# Patient Record
Sex: Female | Born: 1945
Health system: Southern US, Community
[De-identification: ages and names within clinical notes are randomized; demographics above are authoritative.]

## PROBLEM LIST (undated history)

## (undated) DIAGNOSIS — I350 Nonrheumatic aortic (valve) stenosis: Secondary | ICD-10-CM

## (undated) DIAGNOSIS — Z803 Family history of malignant neoplasm of breast: Secondary | ICD-10-CM

## (undated) DIAGNOSIS — F419 Anxiety disorder, unspecified: Secondary | ICD-10-CM

## (undated) DIAGNOSIS — E785 Hyperlipidemia, unspecified: Secondary | ICD-10-CM

## (undated) DIAGNOSIS — E559 Vitamin D deficiency, unspecified: Secondary | ICD-10-CM

## (undated) DIAGNOSIS — A6000 Herpesviral infection of urogenital system, unspecified: Secondary | ICD-10-CM

## (undated) DIAGNOSIS — M7120 Synovial cyst of popliteal space [Baker], unspecified knee: Secondary | ICD-10-CM

## (undated) DIAGNOSIS — M1990 Unspecified osteoarthritis, unspecified site: Secondary | ICD-10-CM

## (undated) DIAGNOSIS — K259 Gastric ulcer, unspecified as acute or chronic, without hemorrhage or perforation: Secondary | ICD-10-CM

## (undated) DIAGNOSIS — Z9981 Dependence on supplemental oxygen: Secondary | ICD-10-CM

## (undated) DIAGNOSIS — K439 Ventral hernia without obstruction or gangrene: Secondary | ICD-10-CM

## (undated) DIAGNOSIS — T4145XA Adverse effect of unspecified anesthetic, initial encounter: Secondary | ICD-10-CM

## (undated) DIAGNOSIS — M797 Fibromyalgia: Secondary | ICD-10-CM

## (undated) DIAGNOSIS — R7302 Impaired glucose tolerance (oral): Secondary | ICD-10-CM

## (undated) DIAGNOSIS — J189 Pneumonia, unspecified organism: Secondary | ICD-10-CM

## (undated) DIAGNOSIS — M8580 Other specified disorders of bone density and structure, unspecified site: Secondary | ICD-10-CM

## (undated) DIAGNOSIS — I10 Essential (primary) hypertension: Secondary | ICD-10-CM

## (undated) DIAGNOSIS — K7460 Unspecified cirrhosis of liver: Secondary | ICD-10-CM

## (undated) DIAGNOSIS — R748 Abnormal levels of other serum enzymes: Secondary | ICD-10-CM

## (undated) DIAGNOSIS — E039 Hypothyroidism, unspecified: Secondary | ICD-10-CM

## (undated) DIAGNOSIS — I2510 Atherosclerotic heart disease of native coronary artery without angina pectoris: Secondary | ICD-10-CM

## (undated) DIAGNOSIS — Z9109 Other allergy status, other than to drugs and biological substances: Secondary | ICD-10-CM

## (undated) DIAGNOSIS — Z8481 Family history of carrier of genetic disease: Secondary | ICD-10-CM

## (undated) DIAGNOSIS — B009 Herpesviral infection, unspecified: Secondary | ICD-10-CM

## (undated) DIAGNOSIS — J449 Chronic obstructive pulmonary disease, unspecified: Secondary | ICD-10-CM

## (undated) DIAGNOSIS — E119 Type 2 diabetes mellitus without complications: Secondary | ICD-10-CM

## (undated) DIAGNOSIS — G4730 Sleep apnea, unspecified: Secondary | ICD-10-CM

## (undated) DIAGNOSIS — R011 Cardiac murmur, unspecified: Secondary | ICD-10-CM

## (undated) DIAGNOSIS — K5790 Diverticulosis of intestine, part unspecified, without perforation or abscess without bleeding: Secondary | ICD-10-CM

## (undated) DIAGNOSIS — R0600 Dyspnea, unspecified: Secondary | ICD-10-CM

## (undated) DIAGNOSIS — K635 Polyp of colon: Secondary | ICD-10-CM

## (undated) DIAGNOSIS — C679 Malignant neoplasm of bladder, unspecified: Secondary | ICD-10-CM

## (undated) DIAGNOSIS — S83249A Other tear of medial meniscus, current injury, unspecified knee, initial encounter: Secondary | ICD-10-CM

## (undated) DIAGNOSIS — C50911 Malignant neoplasm of unspecified site of right female breast: Secondary | ICD-10-CM

## (undated) DIAGNOSIS — E7800 Pure hypercholesterolemia, unspecified: Secondary | ICD-10-CM

## (undated) HISTORY — DX: Herpesviral infection, unspecified: B00.9

## (undated) HISTORY — DX: Cardiac murmur, unspecified: R01.1

## (undated) HISTORY — DX: Family history of carrier of genetic disease: Z84.81

## (undated) HISTORY — DX: Essential (primary) hypertension: I10

## (undated) HISTORY — DX: Diverticulosis of intestine, part unspecified, without perforation or abscess without bleeding: K57.90

## (undated) HISTORY — DX: Gastric ulcer, unspecified as acute or chronic, without hemorrhage or perforation: K25.9

## (undated) HISTORY — DX: Polyp of colon: K63.5

## (undated) HISTORY — DX: Unspecified cirrhosis of liver: K74.60

## (undated) HISTORY — DX: Abnormal levels of other serum enzymes: R74.8

## (undated) HISTORY — DX: Herpesviral infection of urogenital system, unspecified: A60.00

## (undated) HISTORY — DX: Vitamin D deficiency, unspecified: E55.9

## (undated) HISTORY — DX: Pneumonia, unspecified organism: J18.9

## (undated) HISTORY — DX: Type 2 diabetes mellitus without complications: E11.9

## (undated) HISTORY — DX: Chronic obstructive pulmonary disease, unspecified: J44.9

## (undated) HISTORY — DX: Nonrheumatic aortic (valve) stenosis: I35.0

## (undated) HISTORY — DX: Impaired glucose tolerance (oral): R73.02

## (undated) HISTORY — PX: EYE SURGERY: SHX253

## (undated) HISTORY — DX: Other specified disorders of bone density and structure, unspecified site: M85.80

## (undated) HISTORY — PX: CATARACT EXTRACTION, BILATERAL: SHX1313

## (undated) HISTORY — DX: Family history of malignant neoplasm of breast: Z80.3

## (undated) HISTORY — PX: TYMPANOSTOMY TUBE PLACEMENT: SHX32

## (undated) HISTORY — PX: CARDIAC CATHETERIZATION: SHX172

## (undated) HISTORY — PX: HERNIA REPAIR: SHX51

## (undated) HISTORY — PX: COLONOSCOPY: SHX174

## (undated) HISTORY — DX: Pure hypercholesterolemia, unspecified: E78.00

---

## 1990-06-30 DIAGNOSIS — T8859XA Other complications of anesthesia, initial encounter: Secondary | ICD-10-CM

## 1990-06-30 DIAGNOSIS — C50911 Malignant neoplasm of unspecified site of right female breast: Secondary | ICD-10-CM

## 1990-06-30 HISTORY — PX: MASTECTOMY: SHX3

## 1990-06-30 HISTORY — DX: Malignant neoplasm of unspecified site of right female breast: C50.911

## 1990-06-30 HISTORY — DX: Other complications of anesthesia, initial encounter: T88.59XA

## 1990-06-30 HISTORY — PX: BREAST BIOPSY: SHX20

## 1998-02-20 ENCOUNTER — Encounter: Payer: Self-pay | Admitting: Family Medicine

## 1998-02-20 ENCOUNTER — Ambulatory Visit: Admission: RE | Admit: 1998-02-20 | Discharge: 1998-02-20 | Payer: Self-pay | Admitting: Family Medicine

## 2000-07-09 ENCOUNTER — Encounter: Payer: Self-pay | Admitting: Family Medicine

## 2000-07-09 ENCOUNTER — Encounter: Admission: RE | Admit: 2000-07-09 | Discharge: 2000-07-09 | Payer: Self-pay | Admitting: Family Medicine

## 2000-10-16 ENCOUNTER — Other Ambulatory Visit: Admission: RE | Admit: 2000-10-16 | Discharge: 2000-10-16 | Payer: Self-pay | Admitting: Family Medicine

## 2000-10-26 ENCOUNTER — Encounter: Admission: RE | Admit: 2000-10-26 | Discharge: 2000-10-26 | Payer: Self-pay | Admitting: Family Medicine

## 2000-10-26 ENCOUNTER — Encounter: Payer: Self-pay | Admitting: Family Medicine

## 2001-06-30 HISTORY — PX: UMBILICAL HERNIA REPAIR: SHX196

## 2001-06-30 HISTORY — PX: LAPAROSCOPIC CHOLECYSTECTOMY: SUR755

## 2001-07-12 ENCOUNTER — Encounter: Payer: Self-pay | Admitting: Family Medicine

## 2001-07-12 ENCOUNTER — Ambulatory Visit (HOSPITAL_COMMUNITY): Admission: RE | Admit: 2001-07-12 | Discharge: 2001-07-12 | Payer: Self-pay | Admitting: Family Medicine

## 2001-11-25 ENCOUNTER — Ambulatory Visit (HOSPITAL_COMMUNITY): Admission: RE | Admit: 2001-11-25 | Discharge: 2001-11-25 | Payer: Self-pay | Admitting: Gastroenterology

## 2001-11-25 ENCOUNTER — Encounter: Payer: Self-pay | Admitting: Gastroenterology

## 2001-12-06 ENCOUNTER — Encounter: Payer: Self-pay | Admitting: Surgery

## 2001-12-07 ENCOUNTER — Observation Stay (HOSPITAL_COMMUNITY): Admission: RE | Admit: 2001-12-07 | Discharge: 2001-12-07 | Payer: Self-pay | Admitting: Surgery

## 2001-12-07 ENCOUNTER — Encounter (INDEPENDENT_AMBULATORY_CARE_PROVIDER_SITE_OTHER): Payer: Self-pay

## 2001-12-15 ENCOUNTER — Encounter: Payer: Self-pay | Admitting: Surgery

## 2001-12-15 ENCOUNTER — Encounter: Admission: RE | Admit: 2001-12-15 | Discharge: 2001-12-15 | Payer: Self-pay | Admitting: Surgery

## 2002-02-11 ENCOUNTER — Ambulatory Visit (HOSPITAL_COMMUNITY): Admission: RE | Admit: 2002-02-11 | Discharge: 2002-02-11 | Payer: Self-pay | Admitting: Surgery

## 2002-04-11 ENCOUNTER — Encounter: Admission: RE | Admit: 2002-04-11 | Discharge: 2002-04-11 | Payer: Self-pay | Admitting: Family Medicine

## 2002-04-11 ENCOUNTER — Encounter: Payer: Self-pay | Admitting: Family Medicine

## 2002-07-18 ENCOUNTER — Encounter: Payer: Self-pay | Admitting: Family Medicine

## 2002-07-18 ENCOUNTER — Ambulatory Visit (HOSPITAL_COMMUNITY): Admission: RE | Admit: 2002-07-18 | Discharge: 2002-07-18 | Payer: Self-pay | Admitting: Family Medicine

## 2002-08-29 ENCOUNTER — Ambulatory Visit (HOSPITAL_COMMUNITY): Admission: RE | Admit: 2002-08-29 | Discharge: 2002-08-29 | Payer: Self-pay | Admitting: Gastroenterology

## 2002-11-28 ENCOUNTER — Encounter: Payer: Self-pay | Admitting: Neurosurgery

## 2002-11-28 ENCOUNTER — Encounter: Payer: Self-pay | Admitting: Radiology

## 2002-11-28 ENCOUNTER — Encounter: Admission: RE | Admit: 2002-11-28 | Discharge: 2002-11-28 | Payer: Self-pay | Admitting: Neurosurgery

## 2002-12-13 ENCOUNTER — Encounter: Admission: RE | Admit: 2002-12-13 | Discharge: 2002-12-13 | Payer: Self-pay | Admitting: Neurosurgery

## 2002-12-13 ENCOUNTER — Encounter: Payer: Self-pay | Admitting: Neurosurgery

## 2003-02-28 ENCOUNTER — Encounter: Admission: RE | Admit: 2003-02-28 | Discharge: 2003-02-28 | Payer: Self-pay | Admitting: Neurosurgery

## 2003-02-28 ENCOUNTER — Encounter: Payer: Self-pay | Admitting: Neurosurgery

## 2003-07-27 ENCOUNTER — Ambulatory Visit (HOSPITAL_COMMUNITY): Admission: RE | Admit: 2003-07-27 | Discharge: 2003-07-27 | Payer: Self-pay | Admitting: Family Medicine

## 2003-12-15 ENCOUNTER — Other Ambulatory Visit: Admission: RE | Admit: 2003-12-15 | Discharge: 2003-12-15 | Payer: Self-pay | Admitting: Family Medicine

## 2004-08-06 ENCOUNTER — Ambulatory Visit (HOSPITAL_COMMUNITY): Admission: RE | Admit: 2004-08-06 | Discharge: 2004-08-06 | Payer: Self-pay | Admitting: Family Medicine

## 2004-12-25 ENCOUNTER — Other Ambulatory Visit: Admission: RE | Admit: 2004-12-25 | Discharge: 2004-12-25 | Payer: Self-pay | Admitting: Family Medicine

## 2005-01-10 ENCOUNTER — Encounter: Admission: RE | Admit: 2005-01-10 | Discharge: 2005-04-10 | Payer: Self-pay | Admitting: Family Medicine

## 2005-08-07 ENCOUNTER — Ambulatory Visit (HOSPITAL_COMMUNITY): Admission: RE | Admit: 2005-08-07 | Discharge: 2005-08-07 | Payer: Self-pay | Admitting: Family Medicine

## 2006-08-10 ENCOUNTER — Ambulatory Visit (HOSPITAL_COMMUNITY): Admission: RE | Admit: 2006-08-10 | Discharge: 2006-08-10 | Payer: Self-pay | Admitting: Family Medicine

## 2007-08-13 ENCOUNTER — Ambulatory Visit (HOSPITAL_COMMUNITY): Admission: RE | Admit: 2007-08-13 | Discharge: 2007-08-13 | Payer: Self-pay | Admitting: Family Medicine

## 2008-08-22 ENCOUNTER — Ambulatory Visit (HOSPITAL_COMMUNITY): Admission: RE | Admit: 2008-08-22 | Discharge: 2008-08-22 | Payer: Self-pay | Admitting: Family Medicine

## 2009-05-25 ENCOUNTER — Encounter: Admission: RE | Admit: 2009-05-25 | Discharge: 2009-05-25 | Payer: Self-pay | Admitting: Family Medicine

## 2009-06-28 ENCOUNTER — Ambulatory Visit (HOSPITAL_COMMUNITY): Admission: RE | Admit: 2009-06-28 | Discharge: 2009-06-28 | Payer: Self-pay | Admitting: Gastroenterology

## 2009-08-23 ENCOUNTER — Ambulatory Visit (HOSPITAL_COMMUNITY): Admission: RE | Admit: 2009-08-23 | Discharge: 2009-08-23 | Payer: Self-pay | Admitting: Family Medicine

## 2010-05-30 LAB — HM PAP SMEAR: HM Pap smear: NORMAL

## 2010-07-18 ENCOUNTER — Other Ambulatory Visit (HOSPITAL_COMMUNITY): Payer: Self-pay | Admitting: Family Medicine

## 2010-07-18 DIAGNOSIS — M8580 Other specified disorders of bone density and structure, unspecified site: Secondary | ICD-10-CM

## 2010-07-18 DIAGNOSIS — Z0000 Encounter for general adult medical examination without abnormal findings: Secondary | ICD-10-CM

## 2010-07-18 DIAGNOSIS — Z1231 Encounter for screening mammogram for malignant neoplasm of breast: Secondary | ICD-10-CM

## 2010-07-31 LAB — HM MAMMOGRAPHY: HM Mammogram: NORMAL

## 2010-07-31 LAB — HM DEXA SCAN: HM Dexa Scan: NORMAL

## 2010-08-14 ENCOUNTER — Institutional Professional Consult (permissible substitution) (INDEPENDENT_AMBULATORY_CARE_PROVIDER_SITE_OTHER): Payer: BC Managed Care – PPO | Admitting: Family Medicine

## 2010-08-14 DIAGNOSIS — L0291 Cutaneous abscess, unspecified: Secondary | ICD-10-CM

## 2010-08-14 DIAGNOSIS — I10 Essential (primary) hypertension: Secondary | ICD-10-CM

## 2010-08-14 DIAGNOSIS — E119 Type 2 diabetes mellitus without complications: Secondary | ICD-10-CM

## 2010-08-14 DIAGNOSIS — E7800 Pure hypercholesterolemia, unspecified: Secondary | ICD-10-CM

## 2010-08-26 ENCOUNTER — Ambulatory Visit (HOSPITAL_COMMUNITY)
Admission: RE | Admit: 2010-08-26 | Discharge: 2010-08-26 | Disposition: A | Discharge status: Home / Self Care | Payer: BC Managed Care – PPO | Source: Ambulatory Visit | Attending: Family Medicine | Admitting: Family Medicine

## 2010-08-26 DIAGNOSIS — M8580 Other specified disorders of bone density and structure, unspecified site: Secondary | ICD-10-CM

## 2010-08-26 DIAGNOSIS — Z1231 Encounter for screening mammogram for malignant neoplasm of breast: Secondary | ICD-10-CM | POA: Insufficient documentation

## 2010-08-29 LAB — HM COLONOSCOPY: HM Colonoscopy: NORMAL

## 2010-10-19 ENCOUNTER — Encounter: Payer: Self-pay | Admitting: Family Medicine

## 2010-11-08 ENCOUNTER — Ambulatory Visit: Payer: BC Managed Care – PPO

## 2010-11-08 DIAGNOSIS — R7302 Impaired glucose tolerance (oral): Secondary | ICD-10-CM

## 2010-11-08 LAB — HEPATIC FUNCTION PANEL
ALT: 34 U/L (ref 0–35)
AST: 37 U/L (ref 0–37)
Albumin: 4.2 g/dL (ref 3.5–5.2)
Alkaline Phosphatase: 54 U/L (ref 39–117)
Bilirubin, Direct: 0.2 mg/dL (ref 0.0–0.3)
Indirect Bilirubin: 0.5 mg/dL (ref 0.0–0.9)
Total Bilirubin: 0.7 mg/dL (ref 0.3–1.2)
Total Protein: 6.8 g/dL (ref 6.0–8.3)

## 2010-11-08 LAB — HEMOGLOBIN A1C
Hgb A1c MFr Bld: 6 % — ABNORMAL HIGH (ref <5.7)
Mean Plasma Glucose: 126 mg/dL — ABNORMAL HIGH (ref <117)

## 2010-11-08 LAB — LIPID PANEL
Cholesterol: 135 mg/dL (ref 0–200)
HDL: 37 mg/dL — ABNORMAL LOW (ref >39)
LDL Cholesterol: 75 mg/dL (ref 0–99)
Total CHOL/HDL Ratio: 3.6 Ratio
Triglycerides: 116 mg/dL (ref <150)
VLDL: 23 mg/dL (ref 0–40)

## 2010-11-08 LAB — GLUCOSE, RANDOM: Glucose, Bld: 139 mg/dL — ABNORMAL HIGH (ref 70–99)

## 2010-11-09 LAB — MICROALBUMIN / CREATININE URINE RATIO
Creatinine, Urine: 47.1 mg/dL
Microalb Creat Ratio: 10.6 mg/g (ref 0.0–30.0)
Microalb, Ur: 0.5 mg/dL (ref 0.00–1.89)

## 2010-11-11 ENCOUNTER — Telehealth: Payer: Self-pay | Admitting: Family Medicine

## 2010-11-11 ENCOUNTER — Other Ambulatory Visit: Payer: Self-pay | Admitting: *Deleted

## 2010-11-11 MED ORDER — BISOPROLOL-HYDROCHLOROTHIAZIDE 10-6.25 MG PO TABS: 1.0000 | ORAL_TABLET | Freq: Every day | ORAL | Status: DC

## 2010-11-11 MED ORDER — SIMVASTATIN 20 MG PO TABS: 20.0000 mg | ORAL_TABLET | Freq: Every day | ORAL | Status: DC

## 2010-11-11 NOTE — Telephone Encounter (Signed)
Pt is requesting refill on Simvistatin 20 mg qd    And Bisoprolol - HCTZ  10/6.25 qd    Wants 90 day supply called in to [facility].

## 2010-11-13 ENCOUNTER — Encounter: Payer: Self-pay | Admitting: Family Medicine

## 2010-11-13 ENCOUNTER — Ambulatory Visit (INDEPENDENT_AMBULATORY_CARE_PROVIDER_SITE_OTHER): Payer: BC Managed Care – PPO | Admitting: Family Medicine

## 2010-11-13 ENCOUNTER — Ambulatory Visit: Payer: BC Managed Care – PPO | Admitting: Family Medicine

## 2010-11-13 VITALS — BP 132/70 | HR 68 | Ht 63.5 in | Wt 226.0 lb

## 2010-11-13 DIAGNOSIS — F17200 Nicotine dependence, unspecified, uncomplicated: Secondary | ICD-10-CM

## 2010-11-13 DIAGNOSIS — E559 Vitamin D deficiency, unspecified: Secondary | ICD-10-CM

## 2010-11-13 DIAGNOSIS — E119 Type 2 diabetes mellitus without complications: Secondary | ICD-10-CM

## 2010-11-13 DIAGNOSIS — E7800 Pure hypercholesterolemia, unspecified: Secondary | ICD-10-CM | POA: Insufficient documentation

## 2010-11-13 DIAGNOSIS — I10 Essential (primary) hypertension: Secondary | ICD-10-CM | POA: Insufficient documentation

## 2010-11-13 NOTE — Progress Notes (Addendum)
Subjective:    Patient ID: Shannon Schultz, female    DOB: 1945-08-26, 65 y.o.   MRN: [id number]  HPI Diabetes follow-up:  Blood sugars at home are running 120's-140 in the mornings.  Denies hypoglycemia.  Denies polydipsia and polyuria.  Last eye exam was 2 months ago.  Patient follows a low sugar diet and checks feet regularly without concerns.   Bought Weight Watchers books, but things have changed and plans to look into restarting the program.  Not getting regular exercise. Has lost 6 pounds since last visit 3 months ago.  Hypertension follow-up:  Blood pressures are not checked elsewhere.  Denies dizziness, headaches, chest pain.  Denies side effects of medications.  Past Medical History  Diagnosis Date  . Allergy   . Elevated cholesterol   . Hypertension   . Genital HSV   . Diverticulosis     Past Surgical History  Procedure Date  . R breast cancer mastectomy   . Hernia repair   . Colonoscopy 2009    History   Social History  . Marital Status: Married    Spouse Name: N/A    Number of Children: N/A  . Years of Education: N/A   Occupational History  . Not on file.   Social History Main Topics  . Smoking status: Current Everyday Smoker -- 1.0 packs/day for 46 years  . Smokeless tobacco: Not on file  . Alcohol Use: Yes     rarely, 1 drink per year maybe  . Drug Use: No  . Sexually Active: Not on file   Other Topics Concern  . Not on file   Social History Narrative  . No narrative on file    No family history on file.  Current outpatient prescriptions:aspirin 81 MG tablet, Take 81 mg by mouth daily.  , Disp: , Rfl: ;  bisoprolol-hydrochlorothiazide (ZIAC) 10-6.25 MG per tablet, Take 1 tablet by mouth daily., Disp: 90 tablet, Rfl: 0;  calcium carbonate (OS-CAL - DOSED IN MG OF ELEMENTAL CALCIUM) 1250 MG tablet, Take 2 tablets by mouth daily.  , Disp: , Rfl: ;  cetirizine (ZYRTEC) 10 MG tablet, Take 10 mg by mouth daily.  , Disp: , Rfl:  Cholecalciferol (VITAMIN D)  1000 UNITS capsule, Take 2,000 Units by mouth daily.  , Disp: , Rfl: ;  Multiple Vitamins-Minerals (CENTRUM SILVER PO), Take 1 tablet by mouth.  , Disp: , Rfl: ;  Omega-3 Fatty Acids (FISH OIL) 1200 MG CAPS, Take 4 capsules by mouth daily.  , Disp: , Rfl: ;  Probiotic Product (HEALTHY COLON PO), Take 1 tablet by mouth daily.  , Disp: , Rfl:  simvastatin (ZOCOR) 20 MG tablet, Take 1 tablet (20 mg total) by mouth at bedtime., Disp: 90 tablet, Rfl: 0;  valACYclovir (VALTREX) 500 MG tablet, Take 500 mg by mouth daily.  , Disp: , Rfl: ;  DISCONTD: valACYclovir (VALTREX) 1000 MG tablet, Take 1,000 mg by mouth daily.  , Disp: , Rfl: ;  fluticasone (FLONASE) 50 MCG/ACT nasal spray, 2 sprays by Nasal route daily.  , Disp: , Rfl:  DISCONTD: fish oil-omega-3 fatty acids 1000 MG capsule, Take 1,200 mg by mouth daily. , Disp: , Rfl:   Allergies  Allergen Reactions  . Iohexol      Desc: lipitore, sulfate   . Levaquin Other (See Comments)    insomnia  . Sulfa Antibiotics Other (See Comments)    Historical from mother, pt states that mother says she almost died from this drug  .  Codeine Nausea Only, Anxiety and Other (See Comments)    insomnia  . Lipitor (Atorvastatin Calcium) Rash    Review of Systems Denies chest pain, headaches, palpitations, paresthesias, URI symptoms, Cough, SOB, GI or GU complaints, skin concerns or other problems    Objective:   Physical Exam BP 132/70  Pulse 68  Ht 5' 3.5" (1.613 m)  Wt 226 lb (102.513 kg)  BMI 39.41 kg/m2  Well developed, well nourished patient, in no distress BP 132/70  Pulse 68  Ht 5' 3.5" (1.613 m)  Wt 226 lb (102.513 kg)  BMI 39.41 kg/m2 Neck: No lymphadenopathy or thyromegaly, no carotid bruit Heart:  Regular rate and rhythm, no murmurs, rubs, gallops or ectopy Lungs:  Clear bilaterally, without wheezes, rales or ronchi Abdomen:  Soft, nontender, nondistended, no hepatosplenomegaly or masses, normal bowel sounds Extremities:  No clubbing,  cyanosis or edema, 2+ pulses.  Neuro:  Alert and oriented x 3, cranial nerves grossly intact.  DTR's 2+ and symmetric.  Normal strength and sensation Back:  No spine or CVA tenderness Skin: no rashes or suspicious lesions Psych:  Normal mood, affect, hygiene and grooming, normal speech, eye contact  Lab Results  Component Value Date   CHOL 135 11/08/2010   TRIG 116 11/08/2010   HDL 37* 11/08/2010   ALT 34 11/08/2010   AST 37 11/08/2010   HGBA1C 6.0* 11/08/2010   MICROALBUR 0.50 11/08/2010        Assessment & Plan:   1. Pure hypercholesterolemia  Lipid panel  2. Type II or unspecified type diabetes mellitus without mention of complication, not stated as uncontrolled  CBC with Differential, Hemoglobin A1c, Comprehensive metabolic panel  3. Essential hypertension, benign  CBC with Differential  4. Tobacco use disorder    5. Unspecified vitamin D deficiency  Vitamin D 25 hydroxy   Patient was encouraged to quit smoking.  Discussed risks of smoking.  Instructed to start thinking about why/when patient smokes in order to come up with effective strategies to cut back or quit. Discussed available resources, including free counseling through [place] [facility], smoking cessation classes through [facility], OTC nicotine replacements, and the possibility of assistance with prescription medication if patients own strategies fail and if patient is motivated to quit.

## 2010-11-15 NOTE — Op Note (Signed)
[facility]  Patient:    Shannon, Schultz Visit Number: [id number] MRN: [id number]          Service Type: SUR Location: 3W [id number] Attending Physician:  Shelly Rubenstein Dictated by:   Abigail Miyamoto, M.D. Proc. Date: 12/07/01 Admit Date:  12/07/2001 Discharge Date: 12/07/2001                             Operative Report  PREOPERATIVE DIAGNOSES: 1. Biliary dyskinesia. 2. Umbilical hernia.  POSTOPERATIVE DIAGNOSES: 1. Biliary dyskinesia with chronic cholecystitis. 2. Umbilical hernia.  OPERATION: 1. Laparoscopic cholecystectomy. 2. Umbilical hernia repair.  SURGEON:  Abigail Miyamoto, M.D.  ASSISTANT:  Rose Phi. Maple Hudson, M.D.  ANESTHESIA:   General endotracheal anesthesia.  ESTIMATED BLOOD LOSS:  Minimal  DESCRIPTION OF PROCEDURE:  The patient was brought to the operating room and identified as Shannon Schultz.  She was placed supine on the operating room table and general anesthesia was induced.  Her abdomen was then prepped and draped in the usual sterile fashion.  Using a #15 blade a small incision was made transversely above the umbilicus.  Incision was carried down to the hernia sac which was easily identified and transected with the electrocautery.  This opened up the entrance to the peritoneal cavity.  A 0 Vicryl pursestring suture was then placed around this opening.  The Hasson port was placed through the opening and insufflation of the abdomen was begun.  Next an 11 mm port was placed in the patients epigastrium and two 5 mm ports were placed in the patients right flank under direct vision.  The gallbladder was then identified and retracted from the liver bed.  Multiple adhesions were found to be tethering to the gallbladder which appeared chronically infected. These were taken down bluntly.  The cystic duct was finally dissected out and clipped three times proximally and once distally and transected with scissors.  The cystic artery  was again identified and clipped twice proximally and once distally and transected as well.  The gallbladder was then slowly dissected free from the liver bed with the electrocautery.  The gallbladder was then completely excised from the liver bed.  Hemostasis was achieved in the liver bed with the cautery.  The gallbladder was then removed through the incision at the umbilicus.  A small adhesive band of omentum to the abdominal wall was transected with the scissors.  At this point, the abdomen was again copiously irrigated with normal saline. Hemostasis appeared to be achieved.  The 0 Vicryl at the umbilicus was then closed in place closing the fascial defect. Next a #1 Prolene suture was also used to reinforce the umbilical repair closing the hernial defect.  The skin of the umbilicus was then tacked in place with a 3-0 Vicryl suture.  All incisions were then anesthetized with 0.25% Marcaine and closed with 4-0 Monocryl subcuticular suture.  Sterile strips, gauze and tape were then applied.  The patient tolerated procedure well.  All sponge, needle and instrument counts were correct at the end of the procedure.  The patient was then extubated in the operating room and taken in stable condition to the recovery room. Dictated by:   Abigail Miyamoto, M.D. Attending Physician:  Shelly Rubenstein DD:  12/07/01 TD:  12/09/01 Job: [id number]

## 2010-11-15 NOTE — Op Note (Signed)
Shannon Schultz, [name]                            ACCOUNT NO.:  [id number]   MEDICAL RECORD NO.:  [id number]                   PATIENT TYPE:  AMB   LOCATION:  DAY                                  FACILITY:  [facility]   PHYSICIAN:  Douglas A. Magnus Ivan, M.D.           DATE OF BIRTH:  April 01, 1946   DATE OF PROCEDURE:  02/11/2002  DATE OF DISCHARGE:  02/11/2002                                 OPERATIVE REPORT   PREOPERATIVE DIAGNOSIS:  Umbilical hernia.   POSTOPERATIVE DIAGNOSIS:  Umbilical hernia.   PROCEDURE:  Umbilical hernia repair with mesh.   SURGEON:  Douglas A. Magnus Ivan, M.D.   ANESTHESIA:  General endotracheal anesthesia and 0.25% Marcaine.   ESTIMATED BLOOD LOSS:  Minimal.   FINDINGS:  The patient was found to have an umbilical hernia that was above  and separate from the incision made in the fascia for the laparoscopic  cholecystectomy.  The hernia was repaired with a piece of 6 x 6 cm Bard  Ventralex hernia patch mesh.   PROCEDURE IN DETAIL:  The patient was brought to the operating room and  identified correctly.  She was placed supine on the operating table, and  general anesthesia was induced.  Her abdomen was then prepped and draped in  the usual sterile fashion.  Using a #15 blade, a small transverse incision  made above the umbilicus through the patient's previous incision.  The  incision was carried down through to the previous fascial closure, and the  Prolene stitch was identified and found to be intact with no fascial defect  here.  The suture was removed, opening up the fascia.  A finger was inserted  into the abdominal cavity, and some adhesions were bluntly taken down.  The  patient was then found to have  fascial defect superior to this fascia  opening above the umbilicus.  The fascia was identified beneath this, and  the skin was dissected up through the area of the defect, and the hernia sac  was excised, and this fascial defect was connected with the other  fascial  defect.  At this point, the subcutaneous tissue above the fascia was then  mobilized circumferentially.  A piece of 6.4 x 6.4 cm Bard Ventralex hernia  patch mesh was brought onto the field.  The mesh was placed down through the  fascial defect and then sewn and then underlay circumferentially to the  fascia with interrupted #1 Novofil pop off sutures.  The redundant fascia  over the top of the mesh was then reapproximated with the #1 Novofils as  well.  Excellent coverage of the hernia defect appeared to be achieved.  At  this point, the subcutaneous tissue and fascia and skin were then  anesthetized with 0.25% Marcaine with epinephrine.  The wound was then  irrigated with saline.  Subcutaneous tissue was then closed with 3-0 Vicryl  suture, and the skin was closed with  a running 4-0 Vicryl.  Steri-Strips,  gauze, and tape were then applied.  The patient tolerated the procedure  well.  All sponge, needle, and instrument counts were correct at the end of  the procedure.  The patient was then extubated in the operating room and  taken in stable condition to the recovery room.                                               Douglas A. Magnus Ivan, M.D.    DAB/MEDQ  D:  02/11/2002  T:  02/13/2002  Job:  [id number]

## 2010-11-15 NOTE — Op Note (Signed)
NAME:  Shannon Schultz, Shannon Schultz                           ACCOUNT NO.:  [id number]   MEDICAL RECORD NO.:  [id number]                   PATIENT TYPE:  AMB   LOCATION:  ENDO                                 FACILITY:  [facility]   PHYSICIAN:  Anselmo Rod, M.D.               DATE OF BIRTH:  January 27, 1946   DATE OF PROCEDURE:  08/29/2002  DATE OF DISCHARGE:                                 OPERATIVE REPORT   PROCEDURE:  Colonoscopy.   ENDOSCOPIST:  Anselmo Rod, M.D.   INSTRUMENT USED:  Olympus video colonoscope.   INDICATION FOR PROCEDURE:  Guaiac-positive stools and a personal history of  breast cancer in a 65 year old white female.  Rule out colonic polyps,  masses, etc.   PREPROCEDURE PREPARATION:  Informed consent was procured from the patient.  The patient was fasted for eight hours prior to the procedure and prepped  with a bottle of magnesium citrate and a gallon of NuLytely the night prior  to the procedure.   PREPROCEDURE PHYSICAL:  VITAL SIGNS:  The patient had stable vital signs.  NECK:  Supple.  CHEST:  Clear to auscultation.  S1, S2 regular.  ABDOMEN:  Soft with normal bowel sounds.   DESCRIPTION OF PROCEDURE:  The patient was placed in the left lateral  decubitus position and sedated with 40 mg of Demerol and 4 mg of Versed  intravenously.  Once the patient was adequately sedate and maintained on low-  flow oxygen and continuous cardiac monitoring, the Olympus video colonoscope  was advanced from the rectum to the cecum and terminal ileum  without  difficulty.  There was some residual stool in the colon.  The patient had  pandiverticulosis with more prominent changes in the left colon.  There were  only a few small scattered diverticula in the right colon.  A small internal  hemorrhoid was seen on retroflexion.  The terminal ileum appeared normal.  No masses or polyps were seen.   IMPRESSION:  1. Small, nonbleeding internal hemorrhoid.  2. Diverticulosis with more prominent  changes in the left colon.  3. A normal-appearing terminal ileum.  4. Some residual stool in the colon, very small lesions could have been     missed.   RECOMMENDATIONS:  1. A high-fiber diet with liberal fluid intake has been advocated for the     patient.  2.     Repeat guaiac testing will be done on an outpatient basis and further     recommendations made as needed.  3. Repeat colorectal cancer screening has been recommended in the next five     years considering the patient's personal history of breast cancer, unless     the patient develops any abnormal symptoms in the interim.  Anselmo Rod, M.D.    JNM/MEDQ  D:  08/29/2002  T:  08/29/2002  Job:  [id number]   cc:   Talmadge Coventry, M.D.  [address]  [place]  [place] [address]  Fax: [phone]

## 2010-12-20 ENCOUNTER — Ambulatory Visit (INDEPENDENT_AMBULATORY_CARE_PROVIDER_SITE_OTHER): Payer: BC Managed Care – PPO | Admitting: Family Medicine

## 2010-12-20 ENCOUNTER — Other Ambulatory Visit: Payer: Self-pay

## 2010-12-20 ENCOUNTER — Encounter: Payer: Self-pay | Admitting: Family Medicine

## 2010-12-20 VITALS — BP 140/80 | HR 76 | Temp 97.9°F | Ht 64.0 in | Wt 230.0 lb

## 2010-12-20 DIAGNOSIS — R05 Cough: Secondary | ICD-10-CM

## 2010-12-20 DIAGNOSIS — J209 Acute bronchitis, unspecified: Secondary | ICD-10-CM

## 2010-12-20 DIAGNOSIS — R059 Cough, unspecified: Secondary | ICD-10-CM

## 2010-12-20 MED ORDER — IPRATROPIUM BROMIDE 0.02 % IN SOLN: 0.5000 mg | RESPIRATORY_TRACT | Status: DC

## 2010-12-20 MED ORDER — IPRATROPIUM BROMIDE 0.02 % IN SOLN: 0.5000 mg | Freq: Once | RESPIRATORY_TRACT | Status: DC

## 2010-12-20 MED ORDER — METHYLPREDNISOLONE SODIUM SUCC 125 MG IJ SOLR: 125.0000 mg | Freq: Once | INTRAMUSCULAR | Status: DC

## 2010-12-20 MED ORDER — ALBUTEROL SULFATE HFA 108 (90 BASE) MCG/ACT IN AERS: 2.0000 | INHALATION_SPRAY | Freq: Four times a day (QID) | RESPIRATORY_TRACT | Status: DC | PRN

## 2010-12-20 MED ORDER — ALBUTEROL SULFATE (2.5 MG/3ML) 0.083% IN NEBU: 2.5000 mg | INHALATION_SOLUTION | Freq: Once | RESPIRATORY_TRACT | Status: DC

## 2010-12-20 MED ORDER — METHYLPREDNISOLONE SODIUM SUCC 125 MG IJ SOLR
125.0000 mg | Freq: Once | INTRAMUSCULAR | Status: AC
  Administered 2010-12-20: 125 mg via INTRAMUSCULAR

## 2010-12-20 MED ORDER — AZITHROMYCIN 250 MG PO TABS: ORAL_TABLET | ORAL | Status: AC

## 2010-12-20 MED ORDER — PREDNISONE 10 MG PO KIT: PACK | ORAL | Status: DC

## 2010-12-20 NOTE — Progress Notes (Signed)
SUBJECTIVE: Patient presents accompanied by her husband with complaint of shortness of breath x 2-3 days, much worse this morning.  Began with sore throat, burning in chest/bronchioles with breathing, and cough.  Also having head congestion, postnasal drip.  Initially thought it was allergic reaction to flowers.  Mucus from nose is clear.  Cough is dry.  L>R side of her face feels somewhat numb and tingly.  This morning has increased shortness of breath.  Only slight SOB yesterday.  Patient is a smoker. Hasn't smoked in the last 2 days though. Hasn't been sick in about 3 years.  Has used inhaler in the past.  Hasn't checked sugars in last few days.  Decreased appetite.  Some nausea.  No vomiting or diarrhea.  No skin rash.  Denies fevers, but has felt a little warm.  PHYSICAL EXAM: BP 140/80  Pulse 76  Temp(Src) 97.9 F (36.6 C) (Oral)  Ht 5\' 4"  (1.626 m)  Wt 230 lb (104.327 kg)  BMI 39.48 kg/m2 HEENT:  PERRL, EOMI, conjunctiva clear.  TM's and EAC's normal.  OP normal.  Nose--normal.  Sinuses nontender.  Neck:  No lymphadenopathy or mass. Lungs: Diminished breath sounds throughout. O2 sat 88%. After first nebulizer, some improvement in breath sounds, wheezes now audible.  Oxygen saturation still 89%. Patient felt significantly better after first treatment, but still had some dyspnea with exertion (walk to bathroom). Second albuterol/atrovent neb was then given, along with SoluMedrol 125mg .  She felt even better after this treatment.  Lungs had some improvement in air movement, although still diminished, still mild wheezing.  Patient was speaking comfortably in full sentences, stating she felt much better.  O2 saturation, however, had actually dropped to 85-86% Extremities:  No clubbing, cyanosis or edema, brisk capillary refill.   CXR--PA was very dark, even after two attempts--almost unable to read.  Could see some bronchial thickening and cuffing.  Lateral view also shows this. No infiltrate  seen.   ASSESSMENT/PLAN: 1. Cough  DG Chest 2 View, methylPREDNISolone sodium succinate (SOLU-MEDROL) injection 125 mg, albuterol (PROVENTIL) (2.5 MG/3ML) 0.083% nebulizer solution 2.5 mg, albuterol (PROVENTIL) (2.5 MG/3ML) 0.083% nebulizer solution 2.5 mg, ipratropium (ATROVENT) 0.02 % nebulizer solution 0.5 mg, ipratropium (ATROVENT) 0.02 % nebulizer solution 0.5 mg, DISCONTINUED: methylPREDNISolone sodium succinate (SOLU-MEDROL) 125 MG injection, DISCONTINUED: ipratropium (ATROVENT) 0.02 % nebulizer solution 0.5 mg  2. Bronchitis, acute, with bronchospasm  azithromycin (ZITHROMAX Z-PAK) 250 MG tablet, PredniSONE 10 MG KIT, albuterol (PROVENTIL HFA;VENTOLIN HFA) 108 (90 BASE) MCG/ACT inhaler, methylPREDNISolone sodium succinate (SOLU-MEDROL) injection 125 mg, albuterol (PROVENTIL) (2.5 MG/3ML) 0.083% nebulizer solution 2.5 mg, albuterol (PROVENTIL) (2.5 MG/3ML) 0.083% nebulizer solution 2.5 mg, ipratropium (ATROVENT) 0.02 % nebulizer solution 0.5 mg, ipratropium (ATROVENT) 0.02 % nebulizer solution 0.5 mg, DISCONTINUED: methylPREDNISolone sodium succinate (SOLU-MEDROL) 125 MG injection, DISCONTINUED: ipratropium (ATROVENT) 0.02 % nebulizer solution 0.5 mg   Patient and husband needed to get home to take his mother to the doctor.  Patient was extremely hesitant about going to ER (due to mother-in-law's appt, and she was feeling better).  Therefore, we decided that since she clinically felt better, she was in no distress, not tachypneic, she could go home rather than to ER.  She would start the Z-pak, and continue with albuterol q4-6 hours.  If she felt any continued SOB, she was to go to ER either tonight or this weekend.  If feeling better, she will follow-up next week.  Expect sugars to be high due to the steroids.  Clinically she seemed to improve--unclear  if oxygen saturation monitor was perhaps inaccurate.  Patient visit was over 1.5 hours

## 2010-12-24 ENCOUNTER — Telehealth: Payer: Self-pay | Admitting: Family Medicine

## 2010-12-24 NOTE — Telephone Encounter (Signed)
Pt called had an out of body experience with the new inhaler and chest also gotten tighter.  She stopped using that inhaler and would like to get the kind she used in the past which was PRO-AIR.  She cannot drive today, but her sister can pick rx up at lunch.  Please call in to [facility] [address].  Please let pt know [phone], needs asap if possible.

## 2010-12-24 NOTE — Telephone Encounter (Signed)
Called patient, she stated that the Ventolin was making her "nervous," and that [employer] the past worked well, I called in [employer] Inhaler #1 1-2 puffs q6hr-prn with 1 refill to [facility] [address], spoke with Alex(pharmacist). Patient also stated that she is NOT experiencing any chest tightness, only wheezing since late Sunday night. She said that she can take deep breaths and feels much much better than last Friday when she saw you. Explained to her that if she begins to feel badly again or experiences any chest tightness that you will be happy to see her but if her sats remain low that you will be sending her to the ER. Patient verbalized understanding.

## 2010-12-24 NOTE — Telephone Encounter (Signed)
Patient was supposed to follow up early this week, and I don't see any visit scheduled.  We had also discussed that if she didn't see any improvement, she was to go to ED.  At this point, she is on day 4 of steroids and ABX.  If she is feeling that bad/tight, she needs to go to ER (as we had discussed at her last visit).  I'm happy to see her here first, but if her sats remain low, I will be sending her to ED anyway (for additional treatments, and better CXR, possibly needing a CT scan of chest if not improving).  It is okay to call in the Pro-Air for her, but this does not take the place of making sure that she gets seen

## 2011-01-24 NOTE — Progress Notes (Signed)
close

## 2011-02-04 ENCOUNTER — Other Ambulatory Visit: Payer: Self-pay | Admitting: Family Medicine

## 2011-02-04 NOTE — Telephone Encounter (Signed)
Looks like Dr.knapp ordered it

## 2011-02-04 NOTE — Telephone Encounter (Signed)
Is this ok?

## 2011-03-26 NOTE — Progress Notes (Signed)
dt ?

## 2011-04-18 ENCOUNTER — Other Ambulatory Visit: Payer: Self-pay | Admitting: Family Medicine

## 2011-05-16 ENCOUNTER — Other Ambulatory Visit: Payer: BC Managed Care – PPO

## 2011-05-16 DIAGNOSIS — E7800 Pure hypercholesterolemia, unspecified: Secondary | ICD-10-CM

## 2011-05-16 DIAGNOSIS — I10 Essential (primary) hypertension: Secondary | ICD-10-CM

## 2011-05-16 DIAGNOSIS — E559 Vitamin D deficiency, unspecified: Secondary | ICD-10-CM

## 2011-05-16 DIAGNOSIS — E119 Type 2 diabetes mellitus without complications: Secondary | ICD-10-CM

## 2011-05-17 LAB — CBC WITH DIFFERENTIAL/PLATELET
Basophils Absolute: 0.1 10*3/uL (ref 0.0–0.1)
Basophils Relative: 1 % (ref 0–1)
Eosinophils Absolute: 0.2 10*3/uL (ref 0.0–0.7)
Eosinophils Relative: 2 % (ref 0–5)
HCT: 48.4 % — ABNORMAL HIGH (ref 36.0–46.0)
Hemoglobin: 15.8 g/dL — ABNORMAL HIGH (ref 12.0–15.0)
Lymphocytes Relative: 31 % (ref 12–46)
Lymphs Abs: 2.3 10*3/uL (ref 0.7–4.0)
MCH: 31.2 pg (ref 26.0–34.0)
MCHC: 32.6 g/dL (ref 30.0–36.0)
MCV: 95.7 fL (ref 78.0–100.0)
Monocytes Absolute: 0.4 10*3/uL (ref 0.1–1.0)
Monocytes Relative: 5 % (ref 3–12)
Neutro Abs: 4.5 10*3/uL (ref 1.7–7.7)
Neutrophils Relative %: 61 % (ref 43–77)
Platelets: 162 10*3/uL (ref 150–400)
RBC: 5.06 MIL/uL (ref 3.87–5.11)
RDW: 14.5 % (ref 11.5–15.5)
WBC: 7.4 10*3/uL (ref 4.0–10.5)

## 2011-05-17 LAB — HEMOGLOBIN A1C
Hgb A1c MFr Bld: 6 % — ABNORMAL HIGH (ref <5.7)
Mean Plasma Glucose: 126 mg/dL — ABNORMAL HIGH (ref <117)

## 2011-05-17 LAB — VITAMIN D 25 HYDROXY (VIT D DEFICIENCY, FRACTURES): Vit D, 25-Hydroxy: 69 ng/mL (ref 30–89)

## 2011-05-17 LAB — COMPREHENSIVE METABOLIC PANEL
ALT: 45 U/L — ABNORMAL HIGH (ref 0–35)
AST: 41 U/L — ABNORMAL HIGH (ref 0–37)
Albumin: 4.4 g/dL (ref 3.5–5.2)
Alkaline Phosphatase: 54 U/L (ref 39–117)
BUN: 19 mg/dL (ref 6–23)
CO2: 28 mEq/L (ref 19–32)
Calcium: 9.8 mg/dL (ref 8.4–10.5)
Chloride: 104 mEq/L (ref 96–112)
Creat: 0.81 mg/dL (ref 0.50–1.10)
Glucose, Bld: 122 mg/dL — ABNORMAL HIGH (ref 70–99)
Potassium: 4.9 mEq/L (ref 3.5–5.3)
Sodium: 142 mEq/L (ref 135–145)
Total Bilirubin: 0.5 mg/dL (ref 0.3–1.2)
Total Protein: 6.6 g/dL (ref 6.0–8.3)

## 2011-05-17 LAB — LIPID PANEL
Cholesterol: 128 mg/dL (ref 0–200)
HDL: 41 mg/dL (ref >39)
LDL Cholesterol: 68 mg/dL (ref 0–99)
Total CHOL/HDL Ratio: 3.1 Ratio
Triglycerides: 96 mg/dL (ref <150)
VLDL: 19 mg/dL (ref 0–40)

## 2011-05-28 ENCOUNTER — Encounter: Payer: Self-pay | Admitting: Family Medicine

## 2011-05-28 ENCOUNTER — Ambulatory Visit (INDEPENDENT_AMBULATORY_CARE_PROVIDER_SITE_OTHER): Payer: BC Managed Care – PPO | Admitting: Family Medicine

## 2011-05-28 DIAGNOSIS — F17200 Nicotine dependence, unspecified, uncomplicated: Secondary | ICD-10-CM

## 2011-05-28 DIAGNOSIS — E7800 Pure hypercholesterolemia, unspecified: Secondary | ICD-10-CM

## 2011-05-28 DIAGNOSIS — R748 Abnormal levels of other serum enzymes: Secondary | ICD-10-CM

## 2011-05-28 DIAGNOSIS — E119 Type 2 diabetes mellitus without complications: Secondary | ICD-10-CM

## 2011-05-28 DIAGNOSIS — Z0000 Encounter for general adult medical examination without abnormal findings: Secondary | ICD-10-CM

## 2011-05-28 DIAGNOSIS — E559 Vitamin D deficiency, unspecified: Secondary | ICD-10-CM | POA: Insufficient documentation

## 2011-05-28 DIAGNOSIS — I10 Essential (primary) hypertension: Secondary | ICD-10-CM

## 2011-05-28 DIAGNOSIS — Z23 Encounter for immunization: Secondary | ICD-10-CM

## 2011-05-28 LAB — POCT URINALYSIS DIPSTICK
Bilirubin, UA: NEGATIVE
Blood, UA: NEGATIVE
Glucose, UA: NEGATIVE
Ketones, UA: NEGATIVE
Leukocytes, UA: NEGATIVE
Nitrite, UA: NEGATIVE
Protein, UA: NEGATIVE
Spec Grav, UA: 1.015
Urobilinogen, UA: NEGATIVE
pH, UA: 5

## 2011-05-28 NOTE — Progress Notes (Signed)
Shannon Schultz is a 65 y.o. female who presents for a complete physical.  She has the following concerns: Hit her left foot on the edge of the dresser yesterday.  Didn't bother her too much during the day until last night.  Last night was in a lot of pain, started swelling and throbbing. Feeling better today  She is also here for a med check.  Had labs done last week.  Diabetes follow-up:  Blood sugars at home are running 110-130 (one time high was 157) in the mornings.  Denies hypoglycemia.  Denies polydipsia and polyuria.  Last eye exam was March 2012.  Patient follows a low sugar diet and checks feet regularly without concerns. Hyperlipidemia follow-up:  Patient is reportedly following a low-fat, low cholesterol diet.  Compliant with medications and denies medication side effects Hypertension:  Denies headaches, dizziness, chest pain, edema.  Compliant with her meds  Immunization History  Administered Date(s) Administered  . Influenza Split 05/28/2011  . Pneumococcal Conjugate 12/15/2004  . Tdap 02/23/2008  . Zoster 05/07/2010   Last Pap smear: 05/30/2010 Last mammogram: 07/2010 Last colonoscopy: 08/2010 Last DEXA: 07/2010 Dentist: once yearly Ophtho: 08/2010 Exercise:  Minimal.  She plans to join [employer] again  Past Medical History  Diagnosis Date  . Allergy   . Elevated cholesterol   . Hypertension   . Genital HSV     gets on hip  . Diverticulosis   . 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 Vitamin D deficiency disease   . Osteopenia   . Migraine   . Elevated liver enzymes     fatty liver per ultrasound per pt  . Impaired glucose tolerance   . Colon polyp     Past Surgical History  Procedure Date  . Mastectomy 1992    R breast for cancer  . Colonoscopy 2009, 08/2010    Dr. Loreta Ave  . Cholecystectomy 2003  . Hernia repair 2003    umbilical    History   Social History  . Marital Status: Married    Spouse Name: N/A    Number of Children: 3  . Years of Education: N/A    Occupational History  . Retired    Social History Main Topics  . Smoking status: Current Everyday Smoker -- 1.0 packs/day for 46 years  . Smokeless tobacco: Not on file   Comment: cut back to a little over 1/2 PPD  . Alcohol Use: Yes     rarely, 1 drink per year maybe  . Drug Use: No  . Sexually Active: Yes -- Female partner(s)   Other Topics Concern  . Not on file   Social History Narrative   Lives with her husband.  Children all live in [place] nearby. No pets    Family History  Problem Relation Age of Onset  . Heart disease Father   . Diabetes Father   . Hypertension Father   . Asthma Sister   . Heart disease Mother     tachycardia  . Asthma Sister   . Cancer Sister 79    breast cancer   Current Outpatient Prescriptions on File Prior to Visit  Medication Sig Dispense Refill  . aspirin 81 MG tablet Take 81 mg by mouth daily.        . bisoprolol-hydrochlorothiazide (ZIAC) 10-6.25 MG per tablet TAKE 1 TABLET DAILY  90 tablet  1  . cetirizine (ZYRTEC) 10 MG tablet Take 10 mg by mouth daily.        Marland Kitchen  Cholecalciferol (VITAMIN D) 1000 UNITS capsule Take 1,000 Units by mouth daily.       . Multiple Vitamins-Minerals (CENTRUM SILVER PO) Take 1 tablet by mouth.        . Omega-3 Fatty Acids (FISH OIL) 1200 MG CAPS Take 2 capsules by mouth daily.       . Probiotic Product (HEALTHY COLON PO) Take 1 tablet by mouth daily.        . simvastatin (ZOCOR) 20 MG tablet TAKE 1 TABLET AT BEDTIME  90 tablet  1  . fluticasone (FLONASE) 50 MCG/ACT nasal spray 2 sprays by Nasal route daily.         Current Facility-Administered Medications on File Prior to Visit  Medication Dose Route Frequency Provider Last Rate Last Dose  . albuterol (PROVENTIL) (2.5 MG/3ML) 0.083% nebulizer solution 2.5 mg  2.5 mg Nebulization Once Debbrah Alar, CMA      . albuterol (PROVENTIL) (2.5 MG/3ML) 0.083% nebulizer solution 2.5 mg  2.5 mg Nebulization Once Debbrah Alar, CMA      . ipratropium (ATROVENT) 0.02 %  nebulizer solution 0.5 mg  0.5 mg Nebulization Once Debbrah Alar, CMA      . ipratropium (ATROVENT) 0.02 % nebulizer solution 0.5 mg  0.5 mg Nebulization Once Debbrah Alar, CMA        Allergies  Allergen Reactions  . Iohexol Other (See Comments)    unknown Desc: lipitore, sulfate  . Levaquin Other (See Comments)    insomnia  . Sulfa Antibiotics Other (See Comments)    Historical from mother, pt states that mother says she almost died from this drug  . Codeine Nausea Only, Anxiety and Other (See Comments)    insomnia  . Lipitor (Atorvastatin Calcium) Rash   ROS:  The patient denies anorexia, fever, weight changes, headaches,  vision changes, decreased hearing, ear pain, sore throat, breast concerns, chest pain, palpitations, dizziness, syncope, dyspnea on exertion, cough, swelling, nausea, vomiting, diarrhea, constipation, abdominal pain, melena, hematochezia, indigestion/heartburn, hematuria, incontinence, dysuria, vaginal bleeding, discharge, odor or itch, genital lesions, joint pains (other than toe injury yesterday), numbness, tingling, weakness, tremor, suspicious skin lesions, depression, anxiety, abnormal bleeding/bruising, or enlarged lymph nodes.  PHYSICAL EXAMINATION: BP 120/68  Pulse 64  Ht 5\' 4"  (1.626 m)  Wt 236 lb (107.049 kg)  BMI 40.51 kg/m2  General Appearance:    Alert, cooperative, no distress, appears stated age  Head:    Normocephalic, without obvious abnormality, atraumatic  Eyes:    PERRL, conjunctiva/corneas clear, EOM's intact, fundi    benign  Ears:    Normal TM's and external ear canals  Nose:   Nares normal, mucosa normal, no drainage or sinus   tenderness  Throat:   Lips, mucosa, and tongue normal; teeth and gums normal  Neck:   Supple, no lymphadenopathy;  thyroid:  no   enlargement/tenderness/nodules; no carotid   bruit or JVD  Back:    Spine nontender, no curvature, ROM normal, no CVA     tenderness  Lungs:     Clear to auscultation bilaterally  without wheezes, rales or     ronchi; respirations unlabored  Chest Wall:    No tenderness or deformity   Heart:    Regular rate and rhythm, S1 and S2 normal, no murmur, rub   or gallop  Breast Exam:    No tenderness, masses, or nipple discharge or inversion.      No axillary lymphadenopathy.  Right breast is absent.  Abdomen:     Soft, non-tender,  nondistended, normoactive bowel sounds,    no masses, no hepatosplenomegaly. + abdominal obesity  Genitalia:    Normal external genitalia without lesions.  BUS and vagina normal; No cervical motion tenderness. No abnormal vaginal discharge.  Uterus and adnexa not enlarged, nontender, no masses.  Pap not performed  Rectal:    Normal tone, no masses or tenderness; guaiac negative stool  Extremities:   No clubbing, cyanosis. 1+ edema pretibially.  Minimal tenderness at 3rd toe at proximal phalanx.  No pain with palpation at MTP or with movement of toe. No significant STS noted  Pulses:   2+ and symmetric all extremities  Skin:   Skin color, texture, turgor normal, no rashes or lesions. Venous stasis changes bilateral lower legs with some prominent superficial veins in feet giving bluish discoloration.  Lymph nodes:   Cervical, supraclavicular, and axillary nodes normal  Neurologic:   CNII-XII intact, normal strength, sensation and gait; reflexes 2+ and symmetric throughout          Psych:   Normal mood, affect, hygiene and grooming.    Labs: Lab Results  Component Value Date   HGBA1C 6.0* 05/16/2011   Lab Results  Component Value Date   CHOL 128 05/16/2011   HDL 41 05/16/2011   LDLCALC 68 05/16/2011   TRIG 96 05/16/2011   CHOLHDL 3.1 05/16/2011     Chemistry      Component Value Date/Time   NA 142 05/16/2011 0844   K 4.9 05/16/2011 0844   CL 104 05/16/2011 0844   CO2 28 05/16/2011 0844   BUN 19 05/16/2011 0844   CREATININE 0.81 05/16/2011 0844      Component Value Date/Time   CALCIUM 9.8 05/16/2011 0844   ALKPHOS 54 05/16/2011 0844    AST 41* 05/16/2011 0844   ALT 45* 05/16/2011 0844   BILITOT 0.5 05/16/2011 0844     Lab Results  Component Value Date   WBC 7.4 05/16/2011   HGB 15.8* 05/16/2011   HCT 48.4* 05/16/2011   MCV 95.7 05/16/2011   PLT 162 05/16/2011   Vitamin D-OH 69  ASSESSMENT/PLAN:  1. Routine general medical examination at a health care facility  POCT Urinalysis Dipstick, Visual acuity screening  2. Need for prophylactic vaccination and inoculation against influenza  Flu vaccine greater than or equal to 3yo preservative free IM  3. Pure hypercholesterolemia    4. Type II or unspecified type diabetes mellitus without mention of complication, not stated as uncontrolled    5. Essential hypertension, benign    6. Tobacco use disorder    7. Vitamin D deficiency    8. Need for pneumococcal vaccination  Pneumococcal polysaccharide vaccine 23-valent greater than or equal to 2yo subcutaneous/IM   Discussed the need to quit smoking.  She is not yet ready, although she has been cutting back. Discussed the need to exercise daily, and to lose weight.  She plans to start [employer] with a friend, and to restart on the Weight [employer] program through her books.  Mildly elevated LFT's--patient states she had eval in past, and has fatty liver (LFT's were normal on last check here) Vitamin D deficiency--resolved.  Continue current supplements. HTN:  Well controlled Hyperlipidemia:  Controlled Diabetes--diet controlled  Discussed monthly self breast exams and yearly mammograms after the age of 36; at least 30 minutes of aerobic activity at least 5 days/week; proper sunscreen use reviewed; healthy diet, including goals of calcium and vitamin D intake and alcohol recommendations (less than or equal to 1 drink/day)  reviewed; regular seatbelt use; changing batteries in smoke detectors.  Immunization recommendations discussed--pneumovax and flu shot given today.  Colonoscopy recommendations reviewed--UTD.   F/u 6 months  with labs prior

## 2011-05-28 NOTE — Patient Instructions (Signed)

## 2011-06-04 ENCOUNTER — Telehealth: Payer: Self-pay | Admitting: Family Medicine

## 2011-06-04 MED ORDER — ALBUTEROL SULFATE HFA 108 (90 BASE) MCG/ACT IN AERS: 2.0000 | INHALATION_SPRAY | Freq: Four times a day (QID) | RESPIRATORY_TRACT | Status: DC | PRN

## 2011-06-04 NOTE — Telephone Encounter (Signed)
Advise pt inhaler Rx was sent.  If worsening symptoms, she will need appointment.  She must quit smoking!!!  Also, Mucinex may help with her chest congestion

## 2011-06-04 NOTE — Telephone Encounter (Signed)
Spoke with patient and let her know that her rx for ProAir was called into the pharmacy. Also let her know that she could try OTC Mucinex for the chest congestion and to schedule appointment if worsening symptoms, also advised patient to quit smoking!

## 2011-06-06 ENCOUNTER — Ambulatory Visit (INDEPENDENT_AMBULATORY_CARE_PROVIDER_SITE_OTHER): Payer: BC Managed Care – PPO | Admitting: Family Medicine

## 2011-06-06 ENCOUNTER — Other Ambulatory Visit: Payer: Self-pay

## 2011-06-06 ENCOUNTER — Other Ambulatory Visit (HOSPITAL_COMMUNITY): Payer: BC Managed Care – PPO

## 2011-06-06 ENCOUNTER — Emergency Department (HOSPITAL_COMMUNITY): Payer: BC Managed Care – PPO

## 2011-06-06 ENCOUNTER — Encounter: Payer: Self-pay | Admitting: Family Medicine

## 2011-06-06 ENCOUNTER — Encounter (HOSPITAL_COMMUNITY): Payer: Self-pay | Admitting: Emergency Medicine

## 2011-06-06 ENCOUNTER — Inpatient Hospital Stay (HOSPITAL_COMMUNITY)
Admission: EM | Admit: 2011-06-06 | Discharge: 2011-06-11 | Disposition: A | Discharge status: Home Health | Payer: BC Managed Care – PPO | Source: Ambulatory Visit | Attending: Internal Medicine | Admitting: Internal Medicine

## 2011-06-06 ENCOUNTER — Other Ambulatory Visit (HOSPITAL_COMMUNITY): Payer: Self-pay | Admitting: Respiratory Therapy

## 2011-06-06 ENCOUNTER — Ambulatory Visit (HOSPITAL_COMMUNITY)
Admission: RE | Admit: 2011-06-06 | Discharge: 2011-06-06 | Disposition: A | Discharge status: Home / Self Care | Payer: BC Managed Care – PPO | Source: Ambulatory Visit | Attending: Family Medicine | Admitting: Family Medicine

## 2011-06-06 VITALS — BP 120/76 | HR 106 | Temp 98.4°F | Wt 229.0 lb

## 2011-06-06 DIAGNOSIS — E119 Type 2 diabetes mellitus without complications: Secondary | ICD-10-CM

## 2011-06-06 DIAGNOSIS — Z720 Tobacco use: Secondary | ICD-10-CM

## 2011-06-06 DIAGNOSIS — E7800 Pure hypercholesterolemia, unspecified: Secondary | ICD-10-CM | POA: Diagnosis present

## 2011-06-06 DIAGNOSIS — E559 Vitamin D deficiency, unspecified: Secondary | ICD-10-CM | POA: Diagnosis present

## 2011-06-06 DIAGNOSIS — I10 Essential (primary) hypertension: Secondary | ICD-10-CM | POA: Diagnosis present

## 2011-06-06 DIAGNOSIS — R7402 Elevation of levels of lactic acid dehydrogenase (LDH): Secondary | ICD-10-CM | POA: Diagnosis present

## 2011-06-06 DIAGNOSIS — E871 Hypo-osmolality and hyponatremia: Secondary | ICD-10-CM | POA: Diagnosis present

## 2011-06-06 DIAGNOSIS — F411 Generalized anxiety disorder: Secondary | ICD-10-CM | POA: Diagnosis present

## 2011-06-06 DIAGNOSIS — J440 Chronic obstructive pulmonary disease with acute lower respiratory infection: Secondary | ICD-10-CM | POA: Diagnosis present

## 2011-06-06 DIAGNOSIS — F17200 Nicotine dependence, unspecified, uncomplicated: Secondary | ICD-10-CM | POA: Diagnosis present

## 2011-06-06 DIAGNOSIS — J9600 Acute respiratory failure, unspecified whether with hypoxia or hypercapnia: Secondary | ICD-10-CM | POA: Diagnosis present

## 2011-06-06 DIAGNOSIS — J209 Acute bronchitis, unspecified: Secondary | ICD-10-CM | POA: Diagnosis present

## 2011-06-06 DIAGNOSIS — J449 Chronic obstructive pulmonary disease, unspecified: Secondary | ICD-10-CM

## 2011-06-06 DIAGNOSIS — J189 Pneumonia, unspecified organism: Secondary | ICD-10-CM

## 2011-06-06 DIAGNOSIS — J441 Chronic obstructive pulmonary disease with (acute) exacerbation: Principal | ICD-10-CM | POA: Diagnosis present

## 2011-06-06 DIAGNOSIS — J9620 Acute and chronic respiratory failure, unspecified whether with hypoxia or hypercapnia: Secondary | ICD-10-CM | POA: Diagnosis present

## 2011-06-06 DIAGNOSIS — E785 Hyperlipidemia, unspecified: Secondary | ICD-10-CM | POA: Diagnosis present

## 2011-06-06 DIAGNOSIS — Z853 Personal history of malignant neoplasm of breast: Secondary | ICD-10-CM

## 2011-06-06 DIAGNOSIS — R0902 Hypoxemia: Secondary | ICD-10-CM | POA: Diagnosis present

## 2011-06-06 DIAGNOSIS — R7401 Elevation of levels of liver transaminase levels: Secondary | ICD-10-CM

## 2011-06-06 DIAGNOSIS — R0602 Shortness of breath: Secondary | ICD-10-CM

## 2011-06-06 DIAGNOSIS — R05 Cough: Secondary | ICD-10-CM

## 2011-06-06 DIAGNOSIS — Z9981 Dependence on supplemental oxygen: Secondary | ICD-10-CM

## 2011-06-06 DIAGNOSIS — R059 Cough, unspecified: Secondary | ICD-10-CM

## 2011-06-06 HISTORY — DX: Pneumonia, unspecified organism: J18.9

## 2011-06-06 LAB — COMPREHENSIVE METABOLIC PANEL
ALT: 69 U/L — ABNORMAL HIGH (ref 0–35)
AST: 86 U/L — ABNORMAL HIGH (ref 0–37)
Albumin: 3.8 g/dL (ref 3.5–5.2)
Alkaline Phosphatase: 52 U/L (ref 39–117)
BUN: 16 mg/dL (ref 6–23)
CO2: 29 mEq/L (ref 19–32)
Calcium: 9.1 mg/dL (ref 8.4–10.5)
Chloride: 91 mEq/L — ABNORMAL LOW (ref 96–112)
Creatinine, Ser: 0.68 mg/dL (ref 0.50–1.10)
GFR calc Af Amer: >90 mL/min (ref >90)
GFR calc non Af Amer: 90 mL/min — ABNORMAL LOW (ref >90)
Glucose, Bld: 133 mg/dL — ABNORMAL HIGH (ref 70–99)
Potassium: 4.2 mEq/L (ref 3.5–5.1)
Sodium: 129 mEq/L — ABNORMAL LOW (ref 135–145)
Total Bilirubin: 0.4 mg/dL (ref 0.3–1.2)
Total Protein: 7 g/dL (ref 6.0–8.3)

## 2011-06-06 LAB — BLOOD GAS, ARTERIAL
Acid-Base Excess: 2.1 mmol/L — ABNORMAL HIGH (ref 0.0–2.0)
Bicarbonate: 26.2 mEq/L — ABNORMAL HIGH (ref 20.0–24.0)
Drawn by: 12206
FIO2: 0.21 %
O2 Saturation: 75.3 %
Patient temperature: 98.6
TCO2: 27.5 mmol/L (ref 0–100)
pCO2 arterial: 41.7 mmHg (ref 35.0–45.0)
pH, Arterial: 7.415 — ABNORMAL HIGH (ref 7.350–7.400)
pO2, Arterial: 40.1 mmHg — ABNORMAL LOW (ref 80.0–100.0)

## 2011-06-06 LAB — DIFFERENTIAL
Basophils Absolute: 0 10*3/uL (ref 0.0–0.1)
Basophils Relative: 0 % (ref 0–1)
Eosinophils Absolute: 0 10*3/uL (ref 0.0–0.7)
Eosinophils Relative: 0 % (ref 0–5)
Lymphocytes Relative: 8 % — ABNORMAL LOW (ref 12–46)
Lymphs Abs: 0.7 10*3/uL (ref 0.7–4.0)
Monocytes Absolute: 0.7 10*3/uL (ref 0.1–1.0)
Monocytes Relative: 9 % (ref 3–12)
Neutro Abs: 6.8 10*3/uL (ref 1.7–7.7)
Neutrophils Relative %: 83 % — ABNORMAL HIGH (ref 43–77)

## 2011-06-06 LAB — URINE MICROSCOPIC-ADD ON

## 2011-06-06 LAB — URINALYSIS, ROUTINE W REFLEX MICROSCOPIC
Glucose, UA: NEGATIVE mg/dL
Hgb urine dipstick: NEGATIVE
Ketones, ur: 15 mg/dL — AB
Leukocytes, UA: NEGATIVE
Nitrite: NEGATIVE
Protein, ur: 30 mg/dL — AB
Specific Gravity, Urine: 1.026 (ref 1.005–1.030)
Urobilinogen, UA: 1 mg/dL (ref 0.0–1.0)
pH: 5.5 (ref 5.0–8.0)

## 2011-06-06 LAB — CBC WITH DIFFERENTIAL/PLATELET
HCT: 45.6 % (ref 36.0–46.0)
Hemoglobin: 15.2 g/dL — ABNORMAL HIGH (ref 12.0–15.0)
Lymphocytes Relative: 7 % — ABNORMAL LOW (ref 12–46)
Lymphs Abs: 0.6 10*3/uL — ABNORMAL LOW (ref 0.7–4.0)
MCH: 30.5 pg (ref 26.0–34.0)
MCHC: 33.3 g/dL (ref 30.0–36.0)
MCV: 91.3 fL (ref 78.0–100.0)
Monocytes Absolute: 0.3 10*3/uL (ref 0.1–1.0)
Monocytes Relative: 4 % (ref 3–12)
Neutro Abs: 6.8 10*3/uL (ref 1.7–7.7)
Neutrophils Relative %: 89 % — ABNORMAL HIGH (ref 43–77)
Platelets: 141 10*3/uL — ABNORMAL LOW (ref 150–400)
RBC: 4.99 MIL/uL (ref 3.87–5.11)
RDW: 14.3 % (ref 11.5–15.5)
WBC: 7.6 10*3/uL (ref 4.0–10.5)

## 2011-06-06 LAB — CBC
HCT: 44.5 % (ref 36.0–46.0)
HCT: 43.4 % (ref 36.0–46.0)
Hemoglobin: 15.5 g/dL — ABNORMAL HIGH (ref 12.0–15.0)
Hemoglobin: 15.2 g/dL — ABNORMAL HIGH (ref 12.0–15.0)
MCH: 31.4 pg (ref 26.0–34.0)
MCH: 31.1 pg (ref 26.0–34.0)
MCHC: 34.8 g/dL (ref 30.0–36.0)
MCHC: 35 g/dL (ref 30.0–36.0)
MCV: 90.1 fL (ref 78.0–100.0)
MCV: 88.9 fL (ref 78.0–100.0)
Platelets: 133 10*3/uL — ABNORMAL LOW (ref 150–400)
Platelets: 125 10*3/uL — ABNORMAL LOW (ref 150–400)
RBC: 4.94 MIL/uL (ref 3.87–5.11)
RBC: 4.88 MIL/uL (ref 3.87–5.11)
RDW: 14.5 % (ref 11.5–15.5)
RDW: 14.3 % (ref 11.5–15.5)
WBC: 8.2 10*3/uL (ref 4.0–10.5)
WBC: 6.1 10*3/uL (ref 4.0–10.5)

## 2011-06-06 LAB — CK TOTAL AND CKMB (NOT AT ARMC)
CK, MB: 4 ng/mL (ref 0.3–4.0)
Relative Index: 1.4 (ref 0.0–2.5)
Total CK: 278 U/L — ABNORMAL HIGH (ref 7–177)

## 2011-06-06 LAB — OSMOLALITY, URINE: Osmolality, Ur: 712 mOsm/kg (ref 390–1090)

## 2011-06-06 LAB — TSH: TSH: 1.087 u[IU]/mL (ref 0.350–4.500)

## 2011-06-06 LAB — GLUCOSE, CAPILLARY
Glucose-Capillary: 164 mg/dL — ABNORMAL HIGH (ref 70–99)
Glucose-Capillary: 214 mg/dL — ABNORMAL HIGH (ref 70–99)

## 2011-06-06 LAB — OSMOLALITY: Osmolality: 271 mOsm/kg — ABNORMAL LOW (ref 275–300)

## 2011-06-06 LAB — TROPONIN I: Troponin I: <0.3 ng/mL (ref <0.30)

## 2011-06-06 LAB — D-DIMER, QUANTITATIVE: D-Dimer, Quant: 0.41 ug/mL-FEU (ref 0.00–0.48)

## 2011-06-06 MED ORDER — DEXTROSE 5 % IV SOLN
500.0000 mg | INTRAVENOUS | Status: AC
  Administered 2011-06-07 – 2011-06-08 (×2): 500 mg via INTRAVENOUS
  Filled 2011-06-06 (×2): qty 500

## 2011-06-06 MED ORDER — VALACYCLOVIR HCL 500 MG PO TABS
500.0000 mg | ORAL_TABLET | Freq: Every day | ORAL | Status: DC
  Administered 2011-06-06: 500 mg via ORAL
  Filled 2011-06-06 (×2): qty 1

## 2011-06-06 MED ORDER — PREDNISONE 10 MG PO TABS
10.0000 mg | ORAL_TABLET | Freq: Every day | ORAL | Status: DC
  Administered 2011-06-07: 10 mg via ORAL
  Filled 2011-06-06 (×2): qty 1

## 2011-06-06 MED ORDER — DEXTROSE 5 % IV SOLN
1.0000 g | Freq: Once | INTRAVENOUS | Status: AC
  Administered 2011-06-06: 1 g via INTRAVENOUS
  Filled 2011-06-06: qty 10

## 2011-06-06 MED ORDER — ONDANSETRON HCL 4 MG/2ML IJ SOLN
4.0000 mg | Freq: Four times a day (QID) | INTRAMUSCULAR | Status: DC | PRN
  Administered 2011-06-09 – 2011-06-10 (×2): 4 mg via INTRAVENOUS
  Filled 2011-06-06 (×2): qty 2

## 2011-06-06 MED ORDER — SODIUM CHLORIDE 0.9 % IV SOLN
INTRAVENOUS | Status: DC
  Administered 2011-06-06 – 2011-06-07 (×4): via INTRAVENOUS

## 2011-06-06 MED ORDER — ONDANSETRON HCL 4 MG PO TABS: 4.0000 mg | ORAL_TABLET | Freq: Four times a day (QID) | ORAL | Status: DC | PRN

## 2011-06-06 MED ORDER — ACETAMINOPHEN 650 MG RE SUPP: 650.0000 mg | Freq: Four times a day (QID) | RECTAL | Status: DC | PRN

## 2011-06-06 MED ORDER — ALBUTEROL SULFATE (5 MG/ML) 0.5% IN NEBU
5.0000 mg | INHALATION_SOLUTION | Freq: Once | RESPIRATORY_TRACT | Status: AC
  Administered 2011-06-06: 5 mg via RESPIRATORY_TRACT
  Filled 2011-06-06: qty 1

## 2011-06-06 MED ORDER — CALCIUM CARBONATE-VITAMIN D 500-200 MG-UNIT PO TABS
2.0000 | ORAL_TABLET | Freq: Every day | ORAL | Status: DC
  Administered 2011-06-06 – 2011-06-11 (×6): 2 via ORAL
  Filled 2011-06-06 (×6): qty 2

## 2011-06-06 MED ORDER — THERA M PLUS PO TABS
1.0000 | ORAL_TABLET | Freq: Every day | ORAL | Status: DC
  Administered 2011-06-06 – 2011-06-11 (×6): 1 via ORAL
  Filled 2011-06-06 (×6): qty 1

## 2011-06-06 MED ORDER — MORPHINE SULFATE 2 MG/ML IJ SOLN: 2.0000 mg | INTRAMUSCULAR | Status: DC | PRN

## 2011-06-06 MED ORDER — DOCUSATE SODIUM 100 MG PO CAPS
100.0000 mg | ORAL_CAPSULE | Freq: Two times a day (BID) | ORAL | Status: DC
  Administered 2011-06-06 – 2011-06-11 (×9): 100 mg via ORAL
  Filled 2011-06-06 (×11): qty 1

## 2011-06-06 MED ORDER — OMEGA-3-ACID ETHYL ESTERS 1 G PO CAPS
1.0000 g | ORAL_CAPSULE | Freq: Every day | ORAL | Status: DC
  Administered 2011-06-06 – 2011-06-11 (×6): 1 g via ORAL
  Filled 2011-06-06 (×6): qty 1

## 2011-06-06 MED ORDER — INSULIN ASPART 100 UNIT/ML ~~LOC~~ SOLN
0.0000 [IU] | Freq: Three times a day (TID) | SUBCUTANEOUS | Status: DC
  Administered 2011-06-07: 2 [IU] via SUBCUTANEOUS
  Administered 2011-06-08: 1 [IU] via SUBCUTANEOUS
  Administered 2011-06-08: 2 [IU] via SUBCUTANEOUS
  Administered 2011-06-08: 3 [IU] via SUBCUTANEOUS
  Administered 2011-06-09: 1 [IU] via SUBCUTANEOUS
  Administered 2011-06-09: 2 [IU] via SUBCUTANEOUS
  Administered 2011-06-09: 1 [IU] via SUBCUTANEOUS
  Administered 2011-06-10 (×2): 2 [IU] via SUBCUTANEOUS
  Administered 2011-06-10 – 2011-06-11 (×3): 1 [IU] via SUBCUTANEOUS
  Filled 2011-06-06: qty 3

## 2011-06-06 MED ORDER — METHYLPREDNISOLONE SODIUM SUCC 125 MG IJ SOLR
125.0000 mg | Freq: Once | INTRAMUSCULAR | Status: AC
  Administered 2011-06-06: 125 mg via INTRAVENOUS
  Filled 2011-06-06: qty 2

## 2011-06-06 MED ORDER — DEXTROSE 5 % IV SOLN
1.0000 g | INTRAVENOUS | Status: DC
  Administered 2011-06-07 – 2011-06-09 (×3): 1 g via INTRAVENOUS
  Filled 2011-06-06 (×5): qty 10

## 2011-06-06 MED ORDER — INSULIN ASPART 100 UNIT/ML ~~LOC~~ SOLN: 0.0000 [IU] | Freq: Three times a day (TID) | SUBCUTANEOUS | Status: DC

## 2011-06-06 MED ORDER — PANTOPRAZOLE SODIUM 40 MG PO TBEC
40.0000 mg | DELAYED_RELEASE_TABLET | Freq: Every day | ORAL | Status: DC
  Administered 2011-06-06 – 2011-06-11 (×6): 40 mg via ORAL
  Filled 2011-06-06 (×6): qty 1

## 2011-06-06 MED ORDER — ZOLPIDEM TARTRATE 5 MG PO TABS
5.0000 mg | ORAL_TABLET | Freq: Every evening | ORAL | Status: DC | PRN
  Filled 2011-06-06: qty 1

## 2011-06-06 MED ORDER — AZITHROMYCIN 250 MG PO TABS
500.0000 mg | ORAL_TABLET | Freq: Once | ORAL | Status: AC
  Administered 2011-06-06: 500 mg via ORAL
  Filled 2011-06-06: qty 1

## 2011-06-06 MED ORDER — IPRATROPIUM BROMIDE 0.02 % IN SOLN
0.5000 mg | Freq: Four times a day (QID) | RESPIRATORY_TRACT | Status: DC
  Administered 2011-06-06: 0.5 mg via RESPIRATORY_TRACT
  Filled 2011-06-06: qty 2.5

## 2011-06-06 MED ORDER — IPRATROPIUM BROMIDE 0.02 % IN SOLN
0.5000 mg | Freq: Once | RESPIRATORY_TRACT | Status: AC
  Administered 2011-06-06: 0.5 mg via RESPIRATORY_TRACT
  Filled 2011-06-06: qty 2.5

## 2011-06-06 MED ORDER — ALBUTEROL SULFATE (5 MG/ML) 0.5% IN NEBU
2.5000 mg | INHALATION_SOLUTION | Freq: Four times a day (QID) | RESPIRATORY_TRACT | Status: DC
  Administered 2011-06-06: 2.5 mg via RESPIRATORY_TRACT
  Filled 2011-06-06: qty 0.5

## 2011-06-06 MED ORDER — BISOPROLOL-HYDROCHLOROTHIAZIDE 10-6.25 MG PO TABS
1.0000 | ORAL_TABLET | Freq: Every day | ORAL | Status: DC
  Administered 2011-06-07 – 2011-06-11 (×5): 1 via ORAL
  Filled 2011-06-06 (×5): qty 1

## 2011-06-06 MED ORDER — ENOXAPARIN SODIUM 40 MG/0.4ML ~~LOC~~ SOLN
40.0000 mg | SUBCUTANEOUS | Status: DC
  Administered 2011-06-06 – 2011-06-10 (×5): 40 mg via SUBCUTANEOUS
  Filled 2011-06-06 (×6): qty 0.4

## 2011-06-06 MED ORDER — NICOTINE 14 MG/24HR TD PT24
14.0000 mg | MEDICATED_PATCH | Freq: Every day | TRANSDERMAL | Status: DC
  Filled 2011-06-06 (×6): qty 1

## 2011-06-06 MED ORDER — ALBUTEROL SULFATE (5 MG/ML) 0.5% IN NEBU: 2.5000 mg | INHALATION_SOLUTION | Freq: Four times a day (QID) | RESPIRATORY_TRACT | Status: DC | PRN

## 2011-06-06 MED ORDER — ACETAMINOPHEN 325 MG PO TABS
650.0000 mg | ORAL_TABLET | Freq: Four times a day (QID) | ORAL | Status: DC | PRN
  Administered 2011-06-07 – 2011-06-10 (×4): 650 mg via ORAL
  Filled 2011-06-06 (×5): qty 2

## 2011-06-06 MED ORDER — GUAIFENESIN ER 600 MG PO TB12
1200.0000 mg | ORAL_TABLET | Freq: Two times a day (BID) | ORAL | Status: DC
  Administered 2011-06-06 – 2011-06-11 (×10): 1200 mg via ORAL
  Filled 2011-06-06 (×11): qty 2

## 2011-06-06 MED ORDER — ALBUTEROL SULFATE (5 MG/ML) 0.5% IN NEBU: 2.5000 mg | INHALATION_SOLUTION | RESPIRATORY_TRACT | Status: DC | PRN

## 2011-06-06 MED ORDER — ASPIRIN EC 81 MG PO TBEC
81.0000 mg | DELAYED_RELEASE_TABLET | Freq: Every day | ORAL | Status: DC
  Administered 2011-06-06 – 2011-06-11 (×6): 81 mg via ORAL
  Filled 2011-06-06 (×6): qty 1

## 2011-06-06 MED ORDER — ACETAMINOPHEN 325 MG PO TABS
ORAL_TABLET | ORAL | Status: AC
  Filled 2011-06-06: qty 2

## 2011-06-06 MED ORDER — SIMVASTATIN 20 MG PO TABS
20.0000 mg | ORAL_TABLET | Freq: Every day | ORAL | Status: DC
  Administered 2011-06-06 – 2011-06-10 (×5): 20 mg via ORAL
  Filled 2011-06-06 (×6): qty 1

## 2011-06-06 MED ORDER — ACETAMINOPHEN 325 MG PO TABS
650.0000 mg | ORAL_TABLET | Freq: Once | ORAL | Status: AC
  Administered 2011-06-06: 650 mg via ORAL

## 2011-06-06 NOTE — ED Notes (Signed)
Pt cbg 164 Rn notified [address]

## 2011-06-06 NOTE — ED Provider Notes (Addendum)
History     CSN: [id number] Arrival date & time: 06/06/2011 10:26 AM   First MD Initiated Contact with Patient 06/06/11 1115      Chief Complaint  Patient presents with  . Shortness of Breath    (Consider location/radiation/quality/duration/timing/severity/associated sxs/prior treatment) HPI Patient with low grade fever began Tuesday.  Fever to 100's.  Cough productive of clear sputum.  Patient with dyspnea began on Wednesday afternoon.  Called Dr's office and had inhaler called in and told she would need to be seen if not better.  Better yesterday and more sob last night.  Patient seen by Dr Susann Givens and reported sats 80%.  Patient is smoker.  Past Medical History  Diagnosis Date  . Allergy   . Elevated cholesterol   . Hypertension   . Genital HSV     gets on hip  . Diverticulosis   . 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 Vitamin D deficiency disease   . Osteopenia   . Migraine   . Elevated liver enzymes     fatty liver per ultrasound per pt  . Impaired glucose tolerance   . Colon polyp     Past Surgical History  Procedure Date  . Mastectomy 1992    R breast for cancer  . Colonoscopy 2009, 08/2010    Dr. Loreta Ave  . Cholecystectomy 2003  . Hernia repair 2003    umbilical    Family History  Problem Relation Age of Onset  . Heart disease Father   . Diabetes Father   . Hypertension Father   . Asthma Sister   . Heart disease Mother     tachycardia  . Asthma Sister   . Cancer Sister 74    breast cancer    History  Substance Use Topics  . Smoking status: Current Everyday Smoker -- 1.0 packs/day for 46 years  . Smokeless tobacco: Not on file   Comment: cut back to a little over 1/2 PPD  . Alcohol Use: Yes     rarely, 1 drink per year maybe    OB History    Grav Para Term Preterm Abortions TAB SAB Ect Mult Living                  Review of Systems  All other systems reviewed and are negative.    Allergies  Contrast media; Iohexol; Levaquin; Sulfa  antibiotics; Codeine; and Lipitor  Home Medications   Current Outpatient Rx  Name Route Sig Dispense Refill  . ALBUTEROL SULFATE HFA 108 (90 BASE) MCG/ACT IN AERS Inhalation Inhale 2 puffs into the lungs every 6 (six) hours as needed for wheezing. 18 g 0  . ASPIRIN EC 81 MG PO TBEC Oral Take 81 mg by mouth daily.      Marland Kitchen BISOPROLOL-HYDROCHLOROTHIAZIDE 10-6.25 MG PO TABS Oral Take 1 tablet by mouth daily.      Marland Kitchen CALCIUM CARBONATE-VITAMIN D 600-400 MG-UNIT PO TABS Oral Take 2 tablets by mouth daily.      Marland Kitchen CETIRIZINE HCL 10 MG PO TABS Oral Take 10 mg by mouth daily.      . CENTRUM SILVER PO Oral Take 1 tablet by mouth.      Marland Kitchen FISH OIL 1200 MG PO CAPS Oral Take 2 capsules by mouth daily.     Marland Kitchen HEALTHY COLON PO Oral Take 1 tablet by mouth daily.      Marland Kitchen SIMVASTATIN 20 MG PO TABS Oral Take 20 mg by mouth at bedtime.      Marland Kitchen  VALACYCLOVIR HCL 1 G PO TABS Oral Take 500 mg by mouth daily.     Marland Kitchen VITAMIN D (CHOLECALCIFEROL) PO Oral Take 2 capsules by mouth daily.        BP 115/57  Pulse 100  Temp(Src) 100.3 F (37.9 C) (Oral)  Resp 17  SpO2 96%  Physical Exam  Nursing note and vitals reviewed. Constitutional: She appears well-developed and well-nourished.  HENT:  Head: Normocephalic and atraumatic.  Eyes: Conjunctivae are normal. Pupils are equal, round, and reactive to light.  Neck: Normal range of motion. Neck supple.  Cardiovascular: Tachycardia present.     ED Course  Procedures (including critical care time)  Labs Reviewed - No data to display No results found.   No diagnosis found.    MDM  Discussed with Dr. Venetia Constable. She'll be admitted to team 7 to a MedSurg bed.   Patient with come increased markings ? Interstitial edema.    Date: 06/06/2011  Rate: 101  Rhythm: sinus tachycardia  QRS Axis: right  Intervals: normal  ST/T Wave abnormalities: normal  Conduction Disutrbances:poor r wave progression  Narrative Interpretation:   Old EKG Reviewed: none  available      Hilario Quarry, MD 06/06/11 1436  Hilario Quarry, MD 06/06/11 1517

## 2011-06-06 NOTE — H&P (Signed)
PCP:   Carollee Herter, MD, MD   Chief Complaint:  Cough/sob/fever for 3 days.  HPI: Shannon Schultz is a pleasant 65 year old female who is accompanied by her family at bedside, who comes in with complaints of cough,sob,fever for the past 3 days. Symptoms started with a sore throat 3 days ago, followed by fever and cough productive of clear colored sputum, then weakness and then change in skin color, headaches and weakness. She had diarrhea yesterday. No abdominal pain or dysuria. She tried OTC cough meds as well as an albuterol inhaler prescribed by her pcp with no improvement. In Ed she was found to be hypoxic, with O2 around 80% on RA, but this improved quickly with O2 supplementation and nebuliser treatments. Her ABG on room air showed 7.42/41/40/75% on arrival. She also had fever 100.76F. Patient also given ceftriaxone/zithromax and referred for admission. She feels better.  Review of Systems:  The patient denies anorexia,  weight loss, vision loss, decreased hearing,  chest pain, syncope, peripheral edema, balance deficits, hemoptysis, abdominal pain, melena, hematochezia, severe indigestion/heartburn, hematuria, incontinence, genital sores, muscle weakness, suspicious skin lesions, transient blindness, difficulty walking, depression, unusual weight change, abnormal bleeding, enlarged lymph nodes, angioedema, and breast masses.  Past Medical History: Past Medical History  Diagnosis Date  . Allergy   . Elevated cholesterol   . Hypertension   . Genital HSV     gets on hip  . Diverticulosis   . 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 Vitamin D deficiency disease   . Osteopenia   . Migraine   . Elevated liver enzymes     fatty liver per ultrasound per pt  . Impaired glucose tolerance   . Colon polyp    Past Surgical History  Procedure Date  . Mastectomy 1992    R breast for cancer  . Colonoscopy 2009, 08/2010    Dr. Loreta Ave  . Cholecystectomy 2003  . Hernia repair 2003    umbilical     Medications: Prior to Admission medications   Medication Sig Start Date End Date Taking? Authorizing Provider  albuterol (PROVENTIL HFA;VENTOLIN HFA) 108 (90 BASE) MCG/ACT inhaler Inhale 2 puffs into the lungs every 6 (six) hours as needed for wheezing. 06/04/11 06/03/12 Yes Eve Carleene Overlie, MD  aspirin EC 81 MG tablet Take 81 mg by mouth daily.     Yes Historical Provider, MD  bisoprolol-hydrochlorothiazide (ZIAC) 10-6.25 MG per tablet Take 1 tablet by mouth daily.     Yes Historical Provider, MD  Calcium Carbonate-Vitamin D (CALCIUM 600+D) 600-400 MG-UNIT per tablet Take 2 tablets by mouth daily.     Yes Historical Provider, MD  cetirizine (ZYRTEC) 10 MG tablet Take 10 mg by mouth daily.     Yes Historical Provider, MD  Multiple Vitamins-Minerals (CENTRUM SILVER PO) Take 1 tablet by mouth.     Yes Historical Provider, MD  Omega-3 Fatty Acids (FISH OIL) 1200 MG CAPS Take 2 capsules by mouth daily.    Yes Historical Provider, MD  Probiotic Product (HEALTHY COLON PO) Take 1 tablet by mouth daily.     Yes Historical Provider, MD  simvastatin (ZOCOR) 20 MG tablet Take 20 mg by mouth at bedtime.     Yes Historical Provider, MD  valACYclovir (VALTREX) 1000 MG tablet Take 500 mg by mouth daily.    Yes Historical Provider, MD  VITAMIN D, CHOLECALCIFEROL, PO Take 2 capsules by mouth daily.     Yes Historical Provider, MD  Allergies:   Allergies  Allergen Reactions  . Contrast Media (Iodinated Diagnostic Agents) Other (See Comments)    Could not breath  . Iohexol Other (See Comments)    Immediately could not breathe  . Levaquin Other (See Comments)    insomnia  . Sulfa Antibiotics Other (See Comments)    Historical from mother, pt states that mother says she almost died from this drug  . Codeine Nausea Only, Anxiety and Other (See Comments)    insomnia  . Lipitor (Atorvastatin Calcium) Rash    Social History:  reports that she has been smoking.  She does not have any smokeless tobacco  history on file. She reports that she drinks alcohol. She reports that she does not use illicit drugs.  Family History: Family History  Problem Relation Age of Onset  . Heart disease Father   . Diabetes Father   . Hypertension Father   . Asthma Sister   . Heart disease Mother     tachycardia  . Asthma Sister   . Cancer Sister 80    breast cancer    Physical Exam: Filed Vitals:   06/06/11 1045 06/06/11 1216 06/06/11 1338 06/06/11 1504  BP: 115/57 123/59  113/70  Pulse: 100 103  99  Temp:      TempSrc:      Resp: 17 22  22   SpO2: 96% 94% 93% 93%   General appearance: alert, cooperative and mild distress Head: Normocephalic, without obvious abnormality, atraumatic Throat: lips, mucosa, and tongue normal; teeth and gums normal Neck: no adenopathy, no carotid bruit, no JVD, supple, symmetrical, trachea midline and thyroid not enlarged, symmetric, no tenderness/mass/nodules Resp: Reduced air entry bilaterally. Scant wheezing. Cardio: regular rate and rhythm, S1, S2 normal, no murmur, click, rub or gallop GI: soft, non-tender; bowel sounds normal; no masses,  no organomegaly Extremities: extremities normal, atraumatic, no cyanosis or edema Pulses: 2+ and symmetric Neurologic: Grossly normal   Labs on Admission:   Basename 06/06/11 1153  NA 129*  K 4.2  CL 91*  CO2 29  GLUCOSE 133*  BUN 16  CREATININE 0.68  CALCIUM 9.1  MG --  PHOS --    Basename 06/06/11 1153  AST 86*  ALT 69*  ALKPHOS 52  BILITOT 0.4  PROT 7.0  ALBUMIN 3.8   No results found for this basename: LIPASE:2,AMYLASE:2 in the last 72 hours  Basename 06/06/11 1153 06/06/11 0934  WBC 8.2 7.6  NEUTROABS 6.8 6.8  HGB 15.5* 15.2*  HCT 44.5 45.6  MCV 90.1 91.3  PLT 133* 141*    Basename 06/06/11 1153  CKTOTAL 278*  CKMB 4.0  CKMBINDEX --  TROPONINI <0.30   No results found for this basename: TSH,T4TOTAL,FREET3,T3FREE,THYROIDAB in the last 72 hours No results found for this basename:  VITAMINB12:2,FOLATE:2,FERRITIN:2,TIBC:2,IRON:2,RETICCTPCT:2 in the last 72 hours  Radiological Exams on Admission: Dg Chest Portable 1 View  06/06/2011  *RADIOLOGY REPORT*  Clinical Data: Some breath.  PORTABLE CHEST - 1 VIEW  Comparison: None.  Findings: The chest is hyperexpanded.  Interstitial opacities predominantly in the bases are compatible with edema.  No consolidative process.  No pneumothorax or pleural effusion.  IMPRESSION:  1.  Findings compatible with mild interstitial edema. 2.  Pulmonary hyperexpansion compatible with emphysema.  Original Report Authenticated By: Bernadene Bell. Maricela Curet, M.D.    Assessment Shannon Millican has what looks like early CAP in setting of continued tobacco use. She has hyponatremia, raising concern for legionella but this may be just related to dehydration form  the diarrhea. She also has transaminitis-?etoh related(she is on statins). Plan .Pneumonia- agree with ceftriaxone /zithromax. Follow sputum c+s, urine legionella . Will give short course of steroids as there is an element of acute bronchitis. Repeat CXR after some hydration. .Tobacco abuse- smoking cessation counseling given. Nicotine patch. .HTN (hypertension)- seems controlled. Resume home meds.  .Diabetes mellitus- diet controlled. Per patient HbA1C 6 recently. Expect slight hyperglycemia with steroids. Place on SSI. Marland Kitchen transaminitis- likely etoh related, maybe element of statins. Check hepatitis panel/abdominal ultrasound, monitor. Didn't stop statin for now. . hyponatremia- expect improvement with rehydration and pna treatment, if not worry about malignancy. Dvt/gi prophylaxis. Condition guarded. Glorious Flicker [id number]. 06/06/2011, 3:17 PM

## 2011-06-06 NOTE — Progress Notes (Signed)
  Subjective:    Patient ID: Shannon Schultz, female    DOB: January 08, 1946, 65 y.o.   MRN: [id number]  HPI She states that on Tuesday she developed a sore throat followed listed by cough, shortness of breath, fever chills and myalgias. The symptoms continued and she is here today for further evaluation.   Review of Systems     Objective:   Physical Exam Alert and slightly cyanotic. Cardiac exam shows a tachycardia. Lungs are clear to auscultation. Pulse ox was 80. She was sent for arterial blood gas of which showed PO2 of 40. She was then sent to the emergency room for further evaluation and treatment.       Assessment & Plan:  Hypoxia. Deferred to the emergency room.

## 2011-06-06 NOTE — ED Notes (Signed)
Family at bedside. 

## 2011-06-06 NOTE — ED Notes (Signed)
Pt states that she has been sob since  Wed and was  Given an inhaler after calling into dr got better yesterday and then last nigh got worse and was sent today for ABG which turned out to have bad results and was brought to er. Pt states she just had pneu shot last  week at her check up

## 2011-06-06 NOTE — ED Notes (Signed)
Pt on stretcher, nad noted, abc intact, awaiting admission consult

## 2011-06-06 NOTE — ED Notes (Signed)
Complains of nausea, cbg checked and given fluids.

## 2011-06-07 ENCOUNTER — Inpatient Hospital Stay (HOSPITAL_COMMUNITY): Payer: BC Managed Care – PPO

## 2011-06-07 ENCOUNTER — Encounter (HOSPITAL_COMMUNITY): Payer: Self-pay | Admitting: *Deleted

## 2011-06-07 LAB — CBC
HCT: 43.9 % (ref 36.0–46.0)
Hemoglobin: 14.7 g/dL (ref 12.0–15.0)
MCH: 30.4 pg (ref 26.0–34.0)
MCHC: 33.5 g/dL (ref 30.0–36.0)
MCV: 90.7 fL (ref 78.0–100.0)
Platelets: 136 10*3/uL — ABNORMAL LOW (ref 150–400)
RBC: 4.84 MIL/uL (ref 3.87–5.11)
RDW: 14.3 % (ref 11.5–15.5)
WBC: 6.4 10*3/uL (ref 4.0–10.5)

## 2011-06-07 LAB — COMPREHENSIVE METABOLIC PANEL
ALT: 66 U/L — ABNORMAL HIGH (ref 0–35)
AST: 76 U/L — ABNORMAL HIGH (ref 0–37)
Albumin: 3.4 g/dL — ABNORMAL LOW (ref 3.5–5.2)
Alkaline Phosphatase: 49 U/L (ref 39–117)
BUN: 18 mg/dL (ref 6–23)
CO2: 31 mEq/L (ref 19–32)
Calcium: 8.7 mg/dL (ref 8.4–10.5)
Chloride: 97 mEq/L (ref 96–112)
Creatinine, Ser: 0.61 mg/dL (ref 0.50–1.10)
GFR calc Af Amer: >90 mL/min (ref >90)
GFR calc non Af Amer: >90 mL/min (ref >90)
Glucose, Bld: 93 mg/dL (ref 70–99)
Potassium: 4.3 mEq/L (ref 3.5–5.1)
Sodium: 135 mEq/L (ref 135–145)
Total Bilirubin: 0.3 mg/dL (ref 0.3–1.2)
Total Protein: 6.6 g/dL (ref 6.0–8.3)

## 2011-06-07 LAB — CREATININE, SERUM
Creatinine, Ser: 0.63 mg/dL (ref 0.50–1.10)
GFR calc Af Amer: >90 mL/min (ref >90)
GFR calc non Af Amer: >90 mL/min (ref >90)

## 2011-06-07 LAB — PROTIME-INR
INR: 1.03 (ref 0.00–1.49)
Prothrombin Time: 13.7 seconds (ref 11.6–15.2)

## 2011-06-07 LAB — EXPECTORATED SPUTUM ASSESSMENT W GRAM STAIN, RFLX TO RESP C

## 2011-06-07 LAB — APTT: aPTT: 34 seconds (ref 24–37)

## 2011-06-07 LAB — GLUCOSE, CAPILLARY
Glucose-Capillary: 114 mg/dL — ABNORMAL HIGH (ref 70–99)
Glucose-Capillary: 179 mg/dL — ABNORMAL HIGH (ref 70–99)
Glucose-Capillary: 83 mg/dL (ref 70–99)
Glucose-Capillary: 118 mg/dL — ABNORMAL HIGH (ref 70–99)

## 2011-06-07 LAB — PHOSPHORUS: Phosphorus: 2.9 mg/dL (ref 2.3–4.6)

## 2011-06-07 LAB — MAGNESIUM: Magnesium: 2.2 mg/dL (ref 1.5–2.5)

## 2011-06-07 LAB — EXPECTORATED SPUTUM ASSESSMENT W REFEX TO RESP CULTURE

## 2011-06-07 LAB — VITAMIN B12: Vitamin B-12: 1034 pg/mL — ABNORMAL HIGH (ref 211–911)

## 2011-06-07 MED ORDER — PREDNISONE 20 MG PO TABS
20.0000 mg | ORAL_TABLET | Freq: Every day | ORAL | Status: DC
  Administered 2011-06-08 – 2011-06-11 (×4): 20 mg via ORAL
  Filled 2011-06-07 (×5): qty 1

## 2011-06-07 NOTE — Progress Notes (Signed)
Subjective: Still having some shortness of breath, no fever overnight. Cough has improved. Patient denies chest pain, abdominal pain, nausea and vomiting. Patient reports some tingling sensation.  Objective: Vital signs in last 24 hours: Temp:  [98.1 F (36.7 C)-98.4 F (36.9 C)] 98.1 F (36.7 C) (12/08 0530) Pulse Rate:  [81-103] 81  (12/08 0530) Resp:  [17-22] 18  (12/08 0530) BP: (113-123)/(54-70) 122/67 mmHg (12/08 0530) SpO2:  [93 %-95 %] 95 % (12/08 0530) Weight:  [102.8 kg (226 lb 10.1 oz)] 226 lb 10.1 oz (102.8 kg) (12/07 2124) Weight change:  Last BM Date: 06/06/11  Intake/Output from previous day: 12/07 0701 - 12/08 0700 In: -  Out: 400 [Urine:400] Total I/O In: 120 [P.O.:120] Out: 800 [Urine:800]   Physical Exam: General: Alert, awake, oriented x3, in no acute distress. HEENT: No bruits, no goiter. Heart: Regular rate and rhythm, without murmurs, rubs, gallops. Lungs:  Scattered rhonchi, no wheezes. Abdomen: Soft, nontender, nondistended, positive bowel sounds. Extremities: No clubbing cyanosis or edema with positive pedal pulses. Neuro: Grossly intact, nonfocal.  Lab Results: Basic Metabolic Panel:  Basename 06/07/11 0615 06/06/11 2213 06/06/11 1153  NA 135 -- 129*  K 4.3 -- 4.2  CL 97 -- 91*  CO2 31 -- 29  GLUCOSE 93 -- 133*  BUN 18 -- 16  CREATININE 0.61 0.63 --  CALCIUM 8.7 -- 9.1  MG 2.2 -- --  PHOS 2.9 -- --   Liver Function Tests:  [facility] 06/07/11 0615 06/06/11 1153  AST 76* 86*  ALT 66* 69*  ALKPHOS 49 52  BILITOT 0.3 0.4  PROT 6.6 7.0  ALBUMIN 3.4* 3.8   CBC:  Basename 06/07/11 0615 06/06/11 2213 06/06/11 1153 06/06/11 0934  WBC 6.4 6.1 -- --  NEUTROABS -- -- 6.8 6.8  HGB 14.7 15.2* -- --  HCT 43.9 43.4 -- --  MCV 90.7 88.9 -- --  PLT 136* 125* -- --   Cardiac Enzymes:  Basename 06/06/11 1153  CKTOTAL 278*  CKMB 4.0  CKMBINDEX --  TROPONINI <0.30   D-Dimer:  Basename 06/06/11 1311  DDIMER 0.41   CBG:  Basename  06/07/11 0756 06/06/11 2142 06/06/11 1342  GLUCAP 114* 214* 164*   Thyroid Function Tests:  Basename 06/06/11 1520  TSH 1.087  T4TOTAL --  FREET4 --  T3FREE --  THYROIDAB --  Coagulation:  Basename 06/07/11 0615  LABPROT 13.7  INR 1.03    Misc. Labs:  Recent Results (from the past 240 hour(s))  CULTURE, BLOOD (ROUTINE X 2)     Status: Normal (Preliminary result)   Collection Time   06/06/11 11:53 AM      Component Value Range Status Comment   Specimen Description BLOOD LEFT ARM   Final    Special Requests BOTTLES DRAWN AEROBIC AND ANAEROBIC 10CC   Final    Setup Time [id number]   Final    Culture     Final    Value:        BLOOD CULTURE RECEIVED NO GROWTH TO DATE CULTURE WILL BE HELD FOR 5 DAYS BEFORE ISSUING A FINAL NEGATIVE REPORT   Report Status PENDING   Incomplete   CULTURE, BLOOD (ROUTINE X 2)     Status: Normal (Preliminary result)   Collection Time   06/06/11 12:04 PM      Component Value Range Status Comment   Specimen Description BLOOD LEFT ARM   Final    Special Requests BOTTLES DRAWN AEROBIC AND ANAEROBIC 10CC   Final  Setup Time [id number]   Final    Culture     Final    Value:        BLOOD CULTURE RECEIVED NO GROWTH TO DATE CULTURE WILL BE HELD FOR 5 DAYS BEFORE ISSUING A FINAL NEGATIVE REPORT   Report Status PENDING   Incomplete     Studies/Results: X-ray Chest Pa And Lateral   06/07/2011  *RADIOLOGY REPORT*  Clinical Data: Pneumonia.  Cough and congestion.  Short of breath.  CHEST - 2 VIEW  Comparison: 06/06/2011.  Findings: Right axillary dissection clips are present. Density is present in the right lower lobe on the lateral view, which may represent atelectasis or airspace disease.  Hyperinflation is present suggesting emphysema.  No pleural effusion is identified. Cardiopericardial silhouette appears within normal limits.  Aortic arch atherosclerosis.  Trachea midline. Compared to prior exam, pulmonary aeration is improved.  IMPRESSION: Right lower  lobe airspace disease or atelectasis. Pneumonia cannot be excluded.  Emphysema.  Original Report Authenticated By: Andreas Newport, M.D.   US Abdomen Complete  06/06/2011  *RADIOLOGY REPORT*  Clinical Data:  Elevated liver function tests.  Surgical history includes cholecystectomy.  History of breast cancer.  Prior history of hepatomegaly and splenomegaly.  COMPLETE ABDOMINAL ULTRASOUND 06/06/2011:  Comparison:  CT abdomen and pelvis 06/28/2009 [facility].  Findings:  Gallbladder:  Surgically absent.  Common bile duct:  Normal in caliber post cholecystectomy with maximum diameter approximating 7 mm.  Liver:  Enlarged with diffusely increased and coarsened echotexture without focal parenchymal abnormality.  Patent portal vein with hepatopetal flow.  IVC:  Patent.  Pancreas:  Although the pancreas is difficult to visualize in its entirety, no focal pancreatic abnormality is identified.  Spleen:  Enlarged, measuring approximately 13.3 x 16.0 x 5.1 cm, yielding a volume of approximately 563 ml.  No focal splenic parenchymal abnormality.  Right Kidney:  No hydronephrosis.  Well-preserved cortex.  No shadowing calculi.  Normal size and parenchymal echotexture without focal abnormalities.  Approximately 13.4 cm in length.  Left Kidney:  No hydronephrosis.  Well-preserved cortex.  No shadowing calculi.  Normal size and parenchymal echotexture without focal abnormalities.  Approximately 14.3 cm in length.  Abdominal aorta:  Normal in caliber throughout its visualized course in the abdomen with mild to moderate atherosclerosis.  IMPRESSION:  1.  Hepatomegaly with diffuse hepatic steatosis and/or hepatocellular disease.  No focal hepatic parenchymal abnormality. 2.  Splenomegaly without focal splenic parenchymal abnormality. 3.  Abdominal aortic atherosclerosis without aneurysm.  Original Report Authenticated By: Arnell Sieving, M.D.   Dg Chest Portable 1 View  06/06/2011  *RADIOLOGY REPORT*  Clinical Data:  Some breath.  PORTABLE CHEST - 1 VIEW  Comparison: None.  Findings: The chest is hyperexpanded.  Interstitial opacities predominantly in the bases are compatible with edema.  No consolidative process.  No pneumothorax or pleural effusion.  IMPRESSION:  1.  Findings compatible with mild interstitial edema. 2.  Pulmonary hyperexpansion compatible with emphysema.  Original Report Authenticated By: Bernadene Bell. Maricela Curet, M.D.    Medications: Scheduled Meds:   . acetaminophen  650 mg Oral Once  . albuterol  5 mg Nebulization Once  . albuterol  5 mg Nebulization Once  . aspirin EC  81 mg Oral Daily  . azithromycin  500 mg Intravenous Q24H  . azithromycin  500 mg Oral Once  . bisoprolol-hydrochlorothiazide  1 tablet Oral Daily  . calcium-vitamin D  2 tablet Oral Daily  . cefTRIAXone (ROCEPHIN)  IV  1 g Intravenous Once  .  cefTRIAXone (ROCEPHIN)  IV  1 g Intravenous Q24H  . docusate sodium  100 mg Oral BID  . enoxaparin  40 mg Subcutaneous Q24H  . guaiFENesin  1,200 mg Oral BID  . insulin aspart  0-9 Units Subcutaneous TID WC  . ipratropium  0.5 mg Nebulization Once  . methylPREDNISolone (SOLU-MEDROL) injection  125 mg Intravenous Once  . multivitamins ther. w/minerals  1 tablet Oral Daily  . nicotine  14 mg Transdermal Daily  . omega-3 acid ethyl esters  1 g Oral Daily  . pantoprazole  40 mg Oral Q1200  . predniSONE  20 mg Oral Q breakfast  . simvastatin  20 mg Oral QHS  . DISCONTD: albuterol  2.5 mg Nebulization Q6H  . DISCONTD: insulin aspart  0-9 Units Subcutaneous TID WC  . DISCONTD: ipratropium  0.5 mg Nebulization Q6H  . DISCONTD: predniSONE  10 mg Oral Q breakfast  . DISCONTD: valACYclovir  500 mg Oral Daily   Continuous Infusions:   . sodium chloride 100 mL/hr at 06/07/11 0858   PRN Meds:.acetaminophen, acetaminophen, albuterol, morphine, ondansetron (ZOFRAN) IV, ondansetron, zolpidem, DISCONTD: albuterol  Assessment/Plan: 1-Community acquired Pneumonia (presumed to be  bacterial): Patient overall feeling better, cough improving and no fever. Still with mild shortness of breath and requiring oxygen. Continue IV antibiotics for one more day the patient continued to improve and unable to maintain good O2 sat on room air Will DC home with by mouth antibiotics to complete regimen.   2-Tobacco abuse: Smoking cessation counseling was provided, patient contemplating quitting. Continue nicotine patch.  3-HTN (hypertension): Stable and well controlled. Continue current regimen.  4-Diabetes mellitus: Continue sliding scale and low carbohydrate diet.  5-Hyponatremia: Resolved with fluid resuscitation.  6-emphysematous changes on X-Ray: Most likely due to tobacco abuse. The patient at home use PRN albuterol and continue prednisone 5 days tx.  7-tingling sensation: Will check B12 level; TSH within normal limits. Will discontinue Valtrex.   LOS: 1 day   Magnus Crescenzo 06/07/2011, 11:44 AM

## 2011-06-07 NOTE — Progress Notes (Signed)
MD please address core measures for HF

## 2011-06-08 LAB — HEPATITIS PANEL, ACUTE
HCV Ab: NEGATIVE
Hep A IgM: NEGATIVE
Hep B C IgM: NEGATIVE
Hepatitis B Surface Ag: NEGATIVE

## 2011-06-08 LAB — LEGIONELLA ANTIGEN, URINE: Legionella Antigen, Urine: NEGATIVE

## 2011-06-08 LAB — GLUCOSE, CAPILLARY
Glucose-Capillary: 132 mg/dL — ABNORMAL HIGH (ref 70–99)
Glucose-Capillary: 181 mg/dL — ABNORMAL HIGH (ref 70–99)
Glucose-Capillary: 206 mg/dL — ABNORMAL HIGH (ref 70–99)
Glucose-Capillary: 135 mg/dL — ABNORMAL HIGH (ref 70–99)

## 2011-06-08 MED ORDER — METOCLOPRAMIDE HCL 5 MG/ML IJ SOLN
5.0000 mg | Freq: Once | INTRAMUSCULAR | Status: AC
  Administered 2011-06-08: 5 mg via INTRAVENOUS
  Filled 2011-06-08: qty 1

## 2011-06-08 MED ORDER — INSULIN GLARGINE 100 UNIT/ML ~~LOC~~ SOLN
4.0000 [IU] | SUBCUTANEOUS | Status: DC
  Administered 2011-06-08 – 2011-06-11 (×3): 4 [IU] via SUBCUTANEOUS
  Filled 2011-06-08: qty 3

## 2011-06-08 MED ORDER — OSELTAMIVIR PHOSPHATE 75 MG PO CAPS
75.0000 mg | ORAL_CAPSULE | Freq: Every day | ORAL | Status: DC
  Administered 2011-06-08 – 2011-06-09 (×2): 75 mg via ORAL
  Filled 2011-06-08 (×3): qty 1

## 2011-06-08 NOTE — Progress Notes (Signed)
SUBJECTIVE Feels better, but not breathing out this "stuff". Nauseated this evening.   1. COPD (chronic obstructive pulmonary disease)   2. Cough   3. Bronchitis, acute, with bronchospasm     Past Medical History  Diagnosis Date  . Allergy   . Elevated cholesterol   . Hypertension   . Genital HSV     gets on hip  . Diverticulosis   . 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 Vitamin D deficiency disease   . Osteopenia   . Migraine   . Elevated liver enzymes     fatty liver per ultrasound per pt  . Impaired glucose tolerance   . Colon polyp    Current Facility-Administered Medications  Medication Dose Route Frequency Provider Last Rate Last Dose  . acetaminophen (TYLENOL) tablet 650 mg  650 mg Oral Q6H PRN Dafney Farler   650 mg at 06/07/11 2101   Or  . acetaminophen (TYLENOL) suppository 650 mg  650 mg Rectal Q6H PRN Tatisha Cerino Lilie Vezina      . albuterol (PROVENTIL) (5 MG/ML) 0.5% nebulizer solution 2.5 mg  2.5 mg Nebulization Q6H PRN Vassie Loll, MD      . aspirin EC tablet 81 mg  81 mg Oral Daily Paislee Szatkowski   81 mg at 06/08/11 1112  . azithromycin (ZITHROMAX) 500 mg in dextrose 5 % 250 mL IVPB  500 mg Intravenous Q24H Amila Callies   500 mg at 06/08/11 1305  . bisoprolol-hydrochlorothiazide (ZIAC) 10-6.25 MG per tablet 1 tablet  1 tablet Oral Daily Iola Turri   1 tablet at 06/08/11 1100  . calcium-vitamin D (OSCAL WITH D) 500-200 MG-UNIT per tablet 2 tablet  2 tablet Oral Daily Asim Gersten   2 tablet at 06/08/11 1112  . cefTRIAXone (ROCEPHIN) 1 g in dextrose 5 % 50 mL IVPB  1 g Intravenous Q24H Lashay Osborne   1 g at 06/08/11 1201  . docusate sodium (COLACE) capsule 100 mg  100 mg Oral BID Mariadelosang Wynns   100 mg at 06/08/11 1112  . enoxaparin (LOVENOX) injection 40 mg  40 mg Subcutaneous Q24H Emme Rosenau   40 mg at 06/07/11 2101  . guaiFENesin (MUCINEX) 12 hr tablet 1,200 mg  1,200 mg Oral BID Alessandro Griep   1,200 mg at 06/08/11 1111  . insulin aspart (novoLOG) injection  0-9 Units  0-9 Units Subcutaneous TID WC Ivelisse Culverhouse   3 Units at 06/08/11 1835  . metoCLOPramide (REGLAN) injection 5 mg  5 mg Intravenous Once Appolonia Ackert      . morphine 2 MG/ML injection 2 mg  2 mg Intravenous Q4H PRN Treyon Wymore      . multivitamins ther. w/minerals tablet 1 tablet  1 tablet Oral Daily Analyah Mcconnon   1 tablet at 06/08/11 1113  . nicotine (NICODERM CQ - dosed in mg/24 hours) patch 14 mg  14 mg Transdermal Daily Mayleen Borrero      . omega-3 acid ethyl esters (LOVAZA) capsule 1 g  1 g Oral Daily Fabien Travelstead   1 g at 06/08/11 1112  . ondansetron (ZOFRAN) tablet 4 mg  4 mg Oral Q6H PRN Hector Venne       Or  . ondansetron (ZOFRAN) injection 4 mg  4 mg Intravenous Q6H PRN Haidynn Almendarez      . oseltamivir (TAMIFLU) capsule 75 mg  75 mg Oral Daily Jacinda Kanady      . pantoprazole (PROTONIX) EC tablet 40 mg  40 mg Oral Q1200 Youlanda Tomassetti  Mattox Schorr   40 mg at 06/08/11 1302  . predniSONE (DELTASONE) tablet 20 mg  20 mg Oral Q breakfast Vassie Loll, MD   20 mg at 06/08/11 0827  . simvastatin (ZOCOR) tablet 20 mg  20 mg Oral QHS Kynadee Dam   20 mg at 06/07/11 2101  . zolpidem (AMBIEN) tablet 5 mg  5 mg Oral QHS PRN Xavyer Steenson      . DISCONTD: 0.9 %  sodium chloride infusion   Intravenous Continuous Vassie Loll, MD 75 mL/hr at 06/07/11 2251     Allergies  Allergen Reactions  . Contrast Media (Iodinated Diagnostic Agents) Other (See Comments)    Could not breath  . Iohexol Other (See Comments)    Immediately could not breathe  . Levaquin Other (See Comments)    insomnia  . Sulfa Antibiotics Other (See Comments)    Historical from mother, pt states that mother says she almost died from this drug  . Codeine Nausea Only, Anxiety and Other (See Comments)    insomnia  . Lipitor (Atorvastatin Calcium) Rash   Active Problems:  Pneumonia  Tobacco abuse  HTN (hypertension)  Diabetes mellitus  Hyponatremia  Transaminitis   Vital signs in last 24 hours: Temp:  [98.2 F  (36.8 C)-99.1 F (37.3 C)] 98.2 F (36.8 C) (12/09 1330) Pulse Rate:  [93-112] 112  (12/09 1330) Resp:  [19-20] 20  (12/09 1330) BP: (103-131)/(63-78) 117/67 mmHg (12/09 1330) SpO2:  [74 %-93 %] 91 % (12/09 1825) Weight change:  Last BM Date: 06/06/11  Intake/Output from previous day: 12/08 0701 - 12/09 0700 In: 1906.7 [P.O.:600; I.V.:1006.7; IV Piggyback:300] Out: 2950 [Urine:2950] Intake/Output this shift:    Lab Results:  Basename 06/07/11 0615 06/06/11 2213  WBC 6.4 6.1  HGB 14.7 15.2*  HCT 43.9 43.4  PLT 136* 125*   BMET  Basename 06/07/11 0615 06/06/11 2213 06/06/11 1153  NA 135 -- 129*  K 4.3 -- 4.2  CL 97 -- 91*  CO2 31 -- 29  GLUCOSE 93 -- 133*  BUN 18 -- 16  CREATININE 0.61 0.63 --  CALCIUM 8.7 -- 9.1    Studies/Results: X-ray Chest Pa And Lateral   06/07/2011  *RADIOLOGY REPORT*  Clinical Data: Pneumonia.  Cough and congestion.  Short of breath.  CHEST - 2 VIEW  Comparison: 06/06/2011.  Findings: Right axillary dissection clips are present. Density is present in the right lower lobe on the lateral view, which may represent atelectasis or airspace disease.  Hyperinflation is present suggesting emphysema.  No pleural effusion is identified. Cardiopericardial silhouette appears within normal limits.  Aortic arch atherosclerosis.  Trachea midline. Compared to prior exam, pulmonary aeration is improved.  IMPRESSION: Right lower lobe airspace disease or atelectasis. Pneumonia cannot be excluded.  Emphysema.  Original Report Authenticated By: Andreas Newport, M.D.    Medications: I have reviewed the patient's current medications.   Physical exam GENERAL- alert and well HEAD- normal atraumatic, no neck masses, normal thyroid, no jvd RESPIRATORY- appears well, vitals normal, no respiratory distress, acyanotic, normal RR, ear and throat exam is normal, neck free of mass or lymphadenopathy, chest clear, no wheezing, crepitations, rhonchi, normal symmetric air  entry CVS- regular rate and rhythm, S1, S2 normal, no murmur, click, rub or gallop ABDOMEN- abdomen is soft without significant tenderness, masses, organomegaly or guarding NEURO- Grossly normal EXTREMITIES- extremities normal, atraumatic, no cyanosis or edema  Plan 1.  Pneumonia- some improvement, though slow. Will add tamiflu. keep on iv abx today.  2. Tobacco  abuse- cessation counseling given.  3.  HTN (hypertension)-controlled.  4. Diabetes mellitus- not optimally controlled. Steroid element. Add low dose lantus.   5. Hyponatremia- resolved.   6. Transaminitis- likely hepatic steatosis, also statin element.    Zaki Gertsch 06/08/2011 7:31 PM Pager: [phone].

## 2011-06-08 NOTE — Progress Notes (Signed)
Physical Therapy Evaluation Patient Details Name: Shannon Schultz MRN: [id number] DOB: 07-17-1945 Today's Date: 06/08/2011  Problem List:  Patient Active Problem List  Diagnoses  . Pure hypercholesterolemia  . Type II or unspecified type diabetes mellitus without mention of complication, not stated as uncontrolled  . Essential hypertension, benign  . Tobacco use disorder  . Vitamin D deficiency  . Pneumonia  . Tobacco abuse  . HTN (hypertension)  . Diabetes mellitus  . Hyponatremia  . Transaminitis    Past Medical History:  Past Medical History  Diagnosis Date  . Allergy   . Elevated cholesterol   . Hypertension   . Genital HSV     gets on hip  . Diverticulosis   . 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 Vitamin D deficiency disease   . Osteopenia   . Migraine   . Elevated liver enzymes     fatty liver per ultrasound per pt  . Impaired glucose tolerance   . Colon polyp    Past Surgical History:  Past Surgical History  Procedure Date  . Mastectomy 1992    R breast for cancer  . Colonoscopy 2009, 08/2010    Dr. Loreta Ave  . Cholecystectomy 2003  . Hernia repair 2003    umbilical    PT Assessment/Plan/Recommendation PT Assessment Clinical Impression Statement: Patient is a 65 yo female admitted with CAP. Patient with significant dyspnea and decrease in O2 sats (74%) with mobility on room air.  Patient also with decreased strength impacting mobility/balance.  At this point, patient will need 24 hour assist for several days at discharge.  Would recommend HHPT for continued therapy at discharge.  Anticipate mobility will improve as PNA improves. PT Recommendation/Assessment: Patient will need skilled PT in the acute care venue PT Problem List: Decreased strength;Decreased activity tolerance;Decreased balance;Decreased mobility;Decreased knowledge of use of DME;Cardiopulmonary status limiting activity PT Therapy Diagnosis : Abnormality of gait;Generalized weakness PT Plan PT  Frequency: Min 3X/week PT Treatment/Interventions: DME instruction;Gait training;Functional mobility training;Patient/family education PT Recommendation Follow Up Recommendations: Home health PT Equipment Recommended: None recommended by PT PT Goals  Acute Rehab PT Goals PT Goal Formulation: With patient Time For Goal Achievement: 7 days Pt will go Supine/Side to Sit: Independently;with HOB 0 degrees PT Goal: Supine/Side to Sit - Progress: Not met Pt will go Sit to Supine/Side: Independently;with HOB 0 degrees PT Goal: Sit to Supine/Side - Progress: Not met Pt will go Sit to Stand: with supervision PT Goal: Sit to Stand - Progress: Not met Pt will go Stand to Sit: with supervision PT Goal: Stand to Sit - Progress: Not met Pt will Ambulate: >150 feet;with supervision;with least restrictive assistive device;Other (comment) (maintaining O2 sat of at least 90%) PT Goal: Ambulate - Progress: Not met  PT Evaluation Precautions/Restrictions    Prior Functioning  Home Living Lives With: Spouse Type of Home: House Home Layout: One level Home Access: Level entry Bathroom Shower/Tub: Health visitor: Handicapped height Home Adaptive Equipment: Straight cane Prior Function Level of Independence: Independent with basic ADLs;Independent with homemaking with ambulation;Independent with gait Driving: Yes Cognition Cognition Arousal/Alertness: Awake/alert Overall Cognitive Status: Appears within functional limits for tasks assessed Sensation/Coordination Sensation Light Touch: Appears Intact Extremity Assessment RUE Assessment RUE Assessment: Within Functional Limits LUE Assessment LUE Assessment: Within Functional Limits RLE Assessment RLE Assessment: Within Functional Limits LLE Assessment LLE Assessment: Within Functional Limits Mobility (including Balance) Bed Mobility Bed Mobility: No Transfers Transfers: Yes Sit to Stand: 5: Supervision;With upper  extremity  assist;From bed;From chair/3-in-1;With armrests Sit to Stand Details (indicate cue type and reason): Cues for safe hand placement during transition. Supervision for safety/balance Stand to Sit: 5: Supervision;With upper extremity assist;To bed Stand to Sit Details: Cues for safety Ambulation/Gait Ambulation/Gait: Yes Ambulation/Gait Assistance: 5: Supervision Ambulation/Gait Assistance Details (indicate cue type and reason): Cues to move slowly to minimize dyspnea.  After ambulating 60 feet on room air, patient SOBwith decreased O2 sats (down to 74%).  Reapplied O2 at 4 l/min and O2 sat increased to 91 in < 30 seconds.  RN notified. Ambulation Distance (Feet): 60 Feet Assistive device: Rolling walker Gait Pattern: Step-through pattern;Decreased stride length;Trunk flexed Gait velocity: Slow gait speed Stairs: No  Balance Balance Assessed: No Exercise    End of Session PT - End of Session Equipment Utilized During Treatment: Gait belt Activity Tolerance: Patient limited by fatigue;Treatment limited secondary to medical complications (Comment) (Patient limited by dyspnea and decreased O2 sat.) Patient left: in bed;with call bell in reach (Sitting on edge of bed ) Nurse Communication: Mobility status for ambulation;Other (comment) (O2 sat decrease on room air) General Behavior During Session: [facility] for tasks performed Cognition: [facility] for tasks performed  Vena Austria [phone] 06/08/2011, 7:02 PM

## 2011-06-09 LAB — GLUCOSE, CAPILLARY
Glucose-Capillary: 135 mg/dL — ABNORMAL HIGH (ref 70–99)
Glucose-Capillary: 149 mg/dL — ABNORMAL HIGH (ref 70–99)
Glucose-Capillary: 191 mg/dL — ABNORMAL HIGH (ref 70–99)
Glucose-Capillary: 174 mg/dL — ABNORMAL HIGH (ref 70–99)

## 2011-06-09 MED ORDER — AMOXICILLIN-POT CLAVULANATE 875-125 MG PO TABS
1.0000 | ORAL_TABLET | Freq: Two times a day (BID) | ORAL | Status: DC
  Administered 2011-06-09 – 2011-06-11 (×4): 1 via ORAL
  Filled 2011-06-09 (×5): qty 1

## 2011-06-09 NOTE — Progress Notes (Signed)
SUBJECTIVE felt bad this morning. Feels better.   1. COPD (chronic obstructive pulmonary disease)   2. Cough   3. Bronchitis, acute, with bronchospasm     Past Medical History  Diagnosis Date  . Allergy   . Elevated cholesterol   . Hypertension   . Genital HSV     gets on hip  . Diverticulosis   . 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 Vitamin D deficiency disease   . Osteopenia   . Migraine   . Elevated liver enzymes     fatty liver per ultrasound per pt  . Impaired glucose tolerance   . Colon polyp    Current Facility-Administered Medications  Medication Dose Route Frequency Provider Last Rate Last Dose  . acetaminophen (TYLENOL) tablet 650 mg  650 mg Oral Q6H PRN Kaiea Esselman   650 mg at 06/09/11 0416   Or  . acetaminophen (TYLENOL) suppository 650 mg  650 mg Rectal Q6H PRN Laiyla Slagel      . albuterol (PROVENTIL) (5 MG/ML) 0.5% nebulizer solution 2.5 mg  2.5 mg Nebulization Q6H PRN Vassie Loll, MD      . aspirin EC tablet 81 mg  81 mg Oral Daily Geet Hosking   81 mg at 06/09/11 1140  . azithromycin (ZITHROMAX) 500 mg in dextrose 5 % 250 mL IVPB  500 mg Intravenous Q24H Bryon Parker   500 mg at 06/08/11 1305  . bisoprolol-hydrochlorothiazide (ZIAC) 10-6.25 MG per tablet 1 tablet  1 tablet Oral Daily Arihaan Bellucci   1 tablet at 06/09/11 1134  . calcium-vitamin D (OSCAL WITH D) 500-200 MG-UNIT per tablet 2 tablet  2 tablet Oral Daily Aislinn Feliz   2 tablet at 06/09/11 1134  . cefTRIAXone (ROCEPHIN) 1 g in dextrose 5 % 50 mL IVPB  1 g Intravenous Q24H Katelyn Kohlmeyer   1 g at 06/09/11 1211  . docusate sodium (COLACE) capsule 100 mg  100 mg Oral BID Sonny Poth   100 mg at 06/09/11 1133  . enoxaparin (LOVENOX) injection 40 mg  40 mg Subcutaneous Q24H Ibrahima Holberg   40 mg at 06/08/11 2115  . guaiFENesin (MUCINEX) 12 hr tablet 1,200 mg  1,200 mg Oral BID Srihaan Mastrangelo   1,200 mg at 06/09/11 1133  . insulin aspart (novoLOG) injection 0-9 Units  0-9 Units Subcutaneous TID WC  Natalyn Szymanowski   1 Units at 06/09/11 1220  . insulin glargine (LANTUS) injection 4 Units  4 Units Subcutaneous Q0700 Doris Mcgilvery   4 Units at 06/08/11 2116  . metoCLOPramide (REGLAN) injection 5 mg  5 mg Intravenous Once Manvir Prabhu   5 mg at 06/08/11 2119  . morphine 2 MG/ML injection 2 mg  2 mg Intravenous Q4H PRN Careem Yasui      . multivitamins ther. w/minerals tablet 1 tablet  1 tablet Oral Daily Emrick Hensch   1 tablet at 06/09/11 1133  . nicotine (NICODERM CQ - dosed in mg/24 hours) patch 14 mg  14 mg Transdermal Daily Ott Zimmerle      . omega-3 acid ethyl esters (LOVAZA) capsule 1 g  1 g Oral Daily Yaret Hush   1 g at 06/09/11 1131  . ondansetron (ZOFRAN) tablet 4 mg  4 mg Oral Q6H PRN Tarry Fountain       Or  . ondansetron (ZOFRAN) injection 4 mg  4 mg Intravenous Q6H PRN Taquisha Phung   4 mg at 06/09/11 0416  . oseltamivir (TAMIFLU) capsule 75 mg  75  mg Oral QHS Vonte Rossin   75 mg at 06/08/11 2114  . pantoprazole (PROTONIX) EC tablet 40 mg  40 mg Oral Q1200 Shaquan Puerta   40 mg at 06/09/11 1223  . predniSONE (DELTASONE) tablet 20 mg  20 mg Oral Q breakfast Vassie Loll, MD   20 mg at 06/09/11 0850  . simvastatin (ZOCOR) tablet 20 mg  20 mg Oral QHS Brysin Towery   20 mg at 06/08/11 2115  . zolpidem (AMBIEN) tablet 5 mg  5 mg Oral QHS PRN Shanikka Wonders      . DISCONTD: 0.9 %  sodium chloride infusion   Intravenous Continuous Vassie Loll, MD 75 mL/hr at 06/07/11 2251     Allergies  Allergen Reactions  . Contrast Media (Iodinated Diagnostic Agents) Other (See Comments)    Could not breath  . Iohexol Other (See Comments)    Immediately could not breathe  . Levaquin Other (See Comments)    insomnia  . Sulfa Antibiotics Other (See Comments)    Historical from mother, pt states that mother says she almost died from this drug  . Codeine Nausea Only, Anxiety and Other (See Comments)    insomnia  . Lipitor (Atorvastatin Calcium) Rash   Active Problems:  Pneumonia   Tobacco abuse  HTN (hypertension)  Diabetes mellitus  Hyponatremia  Transaminitis   Vital signs in last 24 hours: Temp:  [98.2 F (36.8 C)-98.4 F (36.9 C)] 98.4 F (36.9 C) (12/10 1335) Pulse Rate:  [78-109] 78  (12/10 1335) Resp:  [19-20] 20  (12/10 1335) BP: (99-109)/(68-69) 99/68 mmHg (12/10 1335) SpO2:  [74 %-95 %] 95 % (12/10 1335) Weight change:  Last BM Date: 06/06/11  Intake/Output from previous day: 12/09 0701 - 12/10 0700 In: 2887.8 [P.O.:759; I.V.:1828.8; IV Piggyback:300] Out: 2750 [Urine:2750] Intake/Output this shift: Total I/O In: 480 [P.O.:480] Out: 750 [Urine:750]  Lab Results:  [facility] 06/07/11 0615 06/06/11 2213  WBC 6.4 6.1  HGB 14.7 15.2*  HCT 43.9 43.4  PLT 136* 125*   BMET  Basename 06/07/11 0615 06/06/11 2213  NA 135 --  K 4.3 --  CL 97 --  CO2 31 --  GLUCOSE 93 --  BUN 18 --  CREATININE 0.61 0.63  CALCIUM 8.7 --    Studies/Results: No results found.  Medications: I have reviewed the patient's current medications.   Physical exam GENERAL- alert and well HEAD- normal atraumatic, no neck masses, normal thyroid, no jvd RESPIRATORY- appears well, vitals normal, no respiratory distress, acyanotic, normal RR, ear and throat exam is normal, neck free of mass or lymphadenopathy, chest clear, no wheezing, crepitations, rhonchi, normal symmetric air entry CVS- regular rate and rhythm, S1, S2 normal, no murmur, click, rub or gallop ABDOMEN- abdomen is soft without significant tenderness, masses, organomegaly or guarding NEURO- Grossly normal EXTREMITIES- extremities normal, atraumatic, no cyanosis or edema  Plan  1. Pneumonia- better, switch to po abx, may need home O2 for a while. 2. Tobacco abuse- cessation counseling given.  3. HTN (hypertension)-controlled.  4. Diabetes mellitus- better controlled..  5. Hyponatremia- resolved.  6. Transaminitis- due to hepatic steatosis, also statin element. Disposition-d/c in  am?      Meril Dray 06/09/2011 5:41 PM Pager: [phone].

## 2011-06-10 LAB — GLUCOSE, CAPILLARY
Glucose-Capillary: 116 mg/dL — ABNORMAL HIGH (ref 70–99)
Glucose-Capillary: 143 mg/dL — ABNORMAL HIGH (ref 70–99)
Glucose-Capillary: 177 mg/dL — ABNORMAL HIGH (ref 70–99)
Glucose-Capillary: 169 mg/dL — ABNORMAL HIGH (ref 70–99)
Glucose-Capillary: 173 mg/dL — ABNORMAL HIGH (ref 70–99)

## 2011-06-10 MED ORDER — POTASSIUM CHLORIDE 20 MEQ/15ML (10%) PO LIQD
40.0000 meq | Freq: Once | ORAL | Status: AC
  Administered 2011-06-10: 40 meq via ORAL
  Filled 2011-06-10: qty 30

## 2011-06-10 MED ORDER — PREDNISONE 20 MG PO TABS: 20.0000 mg | ORAL_TABLET | Freq: Every day | ORAL | Status: DC

## 2011-06-10 MED ORDER — ONDANSETRON HCL 4 MG PO TABS: 4.0000 mg | ORAL_TABLET | Freq: Four times a day (QID) | ORAL | Status: AC | PRN

## 2011-06-10 MED ORDER — GUAIFENESIN ER 600 MG PO TB12: 1200.0000 mg | ORAL_TABLET | Freq: Two times a day (BID) | ORAL | Status: DC

## 2011-06-10 MED ORDER — AMOXICILLIN-POT CLAVULANATE 875-125 MG PO TABS: 1.0000 | ORAL_TABLET | Freq: Two times a day (BID) | ORAL | Status: AC

## 2011-06-10 MED ORDER — PANTOPRAZOLE SODIUM 40 MG PO TBEC: 40.0000 mg | DELAYED_RELEASE_TABLET | Freq: Every day | ORAL | Status: DC

## 2011-06-10 MED ORDER — FUROSEMIDE 10 MG/ML IJ SOLN
40.0000 mg | Freq: Once | INTRAMUSCULAR | Status: AC
  Administered 2011-06-10: 40 mg via INTRAVENOUS
  Filled 2011-06-10: qty 4

## 2011-06-10 NOTE — Progress Notes (Signed)
PT ARRANGED FOR HH PT AND POSSIBLE HH O2 WITH [facility].  PT MAY DC TODAY OR TOMORROW. Willa Rough 06/10/2011 [phone] OR [phone]

## 2011-06-10 NOTE — Progress Notes (Signed)
Physical Therapy Treatment Patient Details Name: PRECIOUS SEGALL MRN: [id number] DOB: July 12, 1945 Today's Date: 06/10/2011  PT Assessment/Plan  PT - Assessment/Plan Comments on Treatment Session: With ~20' ambulation pt's SpO2 dropped to 88% on 3L [place] O2, pt had seated rest break and was unable to increase SpO2 > 90% until placed on 4L [place] O2. Pt ambulated 20 more feet on 4L [place] O2 with SpO2>/= 90%. Pt with some dizziness today in standing requiring a hand hold assist with ambulation. Discussed at length energy conservation methods at home and placing chairs in strategic positions to keep from desaturating. Pt will be slowly progressed by HHPT.  PT Plan: Discharge plan remains appropriate PT Frequency: Min 3X/week Follow Up Recommendations: Home health PT Equipment Recommended: None recommended by PT PT Goals  Acute Rehab PT Goals Time For Goal Achievement: 7 days Pt will go Supine/Side to Sit: Independently;with HOB 0 degrees PT Goal: Supine/Side to Sit - Progress:  (not assessed) Pt will go Sit to Supine/Side: Independently;with HOB 0 degrees PT Goal: Sit to Supine/Side - Progress:  (not assessed) Pt will go Sit to Stand: with supervision PT Goal: Sit to Stand - Progress: Met (progress to modified independent) Pt will go Stand to Sit: with supervision PT Goal: Stand to Sit - Progress: Met (progress to mod. I. ) Pt will Ambulate: >150 feet;with supervision;with least restrictive assistive device;Other (comment) (maintaining O2 sat of at least 90%) PT Goal: Ambulate - Progress: Progressing toward goal  PT Treatment Precautions/Restrictions  Precautions Precautions: Fall Precaution Comments: Deconditioning Restrictions Weight Bearing Restrictions: No Mobility (including Balance) Bed Mobility Bed Mobility: No (Seated at start) Transfers Sit to Stand: 5: Supervision;From chair/3-in-1;With armrests Sit to Stand Details (indicate cue type and reason): Verbal cues to not use rolling table  to push up with. Performed 2x for activity tolerance Stand to Sit: 5: Supervision Stand to Sit Details: Verbal cues for safety Ambulation/Gait Ambulation/Gait: Yes Ambulation/Gait Assistance: 5: Supervision;4: Min assist Ambulation/Gait Assistance Details (indicate cue type and reason): Supervision progressing to min assist with fatigue. Pt requiring seated rest break after SpO2 dropped to 88% on 3 L [place] O2 Ambulation Distance (Feet): 40 Feet (20' then 20') Assistive device: 1 person hand held assist Gait Pattern: Shuffle;Trunk flexed;Decreased step length - right;Decreased step length - left;Decreased stride length (Decr. foot clearance Bil. )  Posture/Postural Control Posture/Postural Control: No significant limitations Balance Balance Assessed: Yes (Pt with decreased lateral stability with gait) Exercise  Total Joint Exercises Long Arc Quad: AROM;Strengthening;Both;15 reps;Seated (manually resisted) Knee Flexion: AROM;Strengthening;Both;15 reps;Seated (manually resisted) End of Session PT - End of Session Equipment Utilized During Treatment: Gait belt Activity Tolerance: Patient limited by fatigue;Treatment limited secondary to medical complications (Comment) (Patient limited decreased O2 sat.) Patient left: with call bell in reach;in chair (Sitting on edge of bed ) Nurse Communication: Mobility status for ambulation;Mobility status for transfers General Behavior During Session: [facility] for tasks performed Cognition: [facility] for tasks performed  Sherie Don 06/10/2011, 3:14 PM  Sherie Don) Carleene Mains PT, DPT Acute Rehabilitation [phone]

## 2011-06-10 NOTE — Progress Notes (Signed)
Patient sats on RA is 79% sitting in chair Patient placed back on 4 litter and O2 sats increase up 93%

## 2011-06-11 ENCOUNTER — Inpatient Hospital Stay (HOSPITAL_COMMUNITY): Payer: BC Managed Care – PPO

## 2011-06-11 LAB — COMPREHENSIVE METABOLIC PANEL WITH GFR
ALT: 94 U/L — ABNORMAL HIGH (ref 0–35)
AST: 58 U/L — ABNORMAL HIGH (ref 0–37)
Albumin: 3.8 g/dL (ref 3.5–5.2)
Alkaline Phosphatase: 46 U/L (ref 39–117)
BUN: 16 mg/dL (ref 6–23)
CO2: 38 meq/L — ABNORMAL HIGH (ref 19–32)
Calcium: 10.3 mg/dL (ref 8.4–10.5)
Chloride: 94 meq/L — ABNORMAL LOW (ref 96–112)
Creatinine, Ser: 0.6 mg/dL (ref 0.50–1.10)
GFR calc Af Amer: >90 mL/min
GFR calc non Af Amer: >90 mL/min
Glucose, Bld: 122 mg/dL — ABNORMAL HIGH (ref 70–99)
Potassium: 4.6 meq/L (ref 3.5–5.1)
Sodium: 143 meq/L (ref 135–145)
Total Bilirubin: 0.5 mg/dL (ref 0.3–1.2)
Total Protein: 7.1 g/dL (ref 6.0–8.3)

## 2011-06-11 LAB — CBC
HCT: 45.2 % (ref 36.0–46.0)
Hemoglobin: 15.1 g/dL — ABNORMAL HIGH (ref 12.0–15.0)
MCH: 30.8 pg (ref 26.0–34.0)
MCHC: 33.4 g/dL (ref 30.0–36.0)
MCV: 92.2 fL (ref 78.0–100.0)
Platelets: 147 K/uL — ABNORMAL LOW (ref 150–400)
RBC: 4.9 MIL/uL (ref 3.87–5.11)
RDW: 13.4 % (ref 11.5–15.5)
WBC: 6.2 K/uL (ref 4.0–10.5)

## 2011-06-11 LAB — GLUCOSE, CAPILLARY
Glucose-Capillary: 131 mg/dL — ABNORMAL HIGH (ref 70–99)
Glucose-Capillary: 147 mg/dL — ABNORMAL HIGH (ref 70–99)

## 2011-06-11 LAB — PRO B NATRIURETIC PEPTIDE: Pro B Natriuretic peptide (BNP): 138.6 pg/mL — ABNORMAL HIGH (ref 0–125)

## 2011-06-11 NOTE — Progress Notes (Signed)
Shannon Schultz is a 65 y.o. female patient admitted on 06/06/11 with complaints of cough/sob/fever. Patient found to have PNA. She had responded slowly to treatment. She was noted to be still requiring 4L O2 this evening. Her discharge was hence held.  SUBJECTIVE "Alright I guess".   1. COPD (chronic obstructive pulmonary disease)   2. Cough   3. Bronchitis, acute, with bronchospasm   4. Essential hypertension, benign     Past Medical History  Diagnosis Date  . Allergy   . Elevated cholesterol   . Hypertension   . Genital HSV     gets on hip  . Diverticulosis   . 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 Vitamin D deficiency disease   . Osteopenia   . Migraine   . Elevated liver enzymes     fatty liver per ultrasound per pt  . Impaired glucose tolerance   . Colon polyp    Current Facility-Administered Medications  Medication Dose Route Frequency Provider Last Rate Last Dose  . acetaminophen (TYLENOL) tablet 650 mg  650 mg Oral Q6H PRN Keimari [place]   650 mg at 06/10/11 1033   Or  . acetaminophen (TYLENOL) suppository 650 mg  650 mg Rectal Q6H PRN Niko Jakel       . albuterol (PROVENTIL) (5 MG/ML) 0.5% nebulizer solution 2.5 mg  2.5 mg Nebulization Q6H PRN Vassie Loll, MD      . amoxicillin-clavulanate (AUGMENTIN) 875-125 MG per tablet 1 tablet  1 tablet Oral Q12H Bradleigh Sonnen   1 tablet at 06/10/11 2122  . aspirin EC tablet 81 mg  81 mg Oral Daily Kaiven Vester   81 mg at 06/10/11 1038  . bisoprolol-hydrochlorothiazide (ZIAC) 10-6.25 MG per tablet 1 tablet  1 tablet Oral Daily Delonna Ney   1 tablet at 06/10/11 1038  . calcium-vitamin D (OSCAL WITH D) 500-200 MG-UNIT per tablet 2 tablet  2 tablet Oral Daily Timiyah Romito   2 tablet at 06/10/11 1038  . docusate sodium (COLACE) capsule 100 mg  100 mg Oral BID Takeya Marquis   100 mg at 06/10/11 2120  . enoxaparin (LOVENOX) injection 40 mg  40 mg Subcutaneous Q24H Teauna Dubach   40 mg at 06/10/11 2120  . furosemide (LASIX)  injection 40 mg  40 mg Intravenous Once Jaquayla Hege   40 mg at 06/10/11 2122  . guaiFENesin (MUCINEX) 12 hr tablet 1,200 mg  1,200 mg Oral BID Deakin Lacek   1,200 mg at 06/10/11 2122  . insulin aspart (novoLOG) injection 0-9 Units  0-9 Units Subcutaneous TID WC Nieshia Larmon   2 Units at 06/10/11 1802  . insulin glargine (LANTUS) injection 4 Units  4 Units Subcutaneous Q0700 Larya Charpentier   4 Units at 06/10/11 4540  . morphine 2 MG/ML injection 2 mg  2 mg Intravenous Q4H PRN Macguire Holsinger      . multivitamins ther. w/minerals tablet 1 tablet  1 tablet Oral Daily Pride Gonzales   1 tablet at 06/10/11 1038  . nicotine (NICODERM CQ - dosed in mg/24 hours) patch 14 mg  14 mg Transdermal Daily Marites Nath      . omega-3 acid ethyl esters (LOVAZA) capsule 1 g  1 g Oral Daily Zadia Uhde   1 g at 06/10/11 1037  . ondansetron (ZOFRAN) tablet 4 mg  4 mg Oral Q6H PRN Salley Boxley       Or  . ondansetron (ZOFRAN) injection 4 mg  4 mg Intravenous Q6H PRN   Keelia Graybill   4 mg at 06/10/11 0829  . pantoprazole (PROTONIX) EC tablet 40 mg  40 mg Oral Q1200 Anitta Tenny   40 mg at 06/10/11 1259  . potassium chloride 20 MEQ/15ML (10%) liquid 40 mEq  40 mEq Oral Once Seiya Silsby   40 mEq at 06/10/11 2122  . predniSONE (DELTASONE) tablet 20 mg  20 mg Oral Q breakfast Rosmery Duggin   20 mg at 06/10/11 1036  . simvastatin (ZOCOR) tablet 20 mg  20 mg Oral QHS Generoso Cropper   20 mg at 06/10/11 2120  . zolpidem (AMBIEN) tablet 5 mg  5 mg Oral QHS PRN Lanayah Gartley      . DISCONTD: oseltamivir (TAMIFLU) capsule 75 mg  75 mg Oral QHS Malasia Torain   75 mg at 06/09/11 2333   Allergies  Allergen Reactions  . Contrast Media (Iodinated Diagnostic Agents) Other (See Comments)    Could not breath  . Iohexol Other (See Comments)    Immediately could not breathe  . Levaquin Other (See Comments)    insomnia  . Sulfa Antibiotics Other (See Comments)    Historical from mother, pt states that mother says she almost  died from this drug  . Codeine Nausea Only, Anxiety and Other (See Comments)    insomnia  . Lipitor (Atorvastatin Calcium) Rash   Active Problems:  Tobacco abuse  HTN (hypertension)  Diabetes mellitus  Transaminitis   Vital signs in last 24 hours: Temp:  [97.4 F (36.3 C)-98.3 F (36.8 C)] 98.3 F (36.8 C) (12/11 2300) Pulse Rate:  [69-79] 71  (12/11 2300) Resp:  [20] 20  (12/11 2300) BP: (112-121)/(70-75) 121/75 mmHg (12/11 2300) SpO2:  [79 %-98 %] 98 % (12/11 2300) Weight change:  Last BM Date: 06/08/11  Intake/Output from previous day: 12/11 0701 - 12/12 0700 In: 720 [P.O.:720] Out: -  Intake/Output this shift:    Lab Results: No results found for this basename: WBC:2,HGB:2,HCT:2,PLT:2 in the last 72 hours BMET No results found for this basename: NA:2,K:2,CL:2,CO2:2,GLUCOSE:2,BUN:2,CREATININE:2,CALCIUM:2 in the last 72 hours  Studies/Results: No results found.  Medications: I have reviewed the patient's current medications.   Physical exam GENERAL- alert HEAD- normal atraumatic, no neck masses, normal thyroid, no jvd RESPIRATORY- appears well, vitals normal, no respiratory distress, acyanotic, normal RR, ear and throat exam is normal, neck free of mass or lymphadenopathy, chest clear, no wheezing, crepitations, rhonchi, normal symmetric air entry CVS- regular rate and rhythm, S1, S2 normal, no murmur, click, rub or gallop ABDOMEN- abdomen is soft without significant tenderness, masses, organomegaly or guarding NEURO- Grossly normal EXTREMITIES- extremities normal, atraumatic, no cyanosis or edema  Plan  1. Pneumonia- hold d/c. may need home O2 for a while. trial of lasix. CXR in am.  2. Tobacco abuse- cessation counseling given.  3. HTN (hypertension)-controlled.  4. Diabetes mellitus- better controlled..    Disposition-d/c in am if improved?     Amore Ackman 06/11/2011 12:07 AM Pager: [phone].

## 2011-06-11 NOTE — Discharge Summary (Signed)
DISCHARGE SUMMARY  Shannon Schultz  MR#: [id number]  DOB:[birth year]  Date of Admission: 06/06/2011 Date of Discharge: 06/11/2011  Attending Physician:Andrewjames Weirauch  Patient's WUJ:WJXBJYN,WGNF Leonette Most, MD, MD  Consults:   Discharge Diagnoses: #1? pneumonia #2 COPD #3 chronic respirations failure. Pulse ox in room and 79%. The patient is to be on home O2 3 liters per minute every 24 hours. #4 history of chronic tobacco use. #5 hyponatremia-corrected on discharge #6 hypertension #7 diabetes mellitus. #8 abnormal LFT questionable secondary to statin use. #9 hyperlipidemia Present on Admission:  .Pneumonia .Tobacco abuse .HTN (hypertension) .Diabetes mellitus .Hyponatremia .Transaminitis    Current Discharge Medication List    START taking these medications   Details  amoxicillin-clavulanate (AUGMENTIN) 875-125 MG per tablet Take 1 tablet by mouth every 12 (twelve) hours. Qty: 10 tablet, Refills: 0    ondansetron (ZOFRAN) 4 MG tablet Take 1 tablet (4 mg total) by mouth every 6 (six) hours as needed for nausea. Qty: 20 tablet, Refills: 0    pantoprazole (PROTONIX) 40 MG tablet Take 1 tablet (40 mg total) by mouth daily at 12 noon. Qty: 15 tablet, Refills: 0    predniSONE (DELTASONE) 20 MG tablet Take 1 tablet (20 mg total) by mouth daily with breakfast. Qty: 4 tablet, Refills: 0      CONTINUE these medications which have NOT CHANGED   Details  albuterol (PROVENTIL HFA;VENTOLIN HFA) 108 (90 BASE) MCG/ACT inhaler Inhale 2 puffs into the lungs every 6 (six) hours as needed for wheezing. Qty: 18 g, Refills: 0    aspirin EC 81 MG tablet Take 81 mg by mouth daily.      bisoprolol-hydrochlorothiazide (ZIAC) 10-6.25 MG per tablet Take 1 tablet by mouth daily.      Calcium Carbonate-Vitamin D (CALCIUM 600+D) 600-400 MG-UNIT per tablet Take 2 tablets by mouth daily.      cetirizine (ZYRTEC) 10 MG tablet Take 10 mg by mouth daily.      Multiple Vitamins-Minerals  (CENTRUM SILVER PO) Take 1 tablet by mouth.      Omega-3 Fatty Acids (FISH OIL) 1200 MG CAPS Take 2 capsules by mouth daily.     Probiotic Product (HEALTHY COLON PO) Take 1 tablet by mouth daily.      simvastatin (ZOCOR) 20 MG tablet Take 20 mg by mouth at bedtime.      valACYclovir (VALTREX) 1000 MG tablet Take 500 mg by mouth daily.     VITAMIN D, CHOLECALCIFEROL, PO Take 2 capsules by mouth daily.            Hospital Course: Patient is a 65 year old Caucasian female with history of diabetes mellitus, chronic tobacco use was admitted to the hospital on 06/06/2011 with complains of cough shortness of breath and fever of 3 days' duration. Examination showed patient to be ill looking and she was hypoxic with pulse ox less than 80%. Chest x ray done on patient showed questionable infiltrates. Patient was also found to have hyponatremia. Patient was admitted to general medical floor. She was placed on O2 via nasal canulla. She was given albuterol and Atrovent nebs. And also started on Augmentin. Because of hyponatremia, Legionella antigen was ordered. Also given to patient was steroids. Because patient was obscured she was placed on Accu-Cheks with insulin sliding scale. She was also given Lantus insulin. Patient blood pressure was maintained with bisoprolol/hydrochlorothiazide. She will she was also given Mucinex as anti-tussive. Also given to patient was nicotine transdermal patch since she  expressed her willingness to quit smoking. Patient was  also given lovaza for hypercholesterolemia. Patient was followed and evaluated on daily basis and made remarkable progress. She was ambulated in room air without oxygen and pulse ox was about 79%. So for patient remained clinically stable. She was seen by me for the first time in this index admission today which is 06/11/2011. She denied any specific complaint. Vital signs are stable. Examination of the lungs showed decreased breath sounds bibasilar but no  adventitial  sounds. Plan is for patient to be discharge home today. Present on Admission:  .Pneumonia .Tobacco abuse .HTN (hypertension) .Diabetes mellitus .Hyponatremia .Transaminitis:   Day of Discharge BP 124/74  Pulse 79  Temp(Src) 98 F (36.7 C) (Oral)  Resp 20  Ht 5\' 4"  (1.626 m)  Wt 102.8 kg (226 lb 10.1 oz)  BMI 38.90 kg/m2  SpO2 96%  Physical Exam:vital signs are stable heent-pallor Neck-no jvd Chest-decrease breath sounds bibasally.No adventitial sounds. cvs-s1 and s2 abd-soft,non tender,organs not palpable and bowel sounds are present. Ext-no pedal edema Neuro-non focal  Results for orders placed during the hospital encounter of 06/06/11 (from the past 24 hour(s))  GLUCOSE, CAPILLARY     Status: Abnormal   Collection Time   06/10/11  5:48 PM      Component Value Range   Glucose-Capillary 169 (*) 70 - 99 (mg/dL)   Comment 1 Notify RN    GLUCOSE, CAPILLARY     Status: Abnormal   Collection Time   06/10/11 10:34 PM      Component Value Range   Glucose-Capillary 173 (*) 70 - 99 (mg/dL)   Comment 1 Notify RN    CBC     Status: Abnormal   Collection Time   06/11/11  5:22 AM      Component Value Range   WBC 6.2  4.0 - 10.5 (K/uL)   RBC 4.90  3.87 - 5.11 (MIL/uL)   Hemoglobin 15.1 (*) 12.0 - 15.0 (g/dL)   HCT 16.1  09.6 - 04.5 (%)   MCV 92.2  78.0 - 100.0 (fL)   MCH 30.8  26.0 - 34.0 (pg)   MCHC 33.4  30.0 - 36.0 (g/dL)   RDW 40.9  81.1 - 91.4 (%)   Platelets 147 (*) 150 - 400 (K/uL)  COMPREHENSIVE METABOLIC PANEL     Status: Abnormal   Collection Time   06/11/11  5:22 AM      Component Value Range   Sodium 143  135 - 145 (mEq/L)   Potassium 4.6  3.5 - 5.1 (mEq/L)   Chloride 94 (*) 96 - 112 (mEq/L)   CO2 38 (*) 19 - 32 (mEq/L)   Glucose, Bld 122 (*) 70 - 99 (mg/dL)   BUN 16  6 - 23 (mg/dL)   Creatinine, Ser 7.82  0.50 - 1.10 (mg/dL)   Calcium 95.6  8.4 - 10.5 (mg/dL)   Total Protein 7.1  6.0 - 8.3 (g/dL)   Albumin 3.8  3.5 - 5.2 (g/dL)   AST  58 (*) 0 - 37 (U/L)   ALT 94 (*) 0 - 35 (U/L)   Alkaline Phosphatase 46  39 - 117 (U/L)   Total Bilirubin 0.5  0.3 - 1.2 (mg/dL)   GFR calc non Af Amer >90  >90 (mL/min)   GFR calc Af Amer >90  >90 (mL/min)  PRO B NATRIURETIC PEPTIDE     Status: Abnormal   Collection Time   06/11/11  5:22 AM      Component Value Range   Pro B Natriuretic peptide (BNP)  138.6 (*) 0 - 125 (pg/mL)  GLUCOSE, CAPILLARY     Status: Abnormal   Collection Time   06/11/11  7:50 AM      Component Value Range   Glucose-Capillary 131 (*) 70 - 99 (mg/dL)   Comment 1 Notify RN    GLUCOSE, CAPILLARY     Status: Abnormal   Collection Time   06/11/11 11:39 AM      Component Value Range   Glucose-Capillary 147 (*) 70 - 99 (mg/dL)   Comment 1 Notify RN      Disposition: stable   Follow-up Appts: Discharge Orders    Future Appointments: Provider: Department: Dept Phone: Center:   11/25/2011 8:30 AM [facility] [phone] [facility]   11/27/2011 11:45 AM Lavonda Jumbo, MD [facility] [phone] [facility]     Future Orders Please Complete By Expires   For home use only DME oxygen      Diet - low sodium heart healthy      Diet - low sodium heart healthy      Increase activity slowly      Discharge instructions      Comments:   Follow with your doctor in week.   Increase activity slowly      Discharge instructions      Comments:   Follow up pcp in 1-2weeks       Signed: Najee Cowens 06/11/2011, 1:52 PM

## 2011-06-11 NOTE — Progress Notes (Signed)
Shannon Schultz to be D/C'd Home per MD order.  Discussed with the patient and all questions fully answered.   Lollie, Gunner  Home Medication Instructions [id number]   Printed on:06/11/11 1359  Medication Information                    cetirizine (ZYRTEC) 10 MG tablet Take 10 mg by mouth daily.             Omega-3 Fatty Acids (FISH OIL) 1200 MG CAPS Take 2 capsules by mouth daily.            Multiple Vitamins-Minerals (CENTRUM SILVER PO) Take 1 tablet by mouth.             Probiotic Product (HEALTHY COLON PO) Take 1 tablet by mouth daily.             Calcium Carbonate-Vitamin D (CALCIUM 600+D) 600-400 MG-UNIT per tablet Take 2 tablets by mouth daily.             valACYclovir (VALTREX) 1000 MG tablet Take 500 mg by mouth daily.            albuterol (PROVENTIL HFA;VENTOLIN HFA) 108 (90 BASE) MCG/ACT inhaler Inhale 2 puffs into the lungs every 6 (six) hours as needed for wheezing.           bisoprolol-hydrochlorothiazide (ZIAC) 10-6.25 MG per tablet Take 1 tablet by mouth daily.             aspirin EC 81 MG tablet Take 81 mg by mouth daily.             simvastatin (ZOCOR) 20 MG tablet Take 20 mg by mouth at bedtime.             VITAMIN D, CHOLECALCIFEROL, PO Take 2 capsules by mouth daily.             amoxicillin-clavulanate (AUGMENTIN) 875-125 MG per tablet Take 1 tablet by mouth every 12 (twelve) hours.           ondansetron (ZOFRAN) 4 MG tablet Take 1 tablet (4 mg total) by mouth every 6 (six) hours as needed for nausea.           pantoprazole (PROTONIX) 40 MG tablet Take 1 tablet (40 mg total) by mouth daily at 12 noon.           predniSONE (DELTASONE) 20 MG tablet Take 1 tablet (20 mg total) by mouth daily with breakfast.             VVS, Skin clean, dry and intact without evidence of skin break down, no evidence of skin tears noted. IV catheter discontinued intact. Site without signs and symptoms of complications. Dressing and pressure applied.  Blood  pressure 124/74, pulse 79, temperature 98 F (36.7 C), temperature source Oral, resp. rate 20, height 5\' 4"  (1.626 m), weight 102.8 kg (226 lb 10.1 oz), SpO2 96.00%.   An After Visit Summary was printed and given to the patient. Patient escorted via WC, and D/C home via private auto.  Driggers, Rae Roam 06/11/2011 1:59 PM

## 2011-06-12 ENCOUNTER — Other Ambulatory Visit: Payer: Self-pay

## 2011-06-12 ENCOUNTER — Emergency Department (HOSPITAL_COMMUNITY): Payer: BC Managed Care – PPO

## 2011-06-12 ENCOUNTER — Encounter (HOSPITAL_COMMUNITY): Payer: Self-pay | Admitting: Emergency Medicine

## 2011-06-12 ENCOUNTER — Inpatient Hospital Stay (HOSPITAL_COMMUNITY)
Admission: EM | Admit: 2011-06-12 | Discharge: 2011-06-15 | DRG: 541 | Disposition: A | Discharge status: Home / Self Care | Payer: BC Managed Care – PPO | Attending: Internal Medicine | Admitting: Internal Medicine

## 2011-06-12 DIAGNOSIS — J449 Chronic obstructive pulmonary disease, unspecified: Secondary | ICD-10-CM

## 2011-06-12 DIAGNOSIS — R05 Cough: Secondary | ICD-10-CM

## 2011-06-12 DIAGNOSIS — I369 Nonrheumatic tricuspid valve disorder, unspecified: Secondary | ICD-10-CM

## 2011-06-12 DIAGNOSIS — R0689 Other abnormalities of breathing: Secondary | ICD-10-CM

## 2011-06-12 DIAGNOSIS — J209 Acute bronchitis, unspecified: Secondary | ICD-10-CM

## 2011-06-12 DIAGNOSIS — Z720 Tobacco use: Secondary | ICD-10-CM | POA: Diagnosis present

## 2011-06-12 DIAGNOSIS — R0902 Hypoxemia: Secondary | ICD-10-CM | POA: Diagnosis present

## 2011-06-12 DIAGNOSIS — R059 Cough, unspecified: Secondary | ICD-10-CM

## 2011-06-12 LAB — CBC
HCT: 44.9 % (ref 36.0–46.0)
Hemoglobin: 15.1 g/dL — ABNORMAL HIGH (ref 12.0–15.0)
MCH: 30.3 pg (ref 26.0–34.0)
MCHC: 33.6 g/dL (ref 30.0–36.0)
MCV: 90.2 fL (ref 78.0–100.0)
Platelets: 165 10*3/uL (ref 150–400)
RBC: 4.98 MIL/uL (ref 3.87–5.11)
RDW: 13.3 % (ref 11.5–15.5)
WBC: 5.7 10*3/uL (ref 4.0–10.5)

## 2011-06-12 LAB — BLOOD GAS, ARTERIAL
Acid-Base Excess: 9.3 mmol/L — ABNORMAL HIGH (ref 0.0–2.0)
Bicarbonate: 38.2 mEq/L — ABNORMAL HIGH (ref 20.0–24.0)
Drawn by: 313061
FIO2: 1 %
O2 Saturation: 97.8 %
Patient temperature: 98.6
TCO2: 33.4 mmol/L (ref 0–100)
pCO2 arterial: 72.1 mmHg (ref 35.0–45.0)
pH, Arterial: 7.344 — ABNORMAL LOW (ref 7.350–7.400)
pO2, Arterial: 117 mmHg — ABNORMAL HIGH (ref 80.0–100.0)

## 2011-06-12 LAB — INFLUENZA PANEL BY PCR (TYPE A & B)
H1N1 flu by pcr: NOT DETECTED
Influenza A By PCR: NEGATIVE
Influenza B By PCR: NEGATIVE

## 2011-06-12 LAB — CULTURE, BLOOD (ROUTINE X 2)
Culture  Setup Time: 201212071653
Culture  Setup Time: 201212071653
Culture: NO GROWTH
Culture: NO GROWTH

## 2011-06-12 LAB — HEMOGLOBIN A1C
Hgb A1c MFr Bld: 6.1 % — ABNORMAL HIGH (ref <5.7)
Mean Plasma Glucose: 128 mg/dL — ABNORMAL HIGH (ref <117)

## 2011-06-12 LAB — DIFFERENTIAL
Basophils Absolute: 0 10*3/uL (ref 0.0–0.1)
Basophils Relative: 0 % (ref 0–1)
Eosinophils Absolute: 0 10*3/uL (ref 0.0–0.7)
Eosinophils Relative: 0 % (ref 0–5)
Lymphocytes Relative: 20 % (ref 12–46)
Lymphs Abs: 1.1 10*3/uL (ref 0.7–4.0)
Monocytes Absolute: 0.3 10*3/uL (ref 0.1–1.0)
Monocytes Relative: 6 % (ref 3–12)
Neutro Abs: 4.1 10*3/uL (ref 1.7–7.7)
Neutrophils Relative %: 73 % (ref 43–77)

## 2011-06-12 LAB — BASIC METABOLIC PANEL
BUN: 17 mg/dL (ref 6–23)
CO2: 38 mEq/L — ABNORMAL HIGH (ref 19–32)
Calcium: 10.1 mg/dL (ref 8.4–10.5)
Chloride: 95 mEq/L — ABNORMAL LOW (ref 96–112)
Creatinine, Ser: 0.57 mg/dL (ref 0.50–1.10)
GFR calc Af Amer: >90 mL/min (ref >90)
GFR calc non Af Amer: >90 mL/min (ref >90)
Glucose, Bld: 145 mg/dL — ABNORMAL HIGH (ref 70–99)
Potassium: 4 mEq/L (ref 3.5–5.1)
Sodium: 138 mEq/L (ref 135–145)

## 2011-06-12 LAB — GLUCOSE, CAPILLARY
Glucose-Capillary: 155 mg/dL — ABNORMAL HIGH (ref 70–99)
Glucose-Capillary: 177 mg/dL — ABNORMAL HIGH (ref 70–99)
Glucose-Capillary: 105 mg/dL — ABNORMAL HIGH (ref 70–99)

## 2011-06-12 LAB — PRO B NATRIURETIC PEPTIDE: Pro B Natriuretic peptide (BNP): 159.7 pg/mL — ABNORMAL HIGH (ref 0–125)

## 2011-06-12 MED ORDER — ONDANSETRON HCL 4 MG/2ML IJ SOLN
4.0000 mg | Freq: Four times a day (QID) | INTRAMUSCULAR | Status: DC | PRN
  Administered 2011-06-12: 4 mg via INTRAVENOUS
  Filled 2011-06-12: qty 2

## 2011-06-12 MED ORDER — FUROSEMIDE 10 MG/ML IJ SOLN
40.0000 mg | Freq: Once | INTRAMUSCULAR | Status: AC
  Administered 2011-06-12: 40 mg via INTRAVENOUS
  Filled 2011-06-12: qty 4

## 2011-06-12 MED ORDER — ONDANSETRON HCL 4 MG PO TABS: 4.0000 mg | ORAL_TABLET | Freq: Four times a day (QID) | ORAL | Status: DC | PRN

## 2011-06-12 MED ORDER — OMEGA-3-ACID ETHYL ESTERS 1 G PO CAPS
1.0000 g | ORAL_CAPSULE | Freq: Every day | ORAL | Status: DC
  Administered 2011-06-12 – 2011-06-15 (×4): 1 g via ORAL
  Filled 2011-06-12 (×5): qty 1

## 2011-06-12 MED ORDER — ONDANSETRON HCL 4 MG/2ML IJ SOLN
4.0000 mg | Freq: Once | INTRAMUSCULAR | Status: AC
  Administered 2011-06-12: 4 mg via INTRAVENOUS
  Filled 2011-06-12: qty 2

## 2011-06-12 MED ORDER — ENOXAPARIN SODIUM 40 MG/0.4ML ~~LOC~~ SOLN
40.0000 mg | SUBCUTANEOUS | Status: DC
  Administered 2011-06-12 – 2011-06-15 (×4): 40 mg via SUBCUTANEOUS
  Filled 2011-06-12 (×5): qty 0.4

## 2011-06-12 MED ORDER — ASPIRIN EC 81 MG PO TBEC
81.0000 mg | DELAYED_RELEASE_TABLET | Freq: Every day | ORAL | Status: DC
  Administered 2011-06-12 – 2011-06-15 (×4): 81 mg via ORAL
  Filled 2011-06-12 (×5): qty 1

## 2011-06-12 MED ORDER — IPRATROPIUM BROMIDE 0.02 % IN SOLN: 0.5000 mg | RESPIRATORY_TRACT | Status: DC | PRN

## 2011-06-12 MED ORDER — ALBUTEROL SULFATE (5 MG/ML) 0.5% IN NEBU: 2.5000 mg | INHALATION_SOLUTION | RESPIRATORY_TRACT | Status: DC | PRN

## 2011-06-12 MED ORDER — ACETAMINOPHEN 325 MG PO TABS: 650.0000 mg | ORAL_TABLET | Freq: Four times a day (QID) | ORAL | Status: DC | PRN

## 2011-06-12 MED ORDER — DEXTROSE 5 % IV SOLN
1.0000 g | INTRAVENOUS | Status: DC
  Administered 2011-06-12 – 2011-06-15 (×4): 1 g via INTRAVENOUS
  Filled 2011-06-12 (×5): qty 10

## 2011-06-12 MED ORDER — VITAMIN D3 25 MCG (1000 UNIT) PO TABS
1000.0000 [IU] | ORAL_TABLET | Freq: Every day | ORAL | Status: DC
  Administered 2011-06-12 – 2011-06-15 (×4): 1000 [IU] via ORAL
  Filled 2011-06-12 (×5): qty 1

## 2011-06-12 MED ORDER — ACETAMINOPHEN 650 MG RE SUPP: 650.0000 mg | Freq: Four times a day (QID) | RECTAL | Status: DC | PRN

## 2011-06-12 MED ORDER — VALACYCLOVIR HCL 500 MG PO TABS
500.0000 mg | ORAL_TABLET | Freq: Every day | ORAL | Status: DC
  Filled 2011-06-12 (×5): qty 1

## 2011-06-12 MED ORDER — ALBUTEROL SULFATE (5 MG/ML) 0.5% IN NEBU
INHALATION_SOLUTION | RESPIRATORY_TRACT | Status: AC
  Filled 2011-06-12: qty 1

## 2011-06-12 MED ORDER — PANTOPRAZOLE SODIUM 40 MG PO TBEC
40.0000 mg | DELAYED_RELEASE_TABLET | Freq: Every day | ORAL | Status: DC
  Administered 2011-06-12 – 2011-06-15 (×4): 40 mg via ORAL
  Filled 2011-06-12 (×5): qty 1

## 2011-06-12 MED ORDER — LORATADINE 10 MG PO TABS
10.0000 mg | ORAL_TABLET | Freq: Every day | ORAL | Status: DC
  Administered 2011-06-13 – 2011-06-15 (×3): 10 mg via ORAL
  Filled 2011-06-12 (×5): qty 1

## 2011-06-12 MED ORDER — CALCIUM CARBONATE-VITAMIN D 500-200 MG-UNIT PO TABS
2.0000 | ORAL_TABLET | Freq: Every day | ORAL | Status: DC
  Administered 2011-06-12: 1 via ORAL
  Administered 2011-06-13 – 2011-06-15 (×3): 2 via ORAL
  Filled 2011-06-12 (×5): qty 2

## 2011-06-12 MED ORDER — BISOPROLOL-HYDROCHLOROTHIAZIDE 10-6.25 MG PO TABS
1.0000 | ORAL_TABLET | Freq: Every day | ORAL | Status: DC
  Administered 2011-06-12 – 2011-06-15 (×4): 1 via ORAL
  Filled 2011-06-12 (×5): qty 1

## 2011-06-12 MED ORDER — HYDROMORPHONE HCL PF 2 MG/ML IJ SOLN: 0.5000 mg | INTRAMUSCULAR | Status: DC | PRN

## 2011-06-12 MED ORDER — DEXTROSE 5 % IV SOLN
500.0000 mg | INTRAVENOUS | Status: DC
  Administered 2011-06-12 – 2011-06-15 (×4): 500 mg via INTRAVENOUS
  Filled 2011-06-12 (×5): qty 500

## 2011-06-12 MED ORDER — METHYLPREDNISOLONE SODIUM SUCC 125 MG IJ SOLR
60.0000 mg | INTRAMUSCULAR | Status: DC
  Administered 2011-06-12 – 2011-06-14 (×3): 60 mg via INTRAVENOUS
  Filled 2011-06-12 (×4): qty 2

## 2011-06-12 MED ORDER — ALBUTEROL SULFATE HFA 108 (90 BASE) MCG/ACT IN AERS
2.0000 | INHALATION_SPRAY | Freq: Four times a day (QID) | RESPIRATORY_TRACT | Status: DC | PRN
  Filled 2011-06-12: qty 6.7

## 2011-06-12 MED ORDER — INSULIN ASPART 100 UNIT/ML ~~LOC~~ SOLN
0.0000 [IU] | Freq: Three times a day (TID) | SUBCUTANEOUS | Status: DC
  Administered 2011-06-12: 2 [IU] via SUBCUTANEOUS
  Administered 2011-06-13: 3 [IU] via SUBCUTANEOUS
  Administered 2011-06-13: 1 [IU] via SUBCUTANEOUS
  Administered 2011-06-13: 2 [IU] via SUBCUTANEOUS
  Administered 2011-06-14 (×2): 3 [IU] via SUBCUTANEOUS
  Administered 2011-06-15 (×2): 2 [IU] via SUBCUTANEOUS
  Administered 2011-06-15: 5 [IU] via SUBCUTANEOUS
  Filled 2011-06-12: qty 3

## 2011-06-12 MED ORDER — IPRATROPIUM BROMIDE 0.02 % IN SOLN
0.5000 mg | Freq: Once | RESPIRATORY_TRACT | Status: AC
  Administered 2011-06-12: 0.5 mg via RESPIRATORY_TRACT

## 2011-06-12 MED ORDER — INSULIN ASPART 100 UNIT/ML ~~LOC~~ SOLN
0.0000 [IU] | Freq: Every day | SUBCUTANEOUS | Status: DC
  Administered 2011-06-14: 2 [IU] via SUBCUTANEOUS

## 2011-06-12 MED ORDER — NICOTINE 14 MG/24HR TD PT24
14.0000 mg | MEDICATED_PATCH | Freq: Every day | TRANSDERMAL | Status: DC
  Administered 2011-06-12 – 2011-06-15 (×4): 14 mg via TRANSDERMAL
  Filled 2011-06-12 (×5): qty 1

## 2011-06-12 NOTE — ED Notes (Signed)
Pt sleeping comfortably at this time. Family at bedside. O2 sat at 100%.

## 2011-06-12 NOTE — Progress Notes (Signed)
UTILIZATION REVIEW COMPLETE PT WAS DC'D ON 12/12 WITH HH PT AND O2 ARRANGED WITH [facility] Willa Rough 06/12/2011 [phone] OR [phone]

## 2011-06-12 NOTE — ED Notes (Signed)
ZOX:[id number]<AV> Expected date:<BR> Expected time:<BR> Means of arrival:<BR> Comments:<BR> EMS/SOB

## 2011-06-12 NOTE — H&P (Signed)
PCP:   Carollee Herter, MD, MD   Chief Complaint:  Worsening sob since last night.   HPI: 65 year old lady with history of hypertension and hyperlipidemia was admitted to [facility] on Friday for worsening shortness of breath she was diagnosed to have COPD exacerbation and she was appropriately treated and discharged yesterday . She was discharged yesterday patient continued to have him shortness of breath, and she was brought she was seen on the ED for further evaluation. In ED she was found to be hypoxic hypoxic with O2 saturations in the low 80s on 4 L of nasal cannula oxygen. Hospitalist were requested to admit the patient for further evaluation of hypoxic respiratory failure. Currently patient is on 50% Ventimask saturating 98-100%. Patient denies any chest pain has minimal cough. She denies any palpitations syncopal episodes. Has per the husband who is at the bedside he returns her having chills, no fever. She denies any nausea vomiting or abdominal pain. Chest x-ray done in the ED was found to have increased pulmonary vascular congestion. Review of Systems:  The patient denies anorexia, fever, weight loss,, vision loss, decreased hearing, hoarseness, chest pain, syncope, , peripheral edema, balance deficits, hemoptysis, abdominal pain, melena, hematochezia, severe indigestion/heartburn, hematuria, incontinence, genital sores, muscle weakness, suspicious skin lesions, transient blindness, difficulty walking, depression, unusual weight change, abnormal bleeding, enlarged lymph nodes, angioedema, and breast masses.  Past Medical History: Past Medical History  Diagnosis Date  . Allergy   . Elevated cholesterol   . Hypertension   . Genital HSV     gets on hip  . Diverticulosis   . 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 Vitamin D deficiency disease   . Osteopenia   . Migraine   . Elevated liver enzymes     fatty liver per ultrasound per pt  . Impaired glucose tolerance   . Colon  polyp    Past Surgical History  Procedure Date  . Mastectomy 1992    R breast for cancer  . Colonoscopy 2009, 08/2010    Dr. Loreta Ave  . Cholecystectomy 2003  . Hernia repair 2003    umbilical    Medications: Prior to Admission medications   Medication Sig Start Date End Date Taking? Authorizing Provider  albuterol (PROVENTIL HFA;VENTOLIN HFA) 108 (90 BASE) MCG/ACT inhaler Inhale 2 puffs into the lungs every 6 (six) hours as needed for wheezing. 06/04/11 06/03/12 Yes Eve Carleene Overlie, MD  amoxicillin-clavulanate (AUGMENTIN) 875-125 MG per tablet Take 1 tablet by mouth every 12 (twelve) hours. 06/10/11 06/20/11 Yes Simbiso Ranga  aspirin EC 81 MG tablet Take 81 mg by mouth daily.     Yes Historical Provider, MD  bisoprolol-hydrochlorothiazide (ZIAC) 10-6.25 MG per tablet Take 1 tablet by mouth daily.     Yes Historical Provider, MD  Calcium Carbonate-Vitamin D (CALCIUM 600+D) 600-400 MG-UNIT per tablet Take 2 tablets by mouth daily.     Yes Historical Provider, MD  cetirizine (ZYRTEC) 10 MG tablet Take 10 mg by mouth daily.     Yes Historical Provider, MD  cholecalciferol (VITAMIN D) 1000 UNITS tablet Take 1,000 Units by mouth daily.     Yes Historical Provider, MD  Multiple Vitamins-Minerals (CENTRUM SILVER PO) Take 1 tablet by mouth.     Yes Historical Provider, MD  Omega-3 Fatty Acids (FISH OIL) 1200 MG CAPS Take 2 capsules by mouth daily.    Yes Historical Provider, MD  ondansetron (ZOFRAN) 4 MG tablet Take 1 tablet (4 mg total) by mouth  every 6 (six) hours as needed for nausea. 06/10/11 06/17/11 Yes Simbiso Ranga  pantoprazole (PROTONIX) 40 MG tablet Take 1 tablet (40 mg total) by mouth daily at 12 noon. 06/10/11 06/09/12 Yes Simbiso Ranga  predniSONE (DELTASONE) 20 MG tablet Take 1 tablet (20 mg total) by mouth daily with breakfast. 06/10/11 06/20/11 Yes Simbiso Ranga  Probiotic Product (HEALTHY COLON PO) Take 1 tablet by mouth daily.     Yes Historical Provider, MD  simvastatin (ZOCOR) 20 MG  tablet Take 20 mg by mouth at bedtime.     Yes Historical Provider, MD  valACYclovir (VALTREX) 1000 MG tablet Take 500 mg by mouth daily.    Yes Historical Provider, MD    Allergies:   Allergies  Allergen Reactions  . Contrast Media (Iodinated Diagnostic Agents) Other (See Comments)    Could not breath  . Iohexol Other (See Comments)    Immediately could not breathe  . Levaquin Other (See Comments)    insomnia  . Sulfa Antibiotics Other (See Comments)    Historical from mother, pt states that mother says she almost died from this drug  . Codeine Nausea Only, Anxiety and Other (See Comments)    insomnia  . Lipitor (Atorvastatin Calcium) Rash    Social History:  reports that she has been smoking.  She does not have any smokeless tobacco history on file. She reports that she drinks alcohol. She reports that she does not use illicit drugs.  Family History: Family History  Problem Relation Age of Onset  . Heart disease Father   . Diabetes Father   . Hypertension Father   . Asthma Sister   . Heart disease Mother     tachycardia  . Asthma Sister   . Cancer Sister 71    breast cancer    Physical Exam: Filed Vitals:   06/12/11 0411 06/12/11 0425 06/12/11 0738 06/12/11 1001  BP: 151/86 194/74 159/72   Pulse: 90 93 99   Temp:  97.6 F (36.4 C)    TempSrc:  Oral    Resp: 19 30 21    SpO2: 100% 93% 97% 97%   Constitutional: Vital signs reviewed.  Patient is a well-developed and well-nourished in no acute distress  Head: Normocephalic and atraumatic Ear: TM normal bilaterally Mouth: no erythema or exudates, MMM Eyes: PERRL, EOMI, conjunctivae normal, No scleral icterus.  Neck: Supple, Trachea midline normal ROM, No JVD, Cardiovascular: RRR, S1 normal, S2 normal, no MRG, pulses symmetric and intact bilaterally Pulmonary/Chest: Decreased air entry at bases.  Bilateral scattered expiratory wheezing present Abdominal: Soft. Non-tender, non-distended, bowel sounds are normal, no  masses, organomegaly, or guarding present.   Musculoskeletal: No joint deformities, erythema, or stiffness, ROM full and no nontender  Neurological: A&O x3, no focal deficits.  Skin: Warm, dry and intact. No rash, cyanosis, or clubbing.        Labs on Admission:   [facility] 06/12/11 0615 06/11/11 0522  NA 138 143  K 4.0 4.6  CL 95* 94*  CO2 38* 38*  GLUCOSE 145* 122*  BUN 17 16  CREATININE 0.57 0.60  CALCIUM 10.1 10.3  MG -- --  PHOS -- --    Basename 06/11/11 0522  AST 58*  ALT 94*  ALKPHOS 46  BILITOT 0.5  PROT 7.1  ALBUMIN 3.8   No results found for this basename: LIPASE:2,AMYLASE:2 in the last 72 hours  Basename 06/12/11 0615 06/11/11 0522  WBC 5.7 6.2  NEUTROABS 4.1 --  HGB 15.1* 15.1*  HCT 44.9  45.2  MCV 90.2 92.2  PLT 165 147*   No results found for this basename: CKTOTAL:3,CKMB:3,CKMBINDEX:3,TROPONINI:3 in the last 72 hours No results found for this basename: TSH,T4TOTAL,FREET3,T3FREE,THYROIDAB in the last 72 hours No results found for this basename: VITAMINB12:2,FOLATE:2,FERRITIN:2,TIBC:2,IRON:2,RETICCTPCT:2 in the last 72 hours  Radiological Exams on Admission: Dg Chest 2 View  06/11/2011  *RADIOLOGY REPORT*  Clinical Data: Pneumonia.Hypertension.  Diabetes.  CHEST - 2 VIEW  Comparison: 06/07/2011.  Findings: There is persistent patchy airspace disease in the right lower lobe.  Emphysema is present.  Compared to prior there is little interval change.  Flattening of hemidiaphragms is present. Blunting of the costophrenic angles is probably associated with emphysema rather than pleural effusions.  Cardiopericardial silhouette and mediastinal contours are unchanged.  Right axillary dissection clips. Radiographic follow-up is recommended to ensure clearing and exclude an underlying lesion.  Radiographic clearing is usually observed at 4-6 weeks.  IMPRESSION: Patchy right lower lobe airspace density is unchanged compared to prior.  Underlying emphysema.   Original Report Authenticated By: Andreas Newport, M.D.   X-ray Chest Pa And Lateral   06/07/2011  *RADIOLOGY REPORT*  Clinical Data: Pneumonia.  Cough and congestion.  Short of breath.  CHEST - 2 VIEW  Comparison: 06/06/2011.  Findings: Right axillary dissection clips are present. Density is present in the right lower lobe on the lateral view, which may represent atelectasis or airspace disease.  Hyperinflation is present suggesting emphysema.  No pleural effusion is identified. Cardiopericardial silhouette appears within normal limits.  Aortic arch atherosclerosis.  Trachea midline. Compared to prior exam, pulmonary aeration is improved.  IMPRESSION: Right lower lobe airspace disease or atelectasis. Pneumonia cannot be excluded.  Emphysema.  Original Report Authenticated By: Andreas Newport, M.D.   US Abdomen Complete  06/06/2011  *RADIOLOGY REPORT*  Clinical Data:  Elevated liver function tests.  Surgical history includes cholecystectomy.  History of breast cancer.  Prior history of hepatomegaly and splenomegaly.  COMPLETE ABDOMINAL ULTRASOUND 06/06/2011:  Comparison:  CT abdomen and pelvis 06/28/2009 [facility].  Findings:  Gallbladder:  Surgically absent.  Common bile duct:  Normal in caliber post cholecystectomy with maximum diameter approximating 7 mm.  Liver:  Enlarged with diffusely increased and coarsened echotexture without focal parenchymal abnormality.  Patent portal vein with hepatopetal flow.  IVC:  Patent.  Pancreas:  Although the pancreas is difficult to visualize in its entirety, no focal pancreatic abnormality is identified.  Spleen:  Enlarged, measuring approximately 13.3 x 16.0 x 5.1 cm, yielding a volume of approximately 563 ml.  No focal splenic parenchymal abnormality.  Right Kidney:  No hydronephrosis.  Well-preserved cortex.  No shadowing calculi.  Normal size and parenchymal echotexture without focal abnormalities.  Approximately 13.4 cm in length.  Left Kidney:  No  hydronephrosis.  Well-preserved cortex.  No shadowing calculi.  Normal size and parenchymal echotexture without focal abnormalities.  Approximately 14.3 cm in length.  Abdominal aorta:  Normal in caliber throughout its visualized course in the abdomen with mild to moderate atherosclerosis.  IMPRESSION:  1.  Hepatomegaly with diffuse hepatic steatosis and/or hepatocellular disease.  No focal hepatic parenchymal abnormality. 2.  Splenomegaly without focal splenic parenchymal abnormality. 3.  Abdominal aortic atherosclerosis without aneurysm.  Original Report Authenticated By: Arnell Sieving, M.D.   Dg Chest Portable 1 View  06/12/2011  *RADIOLOGY REPORT*  Clinical Data: Acute shortness of breath and wheezing  PORTABLE CHEST - 1 VIEW  Comparison: 06/11/2011  Findings: Normal heart size.  Mild increasing pulmonary vascularity which might  represent early congestion.  No edema.  No blunting of costophrenic angles.  Infiltration or atelectasis in the right lung base again demonstrated.  IMPRESSION: Slightly increased pulmonary vascularity since the previous study may suggest developing congestion.  Heart size is normal. Atelectasis or infiltration in the right lung base is again demonstrated without change.  Original Report Authenticated By: Marlon Pel, M.D.   Dg Chest Portable 1 View  06/06/2011  *RADIOLOGY REPORT*  Clinical Data: Some breath.  PORTABLE CHEST - 1 VIEW  Comparison: None.  Findings: The chest is hyperexpanded.  Interstitial opacities predominantly in the bases are compatible with edema.  No consolidative process.  No pneumothorax or pleural effusion.  IMPRESSION:  1.  Findings compatible with mild interstitial edema. 2.  Pulmonary hyperexpansion compatible with emphysema.  Original Report Authenticated By: Bernadene Bell. Maricela Curet, M.D.    Assessment/Plan Present on Admission:  .Hypoxemia: Acute on Chronic respiratory failure secondary to copd exacerbation: Restart the patient on iv  solumedrol 60mg  Q24hrs, resume bronchodilator therapy with nebulizations as needed. Restart the rocephin and zithromax. Will try to wean the pt off the venti mask to nasal oxygen. ABG showed hypercarbia, mostly from copd. Cxr showed pulmonary vascular congestion, will give her a dose of lasix and get a 2 d echocardiogram to evaluate for any diastolic dysfunction.  If she doesn't improve will put her on Bi Pap as needed. Pro BNP pending. Sputum cultures & Influenza PCR  Ordered.  .Tobacco abuse: tobacco cessation consult. Will order Nicotine patch. Hypertension; controlled. Continue with her home medications H/o Diabetes: We'll get a hemoglobin A1c in put her on sliding scale insulin. DVT prophylaxis subcutaneous Lovenox dosing as per the pharmacy. History of elevated transaminases: Secondary to fatty liver disease we'll continue to monitor.  Savayah Waltrip 06/12/2011, 11:04 AM

## 2011-06-12 NOTE — ED Provider Notes (Signed)
History     CSN: [id number] Arrival date & time: 06/12/2011  4:11 AM   First MD Initiated Contact with Patient 06/12/11 [id number]      Chief Complaint  Patient presents with  . Shortness of Breath    (Consider location/radiation/quality/duration/timing/severity/associated sxs/prior treatment) HPI This is a 65 year old white female who was admitted to [facility] about 6 days ago for pneumonia and shortness of breath. She was treated with antibiotics and supplemental oxygen and discharged home yesterday afternoon about 3PM. She was satting in the 90s on 4 L at time of discharge. She had the gradual onset of shortness of breath beginning last night about 9 PM. It worsened to the point that she was in distress this morning and her husband called EMS. EMS reports she was dyspneic and had an O2 sat of 88% on 4 L. She was given a treatment prior to arrival with improvement in her symptoms. They state she was cyanotic until they administered additional oxygen. At this time she states she is comfortable and her only complaint is nausea. She denies fever or vomiting. She states she had not used her albuterol this morning prior to calling EMS.  Past Medical History  Diagnosis Date  . Allergy   . Elevated cholesterol   . Hypertension   . Genital HSV     gets on hip  . Diverticulosis   . 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 Vitamin D deficiency disease   . Osteopenia   . Migraine   . Elevated liver enzymes     fatty liver per ultrasound per pt  . Impaired glucose tolerance   . Colon polyp     Past Surgical History  Procedure Date  . Mastectomy 1992    R breast for cancer  . Colonoscopy 2009, 08/2010    Dr. Loreta Ave  . Cholecystectomy 2003  . Hernia repair 2003    umbilical    Family History  Problem Relation Age of Onset  . Heart disease Father   . Diabetes Father   . Hypertension Father   . Asthma Sister   . Heart disease Mother     tachycardia  . Asthma Sister   . Cancer  Sister 28    breast cancer    History  Substance Use Topics  . Smoking status: Current Everyday Smoker -- 1.0 packs/day for 46 years  . Smokeless tobacco: Not on file   Comment: cut back to a little over 1/2 PPD  . Alcohol Use: Yes     rarely, 1 drink per year maybe    OB History    Grav Para Term Preterm Abortions TAB SAB Ect Mult Living                  Review of Systems  Allergies  Contrast media; Iohexol; Levaquin; Sulfa antibiotics; Codeine; and Lipitor  Home Medications   Current Outpatient Rx  Name Route Sig Dispense Refill  . ALBUTEROL SULFATE HFA 108 (90 BASE) MCG/ACT IN AERS Inhalation Inhale 2 puffs into the lungs every 6 (six) hours as needed for wheezing. 18 g 0  . AMOXICILLIN-POT CLAVULANATE 875-125 MG PO TABS Oral Take 1 tablet by mouth every 12 (twelve) hours. 10 tablet 0  . ASPIRIN EC 81 MG PO TBEC Oral Take 81 mg by mouth daily.      Marland Kitchen BISOPROLOL-HYDROCHLOROTHIAZIDE 10-6.25 MG PO TABS Oral Take 1 tablet by mouth daily.      Marland Kitchen CALCIUM CARBONATE-VITAMIN D  600-400 MG-UNIT PO TABS Oral Take 2 tablets by mouth daily.      Marland Kitchen CETIRIZINE HCL 10 MG PO TABS Oral Take 10 mg by mouth daily.      Marland Kitchen VITAMIN D 1000 UNITS PO TABS Oral Take 1,000 Units by mouth daily.      . CENTRUM SILVER PO Oral Take 1 tablet by mouth.      Marland Kitchen FISH OIL 1200 MG PO CAPS Oral Take 2 capsules by mouth daily.     Marland Kitchen ONDANSETRON HCL 4 MG PO TABS Oral Take 1 tablet (4 mg total) by mouth every 6 (six) hours as needed for nausea. 20 tablet 0  . PANTOPRAZOLE SODIUM 40 MG PO TBEC Oral Take 1 tablet (40 mg total) by mouth daily at 12 noon. 15 tablet 0  . PREDNISONE 20 MG PO TABS Oral Take 1 tablet (20 mg total) by mouth daily with breakfast. 4 tablet 0  . HEALTHY COLON PO Oral Take 1 tablet by mouth daily.      Marland Kitchen SIMVASTATIN 20 MG PO TABS Oral Take 20 mg by mouth at bedtime.      Marland Kitchen VALACYCLOVIR HCL 1 G PO TABS Oral Take 500 mg by mouth daily.       BP 194/74  Pulse 93  Temp(Src) 97.6 F (36.4 C)  (Oral)  Resp 30  SpO2 93%  Physical Exam General: Well-developed, well-nourished female in no acute distress; appearance consistent with age of record HENT: normocephalic, atraumatic Eyes: pupils equal round and reactive to light; extraocular muscles intact Neck: supple Heart: regular rate and rhythm Lungs: Distant sounds; tachypnea; a few basilar rales Abdomen: soft; nontender; nondistended Extremities: No deformity; full range of motion; pulses normal; trace edema of lower legs Neurologic: Awake, alert; motor function intact in all extremities and symmetric; no facial droop Skin: Warm and dry Psychiatric: Anxious    ED Course  Procedures (including critical care time)    MDM   Nursing notes and vitals signs, including pulse oximetry, reviewed.  Results for orders placed during the hospital encounter of 06/12/11  BASIC METABOLIC PANEL      Component Value Range   Sodium 138  135 - 145 (mEq/L)   Potassium 4.0  3.5 - 5.1 (mEq/L)   Chloride 95 (*) 96 - 112 (mEq/L)   CO2 38 (*) 19 - 32 (mEq/L)   Glucose, Bld 145 (*) 70 - 99 (mg/dL)   BUN 17  6 - 23 (mg/dL)   Creatinine, Ser 1.61  0.50 - 1.10 (mg/dL)   Calcium 09.6  8.4 - 10.5 (mg/dL)   GFR calc non Af Amer >90  >90 (mL/min)   GFR calc Af Amer >90  >90 (mL/min)  CBC      Component Value Range   WBC 5.7  4.0 - 10.5 (K/uL)   RBC 4.98  3.87 - 5.11 (MIL/uL)   Hemoglobin 15.1 (*) 12.0 - 15.0 (g/dL)   HCT 04.5  40.9 - 81.1 (%)   MCV 90.2  78.0 - 100.0 (fL)   MCH 30.3  26.0 - 34.0 (pg)   MCHC 33.6  30.0 - 36.0 (g/dL)   RDW 91.4  78.2 - 95.6 (%)   Platelets 165  150 - 400 (K/uL)  DIFFERENTIAL      Component Value Range   Neutrophils Relative 73  43 - 77 (%)   Neutro Abs 4.1  1.7 - 7.7 (K/uL)   Lymphocytes Relative 20  12 - 46 (%)   Lymphs Abs 1.1  0.7 - 4.0 (K/uL)   Monocytes Relative 6  3 - 12 (%)   Monocytes Absolute 0.3  0.1 - 1.0 (K/uL)   Eosinophils Relative 0  0 - 5 (%)   Eosinophils Absolute 0.0  0.0 - 0.7  (K/uL)   Basophils Relative 0  0 - 1 (%)   Basophils Absolute 0.0  0.0 - 0.1 (K/uL)  PRO B NATRIURETIC PEPTIDE      Component Value Range   Pro B Natriuretic peptide (BNP) 159.7 (*) 0 - 125 (pg/mL)  BLOOD GAS, ARTERIAL      Component Value Range   FIO2 1.00     Delivery systems NON-REBREATHER OXYGEN MASK     pH, Arterial 7.344 (*) 7.350 - 7.400    pCO2 arterial 72.1 (*) 35.0 - 45.0 (mmHg)   pO2, Arterial 117.0 (*) 80.0 - 100.0 (mmHg)   Bicarbonate 38.2 (*) 20.0 - 24.0 (mEq/L)   TCO2 33.4  0 - 100 (mmol/L)   Acid-Base Excess 9.3 (*) 0.0 - 2.0 (mmol/L)   O2 Saturation 97.8     Patient temperature 98.6     Collection site RIGHT RADIAL     Drawn by [id number]     Sample type ARTERIAL DRAW     Allens test (pass/fail) PASS  PASS     Dg Chest Portable 1 View  06/12/2011  *RADIOLOGY REPORT*  Clinical Data: Acute shortness of breath and wheezing  PORTABLE CHEST - 1 VIEW  Comparison: 06/11/2011  Findings: Normal heart size.  Mild increasing pulmonary vascularity which might represent early congestion.  No edema.  No blunting of costophrenic angles.  Infiltration or atelectasis in the right lung base again demonstrated.  IMPRESSION: Slightly increased pulmonary vascularity since the previous study may suggest developing congestion.  Heart size is normal. Atelectasis or infiltration in the right lung base is again demonstrated without change.  Original Report Authenticated By: Marlon Pel, M.D.   7:16 AM O2 sat 88% on 4 L. Will have patient admitted to hospital service.         Hanley Seamen, MD 06/12/11 [phone]

## 2011-06-12 NOTE — ED Notes (Signed)
ABG values received and reported to MD---advises change to 4 liters 02 via nasal cannula and monitor 02 sat---within few minutes on 4 liters, 02 sat dropped to 88--pt. Became more anxious and labored---MD notified and verbal order for her to be placed back on mask and titrate 02 to keep sat above 90--placed on rebreather --10 liters--02 sat rose to 91% within few minutes and pt. Less labored.

## 2011-06-12 NOTE — ED Notes (Signed)
Pt awake and resting. Family at bedside. Sat 97% on 10L NRB at this time. NAD.

## 2011-06-12 NOTE — Progress Notes (Signed)
  Echocardiogram 2D Echocardiogram has been performed.  Jorje Guild [facility] 06/12/2011, 3:14 PM

## 2011-06-12 NOTE — ED Notes (Signed)
Shortness of air onset 9 p.m. With gradual worsening---just released from [facility] yesterday for respiratory infection.

## 2011-06-13 LAB — GLUCOSE, CAPILLARY
Glucose-Capillary: 130 mg/dL — ABNORMAL HIGH (ref 70–99)
Glucose-Capillary: 169 mg/dL — ABNORMAL HIGH (ref 70–99)
Glucose-Capillary: 214 mg/dL — ABNORMAL HIGH (ref 70–99)
Glucose-Capillary: 187 mg/dL — ABNORMAL HIGH (ref 70–99)

## 2011-06-13 LAB — COMPREHENSIVE METABOLIC PANEL
ALT: 80 U/L — ABNORMAL HIGH (ref 0–35)
AST: 40 U/L — ABNORMAL HIGH (ref 0–37)
Albumin: 3.6 g/dL (ref 3.5–5.2)
Alkaline Phosphatase: 44 U/L (ref 39–117)
BUN: 20 mg/dL (ref 6–23)
CO2: 39 mEq/L — ABNORMAL HIGH (ref 19–32)
Calcium: 10.5 mg/dL (ref 8.4–10.5)
Chloride: 96 mEq/L (ref 96–112)
Creatinine, Ser: 0.69 mg/dL (ref 0.50–1.10)
GFR calc Af Amer: >90 mL/min (ref >90)
GFR calc non Af Amer: 89 mL/min — ABNORMAL LOW (ref >90)
Glucose, Bld: 110 mg/dL — ABNORMAL HIGH (ref 70–99)
Potassium: 3.9 mEq/L (ref 3.5–5.1)
Sodium: 140 mEq/L (ref 135–145)
Total Bilirubin: 0.5 mg/dL (ref 0.3–1.2)
Total Protein: 7 g/dL (ref 6.0–8.3)

## 2011-06-13 LAB — CBC
HCT: 44 % (ref 36.0–46.0)
Hemoglobin: 14.2 g/dL (ref 12.0–15.0)
MCH: 29.8 pg (ref 26.0–34.0)
MCHC: 32.3 g/dL (ref 30.0–36.0)
MCV: 92.4 fL (ref 78.0–100.0)
Platelets: 191 10*3/uL (ref 150–400)
RBC: 4.76 MIL/uL (ref 3.87–5.11)
RDW: 13.6 % (ref 11.5–15.5)
WBC: 6.8 10*3/uL (ref 4.0–10.5)

## 2011-06-13 LAB — DIFFERENTIAL
Basophils Absolute: 0 10*3/uL (ref 0.0–0.1)
Basophils Relative: 0 % (ref 0–1)
Eosinophils Absolute: 0.1 10*3/uL (ref 0.0–0.7)
Eosinophils Relative: 1 % (ref 0–5)
Lymphocytes Relative: 26 % (ref 12–46)
Lymphs Abs: 1.8 10*3/uL (ref 0.7–4.0)
Monocytes Absolute: 0.6 10*3/uL (ref 0.1–1.0)
Monocytes Relative: 8 % (ref 3–12)
Neutro Abs: 4.4 10*3/uL (ref 1.7–7.7)
Neutrophils Relative %: 65 % (ref 43–77)

## 2011-06-13 MED ORDER — ALPRAZOLAM 0.25 MG PO TABS
0.2500 mg | ORAL_TABLET | Freq: Two times a day (BID) | ORAL | Status: DC
  Administered 2011-06-13 – 2011-06-15 (×5): 0.25 mg via ORAL
  Filled 2011-06-13 (×5): qty 1

## 2011-06-13 NOTE — Progress Notes (Signed)
Subjective: Pt is awake, sitting in the chair on 2 liters of nasal oxygen. She is anxious about her breathing. She is saturating 92% on 2 lit of oxygen.   Objective: Weight change:   Intake/Output Summary (Last 24 hours) at 06/13/11 2112 Last data filed at 06/13/11 1700  Gross per 24 hour  Intake    840 ml  Output      0 ml  Net    840 ml   Exam  She is alert afebrile and slightly anxious CVS: heart sounds normal Lungs: no wheezing heard, good air entry bilateral. Abdomen: benign Extremities: no edema.  Lab Results: Results for orders placed during the hospital encounter of 06/12/11 (from the past 24 hour(s))  GLUCOSE, CAPILLARY     Status: Abnormal   Collection Time   06/12/11  9:45 PM      Component Value Range   Glucose-Capillary 105 (*) 70 - 99 (mg/dL)  COMPREHENSIVE METABOLIC PANEL     Status: Abnormal   Collection Time   06/13/11  4:45 AM      Component Value Range   Sodium 140  135 - 145 (mEq/L)   Potassium 3.9  3.5 - 5.1 (mEq/L)   Chloride 96  96 - 112 (mEq/L)   CO2 39 (*) 19 - 32 (mEq/L)   Glucose, Bld 110 (*) 70 - 99 (mg/dL)   BUN 20  6 - 23 (mg/dL)   Creatinine, Ser 0.45  0.50 - 1.10 (mg/dL)   Calcium 40.9  8.4 - 10.5 (mg/dL)   Total Protein 7.0  6.0 - 8.3 (g/dL)   Albumin 3.6  3.5 - 5.2 (g/dL)   AST 40 (*) 0 - 37 (U/L)   ALT 80 (*) 0 - 35 (U/L)   Alkaline Phosphatase 44  39 - 117 (U/L)   Total Bilirubin 0.5  0.3 - 1.2 (mg/dL)   GFR calc non Af Amer 89 (*) >90 (mL/min)   GFR calc Af Amer >90  >90 (mL/min)  CBC     Status: Normal   Collection Time   06/13/11  4:45 AM      Component Value Range   WBC 6.8  4.0 - 10.5 (K/uL)   RBC 4.76  3.87 - 5.11 (MIL/uL)   Hemoglobin 14.2  12.0 - 15.0 (g/dL)   HCT 81.1  91.4 - 78.2 (%)   MCV 92.4  78.0 - 100.0 (fL)   MCH 29.8  26.0 - 34.0 (pg)   MCHC 32.3  30.0 - 36.0 (g/dL)   RDW 95.6  21.3 - 08.6 (%)   Platelets 191  150 - 400 (K/uL)  DIFFERENTIAL     Status: Normal   Collection Time   06/13/11  4:45 AM   Component Value Range   Neutrophils Relative 65  43 - 77 (%)   Neutro Abs 4.4  1.7 - 7.7 (K/uL)   Lymphocytes Relative 26  12 - 46 (%)   Lymphs Abs 1.8  0.7 - 4.0 (K/uL)   Monocytes Relative 8  3 - 12 (%)   Monocytes Absolute 0.6  0.1 - 1.0 (K/uL)   Eosinophils Relative 1  0 - 5 (%)   Eosinophils Absolute 0.1  0.0 - 0.7 (K/uL)   Basophils Relative 0  0 - 1 (%)   Basophils Absolute 0.0  0.0 - 0.1 (K/uL)  GLUCOSE, CAPILLARY     Status: Abnormal   Collection Time   06/13/11  7:10 AM      Component Value Range   Glucose-Capillary  130 (*) 70 - 99 (mg/dL)  GLUCOSE, CAPILLARY     Status: Abnormal   Collection Time   06/13/11 12:51 PM      Component Value Range   Glucose-Capillary 169 (*) 70 - 99 (mg/dL)  GLUCOSE, CAPILLARY     Status: Abnormal   Collection Time   06/13/11  4:43 PM      Component Value Range   Glucose-Capillary 214 (*) 70 - 99 (mg/dL)  .  Micro Results: Recent Results (from the past 240 hour(s))  CULTURE, BLOOD (ROUTINE X 2)     Status: Normal   Collection Time   06/06/11 11:53 AM      Component Value Range Status Comment   Specimen Description BLOOD LEFT ARM   Final    Special Requests BOTTLES DRAWN AEROBIC AND ANAEROBIC 10CC   Final    Setup Time [id number]   Final    Culture NO GROWTH 5 DAYS   Final    Report Status 06/12/2011 FINAL   Final   CULTURE, BLOOD (ROUTINE X 2)     Status: Normal   Collection Time   06/06/11 12:04 PM      Component Value Range Status Comment   Specimen Description BLOOD LEFT ARM   Final    Special Requests BOTTLES DRAWN AEROBIC AND ANAEROBIC 10CC   Final    Setup Time [id number]   Final    Culture NO GROWTH 5 DAYS   Final    Report Status 06/12/2011 FINAL   Final   CULTURE, SPUTUM-ASSESSMENT     Status: Normal   Collection Time   06/07/11  7:30 AM      Component Value Range Status Comment   Specimen Description SPUTUM   Final    Special Requests NONE   Final    Sputum evaluation     Final    Value: MICROSCOPIC FINDINGS  SUGGEST THAT THIS SPECIMEN IS NOT REPRESENTATIVE OF LOWER RESPIRATORY SECRETIONS. PLEASE RECOLLECT.     CALLED TO Filomena Jungling 06/07/11  1215 EHOWARD   Report Status 06/07/2011 FINAL   Final     Studies/Results: Dg Chest 2 View  06/11/2011  *RADIOLOGY REPORT*  Clinical Data: Pneumonia.Hypertension.  Diabetes.  CHEST - 2 VIEW  Comparison: 06/07/2011.  Findings: There is persistent patchy airspace disease in the right lower lobe.  Emphysema is present.  Compared to prior there is little interval change.  Flattening of hemidiaphragms is present. Blunting of the costophrenic angles is probably associated with emphysema rather than pleural effusions.  Cardiopericardial silhouette and mediastinal contours are unchanged.  Right axillary dissection clips. Radiographic follow-up is recommended to ensure clearing and exclude an underlying lesion.  Radiographic clearing is usually observed at 4-6 weeks.  IMPRESSION: Patchy right lower lobe airspace density is unchanged compared to prior.  Underlying emphysema.  Original Report Authenticated By: Andreas Newport, M.D.   X-ray Chest Pa And Lateral   06/07/2011  *RADIOLOGY REPORT*  Clinical Data: Pneumonia.  Cough and congestion.  Short of breath.  CHEST - 2 VIEW  Comparison: 06/06/2011.  Findings: Right axillary dissection clips are present. Density is present in the right lower lobe on the lateral view, which may represent atelectasis or airspace disease.  Hyperinflation is present suggesting emphysema.  No pleural effusion is identified. Cardiopericardial silhouette appears within normal limits.  Aortic arch atherosclerosis.  Trachea midline. Compared to prior exam, pulmonary aeration is improved.  IMPRESSION: Right lower lobe airspace disease or atelectasis. Pneumonia cannot be excluded.  Emphysema.  Original Report  Authenticated By: Andreas Newport, M.D.   US Abdomen Complete  06/06/2011  *RADIOLOGY REPORT*  Clinical Data:  Elevated liver function tests.  Surgical  history includes cholecystectomy.  History of breast cancer.  Prior history of hepatomegaly and splenomegaly.  COMPLETE ABDOMINAL ULTRASOUND 06/06/2011:  Comparison:  CT abdomen and pelvis 06/28/2009 [facility].  Findings:  Gallbladder:  Surgically absent.  Common bile duct:  Normal in caliber post cholecystectomy with maximum diameter approximating 7 mm.  Liver:  Enlarged with diffusely increased and coarsened echotexture without focal parenchymal abnormality.  Patent portal vein with hepatopetal flow.  IVC:  Patent.  Pancreas:  Although the pancreas is difficult to visualize in its entirety, no focal pancreatic abnormality is identified.  Spleen:  Enlarged, measuring approximately 13.3 x 16.0 x 5.1 cm, yielding a volume of approximately 563 ml.  No focal splenic parenchymal abnormality.  Right Kidney:  No hydronephrosis.  Well-preserved cortex.  No shadowing calculi.  Normal size and parenchymal echotexture without focal abnormalities.  Approximately 13.4 cm in length.  Left Kidney:  No hydronephrosis.  Well-preserved cortex.  No shadowing calculi.  Normal size and parenchymal echotexture without focal abnormalities.  Approximately 14.3 cm in length.  Abdominal aorta:  Normal in caliber throughout its visualized course in the abdomen with mild to moderate atherosclerosis.  IMPRESSION:  1.  Hepatomegaly with diffuse hepatic steatosis and/or hepatocellular disease.  No focal hepatic parenchymal abnormality. 2.  Splenomegaly without focal splenic parenchymal abnormality. 3.  Abdominal aortic atherosclerosis without aneurysm.  Original Report Authenticated By: Arnell Sieving, M.D.   Dg Chest Portable 1 View  06/12/2011  *RADIOLOGY REPORT*  Clinical Data: Acute shortness of breath and wheezing  PORTABLE CHEST - 1 VIEW  Comparison: 06/11/2011  Findings: Normal heart size.  Mild increasing pulmonary vascularity which might represent early congestion.  No edema.  No blunting of costophrenic angles.   Infiltration or atelectasis in the right lung base again demonstrated.  IMPRESSION: Slightly increased pulmonary vascularity since the previous study may suggest developing congestion.  Heart size is normal. Atelectasis or infiltration in the right lung base is again demonstrated without change.  Original Report Authenticated By: Marlon Pel, M.D.   Dg Chest Portable 1 View  06/06/2011  *RADIOLOGY REPORT*  Clinical Data: Some breath.  PORTABLE CHEST - 1 VIEW  Comparison: None.  Findings: The chest is hyperexpanded.  Interstitial opacities predominantly in the bases are compatible with edema.  No consolidative process.  No pneumothorax or pleural effusion.  IMPRESSION:  1.  Findings compatible with mild interstitial edema. 2.  Pulmonary hyperexpansion compatible with emphysema.  Original Report Authenticated By: Bernadene Bell. Maricela Curet, M.D.   Medications: Scheduled Meds:   . ALPRAZolam  0.25 mg Oral BID  . aspirin EC  81 mg Oral Daily  . azithromycin  500 mg Intravenous Q24H  . bisoprolol-hydrochlorothiazide  1 tablet Oral Daily  . calcium-vitamin D  2 tablet Oral Daily  . cefTRIAXone (ROCEPHIN)  IV  1 g Intravenous Q24H  . cholecalciferol  1,000 Units Oral Daily  . enoxaparin  40 mg Subcutaneous Q24H  . insulin aspart  0-5 Units Subcutaneous QHS  . insulin aspart  0-9 Units Subcutaneous TID WC  . loratadine  10 mg Oral Daily  . methylPREDNISolone (SOLU-MEDROL) injection  60 mg Intravenous Q24H  . nicotine  14 mg Transdermal Daily  . omega-3 acid ethyl esters  1 g Oral Daily  . pantoprazole  40 mg Oral Q1200  . valACYclovir  500 mg Oral  Daily   Continuous Infusions:  PRN Meds:.acetaminophen, acetaminophen, albuterol, albuterol, albuterol, HYDROmorphone, ipratropium, ondansetron (ZOFRAN) IV, ondansetron, ondansetron  Assessment/Plan: Patient Active Hospital Problem List: Hypoxemia/COPD (chronic obstructive pulmonary disease) : on iv steroids. Improving. Will do a slow taper . Continue  with the antibiotics.  And bronchodilator therapy.    Tobacco abuse (06/06/2011)  tobacco cessation counseling given.    Hyperglycemia: steroid induced.  On SSI  Anxiety: on xanax.   Hypertension (06/12/2011)  controlled.   LOS: 1 day   Bernese Doffing 06/13/2011, 9:12 PM

## 2011-06-13 NOTE — Progress Notes (Signed)
CSW received referral from [facility] that PT is recommending SNF for rehab before returning home. CSW met with patient's husband, Chrissie Noa (cell#: [phone] home#: [phone]) who stated that family plans to take patient home and will provide 24 hour care between the family members. They are anticipating discharge home Saturday. RNCM made aware. CSW signing off.   Unice Bailey, LCSWA [phone]

## 2011-06-13 NOTE — Progress Notes (Signed)
UR completed 

## 2011-06-13 NOTE — Progress Notes (Addendum)
   CARE MANAGEMENT NOTE 06/13/2011  Patient:  Shannon Schultz, Shannon Schultz   Account Number:  [id number]  Date Initiated:  06/13/2011  Documentation initiated by:  Lorenda Ishihara  Subjective/Objective Assessment:   65 yo female admitted with hypoxemia, recently d/ced for same. PTA lived at home with spouse. PCP is Dr. Sharlot Gowda.     Action/Plan:   Per husband wishes to resume [facility] services and take patient home   Anticipated DC Date:  06/14/2011   Anticipated DC Plan:  HOME W HOME HEALTH SERVICES  In-house referral  Clinical Social Worker      DC Planning Services  CM consult      [facility] Choice  Resumption Of Svcs/PTA Provider   Choice offered to / List presented to:             Status of service:  Completed, signed off Medicare Important Message given?   (If response is "NO", the following Medicare IM given date fields will be blank) Date Medicare IM given:   Date Additional Medicare IM given:    Discharge Disposition:  HOME W HOME HEALTH SERVICES  Per UR Regulation:  Reviewed for med. necessity/level of care/duration of stay  Comments:  06-13-11 Lorenda Ishihara RN CM [phone] Per PT note, recommendations for SNF at d/c. Notified CSW, she spoke with husband who states he wishes to take patient home at d/c, can provide 24h care. Patient setup with [facility] on previous admission, notified Darl Pikes with [facility] of admission and potential d/c in am.

## 2011-06-13 NOTE — Progress Notes (Signed)
Physical Therapy Evaluation Patient Details Name: Shannon Schultz MRN: [id number] DOB: Mar 12, 1946 Today's Date: 06/13/2011 10:05-10:30, EV2  Problem List:  Patient Active Problem List  Diagnoses  . Pure hypercholesterolemia  . Type II or unspecified type diabetes mellitus without mention of complication, not stated as uncontrolled  . Essential hypertension, benign  . Tobacco use disorder  . Vitamin D deficiency  . Tobacco abuse  . HTN (hypertension)  . Diabetes mellitus  . Transaminitis  . Hypoxemia  . COPD (chronic obstructive pulmonary disease)  . Hypertension    Past Medical History:  Past Medical History  Diagnosis Date  . Allergy   . Elevated cholesterol   . Hypertension   . Genital HSV     gets on hip  . Diverticulosis   . 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 Vitamin D deficiency disease   . Osteopenia   . Migraine   . Elevated liver enzymes     fatty liver per ultrasound per pt  . Impaired glucose tolerance   . Colon polyp    Past Surgical History:  Past Surgical History  Procedure Date  . Mastectomy 1992    R breast for cancer  . Colonoscopy 2009, 08/2010    Dr. Loreta Ave  . Cholecystectomy 2003  . Hernia repair 2003    umbilical    PT Assessment/Plan/Recommendation PT Assessment Clinical Impression Statement: Pt is a 65 y/o WF recently discharged from [facility] with PNA.  Pt was home about 6 hrs before EMS had to be called and pt admitted to [facility].  Pt de-sats very quickly even with oxygen on 3-6L/min depending on activity.  Pt's O2 decreased to 83% with 10 feet of gait on 6L/min. Pt reports husband works during the day and she will be home alone.  Pt will most likely need SNF secondary to poor activity tolerance and desaturation. PT Recommendation/Assessment: Patient will need skilled PT in the acute care venue PT Problem List: Decreased activity tolerance;Decreased mobility;Decreased coordination;Cardiopulmonary status limiting activity Barriers to  Discharge: Decreased caregiver support Barriers to Discharge Comments: husband works and pt will need A secondary to desaturation with minimal activity. PT Therapy Diagnosis : Generalized weakness;Difficulty walking PT Plan PT Frequency: Min 3X/week PT Recommendation Follow Up Recommendations: Skilled nursing facility;24 hour supervision/assistance (unless family can provide 24/7) Equipment Recommended: Rolling walker with 5" wheels (possibly a RW- to be assessed while in facility) PT Goals  Acute Rehab PT Goals PT Goal Formulation: With patient Time For Goal Achievement: 2 weeks Pt will go Supine/Side to Sit: with modified independence PT Goal: Supine/Side to Sit - Progress:  (not addressed) Pt will go Sit to Supine/Side: with modified independence PT Goal: Sit to Supine/Side - Progress:  (not addressed) Pt will go Sit to Stand: with modified independence (with oxygen level >90%) PT Goal: Sit to Stand - Progress: Progressing toward goal Pt will Ambulate: 16 - 50 feet;with least restrictive assistive device;with supervision (with oxygen >90%) PT Goal: Ambulate - Progress: Progressing toward goal  PT Evaluation Precautions/Restrictions  Precautions Precautions: Fall Restrictions Weight Bearing Restrictions: No Prior Functioning  Home Living Lives With: Spouse Receives Help From: Other (Comment) (Husband works during the day) Type of Home: House Home Layout: One level Home Access: Level entry Bathroom Shower/Tub: Engineer, manufacturing systems: Handicapped height Home Adaptive Equipment: Straight cane Prior Function Level of Independence: Independent with basic ADLs;Independent with homemaking with ambulation;Independent with gait;Independent with transfers Driving: Yes Vocation: Retired Producer, television/film/video: Awake/alert Overall  Cognitive Status: Appears within functional limits for tasks assessed Sensation/Coordination   Extremity Assessment RUE  Assessment RUE Assessment: Within Functional Limits LUE Assessment LUE Assessment: Within Functional Limits RLE Assessment RLE Assessment: Within Functional Limits LLE Assessment LLE Assessment: Within Functional Limits Mobility (including Balance) Bed Mobility Bed Mobility: No (up in recliner upon arrival, O2 increased to 4L for mobility) Transfers Sit to Stand: 5: Supervision;From chair/3-in-1;With armrests Sit to Stand Details (indicate cue type and reason): oxygen set to 6L/min secondary 87 % at 4 L/min with sit to stand Stand to Sit: 4: Min assist Stand to Sit Details: min/guard Ambulation/Gait Ambulation/Gait Assistance: 4: Min assist Ambulation/Gait Assistance Details (indicate cue type and reason): min/guard, Pt's oxygen level began to drop fairly quickly to 83% on 6L/min via nasal canula and with pursed lip breathing education. Pt also reported lightheadedness. Took ~1.5 - 2 mins to get oxygen back to 90%.  Re-set to 3L/min and was at 90% when exited room. Please see vitals and pain documented by OT who was co-evaling with this PT. Ambulation Distance (Feet): 10 Feet Assistive device: Other (Comment) (IV pole, began to try with cane, but O2 dropped) Gait Pattern: Shuffle;Decreased step length - left;Decreased step length - right Gait velocity: slow    Exercise    End of Session PT - End of Session Equipment Utilized During Treatment: Gait belt Activity Tolerance: Patient limited by fatigue;Treatment limited secondary to medical complications (Comment) (decreased O2 sats) Patient left: in chair;with call bell in reach (with nurse present) Nurse Communication: Mobility status for transfers;Mobility status for ambulation General Behavior During Session: [facility] for tasks performed Cognition: [facility] for tasks performed  [facility] LUBECK 06/13/2011, 11:00 AM

## 2011-06-13 NOTE — Progress Notes (Signed)
Occupational Therapy Evaluation Patient Details Name: Shannon Schultz MRN: [id number] DOB: 07/19/1945 Today's Date: 06/13/2011 1005-1025 EV2 Problem List:  Patient Active Problem List  Diagnoses  . Pure hypercholesterolemia  . Type II or unspecified type diabetes mellitus without mention of complication, not stated as uncontrolled  . Essential hypertension, benign  . Tobacco use disorder  . Vitamin D deficiency  . Tobacco abuse  . HTN (hypertension)  . Diabetes mellitus  . Transaminitis  . Hypoxemia  . COPD (chronic obstructive pulmonary disease)  . Hypertension    Past Medical History:  Past Medical History  Diagnosis Date  . Allergy   . Elevated cholesterol   . Hypertension   . Genital HSV     gets on hip  . Diverticulosis   . 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 Vitamin D deficiency disease   . Osteopenia   . Migraine   . Elevated liver enzymes     fatty liver per ultrasound per pt  . Impaired glucose tolerance   . Colon polyp    Past Surgical History:  Past Surgical History  Procedure Date  . Mastectomy 1992    R breast for cancer  . Colonoscopy 2009, 08/2010    Dr. Loreta Ave  . Cholecystectomy 2003  . Hernia repair 2003    umbilical    OT Assessment/Plan/Recommendation OT Assessment Clinical Impression Statement: Pt presents w/ extremely poor activity tolerance, o2 saturation decreasing upon minimal exertion. Would benefit from skilled OT to maximize ADL Independence for d/c to next venue of care or safe d/c home w/ HHOT. Pt states she would be home alone all day. Feel [facility] would be safest d/c option. If pt does decide to return home, would need 24/7 A & HHOT OT Recommendation/Assessment: Patient will need skilled OT in the acute care venue OT Problem List: Decreased activity tolerance;Decreased safety awareness;Cardiopulmonary status limiting activity;Decreased knowledge of use of DME or AE Barriers to Discharge: Inaccessible home environment;Decreased caregiver  support OT Therapy Diagnosis : Generalized weakness OT Plan OT Frequency: Min 2X/week OT Treatment/Interventions: Self-care/ADL training;Energy conservation;DME and/or AE instruction;Therapeutic activities;Patient/family education OT Recommendation Follow Up Recommendations: Skilled nursing facility;Other (comment) (If pt does decide to return home, recommend HHOT) Equipment Recommended: 3 in 1 bedside comode Individuals Consulted Consulted and Agree with Results and Recommendations: Patient OT Goals Acute Rehab OT Goals OT Goal Formulation: With patient Time For Goal Achievement: 2 weeks ADL Goals Pt Will Perform Lower Body Bathing: with set-up;Sit to stand from chair;Sit to stand from bed;Other (comment) (with RBs prn) ADL Goal: Lower Body Bathing - Progress: Progressing toward goals Pt Will Perform Lower Body Dressing: with set-up;Sit to stand from bed;Sit to stand from chair;with adaptive equipment;Other (comment) (with RBs prn) ADL Goal: Lower Body Dressing - Progress: Progressing toward goals Pt Will Transfer to Toilet: with modified independence;3-in-1;Raised toilet seat without arms;Ambulation ADL Goal: Toilet Transfer - Progress: Progressing toward goals Pt Will Perform Toileting - Clothing Manipulation: with modified independence;Other (comment) (sit<>stand from RTS or 3:1) ADL Goal: Toileting - Clothing Manipulation - Progress: Progressing toward goals Pt Will Perform Toileting - Hygiene: with modified independence;Sit to stand from 3-in-1/toilet ADL Goal: Toileting - Hygiene - Progress: Progressing toward goals Pt Will Perform Tub/Shower Transfer: with supervision;Grab bars;Shower seat with back ADL Goal: Web designer - Progress: Not met Additional ADL Goal #1: Pt will verbalize & demo 5 energy conservation strategies for successful ADL & IADL completion ADL Goal: Additional Goal #1 - Progress: Not met  OT  Evaluation Precautions/Restrictions   Precautions Precautions: Fall Restrictions Weight Bearing Restrictions: No Prior Functioning Home Living Lives With: Spouse Receives Help From: Other (Comment) (Husband works during the day) Type of Home: House Home Layout: One level Home Access: Level entry Bathroom Shower/Tub: Engineer, manufacturing systems: Handicapped height Home Adaptive Equipment: Straight cane Prior Function Level of Independence: Independent with basic ADLs;Independent with homemaking with ambulation;Independent with gait;Independent with transfers Driving: Yes Vocation: Retired ADL ADL Grooming: Simulated;Set up Where Assessed - Grooming: Sitting, chair;Unsupported Upper Body Bathing: Simulated;Set up Where Assessed - Upper Body Bathing: Sitting, chair;Unsupported Lower Body Bathing: Moderate assistance Where Assessed - Lower Body Bathing: Sit to stand from chair Upper Body Dressing: Performed;Set up Where Assessed - Upper Body Dressing: Sitting, chair;Unsupported Lower Body Dressing: Performed;Moderate assistance Where Assessed - Lower Body Dressing: Sitting, chair;Unsupported;Sit to stand from chair Toilet Transfer: Not assessed Toilet Transfer Method: Not assessed Toileting - Clothing Manipulation: Simulated;Moderate assistance Where Assessed - Toileting Clothing Manipulation: Standing Toileting - Hygiene: Simulated;Moderate assistance Where Assessed - Toileting Hygiene: Standing Tub/Shower Transfer: Not assessed Tub/Shower Transfer Method: Not assessed ADL Comments: Pt fatigues very quickly, O2 sats drop upon minimal exertion. Requires constant rest breaks upon any functional tasks Vision/Perception  Vision - History Baseline Vision: Wears glasses only for reading Visual History: Cataracts Patient Visual Report: No change from baseline Vision - Assessment Vision Assessment: Vision not tested Cognition Cognition Arousal/Alertness: Awake/alert Overall Cognitive Status: Appears within  functional limits for tasks assessed Sensation/Coordination   Extremity Assessment RUE Assessment RUE Assessment: Within Functional Limits LUE Assessment LUE Assessment: Within Functional Limits Mobility  Bed Mobility Bed Mobility: No (up in recliner upon arrival, O2 increased to 4L for mobility) Transfers Transfers: Yes Sit to Stand: 5: Supervision;From chair/3-in-1;With armrests Sit to Stand Details (indicate cue type and reason): oxygen set to 6L/min secondary 87 % at 4 L/min with sit to stand Stand to Sit: 4: Min assist Stand to Sit Details: min/guard Exercises   End of Session OT - End of Session Equipment Utilized During Treatment: Gait belt Activity Tolerance: Patient limited by fatigue Patient left: in chair;with call bell in reach Nurse Communication: Mobility status for transfers General Behavior During Session: [facility] for tasks performed Cognition: [facility] for tasks performed   Shan Valdes A [phone] 06/13/2011, 10:51 AM

## 2011-06-14 LAB — GLUCOSE, CAPILLARY
Glucose-Capillary: 108 mg/dL — ABNORMAL HIGH (ref 70–99)
Glucose-Capillary: 210 mg/dL — ABNORMAL HIGH (ref 70–99)
Glucose-Capillary: 217 mg/dL — ABNORMAL HIGH (ref 70–99)
Glucose-Capillary: 189 mg/dL — ABNORMAL HIGH (ref 70–99)
Glucose-Capillary: 237 mg/dL — ABNORMAL HIGH (ref 70–99)

## 2011-06-14 LAB — CBC
HCT: 43.7 % (ref 36.0–46.0)
Hemoglobin: 14.7 g/dL (ref 12.0–15.0)
MCH: 30.5 pg (ref 26.0–34.0)
MCHC: 33.6 g/dL (ref 30.0–36.0)
MCV: 90.7 fL (ref 78.0–100.0)
Platelets: 212 10*3/uL (ref 150–400)
RBC: 4.82 MIL/uL (ref 3.87–5.11)
RDW: 13.4 % (ref 11.5–15.5)
WBC: 8.2 10*3/uL (ref 4.0–10.5)

## 2011-06-14 LAB — BASIC METABOLIC PANEL
BUN: 22 mg/dL (ref 6–23)
CO2: 38 mEq/L — ABNORMAL HIGH (ref 19–32)
Calcium: 10.4 mg/dL (ref 8.4–10.5)
Chloride: 96 mEq/L (ref 96–112)
Creatinine, Ser: 0.75 mg/dL (ref 0.50–1.10)
GFR calc Af Amer: >90 mL/min (ref >90)
GFR calc non Af Amer: 87 mL/min — ABNORMAL LOW (ref >90)
Glucose, Bld: 111 mg/dL — ABNORMAL HIGH (ref 70–99)
Potassium: 4.9 mEq/L (ref 3.5–5.1)
Sodium: 140 mEq/L (ref 135–145)

## 2011-06-14 MED ORDER — PREDNISONE 50 MG PO TABS
60.0000 mg | ORAL_TABLET | Freq: Every day | ORAL | Status: DC
  Administered 2011-06-15: 60 mg via ORAL
  Filled 2011-06-14 (×2): qty 1

## 2011-06-14 NOTE — Progress Notes (Signed)
Subjective: Pt comfortable , and improving, Stated that her nicotine patch and xanax helped a lot.  Objective: Weight change:  No intake or output data in the 24 hours ending 06/14/11 1802  She is alert afebrile and slightly anxious  CVS: heart sounds normal  Lungs: no wheezing heard, good air entry bilateral.  Abdomen: benign  Extremities: no edema.  Lab Results: Results for orders placed during the hospital encounter of 06/12/11 (from the past 24 hour(s))  GLUCOSE, CAPILLARY     Status: Abnormal   Collection Time   06/13/11  9:27 PM      Component Value Range   Glucose-Capillary 187 (*) 70 - 99 (mg/dL)   Comment 1 Notify RN    BASIC METABOLIC PANEL     Status: Abnormal   Collection Time   06/14/11  5:30 AM      Component Value Range   Sodium 140  135 - 145 (mEq/L)   Potassium 4.9  3.5 - 5.1 (mEq/L)   Chloride 96  96 - 112 (mEq/L)   CO2 38 (*) 19 - 32 (mEq/L)   Glucose, Bld 111 (*) 70 - 99 (mg/dL)   BUN 22  6 - 23 (mg/dL)   Creatinine, Ser 4.54  0.50 - 1.10 (mg/dL)   Calcium 09.8  8.4 - 10.5 (mg/dL)   GFR calc non Af Amer 87 (*) >90 (mL/min)   GFR calc Af Amer >90  >90 (mL/min)  CBC     Status: Normal   Collection Time   06/14/11  5:30 AM      Component Value Range   WBC 8.2  4.0 - 10.5 (K/uL)   RBC 4.82  3.87 - 5.11 (MIL/uL)   Hemoglobin 14.7  12.0 - 15.0 (g/dL)   HCT 11.9  14.7 - 82.9 (%)   MCV 90.7  78.0 - 100.0 (fL)   MCH 30.5  26.0 - 34.0 (pg)   MCHC 33.6  30.0 - 36.0 (g/dL)   RDW 56.2  13.0 - 86.5 (%)   Platelets 212  150 - 400 (K/uL)  GLUCOSE, CAPILLARY     Status: Abnormal   Collection Time   06/14/11  8:00 AM      Component Value Range   Glucose-Capillary 108 (*) 70 - 99 (mg/dL)   Comment 1 Notify RN     Comment 2 Documented in Chart    GLUCOSE, CAPILLARY     Status: Abnormal   Collection Time   06/14/11 11:00 AM      Component Value Range   Glucose-Capillary 210 (*) 70 - 99 (mg/dL)  GLUCOSE, CAPILLARY     Status: Abnormal   Collection Time   06/14/11  4:23 PM      Component Value Range   Glucose-Capillary 217 (*) 70 - 99 (mg/dL)   Comment 1 Notify RN    GLUCOSE, CAPILLARY     Status: Abnormal   Collection Time   06/14/11  4:45 PM      Component Value Range   Glucose-Capillary 189 (*) 70 - 99 (mg/dL)     Micro Results: Recent Results (from the past 240 hour(s))  CULTURE, BLOOD (ROUTINE X 2)     Status: Normal   Collection Time   06/06/11 11:53 AM      Component Value Range Status Comment   Specimen Description BLOOD LEFT ARM   Final    Special Requests BOTTLES DRAWN AEROBIC AND ANAEROBIC 10CC   Final    Setup Time [id number]   Final  Culture NO GROWTH 5 DAYS   Final    Report Status 06/12/2011 FINAL   Final   CULTURE, BLOOD (ROUTINE X 2)     Status: Normal   Collection Time   06/06/11 12:04 PM      Component Value Range Status Comment   Specimen Description BLOOD LEFT ARM   Final    Special Requests BOTTLES DRAWN AEROBIC AND ANAEROBIC 10CC   Final    Setup Time [id number]   Final    Culture NO GROWTH 5 DAYS   Final    Report Status 06/12/2011 FINAL   Final   CULTURE, SPUTUM-ASSESSMENT     Status: Normal   Collection Time   06/07/11  7:30 AM      Component Value Range Status Comment   Specimen Description SPUTUM   Final    Special Requests NONE   Final    Sputum evaluation     Final    Value: MICROSCOPIC FINDINGS SUGGEST THAT THIS SPECIMEN IS NOT REPRESENTATIVE OF LOWER RESPIRATORY SECRETIONS. PLEASE RECOLLECT.     CALLED TO Filomena Jungling 06/07/11  1215 EHOWARD   Report Status 06/07/2011 FINAL   Final     Studies/Results: Dg Chest 2 View  06/11/2011  *RADIOLOGY REPORT*  Clinical Data: Pneumonia.Hypertension.  Diabetes.  CHEST - 2 VIEW  Comparison: 06/07/2011.  Findings: There is persistent patchy airspace disease in the right lower lobe.  Emphysema is present.  Compared to prior there is little interval change.  Flattening of hemidiaphragms is present. Blunting of the costophrenic angles is probably associated  with emphysema rather than pleural effusions.  Cardiopericardial silhouette and mediastinal contours are unchanged.  Right axillary dissection clips. Radiographic follow-up is recommended to ensure clearing and exclude an underlying lesion.  Radiographic clearing is usually observed at 4-6 weeks.  IMPRESSION: Patchy right lower lobe airspace density is unchanged compared to prior.  Underlying emphysema.  Original Report Authenticated By: Andreas Newport, M.D.   X-ray Chest Pa And Lateral   06/07/2011  *RADIOLOGY REPORT*  Clinical Data: Pneumonia.  Cough and congestion.  Short of breath.  CHEST - 2 VIEW  Comparison: 06/06/2011.  Findings: Right axillary dissection clips are present. Density is present in the right lower lobe on the lateral view, which may represent atelectasis or airspace disease.  Hyperinflation is present suggesting emphysema.  No pleural effusion is identified. Cardiopericardial silhouette appears within normal limits.  Aortic arch atherosclerosis.  Trachea midline. Compared to prior exam, pulmonary aeration is improved.  IMPRESSION: Right lower lobe airspace disease or atelectasis. Pneumonia cannot be excluded.  Emphysema.  Original Report Authenticated By: Andreas Newport, M.D.   US Abdomen Complete  06/06/2011  *RADIOLOGY REPORT*  Clinical Data:  Elevated liver function tests.  Surgical history includes cholecystectomy.  History of breast cancer.  Prior history of hepatomegaly and splenomegaly.  COMPLETE ABDOMINAL ULTRASOUND 06/06/2011:  Comparison:  CT abdomen and pelvis 06/28/2009 [facility].  Findings:  Gallbladder:  Surgically absent.  Common bile duct:  Normal in caliber post cholecystectomy with maximum diameter approximating 7 mm.  Liver:  Enlarged with diffusely increased and coarsened echotexture without focal parenchymal abnormality.  Patent portal vein with hepatopetal flow.  IVC:  Patent.  Pancreas:  Although the pancreas is difficult to visualize in its entirety, no  focal pancreatic abnormality is identified.  Spleen:  Enlarged, measuring approximately 13.3 x 16.0 x 5.1 cm, yielding a volume of approximately 563 ml.  No focal splenic parenchymal abnormality.  Right Kidney:  No hydronephrosis.  Well-preserved cortex.  No shadowing calculi.  Normal size and parenchymal echotexture without focal abnormalities.  Approximately 13.4 cm in length.  Left Kidney:  No hydronephrosis.  Well-preserved cortex.  No shadowing calculi.  Normal size and parenchymal echotexture without focal abnormalities.  Approximately 14.3 cm in length.  Abdominal aorta:  Normal in caliber throughout its visualized course in the abdomen with mild to moderate atherosclerosis.  IMPRESSION:  1.  Hepatomegaly with diffuse hepatic steatosis and/or hepatocellular disease.  No focal hepatic parenchymal abnormality. 2.  Splenomegaly without focal splenic parenchymal abnormality. 3.  Abdominal aortic atherosclerosis without aneurysm.  Original Report Authenticated By: Arnell Sieving, M.D.   Dg Chest Portable 1 View  06/12/2011  *RADIOLOGY REPORT*  Clinical Data: Acute shortness of breath and wheezing  PORTABLE CHEST - 1 VIEW  Comparison: 06/11/2011  Findings: Normal heart size.  Mild increasing pulmonary vascularity which might represent early congestion.  No edema.  No blunting of costophrenic angles.  Infiltration or atelectasis in the right lung base again demonstrated.  IMPRESSION: Slightly increased pulmonary vascularity since the previous study may suggest developing congestion.  Heart size is normal. Atelectasis or infiltration in the right lung base is again demonstrated without change.  Original Report Authenticated By: Marlon Pel, M.D.   Dg Chest Portable 1 View  06/06/2011  *RADIOLOGY REPORT*  Clinical Data: Some breath.  PORTABLE CHEST - 1 VIEW  Comparison: None.  Findings: The chest is hyperexpanded.  Interstitial opacities predominantly in the bases are compatible with edema.  No  consolidative process.  No pneumothorax or pleural effusion.  IMPRESSION:  1.  Findings compatible with mild interstitial edema. 2.  Pulmonary hyperexpansion compatible with emphysema.  Original Report Authenticated By: Bernadene Bell. Maricela Curet, M.D.   Medications: Scheduled Meds:   . ALPRAZolam  0.25 mg Oral BID  . aspirin EC  81 mg Oral Daily  . azithromycin  500 mg Intravenous Q24H  . bisoprolol-hydrochlorothiazide  1 tablet Oral Daily  . calcium-vitamin D  2 tablet Oral Daily  . cefTRIAXone (ROCEPHIN)  IV  1 g Intravenous Q24H  . cholecalciferol  1,000 Units Oral Daily  . enoxaparin  40 mg Subcutaneous Q24H  . insulin aspart  0-5 Units Subcutaneous QHS  . insulin aspart  0-9 Units Subcutaneous TID WC  . loratadine  10 mg Oral Daily  . nicotine  14 mg Transdermal Daily  . omega-3 acid ethyl esters  1 g Oral Daily  . pantoprazole  40 mg Oral Q1200  . predniSONE  60 mg Oral QAC breakfast  . valACYclovir  500 mg Oral Daily  . DISCONTD: methylPREDNISolone (SOLU-MEDROL) injection  60 mg Intravenous Q24H   Continuous Infusions:  PRN Meds:.acetaminophen, acetaminophen, albuterol, albuterol, albuterol, HYDROmorphone, ipratropium, ondansetron (ZOFRAN) IV, ondansetron, ondansetron  Assessment/Plan: COPD (chronic obstructive pulmonary disease) : on iv steroids. Improving. Will do a slow taper . Continue with the antibiotics. And bronchodilator therapy.   Tobacco abuse (06/06/2011) tobacco cessation counseling given.  On nicotine patch.   Hyperglycemia: steroid induced. On SSI  Anxiety: on xanax.   Hypertension (06/12/2011) controlled.     LOS: 2 days   Cydney Alvarenga 06/14/2011, 6:02 PM

## 2011-06-15 LAB — GLUCOSE, CAPILLARY
Glucose-Capillary: 187 mg/dL — ABNORMAL HIGH (ref 70–99)
Glucose-Capillary: 179 mg/dL — ABNORMAL HIGH (ref 70–99)
Glucose-Capillary: 298 mg/dL — ABNORMAL HIGH (ref 70–99)

## 2011-06-15 MED ORDER — AZITHROMYCIN 500 MG PO TABS
500.0000 mg | ORAL_TABLET | ORAL | Status: DC
  Filled 2011-06-15: qty 1

## 2011-06-15 MED ORDER — NICOTINE 14 MG/24HR TD PT24: 1.0000 | MEDICATED_PATCH | Freq: Every day | TRANSDERMAL | Status: DC

## 2011-06-15 MED ORDER — IPRATROPIUM BROMIDE 0.02 % IN SOLN: 0.5000 mg | RESPIRATORY_TRACT | Status: DC | PRN

## 2011-06-15 MED ORDER — ALPRAZOLAM 0.25 MG PO TABS: 0.2500 mg | ORAL_TABLET | Freq: Two times a day (BID) | ORAL | Status: AC

## 2011-06-15 MED ORDER — PREDNISONE 20 MG PO TABS: ORAL_TABLET | ORAL | Status: DC

## 2011-06-15 NOTE — Progress Notes (Signed)
PHARMACIST - PHYSICIAN COMMUNICATION CONCERNING: Antibiotic IV to Oral Route Change Policy  RECOMMENDATION: This patient is receiving azithromycin by the intravenous route.  Based on criteria approved by the Pharmacy and Therapeutics Committee, the antibiotic(s) is/are being converted to the equivalent oral dose form(s).   DESCRIPTION: These criteria include:  Patient being treated for a respiratory tract infection, urinary tract infection, or cellulitis  The patient is not neutropenic and does not exhibit a GI malabsorption state  The patient is eating (either orally or via tube) and/or has been taking other orally administered medications for a least 24 hours  The patient is improving clinically and has a Tmax < 100.5  If you have questions about this conversion, please contact the Pharmacy Department  []   [phone] )  [facility] []   [phone] )  [facility]  []   [phone] )  [facility] [x]   [phone] )  [facility]

## 2011-06-15 NOTE — Discharge Summary (Signed)
DISCHARGE SUMMARY  ZONYA GUDGER  MR#: [id number]  DOB:Jan 02, 1946  Date of Admission: 06/12/2011 Date of Discharge: 06/15/2011  Attending Physician:Joachim Carton  Patient's YQM:VHQIONG,EXBM Leonette Most, MD, MD  Consults: None  Discharge Diagnoses: Present on Admission:  .Hypoxemia .Tobacco abuse Copd exacerbation    Current Discharge Medication List    START taking these medications   Details  ALPRAZolam (XANAX) 0.25 MG tablet Take 1 tablet (0.25 mg total) by mouth 2 (two) times daily. Qty: 30 tablet, Refills: 0    ipratropium (ATROVENT) 0.02 % nebulizer solution Take 2.5 mLs (0.5 mg total) by nebulization every 4 (four) hours as needed. Qty: 75 mL, Refills: 0    nicotine (NICODERM CQ - DOSED IN MG/24 HOURS) 14 mg/24hr patch Place 1 patch onto the skin daily. Qty: 28 patch, Refills: 2      CONTINUE these medications which have CHANGED   Details  predniSONE (DELTASONE) 20 MG tablet Prednisone 60mg  for 2 days followed by  Prednisone 40mg  for 3 days followed by Prednisone 30mg  for 3 days followed by Prednisone 20mg  for 3 days followed by  Prednisone 10mg  for 3 days. Qty: 25 tablet, Refills: 0      CONTINUE these medications which have NOT CHANGED   Details  albuterol (PROVENTIL HFA;VENTOLIN HFA) 108 (90 BASE) MCG/ACT inhaler Inhale 2 puffs into the lungs every 6 (six) hours as needed for wheezing. Qty: 18 g, Refills: 0    amoxicillin-clavulanate (AUGMENTIN) 875-125 MG per tablet Take 1 tablet by mouth every 12 (twelve) hours. Qty: 10 tablet, Refills: 0    aspirin EC 81 MG tablet Take 81 mg by mouth daily.      bisoprolol-hydrochlorothiazide (ZIAC) 10-6.25 MG per tablet Take 1 tablet by mouth daily.      Calcium Carbonate-Vitamin D (CALCIUM 600+D) 600-400 MG-UNIT per tablet Take 2 tablets by mouth daily.      cetirizine (ZYRTEC) 10 MG tablet Take 10 mg by mouth daily.      cholecalciferol (VITAMIN D) 1000 UNITS tablet Take 1,000 Units by mouth daily.        Multiple Vitamins-Minerals (CENTRUM SILVER PO) Take 1 tablet by mouth.      Omega-3 Fatty Acids (FISH OIL) 1200 MG CAPS Take 2 capsules by mouth daily.     ondansetron (ZOFRAN) 4 MG tablet Take 1 tablet (4 mg total) by mouth every 6 (six) hours as needed for nausea. Qty: 20 tablet, Refills: 0    pantoprazole (PROTONIX) 40 MG tablet Take 1 tablet (40 mg total) by mouth daily at 12 noon. Qty: 15 tablet, Refills: 0    Probiotic Product (HEALTHY COLON PO) Take 1 tablet by mouth daily.      simvastatin (ZOCOR) 20 MG tablet Take 20 mg by mouth at bedtime.      valACYclovir (VALTREX) 1000 MG tablet Take 500 mg by mouth daily.           Hospital Course: Present on Admission:  COPD (chronic obstructive pulmonary disease) :65 year old lady with h/o copd who was recently discharged from [facility] readmitted for worsening sob, she was started on iv steroid and her sob improved. She is being discharged on a slow prednisone taper and augmentin. She is recommended to get pulmonary function tests as outpatient. .she is also recommended to continue with  bronchodilator therapy.   Tobacco abuse (06/06/2011) tobacco cessation counseling given. Pt is discharged  On nicotine patch.   Hyperglycemia: steroid induced. On SSI in the hospital. Pt was reassured that her blood  sugars will be normalised with steroid taper. Anxiety: on xanax. Continue the same.  Hypertension (06/12/2011) controlled. Continue with home medications.    Day of Discharge BP 133/73  Pulse 71  Temp(Src) 98.7 F (37.1 C) (Oral)  Resp 18  Ht 5\' 4"  (1.626 m)  Wt 104.327 kg (230 lb)  BMI 39.48 kg/m2  SpO2 90%  Physical Exam: She is alert afebrile and comfortable. CVS: heart sounds normal  Lungs: no wheezing heard, good air entry bilateral.  Abdomen: benign  Extremities: no edema.   Results for orders placed during the hospital encounter of 06/12/11 (from the past 24 hour(s))  GLUCOSE, CAPILLARY     Status:  Abnormal   Collection Time   06/14/11  4:23 PM      Component Value Range   Glucose-Capillary 217 (*) 70 - 99 (mg/dL)   Comment 1 Notify RN    GLUCOSE, CAPILLARY     Status: Abnormal   Collection Time   06/14/11  4:45 PM      Component Value Range   Glucose-Capillary 189 (*) 70 - 99 (mg/dL)  GLUCOSE, CAPILLARY     Status: Abnormal   Collection Time   06/14/11  9:16 PM      Component Value Range   Glucose-Capillary 237 (*) 70 - 99 (mg/dL)   Comment 1 Notify RN     Comment 2 Documented in Chart    GLUCOSE, CAPILLARY     Status: Abnormal   Collection Time   06/15/11  8:41 AM      Component Value Range   Glucose-Capillary 187 (*) 70 - 99 (mg/dL)  GLUCOSE, CAPILLARY     Status: Abnormal   Collection Time   06/15/11 11:40 AM      Component Value Range   Glucose-Capillary 179 (*) 70 - 99 (mg/dL)    Disposition: Discharge home with Home oxygen   Follow-up Appts: Discharge Orders    Future Appointments: Provider: Department: Dept Phone: Center:   11/25/2011 8:30 AM [facility] [phone] [facility]   11/27/2011 11:45 AM Lavonda Jumbo, MD [facility] [phone] [facility]     Future Orders Please Complete By Expires   Diet - low sodium heart healthy      Increase activity slowly      Discharge instructions      Comments:   Discharge home and follow up with PCP in one week.  Need outpatient pulmonary function tests Outpatient referral for pulmonologist.        Signed: Jakeim Sedore 06/15/2011, 3:22 PM

## 2011-06-16 ENCOUNTER — Telehealth: Payer: Self-pay | Admitting: Family Medicine

## 2011-06-16 NOTE — Telephone Encounter (Signed)
Advise pt/husband that hospital records and discharge summary was reviewed.  There is no rush for referral to the pulmonary doctor, as we can stabilize her here, she just needs to have PFT's done at some point.  We can authorize this referral at her appointment on the 26th

## 2011-06-16 NOTE — Telephone Encounter (Signed)
This one is for you 

## 2011-06-16 NOTE — Telephone Encounter (Signed)
Reassured patient regarding elevated sugars being due to the prednisone, and should be temporary.

## 2011-06-16 NOTE — Telephone Encounter (Signed)
Husband called pt is home from the hospital,  He would like you to call him [phone] , also wants a referral to a lung doctor...Marland KitchenMarland KitchenRamaswammie or [place].  Pt has a follow up appt with [place] on 06/25/11

## 2011-06-16 NOTE — Telephone Encounter (Signed)
Spoke with patient and she was fine with the pulmonary referral being done at her next app 06/25/11. She was really concerned about her blood sugars and said she is freaking out a little bit. Her bs this am fasting was 104, she took it again after taking her prednisone at 10:00am and it was 271. Is there a reason for her to be concerned? Please advise. Thanks.

## 2011-06-17 ENCOUNTER — Inpatient Hospital Stay: Payer: BC Managed Care – PPO | Admitting: Family Medicine

## 2011-06-19 ENCOUNTER — Telehealth: Payer: Self-pay | Admitting: *Deleted

## 2011-06-19 NOTE — Telephone Encounter (Signed)
Faxed rx

## 2011-06-19 NOTE — Telephone Encounter (Signed)
Written please fax

## 2011-06-19 NOTE — Telephone Encounter (Signed)
Toniann Fail from [facility] called and asked if you could write an rx for nebulizer for patient and they will bring to her home for her use. Fax rx to [phone]. Thanks.

## 2011-06-25 ENCOUNTER — Encounter: Payer: Self-pay | Admitting: Family Medicine

## 2011-06-25 ENCOUNTER — Ambulatory Visit (INDEPENDENT_AMBULATORY_CARE_PROVIDER_SITE_OTHER): Payer: BC Managed Care – PPO | Admitting: Family Medicine

## 2011-06-25 DIAGNOSIS — I10 Essential (primary) hypertension: Secondary | ICD-10-CM

## 2011-06-25 DIAGNOSIS — J449 Chronic obstructive pulmonary disease, unspecified: Secondary | ICD-10-CM

## 2011-06-25 MED ORDER — NICOTINE 7 MG/24HR TD PT24: 1.0000 | MEDICATED_PATCH | TRANSDERMAL | Status: AC

## 2011-06-25 NOTE — Patient Instructions (Signed)
Use the portable oxygen when you're up moving around.

## 2011-06-25 NOTE — Progress Notes (Signed)
  Subjective:    Patient ID: Shannon Schultz, female    DOB: [birth year], 65 y.o.   MRN: [id number]  HPI She is here for a hospital followup visit. She was admitted on December 7 and discharged on the 13th however was readmitted due to hypoxia. The partial medical record was reviewed. She was sent home on steroids. Of note is the fact that her blood sugars on the steroids have been elevated. She is now on the use of prednisone and will be tapering off it entirely in approximately 4 days. She continues on oxygen and seems to think she is doing well off of it.  Review of Systems     Objective:   Physical Exam Alert and in no distress. Cardiac exam shows regular rhythm without murmurs or gallops. Lungs clear to auscultation. I checked her oxygen after walking around the office twice and she desaturated to 88. CBG today is 174      Assessment & Plan:   1. COPD (chronic obstructive pulmonary disease)   2. Essential hypertension, benign    elevated blood sugar do to prednisone. I will order her portable oxygen unit with her encouraged her to use the oxygen when she is physically active. She is also to use her nebulizer as needed for breathing rather than on a regular basis. Recheck here in approximately 10 days to reevaluate her breathing status as well as blood sugar.

## 2011-06-27 ENCOUNTER — Telehealth: Payer: Self-pay | Admitting: Internal Medicine

## 2011-06-27 NOTE — Telephone Encounter (Signed)
CALLED AND OK

## 2011-07-04 ENCOUNTER — Encounter: Payer: Self-pay | Admitting: Family Medicine

## 2011-07-04 ENCOUNTER — Ambulatory Visit (INDEPENDENT_AMBULATORY_CARE_PROVIDER_SITE_OTHER): Payer: BC Managed Care – PPO | Admitting: Family Medicine

## 2011-07-04 DIAGNOSIS — E119 Type 2 diabetes mellitus without complications: Secondary | ICD-10-CM

## 2011-07-04 DIAGNOSIS — R0902 Hypoxemia: Secondary | ICD-10-CM

## 2011-07-04 DIAGNOSIS — I10 Essential (primary) hypertension: Secondary | ICD-10-CM

## 2011-07-04 DIAGNOSIS — J449 Chronic obstructive pulmonary disease, unspecified: Secondary | ICD-10-CM

## 2011-07-04 NOTE — Progress Notes (Signed)
  Subjective:    Patient ID: Shannon Schultz, female    DOB: March 21, 1946, 66 y.o.   MRN: [id number]  HPI She is here for recheck. She recently got her portable oxygen unit. She also has a pulse ox that she does use periodically. She also intermittently checks her blood sugars. She  finished prednisone about a week ago. She does not complaining of shortness of breath or DOE.   Review of Systems     Objective:   Physical Exam Alert and in no distress. Pulse ox on room air is 95 however with walking she desaturated to 85.       Assessment & Plan:   1. COPD (chronic obstructive pulmonary disease)   2. Diabetes mellitus   3. HTN (hypertension)   4. Hypoxemia    encourage her to use the oxygen when she is physically active and at night. I discussed diabetes care with her in regard to diet and exercise as well as the effect of prednisone her blood sugars. She will continue to work on her diet and exercise as much as possible. Discussed smaller increments of exercise rather than one half hour at one time. Recheck here in approximately 4 months.

## 2011-07-04 NOTE — Patient Instructions (Addendum)
Use the oxygen at night and during the day with physical activities. Continue to check your blood sugars either before a meal or 2 hours after a meal and we will see you back here in 4 months unless your sugars start to really go high

## 2011-07-21 ENCOUNTER — Other Ambulatory Visit (HOSPITAL_COMMUNITY): Payer: Self-pay | Admitting: Internal Medicine

## 2011-08-08 ENCOUNTER — Encounter: Payer: Self-pay | Admitting: Internal Medicine

## 2011-08-14 ENCOUNTER — Telehealth: Payer: Self-pay | Admitting: *Deleted

## 2011-08-14 ENCOUNTER — Ambulatory Visit (INDEPENDENT_AMBULATORY_CARE_PROVIDER_SITE_OTHER): Payer: BC Managed Care – PPO | Admitting: Family Medicine

## 2011-08-14 ENCOUNTER — Other Ambulatory Visit: Payer: Self-pay | Admitting: *Deleted

## 2011-08-14 ENCOUNTER — Encounter: Payer: Self-pay | Admitting: Family Medicine

## 2011-08-14 DIAGNOSIS — E119 Type 2 diabetes mellitus without complications: Secondary | ICD-10-CM

## 2011-08-14 DIAGNOSIS — I10 Essential (primary) hypertension: Secondary | ICD-10-CM

## 2011-08-14 DIAGNOSIS — R011 Cardiac murmur, unspecified: Secondary | ICD-10-CM

## 2011-08-14 DIAGNOSIS — E7800 Pure hypercholesterolemia, unspecified: Secondary | ICD-10-CM

## 2011-08-14 DIAGNOSIS — J449 Chronic obstructive pulmonary disease, unspecified: Secondary | ICD-10-CM

## 2011-08-14 DIAGNOSIS — R7989 Other specified abnormal findings of blood chemistry: Secondary | ICD-10-CM

## 2011-08-14 NOTE — Patient Instructions (Signed)
We will be referring you to lung doctors for pulmonary function testing and evaluation.  We are also sending you for an echocardiogram to evaluate your heart, and murmur that was heard.  Continue to follow a low sodium diet  2 Gram Low Sodium Diet A 2 gram sodium diet restricts the amount of sodium in the diet to no more than 2 g or 2000 mg daily. Limiting the amount of sodium is often used to help lower blood pressure. It is important if you have heart, liver, or kidney problems. Many foods contain sodium for flavor and sometimes as a preservative. When the amount of sodium in a diet needs to be low, it is important to know what to look for when choosing foods and drinks. The following includes some information and guidelines to help make it easier for you to adapt to a low sodium diet. QUICK TIPS  Do not add salt to food.   Avoid convenience items and fast food.   Choose unsalted snack foods.   Buy lower sodium products, often labeled as "lower sodium" or "no salt added."   Check food labels to learn how much sodium is in 1 serving.   When eating at a restaurant, ask that your food be prepared with less salt or none, if possible.  READING FOOD LABELS FOR SODIUM INFORMATION The nutrition facts label is a good place to find how much sodium is in foods. Look for products with no more than 500 to 600 mg of sodium per meal and no more than 150 mg per serving. Remember that 2 g = 2000 mg. The food label may also list foods as:  Sodium-free: Less than 5 mg in a serving.   Very low sodium: 35 mg or less in a serving.   Low-sodium: 140 mg or less in a serving.   Light in sodium: 50% less sodium in a serving. For example, if a food that usually has 300 mg of sodium is changed to become light in sodium, it will have 150 mg of sodium.   Reduced sodium: 25% less sodium in a serving. For example, if a food that usually has 400 mg of sodium is changed to reduced sodium, it will have 300 mg of  sodium.  CHOOSING FOODS Grains  Avoid: Salted crackers and snack items. Some cereals, including instant hot cereals. Bread stuffing and biscuit mixes. Seasoned rice or pasta mixes.   Choose: Unsalted snack items. Low-sodium cereals, oats, puffed wheat and rice, shredded wheat. English muffins and bread. Pasta.  Meats  Avoid: Salted, canned, smoked, spiced, pickled meats, including fish and poultry. Bacon, ham, sausage, cold cuts, hot dogs, anchovies.   Choose: Low-sodium canned tuna and salmon. Fresh or frozen meat, poultry, and fish.  Dairy  Avoid: Processed cheese and spreads. Cottage cheese. Buttermilk and condensed milk. Regular cheese.   Choose: Milk. Low-sodium cottage cheese. Yogurt. Sour cream. Low-sodium cheese.  Fruits and Vegetables  Avoid: Regular canned vegetables. Regular canned tomato sauce and paste. Frozen vegetables in sauces. Olives. Rosita Fire. Relishes. Sauerkraut.   Choose: Low-sodium canned vegetables. Low-sodium tomato sauce and paste. Frozen or fresh vegetables. Fresh and frozen fruit.  Condiments  Avoid: Canned and packaged gravies. Worcestershire sauce. Tartar sauce. Barbecue sauce. Soy sauce. Steak sauce. Ketchup. Onion, garlic, and table salt. Meat flavorings and tenderizers.   Choose: Fresh and dried herbs and spices. Low-sodium varieties of mustard and ketchup. Lemon juice. Tabasco sauce. Horseradish.  SAMPLE 2 GRAM SODIUM MEAL PLAN Breakfast / Sodium (mg)  1 cup low-fat milk / 143 mg   2 slices whole-wheat toast / 270 mg   1 tbs heart-healthy margarine / 153 mg   1 hard-boiled egg / 139 mg   1 small orange / 0 mg  Lunch / Sodium (mg)  1 cup raw carrots / 76 mg    cup hummus / 298 mg   1 cup low-fat milk / 143 mg    cup red grapes / 2 mg   1 whole-wheat pita bread / 356 mg  Dinner / Sodium (mg)  1 cup whole-wheat pasta / 2 mg   1 cup low-sodium tomato sauce / 73 mg   3 oz lean ground beef / 57 mg   1 small side salad (1 cup raw  spinach leaves,  cup cucumber,  cup yellow bell pepper) with 1 tsp olive oil and 1 tsp red wine vinegar / 25 mg  Snack / Sodium (mg)  1 container low-fat vanilla yogurt / 107 mg   3 graham cracker squares / 127 mg  Nutrient Analysis  Calories: 2033   Protein: 77 g   Carbohydrate: 282 g   Fat: 72 g   Sodium: 1971 mg  Document Released: 06/16/2005 Document Revised: 02/26/2011 Document Reviewed: 09/17/2009 [facility] Patient Information 2012 [place], [place].

## 2011-08-14 NOTE — Telephone Encounter (Signed)
I called patient to let her know that she I scheduled her echo, PFT's and pulmonary consult. She told me that she thought she had one while in [facility]. I did find in system (chart review, under procedures 06/12/11). Does she still need the one I scheduled or can I cancel? Please advise.

## 2011-08-14 NOTE — Progress Notes (Signed)
Patient presents for med check.    COPD:  She had called earlier in the week wondering about whether or not she still needed to use her oxygen at night.  She has a problem with it in the evenings not staying in her nose, causing nose bleeds, breathes through her mouth at night, so wonders if it is even doing any good.  She doesn't use the oxygen with activity either.  She has a pulse ox monitor which she wears when she been out and walking around, and is always in the 90's.  She doesn't even bring the oxygen with her, but leaves it in the car.  Carries inhaler with her, but rarely needs to use it.  She only used nebulized meds the first week after hospitalization. She quit smoking.  Some shortness of breath with exertion--changing her bedsheets, mopping floors.  Can walk to mailbox and back without problems.  Not getting too much exercise.  Hypertension follow-up:  Blood pressures are not checked elsewhere.  Denies dizziness, headaches, chest pain.  Denies side effects of medications.  Hyperlipidemia follow-up:  Patient is reportedly following a low-fat, low cholesterol diet.  Compliant with medications and denies medication side effects.  Last lipids were within normal range in November.  LFT's had been elevated, and also were noted to be elevated during hospitalization.   Diabetes:  She was on insulin during hospitalization, due to the steroids.  She wasn't discharged on any medications, and has never been on meds for diabetes, always diet-controlled.  Sugars have been running <140 fasting.  Vitamin D deficiency.  Previously took 40981 (two 5000) units daily, but recently changed to 4000 (last week).  Per computer, last D level was 69 in November  Past Medical History  Diagnosis Date  . Allergy   . Elevated cholesterol   . Hypertension   . Genital HSV     gets on hip  . Diverticulosis   . 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 Vitamin D deficiency disease   . Osteopenia   . Migraine   . Elevated  liver enzymes     fatty liver per ultrasound per pt  . Impaired glucose tolerance   . Colon polyp   . Pneumonia 06/06/2011    Past Surgical History  Procedure Date  . Mastectomy 1992    R breast for cancer  . Colonoscopy 2009, 08/2010    Dr. Loreta Ave  . Cholecystectomy 2003  . Hernia repair 2003    umbilical    History   Social History  . Marital Status: Married    Spouse Name: N/A    Number of Children: 3  . Years of Education: N/A   Occupational History  . Retired    Social History Main Topics  . Smoking status: Former Smoker -- 1.0 packs/day for 46 years  . Smokeless tobacco: Never Used   Comment: cut back to a little over 1/2 PPD  . Alcohol Use: Yes     rarely, 1 drink per year maybe  . Drug Use: No  . Sexually Active: Yes -- Female partner(s)    Birth Control/ Protection: Post-menopausal   Other Topics Concern  . Not on file   Social History Narrative   Lives with her husband.  Children all live in [place] nearby. No pets    Family History  Problem Relation Age of Onset  . Heart disease Father   . Diabetes Father   . Hypertension Father   . Asthma  Sister   . Heart disease Mother     tachycardia  . Asthma Sister   . Cancer Sister 25    breast cancer    Current outpatient prescriptions:albuterol (PROVENTIL HFA;VENTOLIN HFA) 108 (90 BASE) MCG/ACT inhaler, Inhale 2 puffs into the lungs every 6 (six) hours as needed for wheezing., Disp: 18 g, Rfl: 0;  aspirin EC 81 MG tablet, Take 81 mg by mouth daily.  , Disp: , Rfl: ;  bisoprolol-hydrochlorothiazide (ZIAC) 10-6.25 MG per tablet, Take 1 tablet by mouth daily.  , Disp: , Rfl:  Calcium Carbonate-Vitamin D (CALCIUM 600+D) 600-400 MG-UNIT per tablet, Take 2 tablets by mouth daily.  , Disp: , Rfl: ;  cetirizine (ZYRTEC) 10 MG tablet, Take 10 mg by mouth daily.  , Disp: , Rfl: ;  Cholecalciferol (VITAMIN D) 2000 UNITS tablet, Take 4,000 Units by mouth daily., Disp: , Rfl: ;  Multiple Vitamins-Minerals (CENTRUM SILVER PO),  Take 1 tablet by mouth.  , Disp: , Rfl:  Omega-3 Fatty Acids (FISH OIL) 1200 MG CAPS, Take 2 capsules by mouth daily. , Disp: , Rfl: ;  Probiotic Product (HEALTHY COLON PO), Take 1 tablet by mouth daily.  , Disp: , Rfl: ;  simvastatin (ZOCOR) 20 MG tablet, Take 20 mg by mouth at bedtime.  , Disp: , Rfl: ;  ipratropium (ATROVENT) 0.02 % nebulizer solution, Take 2.5 mLs (0.5 mg total) by nebulization every 4 (four) hours as needed., Disp: 75 mL, Rfl: 0 Current facility-administered medications:albuterol (PROVENTIL) (2.5 MG/3ML) 0.083% nebulizer solution 2.5 mg, 2.5 mg, Nebulization, Once, Debbrah Alar, CMA  Allergies  Allergen Reactions  . Contrast Media (Iodinated Diagnostic Agents) Other (See Comments)    Could not breath  . Iohexol Other (See Comments)    Immediately could not breathe  . Latex   . Levaquin Other (See Comments)    insomnia  . Sulfa Antibiotics Other (See Comments)    Historical from mother, pt states that mother says she almost died from this drug  . Codeine Nausea Only, Anxiety and Other (See Comments)    insomnia  . Lipitor (Atorvastatin Calcium) Rash   ROS: Denies fevers, congestion, sore throat.  Denies cough. No chest pain, nausea, vomiting, bowel changes, urinary complaints, skin rashes, lesions or other concerns.  PHYSICAL EXAM BP 122/72  Pulse 68  Ht 5\' 4"  (1.626 m)  Wt 235 lb (106.595 kg)  BMI 40.34 kg/m2 Pleasant, overweight female in no distress HEENT:  PERRL, EOMI, conjunctiva clear.  OP clear Neck: no lymphadenopathy or thyromegaly or bruit Lungs: Decreased air movement throughout, clear, without wheezing, rales or ronchi Heart--regular rate and rhythm with murmur 2-3/6 at RUSB Abdomen: obese, soft, nontender Extremities: Trace edema, 2+ pulses, normal sensation Skin: no lesions/rashes Psych: normal mood, affect, hygiene and grooming  ASSESSMENT/PLAN: 1. COPD (chronic obstructive pulmonary disease)    2. Diabetes mellitus    3. Essential  hypertension, benign    4. Pure hypercholesterolemia    5. Type II or unspecified type diabetes mellitus without mention of complication, not stated as uncontrolled  Glucose, random, Hemoglobin A1c  6. Elevated LFTs  Hepatic function panel  7. Heart murmur  2D Echocardiogram without contrast   HTN--controlled.  Continue low sodium diet, current meds  Heart murmur--new per pt so check echo--records reviewed.  Had echo in December showing mild aortic stenosis. No need for echo  COPD--Refer to pulm--for baseline PFT's and consult.  She currently isn't on any preventative measures, just prn albuterol.  Doing better since  quitting smoking.  Will keep on this regimen until eval by pulmonary and PFT's to see if this is appropriate, or need to be more aggressive with preventative measures, depending on her PFT's.  Encouraged to remain tobacco-free, reminded her about available free counseling, etc.  Pneumonia--needs f/u CXR.  Orders in system, hasn't done yet.  Elevated LFT's--recheck today Hyperlipidemia--well controlled on current meds per November labs  Vitamin D deficiency--Continue at 4000 IU daily.  Will need to recheck in the next 3-6 months to ensure that levels remain normal (dose was decreased from 10000 to 4000 IU just this week).

## 2011-08-14 NOTE — Telephone Encounter (Signed)
Okay to cancel echo (I had been unable to locate the result in Epic--thanks!).  Echo results reviewed.  Very mild aortic stenosis.

## 2011-08-15 ENCOUNTER — Encounter: Payer: Self-pay | Admitting: Family Medicine

## 2011-08-15 ENCOUNTER — Other Ambulatory Visit: Payer: Self-pay | Admitting: Family Medicine

## 2011-08-15 ENCOUNTER — Telehealth: Payer: Self-pay | Admitting: Family Medicine

## 2011-08-15 DIAGNOSIS — J449 Chronic obstructive pulmonary disease, unspecified: Secondary | ICD-10-CM

## 2011-08-15 DIAGNOSIS — Z1231 Encounter for screening mammogram for malignant neoplasm of breast: Secondary | ICD-10-CM

## 2011-08-15 LAB — HEPATIC FUNCTION PANEL
ALT: 47 U/L — ABNORMAL HIGH (ref 0–35)
AST: 45 U/L — ABNORMAL HIGH (ref 0–37)
Albumin: 4.4 g/dL (ref 3.5–5.2)
Alkaline Phosphatase: 53 U/L (ref 39–117)
Bilirubin, Direct: 0.2 mg/dL (ref 0.0–0.3)
Indirect Bilirubin: 0.6 mg/dL (ref 0.0–0.9)
Total Bilirubin: 0.8 mg/dL (ref 0.3–1.2)
Total Protein: 6.6 g/dL (ref 6.0–8.3)

## 2011-08-15 LAB — HEMOGLOBIN A1C
Hgb A1c MFr Bld: 6.2 % — ABNORMAL HIGH (ref <5.7)
Mean Plasma Glucose: 131 mg/dL — ABNORMAL HIGH (ref <117)

## 2011-08-15 LAB — HM MAMMOGRAPHY: HM Mammogram: NORMAL

## 2011-08-15 LAB — GLUCOSE, RANDOM: Glucose, Bld: 116 mg/dL — ABNORMAL HIGH (ref 70–99)

## 2011-08-15 NOTE — Telephone Encounter (Signed)
When you call patient with the lab results, please also advise her of need for f/u CXR.  Last CXR she had was when she was ill, and is now due for a f/u CXR, to be done at her convenience.  Order for this CXR was placed by Dr. Susann Givens at her hospital f/u visit, but it doesn't appear that she has gone for the x-ray yet.

## 2011-08-18 ENCOUNTER — Encounter: Payer: Self-pay | Admitting: *Deleted

## 2011-08-18 ENCOUNTER — Telehealth: Payer: Self-pay | Admitting: *Deleted

## 2011-08-18 NOTE — Telephone Encounter (Signed)
Spoke with patient and she was unaware that a follow up CXR was ordered, she will go either today or tomorrow. Just an FYI. Thanks.

## 2011-08-18 NOTE — Telephone Encounter (Signed)
Patient called and said that she called [facility] to come and pick up her O2 and they said they would need something faxed from the physician's office stating that she was released from needing her O2 faxed to [phone].

## 2011-08-18 NOTE — Telephone Encounter (Signed)
Called [facility] and cancelled echocardiogram.

## 2011-08-18 NOTE — Telephone Encounter (Signed)
Faxed order to d/c 02 to [phone].

## 2011-08-18 NOTE — Telephone Encounter (Signed)
Okay to fax note stating that oxygen order is discontinued, no longer necessary

## 2011-08-19 ENCOUNTER — Ambulatory Visit
Admission: RE | Admit: 2011-08-19 | Discharge: 2011-08-19 | Disposition: A | Discharge status: Home / Self Care | Payer: BC Managed Care – PPO | Source: Ambulatory Visit | Attending: Family Medicine | Admitting: Family Medicine

## 2011-08-19 DIAGNOSIS — R0602 Shortness of breath: Secondary | ICD-10-CM

## 2011-08-21 ENCOUNTER — Other Ambulatory Visit (HOSPITAL_COMMUNITY): Payer: BC Managed Care – PPO | Admitting: Radiology

## 2011-08-28 ENCOUNTER — Ambulatory Visit (INDEPENDENT_AMBULATORY_CARE_PROVIDER_SITE_OTHER): Payer: BC Managed Care – PPO | Admitting: Internal Medicine

## 2011-08-28 DIAGNOSIS — J449 Chronic obstructive pulmonary disease, unspecified: Secondary | ICD-10-CM

## 2011-08-28 LAB — PULMONARY FUNCTION TEST

## 2011-08-28 NOTE — Progress Notes (Signed)
PFT done today. 

## 2011-09-01 ENCOUNTER — Encounter: Payer: Self-pay | Admitting: Internal Medicine

## 2011-09-01 ENCOUNTER — Ambulatory Visit (INDEPENDENT_AMBULATORY_CARE_PROVIDER_SITE_OTHER): Payer: BC Managed Care – PPO | Admitting: Internal Medicine

## 2011-09-01 DIAGNOSIS — J449 Chronic obstructive pulmonary disease, unspecified: Secondary | ICD-10-CM

## 2011-09-01 DIAGNOSIS — I10 Essential (primary) hypertension: Secondary | ICD-10-CM

## 2011-09-01 NOTE — Patient Instructions (Signed)
Only use your albuterol as a rescue medication (first the puffer/inhaler then the nebulizer (plan B)  to be used if you can't catch your breath by resting or doing a relaxed purse lip breathing pattern. The less you use it, the better it will work when you need it.  If you feel you need your nebulizer for any reason more than very rarely then call for appointment right away  Congratulations on not smoking, and remember the Primitivo Gauze rule we discussed   If you are satisfied with your treatment plan let your doctor know and he/she can either refill your medications or you can return here when your prescription runs out.     If in any way you are not 100% satisfied,  please tell us.  If 100% better, tell your friends!

## 2011-09-01 NOTE — Progress Notes (Signed)
  Subjective:    Patient ID: Shannon Schultz, female    DOB: 10-09-1945  MRN: [id number]  HPI  26 yowf quit smoking Dec 2012 [place]  > [facility] admit with abupt onset sob not back to baseline so referred 09/01/2011 by Dr Joselyn Arrow to pulmonary clinic  Date of Admission: 06/12/2011  Date of Discharge: 06/15/2011  Attending Physician:AKULA,VIJAYA  Patient's ZHY:[id number],NGEX Leonette Most, MD, MD  Consults: None  Discharge Diagnoses:  Present on Admission:  .Hypoxemia .Tobacco abuse  Copd exacerbation   09/01/2011 Cordaro Mukai/ 1st pulmonary ov cc persistent doe x making a bed, mopping, walking uphill,  Does ok shopping not using any meds since dec 2012 except  3 x hfa total, no cough or overt sinus or hb symptoms.  Sleeping ok without nocturnal  or early am exacerbation  of respiratory  c/o's or need for noct saba. Also denies any obvious fluctuation of symptoms with weather or environmental changes or other aggravating or alleviating factors except as outlined above    Review of Systems  Constitutional: Negative for fever, chills and unexpected weight change.  HENT: Negative for ear pain, nosebleeds, congestion, sore throat, rhinorrhea, sneezing, trouble swallowing, dental problem, voice change, postnasal drip and sinus pressure.   Eyes: Negative for visual disturbance.  Respiratory: Positive for shortness of breath. Negative for cough and choking.   Cardiovascular: Negative for chest pain and leg swelling.  Gastrointestinal: Negative for vomiting, abdominal pain and diarrhea.  Genitourinary: Negative for difficulty urinating.  Musculoskeletal: Negative for arthralgias.  Skin: Negative for rash.  Neurological: Negative for tremors, syncope and headaches.  Hematological: Does not bruise/bleed easily.       Objective:   Physical Exam  Wt  244 09/01/2011   HEENT mild turbinate edema.  Oropharynx no thrush or excess pnd or cobblestoning.  No JVD or cervical adenopathy. Mild accessory muscle hypertrophy.  Trachea midline, nl thryroid. Chest was hyperinflated by percussion with diminished breath sounds and moderate increased exp time without wheeze. Hoover sign positive at mid inspiration. Regular rate and rhythm without murmur gallop or rub or increase P2 or edema.  Abd: no hsm, nl excursion. Ext warm without cyanosis or clubbing.    cxr 08/19/11  Report nl    Assessment & Plan:

## 2011-09-02 NOTE — Assessment & Plan Note (Addendum)
GOLD II moderate COPD s/p smoking cessation with minimal symptoms with exertion for which she has minimal perceived need for saba  I reviewed the Flethcher curve with patient that basically indicates  if you quit smoking when your best day FEV1 is still relatively well preserved (which hers is) it is highly unlikely you will progress to severe disease and informed the patient there was no medication on the market that has proven to change the curve or the likelihood of progression.  Therefore stopping smoking and maintaining abstinence is the most important aspect of care, not choice of inhalers or for that matter, doctors.    If starts having more symptoms or need for saba would consider adding symbicort or advair. Since stopping smoking is the key "risk reduction step" and she has accomplished this, pulmonary f/u can be prn.

## 2011-09-08 ENCOUNTER — Ambulatory Visit (HOSPITAL_COMMUNITY)
Admission: RE | Admit: 2011-09-08 | Discharge: 2011-09-08 | Disposition: A | Discharge status: Home / Self Care | Payer: BC Managed Care – PPO | Source: Ambulatory Visit | Attending: Family Medicine | Admitting: Family Medicine

## 2011-09-08 DIAGNOSIS — Z1231 Encounter for screening mammogram for malignant neoplasm of breast: Secondary | ICD-10-CM

## 2011-10-10 ENCOUNTER — Other Ambulatory Visit: Payer: Self-pay | Admitting: Family Medicine

## 2011-10-30 ENCOUNTER — Ambulatory Visit: Payer: BC Managed Care – PPO | Admitting: Family Medicine

## 2011-11-25 ENCOUNTER — Other Ambulatory Visit: Payer: BC Managed Care – PPO

## 2011-11-25 DIAGNOSIS — E7800 Pure hypercholesterolemia, unspecified: Secondary | ICD-10-CM

## 2011-11-25 DIAGNOSIS — R748 Abnormal levels of other serum enzymes: Secondary | ICD-10-CM

## 2011-11-25 DIAGNOSIS — E119 Type 2 diabetes mellitus without complications: Secondary | ICD-10-CM

## 2011-11-25 LAB — HEPATIC FUNCTION PANEL
ALT: 59 U/L — ABNORMAL HIGH (ref 0–35)
AST: 66 U/L — ABNORMAL HIGH (ref 0–37)
Albumin: 4.1 g/dL (ref 3.5–5.2)
Alkaline Phosphatase: 62 U/L (ref 39–117)
Bilirubin, Direct: 0.2 mg/dL (ref 0.0–0.3)
Indirect Bilirubin: 0.4 mg/dL (ref 0.0–0.9)
Total Bilirubin: 0.6 mg/dL (ref 0.3–1.2)
Total Protein: 6.6 g/dL (ref 6.0–8.3)

## 2011-11-25 LAB — GLUCOSE, RANDOM: Glucose, Bld: 161 mg/dL — ABNORMAL HIGH (ref 70–99)

## 2011-11-25 LAB — LIPID PANEL
Cholesterol: 127 mg/dL (ref 0–200)
HDL: 37 mg/dL — ABNORMAL LOW (ref >39)
LDL Cholesterol: 69 mg/dL (ref 0–99)
Total CHOL/HDL Ratio: 3.4 Ratio
Triglycerides: 105 mg/dL (ref <150)
VLDL: 21 mg/dL (ref 0–40)

## 2011-11-26 LAB — HEMOGLOBIN A1C
Hgb A1c MFr Bld: 7.6 % — ABNORMAL HIGH (ref <5.7)
Mean Plasma Glucose: 171 mg/dL — ABNORMAL HIGH (ref <117)

## 2011-11-27 ENCOUNTER — Encounter: Payer: Self-pay | Admitting: Family Medicine

## 2011-11-27 ENCOUNTER — Ambulatory Visit (INDEPENDENT_AMBULATORY_CARE_PROVIDER_SITE_OTHER): Payer: BC Managed Care – PPO | Admitting: Family Medicine

## 2011-11-27 VITALS — BP 130/80 | HR 72 | Ht 64.0 in | Wt 244.0 lb

## 2011-11-27 DIAGNOSIS — IMO0001 Reserved for inherently not codable concepts without codable children: Secondary | ICD-10-CM

## 2011-11-27 DIAGNOSIS — R7401 Elevation of levels of liver transaminase levels: Secondary | ICD-10-CM

## 2011-11-27 DIAGNOSIS — I10 Essential (primary) hypertension: Secondary | ICD-10-CM

## 2011-11-27 DIAGNOSIS — R7402 Elevation of levels of lactic acid dehydrogenase (LDH): Secondary | ICD-10-CM

## 2011-11-27 MED ORDER — METFORMIN HCL ER 500 MG PO TB24: 500.0000 mg | ORAL_TABLET | Freq: Every day | ORAL | Status: DC

## 2011-11-27 NOTE — Patient Instructions (Signed)
Start metformin once daily.   Exercise at least 30 minutes daily, ideally 45-60 mins Stay away from white stuff/carbs--eat more vegetables, fruit (limit to 2-3 servings daily).  Check sugars twice daily and bring list to your next appointment in 3 months.  Keep up the good work staying away from smoking !

## 2011-11-27 NOTE — Progress Notes (Signed)
Chief Complaint  Patient presents with  . Med check    6 month follow up/med check on HTN, lipids and DM.   HPI: Patient presents for 6 month follow up on her chronic medical conditions.  She quit smoking in early December.  She has gained about 13 pounds since quitting smoking.  Hasn't been exercising other than yardwork.  Admits that her diet has been totally out of control.    Diabetes:  Has been diet-controlled until this point.  Only required medications while in hospital and on steroids.  Sugars have been running 120-237.  Hyperlipidemia follow-up:  Admits dietary noncompliance recently; denies medication side effects, compliant with meds.  Hypertension follow-up:  Blood pressures are not checked elsewhere.  Denies dizziness, headaches, chest pain.  Denies side effects of medications.  Past Medical History  Diagnosis Date  . Allergy   . Elevated cholesterol   . Hypertension   . Genital HSV     gets on hip  . Diverticulosis   . 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 Vitamin D deficiency disease   . Osteopenia   . Migraine   . Elevated liver enzymes     fatty liver per ultrasound per pt  . Impaired glucose tolerance   . Colon polyp   . Pneumonia 06/06/2011   Past Surgical History  Procedure Date  . Mastectomy 1992    R breast for cancer  . Colonoscopy 2009, 08/2010    Dr. Loreta Ave  . Cholecystectomy 2003  . Hernia repair 2003    umbilical   History   Social History  . Marital Status: Married    Spouse Name: N/A    Number of Children: 3  . Years of Education: N/A   Occupational History  . Retired    Social History Main Topics  . Smoking status: Former Smoker -- 1.0 packs/day for 46 years    Types: Cigarettes    Quit date: 05/31/2011  . Smokeless tobacco: Never Used  . Alcohol Use: Yes     rarely, 1 drink per year maybe  . Drug Use: No  . Sexually Active: Yes -- Female partner(s)    Birth Control/ Protection: Post-menopausal   Other Topics Concern  . Not on file     Social History Narrative   Lives with her husband.  Children all live in [place] nearby. No pets    Current Outpatient Prescriptions on File Prior to Visit  Medication Sig Dispense Refill  . albuterol (PROVENTIL HFA;VENTOLIN HFA) 108 (90 BASE) MCG/ACT inhaler Inhale 2 puffs into the lungs every 6 (six) hours as needed for wheezing.  18 g  0  . aspirin EC 81 MG tablet Take 81 mg by mouth daily.        . bisoprolol-hydrochlorothiazide (ZIAC) 10-6.25 MG per tablet TAKE 1 TABLET DAILY  90 tablet  0  . Calcium Carbonate-Vitamin D (CALCIUM 600+D) 600-400 MG-UNIT per tablet Take 2 tablets by mouth daily.        . cetirizine (ZYRTEC) 10 MG tablet Take 10 mg by mouth daily.        . Cholecalciferol (VITAMIN D) 2000 UNITS tablet Take 4,000 Units by mouth daily.      Marland Kitchen ipratropium (ATROVENT) 0.02 % nebulizer solution Take 2.5 mLs (0.5 mg total) by nebulization every 4 (four) hours as needed.  75 mL  0  . Multiple Vitamins-Minerals (CENTRUM SILVER PO) Take 1 tablet by mouth.        . Omega-3  Fatty Acids (FISH OIL) 1200 MG CAPS Take 2 capsules by mouth daily.       . Probiotic Product (HEALTHY COLON PO) Take 1 capsule by mouth daily.      . simvastatin (ZOCOR) 20 MG tablet TAKE 1 TABLET AT BEDTIME  90 tablet  0   Current Facility-Administered Medications on File Prior to Visit  Medication Dose Route Frequency Provider Last Rate Last Dose  . albuterol (PROVENTIL) (2.5 MG/3ML) 0.083% nebulizer solution 2.5 mg  2.5 mg Nebulization Once Melonie Florida, CMA        Allergies  Allergen Reactions  . Contrast Media (Iodinated Diagnostic Agents) Other (See Comments)    Could not breath  . Iohexol Other (See Comments)    Immediately could not breathe  . Latex   . Levofloxacin Other (See Comments)    insomnia  . Sulfa Antibiotics Other (See Comments)    Historical from mother, pt states that mother says she almost died from this drug  . Codeine Nausea Only, Anxiety and Other (See Comments)    insomnia  .  Lipitor (Atorvastatin Calcium) Rash   ROS:  Denies fevers, URI symptoms, cough, shortness of breath, chest pain, palpitations, headaches, dizziness, rashes, GI complaints or other concerns. +weight gain. Denies hair/skin changes, bowel changes, fatigue  PHYSICAL EXAM: BP 130/80  Pulse 72  Ht 5\' 4"  (1.626 m)  Wt 244 lb (110.678 kg)  BMI 41.88 kg/m2 Pleasant, obese female in no distress.  Became tearful when mentioned the need for medications to treat her sugars. Neck: no lymphadenopathy, thyromegaly or mass Heart: 3/6 SEM at RUSB.  No rubs, gallops Lungs: clear bilaterally with good air movement Extremities: Venous stasis changes, trace edema Skin: no lesions  Lab Results  Component Value Date   HGBA1C 7.6* 11/25/2011   Lab Results  Component Value Date   CHOL 127 11/25/2011   HDL 37* 11/25/2011   LDLCALC 69 11/25/2011   TRIG 105 11/25/2011   CHOLHDL 3.4 11/25/2011   Lab Results  Component Value Date   ALT 59* 11/25/2011   AST 66* 11/25/2011   ALKPHOS 62 11/25/2011   BILITOT 0.6 11/25/2011   ASSESSMENT/PLAN: 1. Type II or unspecified type diabetes mellitus without mention of complication, uncontrolled  metFORMIN (GLUCOPHAGE-XR) 500 MG 24 hr tablet  2. Essential hypertension, benign    3. HTN (hypertension)    4. Transaminitis     DM--Reviewed diet, exercise, need for weight loss.  Discussed coping techniques when craving cigarettes other than eating.  Start metformin 500mg  ER once daily.  Likely needs more, but leaving her room to improve with lifestyle/behavioral changes  HTN--controlled.  Continue current medications.  Lipids--at goal, HDL low. Continue current medications.  Elevated LFT's--stable.  F/u 3 months, sooner if sugars continue to remain >140-150  25 min visit, more than 1/2 counseling.

## 2012-01-29 ENCOUNTER — Other Ambulatory Visit: Payer: Self-pay | Admitting: Family Medicine

## 2012-02-20 ENCOUNTER — Encounter: Payer: Self-pay | Admitting: Medical

## 2012-02-20 ENCOUNTER — Ambulatory Visit (INDEPENDENT_AMBULATORY_CARE_PROVIDER_SITE_OTHER): Payer: BC Managed Care – PPO | Admitting: Medical

## 2012-02-20 VITALS — BP 110/70 | HR 76 | Temp 98.1°F | Resp 16 | Wt 246.0 lb

## 2012-02-20 DIAGNOSIS — J40 Bronchitis, not specified as acute or chronic: Secondary | ICD-10-CM

## 2012-02-20 MED ORDER — AMOXICILLIN-POT CLAVULANATE 875-125 MG PO TABS: 1.0000 | ORAL_TABLET | Freq: Two times a day (BID) | ORAL | Status: AC

## 2012-02-20 NOTE — Patient Instructions (Signed)
Rest, drink plenty of fluids especially water, begin OTC Mucinex DM or Coricidin HBP.  Begin Augmentin twice daily for ten days, and you can use OTC tylenol for fever and feeling bad.

## 2012-02-20 NOTE — Progress Notes (Signed)
Subjective:   HPI  Shannon Schultz is a 66 y.o. female who presents with respiratory infection.   Been having cough, burning when she breaths, left ear hurts, has a lot of congestion x few days, worsening.  She notes sweats, low grade fever, some left sinus pressure.  Denies nausea, vomiting.  The last time this happened it turned into pneumonia (early December 2012), diagnosed with COPD at that time, was hospitalized for 9 days.  She has since quit tobacco.   Not using any inhalers currently.   No sick contacts.  Using nothing yet for symptoms.  No other aggravating or relieving factors.    No other c/o.  The following portions of the patient's history were reviewed and updated as appropriate: allergies, current medications, past family history, past medical history, past social history, past surgical history and problem list.  Past Medical History  Diagnosis Date  . Allergy   . Elevated cholesterol   . Hypertension   . Genital HSV     gets on hip  . Diverticulosis   . 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 Vitamin D deficiency disease   . Osteopenia   . Migraine   . Elevated liver enzymes     fatty liver per ultrasound per pt  . Impaired glucose tolerance   . Colon polyp   . Pneumonia 06/06/2011    Allergies  Allergen Reactions  . Contrast Media (Iodinated Diagnostic Agents) Other (See Comments)    Could not breath  . Iohexol Other (See Comments)    Immediately could not breathe  . Latex   . Levofloxacin Other (See Comments)    insomnia  . Sulfa Antibiotics Other (See Comments)    Historical from mother, pt states that mother says she almost died from this drug  . Codeine Nausea Only, Anxiety and Other (See Comments)    insomnia  . Lipitor (Atorvastatin Calcium) Rash     Review of Systems ROS reviewed and was negative other than noted in HPI or above.    Objective:   Physical Exam  General appearance: alert, no distress, WD/WN HEENT: normocephalic, sclerae anicteric,  left TM flat, right TM pearly, nares patent, no discharge or erythema, pharynx normal Oral cavity: MMM, no lesions Neck: supple, no lymphadenopathy, no thyromegaly, no masses Heart: faint II/VI brief systolic murmur heard in left upper sternal border, otherwise RRR, normal S1, S2 Lungs: CTA bilaterally, no wheezes, rhonchi, or rales Pulses: 2+ symmetric  Assessment and Plan :     Encounter Diagnosis  Name Primary?  . Bronchitis Yes   Advised rest, hydration, begin Mucinex DM, Augmentin, and return if not improving over the weekend.

## 2012-03-03 ENCOUNTER — Ambulatory Visit (INDEPENDENT_AMBULATORY_CARE_PROVIDER_SITE_OTHER): Payer: BC Managed Care – PPO | Admitting: Family Medicine

## 2012-03-03 ENCOUNTER — Encounter: Payer: Self-pay | Admitting: Family Medicine

## 2012-03-03 VITALS — BP 140/80 | HR 64 | Ht 64.0 in | Wt 246.0 lb

## 2012-03-03 DIAGNOSIS — E7800 Pure hypercholesterolemia, unspecified: Secondary | ICD-10-CM

## 2012-03-03 DIAGNOSIS — E119 Type 2 diabetes mellitus without complications: Secondary | ICD-10-CM

## 2012-03-03 DIAGNOSIS — I10 Essential (primary) hypertension: Secondary | ICD-10-CM

## 2012-03-03 DIAGNOSIS — J449 Chronic obstructive pulmonary disease, unspecified: Secondary | ICD-10-CM

## 2012-03-03 DIAGNOSIS — IMO0001 Reserved for inherently not codable concepts without codable children: Secondary | ICD-10-CM

## 2012-03-03 LAB — POCT GLYCOSYLATED HEMOGLOBIN (HGB A1C): Hemoglobin A1C: 7.4

## 2012-03-03 MED ORDER — METFORMIN HCL ER 500 MG PO TB24: 1000.0000 mg | ORAL_TABLET | Freq: Every day | ORAL | Status: DC

## 2012-03-03 NOTE — Progress Notes (Signed)
Chief Complaint  Patient presents with  . Diabetes    fasting med check.   HPI:  Patient presents for follow up on her diabetes.  She was very motivated to lose weight after her last visit.  She worked hard for 2 weeks, but didn't lose anything and got very discouraged, and admits that she has done "absolutely nothing" since then.  Taking Metformin once daily without side effects.  Sugar was 176 this morning, ranging from 140-200 recently.  Denies polydipsia or polyuria; ophtho about 1 year ago.  Checks feet regularly and has no concerns.   Hypertension follow-up:  Blood pressures are not checked elsewhere.  Denies headaches, chest pain.  Denies side effects of medications.  Has some slight dizziness with head movements, and knows she has some fluid in her ears. Very mild.  Recently treated for bronchitis--symptoms have completely resolved with course of Augmentin, and is back to her baseline.  Still has slight postnasal drip, and has allergies--chronic runny nose, itchy eyes.  Denies sneezing.  COPD has been stable, and she has remained smoke-free.  Past Medical History  Diagnosis Date  . Allergy   . Elevated cholesterol   . Hypertension   . Genital HSV     gets on hip  . Diverticulosis   . 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 Vitamin D deficiency disease   . Osteopenia   . Migraine   . Elevated liver enzymes     fatty liver per ultrasound per pt  . Impaired glucose tolerance   . Colon polyp   . Pneumonia 06/06/2011   Past Surgical History  Procedure Date  . Mastectomy 1992    R breast for cancer  . Colonoscopy 2009, 08/2010    Dr. Loreta Ave  . Cholecystectomy 2003  . Hernia repair 2003    umbilical   History   Social History  . Marital Status: Married    Spouse Name: N/A    Number of Children: 3  . Years of Education: N/A   Occupational History  . Retired    Social History Main Topics  . Smoking status: Former Smoker -- 1.0 packs/day for 46 years    Types: Cigarettes   Quit date: 05/31/2011  . Smokeless tobacco: Never Used  . Alcohol Use: Yes     rarely, 1 drink per year maybe  . Drug Use: No  . Sexually Active: Yes -- Female partner(s)    Birth Control/ Protection: Post-menopausal   Other Topics Concern  . Not on file   Social History Narrative   Lives with her husband.  Children all live in [place] nearby. No pets    Current Outpatient Prescriptions on File Prior to Visit  Medication Sig Dispense Refill  . albuterol (PROVENTIL HFA;VENTOLIN HFA) 108 (90 BASE) MCG/ACT inhaler Inhale 2 puffs into the lungs every 6 (six) hours as needed for wheezing.  18 g  0  . aspirin EC 81 MG tablet Take 81 mg by mouth daily.        . bisoprolol-hydrochlorothiazide (ZIAC) 10-6.25 MG per tablet TAKE 1 TABLET DAILY  90 tablet  3  . Calcium Carbonate-Vitamin D (CALCIUM 600+D) 600-400 MG-UNIT per tablet Take 2 tablets by mouth daily.        . cetirizine (ZYRTEC) 10 MG tablet Take 10 mg by mouth daily.        . Cholecalciferol (VITAMIN D) 2000 UNITS tablet Take 4,000 Units by mouth daily.      Marland Kitchen ipratropium (  ATROVENT) 0.02 % nebulizer solution Take 2.5 mLs (0.5 mg total) by nebulization every 4 (four) hours as needed.  75 mL  0  . metFORMIN (GLUCOPHAGE-XR) 500 MG 24 hr tablet Take 1 tablet (500 mg total) by mouth daily with breakfast.  30 tablet  3  . Multiple Vitamins-Minerals (CENTRUM SILVER PO) Take 1 tablet by mouth.        . Omega-3 Fatty Acids (FISH OIL) 1200 MG CAPS Take 2 capsules by mouth daily.       . Probiotic Product (HEALTHY COLON PO) Take 1 capsule by mouth daily.      . simvastatin (ZOCOR) 20 MG tablet TAKE 1 TABLET AT BEDTIME  90 tablet  1  . valACYclovir (VALTREX) 500 MG tablet Take 500 mg by mouth daily.       Current Facility-Administered Medications on File Prior to Visit  Medication Dose Route Frequency Provider Last Rate Last Dose  . albuterol (PROVENTIL) (2.5 MG/3ML) 0.083% nebulizer solution 2.5 mg  2.5 mg Nebulization Once Melonie Florida, CMA         Allergies  Allergen Reactions  . Contrast Media (Iodinated Diagnostic Agents) Other (See Comments)    Could not breath  . Iohexol Other (See Comments)    Immediately could not breathe  . Latex   . Levofloxacin Other (See Comments)    insomnia  . Sulfa Antibiotics Other (See Comments)    Historical from mother, pt states that mother says she almost died from this drug  . Codeine Nausea Only, Anxiety and Other (See Comments)    insomnia  . Lipitor (Atorvastatin Calcium) Rash   ROS:  Denies fevers, shortness of breath.  Reports poor endurance with exercise, but no wheezing/tightness.  Denies GI complaints, headaches, dizziness, skin concerns.  Denies depression.  Sometimes feels down, but bounces back quickly.    PHYSICAL EXAM: BP 140/80  Pulse 64  Ht 5\' 4"  (1.626 m)  Wt 246 lb (111.585 kg)  BMI 42.23 kg/m2 Well developed, obese female, who became tearful during visit.  Full range of affect exhibited. Neck: no lymphadenopathy, thyromegaly or mass Heart: regular rate and rhythm without systolic murmur loudest at RUSB, unchanged Lungs: clear with fair air movement. Abdomen: soft, nontender, no organomegaly or mass Trace edema.  Venous stasis changes. Psych: normal hygiene, grooming, speech, eye contact.  +crying, full range of affect.  Lab Results  Component Value Date   HGBA1C 7.4 03/03/2012    ASSESSMENT/PLAN:  1. Type II or unspecified type diabetes mellitus without mention of complication, not stated as uncontrolled  HgB A1c  2. Hypertension    3. Pure hypercholesterolemia    4. Type II or unspecified type diabetes mellitus without mention of complication, uncontrolled  metFORMIN (GLUCOPHAGE-XR) 500 MG 24 hr tablet  5. COPD (chronic obstructive pulmonary disease)     Suboptimal control of DM.  Increase Metformin ER to 2 tablets daily.   Discussed diet and exercise in detail, as well as reasonable expectations--do not weigh self.  Gradually work up to 30 minutes of  exercise daily.  Choose foods wisely--avoiding sweets, healthy snacks, and small portions.    Schedule yearly eye exam  Discussed depression signs/symptoms and to return if worsening, or if moods are interfering with her motivation   F/u in December for CPE, Fasting labs prior

## 2012-03-03 NOTE — Patient Instructions (Signed)
Increase Metformin ER to 2 tablets daily (can take both at the same time)  We discussed diet and exercise in detail, as well as reasonable expectations--do not weigh self.  Gradually work up to 30 minutes of exercise daily.  Choose foods wisely--avoiding sweets, healthy snacks, and small portions.  If you have wheezing/tightness/shortness of breath with exercise, try using the albuterol inhaler 15-20 minutes prior to exercise and this should help.  Schedule yearly eye exam

## 2012-03-04 ENCOUNTER — Encounter: Payer: Self-pay | Admitting: Family Medicine

## 2012-03-15 ENCOUNTER — Other Ambulatory Visit: Payer: Self-pay | Admitting: Family Medicine

## 2012-04-10 ENCOUNTER — Other Ambulatory Visit: Payer: Self-pay | Admitting: Family Medicine

## 2012-05-12 LAB — HM DIABETES EYE EXAM

## 2012-06-02 ENCOUNTER — Other Ambulatory Visit: Payer: BC Managed Care – PPO

## 2012-06-02 DIAGNOSIS — E7800 Pure hypercholesterolemia, unspecified: Secondary | ICD-10-CM

## 2012-06-02 DIAGNOSIS — IMO0001 Reserved for inherently not codable concepts without codable children: Secondary | ICD-10-CM

## 2012-06-02 LAB — HEMOGLOBIN A1C
Hgb A1c MFr Bld: 7.6 % — ABNORMAL HIGH (ref <5.7)
Mean Plasma Glucose: 171 mg/dL — ABNORMAL HIGH (ref <117)

## 2012-06-03 LAB — LIPID PANEL
Cholesterol: 128 mg/dL (ref 0–200)
HDL: 38 mg/dL — ABNORMAL LOW (ref >39)
LDL Cholesterol: 71 mg/dL (ref 0–99)
Total CHOL/HDL Ratio: 3.4 Ratio
Triglycerides: 95 mg/dL (ref <150)
VLDL: 19 mg/dL (ref 0–40)

## 2012-06-03 LAB — COMPREHENSIVE METABOLIC PANEL
ALT: 52 U/L — ABNORMAL HIGH (ref 0–35)
AST: 59 U/L — ABNORMAL HIGH (ref 0–37)
Albumin: 4.3 g/dL (ref 3.5–5.2)
Alkaline Phosphatase: 62 U/L (ref 39–117)
BUN: 21 mg/dL (ref 6–23)
CO2: 30 mEq/L (ref 19–32)
Calcium: 9.8 mg/dL (ref 8.4–10.5)
Chloride: 102 mEq/L (ref 96–112)
Creat: 0.76 mg/dL (ref 0.50–1.10)
Glucose, Bld: 165 mg/dL — ABNORMAL HIGH (ref 70–99)
Potassium: 4.9 mEq/L (ref 3.5–5.3)
Sodium: 138 mEq/L (ref 135–145)
Total Bilirubin: 0.9 mg/dL (ref 0.3–1.2)
Total Protein: 6.8 g/dL (ref 6.0–8.3)

## 2012-06-03 LAB — MICROALBUMIN / CREATININE URINE RATIO
Creatinine, Urine: 60.6 mg/dL
Microalb Creat Ratio: 8.4 mg/g (ref 0.0–30.0)
Microalb, Ur: 0.51 mg/dL (ref 0.00–1.89)

## 2012-06-03 LAB — TSH: TSH: 2.629 u[IU]/mL (ref 0.350–4.500)

## 2012-06-07 ENCOUNTER — Ambulatory Visit (INDEPENDENT_AMBULATORY_CARE_PROVIDER_SITE_OTHER): Payer: BC Managed Care – PPO | Admitting: Family Medicine

## 2012-06-07 ENCOUNTER — Encounter: Payer: Self-pay | Admitting: Family Medicine

## 2012-06-07 VITALS — BP 132/80 | HR 72 | Ht 64.0 in | Wt 242.0 lb

## 2012-06-07 DIAGNOSIS — Z23 Encounter for immunization: Secondary | ICD-10-CM

## 2012-06-07 DIAGNOSIS — Z0000 Encounter for general adult medical examination without abnormal findings: Secondary | ICD-10-CM

## 2012-06-07 DIAGNOSIS — I10 Essential (primary) hypertension: Secondary | ICD-10-CM

## 2012-06-07 DIAGNOSIS — B009 Herpesviral infection, unspecified: Secondary | ICD-10-CM

## 2012-06-07 DIAGNOSIS — IMO0001 Reserved for inherently not codable concepts without codable children: Secondary | ICD-10-CM

## 2012-06-07 DIAGNOSIS — R7402 Elevation of levels of lactic acid dehydrogenase (LDH): Secondary | ICD-10-CM

## 2012-06-07 DIAGNOSIS — E7800 Pure hypercholesterolemia, unspecified: Secondary | ICD-10-CM

## 2012-06-07 DIAGNOSIS — R7401 Elevation of levels of liver transaminase levels: Secondary | ICD-10-CM

## 2012-06-07 LAB — POCT URINALYSIS DIPSTICK
Bilirubin, UA: NEGATIVE
Blood, UA: NEGATIVE
Glucose, UA: NEGATIVE
Ketones, UA: NEGATIVE
Leukocytes, UA: NEGATIVE
Nitrite, UA: NEGATIVE
Protein, UA: NEGATIVE
Spec Grav, UA: 1.02
Urobilinogen, UA: NEGATIVE
pH, UA: 5

## 2012-06-07 MED ORDER — SIMVASTATIN 20 MG PO TABS: 20.0000 mg | ORAL_TABLET | Freq: Every day | ORAL | Status: DC

## 2012-06-07 MED ORDER — METFORMIN HCL ER 500 MG PO TB24: 2000.0000 mg | ORAL_TABLET | Freq: Every day | ORAL | Status: DC

## 2012-06-07 NOTE — Progress Notes (Signed)
Chief Complaint  Patient presents with  . Annual Exam    annual exam, labs done 06/02/12. Last Pap 05/2010.   Shannon Schultz is a 66 y.o. female who presents for a complete physical. She is also here for med check, and had labs drawn prior to appointment.  Diabetes follow-up:  Blood sugars at home are running 160 (130 this morning, which was lower than usual).  Denies hypoglycemia.  Denies polydipsia and polyuria.  Last eye exam was 04/2012, and everything was okay.  Patient follows a low sugar diet and checks feet regularly without concerns. Dry skin on feet.  Denies numbness, tingling or sores/lesions. Metformin dose was increased from qd to BID at last visit.  Denies any side effects, but sugars remain high.  She has not been exercising regularly.  Hypertension follow-up:  Blood pressures are not checked elsewhere.  Denies dizziness, headaches, chest pain.  Denies side effects of medications.  Hyperlipidemia follow-up:  Patient is reportedly following a low-fat, low cholesterol diet.  Compliant with medications and denies medication side effects.   COPD--since quitting smoking last year, she really hasn't had any significant respiratory symptoms.  Not needing inhaler (just rarely, when allergies flare).  She gained weight related to the steroids last year, and hasn't been able to lose it.  Health maintenance: Immunization History  Administered Date(s) Administered  . Influenza Split 05/28/2011  . Pneumococcal Conjugate 12/15/2004  . Pneumococcal Polysaccharide 05/28/2011  . Tdap 02/23/2008  . Zoster 05/17/2010   Last Pap smear: 2011 Last mammogram: 09/09/11 Last colonoscopy: 08/2010 Last DEXA: 07/2010 Dentist: yearly Ophtho: yearly (04/2012) Exercise: nothing regular.  She would like to go back to [employer]  Past Medical History  Diagnosis Date  . Allergy   . Elevated cholesterol   . Hypertension   . Genital HSV     gets on hip  . Diverticulosis   . Cancer 1992    R breast, DCIS   . Vitamin D deficiency disease   . Osteopenia   . Migraine   . Elevated liver enzymes     fatty liver per ultrasound per pt  . Impaired glucose tolerance   . Colon polyp   . Pneumonia 06/06/2011  . Type 2 diabetes mellitus   . HSV (herpes simplex virus) infection     on hip--on daily suppression    Past Surgical History  Procedure Date  . Mastectomy 1992    R breast for cancer  . Colonoscopy 2009, 08/2010    Dr. Loreta Ave  . Cholecystectomy 2003  . Hernia repair 2003    umbilical    History   Social History  . Marital Status: Married    Spouse Name: N/A    Number of Children: 3  . Years of Education: N/A   Occupational History  . Retired    Social History Main Topics  . Smoking status: Former Smoker -- 1.0 packs/day for 46 years    Types: Cigarettes    Quit date: 05/31/2011  . Smokeless tobacco: Never Used  . Alcohol Use: Yes     Comment: rarely, 1 drink per year maybe  . Drug Use: No  . Sexually Active: Yes -- Female partner(s)    Birth Control/ Protection: Post-menopausal   Other Topics Concern  . Not on file   Social History Narrative   Lives with her husband.  Children all live in [place] nearby. No pets    Family History  Problem Relation Age of Onset  . Heart disease Father   .  Diabetes Father   . Hypertension Father   . Asthma Sister   . Allergies Sister   . Heart disease Mother     tachycardia  . Asthma Sister   . Allergies Sister   . Cancer Sister 57    breast cancer  . Tuberculosis Maternal Grandfather     Current outpatient prescriptions:aspirin EC 81 MG tablet, Take 81 mg by mouth daily.  , Disp: , Rfl: ;  bisoprolol-hydrochlorothiazide (ZIAC) 10-6.25 MG per tablet, TAKE 1 TABLET DAILY, Disp: 90 tablet, Rfl: 3;  Calcium Carbonate-Vitamin D (CALCIUM 600+D) 600-400 MG-UNIT per tablet, Take 2 tablets by mouth daily.  , Disp: , Rfl: ;  cetirizine (ZYRTEC) 10 MG tablet, Take 10 mg by mouth daily.  , Disp: , Rfl:  Cholecalciferol (VITAMIN D) 2000 UNITS  tablet, Take 4,000 Units by mouth daily., Disp: , Rfl: ;  metFORMIN (GLUCOPHAGE-XR) 500 MG 24 hr tablet, Take 4 tablets (2,000 mg total) by mouth daily with breakfast., Disp: 360 tablet, Rfl: 1;  Multiple Vitamins-Minerals (CENTRUM SILVER PO), Take 1 tablet by mouth.  , Disp: , Rfl: ;  Omega-3 Fatty Acids (FISH OIL) 1200 MG CAPS, Take 2 capsules by mouth daily. , Disp: , Rfl:  Probiotic Product (HEALTHY COLON PO), Take 1 capsule by mouth daily., Disp: , Rfl: ;  simvastatin (ZOCOR) 20 MG tablet, Take 1 tablet (20 mg total) by mouth at bedtime., Disp: 90 tablet, Rfl: 1;  valACYclovir (VALTREX) 500 MG tablet, Take 500 mg by mouth daily., Disp: , Rfl: ;  [DISCONTINUED] metFORMIN (GLUCOPHAGE-XR) 500 MG 24 hr tablet, TAKE 1 TABLET (500 MG TOTAL) BY MOUTH DAILY WITH BREAKFAST., Disp: 30 tablet, Rfl: 3 [DISCONTINUED] metFORMIN (GLUCOPHAGE-XR) 500 MG 24 hr tablet, , Disp: , Rfl: ;  [DISCONTINUED] simvastatin (ZOCOR) 20 MG tablet, TAKE 1 TABLET AT BEDTIME, Disp: 90 tablet, Rfl: 1;  albuterol (PROVENTIL HFA;VENTOLIN HFA) 108 (90 BASE) MCG/ACT inhaler, Inhale 2 puffs into the lungs every 6 (six) hours as needed for wheezing., Disp: 18 g, Rfl: 0 [DISCONTINUED] metFORMIN (GLUCOPHAGE-XR) 500 MG 24 hr tablet, Take 2 tablets (1,000 mg total) by mouth daily with breakfast., Disp: 180 tablet, Rfl: 1;  [DISCONTINUED] metFORMIN (GLUCOPHAGE-XR) 500 MG 24 hr tablet, TAKE 1 TABLET (500 MG TOTAL) BY MOUTH DAILY WITH BREAKFAST., Disp: 30 tablet, Rfl: 4 Current facility-administered medications:albuterol (PROVENTIL) (2.5 MG/3ML) 0.083% nebulizer solution 2.5 mg, 2.5 mg, Nebulization, Once, Melonie Florida, CMA  Allergies  Allergen Reactions  . Contrast Media (Iodinated Diagnostic Agents) Other (See Comments)    Could not breath  . Iohexol Other (See Comments)    Immediately could not breathe  . Latex   . Levofloxacin Other (See Comments)    insomnia  . Sulfa Antibiotics Other (See Comments)    Historical from mother, pt  states that mother says she almost died from this drug  . Codeine Nausea Only, Anxiety and Other (See Comments)    insomnia  . Lipitor (Atorvastatin Calcium) Rash   ROS: The patient denies anorexia, fever, weight changes, headaches, vision changes, decreased hearing, ear pain, sore throat, breast concerns, chest pain, palpitations, dizziness, syncope, dyspnea on exertion, cough, swelling, nausea, vomiting, diarrhea, constipation, abdominal pain, melena, hematochezia, indigestion/heartburn, hematuria, incontinence, dysuria, vaginal bleeding, discharge, odor or itch, genital lesions, joint pains, numbness, tingling, weakness, tremor, suspicious skin lesions, depression, anxiety, abnormal bleeding/bruising, or enlarged lymph nodes. Denies sleep apnea (husband never mentioned to her, and she doesn't have fatigue).  PHYSICAL EXAM: BP 132/80  Pulse 72  Ht 5\' 4"  (  1.626 m)  Wt 242 lb (109.77 kg)  BMI 41.54 kg/m2  General Appearance:  Alert, cooperative, no distress, appears stated age   Head:  Normocephalic, without obvious abnormality, atraumatic   Eyes:  PERRL, conjunctiva/corneas clear, EOM's intact, fundi  benign   Ears:  Normal TM's and external ear canals   Nose:  Nares normal, mucosa normal, no drainage or sinus tenderness   Throat:  Lips, mucosa, and tongue normal; teeth and gums normal   Neck:  Supple, no lymphadenopathy; thyroid: no enlargement/tenderness/nodules; no carotid  bruit or JVD   Back:  Spine nontender, no curvature, ROM normal, no CVA tenderness   Lungs:  Clear to auscultation bilaterally without wheezes, rales or ronchi; respirations unlabored   Chest Wall:  No tenderness or deformity (other than absence of R breast, WHSS)  Heart:  Regular rate and rhythm, S1 and S2 normal, no rub  or gallop.  3/6 SEM loudest at RUSB, and radiates into R carotid  Breast Exam:  No tenderness, masses, or nipple discharge or inversion of L breast. No axillary lymphadenopathy. Right breast is  absent.   Abdomen:  Soft, non-tender, nondistended, normoactive bowel sounds,  no masses, no hepatosplenomegaly. + abdominal obesity   Genitalia:  Normal external genitalia without lesions. BUS and vagina normal; No cervical motion tenderness. No abnormal vaginal discharge. Uterus and adnexa not enlarged, nontender, no masses, although exam is limited by body habitus. Pap not performed   Rectal:  Normal tone, no masses or tenderness; guaiac negative stool   Extremities:  Trace pretibial edema.  Normal diabetic foot exam  Pulses:  2+ and symmetric all extremities   Skin:  Skin color, texture, turgor normal, no rashes or lesions. Venous stasis changes bilateral lower legs with some prominent superficial veins in feet giving bluish discoloration. Feet are very dry  Lymph nodes:  Cervical, supraclavicular, and axillary nodes normal   Neurologic:  CNII-XII intact, normal strength, sensation and gait; reflexes 2+ and symmetric throughout   Psych: Normal mood, affect, hygiene and grooming.   Lab Results  Component Value Date   HGBA1C 7.6* 06/02/2012     Chemistry      Component Value Date/Time   NA 138 06/02/2012 0832   K 4.9 06/02/2012 0832   CL 102 06/02/2012 0832   CO2 30 06/02/2012 0832   BUN 21 06/02/2012 0832   CREATININE 0.76 06/02/2012 0832   CREATININE 0.75 06/14/2011 0530      Component Value Date/Time   CALCIUM 9.8 06/02/2012 0832   ALKPHOS 62 06/02/2012 0832   AST 59* 06/02/2012 0832   ALT 52* 06/02/2012 0832   BILITOT 0.9 06/02/2012 0832     Fasting glucose 165  Lab Results  Component Value Date   CHOL 128 06/02/2012   HDL 38* 06/02/2012   LDLCALC 71 06/02/2012   TRIG 95 06/02/2012   CHOLHDL 3.4 06/02/2012   Lab Results  Component Value Date   TSH 2.629 06/02/2012   Microalbumin/Cr ratio 8.4 (normal)  ASSESSMENT/PLAN: 1. Routine general medical examination at a health care facility  Visual acuity screening, POCT Urinalysis Dipstick  2. Need for prophylactic vaccination and  inoculation against influenza  Flu vaccine greater than or equal to 3yo preservative free IM  3. Pure hypercholesterolemia  simvastatin (ZOCOR) 20 MG tablet  4. Hypertension    5. Type II or unspecified type diabetes mellitus without mention of complication, uncontrolled  metFORMIN (GLUCOPHAGE-XR) 500 MG 24 hr tablet  6. Transaminitis    7. HSV (herpes  simplex virus) infection    8. Morbid obesity with body mass index of 40.0-49.9      Elevated LFT's--stable.  Check q6 months HTN--controlled.  Encouraged weight loss, daily exercise Hyperlipidemia--at goal on current meds, continue DM--suboptimally controlled on current regimen.  Start exercise routine, try and lose weight.  Increase metformin to 1500mg  daily.  If sugars remain >140 after 3-4 weeks, then increase to 2000mg  daily.  Call if not tolerating these doses, in which case will change to a combo med with lower metformin dose.  F/u 3 months (A1c at visit)  Heart murmur--chart reviewed:  Had echo 05/2011 mild aortic stenosis  Discussed monthly self breast exams and yearly mammograms after the age of 10; at least 30 minutes of aerobic activity at least 5 days/week; proper sunscreen use reviewed; healthy diet, including goals of calcium and vitamin D intake and alcohol recommendations (less than or equal to 1 drink/day) reviewed; regular seatbelt use; changing batteries in smoke detectors. Immunization recommendations discussed--flu shot given today. Colonoscopy recommendations reviewed--UTD.

## 2012-06-07 NOTE — Patient Instructions (Addendum)
HEALTH MAINTENANCE RECOMMENDATIONS:  It is recommended that you get at least 30 minutes of aerobic exercise at least 5 days/week (for weight loss, you may need as much as 60-90 minutes). This can be any activity that gets your heart rate up. This can be divided in 10-15 minute intervals if needed, but try and build up your endurance at least once a week.  Weight bearing exercise is also recommended twice weekly.  Eat a healthy diet with lots of vegetables, fruits and fiber.  "Colorful" foods have a lot of vitamins (ie green vegetables, tomatoes, red peppers, etc).  Limit sweet tea, regular sodas and alcoholic beverages, all of which has a lot of calories and sugar.  Up to 1 alcoholic drink daily may be beneficial for women (unless trying to lose weight, watch sugars).  Drink a lot of water.  Calcium recommendations are 1200-1500 mg daily (1500 mg for postmenopausal women or women without ovaries), and vitamin D 1000 IU daily.  This should be obtained from diet and/or supplements (vitamins), and calcium should not be taken all at once, but in divided doses.  Monthly self breast exams and yearly mammograms for women over the age of 23 is recommended.  Sunscreen of at least SPF 30 should be used on all sun-exposed parts of the skin when outside between the hours of 10 am and 4 pm (not just when at beach or pool, but even with exercise, golf, tennis, and yard work!)  Use a sunscreen that says "broad spectrum" so it covers both UVA and UVB rays, and make sure to reapply every 1-2 hours.  Remember to change the batteries in your smoke detectors when changing your clock times in the spring and fall.  Use your seat belt every time you are in a car, and please drive safely and not be distracted with cell phones and texting while driving.  Start exercise routine, try and lose weight.  Increase metformin to 1500mg  daily (3 tablets).  If sugars remain >140 after 3-4 weeks, then increase to 2000mg  daily (4  tablets daily).  Call if not tolerating these doses, in which case will change to a combo med with lower metformin dose.    Lab Results  Component Value Date   HGBA1C 7.6* 06/02/2012     Chemistry      Component Value Date/Time   NA 138 06/02/2012 0832   K 4.9 06/02/2012 0832   CL 102 06/02/2012 0832   CO2 30 06/02/2012 0832   BUN 21 06/02/2012 0832   CREATININE 0.76 06/02/2012 0832   CREATININE 0.75 06/14/2011 0530      Component Value Date/Time   CALCIUM 9.8 06/02/2012 0832   ALKPHOS 62 06/02/2012 0832   AST 59* 06/02/2012 0832   ALT 52* 06/02/2012 0832   BILITOT 0.9 06/02/2012 0832     Fasting glucose 165  Lab Results  Component Value Date   CHOL 128 06/02/2012   HDL 38* 06/02/2012   LDLCALC 71 06/02/2012   TRIG 95 06/02/2012   CHOLHDL 3.4 06/02/2012   Lab Results  Component Value Date   TSH 2.629 06/02/2012   Microalbumin/Cr ratio 8.4 (normal)

## 2012-06-09 ENCOUNTER — Encounter: Payer: Self-pay | Admitting: Internal Medicine

## 2012-08-19 ENCOUNTER — Other Ambulatory Visit: Payer: Self-pay | Admitting: Family Medicine

## 2012-08-19 DIAGNOSIS — Z1231 Encounter for screening mammogram for malignant neoplasm of breast: Secondary | ICD-10-CM

## 2012-09-09 ENCOUNTER — Ambulatory Visit: Payer: BC Managed Care – PPO | Admitting: Family Medicine

## 2012-09-14 ENCOUNTER — Other Ambulatory Visit: Payer: Self-pay | Admitting: Family Medicine

## 2012-09-14 ENCOUNTER — Ambulatory Visit (HOSPITAL_COMMUNITY)
Admission: RE | Admit: 2012-09-14 | Discharge: 2012-09-14 | Disposition: A | Discharge status: Home / Self Care | Payer: BC Managed Care – PPO | Source: Ambulatory Visit | Attending: Family Medicine | Admitting: Family Medicine

## 2012-09-14 DIAGNOSIS — Z1231 Encounter for screening mammogram for malignant neoplasm of breast: Secondary | ICD-10-CM

## 2012-09-14 LAB — HM PAP SMEAR: HM Pap smear: NORMAL

## 2012-09-23 ENCOUNTER — Encounter: Payer: Self-pay | Admitting: Family Medicine

## 2012-09-23 ENCOUNTER — Ambulatory Visit (INDEPENDENT_AMBULATORY_CARE_PROVIDER_SITE_OTHER): Payer: BC Managed Care – PPO | Admitting: Family Medicine

## 2012-09-23 VITALS — BP 130/70 | HR 68 | Ht 64.0 in | Wt 243.0 lb

## 2012-09-23 DIAGNOSIS — Z79899 Other long term (current) drug therapy: Secondary | ICD-10-CM

## 2012-09-23 DIAGNOSIS — E119 Type 2 diabetes mellitus without complications: Secondary | ICD-10-CM

## 2012-09-23 LAB — POCT GLYCOSYLATED HEMOGLOBIN (HGB A1C): Hemoglobin A1C: 6.5

## 2012-09-23 NOTE — Progress Notes (Signed)
Chief Complaint  Patient presents with  . Diabetes    3 month nonfasting follow up.   Patient presents for follow up on her diabetes.  Has been taking 2000mg  of metformin daily.  She is splitting the dose to twice daily, due to diarrhea.  Splitting dose has helped it some, but still having frequent stools.  Sugars haven't improved as much as she hoped.  Sugars are running 150's fasting (130-160). Hasn't been able to exercise, never joined [employer] as she planned.  She continues to not smoke.  Denies any other complaints.  Past Medical History  Diagnosis Date  . Allergy   . Elevated cholesterol   . Hypertension   . Genital HSV     gets on hip  . Diverticulosis   . 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 Vitamin D deficiency disease   . Osteopenia   . Migraine   . Elevated liver enzymes     fatty liver per ultrasound per pt  . Impaired glucose tolerance   . Colon polyp   . Pneumonia 06/06/2011  . Type 2 diabetes mellitus   . HSV (herpes simplex virus) infection     on hip--on daily suppression  . Heart murmur      echo 05/2011 mild aortic stenosis   History   Social History  . Marital Status: Married    Spouse Name: N/A    Number of Children: 3  . Years of Education: N/A   Occupational History  . Retired    Social History Main Topics  . Smoking status: Former Smoker -- 1.00 packs/day for 46 years    Types: Cigarettes    Quit date: 05/31/2011  . Smokeless tobacco: Never Used  . Alcohol Use: Yes     Comment: rarely, 1 drink per year maybe  . Drug Use: No  . Sexually Active: Yes -- Female partner(s)    Birth Control/ Protection: Post-menopausal   Other Topics Concern  . Not on file   Social History Narrative   Lives with her husband.  Children all live in [place] nearby. No pets   Current outpatient prescriptions:aspirin EC 81 MG tablet, Take 81 mg by mouth daily.  , Disp: , Rfl: ;  bisoprolol-hydrochlorothiazide (ZIAC) 10-6.25 MG per tablet, TAKE 1 TABLET DAILY, Disp: 90 tablet,  Rfl: 3;  Calcium Carbonate-Vitamin D (CALCIUM 600+D) 600-400 MG-UNIT per tablet, Take 2 tablets by mouth daily.  , Disp: , Rfl: ;  cetirizine (ZYRTEC) 10 MG tablet, Take 10 mg by mouth daily.  , Disp: , Rfl:  Cholecalciferol (VITAMIN D) 2000 UNITS tablet, Take 4,000 Units by mouth daily., Disp: , Rfl: ;  metFORMIN (GLUCOPHAGE-XR) 500 MG 24 hr tablet, Take 4 tablets (2,000 mg total) by mouth daily with breakfast., Disp: 360 tablet, Rfl: 1;  Multiple Vitamins-Minerals (CENTRUM SILVER PO), Take 1 tablet by mouth.  , Disp: , Rfl: ;  simvastatin (ZOCOR) 20 MG tablet, Take 1 tablet (20 mg total) by mouth at bedtime., Disp: 90 tablet, Rfl: 1 valACYclovir (VALTREX) 500 MG tablet, Take 500 mg by mouth daily., Disp: , Rfl: ;  albuterol (PROVENTIL HFA;VENTOLIN HFA) 108 (90 BASE) MCG/ACT inhaler, Inhale 2 puffs into the lungs every 6 (six) hours as needed for wheezing., Disp: 18 g, Rfl: 0;  Omega-3 Fatty Acids (FISH OIL) 1200 MG CAPS, Take 2 capsules by mouth daily. , Disp: , Rfl:  Current facility-administered medications:albuterol (PROVENTIL) (2.5 MG/3ML) 0.083% nebulizer solution 2.5 mg, 2.5 mg, Nebulization, Once, Veronica F  Smith  Allergies  Allergen Reactions  . Contrast Media (Iodinated Diagnostic Agents) Other (See Comments)    Could not breath  . Iohexol Other (See Comments)    Immediately could not breathe  . Latex   . Levofloxacin Other (See Comments)    insomnia  . Sulfa Antibiotics Other (See Comments)    Historical from mother, pt states that mother says she almost died from this drug  . Codeine Nausea Only, Anxiety and Other (See Comments)    insomnia  . Lipitor (Atorvastatin Calcium) Rash   ROS:  Denies headaches, vision changes, URI symptoms, fevers, cough, shortness of breath, chest pain.  Denies nausea, vomiting.  +diarrhea related to metformin use.  No skin rashes, depression, or other concerns except as per HPI.  PHYSICAL EXAM: BP 130/70  Pulse 68  Ht 5\' 4"  (1.626 m)  Wt 243 lb  (110.224 kg)  BMI 41.69 kg/m2 Pleasant female, in no distress Remainder of visit limited to discussion of diet, sugars, labs (no further exam).  Lab Results  Component Value Date   HGBA1C 6.5 09/23/2012   ASSESSMENT/PLAN: Type II or unspecified type diabetes mellitus without mention of complication, not stated as uncontrolled - Plan: HgB A1c  DM--improved control. We discussed lower metformin and adding second agent if needed to help with GI side effects.  She reports that current GI effects are tolerable at the BID dosing, and prefers not to start a new medication.  A1c is now at goal.  Encouraged daily exercise and weight loss to help reduce insulin resistance which should get sugars down further.  F/u 3 months, with labs prior

## 2012-09-23 NOTE — Patient Instructions (Addendum)
It is recommended that you get at least 30 minutes of aerobic exercise at least 5 days/week (for weight loss, you may need as much as 60-90 minutes). This can be any activity that gets your heart rate up. This can be divided in 10-15 minute intervals if needed, but try and build up your endurance at least once a week.  Weight bearing exercise is also recommended twice weekly.  Continue your metformin twice daily.  Hoping to see fasting sugars improve some with daily exercise and weight loss.

## 2012-11-01 ENCOUNTER — Telehealth: Payer: Self-pay | Admitting: Family Medicine

## 2012-11-01 DIAGNOSIS — B009 Herpesviral infection, unspecified: Secondary | ICD-10-CM

## 2012-11-01 MED ORDER — VALACYCLOVIR HCL 500 MG PO TABS: 500.0000 mg | ORAL_TABLET | Freq: Every day | ORAL | Status: DC

## 2012-11-01 NOTE — Telephone Encounter (Signed)
done

## 2012-11-01 NOTE — Telephone Encounter (Signed)
PT NEEDS REFILL ON VALTREX SENT TO [facility]

## 2012-12-22 ENCOUNTER — Other Ambulatory Visit: Payer: BC Managed Care – PPO

## 2012-12-22 DIAGNOSIS — Z79899 Other long term (current) drug therapy: Secondary | ICD-10-CM

## 2012-12-22 DIAGNOSIS — E119 Type 2 diabetes mellitus without complications: Secondary | ICD-10-CM

## 2012-12-22 LAB — LIPID PANEL
Cholesterol: 107 mg/dL (ref 0–200)
HDL: 36 mg/dL — ABNORMAL LOW (ref >39)
LDL Cholesterol: 49 mg/dL (ref 0–99)
Total CHOL/HDL Ratio: 3 Ratio
Triglycerides: 110 mg/dL (ref <150)
VLDL: 22 mg/dL (ref 0–40)

## 2012-12-22 LAB — COMPREHENSIVE METABOLIC PANEL
ALT: 45 U/L — ABNORMAL HIGH (ref 0–35)
AST: 50 U/L — ABNORMAL HIGH (ref 0–37)
Albumin: 4.2 g/dL (ref 3.5–5.2)
Alkaline Phosphatase: 62 U/L (ref 39–117)
BUN: 15 mg/dL (ref 6–23)
CO2: 29 mEq/L (ref 19–32)
Calcium: 9.5 mg/dL (ref 8.4–10.5)
Chloride: 101 mEq/L (ref 96–112)
Creat: 0.75 mg/dL (ref 0.50–1.10)
Glucose, Bld: 144 mg/dL — ABNORMAL HIGH (ref 70–99)
Potassium: 4.9 mEq/L (ref 3.5–5.3)
Sodium: 139 mEq/L (ref 135–145)
Total Bilirubin: 0.7 mg/dL (ref 0.3–1.2)
Total Protein: 6.7 g/dL (ref 6.0–8.3)

## 2012-12-22 LAB — HEMOGLOBIN A1C
Hgb A1c MFr Bld: 6.7 % — ABNORMAL HIGH (ref <5.7)
Mean Plasma Glucose: 146 mg/dL — ABNORMAL HIGH (ref <117)

## 2012-12-29 ENCOUNTER — Encounter: Payer: Self-pay | Admitting: Family Medicine

## 2012-12-29 ENCOUNTER — Ambulatory Visit (INDEPENDENT_AMBULATORY_CARE_PROVIDER_SITE_OTHER): Payer: BC Managed Care – PPO | Admitting: Family Medicine

## 2012-12-29 VITALS — BP 130/74 | HR 72 | Ht 64.0 in | Wt 239.0 lb

## 2012-12-29 DIAGNOSIS — IMO0001 Reserved for inherently not codable concepts without codable children: Secondary | ICD-10-CM

## 2012-12-29 DIAGNOSIS — I10 Essential (primary) hypertension: Secondary | ICD-10-CM

## 2012-12-29 DIAGNOSIS — E7800 Pure hypercholesterolemia, unspecified: Secondary | ICD-10-CM

## 2012-12-29 DIAGNOSIS — E119 Type 2 diabetes mellitus without complications: Secondary | ICD-10-CM

## 2012-12-29 DIAGNOSIS — R7402 Elevation of levels of lactic acid dehydrogenase (LDH): Secondary | ICD-10-CM

## 2012-12-29 DIAGNOSIS — R7401 Elevation of levels of liver transaminase levels: Secondary | ICD-10-CM

## 2012-12-29 MED ORDER — BISOPROLOL-HYDROCHLOROTHIAZIDE 10-6.25 MG PO TABS: ORAL_TABLET | ORAL | Status: DC

## 2012-12-29 MED ORDER — SIMVASTATIN 20 MG PO TABS: 20.0000 mg | ORAL_TABLET | Freq: Every day | ORAL | Status: DC

## 2012-12-29 MED ORDER — METFORMIN HCL ER 500 MG PO TB24: 2000.0000 mg | ORAL_TABLET | Freq: Every day | ORAL | Status: DC

## 2012-12-29 NOTE — Patient Instructions (Signed)
It is recommended that you get at least 30 minutes of aerobic exercise at least 5 days/week (for weight loss, you may need as much as 60-90 minutes). This can be any activity that gets your heart rate up. This can be divided in 10-15 minute intervals if needed, but try and build up your endurance at least once a week.  Weight bearing exercise is also recommended twice weekly.  Continue your current medications.  Return in 6 months for physical, with fasting labs done ahead of time.

## 2012-12-29 NOTE — Progress Notes (Signed)
Chief Complaint  Patient presents with  . Diabetes    nonfasting med check, labs already done.   Diabetes follow-up:  Blood sugars at home are running 140-150.  Denies hypoglycemia.  Denies polydipsia and polyuria.  Last eye exam was 07/2012.  Patient follows a low sugar diet and checks feet regularly without concerns.  Hypertension follow-up:  Blood pressures are not checked elsewhere.  Denies dizziness, headaches, chest pain, edema.  Denies side effects of medications.  HSV:  Valtrex was bothering her stomach, so she stopped taking it preventatively.  Prefers to use it just as needed for outbreaks.  Stress is reduced since her mother-in-law passed away, so frequency of outbreaks has been reduced.  Hyperlipidemia follow-up:  Patient is reportedly following a low-fat, low cholesterol diet.  Compliant with medications and denies medication side effects.  She recently went back on Weight Watcher's.  She cut back on her sweets, and eating fewer carbs.  Changed to vinaigrette dressings, although eating less salad due to GI issues.  Past Medical History  Diagnosis Date  . Allergy   . Elevated cholesterol   . Hypertension   . Genital HSV     gets on hip  . Diverticulosis   . 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 Vitamin D deficiency disease   . Osteopenia   . Migraine   . Elevated liver enzymes     fatty liver per ultrasound per pt  . Impaired glucose tolerance   . Colon polyp   . Pneumonia 06/06/2011  . Type 2 diabetes mellitus   . HSV (herpes simplex virus) infection     on hip--on daily suppression  . Heart murmur      echo 05/2011 mild aortic stenosis   Past Surgical History  Procedure Laterality Date  . Mastectomy  1992    R breast for cancer  . Colonoscopy  2009, 08/2010    Dr. Loreta Ave  . Cholecystectomy  2003  . Hernia repair  2003    umbilical   History   Social History  . Marital Status: Married    Spouse Name: N/A    Number of Children: 3  . Years of Education: N/A    Occupational History  . Retired    Social History Main Topics  . Smoking status: Former Smoker -- 1.00 packs/day for 46 years    Types: Cigarettes    Quit date: 05/31/2011  . Smokeless tobacco: Never Used  . Alcohol Use: Yes     Comment: rarely, 1 drink per year maybe  . Drug Use: No  . Sexually Active: Yes -- Female partner(s)    Birth Control/ Protection: Post-menopausal   Other Topics Concern  . Not on file   Social History Narrative   Lives with her husband.  Children all live in [place] nearby. No pets   Current outpatient prescriptions:aspirin EC 81 MG tablet, Take 81 mg by mouth daily.  , Disp: , Rfl: ;  bisoprolol-hydrochlorothiazide (ZIAC) 10-6.25 MG per tablet, TAKE 1 TABLET DAILY, Disp: 90 tablet, Rfl: 3;  Calcium Carbonate-Vitamin D (CALCIUM 600+D) 600-400 MG-UNIT per tablet, Take 2 tablets by mouth daily.  , Disp: , Rfl: ;  cetirizine (ZYRTEC) 10 MG tablet, Take 10 mg by mouth daily.  , Disp: , Rfl:  Cholecalciferol (VITAMIN D) 2000 UNITS tablet, Take 4,000 Units by mouth daily., Disp: , Rfl: ;  metFORMIN (GLUCOPHAGE-XR) 500 MG 24 hr tablet, Take 4 tablets (2,000 mg total) by mouth daily with breakfast.,  Disp: 360 tablet, Rfl: 1;  Multiple Vitamins-Minerals (CENTRUM SILVER PO), Take 1 tablet by mouth.  , Disp: , Rfl: ;  simvastatin (ZOCOR) 20 MG tablet, Take 1 tablet (20 mg total) by mouth at bedtime., Disp: 90 tablet, Rfl: 1 albuterol (PROVENTIL HFA;VENTOLIN HFA) 108 (90 BASE) MCG/ACT inhaler, Inhale 2 puffs into the lungs every 6 (six) hours as needed for wheezing., Disp: 18 g, Rfl: 0;  Omega-3 Fatty Acids (FISH OIL) 1200 MG CAPS, Take 2 capsules by mouth daily. , Disp: , Rfl:  Current facility-administered medications:albuterol (PROVENTIL) (2.5 MG/3ML) 0.083% nebulizer solution 2.5 mg, 2.5 mg, Nebulization, Once, Veronica F Smith  Allergies  Allergen Reactions  . Contrast Media (Iodinated Diagnostic Agents) Other (See Comments)    Could not breath  . Iohexol Other (See  Comments)    Immediately could not breathe  . Latex   . Levofloxacin Other (See Comments)    insomnia  . Sulfa Antibiotics Other (See Comments)    Historical from mother, pt states that mother says she almost died from this drug  . Codeine Nausea Only, Anxiety and Other (See Comments)    insomnia  . Lipitor (Atorvastatin Calcium) Rash   ROS:  Denies fever, URI symptoms, cough, shortness of breath, chest pain, nausea, vomiting, heartburn, abdominal pain.  Occasional diarrhea, no blood in stool.  Denies easy bleeding/bruising.  Moods are good.  Denies chest pain, headaches, dizziness, swelling in legs/feet.  PHYSICAL EXAM: BP 130/74  Pulse 72  Ht 5\' 4"  (1.626 m)  Wt 239 lb (108.41 kg)  BMI 41 kg/m2 Well developed, pleasant female in no distress HEENT:  PERRL, EOMI, conjunctiva clear Neck: no lymphadenopathy, thyromegaly or carotid bruit Heart: regular rate and rhythm with 2-3/6 SEM loudest at RUSB Lungs: clear bilaterally Abdomen: soft, obese.  Very mild tenderness at RUQ Extremities:  Venous stasis changes with hyperpigmentation.  2+ pulses.  Trace pitting edema Skin: no rashes or lesions Psych: normal mood, affect, hygiene and grooming Neuro: alert and oriented. Normal strength, sensation, gait.  Cranial nerves grossly intact.   Lab Results  Component Value Date   HGBA1C 6.7* 12/22/2012     Chemistry      Component Value Date/Time   NA 139 12/22/2012 0845   K 4.9 12/22/2012 0845   CL 101 12/22/2012 0845   CO2 29 12/22/2012 0845   BUN 15 12/22/2012 0845   CREATININE 0.75 12/22/2012 0845   CREATININE 0.75 06/14/2011 0530      Component Value Date/Time   CALCIUM 9.5 12/22/2012 0845   ALKPHOS 62 12/22/2012 0845   AST 50* 12/22/2012 0845   ALT 45* 12/22/2012 0845   BILITOT 0.7 12/22/2012 0845     Glucose 144  Lab Results  Component Value Date   CHOL 107 12/22/2012   HDL 36* 12/22/2012   LDLCALC 49 12/22/2012   TRIG 110 12/22/2012   CHOLHDL 3.0 12/22/2012    ASSESSMENT/PLAN: HTN (hypertension) - controlled - Plan: bisoprolol-hydrochlorothiazide (ZIAC) 10-6.25 MG per tablet  Type II or unspecified type diabetes mellitus without mention of complication, not stated as uncontrolled - controlled  Pure hypercholesterolemia - controlled - Plan: simvastatin (ZOCOR) 20 MG tablet  Transaminitis - stable  Type II or unspecified type diabetes mellitus without mention of complication, uncontrolled - Plan: metFORMIN (GLUCOPHAGE-XR) 500 MG 24 hr tablet  HTN--controlled.  Encouraged daily exercise, weight loss. DM--controlled.  (daily exercise and weight loss recommended).  Heart murmur--stable, unchanged.  Patient had many questions.  Echo results reviewed with her: Study Conclusions: -  Left ventricle: The cavity size was normal. Wall thickness was increased in a pattern of mild LVH. Systolic function was normal. The estimated ejection fraction was in the range of 55% to 60%. Wall motion was normal; there were no regional wall motion abnormalities. - Aortic valve: There was very mild stenosis. - Atrial septum: No defect or patent foramen ovale was identified.  F/u 6 months for CPE/med check, with fasting labs prior.

## 2013-01-20 ENCOUNTER — Telehealth: Payer: Self-pay | Admitting: Internal Medicine

## 2013-01-20 MED ORDER — GLUCOSE BLOOD VI STRP: ORAL_STRIP | Status: DC

## 2013-01-20 MED ORDER — BAYER MICROLET LANCETS MISC: Status: DC

## 2013-01-20 NOTE — Telephone Encounter (Signed)
Done

## 2013-01-20 NOTE — Telephone Encounter (Signed)
Pt needs lancets and test strips sent in for her contour meter to [facility]

## 2013-04-07 ENCOUNTER — Other Ambulatory Visit: Payer: BC Managed Care – PPO

## 2013-04-07 ENCOUNTER — Other Ambulatory Visit (INDEPENDENT_AMBULATORY_CARE_PROVIDER_SITE_OTHER): Payer: BC Managed Care – PPO

## 2013-04-07 DIAGNOSIS — Z23 Encounter for immunization: Secondary | ICD-10-CM

## 2013-07-01 ENCOUNTER — Other Ambulatory Visit: Payer: BC Managed Care – PPO

## 2013-07-01 DIAGNOSIS — E119 Type 2 diabetes mellitus without complications: Secondary | ICD-10-CM

## 2013-07-01 DIAGNOSIS — E7800 Pure hypercholesterolemia, unspecified: Secondary | ICD-10-CM

## 2013-07-01 DIAGNOSIS — I10 Essential (primary) hypertension: Secondary | ICD-10-CM

## 2013-07-01 LAB — COMPREHENSIVE METABOLIC PANEL
ALT: 26 U/L (ref 0–35)
AST: 27 U/L (ref 0–37)
Albumin: 4.1 g/dL (ref 3.5–5.2)
Alkaline Phosphatase: 56 U/L (ref 39–117)
BUN: 20 mg/dL (ref 6–23)
CO2: 29 mEq/L (ref 19–32)
Calcium: 9.7 mg/dL (ref 8.4–10.5)
Chloride: 100 mEq/L (ref 96–112)
Creat: 0.81 mg/dL (ref 0.50–1.10)
Glucose, Bld: 131 mg/dL — ABNORMAL HIGH (ref 70–99)
Potassium: 4.8 mEq/L (ref 3.5–5.3)
Sodium: 139 mEq/L (ref 135–145)
Total Bilirubin: 0.7 mg/dL (ref 0.3–1.2)
Total Protein: 7 g/dL (ref 6.0–8.3)

## 2013-07-01 LAB — LIPID PANEL
Cholesterol: 131 mg/dL (ref 0–200)
HDL: 46 mg/dL (ref >39)
LDL Cholesterol: 61 mg/dL (ref 0–99)
Total CHOL/HDL Ratio: 2.8 Ratio
Triglycerides: 122 mg/dL (ref <150)
VLDL: 24 mg/dL (ref 0–40)

## 2013-07-01 LAB — TSH: TSH: 2.636 u[IU]/mL (ref 0.350–4.500)

## 2013-07-01 LAB — HEMOGLOBIN A1C
Hgb A1c MFr Bld: 6.3 % — ABNORMAL HIGH (ref <5.7)
Mean Plasma Glucose: 134 mg/dL — ABNORMAL HIGH (ref <117)

## 2013-07-02 LAB — MICROALBUMIN / CREATININE URINE RATIO
Creatinine, Urine: 40.7 mg/dL
Microalb Creat Ratio: 13.3 mg/g (ref 0.0–30.0)
Microalb, Ur: 0.54 mg/dL (ref 0.00–1.89)

## 2013-07-06 ENCOUNTER — Encounter: Payer: Self-pay | Admitting: Family Medicine

## 2013-07-06 ENCOUNTER — Ambulatory Visit (INDEPENDENT_AMBULATORY_CARE_PROVIDER_SITE_OTHER): Payer: BC Managed Care – PPO | Admitting: Family Medicine

## 2013-07-06 ENCOUNTER — Other Ambulatory Visit (HOSPITAL_COMMUNITY)
Admission: RE | Admit: 2013-07-06 | Discharge: 2013-07-06 | Disposition: A | Discharge status: Home / Self Care | Payer: BC Managed Care – PPO | Source: Ambulatory Visit | Attending: Family Medicine | Admitting: Family Medicine

## 2013-07-06 VITALS — BP 120/64 | HR 76 | Ht 64.0 in | Wt 224.0 lb

## 2013-07-06 DIAGNOSIS — R740 Nonspecific elevation of levels of transaminase and lactic acid dehydrogenase [LDH]: Secondary | ICD-10-CM

## 2013-07-06 DIAGNOSIS — E7800 Pure hypercholesterolemia, unspecified: Secondary | ICD-10-CM

## 2013-07-06 DIAGNOSIS — Z01419 Encounter for gynecological examination (general) (routine) without abnormal findings: Secondary | ICD-10-CM | POA: Insufficient documentation

## 2013-07-06 DIAGNOSIS — Z1151 Encounter for screening for human papillomavirus (HPV): Secondary | ICD-10-CM | POA: Insufficient documentation

## 2013-07-06 DIAGNOSIS — Z0000 Encounter for general adult medical examination without abnormal findings: Secondary | ICD-10-CM

## 2013-07-06 DIAGNOSIS — J449 Chronic obstructive pulmonary disease, unspecified: Secondary | ICD-10-CM

## 2013-07-06 DIAGNOSIS — I10 Essential (primary) hypertension: Secondary | ICD-10-CM

## 2013-07-06 DIAGNOSIS — E119 Type 2 diabetes mellitus without complications: Secondary | ICD-10-CM

## 2013-07-06 DIAGNOSIS — R7402 Elevation of levels of lactic acid dehydrogenase (LDH): Secondary | ICD-10-CM

## 2013-07-06 DIAGNOSIS — R7401 Elevation of levels of liver transaminase levels: Secondary | ICD-10-CM

## 2013-07-06 DIAGNOSIS — E669 Obesity, unspecified: Secondary | ICD-10-CM

## 2013-07-06 LAB — POCT URINALYSIS DIPSTICK
Bilirubin, UA: NEGATIVE
Blood, UA: NEGATIVE
Glucose, UA: NEGATIVE
Ketones, UA: NEGATIVE
Leukocytes, UA: NEGATIVE
Nitrite, UA: NEGATIVE
Protein, UA: NEGATIVE
Spec Grav, UA: 1.02
Urobilinogen, UA: NEGATIVE
pH, UA: 5

## 2013-07-06 MED ORDER — ALBUTEROL SULFATE HFA 108 (90 BASE) MCG/ACT IN AERS: 2.0000 | INHALATION_SPRAY | Freq: Four times a day (QID) | RESPIRATORY_TRACT | Status: DC | PRN

## 2013-07-06 MED ORDER — GLUCOSE BLOOD VI STRP: ORAL_STRIP | Status: DC

## 2013-07-06 MED ORDER — SIMVASTATIN 20 MG PO TABS: 20.0000 mg | ORAL_TABLET | Freq: Every day | ORAL | Status: DC

## 2013-07-06 NOTE — Patient Instructions (Signed)
  HEALTH MAINTENANCE RECOMMENDATIONS:  It is recommended that you get at least 30 minutes of aerobic exercise at least 5 days/week (for weight loss, you may need as much as 60-90 minutes). This can be any activity that gets your heart rate up. This can be divided in 10-15 minute intervals if needed, but try and build up your endurance at least once a week.  Weight bearing exercise is also recommended twice weekly.  Eat a healthy diet with lots of vegetables, fruits and fiber.  "Colorful" foods have a lot of vitamins (ie green vegetables, tomatoes, red peppers, etc).  Limit sweet tea, regular sodas and alcoholic beverages, all of which has a lot of calories and sugar.  Up to 1 alcoholic drink daily may be beneficial for women (unless trying to lose weight, watch sugars).  Drink a lot of water.  Calcium recommendations are 1200-1500 mg daily (1500 mg for postmenopausal women or women without ovaries), and vitamin D 1000 IU daily.  This should be obtained from diet and/or supplements (vitamins), and calcium should not be taken all at once, but in divided doses.  Monthly self breast exams and yearly mammograms for women over the age of 56 is recommended.  Sunscreen of at least SPF 30 should be used on all sun-exposed parts of the skin when outside between the hours of 10 am and 4 pm (not just when at beach or pool, but even with exercise, golf, tennis, and yard work!)  Use a sunscreen that says "broad spectrum" so it covers both UVA and UVB rays, and make sure to reapply every 1-2 hours.  Remember to change the batteries in your smoke detectors when changing your clock times in the spring and fall.  Use your seat belt every time you are in a car, and please drive safely and not be distracted with cell phones and texting while driving.   Increase exercise.  Continue weight watchers/weight loss Continue metformin XR at current dose (one twice daily).  Try metamucil to bulk up stools.  Remember to  schedule your diabetic eye exam, and ask them to forward their notes to our office

## 2013-07-06 NOTE — Progress Notes (Signed)
Chief Complaint  Patient presents with  . Annual Exam    nonfasting annual exam(labs already done). Last pap 2011. Stopped taking 4 metformin daily as it was giving her diarrhea-would like to know if XR form of Metformin could alleviate that symptom.    Shannon Schultz is a 68 y.o. female who presents for a complete physical, and for follow-up on her chronic medical problems.  She had labs done prior to visit.    Diabetes follow-up: Blood sugars at home are running 115-130 in the morning.  Not checking other times of day. Denies hypoglycemia. Denies polydipsia and polyuria. Last eye exam was 04/2012, and everything was okay--received card that she is due but hasn't scheduled it yet. Patient follows a low sugar diet and checks feet regularly without concerns. Dry skin on feet. Denies numbness, tingling or sores/lesions. She decreased her Metformin dose about 3 months ago to 1 pill twice daily--she had been taking 2 pills twice daily, but having a lot of diarrhea and gas.  She was asking about changing to extended release metformin, as a friend told her she tolerated it better than regular (but she is already taking the extended release).  These symptoms improved some since cutting back the dose.  Still having some gassiness and occasional diarrhea, but much improved. She is exercising 15 minutes 3x/week. She is doing Weight Watchers, and has lost 17 pounds (through [employer]).  Hypertension follow-up: Blood pressures are not checked elsewhere. Denies dizziness, chest pain. Denies side effects of medications.   Hyperlipidemia follow-up: Patient is reportedly following a low-fat, low cholesterol diet. Compliant with medications and denies medication side effects.   COPD--since quitting smoking 05/2011 she really hasn't had any respiratory symptoms. Not needing inhaler at all, not even when allergies flare.  Her inhaler has expired, and would like to have one on hand just in case.  Immunization History   Administered Date(s) Administered  . Influenza Split 05/28/2011  . Influenza, High Dose Seasonal PF 04/07/2013  . Influenza, Seasonal, Injecte, Preservative Fre 06/07/2012  . Pneumococcal Conjugate-13 12/15/2004  . Pneumococcal Polysaccharide-23 05/28/2011  . Tdap 02/23/2008  . Zoster 05/17/2010   Last Pap smear: 05/2010 Last mammogram: 08/2012  Last colonoscopy: 08/2010  Last DEXA: 07/2010  Dentist: twice a year Ophtho: last year, due now (needs to schedule) Exercise: She has hand weights, and uses while walking in place for 15 minutes/day, 3 days/week.  [facility] [employer] [facility] her closed.  Past Medical History  Diagnosis Date  . Allergy   . Elevated cholesterol   . Hypertension   . Genital HSV     gets on hip  . Diverticulosis   . 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 Vitamin D deficiency disease   . Osteopenia   . Migraine   . Elevated liver enzymes     fatty liver per ultrasound per pt  . Impaired glucose tolerance   . Colon polyp   . Pneumonia 06/06/2011  . Type 2 diabetes mellitus   . HSV (herpes simplex virus) infection     on hip--on daily suppression  . Heart murmur      echo 05/2011 mild aortic stenosis    Past Surgical History  Procedure Laterality Date  . Mastectomy Right 1992    R breast for cancer  . Colonoscopy  2009, 08/2010    Dr. Collene Mares  . Cholecystectomy  2003  . Hernia repair  1062    umbilical    History   Social  History  . Marital Status: Married    Spouse Name: N/A    Number of Children: 3  . Years of Education: N/A   Occupational History  . Retired    Social History Main Topics  . Smoking status: Former Smoker -- 1.00 packs/day for 46 years    Types: Cigarettes    Quit date: 05/31/2011  . Smokeless tobacco: Never Used  . Alcohol Use: Yes     Comment: rarely, 1 drink per year maybe  . Drug Use: No  . Sexual Activity: Yes    Partners: Male    Birth Control/ Protection: Post-menopausal   Other Topics Concern  . Not on file   Social  History Narrative   Lives with her husband.  Children all live in [place] nearby. No pets    Family History  Problem Relation Age of Onset  . Heart disease Father   . Diabetes Father   . Hypertension Father   . Asthma Sister   . Allergies Sister   . Hyperlipidemia Sister   . Heart disease Mother     tachycardia  . Asthma Sister   . Allergies Sister   . Hyperlipidemia Sister   . Cancer Sister 72    breast cancer  . Breast cancer Sister 64  . Tuberculosis Maternal Grandfather   . Cancer Maternal Grandmother 32    stomach cancer  . Cancer Other     female cancers, bone cancer    Current outpatient prescriptions:aspirin EC 81 MG tablet, Take 81 mg by mouth daily.  , Disp: , Rfl: ;  BAYER MICROLET LANCETS lancets, Use as instructed, Disp: 100 each, Rfl: PRN;  bisoprolol-hydrochlorothiazide (ZIAC) 10-6.25 MG per tablet, TAKE 1 TABLET DAILY, Disp: 90 tablet, Rfl: 3;  Calcium Carbonate-Vitamin D (CALCIUM 600+D) 600-400 MG-UNIT per tablet, Take 2 tablets by mouth daily.  , Disp: , Rfl:  cetirizine (ZYRTEC) 10 MG tablet, Take 10 mg by mouth daily.  , Disp: , Rfl: ;  Cholecalciferol (VITAMIN D) 2000 UNITS tablet, Take 4,000 Units by mouth daily., Disp: , Rfl: ;  glucose blood (BAYER CONTOUR NEXT TEST) test strip, Use once daily, Disp: 100 each, Rfl: 3;  metFORMIN (GLUCOPHAGE-XR) 500 MG 24 hr tablet, Take 4 tablets (2,000 mg total) by mouth daily with breakfast., Disp: 360 tablet, Rfl: 1 Multiple Vitamins-Minerals (CENTRUM SILVER PO), Take 1 tablet by mouth.  , Disp: , Rfl: ;  simvastatin (ZOCOR) 20 MG tablet, Take 1 tablet (20 mg total) by mouth at bedtime., Disp: 90 tablet, Rfl: 1;  valACYclovir (VALTREX) 500 MG tablet, Take 500 mg by mouth daily., Disp: , Rfl: ;  albuterol (PROVENTIL HFA;VENTOLIN HFA) 108 (90 BASE) MCG/ACT inhaler, Inhale 2 puffs into the lungs every 6 (six) hours as needed for wheezing., Disp: 18 g, Rfl: 0  Allergies  Allergen Reactions  . Contrast Media [Iodinated Diagnostic  Agents] Other (See Comments)    Could not breath  . Iohexol Other (See Comments)    Immediately could not breathe  . Latex   . Levofloxacin Other (See Comments)    insomnia  . Sulfa Antibiotics Other (See Comments)    Historical from mother, pt states that mother says she almost died from this drug  . Codeine Nausea Only, Anxiety and Other (See Comments)    insomnia  . Lipitor [Atorvastatin Calcium] Rash   ROS: The patient denies anorexia, fever, vision changes, decreased hearing, ear pain, sore throat, breast concerns, chest pain, palpitations, dizziness, syncope, dyspnea on exertion, cough, swelling,  nausea, vomiting, constipation, abdominal pain, melena, hematochezia, indigestion/heartburn, hematuria, incontinence, dysuria, vaginal bleeding, discharge, odor or itch, genital lesions, joint pains, numbness (has some L-sided facial numbness x 5-10 mins a couple of times/wk--no other associated symptoms--no weakness, slurred speech, or associated with headache), tingling, weakness, tremor, suspicious skin lesions, depression, anxiety, abnormal bleeding/bruising, or enlarged lymph nodes. She has lost 15# since July, on Weight Watchers Some sinus/allergy headaches, itchy eyes. Not having migraines Some gassiness and diarrhea--wondering if related to metformin, improved since decreasing dose to 2/day  PHYSICAL EXAM: BP 120/64  Pulse 76  Ht 5\' 4"  (1.626 m)  Wt 224 lb (101.606 kg)  BMI 38.43 kg/m2  General Appearance:  Alert, cooperative, no distress, appears stated age   Head:  Normocephalic, without obvious abnormality, atraumatic   Eyes:  PERRL, conjunctiva/corneas clear, EOM's intact, fundi  benign   Ears:  Normal TM's and external ear canals   Nose:  Nares normal, mucosa normal, no drainage or sinus tenderness   Throat:  Lips, mucosa, and tongue normal; teeth and gums normal   Neck:  Supple, no lymphadenopathy; thyroid: no enlargement/tenderness/nodules; no carotid  bruit or JVD    Back:  Spine nontender, no curvature, ROM normal, no CVA tenderness   Lungs:  Clear to auscultation bilaterally without wheezes, rales or ronchi; respirations unlabored   Chest Wall:  No tenderness or deformity (other than absence of R breast, WHSS)   Heart:  Regular rate and rhythm, S1 and S2 normal, no rub  or gallop. 2-3/6 SEM loudest at RUSB, and radiates into R carotid   Breast Exam:  No tenderness, masses, or nipple discharge or inversion of L breast. No axillary lymphadenopathy. Right breast is absent.   Abdomen:  Soft, non-tender, nondistended, normoactive bowel sounds,  no masses, no hepatosplenomegaly. + abdominal obesity. Abdominal/ventral hernia, nontender  Genitalia:  Normal external genitalia without lesions. BUS and vagina normal; No cervical motion tenderness. Stenotic cervical os; no cervical lesions. No abnormal vaginal discharge. Uterus and adnexa not enlarged, nontender, no masses, although exam is limited by body habitus. Pap performed   Rectal:  Normal tone, no masses or tenderness; guaiac negative stool   Extremities:  Trace to 1+ pretibial edema. Normal diabetic foot exam   Pulses:  2+ and symmetric all extremities   Skin:  Skin color, texture, turgor normal, no rashes or lesions. Venous stasis changes bilateral lower legs with some prominent superficial veins in feet giving bluish discoloration. Feet are very dry.  Normal sensation to monofilament  Lymph nodes:  Cervical, supraclavicular, and axillary nodes normal   Neurologic:  CNII-XII intact, normal strength, sensation and gait; reflexes 2+ and symmetric throughout          Psych: Normal mood, affect, hygiene and grooming.    Lab Results  Component Value Date   HGBA1C 6.3* 07/01/2013     Chemistry      Component Value Date/Time   NA 139 07/01/2013 0833   K 4.8 07/01/2013 0833   CL 100 07/01/2013 0833   CO2 29 07/01/2013 0833   BUN 20 07/01/2013 0833   CREATININE 0.81 07/01/2013 0833   CREATININE 0.75 06/14/2011 0530       Component Value Date/Time   CALCIUM 9.7 07/01/2013 0833   ALKPHOS 56 07/01/2013 0833   AST 27 07/01/2013 0833   ALT 26 07/01/2013 0833   BILITOT 0.7 07/01/2013 0833     Glucose 131  Lab Results  Component Value Date   CHOL 131 07/01/2013   HDL  46 07/01/2013   LDLCALC 61 07/01/2013   TRIG 122 07/01/2013   CHOLHDL 2.8 07/01/2013   Normal microalbumin/Cr ratio  Lab Results  Component Value Date   TSH 2.636 07/01/2013   ASSESSMENT/PLAN:  Routine general medical examination at a health care facility - Plan: POCT Urinalysis Dipstick, Cytology - PAP   HTN (hypertension) - controlled  Pure hypercholesterolemia - at goal - Plan: simvastatin (ZOCOR) 20 MG tablet  Type II or unspecified type diabetes mellitus without mention of complication, not stated as uncontrolled - controlled - Plan: glucose blood (BAYER CONTOUR NEXT TEST) test strip, HM Diabetes Foot Exam  Transaminitis - resolved with weight loss  COPD (chronic obstructive pulmonary disease) - very mild.  refill MDI to have just in case (ie with URI) since hers is long expired - Plan: albuterol (PROVENTIL HFA;VENTOLIN HFA) 108 (90 BASE) MCG/ACT inhaler  Obesity (BMI 30-39.9) - congratulated on weight loss. encouraged increased exercise, and continuing Weight Watchers  Discussed monthly self breast exams and yearly mammograms after the age of 65; at least 30 minutes of aerobic activity at least 5 days/week; proper sunscreen use reviewed; healthy diet, including goals of calcium and vitamin D intake and alcohol recommendations (less than or equal to 1 drink/day) reviewed; regular seatbelt use; changing batteries in smoke detectors.  Immunization recommendations discussed, UTD.  Colonoscopy recommendations reviewed, UTD.  Pap smear today  Increase exercise.  Continue weight watchers/weight loss Continue metformin XR at current dose (one twice daily)--DM under better control and requiring less medication since weight loss.  Try metamucil  to bulk up stools.  She has living will and healthcare power of attorney. Full Code, full care  F/u 6 mos with labs prior ENTER ORDERS

## 2013-07-27 LAB — HM DIABETES EYE EXAM

## 2013-08-18 ENCOUNTER — Encounter: Payer: Self-pay | Admitting: Family Medicine

## 2013-08-23 ENCOUNTER — Other Ambulatory Visit (HOSPITAL_COMMUNITY): Payer: Self-pay | Admitting: Internal Medicine

## 2013-08-23 DIAGNOSIS — Z1231 Encounter for screening mammogram for malignant neoplasm of breast: Secondary | ICD-10-CM

## 2013-08-29 ENCOUNTER — Emergency Department (HOSPITAL_COMMUNITY): Payer: BC Managed Care – PPO

## 2013-08-29 ENCOUNTER — Encounter (HOSPITAL_COMMUNITY): Payer: Self-pay | Admitting: Emergency Medicine

## 2013-08-29 ENCOUNTER — Emergency Department (HOSPITAL_COMMUNITY)
Admission: EM | Admit: 2013-08-29 | Discharge: 2013-08-30 | Disposition: A | Discharge status: Home / Self Care | Payer: BC Managed Care – PPO | Attending: Emergency Medicine | Admitting: Emergency Medicine

## 2013-08-29 DIAGNOSIS — Z8679 Personal history of other diseases of the circulatory system: Secondary | ICD-10-CM | POA: Insufficient documentation

## 2013-08-29 DIAGNOSIS — J40 Bronchitis, not specified as acute or chronic: Secondary | ICD-10-CM

## 2013-08-29 DIAGNOSIS — E559 Vitamin D deficiency, unspecified: Secondary | ICD-10-CM | POA: Insufficient documentation

## 2013-08-29 DIAGNOSIS — J209 Acute bronchitis, unspecified: Secondary | ICD-10-CM | POA: Insufficient documentation

## 2013-08-29 DIAGNOSIS — Z8619 Personal history of other infectious and parasitic diseases: Secondary | ICD-10-CM | POA: Insufficient documentation

## 2013-08-29 DIAGNOSIS — E7800 Pure hypercholesterolemia, unspecified: Secondary | ICD-10-CM | POA: Insufficient documentation

## 2013-08-29 DIAGNOSIS — Z8719 Personal history of other diseases of the digestive system: Secondary | ICD-10-CM | POA: Insufficient documentation

## 2013-08-29 DIAGNOSIS — R011 Cardiac murmur, unspecified: Secondary | ICD-10-CM | POA: Insufficient documentation

## 2013-08-29 DIAGNOSIS — E119 Type 2 diabetes mellitus without complications: Secondary | ICD-10-CM | POA: Insufficient documentation

## 2013-08-29 DIAGNOSIS — Z91040 Latex allergy status: Secondary | ICD-10-CM | POA: Insufficient documentation

## 2013-08-29 DIAGNOSIS — I10 Essential (primary) hypertension: Secondary | ICD-10-CM | POA: Insufficient documentation

## 2013-08-29 DIAGNOSIS — Z79899 Other long term (current) drug therapy: Secondary | ICD-10-CM | POA: Insufficient documentation

## 2013-08-29 DIAGNOSIS — Z7982 Long term (current) use of aspirin: Secondary | ICD-10-CM | POA: Insufficient documentation

## 2013-08-29 DIAGNOSIS — M810 Age-related osteoporosis without current pathological fracture: Secondary | ICD-10-CM | POA: Insufficient documentation

## 2013-08-29 DIAGNOSIS — Z860100 Personal history of colon polyps, unspecified: Secondary | ICD-10-CM | POA: Insufficient documentation

## 2013-08-29 DIAGNOSIS — Z853 Personal history of malignant neoplasm of breast: Secondary | ICD-10-CM | POA: Insufficient documentation

## 2013-08-29 DIAGNOSIS — Z8701 Personal history of pneumonia (recurrent): Secondary | ICD-10-CM | POA: Insufficient documentation

## 2013-08-29 DIAGNOSIS — Z87891 Personal history of nicotine dependence: Secondary | ICD-10-CM | POA: Insufficient documentation

## 2013-08-29 NOTE — ED Notes (Signed)
Pt presents with c/o chills, cough, nasal congestion and fever, states has been wheezing with onset of Tuesday and worsening of symptoms being this evening. Pt assessed crackles in lower lungs together with mildly diminished lung sounds. No apparent signs of distress.

## 2013-08-29 NOTE — ED Provider Notes (Signed)
CSN: [id number]     Arrival date & time 08/29/13  2129 History   First MD Initiated Contact with Patient 08/29/13 2351     Chief Complaint  Patient presents with  . Chills  . Cough  . Nasal Congestion     (Consider location/radiation/quality/duration/timing/severity/associated sxs/prior Treatment) HPI  68 year old female with history of diabetes, hypertension, and remote history of breast cancer who presents with URI symptoms. Patient reports for the past week she has had a sensation of discomfort in her upper chest, which she described as "inflammation of my bronchial tubes".  However tonight she developed body shakes, chills, follow with increased shortness of breath, wheezing, and productive cough. Coughing has been persistent, no hemoptysis. She denies any fever, headache, neck stiffness, chest pain, back pain, abdominal pain, nausea vomiting and diarrhea. Did report some nasal congestion in mouth throat irritation for the past week. No recent hospitalization. Remote history of pneumonia. Patient is a former smoker. No recent sick contact. No specific treatment tried, except using her inhaler once, which provide some relief. States she does not normally use inhaler as she was told she has very mild COPD.    Past Medical History  Diagnosis Date  . Allergy   . Elevated cholesterol   . Hypertension   . Genital HSV     gets on hip  . Diverticulosis   . 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 Vitamin D deficiency disease   . Osteopenia   . Migraine   . Elevated liver enzymes     fatty liver per ultrasound per pt  . Impaired glucose tolerance   . Colon polyp   . Pneumonia 06/06/2011  . Type 2 diabetes mellitus   . HSV (herpes simplex virus) infection     on hip--on daily suppression  . Heart murmur      echo 05/2011 mild aortic stenosis   Past Surgical History  Procedure Laterality Date  . Mastectomy Right 1992    R breast for cancer  . Colonoscopy  2009, 08/2010    Dr. Collene Mares  .  Cholecystectomy  2003  . Hernia repair  2229    umbilical   Family History  Problem Relation Age of Onset  . Heart disease Father   . Diabetes Father   . Hypertension Father   . Asthma Sister   . Allergies Sister   . Hyperlipidemia Sister   . Heart disease Mother     tachycardia  . Asthma Sister   . Allergies Sister   . Hyperlipidemia Sister   . Cancer Sister 77    breast cancer  . Breast cancer Sister 14  . Tuberculosis Maternal Grandfather   . Cancer Maternal Grandmother 32    stomach cancer  . Cancer Other     female cancers, bone cancer   History  Substance Use Topics  . Smoking status: Former Smoker -- 1.00 packs/day for 46 years    Types: Cigarettes    Quit date: 05/31/2011  . Smokeless tobacco: Never Used  . Alcohol Use: Yes     Comment: rarely, 1 drink per year maybe   OB History   Grav Para Term Preterm Abortions TAB SAB Ect Mult Living   3 3        3      Review of Systems  All other systems reviewed and are negative.      Allergies  Contrast media; Iohexol; Latex; Levofloxacin; Sulfa antibiotics; Codeine; and Lipitor  Home Medications  Current Outpatient Rx  Name  Route  Sig  Dispense  Refill  . albuterol (PROVENTIL HFA;VENTOLIN HFA) 108 (90 BASE) MCG/ACT inhaler   Inhalation   Inhale 2 puffs into the lungs every 6 (six) hours as needed for wheezing.   18 g   0   . aspirin EC 81 MG tablet   Oral   Take 81 mg by mouth daily.           Marland Kitchen BAYER MICROLET LANCETS lancets      Use as instructed   100 each   PRN   . bisoprolol-hydrochlorothiazide (ZIAC) 10-6.25 MG per tablet      TAKE 1 TABLET DAILY   90 tablet   3   . Calcium Carbonate-Vitamin D (CALCIUM 600+D) 600-400 MG-UNIT per tablet   Oral   Take 2 tablets by mouth daily.           . cetirizine (ZYRTEC) 10 MG tablet   Oral   Take 10 mg by mouth daily.           . Cholecalciferol (VITAMIN D) 2000 UNITS tablet   Oral   Take 4,000 Units by mouth daily.         Marland Kitchen  glucose blood (BAYER CONTOUR NEXT TEST) test strip      Use once daily   100 each   3   . metFORMIN (GLUCOPHAGE-XR) 500 MG 24 hr tablet   Oral   Take 4 tablets (2,000 mg total) by mouth daily with breakfast.   360 tablet   1   . Multiple Vitamins-Minerals (CENTRUM SILVER PO)   Oral   Take 1 tablet by mouth.           . simvastatin (ZOCOR) 20 MG tablet   Oral   Take 1 tablet (20 mg total) by mouth at bedtime.   90 tablet   1   . valACYclovir (VALTREX) 500 MG tablet   Oral   Take 500 mg by mouth daily.          BP 151/72  Pulse 114  Temp(Src) 98.7 F (37.1 C) (Oral)  Resp 18  SpO2 94% Physical Exam  Nursing note and vitals reviewed. Constitutional: She appears well-developed and well-nourished. No distress.  Awake, alert, nontoxic appearance  HENT:  Head: Atraumatic.  Right Ear: External ear normal.  Left Ear: External ear normal.  Mouth/Throat: Oropharynx is clear and moist.  Eyes: Conjunctivae are normal. Right eye exhibits no discharge. Left eye exhibits no discharge.  Neck: Neck supple.  Cardiovascular: Normal rate and regular rhythm.   Pulmonary/Chest: Effort normal. No respiratory distress. She has no wheezes. She has no rales. She exhibits no tenderness.  Abdominal: Soft. There is no tenderness. There is no rebound.  Musculoskeletal: She exhibits no edema and no tenderness.  ROM appears intact, no obvious focal weakness  Neurological:  Mental status and motor strength appears intact  Skin: No rash noted.  Psychiatric: She has a normal mood and affect.    ED Course  Procedures (including critical care time)  12:23 AM Pt with sxs suggestive of bronchitis.  No active wheezing, mildly tachycardic but no fever.  No active respiratory distress.  Remote hx of Breast Cancer but no other significant risk factors for PE.  No active CP concerning for ACS.  Care discussed with Dr. Aline Brochure, who felt pt will benefit from tamiflu and zpak and discharge home.  Pt  is stable for discharge.  Will also refill albuterol nebs solution  as requested.  Return precaution discussed.  Pt otherwise well appearing, nontoxic.  Labs Review Labs Reviewed - No data to display Imaging Review Dg Chest 2 View (if Patient Has Fever And/or Copd)  08/29/2013   CLINICAL DATA:  Cough, chills, shortness of breath and wheezing.  EXAM: CHEST  2 VIEW  COMPARISON:  DG CHEST 2 VIEW dated 08/19/2011  FINDINGS: Cardiac silhouette is unremarkable and unchanged. Mildly calcified aortic knob, mediastinal silhouette is nonsuspicious. Mild chronic interstitial changes, no pleural effusions or focal consolidations. No pneumothorax.  Status post apparent right mastectomy with surgical clips projecting in the right chest wall. Mild degenerative change of thoracic spine and shoulders.  IMPRESSION: Mild chronic interstitial changes without superimposed acute cardiopulmonary process.   Electronically Signed   By: Elon Alas   On: 08/29/2013 23:05     EKG Interpretation None      MDM   Final diagnoses:  Bronchitis    BP 151/72  Pulse 114  Temp(Src) 98.7 F (37.1 C) (Oral)  Resp 18  SpO2 94%  I have reviewed nursing notes and vital signs. I personally reviewed the imaging tests through PACS system  I reviewed available ER/hospitalization records thought the EMR     Domenic Moras, PA-C 08/30/13 0025

## 2013-08-30 MED ORDER — AZITHROMYCIN 250 MG PO TABS
500.0000 mg | ORAL_TABLET | Freq: Once | ORAL | Status: AC
  Administered 2013-08-30: 500 mg via ORAL
  Filled 2013-08-30: qty 2

## 2013-08-30 MED ORDER — OSELTAMIVIR PHOSPHATE 75 MG PO CAPS
75.0000 mg | ORAL_CAPSULE | Freq: Every day | ORAL | Status: DC
  Filled 2013-08-30: qty 1

## 2013-08-30 MED ORDER — ALBUTEROL SULFATE (2.5 MG/3ML) 0.083% IN NEBU: 2.5000 mg | INHALATION_SOLUTION | RESPIRATORY_TRACT | Status: DC | PRN

## 2013-08-30 MED ORDER — AZITHROMYCIN 250 MG PO TABS: 250.0000 mg | ORAL_TABLET | Freq: Every day | ORAL | Status: DC

## 2013-08-30 NOTE — ED Notes (Signed)
Patient is alert and oriented x3.  She was given DC instructions and follow up visit instructions.  Patient gave verbal understanding. She was DC ambulatory under her own power to home.  V/S stable.  He was not showing any signs of distress on DC 

## 2013-08-30 NOTE — Discharge Instructions (Signed)

## 2013-08-30 NOTE — ED Provider Notes (Signed)
Medical screening examination/treatment/procedure(s) were conducted as a shared visit with non-physician practitioner(s) and myself.  I personally evaluated the patient during the encounter.   EKG Interpretation None      I interviewed and examined the patient. Lungs are CTAB. Cardiac exam wnl. Abdomen soft.  Pt mildly tachycardic, denies cp or sob. Sudden onset sx of chills, cough, felt tremulous this evening. She appears well on exam. Likely early viral syndrome. Will tx w/ tamiflu and abx for pna given age and comorbidities. Most recent Cr normal.   Blanchard Kelch, MD 08/30/13 1958

## 2013-09-16 ENCOUNTER — Ambulatory Visit (HOSPITAL_COMMUNITY): Payer: BC Managed Care – PPO

## 2013-09-23 ENCOUNTER — Ambulatory Visit (HOSPITAL_COMMUNITY)
Admission: RE | Admit: 2013-09-23 | Discharge: 2013-09-23 | Disposition: A | Discharge status: Home / Self Care | Payer: BC Managed Care – PPO | Source: Ambulatory Visit | Attending: Internal Medicine | Admitting: Internal Medicine

## 2013-09-23 DIAGNOSIS — Z1231 Encounter for screening mammogram for malignant neoplasm of breast: Secondary | ICD-10-CM | POA: Insufficient documentation

## 2013-12-28 ENCOUNTER — Other Ambulatory Visit: Payer: BC Managed Care – PPO

## 2014-01-04 ENCOUNTER — Encounter: Payer: BC Managed Care – PPO | Admitting: Family Medicine

## 2014-02-17 ENCOUNTER — Ambulatory Visit (HOSPITAL_BASED_OUTPATIENT_CLINIC_OR_DEPARTMENT_OTHER): Payer: BC Managed Care – PPO | Admitting: Genetic Counselor

## 2014-02-17 ENCOUNTER — Ambulatory Visit: Payer: BC Managed Care – PPO

## 2014-02-17 ENCOUNTER — Encounter: Payer: Self-pay | Admitting: Genetic Counselor

## 2014-02-17 DIAGNOSIS — Z803 Family history of malignant neoplasm of breast: Secondary | ICD-10-CM | POA: Insufficient documentation

## 2014-02-17 DIAGNOSIS — Z8481 Family history of carrier of genetic disease: Secondary | ICD-10-CM | POA: Insufficient documentation

## 2014-02-17 DIAGNOSIS — IMO0002 Reserved for concepts with insufficient information to code with codable children: Secondary | ICD-10-CM

## 2014-02-17 NOTE — Progress Notes (Signed)
Patient Name: Shannon Schultz Patient Age: 68 y.o. Encounter Date: 02/17/2014  Referring Physician: Lurline Del, MD  Primary Care Provider: No primary provider on file.   Ms. Shannon Schultz, a 68 y.o. female, is being seen at the [facility] due to a personal and family history of breast cancer and a pathogenic BRCA2 mutation in her family.  She presents to clinic today to discuss genetic testing for the familial mutation.  HISTORY OF PRESENT ILLNESS: Ms. Vanwagoner was diagnosed with right breast cancer (DCIS) at the age of 13. She is s/p right mastectomy.  We have no documentation today of the receptor status of the tumor.  Ms. Higham stated that she had a colonoscopy about 3 years ago which was negative for polyps.  Past Medical History  Diagnosis Date  . Allergy   . Elevated cholesterol   . Hypertension   . Genital HSV     gets on hip  . Diverticulosis   . 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 Vitamin D deficiency disease   . Osteopenia   . Migraine   . Elevated liver enzymes     fatty liver per ultrasound per pt  . Impaired glucose tolerance   . Colon polyp   . Pneumonia 06/06/2011  . Type 2 diabetes mellitus   . HSV (herpes simplex virus) infection     on hip--on daily suppression  . Heart murmur      echo 05/2011 mild aortic stenosis  . Family history of malignant neoplasm of breast   . FHx: BRCA2 gene positive     sister with BRCA2 mutation     Past Surgical History  Procedure Laterality Date  . Mastectomy Right 1992    R breast for cancer  . Colonoscopy  2009, 08/2010    Dr. Collene Mares  . Cholecystectomy  2003  . Hernia repair  5465    umbilical    History   Social History  . Marital Status: Married    Spouse Name: N/A    Number of Children: 3  . Years of Education: N/A   Occupational History  . Retired    Social History Main Topics  . Smoking status: Former Smoker -- 1.00 packs/day for 46 years    Types: Cigarettes    Quit date: 05/31/2011   . Smokeless tobacco: Never Used  . Alcohol Use: Yes     Comment: rarely, 1 drink per year maybe  . Drug Use: No  . Sexual Activity: No   Other Topics Concern  . Not on file   Social History Narrative   Lives with her husband.  Children all live in [place] nearby. No pets     FAMILY HISTORY:   During the visit, a 4-generation pedigree was reviewed from her sister's previous visit to this clinic. Ms. Belmares has 3 sons. She has 3 sisters and a brother. Her one sister had breast cancer initially diagnosed at 49 and was recently found to have a BRCA2 mutation called [id number]([id number]). Her maternal grandmother had cancer and died in her 55s, but it is unclear what the primary site was.  Ms. Bugbee ancestry is Caucsian. There is no known Jewish ancestry and no consanguinity.  ASSESSMENT AND PLAN: Ms. Roh is a 68 y.o. female with a personal and family history of breast cancer and a recently identified pathogenic BRCA2 mutation in her sister. We reviewed the characteristics, features and inheritance patterns of BRCA2, including the NCCN guidelines for screening  and management for those who are positive. We also discussed genetic testing, including the other appropriate family members to test, the process of testing, insurance coverage and implications of results.   Ms. Mans wished to pursue genetic testing and a blood sample will be sent to [facility] for analysis of for the specific BRCA2 mutation found in her sister [phone]). We discussed that given her history, she likely also has this mutation. Results should be available in approximately 2-3 weeks, at which point we will contact her and review implications for her as well as address genetic testing for at-risk family members.    We encouraged Ms. Morace to remain in contact with Cancer Genetics annually so that we can update the family history and inform her of  any changes in cancer genetics and testing that may be of benefit for this family. Ms.  Hargrove questions were answered to her satisfaction today.   Thank you for the referral and allowing [place] to share in the care of your patient.   The patient was seen for a total of 30 minutes, greater than 50% of which was spent face-to-face counseling. This patient was discussed with the overseeing provider who agrees with the above.   Steele Berg, MS, [place] Certified Genetic Counseor phone: [phone] [email or web address]

## 2014-03-21 ENCOUNTER — Encounter: Payer: Self-pay | Admitting: Genetic Counselor

## 2014-03-21 NOTE — Progress Notes (Signed)
GENETIC TEST RESULTS  Referring Physician: Lurline Del, MD    Ms. Milberger was called today to discuss genetic test results. Please see the Genetics note from her visit on 02/17/14 for a detailed discussion of her personal and family history.  GENETIC TESTING: We recommended Ms. Vanderzanden pursue testing for the familial mutation called BRCA2, [id number]. Ms. Osburn test, which was performed at [facility], did not reveal this mutation previously identified in her family, even though she has had breast cancer at a young age. Given this negative result, Ms. Griffey's chances of developing another breast cancer and ovarian cancer are the same as they are for any woman who has had breast cancer. Of note, the lab analyzed her sample twice to ensure accuracy. We discussed that her three sons are not at-risk of inheriting this mutation since she does not have it.  ADDITIONAL GENETIC TESTING: We discussed with Ms. Eugene that there are other genes that are associated with increased breast cancer risk that can be analyzed given that she did have breast cancer at a young age. Given her family history, Ms. Braun declined additional testing at this time, which is very reasonable. Should Ms. Mckimmy wish to pursue such testing in the future, we are happy to coordinate this at any time, but do not feel that she is at significant risk of harboring a mutation in a different gene.     CANCER SCREENING: We recommend Ms. Rady continue to follow the cancer screening guidelines provided by her physicians.   FAMILY MEMBERS: Even though Ms. Boyan did not inherit the familial gene mutation, others in the family may have. It is very important all of Ms. Meester's relatives (female and female) know of the presence of this mutation and that they may be at increased risk for cancer. Genetic testing can sort out who in your family is at risk and who is not. Please let us know if we can help facilitate  testing. Genetic counselors can be located in other cities, by visiting the website of the [employer] (ArtistMovie.se) and Field seismologist for a Dietitian by zip code.  Lastly, we discussed with Ms. Ghuman that cancer genetics is a rapidly advancing field and it is possible that new genetic tests will be appropriate for her in the future. We encouraged her to remain in contact with [place] on an annual basis so we can update her personal and family histories, and let her know of advances in cancer genetics that may benefit the family. Our contact number was provided. Ms. Cara questions were answered to her satisfaction today, and she knows she is welcome to call anytime with additional questions.    Steele Berg, MS, [place] Certified Genetic Counseor phone: [phone] [email or web address]

## 2014-04-14 ENCOUNTER — Other Ambulatory Visit: Payer: Self-pay

## 2014-05-01 ENCOUNTER — Encounter: Payer: Self-pay | Admitting: Genetic Counselor

## 2014-05-15 ENCOUNTER — Ambulatory Visit (INDEPENDENT_AMBULATORY_CARE_PROVIDER_SITE_OTHER): Payer: BC Managed Care – PPO | Admitting: Family Medicine

## 2014-05-15 ENCOUNTER — Encounter: Payer: Self-pay | Admitting: Family Medicine

## 2014-05-15 VITALS — BP 148/70 | HR 80 | Ht 64.0 in | Wt 235.0 lb

## 2014-05-15 DIAGNOSIS — Z23 Encounter for immunization: Secondary | ICD-10-CM

## 2014-05-15 DIAGNOSIS — E119 Type 2 diabetes mellitus without complications: Secondary | ICD-10-CM

## 2014-05-15 DIAGNOSIS — I10 Essential (primary) hypertension: Secondary | ICD-10-CM

## 2014-05-15 LAB — POCT GLYCOSYLATED HEMOGLOBIN (HGB A1C): Hemoglobin A1C: 6.3

## 2014-05-15 NOTE — Patient Instructions (Signed)
Please drop off copies of labs done by other physician.  Please try and check your blood pressure periodically (1-2x/week) and write down.  Bring list to your next visit.  Follow a low sodium diet, and exercise at least 30 minutes daily.  Both of these things will help your blood pressure.  It is recommended that you get at least 30 minutes of aerobic exercise at least 5 days/week (for weight loss, you may need as much as 60-90 minutes). This can be any activity that gets your heart rate up. This can be divided in 10-15 minute intervals if needed, but try and build up your endurance at least once a week.  Weight bearing exercise is also recommended twice weekly.  Low-Sodium Eating Plan Sodium raises blood pressure and causes water to be held in the body. Getting less sodium from food will help lower your blood pressure, reduce any swelling, and protect your heart, liver, and kidneys. We get sodium by adding salt (sodium chloride) to food. Most of our sodium comes from canned, boxed, and frozen foods. Restaurant foods, fast foods, and pizza are also very high in sodium. Even if you take medicine to lower your blood pressure or to reduce fluid in your body, getting less sodium from your food is important. WHAT IS MY PLAN? Most people should limit their sodium intake to 2,300 mg a day. Your health care provider recommends that you limit your sodium intake to __________ a day.  WHAT DO I NEED TO KNOW ABOUT THIS EATING PLAN? For the low-sodium eating plan, you will follow these general guidelines:  Choose foods with a % Daily Value for sodium of less than 5% (as listed on the food label).   Use salt-free seasonings or herbs instead of table salt or sea salt.   Check with your health care provider or pharmacist before using salt substitutes.   Eat fresh foods.  Eat more vegetables and fruits.  Limit canned vegetables. If you do use them, rinse them well to decrease the sodium.   Limit cheese  to 1 oz (28 g) per day.   Eat lower-sodium products, often labeled as "lower sodium" or "no salt added."  Avoid foods that contain monosodium glutamate (MSG). MSG is sometimes added to [place] food and some canned foods.  Check food labels (Nutrition Facts labels) on foods to learn how much sodium is in one serving.  Eat more home-cooked food and less restaurant, buffet, and fast food.  When eating at a restaurant, ask that your food be prepared with less salt or none, if possible.  HOW DO I READ FOOD LABELS FOR SODIUM INFORMATION? The Nutrition Facts label lists the amount of sodium in one serving of the food. If you eat more than one serving, you must multiply the listed amount of sodium by the number of servings. Food labels may also identify foods as:  Sodium free--Less than 5 mg in a serving.  Very low sodium--35 mg or less in a serving.  Low sodium--140 mg or less in a serving.  Light in sodium--50% less sodium in a serving. For example, if a food that usually has 300 mg of sodium is changed to become light in sodium, it will have 150 mg of sodium.  Reduced sodium--25% less sodium in a serving. For example, if a food that usually has 400 mg of sodium is changed to reduced sodium, it will have 300 mg of sodium. WHAT FOODS CAN I EAT? Grains Low-sodium cereals, including oats, puffed  wheat and rice, and shredded wheat cereals. Low-sodium crackers. Unsalted rice and pasta. Lower-sodium bread.  Vegetables Frozen or fresh vegetables. Low-sodium or reduced-sodium canned vegetables. Low-sodium or reduced-sodium tomato sauce and paste. Low-sodium or reduced-sodium tomato and vegetable juices.  Fruits Fresh, frozen, and canned fruit. Fruit juice.  Meat and Other Protein Products Low-sodium canned tuna and salmon. Fresh or frozen meat, poultry, seafood, and fish. Lamb. Unsalted nuts. Dried beans, peas, and lentils without added salt. Unsalted canned beans. Homemade soups without  salt. Eggs.  Dairy Milk. Soy milk. Ricotta cheese. Low-sodium or reduced-sodium cheeses. Yogurt.  Condiments Fresh and dried herbs and spices. Salt-free seasonings. Onion and garlic powders. Low-sodium varieties of mustard and ketchup. Lemon juice.  Fats and Oils Reduced-sodium salad dressings. Unsalted butter.  Other Unsalted popcorn and pretzels.  The items listed above may not be a complete list of recommended foods or beverages. Contact your dietitian for more options. WHAT FOODS ARE NOT RECOMMENDED? Grains Instant hot cereals. Bread stuffing, pancake, and biscuit mixes. Croutons. Seasoned rice or pasta mixes. Noodle soup cups. Boxed or frozen macaroni and cheese. Self-rising flour. Regular salted crackers. Vegetables Regular canned vegetables. Regular canned tomato sauce and paste. Regular tomato and vegetable juices. Frozen vegetables in sauces. Salted french fries. Olives. Angie Fava. Relishes. Sauerkraut. Salsa. Meat and Other Protein Products Salted, canned, smoked, spiced, or pickled meats, seafood, or fish. Bacon, ham, sausage, hot dogs, corned beef, chipped beef, and packaged luncheon meats. Salt pork. Jerky. Pickled herring. Anchovies, regular canned tuna, and sardines. Salted nuts. Dairy Processed cheese and cheese spreads. Cheese curds. Blue cheese and cottage cheese. Buttermilk.  Condiments Onion and garlic salt, seasoned salt, table salt, and sea salt. Canned and packaged gravies. Worcestershire sauce. Tartar sauce. Barbecue sauce. Teriyaki sauce. Soy sauce, including reduced sodium. Steak sauce. Fish sauce. Oyster sauce. Cocktail sauce. Horseradish. Regular ketchup and mustard. Meat flavorings and tenderizers. Bouillon cubes. Hot sauce. Tabasco sauce. Marinades. Taco seasonings. Relishes. Fats and Oils Regular salad dressings. Salted butter. Margarine. Ghee. Bacon fat.  Other Potato and tortilla chips. Corn chips and puffs. Salted popcorn and pretzels. Canned or  dried soups. Pizza. Frozen entrees and pot pies.  The items listed above may not be a complete list of foods and beverages to avoid. Contact your dietitian for more information. Document Released: 12/06/2001 Document Revised: 06/21/2013 Document Reviewed: 04/20/2013 [facility] Patient Information 2015 [place], [place]. This information is not intended to replace advice given to you by your health care provider. Make sure you discuss any questions you have with your health care provider.

## 2014-05-15 NOTE — Progress Notes (Signed)
Chief Complaint  Patient presents with  . Diabetes    nonfasting med check.    She had transferred to another physician (along with her sisters), but wasn't happy there, so it back to resume care.  She reportedly had A1c of 6.2 over 3 months ago (she thinks it was in July).  She had "everything" done, and everything was "excellent" (ie including lipids).  She was due to have A1c, c-met and lipids done in July here (future orders put in at her last visit here in 06/2013). She was put on lisinopril by this other physician, had allergic reaction. She recalls that her blood pressure was a little high (138?), and was also being given the lisinopril to help protect her kidneys.  She had rash and trouble breathing.  Tried it twice, and same thing occurred both times.  She had BrCa2 testing in August, due to her sister being +, and pt not having been tested (she had DCIS).  Diabetes follow-up: Blood sugars at home are running 120-130 in the morning. Not checking other times of day. Denies hypoglycemia. Denies polydipsia and polyuria. Last eye exam was Feb or March 2015. Patient follows a low sugar diet and checks feet regularly without concerns. Dry skin on feet, denies sores, lesions. Denies numbness, tingling.  She takes the metformin separated, rather than at the same time, as it caused some gassiness and occasional diarrhea when taken at the same time.   She quit going to [employer]--too busy dealing with her sister.  She hasn't been getting any regular exercise other than cleaning her house.  She regained 11 pounds of the weight she had lost since her last visit.  Hypertension follow-up: Blood pressures are not checked elsewhere. Denies headaches, dizziness, chest pain. Denies side effects of medications.   Hyperlipidemia follow-up: Patient is reportedly following a low-fat, low cholesterol diet. Compliant with medications and denies medication side effects.   Past Medical History  Diagnosis  Date  . Allergy   . Elevated cholesterol   . Hypertension   . Genital HSV     gets on hip  . Diverticulosis   . 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 Vitamin D deficiency disease   . Osteopenia   . Migraine   . Elevated liver enzymes     fatty liver per ultrasound per pt  . Impaired glucose tolerance   . Colon polyp   . Pneumonia 06/06/2011  . Type 2 diabetes mellitus   . HSV (herpes simplex virus) infection     on hip--on daily suppression  . Heart murmur      echo 05/2011 mild aortic stenosis  . Family history of malignant neoplasm of breast   . FHx: BRCA2 gene positive     sister with BRCA2 mutation (pt tested NEGATIVE)   Past Surgical History  Procedure Laterality Date  . Mastectomy Right 1992    R breast for cancer  . Colonoscopy  2009, 08/2010    Dr. Collene Mares  . Cholecystectomy  2003  . Hernia repair  4193    umbilical   History   Social History  . Marital Status: Married    Spouse Name: N/A    Number of Children: 3  . Years of Education: N/A   Occupational History  . Retired    Social History Main Topics  . Smoking status: Former Smoker -- 1.00 packs/day for 46 years    Types: Cigarettes    Quit date: 05/31/2011  .  Smokeless tobacco: Never Used  . Alcohol Use: Yes     Comment: rarely, 1 drink per year maybe  . Drug Use: No  . Sexual Activity: No   Other Topics Concern  . Not on file   Social History Narrative   Lives with her husband.  Children all live in [place] nearby. No pets   Outpatient Encounter Prescriptions as of 05/15/2014  Medication Sig Note  . aspirin EC 81 MG tablet Take 81 mg by mouth daily.     Marland Kitchen BAYER MICROLET LANCETS lancets Use as instructed   . bisoprolol-hydrochlorothiazide (ZIAC) 10-6.25 MG per tablet Take 1 tablet by mouth daily.   . Calcium Carbonate-Vitamin D (CALCIUM 600+D) 600-400 MG-UNIT per tablet Take 1 tablet by mouth daily.    . cetirizine (KLS ALLER-TEC) 10 MG tablet Take 10 mg by mouth daily.   . Cholecalciferol (VITAMIN  D) 2000 UNITS tablet Take 4,000 Units by mouth daily.   Marland Kitchen glucose blood (BAYER CONTOUR NEXT TEST) test strip Use once daily   . metFORMIN (GLUCOPHAGE-XR) 500 MG 24 hr tablet Take 500 mg by mouth 2 (two) times daily.   . Multiple Vitamins-Minerals (CENTRUM SILVER PO) Take 1 tablet by mouth.     . simvastatin (ZOCOR) 20 MG tablet Take 1 tablet (20 mg total) by mouth at bedtime.   . valACYclovir (VALTREX) 500 MG tablet Take 500 mg by mouth daily.   Marland Kitchen acetaminophen (TYLENOL) 325 MG tablet Take 650 mg by mouth every 6 (six) hours as needed for mild pain, fever or headache. 05/15/2014: Uses prn for hand arthritis pain  . albuterol (PROVENTIL HFA;VENTOLIN HFA) 108 (90 BASE) MCG/ACT inhaler Inhale 2 puffs into the lungs every 6 (six) hours as needed for wheezing. 05/15/2014: Hasn't need to use this in a while (twice over the summer)  . albuterol (PROVENTIL) (2.5 MG/3ML) 0.083% nebulizer solution Take 3 mLs (2.5 mg total) by nebulization every 4 (four) hours as needed for wheezing or shortness of breath.   . [DISCONTINUED] azithromycin (ZITHROMAX) 250 MG tablet Take 1 tablet (250 mg total) by mouth daily.    Allergies  Allergen Reactions  . Lisinopril Shortness Of Breath and Rash  . Contrast Media [Iodinated Diagnostic Agents] Other (See Comments)    Could not breath  . Iohexol Other (See Comments)    Immediately could not breathe  . Latex   . Levofloxacin Other (See Comments)    insomnia  . Sulfa Antibiotics Other (See Comments)    Historical from mother, pt states that mother says she almost died from this drug  . Codeine Nausea Only, Anxiety and Other (See Comments)    insomnia  . Lipitor [Atorvastatin Calcium] Rash   ROS: no fever, chills, UR symptoms, headaches, dizziness, cough, shortness of breath, nausea, vomiting, bowel changes, urinary complaints, bleeding, bruising, rash. Denies depression. +weight gain.   Pain in hands--saw hand doctor, and was told it was bad arthritis  PHYSICAL  EXAM: BP 158/90 mmHg  Pulse 80  Ht '5\' 4"'  (1.626 m)  Wt 235 lb (106.595 kg)  BMI 40.32 kg/m2 148/70 on repeat by MD Well developed, pleasant female in no distress HEENT: PERRL, EOMI, conjunctiva clear, OP clear Neck: no lymphadenopathy, thyromegaly or carotid bruit Heart: regular rate and rhythm with 2-3/6 SEM loudest at RUSB Lungs: clear bilaterally Abdomen: soft, obese.Nontender. No mass Extremities: Venous stasis changes with hyperpigmentation bilaterally in the distal lower legs. 2+ pulses. Trace pitting edema Skin: no rashes or lesions. Normal monofilament exam Psych: normal  mood, affect, hygiene and grooming Neuro: alert and oriented. Normal strength, sensation, gait. Cranial nerves grossly intact.   Lab Results  Component Value Date   HGBA1C 6.3 05/15/2014   ASSESSMENT/PLAN:  Diabetes mellitus without complication - controlled on current meds. Tolerates metformin ER when dose is split rather than taking 1091m together. Encouraged daily exercise, weight los - Plan: HgB A1c  Need for prophylactic vaccination and inoculation against influenza - Plan: Flu vaccine HIGH DOSE PF (Fluzone Tri High dose)  Essential hypertension - elevated today; allergic to ACEI. Low sodium diet, daily exercise, weight loss. Check BP's. Check labs from other provider--?if had microalbuminuria  Morbid obesity with body mass index of 40.0-49.9 - discussed smart food choices, portion control, daily exercise, and the need for weight loss  Please send/drop off copies of your bloodwork done elsewhere. ? If any labs due now.  ?if microalbumin done.  She needs Prevnar-13; elects to wait and get at another visit. Flu shot today (high dose)  Elevated BP--prefers trial of diet, exercise, weight loss rather than changing meds today.  Check BP elsewhere and return in 2 months with list.  F/u 2 months--fasting med check (f/u blood pressure, but also likely due for 6 month labs--await results from other  provider)  Will need CPE, but can schedule for 6 months from next visit, and give prevnar at her f/u rather than waiting for CPE.

## 2014-05-17 ENCOUNTER — Encounter: Payer: Self-pay | Admitting: *Deleted

## 2014-05-18 ENCOUNTER — Encounter: Payer: Self-pay | Admitting: Family Medicine

## 2014-05-19 ENCOUNTER — Encounter: Payer: Self-pay | Admitting: Internal Medicine

## 2014-07-06 ENCOUNTER — Encounter: Payer: Self-pay | Admitting: Family Medicine

## 2014-07-06 ENCOUNTER — Ambulatory Visit (INDEPENDENT_AMBULATORY_CARE_PROVIDER_SITE_OTHER): Payer: BLUE CROSS/BLUE SHIELD | Admitting: Family Medicine

## 2014-07-06 VITALS — BP 152/82 | HR 76 | Ht 64.0 in | Wt 236.0 lb

## 2014-07-06 DIAGNOSIS — E7800 Pure hypercholesterolemia, unspecified: Secondary | ICD-10-CM

## 2014-07-06 DIAGNOSIS — I10 Essential (primary) hypertension: Secondary | ICD-10-CM

## 2014-07-06 DIAGNOSIS — B009 Herpesviral infection, unspecified: Secondary | ICD-10-CM

## 2014-07-06 DIAGNOSIS — Z23 Encounter for immunization: Secondary | ICD-10-CM

## 2014-07-06 DIAGNOSIS — E119 Type 2 diabetes mellitus without complications: Secondary | ICD-10-CM

## 2014-07-06 MED ORDER — VALACYCLOVIR HCL 500 MG PO TABS: 500.0000 mg | ORAL_TABLET | Freq: Every day | ORAL | Status: DC

## 2014-07-06 MED ORDER — METFORMIN HCL ER 500 MG PO TB24: 500.0000 mg | ORAL_TABLET | Freq: Two times a day (BID) | ORAL | Status: DC

## 2014-07-06 MED ORDER — BISOPROLOL-HYDROCHLOROTHIAZIDE 10-6.25 MG PO TABS: 1.0000 | ORAL_TABLET | Freq: Every day | ORAL | Status: DC

## 2014-07-06 MED ORDER — SIMVASTATIN 20 MG PO TABS: 20.0000 mg | ORAL_TABLET | Freq: Every day | ORAL | Status: DC

## 2014-07-06 NOTE — Patient Instructions (Signed)
Join [employer] as planned. Continue low sodium diet. It is recommended that you get at least 30 minutes of aerobic exercise at least 5 days/week (for weight loss, you may need as much as 60-90 minutes). This can be any activity that gets your heart rate up. This can be divided in 10-15 minute intervals if needed, but try and build up your endurance at least once a week.  Weight bearing exercise is also recommended twice weekly.  Schedule nurse visit and bring your blood pressure monitor to have it checked for accuracy. Check BP's at home and record the date, columns for morning/evening, and a column for comments. Bring this list to your next appointment in 3 months. Also bring your list of blood sugars to your next appointment.  Check the sugar in the evening (at bedtime, or 2 hours after a meal) once a week

## 2014-07-06 NOTE — Progress Notes (Signed)
Chief Complaint  Patient presents with  . Hypertension    2 month fasting follow up.    Patient presents to f/u on her blood pressure.  At her November visit her BP was elevated. She preferred a trial of diet, exercise and weight loss, and to recheck in 2 months, rather than changing medications at last visit.   She has been trying to walk more--doesn't walk well due to bulging disk in her back.  She gave her recumbent bike to her brother, as she really didn't like using it.  She plans to rejoin [employer]--was successful in the past and realizes she needs the accountability.  Blood pressures at home (with new wrist monitor that hasn't been verified yet) since early December have been running 117-157/54-71.  Half are >135, 2 values were >140.  Denies headaches, dizziness.  Last labs were done through [facility] in 01/2014.  Results reviewed--normal c-met, lipid, CBC, TSH.  Notable for plt 122.  She is fasting for labs today.  Blood sugars have been running a little higher than normal--130's in the mornings Lab Results  Component Value Date   HGBA1C 6.3 05/15/2014   PMH, PSH, SH reviewed.  Outpatient Encounter Prescriptions as of 07/06/2014  Medication Sig Note  . aspirin EC 81 MG tablet Take 81 mg by mouth daily.     Marland Kitchen BAYER MICROLET LANCETS lancets Use as instructed   . bisoprolol-hydrochlorothiazide (ZIAC) 10-6.25 MG per tablet Take 1 tablet by mouth daily.   . Calcium Carbonate-Vitamin D (CALCIUM 600+D) 600-400 MG-UNIT per tablet Take 1 tablet by mouth daily.    . cetirizine (KLS ALLER-TEC) 10 MG tablet Take 10 mg by mouth daily.   . Cholecalciferol (VITAMIN D) 2000 UNITS tablet Take 4,000 Units by mouth daily.   Marland Kitchen glucose blood (BAYER CONTOUR NEXT TEST) test strip Use once daily   . metFORMIN (GLUCOPHAGE-XR) 500 MG 24 hr tablet Take 1 tablet (500 mg total) by mouth 2 (two) times daily.   . Multiple Vitamins-Minerals (CENTRUM SILVER PO) Take 1 tablet by mouth.     . simvastatin  (ZOCOR) 20 MG tablet Take 1 tablet (20 mg total) by mouth at bedtime.   . valACYclovir (VALTREX) 500 MG tablet Take 1 tablet (500 mg total) by mouth daily.   . [DISCONTINUED] bisoprolol-hydrochlorothiazide (ZIAC) 10-6.25 MG per tablet Take 1 tablet by mouth daily.   . [DISCONTINUED] metFORMIN (GLUCOPHAGE-XR) 500 MG 24 hr tablet Take 500 mg by mouth 2 (two) times daily.   . [DISCONTINUED] simvastatin (ZOCOR) 20 MG tablet Take 1 tablet (20 mg total) by mouth at bedtime.   . [DISCONTINUED] valACYclovir (VALTREX) 500 MG tablet Take 500 mg by mouth daily.   Marland Kitchen acetaminophen (TYLENOL) 325 MG tablet Take 650 mg by mouth every 6 (six) hours as needed for mild pain, fever or headache. 05/15/2014: Uses prn for hand arthritis pain  . albuterol (PROVENTIL HFA;VENTOLIN HFA) 108 (90 BASE) MCG/ACT inhaler Inhale 2 puffs into the lungs every 6 (six) hours as needed for wheezing. (Patient not taking: Reported on 07/06/2014) 05/15/2014: Hasn't need to use this in a while (twice over the summer)  . albuterol (PROVENTIL) (2.5 MG/3ML) 0.083% nebulizer solution Take 3 mLs (2.5 mg total) by nebulization every 4 (four) hours as needed for wheezing or shortness of breath. (Patient not taking: Reported on 07/06/2014)    Allergies  Allergen Reactions  . Lisinopril Shortness Of Breath and Rash  . Contrast Media [Iodinated Diagnostic Agents] Other (See Comments)  Could not breath  . Iohexol Other (See Comments)    Immediately could not breathe  . Latex   . Levofloxacin Other (See Comments)    insomnia  . Sulfa Antibiotics Other (See Comments)    Historical from mother, pt states that mother says she almost died from this drug  . Codeine Nausea Only, Anxiety and Other (See Comments)    insomnia  . Lipitor [Atorvastatin Calcium] Rash   ROS:  No fever, chills, chest pain, shortness of breath. She had recent URI (see phone calls), which lasted x 2 weeks and completely resolved.  Did not need to use albuterol.  Denies nausea,  vomiting, bowel changes, urinary complaints. +LBP from bulging disk. No numbness, tingling, weakness, moods are good.  See HPI  PHYSICAL EXAM: BP 152/82 mmHg  Pulse 76  Ht 5' 4" (1.626 m)  Wt 236 lb (107.049 kg)  BMI 40.49 kg/m2 154/72 on repeat by MD Pleasant, obese female in no distress HEENT: conjunctiva clear, PERRL.  OP normal Neck: no lymphadenopathy, thyromegaly or mass Heart: 3/6 SEM at RUSB.  Regular rate and rhythm Lungs: clear bilaterally Abdomen: soft, obese, nontender, no mass Extremities: 2+ pulse.  Trace edema bilaterally Psych: normal mood, affect, hygiene and grooming Neuro: alert and oriented. Cranial nerves grossly intact. Normal gait.  ASSESSMENT/PLAN:  Type 2 diabetes mellitus, controlled - slightly higher sugars recently.  reviewed diet, exercise,weight loss in detail.  continue same meds.  recheck A1c at next visit - Plan: metFORMIN (GLUCOPHAGE-XR) 500 MG 24 hr tablet, Comprehensive metabolic panel, Microalbumin / creatinine urine ratio  Need for pneumococcal vaccine - Plan: Pneumococcal conjugate vaccine 13-valent  Pure hypercholesterolemia - controlled per last labs; due again now. continue current meds, lowfat diet - Plan: simvastatin (ZOCOR) 20 MG tablet, Comprehensive metabolic panel, Lipid panel  Essential hypertension, benign - borderline  w/significant variability. She is now motivated to join [employer] and lose weight.  Therefore, cont same meds.  Keep record and bring monitor to f/u - Plan: bisoprolol-hydrochlorothiazide (ZIAC) 10-6.25 MG per tablet, Comprehensive metabolic panel  HSV (herpes simplex virus) infection - Plan: valACYclovir (VALTREX) 500 MG tablet  Counseled extensively re: exercise and diet.  35 min visit, more than 1/2 spent counseling (low sodium diet, healthy diet, portion control, exercise, risks of poorly controlled BP, DM).  Return in 3 mos--f/u BP, weight, A1c (nonfasting)

## 2014-07-07 LAB — COMPREHENSIVE METABOLIC PANEL
ALT: 37 U/L — ABNORMAL HIGH (ref 0–35)
AST: 42 U/L — ABNORMAL HIGH (ref 0–37)
Albumin: 4 g/dL (ref 3.5–5.2)
Alkaline Phosphatase: 52 U/L (ref 39–117)
BUN: 17 mg/dL (ref 6–23)
CO2: 30 mEq/L (ref 19–32)
Calcium: 9.5 mg/dL (ref 8.4–10.5)
Chloride: 101 mEq/L (ref 96–112)
Creat: 0.66 mg/dL (ref 0.50–1.10)
Glucose, Bld: 139 mg/dL — ABNORMAL HIGH (ref 70–99)
Potassium: 4.5 mEq/L (ref 3.5–5.3)
Sodium: 139 mEq/L (ref 135–145)
Total Bilirubin: 0.8 mg/dL (ref 0.2–1.2)
Total Protein: 6.7 g/dL (ref 6.0–8.3)

## 2014-07-07 LAB — LIPID PANEL
Cholesterol: 127 mg/dL (ref 0–200)
HDL: 49 mg/dL (ref >39)
LDL Cholesterol: 58 mg/dL (ref 0–99)
Total CHOL/HDL Ratio: 2.6 Ratio
Triglycerides: 101 mg/dL (ref <150)
VLDL: 20 mg/dL (ref 0–40)

## 2014-07-07 LAB — MICROALBUMIN / CREATININE URINE RATIO
Creatinine, Urine: 54.4 mg/dL
Microalb Creat Ratio: 20.2 mg/g (ref 0.0–30.0)
Microalb, Ur: 1.1 mg/dL (ref <2.0)

## 2014-07-12 ENCOUNTER — Other Ambulatory Visit: Payer: BLUE CROSS/BLUE SHIELD

## 2014-07-12 NOTE — Progress Notes (Signed)
BP CHECK BY A [facility] WRIST MACHINE = 148/70  BP CHECK BY A [employer] WRIST MACHINE = 141/ 64

## 2014-08-11 LAB — HM DIABETES EYE EXAM

## 2014-08-24 ENCOUNTER — Encounter: Payer: Self-pay | Admitting: Internal Medicine

## 2014-08-30 ENCOUNTER — Other Ambulatory Visit: Payer: Self-pay | Admitting: Family Medicine

## 2014-08-30 DIAGNOSIS — Z1231 Encounter for screening mammogram for malignant neoplasm of breast: Secondary | ICD-10-CM

## 2014-09-26 ENCOUNTER — Ambulatory Visit (HOSPITAL_COMMUNITY)
Admission: RE | Admit: 2014-09-26 | Discharge: 2014-09-26 | Disposition: A | Discharge status: Home / Self Care | Payer: BLUE CROSS/BLUE SHIELD | Source: Ambulatory Visit | Attending: Family Medicine | Admitting: Family Medicine

## 2014-09-26 DIAGNOSIS — Z1231 Encounter for screening mammogram for malignant neoplasm of breast: Secondary | ICD-10-CM | POA: Diagnosis not present

## 2014-10-05 ENCOUNTER — Encounter: Payer: Self-pay | Admitting: Family Medicine

## 2014-10-05 ENCOUNTER — Ambulatory Visit (INDEPENDENT_AMBULATORY_CARE_PROVIDER_SITE_OTHER): Payer: BLUE CROSS/BLUE SHIELD | Admitting: Family Medicine

## 2014-10-05 VITALS — BP 132/76 | HR 68 | Ht 64.0 in | Wt 242.0 lb

## 2014-10-05 DIAGNOSIS — R0600 Dyspnea, unspecified: Secondary | ICD-10-CM

## 2014-10-05 DIAGNOSIS — E119 Type 2 diabetes mellitus without complications: Secondary | ICD-10-CM

## 2014-10-05 DIAGNOSIS — I10 Essential (primary) hypertension: Secondary | ICD-10-CM | POA: Diagnosis not present

## 2014-10-05 DIAGNOSIS — R0609 Other forms of dyspnea: Secondary | ICD-10-CM | POA: Diagnosis not present

## 2014-10-05 DIAGNOSIS — J449 Chronic obstructive pulmonary disease, unspecified: Secondary | ICD-10-CM | POA: Diagnosis not present

## 2014-10-05 DIAGNOSIS — Z5181 Encounter for therapeutic drug level monitoring: Secondary | ICD-10-CM

## 2014-10-05 DIAGNOSIS — J439 Emphysema, unspecified: Secondary | ICD-10-CM | POA: Diagnosis not present

## 2014-10-05 LAB — POCT GLYCOSYLATED HEMOGLOBIN (HGB A1C): Hemoglobin A1C: 6.9

## 2014-10-05 MED ORDER — BUDESONIDE-FORMOTEROL FUMARATE 160-4.5 MCG/ACT IN AERO: 2.0000 | INHALATION_SPRAY | Freq: Two times a day (BID) | RESPIRATORY_TRACT | Status: DC

## 2014-10-05 MED ORDER — BAYER MICROLET LANCETS MISC: Status: DC

## 2014-10-05 MED ORDER — ALBUTEROL SULFATE (2.5 MG/3ML) 0.083% IN NEBU
2.5000 mg | INHALATION_SOLUTION | Freq: Once | RESPIRATORY_TRACT | Status: AC
  Administered 2014-10-05: 2.5 mg via RESPIRATORY_TRACT

## 2014-10-05 MED ORDER — GLUCOSE BLOOD VI STRP: ORAL_STRIP | Status: DC

## 2014-10-05 NOTE — Progress Notes (Signed)
Chief Complaint  Patient presents with  . Follow-up    on bp, weight and diabetes.   Patient presents for 3 month follow up on her blood pressure and sugars.  Her sugars and BP had been up at her last visit. She at that time was going to join [employer], lose weight and f/u today, hoping to see weight loss, lower sugars and bloos pressures.  Unfortunately, she did not join [employer]--they closed the one near to her house.  She gained 6 pounds since her last visit.  She brings in list of BP's:  120-142/61-78, mostly ranging 128-136/65-70  She didn't bring list of sugars but states they have been running 150-160's in the morning. This morning was 141.  Her exercise has been limited by shortness of breath; she gets winded just going to the mailbox and back.   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  She worries a lot about her grandson, and everything in general. Tearful during her visit.  Denies depression.  COPD: She states she was diagnosed with mild COPD.  Last PFT was in 2013.  She has an inhaler, but uses it infrequently. She used it some this week because her allergies are flaring, feels some wheezing.  Past Medical History  Diagnosis Date  . Allergy   . Elevated cholesterol   . Hypertension   . Genital HSV     gets on hip  . Diverticulosis   . 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 Vitamin D deficiency disease   . Osteopenia   . Migraine   . Elevated liver enzymes     fatty liver per ultrasound per pt  . Impaired glucose tolerance   . Colon polyp   . Pneumonia 06/06/2011  . Type 2 diabetes mellitus   . HSV (herpes simplex virus) infection     on hip--on daily suppression  . Heart murmur      echo 05/2011 mild aortic stenosis  . Family history of malignant neoplasm of breast   . FHx: BRCA2 gene positive     sister with BRCA2 mutation (pt tested NEGATIVE)   Past Surgical History  Procedure Laterality Date  . Mastectomy Right 1992    R breast for cancer  . Colonoscopy  2009, 08/2010    Dr.  Collene Mares  . Cholecystectomy  2003  . Hernia repair  1017    umbilical   History   Social History  . Marital Status: Married    Spouse Name: N/A  . Number of Children: 3  . Years of Education: N/A   Occupational History  . Retired    Social History Main Topics  . Smoking status: Former Smoker -- 1.00 packs/day for 46 years    Types: Cigarettes    Quit date: 05/31/2011  . Smokeless tobacco: Never Used  . Alcohol Use: Yes     Comment: rarely, 1 drink per year maybe  . Drug Use: No  . Sexual Activity: No   Other Topics Concern  . Not on file   Social History Narrative   Lives with her husband.  Children all live in [place] nearby. No pets   Outpatient Encounter Prescriptions as of 10/05/2014  Medication Sig Note  . aspirin EC 81 MG tablet Take 81 mg by mouth daily.     Marland Kitchen BAYER MICROLET LANCETS lancets Use as instructed   . bisoprolol-hydrochlorothiazide (ZIAC) 10-6.25 MG per tablet Take 1 tablet by mouth daily.   . Calcium Carbonate-Vitamin D (CALCIUM  600+D) 600-400 MG-UNIT per tablet Take 1 tablet by mouth daily.    . cetirizine (KLS ALLER-TEC) 10 MG tablet Take 10 mg by mouth daily.   . Cholecalciferol (VITAMIN D) 2000 UNITS tablet Take 4,000 Units by mouth daily.   Marland Kitchen glucose blood (BAYER CONTOUR NEXT TEST) test strip Use once daily   . metFORMIN (GLUCOPHAGE-XR) 500 MG 24 hr tablet Take 1 tablet (500 mg total) by mouth 2 (two) times daily.   . Multiple Vitamins-Minerals (CENTRUM SILVER PO) Take 1 tablet by mouth.     . Probiotic Product (PHILLIPS COLON HEALTH PO) Take 1 capsule by mouth daily.   . simvastatin (ZOCOR) 20 MG tablet Take 1 tablet (20 mg total) by mouth at bedtime.   . valACYclovir (VALTREX) 500 MG tablet Take 1 tablet (500 mg total) by mouth daily.   . [DISCONTINUED] BAYER MICROLET LANCETS lancets Use as instructed   . [DISCONTINUED] glucose blood (BAYER CONTOUR NEXT TEST) test strip Use once daily   . acetaminophen (TYLENOL) 325 MG tablet Take 650 mg by mouth every 6  (six) hours as needed for mild pain, fever or headache. 05/15/2014: Uses prn for hand arthritis pain  . albuterol (PROVENTIL HFA;VENTOLIN HFA) 108 (90 BASE) MCG/ACT inhaler Inhale 2 puffs into the lungs every 6 (six) hours as needed for wheezing. (Patient not taking: Reported on 07/06/2014) 05/15/2014: Hasn't need to use this in a while (twice over the summer)  . albuterol (PROVENTIL) (2.5 MG/3ML) 0.083% nebulizer solution Take 3 mLs (2.5 mg total) by nebulization every 4 (four) hours as needed for wheezing or shortness of breath. (Patient not taking: Reported on 07/06/2014)    Allergies  Allergen Reactions  . Lisinopril Shortness Of Breath and Rash  . Contrast Media [Iodinated Diagnostic Agents] Other (See Comments)    Could not breath  . Iohexol Other (See Comments)    Immediately could not breathe  . Latex   . Levofloxacin Other (See Comments)    insomnia  . Sulfa Antibiotics Other (See Comments)    Historical from mother, pt states that mother says she almost died from this drug  . Codeine Nausea Only, Anxiety and Other (See Comments)    insomnia  . Lipitor [Atorvastatin Calcium] Rash   ROS:  No fever, chills, headaches, dizziness, chest pain, palpitations, nausea, vomiting, bowel changes, urinary complaints, bleeding, bruises, rashes.  +anxiety, denies depression. +shortness of breath.  No URI complaints, just some mild allergies recently.  PHYSICAL EXAM: BP 132/76 mmHg  Pulse 68  Ht '5\' 4"'  (1.626 m)  Wt 242 lb (109.77 kg)  BMI 41.52 kg/m2 Well developed, pleasant, obese female, intermittently tearful, in reporting her failure to meet her goals. Neck: no lymphadenopathy Heart: 3/6 SEM, unchanged, loudest at RUSB.  Regular rate and rhythm Lungs: Distant breath sounds. No wheezes, rales, ronchi Extremities: no edema  Lab Results  Component Value Date   HGBA1C 6.9 10/05/2014   A1c is up to 6.9 from 6.3 in November  PFT's--severe obstruction with improvement noted after  nebulizer.  ASSESSMENT/PLAN:  Diabetes mellitus without complication - increased A1c (still <7) and weight. will not need med changes if can lose weight, cut carbs. discussed meds other than increasing metformin (due to GI SE) - Plan: HgB A1c, glucose blood (BAYER CONTOUR NEXT TEST) test strip, BAYER MICROLET LANCETS lancets  Pulmonary emphysema, unspecified emphysema type - start Symbicort. instructed on proper use.  - Plan: Spirometry with Graph, Spirometry with Graph, budesonide-formoterol (SYMBICORT) 160-4.5 MCG/ACT inhaler, albuterol (PROVENTIL) (2.5 MG/3ML)  0.083% nebulizer solution 2.5 mg  Severe obesity (BMI >= 40) - counseled re: weight/anxiety. consider Overeaters Anon. consider Wellbutrin or celexa to treat anxiety/cravings. Declines for now. wants to look at [email or web address]  Dyspnea on exertion - most likely related to her COPD.  If not improved, must also r/o cardiac etiology. Pt will call with update within 2 weeks  Essential hypertension, benign - BPs improved.  continue current medication. Hopefully she can start daily exercise as COPD improves   Start Symbicorrt 160/4.5.  Sample (60 inhalations) given and copay card, and rx sent Start presumptive treatment for COPD If DOE is not improving within 1-2 weeks, call for cardiology referral.  DOE is concerning for possible underlying CAD.  Consider trial of Wellbutrin or citalopram to treat the anxiety such that you don't have as much emotional eating, which will help your sugars and help with weight loss eventually. I also recommend looking into Overeaters Anonymous. (OA) as support.  Feel free to look at [email or web address] as well.  Since we discussed the side effects, feel free to call if/when you change your mind or are ready to start the anxiety medication, and let me know which one.  Let's try treating the COPD to see if your shortness of breath improves. If it doesn't, then we will need to refer you to cardiologist.  If sugars remain  high, we can either increase the metformin, or add a second drug.  Medications like Wilder Glade and Anastasio Auerbach will treat diabetes and help lose weight.  Declines increasing metformin at this time (due to h/o diarrhea).  Will need to adjust meds in 3 mos if A1c is over 7.   F/u as scheduled 7/13. A1c, lipid, c-met, TSH prior to visit. Orders entered

## 2014-10-05 NOTE — Patient Instructions (Addendum)
Consider trial of Wellbutrin or citalopram to treat the anxiety such that you don't have as much emotional eating, which will help your sugars and help with weight loss eventually. I also recommend looking into Overeaters Anonymous. (OA) as support.  Feel free to look at [email or web address] as well.  Since we discussed the side effects, feel free to call if/when you change your mind or are ready to start the anxiety medication, and let me know which one.  If sugars remain high, we can either increase the metformin, or add a second drug.  Medications like Wilder Glade and Anastasio Auerbach will treat diabetes and help lose weight.   Let's try treating the COPD to see if your shortness of breath improves. If it doesn't, then we will need to refer you to cardiologist. Use the Symbicort 2 puffs twice daily.  Rinse your mouth out afterwards. Call if you are having problems with this medication.

## 2014-10-06 HISTORY — DX: Morbid (severe) obesity due to excess calories: E66.01

## 2014-12-18 ENCOUNTER — Other Ambulatory Visit: Payer: Self-pay | Admitting: Family Medicine

## 2014-12-18 NOTE — Telephone Encounter (Signed)
Is this okay to refill? 

## 2015-01-03 ENCOUNTER — Other Ambulatory Visit: Payer: Self-pay | Admitting: Family Medicine

## 2015-01-04 ENCOUNTER — Other Ambulatory Visit: Payer: BLUE CROSS/BLUE SHIELD

## 2015-01-04 DIAGNOSIS — E119 Type 2 diabetes mellitus without complications: Secondary | ICD-10-CM

## 2015-01-04 DIAGNOSIS — I10 Essential (primary) hypertension: Secondary | ICD-10-CM

## 2015-01-04 DIAGNOSIS — Z5181 Encounter for therapeutic drug level monitoring: Secondary | ICD-10-CM

## 2015-01-04 LAB — HEMOGLOBIN A1C
Hgb A1c MFr Bld: 6.7 % — ABNORMAL HIGH (ref <5.7)
Mean Plasma Glucose: 146 mg/dL — ABNORMAL HIGH (ref <117)

## 2015-01-04 LAB — COMPREHENSIVE METABOLIC PANEL
ALT: 36 U/L — ABNORMAL HIGH (ref 0–35)
AST: 43 U/L — ABNORMAL HIGH (ref 0–37)
Albumin: 4 g/dL (ref 3.5–5.2)
Alkaline Phosphatase: 54 U/L (ref 39–117)
BUN: 16 mg/dL (ref 6–23)
CO2: 30 mEq/L (ref 19–32)
Calcium: 9.4 mg/dL (ref 8.4–10.5)
Chloride: 100 mEq/L (ref 96–112)
Creat: 0.79 mg/dL (ref 0.50–1.10)
Glucose, Bld: 143 mg/dL — ABNORMAL HIGH (ref 70–99)
Potassium: 4.9 mEq/L (ref 3.5–5.3)
Sodium: 140 mEq/L (ref 135–145)
Total Bilirubin: 0.8 mg/dL (ref 0.2–1.2)
Total Protein: 6.6 g/dL (ref 6.0–8.3)

## 2015-01-04 LAB — LIPID PANEL
Cholesterol: 110 mg/dL (ref 0–200)
HDL: 44 mg/dL — ABNORMAL LOW (ref >=46)
LDL Cholesterol: 47 mg/dL (ref 0–99)
Total CHOL/HDL Ratio: 2.5 Ratio
Triglycerides: 96 mg/dL (ref <150)
VLDL: 19 mg/dL (ref 0–40)

## 2015-01-04 LAB — TSH: TSH: 3.13 u[IU]/mL (ref 0.350–4.500)

## 2015-01-05 ENCOUNTER — Other Ambulatory Visit: Payer: BLUE CROSS/BLUE SHIELD

## 2015-01-10 ENCOUNTER — Encounter: Payer: Self-pay | Admitting: Family Medicine

## 2015-01-10 ENCOUNTER — Ambulatory Visit (INDEPENDENT_AMBULATORY_CARE_PROVIDER_SITE_OTHER): Payer: BLUE CROSS/BLUE SHIELD | Admitting: Family Medicine

## 2015-01-10 VITALS — BP 130/86 | HR 72 | Ht 64.0 in | Wt 240.0 lb

## 2015-01-10 DIAGNOSIS — M8580 Other specified disorders of bone density and structure, unspecified site: Secondary | ICD-10-CM | POA: Diagnosis not present

## 2015-01-10 DIAGNOSIS — E119 Type 2 diabetes mellitus without complications: Secondary | ICD-10-CM

## 2015-01-10 DIAGNOSIS — E7800 Pure hypercholesterolemia, unspecified: Secondary | ICD-10-CM

## 2015-01-10 DIAGNOSIS — Z0000 Encounter for general adult medical examination without abnormal findings: Secondary | ICD-10-CM | POA: Diagnosis not present

## 2015-01-10 DIAGNOSIS — R945 Abnormal results of liver function studies: Secondary | ICD-10-CM

## 2015-01-10 DIAGNOSIS — I10 Essential (primary) hypertension: Secondary | ICD-10-CM

## 2015-01-10 DIAGNOSIS — R7989 Other specified abnormal findings of blood chemistry: Secondary | ICD-10-CM | POA: Diagnosis not present

## 2015-01-10 DIAGNOSIS — R0600 Dyspnea, unspecified: Secondary | ICD-10-CM

## 2015-01-10 DIAGNOSIS — R0609 Other forms of dyspnea: Secondary | ICD-10-CM | POA: Diagnosis not present

## 2015-01-10 LAB — POCT URINALYSIS DIPSTICK
Bilirubin, UA: NEGATIVE
Blood, UA: NEGATIVE
Glucose, UA: NEGATIVE
Ketones, UA: NEGATIVE
Leukocytes, UA: NEGATIVE
Nitrite, UA: NEGATIVE
Protein, UA: NEGATIVE
Spec Grav, UA: 1.015
Urobilinogen, UA: NEGATIVE
pH, UA: 6

## 2015-01-10 MED ORDER — METFORMIN HCL ER 500 MG PO TB24: 500.0000 mg | ORAL_TABLET | Freq: Two times a day (BID) | ORAL | Status: DC

## 2015-01-10 MED ORDER — SIMVASTATIN 20 MG PO TABS: 20.0000 mg | ORAL_TABLET | Freq: Every day | ORAL | Status: DC

## 2015-01-10 MED ORDER — BISOPROLOL-HYDROCHLOROTHIAZIDE 10-6.25 MG PO TABS: 1.0000 | ORAL_TABLET | Freq: Every day | ORAL | Status: DC

## 2015-01-10 NOTE — Progress Notes (Signed)
Chief Complaint  Patient presents with  . Annual Exam    fasting annual exam with pelvic. No concerns. Did stop using the Symbicort after sample ran out because she did not think it was helping with SOB.     Shannon Schultz is a 69 y.o. female who presents for a complete physical.  She has the following concerns:  Hypertension:  Blood pressures at home (with husband's arm monitor) 120's/60-70. Denies dizziness, chest pain. Denies side effects of medications.   Diabetes: Blood sugars at home are running around 140. Not checking other times of day. Denies hypoglycemia. Denies polydipsia and polyuria. Last eye exam was February.  She had normal visual field. She saw eye doctor again last week with some blotches in her vision, but everything was okay, vision has resolved. Patient follows a low sugar diet and checks feet regularly without concerns.  Denies numbness, tingling or sores/lesions.  Hyperlipidemia follow-up: Patient is reportedly following a low-fat, low cholesterol diet. Compliant with medications and denies medication side effects.   COPD:  She was started on Symbicort at last visit 3 months ago when she had complaints of dyspnea. She doesn't think that the Symbicort helped at all (she only used the sample, never filled the prescription).  She did not let us know she wasn't any better (had discussed contacting us within 2 weeks if dyspnea persisted, for referral to cardiologist). She gets short of breath with walking to mailbox and back (50 feet, on the way back is up hill), going upstairs, carrying laundry basket. She quit smoking 05/2011. Had PFT's in April (see report; abnormal, with improvement after BD treatment)  Obesity:  She looked into [email or web address], can't recall why she didn't follow up on this (remembers looking at it).  We had discussed treating her anxiety/moods (wellbutrin or celexa) given that she reports being an emotional eater, and overeaters anonymous had also been suggested.  She previously lost weight on Weight Watchers, but they closed the [facility] near her.  Immunization History  Administered Date(s) Administered  . Influenza Split 05/28/2011  . Influenza, High Dose Seasonal PF 04/07/2013, 05/15/2014  . Influenza, Seasonal, Injecte, Preservative Fre 06/07/2012  . Pneumococcal Conjugate-13 07/06/2014  . Pneumococcal Polysaccharide-23 12/15/2004, 05/28/2011  . Tdap 02/23/2008  . Zoster 05/17/2010   Last Pap smear:  06/2013 Last mammogram: 08/2014  Last colonoscopy: 08/2010  Last DEXA: 07/2010  Dentist: twice a year Ophtho: yearly, 07/2014 (and recent for acute problem) Exercise: She walks back and forth in her house a couple of times, 10 minutes at a time, every day.  She has hand weights that she uses.  She doesn't think she has Living Will or HCPOA. Negative fall and depression screen. Function status screen notable only for dyspnea on stairs.  Dermatologist--can't remember name, hasn't gone in a while Dentist--(with Dr. Radford Pax, Dr. Aggie Moats, who retired) [facility]--Dr. Collene Mares  Past Medical History  Diagnosis Date  . Allergy   . Elevated cholesterol   . Hypertension   . Genital HSV     gets on hip  . Diverticulosis   . 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 Vitamin D deficiency disease   . Osteopenia   . Migraine   . Elevated liver enzymes     fatty liver per ultrasound per pt  . Impaired glucose tolerance   . Colon polyp   . Pneumonia 06/06/2011  . Type 2 diabetes mellitus   . HSV (herpes simplex virus) infection  on hip--on daily suppression  . Heart murmur      echo 05/2011 mild aortic stenosis  . Family history of malignant neoplasm of breast   . FHx: BRCA2 gene positive     sister with BRCA2 mutation (pt tested NEGATIVE)    Past Surgical History  Procedure Laterality Date  . Mastectomy Right 1992    R breast for cancer  . Colonoscopy  2009, 08/2010    Dr. Collene Mares  . Cholecystectomy  2003  . Hernia repair   1601    umbilical    History   Social History  . Marital Status: Married    Spouse Name: N/A  . Number of Children: 3  . Years of Education: N/A   Occupational History  . Retired    Social History Main Topics  . Smoking status: Former Smoker -- 1.00 packs/day for 46 years    Types: Cigarettes    Quit date: 05/31/2011  . Smokeless tobacco: Never Used  . Alcohol Use: 0.0 oz/week    0 Standard drinks or equivalent per week     Comment: rarely, 1 drink per year maybe  . Drug Use: No  . Sexual Activity:    Partners: Male    Birth Control/ Protection: Post-menopausal   Other Topics Concern  . Not on file   Social History Narrative   Lives with her husband.  Children all live in [place] nearby. No pets. 5 grandchildren    Family History  Problem Relation Age of Onset  . Heart disease Father   . Diabetes Father   . Hypertension Father   . Asthma Sister   . Allergies Sister   . Hyperlipidemia Sister   . Heart disease Mother     tachycardia  . Asthma Sister   . Allergies Sister   . Hyperlipidemia Sister   . Breast cancer Sister 24    BRCA2 positive; metastatic to bones at age 66  . Tuberculosis Maternal Grandfather   . Cancer Maternal Grandmother     deceased 68; unk. primary; possibly stomach  . Cancer Other     female cancers, bone cancer  . Stroke Paternal Grandmother 44    died of cerebral hemorrhage    Outpatient Encounter Prescriptions as of 01/10/2015  Medication Sig Note  . acetaminophen (TYLENOL) 325 MG tablet Take 650 mg by mouth every 6 (six) hours as needed for mild pain, fever or headache. 05/15/2014: Uses prn for hand arthritis pain  . aspirin EC 81 MG tablet Take 81 mg by mouth daily.     Marland Kitchen BAYER MICROLET LANCETS lancets Use as instructed   . bisoprolol-hydrochlorothiazide (ZIAC) 10-6.25 MG per tablet Take 1 tablet by mouth daily.   . Calcium Carbonate-Vitamin D (CALCIUM 600+D) 600-400 MG-UNIT per tablet Take 1 tablet by mouth daily.    . cetirizine  (KLS ALLER-TEC) 10 MG tablet Take 10 mg by mouth daily.   . Cholecalciferol (VITAMIN D) 2000 UNITS tablet Take 4,000 Units by mouth daily.   Marland Kitchen glucose blood (BAYER CONTOUR NEXT TEST) test strip Use once daily   . metFORMIN (GLUCOPHAGE-XR) 500 MG 24 hr tablet Take 1 tablet (500 mg total) by mouth 2 (two) times daily.   . Multiple Vitamins-Minerals (CENTRUM SILVER PO) Take 1 tablet by mouth.     Marland Kitchen PROAIR HFA 108 (90 BASE) MCG/ACT inhaler INHALE 2 PUFFS INTO THE LUNGS EVERY 6 (SIX) HOURS AS NEEDED FOR WHEEZING. 01/10/2015: Uses prn (related to poor air quality in the summer, used 3x  this summer)  . Probiotic Product (PHILLIPS COLON HEALTH PO) Take 1 capsule by mouth daily.   . simvastatin (ZOCOR) 20 MG tablet Take 1 tablet (20 mg total) by mouth at bedtime.   . valACYclovir (VALTREX) 500 MG tablet Take 1 tablet (500 mg total) by mouth daily.   . [DISCONTINUED] bisoprolol-hydrochlorothiazide (ZIAC) 10-6.25 MG per tablet Take 1 tablet by mouth daily.   . [DISCONTINUED] metFORMIN (GLUCOPHAGE-XR) 500 MG 24 hr tablet Take 1 tablet (500 mg total) by mouth 2 (two) times daily.   . [DISCONTINUED] simvastatin (ZOCOR) 20 MG tablet Take 1 tablet (20 mg total) by mouth at bedtime.   Marland Kitchen albuterol (PROVENTIL) (2.5 MG/3ML) 0.083% nebulizer solution Take 3 mLs (2.5 mg total) by nebulization every 4 (four) hours as needed for wheezing or shortness of breath. (Patient not taking: Reported on 07/06/2014)   . budesonide-formoterol (SYMBICORT) 160-4.5 MCG/ACT inhaler Inhale 2 puffs into the lungs 2 (two) times daily. (Patient not taking: Reported on 01/10/2015) 01/10/2015: Only used samples, didn't help so stopped   No facility-administered encounter medications on file as of 01/10/2015.    Allergies  Allergen Reactions  . Lisinopril Shortness Of Breath and Rash  . Contrast Media [Iodinated Diagnostic Agents] Other (See Comments)    Could not breath  . Iohexol Other (See Comments)    Immediately could not breathe  . Latex    . Levofloxacin Other (See Comments)    insomnia  . Sulfa Antibiotics Other (See Comments)    Historical from mother, pt states that mother says she almost died from this drug  . Codeine Nausea Only, Anxiety and Other (See Comments)    insomnia  . Lipitor [Atorvastatin Calcium] Rash    ROS: The patient denies anorexia, fever, vision changes (had recent problem, which resolved), decreased hearing, ear pain, sore throat, breast concerns, chest pain, palpitations, dizziness, syncope, cough, swelling, nausea, vomiting, constipation, abdominal pain, melena, hematochezia, indigestion/heartburn, hematuria, incontinence, dysuria, vaginal bleeding, discharge, odor or itch, genital lesions, joint pains, numbness (still has some L-sided facial numbness x 5-10 mins very sporadically, unchanged over the last 1-2 years--no other associated symptoms--no weakness, slurred speech, or associated with headache), tingling, weakness, tremor, suspicious skin lesions, depression, anxiety, abnormal bleeding/bruising, or enlarged lymph nodes. Denies any memory concerns. Some chronic mild allergies. No migraines Only rare gassiness/diarrhea (related to metformin) Down 2# since her last visit 3 months ago  PHYSICAL EXAM:  BP 130/86 mmHg  Pulse 72  Ht '5\' 4"'  (1.626 m)  Wt 240 lb (108.863 kg)  BMI 41.18 kg/m2   General Appearance:  Alert, cooperative, no distress, appears stated age   Head:  Normocephalic, without obvious abnormality, atraumatic   Eyes:  PERRL, conjunctiva/corneas clear, EOM's intact, fundi  benign   Ears:  Normal TM's and external ear canals   Nose:  Nares normal, mucosa normal, no drainage or sinus tenderness   Throat:  Lips, mucosa, and tongue normal; teeth and gums normal   Neck:  Supple, no lymphadenopathy; thyroid: no enlargement/tenderness/nodules; no carotid  bruit or JVD   Back:  Spine nontender, no curvature, ROM normal, no CVA tenderness   Lungs:  Clear to  auscultation bilaterally without wheezes, rales or ronchi; respirations unlabored   Chest Wall:  No tenderness or deformity (other than absence of R breast, WHSS)   Heart:  Regular rate and rhythm, S1 and S2 normal, no rub  or gallop. 2-3/6 SEM loudest at RUSB, and radiates into R carotid   Breast Exam:  No tenderness,  masses, or nipple discharge or inversion of L breast. No axillary lymphadenopathy. Right breast is absent.   Abdomen:  Soft, non-tender, nondistended, normoactive bowel sounds,  no masses, no hepatosplenomegaly. + abdominal obesity. Abdominal/ventral hernia, nontender. Tender in upper epigastrium, at base of sternum (small focal area).  Genitalia:  Normal external genitalia without lesions. BUS and vagina normal; No cervical motion tenderness. No abnormal vaginal discharge. Uterus and adnexa not enlarged, nontender, no masses, although exam is limited by body habitus. Pap not performed   Rectal:  Normal tone, no masses or tenderness; guaiac negative stool   Extremities:  Trace to 1+ pretibial edema. Normal diabetic foot exam   Pulses:  2+ and symmetric all extremities   Skin:  Skin color, texture, turgor normal, no rashes or lesions. Venous stasis changes bilateral lower legs with some prominent superficial veins in feet giving bluish discoloration. Feet are very dry. Normal sensation to monofilament  Lymph nodes:  Cervical, supraclavicular, and axillary nodes normal   Neurologic:  CNII-XII intact, normal strength, sensation and gait; reflexes 2+ and symmetric throughout    Psych: Normal mood, affect, hygiene and grooming.        Lab Results  Component Value Date   HGBA1C 6.7* 01/04/2015   Lab Results  Component Value Date   CHOL 110 01/04/2015   HDL 44* 01/04/2015   LDLCALC 47 01/04/2015   TRIG 96 01/04/2015   CHOLHDL 2.5 01/04/2015   Lab Results  Component Value Date   TSH 3.130 01/04/2015     Chemistry       Component Value Date/Time   NA 140 01/04/2015 0001   K 4.9 01/04/2015 0001   CL 100 01/04/2015 0001   CO2 30 01/04/2015 0001   BUN 16 01/04/2015 0001   CREATININE 0.79 01/04/2015 0001   CREATININE 0.75 06/14/2011 0530      Component Value Date/Time   CALCIUM 9.4 01/04/2015 0001   ALKPHOS 54 01/04/2015 0001   AST 43* 01/04/2015 0001   ALT 36* 01/04/2015 0001   BILITOT 0.8 01/04/2015 0001     Fasting sugar 143  ASSESSMENT/PLAN:  Annual physical exam - Plan: Visual acuity screening, POCT Urinalysis Dipstick  Essential hypertension, benign - controlled; weight loss and regular exercise recommended - Plan: bisoprolol-hydrochlorothiazide (ZIAC) 10-6.25 MG per tablet  Type 2 diabetes mellitus, controlled - controlled; daily exercise and weight loss encouraged. continue current meds - Plan: metFORMIN (GLUCOPHAGE-XR) 500 MG 24 hr tablet  Pure hypercholesterolemia - Controlled; continue current meds, lowfat diet - Plan: simvastatin (ZOCOR) 20 MG tablet  Osteopenia - repeat DEXA. calcium, D, weight-bearing exercise - Plan: DG Bone Density  Dyspnea on exertion - refer for cardiac eval; suspect COPD is still likely a factor - Plan: Ambulatory referral to Cardiology, EKG 12-Lead  Elevated LFTs - mild, stable.  weight loss encouraged (?fatty liver component)  Severe obesity (BMI >= 40) - consider [employer] online. recommend Dietician referral  DM--stable.  Farxiga/Invokana class of drugs had been discussed at last visit, rather than increasing metformin due to GI side effects, if needed for worsening sugars. Not needed at this point.  Dyspnea--refer to cardiologist for evaluation.  Will need echo and stress test Last CBC was 1 year ago, Hg 13.  Not done with recent labs (as I wasn't aware that she was still having ongoing dyspnea).  Consider repeat sooner, definitely check with next labs.  EKG today.--NSR, rate 72. Septal infarct, age undetermined--completely unchanged from 12/213/2012 EKG; no  acute abnormalities.  PFT's from 09/2014  reviewed--Severe airway obstruction, with low vital capacity. Post bronchodilator test markedly improved. Will recommend re-try COPD meds, consider Pharmquest study.  Osteopenia--order DEXA Consider repeat vitamin D if worse. (normal level in 2012).  Paperwork given for Living Will and HCPOA, encouraged to fill out and return copy to [place].  Look into Weight Watchers online. If you would prefer, we can refer you to a dietician--I highly recommend it, especially if you aren't able to get back on or be successful with the online Weight Watchers.  Remember to keep a diet log/journal (with details of portions, how prepared, etc) for a week prior to your visit.   F/u 6 months with fasting labs prior, sooner prn.

## 2015-01-10 NOTE — Patient Instructions (Signed)
HEALTH MAINTENANCE RECOMMENDATIONS:  It is recommended that you get at least 30 minutes of aerobic exercise at least 5 days/week (for weight loss, you may need as much as 60-90 minutes). This can be any activity that gets your heart rate up. This can be divided in 10-15 minute intervals if needed, but try and build up your endurance at least once a week.  Weight bearing exercise is also recommended twice weekly.  Eat a healthy diet with lots of vegetables, fruits and fiber.  "Colorful" foods have a lot of vitamins (ie green vegetables, tomatoes, red peppers, etc).  Limit sweet tea, regular sodas and alcoholic beverages, all of which has a lot of calories and sugar.  Up to 1 alcoholic drink daily may be beneficial for women (unless trying to lose weight, watch sugars).  Drink a lot of water.  Calcium recommendations are 1200-1500 mg daily (1500 mg for postmenopausal women or women without ovaries), and vitamin D 1000 IU daily.  This should be obtained from diet and/or supplements (vitamins), and calcium should not be taken all at once, but in divided doses.  Monthly self breast exams and yearly mammograms for women over the age of 57 is recommended.  Sunscreen of at least SPF 30 should be used on all sun-exposed parts of the skin when outside between the hours of 10 am and 4 pm (not just when at beach or pool, but even with exercise, golf, tennis, and yard work!)  Use a sunscreen that says "broad spectrum" so it covers both UVA and UVB rays, and make sure to reapply every 1-2 hours.  Remember to change the batteries in your smoke detectors when changing your clock times in the spring and fall.  Use your seat belt every time you are in a car, and please drive safely and not be distracted with cell phones and texting while driving.   Look into Weight Watchers online. If you would prefer, we can refer you to a dietician--I highly recommend it, especially if you aren't able to get back on or be  successful with the online Weight Watchers.  Remember to keep a diet log/journal (with details of portions, how prepared, etc) for a week prior to your visit.  Please fill out the Living Will and healthcare power of attorney paperwork.  Get it notarized, and give [place] a copy.  We are referring you for cardiology evaluation, and for bone density.  Let us know if you don't hear anything about these appointments.   Shannon Schultz , Thank you for taking time to come for your Medicare Wellness Visit. I appreciate your ongoing commitment to your health goals. Please review the following plan we discussed and let me know if I can assist you in the future.   These are the goals we discussed: Goals    None      This is a list of the screening recommended for you and due dates:  Health Maintenance  Topic Date Due  . Flu Shot  01/29/2015  . Complete foot exam   05/16/2015  . Hemoglobin A1C  07/07/2015  . Urine Protein Check  07/07/2015  . Eye exam for diabetics  08/12/2015  . Mammogram  09/25/2016  . Tetanus Vaccine  02/22/2018  . Colon Cancer Screening  08/28/2020  . Shingles Vaccine  Completed  . Pneumonia vaccines  Completed   Everything is up to date.  Get your flu shot in the fall. I don't know the true date of your next  colon cancer screening--that is up to Dr. Collene Mares (this date is 10 years from the last; not sure if you are due again in 5 vs 10).

## 2015-01-17 ENCOUNTER — Other Ambulatory Visit: Payer: Self-pay | Admitting: *Deleted

## 2015-01-17 DIAGNOSIS — J439 Emphysema, unspecified: Secondary | ICD-10-CM

## 2015-01-17 MED ORDER — BUDESONIDE-FORMOTEROL FUMARATE 160-4.5 MCG/ACT IN AERO: 2.0000 | INHALATION_SPRAY | Freq: Two times a day (BID) | RESPIRATORY_TRACT | Status: DC

## 2015-01-18 ENCOUNTER — Ambulatory Visit
Admission: RE | Admit: 2015-01-18 | Discharge: 2015-01-18 | Disposition: A | Discharge status: Home / Self Care | Payer: BLUE CROSS/BLUE SHIELD | Source: Ambulatory Visit | Attending: Family Medicine | Admitting: Family Medicine

## 2015-01-18 DIAGNOSIS — M8580 Other specified disorders of bone density and structure, unspecified site: Secondary | ICD-10-CM

## 2015-01-18 LAB — HM DEXA SCAN: HM Dexa Scan: NORMAL

## 2015-01-23 ENCOUNTER — Encounter: Payer: Self-pay | Admitting: Internal Medicine

## 2015-02-06 ENCOUNTER — Ambulatory Visit (INDEPENDENT_AMBULATORY_CARE_PROVIDER_SITE_OTHER): Payer: BLUE CROSS/BLUE SHIELD | Admitting: Family Medicine

## 2015-02-06 ENCOUNTER — Encounter: Payer: Self-pay | Admitting: Family Medicine

## 2015-02-06 VITALS — BP 120/60 | HR 65 | Wt 244.0 lb

## 2015-02-06 DIAGNOSIS — Z8719 Personal history of other diseases of the digestive system: Secondary | ICD-10-CM

## 2015-02-06 DIAGNOSIS — K602 Anal fissure, unspecified: Secondary | ICD-10-CM

## 2015-02-06 DIAGNOSIS — Z87898 Personal history of other specified conditions: Secondary | ICD-10-CM

## 2015-02-06 LAB — POCT URINALYSIS DIPSTICK
Bilirubin, UA: NEGATIVE
Glucose, UA: NEGATIVE
Ketones, UA: NEGATIVE
Leukocytes, UA: NEGATIVE
Nitrite, UA: POSITIVE
Spec Grav, UA: >=1.03
Urobilinogen, UA: NEGATIVE
pH, UA: 5.5

## 2015-02-06 NOTE — Progress Notes (Signed)
   Subjective:    Patient ID: Shannon Schultz, female    DOB: January 04, 1946, 69 y.o.   MRN: [id number]  HPI She states that after she had a bowel movement today she noted bright red blood in the stool which took a while to diminish. She has had a colonoscopy and was told she had a hemorrhoid. She's had no discomfort, discharge or other problems. No urinary symptoms.   Review of Systems     Objective:   Physical Exam Anal exam shows no external lesions but there is some hemorrhoidal type tissue. Small fissure noted at 12:00 that is slightly tender to palpation. No bleeding was noted. Digital rectal exam showed brown stool that was guaiac negative       Assessment & Plan:  Anal fissure  History of hemorrhoids  I reassured her that this was a fissure. Recommended fluids, bulk in diet to help keep her regular as well as warm tub baths or directing warm water on this in the shower. Encouraged her to call if symptoms do not completely diminish. Although she was nitrite positive I will not treat as she is not having any urinary symptoms.

## 2015-02-07 ENCOUNTER — Ambulatory Visit: Payer: BLUE CROSS/BLUE SHIELD | Admitting: Family Medicine

## 2015-02-14 ENCOUNTER — Telehealth: Payer: Self-pay | Admitting: *Deleted

## 2015-02-14 NOTE — Telephone Encounter (Signed)
yes

## 2015-02-14 NOTE — Telephone Encounter (Signed)
Error

## 2015-02-14 NOTE — Telephone Encounter (Signed)
Patient called an is requesting rx for mastectomy bras be faxed to [facility] in [place] @ [phone]. Is this okay?

## 2015-03-08 ENCOUNTER — Encounter: Payer: Self-pay | Admitting: Cardiology

## 2015-03-08 ENCOUNTER — Ambulatory Visit (INDEPENDENT_AMBULATORY_CARE_PROVIDER_SITE_OTHER): Payer: BLUE CROSS/BLUE SHIELD | Admitting: Cardiology

## 2015-03-08 VITALS — BP 136/84 | HR 82 | Ht 64.5 in | Wt 245.0 lb

## 2015-03-08 DIAGNOSIS — E7800 Pure hypercholesterolemia, unspecified: Secondary | ICD-10-CM

## 2015-03-08 DIAGNOSIS — I2089 Other forms of angina pectoris: Secondary | ICD-10-CM | POA: Insufficient documentation

## 2015-03-08 DIAGNOSIS — R0600 Dyspnea, unspecified: Secondary | ICD-10-CM | POA: Diagnosis not present

## 2015-03-08 DIAGNOSIS — I208 Other forms of angina pectoris: Secondary | ICD-10-CM | POA: Diagnosis not present

## 2015-03-08 DIAGNOSIS — I10 Essential (primary) hypertension: Secondary | ICD-10-CM

## 2015-03-08 DIAGNOSIS — J438 Other emphysema: Secondary | ICD-10-CM | POA: Diagnosis not present

## 2015-03-08 DIAGNOSIS — I350 Nonrheumatic aortic (valve) stenosis: Secondary | ICD-10-CM

## 2015-03-08 NOTE — Progress Notes (Signed)
Cardiology Office Note   Date:  03/08/2015   ID:  Shannon Schultz, DOB [birth year], MRN [id number]  PCP:  [name], MD  Cardiologist:   Candee Furbish, MD       History of Present Illness: JEWELIANA Schultz is a 69 y.o. female who presents for evaluation of dyspnea on exertion. She has been diagnosed with COPD, quit smoking in 2012, obesity, diabetes, hypertension. She did not think that Symbicort helped with her symptoms of dyspnea. She will use samples. She will be short of breath when walking to the mailbox, 50 feet, going up stairs, carrying laundry.  PFTs from 10/05/14 conclusion was severe airway obstruction improved with bronchodilation. FEV1-52%.  Both parents had heart disease. Father died 35 MI, Mother died 97, tachycardia.   Over the past few months she has noted increased shortness of breath, not relieved by Symbicort over the past 6 weeks. She wonders if this could be an anginal equivalent. No specific chest pain, just SOB. No orthopnea. She will occasionally get fleeting palpitations which can take her breath away but the last 1-2 seconds. I explained that these are likely premature beats.   Past Medical History  Diagnosis Date  . Allergy   . Elevated cholesterol   . Hypertension   . Genital HSV     gets on hip  . Diverticulosis   . 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 Vitamin D deficiency disease   . Osteopenia   . Migraine   . Elevated liver enzymes     fatty liver per ultrasound per pt  . Impaired glucose tolerance   . Colon polyp   . Pneumonia 06/06/2011  . Type 2 diabetes mellitus   . HSV (herpes simplex virus) infection     on hip--on daily suppression  . Heart murmur      echo 05/2011 mild aortic stenosis  . Family history of malignant neoplasm of breast   . FHx: BRCA2 gene positive     sister with BRCA2 mutation (pt tested NEGATIVE)    Past Surgical History  Procedure Laterality Date  . Mastectomy Right 1992    R breast for cancer  . Colonoscopy   2009, 08/2010    Dr. Collene Mares  . Cholecystectomy  2003  . Hernia repair  [id number]    umbilical     Current Outpatient Prescriptions  Medication Sig Dispense Refill  . acetaminophen (TYLENOL) 325 MG tablet Take 650 mg by mouth every 6 (six) hours as needed for mild pain, fever or headache.    . albuterol (PROVENTIL) (2.5 MG/3ML) 0.083% nebulizer solution Take 3 mLs (2.5 mg total) by nebulization every 4 (four) hours as needed for wheezing or shortness of breath. 30 vial 0  . aspirin EC 81 MG tablet Take 81 mg by mouth daily.      Marland Kitchen BAYER MICROLET LANCETS lancets Use as instructed 100 each PRN  . bisoprolol-hydrochlorothiazide (ZIAC) 10-6.25 MG per tablet Take 1 tablet by mouth daily. 90 tablet 1  . budesonide-formoterol (SYMBICORT) 160-4.5 MCG/ACT inhaler Inhale 2 puffs into the lungs 2 (two) times daily. 3 Inhaler 0  . Calcium Carbonate-Vitamin D (CALCIUM 600+D) 600-400 MG-UNIT per tablet Take 1 tablet by mouth daily.     . cetirizine (KLS ALLER-TEC) 10 MG tablet Take 10 mg by mouth daily.    . Cholecalciferol (VITAMIN D) 2000 UNITS tablet Take 4,000 Units by mouth daily.    Marland Kitchen glucose blood (BAYER CONTOUR NEXT TEST) test strip  Use once daily 100 each PRN  . metFORMIN (GLUCOPHAGE-XR) 500 MG 24 hr tablet Take 1 tablet (500 mg total) by mouth 2 (two) times daily. 180 tablet 1  . Multiple Vitamins-Minerals (CENTRUM SILVER PO) Take 1 tablet by mouth.      Marland Kitchen PROAIR HFA 108 (90 BASE) MCG/ACT inhaler INHALE 2 PUFFS INTO THE LUNGS EVERY 6 (SIX) HOURS AS NEEDED FOR WHEEZING. 8.5 Inhaler 0  . Probiotic Product (PHILLIPS COLON HEALTH PO) Take 1 capsule by mouth daily.    . simvastatin (ZOCOR) 20 MG tablet Take 1 tablet (20 mg total) by mouth at bedtime. 90 tablet 1  . valACYclovir (VALTREX) 500 MG tablet Take 1 tablet (500 mg total) by mouth daily. 90 tablet 3   No current facility-administered medications for this visit.    Allergies:   Lisinopril; Contrast media; Iohexol; Latex; Levofloxacin; Sulfa  antibiotics; Codeine; and Lipitor    Social History:  The patient  reports that she quit smoking about 3 years ago. Her smoking use included Cigarettes. She has a 46 pack-year smoking history. She has never used smokeless tobacco. She reports that she drinks alcohol. She reports that she does not use illicit drugs.   Family History:  The patient's family history includes Allergies in her sister and sister; Asthma in her sister and sister; Breast cancer (age of onset: 30) in her sister; Cancer in her maternal grandmother and other; Diabetes in her father; Heart disease in her father and mother; Hyperlipidemia in her sister and sister; Hypertension in her father; Stroke (age of onset: 89) in her paternal grandmother; Tuberculosis in her maternal grandfather.    ROS:  Please see the history of present illness.   Otherwise, review of systems are positive for occasional palpitations/fluttering sensation in her throat that last less than 2 seconds, sometimes catches her breath. Shortness of breath with activity, skipping heartbeats, snoring, hearing loss.   All other systems are reviewed and negative.    PHYSICAL EXAM: VS:  BP 136/84 mmHg  Pulse 82  Ht 5' 4.5" (1.638 m)  Wt 245 lb (111.131 kg)  BMI 41.42 kg/m2  SpO2 94% , BMI Body mass index is 41.42 kg/(m^2). GEN: Well nourished, well developed, in no acute distress HEENT: normal Neck: no JVD, radiation of aortic valve stenosis murmur to carotids, no masses Cardiac: RRR; 2/6 systolic right upper sternal border murmur, no rubs, or gallops, chronic appearing lower extremity edema  Respiratory:  clear to auscultation bilaterally, normal work of breathing GI: soft, nontender, nondistended, + BS overweight MS: no deformity or atrophy Skin: warm and dry, no rash hyperpigmented changes lower extremities Neuro:  Strength and sensation are intact Psych: euthymic mood, full affect   EKG:  EKG is not ordered today.  01/10/15 shows sinus rhythm with  poor R-wave progression  Recent Labs: 01/04/2015: ALT 36*; BUN 16; Creat 0.79; Potassium 4.9; Sodium 140; TSH 3.130    Lipid Panel    Component Value Date/Time   CHOL 110 01/04/2015 0001   TRIG 96 01/04/2015 0001   HDL 44* 01/04/2015 0001   CHOLHDL 2.5 01/04/2015 0001   VLDL 19 01/04/2015 0001   LDLCALC 47 01/04/2015 0001      Wt Readings from Last 3 Encounters:  03/08/15 245 lb (111.131 kg)  02/06/15 244 lb (110.678 kg)  01/10/15 240 lb (108.863 kg)    Echocardiogram 06/12/11-normal ejection fraction.  Other studies Reviewed: Additional studies/ records that were reviewed today include: Prior office records, PFTs, echocardiogram from 2012, Dr. Melvyn Novas  consultation 2013. Review of the above records demonstrates: as above   ASSESSMENT AND PLAN:  1.  Dyspnea on exertion-this may be an anginal equivalent/angina. Both her parents had heart disease. She is concerned about the possibility given her extensive prior smoking history. She also understands that her morbid obesity may playing a role as well. She has been diagnosed as well with COPD however her Symbicort has not really altered her symptoms. She has been taking for 6 weeks she states. Pulmonary function tests showed an FEV1 of 52% which responded well to bronchodilation.  2. Morbid obesity-continue to encourage weight loss. This will help with constellation of symptoms.  3. COPD-she is on long-term inhaler. She is on bisoprolol which should be less bronco spastic.  4. Hyperlipidemia-continue with simvastatin. Excellent.  5. Aortic stenosis-mild in 2012. This is the murmur we are hearing on exam with radiation to the carotids. Hopefully this has not advanced to the point where it is causing symptoms. Checking echocardiogram.   Current medicines are reviewed at length with the patient today.  The patient does not have concerns regarding medicines.  The following changes have been made:  no change  Labs/ tests ordered today  include:   Orders Placed This Encounter  Procedures  . Myocardial Perfusion Imaging  . Echocardiogram     Disposition:   I will follow-up with stress test and echocardiogram. In the meantime, we will follow-up in one year given her aortic stenosis.   Bobby Rumpf, MD  03/08/2015 12:00 PM    [address], [place], [place]  [address] Phone: [phone]; Fax: [phone]

## 2015-03-08 NOTE — Patient Instructions (Signed)
Medication Instructions:  Your physician recommends that you continue on your current medications as directed. Please refer to the Current Medication list given to you today.  Testing/Procedures: Your physician has requested that you have a lexiscan myoview. For further information please visit [email or web address]. Please follow instruction sheet, as given.  Your physician has requested that you have an echocardiogram. Echocardiography is a painless test that uses sound waves to create images of your heart. It provides your doctor with information about the size and shape of your heart and how well your heart's chambers and valves are working. This procedure takes approximately one hour. There are no restrictions for this procedure.  Follow-Up: Follow up in 1 year with Dr. Marlou Porch.  You will receive a letter in the mail 2 months before you are due.  Please call us when you receive this letter to schedule your follow up appointment.  Thank you for choosing [facility]!!

## 2015-03-12 ENCOUNTER — Other Ambulatory Visit: Payer: Self-pay | Admitting: Family Medicine

## 2015-03-12 ENCOUNTER — Ambulatory Visit
Admission: RE | Admit: 2015-03-12 | Discharge: 2015-03-12 | Disposition: A | Discharge status: Home / Self Care | Payer: No Typology Code available for payment source | Source: Ambulatory Visit | Attending: Family Medicine | Admitting: Family Medicine

## 2015-03-12 DIAGNOSIS — Z006 Encounter for examination for normal comparison and control in clinical research program: Secondary | ICD-10-CM

## 2015-03-12 DIAGNOSIS — J449 Chronic obstructive pulmonary disease, unspecified: Secondary | ICD-10-CM

## 2015-03-13 ENCOUNTER — Telehealth: Payer: Self-pay

## 2015-03-13 NOTE — Telephone Encounter (Signed)
Records sent out to [facility] on 03/13/15

## 2015-03-19 ENCOUNTER — Telehealth (HOSPITAL_COMMUNITY): Payer: Self-pay | Admitting: *Deleted

## 2015-03-19 NOTE — Telephone Encounter (Signed)
Patient given detailed instructions per Myocardial Perfusion Study Information Sheet for test on 03/21/15 at 0830. Patient notified to arrive 15 minutes early and that it is imperative to arrive on time for appointment to keep from having the test rescheduled.  If you need to cancel or reschedule your appointment, please call the office within 24 hours of your appointment. Failure to do so may result in a cancellation of your appointment, and a $50 no show fee. Patient verbalized understanding. Kerrie Latour, Ranae Palms

## 2015-03-21 ENCOUNTER — Other Ambulatory Visit: Payer: Self-pay

## 2015-03-21 ENCOUNTER — Ambulatory Visit (HOSPITAL_COMMUNITY): Payer: BLUE CROSS/BLUE SHIELD | Attending: Internal Medicine

## 2015-03-21 ENCOUNTER — Ambulatory Visit (HOSPITAL_BASED_OUTPATIENT_CLINIC_OR_DEPARTMENT_OTHER): Payer: BLUE CROSS/BLUE SHIELD

## 2015-03-21 DIAGNOSIS — R0600 Dyspnea, unspecified: Secondary | ICD-10-CM

## 2015-03-21 DIAGNOSIS — I208 Other forms of angina pectoris: Secondary | ICD-10-CM | POA: Diagnosis not present

## 2015-03-21 DIAGNOSIS — I350 Nonrheumatic aortic (valve) stenosis: Secondary | ICD-10-CM | POA: Diagnosis not present

## 2015-03-21 MED ORDER — TECHNETIUM TC 99M SESTAMIBI GENERIC - CARDIOLITE
32.9000 | Freq: Once | INTRAVENOUS | Status: AC | PRN
  Administered 2015-03-21: 32.9 via INTRAVENOUS

## 2015-03-21 MED ORDER — REGADENOSON 0.4 MG/5ML IV SOLN
0.4000 mg | Freq: Once | INTRAVENOUS | Status: AC
  Administered 2015-03-21: 0.4 mg via INTRAVENOUS

## 2015-03-22 ENCOUNTER — Ambulatory Visit (HOSPITAL_COMMUNITY): Payer: BLUE CROSS/BLUE SHIELD | Attending: Cardiology

## 2015-03-22 DIAGNOSIS — R0989 Other specified symptoms and signs involving the circulatory and respiratory systems: Secondary | ICD-10-CM

## 2015-03-22 LAB — MYOCARDIAL PERFUSION IMAGING
LV dias vol: 90 mL
LV sys vol: 26 mL
Peak HR: 76 {beats}/min
RATE: 0.25
Rest HR: 63 {beats}/min
SDS: 0
SRS: 0
SSS: 0
TID: 0.89

## 2015-03-22 MED ORDER — TECHNETIUM TC 99M SESTAMIBI GENERIC - CARDIOLITE
32.2000 | Freq: Once | INTRAVENOUS | Status: AC | PRN
  Administered 2015-03-22: 32.2 via INTRAVENOUS

## 2015-05-30 ENCOUNTER — Other Ambulatory Visit (INDEPENDENT_AMBULATORY_CARE_PROVIDER_SITE_OTHER): Payer: BLUE CROSS/BLUE SHIELD

## 2015-05-30 DIAGNOSIS — Z23 Encounter for immunization: Secondary | ICD-10-CM

## 2015-07-06 ENCOUNTER — Telehealth: Payer: Self-pay

## 2015-07-06 ENCOUNTER — Telehealth: Payer: Self-pay | Admitting: Family Medicine

## 2015-07-06 DIAGNOSIS — E119 Type 2 diabetes mellitus without complications: Secondary | ICD-10-CM

## 2015-07-06 NOTE — Telephone Encounter (Signed)
Received fax from [facility] mail order pharmacy. Refills requested for Bisoprolol hctz 10-6.25mg , Metfomin HCL 500mg  and Simvastatin 20 mg.

## 2015-07-06 NOTE — Telephone Encounter (Signed)
appt 1/26. See if she needs refilled prior to her visit or if can wait

## 2015-07-06 NOTE — Telephone Encounter (Signed)
error 

## 2015-07-09 MED ORDER — METFORMIN HCL ER 500 MG PO TB24: 500.0000 mg | ORAL_TABLET | Freq: Two times a day (BID) | ORAL | Status: DC

## 2015-07-09 NOTE — Telephone Encounter (Signed)
Spoke with patient and she will need only the metformin called in prior to the 1/26 appt. Sent for 90 days per her request.

## 2015-07-10 ENCOUNTER — Telehealth: Payer: Self-pay

## 2015-07-10 NOTE — Telephone Encounter (Signed)
Refill requests for Bisoprolol-HCTZ 10-6.25mg , Simvastatin 20mg , and Valacyclovir HCL 500mg  sent in to [facility] (in chart). All 90 day supply

## 2015-07-11 NOTE — Telephone Encounter (Signed)
Patient has appt 1/26 and does not need these filled until then.

## 2015-07-19 ENCOUNTER — Other Ambulatory Visit: Payer: Commercial Managed Care - HMO

## 2015-07-19 DIAGNOSIS — E119 Type 2 diabetes mellitus without complications: Secondary | ICD-10-CM | POA: Diagnosis not present

## 2015-07-19 DIAGNOSIS — R0609 Other forms of dyspnea: Secondary | ICD-10-CM | POA: Diagnosis not present

## 2015-07-19 DIAGNOSIS — E7800 Pure hypercholesterolemia, unspecified: Secondary | ICD-10-CM | POA: Diagnosis not present

## 2015-07-19 DIAGNOSIS — R0600 Dyspnea, unspecified: Secondary | ICD-10-CM

## 2015-07-19 LAB — COMPREHENSIVE METABOLIC PANEL
ALT: 33 U/L — ABNORMAL HIGH (ref 6–29)
AST: 37 U/L — ABNORMAL HIGH (ref 10–35)
Albumin: 4 g/dL (ref 3.6–5.1)
Alkaline Phosphatase: 51 U/L (ref 33–130)
BUN: 19 mg/dL (ref 7–25)
CO2: 29 mmol/L (ref 20–31)
Calcium: 9.8 mg/dL (ref 8.6–10.4)
Chloride: 101 mmol/L (ref 98–110)
Creat: 0.73 mg/dL (ref 0.50–0.99)
Glucose, Bld: 153 mg/dL — ABNORMAL HIGH (ref 65–99)
Potassium: 4.9 mmol/L (ref 3.5–5.3)
Sodium: 139 mmol/L (ref 135–146)
Total Bilirubin: 0.8 mg/dL (ref 0.2–1.2)
Total Protein: 6.7 g/dL (ref 6.1–8.1)

## 2015-07-19 LAB — LIPID PANEL
Cholesterol: 124 mg/dL — ABNORMAL LOW (ref 125–200)
HDL: 50 mg/dL (ref >=46)
LDL Cholesterol: 57 mg/dL (ref <130)
Total CHOL/HDL Ratio: 2.5 Ratio (ref <=5.0)
Triglycerides: 84 mg/dL (ref <150)
VLDL: 17 mg/dL (ref <30)

## 2015-07-19 LAB — CBC WITH DIFFERENTIAL/PLATELET
Basophils Absolute: 0 10*3/uL (ref 0.0–0.1)
Basophils Relative: 0 % (ref 0–1)
Eosinophils Absolute: 0.1 10*3/uL (ref 0.0–0.7)
Eosinophils Relative: 2 % (ref 0–5)
HCT: 41.7 % (ref 36.0–46.0)
Hemoglobin: 13.9 g/dL (ref 12.0–15.0)
Lymphocytes Relative: 25 % (ref 12–46)
Lymphs Abs: 1.3 10*3/uL (ref 0.7–4.0)
MCH: 31 pg (ref 26.0–34.0)
MCHC: 33.3 g/dL (ref 30.0–36.0)
MCV: 93.1 fL (ref 78.0–100.0)
MPV: 9.8 fL (ref 8.6–12.4)
Monocytes Absolute: 0.3 10*3/uL (ref 0.1–1.0)
Monocytes Relative: 6 % (ref 3–12)
Neutro Abs: 3.4 10*3/uL (ref 1.7–7.7)
Neutrophils Relative %: 67 % (ref 43–77)
Platelets: 111 10*3/uL — ABNORMAL LOW (ref 150–400)
RBC: 4.48 MIL/uL (ref 3.87–5.11)
RDW: 14.2 % (ref 11.5–15.5)
WBC: 5.1 10*3/uL (ref 4.0–10.5)

## 2015-07-20 LAB — MICROALBUMIN / CREATININE URINE RATIO
Creatinine, Urine: 22 mg/dL (ref 20–320)
Microalb Creat Ratio: 14 mcg/mg creat (ref <30)
Microalb, Ur: 0.3 mg/dL

## 2015-07-20 LAB — HEMOGLOBIN A1C
Hgb A1c MFr Bld: 7.2 % — ABNORMAL HIGH (ref <5.7)
Mean Plasma Glucose: 160 mg/dL — ABNORMAL HIGH (ref <117)

## 2015-07-26 ENCOUNTER — Telehealth: Payer: Self-pay | Admitting: Family Medicine

## 2015-07-26 ENCOUNTER — Ambulatory Visit (INDEPENDENT_AMBULATORY_CARE_PROVIDER_SITE_OTHER): Payer: Commercial Managed Care - HMO | Admitting: Family Medicine

## 2015-07-26 ENCOUNTER — Encounter: Payer: Self-pay | Admitting: Family Medicine

## 2015-07-26 VITALS — BP 122/72 | HR 72 | Ht 64.0 in | Wt 253.2 lb

## 2015-07-26 DIAGNOSIS — J438 Other emphysema: Secondary | ICD-10-CM

## 2015-07-26 DIAGNOSIS — B009 Herpesviral infection, unspecified: Secondary | ICD-10-CM | POA: Diagnosis not present

## 2015-07-26 DIAGNOSIS — E119 Type 2 diabetes mellitus without complications: Secondary | ICD-10-CM

## 2015-07-26 DIAGNOSIS — I350 Nonrheumatic aortic (valve) stenosis: Secondary | ICD-10-CM

## 2015-07-26 DIAGNOSIS — E7800 Pure hypercholesterolemia, unspecified: Secondary | ICD-10-CM

## 2015-07-26 DIAGNOSIS — R7401 Elevation of levels of liver transaminase levels: Secondary | ICD-10-CM

## 2015-07-26 DIAGNOSIS — R740 Nonspecific elevation of levels of transaminase and lactic acid dehydrogenase [LDH]: Secondary | ICD-10-CM | POA: Diagnosis not present

## 2015-07-26 DIAGNOSIS — I10 Essential (primary) hypertension: Secondary | ICD-10-CM | POA: Diagnosis not present

## 2015-07-26 DIAGNOSIS — Z5181 Encounter for therapeutic drug level monitoring: Secondary | ICD-10-CM

## 2015-07-26 MED ORDER — SIMVASTATIN 20 MG PO TABS: 20.0000 mg | ORAL_TABLET | Freq: Every day | ORAL | Status: DC

## 2015-07-26 MED ORDER — METFORMIN HCL ER 500 MG PO TB24: 1500.0000 mg | ORAL_TABLET | Freq: Every day | ORAL | Status: DC

## 2015-07-26 MED ORDER — VALACYCLOVIR HCL 500 MG PO TABS: 500.0000 mg | ORAL_TABLET | Freq: Every day | ORAL | Status: DC

## 2015-07-26 MED ORDER — BISOPROLOL-HYDROCHLOROTHIAZIDE 10-6.25 MG PO TABS: 1.0000 | ORAL_TABLET | Freq: Every day | ORAL | Status: DC

## 2015-07-26 NOTE — Progress Notes (Signed)
Chief Complaint  Patient presents with  . Diabetes    nonfasting med check.   Hypertension: Blood pressure hasn't been checked at home recently (previously used husband's arm monitor). Denies dizziness, chest pain. Denies side effects of medications. Compliant with medication.  Diabetes: Blood sugars at home are running around 150-160, fasting. Not checking other times of day often, when she does it is around 120. Sugars had been in the 140's fasting prior to the holidays. Denies hypoglycemia. Denies polydipsia and polyuria. Last eye exam was February 2016. She has cataracts, no new vision changes. Patient follows a low sugar diet and checks feet regularly without concerns. Denies numbness, tingling or sores/lesions. She had realized she gained weight, but not how much.  She only recently started exercise program, see below.  Hyperlipidemia follow-up: Patient is reportedly following a low-fat, low cholesterol diet. Compliant with medications and denies medication side effects.   COPD: She was treated with Symbicort, but never felt like it helped much with her dyspnea on exertion (which is why we did cardiac eval, see below).  She is now participating in the pharmquest study (so not taking Symbicort). She is on a study drug (double-blind), using inhaler twice daily, but doesn't note much improvement.  She uses her albuterol 2x/week, when she goes grocery shopping, and is now using it beforehand, which helps.  DOE: She gets short of breath with walking to mailbox and back (50 feet, on the way back is up hill), going upstairs, carrying laundry basket. She thinks "a lot of it is my weight". She quit smoking 05/2011. Had PFT's in April (see report; abnormal, with improvement after BD treatment). She saw cardiologist (Dr. Marlou Porch) in September, who did echo and stress test. Stress test:   Nuclear stress EF: 71%.   There was no ST segment deviation noted during stress.   The study is normal. No  evidence of ischemia. No evidence of infarction .   This is a low risk study. Echo:  Moderateaortic stenosis. Mean gradient (S): 17 mm Hg. - Compared to a prior echo in 2012 - there is now moderate aortic stenosis.  Obesity: She recently found the paperwork from [email or web address] in her folder at home, and plans to look into it again.  We had discussed treating her anxiety/moods (wellbutrin or celexa) given that she reports being an emotional eater (she had declined), and overeaters anonymous had also been suggested. She previously lost weight on Weight Watchers, but they closed the [facility] near her.  She has gained 13# since her last visit here 6 months ago. Her husband has also gained weight and would also like to lose. She just started an exercise program called [employer], in [place], similar to [employer].  She started last week, 30 minutes 3x/week.  She has increased by 5 minutes each time she went. They also have elliptical and exercise bikes that she will hope to also use when able.  She started having some right knee pain, started prior to exercise. She had been on her knees looking for stuff under her bed, sore since then. Hurts when she first stands up. Doesn't hurt while exercising  HSV--had an outbreak a few months ago that was extremely minor and short-lived. This was the first time in a very long time.  Complaining of cramping in her hands and feet. Some days worse than others. She wasn't having any cramping on the day her labs were drawn.  PMH, PSH, SH reviewed  Outpatient Encounter Prescriptions as of  07/26/2015  Medication Sig Note  . aspirin EC 81 MG tablet Take 81 mg by mouth daily.     Marland Kitchen BAYER MICROLET LANCETS lancets Use as instructed   . bisoprolol-hydrochlorothiazide (ZIAC) 10-6.25 MG per tablet Take 1 tablet by mouth daily.   . Calcium Carbonate-Vitamin D (CALCIUM 600+D) 600-400 MG-UNIT per tablet Take 1 tablet by mouth daily.    . cetirizine (KLS ALLER-TEC) 10 MG  tablet Take 10 mg by mouth daily.   . Cholecalciferol (VITAMIN D) 2000 UNITS tablet Take 4,000 Units by mouth daily.   Marland Kitchen glucose blood (BAYER CONTOUR NEXT TEST) test strip Use once daily   . metFORMIN (GLUCOPHAGE-XR) 500 MG 24 hr tablet Take 1 tablet (500 mg total) by mouth 2 (two) times daily.   . Multiple Vitamins-Minerals (CENTRUM SILVER PO) Take 1 tablet by mouth.     Marland Kitchen PROAIR HFA 108 (90 BASE) MCG/ACT inhaler INHALE 2 PUFFS INTO THE LUNGS EVERY 6 (SIX) HOURS AS NEEDED FOR WHEEZING. 07/26/2015: Uses 2x/week, prior to grocery shopping, and as needed  . Probiotic Product (PHILLIPS COLON HEALTH PO) Take 1 capsule by mouth daily.   . simvastatin (ZOCOR) 20 MG tablet Take 1 tablet (20 mg total) by mouth at bedtime.   . valACYclovir (VALTREX) 500 MG tablet Take 1 tablet (500 mg total) by mouth daily.   Marland Kitchen acetaminophen (TYLENOL) 325 MG tablet Take 650 mg by mouth every 6 (six) hours as needed for mild pain, fever or headache. Reported on 07/26/2015 05/15/2014: Uses prn for hand arthritis pain  . albuterol (PROVENTIL) (2.5 MG/3ML) 0.083% nebulizer solution Take 3 mLs (2.5 mg total) by nebulization every 4 (four) hours as needed for wheezing or shortness of breath. (Patient not taking: Reported on 07/26/2015)   . budesonide-formoterol (SYMBICORT) 160-4.5 MCG/ACT inhaler Inhale 2 puffs into the lungs 2 (two) times daily. (Patient not taking: Reported on 07/26/2015) 07/26/2015: Not taking; on study drug   No facility-administered encounter medications on file as of 07/26/2015.   Allergies  Allergen Reactions  . Lisinopril Shortness Of Breath and Rash  . Contrast Media [Iodinated Diagnostic Agents] Other (See Comments)    Could not breath  . Iohexol Other (See Comments)    Immediately could not breathe  . Latex   . Levofloxacin Other (See Comments)    insomnia  . Sulfa Antibiotics Other (See Comments)    Historical from mother, pt states that mother says she almost died from this drug  . Codeine Nausea  Only, Anxiety and Other (See Comments)    insomnia  . Lipitor [Atorvastatin Calcium] Rash    ROS: no fever, chills, headaches, dizziness, URI symptoms, cough, GI or GU complaints, bleeding, bruising, rash, urinary complaints, numbness, tingling.  +right knee pain/stiffness as per HPI.    PHYSICAL EXAM: BP 122/72 mmHg  Pulse 72  Ht 5\' 4"  (1.626 m)  Wt 253 lb 3.2 oz (114.851 kg)  BMI 43.44 kg/m2  Well developed, pleasant female in no distress HEENT: PERRL, EOMI, conjunctiva clear, OP clear Neck: no lymphadenopathy, thyromegaly or carotid bruit Heart: regular rate and rhythm with 2-3/6 SEM loudest at RUSB Lungs: clear bilaterally, no wheezes, fair air movement Abdomen: soft, obese.Nontender. No mass. Obese Extremities: Venous stasis changes with hyperpigmentation bilaterally in the distal lower legs. 2+ pulses. Trace to 1+ pitting edema, symmetric Skin: no rashes or lesions (just hyperpigmentation as above).  Psych: normal mood, affect, hygiene and grooming Neuro: alert and oriented. Normal strength, sensation, gait. Cranial nerves grossly intact.   Lab  Results  Component Value Date   HGBA1C 7.2* 07/19/2015     Chemistry      Component Value Date/Time   NA 139 07/19/2015 0001   K 4.9 07/19/2015 0001   CL 101 07/19/2015 0001   CO2 29 07/19/2015 0001   BUN 19 07/19/2015 0001   CREATININE 0.73 07/19/2015 0001   CREATININE 0.75 06/14/2011 0530      Component Value Date/Time   CALCIUM 9.8 07/19/2015 0001   ALKPHOS 51 07/19/2015 0001   AST 37* 07/19/2015 0001   ALT 33* 07/19/2015 0001   BILITOT 0.8 07/19/2015 0001     Fasting sugar 153  Lab Results  Component Value Date   CHOL 124* 07/19/2015   HDL 50 07/19/2015   LDLCALC 57 07/19/2015   TRIG 84 07/19/2015   CHOLHDL 2.5 07/19/2015   Lab Results  Component Value Date   WBC 5.1 07/19/2015   HGB 13.9 07/19/2015   HCT 41.7 07/19/2015   MCV 93.1 07/19/2015   PLT 111* 07/19/2015   Urine microalbumin/Cr  14  (normal)  ASSESSMENT/PLAN:  Controlled type 2 diabetes mellitus without complication, without long-term current use of insulin ([place]) - now above goal, due to weight gain and poor diet; increase metformin to 1500mg /d (declines adding new med such as invokana) - Plan: metFORMIN (GLUCOPHAGE-XR) 500 MG 24 hr tablet  Transaminitis - mild, stable  Other emphysema (HCC) - stable; f/u after completion of study for rx's.  Aortic stenosis - now moderate.  Reviewed s/sx of severe AS  Pure hypercholesterolemia - Controlled; continue current meds, lowfat diet - Plan: simvastatin (ZOCOR) 20 MG tablet  Essential hypertension, benign - controlled; weight loss and regular exercise recommended - Plan: bisoprolol-hydrochlorothiazide (ZIAC) 10-6.25 MG tablet  HSV (herpes simplex virus) infection - Plan: valACYclovir (VALTREX) 500 MG tablet  Consider pulmonary rehab. Has 2 more months in the study.  Discussed Farxiga/Jardiance/invokana  vs increasing metformin. Declines new med for now. Increase metformin to 3/day.  Counseled extensively re: healthy food choices, portion control, exercise, Weight Watchers, Overeater's anonymous as potential options to help her.   Increase your metformin to 3/day. Limit portion sizes, eat more fruits/vegetables, cut back on sweets and carbs. I strongly encourage you to restart Weight Watchers; consider Overeaters Anonymous. Hopefully, having your husband also interested in losing weight will help keep you both on the right track. If you change your mind about the other type of diabetes medication (the one that help also with weight loss), let me know.  Look into the costs of Jardiance, [place].  Try compression stockings.

## 2015-07-26 NOTE — Telephone Encounter (Signed)
Pt made [facility] appt on 02/14/16 however she would like to come in on 02/11/16 for fasting labs prior to that appt. Is that OK?

## 2015-07-26 NOTE — Patient Instructions (Addendum)
  Increase your metformin to 3/day. Limit portion sizes, eat more fruits/vegetables, cut back on sweets and carbs. I strongly encourage you to restart Weight Watchers; consider Overeaters Anonymous. Hopefully, having your husband also interested in losing weight will help keep you both on the right track. If you change your mind about the other type of diabetes medication (the one that help also with weight loss), let me know.  Look into the costs of Jardiance, [place].  Try compression stockings to help prevent swelling in your feet. Continue to follow a low sodium diet.

## 2015-07-27 ENCOUNTER — Telehealth: Payer: Self-pay | Admitting: Family Medicine

## 2015-07-27 DIAGNOSIS — E119 Type 2 diabetes mellitus without complications: Secondary | ICD-10-CM

## 2015-07-27 NOTE — Telephone Encounter (Signed)
Pt called and forgot to let  dr knapp know that she needed a refill on her bayer test strips and her bayer lancets, she needs them sent to [facility] mail delivery.

## 2015-07-27 NOTE — Telephone Encounter (Signed)
Yes  I entered orders

## 2015-07-30 MED ORDER — GLUCOSE BLOOD VI STRP: ORAL_STRIP | Status: DC

## 2015-07-30 MED ORDER — BAYER MICROLET LANCETS MISC: Status: DC

## 2015-07-30 NOTE — Telephone Encounter (Signed)
Done

## 2015-08-03 ENCOUNTER — Telehealth: Payer: Self-pay | Admitting: Family Medicine

## 2015-08-03 NOTE — Telephone Encounter (Signed)
Rcvd RX request for [employer] Kit, [employer] True Metrix Test Strip (50), Trueplus 33G Lancets along with a note stating that current RX for [employer] Next requires a PA is the reason that this RX is needed. Form placed in Dr. Johnsie Kindred folder to be signed for approval.

## 2015-08-06 NOTE — Telephone Encounter (Signed)
FFO, please fax back 

## 2015-08-13 ENCOUNTER — Encounter: Payer: Self-pay | Admitting: Family Medicine

## 2015-08-13 ENCOUNTER — Ambulatory Visit (INDEPENDENT_AMBULATORY_CARE_PROVIDER_SITE_OTHER): Payer: Commercial Managed Care - HMO | Admitting: Family Medicine

## 2015-08-13 VITALS — BP 140/80 | HR 84 | Temp 98.7°F | Ht 64.0 in | Wt 250.6 lb

## 2015-08-13 DIAGNOSIS — J029 Acute pharyngitis, unspecified: Secondary | ICD-10-CM | POA: Diagnosis not present

## 2015-08-13 DIAGNOSIS — B370 Candidal stomatitis: Secondary | ICD-10-CM

## 2015-08-13 DIAGNOSIS — J449 Chronic obstructive pulmonary disease, unspecified: Secondary | ICD-10-CM

## 2015-08-13 DIAGNOSIS — J0100 Acute maxillary sinusitis, unspecified: Secondary | ICD-10-CM

## 2015-08-13 DIAGNOSIS — H6691 Otitis media, unspecified, right ear: Secondary | ICD-10-CM

## 2015-08-13 LAB — POCT RAPID STREP A (OFFICE): Rapid Strep A Screen: NEGATIVE

## 2015-08-13 MED ORDER — NYSTATIN 100000 UNIT/ML MT SUSP: 5.0000 mL | Freq: Four times a day (QID) | OROMUCOSAL | Status: DC

## 2015-08-13 MED ORDER — AMOXICILLIN 500 MG PO TABS: 1000.0000 mg | ORAL_TABLET | Freq: Two times a day (BID) | ORAL | Status: DC

## 2015-08-13 NOTE — Progress Notes (Signed)
Chief Complaint  Patient presents with  . Cough    and chest congestion, says her throat is extremely sore, HA, nasal congestio, right ear pain, lots of burning in her sinuses and blowing out clots of blood when she blows her nose. Mucus is yellow in color. No fevers. Started this past Saturday. Not getting much sleep as whens he lays down can't stop coughing.   . other    has been using OTC cough medicine (clear liquid) for HBP and diabetes, unsure of name but NOT Corcidin HBP.   Three days ago she had a slight cough and some fatigue, much worse 2 days ago--headache, sore throat, cough. She is complaining of right ear pain since yesterday. Nasal drainage is bloody, slightly yellow, sometimes clear.  She has frontal headache.  She is having burning in the nose and in both cheeks, right worse than left.  She has postnasal drainage and sore throat.  Coughing up phlegm--hasn't looked at the color.  She has checked her pulse ox, and was 96%. Denies shortness of breath, hasn't needed rescue inhaler.  Occasional wheezing noted.  Denies any known sick contacts.  PMH, PSH, SH reviewed  Outpatient Encounter Prescriptions as of 08/13/2015  Medication Sig Note  . aspirin EC 81 MG tablet Take 81 mg by mouth daily.     Marland Kitchen BAYER MICROLET LANCETS lancets Use as instructed   . bisoprolol-hydrochlorothiazide (ZIAC) 10-6.25 MG tablet Take 1 tablet by mouth daily.   . Calcium Carbonate-Vitamin D (CALCIUM 600+D) 600-400 MG-UNIT per tablet Take 1 tablet by mouth daily.    . cetirizine (KLS ALLER-TEC) 10 MG tablet Take 10 mg by mouth daily.   . Cholecalciferol (VITAMIN D) 2000 UNITS tablet Take 4,000 Units by mouth daily.   Marland Kitchen glucose blood (BAYER CONTOUR NEXT TEST) test strip Use once daily   . metFORMIN (GLUCOPHAGE-XR) 500 MG 24 hr tablet Take 3 tablets (1,500 mg total) by mouth daily.   . Multiple Vitamins-Minerals (CENTRUM SILVER PO) Take 1 tablet by mouth.     . Probiotic Product (PHILLIPS COLON HEALTH PO) Take  1 capsule by mouth daily.   . simvastatin (ZOCOR) 20 MG tablet Take 1 tablet (20 mg total) by mouth at bedtime.   . valACYclovir (VALTREX) 500 MG tablet Take 1 tablet (500 mg total) by mouth daily.   Marland Kitchen acetaminophen (TYLENOL) 325 MG tablet Take 650 mg by mouth every 6 (six) hours as needed for mild pain, fever or headache. Reported on 08/13/2015 05/15/2014: Uses prn for hand arthritis pain  . albuterol (PROVENTIL) (2.5 MG/3ML) 0.083% nebulizer solution Take 3 mLs (2.5 mg total) by nebulization every 4 (four) hours as needed for wheezing or shortness of breath. (Patient not taking: Reported on 07/26/2015)   . amoxicillin (AMOXIL) 500 MG tablet Take 2 tablets (1,000 mg total) by mouth 2 (two) times daily.   . budesonide-formoterol (SYMBICORT) 160-4.5 MCG/ACT inhaler Inhale 2 puffs into the lungs 2 (two) times daily. (Patient not taking: Reported on 08/13/2015) 07/26/2015: Not taking; on study drug  . nystatin (MYCOSTATIN) 100000 UNIT/ML suspension Take 5 mLs (500,000 Units total) by mouth 4 (four) times daily.   Marland Kitchen PROAIR HFA 108 (90 BASE) MCG/ACT inhaler INHALE 2 PUFFS INTO THE LUNGS EVERY 6 (SIX) HOURS AS NEEDED FOR WHEEZING. (Patient not taking: Reported on 08/13/2015) 07/26/2015: Uses 2x/week, prior to grocery shopping, and as needed   No facility-administered encounter medications on file as of 08/13/2015.   (amoxil and nystatin rx'd today, not prior to visit).  Allergies  Allergen Reactions  . Lisinopril Shortness Of Breath and Rash  . Contrast Media [Iodinated Diagnostic Agents] Other (See Comments)    Could not breath  . Iohexol Other (See Comments)    Immediately could not breathe  . Latex   . Levofloxacin Other (See Comments)    insomnia  . Sulfa Antibiotics Other (See Comments)    Historical from mother, pt states that mother says she almost died from this drug  . Codeine Nausea Only, Anxiety and Other (See Comments)    insomnia  . Lipitor [Atorvastatin Calcium] Rash    ROS: No fever,  chills, nausea, vomiting.  She has slight diarrhea since increasing the metformin to 3/day.  Denies rashes, no bleeding other than from the nose. +URI symptoms, see HPI  PHYSICAL EXAM: BP 140/80 mmHg  Pulse 84  Temp(Src) 98.7 F (37.1 C) (Tympanic)  Ht 5\' 4"  (1.626 m)  Wt 250 lb 9.6 oz (113.671 kg)  BMI 42.99 kg/m2  Well developed, pleasant, well-appearing female with occasional sniffle, occasional dry cough, speaking easily, in no distress HEENT: PERRL, EOMI, conjunctiva clear.  TM and EAC normal on the left. R TM is bulging, white/yellow behind it, slightly pink. Nasal mucosa is mildly edematous, with thick yellow crusting on the left. No recent bleeding noted.  Mildly tender at right maxillary sinus.  OP show some mild erythema and white on the soft palate just above the tonsillar area. Otherwise normal. Neck: no lymphadenopathy or mass Heart: regular rate and rhythm with murmur, unchanged Lungs: clear bilaterally, fair air movement, no wheezes, rales, ronchi Skin: normal turgor, no rash Neuro: alert and oriented. Cranial nerves intact. Normal gait Psych: normal mood, affect, hygiene and grooming.  Just slightly anxious (worries about getting pneumonia again).  Rapid strep negative  ASSESSMENT/PLAN:  Acute right otitis media, recurrence not specified, unspecified otitis media type - Plan: amoxicillin (AMOXIL) 500 MG tablet  Sore throat - suspect from URI/PND, also thrush - Plan: Rapid Strep A  Acute maxillary sinusitis, recurrence not specified - Plan: amoxicillin (AMOXIL) 500 MG tablet  Thrush, oral - Plan: nystatin (MYCOSTATIN) 100000 UNIT/ML suspension  COPD without exacerbation ([place])   You have a right ear infection and likely sinus infection. Take the antibiotics as prescribed. Drink plenty of water. Use Coricidin HBP to help with nasal congestion, and hopefully lessen the amount that drains in the throat, helping with the cough. Take Mucinex DM (tablets, 12  hour). Call us if you still need additional cough medication (we briefly discussed tessalon perles) Let us know if your symptoms aren't improving, ie call by Friday if [place]. Otherwise, expect you to be feeling much better by early next week. I think you might also have some thrush, contributing to your sore throat.  Use the Nystatin as directed. I'm not sure if your study medication contains steroid or not--you might want to rinse after using inhaler, just to be safe.

## 2015-08-13 NOTE — Patient Instructions (Signed)
You have a right ear infection and likely sinus infection. Take the antibiotics as prescribed. Drink plenty of water. Use Coricidin HBP to help with nasal congestion, and hopefully lessen the amount that drains in the throat, helping with the cough. Take Mucinex DM (tablets, 12 hour). Call us if you still need additional cough medication (we briefly discussed tessalon perles) Let us know if your symptoms aren't improving, ie call by Friday if [place]. Otherwise, expect you to be feeling much better by early next week. I think you might also have some thrush, contributing to your sore throat.  Use the Nystatin as directed. I'm not sure if your study medication contains steroid or not--you might want to rinse after using inhaler, just to be safe.  Thrush, Adult Ritta Slot, also called oral candidiasis, is a fungal infection that develops in the mouth and throat and on the tongue. It causes white patches to form on the mouth and tongue. Ritta Slot is most common in older adults, but it can occur at any age.  Many cases of thrush are mild, but this infection can also be more serious. Ritta Slot can be a recurring problem for people who have chronic illnesses or who take medicines that limit the body's ability to fight infection. Because these people have difficulty fighting infections, the fungus that causes thrush can spread throughout the body. This can cause life-threatening blood or organ infections. CAUSES  Ritta Slot is usually caused by a yeast called Candida albicans. This fungus is normally present in small amounts in the mouth and on other mucous membranes. It usually causes no harm. However, when conditions are present that allow the fungus to grow uncontrolled, it invades surrounding tissues and becomes an infection. Less often, other Candida species can also lead to thrush.  RISK FACTORS Ritta Slot is more likely to develop in the following people:  People with an impaired ability to fight infection (weakened  immune system).   Older adults.   People with HIV.   People with diabetes.   People with dry mouth (xerostomia).   Pregnant women.   People with poor dental care, especially those who have false teeth.   People who use antibiotic medicines.  SIGNS AND SYMPTOMS  Ritta Slot can be a mild infection that causes no symptoms. If symptoms develop, they may include:   A burning feeling in the mouth and throat. This can occur at the start of a thrush infection.   White patches that adhere to the mouth and tongue. The tissue around the patches may be red, raw, and painful. If rubbed (during tooth brushing, for example), the patches and the tissue of the mouth may bleed easily.   A bad taste in the mouth or difficulty tasting foods.   Cottony feeling in the mouth.   Pain during eating and swallowing. DIAGNOSIS  Your health care provider can usually diagnose thrush by looking in your mouth and asking you questions about your health.  TREATMENT  Medicines that help prevent the growth of fungi (antifungals) are the standard treatment for thrush. These medicines are either applied directly to the affected area (topical) or swallowed (oral). The treatment will depend on the severity of the condition.  Mild Thrush Mild cases of thrush may clear up with the use of an antifungal mouth rinse or lozenges. Treatment usually lasts about 14 days.  Moderate to Severe Thrush  More severe thrush infections that have spread to the esophagus are treated with an oral antifungal medicine. A topical antifungal medicine may also  be used.   For some severe infections, a treatment period longer than 14 days may be needed.   Oral antifungal medicines are almost never used during pregnancy because the fetus may be harmed. However, if a pregnant woman has a rare, severe thrush infection that has spread to her blood, oral antifungal medicines may be used. In this case, the risk of harm to the mother and  fetus from the severe thrush infection may be greater than the risk posed by the use of antifungal medicines.  Persistent or Recurrent Thrush For cases of thrush that do not go away or keep coming back, treatment may involve the following:   Treatment may be needed twice as long as the symptoms last.   Treatment will include both oral and topical antifungal medicines.   People with weakened immune systems can take an antifungal medicine on a continuous basis to prevent thrush infections.  It is important to treat conditions that make you more likely to get thrush, such as diabetes or HIV.  HOME CARE INSTRUCTIONS   Only take over-the-counter or prescription medicine as directed by your health care provider. Talk to your health care provider about an over-the-counter medicine called gentian violet, which kills bacteria and fungi.   Eat plain, unflavored yogurt as directed by your health care provider. Check the label to make sure the yogurt contains live cultures. This yogurt can help healthy bacteria grow in the mouth that can stop the growth of the fungus that causes thrush.   Try these measures to help reduce the discomfort of thrush:   Drink cold liquids such as water or iced tea.   Try flavored ice treats or frozen juices.   Eat foods that are easy to swallow, such as gelatin, ice cream, or custard.   If the patches in your mouth are painful, try drinking from a straw.   Rinse your mouth several times a day with a warm saltwater rinse. You can make the saltwater mixture with 1 tsp (6 g) of salt in 8 fl oz (0.2 L) of warm water.   If you wear dentures, remove the dentures before going to bed, brush them vigorously, and soak them in a cleaning solution as directed by your health care provider.   Women who are breastfeeding should clean their nipples with an antifungal medicine as directed by their health care provider. Dry the nipples after breastfeeding. Applying  lanolin-containing body lotion may help relieve nipple soreness.  SEEK MEDICAL CARE IF:  Your symptoms are getting worse or are not improving within 7 days of starting treatment.   You have symptoms of spreading infection, such as white patches on the skin outside of the mouth.   You are nursing and you have redness, burning, or pain in the nipples that is not relieved with treatment.  MAKE SURE YOU:  Understand these instructions.  Will watch your condition.  Will get help right away if you are not doing well or get worse.   This information is not intended to replace advice given to you by your health care provider. Make sure you discuss any questions you have with your health care provider.   Document Released: 03/11/2004 Document Revised: 07/07/2014 Document Reviewed: 01/17/2013 Elsevier Interactive Patient Education [employer].

## 2015-08-14 ENCOUNTER — Other Ambulatory Visit: Payer: Self-pay

## 2015-08-14 DIAGNOSIS — Z9011 Acquired absence of right breast and nipple: Secondary | ICD-10-CM

## 2015-08-14 DIAGNOSIS — Z1231 Encounter for screening mammogram for malignant neoplasm of breast: Secondary | ICD-10-CM

## 2015-08-16 ENCOUNTER — Encounter (HOSPITAL_COMMUNITY): Payer: Self-pay

## 2015-08-16 ENCOUNTER — Inpatient Hospital Stay (HOSPITAL_COMMUNITY)
Admission: EM | Admit: 2015-08-16 | Discharge: 2015-08-18 | DRG: 190 | Disposition: A | Discharge status: Home / Self Care | Payer: Commercial Managed Care - HMO | Attending: Internal Medicine | Admitting: Internal Medicine

## 2015-08-16 ENCOUNTER — Emergency Department (HOSPITAL_COMMUNITY): Payer: Commercial Managed Care - HMO

## 2015-08-16 DIAGNOSIS — J441 Chronic obstructive pulmonary disease with (acute) exacerbation: Secondary | ICD-10-CM | POA: Diagnosis not present

## 2015-08-16 DIAGNOSIS — Z833 Family history of diabetes mellitus: Secondary | ICD-10-CM | POA: Diagnosis not present

## 2015-08-16 DIAGNOSIS — R778 Other specified abnormalities of plasma proteins: Secondary | ICD-10-CM | POA: Diagnosis present

## 2015-08-16 DIAGNOSIS — R7989 Other specified abnormal findings of blood chemistry: Secondary | ICD-10-CM | POA: Diagnosis not present

## 2015-08-16 DIAGNOSIS — Z794 Long term (current) use of insulin: Secondary | ICD-10-CM | POA: Diagnosis not present

## 2015-08-16 DIAGNOSIS — Z8249 Family history of ischemic heart disease and other diseases of the circulatory system: Secondary | ICD-10-CM | POA: Diagnosis not present

## 2015-08-16 DIAGNOSIS — Z9049 Acquired absence of other specified parts of digestive tract: Secondary | ICD-10-CM

## 2015-08-16 DIAGNOSIS — I350 Nonrheumatic aortic (valve) stenosis: Secondary | ICD-10-CM | POA: Diagnosis present

## 2015-08-16 DIAGNOSIS — E119 Type 2 diabetes mellitus without complications: Secondary | ICD-10-CM | POA: Diagnosis present

## 2015-08-16 DIAGNOSIS — E669 Obesity, unspecified: Secondary | ICD-10-CM

## 2015-08-16 DIAGNOSIS — Z853 Personal history of malignant neoplasm of breast: Secondary | ICD-10-CM | POA: Diagnosis not present

## 2015-08-16 DIAGNOSIS — I5033 Acute on chronic diastolic (congestive) heart failure: Secondary | ICD-10-CM | POA: Diagnosis not present

## 2015-08-16 DIAGNOSIS — D696 Thrombocytopenia, unspecified: Secondary | ICD-10-CM | POA: Diagnosis present

## 2015-08-16 DIAGNOSIS — Z9011 Acquired absence of right breast and nipple: Secondary | ICD-10-CM

## 2015-08-16 DIAGNOSIS — R0602 Shortness of breath: Secondary | ICD-10-CM | POA: Diagnosis not present

## 2015-08-16 DIAGNOSIS — J9601 Acute respiratory failure with hypoxia: Secondary | ICD-10-CM | POA: Diagnosis present

## 2015-08-16 DIAGNOSIS — Z6841 Body Mass Index (BMI) 40.0 and over, adult: Secondary | ICD-10-CM | POA: Diagnosis not present

## 2015-08-16 DIAGNOSIS — Z87891 Personal history of nicotine dependence: Secondary | ICD-10-CM | POA: Diagnosis not present

## 2015-08-16 DIAGNOSIS — B370 Candidal stomatitis: Secondary | ICD-10-CM | POA: Diagnosis present

## 2015-08-16 DIAGNOSIS — R000 Tachycardia, unspecified: Secondary | ICD-10-CM | POA: Diagnosis not present

## 2015-08-16 DIAGNOSIS — I10 Essential (primary) hypertension: Secondary | ICD-10-CM | POA: Diagnosis not present

## 2015-08-16 DIAGNOSIS — E785 Hyperlipidemia, unspecified: Secondary | ICD-10-CM | POA: Diagnosis present

## 2015-08-16 DIAGNOSIS — E1169 Type 2 diabetes mellitus with other specified complication: Secondary | ICD-10-CM

## 2015-08-16 LAB — CBC WITH DIFFERENTIAL/PLATELET
Basophils Absolute: 0 10*3/uL (ref 0.0–0.1)
Basophils Relative: 1 %
Eosinophils Absolute: 0.1 10*3/uL (ref 0.0–0.7)
Eosinophils Relative: 1 %
HCT: 43 % (ref 36.0–46.0)
Hemoglobin: 14 g/dL (ref 12.0–15.0)
Lymphocytes Relative: 20 %
Lymphs Abs: 1.4 10*3/uL (ref 0.7–4.0)
MCH: 30.9 pg (ref 26.0–34.0)
MCHC: 32.6 g/dL (ref 30.0–36.0)
MCV: 94.9 fL (ref 78.0–100.0)
Monocytes Absolute: 0.2 10*3/uL (ref 0.1–1.0)
Monocytes Relative: 3 %
Neutro Abs: 5.4 10*3/uL (ref 1.7–7.7)
Neutrophils Relative %: 75 %
Platelets: 142 10*3/uL — ABNORMAL LOW (ref 150–400)
RBC: 4.53 MIL/uL (ref 3.87–5.11)
RDW: 14.2 % (ref 11.5–15.5)
WBC: 7.2 10*3/uL (ref 4.0–10.5)

## 2015-08-16 LAB — BRAIN NATRIURETIC PEPTIDE: B Natriuretic Peptide: 34.7 pg/mL (ref 0.0–100.0)

## 2015-08-16 LAB — GLUCOSE, CAPILLARY
Glucose-Capillary: 252 mg/dL — ABNORMAL HIGH (ref 65–99)
Glucose-Capillary: 258 mg/dL — ABNORMAL HIGH (ref 65–99)
Glucose-Capillary: 180 mg/dL — ABNORMAL HIGH (ref 65–99)

## 2015-08-16 LAB — INFLUENZA PANEL BY PCR (TYPE A & B)
H1N1 flu by pcr: NOT DETECTED
Influenza A By PCR: NEGATIVE
Influenza B By PCR: NEGATIVE

## 2015-08-16 LAB — BASIC METABOLIC PANEL
Anion gap: 12 (ref 5–15)
BUN: 25 mg/dL — ABNORMAL HIGH (ref 6–20)
CO2: 25 mmol/L (ref 22–32)
Calcium: 9.6 mg/dL (ref 8.9–10.3)
Chloride: 104 mmol/L (ref 101–111)
Creatinine, Ser: 0.95 mg/dL (ref 0.44–1.00)
GFR calc Af Amer: >60 mL/min (ref >60)
GFR calc non Af Amer: 60 mL/min — ABNORMAL LOW (ref >60)
Glucose, Bld: 200 mg/dL — ABNORMAL HIGH (ref 65–99)
Potassium: 4.4 mmol/L (ref 3.5–5.1)
Sodium: 141 mmol/L (ref 135–145)

## 2015-08-16 LAB — TROPONIN I
Troponin I: 0.06 ng/mL — ABNORMAL HIGH (ref <0.031)
Troponin I: 0.06 ng/mL — ABNORMAL HIGH (ref <0.031)
Troponin I: 0.05 ng/mL — ABNORMAL HIGH (ref <0.031)
Troponin I: 0.04 ng/mL — ABNORMAL HIGH (ref <0.031)

## 2015-08-16 LAB — MAGNESIUM: Magnesium: 1.7 mg/dL (ref 1.7–2.4)

## 2015-08-16 LAB — TSH: TSH: 1.268 u[IU]/mL (ref 0.350–4.500)

## 2015-08-16 MED ORDER — METHYLPREDNISOLONE SODIUM SUCC 125 MG IJ SOLR
125.0000 mg | Freq: Once | INTRAMUSCULAR | Status: AC
  Administered 2015-08-16: 125 mg via INTRAVENOUS
  Filled 2015-08-16: qty 2

## 2015-08-16 MED ORDER — NYSTATIN 100000 UNIT/ML MT SUSP
5.0000 mL | Freq: Four times a day (QID) | OROMUCOSAL | Status: DC
  Administered 2015-08-16 – 2015-08-18 (×7): 500000 [IU] via ORAL
  Filled 2015-08-16 (×8): qty 5

## 2015-08-16 MED ORDER — INSULIN ASPART 100 UNIT/ML ~~LOC~~ SOLN
0.0000 [IU] | Freq: Three times a day (TID) | SUBCUTANEOUS | Status: DC
  Administered 2015-08-16 (×2): 8 [IU] via SUBCUTANEOUS
  Administered 2015-08-17: 3 [IU] via SUBCUTANEOUS
  Administered 2015-08-17: 8 [IU] via SUBCUTANEOUS
  Administered 2015-08-17 – 2015-08-18 (×2): 5 [IU] via SUBCUTANEOUS
  Administered 2015-08-18: 11 [IU] via SUBCUTANEOUS

## 2015-08-16 MED ORDER — ENOXAPARIN SODIUM 40 MG/0.4ML ~~LOC~~ SOLN
40.0000 mg | SUBCUTANEOUS | Status: DC
  Administered 2015-08-16 – 2015-08-17 (×2): 40 mg via SUBCUTANEOUS
  Filled 2015-08-16 (×3): qty 0.4

## 2015-08-16 MED ORDER — IPRATROPIUM-ALBUTEROL 0.5-2.5 (3) MG/3ML IN SOLN: 3.0000 mL | RESPIRATORY_TRACT | Status: DC | PRN

## 2015-08-16 MED ORDER — ALBUTEROL (5 MG/ML) CONTINUOUS INHALATION SOLN
10.0000 mg/h | INHALATION_SOLUTION | Freq: Once | RESPIRATORY_TRACT | Status: AC
  Administered 2015-08-16: 10 mg/h via RESPIRATORY_TRACT
  Filled 2015-08-16: qty 20

## 2015-08-16 MED ORDER — AZITHROMYCIN 500 MG IV SOLR
500.0000 mg | INTRAVENOUS | Status: DC
  Administered 2015-08-16 – 2015-08-18 (×3): 500 mg via INTRAVENOUS
  Filled 2015-08-16 (×3): qty 500

## 2015-08-16 MED ORDER — ACETAMINOPHEN 325 MG PO TABS
650.0000 mg | ORAL_TABLET | Freq: Four times a day (QID) | ORAL | Status: DC | PRN
  Administered 2015-08-16 – 2015-08-17 (×2): 650 mg via ORAL
  Filled 2015-08-16 (×2): qty 2

## 2015-08-16 MED ORDER — METHYLPREDNISOLONE SODIUM SUCC 125 MG IJ SOLR
80.0000 mg | Freq: Three times a day (TID) | INTRAMUSCULAR | Status: DC
  Administered 2015-08-16 – 2015-08-17 (×3): 80 mg via INTRAVENOUS
  Filled 2015-08-16 (×3): qty 2

## 2015-08-16 MED ORDER — ALBUTEROL SULFATE (2.5 MG/3ML) 0.083% IN NEBU: 2.5000 mg | INHALATION_SOLUTION | RESPIRATORY_TRACT | Status: DC | PRN

## 2015-08-16 MED ORDER — OXYCODONE HCL 5 MG PO TABS
5.0000 mg | ORAL_TABLET | ORAL | Status: DC | PRN
  Administered 2015-08-17: 5 mg via ORAL
  Filled 2015-08-16: qty 1

## 2015-08-16 MED ORDER — FUROSEMIDE 40 MG PO TABS
40.0000 mg | ORAL_TABLET | Freq: Every day | ORAL | Status: AC
  Administered 2015-08-16 – 2015-08-17 (×2): 40 mg via ORAL
  Filled 2015-08-16 (×2): qty 1

## 2015-08-16 MED ORDER — ACETAMINOPHEN 650 MG RE SUPP: 650.0000 mg | Freq: Four times a day (QID) | RECTAL | Status: DC | PRN

## 2015-08-16 MED ORDER — BUDESONIDE-FORMOTEROL FUMARATE 160-4.5 MCG/ACT IN AERO: 2.0000 | INHALATION_SPRAY | Freq: Two times a day (BID) | RESPIRATORY_TRACT | Status: DC

## 2015-08-16 MED ORDER — SODIUM CHLORIDE 0.9% FLUSH
3.0000 mL | Freq: Two times a day (BID) | INTRAVENOUS | Status: DC
  Administered 2015-08-17: 3 mL via INTRAVENOUS

## 2015-08-16 MED ORDER — DEXTROSE 5 % IV SOLN
1.0000 g | INTRAVENOUS | Status: DC
  Administered 2015-08-16 – 2015-08-18 (×3): 1 g via INTRAVENOUS
  Filled 2015-08-16 (×3): qty 10

## 2015-08-16 MED ORDER — STUDY - INVESTIGATIONAL MEDICATION
2.0000 | Freq: Two times a day (BID) | Status: DC
  Administered 2015-08-16 – 2015-08-18 (×3): 2 via RESPIRATORY_TRACT

## 2015-08-16 MED ORDER — INSULIN ASPART 100 UNIT/ML ~~LOC~~ SOLN
0.0000 [IU] | Freq: Every day | SUBCUTANEOUS | Status: DC
  Administered 2015-08-17: 2 [IU] via SUBCUTANEOUS

## 2015-08-16 MED ORDER — LEVALBUTEROL HCL 0.63 MG/3ML IN NEBU: 0.6300 mg | INHALATION_SOLUTION | Freq: Four times a day (QID) | RESPIRATORY_TRACT | Status: DC | PRN

## 2015-08-16 MED ORDER — ASPIRIN EC 81 MG PO TBEC
81.0000 mg | DELAYED_RELEASE_TABLET | Freq: Every day | ORAL | Status: DC
  Administered 2015-08-16 – 2015-08-18 (×3): 81 mg via ORAL
  Filled 2015-08-16 (×3): qty 1

## 2015-08-16 MED ORDER — VALACYCLOVIR HCL 500 MG PO TABS
500.0000 mg | ORAL_TABLET | Freq: Every day | ORAL | Status: DC
  Administered 2015-08-16 – 2015-08-18 (×3): 500 mg via ORAL
  Filled 2015-08-16 (×3): qty 1

## 2015-08-16 MED ORDER — BISOPROLOL-HYDROCHLOROTHIAZIDE 10-6.25 MG PO TABS
1.0000 | ORAL_TABLET | Freq: Every day | ORAL | Status: DC
  Administered 2015-08-16 – 2015-08-18 (×3): 1 via ORAL
  Filled 2015-08-16 (×3): qty 1

## 2015-08-16 MED ORDER — INSULIN GLARGINE 100 UNIT/ML ~~LOC~~ SOLN
25.0000 [IU] | Freq: Every day | SUBCUTANEOUS | Status: DC
  Administered 2015-08-16 – 2015-08-18 (×3): 25 [IU] via SUBCUTANEOUS
  Filled 2015-08-16 (×3): qty 0.25

## 2015-08-16 MED ORDER — ONDANSETRON HCL 4 MG/2ML IJ SOLN: 4.0000 mg | Freq: Four times a day (QID) | INTRAMUSCULAR | Status: DC | PRN

## 2015-08-16 MED ORDER — SODIUM CHLORIDE 0.9 % IV SOLN
INTRAVENOUS | Status: DC
  Administered 2015-08-16 – 2015-08-17 (×3): via INTRAVENOUS

## 2015-08-16 MED ORDER — SALINE SPRAY 0.65 % NA SOLN
1.0000 | NASAL | Status: DC | PRN
  Filled 2015-08-16: qty 44

## 2015-08-16 MED ORDER — SENNOSIDES-DOCUSATE SODIUM 8.6-50 MG PO TABS
1.0000 | ORAL_TABLET | Freq: Every evening | ORAL | Status: DC | PRN
Start: 2015-08-16 — End: 2015-08-18

## 2015-08-16 MED ORDER — FLUTICASONE PROPIONATE 50 MCG/ACT NA SUSP
2.0000 | Freq: Every day | NASAL | Status: DC
  Administered 2015-08-17 – 2015-08-18 (×2): 2 via NASAL
  Filled 2015-08-16 (×2): qty 16

## 2015-08-16 MED ORDER — SIMVASTATIN 20 MG PO TABS
20.0000 mg | ORAL_TABLET | Freq: Every day | ORAL | Status: DC
  Administered 2015-08-16 – 2015-08-17 (×2): 20 mg via ORAL
  Filled 2015-08-16 (×2): qty 1

## 2015-08-16 MED ORDER — ONDANSETRON HCL 4 MG PO TABS: 4.0000 mg | ORAL_TABLET | Freq: Four times a day (QID) | ORAL | Status: DC | PRN

## 2015-08-16 MED ORDER — LORATADINE 10 MG PO TABS
10.0000 mg | ORAL_TABLET | Freq: Every day | ORAL | Status: DC
  Administered 2015-08-16 – 2015-08-18 (×3): 10 mg via ORAL
  Filled 2015-08-16 (×3): qty 1

## 2015-08-16 MED ORDER — BENZONATATE 100 MG PO CAPS
200.0000 mg | ORAL_CAPSULE | Freq: Three times a day (TID) | ORAL | Status: DC | PRN
  Administered 2015-08-16 – 2015-08-17 (×3): 200 mg via ORAL
  Filled 2015-08-16 (×3): qty 2

## 2015-08-16 NOTE — ED Notes (Signed)
Bed: [id number] Expected date:  Expected time:  Means of arrival:  Comments: EMS 70 yo female from home resp distress, cold and sinus infection COPD/albuterol 10 mg, atrovent, solumedrol

## 2015-08-16 NOTE — Progress Notes (Addendum)
[facility] patient received signout from Dr. Betsey Holiday at [facility] Shannon Schultz is a 70 year old female with past medical history significant for hypertension, diabetes, breast cancer, history of tobacco abuse; who presents with complaints of acute shortness of breath and cough. Patient reports progressively worsening cough prior to arrival. Patient is currently a part of a double-blind studymedication for new COPD medication. On arrival patient was found to be wheezing bilaterally with desats as low as 84%. Given 125 mg Solu-Medrol and breathing treatments with improvement of O2 sats with nasal cannula oxygen. Chest x-ray suggestive of COPD with no signs of acute infiltrate.

## 2015-08-16 NOTE — ED Notes (Signed)
Attempted to call report. Floor not ready for pt

## 2015-08-16 NOTE — ED Notes (Signed)
BIB EMS from home c/o respiratory distress an hour ago. Used rescue inhaler with no improvement. Given Duoneb 10mg  albuterol 1mg  atrovent 125 solumedrol en route. Reports improvement post EMS medication interventions. Complaining of cough, sinus infection, and cold. Taking amoxicillin at home. Denies pain, fever, nausea, vomiting. Sinus tach on the monitor.   EMS vitals 144/88, 120HR, 20RR, 99% on neb, CBG 125

## 2015-08-16 NOTE — Progress Notes (Signed)
Nutrition Brief Note  RD consulted via COPD gold protocol.   Patient reports starting [employer] recently, if she has had any weight loss she contributes it to that. Pt states good appetite and very hungry as she has not eaten since yesterday.   Wt Readings from Last 15 Encounters:  08/16/15 244 lb 11.2 oz (110.995 kg)  08/13/15 250 lb 9.6 oz (113.671 kg)  07/26/15 253 lb 3.2 oz (114.851 kg)  03/21/15 245 lb (111.131 kg)  03/08/15 245 lb (111.131 kg)  02/06/15 244 lb (110.678 kg)  01/10/15 240 lb (108.863 kg)  10/05/14 242 lb (109.77 kg)  07/06/14 236 lb (107.049 kg)  05/15/14 235 lb (106.595 kg)  07/06/13 224 lb (101.606 kg)  12/29/12 239 lb (108.41 kg)  09/23/12 243 lb (110.224 kg)  06/07/12 242 lb (109.77 kg)  03/03/12 246 lb (111.585 kg)    Body mass index is 41.98 kg/(m^2). Patient meets criteria for morbid obesity based on current BMI.   Current diet order is CHO modified. Ordered lunch meal but has not received it yet. Labs and medications reviewed.   No nutrition interventions warranted at this time. If nutrition issues arise, please re-consult RD.   Shannon Bibles, MS, RD, LDN Pager: [phone] After Hours Pager: [phone]

## 2015-08-16 NOTE — ED Notes (Signed)
O2 sats dropped to 87%. Pt placed on 2L

## 2015-08-16 NOTE — H&P (Addendum)
[facility] History and Physical  Shannon Schultz ATF:[id number] DOB: 1945-09-28 DOA: 08/16/2015  Referring physician:  PCP: [name], MD   Chief Complaint: Shortness of breath   HPI:  70 year old female with a history of emphysema, diabetes mellitus type 2, essential hypertension, aortic stenosis, dyslipidemia, who presents to the ER with a chief complaint of respiratory distress, not improved by her rescue inhaler/amoxicillin which was prescribed on 2/13. Patient recently evaluated by Dr. Tomi Bamberger on 2/13, when she presented with cough, fatigue, frontal headache, sore throat, diagnosed with a right ear infection and sinusitis. Patient describes coughing up yellow-green phlegm, postnasal drip. Pulse ox was 96%, patient was found to have occasional wheezing. Patient denied any sick contacts. Patient prescribed amoxicillin for acute maxillary sinusitis on 2/13. Discharged home on amoxicillin. At that time she was not diagnosed with COPD exacerbation.  Today on 2/16. Patient was brought in by ambulance for shortness of breath, patient also described having thrush from amoxicillin. Patient also is currently on a double blind study for her COPD.She did have evidence of tachypnea upon arrival. She was administered continuous nebulization and Solu-Medrol. She is anxious and tachycardic after the nebulizer, but still has some mild bronchospasm. She was ambulated in the hall and her oxygen saturation dropped to 84%. Patient is being admitted for acute hypoxemic respiratory failure.    Review of Systems: negative for the following  Constitutional: Negative for fever.  HENT: Positive for ear pain and sinus pressure.  Respiratory: Positive for cough, shortness of breath and wheezing.  Cardiovascular: Denies chest pain, palpitations and leg swelling.  Gastrointestinal: Denies nausea, vomiting, abdominal pain, diarrhea, constipation, blood in stool and abdominal distention.  Genitourinary: Denies  dysuria, urgency, frequency, hematuria, flank pain and difficulty urinating.  Musculoskeletal: Denies myalgias, back pain, joint swelling, arthralgias and gait problem.  Skin: Denies pallor, rash and wound.  Neurological: Denies dizziness, seizures, syncope, weakness, light-headedness, numbness and headaches.  Hematological: Denies adenopathy. Easy bruising, personal or family bleeding history  Psychiatric/Behavioral: Denies suicidal ideation, mood changes, confusion, nervousness, sleep disturbance and agitation       Past Medical History  Diagnosis Date  . Allergy   . Elevated cholesterol   . Hypertension   . Genital HSV     gets on hip  . Diverticulosis   . Cancer ([place]) 1992    R breast, DCIS  . Vitamin D deficiency disease   . Osteopenia   . Migraine   . Elevated liver enzymes     fatty liver per ultrasound per pt  . Impaired glucose tolerance   . Colon polyp   . Pneumonia 06/06/2011  . Type 2 diabetes mellitus ([place])   . HSV (herpes simplex virus) infection     on hip--on daily suppression  . Heart murmur      echo 05/2011 mild aortic stenosis  . Family history of malignant neoplasm of breast   . FHx: BRCA2 gene positive     sister with BRCA2 mutation (pt tested NEGATIVE)     Past Surgical History  Procedure Laterality Date  . Mastectomy Right 1992    R breast for cancer  . Colonoscopy  2009, 08/2010    Dr. Collene Mares  . Cholecystectomy  2003  . Hernia repair  2706    umbilical      Social History:  reports that she quit smoking about 4 years ago. Her smoking use included Cigarettes. She has a 46 pack-year smoking history. She has never used smokeless tobacco. She reports that  she drinks alcohol. She reports that she does not use illicit drugs.    Allergies  Allergen Reactions  . Lisinopril Shortness Of Breath and Rash  . Contrast Media [Iodinated Diagnostic Agents] Other (See Comments)    Could not breath  . Iohexol Other (See Comments)    Immediately could  not breathe  . Latex   . Levofloxacin Other (See Comments)    insomnia  . Sulfa Antibiotics Other (See Comments)    Historical from mother, pt states that mother says she almost died from this drug  . Codeine Nausea Only, Anxiety and Other (See Comments)    insomnia  . Lipitor [Atorvastatin Calcium] Rash    Family History  Problem Relation Age of Onset  . Heart disease Father   . Diabetes Father   . Hypertension Father   . Asthma Sister   . Allergies Sister   . Hyperlipidemia Sister   . Heart disease Mother     tachycardia  . Asthma Sister   . Allergies Sister   . Hyperlipidemia Sister   . Breast cancer Sister 30    BRCA2 positive; metastatic to bones at age 63  . Tuberculosis Maternal Grandfather   . Cancer Maternal Grandmother     deceased 76; unk. primary; possibly stomach  . Cancer Other     female cancers, bone cancer  . Stroke Paternal Grandmother 87    died of cerebral hemorrhage         Prior to Admission medications   Medication Sig Start Date End Date Taking? Authorizing Provider  acetaminophen (TYLENOL) 325 MG tablet Take 650 mg by mouth every 6 (six) hours as needed for mild pain, fever or headache. Reported on 08/13/2015   Yes Historical Provider, MD  albuterol (PROVENTIL) (2.5 MG/3ML) 0.083% nebulizer solution Take 3 mLs (2.5 mg total) by nebulization every 4 (four) hours as needed for wheezing or shortness of breath. 08/30/13  Yes Domenic Moras, PA-C  amoxicillin (AMOXIL) 500 MG tablet Take 2 tablets (1,000 mg total) by mouth 2 (two) times daily. 08/13/15  Yes [name], MD  aspirin EC 81 MG tablet Take 81 mg by mouth daily.     Yes Historical Provider, MD  BAYER MICROLET LANCETS lancets Use as instructed 07/30/15  Yes [name], MD  bisoprolol-hydrochlorothiazide [facility]) 10-6.25 MG tablet Take 1 tablet by mouth daily. 07/26/15  Yes [name], MD  Calcium Carbonate-Vitamin D (CALCIUM 600+D) 600-400 MG-UNIT per tablet Take 1 tablet by mouth daily.    Yes Historical  Provider, MD  cetirizine (KLS ALLER-TEC) 10 MG tablet Take 10 mg by mouth daily.   Yes Historical Provider, MD  Chlorpheniramine-Acetaminophen (CORICIDIN HBP COLD/FLU PO) Take 1 tablet by mouth every 8 (eight) hours as needed (cold symptoms).   Yes Historical Provider, MD  Cholecalciferol (VITAMIN D) 2000 UNITS tablet Take 4,000 Units by mouth daily.   Yes Historical Provider, MD  glucose blood (BAYER CONTOUR NEXT TEST) test strip Use once daily 07/30/15  Yes [name], MD  Investigational - Study Medication Inhale 2 puffs into the lungs 2 (two) times daily. Additional Study Details: PT [id number] Rescue inhaler, double blind study   Yes Historical Provider, MD  metFORMIN (GLUCOPHAGE-XR) 500 MG 24 hr tablet Take 3 tablets (1,500 mg total) by mouth daily. Patient taking differently: Take 500 mg by mouth 3 (three) times daily.  07/26/15  Yes [name], MD  Multiple Vitamins-Minerals (CENTRUM SILVER PO) Take 1 tablet by mouth.     Yes Historical Provider,  MD  nystatin (MYCOSTATIN) 100000 UNIT/ML suspension Take 5 mLs (500,000 Units total) by mouth 4 (four) times daily. 08/13/15  Yes [name], MD  Probiotic Product ([place]) Take 1 capsule by mouth daily.   Yes Historical Provider, MD  simvastatin (ZOCOR) 20 MG tablet Take 1 tablet (20 mg total) by mouth at bedtime. 07/26/15  Yes [name], MD  valACYclovir (VALTREX) 500 MG tablet Take 1 tablet (500 mg total) by mouth daily. 07/26/15  Yes [name], MD  budesonide-formoterol (SYMBICORT) 160-4.5 MCG/ACT inhaler Inhale 2 puffs into the lungs 2 (two) times daily. Patient not taking: Reported on 08/13/2015 01/17/15   [name], MD  PROAIR HFA 108 (90 BASE) MCG/ACT inhaler INHALE 2 PUFFS INTO THE LUNGS EVERY 6 (SIX) HOURS AS NEEDED FOR WHEEZING. Patient not taking: Reported on 08/13/2015 12/18/14   [name], MD     Physical Exam: Filed Vitals:   08/16/15 0551 08/16/15 0809 08/16/15 0813 08/16/15 0834  BP:  127/55  127/55  Pulse:  115  115   Temp:  98.1 F (36.7 C)  98.1 F (36.7 C)  TempSrc:  Oral  Oral  Resp:  20  20  Height:    5' 4" (1.626 m)  Weight:   110.995 kg (244 lb 11.2 oz)   SpO2: 84% 96%  96%     Constitutional: Vital signs reviewed. Patient is a well-developed and well-nourished in no acute distress and cooperative with exam. Alert and oriented x3.  Head: Tenderness to palpation of the maxillary sinus area bilaterally  Ear: TM normal bilaterally  Mouth: no erythema or exudates, dry oral mucosa Eyes: PERRL, EOMI, conjunctivae normal, No scleral icterus.  Neck: Supple, Trachea midline normal ROM, No JVD, mass, thyromegaly, or carotid bruit present.  Cardiovascular: RRR, S1 normal, S2 normal, no MRG, pulses symmetric and intact bilaterally  Pulmonary/Chest: Effort normal. She has wheezes. She exhibits no tenderness.  Abdominal: Soft. Non-tender, non-distended, bowel sounds are normal, no masses, organomegaly, or guarding present.  GU: no CVA tenderness Musculoskeletal: No joint deformities, erythema, or stiffness, ROM full and no nontender Ext: no edema and no cyanosis, pulses palpable bilaterally (DP and PT)  Hematology: no cervical, inginal, or axillary adenopathy.  Neurological: A&O x3, Strenght is normal and symmetric bilaterally, cranial nerve II-XII are grossly intact, no focal motor deficit, sensory intact to light touch bilaterally.  Skin: Warm, dry and intact. No rash, cyanosis, or clubbing.  Psychiatric: Normal mood and affect. speech and behavior is normal. Judgment and thought content normal. Cognition and memory are normal.      Data Review   Micro Results No results found for this or any previous visit (from the past 240 hour(s)).  Radiology Reports Dg Chest 2 View  08/16/2015  CLINICAL DATA:  Shortness of breath, chest pressure. History of COPD, cancer. EXAM: CHEST  2 VIEW COMPARISON:  Chest radiograph March 12, 2015 FINDINGS: Cardiomediastinal silhouette is normal. Calcified aortic  knob. The lungs are clear without pleural effusions or focal consolidations. Increased lung volumes with flattened hemidiaphragms compatible with history of COPD. Trachea projects midline and there is no pneumothorax. Soft tissue planes and included osseous structures are non-suspicious. Surgical clips project in RIGHT chest wall. IMPRESSION: COPD, no superimposed acute cardiopulmonary process. Electronically Signed   By: Elon Alas M.D.   On: 08/16/2015 04:26     CBC  Recent Labs Lab 08/16/15 0417  WBC 7.2  HGB 14.0  HCT 43.0  PLT 142*  MCV 94.9  MCH 30.9  MCHC 32.6  RDW 14.2  LYMPHSABS 1.4  MONOABS 0.2  EOSABS 0.1  BASOSABS 0.0    Chemistries   Recent Labs Lab 08/16/15 0417  NA 141  K 4.4  CL 104  CO2 25  GLUCOSE 200*  BUN 25*  CREATININE 0.95  CALCIUM 9.6   ------------------------------------------------------------------------------------------------------------------ estimated creatinine clearance is 68.1 mL/min (by C-G formula based on Cr of 0.95). ------------------------------------------------------------------------------------------------------------------ No results for input(s): HGBA1C in the last 72 hours. ------------------------------------------------------------------------------------------------------------------ No results for input(s): CHOL, HDL, LDLCALC, TRIG, CHOLHDL, LDLDIRECT in the last 72 hours. ------------------------------------------------------------------------------------------------------------------ No results for input(s): TSH, T4TOTAL, T3FREE, THYROIDAB in the last 72 hours.  Invalid input(s): FREET3 ------------------------------------------------------------------------------------------------------------------ No results for input(s): VITAMINB12, FOLATE, FERRITIN, TIBC, IRON, RETICCTPCT in the last 72 hours.  Coagulation profile No results for input(s): INR, PROTIME in the last 168 hours.  No results for input(s):  DDIMER in the last 72 hours.  Cardiac Enzymes  Recent Labs Lab 08/16/15 0417  TROPONINI 0.06*   ------------------------------------------------------------------------------------------------------------------ Invalid input(s): POCBNP   CBG: No results for input(s): GLUCAP in the last 168 hours.    Assessment/Plan Principal Problem:   Acute COPD exacerbation/acute hypoxemic respiratory failure COPD pathway initiated, admit to tele Patient started on Rocephin and azithromycin, for possible acute bronchitis/early pneumonia/acute sinusitis Solu-Medrol 80 mg IV every 8 Rapid strep negative, check influenza PCR, respiratory virus panel    Type 2 diabetes mellitus, controlled (HCC) Hemoglobin A1c 7.2 Continue Accu-Cheks, start patient on sliding scale insulin and hold metformin Start patient on Lantus as the patient is also going to be receiving steroids for her COPD    Essential hypertension, controlled benign-continue ziac    Obesity (BMI 30-39.9)  Oral Thrush-Patient will be provided with nystatin swish and spent as well as fluconazole   acute on chronic diastolic HF grade 1 diastolic dysfunction, LV EF: 60% -  65%, start patient on low dose lasix          Code Status Orders        Start     Ordered   08/16/15 0905  Full code   Continuous     08/16/15 0905    Family Communication: bedside Disposition Plan: admit   Total time spent 55 minutes.Greater than 50% of this time was spent in counseling, explanation of diagnosis, planning of further management, and coordination of care  [facility] Pager [phone]  If 7PM-7AM, please contact night-coverage [email or web address] Password [facility] 08/16/2015, 9:13 AM

## 2015-08-16 NOTE — ED Provider Notes (Signed)
CSN: [id number]     Arrival date & time 08/16/15  0320 History  By signing my name below, I, Helane Gunther, attest that this documentation has been prepared under the direction and in the presence of Orpah Greek, MD. Electronically Signed: Helane Gunther, ED Scribe. 08/16/2015. 3:54 AM.     Chief Complaint  Patient presents with  . Shortness of Breath  . Cough   The history is provided by the patient and the spouse. No language interpreter was used.   HPI Comments: Shannon Schultz is a 70 y.o. female with a PMHx of HTN brought in by ambulance, who presents to the Emergency Department complaining of cough and SOB onset 1 hour PTA. She reports the cough continued to worsen and "all of a sudden there was all this foamy stuff in [her] mouth." She states she then banged on the wall to wake her husband, and EMS was called. He notes pt has a PMHx of PNA that occurred 4 years ago. She states she was seen by her PCP 2 days ago where she was diagnosed with an ear infection, a sinus infection, as well as thrush, and was prescribed amoxicillin. She notes the thrush was from a COPD study she is on, but as it is a double blind study she does not know what medications she is receiving. Pt denies fever and n/v.   Past Medical History  Diagnosis Date  . Allergy   . Elevated cholesterol   . Hypertension   . Genital HSV     gets on hip  . Diverticulosis   . Cancer ([place]) 1992    R breast, DCIS  . Vitamin D deficiency disease   . Osteopenia   . Migraine   . Elevated liver enzymes     fatty liver per ultrasound per pt  . Impaired glucose tolerance   . Colon polyp   . Pneumonia 06/06/2011  . Type 2 diabetes mellitus ([place])   . HSV (herpes simplex virus) infection     on hip--on daily suppression  . Heart murmur      echo 05/2011 mild aortic stenosis  . Family history of malignant neoplasm of breast   . FHx: BRCA2 gene positive     sister with BRCA2 mutation (pt tested NEGATIVE)   Past  Surgical History  Procedure Laterality Date  . Mastectomy Right 1992    R breast for cancer  . Colonoscopy  2009, 08/2010    Dr. Collene Mares  . Cholecystectomy  2003  . Hernia repair  5003    umbilical   Family History  Problem Relation Age of Onset  . Heart disease Father   . Diabetes Father   . Hypertension Father   . Asthma Sister   . Allergies Sister   . Hyperlipidemia Sister   . Heart disease Mother     tachycardia  . Asthma Sister   . Allergies Sister   . Hyperlipidemia Sister   . Breast cancer Sister 79    BRCA2 positive; metastatic to bones at age 56  . Tuberculosis Maternal Grandfather   . Cancer Maternal Grandmother     deceased 13; unk. primary; possibly stomach  . Cancer Other     female cancers, bone cancer  . Stroke Paternal Grandmother 33    died of cerebral hemorrhage   Social History  Substance Use Topics  . Smoking status: Former Smoker -- 1.00 packs/day for 46 years    Types: Cigarettes    Quit date: 05/31/2011  .  Smokeless tobacco: Never Used  . Alcohol Use: 0.0 oz/week    0 Standard drinks or equivalent per week     Comment: rarely, 1 drink per year maybe   OB History    Gravida Para Term Preterm AB TAB SAB Ectopic Multiple Living   '3 3        3     ' Review of Systems  Constitutional: Negative for fever.  HENT: Positive for ear pain and sinus pressure.   Respiratory: Positive for cough, shortness of breath and wheezing.   Gastrointestinal: Negative for nausea and vomiting.  All other systems reviewed and are negative.   Allergies  Lisinopril; Contrast media; Iohexol; Latex; Levofloxacin; Sulfa antibiotics; Codeine; and Lipitor  Home Medications   Prior to Admission medications   Medication Sig Start Date End Date Taking? Authorizing Provider  acetaminophen (TYLENOL) 325 MG tablet Take 650 mg by mouth every 6 (six) hours as needed for mild pain, fever or headache. Reported on 08/13/2015   Yes Historical Provider, MD  albuterol (PROVENTIL) (2.5  MG/3ML) 0.083% nebulizer solution Take 3 mLs (2.5 mg total) by nebulization every 4 (four) hours as needed for wheezing or shortness of breath. 08/30/13  Yes Domenic Moras, PA-C  amoxicillin (AMOXIL) 500 MG tablet Take 2 tablets (1,000 mg total) by mouth 2 (two) times daily. 08/13/15  Yes [name], MD  aspirin EC 81 MG tablet Take 81 mg by mouth daily.     Yes Historical Provider, MD  BAYER MICROLET LANCETS lancets Use as instructed 07/30/15  Yes [name], MD  bisoprolol-hydrochlorothiazide [facility]) 10-6.25 MG tablet Take 1 tablet by mouth daily. 07/26/15  Yes [name], MD  Calcium Carbonate-Vitamin D (CALCIUM 600+D) 600-400 MG-UNIT per tablet Take 1 tablet by mouth daily.    Yes Historical Provider, MD  cetirizine (KLS ALLER-TEC) 10 MG tablet Take 10 mg by mouth daily.   Yes Historical Provider, MD  Chlorpheniramine-Acetaminophen (CORICIDIN HBP COLD/FLU PO) Take 1 tablet by mouth every 8 (eight) hours as needed (cold symptoms).   Yes Historical Provider, MD  Cholecalciferol (VITAMIN D) 2000 UNITS tablet Take 4,000 Units by mouth daily.   Yes Historical Provider, MD  glucose blood (BAYER CONTOUR NEXT TEST) test strip Use once daily 07/30/15  Yes [name], MD  metFORMIN (GLUCOPHAGE-XR) 500 MG 24 hr tablet Take 3 tablets (1,500 mg total) by mouth daily. Patient taking differently: Take 500 mg by mouth 3 (three) times daily.  07/26/15  Yes [name], MD  Multiple Vitamins-Minerals (CENTRUM SILVER PO) Take 1 tablet by mouth.     Yes Historical Provider, MD  nystatin (MYCOSTATIN) 100000 UNIT/ML suspension Take 5 mLs (500,000 Units total) by mouth 4 (four) times daily. 08/13/15  Yes [name], MD  Probiotic Product ([place]) Take 1 capsule by mouth daily.   Yes Historical Provider, MD  simvastatin (ZOCOR) 20 MG tablet Take 1 tablet (20 mg total) by mouth at bedtime. 07/26/15  Yes [name], MD  valACYclovir (VALTREX) 500 MG tablet Take 1 tablet (500 mg total) by mouth daily. 07/26/15  Yes [name],  MD  budesonide-formoterol (SYMBICORT) 160-4.5 MCG/ACT inhaler Inhale 2 puffs into the lungs 2 (two) times daily. Patient not taking: Reported on 08/13/2015 01/17/15   [name], MD  PROAIR HFA 108 (90 BASE) MCG/ACT inhaler INHALE 2 PUFFS INTO THE LUNGS EVERY 6 (SIX) HOURS AS NEEDED FOR WHEEZING. Patient not taking: Reported on 08/13/2015 12/18/14   [name], MD   BP 157/67 mmHg  Pulse 118  Temp(Src) 98.2 F (36.8 C) (Oral)  Resp 22  SpO2 99% Physical Exam  Constitutional: She is oriented to person, place, and time. She appears well-developed and well-nourished. No distress.  HENT:  Head: Normocephalic and atraumatic.  Right Ear: Hearing normal.  Left Ear: Hearing normal.  Nose: Nose normal.  Mouth/Throat: Oropharynx is clear and moist and mucous membranes are normal.  Eyes: Conjunctivae and EOM are normal. Pupils are equal, round, and reactive to light.  Neck: Normal range of motion. Neck supple.  Cardiovascular: Regular rhythm, S1 normal and S2 normal.  Exam reveals no gallop and no friction rub.   No murmur heard. Pulmonary/Chest: Effort normal. She has wheezes. She exhibits no tenderness.  Diminished bilaterally with slight wheezes  Abdominal: Soft. Normal appearance and bowel sounds are normal. There is no hepatosplenomegaly. There is no tenderness. There is no rebound, no guarding, no tenderness at McBurney's point and negative Murphy's sign. No hernia.  Musculoskeletal: Normal range of motion.  Neurological: She is alert and oriented to person, place, and time. She has normal strength. No cranial nerve deficit or sensory deficit. Coordination normal. GCS eye subscore is 4. GCS verbal subscore is 5. GCS motor subscore is 6.  Skin: Skin is warm, dry and intact. No rash noted. No cyanosis.  Psychiatric: She has a normal mood and affect. Her speech is normal and behavior is normal. Thought content normal.  Nursing note and vitals reviewed.   ED Course  Procedures  DIAGNOSTIC  STUDIES: Oxygen Saturation is 99% on RA, normal by my interpretation.    COORDINATION OF CARE: 3:39 AM - Discussed plans to order a chest XR. Pt advised of plan for treatment and pt agrees.  Labs Review Labs Reviewed  CBC WITH DIFFERENTIAL/PLATELET  BASIC METABOLIC PANEL  TROPONIN I  BRAIN NATRIURETIC PEPTIDE    Imaging Review No results found. I have personally reviewed and evaluated these images and lab results as part of my medical decision-making.   EKG Interpretation   Date/Time:  Thursday August 16 2015 03:49:43 EST Ventricular Rate:  126 PR Interval:  142 QRS Duration: 91 QT Interval:  309 QTC Calculation: 447 R Axis:   84 Text Interpretation:  Sinus tachycardia Borderline right axis deviation  Low voltage, precordial leads No significant change since last tracing  Confirmed by POLLINA  MD, Plaza ([id number]) on 08/16/2015 3:52:24 AM      MDM   Final diagnoses:  None   COPD exacerbation  Patient presents to the ER for evaluation of difficulty breathing. Patient reports that she has been sick for more than a week. She was seen by her doctor and diagnosed with sinus infection and started on high-dose amoxicillin. She has had persistent cough. Tonight, however, she coughed up a large amount of mucus and then started having severe difficulty breathing. She did have evidence of tachypnea upon arrival. She was administered continuous nebulization and Solu-Medrol. She is anxious and tachycardic after the nebulizer, but still has some mild bronchospasm. She was ambulated in the hall and her oxygen saturation dropped to 84%. She will require supplemental oxygen and continuous treatment for COPD exacerbation. She will be admitted to the hospital.  I personally performed the services described in this documentation, which was scribed in my presence. The recorded information has been reviewed and is accurate.    Orpah Greek, MD 08/16/15 [phone]

## 2015-08-16 NOTE — Progress Notes (Signed)
Inappropriate Case Management Consult for COPD GOLD.  No previous hospital admissions this year at [facility] for COPD. Marney Doctor RN,BSN,NCM [phone]

## 2015-08-16 NOTE — Progress Notes (Signed)
CSW received consult for COPD Gold Protocol. However, pt does not meet criteria for COPD Gold Protocol-only 1 admission in the past 6 months.   Inappropriate CSW referral.  CSW signing off.   Alison Murray, MSW, [place] [employer] [phone]

## 2015-08-17 DIAGNOSIS — E1169 Type 2 diabetes mellitus with other specified complication: Secondary | ICD-10-CM

## 2015-08-17 DIAGNOSIS — I10 Essential (primary) hypertension: Secondary | ICD-10-CM

## 2015-08-17 DIAGNOSIS — E119 Type 2 diabetes mellitus without complications: Secondary | ICD-10-CM

## 2015-08-17 DIAGNOSIS — J441 Chronic obstructive pulmonary disease with (acute) exacerbation: Principal | ICD-10-CM

## 2015-08-17 DIAGNOSIS — D696 Thrombocytopenia, unspecified: Secondary | ICD-10-CM | POA: Diagnosis present

## 2015-08-17 DIAGNOSIS — R778 Other specified abnormalities of plasma proteins: Secondary | ICD-10-CM | POA: Diagnosis present

## 2015-08-17 DIAGNOSIS — R7989 Other specified abnormal findings of blood chemistry: Secondary | ICD-10-CM

## 2015-08-17 LAB — STREP PNEUMONIAE URINARY ANTIGEN: Strep Pneumo Urinary Antigen: NEGATIVE

## 2015-08-17 LAB — CBC
HCT: 40.6 % (ref 36.0–46.0)
Hemoglobin: 13.5 g/dL (ref 12.0–15.0)
MCH: 30.8 pg (ref 26.0–34.0)
MCHC: 33.3 g/dL (ref 30.0–36.0)
MCV: 92.5 fL (ref 78.0–100.0)
Platelets: 122 10*3/uL — ABNORMAL LOW (ref 150–400)
RBC: 4.39 MIL/uL (ref 3.87–5.11)
RDW: 13.9 % (ref 11.5–15.5)
WBC: 6.7 10*3/uL (ref 4.0–10.5)

## 2015-08-17 LAB — COMPREHENSIVE METABOLIC PANEL
ALT: 33 U/L (ref 14–54)
AST: 39 U/L (ref 15–41)
Albumin: 4.2 g/dL (ref 3.5–5.0)
Alkaline Phosphatase: 49 U/L (ref 38–126)
Anion gap: 11 (ref 5–15)
BUN: 22 mg/dL — ABNORMAL HIGH (ref 6–20)
CO2: 24 mmol/L (ref 22–32)
Calcium: 9.4 mg/dL (ref 8.9–10.3)
Chloride: 101 mmol/L (ref 101–111)
Creatinine, Ser: 0.62 mg/dL (ref 0.44–1.00)
GFR calc Af Amer: >60 mL/min (ref >60)
GFR calc non Af Amer: >60 mL/min (ref >60)
Glucose, Bld: 299 mg/dL — ABNORMAL HIGH (ref 65–99)
Potassium: 4.1 mmol/L (ref 3.5–5.1)
Sodium: 136 mmol/L (ref 135–145)
Total Bilirubin: 0.7 mg/dL (ref 0.3–1.2)
Total Protein: 7.2 g/dL (ref 6.5–8.1)

## 2015-08-17 LAB — GLUCOSE, CAPILLARY
Glucose-Capillary: 211 mg/dL — ABNORMAL HIGH (ref 65–99)
Glucose-Capillary: 258 mg/dL — ABNORMAL HIGH (ref 65–99)
Glucose-Capillary: 167 mg/dL — ABNORMAL HIGH (ref 65–99)
Glucose-Capillary: 237 mg/dL — ABNORMAL HIGH (ref 65–99)

## 2015-08-17 LAB — EXPECTORATED SPUTUM ASSESSMENT W GRAM STAIN, RFLX TO RESP C

## 2015-08-17 LAB — EXPECTORATED SPUTUM ASSESSMENT W REFEX TO RESP CULTURE

## 2015-08-17 MED ORDER — GUAIFENESIN-DM 100-10 MG/5ML PO SYRP
5.0000 mL | ORAL_SOLUTION | ORAL | Status: DC | PRN
  Administered 2015-08-17 – 2015-08-18 (×2): 5 mL via ORAL
  Filled 2015-08-17 (×2): qty 10

## 2015-08-17 MED ORDER — GUAIFENESIN ER 600 MG PO TB12
600.0000 mg | ORAL_TABLET | Freq: Two times a day (BID) | ORAL | Status: DC
Start: 2015-08-17 — End: 2015-08-18
  Administered 2015-08-17 – 2015-08-18 (×3): 600 mg via ORAL
  Filled 2015-08-17 (×3): qty 1

## 2015-08-17 MED ORDER — METHYLPREDNISOLONE SODIUM SUCC 125 MG IJ SOLR
80.0000 mg | Freq: Two times a day (BID) | INTRAMUSCULAR | Status: DC
  Administered 2015-08-17 – 2015-08-18 (×2): 80 mg via INTRAVENOUS
  Filled 2015-08-17 (×2): qty 2

## 2015-08-17 MED ORDER — LEVALBUTEROL HCL 0.63 MG/3ML IN NEBU
0.6300 mg | INHALATION_SOLUTION | Freq: Two times a day (BID) | RESPIRATORY_TRACT | Status: DC
  Filled 2015-08-17: qty 3

## 2015-08-17 MED ORDER — LEVALBUTEROL HCL 0.63 MG/3ML IN NEBU
0.6300 mg | INHALATION_SOLUTION | Freq: Four times a day (QID) | RESPIRATORY_TRACT | Status: DC
  Administered 2015-08-17: 0.63 mg via RESPIRATORY_TRACT
  Filled 2015-08-17: qty 3

## 2015-08-17 NOTE — Evaluation (Addendum)
Occupational Therapy One Time Evaluation Patient Details Name: Shannon Schultz MRN: [id number] DOB: November 28, 1945 Today's Date: 08/17/2015    History of Present Illness 70 year old female with a history of emphysema, diabetes mellitus type 2, essential hypertension, aortic stenosis, dyslipidemia, who presents to the ER with a chief complaint of respiratory distress   Clinical Impression   Pt is overall at supervision level with ADL, mostly for cues for purse lip breathing when becoming short of breath and reminders to sit and take a rest break. Educated pt on energy conservation techniques and issued handout. Feel pt is ok to perform ADL while in hospital with PRN assist by nursing. Husband is available to assist PRN for a few days at d/c and then will be returning to work. Discussed safety strategies for completing ADL. Pt's O2 level on RA at 92% at start of session and down to 83% with activity on RA. Increased to 95% on RA with rest and purse lip breathing. Nursing made aware.    Follow Up Recommendations  No OT follow up;Supervision - Intermittent    Equipment Recommendations  None recommended by OT    Recommendations for Other Services       Precautions / Restrictions Precautions Precaution Comments: monitor O2      Mobility Bed Mobility               General bed mobility comments: pt in chair.  Transfers Overall transfer level: Independent Equipment used: None                  Balance                                            ADL Overall ADL's : Needs assistance/impaired Eating/Feeding: Independent;Sitting   Grooming: Wash/dry hands;Supervision/safety;Standing   Upper Body Bathing: Set up;Sitting   Lower Body Bathing: Supervison/ safety;Sit to/from stand   Upper Body Dressing : Set up;Sitting   Lower Body Dressing: Supervision/safety;Sit to/from stand   Toilet Transfer: Supervision/safety;Comfort height toilet   Toileting-  Clothing Manipulation and Hygiene: Supervision/safety;Sit to/from stand   Tub/ Shower Transfer: Tub transfer;Supervision/safety     General ADL Comments: Pt reports that at home she is alittle SOB at baseline with ADL but is more SOB with activities currently. Discussed energy conservation techniques including purse lip breathing, rest breaks and pacing self, having stool to sit on for meal prep in kitchen, supervision for showering for safety, use of lukewarm water rather than hot water and breaking tasks into small steps. Issued enegy conservation handout also. Advised to have spouse check on her intermittenly while he is at work for safety also. Pt did state she felt SOB with in room activity today and O2 sats on RA were 83%. pt sat down and rested and sats up to 95% with rest and PLB.      Vision     Perception     Praxis      Pertinent Vitals/Pain Pain Assessment: No/denies pain     Hand Dominance     Extremity/Trunk Assessment Upper Extremity Assessment Upper Extremity Assessment: Overall WFL for tasks assessed           Communication Communication Communication: No difficulties   Cognition Arousal/Alertness: Awake/alert Behavior During Therapy: WFL for tasks assessed/performed Overall Cognitive Status: Within Functional Limits for tasks assessed  General Comments       Exercises       Shoulder Instructions      Home Living Family/patient expects to be discharged to:: Private residence Living Arrangements: Spouse/significant other Available Help at Discharge: Available PRN/intermittently Type of Home: House Home Access: Level entry     Home Layout: One level     Bathroom Shower/Tub: Teacher, early years/pre: Handicapped height     Home Equipment: None          Prior Functioning/Environment Level of Independence: Independent             OT Diagnosis: Generalized weakness   OT Problem List:     OT  Treatment/Interventions:      OT Goals(Current goals can be found in the care plan section) Acute Rehab OT Goals Patient Stated Goal: home when able. OT Goal Formulation: With patient  OT Frequency:     Barriers to D/C:            Co-evaluation              End of Session    Activity Tolerance: Other (comment) (some SOB at end of session.) Patient left: in chair;with call bell/phone within reach   Time: [id number] OT Time Calculation (min): 18 min Charges:  OT General Charges $OT Visit: 1 Procedure OT Evaluation $OT Eval Low Complexity: 1 Procedure G-Codes:    Jules Schick  [id number] 08/17/2015, 11:30 AM

## 2015-08-17 NOTE — Evaluation (Signed)
Physical Therapy Evaluation Patient Details Name: Shannon Schultz MRN: [id number] DOB: [birth year] Today's Date: 08/17/2015   History of Present Illness  70 year old female with a history of emphysema, diabetes mellitus type 2, essential hypertension, aortic stenosis, dyslipidemia, who presents to the ER with a chief complaint of respiratory distress and admitted with COPD exacerbation with bronchitis   Clinical Impression  Patient evaluated by Physical Therapy with no further acute PT needs identified. All education has been completed and the patient has no further questions.  See below for any follow-up Physical Therapy or equipment needs. PT is signing off. Thank you for this referral.  SATURATION QUALIFICATIONS: (This note is used to comply with regulatory documentation for home oxygen)  Patient Saturations on Room Air at Rest = 93%  Patient Saturations on Room Air while Ambulating = 84%  Patient Saturations on 1 Liters of oxygen while Ambulating = 93%  Please briefly explain why patient needs home oxygen: to maintain oxygen saturations above 88% during physical activity    Follow Up Recommendations No PT follow up    Equipment Recommendations  None recommended by PT    Recommendations for Other Services       Precautions / Restrictions Precautions Precaution Comments: monitor O2      Mobility  Bed Mobility               General bed mobility comments: pt up in recliner  Transfers Overall transfer level: Independent Equipment used: None Transfers: Sit to/from Stand              Ambulation/Gait Ambulation/Gait assistance: Supervision;Modified independent (Device/Increase time) Ambulation Distance (Feet): 200 Feet Assistive device: None Gait Pattern/deviations: Step-through pattern;Decreased stride length     General Gait Details: slow but steady pace, required supplemental O2 - pt educated and reports understanding  Network engineer Rank[place] (Stroke Patients Only)       Balance                                             Pertinent Vitals/Pain Pain Assessment: No/denies pain    Home Living Family/patient expects to be discharged to:: Private residence Living Arrangements: Spouse/significant other Available Help at Discharge: Available PRN/intermittently Type of Home: House Home Access: Level entry     Home Layout: One level Home Equipment: None      Prior Function Level of Independence: Independent               Hand Dominance        Extremity/Trunk Assessment   Upper Extremity Assessment: Overall WFL for tasks assessed           Lower Extremity Assessment: Overall WFL for tasks assessed         Communication   Communication: No difficulties  Cognition Arousal/Alertness: Awake/alert Behavior During Therapy: WFL for tasks assessed/performed Overall Cognitive Status: Within Functional Limits for tasks assessed                      General Comments      Exercises        Assessment/Plan    PT Assessment Patent does not need any further PT services  PT Diagnosis Difficulty walking   PT Problem List    PT Treatment Interventions  PT Goals (Current goals can be found in the Care Plan section) Acute Rehab PT Goals Patient Stated Goal: home when able. PT Goal Formulation: All assessment and education complete, DC therapy    Frequency     Barriers to discharge        Co-evaluation               End of Session Equipment Utilized During Treatment: Oxygen Activity Tolerance: Patient tolerated treatment well Patient left: in chair;with call bell/phone within reach Nurse Communication: Mobility status         Time: [id number] PT Time Calculation (min) (ACUTE ONLY): 12 min   Charges:   PT Evaluation $PT Eval Low Complexity: 1 Procedure     PT G Codes:        Shannon Schultz,Shannon Schultz 08/17/2015, 12:14 PM Carmelia Bake, PT, DPT 08/17/2015 Pager: [phone]

## 2015-08-17 NOTE — Progress Notes (Signed)
Inpatient Diabetes Program Recommendations  AACE/ADA: New Consensus Statement on Inpatient Glycemic Control (2015)  Target Ranges:  Prepandial:   less than 140 mg/dL      Peak postprandial:   less than 180 mg/dL (1-2 hours)      Critically ill patients:  140 - 180 mg/dL   Review of Glycemic Control  Results for ANTONISHA, MARINA (MRN [id number]) as of 08/17/2015 13:18  Ref. Range 08/17/2015 07:32 08/17/2015 11:46  Glucose-Capillary Latest Ref Range: 65-99 mg/dL 211 (H) 258 (H)   May benefit from meal coverage insulin while on high dose steroids.  Inpatient Diabetes Program Recommendations:    Consider addition of Novolog 3 units tidwc for meal coverage insulin while on steroids. Increase Lantus to 30 units QD.  Will continue to follow. Thank you. Lorenda Peck, RD, LDN, CDE Inpatient Diabetes Coordinator [phone]

## 2015-08-17 NOTE — Progress Notes (Addendum)
Patient ID: Shannon Schultz, female   DOB: 05-19-1946, 70 y.o.   MRN: [id number]  [facility] PROGRESS NOTE  LARIYAH GLASPY [id number] DOB: 1945/09/28 DOA: 08/16/2015 PCP: [name], MD   Brief narrative:    70 year old female with a history of COPD, diabetes mellitus type 2, essential hypertension, aortic stenosis, dyslipidemia, who presented to [facility] ED with main concern of progressive dyspnea at rest and with exertion associated with productive cough of clear sputum, chills, chest tightness that occurs with coughing spells. She was recently treated with Amoxicillin for acute otitis media and sinusitis.   Assessment/Plan:    Principal Problem:   COPD exacerbation with bronchitis ([place]) - with hypoxia on admission - with oxygen currently 95% on 2 L O2 via [place] - continue BD's scheduled and as needed - continue solumedrol and plan on tapering down in the next 24 hours  - continue empiric Zithromax and Rocephin day #2 - sputum culture requested   Active Problems:   Type 2 diabetes mellitus, with long term use of insulin ([facility]) - added SSI to the regimen while on solumedrol  - continue Insulin Glargine     Essential hypertension, benign - reasonable inpatient control     Thrombocytopenia - mild, no sings of bleeding - CBC in AM    Elevated troponins - flat and mild  - no chest pain this AM - likely from acute COPD     Obesity (BMI 30-39.9) - Body mass index is 41.98 kg/(m^2).  DVT prophylaxis - Lovenox SQ  Code Status: Full.  Family Communication:  plan of care discussed with the patient Disposition Plan: Home by 2/19  IV access:  Peripheral IV  Procedures and diagnostic studies:    Dg Chest 2 View 08/16/2015  COPD, no superimposed acute cardiopulmonary process.   Medical Consultants:  None  Other Consultants:  None  IAnti-Infectives:   Zithromax 2/16 --> Rocephin 2/16 -->  Faye Ramsay, MD  [place] Pager [phone]  If 7PM-7AM, please contact  night-coverage [email or web address] Password [facility], [place] 08/17/2015, 11:42 AM   LOS: 1 day   HPI/Subjective: No events overnight.   Objective: Filed Vitals:   08/16/15 2051 08/17/15 0508 08/17/15 0920 08/17/15 1000  BP: 130/55 142/60  129/64  Pulse: 82 74 88 82  Temp: 97.9 F (36.6 C) 97.5 F (36.4 C)  97.9 F (36.6 C)  TempSrc: Oral Oral  Oral  Resp: 18 18  18   Height:      Weight:      SpO2: 95% 95% 83% 95%    Intake/Output Summary (Last 24 hours) at 08/17/15 1142 Last data filed at 08/17/15 0600  Gross per 24 hour  Intake   1785 ml  Output      0 ml  Net   1785 ml    Exam:   General:  Pt is alert, follows commands appropriately, not in acute distress  Cardiovascular: Regular rate and rhythm, no rubs, no gallops  Respiratory: Diminished breath sounds at bases with exp wheezing   Abdomen: Soft, non tender, non distended, bowel sounds present, no guarding  Extremities: No edema, pulses DP and PT palpable bilaterally  Neuro: Grossly nonfocal  Data Reviewed: Basic Metabolic Panel:  Recent Labs Lab 08/16/15 0417 08/16/15 0933 08/17/15 0449  NA 141  --  136  K 4.4  --  4.1  CL 104  --  101  CO2 25  --  24  GLUCOSE 200*  --  299*  BUN 25*  --  22*  CREATININE 0.95  --  0.62  CALCIUM 9.6  --  9.4  MG  --  1.7  --    Liver Function Tests:  Recent Labs Lab 08/17/15 0449  AST 39  ALT 33  ALKPHOS 49  BILITOT 0.7  PROT 7.2  ALBUMIN 4.2   CBC:  Recent Labs Lab 08/16/15 0417 08/17/15 0449  WBC 7.2 6.7  NEUTROABS 5.4  --   HGB 14.0 13.5  HCT 43.0 40.6  MCV 94.9 92.5  PLT 142* 122*   Cardiac Enzymes:  Recent Labs Lab 08/16/15 0417 08/16/15 0933 08/16/15 1556 08/16/15 2059  TROPONINI 0.06* 0.06* 0.05* 0.04*   CBG:  Recent Labs Lab 08/16/15 1217 08/16/15 1646 08/16/15 2144 08/17/15 0732  GLUCAP 252* 258* 180* 211*    Scheduled Meds: . aspirin EC  81 mg Oral Daily  . azithromycin  500 mg Intravenous Q24H  .  bisoprolol-hydrochlorothiazide  1 tablet Oral Daily  . cefTRIAXone (ROCEPHIN)  IV  1 g Intravenous Q24H  . enoxaparin (LOVENOX) injection  40 mg Subcutaneous Q24H  . fluticasone  2 spray Each Nare Daily  . insulin aspart  0-15 Units Subcutaneous TID WC  . insulin aspart  0-5 Units Subcutaneous QHS  . insulin glargine  25 Units Subcutaneous Daily  . Investigational - Study Medication  2 puff Inhalation BID  . loratadine  10 mg Oral Daily  . methylPREDNISolone sodium succinate  80 mg Intravenous 3 times per day  . nystatin  5 mL Oral QID  . simvastatin  20 mg Oral QHS  . sodium chloride flush  3 mL Intravenous Q12H  . valACYclovir  500 mg Oral Daily   Continuous Infusions: . sodium chloride 75 mL/hr at 08/17/15 0715

## 2015-08-18 DIAGNOSIS — R7989 Other specified abnormal findings of blood chemistry: Secondary | ICD-10-CM

## 2015-08-18 LAB — BASIC METABOLIC PANEL
Anion gap: 8 (ref 5–15)
BUN: 29 mg/dL — ABNORMAL HIGH (ref 6–20)
CO2: 26 mmol/L (ref 22–32)
Calcium: 9.1 mg/dL (ref 8.9–10.3)
Chloride: 104 mmol/L (ref 101–111)
Creatinine, Ser: 0.81 mg/dL (ref 0.44–1.00)
GFR calc Af Amer: >60 mL/min (ref >60)
GFR calc non Af Amer: >60 mL/min (ref >60)
Glucose, Bld: 245 mg/dL — ABNORMAL HIGH (ref 65–99)
Potassium: 4.2 mmol/L (ref 3.5–5.1)
Sodium: 138 mmol/L (ref 135–145)

## 2015-08-18 LAB — CBC
HCT: 41.1 % (ref 36.0–46.0)
Hemoglobin: 13.7 g/dL (ref 12.0–15.0)
MCH: 31.4 pg (ref 26.0–34.0)
MCHC: 33.3 g/dL (ref 30.0–36.0)
MCV: 94.3 fL (ref 78.0–100.0)
Platelets: 112 10*3/uL — ABNORMAL LOW (ref 150–400)
RBC: 4.36 MIL/uL (ref 3.87–5.11)
RDW: 14.1 % (ref 11.5–15.5)
WBC: 6.2 10*3/uL (ref 4.0–10.5)

## 2015-08-18 LAB — GLUCOSE, CAPILLARY
Glucose-Capillary: 222 mg/dL — ABNORMAL HIGH (ref 65–99)
Glucose-Capillary: 304 mg/dL — ABNORMAL HIGH (ref 65–99)

## 2015-08-18 MED ORDER — ALBUTEROL SULFATE HFA 108 (90 BASE) MCG/ACT IN AERS: INHALATION_SPRAY | RESPIRATORY_TRACT | Status: DC

## 2015-08-18 MED ORDER — FLUTICASONE PROPIONATE 50 MCG/ACT NA SUSP: 2.0000 | Freq: Every day | NASAL | Status: DC

## 2015-08-18 MED ORDER — AZITHROMYCIN 250 MG PO TABS: 250.0000 mg | ORAL_TABLET | Freq: Every day | ORAL | Status: DC

## 2015-08-18 MED ORDER — PREDNISONE 10 MG PO TABS: ORAL_TABLET | ORAL | Status: DC

## 2015-08-18 MED ORDER — AZITHROMYCIN 250 MG PO TABS: 500.0000 mg | ORAL_TABLET | Freq: Every day | ORAL | Status: DC

## 2015-08-18 MED ORDER — GUAIFENESIN ER 600 MG PO TB12: 600.0000 mg | ORAL_TABLET | Freq: Two times a day (BID) | ORAL | Status: DC

## 2015-08-18 MED ORDER — GUAIFENESIN-DM 100-10 MG/5ML PO SYRP: 5.0000 mL | ORAL_SOLUTION | ORAL | Status: DC | PRN

## 2015-08-18 NOTE — Discharge Instructions (Signed)
Upper Respiratory Infection, Adult Most upper respiratory infections (URIs) are a viral infection of the air passages leading to the lungs. A URI affects the nose, throat, and upper air passages. The most common type of URI is nasopharyngitis and is typically referred to as "the common cold." URIs run their course and usually go away on their own. Most of the time, a URI does not require medical attention, but sometimes a bacterial infection in the upper airways can follow a viral infection. This is called a secondary infection. Sinus and middle ear infections are common types of secondary upper respiratory infections. Bacterial pneumonia can also complicate a URI. A URI can worsen asthma and chronic obstructive pulmonary disease (COPD). Sometimes, these complications can require emergency medical care and may be life threatening.  CAUSES Almost all URIs are caused by viruses. A virus is a type of germ and can spread from one person to another.  RISKS FACTORS You may be at risk for a URI if:   You smoke.   You have chronic heart or lung disease.  You have a weakened defense (immune) system.   You are very young or very old.   You have nasal allergies or asthma.  You work in crowded or poorly ventilated areas.  You work in health care facilities or schools. SIGNS AND SYMPTOMS  Symptoms typically develop 2-3 days after you come in contact with a cold virus. Most viral URIs last 7-10 days. However, viral URIs from the influenza virus (flu virus) can last 14-18 days and are typically more severe. Symptoms may include:   Runny or stuffy (congested) nose.   Sneezing.   Cough.   Sore throat.   Headache.   Fatigue.   Fever.   Loss of appetite.   Pain in your forehead, behind your eyes, and over your cheekbones (sinus pain).  Muscle aches.  DIAGNOSIS  Your health care provider may diagnose a URI by:  Physical exam.  Tests to check that your symptoms are not due to  another condition such as:  Strep throat.  Sinusitis.  Pneumonia.  Asthma. TREATMENT  A URI goes away on its own with time. It cannot be cured with medicines, but medicines may be prescribed or recommended to relieve symptoms. Medicines may help:  Reduce your fever.  Reduce your cough.  Relieve nasal congestion. HOME CARE INSTRUCTIONS   Take medicines only as directed by your health care provider.   Gargle warm saltwater or take cough drops to comfort your throat as directed by your health care provider.  Use a warm mist humidifier or inhale steam from a shower to increase air moisture. This may make it easier to breathe.  Drink enough fluid to keep your urine clear or pale yellow.   Eat soups and other clear broths and maintain good nutrition.   Rest as needed.   Return to work when your temperature has returned to normal or as your health care provider advises. You may need to stay home longer to avoid infecting others. You can also use a face mask and careful hand washing to prevent spread of the virus.  Increase the usage of your inhaler if you have asthma.   Do not use any tobacco products, including cigarettes, chewing tobacco, or electronic cigarettes. If you need help quitting, ask your health care provider. PREVENTION  The best way to protect yourself from getting a cold is to practice good hygiene.   Avoid oral or hand contact with people with cold   symptoms.   Wash your hands often if contact occurs.  There is no clear evidence that vitamin C, vitamin E, echinacea, or exercise reduces the chance of developing a cold. However, it is always recommended to get plenty of rest, exercise, and practice good nutrition.  SEEK MEDICAL CARE IF:   You are getting worse rather than better.   Your symptoms are not controlled by medicine.   You have chills.  You have worsening shortness of breath.  You have brown or red mucus.  You have yellow or brown nasal  discharge.  You have pain in your face, especially when you bend forward.  You have a fever.  You have swollen neck glands.  You have pain while swallowing.  You have white areas in the back of your throat. SEEK IMMEDIATE MEDICAL CARE IF:   You have severe or persistent:  Headache.  Ear pain.  Sinus pain.  Chest pain.  You have chronic lung disease and any of the following:  Wheezing.  Prolonged cough.  Coughing up blood.  A change in your usual mucus.  You have a stiff neck.  You have changes in your:  Vision.  Hearing.  Thinking.  Mood. MAKE SURE YOU:   Understand these instructions.  Will watch your condition.  Will get help right away if you are not doing well or get worse.   This information is not intended to replace advice given to you by your health care provider. Make sure you discuss any questions you have with your health care provider.   Document Released: 12/10/2000 Document Revised: 10/31/2014 Document Reviewed: 09/21/2013 Elsevier Interactive Patient Education 2016 [employer]  

## 2015-08-18 NOTE — Progress Notes (Signed)
PHARMACIST - PHYSICIAN COMMUNICATION DR:   Doyle Askew CONCERNING: Antibiotic IV to Oral Route Change Policy  RECOMMENDATION: This patient is receiving Zithromax by the intravenous route.  Based on criteria approved by the Pharmacy and Therapeutics Committee, the antibiotic(s) is/are being converted to the equivalent oral dose form(s) starting tomorrow 08/19/15.   DESCRIPTION: These criteria include:  Patient being treated for a respiratory tract infection, urinary tract infection, cellulitis or clostridium difficile associated diarrhea if on metronidazole  The patient is not neutropenic and does not exhibit a GI malabsorption state  The patient is eating (either orally or via tube) and/or has been taking other orally administered medications for a least 24 hours  The patient is improving clinically and has a Tmax < 100.5  If you have questions about this conversion, please contact the Pharmacy Department  []   [phone] )  [facility] []   [phone] )  [facility] []   [phone] )  [facility] []   [phone] )  [facility] [x]   [phone] )  [place], PharmD, [place] Pager: [phone] 08/18/2015@12 :07 PM

## 2015-08-18 NOTE — Discharge Summary (Signed)
Physician Discharge Summary  Shannon Schultz [id number] DOB: Dec 14, 1945 DOA: 08/16/2015  PCP: [name], MD  Admit date: 08/16/2015 Discharge date: 08/18/2015  Recommendations for Outpatient Follow-up:  1. Pt will need to follow up with PCP in 2-3 weeks post discharge 2. Please obtain BMP to evaluate electrolytes and kidney function 3. Please also check CBC to evaluate Hg and Hct levels 4. Please note that pt had influenza pcr negative and resp viral panel positive for influenza flu, since outside the treatment window and considering pt better, no tamiflu started and pt made aware, she confirmed she feels better and will only continue taking prednisone and abx   Discharge Diagnoses:  Principal Problem:   COPD with acute exacerbation ([place]) Active Problems:   Essential hypertension, benign   Type II diabetes with long term use of insulin (HCC)   Thrombocytopenia (HCC)   Elevated troponin  Discharge Condition: Stable  Diet recommendation: Heart healthy diet discussed in details   Brief narrative:    70 year old female with a history of COPD, diabetes mellitus type 2, essential hypertension, aortic stenosis, dyslipidemia, who presented to [facility] ED with main concern of progressive dyspnea at rest and with exertion associated with productive cough of clear sputum, chills, chest tightness that occurs with coughing spells. She was recently treated with Amoxicillin for acute otitis media and sinusitis.   Assessment/Plan:    Principal Problem:  COPD exacerbation with bronchitis, influenza A ([place]) - with hypoxia on admission - with oxygen currently 95% on 2 L O2 via [place] - much better and off oxygen this AM, wants to go home  - continue prednisone taper upon discharge  - continue abx treatment to complete therapy  - influenza pcr negative but viral panel positive with influenza A, outside the treatment window so no treatment initiated as viral panel final report available 4 days post  symptoms onset, pt made aware and explains she is doing better and wants to go home   Active Problems:  Type 2 diabetes mellitus, with long term use of insulin ([place]) - continue home medical regimen     Essential hypertension, benign - reasonable inpatient control    Thrombocytopenia - mild, no sings of bleeding   Elevated troponins - flat and mild  - no chest pain this AM   Obesity (BMI 30-39.9) - Body mass index is 41.98 kg/(m^2).   Code Status: Full.  Family Communication: plan of care discussed with the patient Disposition Plan: Home   IV access:  Peripheral IV  Procedures and diagnostic studies:   Dg Chest 2 View 08/16/2015 COPD, no superimposed acute cardiopulmonary process.   Medical Consultants:  None  Other Consultants:  None       Discharge Exam: Filed Vitals:   08/17/15 2126 08/18/15 0701  BP: 121/58 136/62  Pulse: 75 68  Temp: 98 F (36.7 C) 97.8 F (36.6 C)  Resp: 20 20   Filed Vitals:   08/17/15 1404 08/17/15 1439 08/17/15 2126 08/18/15 0701  BP:  126/59 121/58 136/62  Pulse:  91 75 68  Temp:  97.9 F (36.6 C) 98 F (36.7 C) 97.8 F (36.6 C)  TempSrc:  Oral Oral Oral  Resp:  20 20 20   Height:      Weight:      SpO2: 97% 94% 97% 95%    General: Pt is alert, follows commands appropriately, not in acute distress Cardiovascular: Regular rate and rhythm, S1/S2 +, no murmurs, no rubs, no gallops Respiratory: Clear to auscultation bilaterally,  no wheezing, diminished breath sounds at bases  Abdominal: Soft, non tender, non distended, bowel sounds +, no guarding Extremities: no edema, no cyanosis, pulses palpable bilaterally DP and PT Neuro: Grossly nonfocal  Discharge Instructions  Discharge Instructions    Diet - low sodium heart healthy    Complete by:  As directed      Increase activity slowly    Complete by:  As directed             Medication List    STOP taking these medications         budesonide-formoterol 160-4.5 MCG/ACT inhaler  Commonly known as:  SYMBICORT      TAKE these medications        acetaminophen 325 MG tablet  Commonly known as:  TYLENOL  Take 650 mg by mouth every 6 (six) hours as needed for mild pain, fever or headache. Reported on 08/13/2015     albuterol (2.5 MG/3ML) 0.083% nebulizer solution  Commonly known as:  PROVENTIL  Take 3 mLs (2.5 mg total) by nebulization every 4 (four) hours as needed for wheezing or shortness of breath.     albuterol 108 (90 Base) MCG/ACT inhaler  Commonly known as:  PROAIR HFA  INHALE 2 PUFFS INTO THE LUNGS EVERY 6 (SIX) HOURS AS NEEDED FOR WHEEZING.     amoxicillin 500 MG tablet  Commonly known as:  AMOXIL  Take 2 tablets (1,000 mg total) by mouth 2 (two) times daily.     aspirin EC 81 MG tablet  Take 81 mg by mouth daily.     azithromycin 250 MG tablet  Commonly known as:  ZITHROMAX  Take 1 tablet (250 mg total) by mouth daily.     BAYER MICROLET LANCETS lancets  Use as instructed     bisoprolol-hydrochlorothiazide 10-6.25 MG tablet  Commonly known as:  ZIAC  Take 1 tablet by mouth daily.     CALCIUM 600+D 600-400 MG-UNIT tablet  Generic drug:  Calcium Carbonate-Vitamin D  Take 1 tablet by mouth daily.     CENTRUM SILVER PO  Take 1 tablet by mouth.     CORICIDIN HBP COLD/FLU PO  Take 1 tablet by mouth every 8 (eight) hours as needed (cold symptoms).     fluticasone 50 MCG/ACT nasal spray  Commonly known as:  FLONASE  Place 2 sprays into both nostrils daily.     glucose blood test strip  Commonly known as:  BAYER CONTOUR NEXT TEST  Use once daily     guaiFENesin 600 MG 12 hr tablet  Commonly known as:  MUCINEX  Take 1 tablet (600 mg total) by mouth 2 (two) times daily.     guaiFENesin-dextromethorphan 100-10 MG/5ML syrup  Commonly known as:  ROBITUSSIN DM  Take 5 mLs by mouth every 4 (four) hours as needed for cough.     Investigational - Study Medication  Inhale 2 puffs into the lungs 2  (two) times daily. Additional Study Details: PT [id number] Rescue inhaler, double blind study     KLS ALLER-TEC 10 MG tablet  Generic drug:  cetirizine  Take 10 mg by mouth daily.     metFORMIN 500 MG 24 hr tablet  Commonly known as:  GLUCOPHAGE-XR  Take 3 tablets (1,500 mg total) by mouth daily.     nystatin 100000 UNIT/ML suspension  Commonly known as:  MYCOSTATIN  Take 5 mLs (500,000 Units total) by mouth 4 (four) times daily.     [place] PO  Take  1 capsule by mouth daily.     predniSONE 10 MG tablet  Commonly known as:  DELTASONE  Take 40 mg tablet 2/19 and taper down by 10 mg daily until completed     simvastatin 20 MG tablet  Commonly known as:  ZOCOR  Take 1 tablet (20 mg total) by mouth at bedtime.     valACYclovir 500 MG tablet  Commonly known as:  VALTREX  Take 1 tablet (500 mg total) by mouth daily.     Vitamin D 2000 units tablet  Take 4,000 Units by mouth daily.            Follow-up Information    Follow up with KNAPP,EVE A, MD.   Specialty:  Family Medicine   Contact information:   [address] [place] [address] [phone]        The results of significant diagnostics from this hospitalization (including imaging, microbiology, ancillary and laboratory) are listed below for reference.     Microbiology: Recent Results (from the past 240 hour(s))  Culture, expectorated sputum-assessment     Status: None   Collection Time: 08/17/15  1:23 PM  Result Value Ref Range Status   Specimen Description SPUTUM  Final   Special Requests NONE  Final   Sputum evaluation   Final    THIS SPECIMEN IS ACCEPTABLE. RESPIRATORY CULTURE REPORT TO FOLLOW.   Report Status 08/17/2015 FINAL  Final     Labs: Basic Metabolic Panel:  Recent Labs Lab 08/16/15 0417 08/16/15 0933 08/17/15 0449 08/18/15 0535  NA 141  --  136 138  K 4.4  --  4.1 4.2  CL 104  --  101 104  CO2 25  --  24 26  GLUCOSE 200*  --  299* 245*  BUN 25*  --  22*  29*  CREATININE 0.95  --  0.62 0.81  CALCIUM 9.6  --  9.4 9.1  MG  --  1.7  --   --    Liver Function Tests:  Recent Labs Lab 08/17/15 0449  AST 39  ALT 33  ALKPHOS 49  BILITOT 0.7  PROT 7.2  ALBUMIN 4.2   CBC:  Recent Labs Lab 08/16/15 0417 08/17/15 0449 08/18/15 0535  WBC 7.2 6.7 6.2  NEUTROABS 5.4  --   --   HGB 14.0 13.5 13.7  HCT 43.0 40.6 41.1  MCV 94.9 92.5 94.3  PLT 142* 122* 112*   Cardiac Enzymes:  Recent Labs Lab 08/16/15 0417 08/16/15 0933 08/16/15 1556 08/16/15 2059  TROPONINI 0.06* 0.06* 0.05* 0.04*   BNP: BNP (last 3 results)  Recent Labs  08/16/15 0417  BNP 34.7   CBG:  Recent Labs Lab 08/17/15 0732 08/17/15 1146 08/17/15 1712 08/17/15 2130 08/18/15 0724  GLUCAP 211* 258* 167* 237* 222*   SIGNED: Time coordinating discharge:  30 minutes  Faye Ramsay, MD  [facility] 08/18/2015, 10:05 AM Pager [phone]  If 7PM-7AM, please contact night-coverage [email or web address] Password [email or web address]

## 2015-08-18 NOTE — Progress Notes (Signed)
Completed D/C teaching with patient. Gave prescriptions. Patient will be D/C home with family in stable condition.

## 2015-08-19 LAB — RESPIRATORY VIRUS PANEL
Adenovirus: NEGATIVE
Influenza A: POSITIVE — AB
Influenza B: NEGATIVE
Metapneumovirus: NEGATIVE
Parainfluenza 1: NEGATIVE
Parainfluenza 2: NEGATIVE
Parainfluenza 3: NEGATIVE
Respiratory Syncytial Virus A: NEGATIVE
Respiratory Syncytial Virus B: NEGATIVE
Rhinovirus: NEGATIVE

## 2015-08-19 LAB — CULTURE, RESPIRATORY
Culture: NORMAL
Gram Stain: NONE SEEN

## 2015-08-19 LAB — CULTURE, RESPIRATORY W GRAM STAIN

## 2015-08-20 ENCOUNTER — Telehealth: Payer: Self-pay | Admitting: Family Medicine

## 2015-08-20 LAB — LEGIONELLA ANTIGEN, URINE

## 2015-08-20 NOTE — Telephone Encounter (Signed)
Pt states that she is feeling a lot better. She states she went to the ER because she could not breathe. Pt states she did test positive for flu but discharge summary states URI. We went over all meds and hospital but her on prednisone, azithromycin and flonase. They also recommend she take a OTC cough medication. She only has two days left of prednisone and azithromycin. Pt also states that they kept her on the amoxicillin. No other medications changed and she has all her meds at home. Pt made an appt for 08/27/2015 and was told to bring all meds with her at that visit. After hours protocol was gone over and she verified understanding. Pt was reminded that if she has any issues like what took her to ER in first place to return to ER.

## 2015-08-21 ENCOUNTER — Telehealth: Payer: Self-pay | Admitting: Family Medicine

## 2015-08-21 NOTE — Telephone Encounter (Signed)
Received signed records request from [employer]. Records faxed to [phone]

## 2015-08-27 ENCOUNTER — Ambulatory Visit (INDEPENDENT_AMBULATORY_CARE_PROVIDER_SITE_OTHER): Payer: Commercial Managed Care - HMO | Admitting: Family Medicine

## 2015-08-27 ENCOUNTER — Encounter: Payer: Self-pay | Admitting: Family Medicine

## 2015-08-27 VITALS — BP 132/78 | HR 76 | Temp 98.2°F | Ht 64.0 in | Wt 249.4 lb

## 2015-08-27 DIAGNOSIS — J111 Influenza due to unidentified influenza virus with other respiratory manifestations: Secondary | ICD-10-CM

## 2015-08-27 DIAGNOSIS — Z5181 Encounter for therapeutic drug level monitoring: Secondary | ICD-10-CM | POA: Diagnosis not present

## 2015-08-27 DIAGNOSIS — J441 Chronic obstructive pulmonary disease with (acute) exacerbation: Secondary | ICD-10-CM | POA: Diagnosis not present

## 2015-08-27 DIAGNOSIS — B370 Candidal stomatitis: Secondary | ICD-10-CM | POA: Diagnosis not present

## 2015-08-27 DIAGNOSIS — J029 Acute pharyngitis, unspecified: Secondary | ICD-10-CM

## 2015-08-27 DIAGNOSIS — D696 Thrombocytopenia, unspecified: Secondary | ICD-10-CM | POA: Diagnosis not present

## 2015-08-27 DIAGNOSIS — I359 Nonrheumatic aortic valve disorder, unspecified: Secondary | ICD-10-CM | POA: Diagnosis not present

## 2015-08-27 DIAGNOSIS — R609 Edema, unspecified: Secondary | ICD-10-CM | POA: Diagnosis not present

## 2015-08-27 DIAGNOSIS — I350 Nonrheumatic aortic (valve) stenosis: Secondary | ICD-10-CM

## 2015-08-27 DIAGNOSIS — R600 Localized edema: Secondary | ICD-10-CM | POA: Diagnosis not present

## 2015-08-27 LAB — CBC WITH DIFFERENTIAL/PLATELET
Basophils Absolute: 0.1 10*3/uL (ref 0.0–0.1)
Basophils Relative: 1 % (ref 0–1)
Eosinophils Absolute: 0.1 10*3/uL (ref 0.0–0.7)
Eosinophils Relative: 1 % (ref 0–5)
HCT: 39.1 % (ref 36.0–46.0)
Hemoglobin: 12.8 g/dL (ref 12.0–15.0)
Lymphocytes Relative: 19 % (ref 12–46)
Lymphs Abs: 1.6 10*3/uL (ref 0.7–4.0)
MCH: 30.3 pg (ref 26.0–34.0)
MCHC: 32.7 g/dL (ref 30.0–36.0)
MCV: 92.4 fL (ref 78.0–100.0)
MPV: 10 fL (ref 8.6–12.4)
Monocytes Absolute: 0.3 10*3/uL (ref 0.1–1.0)
Monocytes Relative: 4 % (ref 3–12)
Neutro Abs: 6.3 10*3/uL (ref 1.7–7.7)
Neutrophils Relative %: 75 % (ref 43–77)
Platelets: 131 10*3/uL — ABNORMAL LOW (ref 150–400)
RBC: 4.23 MIL/uL (ref 3.87–5.11)
RDW: 14.2 % (ref 11.5–15.5)
WBC: 8.4 10*3/uL (ref 4.0–10.5)

## 2015-08-27 LAB — BASIC METABOLIC PANEL
BUN: 17 mg/dL (ref 7–25)
CO2: 27 mmol/L (ref 20–31)
Calcium: 9.7 mg/dL (ref 8.6–10.4)
Chloride: 102 mmol/L (ref 98–110)
Creat: 0.7 mg/dL (ref 0.50–0.99)
Glucose, Bld: 132 mg/dL — ABNORMAL HIGH (ref 65–99)
Potassium: 4.1 mmol/L (ref 3.5–5.3)
Sodium: 138 mmol/L (ref 135–146)

## 2015-08-27 MED ORDER — FUROSEMIDE 20 MG PO TABS: ORAL_TABLET | ORAL | Status: DC

## 2015-08-27 MED ORDER — NYSTATIN 100000 UNIT/ML MT SUSP: 5.0000 mL | Freq: Four times a day (QID) | OROMUCOSAL | Status: DC

## 2015-08-27 NOTE — Patient Instructions (Addendum)
Continue low sodium diet and keeping the legs elevated (vs compression stockings). Take the furosemide (water pill) once daily in the morning only until your swelling has improved (up to a week). Eat a banana daily while taking the diuretic.  Your infection has resolved--lungs, ear are clear.  You may have a bit of residual thrush in the throat. If your sore throat isn't improving, restart the nystatin for another course. Continue to rinse after using inhalers (not albuterol).

## 2015-08-27 NOTE — Progress Notes (Signed)
Chief Complaint  Patient presents with  . Hospitalization Follow-up    not really feeling much better, has lots of congestion-mucus is still clear, no fevers. Both legs are very swollen since discharge B/L and painful because they are so swollen. She is concerned about the diagnosis on her chart of "type 2 diabetes with long term use of insulin." Also would like some further explanantion of elevated tropinin level as well as thrombocytopenia.    Ankles have been swollen since being in the hospital.  Has been limiting her sodium and keeping her legs elevated.  Swelling has remained the same since hospital. She has been very active today, cleaning house, and still just as swollen as they were when she woke up.  She has AS; last echo was 03/2015:  Mod AS, EF 63-33%, grade 1 diastolic dysfunction  She is also complaining of a sore throat since this morning. Tylenol this morning didn't help.  Denies fevers. Right side hurts a little more than the left.  She has a chronic runny nose.  Roof of her mouth is also a little sore.  She continues to take Allertec daily.  Hospitalized 2/16-18.  influenza pcr negative and resp viral panel positive for influenza flu. She completed a z-pak, and was discharged on prednisone. She finished her prednisone taper 2/22, and recently completed the amoxicillin prescription as well. Hasn't needed any albuterol, breathing has been fine since discharge.  Per discharge summary, recheck b-met and CBC at follow-up.  PMH, PSH, SH reviewed.  Outpatient Encounter Prescriptions as of 08/27/2015  Medication Sig Note  . acetaminophen (TYLENOL) 325 MG tablet Take 650 mg by mouth every 6 (six) hours as needed for mild pain, fever or headache. Reported on 08/13/2015 05/15/2014: Uses prn for hand arthritis pain  . aspirin EC 81 MG tablet Take 81 mg by mouth daily.     Marland Kitchen BAYER MICROLET LANCETS lancets Use as instructed   . bisoprolol-hydrochlorothiazide (ZIAC) 10-6.25 MG tablet Take 1  tablet by mouth daily.   . Calcium Carbonate-Vitamin D (CALCIUM 600+D) 600-400 MG-UNIT per tablet Take 1 tablet by mouth daily.    . cetirizine (KLS ALLER-TEC) 10 MG tablet Take 10 mg by mouth daily.   . Cholecalciferol (VITAMIN D) 2000 UNITS tablet Take 4,000 Units by mouth daily.   . fluticasone (FLONASE) 50 MCG/ACT nasal spray Place 2 sprays into both nostrils daily.   Marland Kitchen glucose blood (BAYER CONTOUR NEXT TEST) test strip Use once daily   . guaiFENesin (MUCINEX) 600 MG 12 hr tablet Take 1 tablet (600 mg total) by mouth 2 (two) times daily.   Marland Kitchen guaiFENesin-dextromethorphan (ROBITUSSIN DM) 100-10 MG/5ML syrup Take 5 mLs by mouth every 4 (four) hours as needed for cough.   . Investigational - Study Medication Inhale 2 puffs into the lungs 2 (two) times daily. Additional Study Details: PT [id number] Rescue inhaler, double blind study   . metFORMIN (GLUCOPHAGE-XR) 500 MG 24 hr tablet Take 3 tablets (1,500 mg total) by mouth daily. (Patient taking differently: Take 500 mg by mouth 3 (three) times daily. )   . Multiple Vitamins-Minerals (CENTRUM SILVER PO) Take 1 tablet by mouth.     . Probiotic Product ([place]IPS COLON HEALTH PO) Take 1 capsule by mouth daily.   . simvastatin (ZOCOR) 20 MG tablet Take 1 tablet (20 mg total) by mouth at bedtime.   . valACYclovir (VALTREX) 500 MG tablet Take 1 tablet (500 mg total) by mouth daily. 08/16/2015: Pt taking daily-- long term use   .  albuterol (PROAIR HFA) 108 (90 Base) MCG/ACT inhaler INHALE 2 PUFFS INTO THE LUNGS EVERY 6 (SIX) HOURS AS NEEDED FOR WHEEZING. (Patient not taking: Reported on 08/27/2015)   . albuterol (PROVENTIL) (2.5 MG/3ML) 0.083% nebulizer solution Take 3 mLs (2.5 mg total) by nebulization every 4 (four) hours as needed for wheezing or shortness of breath. (Patient not taking: Reported on 08/27/2015)   . furosemide (LASIX) 20 MG tablet Take 1 tablet by mouth only if needed for swelling   . nystatin (MYCOSTATIN) 100000 UNIT/ML suspension Take 5 mLs  (500,000 Units total) by mouth 4 (four) times daily.   . [DISCONTINUED] amoxicillin (AMOXIL) 500 MG tablet Take 2 tablets (1,000 mg total) by mouth 2 (two) times daily. 08/16/2015: Pt began med course 02/13-- 10 days supply   . [DISCONTINUED] azithromycin (ZITHROMAX) 250 MG tablet Take 1 tablet (250 mg total) by mouth daily.   . [DISCONTINUED] Chlorpheniramine-Acetaminophen (CORICIDIN HBP COLD/FLU PO) Take 1 tablet by mouth every 8 (eight) hours as needed (cold symptoms). Reported on 08/27/2015   . [DISCONTINUED] nystatin (MYCOSTATIN) 100000 UNIT/ML suspension Take 5 mLs (500,000 Units total) by mouth 4 (four) times daily. (Patient not taking: Reported on 08/27/2015)   . [DISCONTINUED] predniSONE (DELTASONE) 10 MG tablet Take 40 mg tablet 2/19 and taper down by 10 mg daily until completed    No facility-administered encounter medications on file as of 08/27/2015.   Allergies  Allergen Reactions  . Lisinopril Shortness Of Breath and Rash  . Contrast Media [Iodinated Diagnostic Agents] Other (See Comments)    Could not breath  . Iohexol Other (See Comments)    Immediately could not breathe  . Latex   . Levofloxacin Other (See Comments)    insomnia  . Sulfa Antibiotics Other (See Comments)    Historical from mother, pt states that mother says she almost died from this drug  . Codeine Nausea Only, Anxiety and Other (See Comments)    insomnia  . Lipitor [Atorvastatin Calcium] Rash   ROS: no fever, chills, shortness of breath, cough (improved). No nausea, vomiting, diarrhea, urinary complaints. No numbness, tingling, dizziness, headaches, no chest pain, palpitations. +sore throat and edema as per HPI.  PHYSICAL EXAM: BP 132/78 mmHg  Pulse 76  Temp(Src) 98.2 F (36.8 C) (Tympanic)  Ht _0  (1.626 m)  Wt 249 lb 6.4 oz (113.127 kg)  BMI 42.79 kg/m2  SpO2 97% Well appearing, pleasant, obese female in no distress She is speaking comfortably, appears well, slightly anxious HEENT: PERRL, EOMI,  conjunctiva and sclera are clear.  TM's and EAC's normal. Nasal mucosa normal, small amount of clear mucus. OP is clear--very small amount of white patches on the right, on soft palate above the tonsillar pillar. Normal on the left. Tongue is normal. Sinuses are nontender Heart: SEM, unchanged, regular rate and rhythm Lungs: decreased air movement, no wheezes, rales, ronchi Abdomen: soft, nontender, no mass Extremities: 1+ pitting edema with hyperpigmented changes bilaterally. Skin intact.  ASSESSMENT/PLAN:   Bilateral leg edema - lasix limit sodium, leg elevation. If BNP elevated, may need repeat echo sooner and f/u with Dr. Marlou Porch - Plan: Basic metabolic panel, CBC with Differential/Platelet, Brain natriuretic peptide, furosemide (LASIX) 20 MG tablet  Medication monitoring encounter - Plan: Basic metabolic panel, CBC with Differential/Platelet  Aortic stenosis - Plan: Brain natriuretic peptide  COPD with acute exacerbation ([place]) - due to influenza; improved with prednisone course  Thrombocytopenia (HCC) - mild. recheck - Plan: CBC with Differential/Platelet  Sore throat  Thrush, oral - very  mild, likely causing her sore throat symptoms. Nystatin - Plan: nystatin (MYCOSTATIN) 100000 UNIT/ML suspension  Influenza - resolved    b-met, CBC, BNP today start nystatin to treat presumptive thrush if sore throat persists. Continue rinsing mouth. Lasix 38m daily prn for edema. Dietary potassium reviewed.  F/u with Dr. SMarlou Porchif elevated BNP or ongoing edema.

## 2015-08-28 DIAGNOSIS — H2513 Age-related nuclear cataract, bilateral: Secondary | ICD-10-CM | POA: Diagnosis not present

## 2015-08-28 DIAGNOSIS — E119 Type 2 diabetes mellitus without complications: Secondary | ICD-10-CM | POA: Diagnosis not present

## 2015-08-28 DIAGNOSIS — H35363 Drusen (degenerative) of macula, bilateral: Secondary | ICD-10-CM | POA: Diagnosis not present

## 2015-08-28 DIAGNOSIS — Z7984 Long term (current) use of oral hypoglycemic drugs: Secondary | ICD-10-CM | POA: Diagnosis not present

## 2015-08-28 LAB — BRAIN NATRIURETIC PEPTIDE: Brain Natriuretic Peptide: 35.1 pg/mL (ref <100)

## 2015-08-28 LAB — HM DIABETES EYE EXAM

## 2015-09-03 ENCOUNTER — Encounter: Payer: Self-pay | Admitting: *Deleted

## 2015-09-19 ENCOUNTER — Telehealth: Payer: Self-pay | Admitting: Family Medicine

## 2015-09-19 ENCOUNTER — Telehealth: Payer: Self-pay | Admitting: *Deleted

## 2015-09-19 LAB — PULMONARY FUNCTION TEST

## 2015-09-19 MED ORDER — ALBUTEROL SULFATE HFA 108 (90 BASE) MCG/ACT IN AERS: INHALATION_SPRAY | RESPIRATORY_TRACT | Status: DC

## 2015-09-19 MED ORDER — BUDESONIDE-FORMOTEROL FUMARATE 160-4.5 MCG/ACT IN AERO: 2.0000 | INHALATION_SPRAY | Freq: Two times a day (BID) | RESPIRATORY_TRACT | Status: DC

## 2015-09-19 NOTE — Telephone Encounter (Signed)
Given sample to patient and patient notified.

## 2015-09-19 NOTE — Telephone Encounter (Signed)
[facility] for #1 local and #3 mail order

## 2015-09-19 NOTE — Telephone Encounter (Signed)
Pt requesting sample of Symbicort because she ran out today and she is waiting on her med to come in from mail order pharmacy. Pt said she is on this med due to a study. Call her back on cell #

## 2015-09-19 NOTE — Telephone Encounter (Signed)
Done

## 2015-09-19 NOTE — Telephone Encounter (Signed)
Patient called and needs albuterol inhaler called to [facility] [address]. She also asked if you could send rx to mail order as well , they are free through mail order but she is completely out and cannot wait only for the mail order. Please advise. Thanks.

## 2015-09-22 ENCOUNTER — Other Ambulatory Visit: Payer: Self-pay | Admitting: Family Medicine

## 2015-09-27 ENCOUNTER — Ambulatory Visit
Admission: RE | Admit: 2015-09-27 | Discharge: 2015-09-27 | Disposition: A | Discharge status: Home / Self Care | Payer: Commercial Managed Care - HMO | Source: Ambulatory Visit

## 2015-09-27 DIAGNOSIS — Z9011 Acquired absence of right breast and nipple: Secondary | ICD-10-CM

## 2015-09-27 DIAGNOSIS — Z1231 Encounter for screening mammogram for malignant neoplasm of breast: Secondary | ICD-10-CM

## 2015-10-04 ENCOUNTER — Telehealth: Payer: Self-pay | Admitting: Family Medicine

## 2015-10-04 MED ORDER — ALBUTEROL SULFATE (2.5 MG/3ML) 0.083% IN NEBU: 2.5000 mg | INHALATION_SOLUTION | RESPIRATORY_TRACT | Status: DC | PRN

## 2015-10-04 NOTE — Telephone Encounter (Signed)
Yes, with 2 refills

## 2015-10-04 NOTE — Telephone Encounter (Signed)
Pt requesting refill on Albuterol Nebulizer Solution

## 2015-10-04 NOTE — Telephone Encounter (Signed)
Is this okay to refill? 

## 2015-10-04 NOTE — Telephone Encounter (Signed)
Done

## 2015-10-08 ENCOUNTER — Ambulatory Visit (INDEPENDENT_AMBULATORY_CARE_PROVIDER_SITE_OTHER): Payer: Commercial Managed Care - HMO | Admitting: Family Medicine

## 2015-10-08 ENCOUNTER — Encounter: Payer: Self-pay | Admitting: Family Medicine

## 2015-10-08 ENCOUNTER — Other Ambulatory Visit: Payer: Self-pay | Admitting: Family Medicine

## 2015-10-08 VITALS — BP 136/80 | HR 92 | Temp 98.4°F | Ht 64.0 in | Wt 247.6 lb

## 2015-10-08 DIAGNOSIS — J441 Chronic obstructive pulmonary disease with (acute) exacerbation: Secondary | ICD-10-CM

## 2015-10-08 DIAGNOSIS — E119 Type 2 diabetes mellitus without complications: Secondary | ICD-10-CM

## 2015-10-08 DIAGNOSIS — R300 Dysuria: Secondary | ICD-10-CM | POA: Diagnosis not present

## 2015-10-08 LAB — POCT URINALYSIS DIPSTICK
Bilirubin, UA: NEGATIVE
Blood, UA: NEGATIVE
Ketones, UA: NEGATIVE
Leukocytes, UA: NEGATIVE
Nitrite, UA: NEGATIVE
Protein, UA: NEGATIVE
Spec Grav, UA: >=1.03
Urobilinogen, UA: NEGATIVE
pH, UA: 6

## 2015-10-08 LAB — GLUCOSE, POCT (MANUAL RESULT ENTRY): POC Glucose: 277 mg/dl — AB (ref 70–99)

## 2015-10-08 MED ORDER — BUDESONIDE-FORMOTEROL FUMARATE 160-4.5 MCG/ACT IN AERO: 2.0000 | INHALATION_SPRAY | Freq: Two times a day (BID) | RESPIRATORY_TRACT | Status: DC

## 2015-10-08 NOTE — Progress Notes (Signed)
Chief Complaint  Patient presents with  . Dysuria    started middle of last week. Feel like at the end of her stream she needs to stop bc it might burn, but it never does. Also explains while sitting here it feels like "after you've had sex that hurts."  . Cough    started last Wed, fever started this past Friday night, mucus is clear. Has some SOB with exertion.    3 days ago she started with low grade fever, Tmax up to 100.7. She started with a cough 5 days ago.  Phlegm is clear, thick. Denies runny nose.  Slight itchy eyes.  She has been using albuterol nebulizer twice daily--not as rescue, but trying to prevent her lungs from getting worse.  She used albuterol inhaler today--didn't have time for neb treatment, and she did notice slight wheezing today (no wheezing prior to today). She still feels slightly wheezy.  She feels like the nebulizer works better than the inhaler. She reports stopping her Symbicort when using the albuterol (wasn't aware that she could use both). Stopped Symbicort 2 days ago.  She recently got a sample, but needs a prescription sent to mail order pharmacy.  Urinary symptoms started around the same time, 5 days ago. No hurting or burning, but toward the end of the stream there is an urge to clamp down and stop the stream, due to concern that it might hurt.  No change in odor, color, no blood in the urine.  Denies vaginal discharge.  Fasting sugar this morning 165, usually running 140, per pt. Lab Results  Component Value Date   HGBA1C 7.2* 07/19/2015    Last ABX was z-pak and amox in mid-February. Also discharged from hospital on prednisone at that time. No further prednisone since then. She was seen in office 2/27 for hospital follow-up, with edema, and she was given rx for lasix 20mg .  Leg swelling is significantly better--only needed to take 3 tablets. Some swelling noted today, as she slept in her recliner last night (woke up coughing).  PMH, PSH, SH  reviewed  Outpatient Encounter Prescriptions as of 10/08/2015  Medication Sig Note  . albuterol (PROAIR HFA) 108 (90 Base) MCG/ACT inhaler INHALE 2 PUFFS INTO THE LUNGS EVERY 6 (SIX) HOURS AS NEEDED FOR WHEEZING.   Marland Kitchen aspirin EC 81 MG tablet Take 81 mg by mouth daily.     Marland Kitchen BAYER MICROLET LANCETS lancets Use as instructed   . bisoprolol-hydrochlorothiazide (ZIAC) 10-6.25 MG tablet Take 1 tablet by mouth daily.   . Calcium Carbonate-Vitamin D (CALCIUM 600+D) 600-400 MG-UNIT per tablet Take 1 tablet by mouth daily.    . cetirizine (KLS ALLER-TEC) 10 MG tablet Take 10 mg by mouth daily.   . Cholecalciferol (VITAMIN D) 2000 UNITS tablet Take 4,000 Units by mouth daily.   . fluticasone (FLONASE) 50 MCG/ACT nasal spray Place 2 sprays into both nostrils daily.   Marland Kitchen glucose blood (BAYER CONTOUR NEXT TEST) test strip Use once daily   . guaiFENesin (MUCINEX) 600 MG 12 hr tablet Take 1 tablet (600 mg total) by mouth 2 (two) times daily.   . metFORMIN (GLUCOPHAGE-XR) 500 MG 24 hr tablet Take 3 tablets (1,500 mg total) by mouth daily. (Patient taking differently: Take 500 mg by mouth 3 (three) times daily. )   . Multiple Vitamins-Minerals (CENTRUM SILVER PO) Take 1 tablet by mouth.     . Probiotic Product (PHILLIPS COLON HEALTH PO) Take 1 capsule by mouth daily.   . simvastatin (  ZOCOR) 20 MG tablet Take 1 tablet (20 mg total) by mouth at bedtime.   . valACYclovir (VALTREX) 500 MG tablet Take 1 tablet (500 mg total) by mouth daily. 08/16/2015: Pt taking daily-- long term use   . acetaminophen (TYLENOL) 325 MG tablet Take 650 mg by mouth every 6 (six) hours as needed for mild pain, fever or headache. Reported on 10/08/2015 05/15/2014: Uses prn for hand arthritis pain  . albuterol (PROVENTIL) (2.5 MG/3ML) 0.083% nebulizer solution Take 3 mLs (2.5 mg total) by nebulization every 4 (four) hours as needed for wheezing or shortness of breath. (Patient not taking: Reported on 10/08/2015)   . budesonide-formoterol  (SYMBICORT) 160-4.5 MCG/ACT inhaler Inhale 2 puffs into the lungs 2 (two) times daily. (Patient not taking: Reported on 10/08/2015) 10/08/2015: Stopped using 2 days ago (didn't realize she could use this along with the nebulizer)  . furosemide (LASIX) 20 MG tablet TAKE 1 TABLET BY MOUTH ONLY IF NEEDED FOR SWELLING (Patient not taking: Reported on 10/08/2015)   . guaiFENesin-dextromethorphan (ROBITUSSIN DM) 100-10 MG/5ML syrup Take 5 mLs by mouth every 4 (four) hours as needed for cough. (Patient not taking: Reported on 10/08/2015)   . [DISCONTINUED] Investigational - Study Medication Inhale 2 puffs into the lungs 2 (two) times daily. Additional Study Details: PT [id number] Rescue inhaler, double blind study   . [DISCONTINUED] nystatin (MYCOSTATIN) 100000 UNIT/ML suspension Take 5 mLs (500,000 Units total) by mouth 4 (four) times daily.    No facility-administered encounter medications on file as of 10/08/2015.   Allergies  Allergen Reactions  . Lisinopril Shortness Of Breath and Rash  . Contrast Media [Iodinated Diagnostic Agents] Other (See Comments)    Could not breath  . Iohexol Other (See Comments)    Immediately could not breathe  . Latex   . Levofloxacin Other (See Comments)    insomnia  . Sulfa Antibiotics Other (See Comments)    Historical from mother, pt states that mother says she almost died from this drug  . Codeine Nausea Only, Anxiety and Other (See Comments)    insomnia  . Lipitor [Atorvastatin Calcium] Rash   ROS:  No nausea, vomiting, diarrhea, bleeding, bruising, rash. No headaches, dizziness, chest pain. Slight edema today as per HPI.  No vaginal discharge, odor, itch. +low grade fever, urinary and upper respiratory symptoms as per HPI  PHYSICAL EXAM: BP 136/80 mmHg  Pulse 92  Temp(Src) 98.4 F (36.9 C) (Tympanic)  Ht 5\' 4"  (1.626 m)  Wt 247 lb 9.6 oz (112.311 kg)  BMI 42.48 kg/m2  SpO2 98% Well developed, well appearing, pleasant female in no distress. No coughing;  speaking easily in full sentences HEENT: PERRL, EOMI, conjunctiva and sclera are clear. OP clear, moist mucus membranes, no thrush.  Sinuses nontender, nasal mucosa is normal, no erythema or purulence Neck: no lymphadenopathy, thyromegaly or mass Heart: regular rate and rhythm, +systolic ejection murmur, unchanged Lungs: Mild diffuse wheezing (expiratory); fair air flow. No rales, ronchi Extremities: trace to 1+ edema at ankles/pre-tibial Neuro: alert and oriented, cranial nerves intact, normal strength, gait Psych: normal mood, affect, hygiene and grooming  Urine dip: SG >=1.030, neg leuks and nitrite, no blood, protein. 2+ glu  Ate 1.25 hours ago Glu 277 here in office  ASSESSMENT/PLAN:  Dysuria - no UTI per dip. +glucosuria with elevated BS. f/u if worsening sx - Plan: POCT Urinalysis Dipstick  COPD with acute exacerbation (HCC) - restart Symbicort and use BID regularly, along with albuterol (neb or inhaler) prn; will need repeat  spirometry when back on meds (next visit) - Plan: budesonide-formoterol (SYMBICORT) 160-4.5 MCG/ACT inhaler  Type 2 diabetes mellitus without complication, without long-term current use of insulin (HCC) - elevated sugar today; advised to check 2hrs PP or at bedtime, rather than just fasting. Bring list to med check next month - Plan: Glucose (CBG)   Declines nebulizer treatment in office.   Drink plenty of fluids (your urine was very concentrated, meaning a little dehydrated). Continue the albuterol (inhaler or nebulizer) as needed for wheezing. RESTART SYMBICORT--this can safely be used along with albuterol, and shouldn't be stopped. If you don't feel like the wheezing improves within 1-2 days of getting this back on board (and continuing the albuterol), call for steroids (prednisone). Keep track of your temperature--if you have ongoing fevers, let us know.  Your urine didn't show infection.  If you have worsening symptoms--increasing pain, blood in the  urine, odor to the urine, please return for recheck.  Your sugar was high today.  This may be due to your current illness.  Please try and check your sugars in the evenings, not just in the morning (any time 2 hours after a meal).  Write these values down, and bring your sugar list to your next visit next month.  (ideally, use a log with columns for the date, morning, evening, comment section (to explain highs/lows, ie if you forget medicine, or ate birthday cake)   F/u as scheduled in May.

## 2015-10-08 NOTE — Patient Instructions (Addendum)
  Drink plenty of fluids (your urine was very concentrated, meaning a little dehydrated). Continue the albuterol (inhaler or nebulizer) as needed for wheezing. RESTART SYMBICORT--this can safely be used along with albuterol, and shouldn't be stopped. If you don't feel like the wheezing improves within 1-2 days of getting this back on board (and continuing the albuterol), call for steroids (prednisone). Keep track of your temperature--if you have ongoing fevers, let us know.  Your urine didn't show infection.  If you have worsening symptoms--increasing pain, blood in the urine, odor to the urine, please return for recheck.   Your sugar was high today.  This may be due to your current illness.  Please try and check your sugars in the evenings, not just in the morning (any time 2 hours after a meal).  Write these values down, and bring your sugar list to your next visit next month.  (ideally, use a log with columns for the date, morning, evening, comment section (to explain highs/lows, ie if you forget medicine, or ate birthday cake)

## 2015-10-15 ENCOUNTER — Other Ambulatory Visit: Payer: Self-pay | Admitting: Family Medicine

## 2015-11-08 ENCOUNTER — Encounter: Payer: Self-pay | Admitting: Family Medicine

## 2015-11-08 ENCOUNTER — Ambulatory Visit (INDEPENDENT_AMBULATORY_CARE_PROVIDER_SITE_OTHER): Payer: Commercial Managed Care - HMO | Admitting: Family Medicine

## 2015-11-08 VITALS — BP 130/74 | HR 72 | Ht 64.0 in | Wt 239.0 lb

## 2015-11-08 DIAGNOSIS — E119 Type 2 diabetes mellitus without complications: Secondary | ICD-10-CM | POA: Diagnosis not present

## 2015-11-08 DIAGNOSIS — J449 Chronic obstructive pulmonary disease, unspecified: Secondary | ICD-10-CM | POA: Diagnosis not present

## 2015-11-08 DIAGNOSIS — M79671 Pain in right foot: Secondary | ICD-10-CM

## 2015-11-08 DIAGNOSIS — I10 Essential (primary) hypertension: Secondary | ICD-10-CM | POA: Diagnosis not present

## 2015-11-08 LAB — POCT GLYCOSYLATED HEMOGLOBIN (HGB A1C): Hemoglobin A1C: 6.3

## 2015-11-08 NOTE — Progress Notes (Signed)
Chief Complaint  Patient presents with  . Diabetes    fasting med check and also has had "shaking incidents." First time at [employer] study, she was freezing cold and started shaking-hands and upper body, could not stop. Finally stopped, had no appetite after and was drained. Happened again Sunday April 30th-started with a shiver/quiver-could not stop shaking again-sister described it like an epileptic seizure but she was coherent. Someone told her about core body temp dropping.    Shaking episodes x2 as per chief complaint. They checked her sugar when it occurred at [facility], and was told that her blood sugar was fine.   Episode at home on 4/30 was similar--called Dr. Redmond [employer] after hours, sugar was 125.  She describes a shivering sensation in her upper chest, above the clavicles.  Shaking in the upper arms. Quits on its own.  First episode lasted 30 mins, second 45 min. She felt freezing cold.  Unsure if she could have had a fever (thermometer was broken)--thinks she may have.  Got a new thermometer and had low grade fever the following day.  She never ended up getting sick or other symptoms. Has been fine since then. 02 sat was 96%.  Didn't check BP or pulse. Sugar was normal  She continues to feel cold frequently, husband notices, but no further episodes where it was more severe. She often feels colder than her husband. Thyroid was routinely checked 3 months ago and was normal. Lab Results  Component Value Date   TSH 1.268 08/16/2015   Seen last month with COPD flare. Had just finished study, and was restarted on symbicort.  Due for spirometry today. Currently denies any respiratory complaints. No further nosebleeds since being off study medication (had frequently while taking it).  Diabetes: Blood sugars at home are running around 125-153, fasting. 103-139 at least 2-3 hours after eating. Denies hypoglycemia. Denies polydipsia and polyuria. Last eye exam was February 2017. She has cataracts,  no new vision changes. Patient follows a low sugar diet and checks feet regularly without concerns. Denies numbness, tingling.  She has noticed a sore on her right foot.  Hasn't resumed an exercise program yet. She has been sticking with weight watchers, and has been losing weight.  Lost 14# since January.  She meets her sister at the [facility].  She hasn't resumed going to [facility], in [place], similar to [employer]. She plans to restart.   Hyperlipidemia follow-up: Patient is reportedly following a low-fat, low cholesterol diet. Compliant with medications and denies medication side effects.   Hypertension: Blood pressure hasn't been checked at home recently (previously used husband's arm monitor). Denies headaches, dizziness, chest pain. Denies side effects of medications. Compliant with medication.  PMH, PSH, SH reviewed  Outpatient Encounter Prescriptions as of 11/08/2015  Medication Sig Note  . acetaminophen (TYLENOL) 325 MG tablet Take 650 mg by mouth every 6 (six) hours as needed for mild pain, fever or headache. Reported on 10/08/2015 05/15/2014: Uses prn for hand arthritis pain  . albuterol (PROAIR HFA) 108 (90 Base) MCG/ACT inhaler INHALE 2 PUFFS INTO THE LUNGS EVERY 6 (SIX) HOURS AS NEEDED FOR WHEEZING. 11/08/2015: Uses 2-3 times a week when she goes out  . aspirin EC 81 MG tablet Take 81 mg by mouth daily.     . bisoprolol-hydrochlorothiazide (ZIAC) 10-6.25 MG tablet Take 1 tablet by mouth daily.   . Blood Glucose Monitoring Suppl (TRUE METRIX AIR GLUCOSE METER) w/Device KIT USE AS DIRECTED   . budesonide-formoterol (  SYMBICORT) 160-4.5 MCG/ACT inhaler Inhale 2 puffs into the lungs 2 (two) times daily.   . Calcium Carbonate-Vitamin D (CALCIUM 600+D) 600-400 MG-UNIT per tablet Take 1 tablet by mouth daily.    . cetirizine (KLS ALLER-TEC) 10 MG tablet Take 10 mg by mouth daily.   . Cholecalciferol (VITAMIN D) 2000 UNITS tablet Take 4,000 Units by mouth daily.   .  metFORMIN (GLUCOPHAGE-XR) 500 MG 24 hr tablet Take 3 tablets (1,500 mg total) by mouth daily. (Patient taking differently: Take 500 mg by mouth 3 (three) times daily. )   . Multiple Vitamins-Minerals (CENTRUM SILVER PO) Take 1 tablet by mouth.     . simvastatin (ZOCOR) 20 MG tablet Take 1 tablet (20 mg total) by mouth at bedtime.   . valACYclovir (VALTREX) 500 MG tablet Take 1 tablet (500 mg total) by mouth daily. 08/16/2015: Pt taking daily-- long term use   . [DISCONTINUED] BAYER MICROLET LANCETS lancets Use as instructed   . [DISCONTINUED] Probiotic Product (PHILLIPS COLON HEALTH PO) Take 1 capsule by mouth daily.   Marland Kitchen albuterol (PROVENTIL) (2.5 MG/3ML) 0.083% nebulizer solution USE 1 VIAL BY NEBULIZATION EVERY FOUR HOURS AS NEEDED FOR WHEEZING OR SHORTNESS OF BREATH. (Patient not taking: Reported on 11/08/2015)   . fluticasone (FLONASE) 50 MCG/ACT nasal spray Place 2 sprays into both nostrils daily. (Patient not taking: Reported on 11/08/2015)   . furosemide (LASIX) 20 MG tablet TAKE 1 TABLET BY MOUTH ONLY IF NEEDED FOR SWELLING (Patient not taking: Reported on 10/08/2015)   . [DISCONTINUED] glucose blood (BAYER CONTOUR NEXT TEST) test strip Use once daily   . [DISCONTINUED] guaiFENesin (MUCINEX) 600 MG 12 hr tablet Take 1 tablet (600 mg total) by mouth 2 (two) times daily.   . [DISCONTINUED] guaiFENesin-dextromethorphan (ROBITUSSIN DM) 100-10 MG/5ML syrup Take 5 mLs by mouth every 4 (four) hours as needed for cough. (Patient not taking: Reported on 10/08/2015)    No facility-administered encounter medications on file as of 11/08/2015.   Allergies  Allergen Reactions  . Lisinopril Shortness Of Breath and Rash  . Contrast Media [Iodinated Diagnostic Agents] Other (See Comments)    Could not breath  . Iohexol Other (See Comments)    Immediately could not breathe  . Latex   . Levofloxacin Other (See Comments)    insomnia  . Sulfa Antibiotics Other (See Comments)    Historical from mother, pt  states that mother says she almost died from this drug  . Codeine Nausea Only, Anxiety and Other (See Comments)    insomnia  . Lipitor [Atorvastatin Calcium] Rash    ROS: no headaches, dizziness, chest pain, shortness of breath, urinary complaints, bowel changes, bleeding (nosebleeds stopped after stopping study med), bruising or rash.  Occasional twinges of knee pain, no other joint pains.  Moods are good. Denies URI or allergy complaints; feels like her breathing is good, back to baseline.  Denies thrush, sore throat.  PHYSICAL EXAM: BP 130/74 mmHg  Pulse 72  Ht '5\' 4"'  (1.626 m)  Wt 239 lb (108.41 kg)  BMI 41.00 kg/m2 Well developed, pleasant, obese female in no distress.   HEENT: PERRL ,EOMI, conjunctiva and sclera are clear Neck: no lymphadenopathy, thyromegaly or mass Heart: regular rate and rhythm, with 3/6 SEM, unchanged Lungs--distant, but clear. No wheezes, rales ,ronchi Right foot--tender nodule on bottom of forefoot.  No skin abnormality noted.  Normal monofilament sensation No edema, normal pulses. hyperpigmentation Psych: normal mood, affect, hygiene and grooming   Lab Results  Component Value Date  HGBA1C 6.3 11/08/2015   A1c down from 7.2 at last visit in January.  Spirometry--somewhat improved from last year, but still shows moderately severe obstruction, with low vital capacity.   ASSESSMENT/PLAN:  Diabetes mellitus without complication ([place]) - Plan: HgB A1c, Ambulatory referral to Podiatry  Essential hypertension, benign - controlled  Type 2 diabetes mellitus without complication, without long-term current use of insulin (HCC) - improved control. continue weight loss, diet; daily exercise encouraged  Right foot pain - tender nodule--refer to podiatry - Plan: Ambulatory referral to Podiatry  COPD without exacerbation ([place]) - some improvement to spirometry, but still mod-severe obstruction. Pt plans to check back with [facility] re: upcoming studies - Plan:  Spirometry with Graph  Morbid obesity, unspecified obesity type ([place]) - some weight loss on Weight Watchers--continue, and restart exercise regimen   Refer to podiatrist--painful foot nodule in diabetic.  DM improved--continue weight loss. Resume exercise program.  May be candidate for another COPD study in the fall--will check back with [facility].   Suspect possible fever contriubting to shaking spells. Trial of tylenol Other things ruled out.  F/u if/when occurs for re-evaluation.  TSH recently normal.  No current e/o infection; last episode almost 2 weeks ago.

## 2015-11-08 NOTE — Patient Instructions (Signed)
Continue your current medications. Continue Weight Watchers and weight loss. Resume your exercise program. If you have recurrent shaking spell, check temperature and try tylenol. Return if having ongoing problems/episodes.  We are referring you to [place] to evaluate the painful lump in your right foot.

## 2015-11-12 ENCOUNTER — Encounter: Payer: Self-pay | Admitting: Family Medicine

## 2015-11-15 ENCOUNTER — Telehealth: Payer: Self-pay | Admitting: *Deleted

## 2015-11-15 NOTE — Telephone Encounter (Signed)
Advise pt no--continue 3 metformin.  Metformin alone shouldn't cause significant hypoglycemia (only when combined with insulin or glipizide).  If she has sugars <60, or if she is having SYMPTOMS (shaky, sweaty, etc) with lower sugars, let us know. Sugars over 70 are NORMAL

## 2015-11-15 NOTE — Telephone Encounter (Signed)
Patient called and left a message on my voicemail stating that she was increased to 3 metformin daily. Fasting in the am her sugar is 100-102. 3 hrs after eating in the afternoon her sugars are 85-87. She is afraid they are getting too low. Wonders if she should stop taking the 3rd metformin?

## 2015-11-15 NOTE — Telephone Encounter (Signed)
Left message for patient and told her to call me if she has any questions.

## 2015-11-19 ENCOUNTER — Ambulatory Visit (INDEPENDENT_AMBULATORY_CARE_PROVIDER_SITE_OTHER): Payer: Commercial Managed Care - HMO | Admitting: Podiatry

## 2015-11-19 ENCOUNTER — Ambulatory Visit (INDEPENDENT_AMBULATORY_CARE_PROVIDER_SITE_OTHER): Payer: Commercial Managed Care - HMO

## 2015-11-19 ENCOUNTER — Encounter: Payer: Self-pay | Admitting: Podiatry

## 2015-11-19 VITALS — BP 125/60 | HR 67 | Resp 16 | Ht 64.0 in | Wt 236.0 lb

## 2015-11-19 DIAGNOSIS — E119 Type 2 diabetes mellitus without complications: Secondary | ICD-10-CM | POA: Diagnosis not present

## 2015-11-19 DIAGNOSIS — Q828 Other specified congenital malformations of skin: Secondary | ICD-10-CM | POA: Diagnosis not present

## 2015-11-19 NOTE — Progress Notes (Signed)
   Subjective:    Patient ID: Shannon Schultz, female    DOB: 1946-04-21, 70 y.o.   MRN: [id number]  HPI  "I have hard places on the bottom of my foot and around the heel.  My doctor wanted me to have my feet checked out since I'm Diabetic.  It was tender to touch but there wasn't any open places.  I haven't done anything to treat them."    Review of Systems  Constitutional: Positive for unexpected weight change.  HENT: Positive for tinnitus.   Respiratory: Positive for shortness of breath.   Musculoskeletal: Positive for joint swelling.  Allergic/Immunologic: Positive for environmental allergies.  All other systems reviewed and are negative.      Objective:   Physical Exam        Assessment & Plan:

## 2015-11-21 NOTE — Progress Notes (Signed)
Subjective:     Patient ID: Shannon Schultz, female   DOB: 11-17-1945, 70 y.o.   MRN: [id number]  HPI patient states she has several places on the bottom of the foot that she wanted to check to make sure they wart damaged. She has very dry skin and has long-term history of diabetes with obesity   Review of Systems  All other systems reviewed and are negative.      Objective:   Physical Exam  Constitutional: She is oriented to person, place, and time.  Cardiovascular: Intact distal pulses.   Musculoskeletal: Normal range of motion.  Neurological: She is oriented to person, place, and time.  Skin: Skin is warm and dry.  Nursing note and vitals reviewed.  vascular status DP PT pulses intact with patient noted to have mild reduction sharp dull and vibratory bilateral with signs Weinstein testing which is normal. Patient's noted to have dry skin plantarly it appears to have small porokeratotic lesions which at this time her nonsymptomatic and I was unable to identify. Patient's found to have good digital perfusion well oriented 3     Assessment:     Long-term diabetic with very dry skin and probable history of porokeratotic lesions    Plan:     H&P conditions reviewed and debrided lesions as needed and instructed on daily inspections of her feet and diabetic foot exams

## 2015-12-11 DIAGNOSIS — K7581 Nonalcoholic steatohepatitis (NASH): Secondary | ICD-10-CM | POA: Diagnosis not present

## 2015-12-11 DIAGNOSIS — K439 Ventral hernia without obstruction or gangrene: Secondary | ICD-10-CM | POA: Diagnosis not present

## 2015-12-11 DIAGNOSIS — K5730 Diverticulosis of large intestine without perforation or abscess without bleeding: Secondary | ICD-10-CM | POA: Diagnosis not present

## 2015-12-11 DIAGNOSIS — Z1211 Encounter for screening for malignant neoplasm of colon: Secondary | ICD-10-CM | POA: Diagnosis not present

## 2015-12-11 DIAGNOSIS — Z86010 Personal history of colonic polyps: Secondary | ICD-10-CM | POA: Diagnosis not present

## 2015-12-12 ENCOUNTER — Telehealth: Payer: Self-pay | Admitting: *Deleted

## 2015-12-12 MED ORDER — PROAIR HFA 108 (90 BASE) MCG/ACT IN AERS: INHALATION_SPRAY | RESPIRATORY_TRACT | Status: DC

## 2015-12-12 NOTE — Telephone Encounter (Signed)
Patient called and does not like Ventolin inhaler, prefers ProAir. Looks like ProAir was sent to [facility] but they changed to Ventolin due to cost. She would like ProAir sent to local [facility] and she will pay for it if needed.

## 2015-12-12 NOTE — Telephone Encounter (Signed)
Ok.  Please send rx for ProAir

## 2015-12-12 NOTE — Telephone Encounter (Signed)
Done

## 2015-12-17 ENCOUNTER — Other Ambulatory Visit: Payer: Self-pay | Admitting: Family Medicine

## 2015-12-22 ENCOUNTER — Other Ambulatory Visit: Payer: Self-pay | Admitting: Family Medicine

## 2016-01-23 DIAGNOSIS — K5730 Diverticulosis of large intestine without perforation or abscess without bleeding: Secondary | ICD-10-CM | POA: Diagnosis not present

## 2016-01-23 DIAGNOSIS — Z86010 Personal history of colonic polyps: Secondary | ICD-10-CM | POA: Diagnosis not present

## 2016-01-23 DIAGNOSIS — Z1211 Encounter for screening for malignant neoplasm of colon: Secondary | ICD-10-CM | POA: Diagnosis not present

## 2016-01-23 LAB — HM COLONOSCOPY

## 2016-02-11 ENCOUNTER — Other Ambulatory Visit: Payer: Commercial Managed Care - HMO

## 2016-02-11 DIAGNOSIS — E119 Type 2 diabetes mellitus without complications: Secondary | ICD-10-CM

## 2016-02-11 DIAGNOSIS — Z5181 Encounter for therapeutic drug level monitoring: Secondary | ICD-10-CM

## 2016-02-11 LAB — COMPREHENSIVE METABOLIC PANEL
ALT: 23 U/L (ref 6–29)
AST: 27 U/L (ref 10–35)
Albumin: 4.2 g/dL (ref 3.6–5.1)
Alkaline Phosphatase: 52 U/L (ref 33–130)
BUN: 20 mg/dL (ref 7–25)
CO2: 27 mmol/L (ref 20–31)
Calcium: 9.7 mg/dL (ref 8.6–10.4)
Chloride: 103 mmol/L (ref 98–110)
Creat: 0.78 mg/dL (ref 0.50–0.99)
Glucose, Bld: 131 mg/dL — ABNORMAL HIGH (ref 65–99)
Potassium: 4.9 mmol/L (ref 3.5–5.3)
Sodium: 140 mmol/L (ref 135–146)
Total Bilirubin: 0.6 mg/dL (ref 0.2–1.2)
Total Protein: 6.6 g/dL (ref 6.1–8.1)

## 2016-02-11 LAB — TSH: TSH: 2.42 mIU/L

## 2016-02-12 LAB — HEMOGLOBIN A1C
Hgb A1c MFr Bld: 5.6 % (ref <5.7)
Mean Plasma Glucose: 114 mg/dL

## 2016-02-13 NOTE — Progress Notes (Signed)
Chief Complaint  Patient presents with  . Medicare Wellness    nonfasting med check plus/AWV with pelvic exam. No concerns.  . Other    requesting handicapped placard.    Shannon Schultz is a 70 y.o. female who presents for annual wellness visit and follow-up on chronic medical conditions.  She has the following concerns:  Requesting handicapped placard. Sometimes if she doesn't get to church early enough, if she has to park down below, she gets very winded walking up the incline. That is really the only time she plans to use it.  Diabetes: Blood sugars at home are running 105-130 in the mornings, usually <120.  She stopped checking in the afternoons (had been 80's-120).  Denies hypoglycemia. Denies polydipsia and polyuria. Last eye exam was February 2017. She has cataracts, no new vision changes. Patient follows a low sugar diet and checks feet regularly without concerns. Denies numbness, tingling. She saw podiatrist.    Hasn't resumed an exercise program yet, has some intermittent knee pain.  She still plans are restarting [facility], but hasn't started yet (in [place]).   She has been sticking with weight watchers, and has been losing weight.   She meets her sister at the [facility] in [facility].    She continues to lose weight.  She lost an additional 7 pounds in the last 3 months.  Hyperlipidemia follow-up: Patient is reportedly following a low-fat, low cholesterol diet. Compliant with medications and denies medication side effects.   Hypertension: Blood pressure is periodically checked at home, running 130/70's.  Denies headaches, dizziness, chest pain. Denies side effects of medications. Compliant with medication.  COPD:  Overall her breathing is very good.  Today, due to heat and humidity, she needed to use her inhaler before going outside. Uses it about twice a week, when going out. Gets short of breath going up stairs, and walking up the incline in parking garage at church. Had  spirometry at last visit (10/2015), showing moderately severe obstruction, with low vital capacity.   Immunization History  Administered Date(s) Administered  . Influenza Split 05/28/2011  . Influenza, High Dose Seasonal PF 04/07/2013, 05/15/2014, 05/30/2015  . Influenza, Seasonal, Injecte, Preservative Fre 06/07/2012  . Pneumococcal Conjugate-13 07/06/2014  . Pneumococcal Polysaccharide-23 12/15/2004, 05/28/2011  . Tdap 02/23/2008  . Zoster 05/17/2010   Last Pap smear:  06/2013--normal, no high risk HPV detected Last mammogram: 08/2015  Last colonoscopy: 12/2015 with Dr. Mayo Ao report in chart.  She states she was told to have another in 7 years. Last DEXA: 12/2014 normal Dentist: twice a year Ophtho: yearly, 08/2015 Exercise:  none currently.   Negative fall and depression screen. Function status screen notable for dyspnea on stairs, as well as some knee pain with stairs, "constant roar in her ears" (has seen ENT for this years ago--no change; some slight hearing loss); has cataracts--foggy vision at night  Dermatologist--can't remember name, hasn't gone in a while Dentist--[facility] Elizebeth [facility]oking [facility]--Dr. Collene Mares Cardiologist: Dr. Marlou Porch  End of Life Discussion:  Patient does not have a living will and medical power of attorney.  She still has the paperwork from last year, needs to complete.  Past Medical History:  Diagnosis Date  . Allergy   . Cancer ([place]) 1992   R breast, DCIS  . Colon polyp   . COPD (chronic obstructive pulmonary disease) ([place])   . Diverticulosis   . Elevated cholesterol   . Elevated liver enzymes    fatty liver  per ultrasound per pt  . Family history of malignant neoplasm of breast   . FHx: BRCA2 gene positive    sister with BRCA2 mutation (pt tested NEGATIVE)  . Genital HSV    gets on hip  . Heart murmur     echo 05/2011 mild aortic stenosis  . HSV (herpes simplex virus) infection    on hip--on daily suppression  .  Hypertension   . Impaired glucose tolerance   . Migraine   . Osteopenia   . Pneumonia 06/06/2011  . Type 2 diabetes mellitus ([place])   . Vitamin D deficiency disease     Past Surgical History:  Procedure Laterality Date  . CHOLECYSTECTOMY  2003  . COLONOSCOPY  2009, 08/2010, 12/2015   Dr. Collene Mares  . HERNIA REPAIR  [id number]   umbilical  . MASTECTOMY Right 1992   R breast for cancer    Social History   Social History  . Marital status: Married    Spouse name: N/A  . Number of children: 3  . Years of education: N/A   Occupational History  . Retired    Social History Main Topics  . Smoking status: Former Smoker    Packs/day: 1.00    Years: 46.00    Types: Cigarettes    Quit date: 05/31/2011  . Smokeless tobacco: Never Used  . Alcohol use No     Comment: rarely, 1 drink per year maybe  . Drug use: No  . Sexual activity: Yes    Partners: Male    Birth control/ protection: Post-menopausal   Other Topics Concern  . Not on file   Social History Narrative   Lives with her husband.  Children all live in [place] nearby. No pets. 5 grandchildren    Family History  Problem Relation Age of Onset  . Heart disease Father   . Diabetes Father   . Hypertension Father   . Asthma Sister   . Allergies Sister   . Hyperlipidemia Sister   . Hashimoto's thyroiditis Sister   . Heart disease Mother     tachycardia  . Asthma Sister   . Allergies Sister   . Hyperlipidemia Sister   . Hashimoto's thyroiditis Sister   . Breast cancer Sister 36    BRCA2 positive; metastatic to bones at age 71  . Cancer Other     female cancers, bone cancer  . Cancer Maternal Grandmother     deceased 33; unk. primary; possibly stomach  . Tuberculosis Maternal Grandfather   . Stroke Paternal Grandmother 19    died of cerebral hemorrhage  . Breast cancer Other     Outpatient Encounter Prescriptions as of 02/14/2016  Medication Sig Note  . aspirin EC 81 MG tablet Take 81 mg by mouth daily.     .  bisoprolol-hydrochlorothiazide (ZIAC) 10-6.25 MG tablet TAKE 1 TABLET EVERY DAY   . Blood Glucose Monitoring Suppl (TRUE METRIX AIR GLUCOSE METER) w/Device KIT USE AS DIRECTED   . budesonide-formoterol (SYMBICORT) 160-4.5 MCG/ACT inhaler Inhale 2 puffs into the lungs 2 (two) times daily.   . Calcium Carbonate-Vitamin D (CALCIUM 600+D) 600-400 MG-UNIT per tablet Take 1 tablet by mouth daily.    . cetirizine (KLS ALLER-TEC) 10 MG tablet Take 10 mg by mouth daily.   . Cholecalciferol (VITAMIN D) 2000 UNITS tablet Take 4,000 Units by mouth daily.   . metFORMIN (GLUCOPHAGE-XR) 500 MG 24 hr tablet Take 3 tablets (1,500 mg total) by mouth daily. (Patient taking differently: Take 500 mg by  mouth 3 (three) times daily. )   . Multiple Vitamins-Minerals (CENTRUM SILVER PO) Take 1 tablet by mouth.     Marland Kitchen PROAIR HFA 108 (90 Base) MCG/ACT inhaler INHALE 2 PUFFS INTO THE LUNGS EVERY 6 (SIX) HOURS AS NEEDED FOR WHEEZING.   . simvastatin (ZOCOR) 20 MG tablet TAKE 1 TABLET AT BEDTIME   . valACYclovir (VALTREX) 500 MG tablet Take 1 tablet (500 mg total) by mouth daily. 08/16/2015: Pt taking daily-- long term use   . acetaminophen (TYLENOL) 325 MG tablet Take 650 mg by mouth every 6 (six) hours as needed for mild pain, fever or headache. Reported on 10/08/2015 05/15/2014: Uses prn for hand arthritis pain  . albuterol (PROVENTIL) (2.5 MG/3ML) 0.083% nebulizer solution USE 1 VIAL BY NEBULIZATION EVERY FOUR HOURS AS NEEDED FOR WHEEZING OR SHORTNESS OF BREATH. (Patient not taking: Reported on 11/08/2015)   . fluticasone (FLONASE) 50 MCG/ACT nasal spray Place 2 sprays into both nostrils daily. (Patient not taking: Reported on 11/08/2015) 02/14/2016: Uses seasonally, prn  . furosemide (LASIX) 20 MG tablet TAKE 1 TABLET BY MOUTH ONLY IF NEEDED FOR SWELLING (Patient not taking: Reported on 10/08/2015)    No facility-administered encounter medications on file as of 02/14/2016.     Allergies  Allergen Reactions  . Lisinopril  Shortness Of Breath and Rash  . Contrast Media [Iodinated Diagnostic Agents] Other (See Comments)    Could not breath  . Iohexol Other (See Comments)    Immediately could not breathe  . Latex   . Levofloxacin Other (See Comments)    insomnia  . Sulfa Antibiotics Other (See Comments)    Historical from mother, pt states that mother says she almost died from this drug  . Codeine Nausea Only, Anxiety and Other (See Comments)    insomnia  . Lipitor [Atorvastatin Calcium] Rash     ROS: The patient denies anorexia, fever, vision changes (foggy vision at night related to cataracts), decreased hearing (slight loss, chronic tinnitus/"roar"), ear pain, sore throat, breast concerns, chest pain, palpitations, dizziness, syncope, cough, swelling, nausea, vomiting, constipation, abdominal pain, melena, hematochezia, indigestion/heartburn, hematuria, incontinence, dysuria, vaginal bleeding, discharge, odor or itch, genital lesions, numbness (previously had L-sided facial numbness x 5-10 mins very sporadically, hasn't had it recently; never had other associated symptoms--no weakness, slurred speech, or associated with headache), tingling, weakness, tremor, suspicious skin lesions, depression, anxiety, abnormal bleeding/bruising, or enlarged lymph nodes. Denies any memory concerns. Some chronic mild allergies. No migraines Only rare gassiness/diarrhea (related to metformin--had more diarrhea when all tablets taken together; tolerable taking it TID) Some right knee pain (intermittent), hand pain from arthritis, mostly in the thumbs Intermittent hoarseness and itchy left ear (external ear) +intentional weight loss   PHYSICAL EXAM:  BP 136/74 (BP Location: Left Arm, Patient Position: Sitting, Cuff Size: Normal)   Pulse 84   Ht '5\' 4"'  (1.626 m)   Wt 228 lb 9.6 oz (103.7 kg)   BMI 39.24 kg/m    General Appearance:  Alert, cooperative, no distress, appears stated age   Head:  Normocephalic, without  obvious abnormality, atraumatic   Eyes:  PERRL, conjunctiva/corneas clear, EOM's intact, fundi  benign   Ears:  Normal TM's and external ear canals   Nose:  Nares normal, mucosa normal, no drainage or sinus tenderness   Throat:  Lips, mucosa, and tongue normal; teeth and gums normal   Neck:  Supple, no lymphadenopathy; thyroid: no enlargement/tenderness/nodules; no carotid  bruit or JVD   Back:  Spine nontender, no curvature, ROM  normal, no CVA tenderness   Lungs:  Clear to auscultation bilaterally without wheezes, rales or ronchi; respirations unlabored   Chest Wall:  No tenderness or deformity (other than absence of R breast, WHSS)   Heart:  Regular rate and rhythm, S1 and S2 normal, no rub  or gallop. 2-3/6 SEM loudest at RUSB, and radiates into R carotid   Breast Exam:  No tenderness, masses, or nipple discharge or inversion of L breast. No axillary lymphadenopathy. Right breast is absent.   Abdomen:  Soft, non-tender, nondistended, normoactive bowel sounds,  no masses, no hepatosplenomegaly. + abdominal obesity. Abdominal/ventral hernia, nontender.  Genitalia:  Normal external genitalia without lesions. BUS and vagina normal; No cervical motion tenderness. No abnormal vaginal discharge. Uterus and adnexa not enlarged, nontender, no masses, although exam is limited by body habitus. Pap not performed   Rectal:  Deferred (had recent colonoscopy)  Extremities:  Trace pretibial edema. Normal diabetic foot exam   Pulses:  2+ and symmetric all extremities   Skin:  Skin color, texture, turgor normal, no rashes or lesions. Venous stasis changes bilateral lower legs with (hyperpigmented diffusely); some prominent superficial veins in feet giving bluish discoloration. Feet are very dry. Normal sensation to monofilament  Lymph nodes:  Cervical, supraclavicular, and axillary nodes normal   Neurologic:  CNII-XII intact, normal strength, sensation and gait; reflexes  2+ and symmetric throughout    Psych: Normal mood, affect, hygiene and grooming  Diabetic foot exam performed   Fasting glucose 131 (on 02/11/16) Lab Results  Component Value Date   HGBA1C 5.6 02/11/2016   Lab Results  Component Value Date   TSH 2.42 02/11/2016     Chemistry      Component Value Date/Time   NA 140 02/11/2016 0746   K 4.9 02/11/2016 0746   CL 103 02/11/2016 0746   CO2 27 02/11/2016 0746   BUN 20 02/11/2016 0746   CREATININE 0.78 02/11/2016 0746      Component Value Date/Time   CALCIUM 9.7 02/11/2016 0746   ALKPHOS 52 02/11/2016 0746   AST 27 02/11/2016 0746   ALT 23 02/11/2016 0746   BILITOT 0.6 02/11/2016 0746     Fasting glucose 131  Lab Results  Component Value Date   CHOL 124 (L) 07/19/2015   HDL 50 07/19/2015   LDLCALC 57 07/19/2015   TRIG 84 07/19/2015   CHOLHDL 2.5 07/19/2015    ASSESSMENT/PLAN:  Medicare annual wellness visit, initial  Pure hypercholesterolemia - at goal per [facility] labs.  Continue current treatment and recheck in 6 mos  Essential hypertension, benign  Morbid obesity, unspecified obesity type ([place]) - encouraged continued weight loss; reviewed need for regular exercise and discussed recommendations  Aortic stenosis - asymptomatic, stable  Type 2 diabetes mellitus without complication, without long-term current use of insulin ([place]) - controlled - Plan: metFORMIN (GLUCOPHAGE-XR) 500 MG 24 hr tablet  COPD without exacerbation ([place]) - consider add'l medication--she is looking into a f/u pharmquest study. repeat spirometry at next visit  Medication monitoring encounter  Controlled type 2 diabetes mellitus without complication, without long-term current use of insulin ([place]) - Plan: metFORMIN (GLUCOPHAGE-XR) 500 MG 24 hr tablet   Discussed monthly self breast exams and yearly mammograms; at least 30 minutes of aerobic activity at least 5 days/week and weight-bearing exercise 2x/week; proper  sunscreen use reviewed; healthy diet, including goals of calcium and vitamin D intake and alcohol recommendations (less than or equal to 1 drink/day) reviewed; regular seatbelt use; changing batteries in smoke  detectors.  Immunization recommendations discussed, UTD, continue yearly high dose flu shots.  Colonoscopy recommendations reviewed, UTD--need to get recent colonoscopy report from Dr. Collene Mares   Suspect mild seb derm of ear (she gives h/o of flaking at eyebrows too) HC cream prn.  F/u 6 months with fasting labs prior Lipids, CBC, c-met, microalb, A1c   Ok for handicap placard--asked not to abuse, only with inclines. (met criteria based on older spirometry study)   Medicare Attestation I have personally reviewed: The patient's medical and social history Their use of alcohol, tobacco or illicit drugs Their current medications and supplements The patient's functional ability including ADLs,fall risks, home safety risks, cognitive, and hearing and visual impairment Diet and physical activities Evidence for depression or mood disorders  The patient's weight, height, and BMI have been recorded in the chart.  I have made referrals, counseling, and provided education to the patient based on review of the above and I have provided the patient with a written personalized care plan for preventive services.     Anushka Hartinger A, MD   02/13/2016

## 2016-02-14 ENCOUNTER — Ambulatory Visit (INDEPENDENT_AMBULATORY_CARE_PROVIDER_SITE_OTHER): Payer: Commercial Managed Care - HMO | Admitting: Family Medicine

## 2016-02-14 ENCOUNTER — Encounter: Payer: Self-pay | Admitting: Family Medicine

## 2016-02-14 ENCOUNTER — Telehealth: Payer: Self-pay

## 2016-02-14 VITALS — BP 136/74 | HR 84 | Ht 64.0 in | Wt 228.6 lb

## 2016-02-14 DIAGNOSIS — E119 Type 2 diabetes mellitus without complications: Secondary | ICD-10-CM

## 2016-02-14 DIAGNOSIS — J449 Chronic obstructive pulmonary disease, unspecified: Secondary | ICD-10-CM | POA: Diagnosis not present

## 2016-02-14 DIAGNOSIS — Z0000 Encounter for general adult medical examination without abnormal findings: Secondary | ICD-10-CM | POA: Diagnosis not present

## 2016-02-14 DIAGNOSIS — I350 Nonrheumatic aortic (valve) stenosis: Secondary | ICD-10-CM

## 2016-02-14 DIAGNOSIS — E7800 Pure hypercholesterolemia, unspecified: Secondary | ICD-10-CM | POA: Diagnosis not present

## 2016-02-14 DIAGNOSIS — I10 Essential (primary) hypertension: Secondary | ICD-10-CM

## 2016-02-14 DIAGNOSIS — Z5181 Encounter for therapeutic drug level monitoring: Secondary | ICD-10-CM | POA: Diagnosis not present

## 2016-02-14 DIAGNOSIS — Z853 Personal history of malignant neoplasm of breast: Secondary | ICD-10-CM

## 2016-02-14 DIAGNOSIS — Z01419 Encounter for gynecological examination (general) (routine) without abnormal findings: Secondary | ICD-10-CM | POA: Diagnosis not present

## 2016-02-14 MED ORDER — METFORMIN HCL ER 500 MG PO TB24: 500.0000 mg | ORAL_TABLET | Freq: Three times a day (TID) | ORAL | 1 refills | Status: DC

## 2016-02-14 NOTE — Telephone Encounter (Signed)
Pt request labs prior to to OV on 08/25/2016 .Please enter orders. Thank you, RLB

## 2016-02-14 NOTE — Patient Instructions (Signed)
HEALTH MAINTENANCE RECOMMENDATIONS:  It is recommended that you get at least 30 minutes of aerobic exercise at least 5 days/week (for weight loss, you may need as much as 60-90 minutes). This can be any activity that gets your heart rate up. This can be divided in 10-15 minute intervals if needed, but try and build up your endurance at least once a week.  Weight bearing exercise is also recommended twice weekly.  Eat a healthy diet with lots of vegetables, fruits and fiber.  "Colorful" foods have a lot of vitamins (ie green vegetables, tomatoes, red peppers, etc).  Limit sweet tea, regular sodas and alcoholic beverages, all of which has a lot of calories and sugar.  Up to 1 alcoholic drink daily may be beneficial for women (unless trying to lose weight, watch sugars).  Drink a lot of water.  Calcium recommendations are 1200-1500 mg daily (1500 mg for postmenopausal women or women without ovaries), and vitamin D 1000 IU daily.  This should be obtained from diet and/or supplements (vitamins), and calcium should not be taken all at once, but in divided doses.  Monthly self breast exams and yearly mammograms for women over the age of 51 is recommended.  Sunscreen of at least SPF 30 should be used on all sun-exposed parts of the skin when outside between the hours of 10 am and 4 pm (not just when at beach or pool, but even with exercise, golf, tennis, and yard work!)  Use a sunscreen that says "broad spectrum" so it covers both UVA and UVB rays, and make sure to reapply every 1-2 hours.  Remember to change the batteries in your smoke detectors when changing your clock times in the spring and fall.  Use your seat belt every time you are in a car, and please drive safely and not be distracted with cell phones and texting while driving.   Ms. Hruza , Thank you for taking time to come for your Medicare Wellness Visit. I appreciate your ongoing commitment to your health goals. Please review the following  plan we discussed and let me know if I can assist you in the future.   These are the goals we discussed: Goals    None      This is a list of the screening recommended for you and due dates:  Health Maintenance  Topic Date Due  . Complete foot exam   01/10/2016  . Flu Shot  01/29/2016  . Urine Protein Check  07/18/2016  . Hemoglobin A1C  08/13/2016  . Eye exam for diabetics  08/27/2016  . Mammogram  09/26/2017  . Tetanus Vaccine  02/22/2018  . Colon Cancer Screening  08/28/2020  . Shingles Vaccine  Completed  .  Hepatitis C: One time screening is recommended by Center for Disease Control  (CDC) for  adults born from [birth year] through 1965.   Completed  . Pneumonia vaccines  Completed   We did your diabetic foot exam today. High dose flu shots are not yet available.  Return sometime before October to get it. Please get your mammogram in 08/2016 (not 2019 as stated above). Ignore the date for colonoscopy above--this is simply 10 years from your 2012 colonoscopy, the last report we have.  You will be due per Dr. Lorie Apley recommendation from your recent colonoscopy (we will be requesting this report)  PLEASE resume a regular exercise routine--at least 150 minutes of aerobic exercise each week (can be in 10-15 min intervals, if needed).  Weight bearing exercise  at least 2 times/week.

## 2016-02-22 ENCOUNTER — Other Ambulatory Visit: Payer: Self-pay | Admitting: Family Medicine

## 2016-02-22 DIAGNOSIS — J441 Chronic obstructive pulmonary disease with (acute) exacerbation: Secondary | ICD-10-CM

## 2016-02-28 ENCOUNTER — Encounter: Payer: Self-pay | Admitting: *Deleted

## 2016-03-11 ENCOUNTER — Encounter: Payer: Self-pay | Admitting: Cardiology

## 2016-03-25 ENCOUNTER — Ambulatory Visit (INDEPENDENT_AMBULATORY_CARE_PROVIDER_SITE_OTHER): Payer: Commercial Managed Care - HMO | Admitting: Cardiology

## 2016-03-25 ENCOUNTER — Encounter: Payer: Self-pay | Admitting: Cardiology

## 2016-03-25 ENCOUNTER — Other Ambulatory Visit (INDEPENDENT_AMBULATORY_CARE_PROVIDER_SITE_OTHER): Payer: Commercial Managed Care - HMO

## 2016-03-25 VITALS — BP 126/86 | HR 64 | Ht 64.5 in | Wt 234.0 lb

## 2016-03-25 DIAGNOSIS — Z23 Encounter for immunization: Secondary | ICD-10-CM

## 2016-03-25 DIAGNOSIS — I350 Nonrheumatic aortic (valve) stenosis: Secondary | ICD-10-CM | POA: Diagnosis not present

## 2016-03-25 DIAGNOSIS — E7800 Pure hypercholesterolemia, unspecified: Secondary | ICD-10-CM | POA: Diagnosis not present

## 2016-03-25 DIAGNOSIS — I10 Essential (primary) hypertension: Secondary | ICD-10-CM | POA: Diagnosis not present

## 2016-03-25 DIAGNOSIS — R0600 Dyspnea, unspecified: Secondary | ICD-10-CM | POA: Diagnosis not present

## 2016-03-25 NOTE — Progress Notes (Signed)
Cardiology Office Note   Date:  03/25/2016   ID:  Shannon Schultz, DOB Apr 19, 1946, MRN [id number]  PCP:  [name], MD  Cardiologist:   Candee Furbish, MD       History of Present Illness: Shannon Schultz is a 70 y.o. female who presents for follow up of dyspnea on exertion. Has moderate aortic stenosis. She has been diagnosed with COPD, quit smoking in 2012, obesity, diabetes, hypertension. She did not think that Symbicort helped with her symptoms of dyspnea. She will use samples. She will be short of breath when walking to the mailbox, 50 feet, going up stairs, carrying laundry.  PFTs from 10/05/14 conclusion was severe airway obstruction improved with bronchodilation. FEV1-52%.  Both parents had heart disease. Father died 89 MI, Mother died 20, tachycardia.   Over the past few months she has noted increased shortness of breath, not relieved by Symbicort over the past 6 weeks. She wonders if this could be an anginal equivalent. No specific chest pain, just SOB. No orthopnea. She will occasionally get fleeting palpitations which can take her breath away but the last 1-2 seconds. I explained that these are likely premature beats.   Past Medical History:  Diagnosis Date  . Allergy   . Cancer ([place]) 1992   R breast, DCIS  . Colon polyp   . COPD (chronic obstructive pulmonary disease) ([place])   . Diverticulosis   . Elevated cholesterol   . Elevated liver enzymes    fatty liver per ultrasound per pt  . Family history of malignant neoplasm of breast   . FHx: BRCA2 gene positive    sister with BRCA2 mutation (pt tested NEGATIVE)  . Genital HSV    gets on hip  . Heart murmur     echo 05/2011 mild aortic stenosis  . HSV (herpes simplex virus) infection    on hip--on daily suppression  . Hypertension   . Impaired glucose tolerance   . Migraine   . Osteopenia   . Pneumonia 06/06/2011  . Type 2 diabetes mellitus ([place])   . Vitamin D deficiency disease     Past Surgical History:    Procedure Laterality Date  . CHOLECYSTECTOMY  2003  . COLONOSCOPY  2009, 08/2010, 12/2015   Dr. Collene Mares  . HERNIA REPAIR  [id number]   umbilical  . MASTECTOMY Right 1992   R breast for cancer     Current Outpatient Prescriptions  Medication Sig Dispense Refill  . acetaminophen (TYLENOL) 325 MG tablet Take 650 mg by mouth every 6 (six) hours as needed for mild pain, fever or headache. Reported on 10/08/2015    . albuterol (PROVENTIL) (2.5 MG/3ML) 0.083% nebulizer solution USE 1 VIAL BY NEBULIZATION EVERY FOUR HOURS AS NEEDED FOR WHEEZING OR SHORTNESS OF BREATH. 270 mL 0  . aspirin EC 81 MG tablet Take 81 mg by mouth daily.      . bisoprolol-hydrochlorothiazide (ZIAC) 10-6.25 MG tablet TAKE 1 TABLET EVERY DAY 90 tablet 1  . Blood Glucose Monitoring Suppl (TRUE METRIX AIR GLUCOSE METER) w/Device KIT USE AS DIRECTED 1 kit 0  . Calcium Carbonate-Vitamin D (CALCIUM 600+D) 600-400 MG-UNIT per tablet Take 1 tablet by mouth daily.     . cetirizine (KLS ALLER-TEC) 10 MG tablet Take 10 mg by mouth daily.    . Cholecalciferol (VITAMIN D) 2000 UNITS tablet Take 4,000 Units by mouth daily.    . fluticasone (FLONASE) 50 MCG/ACT nasal spray Place 2 sprays into both nostrils daily. 16 g  2  . furosemide (LASIX) 20 MG tablet TAKE 1 TABLET BY MOUTH ONLY IF NEEDED FOR SWELLING 30 tablet 2  . metFORMIN (GLUCOPHAGE-XR) 500 MG 24 hr tablet Take 1 tablet (500 mg total) by mouth 3 (three) times daily. 270 tablet 1  . Multiple Vitamins-Minerals (CENTRUM SILVER PO) Take 1 tablet by mouth.      Marland Kitchen PROAIR HFA 108 (90 Base) MCG/ACT inhaler INHALE 2 PUFFS INTO THE LUNGS EVERY 6 (SIX) HOURS AS NEEDED FOR WHEEZING. 18 g 0  . simvastatin (ZOCOR) 20 MG tablet TAKE 1 TABLET AT BEDTIME 90 tablet 0  . SYMBICORT 160-4.5 MCG/ACT inhaler INHALE 2 PUFFS INTO THE LUNGS 2 (TWO) TIMES DAILY. 3 Inhaler 1  . valACYclovir (VALTREX) 500 MG tablet Take 1 tablet (500 mg total) by mouth daily. 90 tablet 3   No current facility-administered  medications for this visit.     Allergies:   Lisinopril; Contrast media [iodinated diagnostic agents]; Iohexol; Latex; Levofloxacin; Sulfa antibiotics; Codeine; and Lipitor [atorvastatin calcium]    Social History:  The patient  reports that she quit smoking about 4 years ago. Her smoking use included Cigarettes. She has a 46.00 pack-year smoking history. She has never used smokeless tobacco. She reports that she does not drink alcohol or use drugs.   Family History:  The patient's family history includes Allergies in her sister and sister; Asthma in her sister and sister; Breast cancer in her other; Breast cancer (age of onset: 63) in her sister; Cancer in her maternal grandmother and other; Diabetes in her father; Hashimoto's thyroiditis in her sister and sister; Heart disease in her father and mother; Hyperlipidemia in her sister and sister; Hypertension in her father; Stroke (age of onset: 36) in her paternal grandmother; Tuberculosis in her maternal grandfather.    ROS:  Please see the history of present illness.   Otherwise, review of systems are positive for occasional palpitations/fluttering sensation in her throat that last less than 2 seconds, sometimes catches her breath. Shortness of breath with activity, skipping heartbeats, snoring, hearing loss.   All other systems are reviewed and negative.    PHYSICAL EXAM: VS:  BP 126/86   Pulse 64   Ht 5' 4.5" (1.638 m)   Wt 234 lb (106.1 kg)   LMP  (LMP Unknown)   BMI 39.55 kg/m  , BMI Body mass index is 39.55 kg/m. GEN: Well nourished, well developed, in no acute distress  HEENT: normal  Neck: no JVD, radiation of aortic valve stenosis murmur to carotids, no masses Cardiac: RRR; 2/6 systolic right upper sternal border murmur, no rubs, or gallops, chronic appearing lower extremity edema  Respiratory:  clear to auscultation bilaterally, normal work of breathing GI: soft, nontender, nondistended, + BS overweight MS: no deformity or  atrophy  Skin: warm and dry, no rash hyperpigmented changes lower extremities Neuro:  Strength and sensation are intact Psych: euthymic mood, full affect   EKG:  EKG is not ordered today.  01/10/15 shows sinus rhythm with poor R-wave progression  Recent Labs: 08/16/2015: Magnesium 1.7 08/27/2015: Brain Natriuretic Peptide 35.1; Hemoglobin 12.8; Platelets 131 02/11/2016: ALT 23; BUN 20; Creat 0.78; Potassium 4.9; Sodium 140; TSH 2.42    Lipid Panel    Component Value Date/Time   CHOL 124 (L) 07/19/2015 0001   TRIG 84 07/19/2015 0001   HDL 50 07/19/2015 0001   CHOLHDL 2.5 07/19/2015 0001   VLDL 17 07/19/2015 0001   LDLCALC 57 07/19/2015 0001      Wt Readings  from Last 3 Encounters:  03/25/16 234 lb (106.1 kg)  02/14/16 228 lb 9.6 oz (103.7 kg)  11/19/15 236 lb (107 kg)    Echocardiogram 03/21/15:  - Left ventricle: The cavity size was normal. Wall thickness was   increased in a pattern of mild LVH. Systolic function was normal.   The estimated ejection fraction was in the range of 60% to 65%.   Wall motion was normal; there were no regional wall motion   abnormalities. Doppler parameters are consistent with abnormal   left ventricular relaxation (grade 1 diastolic dysfunction). The   E/e&' ratio is between 8-15, suggesting indeterminate LV filling   pressure. - Aortic valve: Moderately calcified leaflets. Trileaflet. Moderate   aortic stenosis. Mean gradient (S): 17 mm Hg. Peak gradient (S):   33 mm Hg. Valve area (VTI): 1.23 cm^2. - Mitral valve: Calcified annulus. Mildly thickened leaflets .   There was mild regurgitation. - Left atrium: The atrium was normal in size. - Right atrium: The atrium was mildly dilated. - Inferior vena cava: The vessel was normal in size. The   respirophasic diameter changes were in the normal range (= 50%),   consistent with normal central venous pressure.  Impressions:  - Compared to a prior echo in 2012 - there is now moderate  aortic   stenosis. The AVA is around 1.2 cm2  NUC stress 03/22/15:   Nuclear stress EF: 71%.   There was no ST segment deviation noted during stress.   The study is normal. No evidence of ischemia. No evidence of infarction .   This is a low risk study.   Other studies Reviewed: Additional studies/ records that were reviewed today include: Prior office records, PFTs, echocardiogram from 2012, Dr. Melvyn Novas consultation 2013. Review of the above records demonstrates: as above   ASSESSMENT AND PLAN:  1.  Dyspnea on exertion- Both her parents had heart disease. She is concerned about the possibility given her extensive prior smoking history. She also understands that her morbid obesity may playing a role as well. She has been diagnosed as well with COPD however her Symbicort has not really altered her symptoms. Pulmonary function tests showed an FEV1 of 52% which responded well to bronchodilation. NUC reassuring. ECHO with normal EF. She is feeling stable overall.  2. Morbid obesity-continue to encourage weight loss. This will help with constellation of symptoms. Understanding point of weight loss especially if surgery is needed in the next 2 years.  3. COPD-she is on long-term inhaler. She is on bisoprolol which should be less bronco spastic. Hospitalized 08/18/15 with Flu A. Demand ischemia noted (0.6).   4. Hyperlipidemia-continue with simvastatin. Excellent.  5. Aortic stenosis-mild in 2012 now moderate. This is the murmur we are hearing on exam with radiation to the carotids. Should not be of clinical consequence at this time. Monitor. We will check an echocardiogram.  Current medicines are reviewed at length with the patient today.  The patient does not have concerns regarding medicines.  The following changes have been made:  no change  Labs/ tests ordered today include:   Orders Placed This Encounter  Procedures  . ECHOCARDIOGRAM COMPLETE     Disposition:   I will  follow-up with stress test and echocardiogram. In the meantime, we will follow-up in one year given her aortic stenosis.   Signed, Candee Furbish, MD  03/25/2016 10:01 AM    [facility] [address], [place], [place]  [address] Phone: [phone]; Fax: [phone]

## 2016-03-25 NOTE — Patient Instructions (Signed)

## 2016-03-26 ENCOUNTER — Telehealth: Payer: Self-pay | Admitting: *Deleted

## 2016-03-26 MED ORDER — PROAIR HFA 108 (90 BASE) MCG/ACT IN AERS: INHALATION_SPRAY | RESPIRATORY_TRACT | 0 refills | Status: DC

## 2016-03-26 NOTE — Telephone Encounter (Signed)
Shannon Schultz, I sent this rx to her mail order, hoping it prompts a PA, didn't think we called before hand? Feel like this is the right procedure. So sending to you to keep an eye out, thanks.

## 2016-03-26 NOTE — Telephone Encounter (Signed)
Patient called and is asking for an rx to be sent to [facility] order for 90 day of ProAir BRAND ONLY, wanting a prior auth to be triggered on this, I'm thinking this is the way to do it? Last time we sent to [facility] per her request but she said she called [facility] Mail order and they said as long as we do a PA it will be cheaper.

## 2016-03-26 NOTE — Telephone Encounter (Signed)
Ok to send to pharmacy with DAW; might want to cc: to Mickel Baas to be on the lookout for Prior Auth

## 2016-04-02 ENCOUNTER — Telehealth: Payer: Self-pay | Admitting: Family Medicine

## 2016-04-02 NOTE — Telephone Encounter (Signed)
Called pt and left message.  We never received any P.A. Info.  Called [employer] mail order & brand name is not going thru but didn't generate a P.A. Because it is formulary exception.  Called P.A. Dept [phone] & initiated P.A.  Ref # [id number] & it will take 2-3 days for response

## 2016-04-03 NOTE — Telephone Encounter (Signed)
P.A. Approved til 06/29/16, pt informed

## 2016-04-08 ENCOUNTER — Ambulatory Visit (HOSPITAL_COMMUNITY): Payer: Commercial Managed Care - HMO | Attending: Cardiovascular Disease

## 2016-04-08 DIAGNOSIS — I350 Nonrheumatic aortic (valve) stenosis: Secondary | ICD-10-CM

## 2016-04-08 DIAGNOSIS — I517 Cardiomegaly: Secondary | ICD-10-CM | POA: Insufficient documentation

## 2016-05-05 ENCOUNTER — Other Ambulatory Visit: Payer: Self-pay | Admitting: Family Medicine

## 2016-05-05 DIAGNOSIS — B009 Herpesviral infection, unspecified: Secondary | ICD-10-CM

## 2016-05-07 ENCOUNTER — Other Ambulatory Visit: Payer: Self-pay | Admitting: Family Medicine

## 2016-05-12 ENCOUNTER — Other Ambulatory Visit: Payer: Self-pay | Admitting: Family Medicine

## 2016-05-26 ENCOUNTER — Ambulatory Visit (INDEPENDENT_AMBULATORY_CARE_PROVIDER_SITE_OTHER): Payer: Commercial Managed Care - HMO | Admitting: Family Medicine

## 2016-05-26 ENCOUNTER — Encounter: Payer: Self-pay | Admitting: Family Medicine

## 2016-05-26 VITALS — BP 126/72 | HR 72 | Temp 99.0°F | Ht 64.5 in | Wt 232.4 lb

## 2016-05-26 DIAGNOSIS — R059 Cough, unspecified: Secondary | ICD-10-CM

## 2016-05-26 DIAGNOSIS — J069 Acute upper respiratory infection, unspecified: Secondary | ICD-10-CM | POA: Diagnosis not present

## 2016-05-26 DIAGNOSIS — J449 Chronic obstructive pulmonary disease, unspecified: Secondary | ICD-10-CM

## 2016-05-26 DIAGNOSIS — R05 Cough: Secondary | ICD-10-CM | POA: Diagnosis not present

## 2016-05-26 MED ORDER — IPRATROPIUM BROMIDE 0.06 % NA SOLN: 2.0000 | Freq: Four times a day (QID) | NASAL | 0 refills | Status: DC

## 2016-05-26 MED ORDER — BENZONATATE 200 MG PO CAPS: 200.0000 mg | ORAL_CAPSULE | Freq: Three times a day (TID) | ORAL | 0 refills | Status: DC | PRN

## 2016-05-26 NOTE — Patient Instructions (Signed)
  Stop using the Tussin. Continue the Mucinex twice daily. Continue the zyrtec and Flonase. Use the new prescription nasal spray (ipratropium)as needed to help dry the runny nose. Use the benzonatate as needed for cough.  Call or return if persistent fevers, mucus turns discolored (other than first thing in the morning), worsening shortness of breath or other concerns develop.  If you have worsening ear pain (and/or persistent fevers), please call for antibiotic or follow-up for re-check.  There is no evidence of bacterial ear infection at this time.

## 2016-05-26 NOTE — Progress Notes (Signed)
Chief Complaint  Patient presents with  . Cough    started Thursday night. Mucus is clear, no fevers. No chills or body aches. Ear fullness and pain. R is worse than L.   4 days ago she started with a slight cough, then developed runny nose. She continued to get worse over the weekend, and now is complaining of ear plugging and pain R>L.    No known fevers at home.  Tylenol wasn't helping with pain (headache, ear pain).  She also took a BC powder this morning--hasn't helped much.  Complaining of bitemporal headache. Some pain at the top of her nose bilateraly, but not extending behind the eyes or into her cheeks.  Denies sore throat.  Nasal mucus is clear.  Cough is sometimes dry, sometimes productive of clear mucus, but thick.  She has used the albuterol a few times at night, not needing it during the day.  +sick grandchildren and sick husband. No known flu exposure.  She has been using 12 hour Mucinex (plain), Flonase, tylenol and BC this morning.  She has also been using a Tussin cough syrup, which hasn't helped much.  PMH, PSH, SH reviewed  Outpatient Encounter Prescriptions as of 05/26/2016  Medication Sig  . aspirin EC 81 MG tablet Take 81 mg by mouth daily.    . Aspirin-Salicylamide-Caffeine (BC HEADACHE POWDER PO) Take by mouth.  . bisoprolol-hydrochlorothiazide (ZIAC) 10-6.25 MG tablet TAKE 1 TABLET EVERY DAY  . Blood Glucose Monitoring Suppl (TRUE METRIX AIR GLUCOSE METER) w/Device KIT USE AS DIRECTED  . Calcium Carbonate-Vitamin D (CALCIUM 600+D) 600-400 MG-UNIT per tablet Take 1 tablet by mouth daily.   . cetirizine (KLS ALLER-TEC) 10 MG tablet Take 10 mg by mouth daily.  . Cholecalciferol (VITAMIN D) 2000 UNITS tablet Take 4,000 Units by mouth daily.  . fluticasone (FLONASE) 50 MCG/ACT nasal spray Place 2 sprays into both nostrils daily.  . furosemide (LASIX) 20 MG tablet TAKE 1 TABLET BY MOUTH ONLY IF NEEDED FOR SWELLING  . guaiFENesin (MUCINEX) 600 MG 12 hr tablet Take 600  mg by mouth 2 (two) times daily.  . metFORMIN (GLUCOPHAGE-XR) 500 MG 24 hr tablet Take 1 tablet (500 mg total) by mouth 3 (three) times daily.  . Multiple Vitamins-Minerals (CENTRUM SILVER PO) Take 1 tablet by mouth.    . PROAIR HFA 108 (90 Base) MCG/ACT inhaler INHALE 2 PUFFS INTO THE LUNGS EVERY 6 (SIX) HOURS AS NEEDED FOR WHEEZING.  . simvastatin (ZOCOR) 20 MG tablet TAKE 1 TABLET AT BEDTIME  . SYMBICORT 160-4.5 MCG/ACT inhaler INHALE 2 PUFFS INTO THE LUNGS 2 (TWO) TIMES DAILY.  . TRUE METRIX BLOOD GLUCOSE TEST test strip TEST ONE TIME DAILY  . TRUEPLUS LANCETS 33G MISC TEST ONE TIME DAILY  . valACYclovir (VALTREX) 500 MG tablet TAKE 1 TABLET EVERY DAY  . acetaminophen (TYLENOL) 325 MG tablet Take 650 mg by mouth every 6 (six) hours as needed for mild pain, fever or headache. Reported on 10/08/2015  . albuterol (PROVENTIL) (2.5 MG/3ML) 0.083% nebulizer solution USE 1 VIAL BY NEBULIZATION EVERY FOUR HOURS AS NEEDED FOR WHEEZING OR SHORTNESS OF BREATH. (Patient not taking: Reported on 05/26/2016)  . benzonatate (TESSALON) 200 MG capsule Take 1 capsule (200 mg total) by mouth 3 (three) times daily as needed.  . ipratropium (ATROVENT) 0.06 % nasal spray Place 2 sprays into both nostrils 4 (four) times daily.   No facility-administered encounter medications on file as of 05/26/2016.    Allergies  Allergen Reactions  . Lisinopril   Shortness Of Breath and Rash  . Contrast Media [Iodinated Diagnostic Agents] Other (See Comments)    Could not breath  . Iohexol Other (See Comments)    Immediately could not breathe  . Latex   . Levofloxacin Other (See Comments)    insomnia  . Sulfa Antibiotics Other (See Comments)    Historical from mother, pt states that mother says she almost died from this drug  . Codeine Nausea Only, Anxiety and Other (See Comments)    insomnia  . Lipitor [Atorvastatin Calcium] Rash   ROS: See HPI.  Denies nausea, vomiting, diarrhea, urinary complaints, bleeding, bruising  or rash. See HPi  PHYSICAL EXAM:  BP 126/72 (BP Location: Left Arm, Patient Position: Sitting, Cuff Size: Normal)   Pulse 72   Temp 99 F (37.2 C) (Tympanic)   Ht 5' 4.5" (1.638 m)   Wt 232 lb 6.4 oz (105.4 kg)   LMP  (LMP Unknown)   BMI 39.28 kg/m    Well developed, pleasant female in no distress. Frequent sniffling, rare cough. Speaking easily in full sentences HEENT: PERRL, EOMI, conjunctiva and sclera are clear.  TM's and EAC's--no erythema or bulging. Abnormal light reflex on the right, with bubbles. No drainage. OP is clear. Sinuses are nontender Neck: no lymphadenopathy or mass Heart: regular rate and rhythm, loud murmur at RUSB unchanged Lungs: clear--fair air movement, no wheezes, rales or ronchi Neuro: alert and oriented, cranial nerves intact, normal gait Psych: normal mood, affect, hygiene and grooming Skin: normal turgor, no rash  ASSESSMENT/PLAN:  Acute upper respiratory infection - viral; supportive measures reviewed - Plan: ipratropium (ATROVENT) 0.06 % nasal spray  Cough - Plan: benzonatate (TESSALON) 200 MG capsule  COPD without exacerbation (HCC) - continue albuterol prn and regular maintenance meds   Stop using the Tussin. Continue the Mucinex twice daily. Continue the zyrtec and Flonase. Use the new prescription nasal spray (ipratropium)as needed to help dry the runny nose. Use the benzonatate as needed for cough.  Call or return if persistent fevers, mucus turns discolored (other than first thing in the morning), worsening shortness of breath or other concerns develop.  If you have worsening ear pain (and/or persistent fevers), please call for antibiotic or follow-up for re-check.  There is no evidence of bacterial ear infection at this time.

## 2016-05-29 ENCOUNTER — Encounter: Payer: Self-pay | Admitting: Family Medicine

## 2016-05-29 ENCOUNTER — Ambulatory Visit (INDEPENDENT_AMBULATORY_CARE_PROVIDER_SITE_OTHER): Payer: Commercial Managed Care - HMO | Admitting: Family Medicine

## 2016-05-29 VITALS — BP 120/70 | HR 84 | Temp 98.7°F | Ht 64.5 in | Wt 227.8 lb

## 2016-05-29 DIAGNOSIS — J069 Acute upper respiratory infection, unspecified: Secondary | ICD-10-CM

## 2016-05-29 DIAGNOSIS — J441 Chronic obstructive pulmonary disease with (acute) exacerbation: Secondary | ICD-10-CM | POA: Diagnosis not present

## 2016-05-29 DIAGNOSIS — J0100 Acute maxillary sinusitis, unspecified: Secondary | ICD-10-CM | POA: Diagnosis not present

## 2016-05-29 MED ORDER — AZITHROMYCIN 250 MG PO TABS: ORAL_TABLET | ORAL | 0 refills | Status: DC

## 2016-05-29 MED ORDER — PREDNISONE 10 MG PO TABS: ORAL_TABLET | ORAL | 0 refills | Status: DC

## 2016-05-29 NOTE — Patient Instructions (Signed)
  Continue to drink plenty of fluids, mucinex DM (or tussin DM) and benzonatate as needed. Consider using neti-pot or sinus rinse kits once or twice daily to help ease the sinus pain in your cheeks.  Take the antibiotic as directed (2 today, then once daily for 4 more days).  It works for a full 10 days even though you only take it for 5.  Call us after 10 days if you aren't completely better. Call us next week if you're worse. Take 6 prednisone today, then 5 tomorrow, 4 the next day, 3 the following day, then 2, then 1 pill a day for 2 days (60/50/40/30/20/10/10 mg each day).

## 2016-05-29 NOTE — Progress Notes (Signed)
Chief Complaint  Patient presents with  . Cough    feels like she is not getting any better. Mucus is still clear, she has been continuing to run low grade temps. Ears are still full and she cannot taste anything. Used nasal spray for one day and nose began bleed so she stopped-blots clots.    She was seen 3 days ago with runny nose, ear plugging and pain R>L, and cough (for 4d at time of visit, now a week). She was prescribed atrovent nasal spray to help with the runny nose, and prescribed benzonatate for cough.  She used atrovent for a total of 4 doses, and after having some nose bleeding, with a large clot, she stopped using it. Tessalon is somewhat helpful for the cough.  She now feels like she is wheezing, having some shortness of breath. She has a pulse oximeter at home and found her saturation to be 85% initially--she was able to get it up to 94% with a lot of deep breath (didn't use albuterol at that time).  She used nebulizer machine twice yesterday, not yet today.  It seems to help some. She has some facial pain, R cheek more than the left, as also has bitemporal headache. Mucus is reportedly still clear from her nose. She hasn't looked at her phlegm. Ears continue to feel plugged, "tight", right one is slightly painful, intermittent.  She continues to have a low grade fever (99). Takes tylenol--last dose was about 4-5 hours ago.  PMH, PSH, SH reviewed  Outpatient Encounter Prescriptions as of 05/29/2016  Medication Sig  . acetaminophen (TYLENOL) 325 MG tablet Take 650 mg by mouth every 6 (six) hours as needed for mild pain, fever or headache. Reported on 10/08/2015  . aspirin EC 81 MG tablet Take 81 mg by mouth daily.    . benzonatate (TESSALON) 200 MG capsule Take 1 capsule (200 mg total) by mouth 3 (three) times daily as needed.  . bisoprolol-hydrochlorothiazide (ZIAC) 10-6.25 MG tablet TAKE 1 TABLET EVERY DAY  . Calcium Carbonate-Vitamin D (CALCIUM 600+D) 600-400 MG-UNIT per tablet  Take 1 tablet by mouth daily.   . cetirizine (KLS ALLER-TEC) 10 MG tablet Take 10 mg by mouth daily.  . Cholecalciferol (VITAMIN D) 2000 UNITS tablet Take 4,000 Units by mouth daily.  . fluticasone (FLONASE) 50 MCG/ACT nasal spray Place 2 sprays into both nostrils daily.  . furosemide (LASIX) 20 MG tablet TAKE 1 TABLET BY MOUTH ONLY IF NEEDED FOR SWELLING  . metFORMIN (GLUCOPHAGE-XR) 500 MG 24 hr tablet Take 1 tablet (500 mg total) by mouth 3 (three) times daily.  . Multiple Vitamins-Minerals (CENTRUM SILVER PO) Take 1 tablet by mouth.    . simvastatin (ZOCOR) 20 MG tablet TAKE 1 TABLET AT BEDTIME  . SYMBICORT 160-4.5 MCG/ACT inhaler INHALE 2 PUFFS INTO THE LUNGS 2 (TWO) TIMES DAILY.  Marland Kitchen TRUEPLUS LANCETS 33G MISC TEST ONE TIME DAILY  . valACYclovir (VALTREX) 500 MG tablet TAKE 1 TABLET EVERY DAY  . [DISCONTINUED] TRUE METRIX BLOOD GLUCOSE TEST test strip TEST ONE TIME DAILY  . albuterol (PROVENTIL) (2.5 MG/3ML) 0.083% nebulizer solution USE 1 VIAL BY NEBULIZATION EVERY FOUR HOURS AS NEEDED FOR WHEEZING OR SHORTNESS OF BREATH. (Patient not taking: Reported on 05/29/2016)  . guaiFENesin (MUCINEX) 600 MG 12 hr tablet Take 600 mg by mouth 2 (two) times daily.  Marland Kitchen ipratropium (ATROVENT) 0.06 % nasal spray Place 2 sprays into both nostrils 4 (four) times daily. (Patient not taking: Reported on 05/29/2016)  . PROAIR  HFA 108 (90 Base) MCG/ACT inhaler INHALE 2 PUFFS INTO THE LUNGS EVERY 6 (SIX) HOURS AS NEEDED FOR WHEEZING. (Patient not taking: Reported on 05/29/2016)  . [DISCONTINUED] Aspirin-Salicylamide-Caffeine (BC HEADACHE POWDER PO) Take by mouth.  . [DISCONTINUED] Blood Glucose Monitoring Suppl (TRUE METRIX AIR GLUCOSE METER) w/Device KIT USE AS DIRECTED   No facility-administered encounter medications on file as of 05/29/2016.    Allergies  Allergen Reactions  . Lisinopril Shortness Of Breath and Rash  . Contrast Media [Iodinated Diagnostic Agents] Other (See Comments)    Could not breath  .  Iohexol Other (See Comments)    Immediately could not breathe  . Latex   . Levofloxacin Other (See Comments)    insomnia  . Sulfa Antibiotics Other (See Comments)    Historical from mother, pt states that mother says she almost died from this drug  . Codeine Nausea Only, Anxiety and Other (See Comments)    insomnia  . Lipitor [Atorvastatin Calcium] Rash    ROS: +low grade fevers, headache, URI symptoms as per HPI. Some shortness of breath.  Denies chest pain, edema, nausea, vomiting, diarrhea, rash, myalgias.  PHYSICAL EXAM:  BP 120/70 (BP Location: Left Arm, Patient Position: Sitting, Cuff Size: Normal)   Pulse 84   Temp 98.7 F (37.1 C) (Tympanic)   Ht 5' 4.5" (1.638 m)   Wt 227 lb 12.8 oz (103.3 kg)   LMP  (LMP Unknown)   SpO2 93%   BMI 38.50 kg/m   Well developed female in no distress. Speaking easily in full sentences without distress. HEENT: PERRL, EOMI, conjunctiva and sclera are clear. TM's same as earlier in the week--R is slightly retracted, ?slight bubble. No erythema, no drainage.  L appears completely normal Tender at temporalis muscles bilaterally Tender at R maxillary sinus. Nasal mucosa is mild-mod edematous, clear-white drainage noted on the left, none present on the right. Mild erythema. OP is clear Neck; no lymphadenopathy or mass Heart: regular rate and rhythm, murmur at RUSB, unchanged Lungs: diffuse wheezes bilaterally.  No rales or ronchi.  Fair air movement Extremities: no edema Skin: normal turgor. Dependent discoloration of feet (chronic, unchanged).  Neuro: alert and oriented, cranial nerves intact, normal strength, gait Psych: normal mood, affect, hygiene and grooming  ASSESSMENT/PLAN:  COPD with acute exacerbation (HCC) - risks/side effects of prednisone reviewed; expect sugars to rise - Plan: azithromycin (ZITHROMAX) 250 MG tablet, predniSONE (DELTASONE) 10 MG tablet  Acute non-recurrent maxillary sinusitis - reviewed supportive measures.  Treat with z-pak.  Acute upper respiratory infection - Plan: azithromycin (ZITHROMAX) 250 MG tablet, predniSONE (DELTASONE) 10 MG tablet  Offered nebulizer treatment in office today--pt declined (prefers to do herself at home).  Prednisone 60/50/40/30/20/10/10 z-pak   Continue to drink plenty of fluids, mucinex DM (or tussin DM) and benzonatate as needed. Consider using neti-pot or sinus rinse kits once or twice daily to help ease the sinus pain in your cheeks.  Take the antibiotic as directed (2 today, then once daily for 4 more days).  It works for a full 10 days even though you only take it for 5.  Call us after 10 days if you aren't completely better. Call us next week if you're worse. Take 6 prednisone today, then 5 tomorrow, 4 the next day, 3 the following day, then 2, then 1 pill a day for 2 days (60/50/40/30/20/10/10 mg each day).  

## 2016-06-02 ENCOUNTER — Ambulatory Visit (INDEPENDENT_AMBULATORY_CARE_PROVIDER_SITE_OTHER): Payer: Commercial Managed Care - HMO | Admitting: Family Medicine

## 2016-06-02 ENCOUNTER — Encounter: Payer: Self-pay | Admitting: Family Medicine

## 2016-06-02 VITALS — BP 140/70 | HR 80 | Temp 100.0°F | Ht 64.5 in | Wt 231.6 lb

## 2016-06-02 DIAGNOSIS — J441 Chronic obstructive pulmonary disease with (acute) exacerbation: Secondary | ICD-10-CM

## 2016-06-02 DIAGNOSIS — H60502 Unspecified acute noninfective otitis externa, left ear: Secondary | ICD-10-CM | POA: Diagnosis not present

## 2016-06-02 DIAGNOSIS — H6692 Otitis media, unspecified, left ear: Secondary | ICD-10-CM | POA: Diagnosis not present

## 2016-06-02 MED ORDER — AMOXICILLIN-POT CLAVULANATE 875-125 MG PO TABS: 1.0000 | ORAL_TABLET | Freq: Two times a day (BID) | ORAL | 0 refills | Status: DC

## 2016-06-02 MED ORDER — NEOMYCIN-POLYMYXIN-HC 3.5-10000-1 OT SOLN: 4.0000 [drp] | Freq: Four times a day (QID) | OTIC | 0 refills | Status: DC

## 2016-06-02 NOTE — Patient Instructions (Signed)
Stop the z-pak. Start taking augmentin twice daily instead. Use 4 drops into the left ear every 6 hours for up to 7-10 days. Complete the prednisone course as directed. You may continue to use tylenol as needed for pain or fever.

## 2016-06-02 NOTE — Progress Notes (Signed)
Chief Complaint  Patient presents with  . Ear Pain    started at 2:30 this morning. Been up since then with this and is very tearful. Left ear. Other symptoms are getting better but not 100%.    Awoke at 2:30 this morning with left ear pain.  She took tylenol around 4am, which helped.  Also using warm compresses which helped some.  The pain is less now, but still present some, constant.  She was seen last Thursday with worsening cough, shortness of breath, and ongoing URI symptoms.  She was put on antibiotics (zpak) and prednisone.  Her congestion is better. Still has some runny nose and some right cheek pain, but better than last week.  Nasal mucus is clear.  Cough is productive of clear phlegm.  95% O2 sats, breathing feels better. She feels a little jittery from her medication.  She hasn't been aware of any fevers (other than last Thursday), checking periodically at home.    PMH, PSH, SH reviewed  Outpatient Encounter Prescriptions as of 06/02/2016  Medication Sig  . aspirin EC 81 MG tablet Take 81 mg by mouth daily.    Marland Kitchen azithromycin (ZITHROMAX) 250 MG tablet Take 2 tablets by mouth on first day, then 1 tablet by mouth on days 2 through 5  . benzonatate (TESSALON) 200 MG capsule Take 1 capsule (200 mg total) by mouth 3 (three) times daily as needed.  . bisoprolol-hydrochlorothiazide (ZIAC) 10-6.25 MG tablet TAKE 1 TABLET EVERY DAY  . Calcium Carbonate-Vitamin D (CALCIUM 600+D) 600-400 MG-UNIT per tablet Take 1 tablet by mouth daily.   . cetirizine (KLS ALLER-TEC) 10 MG tablet Take 10 mg by mouth daily.  . Cholecalciferol (VITAMIN D) 2000 UNITS tablet Take 4,000 Units by mouth daily.  . fluticasone (FLONASE) 50 MCG/ACT nasal spray Place 2 sprays into both nostrils daily.  . furosemide (LASIX) 20 MG tablet TAKE 1 TABLET BY MOUTH ONLY IF NEEDED FOR SWELLING  . guaiFENesin (MUCINEX) 600 MG 12 hr tablet Take 600 mg by mouth 2 (two) times daily.  . metFORMIN (GLUCOPHAGE-XR) 500 MG 24 hr tablet  Take 1 tablet (500 mg total) by mouth 3 (three) times daily.  . Multiple Vitamins-Minerals (CENTRUM SILVER PO) Take 1 tablet by mouth.    . predniSONE (DELTASONE) 10 MG tablet Take as directed, once daily (60/50/40/30/20/10/10)  . simvastatin (ZOCOR) 20 MG tablet TAKE 1 TABLET AT BEDTIME  . SYMBICORT 160-4.5 MCG/ACT inhaler INHALE 2 PUFFS INTO THE LUNGS 2 (TWO) TIMES DAILY.  Marland Kitchen TRUEPLUS LANCETS 33G MISC TEST ONE TIME DAILY  . valACYclovir (VALTREX) 500 MG tablet TAKE 1 TABLET EVERY DAY  . acetaminophen (TYLENOL) 325 MG tablet Take 650 mg by mouth every 6 (six) hours as needed for mild pain, fever or headache. Reported on 10/08/2015  . albuterol (PROVENTIL) (2.5 MG/3ML) 0.083% nebulizer solution USE 1 VIAL BY NEBULIZATION EVERY FOUR HOURS AS NEEDED FOR WHEEZING OR SHORTNESS OF BREATH. (Patient not taking: Reported on 06/02/2016)  . PROAIR HFA 108 (90 Base) MCG/ACT inhaler INHALE 2 PUFFS INTO THE LUNGS EVERY 6 (SIX) HOURS AS NEEDED FOR WHEEZING. (Patient not taking: Reported on 06/02/2016)  . [DISCONTINUED] ipratropium (ATROVENT) 0.06 % nasal spray Place 2 sprays into both nostrils 4 (four) times daily. (Patient not taking: Reported on 05/29/2016)   No facility-administered encounter medications on file as of 06/02/2016.     She has 2 pills left of the z-pak.  She took 20mg  of prednisone this morning.  Allergies  Allergen Reactions  .  Lisinopril Shortness Of Breath and Rash  . Contrast Media [Iodinated Diagnostic Agents] Other (See Comments)    Could not breath  . Iohexol Other (See Comments)    Immediately could not breathe  . Latex   . Levofloxacin Other (See Comments)    insomnia  . Sulfa Antibiotics Other (See Comments)    Historical from mother, pt states that mother says she almost died from this drug  . Codeine Nausea Only, Anxiety and Other (See Comments)    insomnia  . Lipitor [Atorvastatin Calcium] Rash   ROS: no nausea, vomiting, diarrhea.  See HPI.  No chest pain, bleeding,  rashes. Denies shortness of breath. +URI symptoms, ear pain.  PHYSICAL EXAM:  BP 140/70 (BP Location: Left Arm, Patient Position: Sitting, Cuff Size: Normal)   Pulse 80   Temp 100 F (37.8 C) (Tympanic)   Ht 5' 4.5" (1.638 m)   Wt 231 lb 9.6 oz (105.1 kg)   LMP  (LMP Unknown)   BMI 39.14 kg/m   Well appearing, pleasant female in no distress HEENT: PERRL, EOMI, conjunctiva and sclera are clear. Right TM appears normal, same as last week. Left EAC is very red, slightly swollen.  TM is bulging, with yellow fluid and erythematous. She has some tenderness to palpation in front of ear (tragus). OP is clear, no erythema or lesions, normal mucus membranes. Sinuses nontender Neck: no lymphadenopathy or mass Heart: regular rate and rhythm, +murmur, unchanged Lungs: clear bilaterally, no wheezing, fair air movement. Skin: normal turgor, no rash Neuro: alert and oriented, cranial nerves intact, normal gait, strength Psych: normal mood, affect, hygiene and grooming  ASSESSMENT/PLAN:   Acute otitis media, left - stop z-pak and change to augmentin - Plan: amoxicillin-clavulanate (AUGMENTIN) 875-125 MG tablet  Acute otitis externa of left ear, unspecified type - Plan: neomycin-polymyxin-hydrocortisone (CORTISPORIN) otic solution  COPD with acute exacerbation (HCC) - improved; complete prednisone course    Stop the z-pak. Start taking augmentin twice daily instead. Use 4 drops into the left ear every 6 hours for up to 7-10 days. Complete the prednisone course as directed. You may continue to use tylenol as needed for pain or fever.

## 2016-06-18 ENCOUNTER — Telehealth: Payer: Self-pay | Admitting: Family Medicine

## 2016-06-18 DIAGNOSIS — H6692 Otitis media, unspecified, left ear: Secondary | ICD-10-CM

## 2016-06-18 MED ORDER — AMOXICILLIN-POT CLAVULANATE 875-125 MG PO TABS: 1.0000 | ORAL_TABLET | Freq: Two times a day (BID) | ORAL | 0 refills | Status: DC

## 2016-06-18 NOTE — Telephone Encounter (Signed)
Okay to give additional 1 week (#14) of augmentin.  Needs OV for re-eval if not better.

## 2016-06-18 NOTE — Telephone Encounter (Signed)
PT says she is only slightly better. Still very congested. Ear still giving her issues. Can she get more meds?

## 2016-06-18 NOTE — Telephone Encounter (Signed)
Patient advised and rx sent. 

## 2016-06-30 DIAGNOSIS — C679 Malignant neoplasm of bladder, unspecified: Secondary | ICD-10-CM

## 2016-06-30 HISTORY — PX: BREAST EXCISIONAL BIOPSY: SUR124

## 2016-06-30 HISTORY — DX: Malignant neoplasm of bladder, unspecified: C67.9

## 2016-06-30 HISTORY — PX: BREAST BIOPSY: SHX20

## 2016-07-02 ENCOUNTER — Other Ambulatory Visit: Payer: Self-pay | Admitting: *Deleted

## 2016-07-02 ENCOUNTER — Telehealth: Payer: Self-pay | Admitting: *Deleted

## 2016-07-02 DIAGNOSIS — J441 Chronic obstructive pulmonary disease with (acute) exacerbation: Secondary | ICD-10-CM

## 2016-07-02 DIAGNOSIS — J42 Unspecified chronic bronchitis: Secondary | ICD-10-CM

## 2016-07-02 DIAGNOSIS — R0602 Shortness of breath: Secondary | ICD-10-CM

## 2016-07-02 NOTE — Telephone Encounter (Signed)
Okay for referral?

## 2016-07-02 NOTE — Telephone Encounter (Signed)
Patient called and left a VM yesterday requesting a referral to do some pulm rehab through [name].

## 2016-07-03 ENCOUNTER — Encounter: Payer: Self-pay | Admitting: Family Medicine

## 2016-07-08 ENCOUNTER — Other Ambulatory Visit: Payer: Self-pay | Admitting: Family Medicine

## 2016-07-12 ENCOUNTER — Other Ambulatory Visit: Payer: Self-pay | Admitting: Family Medicine

## 2016-07-12 DIAGNOSIS — E119 Type 2 diabetes mellitus without complications: Secondary | ICD-10-CM

## 2016-07-21 ENCOUNTER — Encounter (HOSPITAL_COMMUNITY)
Admission: RE | Admit: 2016-07-21 | Discharge: 2016-07-21 | Disposition: A | Discharge status: Home / Self Care | Payer: Commercial Managed Care - HMO | Source: Ambulatory Visit | Attending: Family Medicine | Admitting: Family Medicine

## 2016-07-21 ENCOUNTER — Encounter (HOSPITAL_COMMUNITY): Payer: Self-pay

## 2016-07-21 VITALS — BP 136/80 | HR 79 | Ht 64.5 in | Wt 241.6 lb

## 2016-07-21 DIAGNOSIS — R0602 Shortness of breath: Secondary | ICD-10-CM | POA: Diagnosis not present

## 2016-07-21 NOTE — Progress Notes (Signed)
Shannon Schultz 71 y.o. female Pulmonary Rehab Orientation Note Patient arrived today in Cardiac and Pul[facility] Rehab for orientation to Pulmonary Rehab. She was transported from [employer] via wheel chair. She does not carry portable oxygen. Per pt, she uses oxygen never. Color good, skin warm and dry. Patient is oriented to time and place. Patient's medical history, psychosocial health, and medications reviewed. Psychosocial assessment reveals pt lives with their spouse. Pt is currently retired as an Optometrist. Pt hobbies include reading and being involved with her multi[le grandchildren.  Pt reports her stress level is low. Areas of stress/anxiety include Health.  Pt does not exhibit  signs of depression.  PHQ2/9 score 0/0. Pt shows good  coping skills with positive outlook .  Physical assessment reveals heart rate is normal, breath sounds clear to auscultation, no wheezes, rales, or rhonchi. Grip strength equal, strong. Patient reports she does take medications as prescribed. Patient states she has followed a weight watcher's diet, but has not been adhering to it lately.  She plans on getting back on track while enrolling in pulmonary rehab.   . Patient's weight will be monitored closely. Demonstration and practice of PLB using pulse oximeter. Patient able to return demonstration satisfactorily. Safety and hand hygiene in the exercise area reviewed with patient. Patient voices understanding of the information reviewed. Department expectations discussed with patient and achievable goals were set. The patient shows enthusiasm about attending the program and we look forward to working with this nice lady. The patient is scheduled for a 6 min walk test on Tuesday, July 22, 2016 @ 3:45 pm and to begin exercising on Tuesday, July 29, 2016 in the 1030 class.   [id number]

## 2016-07-22 ENCOUNTER — Encounter (HOSPITAL_COMMUNITY)
Admission: RE | Admit: 2016-07-22 | Discharge: 2016-07-22 | Disposition: A | Discharge status: Home / Self Care | Payer: Commercial Managed Care - HMO | Source: Ambulatory Visit | Attending: Family Medicine | Admitting: Family Medicine

## 2016-07-22 DIAGNOSIS — R0602 Shortness of breath: Secondary | ICD-10-CM

## 2016-07-22 NOTE — Progress Notes (Signed)
Pulmonary Individual Treatment Plan  Patient Details  Name: Shannon Schultz MRN: [id number] Date of Birth: Jan 07, 1946 Referring Provider:   April Manson Pulmonary Rehab Walk Test from 07/22/2016 in [place]  Referring Provider  Dr. Tomi Bamberger      Initial Encounter Date:  Flowsheet Row Pulmonary Rehab Walk Test from 07/22/2016 in [place]  Date  07/22/16  Referring Provider  Dr. Tomi Bamberger      Visit Diagnosis: Shortness of breath  Patient's Home Medications on Admission:   Current Outpatient Prescriptions:  .  acetaminophen (TYLENOL) 325 MG tablet, Take 650 mg by mouth every 6 (six) hours as needed for mild pain, fever or headache. Reported on 10/08/2015, Disp: , Rfl:  .  albuterol (PROVENTIL) (2.5 MG/3ML) 0.083% nebulizer solution, USE 1 VIAL BY NEBULIZATION EVERY FOUR HOURS AS NEEDED FOR WHEEZING OR SHORTNESS OF BREATH., Disp: 270 mL, Rfl: 0 .  amoxicillin-clavulanate (AUGMENTIN) 875-125 MG tablet, Take 1 tablet by mouth 2 (two) times daily. (Patient not taking: Reported on 07/21/2016), Disp: 14 tablet, Rfl: 0 .  aspirin EC 81 MG tablet, Take 81 mg by mouth daily.  , Disp: , Rfl:  .  benzonatate (TESSALON) 200 MG capsule, Take 1 capsule (200 mg total) by mouth 3 (three) times daily as needed. (Patient not taking: Reported on 07/21/2016), Disp: 30 capsule, Rfl: 0 .  bisoprolol-hydrochlorothiazide (ZIAC) 10-6.25 MG tablet, TAKE 1 TABLET EVERY DAY, Disp: 90 tablet, Rfl: 1 .  Calcium Carbonate-Vitamin D (CALCIUM 600+D) 600-400 MG-UNIT per tablet, Take 1 tablet by mouth daily. , Disp: , Rfl:  .  cetirizine (KLS ALLER-TEC) 10 MG tablet, Take 10 mg by mouth daily., Disp: , Rfl:  .  Cholecalciferol (VITAMIN D) 2000 UNITS tablet, Take 4,000 Units by mouth daily., Disp: , Rfl:  .  fluticasone (FLONASE) 50 MCG/ACT nasal spray, Place 2 sprays into both nostrils daily. (Patient not taking: Reported on 07/21/2016), Disp: 16 g, Rfl: 2 .   furosemide (LASIX) 20 MG tablet, TAKE 1 TABLET BY MOUTH ONLY IF NEEDED FOR SWELLING (Patient not taking: Reported on 07/21/2016), Disp: 30 tablet, Rfl: 2 .  guaiFENesin (MUCINEX) 600 MG 12 hr tablet, Take 600 mg by mouth 2 (two) times daily., Disp: , Rfl:  .  metFORMIN (GLUCOPHAGE-XR) 500 MG 24 hr tablet, TAKE 1 TABLET (500 MG TOTAL) BY MOUTH 3 (THREE) TIMES DAILY., Disp: 270 tablet, Rfl: 0 .  Multiple Vitamins-Minerals (CENTRUM SILVER PO), Take 1 tablet by mouth.  , Disp: , Rfl:  .  neomycin-polymyxin-hydrocortisone (CORTISPORIN) otic solution, Place 4 drops into the left ear 4 (four) times daily. Use for 7-10 days (Patient not taking: Reported on 07/21/2016), Disp: 10 mL, Rfl: 0 .  predniSONE (DELTASONE) 10 MG tablet, Take as directed, once daily (60/50/40/30/20/10/10) (Patient not taking: Reported on 07/21/2016), Disp: 22 tablet, Rfl: 0 .  PROAIR HFA 108 (90 Base) MCG/ACT inhaler, INHALE 2 PUFFS INTO THE LUNGS EVERY 6 (SIX) HOURS AS NEEDED FOR WHEEZING., Disp: 54 g, Rfl: 0 .  simvastatin (ZOCOR) 20 MG tablet, TAKE 1 TABLET AT BEDTIME, Disp: 90 tablet, Rfl: 0 .  SYMBICORT 160-4.5 MCG/ACT inhaler, INHALE 2 PUFFS INTO THE LUNGS 2 (TWO) TIMES DAILY., Disp: 3 Inhaler, Rfl: 1 .  TRUEPLUS LANCETS 33G MISC, TEST ONE TIME DAILY, Disp: 100 each, Rfl: 3 .  valACYclovir (VALTREX) 500 MG tablet, TAKE 1 TABLET EVERY DAY, Disp: 90 tablet, Rfl: 3  Past Medical History: Past Medical History:  Diagnosis Date  .  Allergy   . Cancer ([place]) 1992   R breast, DCIS  . Colon polyp   . COPD (chronic obstructive pulmonary disease) ([place])   . Diverticulosis   . Elevated cholesterol   . Elevated liver enzymes    fatty liver per ultrasound per pt  . Family history of malignant neoplasm of breast   . FHx: BRCA2 gene positive    sister with BRCA2 mutation (pt tested NEGATIVE)  . Genital HSV    gets on hip  . Heart murmur     echo 05/2011 mild aortic stenosis  . HSV (herpes simplex virus) infection    on hip--on daily  suppression  . Hypertension   . Impaired glucose tolerance   . Migraine   . Osteopenia   . Pneumonia 06/06/2011  . Type 2 diabetes mellitus ([place])   . Vitamin D deficiency disease     Tobacco Use: History  Smoking Status  . Former Smoker  . Packs/day: 1.00  . Years: 46.00  . Types: Cigarettes  . Quit date: 05/31/2011  Smokeless Tobacco  . Never Used    Labs: Recent Review Flowsheet Data    Labs for ITP Cardiac and Pulmonary Rehab Latest Ref Rng & Units 10/05/2014 01/04/2015 07/19/2015 11/08/2015 02/11/2016   Cholestrol 125 - 200 mg/dL - 110 124(L) - -   LDLCALC <130 mg/dL - 47 57 - -   HDL >=46 mg/dL - 44(L) 50 - -   Trlycerides <150 mg/dL - 96 84 - -   Hemoglobin A1c <5.7 % 6.9 6.7(H) 7.2(H) 6.3 5.6   PHART 7.350 - 7.400 - - - - -   PCO2ART 35.0 - 45.0 mmHg - - - - -   HCO3 20.0 - 24.0 mEq/L - - - - -   TCO2 0 - 100 mmol/L - - - - -   O2SAT % - - - - -      Capillary Blood Glucose: Lab Results  Component Value Date   GLUCAP 304 (H) 08/18/2015   GLUCAP 222 (H) 08/18/2015   GLUCAP 237 (H) 08/17/2015   GLUCAP 167 (H) 08/17/2015   GLUCAP 258 (H) 08/17/2015     ADL UCSD:   Pulmonary Function Assessment:     Pulmonary Function Assessment - 07/21/16 1034      Breath   Bilateral Breath Sounds Clear   Shortness of Breath Yes;Limiting activity      Exercise Target Goals: Date: 07/22/16  Exercise Program Goal: Individual exercise prescription set with THRR, safety & activity barriers. Participant demonstrates ability to understand and report RPE using BORG scale, to self-measure pulse accurately, and to acknowledge the importance of the exercise prescription.  Exercise Prescription Goal: Starting with aerobic activity 30 plus minutes a day, 3 days per week for initial exercise prescription. Provide home exercise prescription and guidelines that participant acknowledges understanding prior to discharge.  Activity Barriers & Risk Stratification:     Activity  Barriers & Cardiac Risk Stratification - 07/21/16 1032      Activity Barriers & Cardiac Risk Stratification   Activity Barriers Arthritis  in R kneeand hands      6 Minute Walk:     6 Minute Walk    Row Name 07/22/16 1623         6 Minute Walk   Phase Initial     Distance 810 feet     Walk Time 6 minutes     # of Rest Breaks 1  1 minute 11 seconds  MPH 1.53     METS 2.15     RPE 13     Perceived Dyspnea  3     Symptoms No     Resting HR 92 bpm     Resting BP 128/70     Max Ex. HR 120 bpm     Max Ex. BP 142/80       Interval HR   Baseline HR 92     1 Minute HR 102     2 Minute HR 117     3 Minute HR 120     4 Minute HR 119     5 Minute HR 117     6 Minute HR 109     2 Minute Post HR 103     Interval Heart Rate? Yes       Interval Oxygen   Interval Oxygen? Yes     Baseline Oxygen Saturation % 92 %     Baseline Liters of Oxygen 0 L     1 Minute Oxygen Saturation % 89 %     1 Minute Liters of Oxygen 0 L     2 Minute Oxygen Saturation % 84 %     2 Minute Liters of Oxygen 0 L     3 Minute Oxygen Saturation % 81 %     3 Minute Liters of Oxygen 0 L     4 Minute Oxygen Saturation % 89 %     4 Minute Liters of Oxygen 2 L     5 Minute Oxygen Saturation % 88 %     5 Minute Liters of Oxygen 2 L     6 Minute Oxygen Saturation % 86 %     6 Minute Liters of Oxygen 2 L     2 Minute Post Oxygen Saturation % 92 %     2 Minute Post Liters of Oxygen 2 L        Initial Exercise Prescription:     Initial Exercise Prescription - 07/22/16 1600      Date of Initial Exercise RX and Referring Provider   Date 07/22/16   Referring Provider Dr. Tomi Bamberger     Oxygen   Oxygen Continuous   Liters 2     Recumbant Bike   Level 2   Minutes 17   METs 1.5     NuStep   Level 2   Minutes 17   METs 1.5     Track   Laps 5   Minutes 17     Prescription Details   Frequency (times per week) 2   Duration Progress to 45 minutes of aerobic exercise without signs/symptoms of  physical distress     Intensity   THRR 40-80% of Max Heartrate 60-120   Ratings of Perceived Exertion 11-13   Perceived Dyspnea 0-4     Progression   Progression Continue progressive overload as per policy without signs/symptoms or physical distress.     Resistance Training   Training Prescription Yes   Weight ORANGE BANDS   Reps 10-12      Perform Capillary Blood Glucose checks as needed.  Exercise Prescription Changes:   Exercise Comments:   Discharge Exercise Prescription (Final Exercise Prescription Changes):    Nutrition:  Target Goals: Understanding of nutrition guidelines, daily intake of sodium <1572m, cholesterol <2022m calories 30% from fat and 7% or less from saturated fats, daily to have 5 or more servings of fruits and vegetables.  Biometrics:     Pre  Biometrics - 07/21/16 1046      Pre Biometrics   Grip Strength 35 kg       Nutrition Therapy Plan and Nutrition Goals:   Nutrition Discharge: Rate Your Plate Scores:   Psychosocial: Target Goals: Acknowledge presence or absence of depression, maximize coping skills, provide positive support system. Participant is able to verbalize types and ability to use techniques and skills needed for reducing stress and depression.  Initial Review & Psychosocial Screening:     Initial Psych Review & Screening - 07/21/16 1043      Family Dynamics   Good Support System? Yes     Barriers   Psychosocial barriers to participate in program There are no identifiable barriers or psychosocial needs.     Screening Interventions   Interventions Encouraged to exercise      Quality of Life Scores:   PHQ-9: Recent Review Flowsheet Data    Depression screen [facility] 2/9 07/21/2016 02/14/2016 01/10/2015 07/06/2013   Decreased Interest 0 0 0 0   Down, Depressed, Hopeless 0 0 0 0   PHQ - 2 Score 0 0 0 0      Psychosocial Evaluation and Intervention:     Psychosocial Evaluation - 07/21/16 1045      Psychosocial  Evaluation & Interventions   Interventions Encouraged to exercise with the program and follow exercise prescription   Comments No psychosocial issues identified   Continued Psychosocial Services Needed No     Discharge Psychosocial Assessment & Intervention   Discharge Continue support measures as needed      Psychosocial Re-Evaluation:  Education: Education Goals: Education classes will be provided on a weekly basis, covering required topics. Participant will state understanding/return demonstration of topics presented.  Learning Barriers/Preferences:     Learning Barriers/Preferences - 07/21/16 1034      Learning Barriers/Preferences   Learning Barriers None   Learning Preferences Written Material;Video;Verbal Instruction;Skilled Demonstration;Pictoral;Individual Instruction;Group Instruction;Computer/Internet;Audio      Education Topics: Risk Factor Reduction:  -Group instruction that is supported by a PowerPoint presentation. Instructor discusses the definition of a risk factor, different risk factors for pulmonary disease, and how the heart and lungs work together.     Nutrition for Pulmonary Patient:  -Group instruction provided by PowerPoint slides, verbal discussion, and written materials to support subject matter. The instructor gives an explanation and review of healthy diet recommendations, which includes a discussion on weight management, recommendations for fruit and vegetable consumption, as well as protein, fluid, caffeine, fiber, sodium, sugar, and alcohol. Tips for eating when patients are short of breath are discussed.   Pursed Lip Breathing:  -Group instruction that is supported by demonstration and informational handouts. Instructor discusses the benefits of pursed lip and diaphragmatic breathing and detailed demonstration on how to preform both.     Oxygen Safety:  -Group instruction provided by PowerPoint, verbal discussion, and written material to  support subject matter. There is an overview of "What is Oxygen" and "Why do we need it".  Instructor also reviews how to create a safe environment for oxygen use, the importance of using oxygen as prescribed, and the risks of noncompliance. There is a brief discussion on traveling with oxygen and resources the patient may utilize.   Oxygen Equipment:  -Group instruction provided by [facility] Staff utilizing handouts, written materials, and equipment demonstrations.   Signs and Symptoms:  -Group instruction provided by written material and verbal discussion to support subject matter. Warning signs and symptoms of infection, stroke, and heart attack are  reviewed and when to call the physician/911 reinforced. Tips for preventing the spread of infection discussed.   Advanced Directives:  -Group instruction provided by verbal instruction and written material to support subject matter. Instructor reviews Advanced Directive laws and proper instruction for filling out document.   Pulmonary Video:  -Group video education that reviews the importance of medication and oxygen compliance, exercise, good nutrition, pulmonary hygiene, and pursed lip and diaphragmatic breathing for the pulmonary patient.   Exercise for the Pulmonary Patient:  -Group instruction that is supported by a PowerPoint presentation. Instructor discusses benefits of exercise, core components of exercise, frequency, duration, and intensity of an exercise routine, importance of utilizing pulse oximetry during exercise, safety while exercising, and options of places to exercise outside of rehab.     Pulmonary Medications:  -Verbally interactive group education provided by instructor with focus on inhaled medications and proper administration.   Anatomy and Physiology of the Respiratory System and Intimacy:  -Group instruction provided by PowerPoint, verbal discussion, and written material to support subject matter. Instructor  reviews respiratory cycle and anatomical components of the respiratory system and their functions. Instructor also reviews differences in obstructive and restrictive respiratory diseases with examples of each. Intimacy, Sex, and Sexuality differences are reviewed with a discussion on how relationships can change when diagnosed with pulmonary disease. Common sexual concerns are reviewed.   Knowledge Questionnaire Score:   Core Components/Risk Factors/Patient Goals at Admission:     Personal Goals and Risk Factors at Admission - 07/21/16 1041      Core Components/Risk Factors/Patient Goals on Admission    Weight Management Obesity   Increase Strength and Stamina Yes   Improve shortness of breath with ADL's Yes      Core Components/Risk Factors/Patient Goals Review:      Goals and Risk Factor Review    Row Name 07/21/16 1042             Core Components/Risk Factors/Patient Goals Review   Personal Goals Review Weight Management/Obesity;Improve shortness of breath with ADL's;Increase Strength and Stamina          Core Components/Risk Factors/Patient Goals at Discharge (Final Review):      Goals and Risk Factor Review - 07/21/16 1042      Core Components/Risk Factors/Patient Goals Review   Personal Goals Review Weight Management/Obesity;Improve shortness of breath with ADL's;Increase Strength and Stamina      ITP Comments:   Comments:

## 2016-07-29 ENCOUNTER — Encounter (HOSPITAL_COMMUNITY)
Admission: RE | Admit: 2016-07-29 | Discharge: 2016-07-29 | Disposition: A | Discharge status: Home / Self Care | Payer: Commercial Managed Care - HMO | Source: Ambulatory Visit | Attending: Family Medicine | Admitting: Family Medicine

## 2016-07-29 VITALS — Wt 239.9 lb

## 2016-07-29 DIAGNOSIS — R0602 Shortness of breath: Secondary | ICD-10-CM

## 2016-07-29 NOTE — Progress Notes (Signed)
Daily Session Note  Patient Details  Name: Shannon Schultz MRN: [id number] Date of Birth: [birth year] Referring Provider:   April Manson Pulmonary Rehab Walk Test from 07/22/2016 in [place]  Referring Provider  Dr. Tomi Bamberger      Encounter Date: 07/29/2016  Check In:     Session Check In - 07/29/16 1024      Check-In   Location [facility]   Staff Present Rosebud Poles, RN, BSN;Molly diVincenzo, MS, ACSM RCEP, Exercise Physiologist;Brittain Smithey Ysidro Evert, RN;Portia Rollene Rotunda, RN, BSN   Supervising physician immediately available to respond to emergencies [facility] immediately available   Physician(s) Dr. Posey Pronto   Medication changes reported     No   Fall or balance concerns reported    No   Warm-up and Cool-down Performed as group-led instruction   Resistance Training Performed Yes   VAD Patient? No     Pain Assessment   Currently in Pain? No/denies   Multiple Pain Sites No      Capillary Blood Glucose: No results found for this or any previous visit (from the past 24 hour(s)).      Exercise Prescription Changes - 07/29/16 1200      Response to Exercise   Blood Pressure (Admit) 120/70   Blood Pressure (Exercise) 166/78   Blood Pressure (Exit) 140/82   Heart Rate (Admit) 85 bpm   Heart Rate (Exercise) 101 bpm   Heart Rate (Exit) 77 bpm   Oxygen Saturation (Admit) 99 %   Oxygen Saturation (Exercise) 88 %   Oxygen Saturation (Exit) 97 %   Rating of Perceived Exertion (Exercise) 13   Perceived Dyspnea (Exercise) 2   Duration Progress to 45 minutes of aerobic exercise without signs/symptoms of physical distress   Intensity Other (comment)     Progression   Progression --  40-80% of HRR     Resistance Training   Training Prescription Yes   Weight orange bands   Reps 10-12  10 minutes of strength training     Oxygen   Oxygen Continuous   Liters 2     Recumbant Bike   Level 2   Minutes 17     NuStep   Level 2   Minutes 17   METs 1.4     Track   Laps 8   Minutes 17     Goals Met:  Exercise tolerated well No report of cardiac concerns or symptoms Strength training completed today  Goals Unmet:  Not Applicable  Comments: Service time is from 1030 to 1200    Dr. Rush Farmer is Medical Director for Pulmonary Rehab at [facility].

## 2016-07-30 ENCOUNTER — Encounter (HOSPITAL_COMMUNITY): Payer: Self-pay | Admitting: *Deleted

## 2016-07-31 ENCOUNTER — Encounter (HOSPITAL_COMMUNITY)
Admission: RE | Admit: 2016-07-31 | Discharge: 2016-07-31 | Disposition: A | Discharge status: Home / Self Care | Payer: Commercial Managed Care - HMO | Source: Ambulatory Visit | Attending: Family Medicine | Admitting: Family Medicine

## 2016-07-31 VITALS — Wt 238.5 lb

## 2016-07-31 DIAGNOSIS — R0602 Shortness of breath: Secondary | ICD-10-CM | POA: Diagnosis not present

## 2016-07-31 NOTE — Progress Notes (Signed)
Daily Session Note  Patient Details  Name: Shannon Schultz MRN: [id number] Date of Birth: 12/01/1945 Referring Provider:   April Manson Pulmonary Rehab Walk Test from 07/22/2016 in [place]  Referring Provider  Dr. Tomi Bamberger      Encounter Date: 07/31/2016  Check In:     Session Check In - 07/31/16 1030      Check-In   Location [facility]   Staff Present Rosebud Poles, RN, BSN;Lisa Ysidro Evert, RN;Pilar Westergaard Rollene Rotunda, RN, BSN   Supervising physician immediately available to respond to emergencies [facility] immediately available   Physician(s) Dr. Broadus John   Medication changes reported     No   Warm-up and Cool-down Performed as group-led instruction   Resistance Training Performed Yes   VAD Patient? No     Pain Assessment   Currently in Pain? No/denies   Multiple Pain Sites No      Capillary Blood Glucose: No results found for this or any previous visit (from the past 24 hour(s)).     POCT Glucose - 07/31/16 1301      POCT Blood Glucose   Pre-Exercise 180 mg/dL   Post-Exercise 135 mg/dL         Exercise Prescription Changes - 07/31/16 1259      Response to Exercise   Blood Pressure (Admit) 110/60   Blood Pressure (Exercise) 144/70   Blood Pressure (Exit) 110/70   Heart Rate (Admit) 75 bpm   Heart Rate (Exercise) 100 bpm   Heart Rate (Exit) 70 bpm   Oxygen Saturation (Admit) 92 %   Oxygen Saturation (Exercise) 94 %   Oxygen Saturation (Exit) 98 %   Rating of Perceived Exertion (Exercise) 13   Perceived Dyspnea (Exercise) 3   Duration Progress to 45 minutes of aerobic exercise without signs/symptoms of physical distress   Intensity Other (comment)     Progression   Progression --  40-80% of HRR     Resistance Training   Training Prescription Yes   Weight orange bands   Reps 10-12  10 minutes of strength training     Oxygen   Oxygen Continuous   Liters 2     Recumbant Bike   Level 2   Minutes 17     NuStep   Level 2   Minutes 17   METs 1.5     Goals Met:  Exercise tolerated well Queuing for purse lip breathing No report of cardiac concerns or symptoms Strength training completed today  Goals Unmet:  Not Applicable  Comments: Service time is from 1030 to 1230   Dr. Rush Farmer is Medical Director for Pulmonary Rehab at [facility].

## 2016-08-05 ENCOUNTER — Encounter (HOSPITAL_COMMUNITY)
Admission: RE | Admit: 2016-08-05 | Discharge: 2016-08-05 | Disposition: A | Discharge status: Home / Self Care | Payer: Commercial Managed Care - HMO | Source: Ambulatory Visit | Attending: Family Medicine | Admitting: Family Medicine

## 2016-08-05 VITALS — Wt 239.4 lb

## 2016-08-05 DIAGNOSIS — R0602 Shortness of breath: Secondary | ICD-10-CM

## 2016-08-05 NOTE — Progress Notes (Signed)
Daily Session Note  Patient Details  Name: Shannon Schultz MRN: [id number] Date of Birth: 10/26/1945 Referring Provider:   April Manson Pulmonary Rehab Walk Test from 07/22/2016 in [place]  Referring Provider  Dr. Tomi Bamberger      Encounter Date: 08/05/2016  Check In:     Session Check In - 08/05/16 1030      Check-In   Location [facility]   Staff Present Rosebud Poles, RN, BSN;Omario Ander, MS, ACSM RCEP, Exercise Physiologist;Lisa Ysidro Evert, RN;Portia Rollene Rotunda, RN, BSN   Supervising physician immediately available to respond to emergencies [facility] immediately available   Physician(s) Dr. Cathlean Sauer   Medication changes reported     No   Fall or balance concerns reported    No   Warm-up and Cool-down Performed as group-led instruction   Resistance Training Performed Yes   VAD Patient? No     Pain Assessment   Currently in Pain? No/denies   Multiple Pain Sites No      Capillary Blood Glucose: No results found for this or any previous visit (from the past 24 hour(s)).      Exercise Prescription Changes - 08/05/16 1200      Response to Exercise   Blood Pressure (Admit) 126/70   Blood Pressure (Exercise) 128/66   Blood Pressure (Exit) 122/60   Heart Rate (Admit) 71 bpm   Heart Rate (Exercise) 101 bpm   Heart Rate (Exit) 94 bpm   Oxygen Saturation (Admit) 94 %   Oxygen Saturation (Exercise) 91 %   Oxygen Saturation (Exit) 94 %   Rating of Perceived Exertion (Exercise) 15   Perceived Dyspnea (Exercise) 2   Duration Progress to 45 minutes of aerobic exercise without signs/symptoms of physical distress   Intensity Other (comment)     Progression   Progression --  40-80% of HRR     Resistance Training   Training Prescription Yes   Weight orange bands   Reps 10-12  10 minutes of strength training     Oxygen   Oxygen Continuous   Liters 2     Recumbant Bike   Level 2   Minutes 17     NuStep   Level 2   Minutes 17   METs 1.6     Track   Laps 9   Minutes 17     Goals Met:  Exercise tolerated well No report of cardiac concerns or symptoms Strength training completed today  Goals Unmet:  Not Applicable  Comments: Service time is from 10:30am to 12:05p    Dr. Rush Farmer is Medical Director for Pulmonary Rehab at [facility].

## 2016-08-07 ENCOUNTER — Encounter (HOSPITAL_COMMUNITY)
Admission: RE | Admit: 2016-08-07 | Discharge: 2016-08-07 | Disposition: A | Discharge status: Home / Self Care | Payer: Commercial Managed Care - HMO | Source: Ambulatory Visit | Attending: Family Medicine | Admitting: Family Medicine

## 2016-08-07 DIAGNOSIS — R0602 Shortness of breath: Secondary | ICD-10-CM | POA: Diagnosis not present

## 2016-08-07 NOTE — Progress Notes (Signed)
Daily Session Note  Patient Details  Name: Shannon Schultz MRN: [id number] Date of Birth: 11-01-1945 Referring Provider:   April Manson Pulmonary Rehab Walk Test from 07/22/2016 in [place]  Referring Provider  Dr. Tomi Bamberger      Encounter Date: 08/07/2016  Check In:     Session Check In - 08/07/16 1031      Check-In   Location [facility]   Staff Present Rosebud Poles, RN, BSN;Lisa Ysidro Evert, RN;Iviana Blasingame, MS, ACSM RCEP, Exercise Physiologist;Portia Rollene Rotunda, RN, BSN   Supervising physician immediately available to respond to emergencies [facility] immediately available   Physician(s) Dr. Algis Liming   Medication changes reported     No   Fall or balance concerns reported    No   Warm-up and Cool-down Performed as group-led instruction   Resistance Training Performed Yes   VAD Patient? No     Pain Assessment   Currently in Pain? No/denies   Multiple Pain Sites No      Capillary Blood Glucose: No results found for this or any previous visit (from the past 24 hour(s)).      Exercise Prescription Changes - 08/07/16 1300      Response to Exercise   Blood Pressure (Admit) 126/68   Blood Pressure (Exercise) 128/74   Blood Pressure (Exit) 102/60   Heart Rate (Admit) 90 bpm   Heart Rate (Exercise) 107 bpm   Heart Rate (Exit) 95 bpm   Oxygen Saturation (Admit) 89 %   Oxygen Saturation (Exercise) 90 %   Oxygen Saturation (Exit) 98 %   Rating of Perceived Exertion (Exercise) 15   Perceived Dyspnea (Exercise) 2   Duration Progress to 45 minutes of aerobic exercise without signs/symptoms of physical distress   Intensity Other (comment)     Progression   Progression --  40-80% of HRR     Resistance Training   Training Prescription Yes   Weight orange bands   Reps 10-12  10 minutes of strength training     Oxygen   Oxygen Continuous   Liters 2     Recumbant Bike   Level 2  pt felt dizzy for a few minutes   Minutes 17     Track   Laps 10   Minutes 17     Goals Met:  Exercise tolerated well No report of cardiac concerns or symptoms Strength training completed today  Goals Unmet:  Patient stated that she felt dizzy  Comments: Service time is from 10:30am to 12:40p    Dr. Rush Farmer is Medical Director for Pulmonary Rehab at [facility].

## 2016-08-08 ENCOUNTER — Ambulatory Visit (INDEPENDENT_AMBULATORY_CARE_PROVIDER_SITE_OTHER): Payer: Commercial Managed Care - HMO | Admitting: Pulmonary Disease

## 2016-08-08 ENCOUNTER — Encounter: Payer: Self-pay | Admitting: Pulmonary Disease

## 2016-08-08 ENCOUNTER — Other Ambulatory Visit: Payer: Commercial Managed Care - HMO

## 2016-08-08 VITALS — BP 124/72 | HR 99 | Ht 64.5 in | Wt 240.2 lb

## 2016-08-08 DIAGNOSIS — J449 Chronic obstructive pulmonary disease, unspecified: Secondary | ICD-10-CM

## 2016-08-08 DIAGNOSIS — I10 Essential (primary) hypertension: Secondary | ICD-10-CM

## 2016-08-08 DIAGNOSIS — E119 Type 2 diabetes mellitus without complications: Secondary | ICD-10-CM

## 2016-08-08 DIAGNOSIS — Z87891 Personal history of nicotine dependence: Secondary | ICD-10-CM | POA: Diagnosis not present

## 2016-08-08 DIAGNOSIS — Z5181 Encounter for therapeutic drug level monitoring: Secondary | ICD-10-CM

## 2016-08-08 DIAGNOSIS — E7800 Pure hypercholesterolemia, unspecified: Secondary | ICD-10-CM

## 2016-08-08 MED ORDER — FLUTICASONE-UMECLIDIN-VILANT 100-62.5-25 MCG/INH IN AEPB: 1.0000 | INHALATION_SPRAY | Freq: Every day | RESPIRATORY_TRACT | 0 refills | Status: AC

## 2016-08-08 MED ORDER — FLUTICASONE-UMECLIDIN-VILANT 100-62.5-25 MCG/INH IN AEPB: 1.0000 | INHALATION_SPRAY | Freq: Every day | RESPIRATORY_TRACT | 3 refills | Status: DC

## 2016-08-08 NOTE — Addendum Note (Signed)
Addended byMarshell Garfinkel on: 08/08/2016 04:17 PM   Modules accepted: Level of Service

## 2016-08-08 NOTE — Progress Notes (Signed)
[name]    [id number]    Mar 03, 1946  Primary Care Physician:KNAPP,EVE A, MD  Referring Physician: [name], MD [address] [place], [place] [address]  Chief complaint:  Consult for management of COPD  HPI: Shannon Schultz is a 71 year old with COPD, moderate obstructive defect. She was previously seen by Dr. Melvyn Novas in 2013. She has been maintained on Symbicort since 2013. She was hospitalized in February 2017 for the flu and COPD exacerbation.  She is currently in pulmonary rehabilitation. She reports worsening dyspnea on exertion for the past year. She's noticed this more over the past few months. She denies any dyspnea at rest, cough, sputum production, wheezing.  Outpatient Encounter Prescriptions as of 08/08/2016  Medication Sig  . acetaminophen (TYLENOL) 325 MG tablet Take 650 mg by mouth every 6 (six) hours as needed for mild pain, fever or headache. Reported on 10/08/2015  . albuterol (PROVENTIL) (2.5 MG/3ML) 0.083% nebulizer solution USE 1 VIAL BY NEBULIZATION EVERY FOUR HOURS AS NEEDED FOR WHEEZING OR SHORTNESS OF BREATH.  Marland Kitchen aspirin EC 81 MG tablet Take 81 mg by mouth daily.    . bisoprolol-hydrochlorothiazide (ZIAC) 10-6.25 MG tablet TAKE 1 TABLET EVERY DAY  . Calcium Carbonate-Vitamin D (CALCIUM 600+D) 600-400 MG-UNIT per tablet Take 1 tablet by mouth daily.   . cetirizine (KLS ALLER-TEC) 10 MG tablet Take 10 mg by mouth daily.  . Cholecalciferol (VITAMIN D) 2000 UNITS tablet Take 4,000 Units by mouth daily.  . fluticasone (FLONASE) 50 MCG/ACT nasal spray Place 2 sprays into both nostrils daily.  . furosemide (LASIX) 20 MG tablet TAKE 1 TABLET BY MOUTH ONLY IF NEEDED FOR SWELLING  . guaiFENesin (MUCINEX) 600 MG 12 hr tablet Take 600 mg by mouth 2 (two) times daily.  . metFORMIN (GLUCOPHAGE-XR) 500 MG 24 hr tablet TAKE 1 TABLET (500 MG TOTAL) BY MOUTH 3 (THREE) TIMES DAILY.  . Multiple Vitamins-Minerals (CENTRUM SILVER PO) Take 1 tablet by mouth.    .  neomycin-polymyxin-hydrocortisone (CORTISPORIN) otic solution Place 4 drops into the left ear 4 (four) times daily. Use for 7-10 days  . PROAIR HFA 108 (90 Base) MCG/ACT inhaler INHALE 2 PUFFS INTO THE LUNGS EVERY 6 (SIX) HOURS AS NEEDED FOR WHEEZING.  . simvastatin (ZOCOR) 20 MG tablet TAKE 1 TABLET AT BEDTIME  . SYMBICORT 160-4.5 MCG/ACT inhaler INHALE 2 PUFFS INTO THE LUNGS 2 (TWO) TIMES DAILY.  Marland Kitchen TRUEPLUS LANCETS 33G MISC TEST ONE TIME DAILY  . valACYclovir (VALTREX) 500 MG tablet TAKE 1 TABLET EVERY DAY  . [DISCONTINUED] amoxicillin-clavulanate (AUGMENTIN) 875-125 MG tablet Take 1 tablet by mouth 2 (two) times daily.  . [DISCONTINUED] benzonatate (TESSALON) 200 MG capsule Take 1 capsule (200 mg total) by mouth 3 (three) times daily as needed.  . [DISCONTINUED] predniSONE (DELTASONE) 10 MG tablet Take as directed, once daily (60/50/40/30/20/10/10)   No facility-administered encounter medications on file as of 08/08/2016.     Allergies as of 08/08/2016 - Review Complete 08/08/2016  Allergen Reaction Noted  . Lisinopril Shortness Of Breath and Rash 05/15/2014  . Contrast media [iodinated diagnostic agents] Other (See Comments) 06/06/2011  . Iohexol Other (See Comments) 06/25/2009  . Latex  06/25/2011  . Levofloxacin Other (See Comments) 11/11/2010  . Sulfa antibiotics Other (See Comments) 11/11/2010  . Codeine Nausea Only, Anxiety, and Other (See Comments) 11/11/2010  . Lipitor [atorvastatin calcium] Rash 11/11/2010    Past Medical History:  Diagnosis Date  . Allergy   . Cancer ([place]) 1992  R breast, DCIS  . Colon polyp   . COPD (chronic obstructive pulmonary disease) ([place])   . Diverticulosis   . Elevated cholesterol   . Elevated liver enzymes    fatty liver per ultrasound per pt  . Family history of malignant neoplasm of breast   . FHx: BRCA2 gene positive    sister with BRCA2 mutation (pt tested NEGATIVE)  . Genital HSV    gets on hip  . Heart murmur     echo 05/2011 mild  aortic stenosis  . HSV (herpes simplex virus) infection    on hip--on daily suppression  . Hypertension   . Impaired glucose tolerance   . Migraine   . Osteopenia   . Pneumonia 06/06/2011  . Type 2 diabetes mellitus ([place])   . Vitamin D deficiency disease     Past Surgical History:  Procedure Laterality Date  . CHOLECYSTECTOMY  2003  . COLONOSCOPY  2009, 08/2010, 12/2015   Dr. Collene Mares  . HERNIA REPAIR  [id number]   umbilical  . MASTECTOMY Right 1992   R breast for cancer    Family History  Problem Relation Age of Onset  . Heart disease Father   . Diabetes Father   . Hypertension Father   . Asthma Sister   . Allergies Sister   . Hyperlipidemia Sister   . Hashimoto's thyroiditis Sister   . Heart disease Mother     tachycardia  . Asthma Sister   . Allergies Sister   . Hyperlipidemia Sister   . Hashimoto's thyroiditis Sister   . Breast cancer Sister 41    BRCA2 positive; metastatic to bones at age 59  . Cancer Other     female cancers, bone cancer  . Cancer Maternal Grandmother     deceased 30; unk. primary; possibly stomach  . Tuberculosis Maternal Grandfather   . Stroke Paternal Grandmother 43    died of cerebral hemorrhage  . Breast cancer Other     Social History   Social History  . Marital status: Married    Spouse name: N/A  . Number of children: 3  . Years of education: N/A   Occupational History  . Retired    Social History Main Topics  . Smoking status: Former Smoker    Packs/day: 1.00    Years: 46.00    Types: Cigarettes    Quit date: 05/31/2011  . Smokeless tobacco: Never Used  . Alcohol use No     Comment: rarely, 1 drink per year maybe  . Drug use: No  . Sexual activity: Yes    Partners: Male    Birth control/ protection: Post-menopausal   Other Topics Concern  . Not on file   Social History Narrative   Lives with her husband.  Children all live in [place] nearby. No pets. 5 grandchildren    Review of systems: Review of Systems    Constitutional: Negative for fever and chills.  HENT: Negative.   Eyes: Negative for blurred vision.  Respiratory: as per HPI  Cardiovascular: Negative for chest pain and palpitations.  Gastrointestinal: Negative for vomiting, diarrhea, blood per rectum. Genitourinary: Negative for dysuria, urgency, frequency and hematuria.  Musculoskeletal: Negative for myalgias, back pain and joint pain.  Skin: Negative for itching and rash.  Neurological: Negative for dizziness, tremors, focal weakness, seizures and loss of consciousness.  Endo/Heme/Allergies: Negative for environmental allergies.  Psychiatric/Behavioral: Negative for depression, suicidal ideas and hallucinations.  All other systems reviewed and are negative.  Physical Exam: Blood pressure  124/72, pulse 99, height 5' 4.5" (1.638 m), weight 240 lb 3.2 oz (109 kg), SpO2 94 %. Gen:      No acute distress HEENT:  EOMI, sclera anicteric Neck:     No masses; no thyromegaly Lungs:    Clear to auscultation bilaterally; normal respiratory effort CV:         Regular rate and rhythm; no murmurs Abd:      + bowel sounds; soft, non-tender; no palpable masses, no distension Ext:    No edema; adequate peripheral perfusion Skin:      Warm and dry; no rash Neuro: alert and oriented x 3 Psych: normal mood and affect  Data Reviewed: Chest x-ray 08/16/15-hyperinflation consistent with COPD. No acute pulmonary abnormality. Chest x-ray 9//16-no acute cardio pulmonary abnormality All images personally reviewed  PFTs 11/08/15 FVC 2.05 [7%) FEV1 1.23 (55%) F/F 60 Moderate obstructive defect  08/28/11 FVC 2.71 [92%] FEV1 1.33 (62%) F/F 49 TLC 114% DLCO 46% Moderate obstructive defect with moderate reduction in diffusion capacity. No bronchodilator response  Assessment:  COPD, moderate obstructive defect Evaluation for progressive worsening of dyspnea. I will stop the Symbicort and start her on triple therapy with Trelegy inhaler. We'll  evaluate her lung function with a chest x-ray today and repeat pulmonary function tests. Check 1 antitrypsin levels and phenotype.   She continues to do well with pulmonary rehabilitation and is a candidate for low-dose screening CTs  Plan/Recommendations: - Stop symbicort, start trelegy inhaler - Check CXR, PFTs - Check A1AT level and phenotype - Referral for screening CTs of the chest.   Marshell Garfinkel MD [facility] Pager [phone] 08/08/2016, 3:38 PM  CC: [name], MD

## 2016-08-08 NOTE — Addendum Note (Signed)
Addended by: Maryanna Shape A on: 08/08/2016 04:34 PM   Modules accepted: Orders

## 2016-08-08 NOTE — Patient Instructions (Addendum)
We'll stop the Symbicort and given samples of trelegy inhaler along with a prescription Schedule for pulmonary function tests for reevaluation of lung function Check alpha 1 antitrypsin levels and phenotype  Continue the pulmonary rehabilitation  Return in 1-2 months

## 2016-08-11 ENCOUNTER — Telehealth: Payer: Self-pay | Admitting: Pulmonary Disease

## 2016-08-11 DIAGNOSIS — J441 Chronic obstructive pulmonary disease with (acute) exacerbation: Secondary | ICD-10-CM

## 2016-08-11 NOTE — Telephone Encounter (Addendum)
Pt. Follow up appointment had to be moved so she can have the PFT on the same day. Pt. Is aware of her new appointment. Apologized for the delay in getting her scheduled. Nothing further is needed at this time

## 2016-08-12 ENCOUNTER — Encounter (HOSPITAL_COMMUNITY)
Admission: RE | Admit: 2016-08-12 | Discharge: 2016-08-12 | Disposition: A | Discharge status: Home / Self Care | Payer: Commercial Managed Care - HMO | Source: Ambulatory Visit | Attending: Family Medicine | Admitting: Family Medicine

## 2016-08-12 VITALS — Wt 239.2 lb

## 2016-08-12 DIAGNOSIS — R0602 Shortness of breath: Secondary | ICD-10-CM | POA: Diagnosis not present

## 2016-08-12 NOTE — Progress Notes (Signed)
Daily Session Note  Patient Details  Name: Shannon Schultz MRN: [id number] Date of Birth: 11-29-1945 Referring Provider:   April Manson Pulmonary Rehab Walk Test from 07/22/2016 in [place]  Referring Provider  Dr. Tomi Bamberger      Encounter Date: 08/12/2016  Check In:     Session Check In - 08/12/16 1030      Check-In   Location [facility]   Staff Present Rosebud Poles, RN, BSN;Molly diVincenzo, MS, ACSM RCEP, Exercise Physiologist;Xyla Leisner Ysidro Evert, RN;Portia Rollene Rotunda, RN, BSN   Supervising physician immediately available to respond to emergencies [facility] immediately available   Physician(s) Dr. Posey Pronto   Medication changes reported     No   Fall or balance concerns reported    No   Warm-up and Cool-down Performed as group-led instruction   Resistance Training Performed Yes   VAD Patient? No     Pain Assessment   Currently in Pain? No/denies   Multiple Pain Sites No      Capillary Blood Glucose: No results found for this or any previous visit (from the past 24 hour(s)).     POCT Glucose - 08/12/16 1230      POCT Blood Glucose   Pre-Exercise 189 mg/dL   Post-Exercise 126 mg/dL         Exercise Prescription Changes - 08/12/16 1200      Response to Exercise   Blood Pressure (Admit) 110/60   Blood Pressure (Exercise) 160/62   Blood Pressure (Exit) 100/64   Heart Rate (Admit) 82 bpm   Heart Rate (Exercise) 107 bpm   Heart Rate (Exit) 94 bpm   Oxygen Saturation (Admit) 93 %   Oxygen Saturation (Exercise) 93 %   Oxygen Saturation (Exit) 94 %   Rating of Perceived Exertion (Exercise) 13   Perceived Dyspnea (Exercise) 1   Duration Progress to 45 minutes of aerobic exercise without signs/symptoms of physical distress   Intensity THRR unchanged     Progression   Progression Continue to progress workloads to maintain intensity without signs/symptoms of physical distress.     Resistance Training   Training Prescription  Yes   Weight orange bands'   Reps 10-12  10 minutes of strength training     Oxygen   Oxygen Continuous   Liters 2     Recumbant Bike   Level 2   Minutes 17     NuStep   Level 2   Minutes 17   METs 1.7     Track   Laps 13   Minutes 17     Goals Met:  Exercise tolerated well No report of cardiac concerns or symptoms Strength training completed today  Goals Unmet:  Not Applicable  Comments: Service time is from 1030 to 1205    Dr. Rush Farmer is Medical Director for Pulmonary Rehab at [facility].

## 2016-08-13 LAB — ALPHA-1 ANTITRYPSIN PHENOTYPE: A-1 Antitrypsin: 162 mg/dL (ref 83–199)

## 2016-08-14 ENCOUNTER — Encounter (HOSPITAL_COMMUNITY)
Admission: RE | Admit: 2016-08-14 | Discharge: 2016-08-14 | Disposition: A | Discharge status: Home / Self Care | Payer: Commercial Managed Care - HMO | Source: Ambulatory Visit | Attending: Family Medicine | Admitting: Family Medicine

## 2016-08-14 VITALS — Wt 239.6 lb

## 2016-08-14 DIAGNOSIS — R0602 Shortness of breath: Secondary | ICD-10-CM

## 2016-08-14 NOTE — Progress Notes (Signed)
Daily Session Note  Patient Details  Name: Shannon Schultz MRN: [id number] Date of Birth: 12-Jan-1946 Referring Provider:   April Manson Pulmonary Rehab Walk Test from 07/22/2016 in [place]  Referring Provider  Dr. Tomi Bamberger      Encounter Date: 08/14/2016  Check In:     Session Check In - 08/14/16 1030      Check-In   Location [facility]   Staff Present Rosebud Poles, RN, BSN;Molly diVincenzo, MS, ACSM RCEP, Exercise Physiologist;Lisa Ysidro Evert, RN;Portia Rollene Rotunda, RN, BSN   Supervising physician immediately available to respond to emergencies [facility] immediately available   Physician(s) Dr. Algis Liming   Medication changes reported     No   Fall or balance concerns reported    No   Warm-up and Cool-down Performed as group-led instruction   Resistance Training Performed Yes   VAD Patient? No     Pain Assessment   Currently in Pain? No/denies   Multiple Pain Sites No      Capillary Blood Glucose: No results found for this or any previous visit (from the past 24 hour(s)).      Exercise Prescription Changes - 08/14/16 1200      Response to Exercise   Blood Pressure (Admit) 110/56   Blood Pressure (Exercise) 158/80   Blood Pressure (Exit) 128/70   Heart Rate (Admit) 70 bpm   Heart Rate (Exercise) 104 bpm   Heart Rate (Exit) 79 bpm   Oxygen Saturation (Admit) 95 %   Oxygen Saturation (Exercise) 93 %   Oxygen Saturation (Exit) 97 %   Rating of Perceived Exertion (Exercise) 13   Perceived Dyspnea (Exercise) 3   Duration Progress to 45 minutes of aerobic exercise without signs/symptoms of physical distress   Intensity THRR unchanged     Progression   Progression Continue to progress workloads to maintain intensity without signs/symptoms of physical distress.     Resistance Training   Training Prescription Yes   Weight orange bands'   Reps 10-12  10 minutes of strength training     Oxygen   Oxygen Continuous   Liters 2     Recumbant Bike   Level 2   Minutes 17     NuStep   Level 2   Minutes 17   METs 1.7     Goals Met:  Exercise tolerated well Strength training completed today  Goals Unmet:  Not Applicable  Comments: Service time is from 1030 to 1200    Dr. Rush Farmer is Medical Director for Pulmonary Rehab at [facility].

## 2016-08-14 NOTE — Progress Notes (Signed)
Shannon Schultz 71 y.o. female Nutrition Note Spoke with pt. Pt is obese and wants to lose wt. Per discussion, pt has been on Weight Watchers and lost over 20 lb. Pre-Weight Watcher's weight reportedly 250 lb. Pt c/o gaining some weight back due to emotional eating with her sister being dx with metastatic CA and a femur fx. Ways to cope with emotional eating discussed. There are some ways the pt can make her eating habits healthier. Pt's Rate Your Plate results reviewed with pt. Pt does not avoid salty food at this time. The role of sodium in lung disease reviewed with pt. Pt is diabetic. Last A1c indicates blood glucose well-controlled. Pt checks fasting CBG's daily. Fasting CBG's reportedly ~120 mg/dL. Pt reports daily foot exams. Pt expressed understanding of the information reviewed via feedback method.    Lab Results  Component Value Date   HGBA1C 5.6 02/11/2016   Nutrition Diagnosis ? Food-and nutrition-related knowledge deficit related to lack of exposure to information as related to diagnosis of pulmonary disease ? Overweight/obesity related to excessive energy intake as evidenced by a BMI of   Nutrition Intervention ? Pt's individual nutrition plan and goals reviewed with pt. ? Benefits of adopting healthy eating habits discussed when pt's Rate Your Plate reviewed. ? Pt to attend the Nutrition and Lung Disease class ? Continual client-centered nutrition education by RD, as part of interdisciplinary care. Goal(s) 1. Identify food quantities necessary to achieve wt loss of  -2# per week to a goal wt loss of 2.7-10.9 kg (6-24 lb) at graduation from pulmonary rehab. Monitor and Evaluate progress toward nutrition goal with team.   Derek Mound, M.Ed, RD, LDN, CDE 08/14/2016 12:10 PM

## 2016-08-14 NOTE — Progress Notes (Signed)
Pulmonary Individual Treatment Plan  Patient Details  Name: Shannon Schultz MRN: [id number] Date of Birth: July 08, 1945 Referring Provider:   April Manson Pulmonary Rehab Walk Test from 07/22/2016 in [place]  Referring Provider  Dr. Tomi Bamberger      Initial Encounter Date:  Flowsheet Row Pulmonary Rehab Walk Test from 07/22/2016 in [place]  Date  07/22/16  Referring Provider  Dr. Tomi Bamberger      Visit Diagnosis: Shortness of breath  Patient's Home Medications on Admission:   Current Outpatient Prescriptions:  .  acetaminophen (TYLENOL) 325 MG tablet, Take 650 mg by mouth every 6 (six) hours as needed for mild pain, fever or headache. Reported on 10/08/2015, Disp: , Rfl:  .  albuterol (PROVENTIL) (2.5 MG/3ML) 0.083% nebulizer solution, USE 1 VIAL BY NEBULIZATION EVERY FOUR HOURS AS NEEDED FOR WHEEZING OR SHORTNESS OF BREATH., Disp: 270 mL, Rfl: 0 .  aspirin EC 81 MG tablet, Take 81 mg by mouth daily.  , Disp: , Rfl:  .  bisoprolol-hydrochlorothiazide (ZIAC) 10-6.25 MG tablet, TAKE 1 TABLET EVERY DAY, Disp: 90 tablet, Rfl: 1 .  Calcium Carbonate-Vitamin D (CALCIUM 600+D) 600-400 MG-UNIT per tablet, Take 1 tablet by mouth daily. , Disp: , Rfl:  .  cetirizine (KLS ALLER-TEC) 10 MG tablet, Take 10 mg by mouth daily., Disp: , Rfl:  .  Cholecalciferol (VITAMIN D) 2000 UNITS tablet, Take 4,000 Units by mouth daily., Disp: , Rfl:  .  fluticasone (FLONASE) 50 MCG/ACT nasal spray, Place 2 sprays into both nostrils daily., Disp: 16 g, Rfl: 2 .  Fluticasone-Umeclidin-Vilant (TRELEGY ELLIPTA) 100-62.5-25 MCG/INH AEPB, Inhale 1 puff into the lungs daily., Disp: 180 each, Rfl: 3 .  furosemide (LASIX) 20 MG tablet, TAKE 1 TABLET BY MOUTH ONLY IF NEEDED FOR SWELLING, Disp: 30 tablet, Rfl: 2 .  guaiFENesin (MUCINEX) 600 MG 12 hr tablet, Take 600 mg by mouth 2 (two) times daily., Disp: , Rfl:  .  metFORMIN (GLUCOPHAGE-XR) 500 MG 24 hr tablet, TAKE 1  TABLET (500 MG TOTAL) BY MOUTH 3 (THREE) TIMES DAILY., Disp: 270 tablet, Rfl: 0 .  Multiple Vitamins-Minerals (CENTRUM SILVER PO), Take 1 tablet by mouth.  , Disp: , Rfl:  .  neomycin-polymyxin-hydrocortisone (CORTISPORIN) otic solution, Place 4 drops into the left ear 4 (four) times daily. Use for 7-10 days, Disp: 10 mL, Rfl: 0 .  PROAIR HFA 108 (90 Base) MCG/ACT inhaler, INHALE 2 PUFFS INTO THE LUNGS EVERY 6 (SIX) HOURS AS NEEDED FOR WHEEZING., Disp: 54 g, Rfl: 0 .  simvastatin (ZOCOR) 20 MG tablet, TAKE 1 TABLET AT BEDTIME, Disp: 90 tablet, Rfl: 0 .  SYMBICORT 160-4.5 MCG/ACT inhaler, INHALE 2 PUFFS INTO THE LUNGS 2 (TWO) TIMES DAILY., Disp: 3 Inhaler, Rfl: 1 .  TRUEPLUS LANCETS 33G MISC, TEST ONE TIME DAILY, Disp: 100 each, Rfl: 3 .  valACYclovir (VALTREX) 500 MG tablet, TAKE 1 TABLET EVERY DAY, Disp: 90 tablet, Rfl: 3  Past Medical History: Past Medical History:  Diagnosis Date  . Allergy   . Cancer ([place]) 1992   R breast, DCIS  . Colon polyp   . COPD (chronic obstructive pulmonary disease) ([place])   . Diverticulosis   . Elevated cholesterol   . Elevated liver enzymes    fatty liver per ultrasound per pt  . Family history of malignant neoplasm of breast   . FHx: BRCA2 gene positive    sister with BRCA2 mutation (pt tested NEGATIVE)  . Genital HSV  gets on hip  . Heart murmur     echo 05/2011 mild aortic stenosis  . HSV (herpes simplex virus) infection    on hip--on daily suppression  . Hypertension   . Impaired glucose tolerance   . Migraine   . Osteopenia   . Pneumonia 06/06/2011  . Type 2 diabetes mellitus ([place])   . Vitamin D deficiency disease     Tobacco Use: History  Smoking Status  . Former Smoker  . Packs/day: 1.00  . Years: 46.00  . Types: Cigarettes  . Quit date: 05/31/2011  Smokeless Tobacco  . Never Used    Labs: Recent Review Flowsheet Data    Labs for ITP Cardiac and Pulmonary Rehab Latest Ref Rng & Units 10/05/2014 01/04/2015 07/19/2015 11/08/2015  02/11/2016   Cholestrol 125 - 200 mg/dL - 110 124(L) - -   LDLCALC <130 mg/dL - 47 57 - -   HDL >=46 mg/dL - 44(L) 50 - -   Trlycerides <150 mg/dL - 96 84 - -   Hemoglobin A1c <5.7 % 6.9 6.7(H) 7.2(H) 6.3 5.6   PHART 7.350 - 7.400 - - - - -   PCO2ART 35.0 - 45.0 mmHg - - - - -   HCO3 20.0 - 24.0 mEq/L - - - - -   TCO2 0 - 100 mmol/L - - - - -   O2SAT % - - - - -      Capillary Blood Glucose: Lab Results  Component Value Date   GLUCAP 304 (H) 08/18/2015   GLUCAP 222 (H) 08/18/2015   GLUCAP 237 (H) 08/17/2015   GLUCAP 167 (H) 08/17/2015   GLUCAP 258 (H) 08/17/2015       POCT Glucose    Row Name 07/29/16 1218 07/31/16 1301 08/05/16 1210 08/12/16 1230       POCT Blood Glucose   Pre-Exercise 196 mg/dL 180 mg/dL 164 mg/dL 189 mg/dL    Post-Exercise 108 mg/dL 135 mg/dL 124 mg/dL 126 mg/dL       ADL UCSD:     Pulmonary Assessment Scores    Row Name 07/30/16 1452         ADL UCSD   ADL Phase Entry     SOB Score total 56        Pulmonary Function Assessment:     Pulmonary Function Assessment - 07/21/16 1034      Breath   Bilateral Breath Sounds Clear   Shortness of Breath Yes;Limiting activity      Exercise Target Goals:    Exercise Program Goal: Individual exercise prescription set with THRR, safety & activity barriers. Participant demonstrates ability to understand and report RPE using BORG scale, to self-measure pulse accurately, and to acknowledge the importance of the exercise prescription.  Exercise Prescription Goal: Starting with aerobic activity 30 plus minutes a day, 3 days per week for initial exercise prescription. Provide home exercise prescription and guidelines that participant acknowledges understanding prior to discharge.  Activity Barriers & Risk Stratification:     Activity Barriers & Cardiac Risk Stratification - 07/21/16 1032      Activity Barriers & Cardiac Risk Stratification   Activity Barriers Arthritis  in R kneeand hands       6 Minute Walk:     6 Minute Walk    Row Name 07/22/16 1623         6 Minute Walk   Phase Initial     Distance 810 feet     Walk Time 6 minutes     #  of Rest Breaks 1  1 minute 11 seconds     MPH 1.53     METS 2.15     RPE 13     Perceived Dyspnea  3     Symptoms No     Resting HR 92 bpm     Resting BP 128/70     Max Ex. HR 120 bpm     Max Ex. BP 142/80       Interval HR   Baseline HR 92     1 Minute HR 102     2 Minute HR 117     3 Minute HR 120     4 Minute HR 119     5 Minute HR 117     6 Minute HR 109     2 Minute Post HR 103     Interval Heart Rate? Yes       Interval Oxygen   Interval Oxygen? Yes     Baseline Oxygen Saturation % 92 %     Baseline Liters of Oxygen 0 L     1 Minute Oxygen Saturation % 89 %     1 Minute Liters of Oxygen 0 L     2 Minute Oxygen Saturation % 84 %     2 Minute Liters of Oxygen 0 L     3 Minute Oxygen Saturation % 81 %     3 Minute Liters of Oxygen 0 L     4 Minute Oxygen Saturation % 89 %     4 Minute Liters of Oxygen 2 L     5 Minute Oxygen Saturation % 88 %     5 Minute Liters of Oxygen 2 L     6 Minute Oxygen Saturation % 86 %     6 Minute Liters of Oxygen 2 L     2 Minute Post Oxygen Saturation % 92 %     2 Minute Post Liters of Oxygen 2 L        Initial Exercise Prescription:     Initial Exercise Prescription - 07/22/16 1600      Date of Initial Exercise RX and Referring Provider   Date 07/22/16   Referring Provider Dr. Tomi Bamberger     Oxygen   Oxygen Continuous   Liters 2     Recumbant Bike   Level 2   Minutes 17   METs 1.5     NuStep   Level 2   Minutes 17   METs 1.5     Track   Laps 5   Minutes 17     Prescription Details   Frequency (times per week) 2   Duration Progress to 45 minutes of aerobic exercise without signs/symptoms of physical distress     Intensity   THRR 40-80% of Max Heartrate 60-120   Ratings of Perceived Exertion 11-13   Perceived Dyspnea 0-4     Progression    Progression Continue progressive overload as per policy without signs/symptoms or physical distress.     Resistance Training   Training Prescription Yes   Weight ORANGE BANDS   Reps 10-12      Perform Capillary Blood Glucose checks as needed.  Exercise Prescription Changes:     Exercise Prescription Changes    Row Name 07/29/16 1200 07/31/16 1259 08/05/16 1200 08/07/16 1300 08/12/16 1200     Response to Exercise   Blood Pressure (Admit) 120/70 110/60 126/70 126/68 110/60   Blood Pressure (Exercise) 166/78 144/70 128/66  128/74 160/62   Blood Pressure (Exit) 140/82 110/70 122/60 102/60 100/64   Heart Rate (Admit) 85 bpm 75 bpm 71 bpm 90 bpm 82 bpm   Heart Rate (Exercise) 101 bpm 100 bpm 101 bpm 107 bpm 107 bpm   Heart Rate (Exit) 77 bpm 70 bpm 94 bpm 95 bpm 94 bpm   Oxygen Saturation (Admit) 99 % 92 % 94 % 89 % 93 %   Oxygen Saturation (Exercise) 88 % 94 % 91 % 90 % 93 %   Oxygen Saturation (Exit) 97 % 98 % 94 % 98 % 94 %   Rating of Perceived Exertion (Exercise) '13 13 15 15 13   ' Perceived Dyspnea (Exercise) '2 3 2 2 1   ' Duration Progress to 45 minutes of aerobic exercise without signs/symptoms of physical distress Progress to 45 minutes of aerobic exercise without signs/symptoms of physical distress Progress to 45 minutes of aerobic exercise without signs/symptoms of physical distress Progress to 45 minutes of aerobic exercise without signs/symptoms of physical distress Progress to 45 minutes of aerobic exercise without signs/symptoms of physical distress   Intensity Other (comment) Other (comment) Other (comment) Other (comment) THRR unchanged     Progression   Progression -  40-80% of HRR -  40-80% of HRR -  40-80% of HRR -  40-80% of HRR Continue to progress workloads to maintain intensity without signs/symptoms of physical distress.     Resistance Training   Training Prescription Yes Yes Yes Yes Yes   Weight orange bands orange bands orange bands orange bands orange bands'    Reps 10-12  10 minutes of strength training 10-12  10 minutes of strength training 10-12  10 minutes of strength training 10-12  10 minutes of strength training 10-12  10 minutes of strength training     Oxygen   Oxygen Continuous Continuous Continuous Continuous Continuous   Liters '2 2 2 2 2     ' Recumbant Bike   Level '2 2 2 2  ' pt felt dizzy for a few minutes 2   Minutes '17 17 17 17 17     ' NuStep   Level '2 2 2  ' - 2   Minutes '17 17 17  ' - 17   METs 1.4 1.5 1.6  - 1.7     Track   Laps 8  - '9 10 13   ' Minutes 17  - '17 17 17      ' Exercise Comments:     Exercise Comments    Row Name 08/11/16 1509           Exercise Comments Patient has only attended 4 sessions. She is on 2 liters of oxygen during exercise which is new to her. She is walking up to 10 laps in 15 minutes. Will cont. to monitor and progress.           Discharge Exercise Prescription (Final Exercise Prescription Changes):     Exercise Prescription Changes - 08/12/16 1200      Response to Exercise   Blood Pressure (Admit) 110/60   Blood Pressure (Exercise) 160/62   Blood Pressure (Exit) 100/64   Heart Rate (Admit) 82 bpm   Heart Rate (Exercise) 107 bpm   Heart Rate (Exit) 94 bpm   Oxygen Saturation (Admit) 93 %   Oxygen Saturation (Exercise) 93 %   Oxygen Saturation (Exit) 94 %   Rating of Perceived Exertion (Exercise) 13   Perceived Dyspnea (Exercise) 1   Duration Progress to 45 minutes of aerobic exercise  without signs/symptoms of physical distress   Intensity THRR unchanged     Progression   Progression Continue to progress workloads to maintain intensity without signs/symptoms of physical distress.     Resistance Training   Training Prescription Yes   Weight orange bands'   Reps 10-12  10 minutes of strength training     Oxygen   Oxygen Continuous   Liters 2     Recumbant Bike   Level 2   Minutes 17     NuStep   Level 2   Minutes 17   METs 1.7     Track   Laps 13   Minutes  17       Nutrition:  Target Goals: Understanding of nutrition guidelines, daily intake of sodium <1574m, cholesterol <2057m calories 30% from fat and 7% or less from saturated fats, daily to have 5 or more servings of fruits and vegetables.  Biometrics:     Pre Biometrics - 07/21/16 1046      Pre Biometrics   Grip Strength 35 kg       Nutrition Therapy Plan and Nutrition Goals:     Nutrition Therapy & Goals - 07/30/16 1659      Nutrition Therapy   Diet Therapeutic Lifestyle Changes     Personal Nutrition Goals   Personal Goal #1 1-2 lb wt loss/week to a wt loss goal of 6-24 lb at graduation from [employer] and counsel regarding individualized specific dietary modifications aiming towards targeted core components such as weight, hypertension, lipid management, diabetes, heart failure and other comorbidities.   Expected Outcomes Short Term Goal: Understand basic principles of dietary content, such as calories, fat, sodium, cholesterol and nutrients.;Long Term Goal: Adherence to prescribed nutrition plan.      Nutrition Discharge: Rate Your Plate Scores:     Nutrition Assessments - 07/30/16 1659      MEDFICTS Scores   Pre Score 55      Psychosocial: Target Goals: Acknowledge presence or absence of depression, maximize coping skills, provide positive support system. Participant is able to verbalize types and ability to use techniques and skills needed for reducing stress and depression.  Initial Review & Psychosocial Screening:     Initial Psych Review & Screening - 07/21/16 1043      Family Dynamics   Good Support System? Yes     Barriers   Psychosocial barriers to participate in program There are no identifiable barriers or psychosocial needs.     Screening Interventions   Interventions Encouraged to exercise      Quality of Life Scores:     Quality of Life - 07/30/16 1453      Quality  of Life Scores   Health/Function Pre 18 %   Socioeconomic Pre 22 %   Psych/Spiritual Pre 19.71 %   Family Pre 25.5 %   GLOBAL Pre 20.23 %      PHQ-9: Recent Review Flowsheet Data    [facility] 07/21/2016 02/14/2016 01/10/2015 07/06/2013   Decreased Interest 0 0 0 0   Down, Depressed, Hopeless 0 0 0 0   PHQ - 2 Score 0 0 0 0      Psychosocial Evaluation and Intervention:     Psychosocial Evaluation - 07/21/16 1045      Psychosocial Evaluation & Interventions   Interventions Encouraged to exercise with the program and follow exercise prescription   Comments No psychosocial issues identified  Continued Psychosocial Services Needed No     Discharge Psychosocial Assessment & Intervention   Discharge Continue support measures as needed      Psychosocial Re-Evaluation:     Psychosocial Re-Evaluation    [place] Name 08/11/16 1558             Psychosocial Re-Evaluation   Interventions Encouraged to attend Pulmonary Rehabilitation for the exercise       Comments no psychosocial barriers identified since admission         Education: Education Goals: Education classes will be provided on a weekly basis, covering required topics. Participant will state understanding/return demonstration of topics presented.  Learning Barriers/Preferences:     Learning Barriers/Preferences - 07/21/16 1034      Learning Barriers/Preferences   Learning Barriers None   Learning Preferences Written Material;Video;Verbal Instruction;Skilled Demonstration;Pictoral;Individual Instruction;Group Instruction;Computer/Internet;Audio      Education Topics: Risk Factor Reduction:  -Group instruction that is supported by a PowerPoint presentation. Instructor discusses the definition of a risk factor, different risk factors for pulmonary disease, and how the heart and lungs work together.     Nutrition for Pulmonary Patient:  -Group instruction provided by PowerPoint slides, verbal  discussion, and written materials to support subject matter. The instructor gives an explanation and review of healthy diet recommendations, which includes a discussion on weight management, recommendations for fruit and vegetable consumption, as well as protein, fluid, caffeine, fiber, sodium, sugar, and alcohol. Tips for eating when patients are short of breath are discussed.   Pursed Lip Breathing:  -Group instruction that is supported by demonstration and informational handouts. Instructor discusses the benefits of pursed lip and diaphragmatic breathing and detailed demonstration on how to preform both.     Oxygen Safety:  -Group instruction provided by PowerPoint, verbal discussion, and written material to support subject matter. There is an overview of "What is Oxygen" and "Why do we need it".  Instructor also reviews how to create a safe environment for oxygen use, the importance of using oxygen as prescribed, and the risks of noncompliance. There is a brief discussion on traveling with oxygen and resources the patient may utilize.   Oxygen Equipment:  -Group instruction provided by [facility] Staff utilizing handouts, written materials, and equipment demonstrations.   Signs and Symptoms:  -Group instruction provided by written material and verbal discussion to support subject matter. Warning signs and symptoms of infection, stroke, and heart attack are reviewed and when to call the physician/911 reinforced. Tips for preventing the spread of infection discussed. Flowsheet Row PULMONARY REHAB OTHER RESPIRATORY from 08/07/2016 in [place]  Date  08/07/16  Educator  MD  Instruction Review Code  2- meets goals/outcomes      Advanced Directives:  -Group instruction provided by verbal instruction and written material to support subject matter. Instructor reviews Advanced Directive laws and proper instruction for filling out document.   Pulmonary Video:   -Group video education that reviews the importance of medication and oxygen compliance, exercise, good nutrition, pulmonary hygiene, and pursed lip and diaphragmatic breathing for the pulmonary patient.   Exercise for the Pulmonary Patient:  -Group instruction that is supported by a PowerPoint presentation. Instructor discusses benefits of exercise, core components of exercise, frequency, duration, and intensity of an exercise routine, importance of utilizing pulse oximetry during exercise, safety while exercising, and options of places to exercise outside of rehab.     Pulmonary Medications:  -Verbally interactive group education provided by instructor with focus on inhaled  medications and proper administration.   Anatomy and Physiology of the Respiratory System and Intimacy:  -Group instruction provided by PowerPoint, verbal discussion, and written material to support subject matter. Instructor reviews respiratory cycle and anatomical components of the respiratory system and their functions. Instructor also reviews differences in obstructive and restrictive respiratory diseases with examples of each. Intimacy, Sex, and Sexuality differences are reviewed with a discussion on how relationships can change when diagnosed with pulmonary disease. Common sexual concerns are reviewed.   Knowledge Questionnaire Score:     Knowledge Questionnaire Score - 07/30/16 1452      Knowledge Questionnaire Score   Pre Score 12/13      Core Components/Risk Factors/Patient Goals at Admission:     Personal Goals and Risk Factors at Admission - 07/21/16 1041      Core Components/Risk Factors/Patient Goals on Admission    Weight Management Obesity   Increase Strength and Stamina Yes   Improve shortness of breath with ADL's Yes      Core Components/Risk Factors/Patient Goals Review:      Goals and Risk Factor Review    Row Name 07/21/16 1042 08/11/16 1558           Core Components/Risk  Factors/Patient Goals Review   Personal Goals Review Weight Management/Obesity;Improve shortness of breath with ADL's;Increase Strength and Stamina Weight Management/Obesity;Improve shortness of breath with ADL's;Increase Strength and Stamina      Review  - see comment section on ITP      Expected Outcomes  - see Admission expected outcomes         Core Components/Risk Factors/Patient Goals at Discharge (Final Review):      Goals and Risk Factor Review - 08/11/16 1558      Core Components/Risk Factors/Patient Goals Review   Personal Goals Review Weight Management/Obesity;Improve shortness of breath with ADL's;Increase Strength and Stamina   Review see comment section on ITP   Expected Outcomes see Admission expected outcomes      ITP Comments:   Comments: ITP REVIEW Pt is making expected progress toward pulmonary rehab goals after completing 5 sessions. Recommend continued exercise, life style modification, education, and utilization of breathing techniques to increase stamina and strength and decrease shortness of breath with exertion.

## 2016-08-18 ENCOUNTER — Telehealth: Payer: Self-pay | Admitting: Internal Medicine

## 2016-08-18 ENCOUNTER — Other Ambulatory Visit: Payer: Medicare HMO

## 2016-08-18 ENCOUNTER — Telehealth: Payer: Self-pay | Admitting: Pulmonary Disease

## 2016-08-18 DIAGNOSIS — D696 Thrombocytopenia, unspecified: Secondary | ICD-10-CM

## 2016-08-18 DIAGNOSIS — E7800 Pure hypercholesterolemia, unspecified: Secondary | ICD-10-CM

## 2016-08-18 DIAGNOSIS — E119 Type 2 diabetes mellitus without complications: Secondary | ICD-10-CM

## 2016-08-18 DIAGNOSIS — I10 Essential (primary) hypertension: Secondary | ICD-10-CM | POA: Diagnosis not present

## 2016-08-18 DIAGNOSIS — J441 Chronic obstructive pulmonary disease with (acute) exacerbation: Secondary | ICD-10-CM

## 2016-08-18 LAB — COMPREHENSIVE METABOLIC PANEL
ALT: 26 U/L (ref 6–29)
AST: 41 U/L — ABNORMAL HIGH (ref 10–35)
Albumin: 4.2 g/dL (ref 3.6–5.1)
Alkaline Phosphatase: 51 U/L (ref 33–130)
BUN: 18 mg/dL (ref 7–25)
CO2: 28 mmol/L (ref 20–31)
Calcium: 9.8 mg/dL (ref 8.6–10.4)
Chloride: 104 mmol/L (ref 98–110)
Creat: 0.74 mg/dL (ref 0.60–0.93)
Glucose, Bld: 138 mg/dL — ABNORMAL HIGH (ref 65–99)
Potassium: 4.4 mmol/L (ref 3.5–5.3)
Sodium: 142 mmol/L (ref 135–146)
Total Bilirubin: 0.7 mg/dL (ref 0.2–1.2)
Total Protein: 6.8 g/dL (ref 6.1–8.1)

## 2016-08-18 LAB — CBC WITH DIFFERENTIAL/PLATELET
Basophils Absolute: 50 cells/uL (ref 0–200)
Basophils Relative: 1 %
Eosinophils Absolute: 100 cells/uL (ref 15–500)
Eosinophils Relative: 2 %
HCT: 40.2 % (ref 35.0–45.0)
Hemoglobin: 13.5 g/dL (ref 11.7–15.5)
Lymphocytes Relative: 22 %
Lymphs Abs: 1100 cells/uL (ref 850–3900)
MCH: 29.3 pg (ref 27.0–33.0)
MCHC: 33.6 g/dL (ref 32.0–36.0)
MCV: 87.2 fL (ref 80.0–100.0)
MPV: 9.7 fL (ref 7.5–12.5)
Monocytes Absolute: 250 cells/uL (ref 200–950)
Monocytes Relative: 5 %
Neutro Abs: 3500 cells/uL (ref 1500–7800)
Neutrophils Relative %: 70 %
Platelets: 129 10*3/uL — ABNORMAL LOW (ref 140–400)
RBC: 4.61 MIL/uL (ref 3.80–5.10)
RDW: 15.4 % — ABNORMAL HIGH (ref 11.0–15.0)
WBC: 5 10*3/uL (ref 4.0–10.5)

## 2016-08-18 LAB — LIPID PANEL
Cholesterol: 113 mg/dL (ref <200)
HDL: 51 mg/dL (ref >50)
LDL Cholesterol: 43 mg/dL (ref <100)
Total CHOL/HDL Ratio: 2.2 Ratio (ref <5.0)
Triglycerides: 93 mg/dL (ref <150)
VLDL: 19 mg/dL (ref <30)

## 2016-08-18 NOTE — Telephone Encounter (Signed)
Orders placed, looks like patient did not leave a urine specimen so I will get a urine next time she comes in (next Monday 08/25/16) for med check.

## 2016-08-18 NOTE — Telephone Encounter (Signed)
Pulmonary Rehab closed. Will try back tomorrow.

## 2016-08-18 NOTE — Telephone Encounter (Signed)
Pt is here for labs and no future orders have been placed.

## 2016-08-18 NOTE — Telephone Encounter (Signed)
Per last note: Lipids, CBC, c-met, microalb, A1c  It looks like future orders WERE entered, but when she had a different lab done 2/9 (through pulm), all of these were released, but not done.  Please re-enter labs

## 2016-08-19 ENCOUNTER — Telehealth: Payer: Self-pay | Admitting: Pulmonary Disease

## 2016-08-19 ENCOUNTER — Encounter (HOSPITAL_COMMUNITY)
Admission: RE | Admit: 2016-08-19 | Discharge: 2016-08-19 | Disposition: A | Discharge status: Home / Self Care | Payer: Commercial Managed Care - HMO | Source: Ambulatory Visit | Attending: Family Medicine | Admitting: Family Medicine

## 2016-08-19 VITALS — Wt 236.6 lb

## 2016-08-19 DIAGNOSIS — R0602 Shortness of breath: Secondary | ICD-10-CM

## 2016-08-19 LAB — HEMOGLOBIN A1C
Hgb A1c MFr Bld: 6.1 % — ABNORMAL HIGH (ref <5.7)
Mean Plasma Glucose: 128 mg/dL

## 2016-08-19 NOTE — Telephone Encounter (Signed)
Spoke with Butch Penny with [employer], and made her aware that O2 sats will be faxed to provided number. Fax confirmation has been received. Nothing further needed.

## 2016-08-19 NOTE — Telephone Encounter (Signed)
Ok to order 2 LPM O2 with exertion.  PM

## 2016-08-19 NOTE — Telephone Encounter (Signed)
I had to call pulmonary rehab to clarify if the pt had a walk test. Per Cloyde Reams, this was done on 07/22/16. This information has been added to the pt care coordination note. Order has been placed for oxygen. Nothing further was needed.

## 2016-08-19 NOTE — Progress Notes (Signed)
Daily Session Note  Patient Details  Name: Shannon Schultz MRN: [id number] Date of Birth: 03-12-1946 Referring Provider:   April Manson Pulmonary Rehab Walk Test from 07/22/2016 in [place]  Referring Provider  Dr. Tomi Bamberger      Encounter Date: 08/19/2016  Check In:     Session Check In - 08/19/16 1028      Check-In   Location [facility]   Staff Present Rosebud Poles, RN, BSN;Molly diVincenzo, MS, ACSM RCEP, Exercise Physiologist;Lisa Ysidro Evert, RN;Tessi Eustache Rollene Rotunda, RN, BSN   Supervising physician immediately available to respond to emergencies [facility] immediately available   Physician(s) Dr. Candiss Norse   Medication changes reported     No   Fall or balance concerns reported    No   Warm-up and Cool-down Performed as group-led instruction   Resistance Training Performed Yes   VAD Patient? No     Pain Assessment   Currently in Pain? No/denies   Multiple Pain Sites No      Capillary Blood Glucose: Results for orders placed or performed in visit on 08/18/16 (from the past 24 hour(s))  Lipid panel     Status: None   Collection Time: 08/18/16  1:46 PM  Result Value Ref Range   Cholesterol 113 <200 mg/dL   Triglycerides 93 <150 mg/dL   HDL 51 >50 mg/dL   Total CHOL/HDL Ratio 2.2 <5.0 Ratio   VLDL 19 <30 mg/dL   LDL Cholesterol 43 <100 mg/dL   Narrative   Performed at:  [address]                [place], [place] [address]  CBC with Differential/Platelet     Status: Abnormal   Collection Time: 08/18/16  1:46 PM  Result Value Ref Range   WBC 5.0 4.0 - 10.5 K/uL   RBC 4.61 3.80 - 5.10 MIL/uL   Hemoglobin 13.5 11.7 - 15.5 g/dL   HCT 40.2 35.0 - 45.0 %   MCV 87.2 80.0 - 100.0 fL   MCH 29.3 27.0 - 33.0 pg   MCHC 33.6 32.0 - 36.0 g/dL   RDW 15.4 (H) 11.0 - 15.0 %   Platelets 129 (L) 140 - 400 K/uL   MPV 9.7 7.5 - 12.5 fL   Neutro Abs 3,500 1,500 - 7,800 cells/uL   Lymphs Abs  1,100 850 - 3,900 cells/uL   Monocytes Absolute 250 200 - 950 cells/uL   Eosinophils Absolute 100 15 - 500 cells/uL   Basophils Absolute 50 0 - 200 cells/uL   Neutrophils Relative % 70 %   Lymphocytes Relative 22 %   Monocytes Relative 5 %   Eosinophils Relative 2 %   Basophils Relative 1 %   Smear Review Criteria for review not met    Narrative   Performed at:  [address]                [place], [place] [address] SOURCE: BLD&BLOOD  Comprehensive metabolic panel     Status: Abnormal   Collection Time: 08/18/16  1:46 PM  Result Value Ref Range   Sodium 142 135 - 146 mmol/L   Potassium 4.4 3.5 - 5.3 mmol/L   Chloride 104 98 - 110 mmol/L   CO2 28  20 - 31 mmol/L   Glucose, Bld 138 (H) 65 - 99 mg/dL   BUN 18 7 - 25 mg/dL   Creat 0.74 0.60 - 0.93 mg/dL   Total Bilirubin 0.7 0.2 - 1.2 mg/dL   Alkaline Phosphatase 51 33 - 130 U/L   AST 41 (H) 10 - 35 U/L   ALT 26 6 - 29 U/L   Total Protein 6.8 6.1 - 8.1 g/dL   Albumin 4.2 3.6 - 5.1 g/dL   Calcium 9.8 8.6 - 10.4 mg/dL   Narrative   Performed at:  [address]                [place], [place] [address]  Hemoglobin A1c     Status: Abnormal   Collection Time: 08/18/16  1:46 PM  Result Value Ref Range   Hgb A1c MFr Bld 6.1 (H) <5.7 %   Mean Plasma Glucose 128 mg/dL   Narrative   Performed at:  [address]                [place], [place] [address]        Exercise Prescription Changes - 08/19/16 1206      Response to Exercise   Blood Pressure (Admit) 122/62   Blood Pressure (Exercise) 126/78   Blood Pressure (Exit) 126/74   Heart Rate (Admit) 81 bpm   Heart Rate (Exercise) 106 bpm   Heart Rate (Exit) 88 bpm   Oxygen Saturation (Admit) 97 %   Oxygen Saturation (Exercise) 94 %   Oxygen Saturation (Exit) 95 %   Rating of Perceived Exertion (Exercise) 13   Perceived Dyspnea  (Exercise) 2   Duration Progress to 45 minutes of aerobic exercise without signs/symptoms of physical distress   Intensity THRR unchanged     Progression   Progression Continue to progress workloads to maintain intensity without signs/symptoms of physical distress.     Resistance Training   Training Prescription Yes   Weight orange bands'   Reps 10-12  10 minutes of strength training     Oxygen   Oxygen Continuous   Liters 2     Recumbant Bike   Level 2   Minutes 17     NuStep   Level 2   Minutes 17   METs 1.7     Track   Laps 13   Minutes 17     Goals Met:  Improved SOB with ADL's Using PLB without cueing & demonstrates good technique Exercise tolerated well No report of cardiac concerns or symptoms Strength training completed today  Goals Unmet:  Not Applicable  Comments: Service time is from 1030 to 1205   Dr. Rush Farmer is Medical Director for Pulmonary Rehab at [facility].

## 2016-08-19 NOTE — Telephone Encounter (Signed)
PM pulm rehab called and stated that the pt has been using 2 liters while exercising and will need this set up for her at home.  Please advise. thanks

## 2016-08-21 ENCOUNTER — Other Ambulatory Visit: Payer: Self-pay | Admitting: Acute Care

## 2016-08-21 ENCOUNTER — Encounter (HOSPITAL_COMMUNITY)
Admission: RE | Admit: 2016-08-21 | Discharge: 2016-08-21 | Disposition: A | Discharge status: Home / Self Care | Payer: Commercial Managed Care - HMO | Source: Ambulatory Visit | Attending: Family Medicine | Admitting: Family Medicine

## 2016-08-21 VITALS — Wt 238.3 lb

## 2016-08-21 DIAGNOSIS — R0602 Shortness of breath: Secondary | ICD-10-CM

## 2016-08-21 DIAGNOSIS — Z87891 Personal history of nicotine dependence: Secondary | ICD-10-CM

## 2016-08-21 NOTE — Progress Notes (Signed)
Daily Session Note  Patient Details  Name: Shannon Schultz MRN: [id number] Date of Birth: [birth year] Referring Provider:   April Manson Pulmonary Rehab Walk Test from 07/22/2016 in [place]  Referring Provider  Dr. Tomi Bamberger      Encounter Date: 08/21/2016  Check In:     Session Check In - 08/21/16 1102      Check-In   Location [facility]   Staff Present Su Hilt, MS, ACSM RCEP, Exercise Physiologist;Portia Rollene Rotunda, RN, Roque Cash, RN   Supervising physician immediately available to respond to emergencies [facility] immediately available   Physician(s) Dr. Tana Coast   Medication changes reported     No   Fall or balance concerns reported    No   Warm-up and Cool-down Performed as group-led instruction   Resistance Training Performed Yes   VAD Patient? No     Pain Assessment   Currently in Pain? No/denies   Multiple Pain Sites No      Capillary Blood Glucose: No results found for this or any previous visit (from the past 24 hour(s)).     POCT Glucose - 08/21/16 1242      POCT Blood Glucose   Pre-Exercise 197 mg/dL   Post-Exercise 97 mg/dL  Pt given a bananna after execise         Exercise Prescription Changes - 08/21/16 1200      Response to Exercise   Blood Pressure (Admit) 124/66   Blood Pressure (Exercise) 160/80   Blood Pressure (Exit) 130/64   Heart Rate (Admit) 76 bpm   Heart Rate (Exercise) 111 bpm   Heart Rate (Exit) 92 bpm   Oxygen Saturation (Admit) 96 %   Oxygen Saturation (Exercise) 89 %   Oxygen Saturation (Exit) 96 %   Rating of Perceived Exertion (Exercise) 14   Perceived Dyspnea (Exercise) 2   Duration Progress to 45 minutes of aerobic exercise without signs/symptoms of physical distress   Intensity THRR unchanged     Progression   Progression Continue to progress workloads to maintain intensity without signs/symptoms of physical distress.     Resistance Training   Training  Prescription Yes   Weight orange bands   Reps 10-12  10 minutes of strength training     Oxygen   Oxygen Continuous   Liters 2     Recumbant Bike   Level 2   Minutes 17     Track   Laps 14   Minutes 17     Goals Met:  Exercise tolerated well No report of cardiac concerns or symptoms Strength training completed today  Goals Unmet:  Not Applicable  Comments: Service time is from 1030 to 1215    Dr. Rush Farmer is Medical Director for Pulmonary Rehab at [facility].

## 2016-08-22 NOTE — Progress Notes (Signed)
Chief Complaint  Patient presents with  . Diabetes    nonfasting med check.    Diabetes: Blood sugars at home are running 102-160 (once), mostly 125 range.  This morning it was 126.  Denies hypoglycemia. Denies polydipsia and polyuria. Last eye exam was February 2017; doesn't have appointment scheduled yet. She has cataracts, no new vision changes. Patient follows a low sugar diet and checks feet regularly without concerns. Denies numbness, tingling.   Takes metformin TID--this seems to be the most tolerable way to take it. On pulmonary rehab days, she doesn't take it prior to rehab, takes 2 when she gets home, and has some loose stool intermittently.  Hyperlipidemia follow-up: Patient is reportedly following a low-fat, low cholesterol diet. Compliant with medications and denies medication side effects.   Hypertension: Blood pressure is checked at pulmonary rehab, has been normal.  No longer checking at home.  Denies headaches, dizziness, chest pain. Denies side effects of medications. Compliant with medication. She hasn't taken her BP meds yet today.  COPD:  She is seeing pulmonary, meds adjusted (now taking Trelegy Ellipta once daily), and is also in pulmonary rehab.  Requires oxygen prn with exertion.  Helping with sister, who broke her femur.  So, hasn't been able to go to weight watchers--food choices not as good (eating on the run), and eating a little more.  Sister will be moving in with her next week.  The oxygen tanks she got for home are too large--she will be exercising at a gym, and these aren't portable for her.  Trying to get portable oxygen to use at the gym.  Allergies:  Started flaring some.  Needs refill on flonase.  PMH, PSH, SH reviewed  Outpatient Encounter Prescriptions as of 08/25/2016  Medication Sig  . aspirin EC 81 MG tablet Take 81 mg by mouth daily.    . bisoprolol-hydrochlorothiazide (ZIAC) 10-6.25 MG tablet TAKE 1 TABLET EVERY DAY  . Calcium  Carbonate-Vitamin D (CALCIUM 600+D) 600-400 MG-UNIT per tablet Take 1 tablet by mouth daily.   . cetirizine (KLS ALLER-TEC) 10 MG tablet Take 10 mg by mouth daily.  . Cholecalciferol (VITAMIN D) 2000 UNITS tablet Take 4,000 Units by mouth daily.  . fluticasone (FLONASE) 50 MCG/ACT nasal spray Place 2 sprays into both nostrils daily.  . Fluticasone-Umeclidin-Vilant (TRELEGY ELLIPTA) 100-62.5-25 MCG/INH AEPB Inhale 1 puff into the lungs daily.  . metFORMIN (GLUCOPHAGE-XR) 500 MG 24 hr tablet TAKE 1 TABLET (500 MG TOTAL) BY MOUTH 3 (THREE) TIMES DAILY.  . Multiple Vitamins-Minerals (CENTRUM SILVER PO) Take 1 tablet by mouth.    Marland Kitchen PROAIR HFA 108 (90 Base) MCG/ACT inhaler INHALE 2 PUFFS INTO THE LUNGS EVERY 6 (SIX) HOURS AS NEEDED FOR WHEEZING.  . simvastatin (ZOCOR) 20 MG tablet TAKE 1 TABLET AT BEDTIME  . TRUEPLUS LANCETS 33G MISC TEST ONE TIME DAILY  . valACYclovir (VALTREX) 500 MG tablet TAKE 1 TABLET EVERY DAY  . acetaminophen (TYLENOL) 325 MG tablet Take 650 mg by mouth every 6 (six) hours as needed for mild pain, fever or headache. Reported on 10/08/2015  . albuterol (PROVENTIL) (2.5 MG/3ML) 0.083% nebulizer solution USE 1 VIAL BY NEBULIZATION EVERY FOUR HOURS AS NEEDED FOR WHEEZING OR SHORTNESS OF BREATH. (Patient not taking: Reported on 08/25/2016)  . furosemide (LASIX) 20 MG tablet TAKE 1 TABLET BY MOUTH ONLY IF NEEDED FOR SWELLING (Patient not taking: Reported on 08/25/2016)  . guaiFENesin (MUCINEX) 600 MG 12 hr tablet Take 600 mg by mouth 2 (two) times daily.  . [  DISCONTINUED] neomycin-polymyxin-hydrocortisone (CORTISPORIN) otic solution Place 4 drops into the left ear 4 (four) times daily. Use for 7-10 days  . [DISCONTINUED] SYMBICORT 160-4.5 MCG/ACT inhaler INHALE 2 PUFFS INTO THE LUNGS 2 (TWO) TIMES DAILY.   No facility-administered encounter medications on file as of 08/25/2016.    Allergies  Allergen Reactions  . Lisinopril Shortness Of Breath and Rash  . Contrast Media [Iodinated  Diagnostic Agents] Other (See Comments)    Could not breath  . Iohexol Other (See Comments)    Immediately could not breathe  . Latex   . Levofloxacin Other (See Comments)    insomnia  . Sulfa Antibiotics Other (See Comments)    Historical from mother, pt states that mother says she almost died from this drug  . Codeine Nausea Only, Anxiety and Other (See Comments)    insomnia  . Lipitor [Atorvastatin Calcium] Rash    ROS: Allergies started flaring recently, some hoarseness.  No fever, chills, headaches, dizziness, chest pain; intermittent loose stools related to metformin. No other GI or GU complaints.  No bleeding, bruising, rash. +stressors at home, moods are good. See HPI  PHYSICAL EXAM:  BP 138/70 (BP Location: Left Arm, Patient Position: Sitting, Cuff Size: Normal)   Pulse 72   Ht 5' 4.5" (1.638 m)   Wt 237 lb 6.4 oz (107.7 kg)   LMP  (LMP Unknown)   BMI 40.12 kg/m   136/64 on repeat Well appearing, pleasant female, intermittently tearful in discussing sister and stressors, but immediately rebounds, with full range of affect. Neck: no lymphadenopathy, thyromegaly or carotid bruit Heart: regular rate and rhythm, murmur at RUSB Lungs: somewhat distant breath sounds, clear otherwise Abdomen: soft, nontender. Large, firm bulge on right side with cough/muscle contraction. Easily reducible, nontender. WHSS at umbilicus Extremities: discolored (hyperpigmented), no edema, normal pulses and sensation  Lab Results  Component Value Date   HGBA1C 6.1 (H) 08/18/2016     Chemistry      Component Value Date/Time   NA 142 08/18/2016 1346   K 4.4 08/18/2016 1346   CL 104 08/18/2016 1346   CO2 28 08/18/2016 1346   BUN 18 08/18/2016 1346   CREATININE 0.74 08/18/2016 1346      Component Value Date/Time   CALCIUM 9.8 08/18/2016 1346   ALKPHOS 51 08/18/2016 1346   AST 41 (H) 08/18/2016 1346   ALT 26 08/18/2016 1346   BILITOT 0.7 08/18/2016 1346     Fasting glucose  138  Lab Results  Component Value Date   CHOL 113 08/18/2016   HDL 51 08/18/2016   LDLCALC 43 08/18/2016   TRIG 93 08/18/2016   CHOLHDL 2.2 08/18/2016   Lab Results  Component Value Date   WBC 5.0 08/18/2016   HGB 13.5 08/18/2016   HCT 40.2 08/18/2016   MCV 87.2 08/18/2016   PLT 129 (L) 08/18/2016    ASSESSMENT/PLAN:  Essential hypertension, benign - controlled; daily weight loss, low sodium diet and weight loss discussed  Type 2 diabetes mellitus without complication, without long-term current use of insulin (HCC) - controlled; discussed diet, weight loss, exercise. continue current meds  Pure hypercholesterolemia - at goal on current regimen  COPD without exacerbation ([place]) - improved; continue pulmonary rehab. Discussed how to exercise at home, while caring for sister; await portable tanks for gym  Morbid obesity, unspecified obesity type [facility]) - discussed risks of obesity; encouraged continued [employer] and exercise; Consider meds vs bariatric consult if not able to lose  Chronic non-seasonal allergic rhinitis, unspecified  trigger - Plan: fluticasone (FLONASE) 50 MCG/ACT nasal spray  Diabetes mellitus without complication ([place]) - Plan: Microalbumin/Creatinine Ratio, Urine  Abdominal hernia without obstruction and without gangrene, recurrence not specified, unspecified hernia type - Plan: Ambulatory referral to General Surgery    F/u 6 months AWV/CPE/med check Fasting labs prior (TSH, A1c, c-met)  Refer to [facility]--abdominal hernia on right. (prefers not to see Dr. Ninfa Linden, feels like he missed treating hernia in the past)   We are referring you for surgical consult for evaluation of the hernia.  In the interim, I recommend using an abdominal binder when exercising, and continuing weight loss attempts.

## 2016-08-25 ENCOUNTER — Encounter: Payer: Self-pay | Admitting: Family Medicine

## 2016-08-25 ENCOUNTER — Ambulatory Visit (INDEPENDENT_AMBULATORY_CARE_PROVIDER_SITE_OTHER): Payer: Medicare HMO | Admitting: Family Medicine

## 2016-08-25 ENCOUNTER — Telehealth: Payer: Self-pay | Admitting: Pulmonary Disease

## 2016-08-25 VITALS — BP 138/70 | HR 72 | Ht 64.5 in | Wt 237.4 lb

## 2016-08-25 DIAGNOSIS — K469 Unspecified abdominal hernia without obstruction or gangrene: Secondary | ICD-10-CM | POA: Diagnosis not present

## 2016-08-25 DIAGNOSIS — J3089 Other allergic rhinitis: Secondary | ICD-10-CM | POA: Diagnosis not present

## 2016-08-25 DIAGNOSIS — E7800 Pure hypercholesterolemia, unspecified: Secondary | ICD-10-CM | POA: Diagnosis not present

## 2016-08-25 DIAGNOSIS — I10 Essential (primary) hypertension: Secondary | ICD-10-CM

## 2016-08-25 DIAGNOSIS — J449 Chronic obstructive pulmonary disease, unspecified: Secondary | ICD-10-CM

## 2016-08-25 DIAGNOSIS — J441 Chronic obstructive pulmonary disease with (acute) exacerbation: Secondary | ICD-10-CM

## 2016-08-25 DIAGNOSIS — E119 Type 2 diabetes mellitus without complications: Secondary | ICD-10-CM | POA: Diagnosis not present

## 2016-08-25 MED ORDER — FLUTICASONE PROPIONATE 50 MCG/ACT NA SUSP: 2.0000 | Freq: Every day | NASAL | 3 refills | Status: DC

## 2016-08-25 NOTE — Telephone Encounter (Signed)
lmtcb x1 for pt. 

## 2016-08-25 NOTE — Patient Instructions (Addendum)
Continue your current medications. Work on getting 150 minutes/week of aerobic exercise (in 10-15 minute intervals at home, if that's all you can do while waiting for portable oxygen to get back to the gym).   We are referring you for surgical consult for evaluation of the hernia.  In the interim, I recommend using an abdominal binder when exercising, and continuing weight loss attempts.

## 2016-08-26 ENCOUNTER — Encounter (HOSPITAL_COMMUNITY)
Admission: RE | Admit: 2016-08-26 | Discharge: 2016-08-26 | Disposition: A | Discharge status: Home / Self Care | Payer: Commercial Managed Care - HMO | Source: Ambulatory Visit | Attending: Family Medicine | Admitting: Family Medicine

## 2016-08-26 VITALS — Wt 237.4 lb

## 2016-08-26 DIAGNOSIS — R0602 Shortness of breath: Secondary | ICD-10-CM | POA: Diagnosis not present

## 2016-08-26 LAB — MICROALBUMIN / CREATININE URINE RATIO
Creatinine, Urine: 84 mg/dL (ref 20–320)
Microalb Creat Ratio: 31 mcg/mg creat — ABNORMAL HIGH (ref <30)
Microalb, Ur: 2.6 mg/dL

## 2016-08-26 NOTE — Telephone Encounter (Signed)
Order for POC has been placed. Pt aware and voiced her understanding. Nothing further needed.

## 2016-08-26 NOTE — Progress Notes (Signed)
Daily Session Note  Patient Details  Name: Shannon Schultz MRN: [id number] Date of Birth: 1945/11/30 Referring Provider:   April Manson Pulmonary Rehab Walk Test from 07/22/2016 in [place]  Referring Provider  Dr. Tomi Bamberger      Encounter Date: 08/26/2016  Check In:     Session Check In - 08/26/16 1014      Check-In   Location [facility]   Staff Present Rosebud Poles, RN, BSN;Molly diVincenzo, MS, ACSM RCEP, Exercise Physiologist;Lisa Ysidro Evert, RN;Shaelynn Dragos Rollene Rotunda, RN, BSN   Supervising physician immediately available to respond to emergencies [facility] immediately available   Physician(s) Dr. Posey Pronto   Medication changes reported     No   Fall or balance concerns reported    No   Tobacco Cessation No Change   Warm-up and Cool-down Performed as group-led instruction   Resistance Training Performed Yes   VAD Patient? No     Pain Assessment   Currently in Pain? No/denies   Multiple Pain Sites No      Capillary Blood Glucose: No results found for this or any previous visit (from the past 24 hour(s)).     POCT Glucose - 08/26/16 1243      POCT Blood Glucose   Pre-Exercise 200 mg/dL   Post-Exercise 118 mg/dL         Exercise Prescription Changes - 08/26/16 1239      Response to Exercise   Blood Pressure (Admit) 106/60   Blood Pressure (Exercise) 150/78   Blood Pressure (Exit) 122/60   Heart Rate (Admit) 71 bpm   Heart Rate (Exercise) 97 bpm   Heart Rate (Exit) 82 bpm   Oxygen Saturation (Admit) 93 %   Oxygen Saturation (Exercise) 95 %   Oxygen Saturation (Exit) 82 %   Rating of Perceived Exertion (Exercise) 13   Perceived Dyspnea (Exercise) 1   Duration Progress to 45 minutes of aerobic exercise without signs/symptoms of physical distress   Intensity THRR unchanged     Progression   Progression Continue to progress workloads to maintain intensity without signs/symptoms of physical distress.     Resistance  Training   Training Prescription Yes   Weight orange bands   Reps 10-15   Time 10 Minutes     Oxygen   Oxygen Continuous   Liters 2     Recumbant Bike   Level 2   Minutes 17     NuStep   Level 3   Minutes 17   METs 1.8     Track   Laps 12   Minutes 17      History  Smoking Status  . Former Smoker  . Packs/day: 1.00  . Years: 46.00  . Types: Cigarettes  . Quit date: 05/31/2011  Smokeless Tobacco  . Never Used    Goals Met:  Improved SOB with ADL's Using PLB without cueing & demonstrates good technique Exercise tolerated well No report of cardiac concerns or symptoms Strength training completed today  Goals Unmet:  Not Applicable  Comments: Service time is from 1030 to 1205   Dr. Rush Farmer is Medical Director for Pulmonary Rehab at [facility].

## 2016-08-28 ENCOUNTER — Encounter (HOSPITAL_COMMUNITY)
Admission: RE | Admit: 2016-08-28 | Discharge: 2016-08-28 | Disposition: A | Discharge status: Home / Self Care | Payer: Medicare HMO | Source: Ambulatory Visit | Attending: Family Medicine | Admitting: Family Medicine

## 2016-08-28 ENCOUNTER — Telehealth: Payer: Self-pay | Admitting: Pulmonary Disease

## 2016-08-28 DIAGNOSIS — R0602 Shortness of breath: Secondary | ICD-10-CM

## 2016-08-28 NOTE — Telephone Encounter (Signed)
Spoke with pt. States that [place] told her that we ordered her oxygen for 24/7. I reviewed the order we placed and it only states with exertion. I attempted to contact [employer]. There was no answer and I could not leave a message. Will try back.

## 2016-08-28 NOTE — Progress Notes (Signed)
Daily Session Note  Patient Details  Name: Shannon Schultz MRN: [id number] Date of Birth: [birth year] Referring Provider:   April Manson Pulmonary Rehab Walk Test from 07/22/2016 in [place]  Referring Provider  Dr. Tomi Bamberger      Encounter Date: 08/28/2016  Check In:     Session Check In - 08/28/16 1030      Check-In   Location [facility]   Staff Present Rosebud Poles, RN, BSN;Cha Gomillion, MS, ACSM RCEP, Exercise Physiologist;Lisa Ysidro Evert, RN;Portia Rollene Rotunda, RN, BSN   Supervising physician immediately available to respond to emergencies [facility] immediately available   Physician(s) Dr. Sloan Leiter   Medication changes reported     No   Fall or balance concerns reported    No   Tobacco Cessation No Change   Warm-up and Cool-down Performed as group-led instruction   Resistance Training Performed Yes   VAD Patient? No     Pain Assessment   Currently in Pain? No/denies   Multiple Pain Sites No      Capillary Blood Glucose: No results found for this or any previous visit (from the past 24 hour(s)).      Exercise Prescription Changes - 08/28/16 1200      Response to Exercise   Blood Pressure (Admit) 120/66   Blood Pressure (Exercise) 150/78   Blood Pressure (Exit) 120/68   Heart Rate (Admit) 74 bpm   Heart Rate (Exercise) 100 bpm   Heart Rate (Exit) 77 bpm   Oxygen Saturation (Admit) 93 %   Oxygen Saturation (Exercise) 93 %   Oxygen Saturation (Exit) 95 %   Rating of Perceived Exertion (Exercise) 11   Perceived Dyspnea (Exercise) 1   Symptoms 1   Duration Progress to 45 minutes of aerobic exercise without signs/symptoms of physical distress   Intensity THRR unchanged     Progression   Progression Continue to progress workloads to maintain intensity without signs/symptoms of physical distress.     Resistance Training   Training Prescription Yes   Weight orange bands   Reps 10-15   Time 10 Minutes     Oxygen    Oxygen Continuous   Liters 2     Recumbant Bike   Level 2   Minutes 17     NuStep   Level 4   Minutes 17      History  Smoking Status  . Former Smoker  . Packs/day: 1.00  . Years: 46.00  . Types: Cigarettes  . Quit date: 05/31/2011  Smokeless Tobacco  . Never Used    Goals Met:  Exercise tolerated well No report of cardiac concerns or symptoms Strength training completed today  Goals Unmet:  Not Applicable  Comments: Service time is from 10:30a to 12:20p    Dr. Rush Farmer is Medical Director for Pulmonary Rehab at [facility].

## 2016-08-29 NOTE — Telephone Encounter (Signed)
Spoke with [facility] and they stated that they will fix the order. Spoke with pt and made her aware. She had no further questions. Nothing further is needed

## 2016-09-02 ENCOUNTER — Encounter (HOSPITAL_COMMUNITY)
Admission: RE | Admit: 2016-09-02 | Discharge: 2016-09-02 | Disposition: A | Discharge status: Home / Self Care | Payer: Medicare HMO | Source: Ambulatory Visit | Attending: Family Medicine | Admitting: Family Medicine

## 2016-09-02 VITALS — Wt 235.5 lb

## 2016-09-02 DIAGNOSIS — R0602 Shortness of breath: Secondary | ICD-10-CM | POA: Diagnosis not present

## 2016-09-02 DIAGNOSIS — J441 Chronic obstructive pulmonary disease with (acute) exacerbation: Secondary | ICD-10-CM | POA: Diagnosis not present

## 2016-09-02 DIAGNOSIS — J449 Chronic obstructive pulmonary disease, unspecified: Secondary | ICD-10-CM | POA: Diagnosis not present

## 2016-09-02 NOTE — Progress Notes (Signed)
Daily Session Note  Patient Details  Name: Shannon Schultz MRN: [id number] Date of Birth: 11/08/1945 Referring Provider:   Flowsheet Row Pulmonary Rehab Walk Test from 07/22/2016 in [facility]  Referring Provider  Dr. Knapp      Encounter Date: 09/02/2016  Check In:     Session Check In - 09/02/16 1030      Check-In   Location [facility]   Staff Present Joan Behrens, RN, BSN;Molly diVincenzo, MS, ACSM RCEP, Exercise Physiologist;Lisa Hughes, RN;Portia Payne, RN, BSN   Supervising physician immediately available to respond to emergencies [facility] immediately available   Physician(s) Dr. Singh   Medication changes reported     No   Fall or balance concerns reported    No   Tobacco Cessation No Change   Warm-up and Cool-down Performed as group-led instruction   Resistance Training Performed Yes   VAD Patient? No     Pain Assessment   Currently in Pain? No/denies   Multiple Pain Sites No      Capillary Blood Glucose: No results found for this or any previous visit (from the past 24 hour(s)).     POCT Glucose - 09/02/16 1226      POCT Blood Glucose   Post-Exercise --  gave pt bananna for snack         Exercise Prescription Changes - 09/02/16 1200      Response to Exercise   Blood Pressure (Admit) 112/56   Blood Pressure (Exercise) 126/64   Blood Pressure (Exit) 124/74   Heart Rate (Admit) 74 bpm   Heart Rate (Exercise) 103 bpm   Heart Rate (Exit) 90 bpm   Oxygen Saturation (Admit) 97 %   Oxygen Saturation (Exercise) 91 %   Oxygen Saturation (Exit) 95 %   Rating of Perceived Exertion (Exercise) 13   Perceived Dyspnea (Exercise) 2   Duration Progress to 45 minutes of aerobic exercise without signs/symptoms of physical distress   Intensity THRR unchanged     Progression   Progression Continue to progress workloads to maintain intensity without signs/symptoms of physical distress.     Resistance Training    Training Prescription Yes   Weight orange bands   Reps 10-15   Time 10 Minutes     Oxygen   Oxygen Continuous   Liters 2     Bike   Level 2   Minutes 17     Track   Laps 12   Minutes 17      History  Smoking Status  . Former Smoker  . Packs/day: 1.00  . Years: 46.00  . Types: Cigarettes  . Quit date: 05/31/2011  Smokeless Tobacco  . Never Used    Goals Met:  Exercise tolerated well No report of cardiac concerns or symptoms Strength training completed today  Goals Unmet:  Not Applicable  Comments: Service time is from 1030 to 1155    Dr. Wesam G. Yacoub is Medical Director for Pulmonary Rehab at [facility]. 

## 2016-09-04 ENCOUNTER — Encounter (HOSPITAL_COMMUNITY)
Admission: RE | Admit: 2016-09-04 | Discharge: 2016-09-04 | Disposition: A | Discharge status: Home / Self Care | Payer: Medicare HMO | Source: Ambulatory Visit | Attending: Family Medicine | Admitting: Family Medicine

## 2016-09-04 VITALS — Wt 235.9 lb

## 2016-09-04 DIAGNOSIS — R0602 Shortness of breath: Secondary | ICD-10-CM

## 2016-09-04 NOTE — Progress Notes (Signed)
Daily Session Note  Patient Details  Name: Shannon Schultz MRN: [id number] Date of Birth: 10-May-1946 Referring Provider:   April Manson Pulmonary Rehab Walk Test from 07/22/2016 in [place]  Referring Provider  Dr. Tomi Bamberger      Encounter Date: 09/04/2016  Check In:     Session Check In - 09/04/16 1030      Check-In   Location [facility]   Staff Present Rosebud Poles, RN, BSN;Janki Dike, MS, ACSM RCEP, Exercise Physiologist;Portia Rollene Rotunda, RN, BSN   Supervising physician immediately available to respond to emergencies [facility] immediately available   Physician(s) Dr. Tana Coast   Medication changes reported     No   Fall or balance concerns reported    No   Tobacco Cessation No Change   Warm-up and Cool-down Performed as group-led instruction   Resistance Training Performed Yes   VAD Patient? No     Pain Assessment   Currently in Pain? No/denies   Multiple Pain Sites No      Capillary Blood Glucose: No results found for this or any previous visit (from the past 24 hour(s)).     POCT Glucose - 09/04/16 1241      POCT Blood Glucose   Pre-Exercise 150 mg/dL   Post-Exercise 121 mg/dL         Exercise Prescription Changes - 09/04/16 1200      Response to Exercise   Blood Pressure (Admit) 110/60   Blood Pressure (Exercise) 116/70   Blood Pressure (Exit) 100/64   Heart Rate (Admit) 102 bpm   Heart Rate (Exercise) 103 bpm   Heart Rate (Exit) 96 bpm   Oxygen Saturation (Admit) 95 %   Oxygen Saturation (Exercise) 91 %   Oxygen Saturation (Exit) 95 %   Rating of Perceived Exertion (Exercise) 13   Perceived Dyspnea (Exercise) 1   Duration Progress to 45 minutes of aerobic exercise without signs/symptoms of physical distress   Intensity THRR unchanged     Progression   Progression Continue to progress workloads to maintain intensity without signs/symptoms of physical distress.     Resistance Training   Training  Prescription Yes   Weight orange bands   Reps 10-15   Time 10 Minutes     Oxygen   Oxygen Continuous   Liters 2     Bike   Level 2   Minutes 17     Track   Laps 11   Minutes 17      History  Smoking Status  . Former Smoker  . Packs/day: 1.00  . Years: 46.00  . Types: Cigarettes  . Quit date: 05/31/2011  Smokeless Tobacco  . Never Used    Goals Met:  Exercise tolerated well No report of cardiac concerns or symptoms Strength training completed today  Goals Unmet:  Not Applicable  Comments: Service time is from 10:30a to 12:40p    Dr. Rush Farmer is Medical Director for Pulmonary Rehab at [facility].

## 2016-09-04 NOTE — Progress Notes (Signed)
While exercising at Pulmonary Rehab, Shannon Schultz consistently uses 2 liters of continuous flow oxygen. This is provided by Pulmonary Rehab [facility]. At home, the patient currently uses a POC for their home oxygen system. While exercising at home, the patient was instructed to use 2lp liters. In Pulmonary Rehab today, Shannon Schultz walked the track utilizing their own home oxygen system. They used 3 liters pulsed which maintained their oxygen saturation above 89%.

## 2016-09-09 ENCOUNTER — Encounter (HOSPITAL_COMMUNITY)
Admission: RE | Admit: 2016-09-09 | Discharge: 2016-09-09 | Disposition: A | Discharge status: Home / Self Care | Payer: Medicare HMO | Source: Ambulatory Visit | Attending: Family Medicine | Admitting: Family Medicine

## 2016-09-09 ENCOUNTER — Telehealth (HOSPITAL_COMMUNITY): Payer: Self-pay | Admitting: Family Medicine

## 2016-09-09 ENCOUNTER — Ambulatory Visit: Payer: Commercial Managed Care - HMO | Admitting: Pulmonary Disease

## 2016-09-09 DIAGNOSIS — R0602 Shortness of breath: Secondary | ICD-10-CM

## 2016-09-10 ENCOUNTER — Other Ambulatory Visit: Payer: Self-pay | Admitting: Family Medicine

## 2016-09-10 ENCOUNTER — Ambulatory Visit (INDEPENDENT_AMBULATORY_CARE_PROVIDER_SITE_OTHER)
Admission: RE | Admit: 2016-09-10 | Discharge: 2016-09-10 | Disposition: A | Discharge status: Home / Self Care | Payer: Medicare HMO | Source: Ambulatory Visit | Attending: Acute Care | Admitting: Acute Care

## 2016-09-10 ENCOUNTER — Ambulatory Visit (INDEPENDENT_AMBULATORY_CARE_PROVIDER_SITE_OTHER): Payer: Medicare HMO | Admitting: Acute Care

## 2016-09-10 ENCOUNTER — Encounter: Payer: Self-pay | Admitting: Acute Care

## 2016-09-10 DIAGNOSIS — Z87891 Personal history of nicotine dependence: Secondary | ICD-10-CM

## 2016-09-10 NOTE — Progress Notes (Signed)
Shared Decision Making Visit Lung Cancer Screening Program [phone])   Eligibility:  Age 71 y.o.  Pack Years Smoking History Calculation 45 pack years (# packs/per year x # years smoked)  Recent History of coughing up blood  no  Unexplained weight loss? no ( >Than 15 pounds within the last 6 months )  Prior History Lung / other cancer no (Diagnosis within the last 5 years already requiring surveillance chest CT Scans).  Smoking Status Former Smoker  Former Smokers: Years since quit: 6 years  Quit Date: 05/2011  Visit Components:  Discussion included one or more decision making aids. yes  Discussion included risk/benefits of screening. yes  Discussion included potential follow up diagnostic testing for abnormal scans. yes  Discussion included meaning and risk of over diagnosis. yes  Discussion included meaning and risk of False Positives. yes  Discussion included meaning of total radiation exposure. yes  Counseling Included:  Importance of adherence to annual lung cancer LDCT screening. yes  Impact of comorbidities on ability to participate in the program. yes  Ability and willingness to under diagnostic treatment. yes  Smoking Cessation Counseling:  Current Smokers:   Discussed importance of smoking cessation. yes  Information about tobacco cessation classes and interventions provided to patient. yes  Patient provided with "ticket" for LDCT Scan. yes  Symptomatic Patient. no  Counseling  Diagnosis Code: Tobacco Use Z72.0  Asymptomatic Patient yes  Counseling (Intermediate counseling: > three minutes counseling) [id number]  Former Smokers:   Discussed the importance of maintaining cigarette abstinence. yes  Diagnosis Code: Personal History of Nicotine Dependence. O03.704  Information about tobacco cessation classes and interventions provided to patient. Yes  Patient provided with "ticket" for LDCT Scan. yes  Written Order for Lung Cancer Screening  with LDCT placed in Epic. Yes (CT Chest Lung Cancer Screening Low Dose W/O CM) [id number] Z12.2-Screening of respiratory organs Z87.891-Personal history of nicotine dependence  I spent 25 minutes of face to face time with Ms. Borba  discussing the risks and benefits of lung cancer screening. We viewed a power point together that explained in detail the above noted topics. We took the time to pause the power point at intervals to allow for questions to be asked and answered to ensure understanding. We discussed that she had taken the single most powerful action possible to decrease her risk of developing lung cancer when she quit smoking. I counseled her to remain smoke free, and to contact me if she ever had the desire to smoke again so that I can provide resources and tools to help support the effort to remain smoke free. We discussed the time and location of the scan, and that either Doroteo Glassman, RN or I will call with the results within  24-48 hours of receiving them. She has my card and contact information in the event she needs to speak with me, in addition to a copy of the power point we reviewed as a resource. She verbalized understanding of all of the above and had no further questions upon leaving the office.   I spent 2-3 minutes re-inforcing the importance of smoking abstinence  I explained that there is a high incidence of CAD being noted on these exams. Ms. Bubel is on a statin and is followed by a cardiologist. She has her cholesterol checked on a regular basis.   Magdalen Spatz, NP 09/10/2016

## 2016-09-11 ENCOUNTER — Other Ambulatory Visit: Payer: Self-pay | Admitting: Acute Care

## 2016-09-11 ENCOUNTER — Telehealth: Payer: Self-pay | Admitting: Acute Care

## 2016-09-11 ENCOUNTER — Encounter (HOSPITAL_COMMUNITY): Admission: RE | Admit: 2016-09-11 | Payer: Medicare HMO | Source: Ambulatory Visit

## 2016-09-11 DIAGNOSIS — Z87891 Personal history of nicotine dependence: Secondary | ICD-10-CM

## 2016-09-12 ENCOUNTER — Telehealth: Payer: Self-pay | Admitting: Acute Care

## 2016-09-12 NOTE — Telephone Encounter (Signed)
Opal Sidles from Radiology called report for the CT Chest LCS done on 09/10/16. The results showed: Lung RADS category 1 S negative and the S modifier represents clinical significance of non pulmonary findings. She stated to continue annual screening with low dose chest ct without contrast in 12 months. Nothing further is needed.

## 2016-09-12 NOTE — Telephone Encounter (Signed)
Shannon Schultz was this opened in error?  Nothing in this phone message. thanks

## 2016-09-14 ENCOUNTER — Encounter: Payer: Self-pay | Admitting: Family Medicine

## 2016-09-14 DIAGNOSIS — I700 Atherosclerosis of aorta: Secondary | ICD-10-CM | POA: Insufficient documentation

## 2016-09-14 DIAGNOSIS — I2510 Atherosclerotic heart disease of native coronary artery without angina pectoris: Secondary | ICD-10-CM | POA: Insufficient documentation

## 2016-09-16 ENCOUNTER — Encounter (HOSPITAL_COMMUNITY): Payer: Medicare HMO

## 2016-09-17 ENCOUNTER — Other Ambulatory Visit: Payer: Self-pay | Admitting: Family Medicine

## 2016-09-17 DIAGNOSIS — E119 Type 2 diabetes mellitus without complications: Secondary | ICD-10-CM

## 2016-09-18 ENCOUNTER — Ambulatory Visit: Payer: Commercial Managed Care - HMO | Admitting: Pulmonary Disease

## 2016-09-18 ENCOUNTER — Encounter (HOSPITAL_COMMUNITY): Payer: Medicare HMO

## 2016-09-19 ENCOUNTER — Encounter (HOSPITAL_COMMUNITY): Payer: Self-pay

## 2016-09-19 DIAGNOSIS — R0602 Shortness of breath: Secondary | ICD-10-CM

## 2016-09-19 NOTE — Progress Notes (Signed)
Discharge Summary  Patient Details  Name: Shannon Schultz MRN: [id number] Date of Birth: 1945-09-09 Referring Provider:     Pulmonary Rehab Walk Test from 07/22/2016 in [place]  Referring Provider  Dr. Tomi Bamberger       Number of Visits: 12  Reason for Discharge:  Early Exit:  Caregiver role for sister  Smoking History:  History  Smoking Status  . Former Smoker  . Packs/day: 1.00  . Years: 46.00  . Types: Cigarettes  . Quit date: 05/31/2011  Smokeless Tobacco  . Never Used    Diagnosis:  Shortness of breath  ADL UCSD:     Pulmonary Assessment Scores    Row Name 07/30/16 1452         ADL UCSD   ADL Phase Entry     SOB Score total 56        Initial Exercise Prescription:     Initial Exercise Prescription - 07/22/16 1600      Date of Initial Exercise RX and Referring Provider   Date 07/22/16   Referring Provider Dr. Tomi Bamberger     Oxygen   Oxygen Continuous   Liters 2     Recumbant Bike   Level 2   Minutes 17   METs 1.5     NuStep   Level 2   Minutes 17   METs 1.5     Track   Laps 5   Minutes 17     Prescription Details   Frequency (times per week) 2   Duration Progress to 45 minutes of aerobic exercise without signs/symptoms of physical distress     Intensity   THRR 40-80% of Max Heartrate 60-120   Ratings of Perceived Exertion 11-13   Perceived Dyspnea 0-4     Progression   Progression Continue progressive overload as per policy without signs/symptoms or physical distress.     Resistance Training   Training Prescription Yes   Weight ORANGE BANDS   Reps 10-12      Discharge Exercise Prescription (Final Exercise Prescription Changes):     Exercise Prescription Changes - 09/04/16 1200      Response to Exercise   Blood Pressure (Admit) 110/60   Blood Pressure (Exercise) 116/70   Blood Pressure (Exit) 100/64   Heart Rate (Admit) 102 bpm   Heart Rate (Exercise) 103 bpm   Heart Rate (Exit) 96 bpm    Oxygen Saturation (Admit) 95 %   Oxygen Saturation (Exercise) 91 %   Oxygen Saturation (Exit) 95 %   Rating of Perceived Exertion (Exercise) 13   Perceived Dyspnea (Exercise) 1   Duration Progress to 45 minutes of aerobic exercise without signs/symptoms of physical distress   Intensity THRR unchanged     Progression   Progression Continue to progress workloads to maintain intensity without signs/symptoms of physical distress.     Resistance Training   Training Prescription Yes   Weight orange bands   Reps 10-15   Time 10 Minutes     Oxygen   Oxygen Continuous   Liters 2     Bike   Level 2   Minutes 17     Track   Laps 11   Minutes 17      Functional Capacity:     6 Minute Walk    Row Name 07/22/16 1623         6 Minute Walk   Phase Initial     Distance 810 feet     Walk  Time 6 minutes     # of Rest Breaks 1  1 minute 11 seconds     MPH 1.53     METS 2.15     RPE 13     Perceived Dyspnea  3     Symptoms No     Resting HR 92 bpm     Resting BP 128/70     Max Ex. HR 120 bpm     Max Ex. BP 142/80       Interval HR   Baseline HR 92     1 Minute HR 102     2 Minute HR 117     3 Minute HR 120     4 Minute HR 119     5 Minute HR 117     6 Minute HR 109     2 Minute Post HR 103     Interval Heart Rate? Yes       Interval Oxygen   Interval Oxygen? Yes     Baseline Oxygen Saturation % 92 %     Baseline Liters of Oxygen 0 L     1 Minute Oxygen Saturation % 89 %     1 Minute Liters of Oxygen 0 L     2 Minute Oxygen Saturation % 84 %     2 Minute Liters of Oxygen 0 L     3 Minute Oxygen Saturation % 81 %     3 Minute Liters of Oxygen 0 L     4 Minute Oxygen Saturation % 89 %     4 Minute Liters of Oxygen 2 L     5 Minute Oxygen Saturation % 88 %     5 Minute Liters of Oxygen 2 L     6 Minute Oxygen Saturation % 86 %     6 Minute Liters of Oxygen 2 L     2 Minute Post Oxygen Saturation % 92 %     2 Minute Post Liters of Oxygen 2 L         Psychological, QOL, Others - Outcomes: PHQ 2/9: Depression screen [facility] 2/9 07/21/2016 02/14/2016 01/10/2015 07/06/2013  Decreased Interest 0 0 0 0  Down, Depressed, Hopeless 0 0 0 0  PHQ - 2 Score 0 0 0 0    Quality of Life:     Quality of Life - 07/30/16 1453      Quality of Life Scores   Health/Function Pre 18 %   Socioeconomic Pre 22 %   Psych/Spiritual Pre 19.71 %   Family Pre 25.5 %   GLOBAL Pre 20.23 %      Personal Goals: Goals established at orientation with interventions provided to work toward goal.     Personal Goals and Risk Factors at Admission - 07/21/16 1041      Core Components/Risk Factors/Patient Goals on Admission    Weight Management Obesity   Increase Strength and Stamina Yes   Improve shortness of breath with ADL's Yes       Personal Goals Discharge:     Goals and Risk Factor Review    Row Name 07/21/16 1042 08/11/16 1558 09/08/16 1031         Core Components/Risk Factors/Patient Goals Review   Personal Goals Review Weight Management/Obesity;Improve shortness of breath with ADL's;Increase Strength and Stamina Weight Management/Obesity;Improve shortness of breath with ADL's;Increase Strength and Stamina Weight Management/Obesity;Develop more efficient breathing techniques such as purse lipped breathing and diaphragmatic breathing and practicing self-pacing with  activity.;Improve shortness of breath with ADL's     Review  - see comment section on ITP see comment section on ITP     Expected Outcomes  - see Admission expected outcomes see Admission expected outcomes        Nutrition & Weight - Outcomes:     Pre Biometrics - 07/21/16 1046      Pre Biometrics   Grip Strength 35 kg       Nutrition:     Nutrition Therapy & Goals - 07/30/16 1659      Nutrition Therapy   Diet Therapeutic Lifestyle Changes     Personal Nutrition Goals   Nutrition Goal 1-2 lb wt loss/week to a wt loss goal of 6-24 lb at graduation from [facility], educate and counsel regarding individualized specific dietary modifications aiming towards targeted core components such as weight, hypertension, lipid management, diabetes, heart failure and other comorbidities.   Expected Outcomes Short Term Goal: Understand basic principles of dietary content, such as calories, fat, sodium, cholesterol and nutrients.;Long Term Goal: Adherence to prescribed nutrition plan.      Nutrition Discharge:     Nutrition Assessments - 07/30/16 1659      MEDFICTS Scores   Pre Score 55      Education Questionnaire Score:     Knowledge Questionnaire Score - 07/30/16 1452      Knowledge Questionnaire Score   Pre Score 12/13     Shannon Schultz has been discharged per her request unexpectedly after 12 pulmonary rehab sessions. To her sadness she is the primary caregiver for her sister who has recently transitioned to in home hospice care for the diagnosis of cancer. Shannon Schultz states she has promised her dying sister that she would eventually return to pulmonary rehab to better her own health. Shannon Schultz was offered emotional support during this difficult time. She was reassured that she would be welcomed back to the program when she felt she was ready.

## 2016-09-23 ENCOUNTER — Encounter (HOSPITAL_COMMUNITY): Payer: Medicare HMO

## 2016-09-24 ENCOUNTER — Ambulatory Visit (INDEPENDENT_AMBULATORY_CARE_PROVIDER_SITE_OTHER): Payer: Medicare HMO | Admitting: Pulmonary Disease

## 2016-09-24 ENCOUNTER — Encounter: Payer: Self-pay | Admitting: Pulmonary Disease

## 2016-09-24 VITALS — BP 128/70 | HR 94 | Ht 64.0 in | Wt 239.0 lb

## 2016-09-24 DIAGNOSIS — Z87891 Personal history of nicotine dependence: Secondary | ICD-10-CM | POA: Diagnosis not present

## 2016-09-24 DIAGNOSIS — J449 Chronic obstructive pulmonary disease, unspecified: Secondary | ICD-10-CM

## 2016-09-24 DIAGNOSIS — J441 Chronic obstructive pulmonary disease with (acute) exacerbation: Secondary | ICD-10-CM | POA: Diagnosis not present

## 2016-09-24 LAB — PULMONARY FUNCTION TEST
DL/VA % pred: 58 %
DL/VA: 2.81 ml/min/mmHg/L
DLCO cor % pred: 54 %
DLCO cor: 13.25 ml/min/mmHg
DLCO unc % pred: 57 %
DLCO unc: 13.93 ml/min/mmHg
FEF 25-75 Post: 0.68 L/sec
FEF 25-75 Pre: 0.51 L/sec
FEF2575-%Change-Post: 35 %
FEF2575-%Pred-Post: 36 %
FEF2575-%Pred-Pre: 26 %
FEV1-%Change-Post: 8 %
FEV1-%Pred-Post: 63 %
FEV1-%Pred-Pre: 58 %
FEV1-Post: 1.43 L
FEV1-Pre: 1.32 L
FEV1FVC-%Change-Post: 4 %
FEV1FVC-%Pred-Pre: 72 %
FEV6-%Change-Post: 5 %
FEV6-%Pred-Post: 83 %
FEV6-%Pred-Pre: 79 %
FEV6-Post: 2.39 L
FEV6-Pre: 2.25 L
FEV6FVC-%Change-Post: 2 %
FEV6FVC-%Pred-Post: 101 %
FEV6FVC-%Pred-Pre: 98 %
FVC-%Change-Post: 2 %
FVC-%Pred-Post: 82 %
FVC-%Pred-Pre: 79 %
FVC-Post: 2.45 L
FVC-Pre: 2.38 L
Post FEV1/FVC ratio: 58 %
Post FEV6/FVC ratio: 97 %
Pre FEV1/FVC ratio: 55 %
Pre FEV6/FVC Ratio: 95 %
RV % pred: 108 %
RV: 2.38 L
TLC % pred: 99 %
TLC: 5 L

## 2016-09-24 NOTE — Progress Notes (Signed)
PFT done today. 

## 2016-09-24 NOTE — Progress Notes (Signed)
Shannon Schultz    [id number]    August 27, 1945  Primary Care Physician:KNAPP,EVE A, MD  Referring Physician: [name], MD [address] [place], [place] [address]  Chief complaint:  Follow up for COPD GOLD B  HPI: Shannon Schultz is a 71 year old with COPD, moderate obstructive defect. She was previously seen by Dr. Melvyn Novas in 2013. She has been maintained on Symbicort since 2013. She was hospitalized in February 2017 for the flu and COPD exacerbation.  She is currently in pulmonary rehabilitation. She reports worsening dyspnea on exertion for the past year. She's noticed this more over the past few months. She denies any dyspnea at rest, cough, sputum production, wheezing.  Interim History: At last visit Symbicort was held and she was started on trelegy inhaler. She feels this has helped a lot with her symptoms however she has occasional hoarseness of voice. She is rinsing her mouth after every use She continues on pulmonary rehabilitation and is on 2 L supplemental oxygen with ambulation. She lost her sister recently to breast cancer and is still grieving.  Outpatient Encounter Prescriptions as of 09/24/2016  Medication Sig  . acetaminophen (TYLENOL) 325 MG tablet Take 650 mg by mouth every 6 (six) hours as needed for mild pain, fever or headache. Reported on 10/08/2015  . albuterol (PROVENTIL) (2.5 MG/3ML) 0.083% nebulizer solution USE 1 VIAL BY NEBULIZATION EVERY FOUR HOURS AS NEEDED FOR WHEEZING OR SHORTNESS OF BREATH.  Marland Kitchen aspirin EC 81 MG tablet Take 81 mg by mouth daily.    . bisoprolol-hydrochlorothiazide (ZIAC) 10-6.25 MG tablet TAKE 1 TABLET EVERY DAY  . Calcium Carbonate-Vitamin D (CALCIUM 600+D) 600-400 MG-UNIT per tablet Take 1 tablet by mouth daily.   . cetirizine (KLS ALLER-TEC) 10 MG tablet Take 10 mg by mouth daily.  . Cholecalciferol (VITAMIN D) 2000 UNITS tablet Take 4,000 Units by mouth daily.  . fluticasone (FLONASE) 50 MCG/ACT nasal spray Place 2 sprays into  both nostrils daily.  . Fluticasone-Umeclidin-Vilant (TRELEGY ELLIPTA) 100-62.5-25 MCG/INH AEPB Inhale 1 puff into the lungs daily.  . furosemide (LASIX) 20 MG tablet TAKE 1 TABLET BY MOUTH ONLY IF NEEDED FOR SWELLING  . guaiFENesin (MUCINEX) 600 MG 12 hr tablet Take 600 mg by mouth 2 (two) times daily.  . metFORMIN (GLUCOPHAGE-XR) 500 MG 24 hr tablet TAKE 1 TABLET THREE TIMES DAILY  . Multiple Vitamins-Minerals (CENTRUM SILVER PO) Take 1 tablet by mouth.    Marland Kitchen PROAIR HFA 108 (90 Base) MCG/ACT inhaler INHALE 2 PUFFS INTO THE LUNGS EVERY 6 (SIX) HOURS AS NEEDED FOR WHEEZING.  . simvastatin (ZOCOR) 20 MG tablet TAKE 1 TABLET AT BEDTIME  . TRUEPLUS LANCETS 33G MISC TEST ONE TIME DAILY  . valACYclovir (VALTREX) 500 MG tablet TAKE 1 TABLET EVERY DAY   No facility-administered encounter medications on file as of 09/24/2016.     Allergies as of 09/24/2016 - Review Complete 09/24/2016  Allergen Reaction Noted  . Lisinopril Shortness Of Breath and Rash 05/15/2014  . Contrast media [iodinated diagnostic agents] Other (See Comments) 06/06/2011  . Iohexol Other (See Comments) 06/25/2009  . Latex  06/25/2011  . Levofloxacin Other (See Comments) 11/11/2010  . Sulfa antibiotics Other (See Comments) 11/11/2010  . Codeine Nausea Only, Anxiety, and Other (See Comments) 11/11/2010  . Lipitor [atorvastatin calcium] Rash 11/11/2010    Past Medical History:  Diagnosis Date  . Allergy   . 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 Colon polyp   . COPD (  chronic obstructive pulmonary disease) ([place])   . Diverticulosis   . Elevated cholesterol   . Elevated liver enzymes    fatty liver per ultrasound per pt  . Family history of malignant neoplasm of breast   . FHx: BRCA2 gene positive    sister with BRCA2 mutation (pt tested NEGATIVE)  . Genital HSV    gets on hip  . Heart murmur     echo 05/2011 mild aortic stenosis  . HSV (herpes simplex virus) infection    on hip--on daily suppression  . Hypertension     . Impaired glucose tolerance   . Migraine   . Osteopenia   . Pneumonia 06/06/2011  . Type 2 diabetes mellitus ([place])   . Vitamin D deficiency disease     Past Surgical History:  Procedure Laterality Date  . CHOLECYSTECTOMY  2003  . COLONOSCOPY  2009, 08/2010, 12/2015   Dr. Collene Mares  . HERNIA REPAIR  5003   umbilical  . MASTECTOMY Right 1992   R breast for cancer    Family History  Problem Relation Age of Onset  . Heart disease Father   . Diabetes Father   . Hypertension Father   . Asthma Sister   . Allergies Sister   . Hyperlipidemia Sister   . Hashimoto's thyroiditis Sister   . Heart disease Mother     tachycardia  . Asthma Sister   . Allergies Sister   . Hyperlipidemia Sister   . Hashimoto's thyroiditis Sister   . Breast cancer Sister 66    BRCA2 positive; metastatic to bones at age 72  . Cancer Other     female cancers, bone cancer  . Cancer Maternal Grandmother     deceased 14; unk. primary; possibly stomach  . Tuberculosis Maternal Grandfather   . Stroke Paternal Grandmother 55    died of cerebral hemorrhage  . Breast cancer Other     Social History   Social History  . Marital status: Married    Spouse name: N/A  . Number of children: 3  . Years of education: N/A   Occupational History  . Retired    Social History Main Topics  . Smoking status: Former Smoker    Packs/day: 1.00    Years: 46.00    Types: Cigarettes    Quit date: 05/31/2011  . Smokeless tobacco: Never Used  . Alcohol use No     Comment: rarely, 1 drink per year maybe  . Drug use: No  . Sexual activity: Yes    Partners: Male    Birth control/ protection: Post-menopausal   Other Topics Concern  . Not on file   Social History Narrative   Lives with her husband.  Children all live in [place] nearby. No pets. 5 grandchildren.   Sister moving in with her 08/2016 (after hip fracture)    Review of systems: Review of Systems  Constitutional: Negative for fever and chills.  HENT: Negative.    Eyes: Negative for blurred vision.  Respiratory: as per HPI  Cardiovascular: Negative for chest pain and palpitations.  Gastrointestinal: Negative for vomiting, diarrhea, blood per rectum. Genitourinary: Negative for dysuria, urgency, frequency and hematuria.  Musculoskeletal: Negative for myalgias, back pain and joint pain.  Skin: Negative for itching and rash.  Neurological: Negative for dizziness, tremors, focal weakness, seizures and loss of consciousness.  Endo/Heme/Allergies: Negative for environmental allergies.  Psychiatric/Behavioral: Negative for depression, suicidal ideas and hallucinations.  All other systems reviewed and are negative.  Physical Exam: Blood pressure  128/70, pulse 94, height _0  (1.626 m), weight 239 lb (108.4 kg), SpO2 93 %. Gen:      No acute distress HEENT:  EOMI, sclera anicteric Neck:     No masses; no thyromegaly Lungs:    Clear to auscultation bilaterally; normal respiratory effort CV:         Regular rate and rhythm; no murmurs Abd:      + bowel sounds; soft, non-tender; no palpable masses, no distension Ext:    No edema; adequate peripheral perfusion Skin:      Warm and dry; no rash Neuro: alert and oriented x 3 Psych: normal mood and affect  Data Reviewed: Chest x-ray 08/16/15-hyperinflation consistent with COPD. No acute pulmonary abnormality. Chest x-ray 9//16/16-no acute cardio pulmonary abnormality Screening CT chest 09/10/16 -moderate emphysema with no suspicious lung nodule or mass. All images personally reviewed  PFTs 08/28/11 FVC 2.71 [92%] FEV1 1.33 (62%) F/F 49 TLC 114% DLCO 46% Moderate obstructive defect with moderate reduction in diffusion capacity. No bronchodilator response  11/08/15 FVC 2.05 [7%) FEV1 1.23 (55%) F/F 60 Moderate obstructive defect  09/24/16 FVC 2. (82%) FEV1 1.43 (62%) F/F 58  TLC 99% DLCO 54% Moderate obstruction with moderate diffusion defect. No bronchodilator response.  A1AT 08/08/16- 162,  PIMM  Assessment:  COPD GOLD B She has responded well to the Trilogy inhaler but has hoarseness. We'll continue the same for now but if her hoarseness persist then we will need to consider alternate inhaler. A1AT levels are normal and screening CT is ok.   She will continue on pulmonary rehab and continue supplemental O2 with ambulation.  Plan/Recommendations: - Continue trelegy - Pulmonary rehab, supplemental o2 - Continue screening CT of chest.   Marshell Garfinkel MD [facility] Pager [phone] 09/24/2016, 1:40 PM  CC: [name], MD

## 2016-09-24 NOTE — Patient Instructions (Signed)
Continue using her inhalers. Work in resuming your pulmonary rehabilitation as soon as possible Continue oxygen during exertion. Return to clinic in 3 months.

## 2016-09-25 ENCOUNTER — Telehealth: Payer: Self-pay | Admitting: Pulmonary Disease

## 2016-09-25 ENCOUNTER — Encounter (HOSPITAL_COMMUNITY): Payer: Medicare HMO

## 2016-09-25 NOTE — Telephone Encounter (Signed)
Spoke with pt she stated [employer] contacted her and said that they were going to bring a tank to her house that an order was placed for this. Pt was upset because she had an ov yesterday with PM and he did not mention this. Checked chart no order was placed. Contacted [employer] and they stated that they were trying to bring a tank as a back up in case something happened to her POC. They stated they will call the pt and explain this to her. Nothing further is needed.

## 2016-09-29 ENCOUNTER — Encounter (HOSPITAL_COMMUNITY): Payer: Self-pay

## 2016-09-29 ENCOUNTER — Encounter: Payer: Self-pay | Admitting: Family Medicine

## 2016-09-29 ENCOUNTER — Ambulatory Visit (INDEPENDENT_AMBULATORY_CARE_PROVIDER_SITE_OTHER): Payer: Medicare HMO | Admitting: Family Medicine

## 2016-09-29 ENCOUNTER — Encounter (HOSPITAL_COMMUNITY)
Admission: RE | Admit: 2016-09-29 | Discharge: 2016-09-29 | Disposition: A | Discharge status: Home / Self Care | Payer: Medicare HMO | Source: Ambulatory Visit | Attending: Family Medicine | Admitting: Family Medicine

## 2016-09-29 VITALS — BP 128/70 | HR 80 | Ht 64.0 in | Wt 240.6 lb

## 2016-09-29 VITALS — BP 150/64 | HR 81 | Resp 18 | Ht 64.5 in | Wt 241.0 lb

## 2016-09-29 DIAGNOSIS — R0602 Shortness of breath: Secondary | ICD-10-CM | POA: Diagnosis not present

## 2016-09-29 DIAGNOSIS — M7989 Other specified soft tissue disorders: Secondary | ICD-10-CM

## 2016-09-29 NOTE — Patient Instructions (Signed)
  If no blood clot is found, then I recommend taking furosemide once daily for 1-2 days, just until the swelling in both feet/legs improves. Be sure to eat a banana while taking the furosemide. Continue to try and follow a low sodium diet (cut out the salted nuts, ham).  You can increase the ibuprofen to 3-4 tablets (600-800mg ) every 8 hours. Be sure to take it with food, and cut back on the dose if it bothers your stomach.  This should help with any inflammation at the knee.

## 2016-09-29 NOTE — Progress Notes (Signed)
Shannon Schultz 71 y.o. female Pulmonary Rehab Orientation Note Patient arrived today in Cardiac and Pulmonary Rehab for orientation to Pulmonary Rehab. She ambulated from the [facility] entrance parking deck. This is a readmission for her as she was discharged 3 weeks ago for personal reasons. She was caring for her sister in Hospice and anticipated her sister living longer than she did. She does carry portable oxygen via portable concentrator for exertion only. Color good, skin warm and dry. Patient is oriented to time and place. Patient's medical history, psychosocial health, and medications reviewed. Psychosocial assessment reveals pt lives with their spouse. Pt is currently a retired Optometrist. Pt hobbies include reading and spending time with her grandchildren. Pt reports her stress level is generally low, however she is morning the loss of her sister.  Pt does not exhibit signs of depression. PHQ2/9 score 0/na. Pt shows good  coping skills with positive outlook. She is offered emotional support and reassurance. Physical assessment reveals heart rate is normal, breath sounds clear to auscultation, no wheezes, rales, or rhonchi. Grip strength equal, strong. Distal pulses palpable. Moderate non pitting edema noted to left lower leg and ankle/foot. Patient states she thinks she pulled a muscle in the back of her knee/upper calf when she bent over. Remains painful upon assessment. Concerned for bloodclot. Patient has appointment today at 1145 with primary card. Patient reports she does take medications as prescribed. Patient states she follows a Diabetic diet. The patient reports no specific efforts to gain or lose weight.. Patient's weight will be monitored closely. Demonstration and practice of PLB using pulse oximeter. Patient able to return demonstration satisfactorily. Safety and hand hygiene in the exercise area reviewed with patient. Patient voices understanding of the information reviewed. Department  expectations discussed with patient and achievable goals were set. The patient shows enthusiasm about attending the program and we look forward to working with this nice lady. The patient is scheduled for a 6 min walk test on 10/07/16 and will begin to exercise on that day.      45 minutes was spent on a variety of activities such as assessment of the patient, obtaining baseline data including height, weight, BMI, and grip strength, verifying medical history, allergies, and current medications, and teaching patient strategies for performing tasks with less respiratory effort with emphasis on pursed lip breathing.

## 2016-09-29 NOTE — Progress Notes (Signed)
Chief Complaint  Patient presents with  . Edema    swelling in right knee down to her ankle and foot. Left swollen also but not as bad.     Pulled behind the right leg when she bent forward in her laundry room, without bending her knees.  Felt something pull behind her right knee.  This was about a week ago. Two days ago she noticed right foot swelling.  She feels like her knee and right leg has been tight since she bent like that.  She has some ankle swelling on the left as well, noted yesterday, a little more than what she considers normal.  No pain in the left leg or knee.  Has been eating some salted peanuts (Thurs and Fri), had Honeybaked Ham yesterday (after swelling already noted).  Denies long car rides or inactivity.  Saturday she was very active with her grandchildren.  She dropped out of pulmonary rehab to take care of her sister. She was signing back up again today, and when asked about swelling, mentioned the pain and swelling in the RLE and she was told to come in to be evaluated.  PMH, PSH, SH reviewed  Outpatient Encounter Prescriptions as of 09/29/2016  Medication Sig  . aspirin EC 81 MG tablet Take 81 mg by mouth daily.    . bisoprolol-hydrochlorothiazide (ZIAC) 10-6.25 MG tablet TAKE 1 TABLET EVERY DAY  . Calcium Carbonate-Vitamin D (CALCIUM 600+D) 600-400 MG-UNIT per tablet Take 1 tablet by mouth daily.   . cetirizine (KLS ALLER-TEC) 10 MG tablet Take 10 mg by mouth daily.  . Cholecalciferol (VITAMIN D) 2000 UNITS tablet Take 4,000 Units by mouth daily.  . fluticasone (FLONASE) 50 MCG/ACT nasal spray Place 2 sprays into both nostrils daily.  . Fluticasone-Umeclidin-Vilant (TRELEGY ELLIPTA) 100-62.5-25 MCG/INH AEPB Inhale 1 puff into the lungs daily.  . metFORMIN (GLUCOPHAGE-XR) 500 MG 24 hr tablet TAKE 1 TABLET THREE TIMES DAILY  . Multiple Vitamins-Minerals (CENTRUM SILVER PO) Take 1 tablet by mouth.    . simvastatin (ZOCOR) 20 MG tablet TAKE 1 TABLET AT BEDTIME  .  TRUEPLUS LANCETS 33G MISC TEST ONE TIME DAILY  . valACYclovir (VALTREX) 500 MG tablet TAKE 1 TABLET EVERY DAY  . acetaminophen (TYLENOL) 325 MG tablet Take 650 mg by mouth every 6 (six) hours as needed for mild pain, fever or headache. Reported on 10/08/2015  . albuterol (PROVENTIL) (2.5 MG/3ML) 0.083% nebulizer solution USE 1 VIAL BY NEBULIZATION EVERY FOUR HOURS AS NEEDED FOR WHEEZING OR SHORTNESS OF BREATH. (Patient not taking: Reported on 09/29/2016)  . furosemide (LASIX) 20 MG tablet TAKE 1 TABLET BY MOUTH ONLY IF NEEDED FOR SWELLING (Patient not taking: Reported on 09/29/2016)  . guaiFENesin (MUCINEX) 600 MG 12 hr tablet Take 600 mg by mouth 2 (two) times daily.  Marland Kitchen PROAIR HFA 108 (90 Base) MCG/ACT inhaler INHALE 2 PUFFS INTO THE LUNGS EVERY 6 (SIX) HOURS AS NEEDED FOR WHEEZING. (Patient not taking: Reported on 09/29/2016)   No facility-administered encounter medications on file as of 09/29/2016.    She has taken 2 ibuprofen twice daily for the last 3 days.  Allergies  Allergen Reactions  . Lisinopril Shortness Of Breath and Rash  . Contrast Media [Iodinated Diagnostic Agents] Other (See Comments)    Could not breath  . Iohexol Other (See Comments)    Immediately could not breathe  . Latex   . Levofloxacin Other (See Comments)    insomnia  . Sulfa Antibiotics Other (See Comments)    Historical  from mother, pt states that mother says she almost died from this drug  . Codeine Nausea Only, Anxiety and Other (See Comments)    insomnia  . Lipitor [Atorvastatin Calcium] Rash    ROS:  No fever, chills, URI symptoms, cough, change in her breathing.  No headache, dizziness.  No chest pain, palpitations.  No GI or GU complaints.  +right knee pain, leg pain, swelling R>L feet as per HPI.   PHYSICAL EXAM:  BP 128/70 (BP Location: Left Arm, Patient Position: Sitting, Cuff Size: Normal)   Pulse 80   Ht 5\' 4"  (1.626 m)   Wt 240 lb 9.6 oz (109.1 kg)   LMP  (LMP Unknown)   BMI 41.30 kg/m   Wt  Readings from Last 3 Encounters:  09/29/16 240 lb 9.6 oz (109.1 kg)  09/29/16 240 lb 15.4 oz (109.3 kg)  09/24/16 239 lb (108.4 kg)   Well appearing, pleasant female in no distress HEENT: conjunctiva and sclera are clear, OP clear Neck: no lymphadenopathy, thyromegaly or mass Heart: regular rate and rhythm.  Murmur 3/6 loudest at RUSB, unchanged Lungs sounds distant, no wheezes, rales, ronchi Abdomen: obese, soft, nontender, no mass Extremities: 2+ pulses.  Hyperpigmented changes from chronic stasis bilaterally. 1+ pitting edema, trace to 1+ on the left.  Extends more proximally on the right, only pretibial and at ankle on the left.  Tender behind the right knee. No mass or cyst palpable. No asymmetry to popliteal area, nontender on the left.   She has some nontender varicosities noted on the left posterior leg She has prominent varicosities at the right anterior knee and shin--nontender, but much fuller feeling than the varicose veins on the left. Calf circumference: R 47.25, L 45 cm at largest diameter Thigh also seems a little thicker compared to the left.   ASSESSMENT/PLAN:  Leg swelling - R>L, fairly acute onset.  r/o DVT. Bilat component of edema may be related to Na intake--use lasix x 1-2d. Suspect knee OA, contrib to pain - Plan: VAS [place] LOWER EXTREMITY VENOUS (DVT), CANCELED: US Venous Img Lower Unilateral Right  DDX of leg pain and swelling reviewed in detail. Likely multifactorial (given knee pain, bilat edema)  r/o DVT or other injury causing asymmetry to the right leg Painful right knee, popliteal fossa and asymmetric swelling of the RLE more than the left.   If no blood clot is found, then I recommend taking furosemide once daily for 1-2 days, just until the swelling in both feet/legs improves. Be sure to eat a banana while taking the furosemide. Continue to try and follow a low sodium diet (cut out the salted nuts, ham).  You can increase the ibuprofen to 3-4 tablets  (600-800mg ) every 8 hours. Be sure to take it with food, and cut back on the dose if it bothers your stomach.  This should help with any inflammation at the knee.

## 2016-09-30 ENCOUNTER — Ambulatory Visit (HOSPITAL_COMMUNITY)
Admission: RE | Admit: 2016-09-30 | Discharge: 2016-09-30 | Disposition: A | Discharge status: Home / Self Care | Payer: Medicare HMO | Source: Ambulatory Visit | Attending: Family Medicine | Admitting: Family Medicine

## 2016-09-30 ENCOUNTER — Encounter (HOSPITAL_COMMUNITY): Payer: Medicare HMO

## 2016-09-30 ENCOUNTER — Encounter: Payer: Self-pay | Admitting: Family Medicine

## 2016-09-30 DIAGNOSIS — M7989 Other specified soft tissue disorders: Secondary | ICD-10-CM | POA: Insufficient documentation

## 2016-09-30 NOTE — Telephone Encounter (Signed)
Called and notified pt of results, and dose change on meds, she said that the swelling is going down and she will send a mychart message to let you know.

## 2016-09-30 NOTE — Progress Notes (Signed)
VASCULAR LAB PRELIMINARY  PRELIMINARY  PRELIMINARY  PRELIMINARY  Right lower extremity venous duplex completed.    Preliminary report:  Right:  No evidence of DVT, superficial thrombosis, or Baker's cyst.  Desarie Feild, RVS 09/30/2016, 9:34 AM

## 2016-09-30 NOTE — Telephone Encounter (Signed)
The  vascular  Center called  With a call report for her Doppler it was negative , report will be in epic later today.

## 2016-09-30 NOTE — Telephone Encounter (Signed)
Please advise pt no DVT.  Have her send me a MyChart message with how the knee and leg swelling is doing with the higher dose ibuprofen and the lasix we recommended yesterday.

## 2016-10-02 ENCOUNTER — Encounter (HOSPITAL_COMMUNITY): Payer: Medicare HMO

## 2016-10-02 ENCOUNTER — Other Ambulatory Visit: Payer: Self-pay | Admitting: Family Medicine

## 2016-10-02 DIAGNOSIS — Z1231 Encounter for screening mammogram for malignant neoplasm of breast: Secondary | ICD-10-CM

## 2016-10-03 DIAGNOSIS — J449 Chronic obstructive pulmonary disease, unspecified: Secondary | ICD-10-CM | POA: Diagnosis not present

## 2016-10-03 DIAGNOSIS — J441 Chronic obstructive pulmonary disease with (acute) exacerbation: Secondary | ICD-10-CM | POA: Diagnosis not present

## 2016-10-07 ENCOUNTER — Encounter (HOSPITAL_COMMUNITY): Payer: Medicare HMO

## 2016-10-07 ENCOUNTER — Encounter (HOSPITAL_COMMUNITY)
Admission: RE | Admit: 2016-10-07 | Discharge: 2016-10-07 | Disposition: A | Discharge status: Home / Self Care | Payer: Medicare HMO | Source: Ambulatory Visit | Attending: Family Medicine | Admitting: Family Medicine

## 2016-10-07 DIAGNOSIS — R0602 Shortness of breath: Secondary | ICD-10-CM | POA: Diagnosis not present

## 2016-10-07 NOTE — Progress Notes (Signed)
Pulmonary Individual Treatment Plan  Patient Details  Name: Shannon Schultz MRN: [id number] Date of Birth: 1945-12-06 Referring Provider:     Pulmonary Rehab Walk Test from 10/07/2016 in [facility]  Referring Provider  Dr. Tomi Bamberger      Initial Encounter Date:    Pulmonary Rehab Walk Test from 10/07/2016 in [facility]  Date  10/07/16  Referring Provider  Dr. Tomi Bamberger      Visit Diagnosis: Shortness of breath  Patient's Home Medications on Admission:   Current Outpatient Prescriptions:  .  acetaminophen (TYLENOL) 325 MG tablet, Take 650 mg by mouth every 6 (six) hours as needed for mild pain, fever or headache. Reported on 10/08/2015, Disp: , Rfl:  .  albuterol (PROVENTIL) (2.5 MG/3ML) 0.083% nebulizer solution, USE 1 VIAL BY NEBULIZATION EVERY FOUR HOURS AS NEEDED FOR WHEEZING OR SHORTNESS OF BREATH. (Patient not taking: Reported on 09/29/2016), Disp: 270 mL, Rfl: 0 .  aspirin EC 81 MG tablet, Take 81 mg by mouth daily.  , Disp: , Rfl:  .  bisoprolol-hydrochlorothiazide (ZIAC) 10-6.25 MG tablet, TAKE 1 TABLET EVERY DAY, Disp: 90 tablet, Rfl: 1 .  Calcium Carbonate-Vitamin D (CALCIUM 600+D) 600-400 MG-UNIT per tablet, Take 1 tablet by mouth daily. , Disp: , Rfl:  .  cetirizine (KLS ALLER-TEC) 10 MG tablet, Take 10 mg by mouth daily., Disp: , Rfl:  .  Cholecalciferol (VITAMIN D) 2000 UNITS tablet, Take 4,000 Units by mouth daily., Disp: , Rfl:  .  fluticasone (FLONASE) 50 MCG/ACT nasal spray, Place 2 sprays into both nostrils daily., Disp: 48 g, Rfl: 3 .  Fluticasone-Umeclidin-Vilant (TRELEGY ELLIPTA) 100-62.5-25 MCG/INH AEPB, Inhale 1 puff into the lungs daily., Disp: 180 each, Rfl: 3 .  furosemide (LASIX) 20 MG tablet, TAKE 1 TABLET BY MOUTH ONLY IF NEEDED FOR SWELLING (Patient not taking: Reported on 09/29/2016), Disp: 30 tablet, Rfl: 2 .  guaiFENesin (MUCINEX) 600 MG 12 hr tablet, Take 600 mg by mouth 2 (two) times daily., Disp: ,  Rfl:  .  metFORMIN (GLUCOPHAGE-XR) 500 MG 24 hr tablet, TAKE 1 TABLET THREE TIMES DAILY, Disp: 270 tablet, Rfl: 1 .  Multiple Vitamins-Minerals (CENTRUM SILVER PO), Take 1 tablet by mouth.  , Disp: , Rfl:  .  PROAIR HFA 108 (90 Base) MCG/ACT inhaler, INHALE 2 PUFFS INTO THE LUNGS EVERY 6 (SIX) HOURS AS NEEDED FOR WHEEZING. (Patient not taking: Reported on 09/29/2016), Disp: 54 g, Rfl: 0 .  simvastatin (ZOCOR) 20 MG tablet, TAKE 1 TABLET AT BEDTIME, Disp: 90 tablet, Rfl: 0 .  TRUEPLUS LANCETS 33G MISC, TEST ONE TIME DAILY, Disp: 100 each, Rfl: 3 .  valACYclovir (VALTREX) 500 MG tablet, TAKE 1 TABLET EVERY DAY, Disp: 90 tablet, Rfl: 3  Past Medical History: Past Medical History:  Diagnosis Date  . Allergy   . Cancer ([place]) 1992   R breast, DCIS  . Colon polyp   . COPD (chronic obstructive pulmonary disease) ([place])   . Diverticulosis   . Elevated cholesterol   . Elevated liver enzymes    fatty liver per ultrasound per pt  . Family history of malignant neoplasm of breast   . FHx: BRCA2 gene positive    sister with BRCA2 mutation (pt tested NEGATIVE)  . Genital HSV    gets on hip  . Heart murmur     echo 05/2011 mild aortic stenosis  . HSV (herpes simplex virus) infection    on hip--on daily suppression  . Hypertension   .  Impaired glucose tolerance   . Migraine   . Osteopenia   . Pneumonia 06/06/2011  . Type 2 diabetes mellitus ([place])   . Vitamin D deficiency disease     Tobacco Use: History  Smoking Status  . Former Smoker  . Packs/day: 1.00  . Years: 46.00  . Types: Cigarettes  . Quit date: 05/31/2011  Smokeless Tobacco  . Never Used    Labs: Recent Review Flowsheet Data    Labs for ITP Cardiac and Pulmonary Rehab Latest Ref Rng & Units 01/04/2015 07/19/2015 11/08/2015 02/11/2016 08/18/2016   Cholestrol <200 mg/dL 110 124(L) - - 113   LDLCALC <100 mg/dL 47 57 - - 43   HDL >50 mg/dL 44(L) 50 - - 51   Trlycerides <150 mg/dL 96 84 - - 93   Hemoglobin A1c <5.7 % 6.7(H) 7.2(H)  6.3 5.6 6.1(H)   PHART 7.350 - 7.400 - - - - -   PCO2ART 35.0 - 45.0 mmHg - - - - -   HCO3 20.0 - 24.0 mEq/L - - - - -   TCO2 0 - 100 mmol/L - - - - -   O2SAT % - - - - -      Capillary Blood Glucose: Lab Results  Component Value Date   GLUCAP 304 (H) 08/18/2015   GLUCAP 222 (H) 08/18/2015   GLUCAP 237 (H) 08/17/2015   GLUCAP 167 (H) 08/17/2015   GLUCAP 258 (H) 08/17/2015       POCT Glucose    Row Name 07/29/16 1218 07/31/16 1301 08/05/16 1210 08/12/16 1230 08/19/16 1209     POCT Blood Glucose   Pre-Exercise 196 mg/dL 180 mg/dL 164 mg/dL 189 mg/dL 151 mg/dL   Post-Exercise 108 mg/dL 135 mg/dL 124 mg/dL 126 mg/dL 95 mg/dL  banana   Row Name 08/21/16 1242 08/26/16 1243 09/02/16 1222 09/02/16 1226 09/04/16 1241     POCT Blood Glucose   Pre-Exercise 197 mg/dL 200 mg/dL 161 mg/dL  - 150 mg/dL   Post-Exercise 97 mg/dL  Pt given a bananna after execise 118 mg/dL 102 mg/dL -  gave pt bananna for snack 121 mg/dL      ADL UCSD:   Pulmonary Function Assessment:     Pulmonary Function Assessment - 09/29/16 1018      Breath   Bilateral Breath Sounds Clear   Shortness of Breath Yes;Limiting activity      Exercise Target Goals: Date: 10/07/16  Exercise Program Goal: Individual exercise prescription set with THRR, safety & activity barriers. Participant demonstrates ability to understand and report RPE using BORG scale, to self-measure pulse accurately, and to acknowledge the importance of the exercise prescription.  Exercise Prescription Goal: Starting with aerobic activity 30 plus minutes a day, 3 days per week for initial exercise prescription. Provide home exercise prescription and guidelines that participant acknowledges understanding prior to discharge.  Activity Barriers & Risk Stratification:     Activity Barriers & Cardiac Risk Stratification - 09/29/16 0956      Activity Barriers & Cardiac Risk Stratification   Activity Barriers Shortness of  Breath;Deconditioning      6 Minute Walk:     6 Minute Walk    Row Name 07/22/16 1623 10/07/16 1616       6 Minute Walk   Phase Initial Initial    Distance 810 feet 1155 feet    Walk Time 6 minutes 6 minutes    # of Rest Breaks 1  1 minute 11 seconds 0    MPH 1.53  2.18    METS 2.15 2.61    RPE 13 13    Perceived Dyspnea  3 1    Symptoms No No    Resting HR 92 bpm 68 bpm    Resting BP 128/70 120/56    Max Ex. HR 120 bpm 108 bpm    Max Ex. BP 142/80 140/90    2 Minute Post BP  - 138/82      Interval HR   Baseline HR 92 68    1 Minute HR 102 91    2 Minute HR 117 91    3 Minute HR 120 104    4 Minute HR 119 104    5 Minute HR 117 105    6 Minute HR 109 108    2 Minute Post HR 103 85    Interval Heart Rate? Yes Yes      Interval Oxygen   Interval Oxygen? Yes Yes    Baseline Oxygen Saturation % 92 % 98 %    Baseline Liters of Oxygen 0 L 2 L    1 Minute Oxygen Saturation % 89 % 92 %    1 Minute Liters of Oxygen 0 L 2 L    2 Minute Oxygen Saturation % 84 % 93 %    2 Minute Liters of Oxygen 0 L 2 L    3 Minute Oxygen Saturation % 81 % 88 %    3 Minute Liters of Oxygen 0 L 2 L    4 Minute Oxygen Saturation % 89 % 88 %    4 Minute Liters of Oxygen 2 L 2 L    5 Minute Oxygen Saturation % 88 % 89 %    5 Minute Liters of Oxygen 2 L 2 L    6 Minute Oxygen Saturation % 86 % 89 %    6 Minute Liters of Oxygen 2 L 2 L    2 Minute Post Oxygen Saturation % 92 % 97 %    2 Minute Post Liters of Oxygen 2 L 2 L       Oxygen Initial Assessment:     Oxygen Initial Assessment - 10/07/16 1623      Home Oxygen   Home Oxygen Device Portable Concentrator;Home Concentrator   Sleep Oxygen Prescription Continuous;None   Home Exercise Oxygen Prescription Pulsed   Liters per minute 2   Home at Rest Exercise Oxygen Prescription None   Compliance with Home Oxygen Use Yes     Initial 6 min Walk   Oxygen Used Continuous;E-Tanks   Liters per minute 2   Resting Oxygen Saturation   during 6 min walk 98 %   Exercise Oxygen Saturation  during 6 min walk 88 %     Program Oxygen Prescription   Program Oxygen Prescription Continuous;E-Tanks   Liters per minute 2      Oxygen Re-Evaluation:   Oxygen Discharge (Final Oxygen Re-Evaluation):   Initial Exercise Prescription:     Initial Exercise Prescription - 10/07/16 1600      Date of Initial Exercise RX and Referring Provider   Date 10/07/16   Referring Provider Dr. Tomi Bamberger     Oxygen   Oxygen Continuous   Liters 2     Bike   Level 2   Minutes 17     NuStep   Level 3   Minutes 17   METs 1.5     Track   Laps 10   Minutes 17     Prescription  Details   Frequency (times per week) 2   Duration Progress to 45 minutes of aerobic exercise without signs/symptoms of physical distress     Intensity   THRR 40-80% of Max Heartrate 60-120   Ratings of Perceived Exertion 11-13   Perceived Dyspnea 0-4     Progression   Progression Continue progressive overload as per policy without signs/symptoms or physical distress.     Resistance Training   Training Prescription Yes   Weight orange bands   Reps 10-15      Perform Capillary Blood Glucose checks as needed.  Exercise Prescription Changes:     Exercise Prescription Changes    Row Name 07/29/16 1200 07/31/16 1259 08/05/16 1200 08/07/16 1300 08/12/16 1200     Response to Exercise   Blood Pressure (Admit) 120/70 110/60 126/70 126/68 110/60   Blood Pressure (Exercise) 166/78 144/70 128/66 128/74 160/62   Blood Pressure (Exit) 140/82 110/70 122/60 102/60 100/64   Heart Rate (Admit) 85 bpm 75 bpm 71 bpm 90 bpm 82 bpm   Heart Rate (Exercise) 101 bpm 100 bpm 101 bpm 107 bpm 107 bpm   Heart Rate (Exit) 77 bpm 70 bpm 94 bpm 95 bpm 94 bpm   Oxygen Saturation (Admit) 99 % 92 % 94 % 89 % 93 %   Oxygen Saturation (Exercise) 88 % 94 % 91 % 90 % 93 %   Oxygen Saturation (Exit) 97 % 98 % 94 % 98 % 94 %   Rating of Perceived Exertion (Exercise) _0 Perceived Dyspnea (Exercise) _1 Duration Progress to 45 minutes of aerobic exercise without signs/symptoms of physical distress Progress to 45 minutes of aerobic exercise without signs/symptoms of physical distress Progress to 45 minutes of aerobic exercise without signs/symptoms of physical distress Progress to 45 minutes of aerobic exercise without signs/symptoms of physical distress Progress to 45 minutes of aerobic exercise without signs/symptoms of physical distress   Intensity Other (comment) Other (comment) Other (comment) Other (comment) THRR unchanged     Progression   Progression -  40-80% of HRR -  40-80% of HRR -  40-80% of HRR -  40-80% of HRR Continue to progress workloads to maintain intensity without signs/symptoms of physical distress.     Resistance Training   Training Prescription _2    Weight _3 '   Reps 10-12  10 minutes of strength training 10-12  10 minutes of strength training 10-12  10 minutes of strength training 10-12  10 minutes of strength training 10-12  10 minutes of strength training     Oxygen   Oxygen _4    Liters _5 Recumbant Bike   Level _6 pt felt dizzy for a few minutes 2   Minutes _7 NuStep   Level _8 - 2   Minutes _9 - 17   METs 1.4 1.5 1.6  - 1.7     Track   Laps 8  - _10 Minutes 17  - _11 [place] Name 08/14/16 1200 08/19/16 1206 08/21/16 1200 08/26/16 1239 08/28/16 1200     Response to Exercise   Blood Pressure (Admit) 110/56 122/62 124/66 106/60 120/66   Blood Pressure (Exercise) 158/80  126/78 160/80 150/78 150/78   Blood Pressure (Exit) 128/70 126/74 130/64 122/60 120/68   Heart Rate (Admit) 70 bpm 81 bpm 76 bpm 71 bpm 74 bpm   Heart Rate (Exercise) 104 bpm 106 bpm 111 bpm 97 bpm 100 bpm   Heart Rate (Exit) 79 bpm 88 bpm 92 bpm 82 bpm 77  bpm   Oxygen Saturation (Admit) 95 % 97 % 96 % 93 % 93 %   Oxygen Saturation (Exercise) 93 % 94 % 89 % 95 % 93 %   Oxygen Saturation (Exit) 97 % 95 % 96 % 82 % 95 %   Rating of Perceived Exertion (Exercise) _0 Perceived Dyspnea (Exercise) _1 Symptoms  -  -  -  - 1   Duration Progress to 45 minutes of aerobic exercise without signs/symptoms of physical distress Progress to 45 minutes of aerobic exercise without signs/symptoms of physical distress Progress to 45 minutes of aerobic exercise without signs/symptoms of physical distress Progress to 45 minutes of aerobic exercise without signs/symptoms of physical distress Progress to 45 minutes of aerobic exercise without signs/symptoms of physical distress   Intensity _2      Progression   Progression Continue to progress workloads to maintain intensity without signs/symptoms of physical distress. Continue to progress workloads to maintain intensity without signs/symptoms of physical distress. Continue to progress workloads to maintain intensity without signs/symptoms of physical distress. Continue to progress workloads to maintain intensity without signs/symptoms of physical distress. Continue to progress workloads to maintain intensity without signs/symptoms of physical distress.     Resistance Training   Training Prescription _3    Weight orange bands' orange bands' orange bands orange bands orange bands   Reps 10-12  10 minutes of strength training 10-12  10 minutes of strength training 10-12  10 minutes of strength training 10-15 10-15   Time  -  -  - 10 Minutes 10 Minutes     Oxygen   Oxygen _4    Liters _5 Recumbant Bike   Level _6 Minutes _7 NuStep   Level 2 2  - 3 4   Minutes 17 17  - 17 17   METs 1.7 1.7  - 1.8  -     Track   Laps   - _8 -   Minutes  - _9 -   Row Name 09/02/16 1200 09/04/16 1200           Response to Exercise   Blood Pressure (Admit) 112/56 110/60      Blood Pressure (Exercise) 126/64 116/70      Blood Pressure (Exit) 124/74 100/64      Heart Rate (Admit) 74 bpm 102 bpm      Heart Rate (Exercise) 103 bpm 103 bpm      Heart Rate (Exit) 90 bpm 96 bpm      Oxygen Saturation (Admit) 97 % 95 %      Oxygen Saturation (Exercise) 91 % 91 %      Oxygen Saturation (Exit) 95 % 95 %      Rating of Perceived Exertion (Exercise) 13 13      Perceived Dyspnea (Exercise) 2 1      Duration  Progress to 45 minutes of aerobic exercise without signs/symptoms of physical distress Progress to 45 minutes of aerobic exercise without signs/symptoms of physical distress      Intensity THRR unchanged THRR unchanged        Progression   Progression Continue to progress workloads to maintain intensity without signs/symptoms of physical distress. Continue to progress workloads to maintain intensity without signs/symptoms of physical distress.        Resistance Training   Training Prescription Yes Yes      Weight orange bands orange bands      Reps 10-15 10-15      Time 10 Minutes 10 Minutes        Oxygen   Oxygen Continuous Continuous      Liters 2 2        Bike   Level 2 2      Minutes 17 17        Track   Laps 12 11      Minutes 17 17         Exercise Comments:     Exercise Comments    Row Name 08/11/16 1509 09/04/16 1242         Exercise Comments Patient has only attended 4 sessions. She is on 2 liters of oxygen during exercise which is new to her. She is walking up to 10 laps in 15 minutes. Will cont. to monitor and progress.  Used own POC while on walking track         Exercise Goals and Review:     Exercise Goals    Row Name 09/30/16 1211             Exercise Goals   Increase Physical Activity Yes       Intervention Provide advice, education, support and counseling about  physical activity/exercise needs.;Develop an individualized exercise prescription for aerobic and resistive training based on initial evaluation findings, risk stratification, comorbidities and participant's personal goals.       Expected Outcomes Achievement of increased cardiorespiratory fitness and enhanced flexibility, muscular endurance and strength shown through measurements of functional capacity and personal statement of participant.       Increase Strength and Stamina Yes       Intervention Provide advice, education, support and counseling about physical activity/exercise needs.;Develop an individualized exercise prescription for aerobic and resistive training based on initial evaluation findings, risk stratification, comorbidities and participant's personal goals.       Expected Outcomes Achievement of increased cardiorespiratory fitness and enhanced flexibility, muscular endurance and strength shown through measurements of functional capacity and personal statement of participant.          Exercise Goals Re-Evaluation :     Exercise Goals Re-Evaluation    Row Name 09/09/16 0809             Exercise Goal Re-Evaluation   Exercise Goals Review Increase Strenth and Stamina;Increase Physical Activity       Comments Patient is slowly progressing in program. Very consistent attendance. Up to 14 laps (244fperlap) in 15 minutes. Will cont. to monitor and progress.        Expected Outcomes Through exercise at rehab and home patient will increase physical capacity, stength, and stamina.          Discharge Exercise Prescription (Final Exercise Prescription Changes):     Exercise Prescription Changes - 09/04/16 1200      Response to Exercise   Blood Pressure (Admit) 110/60   Blood Pressure (Exercise)  116/70   Blood Pressure (Exit) 100/64   Heart Rate (Admit) 102 bpm   Heart Rate (Exercise) 103 bpm   Heart Rate (Exit) 96 bpm   Oxygen Saturation (Admit) 95 %   Oxygen Saturation  (Exercise) 91 %   Oxygen Saturation (Exit) 95 %   Rating of Perceived Exertion (Exercise) 13   Perceived Dyspnea (Exercise) 1   Duration Progress to 45 minutes of aerobic exercise without signs/symptoms of physical distress   Intensity THRR unchanged     Progression   Progression Continue to progress workloads to maintain intensity without signs/symptoms of physical distress.     Resistance Training   Training Prescription Yes   Weight orange bands   Reps 10-15   Time 10 Minutes     Oxygen   Oxygen Continuous   Liters 2     Bike   Level 2   Minutes 17     Track   Laps 11   Minutes 17      Nutrition:  Target Goals: Understanding of nutrition guidelines, daily intake of sodium <1561m, cholesterol <2036m calories 30% from fat and 7% or less from saturated fats, daily to have 5 or more servings of fruits and vegetables.  Biometrics:     Pre Biometrics - 09/29/16 0958      Pre Biometrics   Grip Strength 25 kg       Nutrition Therapy Plan and Nutrition Goals:     Nutrition Therapy & Goals - 07/30/16 1659      Nutrition Therapy   Diet Therapeutic Lifestyle Changes     Personal Nutrition Goals   Nutrition Goal 1-2 lb wt loss/week to a wt loss goal of 6-24 lb at graduation from [facility] and counsel regarding individualized specific dietary modifications aiming towards targeted core components such as weight, hypertension, lipid management, diabetes, heart failure and other comorbidities.   Expected Outcomes Short Term Goal: Understand basic principles of dietary content, such as calories, fat, sodium, cholesterol and nutrients.;Long Term Goal: Adherence to prescribed nutrition plan.      Nutrition Discharge: Rate Your Plate Scores:     Nutrition Assessments - 07/30/16 1659      MEDFICTS Scores   Pre Score 55      Nutrition Goals Re-Evaluation:     Nutrition Goals Re-Evaluation    Row  Name 09/17/16 0806             Goals   Current Weight 235 lb 14.3 oz (107 kg)       Nutrition Goal 1-2 lb wt loss/week to a wt loss goal of 6-24 lb at graduation from Pulmonary Rehab       Comment Pt wt is down 4 lb (1.8 kg). Minimum wt loss goal of 6 lb not met.        Expected Outcome Continue diet and lifestyle changes to promote desired wt loss.           Nutrition Goals Discharge (Final Nutrition Goals Re-Evaluation):     Nutrition Goals Re-Evaluation - 09/17/16 0806      Goals   Current Weight 235 lb 14.3 oz (107 kg)   Nutrition Goal 1-2 lb wt loss/week to a wt loss goal of 6-24 lb at graduation from Pulmonary Rehab   Comment Pt wt is down 4 lb (1.8 kg). Minimum wt loss goal of 6 lb not met.    Expected Outcome Continue diet and lifestyle  changes to promote desired wt loss.       Psychosocial: Target Goals: Acknowledge presence or absence of significant depression and/or stress, maximize coping skills, provide positive support system. Participant is able to verbalize types and ability to use techniques and skills needed for reducing stress and depression.  Initial Review & Psychosocial Screening:     Initial Psych Review & Screening - 09/29/16 1026      Initial Review   Current issues with Current Stress Concerns   Source of Stress Concerns Family     Family Dynamics   Good Support System? Yes   Comments recent loss of sister     Barriers   Psychosocial barriers to participate in program The patient should benefit from training in stress management and relaxation.     Screening Interventions   Interventions Encouraged to exercise;Yes   Expected Outcomes Short Term goal: Utilizing psychosocial counselor, staff and physician to assist with identification of specific Stressors or current issues interfering with healing process. Setting desired goal for each stressor or current issue identified.;Long Term Goal: Stressors or current issues are controlled or  eliminated.;Short Term goal: Identification and review with participant of any Quality of Life or Depression concerns found by scoring the questionnaire.;Long Term goal: The participant improves quality of Life and PHQ9 Scores as seen by post scores and/or verbalization of changes      Quality of Life Scores:   PHQ-9: Recent Review Flowsheet Data    Depression screen [facility] 2/9 09/29/2016 07/21/2016 02/14/2016 01/10/2015 07/06/2013   Decreased Interest 0 0 0 0 0   Down, Depressed, Hopeless 0 0 0 0 0   PHQ - 2 Score 0 0 0 0 0     Interpretation of Total Score  Total Score Depression Severity:  1-4 = Minimal depression, 5-9 = Mild depression, 10-14 = Moderate depression, 15-19 = Moderately severe depression, 20-27 = Severe depression   Psychosocial Evaluation and Intervention:     Psychosocial Evaluation - 09/29/16 1029      Psychosocial Evaluation & Interventions   Interventions Encouraged to exercise with the program and follow exercise prescription   Continue Psychosocial Services  Follow up required by staff      Psychosocial Re-Evaluation:     Psychosocial Re-Evaluation    [place] Name 08/11/16 1558 09/08/16 1216           Psychosocial Re-Evaluation   Comments no psychosocial barriers identified since admission  -      Expected Outcomes  - patient will remain free from psychosocial barriers      Interventions Encouraged to attend Pulmonary Rehabilitation for the exercise  -         Psychosocial Discharge (Final Psychosocial Re-Evaluation):     Psychosocial Re-Evaluation - 09/08/16 1216      Psychosocial Re-Evaluation   Expected Outcomes patient will remain free from psychosocial barriers      Education: Education Goals: Education classes will be provided on a weekly basis, covering required topics. Participant will state understanding/return demonstration of topics presented.  Learning Barriers/Preferences:     Learning Barriers/Preferences - 09/29/16 1018       Learning Barriers/Preferences   Learning Barriers None   Learning Preferences Computer/Internet;Group Instruction;Verbal Instruction;Written Material      Education Topics: Risk Factor Reduction:  -Group instruction that is supported by a PowerPoint presentation. Instructor discusses the definition of a risk factor, different risk factors for pulmonary disease, and how the heart and lungs work together.     Nutrition for  Pulmonary Patient:  -Group instruction provided by PowerPoint slides, verbal discussion, and written materials to support subject matter. The instructor gives an explanation and review of healthy diet recommendations, which includes a discussion on weight management, recommendations for fruit and vegetable consumption, as well as protein, fluid, caffeine, fiber, sodium, sugar, and alcohol. Tips for eating when patients are short of breath are discussed.   Pursed Lip Breathing:  -Group instruction that is supported by demonstration and informational handouts. Instructor discusses the benefits of pursed lip and diaphragmatic breathing and detailed demonstration on how to preform both.     Oxygen Safety:  -Group instruction provided by PowerPoint, verbal discussion, and written material to support subject matter. There is an overview of "What is Oxygen" and "Why do we need it".  Instructor also reviews how to create a safe environment for oxygen use, the importance of using oxygen as prescribed, and the risks of noncompliance. There is a brief discussion on traveling with oxygen and resources the patient may utilize.   Oxygen Equipment:  -Group instruction provided by [employer] Staff utilizing handouts, written materials, and equipment demonstrations.   Signs and Symptoms:  -Group instruction provided by written material and verbal discussion to support subject matter. Warning signs and symptoms of infection, stroke, and heart attack are reviewed and when to call the  physician/911 reinforced. Tips for preventing the spread of infection discussed.   PULMONARY REHAB OTHER RESPIRATORY from 09/04/2016 in [place]  Date  08/07/16  Educator  MD  Instruction Review Code  2- meets goals/outcomes      Advanced Directives:  -Group instruction provided by verbal instruction and written material to support subject matter. Instructor reviews Advanced Directive laws and proper instruction for filling out document.   Pulmonary Video:  -Group video education that reviews the importance of medication and oxygen compliance, exercise, good nutrition, pulmonary hygiene, and pursed lip and diaphragmatic breathing for the pulmonary patient.   PULMONARY REHAB OTHER RESPIRATORY from 09/04/2016 in [place]  Date  08/21/16  Instruction Review Code  2- meets goals/outcomes      Exercise for the Pulmonary Patient:  -Group instruction that is supported by a PowerPoint presentation. Instructor discusses benefits of exercise, core components of exercise, frequency, duration, and intensity of an exercise routine, importance of utilizing pulse oximetry during exercise, safety while exercising, and options of places to exercise outside of rehab.     PULMONARY REHAB OTHER RESPIRATORY from 09/04/2016 in [place]  Date  08/14/16  Educator  EP  Instruction Review Code  2- meets goals/outcomes      Pulmonary Medications:  -Verbally interactive group education provided by instructor with focus on inhaled medications and proper administration.   Anatomy and Physiology of the Respiratory System and Intimacy:  -Group instruction provided by PowerPoint, verbal discussion, and written material to support subject matter. Instructor reviews respiratory cycle and anatomical components of the respiratory system and their functions. Instructor also reviews differences in obstructive and restrictive  respiratory diseases with examples of each. Intimacy, Sex, and Sexuality differences are reviewed with a discussion on how relationships can change when diagnosed with pulmonary disease. Common sexual concerns are reviewed.   PULMONARY REHAB OTHER RESPIRATORY from 09/04/2016 in [place]  Date  09/04/16  Educator  RN  Instruction Review Code  2- meets goals/outcomes      Knowledge Questionnaire Score:   Core Components/Risk Factors/Patient Goals at  Admission:     Personal Goals and Risk Factors at Admission - 09/29/16 1025      Core Components/Risk Factors/Patient Goals on Admission    Weight Management Yes;Obesity   Intervention Obesity: Provide education and appropriate resources to help participant work on and attain dietary goals.;Weight Management/Obesity: Establish reasonable short term and long term weight goals.;Weight Management: Develop a combined nutrition and exercise program designed to reach desired caloric intake, while maintaining appropriate intake of nutrient and fiber, sodium and fats, and appropriate energy expenditure required for the weight goal.;Weight Management: Provide education and appropriate resources to help participant work on and attain dietary goals.   Expected Outcomes Short Term: Continue to assess and modify interventions until short term weight is achieved   Improve shortness of breath with ADL's Yes   Intervention Provide education, individualized exercise plan and daily activity instruction to help decrease symptoms of SOB with activities of daily living.   Expected Outcomes Short Term: Achieves a reduction of symptoms when performing activities of daily living.   Develop more efficient breathing techniques such as purse lipped breathing and diaphragmatic breathing; and practicing self-pacing with activity Yes   Intervention Provide education, demonstration and support about specific breathing techniuqes utilized for more  efficient breathing. Include techniques such as pursed lipped breathing, diaphragmatic breathing and self-pacing activity.   Expected Outcomes Short Term: Participant will be able to demonstrate and use breathing techniques as needed throughout daily activities.   Stress Yes  recent loss of sister   Intervention Offer individual and/or small group education and counseling on adjustment to heart disease, stress management and health-related lifestyle change. Teach and support self-help strategies.;Refer participants experiencing significant psychosocial distress to appropriate mental health specialists for further evaluation and treatment. When possible, include family members and significant others in education/counseling sessions.   Expected Outcomes Short Term: Participant demonstrates changes in health-related behavior, relaxation and other stress management skills, ability to obtain effective social support, and compliance with psychotropic medications if prescribed.;Long Term: Emotional wellbeing is indicated by absence of clinically significant psychosocial distress or social isolation.      Core Components/Risk Factors/Patient Goals Review:      Goals and Risk Factor Review    Row Name 07/21/16 1042 08/11/16 1558 09/08/16 1031         Core Components/Risk Factors/Patient Goals Review   Personal Goals Review Weight Management/Obesity;Improve shortness of breath with ADL's;Increase Strength and Stamina Weight Management/Obesity;Improve shortness of breath with ADL's;Increase Strength and Stamina Weight Management/Obesity;Develop more efficient breathing techniques such as purse lipped breathing and diaphragmatic breathing and practicing self-pacing with activity.;Improve shortness of breath with ADL's     Review  - see comment section on ITP see comment section on ITP     Expected Outcomes  - see Admission expected outcomes see Admission expected outcomes        Core Components/Risk  Factors/Patient Goals at Discharge (Final Review):      Goals and Risk Factor Review - 09/08/16 1031      Core Components/Risk Factors/Patient Goals Review   Personal Goals Review Weight Management/Obesity;Develop more efficient breathing techniques such as purse lipped breathing and diaphragmatic breathing and practicing self-pacing with activity.;Improve shortness of breath with ADL's   Review see comment section on ITP   Expected Outcomes see Admission expected outcomes      ITP Comments:   Comments:

## 2016-10-08 ENCOUNTER — Encounter (HOSPITAL_COMMUNITY): Payer: Self-pay | Admitting: *Deleted

## 2016-10-09 ENCOUNTER — Encounter (HOSPITAL_COMMUNITY): Payer: Medicare HMO

## 2016-10-09 ENCOUNTER — Encounter (HOSPITAL_COMMUNITY)
Admission: RE | Admit: 2016-10-09 | Discharge: 2016-10-09 | Disposition: A | Discharge status: Home / Self Care | Payer: Medicare HMO | Source: Ambulatory Visit | Attending: Family Medicine | Admitting: Family Medicine

## 2016-10-09 VITALS — Wt 238.8 lb

## 2016-10-09 DIAGNOSIS — R0602 Shortness of breath: Secondary | ICD-10-CM

## 2016-10-09 NOTE — Progress Notes (Signed)
Daily Session Note  Patient Details  Name: Shannon Schultz MRN: [id number] Date of Birth: 1945-08-26 Referring Provider:     Pulmonary Rehab Walk Test from 10/07/2016 in [place]  Referring Provider  Dr. Tomi Bamberger      Encounter Date: 10/09/2016  Check In:     Session Check In - 10/09/16 1033      Check-In   Location [facility]   Staff Present Rosebud Poles, RN, BSN;Vadhir Mcnay, MS, ACSM RCEP, Exercise Physiologist;Lisa Ysidro Evert, RN;Portia Rollene Rotunda, RN, BSN   Supervising physician immediately available to respond to emergencies [facility] immediately available   Physician(s) Dr. Cathlean Sauer   Medication changes reported     No   Fall or balance concerns reported    No   Tobacco Cessation No Change   Warm-up and Cool-down Performed as group-led instruction   Resistance Training Performed Yes   VAD Patient? No     Pain Assessment   Currently in Pain? No/denies   Multiple Pain Sites No      Capillary Blood Glucose: No results found for this or any previous visit (from the past 24 hour(s)).      Exercise Prescription Changes - 10/09/16 1200      Response to Exercise   Blood Pressure (Admit) 140/50   Blood Pressure (Exercise) 152/70   Blood Pressure (Exit) 124/70   Heart Rate (Admit) 74 bpm   Heart Rate (Exercise) 110 bpm   Heart Rate (Exit) 87 bpm   Oxygen Saturation (Admit) 95 %   Oxygen Saturation (Exercise) 90 %   Oxygen Saturation (Exit) 96 %   Rating of Perceived Exertion (Exercise) 15   Perceived Dyspnea (Exercise) 3   Duration Progress to 45 minutes of aerobic exercise without signs/symptoms of physical distress   Intensity THRR unchanged     Progression   Progression Continue to progress workloads to maintain intensity without signs/symptoms of physical distress.     Resistance Training   Training Prescription Yes   Weight orange bands   Reps 10-15   Time 10 Minutes     Oxygen   Oxygen Continuous   Liters 2     Bike   Level 3   Minutes 17     Track   Laps 10   Minutes 17      History  Smoking Status  . Former Smoker  . Packs/day: 1.00  . Years: 46.00  . Types: Cigarettes  . Quit date: 05/31/2011  Smokeless Tobacco  . Never Used    Goals Met:  Exercise tolerated well No report of cardiac concerns or symptoms Strength training completed today  Goals Unmet:  Not Applicable  Comments: Service time is from 10:30a to 12:05p    Dr. Rush Farmer is Medical Director for Pulmonary Rehab at [facility].

## 2016-10-14 ENCOUNTER — Encounter (HOSPITAL_COMMUNITY): Payer: Medicare HMO

## 2016-10-14 ENCOUNTER — Encounter (HOSPITAL_COMMUNITY)
Admission: RE | Admit: 2016-10-14 | Discharge: 2016-10-14 | Disposition: A | Discharge status: Home / Self Care | Payer: Medicare HMO | Source: Ambulatory Visit | Attending: Family Medicine | Admitting: Family Medicine

## 2016-10-14 DIAGNOSIS — R0602 Shortness of breath: Secondary | ICD-10-CM | POA: Diagnosis not present

## 2016-10-14 NOTE — Progress Notes (Signed)
Daily Session Note  Patient Details  Name: Shannon Schultz MRN: [id number] Date of Birth: 1946-04-16 Referring Provider:     Pulmonary Rehab Walk Test from 10/07/2016 in [place]  Referring Provider  Dr. Tomi Bamberger      Encounter Date: 10/14/2016  Check In:     Session Check In - 10/14/16 1223      Check-In   [facility]   Staff Present Su Hilt, MS, ACSM RCEP, Exercise Physiologist;Joan Leonia Reeves, RN, BSN;Lisa Ysidro Evert, RN;Portia Rollene Rotunda, RN, BSN   Supervising physician immediately available to respond to emergencies [facility] immediately available   Physician(s) Dr. Cathlean Sauer   Medication changes reported     No   Fall or balance concerns reported    No   Tobacco Cessation No Change   Warm-up and Cool-down Performed as group-led instruction   Resistance Training Performed Yes   VAD Patient? No     Pain Assessment   Currently in Pain? No/denies   Multiple Pain Sites No      Capillary Blood Glucose: No results found for this or any previous visit (from the past 24 hour(s)).     POCT Glucose - 10/14/16 1225      POCT Blood Glucose   Pre-Exercise 169 mg/dL   Post-Exercise 94 mg/dL         Exercise Prescription Changes - 10/14/16 1200      Response to Exercise   Blood Pressure (Admit) 110/64   Blood Pressure (Exercise) 144/80   Blood Pressure (Exit) 116/80   Heart Rate (Admit) 76 bpm   Heart Rate (Exercise) 100 bpm   Heart Rate (Exit) 78 bpm   Oxygen Saturation (Admit) 98 %   Oxygen Saturation (Exercise) 89 %   Oxygen Saturation (Exit) 97 %   Rating of Perceived Exertion (Exercise) 15   Perceived Dyspnea (Exercise) 3   Duration Progress to 45 minutes of aerobic exercise without signs/symptoms of physical distress   Intensity THRR unchanged     Progression   Progression Continue to progress workloads to maintain intensity without signs/symptoms of physical distress.     Resistance Training   Training Prescription Yes   Weight orange bands   Reps 10-15   Time 10 Minutes     Oxygen   Oxygen Continuous   Liters 2     Bike   Level 3   Minutes 17     NuStep   Level 4   Minutes 17   METs 1.9     Track   Laps 10   Minutes 17      History  Smoking Status  . Former Smoker  . Packs/day: 1.00  . Years: 46.00  . Types: Cigarettes  . Quit date: 05/31/2011  Smokeless Tobacco  . Never Used    Goals Met:  Exercise tolerated well No report of cardiac concerns or symptoms Strength training completed today  Goals Unmet:  Not Applicable  Comments: Service time is from 10:30a to 12:10p    Dr. Rush Farmer is Medical Director for Pulmonary Rehab at [facility].

## 2016-10-16 ENCOUNTER — Encounter (HOSPITAL_COMMUNITY): Payer: Medicare HMO

## 2016-10-16 ENCOUNTER — Encounter (HOSPITAL_COMMUNITY)
Admission: RE | Admit: 2016-10-16 | Discharge: 2016-10-16 | Disposition: A | Discharge status: Home / Self Care | Payer: Medicare HMO | Source: Ambulatory Visit | Attending: Family Medicine | Admitting: Family Medicine

## 2016-10-16 VITALS — Wt 237.7 lb

## 2016-10-16 DIAGNOSIS — R0602 Shortness of breath: Secondary | ICD-10-CM | POA: Diagnosis not present

## 2016-10-16 NOTE — Progress Notes (Signed)
Shannon Schultz 71 y.o. female Nutrition Note Spoke with pt. Pt is obese and wants to lose wt. Per discussion, pt has been on Weight Watchers and states "I know what to do to lose wt." Barriers to wt loss include pt is an emotional eater and she recently lost her sister to metastatic CAPt is making many healthy food choices. Pt's Rate Your Plate results reviewed with pt. Pt avoids most salty foods, which is an improvement from her previous review. Pt is diabetic. Last A1c indicates blood glucose well-controlled. Pt checks fasting CBG's daily. Pt expressed understanding of the information reviewed via feedback method.    Lab Results  Component Value Date   HGBA1C 6.1 (H) 08/18/2016   Nutrition Diagnosis ? Food-and nutrition-related knowledge deficit related to lack of exposure to information as related to diagnosis of pulmonary disease ? Overweight/obesity related to excessive energy intake as evidenced by a BMI of 41.4  Nutrition Intervention ? Pt's individual nutrition plan and goals reviewed with pt. ? Benefits of adopting healthy eating habits discussed when pt's Rate Your Plate reviewed. ? Pt to attend the Nutrition and Lung Disease class ? Continual client-centered nutrition education by RD, as part of interdisciplinary care. Goal(s) 1. Identify food quantities necessary to achieve wt loss of  -2# per week to a goal wt loss of 2.7-10.9 kg (6-24 lb) at graduation from pulmonary rehab. Monitor and Evaluate progress toward nutrition goal with team.   Derek Mound, M.Ed, RD, LDN, CDE 10/16/2016 11:57 AM

## 2016-10-16 NOTE — Progress Notes (Signed)
Daily Session Note  Patient Details  Name: Shannon Schultz MRN: [id number] Date of Birth: July 17, 1945 Referring Provider:     Pulmonary Rehab Walk Test from 10/07/2016 in [place]  Referring Provider  Dr. Tomi Bamberger      Encounter Date: 10/16/2016  Check In:     Session Check In - 10/16/16 1030      Check-In   [facility]   Staff Present Su Hilt, MS, ACSM RCEP, Exercise Physiologist;Joan Leonia Reeves, RN, Luisa Hart, RN, BSN   Supervising physician immediately available to respond to emergencies [facility] immediately available   Physician(s) Dr. Candiss Norse   Medication changes reported     No   Fall or balance concerns reported    No   Tobacco Cessation No Change   Warm-up and Cool-down Performed as group-led instruction   Resistance Training Performed Yes   VAD Patient? No     Pain Assessment   Currently in Pain? No/denies   Multiple Pain Sites No      Capillary Blood Glucose: No results found for this or any previous visit (from the past 24 hour(s)).      Exercise Prescription Changes - 10/16/16 1200      Response to Exercise   Blood Pressure (Admit) 112/52   Blood Pressure (Exercise) 140/72   Blood Pressure (Exit) 120/62   Heart Rate (Admit) 72 bpm   Heart Rate (Exercise) 102 bpm   Heart Rate (Exit) 73 bpm   Oxygen Saturation (Admit) 98 %   Oxygen Saturation (Exercise) 88 %   Oxygen Saturation (Exit) 93 %   Rating of Perceived Exertion (Exercise) 13   Perceived Dyspnea (Exercise) 2   Duration Progress to 45 minutes of aerobic exercise without signs/symptoms of physical distress   Intensity THRR unchanged     Progression   Progression Continue to progress workloads to maintain intensity without signs/symptoms of physical distress.     Resistance Training   Training Prescription Yes   Weight orange bands   Reps 10-15   Time 10 Minutes     Oxygen   Oxygen Continuous   Liters 2     Bike   Level 3   Minutes 17     Track   Laps 9   Minutes 17      History  Smoking Status  . Former Smoker  . Packs/day: 1.00  . Years: 46.00  . Types: Cigarettes  . Quit date: 05/31/2011  Smokeless Tobacco  . Never Used    Goals Met:  Exercise tolerated well No report of cardiac concerns or symptoms Strength training completed today  Goals Unmet:  Not Applicable  Comments: Service time is from 10:30a to 12:30p    Dr. Rush Farmer is Medical Director for Pulmonary Rehab at [facility].

## 2016-10-21 ENCOUNTER — Encounter (HOSPITAL_COMMUNITY)
Admission: RE | Admit: 2016-10-21 | Discharge: 2016-10-21 | Disposition: A | Discharge status: Home / Self Care | Payer: Medicare HMO | Source: Ambulatory Visit | Attending: Family Medicine | Admitting: Family Medicine

## 2016-10-21 ENCOUNTER — Encounter (HOSPITAL_COMMUNITY): Payer: Medicare HMO

## 2016-10-21 VITALS — Wt 241.0 lb

## 2016-10-21 DIAGNOSIS — R0602 Shortness of breath: Secondary | ICD-10-CM | POA: Diagnosis not present

## 2016-10-21 NOTE — Progress Notes (Signed)
Daily Session Note  Patient Details  Name: Shannon Schultz MRN: [id number] Date of Birth: 12/25/1945 Referring Provider:     Pulmonary Rehab Walk Test from 10/07/2016 in [place]  Referring Provider  Dr. Tomi Bamberger      Encounter Date: 10/21/2016  Check In:     Session Check In - 10/21/16 1016      Check-In   [facility]   Staff Present Su Hilt, MS, ACSM RCEP, Exercise Physiologist;Joan Leonia Reeves, RN, Luisa Hart, RN, Roque Cash, RN   Supervising physician immediately available to respond to emergencies [facility] immediately available   Physician(s) Dr. Candiss Norse   Medication changes reported     No   Fall or balance concerns reported    No   Tobacco Cessation No Change   Warm-up and Cool-down Performed as group-led instruction   Resistance Training Performed Yes   VAD Patient? No     Pain Assessment   Currently in Pain? No/denies   Multiple Pain Sites No      Capillary Blood Glucose: No results found for this or any previous visit (from the past 24 hour(s)).     POCT Glucose - 10/21/16 1203      POCT Blood Glucose   Pre-Exercise 169 mg/dL   Post-Exercise 105 mg/dL         Exercise Prescription Changes - 10/21/16 1200      Response to Exercise   Blood Pressure (Admit) 134/64   Blood Pressure (Exercise) 154/84   Blood Pressure (Exit) 118/70   Heart Rate (Admit) 82 bpm   Heart Rate (Exercise) 105 bpm   Heart Rate (Exit) 86 bpm   Oxygen Saturation (Admit) 97 %   Oxygen Saturation (Exercise) 89 %   Oxygen Saturation (Exit) 94 %   Rating of Perceived Exertion (Exercise) 15   Perceived Dyspnea (Exercise) 2   Duration Progress to 45 minutes of aerobic exercise without signs/symptoms of physical distress   Intensity THRR unchanged     Progression   Progression Continue to progress workloads to maintain intensity without signs/symptoms of physical distress.     Resistance Training   Training Prescription Yes   Weight orange bands   Reps 10-15   Time 10 Minutes     Oxygen   Oxygen Continuous   Liters 2     Bike   Level 3   Minutes 17     NuStep   Level 4   Minutes 17   METs 1.6     Track   Laps 11   Minutes 17      History  Smoking Status  . Former Smoker  . Packs/day: 1.00  . Years: 46.00  . Types: Cigarettes  . Quit date: 05/31/2011  Smokeless Tobacco  . Never Used    Goals Met:  Exercise tolerated well No report of cardiac concerns or symptoms Strength training completed today  Goals Unmet:  Not Applicable  Comments: Service time is from 10:30a to 12:00p    Dr. Rush Farmer is Medical Director for Pulmonary Rehab at [facility].

## 2016-10-23 ENCOUNTER — Encounter (HOSPITAL_COMMUNITY)
Admission: RE | Admit: 2016-10-23 | Discharge: 2016-10-23 | Disposition: A | Discharge status: Home / Self Care | Payer: Medicare HMO | Source: Ambulatory Visit | Attending: Family Medicine | Admitting: Family Medicine

## 2016-10-23 ENCOUNTER — Encounter (HOSPITAL_COMMUNITY): Payer: Medicare HMO

## 2016-10-23 VITALS — Wt 239.4 lb

## 2016-10-23 DIAGNOSIS — R0602 Shortness of breath: Secondary | ICD-10-CM | POA: Diagnosis not present

## 2016-10-23 DIAGNOSIS — E119 Type 2 diabetes mellitus without complications: Secondary | ICD-10-CM | POA: Diagnosis not present

## 2016-10-23 NOTE — Progress Notes (Signed)
Daily Session Note  Patient Details  Name: KARENE BRACKEN MRN: [id number] Date of Birth: Feb 12, 1946 Referring Provider:     Pulmonary Rehab Walk Test from 10/07/2016 in [place]  Referring Provider  Dr. Tomi Bamberger      Encounter Date: 10/23/2016  Check In:     Session Check In - 10/23/16 1056      Check-In   [facility]   Staff Present Trish Fountain, RN, Maxcine Ham, RN, BSN;Molly diVincenzo, MS, ACSM RCEP, Exercise Physiologist;Desmen Schoffstall Ysidro Evert, RN   Supervising physician immediately available to respond to emergencies [facility] immediately available   Physician(s) Dr. Posey Pronto   Medication changes reported     No   Fall or balance concerns reported    No   Tobacco Cessation No Change   Warm-up and Cool-down Performed as group-led instruction   Resistance Training Performed Yes   VAD Patient? No     Pain Assessment   Currently in Pain? No/denies   Multiple Pain Sites No      Capillary Blood Glucose: No results found for this or any previous visit (from the past 24 hour(s)).     POCT Glucose - 10/23/16 1258      POCT Blood Glucose   Pre-Exercise 178 mg/dL   Post-Exercise 119 mg/dL         Exercise Prescription Changes - 10/23/16 1200      Response to Exercise   Blood Pressure (Admit) 122/54   Blood Pressure (Exercise) 134/80   Blood Pressure (Exit) 110/60   Heart Rate (Admit) 74 bpm   Heart Rate (Exercise) 101 bpm   Heart Rate (Exit) 78 bpm   Oxygen Saturation (Admit) 93 %   Oxygen Saturation (Exercise) 92 %   Oxygen Saturation (Exit) 96 %   Rating of Perceived Exertion (Exercise) 13   Perceived Dyspnea (Exercise) 3   Duration Progress to 45 minutes of aerobic exercise without signs/symptoms of physical distress   Intensity THRR unchanged     Progression   Progression Continue to progress workloads to maintain intensity without signs/symptoms of physical distress.     Resistance Training   Training Prescription Yes   Weight orange bands   Reps 10-15   Time 10 Minutes     Oxygen   Oxygen Continuous   Liters 2     NuStep   Level 4   Minutes 17   METs 1.9     Track   Laps 13   Minutes 17      History  Smoking Status  . Former Smoker  . Packs/day: 1.00  . Years: 46.00  . Types: Cigarettes  . Quit date: 05/31/2011  Smokeless Tobacco  . Never Used    Goals Met:  Exercise tolerated well No report of cardiac concerns or symptoms Strength training completed today  Goals Unmet:  Not Applicable  Comments: Service time is from 1030 to 1235. Pt attended Doctor Day today.    Dr. Rush Farmer is Medical Director for Pulmonary Rehab at [facility].

## 2016-10-24 ENCOUNTER — Other Ambulatory Visit: Payer: Self-pay | Admitting: Family Medicine

## 2016-10-24 ENCOUNTER — Ambulatory Visit
Admission: RE | Admit: 2016-10-24 | Discharge: 2016-10-24 | Disposition: A | Discharge status: Home / Self Care | Payer: Medicare HMO | Source: Ambulatory Visit | Attending: Family Medicine | Admitting: Family Medicine

## 2016-10-24 DIAGNOSIS — Z1231 Encounter for screening mammogram for malignant neoplasm of breast: Secondary | ICD-10-CM

## 2016-10-27 ENCOUNTER — Other Ambulatory Visit: Payer: Self-pay | Admitting: Family Medicine

## 2016-10-27 DIAGNOSIS — R928 Other abnormal and inconclusive findings on diagnostic imaging of breast: Secondary | ICD-10-CM

## 2016-10-28 ENCOUNTER — Encounter (HOSPITAL_COMMUNITY): Payer: Medicare HMO

## 2016-10-28 ENCOUNTER — Encounter (HOSPITAL_COMMUNITY)
Admission: RE | Admit: 2016-10-28 | Discharge: 2016-10-28 | Disposition: A | Discharge status: Home / Self Care | Payer: Medicare HMO | Source: Ambulatory Visit | Attending: Family Medicine | Admitting: Family Medicine

## 2016-10-28 VITALS — Wt 242.1 lb

## 2016-10-28 DIAGNOSIS — R0602 Shortness of breath: Secondary | ICD-10-CM

## 2016-10-28 NOTE — Progress Notes (Signed)
Daily Session Note  Patient Details  Name: Shannon Schultz MRN: [id number] Date of Birth: 08/03/1945 Referring Provider:     Pulmonary Rehab Walk Test from 10/07/2016 in [place]  Referring Provider  Dr. Tomi Bamberger      Encounter Date: 10/28/2016  Check In:     Session Check In - 10/28/16 1030      Check-In   [facility]   Staff Present Rosebud Poles, RN, BSN;Brysin Towery, MS, ACSM RCEP, Exercise Physiologist;Lisa Ysidro Evert, RN;Portia Rollene Rotunda, RN, BSN   Supervising physician immediately available to respond to emergencies [facility] immediately available   Physician(s) Dr. Posey Pronto   Medication changes reported     No   Fall or balance concerns reported    No   Tobacco Cessation No Change   Warm-up and Cool-down Performed as group-led instruction   Resistance Training Performed Yes   VAD Patient? No     Pain Assessment   Currently in Pain? No/denies   Multiple Pain Sites No      Capillary Blood Glucose: No results found for this or any previous visit (from the past 24 hour(s)).     POCT Glucose - 10/28/16 1210      POCT Blood Glucose   Pre-Exercise 148 mg/dL   Post-Exercise 92 mg/dL  banana         Exercise Prescription Changes - 10/28/16 1200      Response to Exercise   Blood Pressure (Admit) 122/66   Blood Pressure (Exercise) 132/70   Blood Pressure (Exit) 118/60   Heart Rate (Admit) 76 bpm   Heart Rate (Exercise) 102 bpm   Heart Rate (Exit) 84 bpm   Oxygen Saturation (Admit) 96 %   Oxygen Saturation (Exercise) 89 %   Oxygen Saturation (Exit) 94 %   Rating of Perceived Exertion (Exercise) 15   Perceived Dyspnea (Exercise) 3   Duration Progress to 45 minutes of aerobic exercise without signs/symptoms of physical distress   Intensity THRR unchanged     Progression   Progression Continue to progress workloads to maintain intensity without signs/symptoms of physical distress.     Resistance  Training   Training Prescription Yes   Weight orange bands   Reps 10-15   Time 10 Minutes     Oxygen   Oxygen Continuous   Liters 2     Bike   Level 4   Minutes 17     NuStep   Level 5   Minutes 17   METs 2.1     Track   Laps 12   Minutes 17      History  Smoking Status  . Former Smoker  . Packs/day: 1.00  . Years: 46.00  . Types: Cigarettes  . Quit date: 05/31/2011  Smokeless Tobacco  . Never Used    Goals Met:  Exercise tolerated well No report of cardiac concerns or symptoms Strength training completed today  Goals Unmet:  Not Applicable  Comments: Service time is from 10:30a to 12:10p    Dr. Rush Farmer is Medical Director for Pulmonary Rehab at [facility].

## 2016-10-30 ENCOUNTER — Encounter (HOSPITAL_COMMUNITY): Payer: Medicare HMO

## 2016-10-30 ENCOUNTER — Encounter (HOSPITAL_COMMUNITY): Admission: RE | Admit: 2016-10-30 | Payer: Medicare HMO | Source: Ambulatory Visit

## 2016-11-02 DIAGNOSIS — J441 Chronic obstructive pulmonary disease with (acute) exacerbation: Secondary | ICD-10-CM | POA: Diagnosis not present

## 2016-11-02 DIAGNOSIS — J449 Chronic obstructive pulmonary disease, unspecified: Secondary | ICD-10-CM | POA: Diagnosis not present

## 2016-11-03 ENCOUNTER — Ambulatory Visit
Admission: RE | Admit: 2016-11-03 | Discharge: 2016-11-03 | Disposition: A | Discharge status: Home / Self Care | Payer: Medicare HMO | Source: Ambulatory Visit | Attending: Family Medicine | Admitting: Family Medicine

## 2016-11-03 ENCOUNTER — Other Ambulatory Visit: Payer: Self-pay | Admitting: Family Medicine

## 2016-11-03 DIAGNOSIS — R928 Other abnormal and inconclusive findings on diagnostic imaging of breast: Secondary | ICD-10-CM

## 2016-11-03 DIAGNOSIS — N6489 Other specified disorders of breast: Secondary | ICD-10-CM

## 2016-11-03 DIAGNOSIS — N649 Disorder of breast, unspecified: Secondary | ICD-10-CM | POA: Diagnosis not present

## 2016-11-03 DIAGNOSIS — R922 Inconclusive mammogram: Secondary | ICD-10-CM | POA: Diagnosis not present

## 2016-11-03 NOTE — Progress Notes (Signed)
Pulmonary Individual Treatment Plan  Patient Details  Name: Shannon Schultz MRN: [id number] Date of Birth: 1945-12-06 Referring Provider:     Pulmonary Rehab Walk Test from 10/07/2016 in [facility]  Referring Provider  Dr. Tomi Bamberger      Initial Encounter Date:    Pulmonary Rehab Walk Test from 10/07/2016 in [facility]  Date  10/07/16  Referring Provider  Dr. Tomi Bamberger      Visit Diagnosis: Shortness of breath  Patient's Home Medications on Admission:   Current Outpatient Prescriptions:  .  acetaminophen (TYLENOL) 325 MG tablet, Take 650 mg by mouth every 6 (six) hours as needed for mild pain, fever or headache. Reported on 10/08/2015, Disp: , Rfl:  .  albuterol (PROVENTIL) (2.5 MG/3ML) 0.083% nebulizer solution, USE 1 VIAL BY NEBULIZATION EVERY FOUR HOURS AS NEEDED FOR WHEEZING OR SHORTNESS OF BREATH. (Patient not taking: Reported on 09/29/2016), Disp: 270 mL, Rfl: 0 .  aspirin EC 81 MG tablet, Take 81 mg by mouth daily.  , Disp: , Rfl:  .  bisoprolol-hydrochlorothiazide (ZIAC) 10-6.25 MG tablet, TAKE 1 TABLET EVERY DAY, Disp: 90 tablet, Rfl: 1 .  Calcium Carbonate-Vitamin D (CALCIUM 600+D) 600-400 MG-UNIT per tablet, Take 1 tablet by mouth daily. , Disp: , Rfl:  .  cetirizine (KLS ALLER-TEC) 10 MG tablet, Take 10 mg by mouth daily., Disp: , Rfl:  .  Cholecalciferol (VITAMIN D) 2000 UNITS tablet, Take 4,000 Units by mouth daily., Disp: , Rfl:  .  fluticasone (FLONASE) 50 MCG/ACT nasal spray, Place 2 sprays into both nostrils daily., Disp: 48 g, Rfl: 3 .  Fluticasone-Umeclidin-Vilant (TRELEGY ELLIPTA) 100-62.5-25 MCG/INH AEPB, Inhale 1 puff into the lungs daily., Disp: 180 each, Rfl: 3 .  furosemide (LASIX) 20 MG tablet, TAKE 1 TABLET BY MOUTH ONLY IF NEEDED FOR SWELLING (Patient not taking: Reported on 09/29/2016), Disp: 30 tablet, Rfl: 2 .  guaiFENesin (MUCINEX) 600 MG 12 hr tablet, Take 600 mg by mouth 2 (two) times daily., Disp: ,  Rfl:  .  metFORMIN (GLUCOPHAGE-XR) 500 MG 24 hr tablet, TAKE 1 TABLET THREE TIMES DAILY, Disp: 270 tablet, Rfl: 1 .  Multiple Vitamins-Minerals (CENTRUM SILVER PO), Take 1 tablet by mouth.  , Disp: , Rfl:  .  PROAIR HFA 108 (90 Base) MCG/ACT inhaler, INHALE 2 PUFFS INTO THE LUNGS EVERY 6 (SIX) HOURS AS NEEDED FOR WHEEZING. (Patient not taking: Reported on 09/29/2016), Disp: 54 g, Rfl: 0 .  simvastatin (ZOCOR) 20 MG tablet, TAKE 1 TABLET AT BEDTIME, Disp: 90 tablet, Rfl: 0 .  TRUEPLUS LANCETS 33G MISC, TEST ONE TIME DAILY, Disp: 100 each, Rfl: 3 .  valACYclovir (VALTREX) 500 MG tablet, TAKE 1 TABLET EVERY DAY, Disp: 90 tablet, Rfl: 3  Past Medical History: Past Medical History:  Diagnosis Date  . Allergy   . Cancer ([place]) 1992   R breast, DCIS  . Colon polyp   . COPD (chronic obstructive pulmonary disease) ([place])   . Diverticulosis   . Elevated cholesterol   . Elevated liver enzymes    fatty liver per ultrasound per pt  . Family history of malignant neoplasm of breast   . FHx: BRCA2 gene positive    sister with BRCA2 mutation (pt tested NEGATIVE)  . Genital HSV    gets on hip  . Heart murmur     echo 05/2011 mild aortic stenosis  . HSV (herpes simplex virus) infection    on hip--on daily suppression  . Hypertension   .  Impaired glucose tolerance   . Migraine   . Osteopenia   . Pneumonia 06/06/2011  . Type 2 diabetes mellitus ([place])   . Vitamin D deficiency disease     Tobacco Use: History  Smoking Status  . Former Smoker  . Packs/day: 1.00  . Years: 46.00  . Types: Cigarettes  . Quit date: 05/31/2011  Smokeless Tobacco  . Never Used    Labs: Recent Review Flowsheet Data    Labs for ITP Cardiac and Pulmonary Rehab Latest Ref Rng & Units 01/04/2015 07/19/2015 11/08/2015 02/11/2016 08/18/2016   Cholestrol <200 mg/dL 110 124(L) - - 113   LDLCALC <100 mg/dL 47 57 - - 43   HDL >50 mg/dL 44(L) 50 - - 51   Trlycerides <150 mg/dL 96 84 - - 93   Hemoglobin A1c <5.7 % 6.7(H) 7.2(H)  6.3 5.6 6.1(H)   PHART 7.350 - 7.400 - - - - -   PCO2ART 35.0 - 45.0 mmHg - - - - -   HCO3 20.0 - 24.0 mEq/L - - - - -   TCO2 0 - 100 mmol/L - - - - -   O2SAT % - - - - -      Capillary Blood Glucose: Lab Results  Component Value Date   GLUCAP 304 (H) 08/18/2015   GLUCAP 222 (H) 08/18/2015   GLUCAP 237 (H) 08/17/2015   GLUCAP 167 (H) 08/17/2015   GLUCAP 258 (H) 08/17/2015       POCT Glucose    Row Name 07/29/16 1218 07/31/16 1301 08/05/16 1210 08/12/16 1230 08/19/16 1209     POCT Blood Glucose   Pre-Exercise 196 mg/dL 180 mg/dL 164 mg/dL 189 mg/dL 151 mg/dL   Post-Exercise 108 mg/dL 135 mg/dL 124 mg/dL 126 mg/dL 95 mg/dL  banana   Row Name 08/21/16 1242 08/26/16 1243 09/02/16 1222 09/02/16 1226 09/04/16 1241     POCT Blood Glucose   Pre-Exercise 197 mg/dL 200 mg/dL 161 mg/dL  - 150 mg/dL   Post-Exercise 97 mg/dL  Pt given a bananna after execise 118 mg/dL 102 mg/dL -  gave pt bananna for snack 121 mg/dL   Row Name 10/14/16 1225 10/21/16 1203 10/23/16 1258 10/28/16 1210       POCT Blood Glucose   Pre-Exercise 169 mg/dL 169 mg/dL 178 mg/dL 148 mg/dL    Post-Exercise 94 mg/dL 105 mg/dL 119 mg/dL 92 mg/dL  banana       ADL UCSD:     Pulmonary Assessment Scores    Row Name 10/08/16 0943         ADL UCSD   ADL Phase Entry     SOB Score total 20       CAT Score   CAT Score 7        Pulmonary Function Assessment:     Pulmonary Function Assessment - 09/29/16 1018      Breath   Bilateral Breath Sounds Clear   Shortness of Breath Yes;Limiting activity      Exercise Target Goals:    Exercise Program Goal: Individual exercise prescription set with THRR, safety & activity barriers. Participant demonstrates ability to understand and report RPE using BORG scale, to self-measure pulse accurately, and to acknowledge the importance of the exercise prescription.  Exercise Prescription Goal: Starting with aerobic activity 30 plus minutes a day, 3 days per  week for initial exercise prescription. Provide home exercise prescription and guidelines that participant acknowledges understanding prior to discharge.  Activity Barriers & Risk Stratification:  Activity Barriers & Cardiac Risk Stratification - 09/29/16 0956      Activity Barriers & Cardiac Risk Stratification   Activity Barriers Shortness of Breath;Deconditioning      6 Minute Walk:     6 Minute Walk    Row Name 07/22/16 1623 10/07/16 1616       6 Minute Walk   Phase Initial Initial    Distance 810 feet 1155 feet    Walk Time 6 minutes 6 minutes    # of Rest Breaks 1  1 minute 11 seconds 0    MPH 1.53 2.18    METS 2.15 2.61    RPE 13 13    Perceived Dyspnea  3 1    Symptoms No No    Resting HR 92 bpm 68 bpm    Resting BP 128/70 120/56    Max Ex. HR 120 bpm 108 bpm    Max Ex. BP 142/80 140/90    2 Minute Post BP  - 138/82      Interval HR   Baseline HR 92 68    1 Minute HR 102 91    2 Minute HR 117 91    3 Minute HR 120 104    4 Minute HR 119 104    5 Minute HR 117 105    6 Minute HR 109 108    2 Minute Post HR 103 85    Interval Heart Rate? Yes Yes      Interval Oxygen   Interval Oxygen? Yes Yes    Baseline Oxygen Saturation % 92 % 98 %    Baseline Liters of Oxygen 0 L 2 L    1 Minute Oxygen Saturation % 89 % 92 %    1 Minute Liters of Oxygen 0 L 2 L    2 Minute Oxygen Saturation % 84 % 93 %    2 Minute Liters of Oxygen 0 L 2 L    3 Minute Oxygen Saturation % 81 % 88 %    3 Minute Liters of Oxygen 0 L 2 L    4 Minute Oxygen Saturation % 89 % 88 %    4 Minute Liters of Oxygen 2 L 2 L    5 Minute Oxygen Saturation % 88 % 89 %    5 Minute Liters of Oxygen 2 L 2 L    6 Minute Oxygen Saturation % 86 % 89 %    6 Minute Liters of Oxygen 2 L 2 L    2 Minute Post Oxygen Saturation % 92 % 97 %    2 Minute Post Liters of Oxygen 2 L 2 L       Oxygen Initial Assessment:     Oxygen Initial Assessment - 10/07/16 1623      Home Oxygen   Home Oxygen  Device Portable Concentrator;Home Concentrator   Sleep Oxygen Prescription Continuous;None   Home Exercise Oxygen Prescription Pulsed   Liters per minute 2   Home at Rest Exercise Oxygen Prescription None   Compliance with Home Oxygen Use Yes     Initial 6 min Walk   Oxygen Used Continuous;E-Tanks   Liters per minute 2   Resting Oxygen Saturation  during 6 min walk 98 %   Exercise Oxygen Saturation  during 6 min walk 88 %     Program Oxygen Prescription   Program Oxygen Prescription Continuous;E-Tanks   Liters per minute 2      Oxygen Re-Evaluation:     Oxygen  Re-Evaluation    Row Name 11/03/16 1630             Program Oxygen Prescription   Program Oxygen Prescription Continuous;E-Tanks       Liters per minute 2         Home Oxygen   Home Oxygen Device Portable Concentrator;Home Concentrator       Sleep Oxygen Prescription None       Home Exercise Oxygen Prescription Pulsed       Liters per minute 2       Home at Rest Exercise Oxygen Prescription None       Compliance with Home Oxygen Use Yes         Goals/Expected Outcomes   Short Term Goals To learn and exhibit compliance with exercise, home and travel O2 prescription       Long  Term Goals Exhibits compliance with exercise, home and travel O2 prescription;Maintenance of O2 saturations>88%;Compliance with respiratory medication;Verbalizes importance of monitoring SPO2 with pulse oximeter and return demonstration;Exhibits proper breathing techniques, such as purse lipped breathing or other method taught during program session;Demonstrates proper use of MDI's       Comments patient verbalizes compliance with home O2 perscription. She always wears her oxygen to pulmonary rehab and is observed wearing her oxygen when she leaves.       Goals/Expected Outcomes see above goals          Oxygen Discharge (Final Oxygen Re-Evaluation):     Oxygen Re-Evaluation - 11/03/16 1630      Program Oxygen Prescription   Program  Oxygen Prescription Continuous;E-Tanks   Liters per minute 2     Home Oxygen   Home Oxygen Device Portable Concentrator;Home Concentrator   Sleep Oxygen Prescription None   Home Exercise Oxygen Prescription Pulsed   Liters per minute 2   Home at Rest Exercise Oxygen Prescription None   Compliance with Home Oxygen Use Yes     Goals/Expected Outcomes   Short Term Goals To learn and exhibit compliance with exercise, home and travel O2 prescription   Long  Term Goals Exhibits compliance with exercise, home and travel O2 prescription;Maintenance of O2 saturations>88%;Compliance with respiratory medication;Verbalizes importance of monitoring SPO2 with pulse oximeter and return demonstration;Exhibits proper breathing techniques, such as purse lipped breathing or other method taught during program session;Demonstrates proper use of MDI's   Comments patient verbalizes compliance with home O2 perscription. She always wears her oxygen to pulmonary rehab and is observed wearing her oxygen when she leaves.   Goals/Expected Outcomes see above goals      Initial Exercise Prescription:     Initial Exercise Prescription - 10/07/16 1600      Date of Initial Exercise RX and Referring Provider   Date 10/07/16   Referring Provider Dr. Tomi Bamberger     Oxygen   Oxygen Continuous   Liters 2     Bike   Level 2   Minutes 17     NuStep   Level 3   Minutes 17   METs 1.5     Track   Laps 10   Minutes 17     Prescription Details   Frequency (times per week) 2   Duration Progress to 45 minutes of aerobic exercise without signs/symptoms of physical distress     Intensity   THRR 40-80% of Max Heartrate 60-120   Ratings of Perceived Exertion 11-13   Perceived Dyspnea 0-4     Progression   Progression Continue progressive overload as per  policy without signs/symptoms or physical distress.     Resistance Training   Training Prescription Yes   Weight orange bands   Reps 10-15      Perform  Capillary Blood Glucose checks as needed.  Exercise Prescription Changes:     Exercise Prescription Changes    Row Name 07/29/16 1200 07/31/16 1259 08/05/16 1200 08/07/16 1300 08/12/16 1200     Response to Exercise   Blood Pressure (Admit) 120/70 110/60 126/70 126/68 110/60   Blood Pressure (Exercise) 166/78 144/70 128/66 128/74 160/62   Blood Pressure (Exit) 140/82 110/70 122/60 102/60 100/64   Heart Rate (Admit) 85 bpm 75 bpm 71 bpm 90 bpm 82 bpm   Heart Rate (Exercise) 101 bpm 100 bpm 101 bpm 107 bpm 107 bpm   Heart Rate (Exit) 77 bpm 70 bpm 94 bpm 95 bpm 94 bpm   Oxygen Saturation (Admit) 99 % 92 % 94 % 89 % 93 %   Oxygen Saturation (Exercise) 88 % 94 % 91 % 90 % 93 %   Oxygen Saturation (Exit) 97 % 98 % 94 % 98 % 94 %   Rating of Perceived Exertion (Exercise) '13 13 15 15 13   '$ Perceived Dyspnea (Exercise) '2 3 2 2 1   '$ Duration Progress to 45 minutes of aerobic exercise without signs/symptoms of physical distress Progress to 45 minutes of aerobic exercise without signs/symptoms of physical distress Progress to 45 minutes of aerobic exercise without signs/symptoms of physical distress Progress to 45 minutes of aerobic exercise without signs/symptoms of physical distress Progress to 45 minutes of aerobic exercise without signs/symptoms of physical distress   Intensity Other (comment) Other (comment) Other (comment) Other (comment) THRR unchanged     Progression   Progression -  40-80% of HRR -  40-80% of HRR -  40-80% of HRR -  40-80% of HRR Continue to progress workloads to maintain intensity without signs/symptoms of physical distress.     Resistance Training   Training Prescription Yes Yes Yes Yes Yes   Weight orange bands orange bands orange bands orange bands orange bands'   Reps 10-12  10 minutes of strength training 10-12  10 minutes of strength training 10-12  10 minutes of strength training 10-12  10 minutes of strength training 10-12  10 minutes of strength training      Oxygen   Oxygen Continuous Continuous Continuous Continuous Continuous   Liters '2 2 2 2 2     '$ Recumbant Bike   Level '2 2 2 2  '$ pt felt dizzy for a few minutes 2   Minutes '17 17 17 17 17     '$ NuStep   Level '2 2 2  '$ - 2   Minutes '17 17 17  '$ - 17   METs 1.4 1.5 1.6  - 1.7     Track   Laps 8  - '9 10 13   '$ Minutes 17  - '17 17 17   '$ Row Name 08/14/16 1200 08/19/16 1206 08/21/16 1200 08/26/16 1239 08/28/16 1200     Response to Exercise   Blood Pressure (Admit) 110/56 122/62 124/66 106/60 120/66   Blood Pressure (Exercise) 158/80 126/78 160/80 150/78 150/78   Blood Pressure (Exit) 128/70 126/74 130/64 122/60 120/68   Heart Rate (Admit) 70 bpm 81 bpm 76 bpm 71 bpm 74 bpm   Heart Rate (Exercise) 104 bpm 106 bpm 111 bpm 97 bpm 100 bpm   Heart Rate (Exit) 79 bpm 88 bpm 92 bpm 82 bpm 77 bpm  Oxygen Saturation (Admit) 95 % 97 % 96 % 93 % 93 %   Oxygen Saturation (Exercise) 93 % 94 % 89 % 95 % 93 %   Oxygen Saturation (Exit) 97 % 95 % 96 % 82 % 95 %   Rating of Perceived Exertion (Exercise) '13 13 14 13 11   '$ Perceived Dyspnea (Exercise) '3 2 2 1 1   '$ Symptoms  -  -  -  - 1   Duration Progress to 45 minutes of aerobic exercise without signs/symptoms of physical distress Progress to 45 minutes of aerobic exercise without signs/symptoms of physical distress Progress to 45 minutes of aerobic exercise without signs/symptoms of physical distress Progress to 45 minutes of aerobic exercise without signs/symptoms of physical distress Progress to 45 minutes of aerobic exercise without signs/symptoms of physical distress   Intensity THRR unchanged THRR unchanged THRR unchanged THRR unchanged THRR unchanged     Progression   Progression Continue to progress workloads to maintain intensity without signs/symptoms of physical distress. Continue to progress workloads to maintain intensity without signs/symptoms of physical distress. Continue to progress workloads to maintain intensity without signs/symptoms of  physical distress. Continue to progress workloads to maintain intensity without signs/symptoms of physical distress. Continue to progress workloads to maintain intensity without signs/symptoms of physical distress.     Resistance Training   Training Prescription Yes Yes Yes Yes Yes   Weight orange bands' orange bands' orange bands orange bands orange bands   Reps 10-12  10 minutes of strength training 10-12  10 minutes of strength training 10-12  10 minutes of strength training 10-15 10-15   Time  -  -  - 10 Minutes 10 Minutes     Oxygen   Oxygen Continuous Continuous Continuous Continuous Continuous   Liters '2 2 2 2 2     '$ Recumbant Bike   Level '2 2 2 2 2   '$ Minutes '17 17 17 17 17     '$ NuStep   Level 2 2  - 3 4   Minutes 17 17  - 17 17   METs 1.7 1.7  - 1.8  -     Track   Laps  - '13 14 12  '$ -   Minutes  - '17 17 17  '$ -   Row Name 09/02/16 1200 09/04/16 1200 10/09/16 1200 10/14/16 1200 10/16/16 1200     Response to Exercise   Blood Pressure (Admit) 112/56 110/60 140/50 110/64 112/52   Blood Pressure (Exercise) 126/64 116/70 152/70 144/80 140/72   Blood Pressure (Exit) 124/74 100/64 124/70 116/80 120/62   Heart Rate (Admit) 74 bpm 102 bpm 74 bpm 76 bpm 72 bpm   Heart Rate (Exercise) 103 bpm 103 bpm 110 bpm 100 bpm 102 bpm   Heart Rate (Exit) 90 bpm 96 bpm 87 bpm 78 bpm 73 bpm   Oxygen Saturation (Admit) 97 % 95 % 95 % 98 % 98 %   Oxygen Saturation (Exercise) 91 % 91 % 90 % 89 % 88 %   Oxygen Saturation (Exit) 95 % 95 % 96 % 97 % 93 %   Rating of Perceived Exertion (Exercise) '13 13 15 15 13   '$ Perceived Dyspnea (Exercise) '2 1 3 3 2   '$ Duration Progress to 45 minutes of aerobic exercise without signs/symptoms of physical distress Progress to 45 minutes of aerobic exercise without signs/symptoms of physical distress Progress to 45 minutes of aerobic exercise without signs/symptoms of physical distress Progress to 45 minutes of aerobic exercise without  signs/symptoms of physical distress  Progress to 45 minutes of aerobic exercise without signs/symptoms of physical distress   Intensity THRR unchanged THRR unchanged THRR unchanged THRR unchanged THRR unchanged     Progression   Progression Continue to progress workloads to maintain intensity without signs/symptoms of physical distress. Continue to progress workloads to maintain intensity without signs/symptoms of physical distress. Continue to progress workloads to maintain intensity without signs/symptoms of physical distress. Continue to progress workloads to maintain intensity without signs/symptoms of physical distress. Continue to progress workloads to maintain intensity without signs/symptoms of physical distress.     Resistance Training   Training Prescription Yes Yes Yes Yes Yes   Weight orange bands orange bands orange bands orange bands orange bands   Reps 10-15 10-15 10-15 10-15 10-15   Time 10 Minutes 10 Minutes 10 Minutes 10 Minutes 10 Minutes     Oxygen   Oxygen Continuous Continuous Continuous Continuous Continuous   Liters '2 2 2 2 2     '$ Bike   Level '2 2 3 3 3   '$ Minutes '17 17 17 17 17     '$ NuStep   Level  -  -  - 4  -   Minutes  -  -  - 17  -   METs  -  -  - 1.9  -     Track   Laps '12 11 10 10 9   '$ Minutes '17 17 17 17 17   '$ Row Name 10/21/16 1200 10/23/16 1200 10/28/16 1200         Response to Exercise   Blood Pressure (Admit) 134/64 122/54 122/66     Blood Pressure (Exercise) 154/84 134/80 132/70     Blood Pressure (Exit) 118/70 110/60 118/60     Heart Rate (Admit) 82 bpm 74 bpm 76 bpm     Heart Rate (Exercise) 105 bpm 101 bpm 102 bpm     Heart Rate (Exit) 86 bpm 78 bpm 84 bpm     Oxygen Saturation (Admit) 97 % 93 % 96 %     Oxygen Saturation (Exercise) 89 % 92 % 89 %     Oxygen Saturation (Exit) 94 % 96 % 94 %     Rating of Perceived Exertion (Exercise) '15 13 15     '$ Perceived Dyspnea (Exercise) '2 3 3     '$ Duration Progress to 45 minutes of aerobic exercise without signs/symptoms of physical  distress Progress to 45 minutes of aerobic exercise without signs/symptoms of physical distress Progress to 45 minutes of aerobic exercise without signs/symptoms of physical distress     Intensity THRR unchanged THRR unchanged THRR unchanged       Progression   Progression Continue to progress workloads to maintain intensity without signs/symptoms of physical distress. Continue to progress workloads to maintain intensity without signs/symptoms of physical distress. Continue to progress workloads to maintain intensity without signs/symptoms of physical distress.       Resistance Training   Training Prescription Yes Yes Yes     Weight orange bands orange bands orange bands     Reps 10-15 10-15 10-15     Time 10 Minutes 10 Minutes 10 Minutes       Oxygen   Oxygen Continuous Continuous Continuous     Liters '2 2 2       '$ Bike   Level 3  - 4     Minutes 17  - 17       NuStep   Level 4 4  5     Minutes '17 17 17     '$ METs 1.6 1.9 2.1       Track   Laps '11 13 12     '$ Minutes '17 17 17        '$ Exercise Comments:     Exercise Comments    Row Name 08/11/16 1509 09/04/16 1242         Exercise Comments Patient has only attended 4 sessions. She is on 2 liters of oxygen during exercise which is new to her. She is walking up to 10 laps in 15 minutes. Will cont. to monitor and progress.  Used own POC while on walking track         Exercise Goals and Review:     Exercise Goals    Row Name 09/30/16 1211             Exercise Goals   Increase Physical Activity Yes       Intervention Provide advice, education, support and counseling about physical activity/exercise needs.;Develop an individualized exercise prescription for aerobic and resistive training based on initial evaluation findings, risk stratification, comorbidities and participant's personal goals.       Expected Outcomes Achievement of increased cardiorespiratory fitness and enhanced flexibility, muscular endurance and strength  shown through measurements of functional capacity and personal statement of participant.       Increase Strength and Stamina Yes       Intervention Provide advice, education, support and counseling about physical activity/exercise needs.;Develop an individualized exercise prescription for aerobic and resistive training based on initial evaluation findings, risk stratification, comorbidities and participant's personal goals.       Expected Outcomes Achievement of increased cardiorespiratory fitness and enhanced flexibility, muscular endurance and strength shown through measurements of functional capacity and personal statement of participant.          Exercise Goals Re-Evaluation :     Exercise Goals Re-Evaluation    Row Name 09/09/16 0809 11/03/16 0740           Exercise Goal Re-Evaluation   Exercise Goals Review Increase Strenth and Stamina;Increase Physical Activity Increase Physical Activity;Increase Strenth and Stamina      Comments Patient is slowly progressing in program. Very consistent attendance. Up to 14 laps (260fperlap) in 15 minutes. Will cont. to monitor and progress.  Patient is slowly progressing in program. Up to 12-14 laps (2024ferlap) in 15 minutes. Will cont. to monitor and progress.       Expected Outcomes Through exercise at rehab and home patient will increase physical capacity, stength, and stamina. Through exercise at rehab and home patient will increase physical capacity, stength, and stamina.         Discharge Exercise Prescription (Final Exercise Prescription Changes):     Exercise Prescription Changes - 10/28/16 1200      Response to Exercise   Blood Pressure (Admit) 122/66   Blood Pressure (Exercise) 132/70   Blood Pressure (Exit) 118/60   Heart Rate (Admit) 76 bpm   Heart Rate (Exercise) 102 bpm   Heart Rate (Exit) 84 bpm   Oxygen Saturation (Admit) 96 %   Oxygen Saturation (Exercise) 89 %   Oxygen Saturation (Exit) 94 %   Rating of Perceived  Exertion (Exercise) 15   Perceived Dyspnea (Exercise) 3   Duration Progress to 45 minutes of aerobic exercise without signs/symptoms of physical distress   Intensity THRR unchanged     Progression   Progression Continue to progress workloads to maintain intensity without  signs/symptoms of physical distress.     Resistance Training   Training Prescription Yes   Weight orange bands   Reps 10-15   Time 10 Minutes     Oxygen   Oxygen Continuous   Liters 2     Bike   Level 4   Minutes 17     NuStep   Level 5   Minutes 17   METs 2.1     Track   Laps 12   Minutes 17      Nutrition:  Target Goals: Understanding of nutrition guidelines, daily intake of sodium '1500mg'$ , cholesterol '200mg'$ , calories 30% from fat and 7% or less from saturated fats, daily to have 5 or more servings of fruits and vegetables.  Biometrics:     Pre Biometrics - 09/29/16 0958      Pre Biometrics   Grip Strength 25 kg       Nutrition Therapy Plan and Nutrition Goals:     Nutrition Therapy & Goals - 10/16/16 1040      Nutrition Therapy   Diet Carb Modified, Therapeutic Lifestyle Changes     Personal Nutrition Goals   Nutrition Goal Wt loss of 1-2 lb/week to a wt loss goal of 6-24 lb at graduation from rehab.      Intervention Plan   Intervention Prescribe, educate and counsel regarding individualized specific dietary modifications aiming towards targeted core components such as weight, hypertension, lipid management, diabetes, heart failure and other comorbidities.   Expected Outcomes Short Term Goal: Understand basic principles of dietary content, such as calories, fat, sodium, cholesterol and nutrients.;Long Term Goal: Adherence to prescribed nutrition plan.      Nutrition Discharge: Rate Your Plate Scores:     Nutrition Assessments - 10/16/16 1031      Rate Your Plate Scores   Pre Score 59      Nutrition Goals Re-Evaluation:     Nutrition Goals Re-Evaluation    Row Name  09/17/16 0806             Goals   Current Weight 235 lb 14.3 oz (107 kg)       Nutrition Goal 1-2 lb wt loss/week to a wt loss goal of 6-24 lb at graduation from Pulmonary Rehab       Comment Pt wt is down 4 lb (1.8 kg). Minimum wt loss goal of 6 lb not met.        Expected Outcome Continue diet and lifestyle changes to promote desired wt loss.           Nutrition Goals Discharge (Final Nutrition Goals Re-Evaluation):     Nutrition Goals Re-Evaluation - 09/17/16 0806      Goals   Current Weight 235 lb 14.3 oz (107 kg)   Nutrition Goal 1-2 lb wt loss/week to a wt loss goal of 6-24 lb at graduation from Pulmonary Rehab   Comment Pt wt is down 4 lb (1.8 kg). Minimum wt loss goal of 6 lb not met.    Expected Outcome Continue diet and lifestyle changes to promote desired wt loss.       Psychosocial: Target Goals: Acknowledge presence or absence of significant depression and/or stress, maximize coping skills, provide positive support system. Participant is able to verbalize types and ability to use techniques and skills needed for reducing stress and depression.  Initial Review & Psychosocial Screening:     Initial Psych Review & Screening - 09/29/16 1026      Initial Review   Current  issues with Current Stress Concerns   Source of Stress Concerns Family     Family Dynamics   Good Support System? Yes   Comments recent loss of sister     Barriers   Psychosocial barriers to participate in program The patient should benefit from training in stress management and relaxation.     Screening Interventions   Interventions Encouraged to exercise;Yes   Expected Outcomes Short Term goal: Utilizing psychosocial counselor, staff and physician to assist with identification of specific Stressors or current issues interfering with healing process. Setting desired goal for each stressor or current issue identified.;Long Term Goal: Stressors or current issues are controlled or  eliminated.;Short Term goal: Identification and review with participant of any Quality of Life or Depression concerns found by scoring the questionnaire.;Long Term goal: The participant improves quality of Life and PHQ9 Scores as seen by post scores and/or verbalization of changes      Quality of Life Scores:   PHQ-9: Recent Review Flowsheet Data    Depression screen [facility] 2/9 09/29/2016 07/21/2016 02/14/2016 01/10/2015 07/06/2013   Decreased Interest 0 0 0 0 0   Down, Depressed, Hopeless 0 0 0 0 0   PHQ - 2 Score 0 0 0 0 0     Interpretation of Total Score  Total Score Depression Severity:  1-4 = Minimal depression, 5-9 = Mild depression, 10-14 = Moderate depression, 15-19 = Moderately severe depression, 20-27 = Severe depression   Psychosocial Evaluation and Intervention:     Psychosocial Evaluation - 09/29/16 1029      Psychosocial Evaluation & Interventions   Interventions Encouraged to exercise with the program and follow exercise prescription   Continue Psychosocial Services  Follow up required by staff      Psychosocial Re-Evaluation:     Psychosocial Re-Evaluation    Row Name 08/11/16 1558 09/08/16 1216 11/03/16 1638         Psychosocial Re-Evaluation   Comments no psychosocial barriers identified since admission  - no psychosocial barriers identified since admission     Expected Outcomes  - patient will remain free from psychosocial barriers patient will remain free from psychosocial barriers     Interventions Encouraged to attend Pulmonary Rehabilitation for the exercise  - Encouraged to attend Pulmonary Rehabilitation for the exercise        Psychosocial Discharge (Final Psychosocial Re-Evaluation):     Psychosocial Re-Evaluation - 11/03/16 1638      Psychosocial Re-Evaluation   Comments no psychosocial barriers identified since admission   Expected Outcomes patient will remain free from psychosocial barriers   Interventions Encouraged to attend Pulmonary  Rehabilitation for the exercise      Education: Education Goals: Education classes will be provided on a weekly basis, covering required topics. Participant will state understanding/return demonstration of topics presented.  Learning Barriers/Preferences:     Learning Barriers/Preferences - 09/29/16 1018      Learning Barriers/Preferences   Learning Barriers None   Learning Preferences Computer/Internet;Group Instruction;Verbal Instruction;Written Material      Education Topics: Risk Factor Reduction:  -Group instruction that is supported by a PowerPoint presentation. Instructor discusses the definition of a risk factor, different risk factors for pulmonary disease, and how the heart and lungs work together.     Nutrition for Pulmonary Patient:  -Group instruction provided by PowerPoint slides, verbal discussion, and written materials to support subject matter. The instructor gives an explanation and review of healthy diet recommendations, which includes a discussion on weight management, recommendations for fruit and  vegetable consumption, as well as protein, fluid, caffeine, fiber, sodium, sugar, and alcohol. Tips for eating when patients are short of breath are discussed.   Pursed Lip Breathing:  -Group instruction that is supported by demonstration and informational handouts. Instructor discusses the benefits of pursed lip and diaphragmatic breathing and detailed demonstration on how to preform both.     Oxygen Safety:  -Group instruction provided by PowerPoint, verbal discussion, and written material to support subject matter. There is an overview of "What is Oxygen" and "Why do we need it".  Instructor also reviews how to create a safe environment for oxygen use, the importance of using oxygen as prescribed, and the risks of noncompliance. There is a brief discussion on traveling with oxygen and resources the patient may utilize.   Oxygen Equipment:  -Group instruction  provided by [facility] Staff utilizing handouts, written materials, and equipment demonstrations.   Signs and Symptoms:  -Group instruction provided by written material and verbal discussion to support subject matter. Warning signs and symptoms of infection, stroke, and heart attack are reviewed and when to call the physician/911 reinforced. Tips for preventing the spread of infection discussed.   PULMONARY REHAB OTHER RESPIRATORY from 10/16/2016 in [place]  Date  08/07/16  Educator  MD  Instruction Review Code  2- meets goals/outcomes      Advanced Directives:  -Group instruction provided by verbal instruction and written material to support subject matter. Instructor reviews Advanced Directive laws and proper instruction for filling out document.   Pulmonary Video:  -Group video education that reviews the importance of medication and oxygen compliance, exercise, good nutrition, pulmonary hygiene, and pursed lip and diaphragmatic breathing for the pulmonary patient.   PULMONARY REHAB OTHER RESPIRATORY from 10/16/2016 in [place]  Date  10/09/16  Instruction Review Code  R- Review/reinforce      Exercise for the Pulmonary Patient:  -Group instruction that is supported by a PowerPoint presentation. Instructor discusses benefits of exercise, core components of exercise, frequency, duration, and intensity of an exercise routine, importance of utilizing pulse oximetry during exercise, safety while exercising, and options of places to exercise outside of rehab.     PULMONARY REHAB OTHER RESPIRATORY from 10/16/2016 in [place]  Date  10/16/16  Educator  EP  Instruction Review Code  R- Review/reinforce      Pulmonary Medications:  -Verbally interactive group education provided by instructor with focus on inhaled medications and proper administration.   Anatomy and Physiology of the  Respiratory System and Intimacy:  -Group instruction provided by PowerPoint, verbal discussion, and written material to support subject matter. Instructor reviews respiratory cycle and anatomical components of the respiratory system and their functions. Instructor also reviews differences in obstructive and restrictive respiratory diseases with examples of each. Intimacy, Sex, and Sexuality differences are reviewed with a discussion on how relationships can change when diagnosed with pulmonary disease. Common sexual concerns are reviewed.   PULMONARY REHAB OTHER RESPIRATORY from 10/16/2016 in [place]  Date  09/04/16  Educator  RN  Instruction Review Code  2- meets goals/outcomes      Knowledge Questionnaire Score:     Knowledge Questionnaire Score - 10/08/16 0943      Knowledge Questionnaire Score   Pre Score 11/13      Core Components/Risk Factors/Patient Goals at Admission:     Personal Goals and Risk Factors at Admission - 09/29/16 1025  Core Components/Risk Factors/Patient Goals on Admission    Weight Management Yes;Obesity   Intervention Obesity: Provide education and appropriate resources to help participant work on and attain dietary goals.;Weight Management/Obesity: Establish reasonable short term and long term weight goals.;Weight Management: Develop a combined nutrition and exercise program designed to reach desired caloric intake, while maintaining appropriate intake of nutrient and fiber, sodium and fats, and appropriate energy expenditure required for the weight goal.;Weight Management: Provide education and appropriate resources to help participant work on and attain dietary goals.   Expected Outcomes Short Term: Continue to assess and modify interventions until short term weight is achieved   Improve shortness of breath with ADL's Yes   Intervention Provide education, individualized exercise plan and daily activity instruction to help  decrease symptoms of SOB with activities of daily living.   Expected Outcomes Short Term: Achieves a reduction of symptoms when performing activities of daily living.   Develop more efficient breathing techniques such as purse lipped breathing and diaphragmatic breathing; and practicing self-pacing with activity Yes   Intervention Provide education, demonstration and support about specific breathing techniuqes utilized for more efficient breathing. Include techniques such as pursed lipped breathing, diaphragmatic breathing and self-pacing activity.   Expected Outcomes Short Term: Participant will be able to demonstrate and use breathing techniques as needed throughout daily activities.   Stress Yes  recent loss of sister   Intervention Offer individual and/or small group education and counseling on adjustment to heart disease, stress management and health-related lifestyle change. Teach and support self-help strategies.;Refer participants experiencing significant psychosocial distress to appropriate mental health specialists for further evaluation and treatment. When possible, include family members and significant others in education/counseling sessions.   Expected Outcomes Short Term: Participant demonstrates changes in health-related behavior, relaxation and other stress management skills, ability to obtain effective social support, and compliance with psychotropic medications if prescribed.;Long Term: Emotional wellbeing is indicated by absence of clinically significant psychosocial distress or social isolation.      Core Components/Risk Factors/Patient Goals Review:      Goals and Risk Factor Review    Row Name 07/21/16 1042 08/11/16 1558 09/08/16 1031 11/03/16 1634       Core Components/Risk Factors/Patient Goals Review   Personal Goals Review Weight Management/Obesity;Improve shortness of breath with ADL's;Increase Strength and Stamina Weight Management/Obesity;Improve shortness of breath  with ADL's;Increase Strength and Stamina Weight Management/Obesity;Develop more efficient breathing techniques such as purse lipped breathing and diaphragmatic breathing and practicing self-pacing with activity.;Improve shortness of breath with ADL's Weight Management/Obesity;Develop more efficient breathing techniques such as purse lipped breathing and diaphragmatic breathing and practicing self-pacing with activity.;Improve shortness of breath with ADL's    Review  - see comment section on ITP see comment section on ITP see comment section on ITP    Expected Outcomes  - see Admission expected outcomes see Admission expected outcomes see Admission expected outcomes       Core Components/Risk Factors/Patient Goals at Discharge (Final Review):      Goals and Risk Factor Review - 11/03/16 1634      Core Components/Risk Factors/Patient Goals Review   Personal Goals Review Weight Management/Obesity;Develop more efficient breathing techniques such as purse lipped breathing and diaphragmatic breathing and practicing self-pacing with activity.;Improve shortness of breath with ADL's   Review see comment section on ITP   Expected Outcomes see Admission expected outcomes      ITP Comments:   Comments: ITP REVIEW Pt is making expected progress toward most pulmonary rehab goals after completing  7 sessions. She continues to struggle with weight loss and has actually gained almost 2 kg since her admission. Some of this weight gain may stem from eating more "unhealthy" foods while morning the recent loss of her sister. She continues to have periods of sadness but states that it is getting better. She is in the process of cleaning out her sisters home. She states she enjoys coming to pulmonary rehab and feels she is getting stronger. She is tolerating workload increases and utilizes pursed lip breathing technique independently. Recommend continued exercise, life style modification, education, and utilization  of breathing techniques to increase stamina and strength and decrease shortness of breath with exertion.

## 2016-11-04 ENCOUNTER — Other Ambulatory Visit: Payer: Self-pay | Admitting: Family Medicine

## 2016-11-04 ENCOUNTER — Ambulatory Visit
Admission: RE | Admit: 2016-11-04 | Discharge: 2016-11-04 | Disposition: A | Discharge status: Home / Self Care | Payer: Medicare HMO | Source: Ambulatory Visit | Attending: Family Medicine | Admitting: Family Medicine

## 2016-11-04 ENCOUNTER — Encounter (HOSPITAL_COMMUNITY): Payer: Medicare HMO

## 2016-11-04 ENCOUNTER — Encounter (HOSPITAL_COMMUNITY)
Admission: RE | Admit: 2016-11-04 | Discharge: 2016-11-04 | Disposition: A | Discharge status: Home / Self Care | Payer: Medicare HMO | Source: Ambulatory Visit | Attending: Family Medicine | Admitting: Family Medicine

## 2016-11-04 DIAGNOSIS — R921 Mammographic calcification found on diagnostic imaging of breast: Secondary | ICD-10-CM | POA: Diagnosis not present

## 2016-11-04 DIAGNOSIS — N6092 Unspecified benign mammary dysplasia of left breast: Secondary | ICD-10-CM | POA: Diagnosis not present

## 2016-11-04 DIAGNOSIS — N6489 Other specified disorders of breast: Secondary | ICD-10-CM

## 2016-11-06 ENCOUNTER — Encounter (HOSPITAL_COMMUNITY): Payer: Medicare HMO

## 2016-11-06 ENCOUNTER — Encounter (HOSPITAL_COMMUNITY)
Admission: RE | Admit: 2016-11-06 | Discharge: 2016-11-06 | Disposition: A | Discharge status: Home / Self Care | Payer: Medicare HMO | Source: Ambulatory Visit | Attending: Family Medicine | Admitting: Family Medicine

## 2016-11-06 VITALS — Wt 236.3 lb

## 2016-11-06 DIAGNOSIS — R0602 Shortness of breath: Secondary | ICD-10-CM | POA: Diagnosis not present

## 2016-11-06 NOTE — Progress Notes (Signed)
Daily Session Note  Patient Details  Name: Shannon Schultz MRN: [id number] Date of Birth: 1946/04/08 Referring Provider:     Pulmonary Rehab Walk Test from 10/07/2016 in [place]  Referring Provider  Dr. Tomi Bamberger      Encounter Date: 11/06/2016  Check In:     Session Check In - 11/06/16 1030      Check-In   Location [facility]   Staff Present Rosebud Poles, RN, BSN;Aisley Whan Ysidro Evert, RN;Portia Rollene Rotunda, RN, BSN   Supervising physician immediately available to respond to emergencies [facility] immediately available   Physician(s) Dr. Wendee Beavers   Medication changes reported     No   Fall or balance concerns reported    No   Tobacco Cessation No Change   Warm-up and Cool-down Performed as group-led instruction   Resistance Training Performed Yes   VAD Patient? No     Pain Assessment   Currently in Pain? No/denies   Multiple Pain Sites No      Capillary Blood Glucose: No results found for this or any previous visit (from the past 24 hour(s)).     POCT Glucose - 11/06/16 1236      POCT Blood Glucose   Pre-Exercise 168 mg/dL   Post-Exercise 111 mg/dL         Exercise Prescription Changes - 11/06/16 1200      Response to Exercise   Blood Pressure (Admit) 130/72   Blood Pressure (Exercise) 152/80   Blood Pressure (Exit) 122/60   Heart Rate (Admit) 73 bpm   Heart Rate (Exercise) 108 bpm   Heart Rate (Exit) 95 bpm   Oxygen Saturation (Admit) 93 %   Oxygen Saturation (Exercise) 88 %   Oxygen Saturation (Exit) 92 %   Rating of Perceived Exertion (Exercise) 15   Perceived Dyspnea (Exercise) 2   Duration Progress to 45 minutes of aerobic exercise without signs/symptoms of physical distress   Intensity THRR unchanged     Progression   Progression Continue to progress workloads to maintain intensity without signs/symptoms of physical distress.     Resistance Training   Training Prescription Yes   Weight orange bands   Reps  10-15   Time 10 Minutes     Oxygen   Oxygen Continuous   Liters 2     Recumbant Bike   Level 3   Minutes 17     Track   Laps 11   Minutes 17      History  Smoking Status  . Former Smoker  . Packs/day: 1.00  . Years: 46.00  . Types: Cigarettes  . Quit date: 05/31/2011  Smokeless Tobacco  . Never Used    Goals Met:  Exercise tolerated well No report of cardiac concerns or symptoms Strength training completed today  Goals Unmet:  Not Applicable  Comments: Service time is from 1030 to 1230    Dr. Rush Farmer is Medical Director for Pulmonary Rehab at [facility].

## 2016-11-11 ENCOUNTER — Encounter (HOSPITAL_COMMUNITY)
Admission: RE | Admit: 2016-11-11 | Discharge: 2016-11-11 | Disposition: A | Discharge status: Home / Self Care | Payer: Medicare HMO | Source: Ambulatory Visit | Attending: Family Medicine | Admitting: Family Medicine

## 2016-11-11 VITALS — Wt 240.1 lb

## 2016-11-11 DIAGNOSIS — R0602 Shortness of breath: Secondary | ICD-10-CM

## 2016-11-11 NOTE — Progress Notes (Signed)
Daily Session Note  Patient Details  Name: Shannon Schultz MRN: [id number] Date of Birth: 1946/03/16 Referring Provider:     Pulmonary Rehab Walk Test from 10/07/2016 in [place]  Referring Provider  Dr. Tomi Bamberger      Encounter Date: 11/11/2016  Check In:     Session Check In - 11/11/16 1030      Check-In   Location [facility]   Staff Present Rosebud Poles, RN, BSN;Mashonda Broski, MS, ACSM RCEP, Exercise Physiologist;Lisa Ysidro Evert, RN;Portia Rollene Rotunda, RN, BSN   Supervising physician immediately available to respond to emergencies [facility] immediately available   Physician(s) Dr. Wendee Beavers   Medication changes reported     No   Fall or balance concerns reported    No   Tobacco Cessation No Change   Warm-up and Cool-down Performed as group-led instruction   Resistance Training Performed Yes   VAD Patient? No     Pain Assessment   Currently in Pain? No/denies   Multiple Pain Sites No      Capillary Blood Glucose: No results found for this or any previous visit (from the past 24 hour(s)).      Exercise Prescription Changes - 11/11/16 1200      Response to Exercise   Blood Pressure (Admit) 126/64   Blood Pressure (Exercise) 170/80   Blood Pressure (Exit) 122/60   Heart Rate (Admit) 78 bpm   Heart Rate (Exercise) 106 bpm   Heart Rate (Exit) 78 bpm   Oxygen Saturation (Admit) 95 %   Oxygen Saturation (Exercise) 86 %   Oxygen Saturation (Exit) 93 %   Rating of Perceived Exertion (Exercise) 15   Perceived Dyspnea (Exercise) 3   Duration Progress to 45 minutes of aerobic exercise without signs/symptoms of physical distress   Intensity THRR unchanged     Progression   Progression Continue to progress workloads to maintain intensity without signs/symptoms of physical distress.     Resistance Training   Training Prescription Yes   Weight orange bands   Reps 10-15   Time 10 Minutes     Oxygen   Oxygen Continuous   Liters 2     Bike   Level 4   Minutes 17     Recumbant Bike   Level 3   Minutes 17     NuStep   Level 5   Minutes 17   METs 1.9     Track   Laps 8   Minutes 17      History  Smoking Status  . Former Smoker  . Packs/day: 1.00  . Years: 46.00  . Types: Cigarettes  . Quit date: 05/31/2011  Smokeless Tobacco  . Never Used    Goals Met:  Exercise tolerated well No report of cardiac concerns or symptoms Strength training completed today  Goals Unmet:  Not Applicable  Comments: Service time is from 10:30a to 12:15p    Dr. Rush Farmer is Medical Director for Pulmonary Rehab at [facility].

## 2016-11-12 ENCOUNTER — Other Ambulatory Visit: Payer: Self-pay | Admitting: Family Medicine

## 2016-11-13 ENCOUNTER — Encounter (HOSPITAL_COMMUNITY)
Admission: RE | Admit: 2016-11-13 | Discharge: 2016-11-13 | Disposition: A | Discharge status: Home / Self Care | Payer: Medicare HMO | Source: Ambulatory Visit | Attending: Family Medicine | Admitting: Family Medicine

## 2016-11-13 VITALS — Wt 237.2 lb

## 2016-11-13 DIAGNOSIS — R0602 Shortness of breath: Secondary | ICD-10-CM

## 2016-11-13 NOTE — Progress Notes (Signed)
Daily Session Note  Patient Details  Name: Shannon Schultz MRN: [id number] Date of Birth: 14-Feb-1946 Referring Provider:     Pulmonary Rehab Walk Test from 10/07/2016 in [place]  Referring Provider  Dr. Tomi Bamberger      Encounter Date: 11/13/2016  Check In:     Session Check In - 11/13/16 1055      Check-In   Location [facility]   Staff Present Rosebud Poles, RN, BSN;Azrielle Springsteen, MS, ACSM RCEP, Exercise Physiologist;Lisa Ysidro Evert, RN   Supervising physician immediately available to respond to emergencies [facility] immediately available   Physician(s) Dr. Broadus John   Medication changes reported     No   Fall or balance concerns reported    No   Tobacco Cessation No Change   Warm-up and Cool-down Performed as group-led instruction   Resistance Training Performed Yes   VAD Patient? No     Pain Assessment   Currently in Pain? No/denies   Multiple Pain Sites No      Capillary Blood Glucose: No results found for this or any previous visit (from the past 24 hour(s)).      Exercise Prescription Changes - 11/13/16 1200      Response to Exercise   Blood Pressure (Admit) 120/64   Blood Pressure (Exercise) 152/70   Blood Pressure (Exit) 124/70   Heart Rate (Admit) 74 bpm   Heart Rate (Exercise) 101 bpm   Heart Rate (Exit) 71 bpm   Oxygen Saturation (Admit) 94 %   Oxygen Saturation (Exercise) 91 %   Oxygen Saturation (Exit) 94 %   Rating of Perceived Exertion (Exercise) 12   Perceived Dyspnea (Exercise) 2   Duration Progress to 45 minutes of aerobic exercise without signs/symptoms of physical distress   Intensity THRR unchanged     Progression   Progression Continue to progress workloads to maintain intensity without signs/symptoms of physical distress.     Resistance Training   Training Prescription Yes   Weight orange bands   Reps 10-15   Time 10 Minutes     Oxygen   Oxygen Continuous   Liters 2     Bike   Level 4   Minutes 17     NuStep   Level 6   Minutes 17   METs 2.1      History  Smoking Status  . Former Smoker  . Packs/day: 1.00  . Years: 46.00  . Types: Cigarettes  . Quit date: 05/31/2011  Smokeless Tobacco  . Never Used    Goals Met:  Exercise tolerated well No report of cardiac concerns or symptoms Strength training completed today  Goals Unmet:  Not Applicable Comments: Service time is from 10:30a to 12:20p     Dr. Rush Farmer is Medical Director for Pulmonary Rehab at [facility].

## 2016-11-18 ENCOUNTER — Encounter (HOSPITAL_COMMUNITY)
Admission: RE | Admit: 2016-11-18 | Discharge: 2016-11-18 | Disposition: A | Discharge status: Home / Self Care | Payer: Medicare HMO | Source: Ambulatory Visit | Attending: Family Medicine | Admitting: Family Medicine

## 2016-11-18 VITALS — Wt 240.1 lb

## 2016-11-18 DIAGNOSIS — R0602 Shortness of breath: Secondary | ICD-10-CM

## 2016-11-18 NOTE — Progress Notes (Signed)
Daily Session Note  Patient Details  Name: Shannon Schultz MRN: [id number] Date of Birth: [birth year] Referring Provider:     Pulmonary Rehab Walk Test from 10/07/2016 in [place]  Referring Provider  Dr. Tomi Bamberger      Encounter Date: 11/18/2016  Check In:     Session Check In - 11/18/16 1030      Check-In   Location [facility]   Staff Present Rosebud Poles, RN, BSN;Molly diVincenzo, MS, ACSM RCEP, Exercise Physiologist;Lisa Ysidro Evert, RN;Portia Rollene Rotunda, RN, BSN   Supervising physician immediately available to respond to emergencies [facility] immediately available   Physician(s) Dr. Broadus John   Medication changes reported     No   Fall or balance concerns reported    No   Tobacco Cessation No Change   Warm-up and Cool-down Performed as group-led instruction   Resistance Training Performed Yes   VAD Patient? No     Pain Assessment   Currently in Pain? No/denies   Multiple Pain Sites No      Capillary Blood Glucose: No results found for this or any previous visit (from the past 24 hour(s)).     POCT Glucose - 11/18/16 1226      POCT Blood Glucose   Pre-Exercise 187 mg/dL   Post-Exercise 99 mg/dL         Exercise Prescription Changes - 11/18/16 1200      Response to Exercise   Blood Pressure (Admit) 108/54   Blood Pressure (Exercise) 170/84   Blood Pressure (Exit) 114/60   Heart Rate (Admit) 65 bpm   Heart Rate (Exercise) 101 bpm   Heart Rate (Exit) 72 bpm   Oxygen Saturation (Admit) 94 %   Oxygen Saturation (Exercise) 88 %   Oxygen Saturation (Exit) 93 %   Rating of Perceived Exertion (Exercise) 13   Perceived Dyspnea (Exercise) 3   Duration Progress to 45 minutes of aerobic exercise without signs/symptoms of physical distress   Intensity THRR unchanged     Progression   Progression Continue to progress workloads to maintain intensity without signs/symptoms of physical distress.     Resistance Training   Training Prescription Yes   Weight orange bands   Reps 10-15   Time 10 Minutes     Interval Training   Interval Training No     Oxygen   Oxygen Continuous   Liters 2     Recumbant Bike   Level 4   Minutes 17     NuStep   Level 6   Minutes 17   METs 2.1     Track   Laps 10   Minutes 17      History  Smoking Status  . Former Smoker  . Packs/day: 1.00  . Years: 46.00  . Types: Cigarettes  . Quit date: 05/31/2011  Smokeless Tobacco  . Never Used    Goals Met:  Exercise tolerated well Strength training completed today  Goals Unmet:  Not Applicable  Comments: Service time is from 1030 to 1200    Dr. Rush Farmer is Medical Director for Pulmonary Rehab at [facility].

## 2016-11-20 ENCOUNTER — Encounter (HOSPITAL_COMMUNITY)
Admission: RE | Admit: 2016-11-20 | Discharge: 2016-11-20 | Disposition: A | Discharge status: Home / Self Care | Payer: Medicare HMO | Source: Ambulatory Visit | Attending: Family Medicine | Admitting: Family Medicine

## 2016-11-20 VITALS — Wt 239.0 lb

## 2016-11-20 DIAGNOSIS — R0602 Shortness of breath: Secondary | ICD-10-CM

## 2016-11-20 NOTE — Progress Notes (Signed)
Daily Session Note  Patient Details  Name: Shannon Schultz MRN: [id number] Date of Birth: 1946/04/22 Referring Provider:     Pulmonary Rehab Walk Test from 10/07/2016 in [place]  Referring Provider  Dr. Tomi Bamberger      Encounter Date: 11/20/2016  Check In:     Session Check In - 11/20/16 1030      Check-In   Location [facility]   Staff Present Trish Fountain, RN, BSN;Ramon Dredge, RN, MHA;Nai Dasch Leonia Reeves, RN, BSN   Supervising physician immediately available to respond to emergencies [facility] immediately available   Physician(s) Dr. Karleen Hampshire   Medication changes reported     No   Fall or balance concerns reported    No   Tobacco Cessation No Change   Warm-up and Cool-down Performed as group-led instruction   Resistance Training Performed Yes   VAD Patient? No     Pain Assessment   Currently in Pain? No/denies   Multiple Pain Sites No      Capillary Blood Glucose: No results found for this or any previous visit (from the past 24 hour(s)).     POCT Glucose - 11/20/16 1250      POCT Blood Glucose   Pre-Exercise 160 mg/dL   Post-Exercise 109 mg/dL         Exercise Prescription Changes - 11/20/16 1200      Response to Exercise   Blood Pressure (Admit) 102/60   Blood Pressure (Exercise) 120/70   Blood Pressure (Exit) 134/70   Heart Rate (Admit) 66 bpm   Heart Rate (Exercise) 104 bpm   Heart Rate (Exit) 80 bpm   Oxygen Saturation (Admit) 95 %   Oxygen Saturation (Exercise) 91 %   Oxygen Saturation (Exit) 94 %   Rating of Perceived Exertion (Exercise) 15   Perceived Dyspnea (Exercise) 3   Duration Progress to 45 minutes of aerobic exercise without signs/symptoms of physical distress   Intensity THRR unchanged     Progression   Progression Continue to progress workloads to maintain intensity without signs/symptoms of physical distress.     Recumbant Bike   Level 4   Minutes 17     Track   Laps 14   Minutes 17      History  Smoking Status  . Former Smoker  . Packs/day: 1.00  . Years: 46.00  . Types: Cigarettes  . Quit date: 05/31/2011  Smokeless Tobacco  . Never Used    Goals Met:  Exercise tolerated well Strength training completed today  Goals Unmet:  Not Applicable  Comments: Service time is from 1030 to 1215    Dr. Rush Farmer is Medical Director for Pulmonary Rehab at [facility].

## 2016-11-25 ENCOUNTER — Encounter (HOSPITAL_COMMUNITY)
Admission: RE | Admit: 2016-11-25 | Discharge: 2016-11-25 | Disposition: A | Discharge status: Home / Self Care | Payer: Medicare HMO | Source: Ambulatory Visit | Attending: Family Medicine | Admitting: Family Medicine

## 2016-11-25 VITALS — Wt 238.8 lb

## 2016-11-25 DIAGNOSIS — R0602 Shortness of breath: Secondary | ICD-10-CM | POA: Diagnosis not present

## 2016-11-25 NOTE — Progress Notes (Signed)
Daily Session Note  Patient Details  Name: Shannon Schultz MRN: [id number] Date of Birth: 07/04/1945 Referring Provider:     Pulmonary Rehab Walk Test from 10/07/2016 in [place]  Referring Provider  Dr. Tomi Bamberger      Encounter Date: 11/25/2016  Check In:     Session Check In - 11/25/16 1021      Check-In   Location [facility]   Staff Present Su Hilt, MS, ACSM RCEP, Exercise Physiologist;Puneet Selden Leonia Reeves, RN, Luisa Hart, RN, BSN   Supervising physician immediately available to respond to emergencies [facility] immediately available   Physician(s) Dr. Karleen Hampshire   Medication changes reported     No   Fall or balance concerns reported    No   Tobacco Cessation No Change   Warm-up and Cool-down Performed as group-led instruction   Resistance Training Performed Yes   VAD Patient? No     Pain Assessment   Currently in Pain? No/denies   Multiple Pain Sites No      Capillary Blood Glucose: No results found for this or any previous visit (from the past 24 hour(s)).     POCT Glucose - 11/25/16 1232      POCT Blood Glucose   Pre-Exercise 148 mg/dL   Post-Exercise 106 mg/dL         Exercise Prescription Changes - 11/25/16 1200      Response to Exercise   Blood Pressure (Admit) 124/76   Blood Pressure (Exercise) 122/70   Blood Pressure (Exit) 104/62   Heart Rate (Admit) 69 bpm   Heart Rate (Exercise) 114 bpm   Heart Rate (Exit) 75 bpm   Oxygen Saturation (Admit) 93 %   Oxygen Saturation (Exercise) 88 %   Oxygen Saturation (Exit) 94 %   Rating of Perceived Exertion (Exercise) 14   Perceived Dyspnea (Exercise) 3   Duration Progress to 45 minutes of aerobic exercise without signs/symptoms of physical distress   Intensity THRR unchanged     Progression   Progression Continue to progress workloads to maintain intensity without signs/symptoms of physical distress.     Resistance Training   Training  Prescription Yes   Weight orange bands   Reps 10-15   Time 10 Minutes     Interval Training   Interval Training No     Oxygen   Oxygen Continuous   Liters 2     Recumbant Bike   Level 4   Minutes 17     NuStep   Level 6   Minutes 17   METs 2.1     Track   Laps 12   Minutes 17      History  Smoking Status  . Former Smoker  . Packs/day: 1.00  . Years: 46.00  . Types: Cigarettes  . Quit date: 05/31/2011  Smokeless Tobacco  . Never Used    Goals Met:  Exercise tolerated well Strength training completed today  Goals Unmet:  Not Applicable  Comments: Service time is from 1030 to 1205    Dr. Rush Farmer is Medical Director for Pulmonary Rehab at [facility].

## 2016-11-26 ENCOUNTER — Ambulatory Visit: Payer: Self-pay | Admitting: General Surgery

## 2016-11-26 DIAGNOSIS — N6092 Unspecified benign mammary dysplasia of left breast: Secondary | ICD-10-CM | POA: Diagnosis not present

## 2016-11-27 ENCOUNTER — Other Ambulatory Visit: Payer: Self-pay | Admitting: General Surgery

## 2016-11-27 ENCOUNTER — Encounter (HOSPITAL_COMMUNITY)
Admission: RE | Admit: 2016-11-27 | Discharge: 2016-11-27 | Disposition: A | Discharge status: Home / Self Care | Payer: Medicare HMO | Source: Ambulatory Visit | Attending: Family Medicine | Admitting: Family Medicine

## 2016-11-27 VITALS — Wt 240.5 lb

## 2016-11-27 DIAGNOSIS — R0602 Shortness of breath: Secondary | ICD-10-CM

## 2016-11-27 DIAGNOSIS — K469 Unspecified abdominal hernia without obstruction or gangrene: Secondary | ICD-10-CM

## 2016-11-27 NOTE — Progress Notes (Signed)
Daily Session Note  Patient Details  Name: Shannon Schultz MRN: [id number] Date of Birth: February 04, 1946 Referring Provider:     Pulmonary Rehab Walk Test from 10/07/2016 in [place]  Referring Provider  Dr. Tomi Bamberger      Encounter Date: 11/27/2016  Check In:     Session Check In - 11/27/16 1016      Check-In   Location [facility]   Staff Present Su Hilt, MS, ACSM RCEP, Exercise Physiologist;Portia Rollene Rotunda, RN, BSN   Supervising physician immediately available to respond to emergencies [facility] immediately available   Physician(s) Dr. Broadus John   Medication changes reported     No   Fall or balance concerns reported    No   Tobacco Cessation No Change   Warm-up and Cool-down Performed as group-led instruction   Resistance Training Performed Yes   VAD Patient? No     Pain Assessment   Currently in Pain? No/denies   Multiple Pain Sites No      Capillary Blood Glucose: No results found for this or any previous visit (from the past 24 hour(s)).      Exercise Prescription Changes - 11/27/16 1200      Response to Exercise   Blood Pressure (Admit) 120/54   Blood Pressure (Exercise) 154/64   Blood Pressure (Exit) 110/72   Heart Rate (Admit) 77 bpm   Heart Rate (Exercise) 108 bpm   Heart Rate (Exit) 80 bpm   Oxygen Saturation (Admit) 95 %   Oxygen Saturation (Exercise) 95 %   Oxygen Saturation (Exit) 95 %   Rating of Perceived Exertion (Exercise) 13   Perceived Dyspnea (Exercise) 2   Duration Progress to 45 minutes of aerobic exercise without signs/symptoms of physical distress   Intensity THRR unchanged     Progression   Progression Continue to progress workloads to maintain intensity without signs/symptoms of physical distress.     Resistance Training   Training Prescription Yes   Weight orange bands   Reps 10-15   Time 10 Minutes     Interval Training   Interval Training No     Oxygen   Oxygen  Continuous   Liters 2     Bike   Level 4   Minutes 17     NuStep   Level 6   Minutes 17   METs 2.1      History  Smoking Status  . Former Smoker  . Packs/day: 1.00  . Years: 46.00  . Types: Cigarettes  . Quit date: 05/31/2011  Smokeless Tobacco  . Never Used    Goals Met:  Exercise tolerated well No report of cardiac concerns or symptoms Strength training completed today  Goals Unmet:  Not Applicable  Comments: Service time is from 10:30a to 12:20p    Dr. Rush Farmer is Medical Director for Pulmonary Rehab at [facility].

## 2016-12-01 ENCOUNTER — Ambulatory Visit
Admission: RE | Admit: 2016-12-01 | Discharge: 2016-12-01 | Disposition: A | Discharge status: Home / Self Care | Payer: Medicare HMO | Source: Ambulatory Visit | Attending: General Surgery | Admitting: General Surgery

## 2016-12-01 DIAGNOSIS — R1031 Right lower quadrant pain: Secondary | ICD-10-CM | POA: Diagnosis not present

## 2016-12-01 DIAGNOSIS — K469 Unspecified abdominal hernia without obstruction or gangrene: Secondary | ICD-10-CM

## 2016-12-02 ENCOUNTER — Encounter (HOSPITAL_COMMUNITY)
Admission: RE | Admit: 2016-12-02 | Discharge: 2016-12-02 | Disposition: A | Discharge status: Home / Self Care | Payer: Medicare HMO | Source: Ambulatory Visit | Attending: Family Medicine | Admitting: Family Medicine

## 2016-12-02 ENCOUNTER — Other Ambulatory Visit: Payer: Self-pay | Admitting: General Surgery

## 2016-12-02 DIAGNOSIS — R0602 Shortness of breath: Secondary | ICD-10-CM | POA: Insufficient documentation

## 2016-12-02 DIAGNOSIS — N6092 Unspecified benign mammary dysplasia of left breast: Secondary | ICD-10-CM

## 2016-12-02 NOTE — Progress Notes (Signed)
Daily Session Note  Patient Details  Name: Shannon Schultz MRN: [id number] Date of Birth: [birth year] Referring Provider:     Pulmonary Rehab Walk Test from 10/07/2016 in [place]  Referring Provider  Dr. Tomi Bamberger      Encounter Date: 12/02/2016  Check In:     Session Check In - 12/02/16 1047      Check-In   Location [facility]   Staff Present Trish Fountain, RN, Maxcine Ham, RN, BSN;Molly diVincenzo, MS, ACSM RCEP, Exercise Physiologist;Monetta Lick Ysidro Evert, RN   Supervising physician immediately available to respond to emergencies [facility] immediately available   Physician(s) Dr. Broadus John   Medication changes reported     No   Fall or balance concerns reported    No   Tobacco Cessation No Change   Warm-up and Cool-down Performed as group-led instruction   Resistance Training Performed Yes   VAD Patient? No     Pain Assessment   Currently in Pain? No/denies   Multiple Pain Sites No      Capillary Blood Glucose: No results found for this or any previous visit (from the past 24 hour(s)).     POCT Glucose - 12/02/16 1227      POCT Blood Glucose   Pre-Exercise 159 mg/dL   Post-Exercise 103 mg/dL         Exercise Prescription Changes - 12/02/16 1200      Response to Exercise   Blood Pressure (Admit) 110/60   Blood Pressure (Exercise) 130/72   Blood Pressure (Exit) 110/70   Heart Rate (Admit) 82 bpm   Heart Rate (Exercise) 105 bpm   Heart Rate (Exit) 83 bpm   Oxygen Saturation (Admit) 92 %   Oxygen Saturation (Exercise) 91 %   Oxygen Saturation (Exit) 93 %   Rating of Perceived Exertion (Exercise) 12   Perceived Dyspnea (Exercise) 2   Duration Progress to 45 minutes of aerobic exercise without signs/symptoms of physical distress   Intensity THRR unchanged     Progression   Progression Continue to progress workloads to maintain intensity without signs/symptoms of physical distress.     Resistance Training   Training Prescription Yes   Weight orange bands   Reps 10-15   Time 10 Minutes     Interval Training   Interval Training No     Oxygen   Oxygen Continuous   Liters 2     Bike   Level 4   Minutes 17     NuStep   Level 6   Minutes 17   METs 2.1     Track   Laps 10   Minutes 17      History  Smoking Status  . Former Smoker  . Packs/day: 1.00  . Years: 46.00  . Types: Cigarettes  . Quit date: 05/31/2011  Smokeless Tobacco  . Never Used    Goals Met:  Exercise tolerated well No report of cardiac concerns or symptoms Strength training completed today  Goals Unmet:  Not Applicable  Comments: Service time is from 1030 to 1210    Dr. Rush Farmer is Medical Director for Pulmonary Rehab at [facility].

## 2016-12-03 DIAGNOSIS — J441 Chronic obstructive pulmonary disease with (acute) exacerbation: Secondary | ICD-10-CM | POA: Diagnosis not present

## 2016-12-03 DIAGNOSIS — J449 Chronic obstructive pulmonary disease, unspecified: Secondary | ICD-10-CM | POA: Diagnosis not present

## 2016-12-04 ENCOUNTER — Encounter: Payer: Self-pay | Admitting: Family Medicine

## 2016-12-04 ENCOUNTER — Encounter (HOSPITAL_COMMUNITY): Payer: Self-pay

## 2016-12-04 ENCOUNTER — Encounter (HOSPITAL_COMMUNITY): Payer: Medicare HMO

## 2016-12-04 ENCOUNTER — Ambulatory Visit (INDEPENDENT_AMBULATORY_CARE_PROVIDER_SITE_OTHER): Payer: Medicare HMO | Admitting: Family Medicine

## 2016-12-04 ENCOUNTER — Telehealth: Payer: Self-pay

## 2016-12-04 VITALS — BP 140/80 | HR 88 | Temp 99.1°F | Ht 64.0 in | Wt 237.4 lb

## 2016-12-04 DIAGNOSIS — N3289 Other specified disorders of bladder: Secondary | ICD-10-CM | POA: Insufficient documentation

## 2016-12-04 DIAGNOSIS — J449 Chronic obstructive pulmonary disease, unspecified: Secondary | ICD-10-CM

## 2016-12-04 DIAGNOSIS — N6099 Unspecified benign mammary dysplasia of unspecified breast: Secondary | ICD-10-CM

## 2016-12-04 DIAGNOSIS — N6092 Unspecified benign mammary dysplasia of left breast: Secondary | ICD-10-CM | POA: Insufficient documentation

## 2016-12-04 DIAGNOSIS — R0602 Shortness of breath: Secondary | ICD-10-CM

## 2016-12-04 DIAGNOSIS — H6692 Otitis media, unspecified, left ear: Secondary | ICD-10-CM

## 2016-12-04 MED ORDER — AMOXICILLIN-POT CLAVULANATE 875-125 MG PO TABS: 1.0000 | ORAL_TABLET | Freq: Two times a day (BID) | ORAL | 0 refills | Status: DC

## 2016-12-04 NOTE — Progress Notes (Signed)
Chief Complaint  Patient presents with  . Cough    started last night. Has some ST and sinus pressure. HA. No discolored mucus yet. Left ear is killing her.    Last night she started with sore throat and cough.  This morning she also noted left ear pain, headache. Some wheezing today and low grade fever also noted (100.2 at home).  +mild nasal congestion, no drainage.  She continues to use flonase at night.  Cough is dry, nonproductive.    Using trelegy daily (causes some hoarseness); Breathing has been good, has not needed to use albuterol yet. Feels like the tightness is more in her throat, not in chest yet.  Just slight wheezing.  +sick contact--husband was sick 1-2 weeks ago, required treatment with z-pak.  Sister also with similar illness, with her this past weekend.  6/25--scheduled for breast surgery with Dr. Marlou Starks. Biopsy showed: ATYPICAL DUCTAL HYPERPLASIA INVOLVING A COMPLEX SCLEROSING LESION. When seeing Dr. Marlou Starks, they also addressed her ventral hernia.  He sent he for CT, which she reports showed a mass on her bladder.  CT report: IMPRESSION: 1. Interval development of cirrhotic changes in the liver. 2. 14 mm polypoid lesion posterior right bladder wall, highly suspicious for urothelial neoplasm. 3. Complex constellation of ventral fascial defects, the dominant hernia is right paraumbilical containing small bowel without complicating features. These results will be called to the ordering clinician or representative by the Radiologist Assistant, and communication documented in the PACS or zVision Dashboard.  Has appointment with urologist on Tuesday.  PMH, PSH, SH reviewed  Outpatient Encounter Prescriptions as of 12/04/2016  Medication Sig Note  . acetaminophen (TYLENOL) 325 MG tablet Take 650 mg by mouth every 6 (six) hours as needed for mild pain, fever or headache. Reported on 10/08/2015 12/04/2016: Last dose 9am  . aspirin EC 81 MG tablet Take 81 mg by mouth daily.     .  bisoprolol-hydrochlorothiazide (ZIAC) 10-6.25 MG tablet TAKE 1 TABLET EVERY DAY   . Calcium Carbonate-Vitamin D (CALCIUM 600+D) 600-400 MG-UNIT per tablet Take 1 tablet by mouth daily.    . cetirizine (KLS ALLER-TEC) 10 MG tablet Take 10 mg by mouth daily.   . Cholecalciferol (VITAMIN D) 2000 UNITS tablet Take 4,000 Units by mouth daily.   . fluticasone (FLONASE) 50 MCG/ACT nasal spray Place 2 sprays into both nostrils daily.   . Fluticasone-Umeclidin-Vilant (TRELEGY ELLIPTA) 100-62.5-25 MCG/INH AEPB Inhale 1 puff into the lungs daily.   . metFORMIN (GLUCOPHAGE-XR) 500 MG 24 hr tablet TAKE 1 TABLET THREE TIMES DAILY   . Multiple Vitamins-Minerals (CENTRUM SILVER PO) Take 1 tablet by mouth.     . simvastatin (ZOCOR) 20 MG tablet TAKE 1 TABLET AT BEDTIME   . TRUEPLUS LANCETS 33G MISC TEST ONE TIME DAILY   . valACYclovir (VALTREX) 500 MG tablet TAKE 1 TABLET EVERY DAY   . albuterol (PROVENTIL) (2.5 MG/3ML) 0.083% nebulizer solution USE 1 VIAL BY NEBULIZATION EVERY FOUR HOURS AS NEEDED FOR WHEEZING OR SHORTNESS OF BREATH. (Patient not taking: Reported on 09/29/2016)   . amoxicillin-clavulanate (AUGMENTIN) 875-125 MG tablet Take 1 tablet by mouth 2 (two) times daily.   . furosemide (LASIX) 20 MG tablet TAKE 1 TABLET BY MOUTH ONLY IF NEEDED FOR SWELLING (Patient not taking: Reported on 09/29/2016)   . guaiFENesin (MUCINEX) 600 MG 12 hr tablet Take 600 mg by mouth 2 (two) times daily.   Marland Kitchen PROAIR HFA 108 (90 Base) MCG/ACT inhaler INHALE 2 PUFFS INTO THE LUNGS EVERY 6 (SIX)  HOURS AS NEEDED FOR WHEEZING. (Patient not taking: Reported on 09/29/2016)    No facility-administered encounter medications on file as of 12/04/2016.    Allergies  Allergen Reactions  . Lisinopril Shortness Of Breath and Rash  . Contrast Media [Iodinated Diagnostic Agents] Other (See Comments)    Could not breath  . Iohexol Other (See Comments)    Immediately could not breathe  . Latex   . Levofloxacin Other (See Comments)    insomnia   . Sulfa Antibiotics Other (See Comments)    Historical from mother, pt states that mother says she almost died from this drug  . Codeine Nausea Only, Anxiety and Other (See Comments)    insomnia  . Lipitor [Atorvastatin Calcium] Rash   (Augmentin rx'd today, not prior to visit).  Allergies  Allergen Reactions  . Lisinopril Shortness Of Breath and Rash  . Contrast Media [Iodinated Diagnostic Agents] Other (See Comments)    Could not breath  . Iohexol Other (See Comments)    Immediately could not breathe  . Latex   . Levofloxacin Other (See Comments)    insomnia  . Sulfa Antibiotics Other (See Comments)    Historical from mother, pt states that mother says she almost died from this drug  . Codeine Nausea Only, Anxiety and Other (See Comments)    insomnia  . Lipitor [Atorvastatin Calcium] Rash   ROS:  Fever and URI symptoms per HPI.  No headaches, dizziness, hearing loss, chest pain, shortness of breath, nausea, vomiting, bowel changes, bleeding, bruising, rash. Denies urinary complaints.  PHYSICAL EXAM:  BP 140/80 (BP Location: Left Arm, Patient Position: Sitting, Cuff Size: Normal)   Pulse 88   Temp 99.1 F (37.3 C) (Tympanic)   Ht 5\' 4"  (1.626 m)   Wt 237 lb 6.4 oz (107.7 kg)   LMP  (LMP Unknown)   SpO2 97%   BMI 40.75 kg/m    Well appearing, pleasant female in no distress HEENT: PERRL, conjunctiva and sclera are clear.   Left TM--inferiorly was normal.  Superiorly (and partially obscured from view by cerumen) did appear quite red. EAC normal no swelling or exudate.  No pain with movement of external left ear. Right TM and EAC normal Nasal mucosa is only minimally edematous, no erythema or drainage. Sinuses nontender. OP is clear Neck: no lymphadenopathy or mass Heart: regular rate and rhythm Lungs: Decreased breath sounds diffusely. No wheezes, rales, ronchi Extremities: no edema Skin: normal turgor, no rash Psych: normal mood, affect, hygiene and grooming Neuro:  alert and oriented, normal gait, cranial nerves, gait   ASSESSMENT/PLAN:  Left otitis media, unspecified otitis media type - Plan: amoxicillin-clavulanate (AUGMENTIN) 875-125 MG tablet  Atypical ductal hyperplasia of breast - scheduled for excision later this month  Bladder mass - 14 mm polypoid lesion posterior right bladder wall, highlysuspicious for urothelial neoplasm--noted on CT. Has appt with urologist  COPD without exacerbation ([place]) - continue current regimen, albuterol prn  Discussed all of her upcoming plans doctors she is seeing. Scans/notes/lab results reviewed   Take the antibiotics twice daily for 10 days.  Use a probiotic if you develop any diarrhea. Use mucinex (guaifenesin) to help keep any mucus or phlegm thin. It might move further down into your chest.  You may use a DM version of mucinex or a separate delsym syrup if needed as a cough suppressant. Use the albuterol if needed for any shortness of breath or wheezing.

## 2016-12-04 NOTE — Telephone Encounter (Signed)
Records faxed to [facility] per [facility] doctor request as they are seeing patient for consult on Tuesday. Victorino December

## 2016-12-04 NOTE — Patient Instructions (Signed)
  Take the antibiotics twice daily for 10 days.  Use a probiotic if you develop any diarrhea. Use mucinex (guaifenesin) to help keep any mucus or phlegm thin. It might move further down into your chest.  You may use a DM version of mucinex or a separate delsym syrup if needed as a cough suppressant. Use the albuterol if needed for any shortness of breath or wheezing.

## 2016-12-04 NOTE — Progress Notes (Signed)
Pulmonary Individual Treatment Plan  Patient Details  Name: Shannon Schultz MRN: [id number] Date of Birth: [birth year] Referring Provider:     Pulmonary Rehab Walk Test from 10/07/2016 in [place]  Referring Provider  Dr. Tomi Bamberger      Initial Encounter Date:    Pulmonary Rehab Walk Test from 10/07/2016 in [place]  Date  10/07/16  Referring Provider  Dr. Tomi Bamberger      Visit Diagnosis: Shortness of breath  Patient's Home Medications on Admission:   Current Outpatient Prescriptions:  .  acetaminophen (TYLENOL) 325 MG tablet, Take 650 mg by mouth every 6 (six) hours as needed for mild pain, fever or headache. Reported on 10/08/2015, Disp: , Rfl:  .  albuterol (PROVENTIL) (2.5 MG/3ML) 0.083% nebulizer solution, USE 1 VIAL BY NEBULIZATION EVERY FOUR HOURS AS NEEDED FOR WHEEZING OR SHORTNESS OF BREATH. (Patient not taking: Reported on 09/29/2016), Disp: 270 mL, Rfl: 0 .  amoxicillin-clavulanate (AUGMENTIN) 875-125 MG tablet, Take 1 tablet by mouth 2 (two) times daily., Disp: 20 tablet, Rfl: 0 .  aspirin EC 81 MG tablet, Take 81 mg by mouth daily.  , Disp: , Rfl:  .  bisoprolol-hydrochlorothiazide (ZIAC) 10-6.25 MG tablet, TAKE 1 TABLET EVERY DAY, Disp: 90 tablet, Rfl: 1 .  Calcium Carbonate-Vitamin D (CALCIUM 600+D) 600-400 MG-UNIT per tablet, Take 1 tablet by mouth daily. , Disp: , Rfl:  .  cetirizine (KLS ALLER-TEC) 10 MG tablet, Take 10 mg by mouth daily., Disp: , Rfl:  .  Cholecalciferol (VITAMIN D) 2000 UNITS tablet, Take 4,000 Units by mouth daily., Disp: , Rfl:  .  fluticasone (FLONASE) 50 MCG/ACT nasal spray, Place 2 sprays into both nostrils daily., Disp: 48 g, Rfl: 3 .  Fluticasone-Umeclidin-Vilant (TRELEGY ELLIPTA) 100-62.5-25 MCG/INH AEPB, Inhale 1 puff into the lungs daily., Disp: 180 each, Rfl: 3 .  furosemide (LASIX) 20 MG tablet, TAKE 1 TABLET BY MOUTH ONLY IF NEEDED FOR SWELLING (Patient not taking: Reported on  09/29/2016), Disp: 30 tablet, Rfl: 2 .  guaiFENesin (MUCINEX) 600 MG 12 hr tablet, Take 600 mg by mouth 2 (two) times daily., Disp: , Rfl:  .  metFORMIN (GLUCOPHAGE-XR) 500 MG 24 hr tablet, TAKE 1 TABLET THREE TIMES DAILY, Disp: 270 tablet, Rfl: 1 .  Multiple Vitamins-Minerals (CENTRUM SILVER PO), Take 1 tablet by mouth.  , Disp: , Rfl:  .  PROAIR HFA 108 (90 Base) MCG/ACT inhaler, INHALE 2 PUFFS INTO THE LUNGS EVERY 6 (SIX) HOURS AS NEEDED FOR WHEEZING. (Patient not taking: Reported on 09/29/2016), Disp: 54 g, Rfl: 0 .  simvastatin (ZOCOR) 20 MG tablet, TAKE 1 TABLET AT BEDTIME, Disp: 90 tablet, Rfl: 0 .  TRUEPLUS LANCETS 33G MISC, TEST ONE TIME DAILY, Disp: 100 each, Rfl: 3 .  valACYclovir (VALTREX) 500 MG tablet, TAKE 1 TABLET EVERY DAY, Disp: 90 tablet, Rfl: 3  Past Medical History: Past Medical History:  Diagnosis Date  . Allergy   . Cancer ([place]) 1992   R breast, DCIS  . Colon polyp   . COPD (chronic obstructive pulmonary disease) ([place])   . Diverticulosis   . Elevated cholesterol   . Elevated liver enzymes    fatty liver per ultrasound per pt  . Family history of malignant neoplasm of breast   . FHx: BRCA2 gene positive    sister with BRCA2 mutation (pt tested NEGATIVE)  . Genital HSV    gets on hip  . Heart murmur     echo 05/2011  mild aortic stenosis  . HSV (herpes simplex virus) infection    on hip--on daily suppression  . Hypertension   . Impaired glucose tolerance   . Migraine   . Osteopenia   . Pneumonia 06/06/2011  . Type 2 diabetes mellitus ([place])   . Vitamin D deficiency disease     Tobacco Use: History  Smoking Status  . Former Smoker  . Packs/day: 1.00  . Years: 46.00  . Types: Cigarettes  . Quit date: 05/31/2011  Smokeless Tobacco  . Never Used    Labs: Recent Review Flowsheet Data    Labs for ITP Cardiac and Pulmonary Rehab Latest Ref Rng & Units 01/04/2015 07/19/2015 11/08/2015 02/11/2016 08/18/2016   Cholestrol <200 mg/dL 110 124(L) - - 113   LDLCALC <100  mg/dL 47 57 - - 43   HDL >50 mg/dL 44(L) 50 - - 51   Trlycerides <150 mg/dL 96 84 - - 93   Hemoglobin A1c <5.7 % 6.7(H) 7.2(H) 6.3 5.6 6.1(H)   PHART 7.350 - 7.400 - - - - -   PCO2ART 35.0 - 45.0 mmHg - - - - -   HCO3 20.0 - 24.0 mEq/L - - - - -   TCO2 0 - 100 mmol/L - - - - -   O2SAT % - - - - -      Capillary Blood Glucose: Lab Results  Component Value Date   GLUCAP 304 (H) 08/18/2015   GLUCAP 222 (H) 08/18/2015   GLUCAP 237 (H) 08/17/2015   GLUCAP 167 (H) 08/17/2015   GLUCAP 258 (H) 08/17/2015       POCT Glucose    Row Name 10/14/16 1225 10/21/16 1203 10/23/16 1258 10/28/16 1210 11/06/16 1236     POCT Blood Glucose   Pre-Exercise 169 mg/dL 169 mg/dL 178 mg/dL 148 mg/dL 168 mg/dL   Post-Exercise 94 mg/dL 105 mg/dL 119 mg/dL 92 mg/dL  banana 111 mg/dL   Row Name 11/11/16 1227 11/13/16 1233 11/18/16 1226 11/20/16 1250 11/25/16 1232     POCT Blood Glucose   Pre-Exercise 168 mg/dL 148 mg/dL 187 mg/dL 160 mg/dL 148 mg/dL   Post-Exercise 95 mg/dL 108 mg/dL 99 mg/dL 109 mg/dL 106 mg/dL   Row Name 11/27/16 1230 12/02/16 1227           POCT Blood Glucose   Pre-Exercise 195 mg/dL 159 mg/dL      Post-Exercise 144 mg/dL 103 mg/dL         ADL UCSD:     Pulmonary Assessment Scores    Row Name 10/08/16 0943         ADL UCSD   ADL Phase Entry     SOB Score total 20       CAT Score   CAT Score 7        Pulmonary Function Assessment:   Exercise Target Goals:    Exercise Program Goal: Individual exercise prescription set with THRR, safety & activity barriers. Participant demonstrates ability to understand and report RPE using BORG scale, to self-measure pulse accurately, and to acknowledge the importance of the exercise prescription.  Exercise Prescription Goal: Starting with aerobic activity 30 plus minutes a day, 3 days per week for initial exercise prescription. Provide home exercise prescription and guidelines that participant acknowledges understanding  prior to discharge.  Activity Barriers & Risk Stratification:   6 Minute Walk:     6 Minute Walk    Row Name 10/07/16 1616         6 Minute Walk  Phase Initial     Distance 1155 feet     Walk Time 6 minutes     # of Rest Breaks 0     MPH 2.18     METS 2.61     RPE 13     Perceived Dyspnea  1     Symptoms No     Resting HR 68 bpm     Resting BP 120/56     Max Ex. HR 108 bpm     Max Ex. BP 140/90     2 Minute Post BP 138/82       Interval HR   Baseline HR 68     1 Minute HR 91     2 Minute HR 91     3 Minute HR 104     4 Minute HR 104     5 Minute HR 105     6 Minute HR 108     2 Minute Post HR 85     Interval Heart Rate? Yes       Interval Oxygen   Interval Oxygen? Yes     Baseline Oxygen Saturation % 98 %     Baseline Liters of Oxygen 2 L     1 Minute Oxygen Saturation % 92 %     1 Minute Liters of Oxygen 2 L     2 Minute Oxygen Saturation % 93 %     2 Minute Liters of Oxygen 2 L     3 Minute Oxygen Saturation % 88 %     3 Minute Liters of Oxygen 2 L     4 Minute Oxygen Saturation % 88 %     4 Minute Liters of Oxygen 2 L     5 Minute Oxygen Saturation % 89 %     5 Minute Liters of Oxygen 2 L     6 Minute Oxygen Saturation % 89 %     6 Minute Liters of Oxygen 2 L     2 Minute Post Oxygen Saturation % 97 %     2 Minute Post Liters of Oxygen 2 L        Oxygen Initial Assessment:     Oxygen Initial Assessment - 10/07/16 1623      Home Oxygen   Home Oxygen Device Portable Concentrator;Home Concentrator   Sleep Oxygen Prescription Continuous;None   Home Exercise Oxygen Prescription Pulsed   Liters per minute 2   Home at Rest Exercise Oxygen Prescription None   Compliance with Home Oxygen Use Yes     Initial 6 min Walk   Oxygen Used Continuous;E-Tanks   Liters per minute 2   Resting Oxygen Saturation  during 6 min walk 98 %   Exercise Oxygen Saturation  during 6 min walk 88 %     Program Oxygen Prescription   Program Oxygen Prescription  Continuous;E-Tanks   Liters per minute 2      Oxygen Re-Evaluation:     Oxygen Re-Evaluation    Row Name 11/03/16 1630 12/04/16 1311           Program Oxygen Prescription   Program Oxygen Prescription Continuous;E-Tanks Continuous;E-Tanks      Liters per minute 2 2        Home Oxygen   Home Oxygen Device Portable Concentrator;Home Concentrator Portable Concentrator;Home Concentrator      Sleep Oxygen Prescription None None      Home Exercise Oxygen Prescription Pulsed Pulsed      Liters per  minute 2 2      Home at Rest Exercise Oxygen Prescription None None      Compliance with Home Oxygen Use Yes Yes        Goals/Expected Outcomes   Short Term Goals To learn and exhibit compliance with exercise, home and travel O2 prescription To learn and exhibit compliance with exercise, home and travel O2 prescription      Long  Term Goals Exhibits compliance with exercise, home and travel O2 prescription;Maintenance of O2 saturations>88%;Compliance with respiratory medication;Verbalizes importance of monitoring SPO2 with pulse oximeter and return demonstration;Exhibits proper breathing techniques, such as purse lipped breathing or other method taught during program session;Demonstrates proper use of MDI's Exhibits compliance with exercise, home and travel O2 prescription;Maintenance of O2 saturations>88%;Compliance with respiratory medication;Verbalizes importance of monitoring SPO2 with pulse oximeter and return demonstration;Exhibits proper breathing techniques, such as purse lipped breathing or other method taught during program session;Demonstrates proper use of MDI's      Comments patient verbalizes compliance with home O2 perscription. She always wears her oxygen to pulmonary rehab and is observed wearing her oxygen when she leaves. patient verbalizes compliance with home O2 perscription. She always wears her oxygen to pulmonary rehab and is observed wearing her oxygen when she leaves.       Goals/Expected Outcomes see above goals see above goals         Oxygen Discharge (Final Oxygen Re-Evaluation):     Oxygen Re-Evaluation - 12/04/16 1311      Program Oxygen Prescription   Program Oxygen Prescription Continuous;E-Tanks   Liters per minute 2     Home Oxygen   Home Oxygen Device Portable Concentrator;Home Concentrator   Sleep Oxygen Prescription None   Home Exercise Oxygen Prescription Pulsed   Liters per minute 2   Home at Rest Exercise Oxygen Prescription None   Compliance with Home Oxygen Use Yes     Goals/Expected Outcomes   Short Term Goals To learn and exhibit compliance with exercise, home and travel O2 prescription   Long  Term Goals Exhibits compliance with exercise, home and travel O2 prescription;Maintenance of O2 saturations>88%;Compliance with respiratory medication;Verbalizes importance of monitoring SPO2 with pulse oximeter and return demonstration;Exhibits proper breathing techniques, such as purse lipped breathing or other method taught during program session;Demonstrates proper use of MDI's   Comments patient verbalizes compliance with home O2 perscription. She always wears her oxygen to pulmonary rehab and is observed wearing her oxygen when she leaves.   Goals/Expected Outcomes see above goals      Initial Exercise Prescription:     Initial Exercise Prescription - 10/07/16 1600      Date of Initial Exercise RX and Referring Provider   Date 10/07/16   Referring Provider Dr. Tomi Bamberger     Oxygen   Oxygen Continuous   Liters 2     Bike   Level 2   Minutes 17     NuStep   Level 3   Minutes 17   METs 1.5     Track   Laps 10   Minutes 17     Prescription Details   Frequency (times per week) 2   Duration Progress to 45 minutes of aerobic exercise without signs/symptoms of physical distress     Intensity   THRR 40-80% of Max Heartrate 60-120   Ratings of Perceived Exertion 11-13   Perceived Dyspnea 0-4     Progression    Progression Continue progressive overload as per policy without signs/symptoms or physical distress.  Resistance Training   Training Prescription Yes   Weight orange bands   Reps 10-15      Perform Capillary Blood Glucose checks as needed.  Exercise Prescription Changes:     Exercise Prescription Changes    Row Name 10/09/16 1200 10/14/16 1200 10/16/16 1200 10/21/16 1200 10/23/16 1200     Response to Exercise   Blood Pressure (Admit) 140/50 110/64 112/52 134/64 122/54   Blood Pressure (Exercise) 152/70 144/80 140/72 154/84 134/80   Blood Pressure (Exit) 124/70 116/80 120/62 118/70 110/60   Heart Rate (Admit) 74 bpm 76 bpm 72 bpm 82 bpm 74 bpm   Heart Rate (Exercise) 110 bpm 100 bpm 102 bpm 105 bpm 101 bpm   Heart Rate (Exit) 87 bpm 78 bpm 73 bpm 86 bpm 78 bpm   Oxygen Saturation (Admit) 95 % 98 % 98 % 97 % 93 %   Oxygen Saturation (Exercise) 90 % 89 % 88 % 89 % 92 %   Oxygen Saturation (Exit) 96 % 97 % 93 % 94 % 96 %   Rating of Perceived Exertion (Exercise) _0 Perceived Dyspnea (Exercise) _1 Duration Progress to 45 minutes of aerobic exercise without signs/symptoms of physical distress Progress to 45 minutes of aerobic exercise without signs/symptoms of physical distress Progress to 45 minutes of aerobic exercise without signs/symptoms of physical distress Progress to 45 minutes of aerobic exercise without signs/symptoms of physical distress Progress to 45 minutes of aerobic exercise without signs/symptoms of physical distress   Intensity _2      Progression   Progression Continue to progress workloads to maintain intensity without signs/symptoms of physical distress. Continue to progress workloads to maintain intensity without signs/symptoms of physical distress. Continue to progress workloads to maintain intensity without signs/symptoms of physical distress. Continue to progress  workloads to maintain intensity without signs/symptoms of physical distress. Continue to progress workloads to maintain intensity without signs/symptoms of physical distress.     Resistance Training   Training Prescription _3    Weight _4    Reps 10-15 10-15 10-15 10-15 10-15   Time 10 Minutes 10 Minutes 10 Minutes 10 Minutes 10 Minutes     Oxygen   Oxygen _5    Liters _6 Bike   Level _7 -   Minutes _8 -     NuStep   Level  - 4  - 4 4   Minutes  - 17  - 17 17   METs  - 1.9  - 1.6 1.9     Track   Laps _9 Minutes _10 Row Name 10/28/16 1200 11/06/16 1200 11/11/16 1200 11/13/16 1200 11/18/16 1200     Response to Exercise   Blood Pressure (Admit) 122/66 130/72 126/64 120/64 108/54   Blood Pressure (Exercise) 132/70 152/80 170/80 152/70 170/84   Blood Pressure (Exit) 118/60 122/60 122/60 124/70 114/60   Heart Rate (Admit) 76 bpm 73 bpm 78 bpm 74 bpm 65 bpm   Heart Rate (Exercise) 102 bpm 108 bpm 106 bpm 101 bpm 101 bpm   Heart Rate (Exit) 84 bpm 95 bpm 78 bpm 71 bpm 72 bpm   Oxygen Saturation (Admit) 96 %  93 % 95 % 94 % 94 %   Oxygen Saturation (Exercise) 89 % 88 % 86 % 91 % 88 %   Oxygen Saturation (Exit) 94 % 92 % 93 % 94 % 93 %   Rating of Perceived Exertion (Exercise) _0 Perceived Dyspnea (Exercise) _1 Duration Progress to 45 minutes of aerobic exercise without signs/symptoms of physical distress Progress to 45 minutes of aerobic exercise without signs/symptoms of physical distress Progress to 45 minutes of aerobic exercise without signs/symptoms of physical distress Progress to 45 minutes of aerobic exercise without signs/symptoms of physical distress Progress to 45 minutes of aerobic exercise without signs/symptoms of physical distress   Intensity THRR unchanged THRR unchanged  THRR unchanged THRR unchanged THRR unchanged     Progression   Progression Continue to progress workloads to maintain intensity without signs/symptoms of physical distress. Continue to progress workloads to maintain intensity without signs/symptoms of physical distress. Continue to progress workloads to maintain intensity without signs/symptoms of physical distress. Continue to progress workloads to maintain intensity without signs/symptoms of physical distress. Continue to progress workloads to maintain intensity without signs/symptoms of physical distress.     Resistance Training   Training Prescription _2    Weight _3    Reps 10-15 10-15 10-15 10-15 10-15   Time 10 Minutes 10 Minutes 10 Minutes 10 Minutes 10 Minutes     Interval Training   Interval Training  -  -  -  - No     Oxygen   Oxygen _4    Liters _5 Bike   Level 4  - 4 4  -   Minutes 17  - 17 17  -     Recumbant Bike   Level  - 3 3  - 4   Minutes  - 17 17  - 17     NuStep   Level 5  - _6 Minutes 17  - _7 METs 2.1  - 1.9 2.1 2.1     Track   Laps _8 - 10   Minutes _9 - 17   Row Name 11/20/16 1200 11/25/16 1200 11/27/16 1200 12/02/16 1200       Response to Exercise   Blood Pressure (Admit) 102/60 124/76 120/54 110/60    Blood Pressure (Exercise) 120/70 122/70 154/64 130/72    Blood Pressure (Exit) 134/70 104/62 110/72 110/70    Heart Rate (Admit) 66 bpm 69 bpm 77 bpm 82 bpm    Heart Rate (Exercise) 104 bpm 114 bpm 108 bpm 105 bpm    Heart Rate (Exit) 80 bpm 75 bpm 80 bpm 83 bpm    Oxygen Saturation (Admit) 95 % 93 % 95 % 92 %    Oxygen Saturation (Exercise) 91 % 88 % 95 % 91 %    Oxygen Saturation (Exit) 94 % 94 % 95 % 93 %    Rating of Perceived Exertion (Exercise) _10 Perceived Dyspnea (Exercise) _11 Duration Progress to 45  minutes of aerobic exercise without signs/symptoms of physical distress Progress to 45 minutes of aerobic exercise without signs/symptoms of physical distress Progress to 45 minutes of aerobic exercise without signs/symptoms of physical distress Progress to 45  minutes of aerobic exercise without signs/symptoms of physical distress    Intensity THRR unchanged THRR unchanged THRR unchanged THRR unchanged      Progression   Progression Continue to progress workloads to maintain intensity without signs/symptoms of physical distress. Continue to progress workloads to maintain intensity without signs/symptoms of physical distress. Continue to progress workloads to maintain intensity without signs/symptoms of physical distress. Continue to progress workloads to maintain intensity without signs/symptoms of physical distress.      Resistance Training   Training Prescription  - Yes Yes Yes    Weight  - orange bands orange bands orange bands    Reps  - 10-15 10-15 10-15    Time  - 10 Minutes 10 Minutes 10 Minutes      Interval Training   Interval Training  - No No No      Oxygen   Oxygen  - Continuous Continuous Continuous    Liters  - _0 Bike   Level  -  - 4 4    Minutes  -  - 17 17      Recumbant Bike   Level 4 4  -  -    Minutes 17 17  -  -      NuStep   Level  - _1 Minutes  - _2 METs  - 2.1 2.1 2.1      Track   Laps 14 12  - 10    Minutes 17 17  - 17       Exercise Comments:   Exercise Goals and Review:   Exercise Goals Re-Evaluation :     Exercise Goals Re-Evaluation    Row Name 11/03/16 0740 12/01/16 1131           Exercise Goal Re-Evaluation   Exercise Goals Review Increase Physical Activity;Increase Strenth and Stamina Increase Physical Activity;Increase Strenth and Stamina      Comments Patient is slowly progressing in program. Up to 12-14 laps (259fperlap) in 15 minutes. Will cont. to monitor and progress.  Patient is slowly making  progress. Will cont to monitor and motivate to strive for higher intensities.       Expected Outcomes Through exercise at rehab and home patient will increase physical capacity, stength, and stamina. Through exercising at rehab and at home, patient will increase physical strength and stamina and find ADL's easier to perform.          Discharge Exercise Prescription (Final Exercise Prescription Changes):     Exercise Prescription Changes - 12/02/16 1200      Response to Exercise   Blood Pressure (Admit) 110/60   Blood Pressure (Exercise) 130/72   Blood Pressure (Exit) 110/70   Heart Rate (Admit) 82 bpm   Heart Rate (Exercise) 105 bpm   Heart Rate (Exit) 83 bpm   Oxygen Saturation (Admit) 92 %   Oxygen Saturation (Exercise) 91 %   Oxygen Saturation (Exit) 93 %   Rating of Perceived Exertion (Exercise) 12   Perceived Dyspnea (Exercise) 2   Duration Progress to 45 minutes of aerobic exercise without signs/symptoms of physical distress   Intensity THRR unchanged     Progression   Progression Continue to progress workloads to maintain intensity without signs/symptoms of physical distress.     Resistance Training   Training Prescription Yes   Weight orange bands   Reps 10-15   Time 10 Minutes  Interval Training   Interval Training No     Oxygen   Oxygen Continuous   Liters 2     Bike   Level 4   Minutes 17     NuStep   Level 6   Minutes 17   METs 2.1     Track   Laps 10   Minutes 17      Nutrition:  Target Goals: Understanding of nutrition guidelines, daily intake of sodium <1568m, cholesterol <2033m calories 30% from fat and 7% or less from saturated fats, daily to have 5 or more servings of fruits and vegetables.  Biometrics:    Nutrition Therapy Plan and Nutrition Goals:     Nutrition Therapy & Goals - 10/16/16 1040      Nutrition Therapy   Diet Carb Modified, Therapeutic Lifestyle Changes     Personal Nutrition Goals   Nutrition Goal Wt loss  of 1-2 lb/week to a wt loss goal of 6-24 lb at graduation from rehab.      Intervention Plan   Intervention Prescribe, educate and counsel regarding individualized specific dietary modifications aiming towards targeted core components such as weight, hypertension, lipid management, diabetes, heart failure and other comorbidities.   Expected Outcomes Short Term Goal: Understand basic principles of dietary content, such as calories, fat, sodium, cholesterol and nutrients.;Long Term Goal: Adherence to prescribed nutrition plan.      Nutrition Discharge: Rate Your Plate Scores:     Nutrition Assessments - 10/16/16 1031      Rate Your Plate Scores   Pre Score 59      Nutrition Goals Re-Evaluation:   Nutrition Goals Discharge (Final Nutrition Goals Re-Evaluation):   Psychosocial: Target Goals: Acknowledge presence or absence of significant depression and/or stress, maximize coping skills, provide positive support system. Participant is able to verbalize types and ability to use techniques and skills needed for reducing stress and depression.  Initial Review & Psychosocial Screening:   Quality of Life Scores:   PHQ-9: Recent Review Flowsheet Data    Depression screen PHNavos/9 09/29/2016 07/21/2016 02/14/2016 01/10/2015 07/06/2013   Decreased Interest 0 0 0 0 0   Down, Depressed, Hopeless 0 0 0 0 0   PHQ - 2 Score 0 0 0 0 0     Interpretation of Total Score  Total Score Depression Severity:  1-4 = Minimal depression, 5-9 = Mild depression, 10-14 = Moderate depression, 15-19 = Moderately severe depression, 20-27 = Severe depression   Psychosocial Evaluation and Intervention:   Psychosocial Re-Evaluation:     Psychosocial Re-Evaluation    [place] 11/03/16 1638 12/04/16 1312           Psychosocial Re-Evaluation   Comments no psychosocial barriers identified since admission no psychosocial barriers identified since admission      Expected Outcomes patient will remain free from  psychosocial barriers patient will remain free from psychosocial barriers      Interventions Encouraged to attend Pulmonary Rehabilitation for the exercise Encouraged to attend Pulmonary Rehabilitation for the exercise      Continue Psychosocial Services   - No Follow up required         Psychosocial Discharge (Final Psychosocial Re-Evaluation):     Psychosocial Re-Evaluation - 12/04/16 1312      Psychosocial Re-Evaluation   Comments no psychosocial barriers identified since admission   Expected Outcomes patient will remain free from psychosocial barriers   Interventions Encouraged to attend Pulmonary Rehabilitation for the exercise   Continue Psychosocial Services  No  Follow up required      Education: Education Goals: Education classes will be provided on a weekly basis, covering required topics. Participant will state understanding/return demonstration of topics presented.  Learning Barriers/Preferences:   Education Topics: Risk Factor Reduction:  -Group instruction that is supported by a PowerPoint presentation. Instructor discusses the definition of a risk factor, different risk factors for pulmonary disease, and how the heart and lungs work together.     Nutrition for Pulmonary Patient:  -Group instruction provided by PowerPoint slides, verbal discussion, and written materials to support subject matter. The instructor gives an explanation and review of healthy diet recommendations, which includes a discussion on weight management, recommendations for fruit and vegetable consumption, as well as protein, fluid, caffeine, fiber, sodium, sugar, and alcohol. Tips for eating when patients are short of breath are discussed.   Pursed Lip Breathing:  -Group instruction that is supported by demonstration and informational handouts. Instructor discusses the benefits of pursed lip and diaphragmatic breathing and detailed demonstration on how to preform both.     Oxygen Safety:   -Group instruction provided by PowerPoint, verbal discussion, and written material to support subject matter. There is an overview of "What is Oxygen" and "Why do we need it".  Instructor also reviews how to create a safe environment for oxygen use, the importance of using oxygen as prescribed, and the risks of noncompliance. There is a brief discussion on traveling with oxygen and resources the patient may utilize.   Oxygen Equipment:  -Group instruction provided by [facility] Staff utilizing handouts, written materials, and equipment demonstrations.   PULMONARY REHAB OTHER RESPIRATORY from 11/27/2016 in [place]  Date  11/20/16  Educator  [facility]  Instruction Review Code  2- meets goals/outcomes      Signs and Symptoms:  -Group instruction provided by written material and verbal discussion to support subject matter. Warning signs and symptoms of infection, stroke, and heart attack are reviewed and when to call the physician/911 reinforced. Tips for preventing the spread of infection discussed.   PULMONARY REHAB OTHER RESPIRATORY from 11/27/2016 in [place]  Date  08/07/16  Educator  MD  Instruction Review Code  2- meets goals/outcomes      Advanced Directives:  -Group instruction provided by verbal instruction and written material to support subject matter. Instructor reviews Advanced Directive laws and proper instruction for filling out document.   Pulmonary Video:  -Group video education that reviews the importance of medication and oxygen compliance, exercise, good nutrition, pulmonary hygiene, and pursed lip and diaphragmatic breathing for the pulmonary patient.   PULMONARY REHAB OTHER RESPIRATORY from 11/27/2016 in [place]  Date  10/09/16  Instruction Review Code  R- Review/reinforce      Exercise for the Pulmonary Patient:  -Group instruction that is supported by a  PowerPoint presentation. Instructor discusses benefits of exercise, core components of exercise, frequency, duration, and intensity of an exercise routine, importance of utilizing pulse oximetry during exercise, safety while exercising, and options of places to exercise outside of rehab.     PULMONARY REHAB OTHER RESPIRATORY from 11/27/2016 in [place]  Date  10/16/16  Educator  EP  Instruction Review Code  R- Review/reinforce      Pulmonary Medications:  -Verbally interactive group education provided by instructor with focus on inhaled medications and proper administration.   PULMONARY REHAB OTHER RESPIRATORY from 11/27/2016 in [place]  REHAB  Date  11/13/16  Educator  Pharm  Instruction Review Code  R- Review/reinforce      Anatomy and Physiology of the Respiratory System and Intimacy:  -Group instruction provided by PowerPoint, verbal discussion, and written material to support subject matter. Instructor reviews respiratory cycle and anatomical components of the respiratory system and their functions. Instructor also reviews differences in obstructive and restrictive respiratory diseases with examples of each. Intimacy, Sex, and Sexuality differences are reviewed with a discussion on how relationships can change when diagnosed with pulmonary disease. Common sexual concerns are reviewed.   PULMONARY REHAB OTHER RESPIRATORY from 11/27/2016 in [place]  Date  11/06/16  Educator  RN  Instruction Review Code  2- meets goals/outcomes      MD DAY -A group question and answer session with a medical doctor that allows participants to ask questions that relate to their pulmonary disease state.   OTHER EDUCATION -Group or individual verbal, written, or video instructions that support the educational goals of the pulmonary rehab program.   Knowledge Questionnaire Score:     Knowledge Questionnaire  Score - 10/08/16 0943      Knowledge Questionnaire Score   Pre Score 11/13      Core Components/Risk Factors/Patient Goals at Admission:   Core Components/Risk Factors/Patient Goals Review:      Goals and Risk Factor Review    Row Name 11/03/16 1634 12/04/16 1312           Core Components/Risk Factors/Patient Goals Review   Personal Goals Review Weight Management/Obesity;Develop more efficient breathing techniques such as purse lipped breathing and diaphragmatic breathing and practicing self-pacing with activity.;Improve shortness of breath with ADL's Weight Management/Obesity;Develop more efficient breathing techniques such as purse lipped breathing and diaphragmatic breathing and practicing self-pacing with activity.;Improve shortness of breath with ADL's      Review see comment section on ITP see comment section on ITP      Expected Outcomes see Admission expected outcomes see Admission expected outcomes         Core Components/Risk Factors/Patient Goals at Discharge (Final Review):      Goals and Risk Factor Review - 12/04/16 1312      Core Components/Risk Factors/Patient Goals Review   Personal Goals Review Weight Management/Obesity;Develop more efficient breathing techniques such as purse lipped breathing and diaphragmatic breathing and practicing self-pacing with activity.;Improve shortness of breath with ADL's   Review see comment section on ITP   Expected Outcomes see Admission expected outcomes      ITP Comments:   Comments: ITP REVIEW Pt is making slow progress toward pulmonary rehab goals after completing 15 sessions. She is tolerating workload increases. She has had several emotional setback this time in rehab. She has had a breast biopsy which came back benign but now has a "spot" on her kidney. She does have a history of breast cancer and recently lost her sister from cancer. This weighs heavy on her mind. She has also had several absences from minor  illnesses. She denies need for emotional counseling. Recommend continued exercise, life style modification, education, and utilization of breathing techniques to increase stamina and strength and decrease shortness of breath with exertion.

## 2016-12-05 ENCOUNTER — Ambulatory Visit: Payer: Medicare HMO | Admitting: Pulmonary Disease

## 2016-12-09 ENCOUNTER — Encounter (HOSPITAL_COMMUNITY): Payer: Medicare HMO

## 2016-12-09 DIAGNOSIS — D494 Neoplasm of unspecified behavior of bladder: Secondary | ICD-10-CM | POA: Diagnosis not present

## 2016-12-09 DIAGNOSIS — D414 Neoplasm of uncertain behavior of bladder: Secondary | ICD-10-CM | POA: Diagnosis not present

## 2016-12-11 ENCOUNTER — Encounter (HOSPITAL_COMMUNITY)
Admission: RE | Admit: 2016-12-11 | Discharge: 2016-12-11 | Disposition: A | Discharge status: Home / Self Care | Payer: Medicare HMO | Source: Ambulatory Visit | Attending: Family Medicine | Admitting: Family Medicine

## 2016-12-11 VITALS — Wt 239.6 lb

## 2016-12-11 DIAGNOSIS — R0602 Shortness of breath: Secondary | ICD-10-CM | POA: Diagnosis not present

## 2016-12-11 NOTE — Progress Notes (Signed)
Daily Session Note  Patient Details  Name: Shannon Schultz MRN: [id number] Date of Birth: 1946/04/10 Referring Provider:     Pulmonary Rehab Walk Test from 10/07/2016 in [place]  Referring Provider  Dr. Tomi Bamberger      Encounter Date: 12/11/2016  Check In:     Session Check In - 12/11/16 1014      Check-In   Location [facility]   Staff Present Trish Fountain, RN, BSN;Albena Comes, MS, ACSM RCEP, Exercise Physiologist   Supervising physician immediately available to respond to emergencies [facility] immediately available   Physician(s) Dr. Sloan Leiter   Medication changes reported     No   Fall or balance concerns reported    No   Tobacco Cessation No Change   Warm-up and Cool-down Performed as group-led instruction   Resistance Training Performed Yes   VAD Patient? No     Pain Assessment   Currently in Pain? No/denies   Multiple Pain Sites No      Capillary Blood Glucose: No results found for this or any previous visit (from the past 24 hour(s)).     POCT Glucose - 12/11/16 1238      POCT Blood Glucose   Pre-Exercise 194 mg/dL   Post-Exercise 109 mg/dL         Exercise Prescription Changes - 12/11/16 1200      Response to Exercise   Blood Pressure (Admit) 122/60   Blood Pressure (Exercise) 130/72   Blood Pressure (Exit) 110/70   Heart Rate (Admit) 80 bpm   Heart Rate (Exercise) 108 bpm   Heart Rate (Exit) 84 bpm   Oxygen Saturation (Admit) 95 %   Oxygen Saturation (Exercise) 89 %   Oxygen Saturation (Exit) 94 %   Rating of Perceived Exertion (Exercise) 13   Perceived Dyspnea (Exercise) 3   Duration Progress to 45 minutes of aerobic exercise without signs/symptoms of physical distress   Intensity THRR unchanged     Progression   Progression Continue to progress workloads to maintain intensity without signs/symptoms of physical distress.     Resistance Training   Training Prescription Yes   Weight  orange bands   Reps 10-15   Time 10 Minutes     Interval Training   Interval Training No     Oxygen   Oxygen Continuous   Liters 2     NuStep   Level 6   Minutes 17   METs 2.3     Track   Laps 13   Minutes 17      History  Smoking Status  . Former Smoker  . Packs/day: 1.00  . Years: 46.00  . Types: Cigarettes  . Quit date: 05/31/2011  Smokeless Tobacco  . Never Used    Goals Met:  Exercise tolerated well No report of cardiac concerns or symptoms Strength training completed today  Goals Unmet:  Not Applicable  Comments: Service time is from 10:30a to 12:28p    Dr. Rush Farmer is Medical Director for Pulmonary Rehab at [facility].

## 2016-12-16 ENCOUNTER — Encounter (HOSPITAL_COMMUNITY)
Admission: RE | Admit: 2016-12-16 | Discharge: 2016-12-16 | Disposition: A | Discharge status: Home / Self Care | Payer: Medicare HMO | Source: Ambulatory Visit | Attending: Family Medicine | Admitting: Family Medicine

## 2016-12-16 VITALS — Wt 240.3 lb

## 2016-12-16 DIAGNOSIS — R0602 Shortness of breath: Secondary | ICD-10-CM | POA: Diagnosis not present

## 2016-12-16 NOTE — Progress Notes (Signed)
Daily Session Note  Patient Details  Name: Shannon Schultz MRN: [id number] Date of Birth: 12/01/1945 Referring Provider:     Pulmonary Rehab Walk Test from 10/07/2016 in [facility]  Referring Provider  Dr. Knapp      Encounter Date: 12/16/2016  Check In:     Session Check In - 12/16/16 1010      Check-In   Location [facility]   Staff Present Portia Payne, RN, BSN;Joan Behrens, RN, BSN;Molly diVincenzo, MS, ACSM RCEP, Exercise Physiologist   Supervising physician immediately available to respond to emergencies [facility] immediately available   Physician(s) Dr. Abrol   Medication changes reported     No   Fall or balance concerns reported    No   Tobacco Cessation No Change   Warm-up and Cool-down Performed as group-led instruction   Resistance Training Performed Yes   VAD Patient? No     Pain Assessment   Currently in Pain? No/denies   Multiple Pain Sites No      Capillary Blood Glucose: No results found for this or any previous visit (from the past 24 hour(s)).      Exercise Prescription Changes - 12/16/16 1200      Response to Exercise   Blood Pressure (Admit) 128/64   Blood Pressure (Exercise) 148/80   Blood Pressure (Exit) 114/60   Heart Rate (Admit) 82 bpm   Heart Rate (Exercise) 105 bpm   Heart Rate (Exit) 76 bpm   Oxygen Saturation (Admit) 94 %   Oxygen Saturation (Exercise) 87 %   Oxygen Saturation (Exit) 95 %   Rating of Perceived Exertion (Exercise) 15   Perceived Dyspnea (Exercise) 2   Duration Progress to 45 minutes of aerobic exercise without signs/symptoms of physical distress   Intensity THRR unchanged     Progression   Progression Continue to progress workloads to maintain intensity without signs/symptoms of physical distress.     Resistance Training   Training Prescription Yes   Weight orange bands   Reps 10-15   Time 10 Minutes     Interval Training   Interval Training No     Oxygen   Oxygen Continuous   Liters 2     Bike   Level 4.5   Minutes 17     NuStep   Level 6   Minutes 17   METs 2.1     Track   Laps 14   Minutes 17      History  Smoking Status  . Former Smoker  . Packs/day: 1.00  . Years: 46.00  . Types: Cigarettes  . Quit date: 05/31/2011  Smokeless Tobacco  . Never Used    Goals Met:  Exercise tolerated well No report of cardiac concerns or symptoms Strength training completed today  Goals Unmet:  Not Applicable  Comments: Service time is from 10:30a to 12:00p    Dr. Wesam G. Yacoub is Medical Director for Pulmonary Rehab at [facility]. 

## 2016-12-16 NOTE — Pre-Procedure Instructions (Signed)
MALIYA MARICH  12/16/2016      [facility] - [place], [place] [address] Phone: [phone] Fax: [phone]  [id number] [place], [place]. [place] [address] Phone: [phone] Fax: [phone]  [facility] [place], [place] [address] [place] [address] Phone: [phone] Fax: [phone]  [id number] [place], [place] [address] [facility] [place] [address] [place] [address] Phone: [phone] Fax: [facility] - [place], [place] [address] Phone: [phone] Fax: [phone]    Your procedure is scheduled on June 25  Report to [place] at [place].M.  Call this number if you have problems the morning of surgery:  [phone]   Remember:  Do not eat food or drink liquids after midnight.   Take these medicines the morning of surgery with A SIP OF WATER acetaminophen (TYLENOL) if needed, albuterol (PROVENTIL HFA;VENTOLIN HFA), albuterol (PROVENTIL) (2.5 MG/3ML) 0.083%, bisoprolol-hydrochlorothiazide (ZIAC), cetirizine (KLS ALLER-TEC), fluticasone (FLONASE), Fluticasone-Umeclidin-Vilant (TRELEGY ELLIPTA), valACYclovir (VALTREX)  BRING ALL INHALERS WITH YOU THE DAY OF SURGERY  7 days prior to surgery STOP taking any Aspirin, Aleve, Naproxen, Ibuprofen, Motrin, Advil, Goody's, BC's, all herbal medications, fish oil, and all vitamins  Follow your doctors instructions regarding your Aspirin.  If no instructions were given by the doctor you will need to call the office to get instructions.  Your pre admission RN will also call for those instructions   WHAT DO I DO ABOUT MY DIABETES MEDICATION?   Marland Kitchen Do not take oral diabetes medicines (pills) the morning of  surgery. metFORMIN (GLUCOPHAGE-XR)   How to Manage Your Diabetes Before and After Surgery  Why is it important to control my blood sugar before and after surgery? . Improving blood sugar levels before and after surgery helps healing and can limit problems. . A way of improving blood sugar control is eating a healthy diet by: o  Eating less sugar and carbohydrates o  Increasing activity/exercise o  Talking with your doctor about reaching your blood sugar goals . High blood sugars (greater than 180 mg/dL) can raise your risk of infections and slow your recovery, so you will need to focus on controlling your diabetes during the weeks before surgery. . Make sure that the doctor who takes care of your diabetes knows about your planned surgery including the date and location.  How do I manage my blood sugar before surgery? . Check your blood sugar at least 4 times a day, starting 2 days before surgery, to make sure that the level is not too high or low. o Check your blood sugar the morning of your surgery when you wake up and every 2 hours until you get to the Short Stay unit. . If your blood sugar is less than 70 mg/dL, you will need to treat for low blood sugar: o Do not take insulin. o Treat a low blood sugar (less than 70 mg/dL) with  cup of clear juice (cranberry or apple), 4 glucose tablets, OR glucose gel. o Recheck blood sugar in 15 minutes after treatment (to make sure it is greater than 70 mg/dL). If your blood sugar is not greater than 70 mg/dL on recheck, call [phone] for further  instructions. . Report your blood sugar to the short stay nurse when you get to Short Stay.  . If you are admitted to the hospital after surgery: o Your blood sugar will be checked by the staff and you will probably be given insulin after surgery (instead of oral diabetes medicines) to make sure you have good blood sugar levels. o The goal for blood sugar control after surgery is 80-180 mg/dL.    Do  not wear jewelry, make-up or nail polish.  Do not wear lotions, powders, or perfumes, or deoderant.  Do not shave 48 hours prior to surgery.  Men may shave face and neck.  Do not bring valuables to the hospital.  [facility] is not responsible for any belongings or valuables.  Contacts, dentures or bridgework may not be worn into surgery.  Leave your suitcase in the car.  After surgery it may be brought to your room.  For patients admitted to the hospital, discharge time will be determined by your treatment team.  Patients discharged the day of surgery will not be allowed to drive home.    Special instructions:   [facility]- Preparing For Surgery  Before surgery, you can play an important role. Because skin is not sterile, your skin needs to be as free of germs as possible. You can reduce the number of germs on your skin by washing with CHG (chlorahexidine gluconate) Soap before surgery.  CHG is an antiseptic cleaner which kills germs and bonds with the skin to continue killing germs even after washing.  Please do not use if you have an allergy to CHG or antibacterial soaps. If your skin becomes reddened/irritated stop using the CHG.  Do not shave (including legs and underarms) for at least 48 hours prior to first CHG shower. It is OK to shave your face.  Please follow these instructions carefully.   1. Shower the NIGHT BEFORE SURGERY and the MORNING OF SURGERY with CHG.   2. If you chose to wash your hair, wash your hair first as usual with your normal shampoo.  3. After you shampoo, rinse your hair and body thoroughly to remove the shampoo.  4. Use CHG as you would any other liquid soap. You can apply CHG directly to the skin and wash gently with a scrungie or a clean washcloth.   5. Apply the CHG Soap to your body ONLY FROM THE NECK DOWN.  Do not use on open wounds or open sores. Avoid contact with your eyes, ears, mouth and genitals (private parts). Wash genitals (private parts)  with your normal soap.  6. Wash thoroughly, paying special attention to the area where your surgery will be performed.  7. Thoroughly rinse your body with warm water from the neck down.  8. DO NOT shower/wash with your normal soap after using and rinsing off the CHG Soap.  9. Pat yourself dry with a CLEAN TOWEL.   10. Wear CLEAN PAJAMAS   11. Place CLEAN SHEETS on your bed the night of your first shower and DO NOT SLEEP WITH PETS.    Day of Surgery: Do not apply any deodorants/lotions. Please wear clean clothes to the hospital/surgery center.      Please read over the following fact sheets that you were given.

## 2016-12-17 ENCOUNTER — Encounter (HOSPITAL_COMMUNITY): Payer: Self-pay

## 2016-12-17 ENCOUNTER — Other Ambulatory Visit: Payer: Self-pay | Admitting: Urology

## 2016-12-17 ENCOUNTER — Other Ambulatory Visit (HOSPITAL_COMMUNITY): Payer: Self-pay | Admitting: *Deleted

## 2016-12-17 ENCOUNTER — Encounter (HOSPITAL_COMMUNITY)
Admission: RE | Admit: 2016-12-17 | Discharge: 2016-12-17 | Disposition: A | Discharge status: Home / Self Care | Payer: Medicare HMO | Source: Ambulatory Visit | Attending: General Surgery | Admitting: General Surgery

## 2016-12-17 DIAGNOSIS — Z01812 Encounter for preprocedural laboratory examination: Secondary | ICD-10-CM | POA: Insufficient documentation

## 2016-12-17 DIAGNOSIS — Z01810 Encounter for preprocedural cardiovascular examination: Secondary | ICD-10-CM | POA: Insufficient documentation

## 2016-12-17 HISTORY — DX: Adverse effect of unspecified anesthetic, initial encounter: T41.45XA

## 2016-12-17 HISTORY — DX: Unspecified osteoarthritis, unspecified site: M19.90

## 2016-12-17 HISTORY — DX: Dyspnea, unspecified: R06.00

## 2016-12-17 HISTORY — DX: Hypothyroidism, unspecified: E03.9

## 2016-12-17 HISTORY — DX: Nonrheumatic aortic (valve) stenosis: I35.0

## 2016-12-17 LAB — BASIC METABOLIC PANEL
Anion gap: 7 (ref 5–15)
BUN: 16 mg/dL (ref 6–20)
CO2: 29 mmol/L (ref 22–32)
Calcium: 9.3 mg/dL (ref 8.9–10.3)
Chloride: 104 mmol/L (ref 101–111)
Creatinine, Ser: 0.7 mg/dL (ref 0.44–1.00)
GFR calc Af Amer: >60 mL/min (ref >60)
GFR calc non Af Amer: >60 mL/min (ref >60)
Glucose, Bld: 203 mg/dL — ABNORMAL HIGH (ref 65–99)
Potassium: 4.1 mmol/L (ref 3.5–5.1)
Sodium: 140 mmol/L (ref 135–145)

## 2016-12-17 LAB — CBC
HCT: 39.2 % (ref 36.0–46.0)
Hemoglobin: 12.3 g/dL (ref 12.0–15.0)
MCH: 29.2 pg (ref 26.0–34.0)
MCHC: 31.4 g/dL (ref 30.0–36.0)
MCV: 93.1 fL (ref 78.0–100.0)
Platelets: 123 10*3/uL — ABNORMAL LOW (ref 150–400)
RBC: 4.21 MIL/uL (ref 3.87–5.11)
RDW: 15.3 % (ref 11.5–15.5)
WBC: 5.3 10*3/uL (ref 4.0–10.5)

## 2016-12-17 LAB — GLUCOSE, CAPILLARY: Glucose-Capillary: 218 mg/dL — ABNORMAL HIGH (ref 65–99)

## 2016-12-17 NOTE — Pre-Procedure Instructions (Addendum)
BERTICE RISSE  12/17/2016      [facility] - [place], [place] [address] Phone: [phone] Fax: [phone]  [id number] [place], [name] [address] Phone: [phone] Fax: [phone]  [facility] [place], [place] [address] [place] [address] Phone: [phone] Fax: [phone]  [id number] [place], [place] [address] [place] [address] [place] [address] Phone: [phone] Fax: [phone]  [facility] - [place], [place] [address] Phone: [phone] Fax: [phone]    Your procedure is scheduled on June 25  Report to [place] at [place].M.  Call this number if you have problems the morning of surgery:  [phone]   Remember:  Do not eat food or drink liquids after midnight.  Drink 8oz water at 430 am day of surgery   Take these medicines the morning of surgery with A SIP OF WATER acetaminophen (TYLENOL) if needed, albuterol (PROVENTIL HFA;VENTOLIN HFA), albuterol (PROVENTIL) (2.5 MG/3ML) 0.083%,nebulizer bisoprolol-hydrochlorothiazide (ZIAC), cetirizine (KLS ALLER-TEC), fluticasone (FLONASE), Fluticasone-Umeclidin-Vilant (TRELEGY ELLIPTA), valACYclovir (VALTREX)  BRING ALL INHALERS WITH YOU THE DAY OF SURGERY  7 days prior to surgery today 6/20/18STOP taking any Aspirin, Aleve, Naproxen, Ibuprofen, Motrin, Advil, Goody's, BC's, all herbal medications, fish oil, and all vitamins    WHAT DO I DO ABOUT MY DIABETES MEDICATION?   Marland Kitchen Do not take oral diabetes medicines (pills) the morning of surgery. metFORMIN (GLUCOPHAGE-XR)   How to Manage Your Diabetes Before and After Surgery  Why is it important to control my blood sugar before and  after surgery? . Improving blood sugar levels before and after surgery helps healing and can limit problems. . A way of improving blood sugar control is eating a healthy diet by: o  Eating less sugar and carbohydrates o  Increasing activity/exercise o  Talking with your doctor about reaching your blood sugar goals . High blood sugars (greater than 180 mg/dL) can raise your risk of infections and slow your recovery, so you will need to focus on controlling your diabetes during the weeks before surgery. . Make sure that the doctor who takes care of your diabetes knows about your planned surgery including the date and location.  How do I manage my blood sugar before surgery? . Check your blood sugar at least 4 times a day, starting 2 days before surgery, to make sure that the level is not too high or low. o Check your blood sugar the morning of your surgery when you wake up and every 2 hours until you get to the Short Stay unit. . If your blood sugar is less than 70 mg/dL, you will need to treat for low blood sugar: o Do not take insulin. o Treat a low blood sugar (less than 70 mg/dL) with  cup of clear juice (cranberry or apple), 4 glucose tablets, OR glucose gel. o Recheck blood sugar in 15 minutes after treatment (to make sure it is greater than 70 mg/dL). If your blood sugar is not greater than 70 mg/dL on recheck, call [phone] for further instructions. . Report your blood sugar to the short stay nurse when you get to Short Stay.  . If you are admitted to the hospital  after surgery: o Your blood sugar will be checked by the staff and you will probably be given insulin after surgery (instead of oral diabetes medicines) to make sure you have good blood sugar levels. o The goal for blood sugar control after surgery is 80-180 mg/dL.    Do not wear jewelry, make-up or nail polish.  Do not wear lotions, powders, or perfumes, or deoderant.  Do not shave 48 hours prior to surgery.  Men may  shave face and neck.  Do not bring valuables to the hospital.  [facility] is not responsible for any belongings or valuables.  Contacts, dentures or bridgework may not be worn into surgery.  Leave your suitcase in the car.  After surgery it may be brought to your room.  For patients admitted to the hospital, discharge time will be determined by your treatment team.  Patients discharged the day of surgery will not be allowed to drive home.    Special instructions:   [facility]- Preparing For Surgery  Before surgery, you can play an important role. Because skin is not sterile, your skin needs to be as free of germs as possible. You can reduce the number of germs on your skin by washing with CHG (chlorahexidine gluconate) Soap before surgery.  CHG is an antiseptic cleaner which kills germs and bonds with the skin to continue killing germs even after washing.  Please do not use if you have an allergy to CHG or antibacterial soaps. If your skin becomes reddened/irritated stop using the CHG.  Do not shave (including legs and underarms) for at least 48 hours prior to first CHG shower. It is OK to shave your face.  Please follow these instructions carefully.   1. Shower the NIGHT BEFORE SURGERY and the MORNING OF SURGERY with CHG.   2. If you chose to wash your hair, wash your hair first as usual with your normal shampoo.  3. After you shampoo, rinse your hair and body thoroughly to remove the shampoo.  4. Use CHG as you would any other liquid soap. You can apply CHG directly to the skin and wash gently with a scrungie or a clean washcloth.   5. Apply the CHG Soap to your body ONLY FROM THE NECK DOWN.  Do not use on open wounds or open sores. Avoid contact with your eyes, ears, mouth and genitals (private parts). Wash genitals (private parts) with your normal soap.  6. Wash thoroughly, paying special attention to the area where your surgery will be performed.  7. Thoroughly rinse your body  with warm water from the neck down.  8. DO NOT shower/wash with your normal soap after using and rinsing off the CHG Soap.  9. Pat yourself dry with a CLEAN TOWEL.   10. Wear CLEAN PAJAMAS   11. Place CLEAN SHEETS on your bed the night of your first shower and DO NOT SLEEP WITH PETS.    Day of Surgery: Do not apply any deodorants/lotions. Please wear clean clothes to the hospital/surgery center.      Please read over the following fact sheets that you were given.

## 2016-12-18 ENCOUNTER — Encounter (HOSPITAL_COMMUNITY)
Admission: RE | Admit: 2016-12-18 | Discharge: 2016-12-18 | Disposition: A | Discharge status: Home / Self Care | Payer: Medicare HMO | Source: Ambulatory Visit | Attending: Family Medicine | Admitting: Family Medicine

## 2016-12-18 ENCOUNTER — Encounter (HOSPITAL_COMMUNITY): Payer: Self-pay

## 2016-12-18 VITALS — Wt 240.7 lb

## 2016-12-18 DIAGNOSIS — R0602 Shortness of breath: Secondary | ICD-10-CM

## 2016-12-18 LAB — HEMOGLOBIN A1C
Hgb A1c MFr Bld: 6.1 % — ABNORMAL HIGH (ref 4.8–5.6)
Mean Plasma Glucose: 128 mg/dL

## 2016-12-18 NOTE — Progress Notes (Signed)
Daily Session Note  Patient Details  Name: Shannon Schultz MRN: [id number] Date of Birth: 06-07-1946 Referring Provider:     Pulmonary Rehab Walk Test from 10/07/2016 in [place]  Referring Provider  Dr. Tomi Bamberger      Encounter Date: 12/18/2016  Check In:     Session Check In - 12/18/16 1030      Check-In   Location [facility]   Staff Present Trish Fountain, RN, Maxcine Ham, RN, BSN;Gerry Heaphy, MS, ACSM RCEP, Exercise Physiologist   Supervising physician immediately available to respond to emergencies [facility] immediately available   Physician(s) Dr. Allyson Sabal   Medication changes reported     No   Fall or balance concerns reported    No   Tobacco Cessation No Change   Warm-up and Cool-down Performed as group-led instruction   Resistance Training Performed Yes   VAD Patient? No     Pain Assessment   Currently in Pain? No/denies   Multiple Pain Sites No      Capillary Blood Glucose: No results found for this or any previous visit (from the past 24 hour(s)).     POCT Glucose - 12/18/16 1234      POCT Blood Glucose   Pre-Exercise 148 mg/dL   Post-Exercise 117 mg/dL         Exercise Prescription Changes - 12/18/16 1200      Response to Exercise   Blood Pressure (Admit) 124/72   Blood Pressure (Exercise) 160/76   Blood Pressure (Exit) 122/68   Heart Rate (Admit) 77 bpm   Heart Rate (Exercise) 111 bpm   Heart Rate (Exit) 82 bpm   Oxygen Saturation (Admit) 94 %   Oxygen Saturation (Exercise) 88 %   Oxygen Saturation (Exit) 97 %   Rating of Perceived Exertion (Exercise) 15   Perceived Dyspnea (Exercise) 2   Duration Progress to 45 minutes of aerobic exercise without signs/symptoms of physical distress   Intensity THRR unchanged     Progression   Progression Continue to progress workloads to maintain intensity without signs/symptoms of physical distress.     Resistance Training   Training  Prescription Yes   Weight orange bands   Reps 10-15   Time 10 Minutes     Interval Training   Interval Training No     Oxygen   Oxygen Continuous   Liters 2     Bike   Level 4.5   Minutes 17     NuStep   Level 6   Minutes 17   METs 2.6      History  Smoking Status  . Former Smoker  . Packs/day: 1.00  . Years: 46.00  . Types: Cigarettes  . Quit date: 05/31/2011  Smokeless Tobacco  . Never Used    Goals Met:  Exercise tolerated well No report of cardiac concerns or symptoms Strength training completed today  Goals Unmet:  Not Applicable  Comments: Service time is from 10:30a to 12:20p    Dr. Rush Farmer is Medical Director for Pulmonary Rehab at [facility].

## 2016-12-18 NOTE — Progress Notes (Signed)
I have reviewed a Home Exercise Prescription with [name] . Coley is not currently exercising at home.  The patient was advised to walk 2-3 days a week for 30 minutes.  Kayle and I discussed how to progress their exercise prescription.  The patient stated that their goals were to stop disease progression and lose weight.  The patient stated that they understand the exercise prescription.  We reviewed exercise guidelines, target heart rate during exercise, oxygen use, weather, home pulse oximeter, endpoints for exercise, and goals.  Patient is encouraged to come to me with any questions. I will continue to follow up with the patient to assist them with progression and safety.

## 2016-12-18 NOTE — Progress Notes (Addendum)
Anesthesia Chart Review:  Pt is a 71 year old female scheduled for L breast lumpectomy with radioactive seed localization on 12/22/2016 with Jovita Kussmaul, MD.   - PCP is [name], MD - Cardiologist is Candee Furbish, MD. Last office visit 03/25/16; f/u in 1 year recommended.  - Pulmonologist is Marshell Garfinkel, MD. Last office visit 09/24/16  PMH includes:  Mild to moderate aortic stenosis (by 04/08/16 echo), HTN, DM, hyperlipidemia, COPD (participates in pulmonary rehab), fatty liver, hypothyroidism, bladder mass (pt to see urology), breast cancer. Former smoker. BMI 41.5  Medications include: albuterol, ASA 81mg , bisoprolol-hctz, trelegy ellipta, lasix, metformin, simvastatin  Preoperative labs reviewed.  HbA1c 6.1, glucose 203  EKG 12/17/16: NSR. Low voltage QRS  Echo 04/08/16:  - Left ventricle: The cavity size was normal. Wall thickness was increased in a pattern of mild LVH. Doppler parameters are consistent with abnormal left ventricular relaxation (grade 1 diastolic dysfunction). - Aortic valve: Mildly to moderately calcified annulus. Mildly thickened leaflets. There was mild to moderate stenosis. Valve area (VTI): 0.99 cm^2. Valve area (Vmax): 1.02 cm^2. Valve area (Vmean): 0.94 cm^2.  Nuclear stress test 03/22/15:    Nuclear stress EF: 71%.   There was no ST segment deviation noted during stress.   The study is normal. No evidence of ischemia. No evidence of infarction .   This is a low risk study.  If no changes, I anticipate pt can proceed with surgery as scheduled.   Willeen Cass, FNP-BC [facility]/Anesthesiology Phone: [phone] 12/18/2016 10:44 AM

## 2016-12-19 ENCOUNTER — Ambulatory Visit
Admission: RE | Admit: 2016-12-19 | Discharge: 2016-12-19 | Disposition: A | Discharge status: Home / Self Care | Payer: Medicare HMO | Source: Ambulatory Visit | Attending: General Surgery | Admitting: General Surgery

## 2016-12-19 DIAGNOSIS — R928 Other abnormal and inconclusive findings on diagnostic imaging of breast: Secondary | ICD-10-CM | POA: Diagnosis not present

## 2016-12-19 DIAGNOSIS — N6092 Unspecified benign mammary dysplasia of left breast: Secondary | ICD-10-CM

## 2016-12-22 ENCOUNTER — Encounter (HOSPITAL_COMMUNITY)
Admission: RE | Disposition: A | Discharge status: Home / Self Care | Payer: Self-pay | Source: Ambulatory Visit | Attending: General Surgery

## 2016-12-22 ENCOUNTER — Encounter (HOSPITAL_COMMUNITY): Payer: Self-pay | Admitting: *Deleted

## 2016-12-22 ENCOUNTER — Ambulatory Visit
Admission: RE | Admit: 2016-12-22 | Discharge: 2016-12-22 | Disposition: A | Discharge status: Home / Self Care | Payer: Medicare HMO | Source: Ambulatory Visit | Attending: General Surgery | Admitting: General Surgery

## 2016-12-22 ENCOUNTER — Ambulatory Visit (HOSPITAL_COMMUNITY)
Admission: RE | Admit: 2016-12-22 | Discharge: 2016-12-22 | Disposition: A | Discharge status: Home / Self Care | Payer: Medicare HMO | Source: Ambulatory Visit | Attending: General Surgery | Admitting: General Surgery

## 2016-12-22 ENCOUNTER — Ambulatory Visit (HOSPITAL_COMMUNITY): Payer: Medicare HMO | Admitting: Emergency Medicine

## 2016-12-22 ENCOUNTER — Ambulatory Visit (HOSPITAL_COMMUNITY): Payer: Medicare HMO | Admitting: Certified Registered"

## 2016-12-22 DIAGNOSIS — E119 Type 2 diabetes mellitus without complications: Secondary | ICD-10-CM | POA: Insufficient documentation

## 2016-12-22 DIAGNOSIS — D0502 Lobular carcinoma in situ of left breast: Secondary | ICD-10-CM | POA: Diagnosis not present

## 2016-12-22 DIAGNOSIS — J449 Chronic obstructive pulmonary disease, unspecified: Secondary | ICD-10-CM | POA: Insufficient documentation

## 2016-12-22 DIAGNOSIS — N6092 Unspecified benign mammary dysplasia of left breast: Secondary | ICD-10-CM

## 2016-12-22 DIAGNOSIS — Z79899 Other long term (current) drug therapy: Secondary | ICD-10-CM | POA: Diagnosis not present

## 2016-12-22 DIAGNOSIS — N6012 Diffuse cystic mastopathy of left breast: Secondary | ICD-10-CM | POA: Diagnosis not present

## 2016-12-22 DIAGNOSIS — Z7982 Long term (current) use of aspirin: Secondary | ICD-10-CM | POA: Insufficient documentation

## 2016-12-22 DIAGNOSIS — Z6841 Body Mass Index (BMI) 40.0 and over, adult: Secondary | ICD-10-CM | POA: Insufficient documentation

## 2016-12-22 DIAGNOSIS — M1990 Unspecified osteoarthritis, unspecified site: Secondary | ICD-10-CM | POA: Diagnosis not present

## 2016-12-22 DIAGNOSIS — E079 Disorder of thyroid, unspecified: Secondary | ICD-10-CM | POA: Diagnosis not present

## 2016-12-22 DIAGNOSIS — R928 Other abnormal and inconclusive findings on diagnostic imaging of breast: Secondary | ICD-10-CM | POA: Diagnosis not present

## 2016-12-22 DIAGNOSIS — G43909 Migraine, unspecified, not intractable, without status migrainosus: Secondary | ICD-10-CM | POA: Insufficient documentation

## 2016-12-22 DIAGNOSIS — E7800 Pure hypercholesterolemia, unspecified: Secondary | ICD-10-CM | POA: Insufficient documentation

## 2016-12-22 DIAGNOSIS — I10 Essential (primary) hypertension: Secondary | ICD-10-CM | POA: Insufficient documentation

## 2016-12-22 DIAGNOSIS — D4862 Neoplasm of uncertain behavior of left breast: Secondary | ICD-10-CM | POA: Diagnosis not present

## 2016-12-22 DIAGNOSIS — R921 Mammographic calcification found on diagnostic imaging of breast: Secondary | ICD-10-CM | POA: Diagnosis not present

## 2016-12-22 DIAGNOSIS — Z7984 Long term (current) use of oral hypoglycemic drugs: Secondary | ICD-10-CM | POA: Diagnosis not present

## 2016-12-22 DIAGNOSIS — Z9981 Dependence on supplemental oxygen: Secondary | ICD-10-CM | POA: Insufficient documentation

## 2016-12-22 DIAGNOSIS — Z87891 Personal history of nicotine dependence: Secondary | ICD-10-CM | POA: Diagnosis not present

## 2016-12-22 DIAGNOSIS — N6082 Other benign mammary dysplasias of left breast: Secondary | ICD-10-CM | POA: Diagnosis not present

## 2016-12-22 HISTORY — PX: BREAST LUMPECTOMY WITH RADIOACTIVE SEED LOCALIZATION: SHX6424

## 2016-12-22 LAB — GLUCOSE, CAPILLARY
Glucose-Capillary: 126 mg/dL — ABNORMAL HIGH (ref 65–99)
Glucose-Capillary: 128 mg/dL — ABNORMAL HIGH (ref 65–99)

## 2016-12-22 SURGERY — BREAST LUMPECTOMY WITH RADIOACTIVE SEED LOCALIZATION
Anesthesia: General | Site: Breast | Laterality: Left

## 2016-12-22 MED ORDER — MIDAZOLAM HCL 5 MG/5ML IJ SOLN
INTRAMUSCULAR | Status: DC | PRN
  Administered 2016-12-22: 2 mg via INTRAVENOUS

## 2016-12-22 MED ORDER — FENTANYL CITRATE (PF) 250 MCG/5ML IJ SOLN
INTRAMUSCULAR | Status: DC | PRN
  Administered 2016-12-22: 25 ug via INTRAVENOUS

## 2016-12-22 MED ORDER — GABAPENTIN 300 MG PO CAPS
300.0000 mg | ORAL_CAPSULE | ORAL | Status: AC
  Administered 2016-12-22: 300 mg via ORAL
  Filled 2016-12-22: qty 1

## 2016-12-22 MED ORDER — CHLORHEXIDINE GLUCONATE CLOTH 2 % EX PADS: 6.0000 | MEDICATED_PAD | Freq: Once | CUTANEOUS | Status: DC

## 2016-12-22 MED ORDER — FENTANYL CITRATE (PF) 250 MCG/5ML IJ SOLN
INTRAMUSCULAR | Status: AC
  Filled 2016-12-22: qty 5

## 2016-12-22 MED ORDER — CEFAZOLIN SODIUM-DEXTROSE 2-4 GM/100ML-% IV SOLN
2.0000 g | INTRAVENOUS | Status: AC
  Administered 2016-12-22: 2 g via INTRAVENOUS
  Filled 2016-12-22: qty 100

## 2016-12-22 MED ORDER — ACETAMINOPHEN 500 MG PO TABS
1000.0000 mg | ORAL_TABLET | ORAL | Status: AC
  Administered 2016-12-22: 1000 mg via ORAL
  Filled 2016-12-22: qty 2

## 2016-12-22 MED ORDER — PROPOFOL 10 MG/ML IV BOLUS
INTRAVENOUS | Status: AC
  Filled 2016-12-22: qty 20

## 2016-12-22 MED ORDER — BUPIVACAINE-EPINEPHRINE 0.25% -1:200000 IJ SOLN
INTRAMUSCULAR | Status: DC | PRN
  Administered 2016-12-22: 20 mL

## 2016-12-22 MED ORDER — HYDROCODONE-ACETAMINOPHEN 5-325 MG PO TABS: 1.0000 | ORAL_TABLET | ORAL | 0 refills | Status: DC | PRN

## 2016-12-22 MED ORDER — BUPIVACAINE-EPINEPHRINE (PF) 0.25% -1:200000 IJ SOLN
INTRAMUSCULAR | Status: AC
  Filled 2016-12-22: qty 30

## 2016-12-22 MED ORDER — LACTATED RINGERS IV SOLN
INTRAVENOUS | Status: DC | PRN
  Administered 2016-12-22: 09:00:00 via INTRAVENOUS

## 2016-12-22 MED ORDER — MIDAZOLAM HCL 2 MG/2ML IJ SOLN
INTRAMUSCULAR | Status: AC
  Filled 2016-12-22: qty 2

## 2016-12-22 MED ORDER — TRAMADOL HCL 50 MG PO TABS: 50.0000 mg | ORAL_TABLET | Freq: Four times a day (QID) | ORAL | 0 refills | Status: DC | PRN

## 2016-12-22 MED ORDER — PROPOFOL 10 MG/ML IV BOLUS
INTRAVENOUS | Status: DC | PRN
  Administered 2016-12-22: 120 mg via INTRAVENOUS

## 2016-12-22 MED ORDER — PHENYLEPHRINE 40 MCG/ML (10ML) SYRINGE FOR IV PUSH (FOR BLOOD PRESSURE SUPPORT)
PREFILLED_SYRINGE | INTRAVENOUS | Status: DC | PRN
Start: 2016-12-22 — End: 2016-12-22
  Administered 2016-12-22 (×2): 80 ug via INTRAVENOUS

## 2016-12-22 MED ORDER — 0.9 % SODIUM CHLORIDE (POUR BTL) OPTIME
TOPICAL | Status: DC | PRN
  Administered 2016-12-22: 1000 mL

## 2016-12-22 MED ORDER — LACTATED RINGERS IV SOLN
Freq: Once | INTRAVENOUS | Status: AC
  Administered 2016-12-22: 08:00:00 via INTRAVENOUS

## 2016-12-22 SURGICAL SUPPLY — 39 items
ADH SKN CLS APL DERMABOND .7 (GAUZE/BANDAGES/DRESSINGS) ×1
APPLIER CLIP 9.375 MED OPEN (MISCELLANEOUS)
APR CLP MED 9.3 20 MLT OPN (MISCELLANEOUS)
BLADE SURG 15 STRL LF DISP TIS (BLADE) ×1 IMPLANT
BLADE SURG 15 STRL SS (BLADE) ×2
CANISTER SUCT 3000ML PPV (MISCELLANEOUS) ×2 IMPLANT
CHLORAPREP W/TINT 26ML (MISCELLANEOUS) ×2 IMPLANT
CLIP APPLIE 9.375 MED OPEN (MISCELLANEOUS) IMPLANT
COVER PROBE W GEL 5X96 (DRAPES) ×2 IMPLANT
COVER SURGICAL LIGHT HANDLE (MISCELLANEOUS) ×2 IMPLANT
DERMABOND ADVANCED (GAUZE/BANDAGES/DRESSINGS) ×1
DERMABOND ADVANCED .7 DNX12 (GAUZE/BANDAGES/DRESSINGS) ×1 IMPLANT
DEVICE DUBIN SPECIMEN MAMMOGRA (MISCELLANEOUS) ×2 IMPLANT
DRAPE CHEST BREAST 15X10 FENES (DRAPES) ×2 IMPLANT
DRAPE UTILITY XL STRL (DRAPES) ×2 IMPLANT
ELECT COATED BLADE 2.86 ST (ELECTRODE) ×2 IMPLANT
ELECT REM PT RETURN 9FT ADLT (ELECTROSURGICAL) ×2
ELECTRODE REM PT RTRN 9FT ADLT (ELECTROSURGICAL) ×1 IMPLANT
GLOVE BIO SURGEON STRL SZ7.5 (GLOVE) ×4 IMPLANT
GOWN STRL REUS W/ TWL LRG LVL3 (GOWN DISPOSABLE) ×2 IMPLANT
GOWN STRL REUS W/TWL LRG LVL3 (GOWN DISPOSABLE) ×4
KIT BASIN OR (CUSTOM PROCEDURE TRAY) ×2 IMPLANT
KIT MARKER MARGIN INK (KITS) ×2 IMPLANT
LIGHT WAVEGUIDE WIDE FLAT (MISCELLANEOUS) IMPLANT
NDL HYPO 25X1 1.5 SAFETY (NEEDLE) ×1 IMPLANT
NEEDLE HYPO 25X1 1.5 SAFETY (NEEDLE) ×2 IMPLANT
NS IRRIG 1000ML POUR BTL (IV SOLUTION) ×2 IMPLANT
PACK SURGICAL SETUP 50X90 (CUSTOM PROCEDURE TRAY) ×2 IMPLANT
PENCIL BUTTON HOLSTER BLD 10FT (ELECTRODE) ×2 IMPLANT
SPONGE LAP 18X18 X RAY DECT (DISPOSABLE) ×2 IMPLANT
SUT MNCRL AB 4-0 PS2 18 (SUTURE) ×2 IMPLANT
SUT SILK 2 0 SH (SUTURE) ×1 IMPLANT
SUT VIC AB 3-0 SH 18 (SUTURE) ×2 IMPLANT
SYR BULB 3OZ (MISCELLANEOUS) ×2 IMPLANT
SYR CONTROL 10ML LL (SYRINGE) ×2 IMPLANT
TOWEL OR 17X24 6PK STRL BLUE (TOWEL DISPOSABLE) ×2 IMPLANT
TOWEL OR 17X26 10 PK STRL BLUE (TOWEL DISPOSABLE) ×2 IMPLANT
TUBE CONNECTING 12X1/4 (SUCTIONS) ×2 IMPLANT
YANKAUER SUCT BULB TIP NO VENT (SUCTIONS) ×2 IMPLANT

## 2016-12-22 NOTE — H&P (Signed)
Shannon Schultz  Location: [facility] Patient #: [id number] DOB: 01-31-1946 Married / Language: English / Race: White Female   History of Present Illness  The patient is a 71 year old female who presents with a breast mass. We are asked to see the patient in consultation by Dr. Aris Georgia to evaluate her for atypical duct hyperplasia of the left breast. The patient is a 71 year old white female who recently went for a routine screening mammogram on the left side. At that time she was found to have a small area of distortion in the upper outer quadrant. This was biopsied and came back as atypical duct hyperplasia. She does have a personal history of right breast cancer and underwent a right mastectomy in 1992. She also states that her sister died just a few weeks ago of breast cancer.   Past Surgical History  Breast Biopsy  Right. Breast Mass; Local Excision  Right. Colon Polyp Removal - Colonoscopy  Gallbladder Surgery - Laparoscopic  Gallbladder Surgery - Open  Mastectomy  Right.  Diagnostic Studies History  Colonoscopy  1-5 years ago Mammogram  1-3 years ago  Allergies  Latex  Iodinated Contrast Media  Codeine/Codeine Derivatives  Sulfacetamide Sodium-Sulfur *DERMATOLOGICALS*  Lisinopril *ANTIHYPERTENSIVES*  Levaquin *Fluoroquinolones**  Atorvastatin Calcium *ANTIHYPERLIPIDEMICS*  Allergies Reconciled   Medication History  Simvastatin (20MG  Tablet, Oral) Active. MetFORMIN HCl ER (Oral) Specific strength unknown - Active. Bisoprolol-Hydrochlorothiazide (Oral) Specific strength unknown - Active. Calcium (500MG  Tablet, 1 (one) Oral) Active. Multiple Vitamin (1 (one) Oral) Active. Aspirin (81MG  Capsule DR Part, Oral) Active. Medications Reconciled  Social History  Alcohol use  Occasional alcohol use. Caffeine use  Coffee. No drug use  Tobacco use  Former smoker.  Family History  Alcohol Abuse  Brother, Father. Breast Cancer   Sister. Cervical Cancer  Sister. Depression  Sister, Son. Diabetes Mellitus  Father. Heart Disease  Father, Mother. Hypertension  Father, Sister. Respiratory Condition  Mother. Thyroid problems  Sister.  Pregnancy / Birth History  Age at menarche  30 years. Age of menopause  48-55 Gravida  3 Maternal age  30-25 Para  3  Other Problems  Arthritis  Breast Cancer  Chronic Obstructive Lung Disease  Diabetes Mellitus  Diverticulosis  Emphysema Of Lung  General anesthesia - complications  Heart murmur  Hemorrhoids  High blood pressure  Home Oxygen Use  Hypercholesterolemia  Lump In Breast  Migraine Headache  Thyroid Disease  Umbilical Hernia Repair     Review of Systems  General Not Present- Appetite Loss, Chills, Fatigue, Fever, Night Sweats, Weight Gain and Weight Loss. Skin Present- Dryness. Not Present- Change in Wart/Mole, Hives, Jaundice, New Lesions, Non-Healing Wounds, Rash and Ulcer. HEENT Present- Hoarseness, Ringing in the Ears and Seasonal Allergies. Not Present- Earache, Hearing Loss, Nose Bleed, Oral Ulcers, Sinus Pain, Sore Throat, Visual Disturbances, Wears glasses/contact lenses and Yellow Eyes. Respiratory Not Present- Bloody sputum, Chronic Cough, Difficulty Breathing, Snoring and Wheezing. Breast Not Present- Breast Mass, Breast Pain, Nipple Discharge and Skin Changes. Cardiovascular Present- Shortness of Breath. Not Present- Chest Pain, Difficulty Breathing Lying Down, Leg Cramps, Palpitations, Rapid Heart Rate and Swelling of Extremities. Gastrointestinal Not Present- Abdominal Pain, Bloating, Bloody Stool, Change in Bowel Habits, Chronic diarrhea, Constipation, Difficulty Swallowing, Excessive gas, Gets full quickly at meals, Hemorrhoids, Indigestion, Nausea, Rectal Pain and Vomiting. Female Genitourinary Present- Frequency. Not Present- Nocturia, Painful Urination, Pelvic Pain and Urgency. Musculoskeletal Present- Joint Pain  and Joint Stiffness. Not Present- Back Pain, Muscle Pain, Muscle Weakness and Swelling  of Extremities. Neurological Not Present- Decreased Memory, Fainting, Headaches, Numbness, Seizures, Tingling, Tremor, Trouble walking and Weakness. Psychiatric Not Present- Anxiety, Bipolar, Change in Sleep Pattern, Depression, Fearful and Frequent crying. Endocrine Not Present- Cold Intolerance, Excessive Hunger, Hair Changes, Heat Intolerance, Hot flashes and New Diabetes. Hematology Not Present- Blood Thinners, Easy Bruising, Excessive bleeding, Gland problems, HIV and Persistent Infections.  Vitals  Weight: 241 lb Height: 64in Body Surface Area: 2.12 m Body Mass Index: 41.37 kg/m  Temp.: 97.44F  BP: 130/76 (Sitting, Left Arm, Standard)       Physical Exam General Mental Status-Alert. General Appearance-Consistent with stated age. Hydration-Well hydrated. Voice-Normal.  Head and Neck Head-normocephalic, atraumatic with no lesions or palpable masses. Trachea-midline. Thyroid Gland Characteristics - normal size and consistency.  Eye Eyeball - Bilateral-Extraocular movements intact. Sclera/Conjunctiva - Bilateral-No scleral icterus.  Chest and Lung Exam Chest and lung exam reveals -quiet, even and easy respiratory effort with no use of accessory muscles and on auscultation, normal breath sounds, no adventitious sounds and normal vocal resonance. Inspection Chest Wall - Normal. Back - normal.  Breast Note: There is no palpable mass of the left breast. There is no palpable mass of the right chest wall. There is no palpable axillary, supraclavicular, or cervical lymphadenopathy.   Cardiovascular Cardiovascular examination reveals -normal heart sounds, regular rate and rhythm with no murmurs and normal pedal pulses bilaterally.  Abdomen Note: The abdomen is soft with minimal tenderness. There appears to be a large ventral hernia but it is difficult to  tell where the fascial edges are. She is morbidly obese.   Neurologic Neurologic evaluation reveals -alert and oriented x 3 with no impairment of recent or remote memory. Mental Status-Normal.  Musculoskeletal Normal Exam - Left-Upper Extremity Strength Normal and Lower Extremity Strength Normal. Normal Exam - Right-Upper Extremity Strength Normal and Lower Extremity Strength Normal.  Lymphatic Head & Neck  General Head & Neck Lymphatics: Bilateral - Description - Normal. Axillary  General Axillary Region: Bilateral - Description - Normal. Tenderness - Non Tender. Femoral & Inguinal  Generalized Femoral & Inguinal Lymphatics: Bilateral - Description - Normal. Tenderness - Non Tender.    Assessment & Plan  ATYPICAL DUCTAL HYPERPLASIA OF LEFT BREAST (N60.92) Impression: The patient appears to have a small area of atypical ductal hyperplasia in the upper outer left breast. Because of its abnormal appearance and because it is considered a high risk lesion and I think it would be reasonable to remove this area. I have discussed with her in detail the risks and benefits of the operation to do this as well as some of the technical aspects and she understands and wishes to proceed. I will plan for a left breast radioactive seed localized lumpectomy. I will plan to refer her to the high risk clinic at the cancer center once her surgery is complete. She also appears to have a large ventral hernia. I will begin evaluating this with a CT scan of the abdomen and pelvis and once her breast has been dealt with we will talk about how to fix her hernia. Current Plans CT ABDOMEN AND PELVIS W CONTRAST [phone])

## 2016-12-22 NOTE — Anesthesia Preprocedure Evaluation (Signed)
Anesthesia Evaluation  Patient identified by MRN, date of birth, ID band Patient awake    Reviewed: Allergy & Precautions, H&P , NPO status , Patient's Chart, lab work & pertinent test results, reviewed documented beta blocker date and time   Airway Mallampati: III  TM Distance: >3 FB Neck ROM: Full    Dental no notable dental hx. (+) Teeth Intact, Dental Advisory Given   Pulmonary COPD,  COPD inhaler, former smoker,    Pulmonary exam normal breath sounds clear to auscultation       Cardiovascular hypertension, Pt. on medications and Pt. on home beta blockers + Valvular Problems/Murmurs AS  Rhythm:Regular Rate:Normal + Systolic murmurs    Neuro/Psych  Headaches, negative psych ROS   GI/Hepatic negative GI ROS, Neg liver ROS,   Endo/Other  diabetes, Type 2, Oral Hypoglycemic AgentsHypothyroidism Morbid obesity  Renal/GU negative Renal ROS  negative genitourinary   Musculoskeletal  (+) Arthritis , Osteoarthritis,    Abdominal   Peds  Hematology negative hematology ROS (+)   Anesthesia Other Findings   Reproductive/Obstetrics negative OB ROS                             Anesthesia Physical Anesthesia Plan  ASA: III  Anesthesia Plan: General   Post-op Pain Management:    Induction: Intravenous  PONV Risk Score and Plan: 4 or greater and Ondansetron, Dexamethasone, Propofol and Treatment may vary due to age or medical condition  Airway Management Planned: LMA  Additional Equipment:   Intra-op Plan:   Post-operative Plan: Extubation in OR  Informed Consent: I have reviewed the patients History and Physical, chart, labs and discussed the procedure including the risks, benefits and alternatives for the proposed anesthesia with the patient or authorized representative who has indicated his/her understanding and acceptance.   Dental advisory given  Plan Discussed with:  CRNA  Anesthesia Plan Comments:         Anesthesia Quick Evaluation

## 2016-12-22 NOTE — Anesthesia Postprocedure Evaluation (Signed)
Anesthesia Post Note  Patient: Shannon Schultz  Procedure(s) Performed: Procedure(s) (LRB): LEFT BREAST LUMPECTOMY WITH RADIOACTIVE SEED LOCALIZATION (Left)     Patient location during evaluation: PACU Anesthesia Type: General Level of consciousness: awake and alert Pain management: pain level controlled Vital Signs Assessment: post-procedure vital signs reviewed and stable Respiratory status: spontaneous breathing, nonlabored ventilation and respiratory function stable Cardiovascular status: blood pressure returned to baseline and stable Postop Assessment: no signs of nausea or vomiting Anesthetic complications: no    Last Vitals:  Vitals:   12/22/16 1030 12/22/16 1043  BP:  (!) 131/92  Pulse: 66 69  Resp: 12 15  Temp: 36.3 C     Last Pain:  Vitals:   12/22/16 1043  TempSrc:   PainSc: 2                  Ashlyn Cabler,W. EDMOND

## 2016-12-22 NOTE — Anesthesia Procedure Notes (Signed)
Procedure Name: LMA Insertion Date/Time: 12/22/2016 9:17 AM Performed by: Myna Bright Pre-anesthesia Checklist: Patient identified, Emergency Drugs available, Suction available and Patient being monitored Patient Re-evaluated:Patient Re-evaluated prior to inductionOxygen Delivery Method: Circle system utilized Preoxygenation: Pre-oxygenation with 100% oxygen Intubation Type: IV induction LMA: LMA inserted LMA Size: 4.0 Number of attempts: 1 Placement Confirmation: positive ETCO2 and breath sounds checked- equal and bilateral Tube secured with: Tape Dental Injury: Teeth and Oropharynx as per pre-operative assessment

## 2016-12-22 NOTE — Op Note (Signed)
12/22/2016  10:05 AM  PATIENT:  Shannon Schultz  71 y.o. female  PRE-OPERATIVE DIAGNOSIS:  ATYPICAL DUCTAL HYPERPLASIA OF LEFT BREAST   POST-OPERATIVE DIAGNOSIS:  ATYPICAL DUCTAL HYPERPLASIA OF LEFT BREAST   PROCEDURE:  Procedure(s): LEFT BREAST LUMPECTOMY WITH RADIOACTIVE SEED LOCALIZATION (Left)  SURGEON:  Surgeon(s) and Role:    * Jovita Kussmaul, MD - Primary  PHYSICIAN ASSISTANT:   ASSISTANTS: none   ANESTHESIA:   local and general  EBL:  Total I/O In: 500 [I.V.:500] Out: 10 [Blood:10]  BLOOD ADMINISTERED:none  DRAINS: none   LOCAL MEDICATIONS USED:  MARCAINE     SPECIMEN:  Source of Specimen:  left breast tissue with additional superior margin  DISPOSITION OF SPECIMEN:  PATHOLOGY  COUNTS:  YES  TOURNIQUET:  * No tourniquets in log *  DICTATION: .Dragon Dictation   After informed consent was obtained the patient was brought to the operating room and placed in the supine position on the operating room table. After adequate induction of general anesthesia the patient's left breast was prepped with ChloraPrep, allowed to dry, and draped in usual sterile manner. An appropriate timeout was performed. Previously an I-125 seed was placed in the upper outer portion of the left breast to mark an area of atypical duct hyperplasia. The neoprobe was set to I-125 in the area of radioactivity was readily identified. The area around this was infiltrated with quarter percent Marcaine. A curvilinear incision was made along the upper outer edge of the areola on the left breast with a 15 blade knife. The incision was carried through the skin and subcutaneous tissue sharply with electrocautery. Dissection was then carried towards the radioactive seeds under the direction of the neoprobe. Once we approached the radioactive seed more closely than a circular portion of breast tissue was excised sharply around the radioactive seed while checking the area of radioactivity frequently. Once the  specimen was removed it was oriented with the appropriate paint colors. A specimen radiograph showed the clip and seed to be within the specimen. An additional superior margin was taken sharply with the electrocautery and marked appropriately. Hemostasis was achieved using the Bovie electrocautery. The wound was infiltrated with more quarter percent Marcaine and irrigated with saline. The deep layer of the wound was then closed with layers of interrupted 3-0 Vicryl stitches. The skin was then closed with interrupted 4-0 Monocryl subcuticular stitches. Dermabond dressings were applied. The patient tolerated the procedure well. At the end of the case all needle sponge and instrument counts were correct. The patient was then awakened and taken to recovery in stable condition.  PLAN OF CARE: Discharge to home after PACU  PATIENT DISPOSITION:  PACU - hemodynamically stable.   Delay start of Pharmacological VTE agent (>24hrs) due to surgical blood loss or risk of bleeding: not applicable

## 2016-12-22 NOTE — Interval H&P Note (Signed)
History and Physical Interval Note:  12/22/2016 8:09 AM  Shannon Schultz  has presented today for surgery, with the diagnosis of L BR ADH  The various methods of treatment have been discussed with the patient and family. After consideration of risks, benefits and other options for treatment, the patient has consented to  Procedure(s): LEFT BREAST LUMPECTOMY WITH RADIOACTIVE SEED LOCALIZATION (Left) as a surgical intervention .  The patient's history has been reviewed, patient examined, no change in status, stable for surgery.  I have reviewed the patient's chart and labs.  Questions were answered to the patient's satisfaction.     TOTH III,PAUL S

## 2016-12-22 NOTE — Transfer of Care (Signed)
Immediate Anesthesia Transfer of Care Note  Patient: Shannon Schultz  Procedure(s) Performed: Procedure(s): LEFT BREAST LUMPECTOMY WITH RADIOACTIVE SEED LOCALIZATION (Left)  Patient Location: PACU  Anesthesia Type:General  Level of Consciousness: awake, alert , oriented and patient cooperative  Airway & Oxygen Therapy: Patient Spontanous Breathing and Patient connected to nasal cannula oxygen  Post-op Assessment: Report given to RN, Post -op Vital signs reviewed and stable and Patient moving all extremities  Post vital signs: Reviewed and stable  Last Vitals:  Vitals:   12/22/16 0700  BP: (!) 143/65  Pulse: 70  Resp: 20  Temp: 36.7 C    Last Pain:  Vitals:   12/22/16 0700  TempSrc: Oral         Complications: No apparent anesthesia complications

## 2016-12-23 ENCOUNTER — Encounter (HOSPITAL_COMMUNITY): Payer: Self-pay | Admitting: General Surgery

## 2016-12-23 ENCOUNTER — Encounter (HOSPITAL_COMMUNITY): Payer: Medicare HMO

## 2016-12-25 ENCOUNTER — Encounter (HOSPITAL_COMMUNITY)
Admission: RE | Admit: 2016-12-25 | Discharge: 2016-12-25 | Disposition: A | Discharge status: Home / Self Care | Payer: Medicare HMO | Source: Ambulatory Visit

## 2016-12-25 ENCOUNTER — Encounter (HOSPITAL_COMMUNITY): Payer: Medicare HMO

## 2016-12-25 ENCOUNTER — Encounter (HOSPITAL_COMMUNITY): Admission: RE | Admit: 2016-12-25 | Payer: Medicare HMO | Source: Ambulatory Visit

## 2016-12-25 VITALS — Wt 244.0 lb

## 2016-12-25 DIAGNOSIS — D485 Neoplasm of uncertain behavior of skin: Secondary | ICD-10-CM | POA: Diagnosis not present

## 2016-12-25 DIAGNOSIS — R0602 Shortness of breath: Secondary | ICD-10-CM | POA: Diagnosis not present

## 2016-12-25 DIAGNOSIS — I8312 Varicose veins of left lower extremity with inflammation: Secondary | ICD-10-CM | POA: Diagnosis not present

## 2016-12-25 DIAGNOSIS — I8311 Varicose veins of right lower extremity with inflammation: Secondary | ICD-10-CM | POA: Diagnosis not present

## 2016-12-25 DIAGNOSIS — L821 Other seborrheic keratosis: Secondary | ICD-10-CM | POA: Diagnosis not present

## 2016-12-25 DIAGNOSIS — I872 Venous insufficiency (chronic) (peripheral): Secondary | ICD-10-CM | POA: Diagnosis not present

## 2016-12-25 DIAGNOSIS — D225 Melanocytic nevi of trunk: Secondary | ICD-10-CM | POA: Diagnosis not present

## 2016-12-25 DIAGNOSIS — D2262 Melanocytic nevi of left upper limb, including shoulder: Secondary | ICD-10-CM | POA: Diagnosis not present

## 2016-12-25 NOTE — Progress Notes (Signed)
Daily Session Note  Patient Details  Name: Shannon Schultz MRN: [id number] Date of Birth: 05-30-1946 Referring Provider:     Pulmonary Rehab Walk Test from 10/07/2016 in [place]  Referring Provider  Dr. Tomi Bamberger      Encounter Date: 12/25/2016  Check In:     Session Check In - 12/25/16 1134      Check-In   Location [facility]   Staff Present Su Hilt, MS, ACSM RCEP, Exercise Physiologist;Lisa Colletta [place], RN, [place] physician immediately available to respond to emergencies [facility] immediately available   Physician(s) Dr. Allyson Sabal   Medication changes reported     No   Fall or balance concerns reported    No   Tobacco Cessation No Change   Warm-up and Cool-down Performed as group-led instruction   Resistance Training Performed Yes   VAD Patient? No     Pain Assessment   Currently in Pain? No/denies   Pain Score 0-No pain      Capillary Blood Glucose: No results found for this or any previous visit (from the past 24 hour(s)).     POCT Glucose - 12/25/16 1223      POCT Blood Glucose   Pre-Exercise 189 mg/dL   Post-Exercise 135 mg/dL         Exercise Prescription Changes - 12/25/16 1200      Response to Exercise   Blood Pressure (Admit) 120/64   Blood Pressure (Exercise) 154/68   Blood Pressure (Exit) 102/60   Heart Rate (Admit) 81 bpm   Heart Rate (Exercise) 98 bpm   Heart Rate (Exit) 87 bpm   Oxygen Saturation (Admit) 93 %   Oxygen Saturation (Exercise) 93 %   Oxygen Saturation (Exit) 95 %   Rating of Perceived Exertion (Exercise) 13   Perceived Dyspnea (Exercise) 2   Duration Progress to 45 minutes of aerobic exercise without signs/symptoms of physical distress   Intensity THRR unchanged     Progression   Progression Continue to progress workloads to maintain intensity without signs/symptoms of physical distress.     Resistance Training   Training  Prescription Yes   Weight orange bands   Reps 10-15   Time 10 Minutes     Interval Training   Interval Training No     Oxygen   Oxygen Continuous   Liters 2     Bike   Minutes 17     NuStep   Level 6   Minutes 17   METs 2     Track   Laps 8   Minutes 17      History  Smoking Status  . Former Smoker  . Packs/day: 1.00  . Years: 46.00  . Types: Cigarettes  . Quit date: 05/31/2011  Smokeless Tobacco  . Never Used    Goals Met:  Exercise tolerated well No report of cardiac concerns or symptoms Strength training completed today  Goals Unmet:  Not Applicable  Comments: Service time is from 10:30a to 12:05p    Dr. Rush Farmer is Medical Director for Pulmonary Rehab at [facility].

## 2016-12-30 ENCOUNTER — Encounter (HOSPITAL_COMMUNITY)
Admission: RE | Admit: 2016-12-30 | Discharge: 2016-12-30 | Disposition: A | Discharge status: Home / Self Care | Payer: Medicare HMO | Source: Ambulatory Visit | Attending: Family Medicine | Admitting: Family Medicine

## 2016-12-30 VITALS — Wt 242.5 lb

## 2016-12-30 DIAGNOSIS — R0602 Shortness of breath: Secondary | ICD-10-CM | POA: Diagnosis not present

## 2016-12-30 NOTE — Progress Notes (Signed)
Daily Session Note  Patient Details  Name: Shannon Schultz MRN: [id number] Date of Birth: 02-09-1946 Referring Provider:     Pulmonary Rehab Walk Test from 10/07/2016 in [place]  Referring Provider  Dr. Tomi Bamberger      Encounter Date: 12/30/2016  Check In:     Session Check In - 12/30/16 1035      Check-In   Location [facility]   Staff Present Trish Fountain, RN, Maxcine Ham, RN, BSN;Molly diVincenzo, MS, ACSM RCEP, Exercise Physiologist;Lisa Ysidro Evert, RN   Supervising physician immediately available to respond to emergencies [facility] immediately available   Physician(s) Dr. Allyson Sabal   Medication changes reported     No   Fall or balance concerns reported    No   Tobacco Cessation No Change   Warm-up and Cool-down Performed as group-led instruction   Resistance Training Performed Yes   VAD Patient? No     Pain Assessment   Currently in Pain? No/denies   Multiple Pain Sites No      Capillary Blood Glucose: No results found for this or any previous visit (from the past 24 hour(s)).     POCT Glucose - 12/30/16 1235      POCT Blood Glucose   Pre-Exercise 195 mg/dL   Post-Exercise 103 mg/dL         Exercise Prescription Changes - 12/30/16 1235      Response to Exercise   Blood Pressure (Admit) 108/60   Blood Pressure (Exercise) 164/72   Blood Pressure (Exit) 124/70   Heart Rate (Admit) 83 bpm   Heart Rate (Exercise) 118 bpm   Heart Rate (Exit) 85 bpm   Oxygen Saturation (Admit) 93 %   Oxygen Saturation (Exercise) 95 %   Oxygen Saturation (Exit) 94 %   Rating of Perceived Exertion (Exercise) 13   Perceived Dyspnea (Exercise) 2   Duration Progress to 45 minutes of aerobic exercise without signs/symptoms of physical distress   Intensity THRR unchanged     Progression   Progression Continue to progress workloads to maintain intensity without signs/symptoms of physical distress.     Resistance Training   Training Prescription Yes   Weight orange bands   Reps 10-15   Time 10 Minutes     Interval Training   Interval Training No     Oxygen   Oxygen Continuous   Liters 2     Bike   Level 4.5   Minutes 17     NuStep   Level 6   Minutes 17   METs 2.2     Track   Laps 9   Minutes 17      History  Smoking Status  . Former Smoker  . Packs/day: 1.00  . Years: 46.00  . Types: Cigarettes  . Quit date: 05/31/2011  Smokeless Tobacco  . Never Used    Goals Met:  Independence with exercise equipment Improved SOB with ADL's Using PLB without cueing & demonstrates good technique Exercise tolerated well No report of cardiac concerns or symptoms Strength training completed today  Goals Unmet:  Not Applicable  Comments: Service time is from 1030 to 1210   Dr. Rush Farmer is Medical Director for Pulmonary Rehab at [facility].

## 2017-01-01 ENCOUNTER — Encounter (HOSPITAL_COMMUNITY)
Admission: RE | Admit: 2017-01-01 | Discharge: 2017-01-01 | Disposition: A | Discharge status: Home / Self Care | Payer: Medicare HMO | Source: Ambulatory Visit | Attending: Family Medicine | Admitting: Family Medicine

## 2017-01-01 DIAGNOSIS — R0602 Shortness of breath: Secondary | ICD-10-CM | POA: Diagnosis not present

## 2017-01-02 DIAGNOSIS — J441 Chronic obstructive pulmonary disease with (acute) exacerbation: Secondary | ICD-10-CM | POA: Diagnosis not present

## 2017-01-02 DIAGNOSIS — J449 Chronic obstructive pulmonary disease, unspecified: Secondary | ICD-10-CM | POA: Diagnosis not present

## 2017-01-06 ENCOUNTER — Encounter (HOSPITAL_COMMUNITY): Payer: Medicare HMO

## 2017-01-07 ENCOUNTER — Encounter (HOSPITAL_COMMUNITY): Payer: Medicare HMO

## 2017-01-07 ENCOUNTER — Encounter: Payer: Self-pay | Admitting: Family Medicine

## 2017-01-07 NOTE — Patient Instructions (Addendum)
Shannon Schultz  01/07/2017   Your procedure is scheduled on: 01/12/17  Report to [facility] Main  Entrance Take [place]  elevators to 3rd floor to  [place] at     1:00 PM.     CLEAR LIQUID DIET   Foods Allowed                                                                     Foods Excluded  Coffee and tea, regular and decaf                             liquids that you cannot  Plain Jell-O in any flavor                                             see through such as: Fruit ices (not with fruit pulp)                                     milk, soups, orange juice  Iced Popsicles                                    All solid food Carbonated beverages, regular and diet                                    Cranberry, grape and apple juices Sports drinks like Gatorade Lightly seasoned clear broth or consume(fat free) Sugar, honey syrup  Sample Menu Breakfast                                Lunch                                     Supper Cranberry juice                    Beef broth                            Chicken broth Jell-O                                     Grape juice                           Apple juice Coffee or tea                        Jell-O  Popsicle                                                Coffee or tea                        Coffee or tea  _____________________________________________________________________    Call this number if you have problems the morning of surgery [phone]   Remember: ONLY 1 PERSON MAY GO WITH YOU TO SHORT STAY TO GET  READY MORNING OF YOUR SURGERY.  Do not eat food  :After Midnight.Clear liguids until 900 am then nothing by mouth     Take these medicines the morning of surgery with A SIP OF WATER:valtrex, cetirizine, inhaler if needed and bring  DO NOT TAKE ANY DIABETIC MEDICATIONS DAY OF YOUR SURGERY                               You may not have any metal on  your body including hair pins and              piercings  Do not wear jewelry, make-up, lotions, powders or perfumes, deodorant             Do not wear nail polish.  Do not shave  48 hours prior to surgery.              Do not bring valuables to the hospital. [place].  Contacts, dentures or bridgework may not be worn into surgery.  Leave suitcase in the car. After surgery it may be brought to your room.     Patients discharged the day of surgery will not be allowed to drive home.  Name and phone number of your driver:  Special Instructions: N/A              Please read over the following fact sheets you were given: _____________________________________________________________________             [facility] - Preparing for Surgery Before surgery, you can play an important role.  Because skin is not sterile, your skin needs to be as free of germs as possible.  You can reduce the number of germs on your skin by washing with CHG (chlorahexidine gluconate) soap before surgery.  CHG is an antiseptic cleaner which kills germs and bonds with the skin to continue killing germs even after washing. Please DO NOT use if you have an allergy to CHG or antibacterial soaps.  If your skin becomes reddened/irritated stop using the CHG and inform your nurse when you arrive at Short Stay. Do not shave (including legs and underarms) for at least 48 hours prior to the first CHG shower.  You may shave your face/neck. Please follow these instructions carefully:  1.  Shower with CHG Soap the night before surgery and the  morning of Surgery.  2.  If you choose to wash your hair, wash your hair first as usual with your  normal  shampoo.  3.  After you shampoo, rinse your hair and body thoroughly to remove the  shampoo.  4.  Use CHG as you would any other liquid soap.  You can apply chg directly  to the skin and wash                        Gently with a scrungie or clean washcloth.  5.  Apply the CHG Soap to your body ONLY FROM THE NECK DOWN.   Do not use on face/ open                           Wound or open sores. Avoid contact with eyes, ears mouth and genitals (private parts).                       Wash face,  Genitals (private parts) with your normal soap.             6.  Wash thoroughly, paying special attention to the area where your surgery  will be performed.  7.  Thoroughly rinse your body with warm water from the neck down.  8.  DO NOT shower/wash with your normal soap after using and rinsing off  the CHG Soap.                9.  Pat yourself dry with a clean towel.            10.  Wear clean pajamas.            11.  Place clean sheets on your bed the night of your first shower and do not  sleep with pets. Day of Surgery : Do not apply any lotions/deodorants the morning of surgery.  Please wear clean clothes to the hospital/surgery center.  FAILURE TO FOLLOW THESE INSTRUCTIONS MAY RESULT IN THE CANCELLATION OF YOUR SURGERY PATIENT SIGNATURE_________________________________  NURSE SIGNATURE__________________________________  ________________________________________________________________________

## 2017-01-08 ENCOUNTER — Encounter (HOSPITAL_COMMUNITY): Payer: Self-pay

## 2017-01-08 ENCOUNTER — Encounter (HOSPITAL_COMMUNITY)
Admission: RE | Admit: 2017-01-08 | Discharge: 2017-01-08 | Disposition: A | Discharge status: Home / Self Care | Payer: Medicare HMO | Source: Ambulatory Visit | Attending: Urology | Admitting: Urology

## 2017-01-08 ENCOUNTER — Encounter (HOSPITAL_COMMUNITY): Payer: Medicare HMO

## 2017-01-08 DIAGNOSIS — D414 Neoplasm of uncertain behavior of bladder: Secondary | ICD-10-CM | POA: Insufficient documentation

## 2017-01-08 DIAGNOSIS — Z01818 Encounter for other preprocedural examination: Secondary | ICD-10-CM | POA: Insufficient documentation

## 2017-01-08 LAB — CBC
HCT: 39.1 % (ref 36.0–46.0)
Hemoglobin: 12.6 g/dL (ref 12.0–15.0)
MCH: 29.4 pg (ref 26.0–34.0)
MCHC: 32.2 g/dL (ref 30.0–36.0)
MCV: 91.1 fL (ref 78.0–100.0)
Platelets: 125 10*3/uL — ABNORMAL LOW (ref 150–400)
RBC: 4.29 MIL/uL (ref 3.87–5.11)
RDW: 15.3 % (ref 11.5–15.5)
WBC: 4.8 10*3/uL (ref 4.0–10.5)

## 2017-01-08 LAB — BASIC METABOLIC PANEL
Anion gap: 8 (ref 5–15)
BUN: 19 mg/dL (ref 6–20)
CO2: 29 mmol/L (ref 22–32)
Calcium: 9.9 mg/dL (ref 8.9–10.3)
Chloride: 105 mmol/L (ref 101–111)
Creatinine, Ser: 0.69 mg/dL (ref 0.44–1.00)
GFR calc Af Amer: >60 mL/min (ref >60)
GFR calc non Af Amer: >60 mL/min (ref >60)
Glucose, Bld: 246 mg/dL — ABNORMAL HIGH (ref 65–99)
Potassium: 4.8 mmol/L (ref 3.5–5.1)
Sodium: 142 mmol/L (ref 135–145)

## 2017-01-08 LAB — GLUCOSE, CAPILLARY: Glucose-Capillary: 231 mg/dL — ABNORMAL HIGH (ref 65–99)

## 2017-01-08 NOTE — Progress Notes (Signed)
Informed Dr. Sabra Heck of pt. Mild to moderate aortic stenosis, and pt. 's previous surgery 12/22/16. No further orders.

## 2017-01-08 NOTE — Progress Notes (Signed)
ekg 12/17/16 epic Labs 12/17/16 cbc,bmp, hgba1c epic Echo 04/08/16 mild to mod. Aortic stenosis epic CT chest 09/11/16 epic

## 2017-01-08 NOTE — Progress Notes (Signed)
CBC routed to DR. Borden done 01/08/17 via epic.

## 2017-01-09 NOTE — H&P (Signed)
CC/HPI: Bladder tumor    Shannon Schultz is a 71 year old female seen at the request of Dr. Lorine Bears for a possible bladder tumor. She does have a distant history of breast cancer status post mastectomy in 1992. She recently had an abnormal mammogram and is scheduled to undergo surgery for further evaluation in the near future. In addition, she has an abdominal hernia and underwent evaluation for this with a CT scan of the abdomen and pelvis without contrast on 12/01/16. Incidentally, this noted a 1.4 cm hyperdense lesion within the bladder suspicious for a possible bladder tumor. She has denied hematuria. She has no personal or family history of bladder cancer or other GU malignancy. She did smoke 1 pack a day for 46 years prior to quitting 6 years ago. Cystoscopy was performed and revealed a 1.5 cm right sided bladder tumor.  Her past medical history is otherwise significant for history of diabetes, hyperlipidemia, hypertension, hypothyroidism, and arthritis.     ALLERGIES: Latex Lipitor SULFA    MEDICATIONS: None   GU PSH: None   NON-GU PSH: Breast mastectomy Cholecystectomy (laparoscopic) Hernia Repair    GU PMH: None   NON-GU PMH: Arthritis Cardiac murmur, unspecified Diabetes Type 2 Hypertension Hypothyroidism    FAMILY HISTORY: 3 Son's - Runs in Family   SOCIAL HISTORY: Marital Status: Married Current Smoking Status: Patient does not smoke anymore.   Tobacco Use Assessment Completed: Used Tobacco in last 30 days? Does not drink anymore.  Drinks 4+ caffeinated drinks per day.    REVIEW OF SYSTEMS:    GU Review Female:   Patient reports frequent urination and get up at night to urinate. Patient denies hard to postpone urination, burning /pain with urination, leakage of urine, stream starts and stops, trouble starting your stream, have to strain to urinate, and currently pregnant.  Gastrointestinal (Upper):   Patient denies nausea and vomiting.  Gastrointestinal  (Lower):   Patient denies diarrhea and constipation.  Constitutional:   Patient reports fatigue. Patient denies fever, night sweats, and weight loss.  Skin:   Patient denies itching and skin rash/ lesion.  Eyes:   Patient denies blurred vision and double vision.  Ears/ Nose/ Throat:   Patient reports sore throat and sinus problems.   Hematologic/Lymphatic:   Patient denies swollen glands and easy bruising.  Cardiovascular:   Patient reports leg swelling. Patient denies chest pains.  Respiratory:   Patient reports cough and shortness of breath.   Endocrine:   Patient denies excessive thirst.  Musculoskeletal:   Patient denies back pain and joint pain.  Neurological:   Patient denies headaches and dizziness.  Psychologic:   Patient denies depression and anxiety.   VITAL SIGNS:    Weight 240 lb / 108.86 kg  Height 64 in / 162.56 cm  BMI 41.2 kg/m   GU PHYSICAL EXAMINATION:    External Genitalia: No hirsutism, no rash, no scarring, no cyst, no erythematous lesion, no papular lesion, no blanched lesion, no warty lesion. No edema.  Urethral Meatus: Normal size. Normal position. No discharge.  Urethra: No tenderness, no mass, no scarring. No hypermobility. No leakage.  Vagina: No atrophy, no stenosis. No rectocele. No cystocele. No enterocele.   MULTI-SYSTEM PHYSICAL EXAMINATION:    Constitutional: Well-nourished. No physical deformities. Normally developed. Good grooming.  Neck: Neck symmetrical, not swollen. Normal tracheal position.  Respiratory: No labored breathing, no use of accessory muscles.   Cardiovascular: Normal temperature, normal extremity pulses, no swelling, no varicosities.  Lymphatic: No  enlargement of neck, axillae, groin.  Skin: No paleness, no jaundice, no cyanosis. No lesion, no ulcer, no rash.  Neurologic / Psychiatric: Oriented to time, oriented to place, oriented to person. No depression, no anxiety, no agitation.  Gastrointestinal: No mass, no tenderness, no  rigidity, non obese abdomen.  Eyes: Normal conjunctivae. Normal eyelids.  Ears, Nose, Mouth, and Throat: Left ear no scars, no lesions, no masses. Right ear no scars, no lesions, no masses. Nose no scars, no lesions, no masses. Normal hearing. Normal lips.  Musculoskeletal: Normal gait and station of head and neck.     PAST DATA REVIEWED:  Source Of History:  Patient  Records Review:   Previous Patient Records  Urine Test Review:   Urinalysis  X-Ray Review: C.T. Abdomen/Pelvis: Reviewed Films. Findings as dictated above.   ASSESSMENT:      ICD-10 Details  1 GU:   Bladder tumor/neoplasm - D41.4    PLAN:          1. Bladder tumor:  We discussed the need to proceed with transurethral resection of the bladder tumor and retrograde pyelography as a next step to fully assess her urothelium. We reviewed the potential risks, complications and the expected recovery process associated with this procedure. I have also recommended that she proceed with postoperative instillation of mitomycin and we have discussed the potential risks and complications of this therapy.

## 2017-01-12 ENCOUNTER — Encounter (HOSPITAL_COMMUNITY): Payer: Self-pay | Admitting: Certified Registered"

## 2017-01-12 ENCOUNTER — Encounter (HOSPITAL_COMMUNITY)
Admission: RE | Disposition: A | Discharge status: Home / Self Care | Payer: Self-pay | Source: Ambulatory Visit | Attending: Urology

## 2017-01-12 ENCOUNTER — Ambulatory Visit (HOSPITAL_COMMUNITY)
Admission: RE | Admit: 2017-01-12 | Discharge: 2017-01-12 | Disposition: A | Discharge status: Home / Self Care | Payer: Medicare HMO | Source: Ambulatory Visit | Attending: Urology | Admitting: Urology

## 2017-01-12 ENCOUNTER — Ambulatory Visit (HOSPITAL_COMMUNITY): Payer: Medicare HMO | Admitting: Certified Registered"

## 2017-01-12 ENCOUNTER — Ambulatory Visit (HOSPITAL_COMMUNITY): Payer: Medicare HMO

## 2017-01-12 DIAGNOSIS — Z6841 Body Mass Index (BMI) 40.0 and over, adult: Secondary | ICD-10-CM | POA: Diagnosis not present

## 2017-01-12 DIAGNOSIS — M1990 Unspecified osteoarthritis, unspecified site: Secondary | ICD-10-CM | POA: Insufficient documentation

## 2017-01-12 DIAGNOSIS — E039 Hypothyroidism, unspecified: Secondary | ICD-10-CM | POA: Diagnosis not present

## 2017-01-12 DIAGNOSIS — Z888 Allergy status to other drugs, medicaments and biological substances status: Secondary | ICD-10-CM | POA: Insufficient documentation

## 2017-01-12 DIAGNOSIS — Z87891 Personal history of nicotine dependence: Secondary | ICD-10-CM | POA: Diagnosis not present

## 2017-01-12 DIAGNOSIS — Z7951 Long term (current) use of inhaled steroids: Secondary | ICD-10-CM | POA: Diagnosis not present

## 2017-01-12 DIAGNOSIS — E785 Hyperlipidemia, unspecified: Secondary | ICD-10-CM | POA: Insufficient documentation

## 2017-01-12 DIAGNOSIS — Z9889 Other specified postprocedural states: Secondary | ICD-10-CM | POA: Insufficient documentation

## 2017-01-12 DIAGNOSIS — I10 Essential (primary) hypertension: Secondary | ICD-10-CM | POA: Diagnosis not present

## 2017-01-12 DIAGNOSIS — C678 Malignant neoplasm of overlapping sites of bladder: Secondary | ICD-10-CM | POA: Diagnosis not present

## 2017-01-12 DIAGNOSIS — Z882 Allergy status to sulfonamides status: Secondary | ICD-10-CM | POA: Insufficient documentation

## 2017-01-12 DIAGNOSIS — D494 Neoplasm of unspecified behavior of bladder: Secondary | ICD-10-CM | POA: Diagnosis not present

## 2017-01-12 DIAGNOSIS — J449 Chronic obstructive pulmonary disease, unspecified: Secondary | ICD-10-CM | POA: Diagnosis not present

## 2017-01-12 DIAGNOSIS — Z853 Personal history of malignant neoplasm of breast: Secondary | ICD-10-CM | POA: Insufficient documentation

## 2017-01-12 DIAGNOSIS — E119 Type 2 diabetes mellitus without complications: Secondary | ICD-10-CM | POA: Diagnosis not present

## 2017-01-12 DIAGNOSIS — Z9049 Acquired absence of other specified parts of digestive tract: Secondary | ICD-10-CM | POA: Diagnosis not present

## 2017-01-12 DIAGNOSIS — C672 Malignant neoplasm of lateral wall of bladder: Secondary | ICD-10-CM | POA: Diagnosis not present

## 2017-01-12 DIAGNOSIS — Z79899 Other long term (current) drug therapy: Secondary | ICD-10-CM | POA: Insufficient documentation

## 2017-01-12 DIAGNOSIS — Z9010 Acquired absence of unspecified breast and nipple: Secondary | ICD-10-CM | POA: Insufficient documentation

## 2017-01-12 DIAGNOSIS — Z7984 Long term (current) use of oral hypoglycemic drugs: Secondary | ICD-10-CM | POA: Insufficient documentation

## 2017-01-12 DIAGNOSIS — Z91040 Latex allergy status: Secondary | ICD-10-CM | POA: Insufficient documentation

## 2017-01-12 DIAGNOSIS — E7800 Pure hypercholesterolemia, unspecified: Secondary | ICD-10-CM | POA: Diagnosis not present

## 2017-01-12 HISTORY — PX: TRANSURETHRAL RESECTION OF BLADDER TUMOR WITH MITOMYCIN-C: SHX6459

## 2017-01-12 HISTORY — PX: CYSTOSCOPY W/ RETROGRADES: SHX1426

## 2017-01-12 LAB — GLUCOSE, CAPILLARY: Glucose-Capillary: 123 mg/dL — ABNORMAL HIGH (ref 65–99)

## 2017-01-12 SURGERY — TRANSURETHRAL RESECTION OF BLADDER TUMOR WITH MITOMYCIN-C
Anesthesia: General

## 2017-01-12 MED ORDER — SUGAMMADEX SODIUM 200 MG/2ML IV SOLN
INTRAVENOUS | Status: AC
  Filled 2017-01-12: qty 2

## 2017-01-12 MED ORDER — FENTANYL CITRATE (PF) 100 MCG/2ML IJ SOLN: 25.0000 ug | INTRAMUSCULAR | Status: DC | PRN

## 2017-01-12 MED ORDER — FENTANYL CITRATE (PF) 100 MCG/2ML IJ SOLN
INTRAMUSCULAR | Status: DC | PRN
  Administered 2017-01-12 (×2): 50 ug via INTRAVENOUS

## 2017-01-12 MED ORDER — DIPHENHYDRAMINE HCL 50 MG/ML IJ SOLN
INTRAMUSCULAR | Status: DC | PRN
Start: 2017-01-12 — End: 2017-01-12
  Administered 2017-01-12: 25 mg via INTRAVENOUS

## 2017-01-12 MED ORDER — LACTATED RINGERS IV SOLN
INTRAVENOUS | Status: DC
  Administered 2017-01-12: 14:00:00 via INTRAVENOUS

## 2017-01-12 MED ORDER — PROMETHAZINE HCL 25 MG/ML IJ SOLN: 6.2500 mg | INTRAMUSCULAR | Status: DC | PRN

## 2017-01-12 MED ORDER — KETOROLAC TROMETHAMINE 30 MG/ML IJ SOLN: 15.0000 mg | Freq: Once | INTRAMUSCULAR | Status: DC | PRN

## 2017-01-12 MED ORDER — TRAMADOL HCL 50 MG PO TABS: 50.0000 mg | ORAL_TABLET | Freq: Four times a day (QID) | ORAL | 0 refills | Status: DC | PRN

## 2017-01-12 MED ORDER — CEFAZOLIN SODIUM-DEXTROSE 2-4 GM/100ML-% IV SOLN
2.0000 g | INTRAVENOUS | Status: AC
  Administered 2017-01-12: 2 g via INTRAVENOUS
  Filled 2017-01-12: qty 100

## 2017-01-12 MED ORDER — SODIUM CHLORIDE 0.9 % IR SOLN
Status: DC | PRN
  Administered 2017-01-12: 6000 mL

## 2017-01-12 MED ORDER — 0.9 % SODIUM CHLORIDE (POUR BTL) OPTIME
TOPICAL | Status: DC | PRN
  Administered 2017-01-12: 1000 mL

## 2017-01-12 MED ORDER — ROCURONIUM BROMIDE 50 MG/5ML IV SOSY
PREFILLED_SYRINGE | INTRAVENOUS | Status: AC
  Filled 2017-01-12: qty 5

## 2017-01-12 MED ORDER — PROPOFOL 10 MG/ML IV BOLUS
INTRAVENOUS | Status: AC
  Filled 2017-01-12: qty 20

## 2017-01-12 MED ORDER — BISOPROLOL FUMARATE 5 MG PO TABS
10.0000 mg | ORAL_TABLET | Freq: Once | ORAL | Status: AC
  Administered 2017-01-12: 10 mg via ORAL
  Filled 2017-01-12: qty 1
  Filled 2017-01-12: qty 2

## 2017-01-12 MED ORDER — MITOMYCIN CHEMO FOR BLADDER INSTILLATION 40 MG
40.0000 mg | Freq: Once | INTRAVENOUS | Status: DC
  Filled 2017-01-12: qty 40

## 2017-01-12 MED ORDER — ROCURONIUM BROMIDE 50 MG/5ML IV SOSY
PREFILLED_SYRINGE | INTRAVENOUS | Status: AC
  Filled 2017-01-12: qty 10

## 2017-01-12 MED ORDER — FENTANYL CITRATE (PF) 100 MCG/2ML IJ SOLN
INTRAMUSCULAR | Status: AC
  Filled 2017-01-12: qty 2

## 2017-01-12 MED ORDER — IOHEXOL 300 MG/ML  SOLN
INTRAMUSCULAR | Status: DC | PRN
  Administered 2017-01-12: 15 mL

## 2017-01-12 MED ORDER — LIDOCAINE 2% (20 MG/ML) 5 ML SYRINGE
INTRAMUSCULAR | Status: DC | PRN
  Administered 2017-01-12: 100 mg via INTRAVENOUS

## 2017-01-12 MED ORDER — LIDOCAINE 2% (20 MG/ML) 5 ML SYRINGE
INTRAMUSCULAR | Status: AC
  Filled 2017-01-12: qty 5

## 2017-01-12 MED ORDER — DEXAMETHASONE SODIUM PHOSPHATE 10 MG/ML IJ SOLN
INTRAMUSCULAR | Status: DC | PRN
  Administered 2017-01-12: 10 mg via INTRAVENOUS

## 2017-01-12 MED ORDER — MITOMYCIN CHEMO FOR BLADDER INSTILLATION 40 MG
40.0000 mg | Freq: Once | INTRAVENOUS | Status: DC
  Administered 2017-01-12: 40 mg via INTRAVESICAL

## 2017-01-12 MED ORDER — ONDANSETRON HCL 4 MG/2ML IJ SOLN
INTRAMUSCULAR | Status: DC | PRN
  Administered 2017-01-12: 4 mg via INTRAVENOUS

## 2017-01-12 MED ORDER — PROPOFOL 10 MG/ML IV BOLUS
INTRAVENOUS | Status: DC | PRN
  Administered 2017-01-12: 180 mg via INTRAVENOUS

## 2017-01-12 MED ORDER — PHENAZOPYRIDINE HCL 200 MG PO TABS: 200.0000 mg | ORAL_TABLET | Freq: Three times a day (TID) | ORAL | 0 refills | Status: DC | PRN

## 2017-01-12 MED ORDER — SUGAMMADEX SODIUM 500 MG/5ML IV SOLN
INTRAVENOUS | Status: AC
  Filled 2017-01-12: qty 5

## 2017-01-12 MED ORDER — SUGAMMADEX SODIUM 500 MG/5ML IV SOLN
INTRAVENOUS | Status: DC | PRN
  Administered 2017-01-12: 225 mg via INTRAVENOUS

## 2017-01-12 SURGICAL SUPPLY — 16 items
BAG URINE DRAINAGE (UROLOGICAL SUPPLIES) ×1 IMPLANT
BAG URO CATCHER STRL LF (MISCELLANEOUS) ×3 IMPLANT
CATH FOLEY 2WAY SLVR  5CC 16FR (CATHETERS)
CATH FOLEY 2WAY SLVR 5CC 16FR (CATHETERS) IMPLANT
CATH INTERMIT  6FR 70CM (CATHETERS) ×3 IMPLANT
CATH SILICONE 16FRX5CC (CATHETERS) ×1 IMPLANT
CLOTH BEACON ORANGE TIMEOUT ST (SAFETY) ×3 IMPLANT
COVER SURGICAL LIGHT HANDLE (MISCELLANEOUS) ×3 IMPLANT
GLOVE BIOGEL M STRL SZ7.5 (GLOVE) ×3 IMPLANT
GOWN STRL REUS W/TWL LRG LVL3 (GOWN DISPOSABLE) ×6 IMPLANT
GUIDEWIRE STR DUAL SENSOR (WIRE) ×3 IMPLANT
LOOP CUT BIPOLAR 24F LRG (ELECTROSURGICAL) ×1 IMPLANT
MANIFOLD NEPTUNE II (INSTRUMENTS) ×3 IMPLANT
PACK CYSTO (CUSTOM PROCEDURE TRAY) ×3 IMPLANT
SYRINGE 12CC LL (MISCELLANEOUS) ×1 IMPLANT
TUBING CONNECTING 10 (TUBING) ×3 IMPLANT

## 2017-01-12 NOTE — Anesthesia Postprocedure Evaluation (Signed)
Anesthesia Post Note  Patient: Shannon Schultz  Procedure(s) Performed: Procedure(s) (LRB): TRANSURETHRAL RESECTION OF BLADDER TUMOR WITH POSSIBLE POST OPERATIVE INSTILLATION OF MITOMYCIN-C (N/A) CYSTOSCOPY WITH RETROGRADE PYELOGRAM/ EXAM UNDER ANESTHESIA (Bilateral)     Patient location during evaluation: PACU Anesthesia Type: General Level of consciousness: awake and alert Pain management: pain level controlled Vital Signs Assessment: post-procedure vital signs reviewed and stable Respiratory status: spontaneous breathing, nonlabored ventilation, respiratory function stable and patient connected to nasal cannula oxygen Cardiovascular status: blood pressure returned to baseline and stable Postop Assessment: no signs of nausea or vomiting Anesthetic complications: no    Last Vitals:  Vitals:   01/12/17 1730 01/12/17 1745  BP: 133/84 (!) 141/63  Pulse: 82 84  Resp: 16 16  Temp:      Last Pain:  Vitals:   01/12/17 1327  TempSrc:   PainSc: 1                  Eh Sauseda S

## 2017-01-12 NOTE — Op Note (Signed)
Preoperative diagnosis: 1. Bladder tumor (1.5 cm)  Postoperative diagnosis:  1. Bladder tumor (1.5 cm)  Procedure:  1. Cystoscopy 2. Transurethral resection of bladder tumor (1.5 cm) 3. Bilateral retrograde pyelography with interpretation 4. Pelvic exam under anesthesia 5. Postoperative instillation of intravesical Mitomycin C  Surgeon: Roxy Horseman, Brooke Bonito. M.D.  Anesthesia: General  Complications: None  Intraoperative findings:  1. Bladder tumor: 1.5 cm right lateral papillary bladder tumor 2. Retrograde pyelography: Normal renal collecting systems and ureters bilaterally.  EBL: Minimal  Specimens: 1. Right lateral bladder tumor 2. Base of right lateral bladder tumor  Disposition of specimens: Pathology  Indication: Shannon Schultz is a patient who was found to have a bladder tumor. After reviewing the management options for treatment, he elected to proceed with the above surgical procedure(s). We have discussed the potential benefits and risks of the procedure, side effects of the proposed treatment, the likelihood of the patient achieving the goals of the procedure, and any potential problems that might occur during the procedure or recuperation. Informed consent has been obtained.  Description of procedure:  The patient was taken to the operating room and general anesthesia was induced.  The patient was placed in the dorsal lithotomy position, prepped and draped in the usual sterile fashion, and preoperative antibiotics were administered. A preoperative time-out was performed.   A pelvic exam under anesthesia with the patient paralyzed revealed no pelvic masses.  Cystourethroscopy was performed.  The patient's urethra was examined and was normal.   The bladder was then systematically examined in its entirety. There was a solitary 1.5 cm papillary tumor on the right lateral bladder wall lateral to the right ureteral orifice.  No other abnormalities were noted.  Attention  then turned to the right ureteral orifice and a ureteral catheter was used to intubate the ureteral orifice.  Omnipaque contrast was injected through the ureteral catheter and a retrograde pyelogram was performed with findings as dictated above.  Diphenhydramine had been administered to her history of IV contrast allergy.  Attention then turned to the left ureteral orifice and a ureteral catheter was used to intubate the ureteral orifice.  Omnipaque contrast was injected through the ureteral catheter and a retrograde pyelogram was performed with findings as dictated above.  The bladder was then re-examined after the resectoscope was placed.  The bladder tumor was 1.5 cm.  It was located on the right lateral wall and appeared papillary. Using loop cautery resection, the entire tumor was resected and removed for permanent pathologic analysis.   Separate biopsies were also taken of the underlying deep bladder tissue and sent as a separate specimen.   Hemostasis was then achieved with the loop cautery and the bladder was emptied and reinspected with no further bleeding noted at the end of the procedure.    The bladder was then emptied and the procedure ended.  The patient appeared to tolerate the procedure well and without complications.  The patient was able to be awakened and transferred to the recovery unit in satisfactory condition.    In the PACU, the patient was administered 40 mg of Mitomycin C in 40 cc of sterile water and left indwelling for one hour.   Pryor Curia MD

## 2017-01-12 NOTE — Anesthesia Procedure Notes (Signed)
Procedure Name: Intubation Date/Time: 01/12/2017 4:20 PM Performed by: Noralyn Pick D Pre-anesthesia Checklist: Patient identified, Emergency Drugs available, Suction available and Patient being monitored Patient Re-evaluated:Patient Re-evaluated prior to induction Oxygen Delivery Method: Circle system utilized Preoxygenation: Pre-oxygenation with 100% oxygen Induction Type: IV induction Ventilation: Mask ventilation without difficulty Laryngoscope Size: Miller and 4 Grade View: Grade II Tube type: Oral Tube size: 7.5 mm Number of attempts: 1 Airway Equipment and Method: Stylet Placement Confirmation: ETT inserted through vocal cords under direct vision,  positive ETCO2 and breath sounds checked- equal and bilateral Secured at: 22 cm Tube secured with: Tape Dental Injury: Teeth and Oropharynx as per pre-operative assessment

## 2017-01-12 NOTE — Anesthesia Preprocedure Evaluation (Addendum)
Anesthesia Evaluation  Patient identified by MRN, date of birth, ID band Patient awake    Reviewed: Allergy & Precautions, NPO status , Patient's Chart, lab work & pertinent test results  Airway Mallampati: II  TM Distance: >3 FB Neck ROM: Full    Dental no notable dental hx.    Pulmonary COPD, former smoker,    Pulmonary exam normal breath sounds clear to auscultation       Cardiovascular hypertension, + Valvular Problems/Murmurs  Rhythm:Regular Rate:Normal + Systolic murmurs    Neuro/Psych negative neurological ROS  negative psych ROS   GI/Hepatic negative GI ROS, Neg liver ROS,   Endo/Other  diabetesHypothyroidism Morbid obesity  Renal/GU negative Renal ROS  negative genitourinary   Musculoskeletal negative musculoskeletal ROS (+)   Abdominal   Peds negative pediatric ROS (+)  Hematology negative hematology ROS (+)   Anesthesia Other Findings   Reproductive/Obstetrics negative OB ROS                             Anesthesia Physical Anesthesia Plan  ASA: III  Anesthesia Plan: General   Post-op Pain Management:    Induction: Intravenous  PONV Risk Score and Plan: 2 and Ondansetron and Dexamethasone  Airway Management Planned: Oral ETT and LMA  Additional Equipment:   Intra-op Plan:   Post-operative Plan: Extubation in OR  Informed Consent: I have reviewed the patients History and Physical, chart, labs and discussed the procedure including the risks, benefits and alternatives for the proposed anesthesia with the patient or authorized representative who has indicated his/her understanding and acceptance.   Dental advisory given  Plan Discussed with: CRNA and Surgeon  Anesthesia Plan Comments:        Anesthesia Quick Evaluation

## 2017-01-12 NOTE — Discharge Instructions (Signed)
1. You may see some blood in the urine and may have some burning with urination for 48-72 hours. You also may notice that you have to urinate more frequently or urgently after your procedure which is normal.  °2. You should call should you develop an inability urinate, fever > 101, persistent nausea and vomiting that prevents you from eating or drinking to stay hydrated.  °

## 2017-01-12 NOTE — Progress Notes (Signed)
Patient D/C VS obtained 1937, IV removed @1940 .  Patient denied pain, denies SOB, denies N/V.  Patient breathing even and unlabored NAD at this time.

## 2017-01-12 NOTE — Transfer of Care (Signed)
Immediate Anesthesia Transfer of Care Note  Patient: Shannon Schultz  Procedure(s) Performed: Procedure(s): TRANSURETHRAL RESECTION OF BLADDER TUMOR WITH POSSIBLE POST OPERATIVE INSTILLATION OF MITOMYCIN-C (N/A) CYSTOSCOPY WITH RETROGRADE PYELOGRAM/ EXAM UNDER ANESTHESIA (Bilateral)  Patient Location: PACU  Anesthesia Type:General  Level of Consciousness: awake, alert  and oriented  Airway & Oxygen Therapy: Patient Spontanous Breathing and Patient connected to face mask oxygen  Post-op Assessment: Report given to RN and Post -op Vital signs reviewed and stable  Post vital signs: Reviewed and stable  Last Vitals:  Vitals:   01/12/17 1709 01/12/17 1715  BP: 137/62 (!) 123/58  Pulse: 91 85  Resp:  15  Temp:      Last Pain:  Vitals:   01/12/17 1327  TempSrc:   PainSc: 1       Patients Stated Pain Goal: 3 (38/33/38 3291)  Complications: No apparent anesthesia complications

## 2017-01-12 NOTE — Progress Notes (Signed)
Catheter removed after draining bladder content of 258ml.  Patient ambulatory in room to wheelchair with steady gait.  Patient wheeled to restroom and patient was able to void 154ml without difficulty or pain.  Patient ambulatory back to wheelchair and escorted back to patient room.  Patient able to dress herself and was steady on her feet.  Patient denied pain, denied SOB, denied N/V, Patient breathing even and unlabored, NAD at this time.

## 2017-01-13 ENCOUNTER — Encounter (HOSPITAL_COMMUNITY): Payer: Self-pay | Admitting: Urology

## 2017-01-13 ENCOUNTER — Encounter (HOSPITAL_COMMUNITY): Payer: Medicare HMO

## 2017-01-15 ENCOUNTER — Encounter (HOSPITAL_COMMUNITY): Payer: Medicare HMO

## 2017-01-15 ENCOUNTER — Telehealth: Payer: Self-pay

## 2017-01-15 MED ORDER — BISOPROLOL-HYDROCHLOROTHIAZIDE 10-6.25 MG PO TABS: 1.0000 | ORAL_TABLET | Freq: Every day | ORAL | 0 refills | Status: DC

## 2017-01-15 NOTE — Telephone Encounter (Signed)
rx refilled for 90 days. Shannon Schultz

## 2017-01-15 NOTE — Telephone Encounter (Signed)
Pt requesting 90 day supply of bisoprolol and HCTZ to be sent to [facility]. Last doumentation of this medication is recorded as historical. Please advise. Shannon Schultz

## 2017-01-15 NOTE — Telephone Encounter (Signed)
It is listed as historical because it was automatically discontinued when she was in the hospital.  You can see the last fill and reason for DC when you look under the "history" tab at her last fill. She is current on her visits, and scheduled again with me in September.  Please refill for 90d

## 2017-01-20 ENCOUNTER — Encounter (HOSPITAL_COMMUNITY): Payer: Medicare HMO

## 2017-01-22 ENCOUNTER — Encounter (HOSPITAL_COMMUNITY): Payer: Medicare HMO

## 2017-01-26 ENCOUNTER — Ambulatory Visit (INDEPENDENT_AMBULATORY_CARE_PROVIDER_SITE_OTHER): Payer: Medicare HMO | Admitting: Pulmonary Disease

## 2017-01-26 ENCOUNTER — Encounter: Payer: Self-pay | Admitting: Pulmonary Disease

## 2017-01-26 VITALS — BP 156/88 | HR 88 | Ht 61.5 in | Wt 242.0 lb

## 2017-01-26 DIAGNOSIS — J449 Chronic obstructive pulmonary disease, unspecified: Secondary | ICD-10-CM

## 2017-01-26 MED ORDER — TIOTROPIUM BROMIDE-OLODATEROL 2.5-2.5 MCG/ACT IN AERS: 2.0000 | INHALATION_SPRAY | Freq: Every day | RESPIRATORY_TRACT | 0 refills | Status: AC

## 2017-01-26 NOTE — Progress Notes (Signed)
Shannon Schultz    [id number]    Jul 18, 1945  Primary Care Physician:Knapp, Tera Helper, MD  Referring Physician: [name], [address] [place], [place] [address]  Chief complaint:  Follow up for COPD GOLD B  HPI: Shannon Schultz is a 71 year old with COPD, moderate obstructive defect. She was previously seen by Dr. Melvyn Novas in 2013. She has been maintained on Symbicort since 2013. She was hospitalized in February 2017 for the flu and COPD exacerbation.  She is currently in pulmonary rehabilitation. She reports worsening dyspnea on exertion for the past year. She's noticed this more over the past few months. She denies any dyspnea at rest, cough, sputum production, wheezing.  Interim History: At last visit Symbicort was held and she was started on trelegy inhaler. She feels this has helped a lot with her symptoms however she has occasional hoarseness of voice. She is rinsing her mouth after every use She continues on pulmonary rehabilitation and is on 2 L supplemental oxygen with ambulation.   Outpatient Encounter Prescriptions as of 01/26/2017  Medication Sig  . acetaminophen (TYLENOL) 500 MG tablet Take 1,000 mg by mouth every 6 (six) hours as needed (for pain/headaches.).  Marland Kitchen albuterol (PROVENTIL) (2.5 MG/3ML) 0.083% nebulizer solution USE 1 VIAL BY NEBULIZATION EVERY FOUR HOURS AS NEEDED FOR WHEEZING OR SHORTNESS OF BREATH.  . bisoprolol-hydrochlorothiazide (ZIAC) 10-6.25 MG tablet Take 1 tablet by mouth daily.  . Calcium Carbonate-Vitamin D (CALCIUM 600+D) 600-400 MG-UNIT per tablet Take 1 tablet by mouth daily.   . cetirizine (KLS ALLER-TEC) 10 MG tablet Take 10 mg by mouth daily.  . Cholecalciferol (VITAMIN D) 2000 UNITS tablet Take 4,000 Units by mouth daily.  . fluticasone (FLONASE) 50 MCG/ACT nasal spray Place 2 sprays into both nostrils daily. (Patient taking differently: Place 2 sprays into both nostrils every evening. )  . Fluticasone-Umeclidin-Vilant (TRELEGY ELLIPTA)  100-62.5-25 MCG/INH AEPB Inhale 1 puff into the lungs daily.  . furosemide (LASIX) 20 MG tablet Take 20 mg by mouth daily as needed (for fluid retention/edema).   . metFORMIN (GLUCOPHAGE-XR) 500 MG 24 hr tablet TAKE 1 TABLET THREE TIMES DAILY (Patient taking differently: TAKE 1 TABLET THREE TIMES DAILY IN THE MORNING, AT LUNCH, & AT BEDTIME.)  . Multiple Vitamins-Minerals (CENTRUM SILVER PO) Take 1 tablet by mouth daily.   . phenazopyridine (PYRIDIUM) 200 MG tablet Take 1 tablet (200 mg total) by mouth 3 (three) times daily as needed for pain.  . simvastatin (ZOCOR) 20 MG tablet Take 20 mg by mouth every evening.  . triamcinolone cream (KENALOG) 0.1 % Apply 1 application topically 2 (two) times daily. Applied to affected areas on legs.  . TRUEPLUS LANCETS 33G MISC TEST ONE TIME DAILY (Patient taking differently: TEST ONCE TO TWICE DAILY)  . valACYclovir (VALTREX) 500 MG tablet Take 500 mg by mouth daily.  . [DISCONTINUED] albuterol (PROVENTIL HFA;VENTOLIN HFA) 108 (90 Base) MCG/ACT inhaler Inhale 2 puffs into the lungs every 6 (six) hours as needed for wheezing or shortness of breath.  . [DISCONTINUED] traMADol (ULTRAM) 50 MG tablet Take 1 tablet (50 mg total) by mouth every 6 (six) hours as needed.   No facility-administered encounter medications on file as of 01/26/2017.     Allergies as of 01/26/2017 - Review Complete 01/26/2017  Allergen Reaction Noted  . Contrast media [iodinated diagnostic agents] Anaphylaxis and Shortness Of Breath 06/06/2011  . Iohexol Anaphylaxis and Shortness Of Breath 06/25/2009  . Lisinopril Anaphylaxis, Shortness Of Breath, and Rash  05/15/2014  . Sulfa antibiotics Anaphylaxis and Other (See Comments) 11/11/2010  . Latex Other (See Comments) 06/25/2011  . Codeine Nausea Only, Anxiety, and Other (See Comments) 11/11/2010  . Levofloxacin Other (See Comments) 11/11/2010  . Lipitor [atorvastatin calcium] Rash 11/11/2010    Past Medical History:  Diagnosis Date  .  Allergy   . Aortic stenosis    mild-mod by 04/08/16 echo  . Arthritis   . Cancer ([facility]) 1992   R breast, DCIS,bladder ca (just dx)  . Colon polyp   . Complication of anesthesia    92    "local anesthesia" used was hard to awaken from-no problems since  . COPD (chronic obstructive pulmonary disease) ([place])   . Diverticulosis   . Dyspnea   . Elevated cholesterol   . Elevated liver enzymes    fatty liver per ultrasound per pt  . Family history of malignant neoplasm of breast   . FHx: BRCA2 gene positive    sister with BRCA2 mutation (pt tested NEGATIVE)  . Genital HSV    gets on hip  . Heart murmur     echo 05/2011 mild aortic stenosis  . HSV (herpes simplex virus) infection    on hip--on daily suppression  . Hypertension   . Hypothyroidism    took med 7 yrs after birth of 1st child  . Impaired glucose tolerance   . Migraine   . Osteopenia   . Pneumonia 06/06/2011  . Type 2 diabetes mellitus (HCC)    type 2  . Vitamin D deficiency disease     Past Surgical History:  Procedure Laterality Date  . bladder tumor rescestion      Dr. Alinda Money 01/12/17  . BREAST LUMPECTOMY WITH RADIOACTIVE SEED LOCALIZATION Left 12/22/2016   Procedure: LEFT BREAST LUMPECTOMY WITH RADIOACTIVE SEED LOCALIZATION;  Surgeon: Jovita Kussmaul, MD;  Location: [facility];  Service: General;  Laterality: Left;  . CHOLECYSTECTOMY  2003  . COLONOSCOPY  2009, 08/2010, 12/2015   Dr. Collene Mares  . CYSTOSCOPY W/ RETROGRADES Bilateral 01/12/2017   Procedure: CYSTOSCOPY WITH RETROGRADE PYELOGRAM/ EXAM UNDER ANESTHESIA;  Surgeon: Raynelle Bring, MD;  Location: [facility];  Service: Urology;  Laterality: Bilateral;  . HERNIA REPAIR  [id number]   umbilical  . MASTECTOMY Right 1992   R breast for cancer  . TRANSURETHRAL RESECTION OF BLADDER TUMOR WITH MITOMYCIN-C N/A 01/12/2017   Procedure: TRANSURETHRAL RESECTION OF BLADDER TUMOR WITH POSSIBLE POST OPERATIVE INSTILLATION OF MITOMYCIN-C;  Surgeon: Raynelle Bring, MD;  Location: [facility];  Service:  Urology;  Laterality: N/A;    Family History  Problem Relation Age of Onset  . Heart disease Father   . Diabetes Father   . Hypertension Father   . Asthma Sister   . Allergies Sister   . Hyperlipidemia Sister   . Hashimoto's thyroiditis Sister   . Heart disease Mother        tachycardia  . Asthma Sister   . Allergies Sister   . Hyperlipidemia Sister   . Hashimoto's thyroiditis Sister   . Breast cancer Sister 51       BRCA2 positive; metastatic to bones at age 56  . Cancer Other        female cancers, bone cancer  . Cancer Maternal Grandmother        deceased 61; unk. primary; possibly stomach  . Tuberculosis Maternal Grandfather   . Stroke Paternal Grandmother 28       died of cerebral hemorrhage  . Breast cancer Other  Social History   Social History  . Marital status: Married    Spouse name: N/A  . Number of children: 3  . Years of education: N/A   Occupational History  . Retired    Social History Main Topics  . Smoking status: Former Smoker    Packs/day: 1.00    Years: 46.00    Types: Cigarettes    Quit date: 05/31/2011  . Smokeless tobacco: Never Used  . Alcohol use No     Comment: rarely, 1 drink per year maybe  . Drug use: No  . Sexual activity: Yes    Partners: Male    Birth control/ protection: Post-menopausal   Other Topics Concern  . Not on file   Social History Narrative   Lives with her husband.  Children all live in [place] nearby. No pets. 5 grandchildren.   Sister moving in with her 08/2016 (after hip fracture)    Review of systems: Review of Systems  Constitutional: Negative for fever and chills.  HENT: Negative.   Eyes: Negative for blurred vision.  Respiratory: as per HPI  Cardiovascular: Negative for chest pain and palpitations.  Gastrointestinal: Negative for vomiting, diarrhea, blood per rectum. Genitourinary: Negative for dysuria, urgency, frequency and hematuria.  Musculoskeletal: Negative for myalgias, back pain and joint  pain.  Skin: Negative for itching and rash.  Neurological: Negative for dizziness, tremors, focal weakness, seizures and loss of consciousness.  Endo/Heme/Allergies: Negative for environmental allergies.  Psychiatric/Behavioral: Negative for depression, suicidal ideas and hallucinations.  All other systems reviewed and are negative.  Physical Exam: Blood pressure 128/70, pulse 94, height _0  (1.626 m), weight 239 lb (108.4 kg), SpO2 93 %. Gen:      No acute distress HEENT:  EOMI, sclera anicteric Neck:     No masses; no thyromegaly Lungs:    Clear to auscultation bilaterally; normal respiratory effort CV:         Regular rate and rhythm; no murmurs Abd:      + bowel sounds; soft, non-tender; no palpable masses, no distension Ext:    No edema; adequate peripheral perfusion Skin:      Warm and dry; no rash Neuro: alert and oriented x 3 Psych: normal mood and affect  Data Reviewed: Chest x-ray 08/16/15-hyperinflation consistent with COPD. No acute pulmonary abnormality. Chest x-ray 9//16/16-no acute cardio pulmonary abnormality Screening CT chest 09/10/16 -moderate emphysema with no suspicious lung nodule or mass. All images personally reviewed  PFTs 08/28/11 FVC 2.71 [92%] FEV1 1.33 (62%) F/F 49 TLC 114% DLCO 46% Moderate obstructive defect with moderate reduction in diffusion capacity. No bronchodilator response  11/08/15 FVC 2.05 [7%) FEV1 1.23 (55%) F/F 60 Moderate obstructive defect  09/24/16 FVC 2. (82%) FEV1 1.43 (62%) F/F 58  TLC 99% DLCO 54% Moderate obstruction with moderate diffusion defect. No bronchodilator response.  A1AT 08/08/16- 162, PIMM  Assessment:  COPD GOLD B She has responded well to the [employer] inhaler but has hoarseness even though she rinses her mouth after each use. As this has continued to be an issue we'll switch her to [employer]. I don't believe she'll need inhaled corticosteroids as she does not have a broncho dilator response on PFTs  and eosinophil counts are low in CBC..  A1AT levels are normal and screening CT is ok.  She will continue on pulmonary rehab and continue supplemental O2 with ambulation.  Plan/Recommendations: - Give samples of stiolto - Pulmonary rehab, supplemental o2   Marshell Garfinkel MD [facility] Pager [phone] 01/26/2017, 3:08 PM  CC: [name], MD

## 2017-01-26 NOTE — Patient Instructions (Addendum)
We will stop the trelegy and give you samples of stiolto inhaler inhaler. Please let us know if this works for you and we will call in the prescription  Return in 6 months

## 2017-01-26 NOTE — Addendum Note (Signed)
Addended by: Maryanna Shape A on: 01/26/2017 03:38 PM   Modules accepted: Orders

## 2017-01-27 ENCOUNTER — Encounter (HOSPITAL_COMMUNITY): Payer: Medicare HMO

## 2017-01-27 DIAGNOSIS — C678 Malignant neoplasm of overlapping sites of bladder: Secondary | ICD-10-CM | POA: Diagnosis not present

## 2017-01-29 ENCOUNTER — Encounter (HOSPITAL_COMMUNITY): Payer: Medicare HMO

## 2017-02-02 ENCOUNTER — Ambulatory Visit (INDEPENDENT_AMBULATORY_CARE_PROVIDER_SITE_OTHER): Payer: Medicare HMO | Admitting: Family Medicine

## 2017-02-02 ENCOUNTER — Encounter: Payer: Self-pay | Admitting: Family Medicine

## 2017-02-02 VITALS — BP 130/70 | HR 86 | Resp 16 | Wt 244.0 lb

## 2017-02-02 DIAGNOSIS — M7061 Trochanteric bursitis, right hip: Secondary | ICD-10-CM

## 2017-02-02 DIAGNOSIS — J441 Chronic obstructive pulmonary disease with (acute) exacerbation: Secondary | ICD-10-CM | POA: Diagnosis not present

## 2017-02-02 DIAGNOSIS — J449 Chronic obstructive pulmonary disease, unspecified: Secondary | ICD-10-CM | POA: Diagnosis not present

## 2017-02-02 DIAGNOSIS — M7062 Trochanteric bursitis, left hip: Secondary | ICD-10-CM

## 2017-02-02 MED ORDER — NAPROXEN 500 MG PO TABS: 500.0000 mg | ORAL_TABLET | Freq: Two times a day (BID) | ORAL | 0 refills | Status: DC

## 2017-02-02 NOTE — Patient Instructions (Signed)
Hip Bursitis Hip bursitis is inflammation of a fluid-filled sac (bursa) in the hip joint. The bursa protects the bones in the hip joint from rubbing against each other. Hip bursitis can cause mild to moderate pain, and symptoms often come and go over time. What are the causes? This condition may be caused by:  Injury to the hip.  Overuse of the muscles that surround the hip joint.  Arthritis or gout.  Diabetes.  Thyroid disease.  Cold weather.  Infection.  In some cases, the cause may not be known. What are the signs or symptoms? Symptoms of this condition may include:  Mild or moderate pain in the hip area. Pain may get worse with movement.  Tenderness and swelling of the hip, especially on the outer side of the hip.  Symptoms may come and go. If the bursa becomes infected, you may have the following symptoms:  Fever.  Red skin and a feeling of warmth in the hip area.  How is this diagnosed? This condition may be diagnosed based on:  A physical exam.  Your medical history.  X-rays.  Removal of fluid from your inflamed bursa for testing (biopsy).  You may be sent to a health care provider who specializes in bone diseases (orthopedist) or a provider who specializes in joint inflammation (rheumatologist). How is this treated? This condition is treated by resting, raising (elevating), and applying pressure(compression) to the injured area. In some cases, this may be enough to make your symptoms go away. Treatment may also include:  Crutches.  Antibiotic medicine.  Draining fluid out of the bursa to help relieve swelling.  Injecting medicine that helps to reduce inflammation (cortisone).  Follow these instructions at home: Medicines  Take over-the-counter and prescription medicines only as told by your health care provider.  Do not drive or operate heavy machinery while taking prescription pain medicine, or as told by your health care provider.  If you were  prescribed an antibiotic, take it as told by your health care provider. Do not stop taking the antibiotic even if you start to feel better. Activity  Return to your normal activities as told by your health care provider. Ask your health care provider what activities are safe for you.  Rest and protect your hip as much as possible until your pain and swelling get better. General instructions  Wear compression wraps only as told by your health care provider.  Elevate your hip above the level of your heart as much as you can without pain. To do this, try putting a pillow under your hips while you lie down.  Do not use your hip to support your body weight until your health care provider says that you can. Use crutches as told by your health care provider.  Gently massage and stretch your injured area as often as is comfortable.  Keep all follow-up visits as told by your health care provider. This is important. How is this prevented?  Exercise regularly, as told by your health care provider.  Warm up and stretch before being active.  Cool down and stretch after being active.  If an activity irritates your hip or causes pain, avoid the activity as much as possible.  Avoid sitting down for long periods at a time. Contact a health care provider if:  You have a fever.  You develop new symptoms.  You have difficulty walking or doing everyday activities.  You have pain that gets worse or does not get better with medicine.  You  develop red skin or a feeling of warmth in your hip area. Get help right away if:  You cannot move your hip.  You have severe pain. This information is not intended to replace advice given to you by your health care provider. Make sure you discuss any questions you have with your health care provider. Document Released: 12/06/2001 Document Revised: 11/22/2015 Document Reviewed: 01/16/2015 Elsevier Interactive Patient Education  2018 [employer]  your prescribed anti-inflammatory medication with food; discontinue or cut back the dose if you develop stomach pain/discomfort/side effects.  Do not take other over-the-counter pain medications such as ibuprofen, advil, motrin, aleve, naproxen, BC or Goody Powder at the same time.  Do not use longer than recommended.  It is okay to use acetaminophen (tylenol) along with this medication. Take it twice daily until you feel like your pain has completely resolved.  If you need to use the whole bottle, that's okay.  If you are better after 7-10 days, you don't have to finish the whole bottle.

## 2017-02-02 NOTE — Progress Notes (Signed)
Chief Complaint  Patient presents with  . Hip Pain    bilateral hip pain. having issues sleeping- will sleep for an hour and wake up in pain.    She has tenderness on the outside of both hips, and it is painful to lay on her sides at night.  She has been sleeping in a recliner for the last several months.  The pain began probably in January.  Not very noticeable during the day, mostly bothersome at night.  She has had a lot going on, and hasn't had a chance to get this checked out (especially because she doesn't really feel it during the day)--breast surgery, incidental finding of bladder cancer when surgeons did CT to evaluate her hernia. This was just treated, and no additional treatments are needed.  She became tearful when discussing what has been on her plate the last few months--her health issues, but also related to her sister, who died in October 06, 2022 from complications of metastatic breast cancer.  PMH, PSH, SH reviewed  Outpatient Encounter Prescriptions as of 02/02/2017  Medication Sig Note  . acetaminophen (TYLENOL) 500 MG tablet Take 1,000 mg by mouth every 6 (six) hours as needed (for pain/headaches.).   Marland Kitchen albuterol (PROVENTIL) (2.5 MG/3ML) 0.083% nebulizer solution USE 1 VIAL BY NEBULIZATION EVERY FOUR HOURS AS NEEDED FOR WHEEZING OR SHORTNESS OF BREATH.   . bisoprolol-hydrochlorothiazide (ZIAC) 10-6.25 MG tablet Take 1 tablet by mouth daily.   . Calcium Carbonate-Vitamin D (CALCIUM 600+D) 600-400 MG-UNIT per tablet Take 1 tablet by mouth daily.  01/06/2017: ON HOLD DUE TO PROCEDURES  . cetirizine (KLS ALLER-TEC) 10 MG tablet Take 10 mg by mouth daily.   . Cholecalciferol (VITAMIN D) 2000 UNITS tablet Take 4,000 Units by mouth daily. 01/06/2017: ON HOLD DUE TO PROCEDURES   . fluticasone (FLONASE) 50 MCG/ACT nasal spray Place 2 sprays into both nostrils daily. (Patient taking differently: Place 2 sprays into both nostrils every evening. )   . Fluticasone-Umeclidin-Vilant (TRELEGY ELLIPTA)  100-62.5-25 MCG/INH AEPB Inhale 1 puff into the lungs daily.   . furosemide (LASIX) 20 MG tablet Take 20 mg by mouth daily as needed (for fluid retention/edema).    . metFORMIN (GLUCOPHAGE-XR) 500 MG 24 hr tablet TAKE 1 TABLET THREE TIMES DAILY (Patient taking differently: TAKE 1 TABLET THREE TIMES DAILY IN THE MORNING, AT LUNCH, & AT BEDTIME.)   . Multiple Vitamins-Minerals (CENTRUM SILVER PO) Take 1 tablet by mouth daily.  01/06/2017: ON HOLD DUE TO PROCEDURES   . phenazopyridine (PYRIDIUM) 200 MG tablet Take 1 tablet (200 mg total) by mouth 3 (three) times daily as needed for pain.   . simvastatin (ZOCOR) 20 MG tablet Take 20 mg by mouth every evening.   . triamcinolone cream (KENALOG) 0.1 % Apply 1 application topically 2 (two) times daily. Applied to affected areas on legs.   . TRUEPLUS LANCETS 33G MISC TEST ONE TIME DAILY (Patient taking differently: TEST ONCE TO TWICE DAILY)   . valACYclovir (VALTREX) 500 MG tablet Take 500 mg by mouth daily.   . naproxen (NAPROSYN) 500 MG tablet Take 1 tablet (500 mg total) by mouth 2 (two) times daily with a meal.    No facility-administered encounter medications on file as of 02/02/2017.    Allergies  Allergen Reactions  . Contrast Media [Iodinated Diagnostic Agents] Anaphylaxis and Shortness Of Breath    Could not breath  . Iohexol Anaphylaxis and Shortness Of Breath    Immediately could not breathe  . Lisinopril Anaphylaxis, Shortness Of Breath  and Rash  . Sulfa Antibiotics Anaphylaxis and Other (See Comments)    Historical from mother, pt states that mother says she almost died from this drug  . Latex Other (See Comments)    blisters  . Codeine Nausea Only, Anxiety and Other (See Comments)    insomnia  . Levofloxacin Other (See Comments)    insomnia  . Lipitor [Atorvastatin Calcium] Rash    ROS: no fever, chills, URI symptoms; breathing is stable. No GI or GU complaints. Moods overall are okay, feels sad when thinking about her sister. No  headaches, dizziness, chest pain, skin rashes, or other concerns  PHYSICAL EXAM:  BP 130/70   Pulse 86   Resp 16   Wt 244 lb (110.7 kg)   LMP  (LMP Unknown)   SpO2 96%   BMI 45.36 kg/m   Well appearing, obese female, who became tearful when talking about her sister.  Full range of affect during visit HEENT conjunctiva and sclera are clear Neck: no lymphadenopathy or mass Heart: murmur unchanged, regular rate and rhythm Lungs; clear bilaterally Back: no CVA or spinal tenderness Extremities: Tender bilaterally at trochanteric bursa FROM of hips without pain Neuro: alert and oriented, cranial nerves intact, normal gait  ASSESSMENT/PLAN:  Trochanteric bursitis of both hips - counseled re: risks/side effects of NSAIDs; discussed cortisone injections if persistent pain - Plan: naproxen (NAPROSYN) 500 MG tablet  Encouraged grief counseling --she has been talking to her minister, which has been helpful.  Let her know about grief counseling available through Hosp[facility]  F/u prn, or as previously scheduled (September)

## 2017-02-03 ENCOUNTER — Encounter: Payer: Self-pay | Admitting: Family Medicine

## 2017-02-03 ENCOUNTER — Encounter (HOSPITAL_COMMUNITY): Payer: Medicare HMO

## 2017-02-06 ENCOUNTER — Encounter: Payer: Self-pay | Admitting: Hematology and Oncology

## 2017-02-10 ENCOUNTER — Encounter (HOSPITAL_COMMUNITY): Payer: Self-pay

## 2017-02-10 NOTE — Progress Notes (Signed)
Discharge Summary  Patient Details  Name: Shannon Schultz MRN: [id number] Date of Birth: 1945/12/28 Referring Provider:     Pulmonary Rehab Walk Test from 10/07/2016 in [place]  Referring Provider  Dr. Tomi Bamberger       Number of Visits: 21  Reason for Discharge:  Patient reached a stable level of exercise. Patient independent in their exercise.  Smoking History:  History  Smoking Status  . Former Smoker  . Packs/day: 1.00  . Years: 46.00  . Types: Cigarettes  . Quit date: 05/31/2011  Smokeless Tobacco  . Never Used    Diagnosis:  No diagnosis found.  ADL UCSD:     Pulmonary Assessment Scores    Row Name 01/01/17 1252 01/01/17 1344       ADL UCSD   ADL Phase  - Exit    SOB Score total  - 41      CAT Score   CAT Score  - 11  Exit      mMRC Score   mMRC Score PRE:1 POST:1  -       Initial Exercise Prescription:     Initial Exercise Prescription - 10/07/16 1600      Date of Initial Exercise RX and Referring Provider   Date 10/07/16   Referring Provider Dr. Tomi Bamberger     Oxygen   Oxygen Continuous   Liters 2     Bike   Level 2   Minutes 17     NuStep   Level 3   Minutes 17   METs 1.5     Track   Laps 10   Minutes 17     Prescription Details   Frequency (times per week) 2   Duration Progress to 45 minutes of aerobic exercise without signs/symptoms of physical distress     Intensity   THRR 40-80% of Max Heartrate 60-120   Ratings of Perceived Exertion 11-13   Perceived Dyspnea 0-4     Progression   Progression Continue progressive overload as per policy without signs/symptoms or physical distress.     Resistance Training   Training Prescription Yes   Weight orange bands   Reps 10-15      Discharge Exercise Prescription (Final Exercise Prescription Changes):     Exercise Prescription Changes - 12/30/16 1235      Response to Exercise   Blood Pressure (Admit) 108/60   Blood Pressure (Exercise) 164/72    Blood Pressure (Exit) 124/70   Heart Rate (Admit) 83 bpm   Heart Rate (Exercise) 118 bpm   Heart Rate (Exit) 85 bpm   Oxygen Saturation (Admit) 93 %   Oxygen Saturation (Exercise) 95 %   Oxygen Saturation (Exit) 94 %   Rating of Perceived Exertion (Exercise) 13   Perceived Dyspnea (Exercise) 2   Duration Progress to 45 minutes of aerobic exercise without signs/symptoms of physical distress   Intensity THRR unchanged     Progression   Progression Continue to progress workloads to maintain intensity without signs/symptoms of physical distress.     Resistance Training   Training Prescription Yes   Weight orange bands   Reps 10-15   Time 10 Minutes     Interval Training   Interval Training No     Oxygen   Oxygen Continuous   Liters 2     Bike   Level 4.5   Minutes 17     NuStep   Level 6   Minutes 17   METs 2.2  Track   Laps 9   Minutes 17      Functional Capacity:     6 Minute Walk    Row Name 10/07/16 1616 01/01/17 1249       6 Minute Walk   Phase Initial Discharge    Distance 1155 feet 1328 feet    Walk Time 6 minutes 6 minutes    # of Rest Breaks 0 0    MPH 2.18 2.5    METS 2.61 2.91    RPE 13 15    Perceived Dyspnea  1 3    Symptoms No Yes (comment)    Comments  - wheelchair    Resting HR 68 bpm 76 bpm    Resting BP 120/56 124/66    Max Ex. HR 108 bpm 120 bpm    Max Ex. BP 140/90 160/84    2 Minute Post BP 138/82 152/78      Interval HR   Baseline HR 68 76    1 Minute HR 91 90    2 Minute HR 91 90    3 Minute HR 104 109    4 Minute HR 104 114    5 Minute HR 105 120    6 Minute HR 108 120    2 Minute Post HR 85 92    Interval Heart Rate? Yes Yes      Interval Oxygen   Interval Oxygen? Yes Yes    Baseline Oxygen Saturation % 98 % 95 %    Baseline Liters of Oxygen 2 L 2 L    1 Minute Oxygen Saturation % 92 % 90 %    1 Minute Liters of Oxygen 2 L 2 L    2 Minute Oxygen Saturation % 93 % 90 %    2 Minute Liters of Oxygen 2 L 2 L     3 Minute Oxygen Saturation % 88 % 90 %    3 Minute Liters of Oxygen 2 L 2 L    4 Minute Oxygen Saturation % 88 % 91 %    4 Minute Liters of Oxygen 2 L 2 L    5 Minute Oxygen Saturation % 89 % 88 %    5 Minute Liters of Oxygen 2 L 2 L    6 Minute Oxygen Saturation % 89 % 87 %    6 Minute Liters of Oxygen 2 L 2 L    2 Minute Post Oxygen Saturation % 97 % 93 %    2 Minute Post Liters of Oxygen 2 L 2 L       Psychological, QOL, Others - Outcomes: PHQ 2/9: Depression screen [facility] 2/9 01/01/2017 09/29/2016 07/21/2016 02/14/2016 01/10/2015  Decreased Interest 0 0 0 0 0  Down, Depressed, Hopeless 0 0 0 0 0  PHQ - 2 Score 0 0 0 0 0    Quality of Life:   Personal Goals: Goals established at orientation with interventions provided to work toward goal.     Personal Goals and Risk Factors at Admission - 12/30/16 1307      Core Components/Risk Factors/Patient Goals on Admission   Intervention Obesity: Provide education and appropriate resources to help participant work on and attain dietary goals.;Weight Management/Obesity: Establish reasonable short term and long term weight goals.;Weight Management: Provide education and appropriate resources to help participant work on and attain dietary goals.   Expected Outcomes Short Term: Continue to assess and modify interventions until short term weight is achieved  Personal Goals Discharge:     Goals and Risk Factor Review    Row Name 11/03/16 1634 12/30/16 1308           Core Components/Risk Factors/Patient Goals Review   Personal Goals Review Weight Management/Obesity;Develop more efficient breathing techniques such as purse lipped breathing and diaphragmatic breathing and practicing self-pacing with activity.;Improve shortness of breath with ADL's Weight Management/Obesity;Develop more efficient breathing techniques such as purse lipped breathing and diaphragmatic breathing and practicing self-pacing with activity.;Improve shortness of  breath with ADL's      Review see comment section on ITP pt has not had success with weight loss. She relates this to her unwillingness to decrease her caloric intake. She has recently had a lumpectomy, she will be having bladder surgery in 2 weeks for bladder CA, and she also needs a hernia repaired. She feels she is able to be more physically active with less shortness of breath and practices pacing       Expected Outcomes see Admission expected outcomes see Admission expected outcomes         Nutrition & Weight - Outcomes:     Pre Biometrics - 09/29/16 0958      Pre Biometrics   Grip Strength 25 kg         Post Biometrics - 01/01/17 1251       Post  Biometrics   Grip Strength 34 kg      Nutrition:     Nutrition Therapy & Goals - 10/16/16 1040      Nutrition Therapy   Diet Carb Modified, Therapeutic Lifestyle Changes     Personal Nutrition Goals   Nutrition Goal Wt loss of 1-2 lb/week to a wt loss goal of 6-24 lb at graduation from rehab.      Intervention Plan   Intervention Prescribe, educate and counsel regarding individualized specific dietary modifications aiming towards targeted core components such as weight, hypertension, lipid management, diabetes, heart failure and other comorbidities.   Expected Outcomes Short Term Goal: Understand basic principles of dietary content, such as calories, fat, sodium, cholesterol and nutrients.;Long Term Goal: Adherence to prescribed nutrition plan.      Nutrition Discharge:     Nutrition Assessments - 01/15/17 0725      Rate Your Plate Scores   Pre Score 59   Post Score 55      Education Questionnaire Score:     Knowledge Questionnaire Score - 01/01/17 1344      Knowledge Questionnaire Score   Post Score 13/13      Goals reviewed with patient; copy given to patient.

## 2017-02-16 ENCOUNTER — Ambulatory Visit (HOSPITAL_BASED_OUTPATIENT_CLINIC_OR_DEPARTMENT_OTHER): Payer: Medicare HMO | Admitting: Hematology and Oncology

## 2017-02-16 ENCOUNTER — Encounter: Payer: Self-pay | Admitting: Hematology and Oncology

## 2017-02-16 DIAGNOSIS — Z86000 Personal history of in-situ neoplasm of breast: Secondary | ICD-10-CM

## 2017-02-16 DIAGNOSIS — Z808 Family history of malignant neoplasm of other organs or systems: Secondary | ICD-10-CM | POA: Diagnosis not present

## 2017-02-16 DIAGNOSIS — Z803 Family history of malignant neoplasm of breast: Secondary | ICD-10-CM

## 2017-02-16 DIAGNOSIS — Z809 Family history of malignant neoplasm, unspecified: Secondary | ICD-10-CM | POA: Diagnosis not present

## 2017-02-16 DIAGNOSIS — N329 Bladder disorder, unspecified: Secondary | ICD-10-CM | POA: Diagnosis not present

## 2017-02-16 DIAGNOSIS — N3289 Other specified disorders of bladder: Secondary | ICD-10-CM

## 2017-02-16 DIAGNOSIS — Z87891 Personal history of nicotine dependence: Secondary | ICD-10-CM | POA: Diagnosis not present

## 2017-02-16 DIAGNOSIS — N6092 Unspecified benign mammary dysplasia of left breast: Secondary | ICD-10-CM | POA: Diagnosis not present

## 2017-02-16 NOTE — Assessment & Plan Note (Signed)
Left breast atypical ductal hyperplasia and atypical lobular hyperplasia status post left lumpectomy 12/22/2016  Pathology review: I discussed the difference between atypical ductal hyperplasia, DCIS and invasive breast cancer. I explained to her that atypical ductal hyperplasia is characterized by a proliferation of uniform epithelial cells filling part, but not the entirety, of the involved duct. ADH is associated with an increased risk of both ipsilateral and contralateral breast cancer and thus provides evidence of underlying breast abnormalities that predispose to breast cancer.   Prognosis:Using the American Cancer Society breast cancer risk assessment tool, her risk of breast cancer in 5 years is at 3.75% ( average woman's risk is 2.2%), her lifetime risk of breast cancers at 20.4% ( average risk is a 6%) I adjusted her risk based upon unilateral mastectomy.  I discussed the risks and benefits of tamoxifen therapy. I believe that the risks far outweigh any benefits from tamoxifen. Instead I recommended the following 1. Exercise 30 minutes daily 2. Weight loss 3. Increasing fruits and vegetables and less red meat  Surveillance plan: Annual mammograms and breast exams and follow-up with her primary care physician. Patient will be seen on an as-needed basis.

## 2017-02-16 NOTE — Progress Notes (Signed)
[place] NOTE  Patient Care Team: [name], MD as PCP - General (Family Medicine) [name], MD as Referring Physician (Family Medicine)  CHIEF COMPLAINTS/PURPOSE OF CONSULTATION:  High risk breast clinic evaluation for ADH and ALH  HISTORY OF PRESENTING ILLNESS:  Shannon Schultz 71 y.o. female is here because of recent diagnosis of left breast ADH. Patient had a right mastectomy for DCIS in 1992. She did not require any adjuvant therapy. Her sister was diagnosed with BRCA mutation breast cancer and she died recently. She was diagnosed with atypical ductal hyperplasia after having a mammogram that revealed some abnormalities. She underwent left lumpectomy and final pathology revealed atypical ductal hyperplasia and atypical lobular hyperplasia.  I reviewed her records extensively and collaborated the history with the patient.  MEDICAL HISTORY:  Past Medical History:  Diagnosis Date  . Allergy   . Aortic stenosis    mild-mod by 04/08/16 echo  . Arthritis   . Cancer ([place]) 1992   R breast, DCIS,bladder ca (just dx)  . Colon polyp   . Complication of anesthesia    92    "local anesthesia" used was hard to awaken from-no problems since  . COPD (chronic obstructive pulmonary disease) ([place])   . Diverticulosis   . Dyspnea   . Elevated cholesterol   . Elevated liver enzymes    fatty liver per ultrasound per pt  . Family history of malignant neoplasm of breast   . FHx: BRCA2 gene positive    sister with BRCA2 mutation (pt tested NEGATIVE)  . Genital HSV    gets on hip  . Heart murmur     echo 05/2011 mild aortic stenosis  . HSV (herpes simplex virus) infection    on hip--on daily suppression  . Hypertension   . Hypothyroidism    took med 7 yrs after birth of 1st child  . Impaired glucose tolerance   . Migraine   . Osteopenia   . Pneumonia 06/06/2011  . Type 2 diabetes mellitus (HCC)    type 2  . Vitamin D deficiency disease     SURGICAL  HISTORY: Past Surgical History:  Procedure Laterality Date  . bladder tumor rescestion      Dr. Alinda Money 01/12/17  . BREAST LUMPECTOMY WITH RADIOACTIVE SEED LOCALIZATION Left 12/22/2016   Procedure: LEFT BREAST LUMPECTOMY WITH RADIOACTIVE SEED LOCALIZATION;  Surgeon: Jovita Kussmaul, MD;  Location: [place];  Service: General;  Laterality: Left;  . CHOLECYSTECTOMY  2003  . COLONOSCOPY  2009, 08/2010, 12/2015   Dr. Collene Mares  . CYSTOSCOPY W/ RETROGRADES Bilateral 01/12/2017   Procedure: CYSTOSCOPY WITH RETROGRADE PYELOGRAM/ EXAM UNDER ANESTHESIA;  Surgeon: Raynelle Bring, MD;  Location: [facility];  Service: Urology;  Laterality: Bilateral;  . HERNIA REPAIR  2505   umbilical  . MASTECTOMY Right 1992   R breast for cancer  . TRANSURETHRAL RESECTION OF BLADDER TUMOR WITH MITOMYCIN-C N/A 01/12/2017   Procedure: TRANSURETHRAL RESECTION OF BLADDER TUMOR WITH POSSIBLE POST OPERATIVE INSTILLATION OF MITOMYCIN-C;  Surgeon: Raynelle Bring, MD;  Location: [facility];  Service: Urology;  Laterality: N/A;    SOCIAL HISTORY: Social History   Social History  . Marital status: Married    Spouse name: N/A  . Number of children: 3  . Years of education: N/A   Occupational History  . Retired    Social History Main Topics  . Smoking status: Former Smoker    Packs/day: 1.00    Years: 46.00    Types: Cigarettes  Quit date: 05/31/2011  . Smokeless tobacco: Never Used  . Alcohol use No     Comment: rarely, 1 drink per year maybe  . Drug use: No  . Sexual activity: Yes    Partners: Male    Birth control/ protection: Post-menopausal   Other Topics Concern  . Not on file   Social History Narrative   Lives with her husband.  Children all live in [place] nearby. No pets. 5 grandchildren.   Sister moving in with her 08/2016 (after hip fracture)    FAMILY HISTORY: Family History  Problem Relation Age of Onset  . Heart disease Father   . Diabetes Father   . Hypertension Father   . Asthma Sister   . Allergies Sister    . Hyperlipidemia Sister   . Hashimoto's thyroiditis Sister   . Heart disease Mother        tachycardia  . Asthma Sister   . Allergies Sister   . Hyperlipidemia Sister   . Hashimoto's thyroiditis Sister   . Breast cancer Sister 25       BRCA2 positive; metastatic to bones at age 68, then spread to liver  . Cancer Other        female cancers, bone cancer  . Cancer Maternal Grandmother        deceased 25; unk. primary; possibly stomach  . Tuberculosis Maternal Grandfather   . Stroke Paternal Grandmother 7       died of cerebral hemorrhage  . Breast cancer Other     ALLERGIES:  is allergic to contrast media [iodinated diagnostic agents]; iohexol; lisinopril; sulfa antibiotics; latex; codeine; levofloxacin; and lipitor [atorvastatin calcium].  MEDICATIONS:  Current Outpatient Prescriptions  Medication Sig Dispense Refill  . acetaminophen (TYLENOL) 500 MG tablet Take 1,000 mg by mouth every 6 (six) hours as needed (for pain/headaches.).    Marland Kitchen albuterol (PROVENTIL) (2.5 MG/3ML) 0.083% nebulizer solution USE 1 VIAL BY NEBULIZATION EVERY FOUR HOURS AS NEEDED FOR WHEEZING OR SHORTNESS OF BREATH. 270 mL 0  . bisoprolol-hydrochlorothiazide (ZIAC) 10-6.25 MG tablet Take 1 tablet by mouth daily. 90 tablet 0  . Calcium Carbonate-Vitamin D (CALCIUM 600+D) 600-400 MG-UNIT per tablet Take 1 tablet by mouth daily.     . cetirizine (KLS ALLER-TEC) 10 MG tablet Take 10 mg by mouth daily.    . Cholecalciferol (VITAMIN D) 2000 UNITS tablet Take 4,000 Units by mouth daily.    . fluticasone (FLONASE) 50 MCG/ACT nasal spray Place 2 sprays into both nostrils daily. (Patient taking differently: Place 2 sprays into both nostrils every evening. ) 48 g 3  . Fluticasone-Umeclidin-Vilant (TRELEGY ELLIPTA) 100-62.5-25 MCG/INH AEPB Inhale 1 puff into the lungs daily. 180 each 3  . furosemide (LASIX) 20 MG tablet Take 20 mg by mouth daily as needed (for fluid retention/edema).     . metFORMIN (GLUCOPHAGE-XR) 500 MG  24 hr tablet TAKE 1 TABLET THREE TIMES DAILY (Patient taking differently: TAKE 1 TABLET THREE TIMES DAILY IN THE MORNING, AT LUNCH, & AT BEDTIME.) 270 tablet 1  . Multiple Vitamins-Minerals (CENTRUM SILVER PO) Take 1 tablet by mouth daily.     . naproxen (NAPROSYN) 500 MG tablet Take 1 tablet (500 mg total) by mouth 2 (two) times daily with a meal. 30 tablet 0  . phenazopyridine (PYRIDIUM) 200 MG tablet Take 1 tablet (200 mg total) by mouth 3 (three) times daily as needed for pain. 10 tablet 0  . simvastatin (ZOCOR) 20 MG tablet Take 20 mg by mouth every evening.    Marland Kitchen  triamcinolone cream (KENALOG) 0.1 % Apply 1 application topically 2 (two) times daily. Applied to affected areas on legs.  1  . TRUEPLUS LANCETS 33G MISC TEST ONE TIME DAILY (Patient taking differently: TEST ONCE TO TWICE DAILY) 100 each 3  . valACYclovir (VALTREX) 500 MG tablet Take 500 mg by mouth daily.     No current facility-administered medications for this visit.     REVIEW OF SYSTEMS:   Constitutional: Denies fevers, chills or abnormal night sweats Eyes: Denies blurriness of vision, double vision or watery eyes Ears, nose, mouth, throat, and face: Denies mucositis or sore throat Respiratory: Denies cough, dyspnea or wheezes Cardiovascular: Denies palpitation, chest discomfort or lower extremity swelling Gastrointestinal:  Denies nausea, heartburn or change in bowel habits Skin: Denies abnormal skin rashes Lymphatics: Denies new lymphadenopathy or easy bruising Neurological:Denies numbness, tingling or new weaknesses Behavioral/Psych: Mood is stable, no new changes   All other systems were reviewed with the patient and are negative.  PHYSICAL EXAMINATION: ECOG PERFORMANCE STATUS: 1 - Symptomatic but completely ambulatory  Vitals:   02/16/17 1251  BP: 137/64  Pulse: 91  Resp: 18  Temp: 98.3 F (36.8 C)  SpO2: 91%   Filed Weights   02/16/17 1251  Weight: 248 lb 6.4 oz (112.7 kg)    GENERAL:alert, no  distress and comfortable SKIN: skin color, texture, turgor are normal, no rashes or significant lesions EYES: normal, conjunctiva are pink and non-injected, sclera clear OROPHARYNX:no exudate, no erythema and lips, buccal mucosa, and tongue normal  NECK: supple, thyroid normal size, non-tender, without nodularity LYMPH:  no palpable lymphadenopathy in the cervical, axillary or inguinal LUNGS: clear to auscultation and percussion with normal breathing effort HEART: regular rate & rhythm and no murmurs and no lower extremity edema ABDOMEN:abdomen soft, non-tender and normal bowel sounds Musculoskeletal:no cyanosis of digits and no clubbing  PSYCH: alert & oriented x 3 with fluent speech NEURO: no focal motor/sensory deficits  LABORATORY DATA:  I have reviewed the data as listed Lab Results  Component Value Date   WBC 4.8 01/08/2017   HGB 12.6 01/08/2017   HCT 39.1 01/08/2017   MCV 91.1 01/08/2017   PLT 125 (L) 01/08/2017   Lab Results  Component Value Date   NA 142 01/08/2017   K 4.8 01/08/2017   CL 105 01/08/2017   CO2 29 01/08/2017    RADIOGRAPHIC STUDIES: I have personally reviewed the radiological reports and agreed with the findings in the report.  ASSESSMENT AND PLAN:  Atypical ductal hyperplasia of left breast Left breast atypical ductal hyperplasia and atypical lobular hyperplasia status post left lumpectomy 12/22/2016  Pathology review: I discussed the difference between atypical ductal hyperplasia, DCIS and invasive breast cancer. I explained to her that atypical ductal hyperplasia is characterized by a proliferation of uniform epithelial cells filling part, but not the entirety, of the involved duct. ADH is associated with an increased risk of both ipsilateral and contralateral breast cancer and thus provides evidence of underlying breast abnormalities that predispose to breast cancer.   Prognosis:Using the American Cancer Society breast cancer risk assessment tool,  her risk of breast cancer in 5 years is at 3.75% ( average woman's risk is 2.2%), her lifetime risk of breast cancers at 20.4% ( average risk is a 6%) I adjusted her risk based upon unilateral mastectomy.  I discussed the risks and benefits of tamoxifen therapy. I believe that the risks far outweigh any benefits from tamoxifen. Instead I recommended the following 1. Exercise 30 minutes  daily 2. Weight loss 3. Increasing fruits and vegetables and less red meat  Surveillance plan: Annual mammograms and breast exams and follow-up with her primary care physician. Patient will be seen on an as-needed basis.    Bladder mass Bladder tumor: Right lateral bladder tumor: Low-grade papillary urothelial carcinoma, no evidence of invasion, muscularis propria present and free of cancer.    All questions were answered. The patient knows to call the clinic with any problems, questions or concerns.    Rulon Eisenmenger, MD 02/16/17

## 2017-02-16 NOTE — Assessment & Plan Note (Signed)
Bladder tumor: Right lateral bladder tumor: Low-grade papillary urothelial carcinoma, no evidence of invasion, muscularis propria present and free of cancer.

## 2017-02-26 ENCOUNTER — Ambulatory Visit (INDEPENDENT_AMBULATORY_CARE_PROVIDER_SITE_OTHER): Payer: Medicare HMO | Admitting: Family Medicine

## 2017-02-26 ENCOUNTER — Encounter: Payer: Self-pay | Admitting: Family Medicine

## 2017-02-26 VITALS — BP 120/68 | HR 68 | Ht 61.5 in | Wt 242.2 lb

## 2017-02-26 DIAGNOSIS — M7062 Trochanteric bursitis, left hip: Secondary | ICD-10-CM | POA: Diagnosis not present

## 2017-02-26 DIAGNOSIS — R252 Cramp and spasm: Secondary | ICD-10-CM

## 2017-02-26 DIAGNOSIS — M7061 Trochanteric bursitis, right hip: Secondary | ICD-10-CM | POA: Diagnosis not present

## 2017-02-26 MED ORDER — LIDOCAINE HCL 2 % IJ SOLN
3.0000 mL | Freq: Once | INTRAMUSCULAR | Status: AC
Start: 2017-02-26 — End: 2017-02-26
  Administered 2017-02-26: 60 mg via INTRADERMAL

## 2017-02-26 MED ORDER — TRIAMCINOLONE ACETONIDE 40 MG/ML IJ SUSP
20.0000 mg | Freq: Once | INTRAMUSCULAR | Status: AC
  Administered 2017-02-26: 20 mg via INTRA_ARTICULAR

## 2017-02-26 NOTE — Patient Instructions (Signed)
Continue to drink plenty of fluids. We will be in touch with your lab results tomorrow. I hope you get some relief from the cortisone shot today for your bursitis.   Hip Bursitis Hip bursitis is inflammation of a fluid-filled sac (bursa) in the hip joint. The bursa protects the bones in the hip joint from rubbing against each other. Hip bursitis can cause mild to moderate pain, and symptoms often come and go over time. What are the causes? This condition may be caused by:  Injury to the hip.  Overuse of the muscles that surround the hip joint.  Arthritis or gout.  Diabetes.  Thyroid disease.  Cold weather.  Infection.  In some cases, the cause may not be known. What are the signs or symptoms? Symptoms of this condition may include:  Mild or moderate pain in the hip area. Pain may get worse with movement.  Tenderness and swelling of the hip, especially on the outer side of the hip.  Symptoms may come and go. If the bursa becomes infected, you may have the following symptoms:  Fever.  Red skin and a feeling of warmth in the hip area.  How is this diagnosed? This condition may be diagnosed based on:  A physical exam.  Your medical history.  X-rays.  Removal of fluid from your inflamed bursa for testing (biopsy).  You may be sent to a health care provider who specializes in bone diseases (orthopedist) or a provider who specializes in joint inflammation (rheumatologist). How is this treated? This condition is treated by resting, raising (elevating), and applying pressure(compression) to the injured area. In some cases, this may be enough to make your symptoms go away. Treatment may also include:  Crutches.  Antibiotic medicine.  Draining fluid out of the bursa to help relieve swelling.  Injecting medicine that helps to reduce inflammation (cortisone).  Follow these instructions at home: Medicines  Take over-the-counter and prescription medicines only as  told by your health care provider.  Do not drive or operate heavy machinery while taking prescription pain medicine, or as told by your health care provider.  If you were prescribed an antibiotic, take it as told by your health care provider. Do not stop taking the antibiotic even if you start to feel better. Activity  Return to your normal activities as told by your health care provider. Ask your health care provider what activities are safe for you.  Rest and protect your hip as much as possible until your pain and swelling get better. General instructions  Wear compression wraps only as told by your health care provider.  Elevate your hip above the level of your heart as much as you can without pain. To do this, try putting a pillow under your hips while you lie down.  Do not use your hip to support your body weight until your health care provider says that you can. Use crutches as told by your health care provider.  Gently massage and stretch your injured area as often as is comfortable.  Keep all follow-up visits as told by your health care provider. This is important. How is this prevented?  Exercise regularly, as told by your health care provider.  Warm up and stretch before being active.  Cool down and stretch after being active.  If an activity irritates your hip or causes pain, avoid the activity as much as possible.  Avoid sitting down for long periods at a time. Contact a health care provider if:  You have  a fever.  You develop new symptoms.  You have difficulty walking or doing everyday activities.  You have pain that gets worse or does not get better with medicine.  You develop red skin or a feeling of warmth in your hip area. Get help right away if:  You cannot move your hip.  You have severe pain. This information is not intended to replace advice given to you by your health care provider. Make sure you discuss any questions you have with your health  care provider. Document Released: 12/06/2001 Document Revised: 11/22/2015 Document Reviewed: 01/16/2015 Elsevier Interactive Patient Education  [employer].

## 2017-02-26 NOTE — Progress Notes (Signed)
Chief Complaint  Patient presents with  . Cramping    in hands, legs and neck. Yesterday increased water intake, ate mustard and 3 bananas and did not make any difference.   . Bursitis    naproxen did not help for the bursitis at all.   . Flu Vaccine    has lab appr 9/6 and will get flu shot then.   Hands, legs, neck and feet are cramping.  She had a cramp in the back of the right leg 2 nights ago that was very severe, couldn't stand, and is still slightly sore. She had dozed off in the chair, got a cramp when she went to get up to go to the bathroom.  That is the first time she had leg cramp, more often has in her feet and fingers (ie brushing teeth).  Today she had a cramp in the left side of her posterior neck, more often gets it on the right side.   She has been having cramps "for a long time", a few years, but more severe recently.    Yesterday she had 3 bananas, a tablespoon of mustard, and drank a lot of water, which hasn't made a significant difference (these things have helped in the past for her cramps). Hasn't taken any lasix in the last month.  Hip bursitis:  She took naproxen for 2 weeks.  She tried sleeping in bed again (rather than the recliner) after being on it a few days, but woke up in pain still.  She presents today for cortisone injection.  She only sleeps on her right side, so asking just for treatment of the right hip today.  Sugars have been good, 89-159 in the mornings, mostly 120's.  PMH, PSH, SH reviewed  Outpatient Encounter Prescriptions as of 02/26/2017  Medication Sig Note  . bisoprolol-hydrochlorothiazide (ZIAC) 10-6.25 MG tablet Take 1 tablet by mouth daily.   . Calcium Carbonate-Vitamin D (CALCIUM 600+D) 600-400 MG-UNIT per tablet Take 1 tablet by mouth daily.  02/26/2017: taking  . cetirizine (KLS ALLER-TEC) 10 MG tablet Take 10 mg by mouth daily.   . Cholecalciferol (VITAMIN D) 2000 UNITS tablet Take 4,000 Units by mouth daily. 02/26/2017: taking  .  fluticasone (FLONASE) 50 MCG/ACT nasal spray Place 2 sprays into both nostrils daily. (Patient taking differently: Place 2 sprays into both nostrils every evening. )   . Fluticasone-Umeclidin-Vilant (TRELEGY ELLIPTA) 100-62.5-25 MCG/INH AEPB Inhale 1 puff into the lungs daily.   . metFORMIN (GLUCOPHAGE-XR) 500 MG 24 hr tablet TAKE 1 TABLET THREE TIMES DAILY (Patient taking differently: TAKE 1 TABLET THREE TIMES DAILY IN THE MORNING, AT LUNCH, & AT BEDTIME.)   . Multiple Vitamins-Minerals (CENTRUM SILVER PO) Take 1 tablet by mouth daily.  02/26/2017: taking  . simvastatin (ZOCOR) 20 MG tablet Take 20 mg by mouth every evening.   . triamcinolone cream (KENALOG) 0.1 % Apply 1 application topically 2 (two) times daily. Applied to affected areas on legs.   . TRUEPLUS LANCETS 33G MISC TEST ONE TIME DAILY (Patient taking differently: TEST ONCE TO TWICE DAILY)   . valACYclovir (VALTREX) 500 MG tablet Take 500 mg by mouth daily.   Marland Kitchen acetaminophen (TYLENOL) 500 MG tablet Take 1,000 mg by mouth every 6 (six) hours as needed (for pain/headaches.).   Marland Kitchen albuterol (PROVENTIL) (2.5 MG/3ML) 0.083% nebulizer solution USE 1 VIAL BY NEBULIZATION EVERY FOUR HOURS AS NEEDED FOR WHEEZING OR SHORTNESS OF BREATH. (Patient not taking: Reported on 02/26/2017)   . furosemide (LASIX) 20 MG tablet  Take 20 mg by mouth daily as needed (for fluid retention/edema).    . [DISCONTINUED] naproxen (NAPROSYN) 500 MG tablet Take 1 tablet (500 mg total) by mouth 2 (two) times daily with a meal.   . [DISCONTINUED] phenazopyridine (PYRIDIUM) 200 MG tablet Take 1 tablet (200 mg total) by mouth 3 (three) times daily as needed for pain.   . [EXPIRED] lidocaine (XYLOCAINE) 2 % (with pres) injection 60 mg    . [EXPIRED] triamcinolone acetonide (KENALOG-40) injection 20 mg     No facility-administered encounter medications on file as of 02/26/2017.    Allergies  Allergen Reactions  . Contrast Media [Iodinated Diagnostic Agents] Anaphylaxis and  Shortness Of Breath    Could not breath  . Iohexol Anaphylaxis and Shortness Of Breath    Immediately could not breathe  . Lisinopril Anaphylaxis, Shortness Of Breath and Rash  . Sulfa Antibiotics Anaphylaxis and Other (See Comments)    Historical from mother, pt states that mother says she almost died from this drug  . Latex Other (See Comments)    blisters  . Codeine Nausea Only, Anxiety and Other (See Comments)    insomnia  . Levofloxacin Other (See Comments)    insomnia  . Lipitor [Atorvastatin Calcium] Rash   ROS: no fever, chills, URI symptoms, breathing is at baseline. Not currently getting much exercise.  No chest pain, palpitations, nausea, vomiting, diarrhea.  Swelling is per usual, no worse. +muscle cramps as per HPI.  Moods are good. No bleeding, bruising, rash or other complaints.  PHYSICAL EXAM:  BP 120/68 (BP Location: Left Arm, Patient Position: Sitting, Cuff Size: Normal)   Pulse 68   Ht 5' 1.5" (1.562 m)   Wt 242 lb 3.2 oz (109.9 kg)   LMP  (LMP Unknown)   BMI 45.02 kg/m   Well appearing, pleasant, obese female in no distress HEENT: conjunctiva and sclera are clear, EOMI Neck: muscles nontender, no spasm, no lymphadenopathy Heart: regular rate and rhythm with 3/6 SEM, unchanged Lungs: clear bilaterally Extremities: hyperpigmentation of bilateral lower legs, unchanged.  Trace to 1+ pitting edema.  Very small focal area within the right medial gastroc that is slightly sore,  No cord, mass, tenderness, negative Homan Tender over the right trochanteric bursa (and left).  Procedure: Area was cleansed with alcohol. Ethyl chloride spray was applied, then trochanteric bursa was injected with 5m of kenalog, and 1.5 cc of 2% lidocaine without epi. She tolerated procedure well, and had improvement in pain.  ASSESSMENT/PLAN:  Muscle cramps - check electrolytes for underlying cause, encouraged adequate hydration. no e/o DVT or other pathology on exam - Plan:  Magnesium, Basic metabolic panel  Trochanteric bursitis of both hips - right hip injected today (the side she sleeps on) - Plan: lidocaine (XYLOCAINE) 2 % (with pres) injection 60 mg, triamcinolone acetonide (KENALOG-40) injection 20 mg, PR DRAIN/INJECT INTERMEDIATE JOINT/BURSA   b-met, Mg  F/u as scheduled in September

## 2017-02-27 LAB — BASIC METABOLIC PANEL
BUN: 19 mg/dL (ref 7–25)
CO2: 24 mmol/L (ref 20–32)
Calcium: 10 mg/dL (ref 8.6–10.4)
Chloride: 103 mmol/L (ref 98–110)
Creat: 0.78 mg/dL (ref 0.60–0.93)
Glucose, Bld: 110 mg/dL — ABNORMAL HIGH (ref 65–99)
Potassium: 4.6 mmol/L (ref 3.5–5.3)
Sodium: 140 mmol/L (ref 135–146)

## 2017-02-27 LAB — MAGNESIUM: Magnesium: 1.9 mg/dL (ref 1.5–2.5)

## 2017-03-05 ENCOUNTER — Other Ambulatory Visit (INDEPENDENT_AMBULATORY_CARE_PROVIDER_SITE_OTHER): Payer: Medicare HMO

## 2017-03-05 DIAGNOSIS — J449 Chronic obstructive pulmonary disease, unspecified: Secondary | ICD-10-CM | POA: Diagnosis not present

## 2017-03-05 DIAGNOSIS — E7800 Pure hypercholesterolemia, unspecified: Secondary | ICD-10-CM

## 2017-03-05 DIAGNOSIS — Z23 Encounter for immunization: Secondary | ICD-10-CM | POA: Diagnosis not present

## 2017-03-05 DIAGNOSIS — J441 Chronic obstructive pulmonary disease with (acute) exacerbation: Secondary | ICD-10-CM | POA: Diagnosis not present

## 2017-03-05 DIAGNOSIS — I10 Essential (primary) hypertension: Secondary | ICD-10-CM | POA: Diagnosis not present

## 2017-03-05 DIAGNOSIS — E119 Type 2 diabetes mellitus without complications: Secondary | ICD-10-CM | POA: Diagnosis not present

## 2017-03-09 LAB — COMPREHENSIVE METABOLIC PANEL
AG Ratio: 1.7 (calc) (ref 1.0–2.5)
ALT: 25 U/L (ref 6–29)
AST: 29 U/L (ref 10–35)
Albumin: 4.1 g/dL (ref 3.6–5.1)
Alkaline phosphatase (APISO): 59 U/L (ref 33–130)
BUN: 18 mg/dL (ref 7–25)
CO2: 31 mmol/L (ref 20–32)
Calcium: 9.2 mg/dL (ref 8.6–10.4)
Chloride: 102 mmol/L (ref 98–110)
Creat: 0.75 mg/dL (ref 0.60–0.93)
Globulin: 2.4 g/dL (calc) (ref 1.9–3.7)
Glucose, Bld: 139 mg/dL — ABNORMAL HIGH (ref 65–99)
Potassium: 4.1 mmol/L (ref 3.5–5.3)
Sodium: 139 mmol/L (ref 135–146)
Total Bilirubin: 0.7 mg/dL (ref 0.2–1.2)
Total Protein: 6.5 g/dL (ref 6.1–8.1)

## 2017-03-09 LAB — HEMOGLOBIN A1C
Hgb A1c MFr Bld: 6 % of total Hgb — ABNORMAL HIGH (ref <5.7)
Mean Plasma Glucose: 126 (calc)
eAG (mmol/L): 7 (calc)

## 2017-03-09 LAB — TSH: TSH: 2.29 mIU/L (ref 0.40–4.50)

## 2017-03-10 DIAGNOSIS — C689 Malignant neoplasm of urinary organ, unspecified: Secondary | ICD-10-CM | POA: Insufficient documentation

## 2017-03-10 HISTORY — DX: Malignant neoplasm of urinary organ, unspecified: C68.9

## 2017-03-10 NOTE — Progress Notes (Signed)
Chief Complaint  Patient presents with  . Medicare Wellness    nonfasting annual wellness exam with pap. Still having cramping in her hands and feet.     Shannon Schultz is a 71 y.o. female who presents for annual wellness visit and follow-up on chronic medical conditions.  She has the following concerns:  Trochanteric bursitis--treated with cortisone injection into the right trochanteric bursa on 8/30, after not getting any benefit from 2 week course of naproxen.  She is now able to sleep in her bed, and stay asleep much longer.  Her husband wakes her up a lot (tossing and turning, singing), not the pain (but still has trouble getting back to sleep).  She was also complaining of significant muscle cramps at that time.  No etiology was found on labs (normal b-met, Mg done that day). She has been trying to drink more water.  Really hasn't noticed much improvement in cramping of hands, feet, legs.  Diabetes: Blood sugars at home are running 115-120's in the mornings; 147 this morning (had chips on her taco salad last night). Not checking other times of the day. Denies hypoglycemia. Denies polydipsia and polyuria. Last eye exam was February 2017 per her chart--she states she had one this year as well at [facility]. She has cataracts, no new vision changes. Patient follows a low sugar diet and checks feet regularly without concerns. Denies numbness, tingling. Some intermittent numbness in the 2nd and 3rd toes on the right foot (h/o fractures, didn't heal right, per pt). Takes metformin TID--this seems to be the most tolerable way to take it.  Hyperlipidemia follow-up: Patient is reportedly following a low-fat, low cholesterol diet. Compliant with medications and denies medication side effects.  Lab Results  Component Value Date   CHOL 113 08/18/2016   HDL 51 08/18/2016   LDLCALC 43 08/18/2016   TRIG 93 08/18/2016   CHOLHDL 2.2 08/18/2016    Hypertension: Blood pressure hasn't checked at  home in a while. Denies headaches, dizziness, chest pain. Denies side effects of medications. Compliant with medication.   COPD: She is seeing pulmonary, meds adjusted (now taking Trelegy Ellipta once daily). She completed pulmonary rehab. She uses oxygen prn with exertion. She is in the donut hole, had to pay out of pocket, and will check for samples or go back to her prior med to last until January.  She signed back on Weight Watchers, with a starting weight of 250# (emotional eating after her sister died). She has lost 10# since she started.  She has been noted to have fatty liver, with some cirrhosis noted on last CT scan.  Allergies:  Using flonase nightly, allergies are controlled.  She was diagnosed with atypical ductal hyperplasia after a mammogram revealed some abnormalities. She underwent left lumpectomy on 12/22/16 and final pathology revealed atypical ductal hyperplasia and atypical lobular hyperplasia. She last saw Dr. Lindi Adie (onc) 02/16/17.  No additional treatments were recommended, just 30" exercise daily, weight loss, more fruits/vegetables and less red meat.  CT scan performed 11/2016 as part of evaluation for hernia repair showed: IMPRESSION: 1. Interval development of cirrhotic changes in the liver. 2. 14 mm polypoid lesion posterior right bladder wall, highly suspicious for urothelial neoplasm. 3. Complex constellation of ventral fascial defects, the dominant hernia is right paraumbilical containing small bowel without complicating features.  She has f/u with Dr. Marlou Starks next month, and plans to discuss hernia surgery then.  She had bladder mass removed by urology in 12/2016, and pathology showed  LOW GRADE PAPILLARY UROTHELIAL CARCINOMA She was treated with intravesicular post-op chemo, and is due for f/u cystoscopy next month (3 mo f/u)  On valtrex preventatively for HSV flare on hip.  She is in the donut hole and can't refill, so plans to cut back or use just prn until  January.  Immunization History  Administered Date(s) Administered  . Influenza Split 05/28/2011  . Influenza, High Dose Seasonal PF 04/07/2013, 05/15/2014, 05/30/2015, 03/25/2016, 03/05/2017  . Influenza, Seasonal, Injecte, Preservative Fre 06/07/2012  . Pneumococcal Conjugate-13 07/06/2014  . Pneumococcal Polysaccharide-23 12/15/2004, 05/28/2011  . Tdap 02/23/2008  . Zoster 05/17/2010   Last Pap smear: 06/2013--normal, no high risk HPV detected Last mammogram: April and May 2018 (screening/diagnostic) Last colonoscopy: 12/2015 with Dr. Mann--diverticulosis and internal hemorrhoids.  F/U recommend 7 years (12/2022) Last DEXA: 12/2014 normal Dentist: twice a year Ophtho: yearly, 08/2015 per chart, she states she did go this year (no records sent). Exercise:  none currently, just keeping her grandchildren.   Negative fall and depression screen. Function status screen: see epic screen, more details noted here: some weakness in the legs only going upstairs. "constant roar in her ears" (has seen ENT for this years ago--no change; some slight hearing loss); has cataracts--foggy vision at night, unchanged (not ready for surgery)  Dermatologist--can't remember name Durward Fortes?) Dentist--Dr. Elizebeth Brooking at [facility] GI--Dr. Collene Mares Cardiologist: Dr. Marlou Porch Oncologist: Dr. Lindi Adie Surgeon: Dr. Marlou Starks Urologist: Dr. Alinda Money Pulmonary: Dr. Vaughan Browner  End of Life Discussion:  Patient does not have a living will and medical power of attorney.  She still has the paperwork from last year, needs to complete. Plans to do in the near future (after dealing with it for her sister, she realizes the importance).  Past Medical History:  Diagnosis Date  . Allergy   . Aortic stenosis    mild-mod by 04/08/16 echo  . Arthritis   . Cancer ([place]) 1992   R breast, DCIS,bladder ca (just dx)  . Colon polyp   . Complication of anesthesia    92    "local anesthesia" used was hard to awaken  from-no problems since  . COPD (chronic obstructive pulmonary disease) ([place])   . Diverticulosis   . Dyspnea   . Elevated cholesterol   . Elevated liver enzymes    fatty liver per ultrasound per pt  . Family history of malignant neoplasm of breast   . FHx: BRCA2 gene positive    sister with BRCA2 mutation (pt tested NEGATIVE)  . Genital HSV    gets on hip  . Heart murmur     echo 05/2011 mild aortic stenosis  . HSV (herpes simplex virus) infection    on hip--on daily suppression  . Hypertension   . Hypothyroidism    took med 7 yrs after birth of 1st child  . Impaired glucose tolerance   . Migraine   . Osteopenia   . Pneumonia 06/06/2011  . Type 2 diabetes mellitus (HCC)    type 2  . Vitamin D deficiency disease     Past Surgical History:  Procedure Laterality Date  . bladder tumor rescestion      Dr. Alinda Money 01/12/17  . BREAST LUMPECTOMY WITH RADIOACTIVE SEED LOCALIZATION Left 12/22/2016   Procedure: LEFT BREAST LUMPECTOMY WITH RADIOACTIVE SEED LOCALIZATION;  Surgeon: Jovita Kussmaul, MD;  Location: [facility];  Service: General;  Laterality: Left;  . CHOLECYSTECTOMY  2003  . COLONOSCOPY  2009, 08/2010, 12/2015   Dr. Collene Mares  . CYSTOSCOPY W/ RETROGRADES  Bilateral 01/12/2017   Procedure: CYSTOSCOPY WITH RETROGRADE PYELOGRAM/ EXAM UNDER ANESTHESIA;  Surgeon: Raynelle Bring, MD;  Location: [facility];  Service: Urology;  Laterality: Bilateral;  . HERNIA REPAIR  [id number]   umbilical  . MASTECTOMY Right 1992   R breast for cancer  . TRANSURETHRAL RESECTION OF BLADDER TUMOR WITH MITOMYCIN-C N/A 01/12/2017   Procedure: TRANSURETHRAL RESECTION OF BLADDER TUMOR WITH POSSIBLE POST OPERATIVE INSTILLATION OF MITOMYCIN-C;  Surgeon: Raynelle Bring, MD;  Location: [facility];  Service: Urology;  Laterality: N/A;    Social History   Social History  . Marital status: Married    Spouse name: N/A  . Number of children: 3  . Years of education: N/A   Occupational History  . Retired    Social History Main  Topics  . Smoking status: Former Smoker    Packs/day: 1.00    Years: 46.00    Types: Cigarettes    Quit date: 05/31/2011  . Smokeless tobacco: Never Used  . Alcohol use No     Comment: rarely, 1 drink per year maybe  . Drug use: No  . Sexual activity: Yes    Partners: Male    Birth control/ protection: Post-menopausal   Other Topics Concern  . Not on file   Social History Narrative   Lives with her husband.  Children all live in [place] nearby. No pets. 6 grandchildren.       Family History  Problem Relation Age of Onset  . Heart disease Father   . Diabetes Father   . Hypertension Father   . Asthma Sister   . Allergies Sister   . Hyperlipidemia Sister   . Hashimoto's thyroiditis Sister   . Fibromyalgia Sister   . Heart disease Mother        tachycardia  . Asthma Sister   . Allergies Sister   . Hyperlipidemia Sister   . Hashimoto's thyroiditis Sister   . Fibromyalgia Sister   . Breast cancer Sister 27       BRCA2 positive; metastatic to bones at age 53, then spread to liver  . Cancer Other        female cancers, bone cancer  . Cancer Maternal Grandmother        deceased 61; unk. primary; possibly stomach  . Tuberculosis Maternal Grandfather   . Stroke Paternal Grandmother 57       died of cerebral hemorrhage    Outpatient Encounter Prescriptions as of 03/11/2017  Medication Sig Note  . bisoprolol-hydrochlorothiazide (ZIAC) 10-6.25 MG tablet Take 1 tablet by mouth daily.   . Calcium Carbonate-Vitamin D (CALCIUM 600+D) 600-400 MG-UNIT per tablet Take 1 tablet by mouth daily.  02/26/2017: taking  . cetirizine (KLS ALLER-TEC) 10 MG tablet Take 10 mg by mouth daily.   . Cholecalciferol (VITAMIN D) 2000 UNITS tablet Take 4,000 Units by mouth daily. 02/26/2017: taking  . fluticasone (FLONASE) 50 MCG/ACT nasal spray Place 2 sprays into both nostrils daily. (Patient taking differently: Place 2 sprays into both nostrils every evening. )   . Fluticasone-Umeclidin-Vilant (TRELEGY  ELLIPTA) 100-62.5-25 MCG/INH AEPB Inhale 1 puff into the lungs daily.   . metFORMIN (GLUCOPHAGE-XR) 500 MG 24 hr tablet Take 1 tablet (500 mg total) by mouth 3 (three) times daily.   . Multiple Vitamins-Minerals (CENTRUM SILVER PO) Take 1 tablet by mouth daily.  02/26/2017: taking  . simvastatin (ZOCOR) 20 MG tablet Take 20 mg by mouth every evening.   . TRUEPLUS LANCETS 33G MISC TEST ONE TIME DAILY (Patient taking  differently: TEST ONCE TO TWICE DAILY)   . valACYclovir (VALTREX) 500 MG tablet Take 500 mg by mouth daily.   . [DISCONTINUED] metFORMIN (GLUCOPHAGE-XR) 500 MG 24 hr tablet TAKE 1 TABLET THREE TIMES DAILY (Patient taking differently: TAKE 1 TABLET THREE TIMES DAILY IN THE MORNING, AT LUNCH, & AT BEDTIME.)   . acetaminophen (TYLENOL) 500 MG tablet Take 1,000 mg by mouth every 6 (six) hours as needed (for pain/headaches.).   Marland Kitchen albuterol (PROVENTIL) (2.5 MG/3ML) 0.083% nebulizer solution USE 1 VIAL BY NEBULIZATION EVERY FOUR HOURS AS NEEDED FOR WHEEZING OR SHORTNESS OF BREATH. (Patient not taking: Reported on 02/26/2017)   . furosemide (LASIX) 20 MG tablet Take 20 mg by mouth daily as needed (for fluid retention/edema).  03/11/2017: Hasn't needed any in the last month  . triamcinolone cream (KENALOG) 0.1 % Apply 1 application topically 2 (two) times daily. Applied to affected areas on legs.    No facility-administered encounter medications on file as of 03/11/2017.     Allergies  Allergen Reactions  . Contrast Media [Iodinated Diagnostic Agents] Anaphylaxis and Shortness Of Breath    Could not breath  . Iohexol Anaphylaxis and Shortness Of Breath    Immediately could not breathe  . Lisinopril Anaphylaxis, Shortness Of Breath and Rash  . Sulfa Antibiotics Anaphylaxis and Other (See Comments)    Historical from mother, pt states that mother says she almost died from this drug  . Latex Other (See Comments)    blisters  . Codeine Nausea Only, Anxiety and Other (See Comments)    insomnia   . Levofloxacin Other (See Comments)    insomnia  . Lipitor [Atorvastatin Calcium] Rash    ROS: The patient denies anorexia, fever, vision changes (foggy vision at night related to cataracts), decreased hearing (slight loss, chronic tinnitus/"roar"), ear pain, sore throat, breast concerns, chest pain, palpitations, dizziness, syncope, cough, swelling, nausea, vomiting, constipation, abdominal pain, melena, hematochezia, indigestion/heartburn, hematuria, incontinence, dysuria, vaginal bleeding, discharge, odor or itch, genital lesions, numbness (previously had L-sided facial numbness x 5-10 mins very sporadically, hasn't had it recently; never had other associated symptoms--no weakness, slurred speech, or associated with headache), tingling, weakness, tremor, suspicious skin lesions, depression, anxiety, abnormal bleeding/bruising, or enlarged lymph nodes. Denies any memory concerns. Some chronic mild allergies. No migraines Only rare gassiness/diarrhea (related to metformin--had more diarrhea when all tablets taken together; tolerable taking it TID) Some hand pain from arthritis, mostly in the thumbs; rare knee discomfort Intermittent hoarseness, from Trelegy.  Some tingling in right 2nd and 3rd toes, chronic/stable. Recent flare of some low back pain after moving furniture this week   PHYSICAL EXAM:  BP 136/72 (BP Location: Left Arm, Patient Position: Sitting, Cuff Size: Normal)   Pulse 76   Ht 5' 3.75" (1.619 m)   Wt 241 lb 9.6 oz (109.6 kg)   LMP  (LMP Unknown)   BMI 41.80 kg/m   Wt Readings from Last 3 Encounters:  03/11/17 241 lb 9.6 oz (109.6 kg)  02/26/17 242 lb 3.2 oz (109.9 kg)  02/16/17 248 lb 6.4 oz (112.7 kg)     General Appearance:  Alert, cooperative, no distress, appears stated age. Comfortable in no distress, until she had some leg cramps during pelvic exam.  Head:  Normocephalic, without obvious abnormality, atraumatic   Eyes:  PERRL, conjunctiva/corneas  clear, EOM's intact, fundi benign   Ears:  Normal TM's and external ear canals   Nose:  Nares normal, mucosa normal, no drainage or sinus tenderness   Throat:  Lips, mucosa, and tongue normal; teeth and gums normal   Neck:  Supple, no lymphadenopathy; thyroid: no enlargement/tenderness/nodules; no carotid bruit or JVD   Back:  Spine nontender, no curvature, ROM normal, no CVA tenderness   Lungs:  Clear to auscultation bilaterally without wheezes, rales or ronchi; respirations unlabored   Chest Wall:  No tenderness or mass. Absence of R breast, WHSS.   Heart:  Regular rate and rhythm, S1 and S2 normal, no rub or gallop. 2-3/6 SEM loudest at RUSB, and radiates into R>L carotid   Breast Exam:  No tenderness, masses, or nipple discharge or inversion of L breast. WHSS along superior aspect of areola. No axillary lymphadenopathy. Right breast is absent.   Abdomen:  Soft, non-tender, nondistended, normoactive bowel sounds, no masses, no hepatosplenomegaly. + abdominal obesity. Some firmness around umbilicus, no bulge. Large ventral hernia, nontender.  Genitalia:  Normal external genitalia without lesions, mild atrophic changes. BUS and vagina normal; No cervical lesions, cervical motion tenderness. No abnormal vaginal discharge. Uterus and adnexa not enlarged, nontender, no masses, although exam is limited by body habitus. Pap performed   Rectal:  Normal sphincter tone, no mass. Heme negative stool  Extremities:  Trace pretibial edema. Normal diabetic foot exam. Tender at L trochanteric bursa, nontender on the right  Pulses:  2+ and symmetric all extremities   Skin:  Skin color, texture, turgor normal, no rashes or lesions. Venous stasis changes bilateral lower legs with (hyperpigmented diffusely); some prominent superficial veins in feet giving bluish discoloration, L>R. Feet are very dry, flaky. Normal sensation to monofilament  Lymph nodes:  Cervical, supraclavicular, and  axillary nodes normal   Neurologic:  CNII-XII intact, normal strength, sensation and gait; reflexes 2+ and symmetric throughout    Psych:  Normal mood, affect, hygiene and grooming  Diabetic foot exam performed  Recent labs: Lab Results  Component Value Date   HGBA1C 6.0 (H) 03/05/2017   Fasting glucose 139  Lab Results  Component Value Date   TSH 2.29 03/05/2017     Chemistry      Component Value Date/Time   NA 139 03/05/2017 0736   K 4.1 03/05/2017 0736   CL 102 03/05/2017 0736   CO2 31 03/05/2017 0736   BUN 18 03/05/2017 0736   CREATININE 0.75 03/05/2017 0736      Component Value Date/Time   CALCIUM 9.2 03/05/2017 0736   ALKPHOS 51 08/18/2016 1346   AST 29 03/05/2017 0736   ALT 25 03/05/2017 0736   BILITOT 0.7 03/05/2017 0736       ASSESSMENT/PLAN:  Medicare annual wellness visit, subsequent - Plan: Cytology - PAP [place]  Type 2 diabetes mellitus without complication, without long-term current use of insulin ([place]) - well controlled on metformin - Plan: metFORMIN (GLUCOPHAGE-XR) 500 MG 24 hr tablet  Pure hypercholesterolemia - lipids at goal per last check; continue statin  COPD without exacerbation (HCC) - stable per pulmonary  Atypical ductal hyperplasia of breast - had f/u with oncologist; no further treatment needed. Continue SBE, mammo  Trochanteric bursitis of both hips - right resolved s/p cortisone shot. Still tender on left, not bothersome to pt, declines treatment  Aortic valve stenosis, etiology of cardiac valve disease unspecified - asymptomatic; has f/u with cardiology scheduled  Encounter for gynecological examination without abnormal finding - Plan: Cytology - PAP [place]  Abdominal hernia without obstruction and without gangrene, recurrence not specified, unspecified hernia type - f/u with Dr. Marlou Starks as scheduled  Essential hypertension, benign - continue  current meds; regular exercise, weight loss, low sodium diet  encouraged  Aortic atherosclerosis (HCC)  Urothelial cancer (HCC) - f/u cystoscopy scheduled for next month  Type 2 diabetes mellitus without complication, without long-term current use of insulin ([place]) - controlled - Plan: metFORMIN (GLUCOPHAGE-XR) 500 MG 24 hr tablet  Class 3 drug-induced obesity with serious comorbidity and body mass index (BMI) of 40.0 to 44.9 in adult [facility]) - recently restarted Weight Watchers.  Reviewed risks of obesity in detail; already seeing complications with cirrhosis noted on CT  Cirrhosis of liver without ascites, unspecified hepatic cirrhosis type ([place]) - noted on CT 08/2016. Suspect related to fatty liver. Risks of obesity reviewed and weight loss recommended.   Discussed monthly self breast exams and yearly mammograms; at least 30 minutes of aerobic activity at least 5 days/week and weight-bearing exercise 2x/week; proper sunscreen use reviewed; healthy diet, including goals of calcium and vitamin D intake and alcohol recommendations (less than or equal to 1 drink/day) reviewed; regular seatbelt use; changing batteries in smoke detectors.  Immunization recommendations discussed, UTD, continue yearly high dose flu shots. Shingrix recommended when available. Td booster due next year.  Colonoscopy recommendations reviewed, UTD--f/u with Dr. Collene Mares due 12/2022.  MOST form reviewed/updated.  Full Code, Full Care  c-met, lipids, urine microalbumin, A1c in 6 mos   Consider trying coenzyme Q10, along with drinking a lot of water, and possibly the white soap trick to see if these things help with your cramping.  Medicare Attestation I have personally reviewed: The patient's medical and social history Their use of alcohol, tobacco or illicit drugs Their current medications and supplements The patient's functional ability including ADLs,fall risks, home safety risks, cognitive, and hearing and visual impairment Diet and physical activities Evidence for depression or  mood disorders  The patient's weight, height, and BMI have been recorded in the chart.  I have made referrals, counseling, and provided education to the patient based on review of the above and I have provided the patient with a written personalized care plan for preventive services.

## 2017-03-11 ENCOUNTER — Encounter: Payer: Self-pay | Admitting: Family Medicine

## 2017-03-11 ENCOUNTER — Ambulatory Visit (INDEPENDENT_AMBULATORY_CARE_PROVIDER_SITE_OTHER): Payer: Medicare HMO | Admitting: Family Medicine

## 2017-03-11 ENCOUNTER — Other Ambulatory Visit (HOSPITAL_COMMUNITY)
Admission: RE | Admit: 2017-03-11 | Discharge: 2017-03-11 | Disposition: A | Discharge status: Home / Self Care | Payer: Medicare HMO | Source: Ambulatory Visit | Attending: Family Medicine | Admitting: Family Medicine

## 2017-03-11 VITALS — BP 136/72 | HR 76 | Ht 63.75 in | Wt 241.6 lb

## 2017-03-11 DIAGNOSIS — Z1151 Encounter for screening for human papillomavirus (HPV): Secondary | ICD-10-CM | POA: Diagnosis not present

## 2017-03-11 DIAGNOSIS — E661 Drug-induced obesity: Secondary | ICD-10-CM

## 2017-03-11 DIAGNOSIS — I10 Essential (primary) hypertension: Secondary | ICD-10-CM | POA: Insufficient documentation

## 2017-03-11 DIAGNOSIS — K469 Unspecified abdominal hernia without obstruction or gangrene: Secondary | ICD-10-CM | POA: Diagnosis not present

## 2017-03-11 DIAGNOSIS — I700 Atherosclerosis of aorta: Secondary | ICD-10-CM | POA: Insufficient documentation

## 2017-03-11 DIAGNOSIS — M7061 Trochanteric bursitis, right hip: Secondary | ICD-10-CM

## 2017-03-11 DIAGNOSIS — I350 Nonrheumatic aortic (valve) stenosis: Secondary | ICD-10-CM

## 2017-03-11 DIAGNOSIS — Z01419 Encounter for gynecological examination (general) (routine) without abnormal findings: Secondary | ICD-10-CM

## 2017-03-11 DIAGNOSIS — Z0000 Encounter for general adult medical examination without abnormal findings: Secondary | ICD-10-CM

## 2017-03-11 DIAGNOSIS — E7800 Pure hypercholesterolemia, unspecified: Secondary | ICD-10-CM | POA: Insufficient documentation

## 2017-03-11 DIAGNOSIS — N6099 Unspecified benign mammary dysplasia of unspecified breast: Secondary | ICD-10-CM

## 2017-03-11 DIAGNOSIS — Z7722 Contact with and (suspected) exposure to environmental tobacco smoke (acute) (chronic): Secondary | ICD-10-CM | POA: Diagnosis not present

## 2017-03-11 DIAGNOSIS — K7460 Unspecified cirrhosis of liver: Secondary | ICD-10-CM | POA: Diagnosis not present

## 2017-03-11 DIAGNOSIS — C689 Malignant neoplasm of urinary organ, unspecified: Secondary | ICD-10-CM

## 2017-03-11 DIAGNOSIS — Z6841 Body Mass Index (BMI) 40.0 and over, adult: Secondary | ICD-10-CM

## 2017-03-11 DIAGNOSIS — M7062 Trochanteric bursitis, left hip: Secondary | ICD-10-CM

## 2017-03-11 DIAGNOSIS — E119 Type 2 diabetes mellitus without complications: Secondary | ICD-10-CM

## 2017-03-11 DIAGNOSIS — J449 Chronic obstructive pulmonary disease, unspecified: Secondary | ICD-10-CM | POA: Diagnosis not present

## 2017-03-11 DIAGNOSIS — Z9189 Other specified personal risk factors, not elsewhere classified: Secondary | ICD-10-CM | POA: Diagnosis not present

## 2017-03-11 MED ORDER — METFORMIN HCL ER 500 MG PO TB24: 500.0000 mg | ORAL_TABLET | Freq: Three times a day (TID) | ORAL | 1 refills | Status: DC

## 2017-03-11 NOTE — Patient Instructions (Addendum)
HEALTH MAINTENANCE RECOMMENDATIONS:  It is recommended that you get at least 30 minutes of aerobic exercise at least 5 days/week (for weight loss, you may need as much as 60-90 minutes). This can be any activity that gets your heart rate up. This can be divided in 10-15 minute intervals if needed, but try and build up your endurance at least once a week.  Weight bearing exercise is also recommended twice weekly.  Eat a healthy diet with lots of vegetables, fruits and fiber.  "Colorful" foods have a lot of vitamins (ie green vegetables, tomatoes, red peppers, etc).  Limit sweet tea, regular sodas and alcoholic beverages, all of which has a lot of calories and sugar.  Up to 1 alcoholic drink daily may be beneficial for women (unless trying to lose weight, watch sugars).  Drink a lot of water.  Calcium recommendations are 1200-1500 mg daily (1500 mg for postmenopausal women or women without ovaries), and vitamin D 1000 IU daily.  This should be obtained from diet and/or supplements (vitamins), and calcium should not be taken all at once, but in divided doses.  Monthly self breast exams and yearly mammograms for women over the age of 25 is recommended.  Sunscreen of at least SPF 30 should be used on all sun-exposed parts of the skin when outside between the hours of 10 am and 4 pm (not just when at beach or pool, but even with exercise, golf, tennis, and yard work!)  Use a sunscreen that says "broad spectrum" so it covers both UVA and UVB rays, and make sure to reapply every 1-2 hours.  Remember to change the batteries in your smoke detectors when changing your clock times in the spring and fall.  Use your seat belt every time you are in a car, and please drive safely and not be distracted with cell phones and texting while driving.   Shannon Schultz , Thank you for taking time to come for your Medicare Wellness Visit. I appreciate your ongoing commitment to your health goals. Please review the following  plan we discussed and let me know if I can assist you in the future.   These are the goals we discussed: Goals    None      This is a list of the screening recommended for you and due dates:  Health Maintenance  Topic Date Due  . Eye exam for diabetics  08/27/2016  . Complete foot exam   02/13/2017  . Urine Protein Check  08/25/2017  . Hemoglobin A1C  09/02/2017  . Tetanus Vaccine  02/22/2018  . Mammogram  10/25/2018  . Colon Cancer Screening  01/23/2023  . Flu Shot  Completed  .  Hepatitis C: One time screening is recommended by Center for Disease Control  (CDC) for  adults born from [birth year] through 1965.   Completed  . Pneumonia vaccines  Completed   Continue yearly eye exams--we didn't get a copy of this year's report, which is why it is saying above that you are past due. We did your foot exam today. Tetanus is due next year--you will need to get from the pharmacy (not our office).   I recommend getting the new shingles vaccine (Shingrix). You will need to check with your insurance to see if it is covered, and if covered by Medicare Part D, you need to get from the pharmacy rather than our office.  It is a series of 2 injections, spaced 2 months apart. There is a currently a backorder--wait  until you see commercials on TV or it is more readily available to start the series--you need to get both vaccines within a 6 month period.  Consider trying coenzyme Q10, along with drinking a lot of water, and possibly the white soap trick to see if these things help with your cramping.

## 2017-03-16 ENCOUNTER — Telehealth: Payer: Self-pay | Admitting: Pulmonary Disease

## 2017-03-16 LAB — CYTOLOGY - PAP
Diagnosis: NEGATIVE
HPV: NOT DETECTED

## 2017-03-16 NOTE — Telephone Encounter (Signed)
Current Outpatient Prescriptions on File Prior to Visit  Medication Sig Dispense Refill  . acetaminophen (TYLENOL) 500 MG tablet Take 1,000 mg by mouth every 6 (six) hours as needed (for pain/headaches.).    Marland Kitchen albuterol (PROVENTIL) (2.5 MG/3ML) 0.083% nebulizer solution USE 1 VIAL BY NEBULIZATION EVERY FOUR HOURS AS NEEDED FOR WHEEZING OR SHORTNESS OF BREATH. (Patient not taking: Reported on 02/26/2017) 270 mL 0  . bisoprolol-hydrochlorothiazide (ZIAC) 10-6.25 MG tablet Take 1 tablet by mouth daily. 90 tablet 0  . Calcium Carbonate-Vitamin D (CALCIUM 600+D) 600-400 MG-UNIT per tablet Take 1 tablet by mouth daily.     . cetirizine (KLS ALLER-TEC) 10 MG tablet Take 10 mg by mouth daily.    . Cholecalciferol (VITAMIN D) 2000 UNITS tablet Take 4,000 Units by mouth daily.    . fluticasone (FLONASE) 50 MCG/ACT nasal spray Place 2 sprays into both nostrils daily. (Patient taking differently: Place 2 sprays into both nostrils every evening. ) 48 g 3  . Fluticasone-Umeclidin-Vilant (TRELEGY ELLIPTA) 100-62.5-25 MCG/INH AEPB Inhale 1 puff into the lungs daily. 180 each 3  . furosemide (LASIX) 20 MG tablet Take 20 mg by mouth daily as needed (for fluid retention/edema).     . metFORMIN (GLUCOPHAGE-XR) 500 MG 24 hr tablet Take 1 tablet (500 mg total) by mouth 3 (three) times daily. 270 tablet 1  . Multiple Vitamins-Minerals (CENTRUM SILVER PO) Take 1 tablet by mouth daily.     . simvastatin (ZOCOR) 20 MG tablet Take 20 mg by mouth every evening.    . triamcinolone cream (KENALOG) 0.1 % Apply 1 application topically 2 (two) times daily. Applied to affected areas on legs.  1  . TRUEPLUS LANCETS 33G MISC TEST ONE TIME DAILY (Patient taking differently: TEST ONCE TO TWICE DAILY) 100 each 3  . valACYclovir (VALTREX) 500 MG tablet Take 500 mg by mouth daily.     No current facility-administered medications on file prior to visit.    Spoke with pt and advised I would leave Trelegy samples up front. Pt understood and  nothing further is needed.

## 2017-03-24 ENCOUNTER — Other Ambulatory Visit: Payer: Self-pay | Admitting: Family Medicine

## 2017-04-02 ENCOUNTER — Encounter: Payer: Self-pay | Admitting: Family Medicine

## 2017-04-02 ENCOUNTER — Ambulatory Visit (INDEPENDENT_AMBULATORY_CARE_PROVIDER_SITE_OTHER): Payer: Medicare HMO | Admitting: Family Medicine

## 2017-04-02 VITALS — BP 132/80 | HR 76 | Temp 98.5°F | Ht 63.75 in | Wt 240.6 lb

## 2017-04-02 DIAGNOSIS — J449 Chronic obstructive pulmonary disease, unspecified: Secondary | ICD-10-CM | POA: Diagnosis not present

## 2017-04-02 DIAGNOSIS — E119 Type 2 diabetes mellitus without complications: Secondary | ICD-10-CM

## 2017-04-02 MED ORDER — CLARITHROMYCIN 500 MG PO TABS: 500.0000 mg | ORAL_TABLET | Freq: Two times a day (BID) | ORAL | 0 refills | Status: DC

## 2017-04-02 NOTE — Patient Instructions (Addendum)
  Please look at your labels regarding guaifenesin. You shouldn't be taking more than '2400mg'$  per day.  If your mucinex is '1200mg'$  each tablet, then you shouldn't use the robitussin syrup at bedtime (if that also contains guaifenesin).  You could instead change to a Delsym syrup, which only contains dextromethorphan (no guaifenesin), and take that up to twice daily, if needed for cough (doesn't just need to be at bedtime, since you are coughing a lot during the day.  Start the antibiotic--take it as directed.  Let us know if you have side effects or if you are getting worse rather than better.  Continue to drink plenty of water. Continue the mucinex twice daily. I recommend trying the Delsym syrup up to twice daily. If this isn't effective in treating your cough, the other prescription cough syrups that are available contain either codeine or hydrocodone, both of which may make you nauseated.  Therefore, the other option to help treat cough (unless you're willing to feel sick from the syrup) would be a course of prednisone. This would raise your sugars and have some other side effects, so not my first choice, but my second.  I also suggest trying sinus rinses (sinus rinse kit or Neti-pot) once or twice daily, to help with your head congestion.  Feel free to use tylenol if needed for pain.  Let us know if you need some prednisone sent in if your cough isn't improving.   Don't take your simvastatin while taking the clarithromycin.  Restart it when you finish the antibiotic.
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
## 2017-04-03 ENCOUNTER — Encounter: Payer: Self-pay | Admitting: Family Medicine

## 2017-04-03 DIAGNOSIS — K439 Ventral hernia without obstruction or gangrene: Secondary | ICD-10-CM | POA: Diagnosis not present

## 2017-04-04 ENCOUNTER — Encounter: Payer: Self-pay | Admitting: Family Medicine

## 2017-04-04 DIAGNOSIS — J441 Chronic obstructive pulmonary disease with (acute) exacerbation: Secondary | ICD-10-CM | POA: Diagnosis not present

## 2017-04-04 DIAGNOSIS — J449 Chronic obstructive pulmonary disease, unspecified: Secondary | ICD-10-CM | POA: Diagnosis not present

## 2017-04-09 ENCOUNTER — Ambulatory Visit: Payer: Self-pay | Admitting: General Surgery

## 2017-04-17 ENCOUNTER — Telehealth: Payer: Self-pay | Admitting: Internal Medicine

## 2017-04-17 ENCOUNTER — Telehealth: Payer: Self-pay | Admitting: Family Medicine

## 2017-04-17 MED ORDER — PREDNISONE 20 MG PO TABS: 40.0000 mg | ORAL_TABLET | Freq: Every day | ORAL | 0 refills | Status: DC

## 2017-04-17 NOTE — Telephone Encounter (Signed)
She had one in 12/2014 and it was normal.  Unless her oncologist recommends repeat testing at some point in the next few years, I don't think she needs one now.

## 2017-04-17 NOTE — Telephone Encounter (Signed)
Pt was notified of results

## 2017-04-17 NOTE — Telephone Encounter (Signed)
Notified the pt , she said that she receive a letter from [place] that she need to have one. Pt is waiting until she is  due to this done.

## 2017-04-17 NOTE — Telephone Encounter (Signed)
Pt called stating that [facility] does not have her rx. We show that it was sent in Spoke to pharmacist, he has no record of it , gave verbal rx to pharmacist For prednisone mg

## 2017-04-17 NOTE — Telephone Encounter (Signed)
Pt called and states that she thinks it is time for her to have a bone density scan she would need a referral for it so it you could please set her up for that and look to see if she needs one, she was informed that you was not in the office today, pt can be reached at [phone]

## 2017-04-17 NOTE — Telephone Encounter (Signed)
Pt called and states that she is feeling better but still has the cough and mentioned about getting around of prednisone to take. She needs to get rid of this cough due to having surgery in November. Pharmacy is [facility] [address]

## 2017-04-17 NOTE — Telephone Encounter (Signed)
5 d course of prednisone sent to her pharmacy. She should expect higher sugars while on the steroids.

## 2017-04-22 ENCOUNTER — Ambulatory Visit (INDEPENDENT_AMBULATORY_CARE_PROVIDER_SITE_OTHER): Payer: Medicare HMO | Admitting: Family Medicine

## 2017-04-22 ENCOUNTER — Ambulatory Visit
Admission: RE | Admit: 2017-04-22 | Discharge: 2017-04-22 | Disposition: A | Discharge status: Home / Self Care | Payer: Medicare HMO | Source: Ambulatory Visit | Attending: Family Medicine | Admitting: Family Medicine

## 2017-04-22 ENCOUNTER — Encounter: Payer: Self-pay | Admitting: Family Medicine

## 2017-04-22 ENCOUNTER — Other Ambulatory Visit: Payer: Self-pay | Admitting: Family Medicine

## 2017-04-22 VITALS — BP 140/80 | HR 68 | Temp 99.9°F | Ht 63.75 in | Wt 244.6 lb

## 2017-04-22 DIAGNOSIS — R053 Chronic cough: Secondary | ICD-10-CM

## 2017-04-22 DIAGNOSIS — R05 Cough: Secondary | ICD-10-CM | POA: Diagnosis not present

## 2017-04-22 DIAGNOSIS — E119 Type 2 diabetes mellitus without complications: Secondary | ICD-10-CM | POA: Diagnosis not present

## 2017-04-22 DIAGNOSIS — J449 Chronic obstructive pulmonary disease, unspecified: Secondary | ICD-10-CM | POA: Diagnosis not present

## 2017-04-22 MED ORDER — DOXYCYCLINE HYCLATE 100 MG PO TABS: 100.0000 mg | ORAL_TABLET | Freq: Two times a day (BID) | ORAL | 0 refills | Status: DC

## 2017-04-22 NOTE — Progress Notes (Signed)
Chief Complaint  Patient presents with  . Cough    still not better, took last two prednisone tablets this morning. Not any better at all and has hernia surgery scheduled for 05/15/17.   Presents with complaint of persistent cough.  She feels like there is a "growling" in her throat intermittently, and then she starts coughing spasm.  Sometimes gets up a little clear phlegm, mostly is nonproductive.  Not aware of any fever, chills, denies body aches.  She has some runny nose/allergies this time of year.  Denies any significant postnasal drainage. Only slight tightness in her chest.  She tried albuterol but it didn't seem to help with the cough (via nebulizer).  She was given prednisone 40mg  x 5 days, took last dose this morning.  She hasn't noticed any improvement with this. Sugar went up to 200, was 189 this morning.  Later in the day is 115- low 120's.  Denies heartburn of indigestion.  No coughing at night that she is aware of (husband doesn't notice either).  She does cough before falling asleep, and does report some shortness of breath laying flat.  She has not been using mucinex.  She is scheduled for hernia repair on 11/16, needs to get her cough better before surgery.  PMH, PSH, SH reviewed  Outpatient Encounter Prescriptions as of 04/22/2017  Medication Sig Note  . bisoprolol-hydrochlorothiazide (ZIAC) 10-6.25 MG tablet TAKE 1 TABLET EVERY DAY   . Calcium Carbonate-Vitamin D (CALCIUM 600+D) 600-400 MG-UNIT per tablet Take 1 tablet by mouth daily.  02/26/2017: taking  . cetirizine (KLS ALLER-TEC) 10 MG tablet Take 10 mg by mouth daily.   . Cholecalciferol (VITAMIN D) 2000 UNITS tablet Take 4,000 Units by mouth daily. 02/26/2017: taking  . fluticasone (FLONASE) 50 MCG/ACT nasal spray Place 2 sprays into both nostrils daily. (Patient taking differently: Place 2 sprays into both nostrils every evening. )   . Fluticasone-Umeclidin-Vilant (TRELEGY ELLIPTA) 100-62.5-25 MCG/INH AEPB Inhale 1  puff into the lungs daily.   . Multiple Vitamins-Minerals (CENTRUM SILVER PO) Take 1 tablet by mouth daily.  02/26/2017: taking  . predniSONE (DELTASONE) 20 MG tablet Take 2 tablets (40 mg total) by mouth daily with breakfast.   . simvastatin (ZOCOR) 20 MG tablet Take 20 mg by mouth every evening.   . TRUEPLUS LANCETS 33G MISC TEST ONE TIME DAILY (Patient taking differently: TEST ONCE TO TWICE DAILY)   . valACYclovir (VALTREX) 500 MG tablet Take 500 mg by mouth daily. 04/22/2017: Every other day  . [DISCONTINUED] metFORMIN (GLUCOPHAGE-XR) 500 MG 24 hr tablet Take 1 tablet (500 mg total) by mouth 3 (three) times daily.   Marland Kitchen acetaminophen (TYLENOL) 500 MG tablet Take 1,000 mg by mouth every 6 (six) hours as needed (for pain/headaches.).   Marland Kitchen albuterol (PROVENTIL) (2.5 MG/3ML) 0.083% nebulizer solution USE 1 VIAL BY NEBULIZATION EVERY FOUR HOURS AS NEEDED FOR WHEEZING OR SHORTNESS OF BREATH. (Patient not taking: Reported on 02/26/2017)   . doxycycline (VIBRA-TABS) 100 MG tablet Take 1 tablet (100 mg total) by mouth 2 (two) times daily.   . furosemide (LASIX) 20 MG tablet Take 20 mg by mouth daily as needed (for fluid retention/edema).  03/11/2017: Hasn't needed any in the last month  . guaiFENesin (MUCINEX) 600 MG 12 hr tablet Take 600 mg by mouth 2 (two) times daily.   Marland Kitchen triamcinolone cream (KENALOG) 0.1 % Apply 1 application topically 2 (two) times daily. Applied to affected areas on legs.   . [DISCONTINUED] clarithromycin (BIAXIN) 500 MG tablet  Take 1 tablet (500 mg total) by mouth 2 (two) times daily.    No facility-administered encounter medications on file as of 04/22/2017.    (doxy just rx'd today, not taking prior to visit).  Allergies  Allergen Reactions  . Contrast Media [Iodinated Diagnostic Agents] Anaphylaxis and Shortness Of Breath    Could not breath  . Iohexol Anaphylaxis and Shortness Of Breath    Immediately could not breathe  . Lisinopril Anaphylaxis, Shortness Of Breath and Rash   . Sulfa Antibiotics Anaphylaxis and Other (See Comments)    Historical from mother, pt states that mother says she almost died from this drug  . Latex Other (See Comments)    blisters  . Codeine Nausea Only, Anxiety and Other (See Comments)    insomnia  . Levofloxacin Other (See Comments)    insomnia  . Lipitor [Atorvastatin Calcium] Rash   She reports that z-paks usually aren't effective  PHYSICAL EXAM:  BP 140/80   Pulse 68   Temp 99.9 F (37.7 C) (Tympanic)   Ht 5' 3.75" (1.619 m)   Wt 244 lb 9.6 oz (110.9 kg)   LMP  (LMP Unknown)   SpO2 96%   BMI 42.32 kg/m    Wt Readings from Last 3 Encounters:  04/22/17 244 lb 9.6 oz (110.9 kg)  04/02/17 240 lb 9.6 oz (109.1 kg)  03/11/17 241 lb 9.6 oz (109.6 kg)    Well appearing female, speaking easily in full sentences, in no distress.  Periodic cough during visit, slightly wet-sounding, sometimes dry sounding HEENT: PERRL, EOMI, conjunctiva and sclera are clear. TM's and EAC's normal. Nasal mucosa is normal. OP is clear.  Sinuses are nontender Neck: no lymphadenopathy or mass Heart: regular rate and rhythm with 3/6 SEM loudest at RUSB Lungs: fair air movement, no wheezes, rales, ronchi Abdomen: soft, nontender.  Large hernia, nontender Extremities: trace to 1+ pretibial edema Skin: no rash.  Venous stasis changes bilaterally Psych: normal mood, affect, hygiene and grooming Neuro: alert and oriented, normal cranial nerves, gait   ASSESSMENT/PLAN:  Persistent cough - in pt with COPD, edema, heart murmur, not responsive to prednisone. Check CXR. Clinically no CHF or sx of reflux - Plan: DG Chest 2 View  Bronchitis with airway obstruction (HCC) - CXR negative--treat for bronchitis with doxy - Plan: doxycycline (VIBRA-TABS) 100 MG tablet  Type 2 diabetes mellitus without complication, without long-term current use of insulin (HCC) - controlled--did not increase as high as expected with prednisone course     Go to  [place] for chest x-ray (either [address]) This is to look for any evidence of infection or fluid overload. We will contact you with your results through [place] later today.  Restart Mucinex--either plain or DM.  If you are taking the plain, you can take a Delsym syrup (dextromethorphan is the active ingredient that is a coughs suppressant). I likely will start an antibiotic after reviewing the xray, given the persistent cough and low grade fever noted at your visit. I want to have all the information before I write the prescription, to make a good antibiotic choice.   Addendum--CXR negative. rx doxy as discussed at visit. Risks/side effects reviewed at visit.

## 2017-04-22 NOTE — Patient Instructions (Signed)
  Go to [facility] for chest x-ray (either [address]) This is to look for any evidence of infection or fluid overload. We will contact you with your results through [place] later today.  Restart Mucinex--either plain or DM.  If you are taking the plain, you can take a Delsym syrup (dextromethorphan is the active ingredient that is a coughs suppressant). I likely will start an antibiotic after reviewing the xray, given the persistent cough and low grade fever noted at your visit. I want to have all the information before I write the prescription, to make a good antibiotic choice.

## 2017-04-23 ENCOUNTER — Ambulatory Visit: Payer: Medicare HMO | Admitting: Cardiology

## 2017-04-23 ENCOUNTER — Telehealth: Payer: Self-pay | Admitting: Family Medicine

## 2017-04-23 DIAGNOSIS — C678 Malignant neoplasm of overlapping sites of bladder: Secondary | ICD-10-CM | POA: Diagnosis not present

## 2017-04-23 HISTORY — PX: CYSTOSCOPY: SUR368

## 2017-04-23 NOTE — Telephone Encounter (Signed)
Pt came in and dropped off advanced directives. Sending back to be reviewed.

## 2017-04-30 ENCOUNTER — Encounter: Payer: Self-pay | Admitting: Cardiology

## 2017-04-30 ENCOUNTER — Ambulatory Visit (INDEPENDENT_AMBULATORY_CARE_PROVIDER_SITE_OTHER): Payer: Medicare HMO | Admitting: Cardiology

## 2017-04-30 ENCOUNTER — Encounter: Payer: Self-pay | Admitting: *Deleted

## 2017-04-30 VITALS — BP 120/62 | HR 74 | Ht 64.5 in | Wt 243.4 lb

## 2017-04-30 DIAGNOSIS — I350 Nonrheumatic aortic (valve) stenosis: Secondary | ICD-10-CM

## 2017-04-30 DIAGNOSIS — E7800 Pure hypercholesterolemia, unspecified: Secondary | ICD-10-CM

## 2017-04-30 DIAGNOSIS — I10 Essential (primary) hypertension: Secondary | ICD-10-CM

## 2017-04-30 NOTE — Patient Instructions (Signed)

## 2017-04-30 NOTE — Progress Notes (Signed)
Cardiology Office Note   Date:  05/22/17   ID:  Shannon Schultz, DOB 1946-03-25, MRN [id number]  PCP:  [name], MD  Cardiologist:   Candee Furbish, MD       History of Present Illness: Shannon Schultz is a 71 y.o. female who presents for follow up of dyspnea on exertion. Has moderate aortic stenosis. She has been diagnosed with COPD, quit smoking in 2012, obesity, diabetes, hypertension. She did not think that Symbicort helped with her symptoms of dyspnea. She will use samples. She will be short of breath when walking to the mailbox, 50 feet, going up stairs, carrying laundry.  PFTs from 10/05/14 conclusion was severe airway obstruction improved with bronchodilation. FEV1-52%.  Both parents had heart disease. Father died 62 MI, Mother died 42, tachycardia.   05/22/17-overall she is doing well with no significant changes in shortness of breath.  She does have chronic bilateral lower extremity edema, hip bursitis.  Moderate/mild to moderate aortic stenosis previously diagnosed.  She is about to undergo on 05/15/17 a large ventral hernia repair.  Dr. Marlou Starks.  Intermediate risk surgery.  Prior nuclear stress test was reassuring.  Past Medical History:  Diagnosis Date  . Allergy   . Aortic stenosis    mild-mod by 04/08/16 echo  . Arthritis   . Cancer ([facility]) 1992   R breast, DCIS,bladder ca (just dx)  . Colon polyp   . Complication of anesthesia    92    "local anesthesia" used was hard to awaken from-no problems since  . COPD (chronic obstructive pulmonary disease) ([facility])   . Diverticulosis   . Dyspnea   . Elevated cholesterol   . Elevated liver enzymes    fatty liver per ultrasound per pt  . Family history of malignant neoplasm of breast   . FHx: BRCA2 gene positive    sister with BRCA2 mutation (pt tested NEGATIVE)  . Genital HSV    gets on hip  . Heart murmur     echo 05/2011 mild aortic stenosis  . HSV (herpes simplex virus) infection    on hip--on daily suppression  .  Hypertension   . Hypothyroidism    took med 7 yrs after birth of 1st child  . Impaired glucose tolerance   . Migraine   . Osteopenia   . Pneumonia 06/06/2011  . Type 2 diabetes mellitus (HCC)    type 2  . Vitamin D deficiency disease     Past Surgical History:  Procedure Laterality Date  . bladder tumor rescestion      Dr. Alinda Money 01/12/17  . BREAST LUMPECTOMY WITH RADIOACTIVE SEED LOCALIZATION Left 12/22/2016   Procedure: LEFT BREAST LUMPECTOMY WITH RADIOACTIVE SEED LOCALIZATION;  Surgeon: Jovita Kussmaul, MD;  Location: [facility];  Service: General;  Laterality: Left;  . CHOLECYSTECTOMY  2003  . COLONOSCOPY  2009, 08/2010, 12/2015   Dr. Collene Mares  . CYSTOSCOPY W/ RETROGRADES Bilateral 01/12/2017   Procedure: CYSTOSCOPY WITH RETROGRADE PYELOGRAM/ EXAM UNDER ANESTHESIA;  Surgeon: Raynelle Bring, MD;  Location: [facility];  Service: Urology;  Laterality: Bilateral;  . HERNIA REPAIR  1505   umbilical  . MASTECTOMY Right 1992   R breast for cancer  . TRANSURETHRAL RESECTION OF BLADDER TUMOR WITH MITOMYCIN-C N/A 01/12/2017   Procedure: TRANSURETHRAL RESECTION OF BLADDER TUMOR WITH POSSIBLE POST OPERATIVE INSTILLATION OF MITOMYCIN-C;  Surgeon: Raynelle Bring, MD;  Location: [facility];  Service: Urology;  Laterality: N/A;     Current Outpatient Prescriptions  Medication Sig  Dispense Refill  . acetaminophen (TYLENOL) 500 MG tablet Take 1,000 mg by mouth every 6 (six) hours as needed (for pain/headaches.).    Marland Kitchen albuterol (PROVENTIL) (2.5 MG/3ML) 0.083% nebulizer solution USE 1 VIAL BY NEBULIZATION EVERY FOUR HOURS AS NEEDED FOR WHEEZING OR SHORTNESS OF BREATH. 270 mL 0  . bisoprolol-hydrochlorothiazide (ZIAC) 10-6.25 MG tablet TAKE 1 TABLET EVERY DAY 90 tablet 0  . Calcium Carbonate-Vitamin D (CALCIUM 600+D) 600-400 MG-UNIT per tablet Take 1 tablet by mouth daily.     . cetirizine (KLS ALLER-TEC) 10 MG tablet Take 10 mg by mouth daily.    . Cholecalciferol (VITAMIN D) 2000 UNITS tablet Take 4,000 Units by  mouth daily.    Marland Kitchen doxycycline (VIBRA-TABS) 100 MG tablet Take 1 tablet (100 mg total) by mouth 2 (two) times daily. 20 tablet 0  . fluticasone (FLONASE) 50 MCG/ACT nasal spray Place 2 sprays into both nostrils daily. (Patient taking differently: Place 2 sprays into both nostrils every evening. ) 48 g 3  . Fluticasone-Umeclidin-Vilant (TRELEGY ELLIPTA) 100-62.5-25 MCG/INH AEPB Inhale 1 puff into the lungs daily. 180 each 3  . furosemide (LASIX) 20 MG tablet Take 20 mg by mouth daily as needed (for fluid retention/edema).     Marland Kitchen guaiFENesin (MUCINEX) 600 MG 12 hr tablet Take 600 mg by mouth 2 (two) times daily.    . metFORMIN (GLUCOPHAGE-XR) 500 MG 24 hr tablet TAKE 1 TABLET THREE TIMES DAILY 270 tablet 1  . Multiple Vitamins-Minerals (CENTRUM SILVER PO) Take 1 tablet by mouth daily.     . simvastatin (ZOCOR) 20 MG tablet Take 20 mg by mouth every evening.    . TRUEPLUS LANCETS 33G MISC TEST ONE TIME DAILY (Patient taking differently: TEST ONCE TO TWICE DAILY) 100 each 3  . valACYclovir (VALTREX) 500 MG tablet Take 500 mg by mouth daily.     No current facility-administered medications for this visit.     Allergies:   Contrast media [iodinated diagnostic agents]; Iohexol; Lisinopril; Sulfa antibiotics; Latex; Codeine; Levofloxacin; and Lipitor [atorvastatin calcium]    Social History:  The patient  reports that she quit smoking about 5 years ago. Her smoking use included Cigarettes. She has a 46.00 pack-year smoking history. She has never used smokeless tobacco. She reports that she does not drink alcohol or use drugs.   Family History:  The patient's family history includes Allergies in her sister and sister; Asthma in her sister and sister; Breast cancer (age of onset: 65) in her sister; Cancer in her maternal grandmother and other; Diabetes in her father; Fibromyalgia in her sister and sister; Hashimoto's thyroiditis in her sister and sister; Heart disease in her father and mother; Hyperlipidemia  in her sister and sister; Hypertension in her father; Stroke (age of onset: 70) in her paternal grandmother; Tuberculosis in her maternal grandfather.    ROS:  Please see the history of present illness.   Otherwise, review of systems are positive for occasional palpitations/fluttering sensation in her throat that last less than 2 seconds, sometimes catches her breath. Shortness of breath with activity, skipping heartbeats, snoring, hearing loss.   All other systems are reviewed and negative.    PHYSICAL EXAM: VS:  BP 120/62   Pulse 74   Ht 5' 4.5" (1.638 m)   Wt 243 lb 6.4 oz (110.4 kg)   LMP  (LMP Unknown)   SpO2 97%   BMI 41.13 kg/m  , BMI Body mass index is 41.13 kg/m.   GEN: Well nourished, well developed, in  no acute distress  HEENT: normal  Neck: no JVD, carotid bruits, or masses Cardiac: RRR; 2/6 SM, no rubs, or gallops,no edema  Respiratory:  clear to auscultation bilaterally, normal work of breathing GI: soft, nontender, nondistended, + BS, obese MS: no deformity or atrophy  Skin: warm and dry, hyperpigmentation lower extremities. Neuro:  Alert and Oriented x 3, Strength and sensation are intact Psych: euthymic mood, full affect   EKG:  EKG is not ordered today.  01/10/15 shows sinus rhythm with poor R-wave progression  Recent Labs: 01/08/2017: Hemoglobin 12.6; Platelets 125 02/26/2017: Magnesium 1.9 03/05/2017: ALT 25; BUN 18; Creat 0.75; Potassium 4.1; Sodium 139; TSH 2.29    Lipid Panel    Component Value Date/Time   CHOL 113 08/18/2016 1346   TRIG 93 08/18/2016 1346   HDL 51 08/18/2016 1346   CHOLHDL 2.2 08/18/2016 1346   VLDL 19 08/18/2016 1346   LDLCALC 43 08/18/2016 1346      Wt Readings from Last 3 Encounters:  04/30/17 243 lb 6.4 oz (110.4 kg)  04/22/17 244 lb 9.6 oz (110.9 kg)  04/02/17 240 lb 9.6 oz (109.1 kg)    Echocardiogram 03/21/15:  - Left ventricle: The cavity size was normal. Wall thickness was   increased in a pattern of mild LVH.  Systolic function was normal.   The estimated ejection fraction was in the range of 60% to 65%.   Wall motion was normal; there were no regional wall motion   abnormalities. Doppler parameters are consistent with abnormal   left ventricular relaxation (grade 1 diastolic dysfunction). The   E/e&' ratio is between 8-15, suggesting indeterminate LV filling   pressure. - Aortic valve: Moderately calcified leaflets. Trileaflet. Moderate   aortic stenosis. Mean gradient (S): 17 mm Hg. Peak gradient (S):   33 mm Hg. Valve area (VTI): 1.23 cm^2. - Mitral valve: Calcified annulus. Mildly thickened leaflets .   There was mild regurgitation. - Left atrium: The atrium was normal in size. - Right atrium: The atrium was mildly dilated. - Inferior vena cava: The vessel was normal in size. The   respirophasic diameter changes were in the normal range (= 50%),   consistent with normal central venous pressure.  Impressions:  - Compared to a prior echo in 2012 - there is now moderate aortic   stenosis. The AVA is around 1.2 cm2  NUC stress 03/22/15:   Nuclear stress EF: 71%.   There was no ST segment deviation noted during stress.   The study is normal. No evidence of ischemia. No evidence of infarction .   This is a low risk study.   Other studies Reviewed: Additional studies/ records that were reviewed today include: Prior office records, PFTs, echocardiogram from 2012, Dr. Melvyn Novas consultation 2013. Review of the above records demonstrates: as above   ASSESSMENT AND PLAN:  1.  Dyspnea on exertion- Both her parents had heart disease. She is concerned about the possibility given her extensive prior smoking history. She also understands that her morbid obesity may playing a role as well. She has been diagnosed as well with COPD however her Symbicort has not really altered her symptoms. Pulmonary function tests showed an FEV1 of 52% which responded well to bronchodilation. NUC reassuring. ECHO  with normal EF. She is feeling stable overall.  2. Morbid obesity-continue to encourage weight loss.  No changes.  3. COPD-she is on long-term inhaler. She is on bisoprolol which should be less bronco spastic. Hospitalized 08/18/15 with Flu A. Demand ischemia  noted (0.6).  Continue to encourage use of inhalers.  4. Hyperlipidemia-continue with simvastatin. Excellent.  5. Aortic stenosis-mild in 2012 now mild to moderate. This is the murmur we are hearing on exam with radiation to the carotids. Should not be of clinical consequence at this time. Monitor. We will check an echocardiogram repeat.  6.  Preoperative risk stratification-she may proceed with ventral hernia repair with low to intermediate overall risk from a cardiovascular perspective.  Aortic stenosis is mild to moderate.  We are repeating an echocardiogram.  Prior nuclear stress test is also reassuring.  Normal EF.  Current medicines are reviewed at length with the patient today.  The patient does not have concerns regarding medicines.  The following changes have been made:  no change  Labs/ tests ordered today include:   Orders Placed This Encounter  Procedures  . ECHOCARDIOGRAM COMPLETE     Signed, Candee Furbish, MD  04/30/2017 11:53 AM    [facility] [place], [place], [place]  [address] Phone: [phone]; Fax: [phone]

## 2017-05-04 ENCOUNTER — Ambulatory Visit (HOSPITAL_COMMUNITY): Payer: Medicare HMO | Attending: Cardiology

## 2017-05-04 ENCOUNTER — Other Ambulatory Visit: Payer: Self-pay

## 2017-05-04 DIAGNOSIS — R011 Cardiac murmur, unspecified: Secondary | ICD-10-CM | POA: Diagnosis not present

## 2017-05-04 DIAGNOSIS — E119 Type 2 diabetes mellitus without complications: Secondary | ICD-10-CM | POA: Diagnosis not present

## 2017-05-04 DIAGNOSIS — J449 Chronic obstructive pulmonary disease, unspecified: Secondary | ICD-10-CM | POA: Diagnosis not present

## 2017-05-04 DIAGNOSIS — I350 Nonrheumatic aortic (valve) stenosis: Secondary | ICD-10-CM | POA: Diagnosis not present

## 2017-05-04 DIAGNOSIS — I10 Essential (primary) hypertension: Secondary | ICD-10-CM | POA: Insufficient documentation

## 2017-05-05 DIAGNOSIS — J449 Chronic obstructive pulmonary disease, unspecified: Secondary | ICD-10-CM | POA: Diagnosis not present

## 2017-05-05 DIAGNOSIS — J441 Chronic obstructive pulmonary disease with (acute) exacerbation: Secondary | ICD-10-CM | POA: Diagnosis not present

## 2017-05-11 DIAGNOSIS — S46811A Strain of other muscles, fascia and tendons at shoulder and upper arm level, right arm, initial encounter: Secondary | ICD-10-CM | POA: Diagnosis not present

## 2017-05-11 NOTE — Pre-Procedure Instructions (Addendum)
Shannon Schultz  05/11/2017      [id number] [address], [place] [address] Phone: [phone] Fax: [phone]  [facility] - [address] [place] [address] Phone: [phone] Fax: [phone]    Your procedure is scheduled on Friday, May 15, 2017  Report to [facility] Admitting Entrance "A" at 5:30A.M.   Call this number if you have problems the morning of surgery:  [phone]   Remember:  Do not eat food or drink liquids after midnight.  Take these medicines the morning of surgery with A SIP OF WATER: ValACYclovir (VALTREX), Cetirizine (KLS ALLER-TEC), and Fluticasone-Umeclidin-Vilant (TRELEGY ELLIPTA). If needed Acetaminophen (TYLENOL) for pain, Albuterol (PROVENTIL) nebulizer for wheezing or shortness of breath, and Albuterol Inhaler for cough or wheezing (Bring with you the day of surgery).  Follow your doctor's instruction regarding Aspirin.  As of today, stop taking all Aspirin products, Vitamins, Fish oils, and Herbal medications. Also stop all NSAIDS i.e. Advil, Ibuprofen, Motrin, Aleve, Anaprox, Naproxen, BC and Goody Powders.   Please complete your PRE-SURGERY WATER that was given to you at your preadmission appointment by 3:30am on the [facility]  How to Manage Your Diabetes Before and After Surgery  Why is it important to control my blood sugar before and after surgery? . Improving blood sugar levels before and after surgery helps healing and can limit problems. . A way of improving blood sugar control is eating a healthy diet by: o  Eating less sugar and carbohydrates o  Increasing activity/exercise o  Talking with your doctor about reaching your blood sugar goals . High blood sugars (greater than 180 mg/dL) can raise your risk of infections and slow your recovery, so you will need to focus on controlling your  diabetes during the weeks before surgery. . Make sure that the doctor who takes care of your diabetes knows about your planned surgery including the date and location.  How do I manage my blood sugar before surgery? . Check your blood sugar at least 4 times a day, starting 2 days before surgery, to make sure that the level is not too high or low. o Check your blood sugar the morning of your surgery when you wake up and every 2 hours until you get to the Short Stay unit. . If your blood sugar is less than 70 mg/dL, you will need to treat for low blood sugar: o Do not take insulin. o Treat a low blood sugar (less than 70 mg/dL) with  cup of clear juice (cranberry or apple), 4 glucose tablets, OR glucose gel. Recheck blood sugar in 15 minutes after treatment (to make sure it is greater than 70 mg/dL). If your blood sugar is not greater than 70 mg/dL on recheck, call [phone] o  for further instructions. . Report your blood sugar to the short stay nurse when you get to Short Stay.  . If you are admitted to the hospital after surgery: o Your blood sugar will be checked by the staff and you will probably be given insulin after surgery (instead of oral diabetes medicines) to make sure you have good blood sugar levels. o The goal for blood sugar control after surgery is 80-180 mg/dL.  WHAT DO I DO ABOUT MY DIABETES MEDICATION?  Marland Kitchen Do not take MetFORMIN (GLUCOPHAGE-XR) the morning of surgery.  . If your CBG is greater than  220 mg/dL, call us at [phone]   Do not wear jewelry, make-up or nail polish.  Do not wear lotions, powders, perfumes, or deodorant.  Do not shave 48 hours prior to surgery.    Do not bring valuables to the hospital.  [facility] is not responsible for any belongings or valuables.  Contacts, dentures or bridgework may not be worn into surgery.  Leave your suitcase in the car.  After surgery it may be brought to your room.  For patients admitted to the hospital,  discharge time will be determined by your treatment team.  Patients discharged the day of surgery will not be allowed to drive home.   Special instructions:  [facility]- Preparing For Surgery  Before surgery, you can play an important role. Because skin is not sterile, your skin needs to be as free of germs as possible. You can reduce the number of germs on your skin by washing with CHG (chlorahexidine gluconate) Soap before surgery.  CHG is an antiseptic cleaner which kills germs and bonds with the skin to continue killing germs even after washing.  Please do not use if you have an allergy to CHG or antibacterial soaps. If your skin becomes reddened/irritated stop using the CHG.  Do not shave (including legs and underarms) for at least 48 hours prior to first CHG shower. It is OK to shave your face.  Please follow these instructions carefully.   1. Shower the NIGHT BEFORE SURGERY and the MORNING OF SURGERY with CHG.   2. If you chose to wash your hair, wash your hair first as usual with your normal shampoo.  3. After you shampoo, rinse your hair and body thoroughly to remove the shampoo.  4. Use CHG as you would any other liquid soap. You can apply CHG directly to the skin and wash gently with a scrungie or a clean washcloth.   5. Apply the CHG Soap to your body ONLY FROM THE NECK DOWN.  Do not use on open wounds or open sores. Avoid contact with your eyes, ears, mouth and genitals (private parts). Wash Face and genitals (private parts)  with your normal soap.  6. Wash thoroughly, paying special attention to the area where your surgery will be performed.  7. Thoroughly rinse your body with warm water from the neck down.  8. DO NOT shower/wash with your normal soap after using and rinsing off the CHG Soap.  9. Pat yourself dry with a CLEAN TOWEL.  10. Wear CLEAN PAJAMAS to bed the night before surgery, wear comfortable clothes the morning of surgery  11. Place CLEAN SHEETS on your  bed the night of your first shower and DO NOT SLEEP WITH PETS.  Day of Surgery: Do not apply any deodorants/lotions. Please wear clean clothes to the hospital/surgery center.    Please read over the following fact sheets that you were given. Pain Booklet, Coughing and Deep Breathing and Surgical Site Infection Prevention

## 2017-05-12 ENCOUNTER — Other Ambulatory Visit: Payer: Self-pay

## 2017-05-12 ENCOUNTER — Encounter (HOSPITAL_COMMUNITY)
Admission: RE | Admit: 2017-05-12 | Discharge: 2017-05-12 | Disposition: A | Discharge status: Home / Self Care | Payer: Medicare Other | Source: Ambulatory Visit | Attending: General Surgery | Admitting: General Surgery

## 2017-05-12 ENCOUNTER — Encounter (HOSPITAL_COMMUNITY): Payer: Self-pay

## 2017-05-12 DIAGNOSIS — Z888 Allergy status to other drugs, medicaments and biological substances status: Secondary | ICD-10-CM | POA: Diagnosis not present

## 2017-05-12 DIAGNOSIS — Z882 Allergy status to sulfonamides status: Secondary | ICD-10-CM

## 2017-05-12 DIAGNOSIS — E119 Type 2 diabetes mellitus without complications: Secondary | ICD-10-CM | POA: Diagnosis not present

## 2017-05-12 DIAGNOSIS — Z7982 Long term (current) use of aspirin: Secondary | ICD-10-CM

## 2017-05-12 DIAGNOSIS — Z885 Allergy status to narcotic agent status: Secondary | ICD-10-CM | POA: Diagnosis not present

## 2017-05-12 DIAGNOSIS — Z7951 Long term (current) use of inhaled steroids: Secondary | ICD-10-CM | POA: Diagnosis not present

## 2017-05-12 DIAGNOSIS — Z79899 Other long term (current) drug therapy: Secondary | ICD-10-CM

## 2017-05-12 DIAGNOSIS — Z7984 Long term (current) use of oral hypoglycemic drugs: Secondary | ICD-10-CM | POA: Diagnosis not present

## 2017-05-12 DIAGNOSIS — Z881 Allergy status to other antibiotic agents status: Secondary | ICD-10-CM

## 2017-05-12 DIAGNOSIS — Z91041 Radiographic dye allergy status: Secondary | ICD-10-CM

## 2017-05-12 DIAGNOSIS — Z87891 Personal history of nicotine dependence: Secondary | ICD-10-CM | POA: Diagnosis not present

## 2017-05-12 DIAGNOSIS — I10 Essential (primary) hypertension: Secondary | ICD-10-CM | POA: Diagnosis not present

## 2017-05-12 DIAGNOSIS — I2510 Atherosclerotic heart disease of native coronary artery without angina pectoris: Secondary | ICD-10-CM | POA: Diagnosis present

## 2017-05-12 DIAGNOSIS — Z6841 Body Mass Index (BMI) 40.0 and over, adult: Secondary | ICD-10-CM

## 2017-05-12 DIAGNOSIS — J449 Chronic obstructive pulmonary disease, unspecified: Secondary | ICD-10-CM | POA: Diagnosis present

## 2017-05-12 DIAGNOSIS — K439 Ventral hernia without obstruction or gangrene: Secondary | ICD-10-CM | POA: Diagnosis not present

## 2017-05-12 HISTORY — DX: Ventral hernia without obstruction or gangrene: K43.9

## 2017-05-12 LAB — BASIC METABOLIC PANEL
Anion gap: 7 (ref 5–15)
BUN: 14 mg/dL (ref 6–20)
CO2: 27 mmol/L (ref 22–32)
Calcium: 9.6 mg/dL (ref 8.9–10.3)
Chloride: 105 mmol/L (ref 101–111)
Creatinine, Ser: 0.81 mg/dL (ref 0.44–1.00)
GFR calc Af Amer: >60 mL/min (ref >60)
GFR calc non Af Amer: >60 mL/min (ref >60)
Glucose, Bld: 140 mg/dL — ABNORMAL HIGH (ref 65–99)
Potassium: 4 mmol/L (ref 3.5–5.1)
Sodium: 139 mmol/L (ref 135–145)

## 2017-05-12 LAB — CBC
HCT: 40.5 % (ref 36.0–46.0)
Hemoglobin: 13.3 g/dL (ref 12.0–15.0)
MCH: 29.4 pg (ref 26.0–34.0)
MCHC: 32.8 g/dL (ref 30.0–36.0)
MCV: 89.6 fL (ref 78.0–100.0)
Platelets: 138 10*3/uL — ABNORMAL LOW (ref 150–400)
RBC: 4.52 MIL/uL (ref 3.87–5.11)
RDW: 15.5 % (ref 11.5–15.5)
WBC: 5.5 10*3/uL (ref 4.0–10.5)

## 2017-05-12 LAB — GLUCOSE, CAPILLARY: Glucose-Capillary: 138 mg/dL — ABNORMAL HIGH (ref 65–99)

## 2017-05-12 NOTE — Progress Notes (Addendum)
PCP - Dr. [name]  Cardiologist - Dr. Marlou Porch  Chest x-ray - 04/22/17 (E)  EKG - 12/17/16 (E)  Stress Test - 03/22/15 (E)  ECHO - 05/04/17 (E)  Cardiac Cath - Denies  Sleep Study - No CPAP - None  HA1C- 03/05/17- 6.0 Fasting Blood Sugar - 120-140, Today 138 Checks Blood Sugar ____1_ time a day Pt sts her last dose of Aspirin was 05/03/17, per the surgeon.  Chart will be given to anesthesia for review due to history, and Cardiac clearance note from Dr. Marlou Porch in [place].  Pt denies having chest pain, sob, or fever at this time. All instructions explained to the pt, with a verbal understanding of the material. Pt agrees to go over the instructions while at home for a better understanding. The opportunity to ask questions was provided.

## 2017-05-13 ENCOUNTER — Encounter (HOSPITAL_COMMUNITY): Payer: Self-pay

## 2017-05-13 NOTE — Progress Notes (Signed)
Anesthesia chart review: Patient is a 71 year old female scheduled for ventral hernia repair with mesh on 05/15/2017 by Dr. Autumn Messing.  History includes moderate aortic stenosis, DM2, former smoker (quit '12), HTN, hypercholesterolemia, COPD Gold B, fatty liver, hypothyroidism, migraines, right mastectomy (DCIS) '92, left breast lumpectomy (ADH/ALH) 12/22/16, TURBT (for low grade papillary urothelial carcinoma) 01/12/17, cholecystectomy '03, umbilical hernia repair '03. BMI is consistent with morbid obesity.   - PCP is Dr. [name]. - Cardiologist is Dr. Candee Furbish, last visit 04/30/17. He wrote, "she may proceed with ventral hernia repair with low to intermediate overall risk from a cardiovascular perspective." - Pulmonologist is Dr. Marshell Garfinkel, last visit 09/24/16. - HEM-ONC is Dr. Nicholas Lose. Patient seen 02/16/17 for evaluation of left breast atypical ductal hyperplasia and atypical lobular hyperplasia s/p left breast lumpectomy. Annual mammograms and breast exams and follow-up with her primary care physician. PRN oncology follow-up recommended.  - Urologist is Dr. Dutch Gray  Meds include ASA (last dose 05/03/17 per surgeon), albuterol, bisoprolol-HCTZ, cetirizine, Flonase, Trelegy Ellipta, Lasix, metformin, Zocor, Valtrex.   BP 136/64   Pulse 79   Temp 36.6 C   Resp 20   Ht 5' 4.5" (1.638 m)   Wt 244 lb 9.6 oz (110.9 kg)   LMP  (LMP Unknown)   SpO2 97%   BMI 41.34 kg/m   EKG 12/17/16: NSR, low voltage QRS.  Echo 05/04/17: Study Conclusions - Left ventricle: The cavity size was normal. Systolic function was   normal. The estimated ejection fraction was in the range of 60%   to 65%. Wall motion was normal; there were no regional wall   motion abnormalities. There was an increased relative   contribution of atrial contraction to ventricular filling.   Doppler parameters are consistent with abnormal left ventricular   relaxation (grade 1 diastolic dysfunction). Doppler  parameters   are consistent with high ventricular filling pressure. - Aortic valve: Valve mobility was restricted. There was moderate   stenosis. Mean gradient (S): 22 mm Hg. Valve area (VTI): 0.94   cm^2. Valve area (Vmax): 0.8 cm^2. Valve area (Vmean): 0.83 cm^2. - Mitral valve: Calcified annulus. There was trivial regurgitation. - Pulmonic valve: There was trivial regurgitation. Impressions: - No significant change in degree of AS from prior study. Mean AV   gradient has increased slightly (20>2mmHg).  Nuclear stress test 03/22/15:  Nuclear stress EF: 71%.   There was no ST segment deviation noted during stress.   The study is normal. No evidence of ischemia. No evidence of infarction .   This is a low risk study.  CXR 04/22/17: IMPRESSION: No acute findings appear  PFTs 09/24/16: FVC 2. (82%) FEV1 1.43 (62%) F/F 58  TLC 99% DLCO 54% Moderate obstruction with moderate diffusion defect. No bronchodilator response.  Preoperative labs noted.   If no acute changes then anticipate she can proceed as planned.  [facility] Short Stay Center/Anesthesiology Phone [phone] 05/13/2017 2:40 PM

## 2017-05-15 ENCOUNTER — Ambulatory Visit (HOSPITAL_COMMUNITY): Payer: Medicare Other | Admitting: Anesthesiology

## 2017-05-15 ENCOUNTER — Inpatient Hospital Stay (HOSPITAL_COMMUNITY)
Admission: RE | Admit: 2017-05-15 | Discharge: 2017-05-18 | DRG: 354 | Disposition: A | Discharge status: Home / Self Care | Payer: Medicare Other | Source: Ambulatory Visit | Attending: General Surgery | Admitting: General Surgery

## 2017-05-15 ENCOUNTER — Encounter (HOSPITAL_COMMUNITY)
Admission: RE | Disposition: A | Discharge status: Home / Self Care | Payer: Self-pay | Source: Ambulatory Visit | Attending: General Surgery

## 2017-05-15 ENCOUNTER — Encounter (HOSPITAL_COMMUNITY): Payer: Self-pay

## 2017-05-15 ENCOUNTER — Other Ambulatory Visit: Payer: Self-pay

## 2017-05-15 ENCOUNTER — Ambulatory Visit (HOSPITAL_COMMUNITY): Payer: Medicare Other | Admitting: Vascular Surgery

## 2017-05-15 DIAGNOSIS — Z881 Allergy status to other antibiotic agents status: Secondary | ICD-10-CM | POA: Diagnosis not present

## 2017-05-15 DIAGNOSIS — I10 Essential (primary) hypertension: Secondary | ICD-10-CM | POA: Diagnosis not present

## 2017-05-15 DIAGNOSIS — Z885 Allergy status to narcotic agent status: Secondary | ICD-10-CM | POA: Diagnosis not present

## 2017-05-15 DIAGNOSIS — Z91041 Radiographic dye allergy status: Secondary | ICD-10-CM | POA: Diagnosis not present

## 2017-05-15 DIAGNOSIS — L0882 Omphalitis not of newborn: Secondary | ICD-10-CM | POA: Diagnosis not present

## 2017-05-15 DIAGNOSIS — Z79899 Other long term (current) drug therapy: Secondary | ICD-10-CM | POA: Diagnosis not present

## 2017-05-15 DIAGNOSIS — E119 Type 2 diabetes mellitus without complications: Secondary | ICD-10-CM | POA: Diagnosis not present

## 2017-05-15 DIAGNOSIS — K439 Ventral hernia without obstruction or gangrene: Secondary | ICD-10-CM

## 2017-05-15 DIAGNOSIS — Z7984 Long term (current) use of oral hypoglycemic drugs: Secondary | ICD-10-CM | POA: Diagnosis not present

## 2017-05-15 DIAGNOSIS — I2510 Atherosclerotic heart disease of native coronary artery without angina pectoris: Secondary | ICD-10-CM | POA: Diagnosis not present

## 2017-05-15 DIAGNOSIS — J449 Chronic obstructive pulmonary disease, unspecified: Secondary | ICD-10-CM | POA: Diagnosis not present

## 2017-05-15 DIAGNOSIS — Z882 Allergy status to sulfonamides status: Secondary | ICD-10-CM | POA: Diagnosis not present

## 2017-05-15 DIAGNOSIS — Z6841 Body Mass Index (BMI) 40.0 and over, adult: Secondary | ICD-10-CM | POA: Diagnosis not present

## 2017-05-15 DIAGNOSIS — Z7951 Long term (current) use of inhaled steroids: Secondary | ICD-10-CM | POA: Diagnosis not present

## 2017-05-15 DIAGNOSIS — Z7982 Long term (current) use of aspirin: Secondary | ICD-10-CM | POA: Diagnosis not present

## 2017-05-15 DIAGNOSIS — Z888 Allergy status to other drugs, medicaments and biological substances status: Secondary | ICD-10-CM | POA: Diagnosis not present

## 2017-05-15 DIAGNOSIS — Z87891 Personal history of nicotine dependence: Secondary | ICD-10-CM | POA: Diagnosis not present

## 2017-05-15 HISTORY — DX: Dependence on supplemental oxygen: Z99.81

## 2017-05-15 HISTORY — DX: Malignant neoplasm of unspecified site of right female breast: C50.911

## 2017-05-15 HISTORY — PX: INSERTION OF MESH: SHX5868

## 2017-05-15 HISTORY — PX: VENTRAL HERNIA REPAIR: SHX424

## 2017-05-15 HISTORY — DX: Ventral hernia without obstruction or gangrene: K43.9

## 2017-05-15 HISTORY — DX: Other allergy status, other than to drugs and biological substances: Z91.09

## 2017-05-15 HISTORY — PX: ABDOMINAL HERNIA REPAIR: SHX539

## 2017-05-15 HISTORY — DX: Malignant neoplasm of bladder, unspecified: C67.9

## 2017-05-15 LAB — GLUCOSE, CAPILLARY
Glucose-Capillary: 106 mg/dL — ABNORMAL HIGH (ref 65–99)
Glucose-Capillary: 174 mg/dL — ABNORMAL HIGH (ref 65–99)
Glucose-Capillary: 197 mg/dL — ABNORMAL HIGH (ref 65–99)
Glucose-Capillary: 235 mg/dL — ABNORMAL HIGH (ref 65–99)
Glucose-Capillary: 200 mg/dL — ABNORMAL HIGH (ref 65–99)

## 2017-05-15 SURGERY — REPAIR, HERNIA, VENTRAL
Anesthesia: General | Site: Abdomen

## 2017-05-15 MED ORDER — HYDROMORPHONE HCL 1 MG/ML IJ SOLN
INTRAMUSCULAR | Status: AC
  Filled 2017-05-15: qty 1

## 2017-05-15 MED ORDER — MIDAZOLAM HCL 2 MG/2ML IJ SOLN
INTRAMUSCULAR | Status: AC
  Filled 2017-05-15: qty 2

## 2017-05-15 MED ORDER — ROCURONIUM BROMIDE 100 MG/10ML IV SOLN
INTRAVENOUS | Status: DC | PRN
  Administered 2017-05-15: 10 mg via INTRAVENOUS
  Administered 2017-05-15: 50 mg via INTRAVENOUS
  Administered 2017-05-15: 10 mg via INTRAVENOUS

## 2017-05-15 MED ORDER — IPRATROPIUM BROMIDE 0.02 % IN SOLN: 0.5000 mg | Freq: Four times a day (QID) | RESPIRATORY_TRACT | Status: DC

## 2017-05-15 MED ORDER — PROPOFOL 10 MG/ML IV BOLUS
INTRAVENOUS | Status: DC | PRN
  Administered 2017-05-15: 120 mg via INTRAVENOUS

## 2017-05-15 MED ORDER — FLUTICASONE-UMECLIDIN-VILANT 100-62.5-25 MCG/INH IN AEPB: 1.0000 | INHALATION_SPRAY | Freq: Every day | RESPIRATORY_TRACT | Status: DC

## 2017-05-15 MED ORDER — ALBUTEROL SULFATE (2.5 MG/3ML) 0.083% IN NEBU: 2.5000 mg | INHALATION_SOLUTION | RESPIRATORY_TRACT | Status: DC | PRN

## 2017-05-15 MED ORDER — BISOPROLOL-HYDROCHLOROTHIAZIDE 10-6.25 MG PO TABS
1.0000 | ORAL_TABLET | Freq: Every day | ORAL | Status: DC
  Administered 2017-05-16 – 2017-05-18 (×3): 1 via ORAL
  Filled 2017-05-15 (×3): qty 1

## 2017-05-15 MED ORDER — LIDOCAINE HCL (CARDIAC) 20 MG/ML IV SOLN
INTRAVENOUS | Status: DC | PRN
  Administered 2017-05-15: 60 mg via INTRAVENOUS

## 2017-05-15 MED ORDER — CEFAZOLIN SODIUM-DEXTROSE 2-4 GM/100ML-% IV SOLN
2.0000 g | INTRAVENOUS | Status: AC
  Administered 2017-05-15: 2 g via INTRAVENOUS

## 2017-05-15 MED ORDER — LACTATED RINGERS IV SOLN
INTRAVENOUS | Status: DC | PRN
  Administered 2017-05-15 (×2): via INTRAVENOUS

## 2017-05-15 MED ORDER — FENTANYL CITRATE (PF) 250 MCG/5ML IJ SOLN
INTRAMUSCULAR | Status: AC
  Filled 2017-05-15: qty 5

## 2017-05-15 MED ORDER — LIDOCAINE 2% (20 MG/ML) 5 ML SYRINGE
INTRAMUSCULAR | Status: AC
  Filled 2017-05-15: qty 5

## 2017-05-15 MED ORDER — FLUTICASONE PROPIONATE 50 MCG/ACT NA SUSP
2.0000 | Freq: Every day | NASAL | Status: DC
  Administered 2017-05-16 – 2017-05-18 (×2): 2 via NASAL
  Filled 2017-05-15: qty 16

## 2017-05-15 MED ORDER — OXYCODONE HCL 5 MG/5ML PO SOLN: 5.0000 mg | Freq: Once | ORAL | Status: DC | PRN

## 2017-05-15 MED ORDER — POTASSIUM CHLORIDE IN NACL 20-0.9 MEQ/L-% IV SOLN
INTRAVENOUS | Status: DC
  Administered 2017-05-16: 07:00:00 via INTRAVENOUS
  Filled 2017-05-15 (×2): qty 1000

## 2017-05-15 MED ORDER — WHITE PETROLATUM EX OINT
TOPICAL_OINTMENT | CUTANEOUS | Status: AC
  Filled 2017-05-15: qty 28.35

## 2017-05-15 MED ORDER — OXYCODONE HCL 5 MG PO TABS: 5.0000 mg | ORAL_TABLET | Freq: Once | ORAL | Status: DC | PRN

## 2017-05-15 MED ORDER — METHOCARBAMOL 500 MG PO TABS: 500.0000 mg | ORAL_TABLET | Freq: Four times a day (QID) | ORAL | Status: DC | PRN

## 2017-05-15 MED ORDER — PHENYLEPHRINE HCL 10 MG/ML IJ SOLN
INTRAVENOUS | Status: DC | PRN
  Administered 2017-05-15: 40 ug/min via INTRAVENOUS

## 2017-05-15 MED ORDER — ACETAMINOPHEN 500 MG PO TABS
1000.0000 mg | ORAL_TABLET | ORAL | Status: AC
  Administered 2017-05-15: 1000 mg via ORAL

## 2017-05-15 MED ORDER — INSULIN ASPART 100 UNIT/ML ~~LOC~~ SOLN
0.0000 [IU] | Freq: Three times a day (TID) | SUBCUTANEOUS | Status: DC
  Administered 2017-05-15: 3 [IU] via SUBCUTANEOUS
  Administered 2017-05-16 (×2): 2 [IU] via SUBCUTANEOUS
  Administered 2017-05-16 – 2017-05-17 (×2): 1 [IU] via SUBCUTANEOUS
  Administered 2017-05-17 – 2017-05-18 (×3): 2 [IU] via SUBCUTANEOUS
  Administered 2017-05-18: 1 [IU] via SUBCUTANEOUS

## 2017-05-15 MED ORDER — GABAPENTIN 300 MG PO CAPS
ORAL_CAPSULE | ORAL | Status: AC
  Administered 2017-05-15: 300 mg via ORAL
  Filled 2017-05-15: qty 1

## 2017-05-15 MED ORDER — DEXAMETHASONE SODIUM PHOSPHATE 10 MG/ML IJ SOLN
INTRAMUSCULAR | Status: AC
  Filled 2017-05-15: qty 1

## 2017-05-15 MED ORDER — VALACYCLOVIR HCL 500 MG PO TABS
500.0000 mg | ORAL_TABLET | Freq: Every day | ORAL | Status: DC
  Administered 2017-05-16 – 2017-05-18 (×3): 500 mg via ORAL
  Filled 2017-05-15 (×3): qty 1

## 2017-05-15 MED ORDER — CHLORHEXIDINE GLUCONATE CLOTH 2 % EX PADS: 6.0000 | MEDICATED_PAD | Freq: Once | CUTANEOUS | Status: DC

## 2017-05-15 MED ORDER — PROPOFOL 10 MG/ML IV BOLUS
INTRAVENOUS | Status: AC
  Filled 2017-05-15: qty 20

## 2017-05-15 MED ORDER — ONDANSETRON 4 MG PO TBDP: 4.0000 mg | ORAL_TABLET | Freq: Four times a day (QID) | ORAL | Status: DC | PRN

## 2017-05-15 MED ORDER — HYDROCODONE-ACETAMINOPHEN 5-325 MG PO TABS
1.0000 | ORAL_TABLET | ORAL | Status: DC | PRN
  Administered 2017-05-16 – 2017-05-18 (×10): 2 via ORAL
  Filled 2017-05-15 (×10): qty 2

## 2017-05-15 MED ORDER — GABAPENTIN 300 MG PO CAPS
300.0000 mg | ORAL_CAPSULE | ORAL | Status: AC
  Administered 2017-05-15: 300 mg via ORAL

## 2017-05-15 MED ORDER — CEFAZOLIN SODIUM-DEXTROSE 2-4 GM/100ML-% IV SOLN
INTRAVENOUS | Status: AC
  Filled 2017-05-15: qty 100

## 2017-05-15 MED ORDER — SUGAMMADEX SODIUM 500 MG/5ML IV SOLN
INTRAVENOUS | Status: DC | PRN
  Administered 2017-05-15: 200 mg via INTRAVENOUS

## 2017-05-15 MED ORDER — METFORMIN HCL ER 500 MG PO TB24
500.0000 mg | ORAL_TABLET | Freq: Three times a day (TID) | ORAL | Status: DC
  Administered 2017-05-15 – 2017-05-18 (×9): 500 mg via ORAL
  Filled 2017-05-15 (×9): qty 1

## 2017-05-15 MED ORDER — FENTANYL CITRATE (PF) 100 MCG/2ML IJ SOLN
INTRAMUSCULAR | Status: DC | PRN
  Administered 2017-05-15: 100 ug via INTRAVENOUS
  Administered 2017-05-15: 25 ug via INTRAVENOUS
  Administered 2017-05-15: 50 ug via INTRAVENOUS
  Administered 2017-05-15: 25 ug via INTRAVENOUS
  Administered 2017-05-15: 50 ug via INTRAVENOUS

## 2017-05-15 MED ORDER — ONDANSETRON HCL 4 MG/2ML IJ SOLN
INTRAMUSCULAR | Status: DC | PRN
  Administered 2017-05-15: 4 mg via INTRAVENOUS

## 2017-05-15 MED ORDER — FENTANYL CITRATE (PF) 100 MCG/2ML IJ SOLN: 25.0000 ug | INTRAMUSCULAR | Status: DC | PRN

## 2017-05-15 MED ORDER — ONDANSETRON HCL 4 MG/2ML IJ SOLN: 4.0000 mg | Freq: Four times a day (QID) | INTRAMUSCULAR | Status: DC | PRN

## 2017-05-15 MED ORDER — SIMVASTATIN 20 MG PO TABS
20.0000 mg | ORAL_TABLET | Freq: Every evening | ORAL | Status: DC
Start: 2017-05-15 — End: 2017-05-18
  Administered 2017-05-15 – 2017-05-17 (×3): 20 mg via ORAL
  Filled 2017-05-15 (×3): qty 1

## 2017-05-15 MED ORDER — IPRATROPIUM BROMIDE 0.02 % IN SOLN
0.5000 mg | Freq: Two times a day (BID) | RESPIRATORY_TRACT | Status: DC
  Administered 2017-05-16 – 2017-05-18 (×5): 0.5 mg via RESPIRATORY_TRACT
  Filled 2017-05-15 (×5): qty 2.5

## 2017-05-15 MED ORDER — ARFORMOTEROL TARTRATE 15 MCG/2ML IN NEBU
15.0000 ug | INHALATION_SOLUTION | Freq: Two times a day (BID) | RESPIRATORY_TRACT | Status: DC
  Administered 2017-05-16 – 2017-05-18 (×5): 15 ug via RESPIRATORY_TRACT
  Filled 2017-05-15 (×5): qty 2

## 2017-05-15 MED ORDER — ALBUTEROL SULFATE HFA 108 (90 BASE) MCG/ACT IN AERS: 2.0000 | INHALATION_SPRAY | Freq: Four times a day (QID) | RESPIRATORY_TRACT | Status: DC | PRN

## 2017-05-15 MED ORDER — BUDESONIDE 0.25 MG/2ML IN SUSP
0.2500 mg | Freq: Two times a day (BID) | RESPIRATORY_TRACT | Status: DC
  Administered 2017-05-16 – 2017-05-18 (×5): 0.25 mg via RESPIRATORY_TRACT
  Filled 2017-05-15 (×5): qty 2

## 2017-05-15 MED ORDER — ACETAMINOPHEN 500 MG PO TABS
ORAL_TABLET | ORAL | Status: AC
  Administered 2017-05-15: 1000 mg via ORAL
  Filled 2017-05-15: qty 2

## 2017-05-15 MED ORDER — HYDROMORPHONE HCL 1 MG/ML IJ SOLN
0.2500 mg | INTRAMUSCULAR | Status: DC | PRN
  Administered 2017-05-15: 0.5 mg via INTRAVENOUS

## 2017-05-15 MED ORDER — FUROSEMIDE 20 MG PO TABS: 20.0000 mg | ORAL_TABLET | Freq: Every day | ORAL | Status: DC | PRN

## 2017-05-15 MED ORDER — HEPARIN SODIUM (PORCINE) 5000 UNIT/ML IJ SOLN
5000.0000 [IU] | Freq: Three times a day (TID) | INTRAMUSCULAR | Status: DC
  Administered 2017-05-16 – 2017-05-18 (×7): 5000 [IU] via SUBCUTANEOUS
  Filled 2017-05-15 (×5): qty 1

## 2017-05-15 MED ORDER — ONDANSETRON HCL 4 MG/2ML IJ SOLN
INTRAMUSCULAR | Status: AC
  Filled 2017-05-15: qty 2

## 2017-05-15 MED ORDER — PANTOPRAZOLE SODIUM 40 MG IV SOLR
40.0000 mg | Freq: Every day | INTRAVENOUS | Status: DC
  Administered 2017-05-15: 40 mg via INTRAVENOUS
  Filled 2017-05-15: qty 40

## 2017-05-15 MED ORDER — 0.9 % SODIUM CHLORIDE (POUR BTL) OPTIME
TOPICAL | Status: DC | PRN
  Administered 2017-05-15: 1000 mL

## 2017-05-15 MED ORDER — DEXAMETHASONE SODIUM PHOSPHATE 10 MG/ML IJ SOLN
INTRAMUSCULAR | Status: DC | PRN
  Administered 2017-05-15: 10 mg via INTRAVENOUS

## 2017-05-15 MED ORDER — ROCURONIUM BROMIDE 10 MG/ML (PF) SYRINGE
PREFILLED_SYRINGE | INTRAVENOUS | Status: AC
  Filled 2017-05-15: qty 5

## 2017-05-15 SURGICAL SUPPLY — 46 items
BIOPATCH RED 1 DISK 7.0 (GAUZE/BANDAGES/DRESSINGS) ×1 IMPLANT
BLADE CLIPPER SURG (BLADE) IMPLANT
CANISTER SUCT 3000ML PPV (MISCELLANEOUS) ×2 IMPLANT
CHLORAPREP W/TINT 26ML (MISCELLANEOUS) ×2 IMPLANT
COVER SURGICAL LIGHT HANDLE (MISCELLANEOUS) ×2 IMPLANT
DRAIN CHANNEL 19F RND (DRAIN) ×1 IMPLANT
DRAPE LAPAROSCOPIC ABDOMINAL (DRAPES) ×2 IMPLANT
DRAPE UTILITY XL STRL (DRAPES) ×2 IMPLANT
DRSG OPSITE POSTOP 4X8 (GAUZE/BANDAGES/DRESSINGS) ×1 IMPLANT
DRSG TEGADERM 4X4.75 (GAUZE/BANDAGES/DRESSINGS) ×1 IMPLANT
ELECT CAUTERY BLADE 6.4 (BLADE) ×2 IMPLANT
ELECT REM PT RETURN 9FT ADLT (ELECTROSURGICAL) ×2
ELECTRODE REM PT RTRN 9FT ADLT (ELECTROSURGICAL) ×1 IMPLANT
EVACUATOR SILICONE 100CC (DRAIN) ×1 IMPLANT
GLOVE BIO SURGEON STRL SZ7.5 (GLOVE) ×2 IMPLANT
GOWN STRL REUS W/ TWL LRG LVL3 (GOWN DISPOSABLE) ×2 IMPLANT
GOWN STRL REUS W/TWL LRG LVL3 (GOWN DISPOSABLE) ×4
KIT BASIN OR (CUSTOM PROCEDURE TRAY) ×2 IMPLANT
KIT ROOM TURNOVER OR (KITS) ×2 IMPLANT
LIGASURE IMPACT 36 18CM CVD LR (INSTRUMENTS) ×1 IMPLANT
MARKER SKIN DUAL TIP RULER LAB (MISCELLANEOUS) ×1 IMPLANT
MESH VENTRALIGHT ST 6X8 (Mesh Specialty) ×2 IMPLANT
MESH VENTRLGHT ELLIPSE 8X6XMFL (Mesh Specialty) IMPLANT
NDL HYPO 25GX1X1/2 BEV (NEEDLE) ×1 IMPLANT
NEEDLE HYPO 25GX1X1/2 BEV (NEEDLE) ×2 IMPLANT
NS IRRIG 1000ML POUR BTL (IV SOLUTION) ×2 IMPLANT
PACK GENERAL/GYN (CUSTOM PROCEDURE TRAY) ×2 IMPLANT
PAD ARMBOARD 7.5X6 YLW CONV (MISCELLANEOUS) ×2 IMPLANT
SPONGE GAUZE 4X4 12PLY STER LF (GAUZE/BANDAGES/DRESSINGS) IMPLANT
STAPLER VISISTAT 35W (STAPLE) ×1 IMPLANT
SUT ETHILON 2 0 FS 18 (SUTURE) ×1 IMPLANT
SUT NOVA 1 T20/GS 25DT (SUTURE) ×1 IMPLANT
SUT NOVA NAB DX-16 0-1 5-0 T12 (SUTURE) ×7 IMPLANT
SUT NOVA NAB GS-21 1 T12 (SUTURE) ×1 IMPLANT
SUT PROLENE 1 CT (SUTURE) IMPLANT
SUT SILK 2 0 (SUTURE) ×4
SUT SILK 2-0 18XBRD TIE 12 (SUTURE) ×1 IMPLANT
SUT VIC AB 2-0 CT1 36 (SUTURE) ×1 IMPLANT
SUT VIC AB 3-0 54X BRD REEL (SUTURE) IMPLANT
SUT VIC AB 3-0 BRD 54 (SUTURE)
SUT VIC AB 3-0 SH 27 (SUTURE)
SUT VIC AB 3-0 SH 27XBRD (SUTURE) IMPLANT
SYR CONTROL 10ML LL (SYRINGE) ×2 IMPLANT
TOWEL OR 17X24 6PK STRL BLUE (TOWEL DISPOSABLE) ×2 IMPLANT
TOWEL OR 17X26 10 PK STRL BLUE (TOWEL DISPOSABLE) ×2 IMPLANT
TRAY FOLEY CATH 16FR SILVER (SET/KITS/TRAYS/PACK) IMPLANT

## 2017-05-15 NOTE — Interval H&P Note (Signed)
History and Physical Interval Note:  05/15/2017 7:15 AM  Shannon Schultz  has presented today for surgery, with the diagnosis of ventral hernia  The various methods of treatment have been discussed with the patient and family. After consideration of risks, benefits and other options for treatment, the patient has consented to  Procedure(s): VENTRAL HERNIA REPAIR WITH MESH (N/A) INSERTION OF MESH (N/A) as a surgical intervention .  The patient's history has been reviewed, patient examined, no change in status, stable for surgery.  I have reviewed the patient's chart and labs.  Questions were answered to the patient's satisfaction.     TOTH III,Andrae Claunch S

## 2017-05-15 NOTE — Anesthesia Procedure Notes (Signed)
Procedure Name: Intubation Date/Time: 05/15/2017 7:53 AM Performed by: Rica Koyanagi, MD Pre-anesthesia Checklist: Patient identified, Emergency Drugs available, Suction available, Patient being monitored and Timeout performed Patient Re-evaluated:Patient Re-evaluated prior to induction Oxygen Delivery Method: Circle system utilized Preoxygenation: Pre-oxygenation with 100% oxygen Induction Type: IV induction Ventilation: Mask ventilation without difficulty Laryngoscope Size: Mac and 3 Grade View: Grade II Tube type: Oral Tube size: 7.5 mm Number of attempts: 1 Airway Equipment and Method: Stylet Placement Confirmation: ETT inserted through vocal cords under direct vision,  positive ETCO2 and breath sounds checked- equal and bilateral Secured at: 21 cm Tube secured with: Tape Dental Injury: Teeth and Oropharynx as per pre-operative assessment  Comments: Cords visualized with head lift/cricoid press.

## 2017-05-15 NOTE — Transfer of Care (Signed)
Immediate Anesthesia Transfer of Care Note  Patient: Shannon Schultz  Procedure(s) Performed: VENTRAL HERNIA REPAIR WITH MESH (N/A Abdomen) INSERTION OF MESH (N/A Abdomen)  Patient Location: PACU  Anesthesia Type:General  Level of Consciousness: awake, oriented, drowsy, patient cooperative and responds to stimulation  Airway & Oxygen Therapy: Patient Spontanous Breathing and Patient connected to face mask oxygen  Post-op Assessment: Report given to RN, Post -op Vital signs reviewed and stable and Patient moving all extremities  Post vital signs: Reviewed and stable  Last Vitals:  Vitals:   05/15/17 0611  BP: (!) 149/60  Pulse: 88  Resp: 18  Temp: 36.7 C  SpO2: 96%    Last Pain:  Vitals:   05/15/17 0611  TempSrc: Oral      Patients Stated Pain Goal: 0 (02/10/87 7195)  Complications: No apparent anesthesia complications

## 2017-05-15 NOTE — Op Note (Signed)
05/15/2017  9:57 AM  PATIENT:  Shannon Schultz  71 y.o. female  PRE-OPERATIVE DIAGNOSIS:  ventral hernia  POST-OPERATIVE DIAGNOSIS:  ventral hernia  PROCEDURE:  Procedure(s): VENTRAL HERNIA REPAIR WITH MESH (N/A) INSERTION OF MESH (N/A)  SURGEON:  Surgeon(s) and Role:    * Jovita Kussmaul, MD - Primary    * Clovis Riley, MD - Assisting  PHYSICIAN ASSISTANT:   ASSISTANTS: Dr. Kae Heller   ANESTHESIA:   general  EBL:  25 mL   BLOOD ADMINISTERED:none  DRAINS: (1) Jackson-Pratt drain(s) with closed bulb suction in the subq   LOCAL MEDICATIONS USED:  NONE  SPECIMEN:  Source of Specimen:  omentum and umbilicus with old mesh  DISPOSITION OF SPECIMEN:  PATHOLOGY  COUNTS:  YES  TOURNIQUET:  * No tourniquets in log *  DICTATION: .Dragon Dictation   After informed consent was obtained the patient was brought to the operating room and placed in the supine position on the operating table.  After adequate induction of general anesthesia the patient's abdomen was prepped with ChloraPrep, allowed to dry, and draped in usual sterile manner.  An appropriate timeout was performed.  A midline incision was made with a 15 blade knife overlying the palpable hernia.  The incision was carried through the skin and subcutaneous tissue sharply with electrocautery.  The hernia sac was identified and opened.  There were omental adhesions within the hernia sac.  These were taken down sharply with the electrocautery.  I ended up removing part of the omentum that was adherent to the hernia and fascia.  This was done sharply with the LigaSure.  I also was able to identify an old piece of mesh at the umbilicus with significant adhesion to it.  I was able to take these adhesions down sharply with the electrocautery.  There were other Swiss cheeselike defects in the fascia around the area where the mesh was placed.  Once these adhesions were taken down the rest of the abdominal wall was free and could be examined  readily.  The the old mesh along with the umbilicus was removed so that we could open into the surrounding hernias.  Once this was accomplished we could identify the fascial area just circumferentially around the incision.  The fascia appeared thin but intact.  The subcutaneous tissue was then dissected off of the fascia of the abdominal wall circumferentially so that the fascia could be mobilized.  Next a piece of a 15 x 20 cm ventral light mesh was chosen.  The mesh was sewed circumferentially several centimeters back from the fascial edge using #1 Novafil U stitches.  Once this was accomplished the mesh was in good position without any gaps or redundancy.  The wound was irrigated with copious amounts of saline.  The fascia of the anterior abdominal wall was then closed with interrupted #1 of the stitches.  The fascia was closed transversely as it seemed to come together better this way.  A small stab incision was made on the left lower quadrant.  A tonsil clamp was placed through this opening and into the subcutaneous space.  The clamp was used to bring a 19 [place] round Blake drain into the subcutaneous space.  The drain was anchored to the skin with a 3-0 nylon stitch.  The wound was then irrigated again with saline.  The subcutaneous tissue was closed with a running 2-0 Vicryl stitch.  The skin was then closed with staples.  The drain was placed to bulb suction  and there was a good seal.  Sterile dressings were applied.  The patient tolerated the procedure well.  At the end of the case all needle sponge and instrument counts were correct.  The patient was then awakened and taken to recovery in stable condition.  PLAN OF CARE: Admit to inpatient   PATIENT DISPOSITION:  PACU - hemodynamically stable.   Delay start of Pharmacological VTE agent (>24hrs) due to surgical blood loss or risk of bleeding: no

## 2017-05-15 NOTE — Anesthesia Preprocedure Evaluation (Addendum)
Anesthesia Evaluation  Patient identified by MRN, date of birth, ID band Patient awake    Reviewed: Allergy & Precautions, NPO status , Patient's Chart, lab work & pertinent test results  Airway Mallampati: II  TM Distance: >3 FB Neck ROM: Full    Dental  (+) Teeth Intact   Pulmonary shortness of breath, COPD, former smoker,    breath sounds clear to auscultation       Cardiovascular hypertension, + CAD and + Peripheral Vascular Disease  + Valvular Problems/Murmurs AS  Rhythm:Regular Rate:Normal + Systolic murmurs    Neuro/Psych negative neurological ROS     GI/Hepatic   Endo/Other  diabetesMorbid obesity  Renal/GU      Musculoskeletal   Abdominal (+) + obese,   Peds  Hematology   Anesthesia Other Findings   Reproductive/Obstetrics                            Anesthesia Physical Anesthesia Plan  ASA: III  Anesthesia Plan: General   Post-op Pain Management:    Induction: Intravenous  PONV Risk Score and Plan: 4 or greater and Treatment may vary due to age or medical condition, Dexamethasone and Ondansetron  Airway Management Planned: Oral ETT  Additional Equipment:   Intra-op Plan:   Post-operative Plan: Extubation in OR  Informed Consent: I have reviewed the patients History and Physical, chart, labs and discussed the procedure including the risks, benefits and alternatives for the proposed anesthesia with the patient or authorized representative who has indicated his/her understanding and acceptance.   Dental advisory given  Plan Discussed with: CRNA  Anesthesia Plan Comments:         Anesthesia Quick Evaluation

## 2017-05-15 NOTE — Anesthesia Postprocedure Evaluation (Signed)
Anesthesia Post Note  Patient: SHANNYN JANKOWIAK  Procedure(s) Performed: VENTRAL HERNIA REPAIR WITH MESH (N/A Abdomen) INSERTION OF MESH (N/A Abdomen)     Patient location during evaluation: PACU Anesthesia Type: General Level of consciousness: awake and sedated Pain management: pain level controlled Vital Signs Assessment: post-procedure vital signs reviewed and stable Respiratory status: spontaneous breathing, nonlabored ventilation, respiratory function stable and patient connected to nasal cannula oxygen Cardiovascular status: blood pressure returned to baseline and stable Postop Assessment: no apparent nausea or vomiting Anesthetic complications: no    Last Vitals:  Vitals:   05/15/17 1140 05/15/17 1212  BP: 130/68 138/64  Pulse: 80 88  Resp: 14 15  Temp: 36.5 C 36.7 C  SpO2: 95% 94%    Last Pain:  Vitals:   05/15/17 1212  TempSrc: Oral  PainSc:                  Lubna Stegeman,JAMES TERRILL

## 2017-05-15 NOTE — H&P (Signed)
Shannon Schultz  Location: [facility] Patient #: [id number] DOB: 1945-07-25 Married / Language: English / Race: White Female   History of Present Illness  The patient is a 71 year old female who presents for a follow-up for Abdominal swelling. 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 She also has a large ventral hernia secondary to several previous surgeries. The hernia has become painful for her at times. She denies any nausea or vomiting.   Allergies Latex  Iodinated Contrast Media  Codeine/Codeine Derivatives  Sulfacetamide Sodium-Sulfur *DERMATOLOGICALS*  Lisinopril *ANTIHYPERTENSIVES*  Levaquin *Fluoroquinolones**  Atorvastatin Calcium *ANTIHYPERLIPIDEMICS*  Allergies Reconciled   Medication History  Simvastatin (20MG  Tablet, Oral) Active. MetFORMIN HCl ER (Oral) Specific strength unknown - Active. Bisoprolol-Hydrochlorothiazide (Oral) Specific strength unknown - Active. Calcium (500MG  Tablet, 1 (one) Oral) Active. Multiple Vitamin (1 (one) Oral) Active. Aspirin (81MG  Capsule DR Part, Oral) Active. Medications Reconciled    Review of Systems  General Not Present- Appetite Loss, Chills, Fatigue, Fever, Night Sweats, Weight Gain and Weight Loss. Skin Present- Dryness. Not Present- Change in Wart/Mole, Hives, Jaundice, New Lesions, Non-Healing Wounds, Rash and Ulcer. HEENT Present- Hoarseness, Ringing in the Ears and Seasonal Allergies. Not Present- Earache, Hearing Loss, Nose Bleed, Oral Ulcers, Sinus Pain, Sore Throat, Visual Disturbances, Wears glasses/contact lenses and Yellow Eyes. Respiratory Not Present- Bloody sputum, Chronic Cough, Difficulty Breathing, Snoring and Wheezing. Breast Not Present- Breast Mass, Breast Pain, Nipple Discharge and Skin Changes. Cardiovascular Present- Shortness of Breath. Not Present- Chest Pain, Difficulty Breathing Lying  Down, Leg Cramps, Palpitations, Rapid Heart Rate and Swelling of Extremities. Gastrointestinal Not Present- Abdominal Pain, Bloating, Bloody Stool, Change in Bowel Habits, Chronic diarrhea, Constipation, Difficulty Swallowing, Excessive gas, Gets full quickly at meals, Hemorrhoids, Indigestion, Nausea, Rectal Pain and Vomiting. Female Genitourinary Present- Frequency. Not Present- Nocturia, Painful Urination, Pelvic Pain and Urgency. Musculoskeletal Present- Joint Pain and Joint Stiffness. Not Present- Back Pain, Muscle Pain, Muscle Weakness and Swelling of Extremities. Neurological Not Present- Decreased Memory, Fainting, Headaches, Numbness, Seizures, Tingling, Tremor, Trouble walking and Weakness. Psychiatric Not Present- Anxiety, Bipolar, Change in Sleep Pattern, Depression, Fearful and Frequent crying. Endocrine Not Present- Cold Intolerance, Excessive Hunger, Hair Changes, Heat Intolerance, Hot flashes and New Diabetes. Hematology Not Present- Blood Thinners, Easy Bruising, Excessive bleeding, Gland problems, HIV and Persistent Infections.  Vitals  Weight: 240.38 lb Height: 64in Body Surface Area: 2.12 m Body Mass Index: 41.26 kg/m  Temp.: 98.65F(Oral)  Pulse: 94 (Regular)  BP: 144/78 (Sitting, Left Arm, Standard)       Physical Exam  General Mental Status-Alert. General Appearance-Consistent with stated age. Hydration-Well hydrated. Voice-Normal.  Head and Neck Head-normocephalic, atraumatic with no lesions or palpable masses. Trachea-midline. Thyroid Gland Characteristics - normal size and consistency.  Eye Eyeball - Bilateral-Extraocular movements intact. Sclera/Conjunctiva - Bilateral-No scleral icterus.  Chest and Lung Exam Chest and lung exam reveals -quiet, even and easy respiratory effort with no use of accessory muscles and on auscultation, normal breath sounds, no adventitious sounds and normal vocal resonance. Inspection Chest  Wall - Normal. Back - normal.  Breast Note: The left periareolar incision is healing nicely with no sign of infection or seroma. She has had a previous right mastectomy. There is no palpable mass of the right chest wall. There is no palpable mass of the left breast. There is no palpable axillary, supraclavicular, or cervical lymphadenopathy.   Cardiovascular Cardiovascular examination reveals -normal heart sounds, regular  rate and rhythm with no murmurs and normal pedal pulses bilaterally.  Abdomen Note: The abdomen is soft with minimal tenderness. There is an obvious bulge that seems to reduce in the right peri-umbilical area. She is morbidly obese.   Neurologic Neurologic evaluation reveals -alert and oriented x 3 with no impairment of recent or remote memory. Mental Status-Normal.  Musculoskeletal Normal Exam - Left-Upper Extremity Strength Normal and Lower Extremity Strength Normal. Normal Exam - Right-Upper Extremity Strength Normal and Lower Extremity Strength Normal.  Lymphatic Head & Neck  General Head & Neck Lymphatics: Bilateral - Description - Normal. Axillary  General Axillary Region: Bilateral - Description - Normal. Tenderness - Non Tender. Femoral & Inguinal  Generalized Femoral & Inguinal Lymphatics: Bilateral - Description - Normal. Tenderness - Non Tender.    Assessment & Plan  VENTRAL HERNIA WITHOUT OBSTRUCTION OR GANGRENE (K43.9) Impression: The patient is about 4 months status post left breast lumpectomy for atypical ductal hyperplasia. She also has a large ventral hernia that is symptomatic. She is ready to schedule surgery to fix the hernia. I have discussed with her in detail the risks and benefits of the operation as well as some of the technical aspects and she understands and wishes to proceed

## 2017-05-16 DIAGNOSIS — Z7982 Long term (current) use of aspirin: Secondary | ICD-10-CM | POA: Diagnosis not present

## 2017-05-16 DIAGNOSIS — Z7984 Long term (current) use of oral hypoglycemic drugs: Secondary | ICD-10-CM | POA: Diagnosis not present

## 2017-05-16 DIAGNOSIS — I10 Essential (primary) hypertension: Secondary | ICD-10-CM | POA: Diagnosis not present

## 2017-05-16 DIAGNOSIS — K439 Ventral hernia without obstruction or gangrene: Secondary | ICD-10-CM | POA: Diagnosis not present

## 2017-05-16 DIAGNOSIS — J449 Chronic obstructive pulmonary disease, unspecified: Secondary | ICD-10-CM | POA: Diagnosis not present

## 2017-05-16 DIAGNOSIS — E119 Type 2 diabetes mellitus without complications: Secondary | ICD-10-CM | POA: Diagnosis not present

## 2017-05-16 DIAGNOSIS — Z6841 Body Mass Index (BMI) 40.0 and over, adult: Secondary | ICD-10-CM | POA: Diagnosis not present

## 2017-05-16 DIAGNOSIS — I2510 Atherosclerotic heart disease of native coronary artery without angina pectoris: Secondary | ICD-10-CM | POA: Diagnosis not present

## 2017-05-16 LAB — BASIC METABOLIC PANEL
Anion gap: 9 (ref 5–15)
BUN: 9 mg/dL (ref 6–20)
CO2: 29 mmol/L (ref 22–32)
Calcium: 8.9 mg/dL (ref 8.9–10.3)
Chloride: 101 mmol/L (ref 101–111)
Creatinine, Ser: 0.69 mg/dL (ref 0.44–1.00)
GFR calc Af Amer: >60 mL/min (ref >60)
GFR calc non Af Amer: >60 mL/min (ref >60)
Glucose, Bld: 138 mg/dL — ABNORMAL HIGH (ref 65–99)
Potassium: 3.8 mmol/L (ref 3.5–5.1)
Sodium: 139 mmol/L (ref 135–145)

## 2017-05-16 LAB — GLUCOSE, CAPILLARY
Glucose-Capillary: 132 mg/dL — ABNORMAL HIGH (ref 65–99)
Glucose-Capillary: 162 mg/dL — ABNORMAL HIGH (ref 65–99)
Glucose-Capillary: 192 mg/dL — ABNORMAL HIGH (ref 65–99)
Glucose-Capillary: 171 mg/dL — ABNORMAL HIGH (ref 65–99)

## 2017-05-16 LAB — CBC
HCT: 39.6 % (ref 36.0–46.0)
Hemoglobin: 12.5 g/dL (ref 12.0–15.0)
MCH: 28.6 pg (ref 26.0–34.0)
MCHC: 31.6 g/dL (ref 30.0–36.0)
MCV: 90.6 fL (ref 78.0–100.0)
Platelets: 159 10*3/uL (ref 150–400)
RBC: 4.37 MIL/uL (ref 3.87–5.11)
RDW: 15.5 % (ref 11.5–15.5)
WBC: 8.3 10*3/uL (ref 4.0–10.5)

## 2017-05-16 MED ORDER — PANTOPRAZOLE SODIUM 40 MG PO TBEC
40.0000 mg | DELAYED_RELEASE_TABLET | Freq: Every day | ORAL | Status: DC
  Administered 2017-05-16 – 2017-05-17 (×2): 40 mg via ORAL
  Filled 2017-05-16 (×2): qty 1

## 2017-05-16 NOTE — Progress Notes (Signed)
1 Day Post-Op   Subjective/Chief Complaint: Complains of soreness. Otherwise is ok   Objective: Vital signs in last 24 hours: Temp:  [97.6 F (36.4 C)-98.6 F (37 C)] 98.6 F (37 C) (11/17 0500) Pulse Rate:  [78-90] 90 (11/17 0500) Resp:  [13-18] 18 (11/17 0500) BP: (126-145)/(55-73) 145/73 (11/17 0500) SpO2:  [93 %-96 %] 96 % (11/17 0500) Weight:  [113.7 kg (250 lb 10.6 oz)] 113.7 kg (250 lb 10.6 oz) (11/16 1212)    Intake/Output from previous day: 11/16 0701 - 11/17 0700 In: 2450 [P.O.:1500; I.V.:950] Out: 2905 [Urine:2775; Drains:105; Blood:25] Intake/Output this shift: No intake/output data recorded.  General appearance: alert and cooperative Resp: clear to auscultation bilaterally Cardio: regular rate and rhythm GI: soft, appropriately tender. incision looks good. drain output serosanguinous  Lab Results:  Recent Labs    05/16/17 0406  WBC 8.3  HGB 12.5  HCT 39.6  PLT 159   BMET Recent Labs    05/16/17 0406  NA 139  K 3.8  CL 101  CO2 29  GLUCOSE 138*  BUN 9  CREATININE 0.69  CALCIUM 8.9   PT/INR No results for input(s): LABPROT, INR in the last 72 hours. ABG No results for input(s): PHART, HCO3 in the last 72 hours.  Invalid input(s): PCO2, PO2  Studies/Results: No results found.  Anti-infectives: Anti-infectives (From admission, onward)   Start     Dose/Rate Route Frequency Ordered Stop   05/16/17 1000  valACYclovir (VALTREX) tablet 500 mg     500 mg Oral Daily 05/15/17 1225     05/15/17 0559  ceFAZolin (ANCEF) 2-4 GM/100ML-% IVPB    Comments:  Ernesta Amble   : cabinet override      05/15/17 0559 05/15/17 0745   05/15/17 0554  ceFAZolin (ANCEF) IVPB 2g/100 mL premix     2 g 200 mL/hr over 30 Minutes Intravenous On call to O.R. 05/15/17 0554 05/15/17 0745      Assessment/Plan: s/p Procedure(s): VENTRAL HERNIA REPAIR WITH MESH (N/A) INSERTION OF MESH (N/A) Advance diet  Ambulate D/c foley  LOS: 1 day    TOTH III,PAUL  S 05/16/2017

## 2017-05-17 DIAGNOSIS — E119 Type 2 diabetes mellitus without complications: Secondary | ICD-10-CM | POA: Diagnosis not present

## 2017-05-17 DIAGNOSIS — Z7984 Long term (current) use of oral hypoglycemic drugs: Secondary | ICD-10-CM | POA: Diagnosis not present

## 2017-05-17 DIAGNOSIS — Z6841 Body Mass Index (BMI) 40.0 and over, adult: Secondary | ICD-10-CM | POA: Diagnosis not present

## 2017-05-17 DIAGNOSIS — K439 Ventral hernia without obstruction or gangrene: Secondary | ICD-10-CM | POA: Diagnosis not present

## 2017-05-17 DIAGNOSIS — J449 Chronic obstructive pulmonary disease, unspecified: Secondary | ICD-10-CM | POA: Diagnosis not present

## 2017-05-17 DIAGNOSIS — I10 Essential (primary) hypertension: Secondary | ICD-10-CM | POA: Diagnosis not present

## 2017-05-17 DIAGNOSIS — Z7982 Long term (current) use of aspirin: Secondary | ICD-10-CM | POA: Diagnosis not present

## 2017-05-17 DIAGNOSIS — I2510 Atherosclerotic heart disease of native coronary artery without angina pectoris: Secondary | ICD-10-CM | POA: Diagnosis not present

## 2017-05-17 LAB — GLUCOSE, CAPILLARY
Glucose-Capillary: 159 mg/dL — ABNORMAL HIGH (ref 65–99)
Glucose-Capillary: 194 mg/dL — ABNORMAL HIGH (ref 65–99)
Glucose-Capillary: 147 mg/dL — ABNORMAL HIGH (ref 65–99)
Glucose-Capillary: 143 mg/dL — ABNORMAL HIGH (ref 65–99)

## 2017-05-17 NOTE — Plan of Care (Signed)
  Clinical Measurements: Postoperative complications will be avoided or minimized 05/17/2017 1145 - Progressing by Mayra Neer D, RN   Skin Integrity: Demonstration of wound healing without infection will improve 05/17/2017 1145 - Progressing by Gardner Candle, RN

## 2017-05-17 NOTE — Progress Notes (Signed)
2 Days Post-Op   Subjective/Chief Complaint: Soreness seems to be improving. No flatus yet   Objective: Vital signs in last 24 hours: Temp:  [98.2 F (36.8 C)-100.3 F (37.9 C)] 98.6 F (37 C) (11/18 0508) Pulse Rate:  [89-117] 89 (11/18 0508) Resp:  [18-20] 19 (11/18 0508) BP: (122-135)/(47-59) 122/47 (11/18 0508) SpO2:  [89 %-97 %] 96 % (11/18 0508) Last BM Date: 05/15/17  Intake/Output from previous day: 11/17 0701 - 11/18 0700 In: -  Out: 360 [Urine:300; Drains:60] Intake/Output this shift: No intake/output data recorded.  General appearance: alert and cooperative Resp: clear to auscultation bilaterally Cardio: regular rate and rhythm GI: soft, appropriately tender. incision looks good  Lab Results:  Recent Labs    05/16/17 0406  WBC 8.3  HGB 12.5  HCT 39.6  PLT 159   BMET Recent Labs    05/16/17 0406  NA 139  K 3.8  CL 101  CO2 29  GLUCOSE 138*  BUN 9  CREATININE 0.69  CALCIUM 8.9   PT/INR No results for input(s): LABPROT, INR in the last 72 hours. ABG No results for input(s): PHART, HCO3 in the last 72 hours.  Invalid input(s): PCO2, PO2  Studies/Results: No results found.  Anti-infectives: Anti-infectives (From admission, onward)   Start     Dose/Rate Route Frequency Ordered Stop   05/16/17 1000  valACYclovir (VALTREX) tablet 500 mg     500 mg Oral Daily 05/15/17 1225     05/15/17 0559  ceFAZolin (ANCEF) 2-4 GM/100ML-% IVPB    Comments:  Ernesta Amble   : cabinet override      05/15/17 0559 05/15/17 0745   05/15/17 0554  ceFAZolin (ANCEF) IVPB 2g/100 mL premix     2 g 200 mL/hr over 30 Minutes Intravenous On call to O.R. 05/15/17 0554 05/15/17 0745      Assessment/Plan: s/p Procedure(s): VENTRAL HERNIA REPAIR WITH MESH (N/A) INSERTION OF MESH (N/A) Advance diet  Ambulate POD 2  Await return of bowel funcition  LOS: 1 day    TOTH III,PAUL S 05/17/2017

## 2017-05-18 ENCOUNTER — Other Ambulatory Visit: Payer: Self-pay | Admitting: Family Medicine

## 2017-05-18 ENCOUNTER — Encounter (HOSPITAL_COMMUNITY): Payer: Self-pay | Admitting: General Surgery

## 2017-05-18 DIAGNOSIS — Z7982 Long term (current) use of aspirin: Secondary | ICD-10-CM | POA: Diagnosis not present

## 2017-05-18 DIAGNOSIS — E119 Type 2 diabetes mellitus without complications: Secondary | ICD-10-CM | POA: Diagnosis not present

## 2017-05-18 DIAGNOSIS — K439 Ventral hernia without obstruction or gangrene: Secondary | ICD-10-CM | POA: Diagnosis not present

## 2017-05-18 DIAGNOSIS — J449 Chronic obstructive pulmonary disease, unspecified: Secondary | ICD-10-CM | POA: Diagnosis not present

## 2017-05-18 DIAGNOSIS — Z7984 Long term (current) use of oral hypoglycemic drugs: Secondary | ICD-10-CM | POA: Diagnosis not present

## 2017-05-18 DIAGNOSIS — I10 Essential (primary) hypertension: Secondary | ICD-10-CM | POA: Diagnosis not present

## 2017-05-18 DIAGNOSIS — Z6841 Body Mass Index (BMI) 40.0 and over, adult: Secondary | ICD-10-CM | POA: Diagnosis not present

## 2017-05-18 DIAGNOSIS — I2510 Atherosclerotic heart disease of native coronary artery without angina pectoris: Secondary | ICD-10-CM | POA: Diagnosis not present

## 2017-05-18 LAB — GLUCOSE, CAPILLARY
Glucose-Capillary: 140 mg/dL — ABNORMAL HIGH (ref 65–99)
Glucose-Capillary: 157 mg/dL — ABNORMAL HIGH (ref 65–99)

## 2017-05-18 MED ORDER — HYDROCODONE-ACETAMINOPHEN 5-325 MG PO TABS: 1.0000 | ORAL_TABLET | Freq: Four times a day (QID) | ORAL | 0 refills | Status: DC | PRN

## 2017-05-18 NOTE — Progress Notes (Signed)
Patient discharged to home with instructions and prescription. 

## 2017-05-18 NOTE — Progress Notes (Signed)
3 Days Post-Op   Subjective/Chief Complaint: Passed some gas. Wants to go home   Objective: Vital signs in last 24 hours: Temp:  [98.4 F (36.9 C)-98.6 F (37 C)] 98.6 F (37 C) (11/19 0439) Pulse Rate:  [87-99] 99 (11/19 0439) Resp:  [16-19] 18 (11/19 0439) BP: (125-137)/(48-70) 137/70 (11/19 0439) SpO2:  [91 %-94 %] 91 % (11/19 0439) Last BM Date: 05/15/17(prior surgery)  Intake/Output from previous day: 11/18 0701 - 11/19 0700 In: 120 [P.O.:120] Out: 10 [Drains:10] Intake/Output this shift: No intake/output data recorded.  General appearance: alert and cooperative Resp: clear to auscultation bilaterally Cardio: regular rate and rhythm GI: soft, mild tenderness. incision ok  Lab Results:  Recent Labs    05/16/17 0406  WBC 8.3  HGB 12.5  HCT 39.6  PLT 159   BMET Recent Labs    05/16/17 0406  NA 139  K 3.8  CL 101  CO2 29  GLUCOSE 138*  BUN 9  CREATININE 0.69  CALCIUM 8.9   PT/INR No results for input(Schultz): LABPROT, INR in the last 72 hours. ABG No results for input(Schultz): PHART, HCO3 in the last 72 hours.  Invalid input(Schultz): PCO2, PO2  Studies/Results: No results found.  Anti-infectives: Anti-infectives (From admission, onward)   Start     Dose/Rate Route Frequency Ordered Stop   05/16/17 1000  valACYclovir (VALTREX) tablet 500 mg     500 mg Oral Daily 05/15/17 1225     05/15/17 0559  ceFAZolin (ANCEF) 2-4 GM/100ML-% IVPB    Comments:  Ernesta Amble   : cabinet override      05/15/17 0559 05/15/17 0745   05/15/17 0554  ceFAZolin (ANCEF) IVPB 2g/100 mL premix     2 g 200 mL/hr over 30 Minutes Intravenous On call to O.R. 05/15/17 0554 05/15/17 0745      Assessment/Plan: Schultz/p Procedure(Schultz): VENTRAL HERNIA REPAIR WITH MESH (N/A) INSERTION OF MESH (N/A) Advance diet Discharge  LOS: 1 day    TOTH III,Shannon Schultz 05/18/2017

## 2017-05-25 ENCOUNTER — Encounter (HOSPITAL_COMMUNITY): Payer: Self-pay | Admitting: General Surgery

## 2017-05-26 ENCOUNTER — Encounter: Payer: Self-pay | Admitting: Family Medicine

## 2017-05-26 ENCOUNTER — Other Ambulatory Visit: Payer: Self-pay | Admitting: Family Medicine

## 2017-05-26 MED ORDER — VALACYCLOVIR HCL 500 MG PO TABS: 500.0000 mg | ORAL_TABLET | Freq: Every day | ORAL | 0 refills | Status: DC

## 2017-06-04 DIAGNOSIS — J441 Chronic obstructive pulmonary disease with (acute) exacerbation: Secondary | ICD-10-CM | POA: Diagnosis not present

## 2017-06-04 DIAGNOSIS — J449 Chronic obstructive pulmonary disease, unspecified: Secondary | ICD-10-CM | POA: Diagnosis not present

## 2017-06-15 DIAGNOSIS — M25511 Pain in right shoulder: Secondary | ICD-10-CM | POA: Diagnosis not present

## 2017-06-15 DIAGNOSIS — S46811A Strain of other muscles, fascia and tendons at shoulder and upper arm level, right arm, initial encounter: Secondary | ICD-10-CM | POA: Diagnosis not present

## 2017-06-22 ENCOUNTER — Other Ambulatory Visit: Payer: Self-pay | Admitting: Family Medicine

## 2017-06-24 ENCOUNTER — Other Ambulatory Visit: Payer: Self-pay | Admitting: Family Medicine

## 2017-07-03 ENCOUNTER — Telehealth: Payer: Self-pay | Admitting: Pulmonary Disease

## 2017-07-03 NOTE — Telephone Encounter (Addendum)
Spoke with patient regarding samples of trilogy Advised patient of PM note on last ROV 01/14/17 Instructions   We will stop the trelegy and give you samples of stiolto inhaler inhaler. Please let us know if this works for you and we will call in the prescription  Return in 6 months      After Visit Summary (Printed 01/26/2017)   Pt advised that Trelegy works well for her, and would like to continue therapy on trelegy not stiolto.  Patient is currently out of Trelegy at this time, needing sample today.  MW please advise as PM is out of office.

## 2017-07-03 NOTE — Telephone Encounter (Signed)
[place] for sample but needs to regroup with [place] before this runs out to sort out what needs longterm Either Dr Vaughan Browner or NP Be sure she did not also continue stiolto as this would be redundant

## 2017-07-03 NOTE — Telephone Encounter (Signed)
Spoke with pt, advised samples are up front ready to pick up. She has an appt with PM on 1/14 and she plans to come in. Nothing further is needed.

## 2017-07-05 DIAGNOSIS — J441 Chronic obstructive pulmonary disease with (acute) exacerbation: Secondary | ICD-10-CM | POA: Diagnosis not present

## 2017-07-05 DIAGNOSIS — J449 Chronic obstructive pulmonary disease, unspecified: Secondary | ICD-10-CM | POA: Diagnosis not present

## 2017-07-13 ENCOUNTER — Encounter: Payer: Self-pay | Admitting: Pulmonary Disease

## 2017-07-13 ENCOUNTER — Ambulatory Visit: Payer: Medicare HMO | Admitting: Pulmonary Disease

## 2017-07-13 VITALS — BP 152/72 | HR 85 | Ht 64.5 in | Wt 246.6 lb

## 2017-07-13 DIAGNOSIS — Z87891 Personal history of nicotine dependence: Secondary | ICD-10-CM

## 2017-07-13 DIAGNOSIS — J449 Chronic obstructive pulmonary disease, unspecified: Secondary | ICD-10-CM | POA: Diagnosis not present

## 2017-07-13 NOTE — Progress Notes (Addendum)
Shannon Schultz    [id number]    [birth year]  Primary Care Physician:Knapp, Tera Helper, MD  Referring Physician: [name], [address] [place], [place] [address]  Chief complaint:  Follow up for COPD GOLD B  HPI: Mrs. Pester is a 72 year old with COPD, moderate obstructive defect. She was previously seen by Dr. Melvyn Novas in 2013. She has been maintained on Symbicort since 2013. She was hospitalized in February 2017 for the flu and COPD exacerbation. Switched to trelegy inhaler in 2018 with good response.   Interim History: She has had an eventful year in 2018.  She lost her sister to breast cancer in early 2018, was diagnosed with breast cancer s/p lumpectomy, had bladder cancer and underwent resection and intravesicular chemo, fell and broke her right shoulder, underwent ventral hernia repair with mesh insertion.  She follows with [facility] and was told that there may be a fluid collection around the mesh and is on Keflex for this.  On the bright side she continues to be stable with regard to her breathing. Got a dose of doxy and prednisone for COPD exacerbation in Nov 2018. Continues on trelegy inhaler which helps a lot with her symptoms.  She still has occasional hoarseness of voice with the trelegy but feels she can tolerate this.  Finished pulmonary rehabilitation in 2018.  Continues on supplemental oxygen with ambulation.  Outpatient Encounter Medications as of 07/13/2017  Medication Sig  . acetaminophen (TYLENOL) 500 MG tablet Take 1,000 mg every 6 (six) hours as needed by mouth for moderate pain or headache.   . albuterol (PROVENTIL HFA;VENTOLIN HFA) 108 (90 Base) MCG/ACT inhaler Inhale 2 puffs every 6 (six) hours as needed into the lungs for wheezing or shortness of breath.  Marland Kitchen albuterol (PROVENTIL) (2.5 MG/3ML) 0.083% nebulizer solution USE 1 VIAL BY NEBULIZATION EVERY FOUR HOURS AS NEEDED FOR WHEEZING OR SHORTNESS OF BREATH.  Marland Kitchen aspirin EC 81 MG tablet Take 81 mg daily by  mouth.  . bisoprolol-hydrochlorothiazide (ZIAC) 10-6.25 MG tablet TAKE 1 TABLET EVERY DAY  . Calcium Carbonate-Vitamin D (CALCIUM 600+D) 600-400 MG-UNIT per tablet Take 1 tablet by mouth daily.   . cephALEXin (KEFLEX) 500 MG capsule Take 500 mg by mouth 4 (four) times daily.  . cetirizine (KLS ALLER-TEC) 10 MG tablet Take 10 mg by mouth daily.  . Cholecalciferol (EQL VITAMIN D3) 2000 units CAPS Take 4,000 Units daily by mouth.  . fluticasone (FLONASE) 50 MCG/ACT nasal spray Place 2 sprays into both nostrils daily. (Patient taking differently: Place 2 sprays at bedtime into both nostrils. )  . Fluticasone-Umeclidin-Vilant (TRELEGY ELLIPTA) 100-62.5-25 MCG/INH AEPB Inhale 1 puff into the lungs daily. (Patient taking differently: Inhale 2 puffs daily into the lungs. )  . furosemide (LASIX) 20 MG tablet Take 20 mg daily as needed by mouth for fluid or edema.   Marland Kitchen HYDROcodone-acetaminophen (NORCO/VICODIN) 5-325 MG tablet Take 1-2 tablets every 6 (six) hours as needed by mouth for moderate pain.  . metFORMIN (GLUCOPHAGE-XR) 500 MG 24 hr tablet TAKE 1 TABLET THREE TIMES DAILY (Patient taking differently: Take 500 mg by mouth 3 times daily)  . Multiple Vitamins-Minerals (CENTRUM SILVER PO) Take 1 tablet by mouth daily.   . simvastatin (ZOCOR) 20 MG tablet TAKE 1 TABLET AT BEDTIME  . triamcinolone cream (KENALOG) 0.1 % Apply 1 application 2 (two) times daily as needed topically (for dark red areas on leg).  . TRUEPLUS LANCETS 33G MISC TEST ONE TIME DAILY  . valACYclovir (  VALTREX) 500 MG tablet TAKE 1 TABLET BY MOUTH EVERY DAY  . valACYclovir (VALTREX) 500 MG tablet TAKE 1 TABLET BY MOUTH EVERY DAY   No facility-administered encounter medications on file as of 07/13/2017.     Allergies as of 07/13/2017 - Review Complete 07/13/2017  Allergen Reaction Noted  . Contrast media [iodinated diagnostic agents] Anaphylaxis, Shortness Of Breath, and Other (See Comments) 06/06/2011  . Iohexol Anaphylaxis, Shortness  Of Breath, and Other (See Comments) 06/25/2009  . Lisinopril Anaphylaxis, Shortness Of Breath, and Rash 05/15/2014  . Sulfa antibiotics Anaphylaxis and Other (See Comments) 11/11/2010  . Latex Other (See Comments) 06/25/2011  . Codeine Nausea Only, Anxiety, and Other (See Comments) 11/11/2010  . Levofloxacin Other (See Comments) 11/11/2010  . Lipitor [atorvastatin calcium] Rash 11/11/2010    Past Medical History:  Diagnosis Date  . Aortic stenosis    mild-mod by 04/08/16 echo; mod AS 04/2017  . Arthritis    "hands" (05/15/2017)  . Bladder cancer ([place] [facility]) 2018  . Breast cancer, right ([place]) 1992   DCIS,bladder ca (just dx)  . Colon polyp   . Complication of anesthesia 1992   "local anesthesia" used was hard to awaken from-no problems since (05/15/2017)  . COPD (chronic obstructive pulmonary disease) ([place])   . Diverticulosis   . Dyspnea   . Elevated cholesterol   . Elevated liver enzymes    fatty liver per ultrasound per pt  . Environmental allergies    "I have allergies year round" (05/15/2017)  . Family history of malignant neoplasm of breast   . FHx: BRCA2 gene positive    sister with BRCA2 mutation (pt tested NEGATIVE)  . Genital HSV    gets on hip  . Heart murmur     echo 05/2011 mild aortic stenosis  . HSV (herpes simplex virus) infection    on hip--on daily suppression  . Hypertension   . Hypothyroidism    took med 7 yrs after birth of 1st child  . Impaired glucose tolerance   . Migraine   . On home oxygen therapy    "have it available but I'm not using it" (05/15/2017)  . Osteopenia   . Pneumonia ~ 1950; 08/06/2010  . Type 2 diabetes mellitus ([facility])   . Ventral hernia   . Vitamin D deficiency disease     Past Surgical History:  Procedure Laterality Date  . ABDOMINAL HERNIA REPAIR  05/15/2017  . BREAST BIOPSY Right 1992  . BREAST BIOPSY Left 2018  . BREAST LUMPECTOMY WITH RADIOACTIVE SEED LOCALIZATION Left 12/22/2016   Procedure: LEFT BREAST LUMPECTOMY WITH  RADIOACTIVE SEED LOCALIZATION;  Surgeon: Jovita Kussmaul, MD;  Location: [facility];  Service: General;  Laterality: Left;  . COLONOSCOPY  2009, 08/2010, 12/2015   Dr. Collene Mares; "only 1 had any polyps" (05/15/2017)  . CYSTOSCOPY  04/23/2017  . CYSTOSCOPY W/ RETROGRADES Bilateral 01/12/2017   Procedure: CYSTOSCOPY WITH RETROGRADE PYELOGRAM/ EXAM UNDER ANESTHESIA;  Surgeon: Raynelle Bring, MD;  Location: [facility];  Service: Urology;  Laterality: Bilateral;  . HERNIA REPAIR    . INSERTION OF MESH N/A 05/15/2017   Procedure: INSERTION OF MESH;  Surgeon: Jovita Kussmaul, MD;  Location: [facility];  Service: General;  Laterality: N/A;  . LAPAROSCOPIC CHOLECYSTECTOMY  2003  . MASTECTOMY Right 1992  . TRANSURETHRAL RESECTION OF BLADDER TUMOR WITH MITOMYCIN-C N/A 01/12/2017   Procedure: TRANSURETHRAL RESECTION OF BLADDER TUMOR WITH POSSIBLE POST OPERATIVE INSTILLATION OF MITOMYCIN-C;  Surgeon: Raynelle Bring, MD;  Location: [facility];  Service: Urology;  Laterality: N/A;  . TYMPANOSTOMY TUBE PLACEMENT Bilateral 1980s  . UMBILICAL HERNIA REPAIR  2003  . VENTRAL HERNIA REPAIR N/A 05/15/2017   Procedure: VENTRAL HERNIA REPAIR WITH MESH;  Surgeon: Jovita Kussmaul, MD;  [facility];  Service: General;  Laterality: N/A;    Family History  Problem Relation Age of Onset  . Heart disease Father   . Diabetes Father   . Hypertension Father   . Asthma Sister   . Allergies Sister   . Hyperlipidemia Sister   . Hashimoto's thyroiditis Sister   . Fibromyalgia Sister   . Heart disease Mother        tachycardia  . Asthma Sister   . Allergies Sister   . Hyperlipidemia Sister   . Hashimoto's thyroiditis Sister   . Fibromyalgia Sister   . Breast cancer Sister 22       BRCA2 positive; metastatic to bones at age 50, then spread to liver  . Cancer Other        female cancers, bone cancer  . Cancer Maternal Grandmother        deceased 36; unk. primary; possibly stomach  . Tuberculosis Maternal Grandfather   . Stroke Paternal  Grandmother 42       died of cerebral hemorrhage    Social History   Socioeconomic History  . Marital status: Married    Spouse name: Not on file  . Number of children: 3  . Years of education: Not on file  . Highest education level: Not on file  Social Needs  . Financial resource strain: Not on file  . Food insecurity - worry: Not on file  . Food insecurity - inability: Not on file  . Transportation needs - medical: Not on file  . Transportation needs - non-medical: Not on file  Occupational History  . Occupation: Retired  Tobacco Use  . Smoking status: Former Smoker    Packs/day: 1.00    Years: 46.00    Pack years: 46.00    Types: Cigarettes    Last attempt to quit: 05/31/2011    Years since quitting: 6.1  . Smokeless tobacco: Never Used  Substance and Sexual Activity  . Alcohol use: No    Comment: 05/15/2017 "1 drink per year maybe"  . Drug use: No  . Sexual activity: Not Currently    Partners: Male    Birth control/protection: Post-menopausal  Other Topics Concern  . Not on file  Social History Narrative   Lives with her husband.  Children all live in [place] nearby. No pets. 6 grandchildren.    Review of systems: Review of Systems  Constitutional: Negative for fever and chills.  HENT: Negative.   Eyes: Negative for blurred vision.  Respiratory: as per HPI  Cardiovascular: Negative for chest pain and palpitations.  Gastrointestinal: Negative for vomiting, diarrhea, blood per rectum. Genitourinary: Negative for dysuria, urgency, frequency and hematuria.  Musculoskeletal: Negative for myalgias, back pain and joint pain.  Skin: Negative for itching and rash.  Neurological: Negative for dizziness, tremors, focal weakness, seizures and loss of consciousness.  Endo/Heme/Allergies: Negative for environmental allergies.  Psychiatric/Behavioral: Negative for depression, suicidal ideas and hallucinations.  All other systems reviewed and are negative.  Physical  Exam: Blood pressure (!) 152/72, pulse 85, height 5' 4.5" (1.638 m), weight 246 lb 9.6 oz (111.9 kg), SpO2 91 %. Gen:      No acute distress HEENT:  EOMI, sclera anicteric Neck:     No masses; no thyromegaly Lungs:  Clear to auscultation bilaterally; normal respiratory effort CV:         Regular rate and rhythm; no murmurs Abd:      + bowel sounds; soft, non-tender; no palpable masses, no distension Ext:    No edema; adequate peripheral perfusion Skin:      Warm and dry; no rash Neuro: alert and oriented x 3 Psych: normal mood and affect  Data Reviewed: Chest x-ray 08/16/15-hyperinflation consistent with COPD. No acute pulmonary abnormality. Chest x-ray 9//16/16-no acute cardio pulmonary abnormality Screening CT chest 09/10/16 -moderate emphysema with no suspicious lung nodule or mass. Chest x-ray 04/22/17-clear lungs. All images personally reviewed  PFTs 08/28/11 FVC 2.71 [92%], FEV1 1.33 (62%), F/F 49, TLC 114%, DLCO 46% Moderate obstructive defect with moderate reduction in diffusion capacity. No bronchodilator response  11/08/15 FVC 2.05 [7%), FEV1 1.23 (55%), F/F 60 Moderate obstructive defect  09/24/16 FVC 2. (82%), FEV1 1.43 (62%), F/F 58 , TLC 99%, DLCO 54% Moderate obstruction with moderate diffusion defect. No bronchodilator response.  A1AT 08/08/16- 162, PIMM  Assessment:  COPD GOLD B She has responded well to the Trelogy inhaler. Continue the same. Give coupons for assistance with cost of medications A1AT levels are normal and screening CT is ok.  Finished pulmonary rehab. Continue supplemental O2 with ambulation. Continue low dose screening CT  Plan/Recommendations: - Continue trelegy, supplemental O2 - Continue low dose screening CT   Marshell Garfinkel MD [facility] Pager [phone] 07/13/2017, 12:32 PM  CC: [name], MD

## 2017-07-13 NOTE — Patient Instructions (Signed)
Continue the trelegy.  We will see if we have coupons to help bring down the cost of the medication Continue supplemental oxygen Follow-up in 6 months.

## 2017-07-15 ENCOUNTER — Other Ambulatory Visit (HOSPITAL_COMMUNITY): Payer: Self-pay | Admitting: General Surgery

## 2017-07-15 DIAGNOSIS — S301XXA Contusion of abdominal wall, initial encounter: Secondary | ICD-10-CM

## 2017-07-20 ENCOUNTER — Other Ambulatory Visit: Payer: Self-pay | Admitting: Family Medicine

## 2017-07-27 DIAGNOSIS — M24111 Other articular cartilage disorders, right shoulder: Secondary | ICD-10-CM | POA: Diagnosis not present

## 2017-07-27 DIAGNOSIS — S46011A Strain of muscle(s) and tendon(s) of the rotator cuff of right shoulder, initial encounter: Secondary | ICD-10-CM | POA: Diagnosis not present

## 2017-07-27 DIAGNOSIS — M25411 Effusion, right shoulder: Secondary | ICD-10-CM | POA: Diagnosis not present

## 2017-07-29 ENCOUNTER — Ambulatory Visit (HOSPITAL_COMMUNITY)
Admission: RE | Admit: 2017-07-29 | Discharge: 2017-07-29 | Disposition: A | Discharge status: Home / Self Care | Payer: Medicare HMO | Source: Ambulatory Visit | Attending: General Surgery | Admitting: General Surgery

## 2017-07-29 ENCOUNTER — Other Ambulatory Visit: Payer: Self-pay | Admitting: Family Medicine

## 2017-07-29 DIAGNOSIS — T888XXA Other specified complications of surgical and medical care, not elsewhere classified, initial encounter: Secondary | ICD-10-CM | POA: Insufficient documentation

## 2017-07-29 DIAGNOSIS — S301XXA Contusion of abdominal wall, initial encounter: Secondary | ICD-10-CM

## 2017-07-29 DIAGNOSIS — X58XXXA Exposure to other specified factors, initial encounter: Secondary | ICD-10-CM | POA: Diagnosis not present

## 2017-07-29 DIAGNOSIS — T792XXA Traumatic secondary and recurrent hemorrhage and seroma, initial encounter: Secondary | ICD-10-CM | POA: Diagnosis not present

## 2017-07-29 DIAGNOSIS — M7981 Nontraumatic hematoma of soft tissue: Secondary | ICD-10-CM | POA: Diagnosis not present

## 2017-07-29 MED ORDER — LIDOCAINE HCL (PF) 1 % IJ SOLN
INTRAMUSCULAR | Status: DC
Start: 2017-07-29 — End: 2017-07-30
  Filled 2017-07-29: qty 30

## 2017-07-31 DIAGNOSIS — M7541 Impingement syndrome of right shoulder: Secondary | ICD-10-CM | POA: Diagnosis not present

## 2017-08-04 ENCOUNTER — Telehealth: Payer: Self-pay | Admitting: Cardiology

## 2017-08-04 NOTE — Telephone Encounter (Signed)
New message        [facility] Pre-operative Risk Assessment    Request for surgical clearance:  1. What type of surgery is being performed? right rotator cuff repair  When is this surgery scheduled? 2/21 2. What type of clearance is required (medical clearance vs. Pharmacy clearance to hold med vs. Both)?medical  3. Are there any medications that need to be held prior to surgery and how long?n/a  4. Practice name and name of physician performing surgery? Dr Rhona Raider  5. What is your office phone and fax number? fx [phone], phone [phone]  6. Anesthesia type (None, local, MAC, general) ? choice   Laurier Nancy 08/04/2017, 2:51 PM  _________________________________________________________________   (provider comments below)

## 2017-08-05 ENCOUNTER — Encounter: Payer: Self-pay | Admitting: Family Medicine

## 2017-08-05 DIAGNOSIS — J449 Chronic obstructive pulmonary disease, unspecified: Secondary | ICD-10-CM | POA: Diagnosis not present

## 2017-08-05 DIAGNOSIS — J441 Chronic obstructive pulmonary disease with (acute) exacerbation: Secondary | ICD-10-CM | POA: Diagnosis not present

## 2017-08-05 NOTE — Telephone Encounter (Signed)
Faxed preop letter and verified by office staff notes were received.  

## 2017-08-05 NOTE — Telephone Encounter (Signed)
   Primary Cardiologist: Candee Furbish, MD  Chart reviewed as part of pre-operative protocol coverage. Patient was contacted 08/05/2017 in reference to pre-operative risk assessment for pending surgery as outlined below.  Shannon Schultz was last seen on 04/30/17 by Dr. Marlou Porch.  Since that day, Shannon Schultz has done well.  She can complete more than 4.0 METS for this  Moderate risk surgery.  Therefore, based on ACC/AHA guidelines, the patient would be at acceptable risk for the planned procedure without further cardiovascular testing.   I will route this recommendation to the requesting party via Epic fax function and remove from pre-op pool.  Preop call back pool, please call office and make sure they received this documentation.  Please call with questions.  [name], PA 08/05/2017, 1:52 PM

## 2017-08-06 ENCOUNTER — Ambulatory Visit (INDEPENDENT_AMBULATORY_CARE_PROVIDER_SITE_OTHER): Payer: Medicare HMO | Admitting: Family Medicine

## 2017-08-06 ENCOUNTER — Encounter: Payer: Self-pay | Admitting: Family Medicine

## 2017-08-06 VITALS — BP 136/80 | HR 80 | Temp 100.4°F | Ht 64.5 in | Wt 244.6 lb

## 2017-08-06 DIAGNOSIS — L03115 Cellulitis of right lower limb: Secondary | ICD-10-CM

## 2017-08-06 DIAGNOSIS — I872 Venous insufficiency (chronic) (peripheral): Secondary | ICD-10-CM | POA: Diagnosis not present

## 2017-08-06 MED ORDER — DOXYCYCLINE HYCLATE 100 MG PO TABS: 100.0000 mg | ORAL_TABLET | Freq: Two times a day (BID) | ORAL | 0 refills | Status: DC

## 2017-08-06 NOTE — Patient Instructions (Signed)
Continue to take lasix 20mg  for the next day or two to help with swelling. Try and keep the legs elevated above the heart. Consider compression socks/stocking as the pain improves, when you are able to put them on. Take the doxycycline twice daily with food. Return in 7-10 days day for recheck, sooner if not tolerating the medication, spreading of the redness beyond the inked area, any vomiting, or other new symptoms.   Cellulitis, Adult Cellulitis is a skin infection. The infected area is usually red and sore. This condition occurs most often in the arms and lower legs. It is very important to get treated for this condition. Follow these instructions at home:  Take over-the-counter and prescription medicines only as told by your doctor.  If you were prescribed an antibiotic medicine, take it as told by your doctor. Do not stop taking the antibiotic even if you start to feel better.  Drink enough fluid to keep your pee (urine) clear or pale yellow.  Do not touch or rub the infected area.  Raise (elevate) the infected area above the level of your heart while you are sitting or lying down.  Place warm or cold wet cloths (warm or cold compresses) on the infected area. Do this as told by your doctor.  Keep all follow-up visits as told by your doctor. This is important. These visits let your doctor make sure your infection is not getting worse. Contact a doctor if:  You have a fever.  Your symptoms do not get better after 1-2 days of treatment.  Your bone or joint under the infected area starts to hurt after the skin has healed.  Your infection comes back. This can happen in the same area or another area.  You have a swollen bump in the infected area.  You have new symptoms.  You feel ill and also have muscle aches and pains. Get help right away if:  Your symptoms get worse.  You feel very sleepy.  You throw up (vomit) or have watery poop (diarrhea) for a long time.  There  are red streaks coming from the infected area.  Your red area gets larger.  Your red area turns darker. This information is not intended to replace advice given to you by your health care provider. Make sure you discuss any questions you have with your health care provider. Document Released: 12/03/2007 Document Revised: 11/22/2015 Document Reviewed: 04/25/2015 Elsevier Interactive Patient Education  2018 [employer].   Chronic Venous Insufficiency Chronic venous insufficiency, also called venous stasis, is a condition that prevents blood from being pumped effectively through the veins in your legs. Blood may no longer be pumped effectively from the legs back to the heart. This condition can range from mild to severe. With proper treatment, you should be able to continue with an active life. What are the causes? Chronic venous insufficiency occurs when the vein walls become stretched, weakened, or damaged, or when valves within the vein are damaged. Some common causes of this include:  High blood pressure inside the veins (venous hypertension).  Increased blood pressure in the leg veins from long periods of sitting or standing.  A blood clot that blocks blood flow in a vein (deep vein thrombosis, DVT).  Inflammation of a vein (phlebitis) that causes a blood clot to form.  Tumors in the pelvis that cause blood to back up.  What increases the risk? The following factors may make you more likely to develop this condition:  Having a family  history of this condition.  Obesity.  Pregnancy.  Living without enough physical activity or exercise (sedentary lifestyle).  Smoking.  Having a job that requires long periods of standing or sitting in one place.  Being a certain age. Women in their 21s and 35s and men in their 53s are more likely to develop this condition.  What are the signs or symptoms? Symptoms of this condition include:  Veins that are enlarged, bulging, or twisted  (varicose veins).  Skin breakdown or ulcers.  Reddened or discolored skin on the front of the leg.  Brown, smooth, tight, and painful skin just above the ankle, usually on the inside of the leg (lipodermatosclerosis).  Swelling.  How is this diagnosed? This condition may be diagnosed based on:  Your medical history.  A physical exam.  Tests, such as: ? A procedure that creates an image of a blood vessel and nearby organs and provides information about blood flow through the blood vessel (duplex ultrasound). ? A procedure that tests blood flow (plethysmography). ? A procedure to look at the veins using X-ray and dye (venogram).  How is this treated? The goals of treatment are to help you return to an active life and to minimize pain or disability. Treatment depends on the severity of your condition, and it may include:  Wearing compression stockings. These can help relieve symptoms and help prevent your condition from getting worse. However, they do not cure the condition.  Sclerotherapy. This is a procedure involving an injection of a material that "dissolves" damaged veins.  Surgery. This may involve: ? Removing a diseased vein (vein stripping). ? Cutting off blood flow through the vein (laser ablation surgery). ? Repairing a valve.  Follow these instructions at home:  Wear compression stockings as told by your health care provider. These stockings help to prevent blood clots and reduce swelling in your legs.  Take over-the-counter and prescription medicines only as told by your health care provider.  Stay active by exercising, walking, or doing different activities. Ask your health care provider what activities are safe for you and how much exercise you need.  Drink enough fluid to keep your urine clear or pale yellow.  Do not use any products that contain nicotine or tobacco, such as cigarettes and e-cigarettes. If you need help quitting, ask your health care  provider.  Keep all follow-up visits as told by your health care provider. This is important. Contact a health care provider if:  You have redness, swelling, or more pain in the affected area.  You see a red streak or line that extends up or down from the affected area.  You have skin breakdown or a loss of skin in the affected area, even if the breakdown is small.  You get an injury in the affected area. Get help right away if:  You get an injury and an open wound in the affected area.  You have severe pain that does not get better with medicine.  You have sudden numbness or weakness in the foot or ankle below the affected area, or you have trouble moving your foot or ankle.  You have a fever and you have worse or persistent symptoms.  You have chest pain.  You have shortness of breath. Summary  Chronic venous insufficiency, also called venous stasis, is a condition that prevents blood from being pumped effectively through the veins in your legs.  Chronic venous insufficiency occurs when the vein walls become stretched, weakened, or damaged, or when  valves within the vein are damaged.  Treatment for this condition depends on how severe your condition is, and it may involve wearing compression stockings or having a procedure.  Make sure you stay active by exercising, walking, or doing different activities. Ask your health care provider what activities are safe for you and how much exercise you need. This information is not intended to replace advice given to you by your health care provider. Make sure you discuss any questions you have with your health care provider. Document Released: 10/20/2006 Document Revised: 05/05/2016 Document Reviewed: 05/05/2016 Elsevier Interactive Patient Education  2017 [employer].

## 2017-08-06 NOTE — Progress Notes (Signed)
Chief Complaint  Patient presents with  . Cellulitis    right lower leg worse than left. Weeping started today but skin peeling started 3-4 days ago. Stings and burns. Has been running low grade temp.   Injured her right shoulder, can't lay down in bed, so legs started swelling. She was diagnosed with torn RC--surgery planned (2/21) Legs swelled a lot, and the skin started cracking and peeling 4-5 days ago. Also has been red x 4-5 day.  Started weeping today. She can't put compression socks on (and doesn't have any) due to her shoulder pain. Took fluid pill yesterday and today. Swelling has improved.  PA for Dr. Marlou Starks put her on Keflex last month for redness of area on right lower abdomen, that also had fluid collection.  It is much better  Had a fever a couple of nights (99.8 max). Not aware of fever today. Slight nausea at night x 2d, no vomiting or diarrhea.  PMH, PSH, SH reviewed  Outpatient Encounter Medications as of 08/06/2017  Medication Sig Note  . aspirin EC 81 MG tablet Take 81 mg daily by mouth. 05/06/2017: Stopped for procedure  . bisoprolol-hydrochlorothiazide (ZIAC) 10-6.25 MG tablet TAKE 1 TABLET EVERY DAY   . Calcium Carbonate-Vitamin D (CALCIUM 600+D) 600-400 MG-UNIT per tablet Take 1 tablet by mouth daily.  05/06/2017: Stopped for procedure  . cetirizine (KLS ALLER-TEC) 10 MG tablet Take 10 mg by mouth daily.   . Cholecalciferol (EQL VITAMIN D3) 2000 units CAPS Take 4,000 Units daily by mouth. 05/06/2017: Stopped for procedure  . fluticasone (FLONASE) 50 MCG/ACT nasal spray Place 2 sprays into both nostrils daily. (Patient taking differently: Place 2 sprays at bedtime into both nostrils. )   . Fluticasone-Umeclidin-Vilant (TRELEGY ELLIPTA) 100-62.5-25 MCG/INH AEPB Inhale 1 puff into the lungs daily. (Patient taking differently: Inhale 2 puffs daily into the lungs. )   . furosemide (LASIX) 20 MG tablet Take 20 mg daily as needed by mouth for fluid or edema.    . metFORMIN  (GLUCOPHAGE-XR) 500 MG 24 hr tablet TAKE 1 TABLET THREE TIMES DAILY (Patient taking differently: Take 500 mg by mouth 3 times daily)   . Multiple Vitamins-Minerals (CENTRUM SILVER PO) Take 1 tablet by mouth daily.  05/06/2017: Stopped for procedure  . simvastatin (ZOCOR) 20 MG tablet TAKE 1 TABLET AT BEDTIME   . triamcinolone cream (KENALOG) 0.1 % Apply 1 application 2 (two) times daily as needed topically (for dark red areas on leg).   . TRUEPLUS LANCETS 33G MISC TEST ONE TIME DAILY   . valACYclovir (VALTREX) 500 MG tablet TAKE 1 TABLET BY MOUTH EVERY DAY   . acetaminophen (TYLENOL) 500 MG tablet Take 1,000 mg every 6 (six) hours as needed by mouth for moderate pain or headache.    . albuterol (PROVENTIL HFA;VENTOLIN HFA) 108 (90 Base) MCG/ACT inhaler Inhale 2 puffs every 6 (six) hours as needed into the lungs for wheezing or shortness of breath.   Marland Kitchen albuterol (PROVENTIL) (2.5 MG/3ML) 0.083% nebulizer solution USE 1 VIAL BY NEBULIZATION EVERY FOUR HOURS AS NEEDED FOR WHEEZING OR SHORTNESS OF BREATH. (Patient not taking: Reported on 08/06/2017)   . cephALEXin (KEFLEX) 500 MG capsule Take 500 mg by mouth 4 (four) times daily. 07/13/2017: Following a hernia repair, suspected infection/pocket of fluid  . doxycycline (VIBRA-TABS) 100 MG tablet Take 1 tablet (100 mg total) by mouth 2 (two) times daily.   . [DISCONTINUED] HYDROcodone-acetaminophen (NORCO/VICODIN) 5-325 MG tablet Take 1-2 tablets every 6 (six) hours as needed by  mouth for moderate pain.   . [DISCONTINUED] valACYclovir (VALTREX) 500 MG tablet TAKE 1 TABLET BY MOUTH EVERY DAY    No facility-administered encounter medications on file as of 08/06/2017.    (doxy rx'd today, not taking prior to visit. Completed keflex prior to visit).  Allergies  Allergen Reactions  . Contrast Media [Iodinated Diagnostic Agents] Anaphylaxis, Shortness Of Breath and Other (See Comments)    Could not breath  . Iohexol Anaphylaxis, Shortness Of Breath and Other (See  Comments)    Immediately could not breathe  . Lisinopril Anaphylaxis, Shortness Of Breath and Rash  . Sulfa Antibiotics Anaphylaxis and Other (See Comments)    Historical from mother, pt states that mother says she almost died from this drug  . Latex Other (See Comments)    Unless against on skin for a long time, blisters. Short term is okay.  . Codeine Nausea Only, Anxiety and Other (See Comments)    insomnia  . Levofloxacin Other (See Comments)    insomnia  . Lipitor [Atorvastatin Calcium] Rash  . Vicodin [Hydrocodone-Acetaminophen] Itching   ROS:  Low grade fever, mild nausea. No vomiting, diarrhea, chest pain, palpitations, shortness of breath (breathing stable).  No bleeding, bruising.  +swelling improved with diuretics.  No other skin concerns.  See HPI  PHYSICAL EXAM:  BP 136/80   Pulse 80   Temp (!) 100.4 F (38 C) (Tympanic)   Ht 5' 4.5" (1.638 m)   Wt 244 lb 9.6 oz (110.9 kg)   LMP  (LMP Unknown)   BMI 41.34 kg/m   Well appearing, pleasant female, in no distress Skin of abdomen: area at right lower abdomen that is hyperpigmented, slightly flaky. No warmth, redness or swelling.    Right lower leg:  There is dark, red discoloration with some soft tissue swelling, peeling of skin, and some weeping centrally near shin. Warm to the touch. The redness extends more than halfway up the shin. Edges of erythema marked with pen. Inferior skin is thickened and hyperpigmented Left lower leg--only affected medially, some hyperpigmentation, slight flaking. No weeping or warmth Normal pulses  ASSESSMENT/PLAN:  Cellulitis of right lower extremity - Plan: doxycycline (VIBRA-TABS) 100 MG tablet  Venous stasis dermatitis of both lower extremities    Continue to take lasix 20mg  for the next day or two to help with swelling. Try and keep the legs elevated above the heart. Consider compression socks/stocking as the pain improves, when you are able to put them on. Take the  doxycycline twice daily with food. Return in 7-10 days day for recheck, sooner if not tolerating the medication, spreading of the redness beyond the inked area, any vomiting, or other new symptoms.

## 2017-08-07 ENCOUNTER — Encounter: Payer: Self-pay | Admitting: Family Medicine

## 2017-08-11 NOTE — Progress Notes (Signed)
Chief Complaint  Patient presents with  . Follow-up    on cellulilts, feels like it's not getting much better at all. Patient tearful because her shoulder is hurting and she really needs to have her surgery next week.     Seen 2/7 with RLE cellulitis. She has been taking doxycycline and presents for recheck. She has not noticed significant improvement, and after some swelling yesterday, seems worse.  Took Lasix daily for 6 days.  Only some slight improvement in the swelling. She has been trying to keep her leg elevated.  She has not been wearing a compression stocking.  She didn't take lasix yesterday; she was up and active, and at the end of the day the right leg was much more swollen than usual.  Then noticed increased weeping from the upper portion of the leg, and was bright red.  Not having fevers (99, last Thursday only, none this week). Tolerating doxycycline without side effects  No chest pain.  Breathing at baseline, no increased SOB.  PMH, [place] SH reviewed Meds reviewed, taking doxycycline, and no lasix yesterday, as stated above (doesn't take daily, just prn)  Allergies  Allergen Reactions  . Contrast Media [Iodinated Diagnostic Agents] Anaphylaxis, Shortness Of Breath and Other (See Comments)    Could not breath  . Iohexol Anaphylaxis, Shortness Of Breath and Other (See Comments)    Immediately could not breathe  . Lisinopril Anaphylaxis, Shortness Of Breath and Rash  . Sulfa Antibiotics Anaphylaxis and Other (See Comments)    Historical from mother, pt states that mother says she almost died from this drug  . Latex Other (See Comments)    Unless against on skin for a long time, blisters. Short term is okay.  . Codeine Nausea Only, Anxiety and Other (See Comments)    insomnia  . Levofloxacin Other (See Comments)    insomnia  . Lipitor [Atorvastatin Calcium] Rash  . Vicodin [Hydrocodone-Acetaminophen] Itching   ROS: fevers resolved, no chills, no chest pain, palpitations,  shortness of breath. No URI symptoms, COPD stable, breathing is at baseline.  No rashes other than legs. No bleeding, bruising.  She has persistent right shoulder pain, scheduled for shoulder surgery.  Anxious/nervous that it may be canceled due to leg issue.     PHYSICAL EXAM:  BP 138/80   Pulse 84   Temp 98.6 F (37 C) (Tympanic)   Ht 5' 4.5" (1.638 m)   Wt 244 lb 12.8 oz (111 kg)   LMP  (LMP Unknown)   BMI 41.37 kg/m   Well appearing female, who appears comfortable.  She became tearful at one point, fearing her shoulder surgery may be canceled, and she is in a lot of pain, not sleeping well.  Otherwise full range of affect, normal eye contact, speech, hygiene.  Right lower leg asymmetrically swollen compared to left There is diffuse erythema (slightly violaceous anteriorly), red and denuded skin superiorly that is new from last week. No purulence or crusting. No active drainage.  Peeling skin at edges. No palpable cord. Negative homan  Left leg--the hyperpigmentation is spreading more anteriorly compared to last week (was only medially).  Minimally pink.  +peeling skin.  No weeping  Discoloration (dark) noted at both ankles, unchanged/chronic.  Right lower abdomen:  Has an area of mild erythema with some flaking. (6x4cm)  This is about the same as last week, but now also has an area that is more lateral/inferior that is also red (margins indistinct, about 8x4). There is some induration/firmness  under the original spot, which extends across the abdomen to the left side. Nontender, ? If related to the mesh.  Chart reviewed, including last CXR, and last echo (04/2017, stable AS)  ASSESSMENT/PLAN:  Cellulitis of right lower extremity - appears worse, perhaps related to recent increase in swelling. Unclear if infectious. Unna boot, r/o DVT, change ABX to keflex - Plan: CBC with Differential/Platelet, Sedimentation Rate, D-dimer, quantitative (not at [facility]), cephALEXin (KEFLEX) 500 MG  capsule  Right leg swelling - r/o DVT. Unna boot. leg elevation/compression.  - Plan: D-dimer, quantitative (not at [facility])  Rash - on abdomen--?inflammatory reaction to mesh?   CBC, d-dimer, ESR Unna boot Change ABX to Keflex  ?inflammatory reaction to mesh  Surgery supposed to be 1 week from today

## 2017-08-13 ENCOUNTER — Ambulatory Visit (INDEPENDENT_AMBULATORY_CARE_PROVIDER_SITE_OTHER): Payer: Medicare HMO | Admitting: Family Medicine

## 2017-08-13 ENCOUNTER — Encounter: Payer: Self-pay | Admitting: Family Medicine

## 2017-08-13 VITALS — BP 138/80 | HR 84 | Temp 98.6°F | Ht 64.5 in | Wt 244.8 lb

## 2017-08-13 DIAGNOSIS — R21 Rash and other nonspecific skin eruption: Secondary | ICD-10-CM

## 2017-08-13 DIAGNOSIS — M7989 Other specified soft tissue disorders: Secondary | ICD-10-CM

## 2017-08-13 DIAGNOSIS — L03115 Cellulitis of right lower limb: Secondary | ICD-10-CM

## 2017-08-13 LAB — CBC WITH DIFFERENTIAL/PLATELET
Basophils Absolute: 0 10*3/uL (ref 0.0–0.2)
Basos: 1 %
EOS (ABSOLUTE): 0.2 10*3/uL (ref 0.0–0.4)
Eos: 3 %
Hematocrit: 37.5 % (ref 34.0–46.6)
Hemoglobin: 12.5 g/dL (ref 11.1–15.9)
Immature Grans (Abs): 0 10*3/uL (ref 0.0–0.1)
Immature Granulocytes: 0 %
Lymphocytes Absolute: 1.1 10*3/uL (ref 0.7–3.1)
Lymphs: 19 %
MCH: 28.3 pg (ref 26.6–33.0)
MCHC: 33.3 g/dL (ref 31.5–35.7)
MCV: 85 fL (ref 79–97)
Monocytes Absolute: 0.2 10*3/uL (ref 0.1–0.9)
Monocytes: 4 %
Neutrophils Absolute: 4.2 10*3/uL (ref 1.4–7.0)
Neutrophils: 73 %
Platelets: 171 10*3/uL (ref 150–379)
RBC: 4.42 x10E6/uL (ref 3.77–5.28)
RDW: 15.7 % — ABNORMAL HIGH (ref 12.3–15.4)
WBC: 5.7 10*3/uL (ref 3.4–10.8)

## 2017-08-13 LAB — SEDIMENTATION RATE: Sed Rate: 10 mm/hr (ref 0–40)

## 2017-08-13 LAB — D-DIMER, QUANTITATIVE: D-DIMER: 0.96 mg/L FEU — ABNORMAL HIGH (ref 0.00–0.49)

## 2017-08-13 MED ORDER — CEPHALEXIN 500 MG PO CAPS: 500.0000 mg | ORAL_CAPSULE | Freq: Four times a day (QID) | ORAL | 0 refills | Status: DC

## 2017-08-13 NOTE — Patient Instructions (Signed)
We are changing the antibiotic back to keflex. Stop the doxycycline. We are going to try and arrange for a compression dressing/Unna boot. Keep the leg elevated and/or compressed as much as possible.  We will be in contact with your test results soon.

## 2017-08-14 ENCOUNTER — Telehealth: Payer: Self-pay

## 2017-08-14 ENCOUNTER — Other Ambulatory Visit: Payer: Self-pay | Admitting: Family Medicine

## 2017-08-14 ENCOUNTER — Ambulatory Visit (HOSPITAL_COMMUNITY)
Admission: RE | Admit: 2017-08-14 | Discharge: 2017-08-14 | Disposition: A | Discharge status: Home / Self Care | Payer: Medicare HMO | Source: Ambulatory Visit | Attending: Family Medicine | Admitting: Family Medicine

## 2017-08-14 DIAGNOSIS — R7989 Other specified abnormal findings of blood chemistry: Secondary | ICD-10-CM | POA: Diagnosis not present

## 2017-08-14 DIAGNOSIS — R609 Edema, unspecified: Secondary | ICD-10-CM

## 2017-08-14 DIAGNOSIS — M79604 Pain in right leg: Secondary | ICD-10-CM | POA: Insufficient documentation

## 2017-08-14 NOTE — Progress Notes (Signed)
Preliminary results by tech - Venous Duplex Right Lower Ext. Completed. Negative for deep and superficial vein thrombosis. Oda Cogan, BS, RDMS, RVT

## 2017-08-14 NOTE — Telephone Encounter (Signed)
Pt gave Janett Billow phone number and Janett Billow was called to ask if the boot could still be put on. No answer from the nurse and a message was left for her that it was ok to apply the boot. Thanks [employer]

## 2017-08-14 NOTE — Telephone Encounter (Signed)
Pt had doppler today to R/O blood clot. Pt wanted Janett Billow to be called so that her boot could be applied before the weekend. Pt was advised that after dr Tomi Bamberger saw the results that we could make the call if Dr. Tomi Bamberger says its ok. Please advise upon review. Thanks [employer]

## 2017-08-14 NOTE — Telephone Encounter (Signed)
Doppler was negative. Please see if they can do her boot.  Thanks

## 2017-08-16 ENCOUNTER — Other Ambulatory Visit: Payer: Self-pay | Admitting: Family Medicine

## 2017-08-17 ENCOUNTER — Telehealth: Payer: Self-pay | Admitting: Family Medicine

## 2017-08-17 ENCOUNTER — Other Ambulatory Visit: Payer: Self-pay | Admitting: Family Medicine

## 2017-08-17 DIAGNOSIS — Z6841 Body Mass Index (BMI) 40.0 and over, adult: Secondary | ICD-10-CM | POA: Diagnosis not present

## 2017-08-17 DIAGNOSIS — Z7984 Long term (current) use of oral hypoglycemic drugs: Secondary | ICD-10-CM | POA: Diagnosis not present

## 2017-08-17 DIAGNOSIS — E119 Type 2 diabetes mellitus without complications: Secondary | ICD-10-CM | POA: Diagnosis not present

## 2017-08-17 DIAGNOSIS — I2510 Atherosclerotic heart disease of native coronary artery without angina pectoris: Secondary | ICD-10-CM | POA: Diagnosis not present

## 2017-08-17 DIAGNOSIS — Z87891 Personal history of nicotine dependence: Secondary | ICD-10-CM | POA: Diagnosis not present

## 2017-08-17 DIAGNOSIS — I10 Essential (primary) hypertension: Secondary | ICD-10-CM | POA: Diagnosis not present

## 2017-08-17 DIAGNOSIS — L03116 Cellulitis of left lower limb: Secondary | ICD-10-CM | POA: Diagnosis not present

## 2017-08-17 NOTE — Telephone Encounter (Signed)
Is this okay to refill? 

## 2017-08-17 NOTE — Telephone Encounter (Signed)
She reports that legs look much better since on the Keflex. Surgery was postponed to 3/7. Nurses just came today for [employer].  They will be back on Thursday.  We will cancel her Wednesday follow-up  Forwarding to front to please CANCEL HER WED F/U APPT

## 2017-08-17 NOTE — Telephone Encounter (Signed)
Pt called and stated that her legs got wrapped in unna boots today. She wants to know if you still need to see her Wednesday. Please advise pt at [phone].

## 2017-08-19 ENCOUNTER — Ambulatory Visit: Payer: Medicare HMO | Admitting: Family Medicine

## 2017-08-20 DIAGNOSIS — Z6841 Body Mass Index (BMI) 40.0 and over, adult: Secondary | ICD-10-CM | POA: Diagnosis not present

## 2017-08-20 DIAGNOSIS — I2510 Atherosclerotic heart disease of native coronary artery without angina pectoris: Secondary | ICD-10-CM | POA: Diagnosis not present

## 2017-08-20 DIAGNOSIS — L03116 Cellulitis of left lower limb: Secondary | ICD-10-CM | POA: Diagnosis not present

## 2017-08-20 DIAGNOSIS — Z7984 Long term (current) use of oral hypoglycemic drugs: Secondary | ICD-10-CM | POA: Diagnosis not present

## 2017-08-20 DIAGNOSIS — Z87891 Personal history of nicotine dependence: Secondary | ICD-10-CM | POA: Diagnosis not present

## 2017-08-20 DIAGNOSIS — I10 Essential (primary) hypertension: Secondary | ICD-10-CM | POA: Diagnosis not present

## 2017-08-20 DIAGNOSIS — E119 Type 2 diabetes mellitus without complications: Secondary | ICD-10-CM | POA: Diagnosis not present

## 2017-08-24 DIAGNOSIS — E119 Type 2 diabetes mellitus without complications: Secondary | ICD-10-CM | POA: Diagnosis not present

## 2017-08-24 DIAGNOSIS — L03116 Cellulitis of left lower limb: Secondary | ICD-10-CM | POA: Diagnosis not present

## 2017-08-24 DIAGNOSIS — Z6841 Body Mass Index (BMI) 40.0 and over, adult: Secondary | ICD-10-CM | POA: Diagnosis not present

## 2017-08-24 DIAGNOSIS — I10 Essential (primary) hypertension: Secondary | ICD-10-CM | POA: Diagnosis not present

## 2017-08-24 DIAGNOSIS — Z87891 Personal history of nicotine dependence: Secondary | ICD-10-CM | POA: Diagnosis not present

## 2017-08-24 DIAGNOSIS — Z7984 Long term (current) use of oral hypoglycemic drugs: Secondary | ICD-10-CM | POA: Diagnosis not present

## 2017-08-24 DIAGNOSIS — I2510 Atherosclerotic heart disease of native coronary artery without angina pectoris: Secondary | ICD-10-CM | POA: Diagnosis not present

## 2017-08-27 ENCOUNTER — Other Ambulatory Visit (HOSPITAL_COMMUNITY): Payer: Self-pay | Admitting: General Surgery

## 2017-08-27 ENCOUNTER — Encounter: Payer: Self-pay | Admitting: Family Medicine

## 2017-08-27 ENCOUNTER — Ambulatory Visit (INDEPENDENT_AMBULATORY_CARE_PROVIDER_SITE_OTHER): Payer: Medicare HMO | Admitting: Family Medicine

## 2017-08-27 VITALS — BP 136/80 | HR 84 | Temp 99.4°F | Ht 64.5 in | Wt 246.2 lb

## 2017-08-27 DIAGNOSIS — R609 Edema, unspecified: Secondary | ICD-10-CM | POA: Diagnosis not present

## 2017-08-27 DIAGNOSIS — I872 Venous insufficiency (chronic) (peripheral): Secondary | ICD-10-CM

## 2017-08-27 DIAGNOSIS — K439 Ventral hernia without obstruction or gangrene: Secondary | ICD-10-CM | POA: Diagnosis not present

## 2017-08-27 MED ORDER — FUROSEMIDE 20 MG PO TABS: 20.0000 mg | ORAL_TABLET | Freq: Every day | ORAL | 1 refills | Status: DC | PRN

## 2017-08-27 NOTE — Progress Notes (Signed)
Chief Complaint  Patient presents with  . Advice Only    surgery is scheduled for 3/7-told by home health that she needs to keep having una boots put on until April-she wants to know if this is needed. She does not want to have them on when she has the surgery.    She presents today asking if Unna boot therapy could be discontinued.  They were planning to continue through April 15th.  Her swelling and redness has significantly improved s/p course of Keflex.  There are no longer any open areas of skin, weeping/drainage or pain.  She has been taking lasix once daily (not today) to help with the swelling.  She just got her compression socks this morning.  Shoulder surgery is next Thursday, and doesn't think she will be able to manage all of this.  It is messy, and she really doesn't have shoes that she can walk in when they are wrapped, can't really leave the house. Her husband will be able to help put on her compression socks.  She saw Dr. Marlou Starks this morning--due to continuing to run some lowgrade fevers and ongoing redness, swelling in the right lower abdomen, plan is for ultrasound and drainage of more fluid to r/o infection.  PMH, PSH, SH reviewed  Outpatient Encounter Medications as of 08/27/2017  Medication Sig Note  . bisoprolol-hydrochlorothiazide (ZIAC) 10-6.25 MG tablet TAKE 1 TABLET EVERY DAY   . Calcium Carbonate-Vitamin D (CALCIUM 600+D) 600-400 MG-UNIT per tablet Take 1 tablet by mouth daily.  05/06/2017: Stopped for procedure  . cetirizine (KLS ALLER-TEC) 10 MG tablet Take 10 mg by mouth daily.   . Cholecalciferol (EQL VITAMIN D3) 2000 units CAPS Take 4,000 Units daily by mouth. 05/06/2017: Stopped for procedure  . fluticasone (FLONASE) 50 MCG/ACT nasal spray Place 2 sprays into both nostrils daily. (Patient taking differently: Place 2 sprays at bedtime into both nostrils. )   . Fluticasone-Umeclidin-Vilant (TRELEGY ELLIPTA) 100-62.5-25 MCG/INH AEPB Inhale 1 puff into the lungs daily.  (Patient taking differently: Inhale 2 puffs daily into the lungs. )   . furosemide (LASIX) 20 MG tablet Take 1 tablet (20 mg total) by mouth daily as needed for fluid or edema.   . metFORMIN (GLUCOPHAGE-XR) 500 MG 24 hr tablet TAKE 1 TABLET THREE TIMES DAILY (Patient taking differently: Take 500 mg by mouth 3 times daily)   . simvastatin (ZOCOR) 20 MG tablet TAKE 1 TABLET AT BEDTIME   . TRUEPLUS LANCETS 33G MISC TEST ONE TIME DAILY   . valACYclovir (VALTREX) 500 MG tablet TAKE 1 TABLET BY MOUTH EVERY DAY   . [DISCONTINUED] furosemide (LASIX) 20 MG tablet Take 20 mg daily as needed by mouth for fluid or edema.    Marland Kitchen acetaminophen (TYLENOL) 500 MG tablet Take 1,000 mg every 6 (six) hours as needed by mouth for moderate pain or headache.    . albuterol (PROVENTIL HFA;VENTOLIN HFA) 108 (90 Base) MCG/ACT inhaler Inhale 2 puffs every 6 (six) hours as needed into the lungs for wheezing or shortness of breath.   Marland Kitchen albuterol (PROVENTIL) (2.5 MG/3ML) 0.083% nebulizer solution USE 1 VIAL BY NEBULIZATION EVERY FOUR HOURS AS NEEDED FOR WHEEZING OR SHORTNESS OF BREATH. (Patient not taking: Reported on 08/06/2017)   . aspirin EC 81 MG tablet Take 81 mg daily by mouth. 05/06/2017: Stopped for procedure  . Multiple Vitamins-Minerals (CENTRUM SILVER PO) Take 1 tablet by mouth daily.  05/06/2017: Stopped for procedure  . triamcinolone cream (KENALOG) 0.1 % Apply 1 application 2 (two)  times daily as needed topically (for dark red areas on leg).   . [DISCONTINUED] cephALEXin (KEFLEX) 500 MG capsule Take 1 capsule (500 mg total) by mouth 4 (four) times daily.    No facility-administered encounter medications on file as of 08/27/2017.    Allergies  Allergen Reactions  . Contrast Media [Iodinated Diagnostic Agents] Anaphylaxis, Shortness Of Breath and Other (See Comments)    Could not breath  . Iohexol Anaphylaxis, Shortness Of Breath and Other (See Comments)    Immediately could not breathe  . Lisinopril Anaphylaxis,  Shortness Of Breath and Rash  . Sulfa Antibiotics Anaphylaxis and Other (See Comments)    Historical from mother, pt states that mother says she almost died from this drug  . Latex Other (See Comments)    Unless against on skin for a long time, blisters. Short term is okay.  . Codeine Nausea Only, Anxiety and Other (See Comments)    insomnia  . Levofloxacin Other (See Comments)    insomnia  . Lipitor [Atorvastatin Calcium] Rash  . Vicodin [Hydrocodone-Acetaminophen] Itching   ROS:  Low grade fevers.  No headache, chest pain.  Swelling is better, taking lasix daily.  No rashes except as previously mentioned.  No vomiting, or other concerns.  PHYSICAL EXAM:  BP 136/80   Pulse 84   Temp 99.4 F (37.4 C)   Ht 5' 4.5" (1.638 m)   Wt 246 lb 3.2 oz (111.7 kg)   LMP  (LMP Unknown)   BMI 41.61 kg/m   Well appearing, pleasant female in good spirits. Legs--only trace pretibial edema, R>L All open areas have healed There is chronic hyperpigmentation, but no longer erythema/warmth. Nontender Has compression socks--(got this morning)--comfortable.  Some discomfort with elevation of the right arm due to shoulder pain (not evaluated today).   Abdomen--much fainter erythema than at last visit (and not well demarcated, in the area lateral to the scar).  The portion over the scar is more hyperpigmented, less red, no longer flaking.  Tender in this area (and firm). Psych: normal mood, affect, hygiene and grooming  ASSESSMENT/PLAN:  Venous stasis dermatitis of both lower extremities - cellulitis resolved. swelling improved.  continue compression stockings, lasix prn. May D/C Unna boot  Peripheral edema - Plan: furosemide (LASIX) 20 MG tablet   I called nurse to cancel Unna boot placement that was due this afternoon. Will fax order to d/c to [facility]   I agree that further Unna boot therapy isn't needed at this time, as long as you can keep the swelling under control with the compression  socks and lasix. If you develop any recurrent increase in redness, swelling, drainage, let us know.

## 2017-08-27 NOTE — Patient Instructions (Signed)
  I agree that further Unna boot therapy isn't needed at this time, as long as you can keep the swelling under control with the compression socks and lasix. If you develop any recurrent increase in redness, swelling, drainage, let us know.

## 2017-08-28 ENCOUNTER — Other Ambulatory Visit: Payer: Self-pay | Admitting: Radiology

## 2017-08-31 ENCOUNTER — Other Ambulatory Visit: Payer: Self-pay | Admitting: Radiology

## 2017-08-31 ENCOUNTER — Other Ambulatory Visit: Payer: Self-pay | Admitting: Student

## 2017-09-01 ENCOUNTER — Ambulatory Visit (HOSPITAL_COMMUNITY)
Admission: RE | Admit: 2017-09-01 | Discharge: 2017-09-01 | Disposition: A | Discharge status: Home / Self Care | Payer: Medicare HMO | Source: Ambulatory Visit | Attending: General Surgery | Admitting: General Surgery

## 2017-09-01 ENCOUNTER — Encounter (HOSPITAL_COMMUNITY): Payer: Self-pay

## 2017-09-01 DIAGNOSIS — L02211 Cutaneous abscess of abdominal wall: Secondary | ICD-10-CM | POA: Diagnosis not present

## 2017-09-01 DIAGNOSIS — K439 Ventral hernia without obstruction or gangrene: Secondary | ICD-10-CM | POA: Diagnosis not present

## 2017-09-01 LAB — PROTIME-INR
INR: 1.11
Prothrombin Time: 14.2 seconds (ref 11.4–15.2)

## 2017-09-01 LAB — CBC
HCT: 37.9 % (ref 36.0–46.0)
Hemoglobin: 12.1 g/dL (ref 12.0–15.0)
MCH: 28 pg (ref 26.0–34.0)
MCHC: 31.9 g/dL (ref 30.0–36.0)
MCV: 87.7 fL (ref 78.0–100.0)
Platelets: 142 10*3/uL — ABNORMAL LOW (ref 150–400)
RBC: 4.32 MIL/uL (ref 3.87–5.11)
RDW: 15.9 % — ABNORMAL HIGH (ref 11.5–15.5)
WBC: 4.8 10*3/uL (ref 4.0–10.5)

## 2017-09-01 LAB — APTT: aPTT: 33 seconds (ref 24–36)

## 2017-09-01 LAB — GLUCOSE, CAPILLARY: Glucose-Capillary: 157 mg/dL — ABNORMAL HIGH (ref 65–99)

## 2017-09-01 MED ORDER — FENTANYL CITRATE (PF) 100 MCG/2ML IJ SOLN
INTRAMUSCULAR | Status: AC
  Filled 2017-09-01: qty 2

## 2017-09-01 MED ORDER — LIDOCAINE HCL (PF) 1 % IJ SOLN
INTRAMUSCULAR | Status: AC
  Filled 2017-09-01: qty 30

## 2017-09-01 MED ORDER — SODIUM CHLORIDE 0.9 % IV SOLN: INTRAVENOUS | Status: DC

## 2017-09-01 MED ORDER — MIDAZOLAM HCL 2 MG/2ML IJ SOLN
INTRAMUSCULAR | Status: AC
  Filled 2017-09-01: qty 2

## 2017-09-01 NOTE — Procedures (Signed)
  Procedure: US aspiration RLQ seroma 57ml thin serosanguinous sent for gs&cx EBL:   minimal Complications:  none immediate  See full dictation in [facility].  Dillard Cannon MD Main # [phone] Pager  [phone]

## 2017-09-01 NOTE — Progress Notes (Signed)
Aspiration done without sedation. Pt tolerated well. Bandaid applied to puncture site. D/C home in wheelchair with husband. Awake and alert. In no distress.

## 2017-09-02 DIAGNOSIS — J449 Chronic obstructive pulmonary disease, unspecified: Secondary | ICD-10-CM | POA: Diagnosis not present

## 2017-09-02 DIAGNOSIS — J441 Chronic obstructive pulmonary disease with (acute) exacerbation: Secondary | ICD-10-CM | POA: Diagnosis not present

## 2017-09-03 ENCOUNTER — Telehealth: Payer: Self-pay | Admitting: Acute Care

## 2017-09-03 ENCOUNTER — Telehealth: Payer: Self-pay | Admitting: Family Medicine

## 2017-09-03 NOTE — Telephone Encounter (Signed)
Left message on Shannon Schultz's phone about ok discharge order

## 2017-09-03 NOTE — Telephone Encounter (Signed)
She was getting services for [employer].  We called (and signed order) to cancel this, as Unna boot was no longer necessary.  Their services are no longer needed (which is why she is refusing).  Okay to discharge, services are no longer needed.

## 2017-09-03 NOTE — Telephone Encounter (Signed)
Anderson Malta with [facility] called and stated that pt is refusing services and they will need to discharge her. Anderson Malta can be reached at [phone].

## 2017-09-04 NOTE — Telephone Encounter (Signed)
Will route Denise.

## 2017-09-06 LAB — AEROBIC/ANAEROBIC CULTURE W GRAM STAIN (SURGICAL/DEEP WOUND)
Culture: NO GROWTH
Gram Stain: NONE SEEN

## 2017-09-06 LAB — AEROBIC/ANAEROBIC CULTURE (SURGICAL/DEEP WOUND)

## 2017-09-07 ENCOUNTER — Other Ambulatory Visit: Payer: Self-pay | Admitting: Family Medicine

## 2017-09-07 DIAGNOSIS — I10 Essential (primary) hypertension: Secondary | ICD-10-CM | POA: Diagnosis not present

## 2017-09-07 DIAGNOSIS — J3089 Other allergic rhinitis: Secondary | ICD-10-CM

## 2017-09-07 DIAGNOSIS — Z6841 Body Mass Index (BMI) 40.0 and over, adult: Secondary | ICD-10-CM | POA: Diagnosis not present

## 2017-09-07 DIAGNOSIS — Z87891 Personal history of nicotine dependence: Secondary | ICD-10-CM | POA: Diagnosis not present

## 2017-09-07 DIAGNOSIS — I2510 Atherosclerotic heart disease of native coronary artery without angina pectoris: Secondary | ICD-10-CM | POA: Diagnosis not present

## 2017-09-07 DIAGNOSIS — L03116 Cellulitis of left lower limb: Secondary | ICD-10-CM | POA: Diagnosis not present

## 2017-09-07 DIAGNOSIS — E119 Type 2 diabetes mellitus without complications: Secondary | ICD-10-CM | POA: Diagnosis not present

## 2017-09-07 DIAGNOSIS — Z7984 Long term (current) use of oral hypoglycemic drugs: Secondary | ICD-10-CM | POA: Diagnosis not present

## 2017-09-07 NOTE — Telephone Encounter (Signed)
Will forward to Rodena Piety to get CT scheduled.

## 2017-09-07 NOTE — Telephone Encounter (Signed)
Shannon Schultz has her LCS CT scheduled on 09/16/2017 @ 9:30am and she is aware of this appt and location

## 2017-09-11 ENCOUNTER — Other Ambulatory Visit: Payer: Self-pay | Admitting: Pulmonary Disease

## 2017-09-14 ENCOUNTER — Other Ambulatory Visit: Payer: Medicare HMO

## 2017-09-14 DIAGNOSIS — I10 Essential (primary) hypertension: Secondary | ICD-10-CM | POA: Diagnosis not present

## 2017-09-14 DIAGNOSIS — E119 Type 2 diabetes mellitus without complications: Secondary | ICD-10-CM | POA: Diagnosis not present

## 2017-09-14 DIAGNOSIS — Z6841 Body Mass Index (BMI) 40.0 and over, adult: Secondary | ICD-10-CM | POA: Diagnosis not present

## 2017-09-14 DIAGNOSIS — E7800 Pure hypercholesterolemia, unspecified: Secondary | ICD-10-CM

## 2017-09-14 DIAGNOSIS — I700 Atherosclerosis of aorta: Secondary | ICD-10-CM

## 2017-09-14 DIAGNOSIS — I2510 Atherosclerotic heart disease of native coronary artery without angina pectoris: Secondary | ICD-10-CM | POA: Diagnosis not present

## 2017-09-14 DIAGNOSIS — L03116 Cellulitis of left lower limb: Secondary | ICD-10-CM | POA: Diagnosis not present

## 2017-09-14 DIAGNOSIS — Z87891 Personal history of nicotine dependence: Secondary | ICD-10-CM | POA: Diagnosis not present

## 2017-09-14 DIAGNOSIS — K7460 Unspecified cirrhosis of liver: Secondary | ICD-10-CM | POA: Diagnosis not present

## 2017-09-14 DIAGNOSIS — Z7984 Long term (current) use of oral hypoglycemic drugs: Secondary | ICD-10-CM | POA: Diagnosis not present

## 2017-09-14 NOTE — Addendum Note (Signed)
Addended by: Minette Headland A on: 09/14/2017 08:39 AM   Modules accepted: Orders

## 2017-09-15 LAB — COMPREHENSIVE METABOLIC PANEL
ALT: 24 IU/L (ref 0–32)
AST: 35 IU/L (ref 0–40)
Albumin/Globulin Ratio: 1.5 (ref 1.2–2.2)
Albumin: 4 g/dL (ref 3.5–4.8)
Alkaline Phosphatase: 71 IU/L (ref 39–117)
BUN/Creatinine Ratio: 22 (ref 12–28)
BUN: 17 mg/dL (ref 8–27)
Bilirubin Total: 0.6 mg/dL (ref 0.0–1.2)
CO2: 22 mmol/L (ref 20–29)
Calcium: 9.3 mg/dL (ref 8.7–10.3)
Chloride: 100 mmol/L (ref 96–106)
Creatinine, Ser: 0.79 mg/dL (ref 0.57–1.00)
GFR calc Af Amer: 87 mL/min/{1.73_m2} (ref >59)
GFR calc non Af Amer: 76 mL/min/{1.73_m2} (ref >59)
Globulin, Total: 2.7 g/dL (ref 1.5–4.5)
Glucose: 139 mg/dL — ABNORMAL HIGH (ref 65–99)
Potassium: 4.3 mmol/L (ref 3.5–5.2)
Sodium: 141 mmol/L (ref 134–144)
Total Protein: 6.7 g/dL (ref 6.0–8.5)

## 2017-09-15 LAB — LIPID PANEL
Chol/HDL Ratio: 2.1 ratio (ref 0.0–4.4)
Cholesterol, Total: 108 mg/dL (ref 100–199)
HDL: 51 mg/dL (ref >39)
LDL Calculated: 38 mg/dL (ref 0–99)
Triglycerides: 96 mg/dL (ref 0–149)
VLDL Cholesterol Cal: 19 mg/dL (ref 5–40)

## 2017-09-15 LAB — HEMOGLOBIN A1C
Est. average glucose Bld gHb Est-mCnc: 148 mg/dL
Hgb A1c MFr Bld: 6.8 % — ABNORMAL HIGH (ref 4.8–5.6)

## 2017-09-15 LAB — MICROALBUMIN / CREATININE URINE RATIO
Creatinine, Urine: 82.4 mg/dL
Microalb/Creat Ratio: 15.8 mg/g creat (ref 0.0–30.0)
Microalbumin, Urine: 13 ug/mL

## 2017-09-15 NOTE — Progress Notes (Signed)
Chief Complaint  Patient presents with  . Diabetes    nonfasting med check.     She had 1 year f/u CT for lung cancer screening earlier today.(previously noted to have cirrhosis, atherosclerosis of coronary arteries and aorta.)  Right rotator cuff tear noted on MRI 06/2017. She canceled her surgery. Needed dental work, needed extraction.  She is still sleeping on her couch (softer for her shoulder, and her hip bursitis), still has pain with certain movements. Surgery is on hold for now, waiting for her tooth to heal.  Diabetes: Blood sugars at home are running 104-142, usually in the 120's in the mornings; not checking other times of the day. Denies hypoglycemia. Denies polydipsia and polyuria. Last eye exam was in May 2018, plans to change to ophtho, given her cataracts (had been seeing optometrist). Denies new vision changes.  Patient follows a low sugar diet and checks feet regularly without concerns. Denies numbness, tingling. Some intermittent numbness in the 2nd and 3rd toes on the right foot (h/o fractures, didn't heal right, per pt). No skin lesions, shins are healed up, no drainage. Takes metformin TID--this seems to be the most tolerable way to take it.  Hyperlipidemia follow-up: Patient is reportedly following a low-fat, low cholesterol diet. Compliant with medications and denies medication side effects.   Hypertension: Blood pressure hasn't checked at home in a while.Denies headaches, dizziness, chest pain. Denies side effects of medications. Compliant with medication.   COPD: She is seeing pulmonary, doing well on Trelegy. She no longer uses oxygen (previously used it prn with exertion.) She just stops to rest if she needs to.  Prior to her wellness visit, she had signed back on Weight [employer], with a starting weight of 250# (emotional eating after her sister died). She has been noted to have fatty liver, with some cirrhosis noted on last CT scan last year. She quit this,  was too much trouble to get there with her leg issues. "I had a rough year"  Allergies:  Using flonase nightly, allergies are controlled. Has some nose bleeds.  She was diagnosed with atypical ductal hyperplasia after a mammogram revealed some abnormalities. She underwent left lumpectomy on 12/22/16 and final pathology revealed atypical ductal hyperplasia and atypical lobular hyperplasia. She last saw Dr. Lindi Adie (onc) 02/16/17.  No additional treatments were recommended, just 30" exercise daily, weight loss, more fruits/vegetables and less red meat.  H/o bladder cancer--lesion in bladder was noted incidentally on CT 11/2016 prior to hernia surgery. She had bladder mass removed by urology in 12/2016, and pathology showed LOW GRADE PAPILLARY UROTHELIAL CARCINOMA She was treated with intravesicular post-op chemo. No further problems.  She had the ventral hernia repaired in 04/2017. She has had some ongoing issues with the mesh and cystic fluid collections, for which she has been following with surgeon. They removed more fluid at last visit. It is less red now.  On valtrex preventatively for HSV flare on hip. Last flare was the end of last year (November), when she cut the dose in half, to make it last longer.  H/o trochanteric bursitis--treated with cortisone injection into the right trochanteric bursa on 02/26/17. benefit was short-lived, still has discomfort, but less since sleeping on the couch.  PMH, PSH, SH reviewed  Outpatient Encounter Medications as of 09/16/2017  Medication Sig Note  . acetaminophen (TYLENOL) 500 MG tablet Take 1,000 mg every 6 (six) hours as needed by mouth for moderate pain or headache.    Marland Kitchen aspirin EC 81 MG tablet  Take 81 mg daily by mouth. 05/06/2017: Stopped for procedure  . bisoprolol-hydrochlorothiazide (ZIAC) 10-6.25 MG tablet TAKE 1 TABLET EVERY DAY   . Calcium Carbonate-Vitamin D (CALCIUM 600+D) 600-400 MG-UNIT per tablet Take 1 tablet by mouth daily.    .  cetirizine (KLS ALLER-TEC) 10 MG tablet Take 10 mg by mouth daily.   . Cholecalciferol (EQL VITAMIN D3) 2000 units CAPS Take 4,000 Units daily by mouth.   . fluticasone (FLONASE) 50 MCG/ACT nasal spray USE 2 SPRAYS IN EACH NOSTRIL EVERY DAY   . metFORMIN (GLUCOPHAGE-XR) 500 MG 24 hr tablet TAKE 1 TABLET THREE TIMES DAILY   . Multiple Vitamins-Minerals (CENTRUM SILVER PO) Take 1 tablet by mouth daily.    . simvastatin (ZOCOR) 20 MG tablet TAKE 1 TABLET AT BEDTIME   . TRELEGY ELLIPTA 100-62.5-25 MCG/INH AEPB INHALE 1 PUFF INTO THE LUNGS EVERY DAY   . TRUEPLUS LANCETS 33G MISC TEST ONE TIME DAILY   . valACYclovir (VALTREX) 500 MG tablet TAKE 1 TABLET BY MOUTH EVERY DAY   . albuterol (PROVENTIL HFA;VENTOLIN HFA) 108 (90 Base) MCG/ACT inhaler Inhale 2 puffs every 6 (six) hours as needed into the lungs for wheezing or shortness of breath.   Marland Kitchen albuterol (PROVENTIL) (2.5 MG/3ML) 0.083% nebulizer solution USE 1 VIAL BY NEBULIZATION EVERY FOUR HOURS AS NEEDED FOR WHEEZING OR SHORTNESS OF BREATH. (Patient not taking: Reported on 08/06/2017)   . furosemide (LASIX) 20 MG tablet Take 1 tablet (20 mg total) by mouth daily as needed for fluid or edema. (Patient not taking: Reported on 09/16/2017) 09/16/2017: Uses prn edema, over a week ago was last dose  . triamcinolone cream (KENALOG) 0.1 % Apply 1 application 2 (two) times daily as needed topically (for dark red areas on leg).   . [DISCONTINUED] valACYclovir (VALTREX) 500 MG tablet TAKE 1 TABLET BY MOUTH EVERY DAY    No facility-administered encounter medications on file as of 09/16/2017.     ROS: no fever, chills, URI symptoms, allergies are controlled.  Some recent nosebleeds. No chest pain, palpitations, shortness of breath (some with walking, less if uses a cart when she walks).  Periodically has some cramping in hands/feet/legs, mild. She bought OTC K+, hasn't taken any yet.   PHYSICAL EXAM:  BP 120/74   Pulse 72   Ht 5' 4.5" (1.638 m)   Wt 243 lb 6.4 oz  (110.4 kg)   LMP  (LMP Unknown)   BMI 41.13 kg/m   Wt Readings from Last 3 Encounters:  09/16/17 243 lb 6.4 oz (110.4 kg)  09/01/17 244 lb (110.7 kg)  08/27/17 246 lb 3.2 oz (111.7 kg)   Well appearing, pleasant, obese female in good spirits.  HEENT: PERRL, EOMI, conjunctiva and sclera are clear Nasal mucosa is norma, thoughtnoted some scab on the septum on R. Sinuses nontender. Neck: no lymphadenopathy or mass Heart: regular rate and rhythm, Murmur unchanged, loudest at RUSB\ Lungs: clear bilaterally Abdomen: soft, nontender, no mass. Superiorly and to the right of her scar there remains hyperpigmentation, slightly dry, small area of firmness--much better, and no longer erythematous; the area more lateral (to the right) is significantly improved as well, now soft, minimal hyperpigmentation. Extremities: Trace to 1+ edema noted through her compression stockings.  Pulled them down to evaluate skin--Hyperpigmentation unchanged, skin is intact, no erythema, drainage. nontender Neuro: alert and oriented, cranial nerves intact, normal gait Psych:  Normal mood, affect, hygiene and grooming   Lab Results  Component Value Date   HGBA1C 6.8 (H) 09/14/2017  Lab Results  Component Value Date   WBC 4.8 09/01/2017   HGB 12.1 09/01/2017   HCT 37.9 09/01/2017   MCV 87.7 09/01/2017   PLT 142 (L) 09/01/2017     Chemistry      Component Value Date/Time   NA 141 09/14/2017 0846   K 4.3 09/14/2017 0846   CL 100 09/14/2017 0846   CO2 22 09/14/2017 0846   BUN 17 09/14/2017 0846   CREATININE 0.79 09/14/2017 0846   CREATININE 0.75 03/05/2017 0736      Component Value Date/Time   CALCIUM 9.3 09/14/2017 0846   ALKPHOS 71 09/14/2017 0846   AST 35 09/14/2017 0846   ALT 24 09/14/2017 0846   BILITOT 0.6 09/14/2017 0846     Fasting glucose 139  Lab Results  Component Value Date   CHOL 108 09/14/2017   HDL 51 09/14/2017   LDLCALC 38 09/14/2017   TRIG 96 09/14/2017   CHOLHDL 2.1  09/14/2017   Urine microalbumin/Cr normal  ASSESSMENT/PLAN:  Type 2 diabetes mellitus without complication, without long-term current use of insulin (HCC) - A1c higher; discussed, diet, exercise, weight loss; continue 1583m metformin. Will need add'l meds if not improving at f/u.   Pure hypercholesterolemia - at goal on current regimen  [place]ortic [place] - continue statin, aspirin  Aortic valve stenosis, etiology of cardiac valve disease unspecified  Cirrhosis of liver without ascites, unspecified hepatic cirrhosis type ([place] - encouraged weight loss.  LFT's normal.  had f/u lung CT earlier today, likely will see/note again  [place]D without exacerbation ([place]  Venous stasis dermatitis of both lower extremities - continue to wear compression stockings, regular exercise, low sodium diet. Lasix prn  Essential hypertension, benign - well controlled   F/u at [facility] 6 mos, labs prior A1c, c-met, TSH Return sooner if sugars are higher   DM not as well controlled as in past.  Diet/exercise no med changes.   Consider re-joining Weight Watchers, and if not, be sure to have healthy diet, small portions, regular exercise in order to lose weight and get the sugars down.  Be sure to aim your flonase away from the septum/center. Use saline spray frequently throughout the day. This should help with nosebleeds.

## 2017-09-16 ENCOUNTER — Encounter: Payer: Self-pay | Admitting: Family Medicine

## 2017-09-16 ENCOUNTER — Ambulatory Visit (INDEPENDENT_AMBULATORY_CARE_PROVIDER_SITE_OTHER)
Admission: RE | Admit: 2017-09-16 | Discharge: 2017-09-16 | Disposition: A | Discharge status: Home / Self Care | Payer: Medicare HMO | Source: Ambulatory Visit | Attending: Acute Care | Admitting: Acute Care

## 2017-09-16 ENCOUNTER — Ambulatory Visit (INDEPENDENT_AMBULATORY_CARE_PROVIDER_SITE_OTHER): Payer: Medicare HMO | Admitting: Family Medicine

## 2017-09-16 ENCOUNTER — Other Ambulatory Visit: Payer: Self-pay | Admitting: Family Medicine

## 2017-09-16 VITALS — BP 120/74 | HR 72 | Ht 64.5 in | Wt 243.4 lb

## 2017-09-16 DIAGNOSIS — K7460 Unspecified cirrhosis of liver: Secondary | ICD-10-CM

## 2017-09-16 DIAGNOSIS — I350 Nonrheumatic aortic (valve) stenosis: Secondary | ICD-10-CM

## 2017-09-16 DIAGNOSIS — I10 Essential (primary) hypertension: Secondary | ICD-10-CM

## 2017-09-16 DIAGNOSIS — I700 Atherosclerosis of aorta: Secondary | ICD-10-CM | POA: Diagnosis not present

## 2017-09-16 DIAGNOSIS — J449 Chronic obstructive pulmonary disease, unspecified: Secondary | ICD-10-CM | POA: Diagnosis not present

## 2017-09-16 DIAGNOSIS — E7800 Pure hypercholesterolemia, unspecified: Secondary | ICD-10-CM

## 2017-09-16 DIAGNOSIS — E119 Type 2 diabetes mellitus without complications: Secondary | ICD-10-CM | POA: Diagnosis not present

## 2017-09-16 DIAGNOSIS — I872 Venous insufficiency (chronic) (peripheral): Secondary | ICD-10-CM | POA: Diagnosis not present

## 2017-09-16 DIAGNOSIS — Z87891 Personal history of nicotine dependence: Secondary | ICD-10-CM

## 2017-09-16 NOTE — Patient Instructions (Signed)
  Consider re-joining Weight [employer], and if not, be sure to have healthy diet, small portions, regular exercise in order to lose weight and get the sugars down.  Be sure to aim your flonase away from the septum/center. Use saline spray frequently throughout the day. This should help with nosebleeds.  Sugars are higher than on last check, still okay. Work on proper diet, exercise and weight loss.  If sugars are higher at home (over 140), then please return sooner than your next visit in Sept.

## 2017-09-21 ENCOUNTER — Other Ambulatory Visit: Payer: Self-pay | Admitting: Family Medicine

## 2017-09-21 ENCOUNTER — Other Ambulatory Visit: Payer: Self-pay | Admitting: Acute Care

## 2017-09-21 DIAGNOSIS — Z122 Encounter for screening for malignant neoplasm of respiratory organs: Secondary | ICD-10-CM

## 2017-09-21 DIAGNOSIS — Z87891 Personal history of nicotine dependence: Secondary | ICD-10-CM

## 2017-09-29 ENCOUNTER — Encounter: Payer: Self-pay | Admitting: Family Medicine

## 2017-09-29 ENCOUNTER — Emergency Department (HOSPITAL_COMMUNITY): Payer: Medicare HMO

## 2017-09-29 ENCOUNTER — Other Ambulatory Visit: Payer: Self-pay

## 2017-09-29 ENCOUNTER — Inpatient Hospital Stay (HOSPITAL_COMMUNITY)
Admission: EM | Admit: 2017-09-29 | Discharge: 2017-10-01 | DRG: 190 | Disposition: A | Discharge status: Home / Self Care | Payer: Medicare HMO | Attending: Internal Medicine | Admitting: Internal Medicine

## 2017-09-29 ENCOUNTER — Ambulatory Visit (INDEPENDENT_AMBULATORY_CARE_PROVIDER_SITE_OTHER): Payer: Medicare HMO | Admitting: Family Medicine

## 2017-09-29 ENCOUNTER — Encounter (HOSPITAL_COMMUNITY): Payer: Self-pay

## 2017-09-29 VITALS — BP 120/70 | HR 105 | Temp 98.4°F | Resp 18 | Wt 240.2 lb

## 2017-09-29 DIAGNOSIS — M19041 Primary osteoarthritis, right hand: Secondary | ICD-10-CM | POA: Diagnosis present

## 2017-09-29 DIAGNOSIS — J441 Chronic obstructive pulmonary disease with (acute) exacerbation: Secondary | ICD-10-CM | POA: Diagnosis not present

## 2017-09-29 DIAGNOSIS — E1169 Type 2 diabetes mellitus with other specified complication: Secondary | ICD-10-CM

## 2017-09-29 DIAGNOSIS — Z7982 Long term (current) use of aspirin: Secondary | ICD-10-CM

## 2017-09-29 DIAGNOSIS — Z882 Allergy status to sulfonamides status: Secondary | ICD-10-CM

## 2017-09-29 DIAGNOSIS — I350 Nonrheumatic aortic (valve) stenosis: Secondary | ICD-10-CM | POA: Diagnosis present

## 2017-09-29 DIAGNOSIS — M19042 Primary osteoarthritis, left hand: Secondary | ICD-10-CM | POA: Diagnosis present

## 2017-09-29 DIAGNOSIS — R0789 Other chest pain: Secondary | ICD-10-CM

## 2017-09-29 DIAGNOSIS — E119 Type 2 diabetes mellitus without complications: Secondary | ICD-10-CM | POA: Diagnosis present

## 2017-09-29 DIAGNOSIS — Z885 Allergy status to narcotic agent status: Secondary | ICD-10-CM

## 2017-09-29 DIAGNOSIS — R0602 Shortness of breath: Secondary | ICD-10-CM | POA: Diagnosis not present

## 2017-09-29 DIAGNOSIS — R509 Fever, unspecified: Secondary | ICD-10-CM | POA: Diagnosis not present

## 2017-09-29 DIAGNOSIS — Z87891 Personal history of nicotine dependence: Secondary | ICD-10-CM | POA: Diagnosis not present

## 2017-09-29 DIAGNOSIS — Z7984 Long term (current) use of oral hypoglycemic drugs: Secondary | ICD-10-CM | POA: Diagnosis not present

## 2017-09-29 DIAGNOSIS — Z91040 Latex allergy status: Secondary | ICD-10-CM

## 2017-09-29 DIAGNOSIS — Z6839 Body mass index (BMI) 39.0-39.9, adult: Secondary | ICD-10-CM | POA: Diagnosis present

## 2017-09-29 DIAGNOSIS — Z888 Allergy status to other drugs, medicaments and biological substances status: Secondary | ICD-10-CM

## 2017-09-29 DIAGNOSIS — J449 Chronic obstructive pulmonary disease, unspecified: Secondary | ICD-10-CM | POA: Diagnosis present

## 2017-09-29 DIAGNOSIS — Z79899 Other long term (current) drug therapy: Secondary | ICD-10-CM | POA: Diagnosis not present

## 2017-09-29 DIAGNOSIS — I110 Hypertensive heart disease with heart failure: Secondary | ICD-10-CM | POA: Diagnosis not present

## 2017-09-29 DIAGNOSIS — J9611 Chronic respiratory failure with hypoxia: Secondary | ICD-10-CM | POA: Diagnosis present

## 2017-09-29 DIAGNOSIS — Z7951 Long term (current) use of inhaled steroids: Secondary | ICD-10-CM

## 2017-09-29 DIAGNOSIS — Z881 Allergy status to other antibiotic agents status: Secondary | ICD-10-CM

## 2017-09-29 DIAGNOSIS — R0902 Hypoxemia: Secondary | ICD-10-CM | POA: Diagnosis not present

## 2017-09-29 DIAGNOSIS — R05 Cough: Secondary | ICD-10-CM | POA: Diagnosis not present

## 2017-09-29 DIAGNOSIS — I5030 Unspecified diastolic (congestive) heart failure: Secondary | ICD-10-CM | POA: Diagnosis present

## 2017-09-29 DIAGNOSIS — J9621 Acute and chronic respiratory failure with hypoxia: Secondary | ICD-10-CM | POA: Diagnosis not present

## 2017-09-29 DIAGNOSIS — E785 Hyperlipidemia, unspecified: Secondary | ICD-10-CM | POA: Diagnosis present

## 2017-09-29 DIAGNOSIS — E669 Obesity, unspecified: Secondary | ICD-10-CM

## 2017-09-29 DIAGNOSIS — Z8551 Personal history of malignant neoplasm of bladder: Secondary | ICD-10-CM | POA: Diagnosis not present

## 2017-09-29 DIAGNOSIS — J9601 Acute respiratory failure with hypoxia: Secondary | ICD-10-CM | POA: Diagnosis not present

## 2017-09-29 DIAGNOSIS — Z91041 Radiographic dye allergy status: Secondary | ICD-10-CM | POA: Diagnosis not present

## 2017-09-29 DIAGNOSIS — I10 Essential (primary) hypertension: Secondary | ICD-10-CM | POA: Diagnosis present

## 2017-09-29 DIAGNOSIS — Z90710 Acquired absence of both cervix and uterus: Secondary | ICD-10-CM

## 2017-09-29 DIAGNOSIS — Z9981 Dependence on supplemental oxygen: Secondary | ICD-10-CM

## 2017-09-29 DIAGNOSIS — Z6841 Body Mass Index (BMI) 40.0 and over, adult: Secondary | ICD-10-CM | POA: Diagnosis not present

## 2017-09-29 DIAGNOSIS — M8580 Other specified disorders of bone density and structure, unspecified site: Secondary | ICD-10-CM | POA: Diagnosis present

## 2017-09-29 LAB — CBC WITH DIFFERENTIAL/PLATELET
Basophils Absolute: 0 10*3/uL (ref 0.0–0.1)
Basophils Relative: 0 %
Eosinophils Absolute: 0.1 10*3/uL (ref 0.0–0.7)
Eosinophils Relative: 1 %
HCT: 40.7 % (ref 36.0–46.0)
Hemoglobin: 13.4 g/dL (ref 12.0–15.0)
Lymphocytes Relative: 16 %
Lymphs Abs: 1 10*3/uL (ref 0.7–4.0)
MCH: 28.9 pg (ref 26.0–34.0)
MCHC: 32.9 g/dL (ref 30.0–36.0)
MCV: 87.7 fL (ref 78.0–100.0)
Monocytes Absolute: 0.4 10*3/uL (ref 0.1–1.0)
Monocytes Relative: 7 %
Neutro Abs: 4.4 10*3/uL (ref 1.7–7.7)
Neutrophils Relative %: 76 %
Platelets: 163 10*3/uL (ref 150–400)
RBC: 4.64 MIL/uL (ref 3.87–5.11)
RDW: 16.2 % — ABNORMAL HIGH (ref 11.5–15.5)
WBC: 5.9 10*3/uL (ref 4.0–10.5)

## 2017-09-29 LAB — URINALYSIS, ROUTINE W REFLEX MICROSCOPIC
Bilirubin Urine: NEGATIVE
Glucose, UA: NEGATIVE mg/dL
Hgb urine dipstick: NEGATIVE
Ketones, ur: NEGATIVE mg/dL
Leukocytes, UA: NEGATIVE
Nitrite: NEGATIVE
Protein, ur: 30 mg/dL — AB
Specific Gravity, Urine: 1.018 (ref 1.005–1.030)
pH: 5 (ref 5.0–8.0)

## 2017-09-29 LAB — I-STAT TROPONIN, ED: Troponin i, poc: 0 ng/mL (ref 0.00–0.08)

## 2017-09-29 LAB — GLUCOSE, CAPILLARY
Glucose-Capillary: 258 mg/dL — ABNORMAL HIGH (ref 65–99)
Glucose-Capillary: 291 mg/dL — ABNORMAL HIGH (ref 65–99)

## 2017-09-29 LAB — COMPREHENSIVE METABOLIC PANEL
ALT: 27 U/L (ref 14–54)
AST: 37 U/L (ref 15–41)
Albumin: 4.1 g/dL (ref 3.5–5.0)
Alkaline Phosphatase: 74 U/L (ref 38–126)
Anion gap: 10 (ref 5–15)
BUN: 13 mg/dL (ref 6–20)
CO2: 26 mmol/L (ref 22–32)
Calcium: 9.7 mg/dL (ref 8.9–10.3)
Chloride: 101 mmol/L (ref 101–111)
Creatinine, Ser: 0.69 mg/dL (ref 0.44–1.00)
GFR calc Af Amer: >60 mL/min (ref >60)
GFR calc non Af Amer: >60 mL/min (ref >60)
Glucose, Bld: 154 mg/dL — ABNORMAL HIGH (ref 65–99)
Potassium: 3.6 mmol/L (ref 3.5–5.1)
Sodium: 137 mmol/L (ref 135–145)
Total Bilirubin: 0.9 mg/dL (ref 0.3–1.2)
Total Protein: 7.6 g/dL (ref 6.5–8.1)

## 2017-09-29 LAB — BRAIN NATRIURETIC PEPTIDE: B Natriuretic Peptide: 75.1 pg/mL (ref 0.0–100.0)

## 2017-09-29 MED ORDER — METHYLPREDNISOLONE SODIUM SUCC 125 MG IJ SOLR
60.0000 mg | Freq: Two times a day (BID) | INTRAMUSCULAR | Status: DC
  Administered 2017-09-30 – 2017-10-01 (×3): 60 mg via INTRAVENOUS
  Filled 2017-09-29 (×3): qty 2

## 2017-09-29 MED ORDER — TRAMADOL HCL 50 MG PO TABS: 50.0000 mg | ORAL_TABLET | Freq: Four times a day (QID) | ORAL | Status: DC | PRN

## 2017-09-29 MED ORDER — IPRATROPIUM-ALBUTEROL 0.5-2.5 (3) MG/3ML IN SOLN
3.0000 mL | Freq: Four times a day (QID) | RESPIRATORY_TRACT | Status: DC
  Administered 2017-09-29: 3 mL via RESPIRATORY_TRACT
  Filled 2017-09-29: qty 3

## 2017-09-29 MED ORDER — SIMVASTATIN 20 MG PO TABS
20.0000 mg | ORAL_TABLET | Freq: Every day | ORAL | Status: DC
  Administered 2017-09-29 – 2017-09-30 (×2): 20 mg via ORAL
  Filled 2017-09-29 (×2): qty 1

## 2017-09-29 MED ORDER — AZITHROMYCIN 250 MG PO TABS
500.0000 mg | ORAL_TABLET | Freq: Every day | ORAL | Status: DC
  Administered 2017-09-29 – 2017-10-01 (×3): 500 mg via ORAL
  Filled 2017-09-29 (×3): qty 2

## 2017-09-29 MED ORDER — IPRATROPIUM-ALBUTEROL 0.5-2.5 (3) MG/3ML IN SOLN
3.0000 mL | Freq: Once | RESPIRATORY_TRACT | Status: AC
  Administered 2017-09-29: 3 mL via RESPIRATORY_TRACT
  Filled 2017-09-29: qty 3

## 2017-09-29 MED ORDER — IPRATROPIUM-ALBUTEROL 0.5-2.5 (3) MG/3ML IN SOLN
3.0000 mL | Freq: Three times a day (TID) | RESPIRATORY_TRACT | Status: DC
  Administered 2017-09-30 – 2017-10-01 (×4): 3 mL via RESPIRATORY_TRACT
  Filled 2017-09-29 (×4): qty 3

## 2017-09-29 MED ORDER — ONDANSETRON HCL 4 MG PO TABS: 4.0000 mg | ORAL_TABLET | Freq: Four times a day (QID) | ORAL | Status: DC | PRN

## 2017-09-29 MED ORDER — ENOXAPARIN SODIUM 40 MG/0.4ML ~~LOC~~ SOLN
40.0000 mg | SUBCUTANEOUS | Status: DC
  Administered 2017-09-29 – 2017-09-30 (×2): 40 mg via SUBCUTANEOUS
  Filled 2017-09-29 (×2): qty 0.4

## 2017-09-29 MED ORDER — ALBUTEROL SULFATE (2.5 MG/3ML) 0.083% IN NEBU: 2.5000 mg | INHALATION_SOLUTION | RESPIRATORY_TRACT | Status: DC | PRN

## 2017-09-29 MED ORDER — SENNOSIDES-DOCUSATE SODIUM 8.6-50 MG PO TABS: 1.0000 | ORAL_TABLET | Freq: Every evening | ORAL | Status: DC | PRN

## 2017-09-29 MED ORDER — ONDANSETRON HCL 4 MG/2ML IJ SOLN: 4.0000 mg | Freq: Four times a day (QID) | INTRAMUSCULAR | Status: DC | PRN

## 2017-09-29 MED ORDER — METHYLPREDNISOLONE SODIUM SUCC 125 MG IJ SOLR
125.0000 mg | Freq: Once | INTRAMUSCULAR | Status: AC
  Administered 2017-09-29: 125 mg via INTRAVENOUS
  Filled 2017-09-29: qty 2

## 2017-09-29 MED ORDER — ALBUTEROL SULFATE (2.5 MG/3ML) 0.083% IN NEBU
2.5000 mg | INHALATION_SOLUTION | Freq: Once | RESPIRATORY_TRACT | Status: DC
  Administered 2017-09-29: 2.5 mg via RESPIRATORY_TRACT

## 2017-09-29 MED ORDER — INSULIN ASPART 100 UNIT/ML ~~LOC~~ SOLN
0.0000 [IU] | Freq: Three times a day (TID) | SUBCUTANEOUS | Status: DC
  Administered 2017-09-30: 5 [IU] via SUBCUTANEOUS
  Administered 2017-09-30 (×2): 3 [IU] via SUBCUTANEOUS
  Administered 2017-10-01: 5 [IU] via SUBCUTANEOUS
  Administered 2017-10-01: 2 [IU] via SUBCUTANEOUS

## 2017-09-29 MED ORDER — ASPIRIN EC 81 MG PO TBEC
81.0000 mg | DELAYED_RELEASE_TABLET | Freq: Every day | ORAL | Status: DC
  Administered 2017-09-29 – 2017-10-01 (×3): 81 mg via ORAL
  Filled 2017-09-29 (×3): qty 1

## 2017-09-29 MED ORDER — ACETAMINOPHEN 325 MG PO TABS
650.0000 mg | ORAL_TABLET | Freq: Four times a day (QID) | ORAL | Status: DC | PRN
  Administered 2017-09-29: 650 mg via ORAL
  Filled 2017-09-29: qty 2

## 2017-09-29 MED ORDER — ACETAMINOPHEN 650 MG RE SUPP: 650.0000 mg | Freq: Four times a day (QID) | RECTAL | Status: DC | PRN

## 2017-09-29 MED ORDER — FLUTICASONE-UMECLIDIN-VILANT 100-62.5-25 MCG/INH IN AEPB: INHALATION_SPRAY | Freq: Every day | RESPIRATORY_TRACT | Status: DC

## 2017-09-29 MED ORDER — BISOPROLOL-HYDROCHLOROTHIAZIDE 10-6.25 MG PO TABS
1.0000 | ORAL_TABLET | Freq: Every day | ORAL | Status: DC
  Administered 2017-09-30: 1 via ORAL
  Filled 2017-09-29: qty 1

## 2017-09-29 NOTE — ED Triage Notes (Signed)
Patient arrived via [facility] from doctors office. Patient has a cough that started Saturday night. Non-productive. Was seen at PCP for [facility]. 02 sats were 89% on room air. Hx. Of emphysema and hypertension. Temp--100.6. 1000 mg tylenol. Breath sounds are clear via EMS. Patient is 99% on 2 Litters.

## 2017-09-29 NOTE — ED Notes (Signed)
Report given to Kimberly,RN. [id number].

## 2017-09-29 NOTE — ED Notes (Signed)
Bed: WA20 Expected date:  Expected time:  Means of arrival:  Comments: 72 yo f shortness of breath

## 2017-09-29 NOTE — Progress Notes (Addendum)
Subjective:  Shannon Schultz is a 72 y.o. female who presents for a 3 day history of shortness of breath, cough that is mainly dry, chest tightness, wheezing and feeling feverish. She does have underlying COPD and reports good compliance with daily Trelegy. States she did not use her albuterol inhaler or nebulizer at home. No good reason for not using this.   Denies abdominal pain, N/V/D.   Treatment to date: antihistamines, cough suppressants and decongestants.  Denies sick contacts.  No other aggravating or relieving factors.  No other c/o.  ROS as in subjective.   Objective: Vitals:   09/29/17 1148 09/29/17 1212  BP: 120/70   Pulse: (!) 105   Resp: 18   Temp: 98.4 F (36.9 C)   SpO2: (!) 89% (!) 89%    General appearance: Alert, WD/WN, no distress, mildly ill appearing                             Skin: warm, no rash, pallor                            Head: no sinus tenderness                            Eyes: conjunctiva normal, corneas clear, PERRLA                            Ears: pearly TMs, external ear canals normal                          Nose: septum midline, turbinates swollen, with erythema and clear discharge             Mouth/throat: MMM, tongue normal, mild pharyngeal erythema                           Neck: supple, no adenopathy, no thyromegaly, nontender                          Heart: RRR, normal S1, S2, no murmurs                         Lungs: air movement improved with 2 treatments, wheezes mainly in upper lobes, rales, or rhonchi, diminished in the bases       Assessment: COPD with acute exacerbation (HCC) - Plan: EKG 12-Lead  Hypoxia - Plan: EKG 12-Lead  Chest tightness - Plan: EKG 12-Lead    Plan: 2 breathing treatments in office with minimal improvement via patient and no improvement in oxygen level.  Peak flow improved from 200 to 230 after first treatment. Peak flow decreased to 170 after 2nd treatment.  ECG with slight changes when compared to  old one, consider lead placement issue.  Plan to send her to the ED for further evaluation and treatment. Sending her via EMS. She is ok with this plan.

## 2017-09-29 NOTE — ED Provider Notes (Signed)
[facility] DEPT Provider Note   CSN: [id number] Arrival date & time: 09/29/17  1342     History   Chief Complaint Chief Complaint  Patient presents with  . Shortness of Breath    HPI Shannon Schultz is a 72 y.o. female past medical history of aortic stenosis, COPD, hypertension, diabetes who presents for evaluation of shortness of breath that began today.  Patient reports that over the last 2-3 days, she has had nonproductive cough and rhinorrhea.  Patient reports that today, she became acutely more short of breath.  Patient was seen by her PCP and at her office, her O2 sats were 89% on room air.  Additionally, patient was febrile at 100.6.  Given concerns for fever and hypoxia, patient was transferred to the emergency department for further evaluation.  Patient reports that she does have a history of emphysema disease but does not feel like she has been wheezing.  Patient reports that she use her rescue inhaler last night with no improvement in symptoms.  Patient reports that the cough is worsened with lying supine.  She states that she has had to sleep sitting up over the last several nights secondary to the cough.  Patient reports that she always has some bilateral lower extremity edema states it is slightly worse today.  She denies any increase fluid to abdomen.  Patient reports that she is on a fluid pill intermittently and states that she only takes it when she feels like she needs it.  She reports she has not been taking it over the last several days.  Patient denies any abdominal pain, nausea/vomiting, chest pain, diaphoresis, numbness/weakness of her arms or legs.  The history is provided by the patient.    Past Medical History:  Diagnosis Date  . Aortic stenosis    mild-mod by 04/08/16 echo; mod AS 04/2017  . Arthritis    "hands" (05/15/2017)  . Bladder cancer ([place]) 2018  . Breast cancer, right ([place]) 1992   DCIS,bladder ca (just dx)  . Colon polyp    . Complication of anesthesia 1992   "local anesthesia" used was hard to awaken from-no problems since (05/15/2017)  . COPD (chronic obstructive pulmonary disease) ([place])   . Diverticulosis   . Dyspnea   . Elevated cholesterol   . Elevated liver enzymes    fatty liver per ultrasound per pt  . Environmental allergies    "I have allergies year round" (05/15/2017)  . Family history of malignant neoplasm of breast   . FHx: BRCA2 gene positive    sister with BRCA2 mutation (pt tested NEGATIVE)  . Genital HSV    gets on hip  . Heart murmur     echo 05/2011 mild aortic stenosis  . HSV (herpes simplex virus) infection    on hip--on daily suppression  . Hypertension   . Hypothyroidism    took med 7 yrs after birth of 1st child  . Impaired glucose tolerance   . Migraine   . On home oxygen therapy    "have it available but I'm not using it" (05/15/2017)  . Osteopenia   . Pneumonia ~ 1950; 08/06/2010  . Type 2 diabetes mellitus ([place])   . Ventral hernia   . Vitamin D deficiency disease     Patient Active Problem List   Diagnosis Date Noted  . COPD exacerbation ([place]) 09/29/2017  . Ventral hernia without obstruction or gangrene 05/15/2017  . Cirrhosis of liver without ascites ([place]) 03/11/2017  .  Urothelial cancer ([place]) 03/10/2017  . Atypical ductal hyperplasia of left breast 12/04/2016  . Bladder mass 12/04/2016  . Atherosclerosis of coronary artery 09/14/2016  . Aortic atherosclerosis ([place]) 09/14/2016  . Morbid obesity ([place]) 11/10/2015  . Thrombocytopenia ([place]) 08/17/2015  . Elevated troponin 08/17/2015  . Type 2 diabetes mellitus ([place]) 08/17/2015  . COPD with acute exacerbation ([place]) 08/16/2015  . Aortic stenosis 03/08/2015  . Severe obesity (BMI >= 40) ([place]) 10/06/2014  . FHx: BRCA2 gene positive   . HSV (herpes simplex virus) infection 06/07/2012  . Pure hypercholesterolemia 11/13/2010  . Essential hypertension, benign 11/13/2010    Past Surgical History:  Procedure  Laterality Date  . ABDOMINAL HERNIA REPAIR  05/15/2017  . BREAST BIOPSY Right 1992  . BREAST BIOPSY Left 2018  . BREAST LUMPECTOMY WITH RADIOACTIVE SEED LOCALIZATION Left 12/22/2016   Procedure: LEFT BREAST LUMPECTOMY WITH RADIOACTIVE SEED LOCALIZATION;  Surgeon: Jovita Kussmaul, MD;  Location: [facility];  Service: General;  Laterality: Left;  . COLONOSCOPY  2009, 08/2010, 12/2015   Dr. Collene Mares; "only 1 had any polyps" (05/15/2017)  . CYSTOSCOPY  04/23/2017  . CYSTOSCOPY W/ RETROGRADES Bilateral 01/12/2017   Procedure: CYSTOSCOPY WITH RETROGRADE PYELOGRAM/ EXAM UNDER ANESTHESIA;  Surgeon: Raynelle Bring, MD;  Location: [facility];  Service: Urology;  Laterality: Bilateral;  . HERNIA REPAIR    . INSERTION OF MESH N/A 05/15/2017   Procedure: INSERTION OF MESH;  Surgeon: Jovita Kussmaul, MD;  Location: [facility];  Service: General;  Laterality: N/A;  . LAPAROSCOPIC CHOLECYSTECTOMY  2003  . MASTECTOMY Right 1992  . TRANSURETHRAL RESECTION OF BLADDER TUMOR WITH MITOMYCIN-C N/A 01/12/2017   Procedure: TRANSURETHRAL RESECTION OF BLADDER TUMOR WITH POSSIBLE POST OPERATIVE INSTILLATION OF MITOMYCIN-C;  Surgeon: Raynelle Bring, MD;  Location: [facility];  Service: Urology;  Laterality: N/A;  . TYMPANOSTOMY TUBE PLACEMENT Bilateral 1980s  . UMBILICAL HERNIA REPAIR  2003  . VENTRAL HERNIA REPAIR N/A 05/15/2017   Procedure: VENTRAL HERNIA REPAIR WITH MESH;  Surgeon: Jovita Kussmaul, MD;  Location: [facility];  Service: General;  Laterality: N/A;     OB History    Gravida  3   Para  3   Term      Preterm      AB      Living  3     SAB      TAB      Ectopic      Multiple      Live Births               Home Medications    Prior to Admission medications   Medication Sig Start Date End Date Taking? Authorizing Provider  acetaminophen (TYLENOL) 500 MG tablet Take 1,000 mg every 6 (six) hours as needed by mouth for moderate pain or headache.     [provider]  albuterol (PROVENTIL HFA;VENTOLIN HFA)  108 (90 Base) MCG/ACT inhaler Inhale 2 puffs every 6 (six) hours as needed into the lungs for wheezing or shortness of breath.    [provider]  albuterol (PROVENTIL) (2.5 MG/3ML) 0.083% nebulizer solution USE 1 VIAL BY NEBULIZATION EVERY FOUR HOURS AS NEEDED FOR WHEEZING OR SHORTNESS OF BREATH. 10/15/15   [name], MD  aspirin EC 81 MG tablet Take 81 mg daily by mouth.    [provider]  bisoprolol-hydrochlorothiazide [facility]) 10-6.25 MG tablet TAKE 1 TABLET EVERY DAY 07/29/17   [name], MD  Calcium Carbonate-Vitamin D (CALCIUM 600+D) 600-400 MG-UNIT per tablet Take 1  tablet by mouth daily.     [provider]  cetirizine (KLS ALLER-TEC) 10 MG tablet Take 10 mg by mouth daily.    [provider]  Cholecalciferol (EQL VITAMIN D3) 2000 units CAPS Take 4,000 Units daily by mouth.    [provider]  fluticasone (FLONASE) 50 MCG/ACT nasal spray USE 2 SPRAYS IN EACH NOSTRIL EVERY DAY 09/07/17   [name], MD  furosemide (LASIX) 20 MG tablet Take 1 tablet (20 mg total) by mouth daily as needed for fluid or edema. Patient not taking: Reported on 09/29/2017 08/27/17   [name], MD  metFORMIN (GLUCOPHAGE-XR) 500 MG 24 hr tablet TAKE 1 TABLET THREE TIMES DAILY 09/07/17   [name], MD  Multiple Vitamins-Minerals (CENTRUM SILVER PO) Take 1 tablet by mouth daily.     [provider]  simvastatin (ZOCOR) 20 MG tablet TAKE 1 TABLET AT BEDTIME 09/21/17   [name], MD  TRELEGY ELLIPTA 100-62.5-25 MCG/INH AEPB INHALE 1 PUFF INTO THE LUNGS EVERY DAY 09/11/17   Mannam, Hart Robinsons, MD  triamcinolone cream (KENALOG) 0.1 % Apply 1 application 2 (two) times daily as needed topically (for dark red areas on leg).    [provider]  TRUEPLUS LANCETS 33G [place] TEST ONE TIME DAILY 05/13/16   [name], MD  valACYclovir (VALTREX) 500 MG tablet TAKE 1 TABLET BY MOUTH EVERY DAY 09/16/17   [name], MD    Family History Family History  Problem Relation Age of  Onset  . Heart disease Father   . Diabetes Father   . Hypertension Father   . Asthma Sister   . Allergies Sister   . Hyperlipidemia Sister   . Hashimoto's thyroiditis Sister   . Fibromyalgia Sister   . Heart disease Mother        tachycardia  . Asthma Sister   . Allergies Sister   . Hyperlipidemia Sister   . Hashimoto's thyroiditis Sister   . Fibromyalgia Sister   . Breast cancer Sister 45       BRCA2 positive; metastatic to bones at age 58, then spread to liver  . Cancer Other        female cancers, bone cancer  . Cancer Maternal Grandmother        deceased 24; unk. primary; possibly stomach  . Tuberculosis Maternal Grandfather   . Stroke Paternal Grandmother 37       died of cerebral hemorrhage    Social History Social History   Tobacco Use  . Smoking status: Former Smoker    Packs/day: 1.00    Years: 46.00    Pack years: 46.00    Types: Cigarettes    Last attempt to quit: 05/31/2011    Years since quitting: 6.3  . Smokeless tobacco: Never Used  Substance Use Topics  . Alcohol use: No    Comment: 05/15/2017 "1 drink per year maybe"  . Drug use: No     Allergies   Contrast media [iodinated diagnostic agents]; Iohexol; Lisinopril; Sulfa antibiotics; Latex; Codeine; Levofloxacin; Lipitor [atorvastatin calcium]; and Vicodin [hydrocodone-acetaminophen]   Review of Systems Review of Systems  Constitutional: Positive for fever.  HENT: Negative for congestion.   Respiratory: Positive for cough and shortness of breath.   Cardiovascular: Positive for leg swelling. Negative for chest pain.  Gastrointestinal: Negative for abdominal pain, nausea and vomiting.  Genitourinary: Negative for dysuria and hematuria.  Neurological: Negative for headaches.  All other systems reviewed and are negative.    Physical Exam Updated Vital Signs  BP 136/63 (BP Location: Left Arm)   Pulse 92   Temp 98.7 F (37.1 C) (Oral)   Resp 18   LMP  (LMP Unknown)   SpO2 93%   Physical  Exam  Constitutional: She is oriented to person, place, and time. She appears well-developed and well-nourished.  HENT:  Head: Normocephalic and atraumatic.  Mouth/Throat: Oropharynx is clear and moist and mucous membranes are normal.  Eyes: Pupils are equal, round, and reactive to light. Conjunctivae, EOM and lids are normal.  Neck: Full passive range of motion without pain.  Cardiovascular: Normal rate, regular rhythm, normal heart sounds and normal pulses. Exam reveals no gallop and no friction rub.  No murmur heard. Pulmonary/Chest: Effort normal and breath sounds normal.  No evidence of respiratory distress. Able to speak in full sentences without difficulty.  Abdominal: Soft. Normal appearance. There is no tenderness. There is no rigidity and no guarding.  Musculoskeletal: Normal range of motion.  1+ pitting edema noted to the mid tib/fib that extends distally.   Neurological: She is alert and oriented to person, place, and time.  Skin: Skin is warm and dry. Capillary refill takes less than 2 seconds.  Chronic venous stasis changes noted to BLE  Psychiatric: She has a normal mood and affect. Her speech is normal.  Nursing note and vitals reviewed.    ED Treatments / Results  Labs (all labs ordered are listed, but only abnormal results are displayed) Labs Reviewed  COMPREHENSIVE METABOLIC PANEL - Abnormal; Notable for the following components:      Result Value   Glucose, Bld 154 (*)    All other components within normal limits  CBC WITH DIFFERENTIAL/PLATELET - Abnormal; Notable for the following components:   RDW 16.2 (*)    All other components within normal limits  URINALYSIS, ROUTINE W REFLEX MICROSCOPIC - Abnormal; Notable for the following components:   Protein, ur 30 (*)    Bacteria, UA RARE (*)    Squamous Epithelial / LPF 0-5 (*)    All other components within normal limits  BRAIN NATRIURETIC PEPTIDE  I-STAT TROPONIN, ED    EKG EKG  Interpretation  Date/Time:  Tuesday September 29 2017 14:09:22 EDT Ventricular Rate:  101 PR Interval:    QRS Duration: 85 QT Interval:  349 QTC Calculation: 453 R Axis:   55 Text Interpretation:  Sinus tachycardia Anterior infarct, old Confirmed by Isla Pence [id number]) on 09/29/2017 2:48:20 PM   Radiology Dg Chest 2 View  Result Date: 09/29/2017 CLINICAL DATA:  Cough over the last 4 days.  Shortness of breath. EXAM: CHEST - 2 VIEW COMPARISON:  04/22/2017 FINDINGS: Heart size is normal. Chronic aortic atherosclerosis. Chronic interstitial lung markings but no sign of active infiltrate, mass, effusion or collapse. Previous chest wall surgery on the right. IMPRESSION: No active cardiopulmonary disease. Electronically Signed   By: Nelson Chimes M.D.   On: 09/29/2017 15:03    Procedures Procedures (including critical care time)  Medications Ordered in ED Medications  methylPREDNISolone sodium succinate (SOLU-MEDROL) 125 mg/2 mL injection 125 mg (125 mg Intravenous Given 09/29/17 1530)  ipratropium-albuterol (DUONEB) 0.5-2.5 (3) MG/3ML nebulizer solution 3 mL (3 mLs Nebulization Given 09/29/17 1530)     Initial Impression / Assessment and Plan / ED Course  I have reviewed the triage vital signs and the nursing notes.  Pertinent labs & imaging results that were available during my care of the patient were reviewed by me and considered in my medical decision making (see chart for  details).     72 y.o. F with PMH/o Aortic Stenosis, HTN, COPD who presents for evaluation of SOB that began today.  Recently had a cough for the last couple days.  Worsened when laying supine.  Has had to sit up.  Also reporting some worsening bilateral lower extremity edema.  Went to PCP office for evaluation of symptoms and was 89% on room air.  Initially had a temperature of 100.6.  Was given Tylenol prehospital. Patient is afebrile, non-toxic appearing, sitting comfortably on examination table. Vital signs reviewed.   Patient is slightly tachycardic.  Consider COPD exacerbation vs infectious etiology versus CHF versus ACS etiology.  Patient does have a history of bladder cancer in 2018 but had the lesion removed and was treated with chemo.  Does not currently have cancer.  Do not suspect PE given history/physical exam.  Plan to check basic labs, EKG, chest x-ray. Plan for steroids and nebulizer.   BMP within normal limits.  Troponin negative.  CMP unremarkable.  UA negative for any acute infectious etiology.  CBC shows no evidence of leukocytosis, anemia.  Chest x-ray negative for any acute infectious etiology.   Patient's O2 sats dropped down into the 80s when removed from the 2 L O2.  Given oxygen requirement here in the ED, will plan for admission for COPD exacerbation.  Discussed patient with Dr. Maylene Roes (hospitalist). Will admit for COPD exacerbation.   Final Clinical Impressions(s) / ED Diagnoses   Final diagnoses:  COPD exacerbation ([place])  SOB (shortness of breath)    ED Discharge Orders    None       Desma Mcgregor 09/29/17 2316    Isla Pence, MD 10/01/17 [phone]

## 2017-09-29 NOTE — ED Notes (Signed)
ED TO INPATIENT HANDOFF REPORT  Name/Age/Gender Shannon Schultz 72 y.o. female  Code Status Code Status History    Date Active Date Inactive Code Status Order ID Comments User Context   05/15/2017 1225 05/18/2017 1702 Full Code [id number]  Jovita Kussmaul, MD Inpatient   08/16/2015 0905 08/18/2015 1640 Full Code [id number]  Reyne Dumas, MD Inpatient   06/07/2011 1303 06/11/2011 1801 Full Code [id number]  Barton Dubois, MD Inpatient    Advance Directive Documentation     Most Recent Value  Type of Advance Directive  Healthcare Power of Attorney, Living will  Pre-existing out of facility DNR order (yellow form or pink MOST form)  -  "MOST" Form in Place?  -      Home/SNF/Other Home  Chief Complaint low 02  Level of Care/Admitting Diagnosis ED Disposition    ED Disposition Condition [place]: [facility] [100102]  Level of Care: Telemetry [5]  Admit to tele based on following criteria: Complex arrhythmia (Bradycardia/Tachycardia)  Diagnosis: COPD exacerbation [facility]) [[id number]]  Admitting Physician: Dessa Phi [phone]  Attending Physician: Dessa Phi [phone]  PT Class (Do Not Modify): Observation [104]  PT Acc Code (Do Not Modify): Observation [10022]       Medical History Past Medical History:  Diagnosis Date  . Aortic stenosis    mild-mod by 04/08/16 echo; mod AS 04/2017  . Arthritis    "hands" (05/15/2017)  . Bladder cancer ([place]) 2018  . Breast cancer, right ([place]) 1992   DCIS,bladder ca (just dx)  . Colon polyp   . Complication of anesthesia 1992   "local anesthesia" used was hard to awaken from-no problems since (05/15/2017)  . COPD (chronic obstructive pulmonary disease) ([place])   . Diverticulosis   . Dyspnea   . Elevated cholesterol   . Elevated liver enzymes    fatty liver per ultrasound per pt  . Environmental allergies    "I have allergies year round" (05/15/2017)  . Family history of malignant neoplasm of  breast   . FHx: BRCA2 gene positive    sister with BRCA2 mutation (pt tested NEGATIVE)  . Genital HSV    gets on hip  . Heart murmur     echo 05/2011 mild aortic stenosis  . HSV (herpes simplex virus) infection    on hip--on daily suppression  . Hypertension   . Hypothyroidism    took med 7 yrs after birth of 1st child  . Impaired glucose tolerance   . Migraine   . On home oxygen therapy    "have it available but I'm not using it" (05/15/2017)  . Osteopenia   . Pneumonia ~ 1950; 08/06/2010  . Type 2 diabetes mellitus ([place])   . Ventral hernia   . Vitamin D deficiency disease     Allergies Allergies  Allergen Reactions  . Contrast Media [Iodinated Diagnostic Agents] Anaphylaxis, Shortness Of Breath and Other (See Comments)    Could not breath  . Iohexol Anaphylaxis, Shortness Of Breath and Other (See Comments)    Immediately could not breathe  . Lisinopril Anaphylaxis, Shortness Of Breath and Rash  . Sulfa Antibiotics Anaphylaxis and Other (See Comments)    Historical from mother, pt states that mother says she almost died from this drug  . Latex Other (See Comments)    Unless against on skin for a long time, blisters. Short term is okay.  . Codeine Nausea Only, Anxiety and Other (See Comments)    insomnia  .  Levofloxacin Other (See Comments)    insomnia  . Lipitor [Atorvastatin Calcium] Rash  . Vicodin [Hydrocodone-Acetaminophen] Itching    IV Location/Drains/Wounds Patient Lines/Drains/Airways Status   Active Line/Drains/Airways    Name:   Placement date:   Placement time:   Site:   Days:   Peripheral IV 09/29/17 Left Antecubital   09/29/17    1400    Antecubital   less than 1   Open Drain 1 Left;Inferior Abdomen 19 Fr.   05/15/17    0948    Abdomen   137   Incision (Closed) 12/22/16 Breast Left   12/22/16    0940     281   Incision (Closed) 05/15/17 Abdomen Other (Comment)   05/15/17    0955     137          Labs/Imaging Results for orders placed or performed  during the hospital encounter of 09/29/17 (from the past 48 hour(s))  Comprehensive metabolic panel     Status: Abnormal   Collection Time: 09/29/17  2:03 PM  Result Value Ref Range   Sodium 137 135 - 145 mmol/L   Potassium 3.6 3.5 - 5.1 mmol/L   Chloride 101 101 - 111 mmol/L   CO2 26 22 - 32 mmol/L   Glucose, Bld 154 (H) 65 - 99 mg/dL   BUN 13 6 - 20 mg/dL   Creatinine, Ser 0.69 0.44 - 1.00 mg/dL   Calcium 9.7 8.9 - 10.3 mg/dL   Total Protein 7.6 6.5 - 8.1 g/dL   Albumin 4.1 3.5 - 5.0 g/dL   AST 37 15 - 41 U/L   ALT 27 14 - 54 U/L   Alkaline Phosphatase 74 38 - 126 U/L   Total Bilirubin 0.9 0.3 - 1.2 mg/dL   GFR calc non Af Amer >60 >60 mL/min   GFR calc Af Amer >60 >60 mL/min    Comment: (NOTE) The eGFR has been calculated using the CKD EPI equation. This calculation has not been validated in all clinical situations. eGFR's persistently <60 mL/min signify possible Chronic Kidney Disease.    Anion gap 10 5 - 15    Comment: Performed at [facility], [place] [address], [place], [place] [address]  CBC with Differential     Status: Abnormal   Collection Time: 09/29/17  2:03 PM  Result Value Ref Range   WBC 5.9 4.0 - 10.5 K/uL   RBC 4.64 3.87 - 5.11 MIL/uL   Hemoglobin 13.4 12.0 - 15.0 g/dL   HCT 40.7 36.0 - 46.0 %   MCV 87.7 78.0 - 100.0 fL   MCH 28.9 26.0 - 34.0 pg   MCHC 32.9 30.0 - 36.0 g/dL   RDW 16.2 (H) 11.5 - 15.5 %   Platelets 163 150 - 400 K/uL   Neutrophils Relative % 76 %   Neutro Abs 4.4 1.7 - 7.7 K/uL   Lymphocytes Relative 16 %   Lymphs Abs 1.0 0.7 - 4.0 K/uL   Monocytes Relative 7 %   Monocytes Absolute 0.4 0.1 - 1.0 K/uL   Eosinophils Relative 1 %   Eosinophils Absolute 0.1 0.0 - 0.7 K/uL   Basophils Relative 0 %   Basophils Absolute 0.0 0.0 - 0.1 K/uL    Comment: Performed at [facility], [place] [address] [place], [place] [address]  Urinalysis, Routine w reflex microscopic     Status: Abnormal   Collection Time:  09/29/17  2:03 PM  Result Value Ref Range   Color,  Urine YELLOW YELLOW   APPearance CLEAR CLEAR   Specific Gravity, Urine 1.018 1.005 - 1.030   pH 5.0 5.0 - 8.0   Glucose, UA NEGATIVE NEGATIVE mg/dL   Hgb urine dipstick NEGATIVE NEGATIVE   Bilirubin Urine NEGATIVE NEGATIVE   Ketones, ur NEGATIVE NEGATIVE mg/dL   Protein, ur 30 (A) NEGATIVE mg/dL   Nitrite NEGATIVE NEGATIVE   Leukocytes, UA NEGATIVE NEGATIVE   RBC / HPF 0-5 0 - 5 RBC/hpf   WBC, UA 6-30 0 - 5 WBC/hpf   Bacteria, UA RARE (A) NONE SEEN   Squamous Epithelial / LPF 0-5 (A) NONE SEEN   Mucus PRESENT     Comment: Performed at [facility], [place] [address] [place], [place] [address]  Brain natriuretic peptide     Status: None   Collection Time: 09/29/17  2:04 PM  Result Value Ref Range   B Natriuretic Peptide 75.1 0.0 - 100.0 pg/mL    Comment: Performed at [facility], [place] [address], [place], [place] [address]  I-Stat Troponin, ED (not at [facility])     Status: None   Collection Time: 09/29/17  2:13 PM  Result Value Ref Range   Troponin i, poc 0.00 0.00 - 0.08 ng/mL   Comment 3            Comment: Due to the release kinetics of cTnI, a negative result within the first hours of the onset of symptoms does not rule out myocardial infarction with certainty. If myocardial infarction is still suspected, repeat the test at appropriate intervals.    Dg Chest 2 View  Result Date: 09/29/2017 CLINICAL DATA:  Cough over the last 4 days.  Shortness of breath. EXAM: CHEST - 2 VIEW COMPARISON:  04/22/2017 FINDINGS: Heart size is normal. Chronic aortic atherosclerosis. Chronic interstitial lung markings but no sign of active infiltrate, mass, effusion or collapse. Previous chest wall surgery on the right. IMPRESSION: No active cardiopulmonary disease. Electronically Signed   By: Nelson Chimes M.D.   On: 09/29/2017 15:03    Pending Labs Unresulted Labs (From admission, onward)   None       Vitals/Pain Today's Vitals   09/29/17 1400 09/29/17 1415 09/29/17 1500 09/29/17 1630  BP: 137/68  136/63 125/61  Pulse: (!) 106 100 92 91  Resp: '16 16 18 16  ' Temp:      TempSrc:      SpO2: (!) 86% (!) 89% 93% 95%    Isolation Precautions No active isolations  Medications Medications  methylPREDNISolone sodium succinate (SOLU-MEDROL) 125 mg/2 mL injection 125 mg (125 mg Intravenous Given 09/29/17 1530)  ipratropium-albuterol (DUONEB) 0.5-2.5 (3) MG/3ML nebulizer solution 3 mL (3 mLs Nebulization Given 09/29/17 1530)    Mobility walks

## 2017-09-29 NOTE — Addendum Note (Signed)
Addended by: Minette Headland A on: 09/29/2017 01:59 PM   Modules accepted: Orders

## 2017-09-29 NOTE — H&P (Signed)
History and Physical    Shannon Schultz QAS:[id number] DOB: 1946-05-19 DOA: 09/29/2017  PCP: [name], MD  Patient coming from: PCP office   Chief Complaint: Shortness of breath   HPI: Shannon Schultz is a 72 y.o. female with medical history significant of essential hypertension, diabetes, hyperlipidemia, COPD who presents with shortness of breath, dry cough since Saturday.  She was seen by her primary care physician's office this afternoon; they gave 2 breathing treatments in office with minimal improvement.  She was noted to be hypoxic with pulse ox 89% on room air.  Patient was referred to the emergency department. She does not wear oxygen at home.  She denies any fevers at home, chills, chest pain, nausea, vomiting, diarrhea, abdominal pain.  ED Course: CXR negative for infiltrate. Patient required [place] O2 to maintain sats. Given solumedrol and duonebs.   Review of Systems: As per HPI otherwise 10 point review of systems negative.   Past Medical History:  Diagnosis Date  . Aortic stenosis    mild-mod by 04/08/16 echo; mod AS 04/2017  . Arthritis    "hands" (05/15/2017)  . Bladder cancer ([place]) 2018  . Breast cancer, right ([place]) 1992   DCIS,bladder ca (just dx)  . Colon polyp   . Complication of anesthesia 1992   "local anesthesia" used was hard to awaken from-no problems since (05/15/2017)  . COPD (chronic obstructive pulmonary disease) ([place])   . Diverticulosis   . Dyspnea   . Elevated cholesterol   . Elevated liver enzymes    fatty liver per ultrasound per pt  . Environmental allergies    "I have allergies year round" (05/15/2017)  . Family history of malignant neoplasm of breast   . FHx: BRCA2 gene positive    sister with BRCA2 mutation (pt tested NEGATIVE)  . Genital HSV    gets on hip  . Heart murmur     echo 05/2011 mild aortic stenosis  . HSV (herpes simplex virus) infection    on hip--on daily suppression  . Hypertension   . Hypothyroidism    took med 7 yrs after  birth of 1st child  . Impaired glucose tolerance   . Migraine   . On home oxygen therapy    "have it available but I'm not using it" (05/15/2017)  . Osteopenia   . Pneumonia ~ 1950; 08/06/2010  . Type 2 diabetes mellitus ([place])   . Ventral hernia   . Vitamin D deficiency disease     Past Surgical History:  Procedure Laterality Date  . ABDOMINAL HERNIA REPAIR  05/15/2017  . BREAST BIOPSY Right 1992  . BREAST BIOPSY Left 2018  . BREAST LUMPECTOMY WITH RADIOACTIVE SEED LOCALIZATION Left 12/22/2016   Procedure: LEFT BREAST LUMPECTOMY WITH RADIOACTIVE SEED LOCALIZATION;  Surgeon: Jovita Kussmaul, MD;  Location: [place];  Service: General;  Laterality: Left;  . COLONOSCOPY  2009, 08/2010, 12/2015   Dr. Collene Mares; "only 1 had any polyps" (05/15/2017)  . CYSTOSCOPY  04/23/2017  . CYSTOSCOPY W/ RETROGRADES Bilateral 01/12/2017   Procedure: CYSTOSCOPY WITH RETROGRADE PYELOGRAM/ EXAM UNDER ANESTHESIA;  Surgeon: Raynelle Bring, MD;  Location: [facility];  Service: Urology;  Laterality: Bilateral;  . HERNIA REPAIR    . INSERTION OF MESH N/A 05/15/2017   Procedure: INSERTION OF MESH;  Surgeon: Jovita Kussmaul, MD;  Location: [place];  Service: General;  Laterality: N/A;  . LAPAROSCOPIC CHOLECYSTECTOMY  2003  . MASTECTOMY Right 1992  . TRANSURETHRAL RESECTION OF BLADDER TUMOR WITH MITOMYCIN-C N/A  01/12/2017   Procedure: TRANSURETHRAL RESECTION OF BLADDER TUMOR WITH POSSIBLE POST OPERATIVE INSTILLATION OF MITOMYCIN-C;  Surgeon: Raynelle Bring, MD;  Location: [facility];  Service: Urology;  Laterality: N/A;  . TYMPANOSTOMY TUBE PLACEMENT Bilateral 1980s  . UMBILICAL HERNIA REPAIR  2003  . VENTRAL HERNIA REPAIR N/A 05/15/2017   Procedure: VENTRAL HERNIA REPAIR WITH MESH;  Surgeon: Jovita Kussmaul, MD;  Location: [place];  Service: General;  Laterality: N/A;     reports that she quit smoking about 6 years ago. Her smoking use included cigarettes. She has a 46.00 pack-year smoking history. She has never used smokeless tobacco.  She reports that she does not drink alcohol or use drugs.  Allergies  Allergen Reactions  . Contrast Media [Iodinated Diagnostic Agents] Anaphylaxis, Shortness Of Breath and Other (See Comments)    Could not breath  . Iohexol Anaphylaxis, Shortness Of Breath and Other (See Comments)    Immediately could not breathe  . Lisinopril Anaphylaxis, Shortness Of Breath and Rash  . Sulfa Antibiotics Anaphylaxis and Other (See Comments)    Historical from mother, pt states that mother says she almost died from this drug  . Latex Other (See Comments)    Unless against on skin for a long time, blisters. Short term is okay.  . Codeine Nausea Only, Anxiety and Other (See Comments)    insomnia  . Levofloxacin Other (See Comments)    insomnia  . Lipitor [Atorvastatin Calcium] Rash  . Vicodin [Hydrocodone-Acetaminophen] Itching    Family History  Problem Relation Age of Onset  . Heart disease Father   . Diabetes Father   . Hypertension Father   . Asthma Sister   . Allergies Sister   . Hyperlipidemia Sister   . Hashimoto's thyroiditis Sister   . Fibromyalgia Sister   . Heart disease Mother        tachycardia  . Asthma Sister   . Allergies Sister   . Hyperlipidemia Sister   . Hashimoto's thyroiditis Sister   . Fibromyalgia Sister   . Breast cancer Sister 45       BRCA2 positive; metastatic to bones at age 81, then spread to liver  . Cancer Other        female cancers, bone cancer  . Cancer Maternal Grandmother        deceased 87; unk. primary; possibly stomach  . Tuberculosis Maternal Grandfather   . Stroke Paternal Grandmother 50       died of cerebral hemorrhage    Prior to Admission medications   Medication Sig Start Date End Date Taking? Authorizing Provider  acetaminophen (TYLENOL) 500 MG tablet Take 1,000 mg every 6 (six) hours as needed by mouth for moderate pain or headache.     [provider]  albuterol (PROVENTIL HFA;VENTOLIN HFA) 108 (90 Base) MCG/ACT inhaler  Inhale 2 puffs every 6 (six) hours as needed into the lungs for wheezing or shortness of breath.    [provider]  albuterol (PROVENTIL) (2.5 MG/3ML) 0.083% nebulizer solution USE 1 VIAL BY NEBULIZATION EVERY FOUR HOURS AS NEEDED FOR WHEEZING OR SHORTNESS OF BREATH. 10/15/15   [name], MD  aspirin EC 81 MG tablet Take 81 mg daily by mouth.    [provider]  bisoprolol-hydrochlorothiazide [facility]) 10-6.25 MG tablet TAKE 1 TABLET EVERY DAY 07/29/17   [name], MD  Calcium Carbonate-Vitamin D (CALCIUM 600+D) 600-400 MG-UNIT per tablet Take 1 tablet by mouth daily.     [provider]  cetirizine (KLS ALLER-TEC)  10 MG tablet Take 10 mg by mouth daily.    [provider]  Cholecalciferol (EQL VITAMIN D3) 2000 units CAPS Take 4,000 Units daily by mouth.    [provider]  fluticasone (FLONASE) 50 MCG/ACT nasal spray USE 2 SPRAYS IN EACH NOSTRIL EVERY DAY 09/07/17   [name], MD  furosemide (LASIX) 20 MG tablet Take 1 tablet (20 mg total) by mouth daily as needed for fluid or edema. Patient not taking: Reported on 09/29/2017 08/27/17   [name], MD  metFORMIN (GLUCOPHAGE-XR) 500 MG 24 hr tablet TAKE 1 TABLET THREE TIMES DAILY 09/07/17   [name], MD  Multiple Vitamins-Minerals (CENTRUM SILVER PO) Take 1 tablet by mouth daily.     [provider]  simvastatin (ZOCOR) 20 MG tablet TAKE 1 TABLET AT BEDTIME 09/21/17   [name], MD  TRELEGY ELLIPTA 100-62.5-25 MCG/INH AEPB INHALE 1 PUFF INTO THE LUNGS EVERY DAY 09/11/17   Mannam, Hart Robinsons, MD  triamcinolone cream (KENALOG) 0.1 % Apply 1 application 2 (two) times daily as needed topically (for dark red areas on leg).    [provider]  TRUEPLUS LANCETS 33G MISC TEST ONE TIME DAILY 05/13/16   [name], MD  valACYclovir (VALTREX) 500 MG tablet TAKE 1 TABLET BY MOUTH EVERY DAY 09/16/17   [name], MD    Physical Exam: Vitals:   09/29/17 1500 09/29/17 1530 09/29/17 1630 09/29/17 1700  BP:  136/63  125/61 127/65  Pulse: 92  91 82  Resp: '18  16 17  ' Temp:      TempSrc:      SpO2: 93% 96% 95% 93%     Constitutional: NAD, calm, comfortable Eyes: PERRL, lids and conjunctivae normal ENMT: Mucous membranes are moist. Posterior pharynx clear of any exudate or lesions.Normal dentition.  Neck: normal, supple, no masses, no thyromegaly Respiratory: Diminished breath sounds bilaterally. Normal respiratory effort. No accessory muscle use.  Cardiovascular: Regular rate and rhythm, no murmurs / rubs / gallops.  Trace extremity edema. Abdomen: no tenderness, no masses palpated. No hepatosplenomegaly. Bowel sounds positive.  Musculoskeletal: no clubbing / cyanosis. No joint deformity upper and lower extremities. Good ROM, no contractures. Normal muscle tone.  Skin: no rashes, lesions, ulcers. No induration. Chronic venous stasis changes bilateral lower extremities Neurologic: CN 2-12 grossly intact. Strength 5/5 in all 4.  Psychiatric: Normal judgment and insight. Alert and oriented x 3. Normal mood.   Labs on Admission: I have personally reviewed following labs and imaging studies  CBC: Recent Labs  Lab 09/29/17 1403  WBC 5.9  NEUTROABS 4.4  HGB 13.4  HCT 40.7  MCV 87.7  PLT 308   Basic Metabolic Panel: Recent Labs  Lab 09/29/17 1403  NA 137  K 3.6  CL 101  CO2 26  GLUCOSE 154*  BUN 13  CREATININE 0.69  CALCIUM 9.7   GFR: Estimated Creatinine Clearance: 78.5 mL/min (by C-G formula based on SCr of 0.69 mg/dL). Liver Function Tests: Recent Labs  Lab 09/29/17 1403  AST 37  ALT 27  ALKPHOS 74  BILITOT 0.9  PROT 7.6  ALBUMIN 4.1   No results for input(s): LIPASE, AMYLASE in the last 168 hours. No results for input(s): AMMONIA in the last 168 hours. Coagulation Profile: No results for input(s): INR, PROTIME in the last 168 hours. Cardiac Enzymes: No results for input(s): CKTOTAL, CKMB, CKMBINDEX, TROPONINI in the last 168 hours. BNP (last 3 results) No  results for input(s): PROBNP in the last 8760 hours. HbA1C: No  results for input(s): HGBA1C in the last 72 hours. CBG: No results for input(s): GLUCAP in the last 168 hours. Lipid Profile: No results for input(s): CHOL, HDL, LDLCALC, TRIG, CHOLHDL, LDLDIRECT in the last 72 hours. Thyroid Function Tests: No results for input(s): TSH, T4TOTAL, FREET4, T3FREE, THYROIDAB in the last 72 hours. Anemia Panel: No results for input(s): VITAMINB12, FOLATE, FERRITIN, TIBC, IRON, RETICCTPCT in the last 72 hours. Urine analysis:    Component Value Date/Time   COLORURINE YELLOW 09/29/2017 [place] 09/29/2017 1403   LABSPEC 1.018 09/29/2017 1403   PHURINE 5.0 09/29/2017 1403   GLUCOSEU NEGATIVE 09/29/2017 1403   HGBUR NEGATIVE 09/29/2017 1403   BILIRUBINUR NEGATIVE 09/29/2017 1403   BILIRUBINUR neg 10/08/2015 1355   KETONESUR NEGATIVE 09/29/2017 1403   PROTEINUR 30 (A) 09/29/2017 1403   UROBILINOGEN negative 10/08/2015 1355   UROBILINOGEN 1.0 06/06/2011 1733   NITRITE NEGATIVE 09/29/2017 1403   LEUKOCYTESUR NEGATIVE 09/29/2017 1403   Sepsis Labs: !!!!!!!!!!!!!!!!!!!!!!!!!!!!!!!!!!!!!!!!!!!! '@LABRCNTIP' (procalcitonin:4,lacticidven:4) )No results found for this or any previous visit (from the past 240 hour(s)).   Radiological Exams on Admission: Dg Chest 2 View  Result Date: 09/29/2017 CLINICAL DATA:  Cough over the last 4 days.  Shortness of breath. EXAM: CHEST - 2 VIEW COMPARISON:  04/22/2017 FINDINGS: Heart size is normal. Chronic aortic atherosclerosis. Chronic interstitial lung markings but no sign of active infiltrate, mass, effusion or collapse. Previous chest wall surgery on the right. IMPRESSION: No active cardiopulmonary disease. Electronically Signed   By: Nelson Chimes M.D.   On: 09/29/2017 15:03    EKG: Independently reviewed.  Sinus tachycardia  Assessment/Plan Principal Problem:   Acute hypoxemic respiratory failure (HCC) Active Problems:   Essential  hypertension, benign   COPD with acute exacerbation (HCC)   Type 2 diabetes mellitus ([place])   Morbid obesity (HCC)   Acute hypoxemic respiratory failure  Continue nasal cannula O2 to maintain oxygen sat 88-92%, wean as able  COPD exacerbation Solu-Medrol, nebs, azithromycin  Essential hypertension Continue bisoprolol/hydrochlorothiazide  Diabetes mellitus Hold metformin Sliding scale insulin  Hyperlipidemia Continue Zocor   DVT prophylaxis: Lovenox Code Status: Full  Family Communication: At bedside Disposition Plan: Pending improvement, home when clinically improved  Consults called: None Admission status: Observation  The appropriate patient status for this patient is OBSERVATION. Observation status is judged to be reasonable and necessary in order to provide the required intensity of service to ensure the patient's safety. The patient's presenting symptoms, physical exam findings, and initial radiographic and laboratory data in the context of their medical condition is felt to place them at decreased risk for further clinical deterioration. Furthermore, it is anticipated that the patient will be medically stable for discharge from the hospital within 2 midnights of admission.    Dessa Phi, DO [facility] [email or web address] Password [facility] 09/29/2017, 5:48 PM

## 2017-09-29 NOTE — Plan of Care (Signed)
  Problem: Education: Goal: Knowledge of General Education information will improve Outcome: Progressing   Problem: Health Behavior/Discharge Planning: Goal: Ability to manage health-related needs will improve Outcome: Progressing   Problem: Clinical Measurements: Goal: Ability to maintain clinical measurements within normal limits will improve Outcome: Progressing   Problem: Nutrition: Goal: Adequate nutrition will be maintained Outcome: Progressing   Problem: Elimination: Goal: Will not experience complications related to urinary retention Outcome: Progressing   Problem: Pain Managment: Goal: General experience of comfort will improve Outcome: Progressing   Problem: Safety: Goal: Ability to remain free from injury will improve Outcome: Progressing   Problem: Skin Integrity: Goal: Risk for impaired skin integrity will decrease Outcome: Progressing

## 2017-09-29 NOTE — Addendum Note (Signed)
Addended by: Girtha Rm on: 09/29/2017 01:14 PM   Modules accepted: Orders

## 2017-09-30 DIAGNOSIS — Z7951 Long term (current) use of inhaled steroids: Secondary | ICD-10-CM | POA: Diagnosis not present

## 2017-09-30 DIAGNOSIS — M19041 Primary osteoarthritis, right hand: Secondary | ICD-10-CM | POA: Diagnosis present

## 2017-09-30 DIAGNOSIS — J9601 Acute respiratory failure with hypoxia: Secondary | ICD-10-CM

## 2017-09-30 DIAGNOSIS — Z8551 Personal history of malignant neoplasm of bladder: Secondary | ICD-10-CM | POA: Diagnosis not present

## 2017-09-30 DIAGNOSIS — Z885 Allergy status to narcotic agent status: Secondary | ICD-10-CM | POA: Diagnosis not present

## 2017-09-30 DIAGNOSIS — Z6841 Body Mass Index (BMI) 40.0 and over, adult: Secondary | ICD-10-CM | POA: Diagnosis not present

## 2017-09-30 DIAGNOSIS — Z90710 Acquired absence of both cervix and uterus: Secondary | ICD-10-CM | POA: Diagnosis not present

## 2017-09-30 DIAGNOSIS — M19042 Primary osteoarthritis, left hand: Secondary | ICD-10-CM | POA: Diagnosis present

## 2017-09-30 DIAGNOSIS — Z7984 Long term (current) use of oral hypoglycemic drugs: Secondary | ICD-10-CM | POA: Diagnosis not present

## 2017-09-30 DIAGNOSIS — I110 Hypertensive heart disease with heart failure: Secondary | ICD-10-CM | POA: Diagnosis present

## 2017-09-30 DIAGNOSIS — Z881 Allergy status to other antibiotic agents status: Secondary | ICD-10-CM | POA: Diagnosis not present

## 2017-09-30 DIAGNOSIS — J449 Chronic obstructive pulmonary disease, unspecified: Secondary | ICD-10-CM | POA: Diagnosis present

## 2017-09-30 DIAGNOSIS — J9621 Acute and chronic respiratory failure with hypoxia: Secondary | ICD-10-CM | POA: Diagnosis present

## 2017-09-30 DIAGNOSIS — Z91040 Latex allergy status: Secondary | ICD-10-CM | POA: Diagnosis not present

## 2017-09-30 DIAGNOSIS — M8580 Other specified disorders of bone density and structure, unspecified site: Secondary | ICD-10-CM | POA: Diagnosis present

## 2017-09-30 DIAGNOSIS — Z87891 Personal history of nicotine dependence: Secondary | ICD-10-CM | POA: Diagnosis not present

## 2017-09-30 DIAGNOSIS — Z91041 Radiographic dye allergy status: Secondary | ICD-10-CM | POA: Diagnosis not present

## 2017-09-30 DIAGNOSIS — E119 Type 2 diabetes mellitus without complications: Secondary | ICD-10-CM | POA: Diagnosis present

## 2017-09-30 DIAGNOSIS — R0602 Shortness of breath: Secondary | ICD-10-CM | POA: Diagnosis present

## 2017-09-30 DIAGNOSIS — Z888 Allergy status to other drugs, medicaments and biological substances status: Secondary | ICD-10-CM | POA: Diagnosis not present

## 2017-09-30 DIAGNOSIS — J441 Chronic obstructive pulmonary disease with (acute) exacerbation: Secondary | ICD-10-CM | POA: Diagnosis present

## 2017-09-30 DIAGNOSIS — Z79899 Other long term (current) drug therapy: Secondary | ICD-10-CM | POA: Diagnosis not present

## 2017-09-30 DIAGNOSIS — Z7982 Long term (current) use of aspirin: Secondary | ICD-10-CM | POA: Diagnosis not present

## 2017-09-30 DIAGNOSIS — Z882 Allergy status to sulfonamides status: Secondary | ICD-10-CM | POA: Diagnosis not present

## 2017-09-30 DIAGNOSIS — I350 Nonrheumatic aortic (valve) stenosis: Secondary | ICD-10-CM | POA: Diagnosis present

## 2017-09-30 DIAGNOSIS — I5030 Unspecified diastolic (congestive) heart failure: Secondary | ICD-10-CM | POA: Diagnosis present

## 2017-09-30 LAB — BASIC METABOLIC PANEL
Anion gap: 10 (ref 5–15)
BUN: 16 mg/dL (ref 6–20)
CO2: 24 mmol/L (ref 22–32)
Calcium: 9.5 mg/dL (ref 8.9–10.3)
Chloride: 103 mmol/L (ref 101–111)
Creatinine, Ser: 0.59 mg/dL (ref 0.44–1.00)
GFR calc Af Amer: >60 mL/min (ref >60)
GFR calc non Af Amer: >60 mL/min (ref >60)
Glucose, Bld: 258 mg/dL — ABNORMAL HIGH (ref 65–99)
Potassium: 4.5 mmol/L (ref 3.5–5.1)
Sodium: 137 mmol/L (ref 135–145)

## 2017-09-30 LAB — CBC
HCT: 40.1 % (ref 36.0–46.0)
Hemoglobin: 13 g/dL (ref 12.0–15.0)
MCH: 28.3 pg (ref 26.0–34.0)
MCHC: 32.4 g/dL (ref 30.0–36.0)
MCV: 87.4 fL (ref 78.0–100.0)
Platelets: 133 10*3/uL — ABNORMAL LOW (ref 150–400)
RBC: 4.59 MIL/uL (ref 3.87–5.11)
RDW: 16.1 % — ABNORMAL HIGH (ref 11.5–15.5)
WBC: 3.8 10*3/uL — ABNORMAL LOW (ref 4.0–10.5)

## 2017-09-30 LAB — GLUCOSE, CAPILLARY
Glucose-Capillary: 213 mg/dL — ABNORMAL HIGH (ref 65–99)
Glucose-Capillary: 221 mg/dL — ABNORMAL HIGH (ref 65–99)
Glucose-Capillary: 279 mg/dL — ABNORMAL HIGH (ref 65–99)
Glucose-Capillary: 252 mg/dL — ABNORMAL HIGH (ref 65–99)

## 2017-09-30 MED ORDER — UMECLIDINIUM BROMIDE 62.5 MCG/INH IN AEPB
1.0000 | INHALATION_SPRAY | Freq: Every day | RESPIRATORY_TRACT | Status: DC
  Filled 2017-09-30: qty 7

## 2017-09-30 MED ORDER — FLUTICASONE FUROATE-VILANTEROL 100-25 MCG/INH IN AEPB
1.0000 | INHALATION_SPRAY | Freq: Every day | RESPIRATORY_TRACT | Status: DC
  Filled 2017-09-30: qty 28

## 2017-09-30 MED ORDER — FLUTICASONE-UMECLIDIN-VILANT 100-62.5-25 MCG/INH IN AEPB
1.0000 | INHALATION_SPRAY | Freq: Every day | RESPIRATORY_TRACT | Status: DC
  Administered 2017-09-30 – 2017-10-01 (×2): 1 via RESPIRATORY_TRACT

## 2017-09-30 MED ORDER — BISOPROLOL FUMARATE 5 MG PO TABS
10.0000 mg | ORAL_TABLET | Freq: Every day | ORAL | Status: DC
  Administered 2017-10-01: 10 mg via ORAL
  Filled 2017-09-30: qty 2

## 2017-09-30 MED ORDER — FUROSEMIDE 20 MG PO TABS
20.0000 mg | ORAL_TABLET | Freq: Every day | ORAL | Status: DC
  Administered 2017-09-30 – 2017-10-01 (×2): 20 mg via ORAL
  Filled 2017-09-30 (×2): qty 1

## 2017-09-30 NOTE — Progress Notes (Signed)
Attempted to wean pt off of 2L O2. See flowsheet data. Pt placed back on 1L O2 d/t O2 sats 88% on RA. Pt in no acute distress. Will continue to monitor and attempt to wean again in the morning.

## 2017-09-30 NOTE — Progress Notes (Signed)
Patient ambulated in hallway approximately 360 feet, tolerated fair. O2 Sats dropped to 87% RA. 2L/[facility] applied and O2 Sats improved to 92-92%. Pt became SOB and lightheaded 3/4 way through walk- O2 Sats dropped to 88% 2L/[place]. Oxygen was increased to 3L and pt was able to ambulate back to her room after a short break.

## 2017-09-30 NOTE — Progress Notes (Addendum)
PROGRESS NOTE  Shannon Schultz JEH:[id number] DOB: 05-08-1946 DOA: 09/29/2017 PCP: [name], MD  HPI/Recap of past 24 hours:  Feeling better, denies pain Reports some lower extremity edema  Assessment/Plan: Principal Problem:   Acute hypoxemic respiratory failure ([place]) Active Problems:   Essential hypertension, benign   COPD with acute exacerbation ([place])   Type 2 diabetes mellitus ([place])   Morbid obesity ([place])  Acute on chronic respiratory failure with hypoxia -From COPD exacerbation -Report has home O2 as needed at baseline -sHe is sent from [place] office to [place] due to hyposix, nonproductive cough, tmax 100.6 -presented with severe wheezing hypoxia O2 sat dropped to 86 on room air -Chest x-ray on presentation no acute findings   Diastolic CHF -She has some mild bilateral lower extremity edema -Reported take Lasix 20 mg at home as needed -Start Lasix oral 20 mg daily here  Aortic stenosis, mild to moderate, followed by cardiology Denies syncope, no chest pain,  Non-insulin-dependent type 2 diabetes A1c 6.8 On sliding scale while on steroid  Personally reviewed telemetry strips, has been in sinus rhythm since admission.  DC telemetry.  Code Status: full  Family Communication: patient   Disposition Plan: home, likely tomorrow Will need to ambulate    Consultants:  none  Procedures:  none  Antibiotics:  Oral zithromax   Objective: BP (!) 144/62 (BP Location: Left Arm)   Pulse 74   Temp 97.7 F (36.5 C) (Oral)   Resp 16   Ht 5\' 4"  (1.626 m)   Wt 108 kg (238 lb 1.5 oz)   LMP  (LMP Unknown)   SpO2 94%   BMI 40.87 kg/m   Intake/Output Summary (Last 24 hours) at 09/30/2017 1436 Last data filed at 09/30/2017 0600 Gross per 24 hour  Intake 480 ml  Output -  Net 480 ml   Filed Weights   09/29/17 1814  Weight: 108 kg (238 lb 1.5 oz)    Exam: Patient is examined daily including today on 09/30/2017, exams remain the same as of yesterday except that has  changed    General:  NAD  Cardiovascular: RRR, systolic 2/6 murmur   Respiratory: mild to moderate bilateral wheezing  Abdomen: Soft/ND/NT, positive BS  Musculoskeletal: trace bilateral pitting lower extremity Edema  Neuro: alert, oriented   Data Reviewed: Basic Metabolic Panel: Recent Labs  Lab 09/29/17 1403 09/30/17 0442  NA 137 137  K 3.6 4.5  CL 101 103  CO2 26 24  GLUCOSE 154* 258*  BUN 13 16  CREATININE 0.69 0.59  CALCIUM 9.7 9.5   Liver Function Tests: Recent Labs  Lab 09/29/17 1403  AST 37  ALT 27  ALKPHOS 74  BILITOT 0.9  PROT 7.6  ALBUMIN 4.1   No results for input(s): LIPASE, AMYLASE in the last 168 hours. No results for input(s): AMMONIA in the last 168 hours. CBC: Recent Labs  Lab 09/29/17 1403 09/30/17 0442  WBC 5.9 3.8*  NEUTROABS 4.4  --   HGB 13.4 13.0  HCT 40.7 40.1  MCV 87.7 87.4  PLT 163 133*   Cardiac Enzymes:   No results for input(s): CKTOTAL, CKMB, CKMBINDEX, TROPONINI in the last 168 hours. BNP (last 3 results) Recent Labs    09/29/17 1404  BNP 75.1    ProBNP (last 3 results) No results for input(s): PROBNP in the last 8760 hours.  CBG: Recent Labs  Lab 09/29/17 1813 09/29/17 2158 09/30/17 0741 09/30/17 1216  GLUCAP 258* 291* 213* 221*    No  results found for this or any previous visit (from the past 240 hour(s)).   Studies: Dg Chest 2 View  Result Date: 09/29/2017 CLINICAL DATA:  Cough over the last 4 days.  Shortness of breath. EXAM: CHEST - 2 VIEW COMPARISON:  04/22/2017 FINDINGS: Heart size is normal. Chronic aortic atherosclerosis. Chronic interstitial lung markings but no sign of active infiltrate, mass, effusion or collapse. Previous chest wall surgery on the right. IMPRESSION: No active cardiopulmonary disease. Electronically Signed   By: Nelson Chimes M.D.   On: 09/29/2017 15:03    Scheduled Meds: . aspirin EC  81 mg Oral Daily  . azithromycin  500 mg Oral Daily  . bisoprolol-hydrochlorothiazide  1  tablet Oral Daily  . enoxaparin (LOVENOX) injection  40 mg Subcutaneous Q24H  . Fluticasone-Umeclidin-Vilant  1 puff Inhalation Daily  . furosemide  20 mg Oral Daily  . insulin aspart  0-9 Units Subcutaneous TID WC  . ipratropium-albuterol  3 mL Nebulization TID  . methylPREDNISolone (SOLU-MEDROL) injection  60 mg Intravenous Q12H  . simvastatin  20 mg Oral QHS    Continuous Infusions:   Time spent: 30mins I have personally reviewed and interpreted on  09/30/2017 daily labs, tele strips, imagings as discussed above under date review session and assessment and plans.  I reviewed all nursing notes,  vitals, pertinent old records  I have discussed plan of care as described above with RN , patient on 09/30/2017   Florencia Reasons MD, PhD  [facility] Pager [phone]. If 7PM-7AM, please contact night-coverage at [email or web address], password [facility] 09/30/2017, 2:36 PM  LOS: 0 days

## 2017-09-30 NOTE — Progress Notes (Signed)
Pt weaned to room air. O2 sats 92%. Will continue to monitor.

## 2017-10-01 ENCOUNTER — Other Ambulatory Visit: Payer: Self-pay | Admitting: Family Medicine

## 2017-10-01 DIAGNOSIS — Z1231 Encounter for screening mammogram for malignant neoplasm of breast: Secondary | ICD-10-CM

## 2017-10-01 LAB — BASIC METABOLIC PANEL
Anion gap: 9 (ref 5–15)
BUN: 24 mg/dL — ABNORMAL HIGH (ref 6–20)
CO2: 26 mmol/L (ref 22–32)
Calcium: 9.6 mg/dL (ref 8.9–10.3)
Chloride: 104 mmol/L (ref 101–111)
Creatinine, Ser: 0.72 mg/dL (ref 0.44–1.00)
GFR calc Af Amer: >60 mL/min (ref >60)
GFR calc non Af Amer: >60 mL/min (ref >60)
Glucose, Bld: 278 mg/dL — ABNORMAL HIGH (ref 65–99)
Potassium: 4.4 mmol/L (ref 3.5–5.1)
Sodium: 139 mmol/L (ref 135–145)

## 2017-10-01 LAB — CBC WITH DIFFERENTIAL/PLATELET
Basophils Absolute: 0 10*3/uL (ref 0.0–0.1)
Basophils Relative: 0 %
Eosinophils Absolute: 0 10*3/uL (ref 0.0–0.7)
Eosinophils Relative: 0 %
HCT: 41 % (ref 36.0–46.0)
Hemoglobin: 13 g/dL (ref 12.0–15.0)
Lymphocytes Relative: 14 %
Lymphs Abs: 0.7 10*3/uL (ref 0.7–4.0)
MCH: 28.3 pg (ref 26.0–34.0)
MCHC: 31.7 g/dL (ref 30.0–36.0)
MCV: 89.1 fL (ref 78.0–100.0)
Monocytes Absolute: 0.1 10*3/uL (ref 0.1–1.0)
Monocytes Relative: 2 %
Neutro Abs: 4.3 10*3/uL (ref 1.7–7.7)
Neutrophils Relative %: 84 %
Platelets: 136 10*3/uL — ABNORMAL LOW (ref 150–400)
RBC: 4.6 MIL/uL (ref 3.87–5.11)
RDW: 16.2 % — ABNORMAL HIGH (ref 11.5–15.5)
WBC: 5.1 10*3/uL (ref 4.0–10.5)

## 2017-10-01 LAB — GLUCOSE, CAPILLARY
Glucose-Capillary: 195 mg/dL — ABNORMAL HIGH (ref 65–99)
Glucose-Capillary: 280 mg/dL — ABNORMAL HIGH (ref 65–99)

## 2017-10-01 LAB — MAGNESIUM: Magnesium: 2.2 mg/dL (ref 1.7–2.4)

## 2017-10-01 MED ORDER — PREDNISONE 10 MG PO TABS: ORAL_TABLET | ORAL | 0 refills | Status: DC

## 2017-10-01 MED ORDER — AZITHROMYCIN 250 MG PO TABS: 250.0000 mg | ORAL_TABLET | Freq: Every day | ORAL | 0 refills | Status: AC

## 2017-10-01 NOTE — Progress Notes (Signed)
Patient given discharge, follow up, and medication instructions, verbalized understanding, IV removed, prescription and personal belongings with patient, family to transport home

## 2017-10-01 NOTE — Discharge Summary (Signed)
Discharge Summary  Shannon Schultz UVO:[id number] DOB: 11-09-1945  PCP: [name], MD  Admit date: 09/29/2017 Discharge date: 10/01/2017  Time spent: <65mins  Recommendations for Outpatient Follow-up:  1. F/u with PMD within a week  for hospital discharge follow up, repeat cbc/bmp at follow up  Discharge Diagnoses:  Active Hospital Problems   Diagnosis Date Noted  . Acute hypoxemic respiratory failure ([place]) 09/29/2017  . COPD exacerbation ([place]) 09/30/2017  . Morbid obesity ([place]) 11/10/2015  . Type 2 diabetes mellitus ([place]) 08/17/2015  . COPD with acute exacerbation ([place]) 08/16/2015  . Essential hypertension, benign 11/13/2010    Resolved Hospital Problems   Diagnosis Date Noted Date Resolved  . COPD exacerbation ([place]) 09/29/2017 09/29/2017    Discharge Condition: stable  Diet recommendation: heart healthy/carb modified  Filed Weights   09/29/17 1814  Weight: 108 kg (238 lb 1.5 oz)    History of present illness: (per admitting MD Dr Maylene Roes)  PCP: [name], MD  Patient coming from: PCP office   Chief Complaint: Shortness of breath   HPI: Shannon Schultz is a 72 y.o. female with medical history significant of essential hypertension, diabetes, hyperlipidemia, COPD who presents with shortness of breath, dry cough since Saturday.  She was seen by her primary care physician's office this afternoon; they gave 2 breathing treatments in office with minimal improvement.  She was noted to be hypoxic with pulse ox 89% on room air.  Patient was referred to the emergency department. She does not wear oxygen at home.  She denies any fevers at home, chills, chest pain, nausea, vomiting, diarrhea, abdominal pain.  ED Course: CXR negative for infiltrate. Patient required [place] O2 to maintain sats. Given solumedrol and duonebs.    Hospital Course:  Principal Problem:   Acute hypoxemic respiratory failure (HCC) Active Problems:   Essential hypertension, benign   COPD with acute  exacerbation (HCC)   Type 2 diabetes mellitus ([place])   Morbid obesity (HCC)   COPD exacerbation (HCC)   Acute on chronic respiratory failure with hypoxia -From COPD exacerbation -Report has home O2 as needed at baseline -sHe is sent from [facility] office to ED due to hyposia, nonproductive cough, tmax 100.6 -presented with severe wheezing/ hypoxia ,O2 sat dropped to 86 on room air -Chest x-ray on presentation no acute findings -she received iv steroids/nebs/zithromax -wheezing resolved, she is discharged on prednisone taper and zithromax to finish abx treatment -she is to follow up with pmd for hospital discharge follow up.   Diastolic CHF -She has some mild bilateral lower extremity edema -Reported take Lasix 20 mg at home as needed -she Lasix oral 20 mg daily x 2 days here -no edema at discharge  Aortic stenosis, mild to moderate, followed by cardiology Denies syncope, no chest pain,  Non-insulin-dependent type 2 diabetes A1c 6.8 On sliding scale while on steroid resume metformin at discharge   Morbid obesity: Body mass index is 40.87 kg/m.  Life style modification  Code Status: full  Family Communication: patient   Disposition Plan: home, continue home o2 prn    Consultants:  none  Procedures:  none  Antibiotics:  Oral zithromax   Discharge Exam: BP (!) 146/70 (BP Location: Left Arm)   Pulse 65   Temp 97.6 F (36.4 C) (Oral)   Resp 16   Ht 5\' 4"  (1.626 m)   Wt 108 kg (238 lb 1.5 oz)   LMP  (LMP Unknown)   SpO2 (!) 89%   BMI 40.87 kg/m  General: * Cardiovascular: * Respiratory: *  Discharge Instructions You were cared for by a hospitalist during your hospital stay. If you have any questions about your discharge medications or the care you received while you were in the hospital after you are discharged, you can call the unit and asked to speak with the hospitalist on call if the hospitalist that took care of you is not available.  Once you are discharged, your primary care physician will handle any further medical issues. Please note that NO REFILLS for any discharge medications will be authorized once you are discharged, as it is imperative that you return to your primary care physician (or establish a relationship with a primary care physician if you do not have one) for your aftercare needs so that they can reassess your need for medications and monitor your lab values.  Discharge Instructions    Diet - low sodium heart healthy   Complete by:  As directed    Carb modified   Increase activity slowly   Complete by:  As directed      Allergies as of 10/01/2017      Reactions   Contrast Media [iodinated Diagnostic Agents] Anaphylaxis, Shortness Of Breath, Other (See Comments)   Could not breath   Iohexol Anaphylaxis, Shortness Of Breath, Other (See Comments)   Immediately could not breathe   Lisinopril Anaphylaxis, Shortness Of Breath, Rash   Sulfa Antibiotics Anaphylaxis, Other (See Comments)   Historical from mother, pt states that mother says she almost died from this drug   Latex Other (See Comments)   Unless against on skin for a long time, blisters. Short term is okay.   Codeine Nausea Only, Anxiety, Other (See Comments)   insomnia   Levofloxacin Other (See Comments)   insomnia   Lipitor [atorvastatin Calcium] Rash   Vicodin [hydrocodone-acetaminophen] Itching      Medication List    STOP taking these medications   furosemide 20 MG tablet Commonly known as:  LASIX     TAKE these medications   acetaminophen 500 MG tablet Commonly known as:  TYLENOL Take 1,000 mg every 6 (six) hours as needed by mouth for moderate pain or headache.   albuterol 108 (90 Base) MCG/ACT inhaler Commonly known as:  PROVENTIL HFA;VENTOLIN HFA Inhale 2 puffs every 6 (six) hours as needed into the lungs for wheezing or shortness of breath.   albuterol (2.5 MG/3ML) 0.083% nebulizer solution Commonly known as:   PROVENTIL USE 1 VIAL BY NEBULIZATION EVERY FOUR HOURS AS NEEDED FOR WHEEZING OR SHORTNESS OF BREATH.   aspirin EC 81 MG tablet Take 81 mg daily by mouth.   azithromycin 250 MG tablet Commonly known as:  ZITHROMAX Take 1 tablet (250 mg total) by mouth daily for 2 days.   bisoprolol-hydrochlorothiazide 10-6.25 MG tablet Commonly known as:  ZIAC TAKE 1 TABLET EVERY DAY   CALCIUM 600+D 600-400 MG-UNIT tablet Generic drug:  Calcium Carbonate-Vitamin D Take 1 tablet by mouth daily.   CENTRUM SILVER PO Take 1 tablet by mouth daily.   EQL VITAMIN D3 2000 units Caps Generic drug:  Cholecalciferol Take 4,000 Units daily by mouth.   fluticasone 50 MCG/ACT nasal spray Commonly known as:  FLONASE USE 2 SPRAYS IN EACH NOSTRIL EVERY DAY   KLS ALLER-TEC 10 MG tablet Generic drug:  cetirizine Take 10 mg by mouth daily.   metFORMIN 500 MG 24 hr tablet Commonly known as:  GLUCOPHAGE-XR TAKE 1 TABLET THREE TIMES DAILY   predniSONE 10 MG tablet  Commonly known as:  DELTASONE Label  & dispense according to the schedule below. 6 Pills PO on day one then, 5 Pills PO on day two, 4 Pills PO on day three, 3Pills PO on day four, 2 Pills PO on day five, 1 Pills PO on day six,  then STOP.  Total of 21 tabs   simvastatin 20 MG tablet Commonly known as:  ZOCOR TAKE 1 TABLET AT BEDTIME   TRELEGY ELLIPTA 100-62.5-25 MCG/INH Aepb Generic drug:  Fluticasone-Umeclidin-Vilant INHALE 1 PUFF INTO THE LUNGS EVERY DAY   triamcinolone cream 0.1 % Commonly known as:  KENALOG Apply 1 application 2 (two) times daily as needed topically (for dark red areas on leg).   TRUEPLUS LANCETS 33G Misc TEST ONE TIME DAILY   valACYclovir 500 MG tablet Commonly known as:  VALTREX TAKE 1 TABLET BY MOUTH EVERY DAY      Allergies  Allergen Reactions  . Contrast Media [Iodinated Diagnostic Agents] Anaphylaxis, Shortness Of Breath and Other (See Comments)    Could not breath  . Iohexol Anaphylaxis, Shortness Of  Breath and Other (See Comments)    Immediately could not breathe  . Lisinopril Anaphylaxis, Shortness Of Breath and Rash  . Sulfa Antibiotics Anaphylaxis and Other (See Comments)    Historical from mother, pt states that mother says she almost died from this drug  . Latex Other (See Comments)    Unless against on skin for a long time, blisters. Short term is okay.  . Codeine Nausea Only, Anxiety and Other (See Comments)    insomnia  . Levofloxacin Other (See Comments)    insomnia  . Lipitor [Atorvastatin Calcium] Rash  . Vicodin [Hydrocodone-Acetaminophen] Itching   Follow-up Information    [name], MD Follow up in 1 week(s).   Specialty:  Family Medicine Why:  hospital discharge follow up Contact information: [place] [id number] [phone]        Jerline Pain, MD .   Specialty:  Cardiology Contact information: [address] [place] [address] [phone]            The results of significant diagnostics from this hospitalization (including imaging, microbiology, ancillary and laboratory) are listed below for reference.    Significant Diagnostic Studies: Dg Chest 2 View  Result Date: 09/29/2017 CLINICAL DATA:  Cough over the last 4 days.  Shortness of breath. EXAM: CHEST - 2 VIEW COMPARISON:  04/22/2017 FINDINGS: Heart size is normal. Chronic aortic atherosclerosis. Chronic interstitial lung markings but no sign of active infiltrate, mass, effusion or collapse. Previous chest wall surgery on the right. IMPRESSION: No active cardiopulmonary disease. Electronically Signed   By: Nelson Chimes M.D.   On: 09/29/2017 15:03   Ct Chest Lung Ca Screen Low Dose W/o Cm  Result Date: 09/16/2017 CLINICAL DATA:  72 year old female with 45 pack year history of smoking. Lung cancer screening. EXAM: CT CHEST WITHOUT CONTRAST LOW-DOSE FOR LUNG CANCER SCREENING TECHNIQUE: Multidetector CT imaging of the chest was performed following the  standard protocol without IV contrast. COMPARISON:  09/10/2016 FINDINGS: Cardiovascular: Heart size normal. Coronary artery calcification is evident. Atherosclerotic calcification is noted in the wall of the thoracic aorta. Mediastinum/Nodes: No mediastinal lymphadenopathy. No evidence for gross hilar lymphadenopathy although assessment is limited by the lack of intravenous contrast on today's study. The esophagus has normal imaging features. There is no axillary lymphadenopathy. Surgical clips in the right axilla suggest prior lymphadenectomy. Lungs/Pleura: Centrilobular emphysema noted. Scattered tiny bilateral pulmonary nodules  are evident measuring up to maximum volume derived equivalent diameter of 3.9 mm. No overtly suspicious pulmonary nodule or mass. Similar appearance of scarring in the anterior right lung. Upper Abdomen: Diffuse fatty deposition noted in the liver parenchyma with asymmetric enlargement of the lateral segment left liver and peripheral nodular contour, features compatible with cirrhosis. Musculoskeletal: Bone windows reveal no worrisome lytic or sclerotic osseous lesions. Patient is status post right mastectomy. IMPRESSION: 1. Lung-RADS 2, benign appearance or behavior. Continue annual screening with low-dose chest CT without contrast in 12 months. 2.  Emphysema. (ICD10-J43.9) 3.  Aortic Atherosclerois (ICD10-170.0) 4. Changes in the liver compatible with cirrhosis. Electronically Signed   By: Misty [facility] M.D.   On: 09/16/2017 14:44    Microbiology: No results found for this or any previous visit (from the past 240 hour(s)).   Labs: Basic Metabolic Panel: Recent Labs  Lab 09/29/17 1403 09/30/17 0442 10/01/17 0424  NA 137 137 139  K 3.6 4.5 4.4  CL 101 103 104  CO2 26 24 26   GLUCOSE 154* 258* 278*  BUN 13 16 24*  CREATININE 0.69 0.59 0.72  CALCIUM 9.7 9.5 9.6  MG  --   --  2.2   Liver Function Tests: Recent Labs  Lab 09/29/17 1403  AST 37  ALT 27  ALKPHOS 74   BILITOT 0.9  PROT 7.6  ALBUMIN 4.1   No results for input(s): LIPASE, AMYLASE in the last 168 hours. No results for input(s): AMMONIA in the last 168 hours. CBC: Recent Labs  Lab 09/29/17 1403 09/30/17 0442 10/01/17 0424  WBC 5.9 3.8* 5.1  NEUTROABS 4.4  --  4.3  HGB 13.4 13.0 13.0  HCT 40.7 40.1 41.0  MCV 87.7 87.4 89.1  PLT 163 133* 136*   Cardiac Enzymes: No results for input(s): CKTOTAL, CKMB, CKMBINDEX, TROPONINI in the last 168 hours. BNP: BNP (last 3 results) Recent Labs    09/29/17 1404  BNP 75.1    ProBNP (last 3 results) No results for input(s): PROBNP in the last 8760 hours.  CBG: Recent Labs  Lab 09/30/17 0741 09/30/17 1216 09/30/17 1642 09/30/17 2037 10/01/17 0739  GLUCAP 213* 221* 279* 252* 195*       Signed:  Florencia Reasons MD, PhD  [facility] 10/01/2017, 11:27 AM

## 2017-10-02 ENCOUNTER — Telehealth: Payer: Self-pay | Admitting: Family Medicine

## 2017-10-02 NOTE — Telephone Encounter (Signed)
Pt was called concerning recent hospital stay. Pt states that she is feeling much better. Current and discharge medications were reconciled. Pt was placed on zithromax and prednisone. She has had those medications filled and has started taking. Lasix was D/C and she has understanding of that. All other medications stayed the same. She does not need refills on any medications at this time. Pt was informed to bring all medications with her to her office visit. Pt states she will. Pt was reminded of appt date and time for Hospital follow up appt. She verified understanding. After hour and week end protocol was discussed with pt and she verified understanding.

## 2017-10-03 DIAGNOSIS — J449 Chronic obstructive pulmonary disease, unspecified: Secondary | ICD-10-CM | POA: Diagnosis not present

## 2017-10-03 DIAGNOSIS — J441 Chronic obstructive pulmonary disease with (acute) exacerbation: Secondary | ICD-10-CM | POA: Diagnosis not present

## 2017-10-06 NOTE — Progress Notes (Signed)
Chief Complaint  Patient presents with  . Hospitalization Follow-up    COPD follow up. Wants to know what "stage" her COPD diagnosis is, if you know this. Feels worse today then yesterday. Still coughing a lot. Patient brought in list of blood sugars since she was on prednisone.    Transition of care visit.  Patient presents for hospital follow-up.  She was hospitalized 4/2-10/01/17 with COPD exacerbation with hypoxia.  Chest x-ray was normal.  While in the hospital she received IV steroids/nebs/zithromax.  She was discharged on prednisone taper and completed course of zithromax. She was treated with 2 days of lasix while in the hospital, and her edema resolved. She notes just slight swelling today.  She is a diabetic, and just completed course of steroids. She took her last pill yesterday. Blood sugars have been running around 195-200 when on the prednisone, 109 this morning.  Breathing is okay at rest, still dropping her O2 sats with exertion (88% after walking back and forth to her car yesterday). At rest her sats are good.  She reports that she is still wheezing some, and she is coughing if she tries to take a deep breath. This is worse today than yesterday, started late yesterday.  Cough is productive of creamy/light yellow phlegm, not too thick.  She has been using her nebulizer twice daily since home. She doesn't have f/u scheduled with pulmonary, reports that her next routine check is due in the summer--chart checked, nothing is actually scheduled yet.   Tessalon wasn't effective.  Tussin usually works better, needs to get more from the pharmacy.  She has mucinex at home, and knows not to take with the Tussin DM.  Grandkids had been sick, staying with her.  She got sick the following week. Also has allergies, flaring some with runny nose, PND, ears plugging.  Lab Results  Component Value Date   WBC 5.1 10/01/2017   HGB 13.0 10/01/2017   HCT 41.0 10/01/2017   MCV 89.1 10/01/2017   PLT  136 (L) 10/01/2017     Chemistry      Component Value Date/Time   NA 139 10/01/2017 0424   NA 141 09/14/2017 0846   K 4.4 10/01/2017 0424   CL 104 10/01/2017 0424   CO2 26 10/01/2017 0424   BUN 24 (H) 10/01/2017 0424   BUN 17 09/14/2017 0846   CREATININE 0.72 10/01/2017 0424   CREATININE 0.75 03/05/2017 0736      Component Value Date/Time   CALCIUM 9.6 10/01/2017 0424   ALKPHOS 74 09/29/2017 1403   AST 37 09/29/2017 1403   ALT 27 09/29/2017 1403   BILITOT 0.9 09/29/2017 1403   BILITOT 0.6 09/14/2017 0846     PMH, PSH, SH reviewed  Outpatient Encounter Medications as of 10/08/2017  Medication Sig Note  . albuterol (PROVENTIL) (2.5 MG/3ML) 0.083% nebulizer solution USE 1 VIAL BY NEBULIZATION EVERY FOUR HOURS AS NEEDED FOR WHEEZING OR SHORTNESS OF BREATH.   Marland Kitchen aspirin EC 81 MG tablet Take 81 mg daily by mouth.   . bisoprolol-hydrochlorothiazide (ZIAC) 10-6.25 MG tablet TAKE 1 TABLET EVERY DAY   . Calcium Carbonate-Vitamin D (CALCIUM 600+D) 600-400 MG-UNIT per tablet Take 1 tablet by mouth daily.    . cetirizine (KLS ALLER-TEC) 10 MG tablet Take 10 mg by mouth daily.   . Cholecalciferol (EQL VITAMIN D3) 2000 units CAPS Take 4,000 Units daily by mouth.   . fluticasone (FLONASE) 50 MCG/ACT nasal spray USE 2 SPRAYS IN EACH NOSTRIL EVERY DAY   .  metFORMIN (GLUCOPHAGE-XR) 500 MG 24 hr tablet TAKE 1 TABLET THREE TIMES DAILY   . Multiple Vitamins-Minerals (CENTRUM SILVER PO) Take 1 tablet by mouth daily.    . simvastatin (ZOCOR) 20 MG tablet TAKE 1 TABLET AT BEDTIME   . TRELEGY ELLIPTA 100-62.5-25 MCG/INH AEPB INHALE 1 PUFF INTO THE LUNGS EVERY DAY   . TRUEPLUS LANCETS 33G MISC TEST ONE TIME DAILY   . valACYclovir (VALTREX) 500 MG tablet TAKE 1 TABLET BY MOUTH EVERY DAY   . [DISCONTINUED] predniSONE (DELTASONE) 10 MG tablet Label  & dispense according to the schedule below. 6 Pills PO on day one then, 5 Pills PO on day two, 4 Pills PO on day three, 3Pills PO on day four, 2 Pills PO on day  five, 1 Pills PO on day six,  then STOP.  Total of 21 tabs   . acetaminophen (TYLENOL) 500 MG tablet Take 1,000 mg every 6 (six) hours as needed by mouth for moderate pain or headache.    . albuterol (PROVENTIL HFA;VENTOLIN HFA) 108 (90 Base) MCG/ACT inhaler Inhale 2 puffs every 6 (six) hours as needed into the lungs for wheezing or shortness of breath.   . predniSONE (DELTASONE) 10 MG tablet Take 2 tablets by mouth x 2days, then 1 tablet daily for 3d, then 1/2 tablet daily   . [DISCONTINUED] predniSONE (STERAPRED UNI-PAK 21 TAB) 10 MG (21) TBPK tablet See admin instructions.   . [DISCONTINUED] triamcinolone cream (KENALOG) 0.1 % Apply 1 application 2 (two) times daily as needed topically (for dark red areas on leg). 09/29/2017: Reports not using currently unless the spots are unbearable because once she stops using, the spots come right back   No facility-administered encounter medications on file as of 10/08/2017.    Allergies  Allergen Reactions  . Contrast Media [Iodinated Diagnostic Agents] Anaphylaxis, Shortness Of Breath and Other (See Comments)    Could not breath  . Iohexol Anaphylaxis, Shortness Of Breath and Other (See Comments)    Immediately could not breathe  . Lisinopril Anaphylaxis, Shortness Of Breath and Rash  . Sulfa Antibiotics Anaphylaxis and Other (See Comments)    Historical from mother, pt states that mother says she almost died from this drug  . Latex Other (See Comments)    Unless against on skin for a long time, blisters. Short term is okay.  . Codeine Nausea Only, Anxiety and Other (See Comments)    insomnia  . Levofloxacin Other (See Comments)    insomnia  . Lipitor [Atorvastatin Calcium] Rash  . Vicodin [Hydrocodone-Acetaminophen] Itching   ROS:  No fever, chills, headaches, dizziness, chest pain.  +DOE, cough, per HPI. No nausea, vomiting, diarrhea, rash or other concerns. See HPI.   PHYSICAL EXAM:  BP 120/70   Pulse 80   Temp 98 F (36.7 C) (Tympanic)    Ht 5' 4.5" (1.638 m)   Wt 236 lb (107 kg)   LMP  (LMP Unknown)   SpO2 96%   BMI 39.88 kg/m   Well appearing, pleasant female, speaking easily in full sentences, with intermittent coughing spells (productive of creamy phlegm, not thick) HEENT: conjunctiva and sclera are clear. TM's and EAC's normal. Nasal mucosa is fairly normal. OP has some slight white drainage posteriorly, otherwise normal. Sinuses nontender Neck: No lymphadenopathy or mass Heart: regular rate and rhythm, unchanged 3/6 murmur Lungs: somewhat distant, no wheezes, rales, ronchi, fair air movement Extremities: Trace pretibial edema bilaterally. Venous stasis changes/hyperpigmentation. Skin intact, no erythema/warmth. Psych: normal mood, affect, hygiene and  grooming Neuro: alert and oriented, cranial nerves intact, normal gait   ASSESSMENT/PLAN:  COPD with hypoxia ([place]) - recent exacerbation. Improved, but worsening since stopped prednisone--give low dose for longer. f/u pulm. Cont O2 with exertion/prn - Plan: predniSONE (DELTASONE) 10 MG tablet  Seasonal allergic rhinitis, unspecified trigger  Type 2 diabetes mellitus without complication, without long-term current use of insulin (HCC) - as expected, sugars higher when on prednisone, better today. Expect to increase again, temporarily   D/c summary mentioned rechecking CBC and b-met. Not sure I see the reason behind this.  Labs in hospital were reviewed. Other than sugars being high, was okay.  She was treated with lasix, but hasn't been taking ongoing at home. plt sl low at 133, 136 (163 on arrival), Hg normal, WBC okay (one was borderline low). Not having any bruising/symptoms, do not think these need to be repeated today.   Continue your cetirizine and flonase for allergies. I think you need a somewhat longer course of prednisone in your system. Take 59m (2 pills) daily for 2 days, then 1 pill daily for 3 days, then 1/2 tablet daily for 4 days. Contact [place]  you develop fever, mucus that is getting darker, worsening symptoms.    Continue guaifenesin and use dextromethorphan for the cough as needed (mucinex DM or Tussin DM).   Forward note to pulmonary, Dr. MVaughan Browner

## 2017-10-08 ENCOUNTER — Telehealth: Payer: Self-pay

## 2017-10-08 ENCOUNTER — Encounter: Payer: Self-pay | Admitting: Family Medicine

## 2017-10-08 ENCOUNTER — Ambulatory Visit (INDEPENDENT_AMBULATORY_CARE_PROVIDER_SITE_OTHER): Payer: Medicare HMO | Admitting: Family Medicine

## 2017-10-08 VITALS — BP 120/70 | HR 80 | Temp 98.0°F | Ht 64.5 in | Wt 236.0 lb

## 2017-10-08 DIAGNOSIS — J449 Chronic obstructive pulmonary disease, unspecified: Secondary | ICD-10-CM | POA: Diagnosis not present

## 2017-10-08 DIAGNOSIS — J302 Other seasonal allergic rhinitis: Secondary | ICD-10-CM

## 2017-10-08 DIAGNOSIS — R0902 Hypoxemia: Secondary | ICD-10-CM

## 2017-10-08 DIAGNOSIS — E119 Type 2 diabetes mellitus without complications: Secondary | ICD-10-CM | POA: Diagnosis not present

## 2017-10-08 MED ORDER — PREDNISONE 10 MG PO TABS: ORAL_TABLET | ORAL | 0 refills | Status: DC

## 2017-10-08 NOTE — Telephone Encounter (Signed)
Pt has been scheduled for OV with Dr. Vaughan Browner on 10/12/17. Nothing further is needed.

## 2017-10-08 NOTE — Telephone Encounter (Signed)
-----   Message from Marshell Garfinkel, MD sent at 10/08/2017  3:04 PM EDT ----- Thanks for the update. Margie- can you make a follow up visit at next available?  ----- Message ----- From: [name], MD Sent: 10/08/2017   1:32 PM To: Marshell Garfinkel, MD  FYI--I'm not sure if you were aware of her recent hospitalization for COPD flare.

## 2017-10-08 NOTE — Patient Instructions (Addendum)

## 2017-10-12 ENCOUNTER — Ambulatory Visit: Payer: Medicare HMO | Admitting: Pulmonary Disease

## 2017-10-12 ENCOUNTER — Encounter: Payer: Self-pay | Admitting: Pulmonary Disease

## 2017-10-12 VITALS — BP 138/84 | HR 87 | Ht 64.5 in | Wt 238.8 lb

## 2017-10-12 DIAGNOSIS — J449 Chronic obstructive pulmonary disease, unspecified: Secondary | ICD-10-CM | POA: Diagnosis not present

## 2017-10-12 MED ORDER — UMECLIDINIUM-VILANTEROL 62.5-25 MCG/INH IN AEPB: 1.0000 | INHALATION_SPRAY | Freq: Every day | RESPIRATORY_TRACT | 0 refills | Status: DC

## 2017-10-12 MED ORDER — UMECLIDINIUM-VILANTEROL 62.5-25 MCG/INH IN AEPB: 1.0000 | INHALATION_SPRAY | Freq: Every day | RESPIRATORY_TRACT | 5 refills | Status: DC

## 2017-10-12 NOTE — Progress Notes (Signed)
[name]    [id number]    18-Mar-1946  Primary Care Physician:Knapp, Tera Helper, MD  Referring Physician: [name], [address] [place], [place] [address]  Chief complaint:  Follow up for COPD GOLD D  HPI: Shannon Schultz is a 72 year old with COPD, moderate obstructive defect. She was previously seen by Dr. Melvyn Novas in 2013. She has been maintained on Symbicort since 2013. She was hospitalized in February 2017 for the flu and COPD exacerbation. Switched to trelegy inhaler in 2018 with good response.   She has had an eventful year in 2018.  She lost her sister to breast cancer in early 2018, was diagnosed with breast cancer s/p lumpectomy, had bladder cancer and underwent resection and intravesicular chemo, fell and broke her right shoulder, underwent ventral hernia repair with mesh insertion.  She follows with [facility] and was told that there may be a fluid collection around the mesh and is on  Interim History: Hospitalized for 2 days in April 2019 with COPD exacerbation.  Treated with IV steroids, nebs, Zithromax. Noted to have multiple bilateral lower extremity edema and given Lasix She is currently on Lasix as needed  Followed up with her primary care Dr. Tomi Bamberger last week and given a more prolonged prednisone taper Complains of chronic dyspnea on exertion, cough with white mucus.  Denies any fevers, chills  Outpatient Encounter Medications as of 10/12/2017  Medication Sig  . acetaminophen (TYLENOL) 500 MG tablet Take 1,000 mg every 6 (six) hours as needed by mouth for moderate pain or headache.   . albuterol (PROVENTIL HFA;VENTOLIN HFA) 108 (90 Base) MCG/ACT inhaler Inhale 2 puffs every 6 (six) hours as needed into the lungs for wheezing or shortness of breath.  Marland Kitchen albuterol (PROVENTIL) (2.5 MG/3ML) 0.083% nebulizer solution USE 1 VIAL BY NEBULIZATION EVERY FOUR HOURS AS NEEDED FOR WHEEZING OR SHORTNESS OF BREATH.  Marland Kitchen aspirin EC 81 MG tablet Take 81 mg daily by mouth.  .  bisoprolol-hydrochlorothiazide (ZIAC) 10-6.25 MG tablet TAKE 1 TABLET EVERY DAY  . Calcium Carbonate-Vitamin D (CALCIUM 600+D) 600-400 MG-UNIT per tablet Take 1 tablet by mouth daily.   . cetirizine (KLS ALLER-TEC) 10 MG tablet Take 10 mg by mouth daily.  . Cholecalciferol (EQL VITAMIN D3) 2000 units CAPS Take 4,000 Units daily by mouth.  . fluticasone (FLONASE) 50 MCG/ACT nasal spray USE 2 SPRAYS IN EACH NOSTRIL EVERY DAY  . metFORMIN (GLUCOPHAGE-XR) 500 MG 24 hr tablet TAKE 1 TABLET THREE TIMES DAILY  . Multiple Vitamins-Minerals (CENTRUM SILVER PO) Take 1 tablet by mouth daily.   . predniSONE (DELTASONE) 10 MG tablet Take 2 tablets by mouth x 2days, then 1 tablet daily for 3d, then 1/2 tablet daily  . simvastatin (ZOCOR) 20 MG tablet TAKE 1 TABLET AT BEDTIME  . TRELEGY ELLIPTA 100-62.5-25 MCG/INH AEPB INHALE 1 PUFF INTO THE LUNGS EVERY DAY  . TRUEPLUS LANCETS 33G MISC TEST ONE TIME DAILY  . valACYclovir (VALTREX) 500 MG tablet TAKE 1 TABLET BY MOUTH EVERY DAY   No facility-administered encounter medications on file as of 10/12/2017.     Allergies as of 10/12/2017 - Review Complete 10/12/2017  Allergen Reaction Noted  . Contrast media [iodinated diagnostic agents] Anaphylaxis, Shortness Of Breath, and Other (See Comments) 06/06/2011  . Iohexol Anaphylaxis, Shortness Of Breath, and Other (See Comments) 06/25/2009  . Lisinopril Anaphylaxis, Shortness Of Breath, and Rash 05/15/2014  . Sulfa antibiotics Anaphylaxis and Other (See Comments) 11/11/2010  . Latex Other (See Comments)  06/25/2011  . Codeine Nausea Only, Anxiety, and Other (See Comments) 11/11/2010  . Levofloxacin Other (See Comments) 11/11/2010  . Lipitor [atorvastatin calcium] Rash 11/11/2010  . Vicodin [hydrocodone-acetaminophen] Itching 08/05/2017    Past Medical History:  Diagnosis Date  . Aortic stenosis    mild-mod by 04/08/16 echo; mod AS 04/2017  . Arthritis    "hands" (05/15/2017)  . Bladder cancer ([place]) 2018  .  Breast cancer, right ([place]) 1992   DCIS,bladder ca (just dx)  . Colon polyp   . Complication of anesthesia 1992   "local anesthesia" used was hard to awaken from-no problems since (05/15/2017)  . COPD (chronic obstructive pulmonary disease) ([place])   . Diverticulosis   . Dyspnea   . Elevated cholesterol   . Elevated liver enzymes    fatty liver per ultrasound per pt  . Environmental allergies    "I have allergies year round" (05/15/2017)  . Family history of malignant neoplasm of breast   . FHx: BRCA2 gene positive    sister with BRCA2 mutation (pt tested NEGATIVE)  . Genital HSV    gets on hip  . Heart murmur     echo 05/2011 mild aortic stenosis  . HSV (herpes simplex virus) infection    on hip--on daily suppression  . Hypertension   . Hypothyroidism    took med 7 yrs after birth of 1st child  . Impaired glucose tolerance   . Migraine   . On home oxygen therapy    "have it available but I'm not using it" (05/15/2017)  . Osteopenia   . Pneumonia ~ 1950; 08/06/2010  . Type 2 diabetes mellitus ([place])   . Ventral hernia   . Vitamin D deficiency disease     Past Surgical History:  Procedure Laterality Date  . ABDOMINAL HERNIA REPAIR  05/15/2017  . BREAST BIOPSY Right 1992  . BREAST BIOPSY Left 2018  . BREAST LUMPECTOMY WITH RADIOACTIVE SEED LOCALIZATION Left 12/22/2016   Procedure: LEFT BREAST LUMPECTOMY WITH RADIOACTIVE SEED LOCALIZATION;  Surgeon: Jovita Kussmaul, MD;  Location: [facility];  Service: General;  Laterality: Left;  . COLONOSCOPY  2009, 08/2010, 12/2015   Dr. Collene Mares; "only 1 had any polyps" (05/15/2017)  . CYSTOSCOPY  04/23/2017  . CYSTOSCOPY W/ RETROGRADES Bilateral 01/12/2017   Procedure: CYSTOSCOPY WITH RETROGRADE PYELOGRAM/ EXAM UNDER ANESTHESIA;  Surgeon: Raynelle Bring, MD;  Location: [facility];  Service: Urology;  Laterality: Bilateral;  . HERNIA REPAIR    . INSERTION OF MESH N/A 05/15/2017   Procedure: INSERTION OF MESH;  Surgeon: Jovita Kussmaul, MD;  Location: [facility];   Service: General;  Laterality: N/A;  . LAPAROSCOPIC CHOLECYSTECTOMY  2003  . MASTECTOMY Right 1992  . TRANSURETHRAL RESECTION OF BLADDER TUMOR WITH MITOMYCIN-C N/A 01/12/2017   Procedure: TRANSURETHRAL RESECTION OF BLADDER TUMOR WITH POSSIBLE POST OPERATIVE INSTILLATION OF MITOMYCIN-C;  Surgeon: Raynelle Bring, MD;  Location: [facility];  Service: Urology;  Laterality: N/A;  . TYMPANOSTOMY TUBE PLACEMENT Bilateral 1980s  . UMBILICAL HERNIA REPAIR  2003  . VENTRAL HERNIA REPAIR N/A 05/15/2017   Procedure: VENTRAL HERNIA REPAIR WITH MESH;  Surgeon: Jovita Kussmaul, MD;  [facility];  Service: General;  Laterality: N/A;    Family History  Problem Relation Age of Onset  . Heart disease Father   . Diabetes Father   . Hypertension Father   . Asthma Sister   . Allergies Sister   . Hyperlipidemia Sister   . Hashimoto's thyroiditis Sister   . Fibromyalgia Sister   .  Heart disease Mother        tachycardia  . Asthma Sister   . Allergies Sister   . Hyperlipidemia Sister   . Hashimoto's thyroiditis Sister   . Fibromyalgia Sister   . Breast cancer Sister 49       BRCA2 positive; metastatic to bones at age 78, then spread to liver  . Cancer Other        female cancers, bone cancer  . Cancer Maternal Grandmother        deceased 43; unk. primary; possibly stomach  . Tuberculosis Maternal Grandfather   . Stroke Paternal Grandmother 50       died of cerebral hemorrhage    Social History   Socioeconomic History  . Marital status: Married    Spouse name: Not on file  . Number of children: 3  . Years of education: Not on file  . Highest education level: Not on file  Occupational History  . Occupation: Retired  Scientific laboratory technician  . Financial resource strain: Not on file  . Food insecurity:    Worry: Not on file    Inability: Not on file  . Transportation needs:    Medical: Not on file    Non-medical: Not on file  Tobacco Use  . Smoking status: Former Smoker    Packs/day: 1.00     Years: 46.00    Pack years: 46.00    Types: Cigarettes    Last attempt to quit: 05/31/2011    Years since quitting: 6.3  . Smokeless tobacco: Never Used  Substance and Sexual Activity  . Alcohol use: No    Comment: 05/15/2017 "1 drink per year maybe"  . Drug use: No  . Sexual activity: Not Currently    Partners: Male    Birth control/protection: Post-menopausal  Lifestyle  . Physical activity:    Days per week: Not on file    Minutes per session: Not on file  . Stress: Not on file  Relationships  . Social connections:    Talks on phone: Not on file    Gets together: Not on file    Attends religious service: Not on file    Active member of club or organization: Not on file    Attends meetings of clubs or organizations: Not on file    Relationship status: Not on file  . Intimate partner violence:    Fear of current or ex partner: Not on file    Emotionally abused: Not on file    Physically abused: Not on file    Forced sexual activity: Not on file  Other Topics Concern  . Not on file  Social History Narrative   Lives with her husband.  Children all live in [place] nearby. No pets. 6 grandchildren.   Review of systems: Review of Systems  Constitutional: Negative for fever and chills.  HENT: Negative.   Eyes: Negative for blurred vision.  Respiratory: as per HPI  Cardiovascular: Negative for chest pain and palpitations.  Gastrointestinal: Negative for vomiting, diarrhea, blood per rectum. Genitourinary: Negative for dysuria, urgency, frequency and hematuria.  Musculoskeletal: Negative for myalgias, back pain and joint pain.  Skin: Negative for itching and rash.  Neurological: Negative for dizziness, tremors, focal weakness, seizures and loss of consciousness.  Endo/Heme/Allergies: Negative for environmental allergies.  Psychiatric/Behavioral: Negative for depression, suicidal ideas and hallucinations.  All other systems reviewed and are negative.  Physical Exam: Blood  pressure 138/84, pulse 87, height 5' 4.5" (1.638 m), weight 238 lb 12.8  oz (108.3 kg), SpO2 93 %. Gen:      No acute distress HEENT:  EOMI, sclera anicteric Neck:     No masses; no thyromegaly Lungs:    Clear to auscultation bilaterally; normal respiratory effort CV:         Regular rate and rhythm; no murmurs Abd:      + bowel sounds; soft, non-tender; no palpable masses, no distension Ext:    No edema; adequate peripheral perfusion Skin:      Warm and dry; no rash Neuro: alert and oriented x 3 Psych: normal mood and affect  Data Reviewed: Chest x-ray 08/16/15-hyperinflation consistent with COPD. No acute pulmonary abnormality. Chest x-ray 9//16/16-no acute cardio pulmonary abnormality Screening CT chest 09/10/16 -moderate emphysema Screening CT 09/16/17-moderate emphysema, scattered subcentimeter pulmonary nodules.  Fatty liver with findings of cirrhosis. Chest x-ray 09/29/17-no active cardiac pulmonary disease I reviewed the images personally.  PFTs 08/28/11 FVC 2.71 [92%], FEV1 1.33 (62%), F/F 49, TLC 114%, DLCO 46% Moderate obstructive defect with moderate reduction in diffusion capacity. No bronchodilator response  11/08/15 FVC 2.05 [7%), FEV1 1.23 (55%), F/F 60 Moderate obstructive defect  09/24/16 FVC 2. (82%), FEV1 1.43 (62%), F/F 58 , TLC 99%, DLCO 54% Moderate obstruction with moderate diffusion defect. No bronchodilator response.  A1AT 08/08/16- 162, PIMM  Assessment:  COPD GOLD D She is recovering slowly from her recent hospitalization.  Continues on prednisone taper. No wheezing noted on examination today.  Continue trelegy and albuterol nebs as needed  Continue supplemental oxygenation with ambulation, will need 2 L per walk test today A1AT levels are normal and screening CT is ok.  Finished pulmonary rehab in 2018 Continue low dose screening CT  Health maintenance  03/05/17-Influenza high-dose 07/27/10 Pneumovax 23 07/06/14-03/30/2012  Plan/Recommendations: -  Continue trelegy, supplemental O2 - Continue low dose screening CT   Marshell Garfinkel MD [facility] Pager [phone] 10/12/2017, 9:48 AM  CC: [name], MD

## 2017-10-12 NOTE — Patient Instructions (Addendum)
Continue using oxygen as your O2 levels were low on exertion today Continue using the trelegy and albuterol nebulizer Follow-up in 3 months.

## 2017-10-13 ENCOUNTER — Telehealth: Payer: Self-pay | Admitting: Pulmonary Disease

## 2017-10-13 NOTE — Telephone Encounter (Signed)
Called and spoke with patient, she states that her pharmacy called her and said that Anoro was called in for her. Patient does not take Anoro she takes Trellegy. Patient states this was a mistake. Called pharmacy and advised them that they can disregard the Anoro it was sent in by mistake. Patient states that she does not need a refill of the Trellegy she has enough for right now. Nothing further needed.

## 2017-10-27 ENCOUNTER — Ambulatory Visit
Admission: RE | Admit: 2017-10-27 | Discharge: 2017-10-27 | Disposition: A | Discharge status: Home / Self Care | Payer: Medicare HMO | Source: Ambulatory Visit | Attending: Family Medicine | Admitting: Family Medicine

## 2017-10-27 ENCOUNTER — Other Ambulatory Visit: Payer: Self-pay | Admitting: Family Medicine

## 2017-10-27 DIAGNOSIS — H25013 Cortical age-related cataract, bilateral: Secondary | ICD-10-CM | POA: Diagnosis not present

## 2017-10-27 DIAGNOSIS — H35363 Drusen (degenerative) of macula, bilateral: Secondary | ICD-10-CM | POA: Diagnosis not present

## 2017-10-27 DIAGNOSIS — H35033 Hypertensive retinopathy, bilateral: Secondary | ICD-10-CM | POA: Diagnosis not present

## 2017-10-27 DIAGNOSIS — R609 Edema, unspecified: Secondary | ICD-10-CM

## 2017-10-27 DIAGNOSIS — Z1231 Encounter for screening mammogram for malignant neoplasm of breast: Secondary | ICD-10-CM | POA: Diagnosis not present

## 2017-10-27 DIAGNOSIS — H2513 Age-related nuclear cataract, bilateral: Secondary | ICD-10-CM | POA: Diagnosis not present

## 2017-10-27 DIAGNOSIS — E119 Type 2 diabetes mellitus without complications: Secondary | ICD-10-CM | POA: Diagnosis not present

## 2017-10-27 LAB — HM DIABETES EYE EXAM

## 2017-10-27 NOTE — Telephone Encounter (Signed)
Is this okay to refill? 

## 2017-10-29 ENCOUNTER — Encounter: Payer: Self-pay | Admitting: *Deleted

## 2017-10-29 ENCOUNTER — Telehealth: Payer: Self-pay | Admitting: Cardiology

## 2017-10-29 NOTE — Telephone Encounter (Signed)
Reviewed results of stress testing below.  Pt stated she just can't remember d/t brain surgery.  Notes Recorded by Shellia Cleverly, RN on 03/26/2015 at 1:42 PM Reviewed results with pt who states understanding. ------  Notes Recorded by Jerline Pain, MD on 03/22/2015 at 5:09 PM   Nuclear stress EF: 71%.   There was no ST segment deviation noted during stress.   The study is normal. No evidence of ischemia. No evidence of infarction .   This is a low risk study.  Excellent.  Candee Furbish, MD

## 2017-10-29 NOTE — Telephone Encounter (Signed)
New message:      Pt is calling and wanting to know the results of her myocardial perfusion back in 03/08/15. Pt states she has had brain surgery does not remember the results of this test.

## 2017-11-02 DIAGNOSIS — J449 Chronic obstructive pulmonary disease, unspecified: Secondary | ICD-10-CM | POA: Diagnosis not present

## 2017-11-02 DIAGNOSIS — J441 Chronic obstructive pulmonary disease with (acute) exacerbation: Secondary | ICD-10-CM | POA: Diagnosis not present

## 2017-11-09 ENCOUNTER — Other Ambulatory Visit: Payer: Self-pay | Admitting: Family Medicine

## 2017-11-09 DIAGNOSIS — E119 Type 2 diabetes mellitus without complications: Secondary | ICD-10-CM

## 2017-11-09 DIAGNOSIS — J3089 Other allergic rhinitis: Secondary | ICD-10-CM

## 2017-11-10 DIAGNOSIS — N6092 Unspecified benign mammary dysplasia of left breast: Secondary | ICD-10-CM | POA: Diagnosis not present

## 2017-11-10 DIAGNOSIS — K439 Ventral hernia without obstruction or gangrene: Secondary | ICD-10-CM | POA: Diagnosis not present

## 2017-11-10 DIAGNOSIS — C678 Malignant neoplasm of overlapping sites of bladder: Secondary | ICD-10-CM | POA: Diagnosis not present

## 2017-12-02 ENCOUNTER — Other Ambulatory Visit: Payer: Self-pay | Admitting: Family Medicine

## 2017-12-03 DIAGNOSIS — J441 Chronic obstructive pulmonary disease with (acute) exacerbation: Secondary | ICD-10-CM | POA: Diagnosis not present

## 2017-12-03 DIAGNOSIS — J449 Chronic obstructive pulmonary disease, unspecified: Secondary | ICD-10-CM | POA: Diagnosis not present

## 2017-12-07 DIAGNOSIS — M7541 Impingement syndrome of right shoulder: Secondary | ICD-10-CM | POA: Diagnosis not present

## 2017-12-23 DIAGNOSIS — M7541 Impingement syndrome of right shoulder: Secondary | ICD-10-CM | POA: Diagnosis not present

## 2017-12-28 DIAGNOSIS — M25511 Pain in right shoulder: Secondary | ICD-10-CM | POA: Diagnosis not present

## 2017-12-28 DIAGNOSIS — M25611 Stiffness of right shoulder, not elsewhere classified: Secondary | ICD-10-CM | POA: Diagnosis not present

## 2017-12-28 DIAGNOSIS — S46811D Strain of other muscles, fascia and tendons at shoulder and upper arm level, right arm, subsequent encounter: Secondary | ICD-10-CM | POA: Diagnosis not present

## 2017-12-30 ENCOUNTER — Telehealth: Payer: Self-pay | Admitting: Family Medicine

## 2017-12-30 DIAGNOSIS — S46811D Strain of other muscles, fascia and tendons at shoulder and upper arm level, right arm, subsequent encounter: Secondary | ICD-10-CM | POA: Diagnosis not present

## 2017-12-30 DIAGNOSIS — M25611 Stiffness of right shoulder, not elsewhere classified: Secondary | ICD-10-CM | POA: Diagnosis not present

## 2017-12-30 DIAGNOSIS — M25511 Pain in right shoulder: Secondary | ICD-10-CM | POA: Diagnosis not present

## 2017-12-30 NOTE — Telephone Encounter (Signed)
Please advise patient: Pretty sure using oxygen won't get you excused.  Your physical therapy is a different story, as it could conflict. You likely need to get that from your orthopedist who is prescribing the PT.

## 2017-12-30 NOTE — Telephone Encounter (Signed)
PT requesting a note excusing her from jury duty since she has COPD which requires oxygen. Pt is also in PT for 4-6 weeks due to a torn rotator's cuff in her shoulder. Jury duty is going to be during her scheduled PT.

## 2017-12-30 NOTE — Telephone Encounter (Signed)
Pt made aware and she says she will check with orthopedist. [facility]

## 2018-01-02 DIAGNOSIS — J449 Chronic obstructive pulmonary disease, unspecified: Secondary | ICD-10-CM | POA: Diagnosis not present

## 2018-01-02 DIAGNOSIS — J441 Chronic obstructive pulmonary disease with (acute) exacerbation: Secondary | ICD-10-CM | POA: Diagnosis not present

## 2018-01-06 DIAGNOSIS — S46811D Strain of other muscles, fascia and tendons at shoulder and upper arm level, right arm, subsequent encounter: Secondary | ICD-10-CM | POA: Diagnosis not present

## 2018-01-06 DIAGNOSIS — M25511 Pain in right shoulder: Secondary | ICD-10-CM | POA: Diagnosis not present

## 2018-01-06 DIAGNOSIS — M25611 Stiffness of right shoulder, not elsewhere classified: Secondary | ICD-10-CM | POA: Diagnosis not present

## 2018-01-08 DIAGNOSIS — M25511 Pain in right shoulder: Secondary | ICD-10-CM | POA: Diagnosis not present

## 2018-01-08 DIAGNOSIS — S46811D Strain of other muscles, fascia and tendons at shoulder and upper arm level, right arm, subsequent encounter: Secondary | ICD-10-CM | POA: Diagnosis not present

## 2018-01-08 DIAGNOSIS — M25611 Stiffness of right shoulder, not elsewhere classified: Secondary | ICD-10-CM | POA: Diagnosis not present

## 2018-01-11 ENCOUNTER — Other Ambulatory Visit: Payer: Self-pay | Admitting: Family Medicine

## 2018-01-11 DIAGNOSIS — J3089 Other allergic rhinitis: Secondary | ICD-10-CM

## 2018-01-11 DIAGNOSIS — E119 Type 2 diabetes mellitus without complications: Secondary | ICD-10-CM

## 2018-01-11 DIAGNOSIS — M25511 Pain in right shoulder: Secondary | ICD-10-CM | POA: Diagnosis not present

## 2018-01-11 DIAGNOSIS — M25611 Stiffness of right shoulder, not elsewhere classified: Secondary | ICD-10-CM | POA: Diagnosis not present

## 2018-01-11 DIAGNOSIS — S46811D Strain of other muscles, fascia and tendons at shoulder and upper arm level, right arm, subsequent encounter: Secondary | ICD-10-CM | POA: Diagnosis not present

## 2018-01-12 ENCOUNTER — Ambulatory Visit: Payer: Medicare HMO | Admitting: Pulmonary Disease

## 2018-01-13 DIAGNOSIS — M25511 Pain in right shoulder: Secondary | ICD-10-CM | POA: Diagnosis not present

## 2018-01-13 DIAGNOSIS — S46811D Strain of other muscles, fascia and tendons at shoulder and upper arm level, right arm, subsequent encounter: Secondary | ICD-10-CM | POA: Diagnosis not present

## 2018-01-13 DIAGNOSIS — M25611 Stiffness of right shoulder, not elsewhere classified: Secondary | ICD-10-CM | POA: Diagnosis not present

## 2018-01-18 DIAGNOSIS — S46811D Strain of other muscles, fascia and tendons at shoulder and upper arm level, right arm, subsequent encounter: Secondary | ICD-10-CM | POA: Diagnosis not present

## 2018-01-18 DIAGNOSIS — M25511 Pain in right shoulder: Secondary | ICD-10-CM | POA: Diagnosis not present

## 2018-01-18 DIAGNOSIS — M25611 Stiffness of right shoulder, not elsewhere classified: Secondary | ICD-10-CM | POA: Diagnosis not present

## 2018-01-20 DIAGNOSIS — M25511 Pain in right shoulder: Secondary | ICD-10-CM | POA: Diagnosis not present

## 2018-01-20 DIAGNOSIS — M25611 Stiffness of right shoulder, not elsewhere classified: Secondary | ICD-10-CM | POA: Diagnosis not present

## 2018-01-20 DIAGNOSIS — S46811D Strain of other muscles, fascia and tendons at shoulder and upper arm level, right arm, subsequent encounter: Secondary | ICD-10-CM | POA: Diagnosis not present

## 2018-01-25 DIAGNOSIS — M25611 Stiffness of right shoulder, not elsewhere classified: Secondary | ICD-10-CM | POA: Diagnosis not present

## 2018-01-25 DIAGNOSIS — S46811D Strain of other muscles, fascia and tendons at shoulder and upper arm level, right arm, subsequent encounter: Secondary | ICD-10-CM | POA: Diagnosis not present

## 2018-01-25 DIAGNOSIS — M25511 Pain in right shoulder: Secondary | ICD-10-CM | POA: Diagnosis not present

## 2018-01-27 DIAGNOSIS — M75121 Complete rotator cuff tear or rupture of right shoulder, not specified as traumatic: Secondary | ICD-10-CM | POA: Diagnosis not present

## 2018-02-02 DIAGNOSIS — J449 Chronic obstructive pulmonary disease, unspecified: Secondary | ICD-10-CM | POA: Diagnosis not present

## 2018-02-02 DIAGNOSIS — J441 Chronic obstructive pulmonary disease with (acute) exacerbation: Secondary | ICD-10-CM | POA: Diagnosis not present

## 2018-02-03 ENCOUNTER — Other Ambulatory Visit: Payer: Self-pay | Admitting: Family Medicine

## 2018-02-04 NOTE — Telephone Encounter (Signed)
Pt has an appt on 9/16

## 2018-02-08 ENCOUNTER — Other Ambulatory Visit: Payer: Self-pay | Admitting: Family Medicine

## 2018-02-11 DIAGNOSIS — H5310 Unspecified subjective visual disturbances: Secondary | ICD-10-CM | POA: Diagnosis not present

## 2018-02-11 LAB — HM DIABETES EYE EXAM

## 2018-02-18 DIAGNOSIS — H0014 Chalazion left upper eyelid: Secondary | ICD-10-CM | POA: Diagnosis not present

## 2018-02-18 DIAGNOSIS — H0011 Chalazion right upper eyelid: Secondary | ICD-10-CM | POA: Diagnosis not present

## 2018-02-18 DIAGNOSIS — H5310 Unspecified subjective visual disturbances: Secondary | ICD-10-CM | POA: Diagnosis not present

## 2018-02-25 ENCOUNTER — Ambulatory Visit: Payer: Medicare HMO | Admitting: Pulmonary Disease

## 2018-02-25 ENCOUNTER — Encounter: Payer: Self-pay | Admitting: Pulmonary Disease

## 2018-02-25 VITALS — BP 132/76 | HR 95 | Ht 64.5 in | Wt 245.0 lb

## 2018-02-25 DIAGNOSIS — J449 Chronic obstructive pulmonary disease, unspecified: Secondary | ICD-10-CM

## 2018-02-25 DIAGNOSIS — Z87891 Personal history of nicotine dependence: Secondary | ICD-10-CM | POA: Diagnosis not present

## 2018-02-25 MED ORDER — FLUTICASONE-UMECLIDIN-VILANT 100-62.5-25 MCG/INH IN AEPB: 1.0000 | INHALATION_SPRAY | Freq: Every day | RESPIRATORY_TRACT | 0 refills | Status: AC

## 2018-02-25 NOTE — Progress Notes (Addendum)
RHYAN WOLTERS    [id number]    1945-08-27  Primary Care Physician:Knapp, Tera Helper, MD  Referring Physician: [name], [address], [place] [address]  Chief complaint:  Follow up for COPD GOLD D  HPI: [name] is a 72 year old with COPD, moderate obstructive defect. She was previously seen by Dr. Melvyn Novas in 2013. She has been maintained on Symbicort since 2013. She was hospitalized in February 2017 for the flu and COPD exacerbation. Switched to trelegy inhaler in 2018 with good response.   She has had an eventful year in 2018.  She lost her sister to breast cancer in early 2018, was diagnosed with breast cancer s/p lumpectomy, had bladder cancer and underwent resection and intravesicular chemo, fell and broke her right shoulder, underwent ventral hernia repair with mesh insertion.  Hospitalized for 2 days in April 2019 with COPD exacerbation.  Treated with IV steroids, nebs, Zithromax. Noted to have multiple bilateral lower extremity edema and given Lasix  Interim History: This is been a stable-year for Mrs. Couts.  She continues on trelegy No recent exacerbations.  Dyspnea on exertion is stable at baseline.  Contents of chronic cough with white mucus.  Outpatient Encounter Medications as of 02/25/2018  Medication Sig  . acetaminophen (TYLENOL) 500 MG tablet Take 1,000 mg every 6 (six) hours as needed by mouth for moderate pain or headache.   . albuterol (PROVENTIL HFA;VENTOLIN HFA) 108 (90 Base) MCG/ACT inhaler Inhale 2 puffs every 6 (six) hours as needed into the lungs for wheezing or shortness of breath.  Marland Kitchen albuterol (PROVENTIL) (2.5 MG/3ML) 0.083% nebulizer solution USE 1 VIAL BY NEBULIZATION EVERY FOUR HOURS AS NEEDED FOR WHEEZING OR SHORTNESS OF BREATH.  Marland Kitchen aspirin EC 81 MG tablet Take 81 mg daily by mouth.  . bisoprolol-hydrochlorothiazide (ZIAC) 10-6.25 MG tablet TAKE 1 TABLET EVERY DAY  . Calcium Carbonate-Vitamin D (CALCIUM 600+D) 600-400 MG-UNIT  per tablet Take 1 tablet by mouth daily.   . cetirizine (KLS ALLER-TEC) 10 MG tablet Take 10 mg by mouth daily.  . Cholecalciferol (EQL VITAMIN D3) 2000 units CAPS Take 4,000 Units daily by mouth.  . fluticasone (FLONASE) 50 MCG/ACT nasal spray USE 2 SPRAYS IN EACH NOSTRIL EVERY DAY  . furosemide (LASIX) 20 MG tablet TAKE 1 TABLET (20 MG TOTAL) BY MOUTH DAILY AS NEEDED FOR FLUID OR EDEMA.  . metFORMIN (GLUCOPHAGE-XR) 500 MG 24 hr tablet TAKE 1 TABLET THREE TIMES DAILY  . Multiple Vitamins-Minerals (CENTRUM SILVER PO) Take 1 tablet by mouth daily.   . simvastatin (ZOCOR) 20 MG tablet TAKE 1 TABLET AT BEDTIME  . TRELEGY ELLIPTA 100-62.5-25 MCG/INH AEPB INHALE 1 PUFF INTO THE LUNGS EVERY DAY  . TRUEPLUS LANCETS 33G MISC TEST ONE TIME DAILY  . valACYclovir (VALTREX) 500 MG tablet TAKE 1 TABLET BY MOUTH EVERY DAY  . [DISCONTINUED] predniSONE (DELTASONE) 10 MG tablet Take 2 tablets by mouth x 2days, then 1 tablet daily for 3d, then 1/2 tablet daily   No facility-administered encounter medications on file as of 02/25/2018.    Physical Exam: Blood pressure 132/76, pulse 95, height 5' 4.5" (1.638 m), weight 245 lb (111.1 kg), SpO2 91 %. Gen:      No acute distress HEENT:  EOMI, sclera anicteric Neck:     No masses; no thyromegaly Lungs:    Clear to auscultation bilaterally; normal respiratory effort CV:         Regular rate and rhythm; no murmurs Abd:      +  bowel sounds; soft, non-tender; no palpable masses, no distension Ext:    No edema; adequate peripheral perfusion Skin:      Warm and dry; no rash Neuro: alert and oriented x 3 Psych: normal mood and affect  Data Reviewed: Imaging Screening CT chest 09/10/16 -moderate emphysema Screening CT 09/16/17-moderate emphysema, scattered subcentimeter pulmonary nodules.  Fatty liver with findings of cirrhosis. Chest x-ray 09/29/17-no active cardio pulmonary disease I reviewed the images personally.  PFTs 08/28/11 FVC 2.71 [92%], FEV1 1.33 (62%), F/F  49, TLC 114%, DLCO 46% Moderate obstructive defect with moderate reduction in diffusion capacity. No bronchodilator response  11/08/15 FVC 2.05 [7%), FEV1 1.23 (55%), F/F 60 Moderate obstructive defect  09/24/16 FVC 2. (82%), FEV1 1.43 (62%), F/F 58 , TLC 99%, DLCO 54% Moderate obstruction with moderate diffusion defect. No bronchodilator response.  Labs A1AT 08/08/16- 162, PIMM CBC 10/01/2017-WBC 5.1, eos 0%  Assessment:  COPD GOLD D Stable on trelegy and albuterol as needed Can change her to LABA/LAMA combination with Anoro.  Likely does not need inh steroids as there is no bronchodilator response and peripheral eos are low.  Since she is in the donut hole now will not make any changes today. Reevaluate next year.   Given oxygen after last hospitalization but she is hardly using it O2 sats dropped to 82% on exertion today.  Emphasized to her that she needs to use oxygen during exertion and at night  A1AT levels are normal and screening CT is ok.  Finished pulmonary rehab in 2018 Continue low dose screening CT  Health maintenance  03/05/17-Influenza high-dose 07/27/10- Pneumovax 23 07/06/14- Prevnar 13  Plan/Recommendations: - Continue trelegy, supplemental O2 with exertion and at night - Continue low dose screening CT  Marshell Garfinkel MD [facility] 02/25/2018, 2:27 PM  CC: [name], MD

## 2018-02-25 NOTE — Patient Instructions (Signed)
Continue inhalers as prescribed I will see her back in January or February for reevaluation.

## 2018-03-05 DIAGNOSIS — J449 Chronic obstructive pulmonary disease, unspecified: Secondary | ICD-10-CM | POA: Diagnosis not present

## 2018-03-05 DIAGNOSIS — J441 Chronic obstructive pulmonary disease with (acute) exacerbation: Secondary | ICD-10-CM | POA: Diagnosis not present

## 2018-03-11 ENCOUNTER — Other Ambulatory Visit: Payer: Medicare HMO

## 2018-03-11 DIAGNOSIS — E7800 Pure hypercholesterolemia, unspecified: Secondary | ICD-10-CM

## 2018-03-11 DIAGNOSIS — E119 Type 2 diabetes mellitus without complications: Secondary | ICD-10-CM

## 2018-03-12 LAB — COMPREHENSIVE METABOLIC PANEL
ALT: 25 IU/L (ref 0–32)
AST: 36 IU/L (ref 0–40)
Albumin/Globulin Ratio: 1.8 (ref 1.2–2.2)
Albumin: 4.1 g/dL (ref 3.5–4.8)
Alkaline Phosphatase: 64 IU/L (ref 39–117)
BUN/Creatinine Ratio: 19 (ref 12–28)
BUN: 14 mg/dL (ref 8–27)
Bilirubin Total: 0.7 mg/dL (ref 0.0–1.2)
CO2: 25 mmol/L (ref 20–29)
Calcium: 9.7 mg/dL (ref 8.7–10.3)
Chloride: 102 mmol/L (ref 96–106)
Creatinine, Ser: 0.72 mg/dL (ref 0.57–1.00)
GFR calc Af Amer: 97 mL/min/{1.73_m2} (ref >59)
GFR calc non Af Amer: 85 mL/min/{1.73_m2} (ref >59)
Globulin, Total: 2.3 g/dL (ref 1.5–4.5)
Glucose: 155 mg/dL — ABNORMAL HIGH (ref 65–99)
Potassium: 4.7 mmol/L (ref 3.5–5.2)
Sodium: 144 mmol/L (ref 134–144)
Total Protein: 6.4 g/dL (ref 6.0–8.5)

## 2018-03-12 LAB — TSH: TSH: 2.97 u[IU]/mL (ref 0.450–4.500)

## 2018-03-12 LAB — HEMOGLOBIN A1C
Est. average glucose Bld gHb Est-mCnc: 146 mg/dL
Hgb A1c MFr Bld: 6.7 % — ABNORMAL HIGH (ref 4.8–5.6)

## 2018-03-14 NOTE — Progress Notes (Signed)
Chief Complaint  Patient presents with  . Medicare Wellness    nonfasting AWV/CPE with pelvic. No new complaints. I put pt on HD Flu list. Had both Shingrix vaccines at pharmacy and I pulled them in from pharmacy.    Shannon Schultz is a 72 y.o. female who presents for annual physical exam, Medicare wellness visit and follow-up on chronic medical conditions.  She has the following concerns:  Has been seeing Dr. Kathlen Mody. Has been bothered with chalazia--the one of the left resolved, the one on the right started recurring a few days after completing the Keflex. She is still using the Maxitrol ointment.  She originally went in mid August with some visual complaints--(if she gets up to go to the bathroom, or in the morning, in dim lighting--sees spots in her vision only when looking straight ahead).  It has since completely resolved.  Had similar episodes in the past that also completely resolved.  Prior eye doctor mentioned possibly related to optic migraines.  Dr. Kathlen Mody sent a note to consider Holter monitor (supposedly to evaluate for noctural hypotension) and MRI of brain due to her h/o breast cancer.  Diabetes: Blood sugars at home are running110-135, once had 177 (can't recall why it was high); these are fasting, not checking other times of the day. Denies hypoglycemia. Denies polydipsia and polyuria. Last yearly eye exam was in 09/2017, no retinopathy found. Has had more recent visits as stated above (acute visits). Patient follows a low sugar diet and checks feet regularly without concerns. Denies numbness, tingling. Some intermittent numbness in the 2nd and 3rd toes on the right foot (h/o fractures, didn't heal right, per pt). No skin lesions. Takes metformin TID--this seems to be the most tolerable way to take it.  Hyperlipidemia follow-up: Patient is reportedly following a low-fat, low cholesterol diet. Compliant with simvastatin and denies medication side effects. Lipids were at goal on last  check. Lab Results  Component Value Date   CHOL 108 09/14/2017   HDL 51 09/14/2017   LDLCALC 38 09/14/2017   TRIG 96 09/14/2017   CHOLHDL 2.1 09/14/2017   Hypertension: Blood pressure is only checked sporadically, always "fine".  Hasn't checked it recently, can't recall the values. Denies headaches, dizziness, chest pain. Denies side effects of medications. Compliant with medication.   COPD: She is doing well on Trelegy, last saw pulmonologist last month. She has a lot left, so no changes were made, but the plan is to change to Anoro when she runs out (January). S/p hospitalization in April doing well since then.   Allergies:Using flonase nightly, allergies are controlled. No further nosebleeds since aiming it to the outside of the nose.  She was diagnosed with atypical ductal hyperplasia after a mammogram revealed some abnormalities. She underwent left lumpectomyon 6/25/18and final pathology revealed atypical ductal hyperplasia and atypical lobular hyperplasia. She last saw Dr. Lindi Adie (onc) 02/16/17. No additional treatments were recommended, just 30" exercise daily, weight loss, more fruits/vegetables and less red meat.  H/o bladder cancer--lesion in bladder was noted incidentally on CT 11/2016 prior to hernia surgery. She had bladder mass removed by urology in 12/2016, and pathology showedLOW GRADE PAPILLARY UROTHELIAL CARCINOMA She was treated with intravesicular post-op chemo. No further problems, and she has seen urologist in follow-up since then.  She had the ventral hernia repaired in 04/2017. She has had some ongoing issues with the mesh and cystic fluid collections. It has all finally resolved.  She had been taking valtrex preventatively for HSV flare on  hip. She stopped taking it after getting second dose of Shingrix (August), thinking that she couldn't get them anymore.  No flares since stopping it. Last flare was the end of last year (November), when she cut the dose  in half, to make it last longer. After that she went back to the full pill, and had no further flares.  No flares since stopping it last month.  Right rotator cuff tear--seen on MRI 06/2017.  Was scheduled for surgery, but was delayed to due to dental issues, then hernia repair and cellulitis.  Her should pain had improved, doing well until another injury occurred in June. She saw Dr. Rhona Raider and had cortisone injection in June and physical therapy.  She still has pain, and will eventually have surgery.  H/o bilateral trochanteric bursitis--treated with cortisone injection into the right trochanteric bursa on 02/26/17. benefit was short-lived, still has discomfort, but less since sleeping on the couch. Both hips are back to hurting.  She had been sleeping on the couch mostly due to pain in her shoulder, and found that also didn't bother her hip. She changed to a lift chair, instead of the couch.  Shoulder is better enough that she could sleep in bed on her right side, but she is still sleeping in the chair due to her hip pain.  She would like to try another cortisone shot.  Immunization History  Administered Date(s) Administered  . Influenza Split 05/28/2011  . Influenza, High Dose Seasonal PF 04/07/2013, 05/15/2014, 05/30/2015, 03/25/2016, 03/05/2017  . Influenza, Seasonal, Injecte, Preservative Fre 06/07/2012  . Pneumococcal Conjugate-13 07/06/2014  . Pneumococcal Polysaccharide-23 12/15/2004, 05/28/2011  . Tdap 02/23/2008  . Zoster 05/17/2010  Had Shingrix in June and August 2019 Last Pap smear: 02/2017--normal, no high risk HPV detected Last mammogram: April 2019 Last colonoscopy: 12/2015 with Dr. Mann--diverticulosis and internal hemorrhoids.  F/U recommend 7 years (12/2022) Last DEXA: 12/2014 normal Dentist: twice a year Ophtho: yearly (at least) Exercise: none currently, just keeping her grandchildren.   Depression screen: negative Fall screen: negative Function status screen:  unremarkable.  Previously noted  "constant roar in her ears"--no longer notices this or is aware of it (has seen ENT for this years ago--no change; some slight hearing loss); has cataracts--foggy vision at night, unchanged (not ready for surgery) Mini-Cog screen: normal (score of 5)  Dermatologist--Dr. Durward Fortes Dentist--Dr. Kalman Shan Ophtho--Dr. Kathlen Mody GI--Dr. Collene Mares Cardiologist: Dr. Marlou Porch Oncologist: Dr. Lindi Adie Surgeon: Dr. Marlou Starks Urologist: Dr. Alinda Money Pulmonary: Dr. Vaughan Browner Ortho: Dr. Rhona Raider  End of Life Discussion: Patient hasa living will and medical power of attorney, scanned in chart.  Past Medical History:  Diagnosis Date  . Aortic stenosis    mild-mod by 04/08/16 echo; mod AS 04/2017  . Arthritis    "hands" (05/15/2017)  . Bladder cancer ([place]) 2018  . Breast cancer, right ([place]) 1992   DCIS,bladder ca (just dx)  . Colon polyp   . Complication of anesthesia 1992   "local anesthesia" used was hard to awaken from-no problems since (05/15/2017)  . COPD (chronic obstructive pulmonary disease) ([place])   . Diverticulosis   . Dyspnea   . Elevated cholesterol   . Elevated liver enzymes    fatty liver per ultrasound per pt  . Environmental allergies    "I have allergies year round" (05/15/2017)  . Family history of malignant neoplasm of breast   . FHx: BRCA2 gene positive    sister with BRCA2 mutation (pt tested NEGATIVE)  . Genital HSV    gets on  hip  . Heart murmur     echo 05/2011 mild aortic stenosis  . HSV (herpes simplex virus) infection    on hip--on daily suppression  . Hypertension   . Hypothyroidism    took med 7 yrs after birth of 1st child  . Impaired glucose tolerance   . Migraine   . On home oxygen therapy    "have it available but I'm not using it" (05/15/2017)  . Osteopenia   . Pneumonia ~ 1950; 08/06/2010  . Type 2 diabetes mellitus ([place])   . Ventral hernia   . Vitamin D deficiency disease     Past Surgical History:  Procedure Laterality Date  .  ABDOMINAL HERNIA REPAIR  05/15/2017  . BREAST BIOPSY Right 1992  . BREAST BIOPSY Left 2018  . BREAST EXCISIONAL BIOPSY Left 2018   ATYPICAL DUCTAL HYPERPLASIA INVOLVING A COMPLEX  . BREAST LUMPECTOMY WITH RADIOACTIVE SEED LOCALIZATION Left 12/22/2016   Procedure: LEFT BREAST LUMPECTOMY WITH RADIOACTIVE SEED LOCALIZATION;  Surgeon: Jovita Kussmaul, MD;  Location: [facility];  Service: General;  Laterality: Left;  . COLONOSCOPY  2009, 08/2010, 12/2015   Dr. Collene Mares; "only 1 had any polyps" (05/15/2017)  . CYSTOSCOPY  04/23/2017  . CYSTOSCOPY W/ RETROGRADES Bilateral 01/12/2017   Procedure: CYSTOSCOPY WITH RETROGRADE PYELOGRAM/ EXAM UNDER ANESTHESIA;  Surgeon: Raynelle Bring, MD;  Location: [facility];  Service: Urology;  Laterality: Bilateral;  . HERNIA REPAIR    . INSERTION OF MESH N/A 05/15/2017   Procedure: INSERTION OF MESH;  Surgeon: Jovita Kussmaul, MD;  Location: [facility];  Service: General;  Laterality: N/A;  . LAPAROSCOPIC CHOLECYSTECTOMY  2003  . MASTECTOMY Right 1992  . TRANSURETHRAL RESECTION OF BLADDER TUMOR WITH MITOMYCIN-C N/A 01/12/2017   Procedure: TRANSURETHRAL RESECTION OF BLADDER TUMOR WITH POSSIBLE POST OPERATIVE INSTILLATION OF MITOMYCIN-C;  Surgeon: Raynelle Bring, MD;  Location: [facility];  Service: Urology;  Laterality: N/A;  . TYMPANOSTOMY TUBE PLACEMENT Bilateral 1980s  . UMBILICAL HERNIA REPAIR  2003  . VENTRAL HERNIA REPAIR N/A 05/15/2017   Procedure: VENTRAL HERNIA REPAIR WITH MESH;  Surgeon: Jovita Kussmaul, MD;  Location: [facility];  Service: General;  Laterality: N/A;    Social History   Socioeconomic History  . Marital status: Married    Spouse name: Not on file  . Number of children: 3  . Years of education: Not on file  . Highest education level: Not on file  Occupational History  . Occupation: Retired  Scientific laboratory technician  . Financial resource strain: Not on file  . Food insecurity:    Worry: Not on file    Inability: Not on file  . Transportation needs:    Medical: Not on file     Non-medical: Not on file  Tobacco Use  . Smoking status: Former Smoker    Packs/day: 1.00    Years: 46.00    Pack years: 46.00    Types: Cigarettes    Last attempt to quit: 05/31/2011    Years since quitting: 6.7  . Smokeless tobacco: Never Used  Substance and Sexual Activity  . Alcohol use: No    Comment: very rare, <1/year  . Drug use: No  . Sexual activity: Yes    Partners: Male    Birth control/protection: Post-menopausal  Lifestyle  . Physical activity:    Days per week: Not on file    Minutes per session: Not on file  . Stress: Not on file  Relationships  . Social connections:    Talks  on phone: Not on file    Gets together: Not on file    Attends religious service: Not on file    Active member of club or organization: Not on file    Attends meetings of clubs or organizations: Not on file    Relationship status: Not on file  . Intimate partner violence:    Fear of current or ex partner: Not on file    Emotionally abused: Not on file    Physically abused: Not on file    Forced sexual activity: Not on file  Other Topics Concern  . Not on file  Social History Narrative   Lives with her husband.  Children all live in [place] nearby. No pets. 6 grandchildren.    Family History  Problem Relation Age of Onset  . Heart disease Father   . Diabetes Father   . Hypertension Father   . Asthma Sister   . Allergies Sister   . Hyperlipidemia Sister   . Hashimoto's thyroiditis Sister   . Fibromyalgia Sister   . Heart disease Mother        tachycardia  . Asthma Sister   . Allergies Sister   . Hyperlipidemia Sister   . Hashimoto's thyroiditis Sister   . Fibromyalgia Sister   . Breast cancer Sister 33       BRCA2 positive; metastatic to bones at age 51, then spread to liver  . Cancer Other        female cancers, bone cancer  . Cancer Maternal Grandmother        deceased 71; unk. primary; possibly stomach  . Tuberculosis Maternal Grandfather   . Stroke Paternal  Grandmother 40       died of cerebral hemorrhage    Outpatient Encounter Medications as of 03/15/2018  Medication Sig  . aspirin EC 81 MG tablet Take 81 mg daily by mouth.  . bisoprolol-hydrochlorothiazide (ZIAC) 10-6.25 MG tablet TAKE 1 TABLET EVERY DAY  . Calcium Carbonate-Vitamin D (CALCIUM 600+D) 600-400 MG-UNIT per tablet Take 1 tablet by mouth daily.   . cetirizine (KLS ALLER-TEC) 10 MG tablet Take 10 mg by mouth daily.  . Cholecalciferol (EQL VITAMIN D3) 2000 units CAPS Take 4,000 Units daily by mouth.  . fluticasone (FLONASE) 50 MCG/ACT nasal spray USE 2 SPRAYS IN EACH NOSTRIL EVERY DAY  . metFORMIN (GLUCOPHAGE-XR) 500 MG 24 hr tablet TAKE 1 TABLET THREE TIMES DAILY  . Multiple Vitamins-Minerals (CENTRUM SILVER PO) Take 1 tablet by mouth daily.   . simvastatin (ZOCOR) 20 MG tablet TAKE 1 TABLET AT BEDTIME  . TRELEGY ELLIPTA 100-62.5-25 MCG/INH AEPB INHALE 1 PUFF INTO THE LUNGS EVERY DAY  . TRUEPLUS LANCETS 33G MISC TEST ONE TIME DAILY  . acetaminophen (TYLENOL) 500 MG tablet Take 1,000 mg every 6 (six) hours as needed by mouth for moderate pain or headache.   . albuterol (PROVENTIL HFA;VENTOLIN HFA) 108 (90 Base) MCG/ACT inhaler Inhale 2 puffs every 6 (six) hours as needed into the lungs for wheezing or shortness of breath.  Marland Kitchen albuterol (PROVENTIL) (2.5 MG/3ML) 0.083% nebulizer solution USE 1 VIAL BY NEBULIZATION EVERY FOUR HOURS AS NEEDED FOR WHEEZING OR SHORTNESS OF BREATH. (Patient not taking: Reported on 03/15/2018)  . furosemide (LASIX) 20 MG tablet TAKE 1 TABLET (20 MG TOTAL) BY MOUTH DAILY AS NEEDED FOR FLUID OR EDEMA. (Patient not taking: Reported on 03/15/2018)  . [DISCONTINUED] valACYclovir (VALTREX) 500 MG tablet TAKE 1 TABLET BY MOUTH EVERY DAY   No facility-administered encounter medications on file as  of 03/15/2018.     Allergies  Allergen Reactions  . Contrast Media [Iodinated Diagnostic Agents] Anaphylaxis, Shortness Of Breath and Other (See Comments)    Could not  breath  . Iohexol Anaphylaxis, Shortness Of Breath and Other (See Comments)    Immediately could not breathe  . Lisinopril Anaphylaxis, Shortness Of Breath and Rash  . Sulfa Antibiotics Anaphylaxis and Other (See Comments)    Historical from mother, pt states that mother says she almost died from this drug  . Latex Other (See Comments)    Unless against on skin for a long time, blisters. Short term is okay.  . Codeine Nausea Only, Anxiety and Other (See Comments)    insomnia  . Levofloxacin Other (See Comments)    insomnia  . Lipitor [Atorvastatin Calcium] Rash  . Vicodin [Hydrocodone-Acetaminophen] Itching    ROS: The patient denies anorexia, fever, vision changes (foggy vision at night related to cataracts), decreased hearing (slight loss, no longer really notices the tinnitus/"roar" previously described), ear pain, sore throat, breast concerns, chest pain, palpitations, dizziness, syncope, cough, swelling, nausea, vomiting, constipation, abdominal pain, melena, hematochezia, indigestion/heartburn, hematuria, incontinence, dysuria, vaginal bleeding, discharge, odor or itch, genital lesions,numbness (previously hadL-sided facial numbness x 5-10 mins very sporadically, hasn't had it recently; never hadother associated symptoms--no weakness, slurred speech, or associated with headache), tingling, weakness, tremor, suspicious skin lesions, depression, anxiety, abnormal bleeding/bruising, or enlarged lymph nodes. Denies any memory concerns. Some chronic mild allergies. No migraine headaches, just the visual changes mentioned in HPI, which have subsequently resolved. Only rare gassiness/diarrhea (related to metformin--had more diarrhea when all tablets taken together; tolerable taking it TID) Some hand pain from arthritis, mostly in the thumbs; occasional right knee discomfort Intermittent hoarseness, from Trelegy.  Some tingling in right 2nd and 3rd toes, chronic/stable. R shoulder pain  (recent re-injury in June, pain persists). Bursitis bilateral hips--no pain with walking, just to lay on it No further back pain.   PHYSICAL EXAM:  BP 130/68   Pulse 84   Ht '5\' 3"'  (1.6 m)   Wt 243 lb 9.6 oz (110.5 kg)   LMP  (LMP Unknown)   BMI 43.15 kg/m   Wt Readings from Last 3 Encounters:  03/15/18 243 lb 9.6 oz (110.5 kg)  02/25/18 245 lb (111.1 kg)  10/12/17 238 lb 12.8 oz (108.3 kg)    General Appearance:  Alert, cooperative, no distress, appears stated age.   Head:  Normocephalic, without obvious abnormality, atraumatic   Eyes:  PERRL, conjunctiva/corneas clear, EOM's intact, fundi benign. Chalazion noted at right upper eyelid.  Ears:  Normal TM's and external ear canals   Nose:  Nares normal, mucosa normal, no drainage or sinus tenderness   Throat:  Lips, mucosa, and tongue normal; teeth and gums normal   Neck:  Supple, no lymphadenopathy; thyroid: no enlargement/ tenderness/nodules; no carotid bruit or JVD   Back:  Spine nontender, no curvature, ROM normal, no CVA tenderness   Lungs:  Clear to auscultation bilaterally without wheezes, rales or ronchi; respirations unlabored   Chest Wall:  No tenderness or mass. Absence of R breast, WHSS.   Heart:  Regular rate and rhythm, S1 and S2 normal, no rub or gallop. 2-3/6 SEM loudest at RUSB, and radiates into R>L carotid   Breast Exam:  No tenderness, masses, or nipple discharge or inversion of L breast. WHSS along superior aspect of areola. No axillary lymphadenopathy. Right breast is absent.   Abdomen:  Soft, non-tender, nondistended, normoactive bowel sounds, no masses,  no hepatosplenomegaly. + abdominal obesity. WHSS  Genitalia:  Normal external genitalia without lesions, mild atrophic changes. BUS and vagina normal; No cervical motion tenderness. No abnormal vaginal discharge. Uterus and adnexa not enlarged, nontender, no masses, although exam is limited by body habitus. Pap not performed   Rectal:   Normal sphincter tone, no mass. Heme negative stool  Extremities:  Trace pretibial edema. Normal diabetic foot exam. Tender at right trochanteric bursa  Pulses:  2+ and symmetric all extremities   Skin:  Skin color, texture, turgor normal. Venous stasis changes bilateral lower legs with (hyperpigmented diffusely); some prominent superficial veins in feet giving bluish discoloration, L>R. Prominent vein right shin. Feet are very dry, flaky. Normal sensation to monofilament. She has some erythema/mild intertrigo at skin fold below pannus.  No satellite lesions. Other skin folds are normal  Lymph nodes:  Cervical, supraclavicular, and axillary nodes normal   Neurologic:  CNII-XII intact, normal strength, sensation and gait; reflexes 2+ and symmetric throughout    Psych:  Normal mood, affect, hygiene and grooming  Diabetic foot exam performed   Lab Results  Component Value Date   HGBA1C 6.7 (H) 03/11/2018     Chemistry      Component Value Date/Time   NA 144 03/11/2018 0842   K 4.7 03/11/2018 0842   CL 102 03/11/2018 0842   CO2 25 03/11/2018 0842   BUN 14 03/11/2018 0842   CREATININE 0.72 03/11/2018 0842   CREATININE 0.75 03/05/2017 0736      Component Value Date/Time   CALCIUM 9.7 03/11/2018 0842   ALKPHOS 64 03/11/2018 0842   AST 36 03/11/2018 0842   ALT 25 03/11/2018 0842   BILITOT 0.7 03/11/2018 0842     Fasting glucose 155  Lab Results  Component Value Date   TSH 2.970 03/11/2018    ASSESSMENT/PLAN:  Annual physical exam - Plan: POCT Urinalysis DIP (Proadvantage Device)  Medicare annual wellness visit, subsequent  COPD with hypoxia ([place]) - stable, monitored by pulmonary  Type 2 diabetes mellitus without complication, without long-term current use of insulin ([facility]) - Continue metformin. Reviewed diet, exercise weight loss encouraged - Plan: Hemoglobin A1c, Comprehensive metabolic panel, Microalbumin / creatinine urine ratio  Seasonal  allergic rhinitis, unspecified trigger  Pure hypercholesterolemia - Plan: Lipid panel, Comprehensive metabolic panel  Aortic atherosclerosis ([facility])  Aortic valve stenosis, etiology of cardiac valve disease unspecified - stable asymptomatic  Essential hypertension, benign - Plan: Comprehensive metabolic panel  Cirrhosis of liver without ascites, unspecified hepatic cirrhosis type ([place]) - suspect fatty liver contributing. Discussed risks of obesity, weight loss encouraged - Plan: Comprehensive metabolic panel  HSV (herpes simplex virus) infection - discussed varicella vs HSV--shingrix shouldn't decrease flares. Doing well so far off Valtrex, to restart if frequent flares recur  Class 3 severe obesity due to excess calories with serious comorbidity and body mass index (BMI) of 40.0 to 44.9 in adult [facility]) - counseled in detail about diet, portions, exercise, weight loss  Intertrigo - at skin fold/pannus. Discussed keeping dry, using powder, weight loss. Not candidal at this point.  Ophthalmic migraine - resolved  Trochanteric bursitis of right hip - pt will return for cortisone injection  No need for MRI or Holter monitor at this time.  Holter would not evaluate for hypotension as suggested.  Her neuro exam is normal, and symptoms have resolved. She previously has been told she had ophthalmic migraines for similar symptoms.  She will let us know if any recurrent symptoms or other neurologic symptoms  develop.  R trochanteric bursitis--will return (when high dose flu shots are available) for cortisone shot.  F/u 6 months with labs prior c-met, lipids, urine microalbumin, A1c    Discussed monthly self breast exams and yearly mammograms; at least 30 minutes of aerobic activity at least 5 days/week and weight-bearing exercise 2x/week; proper sunscreen use reviewed; healthy diet, including goals of calcium and vitamin D intake and alcohol recommendations (less than or equal to 1 drink/day)  reviewed; regular seatbelt use; changing batteries in smoke detectors. Immunization recommendations discussed, UTD, continue yearly high dose flu shots (not yet available in our office). Tetanus booster due, needs to get from pharmacy, written rx given. Colonoscopy recommendations reviewed, UTD--f/u with Dr. Collene Mares due 12/2022.  Full Code, Full Care   Medicare Attestation I have personally reviewed: The patient's medical and social history Their use of alcohol, tobacco or illicit drugs Their current medications and supplements The patient's functional ability including ADLs,fall risks, home safety risks, cognitive, and hearing and visual impairment Diet and physical activities Evidence for depression or mood disorders  The patient's weight, height and BMI have been recorded in the chart.  I have made referrals, counseling, and provided education to the patient based on review of the above and I have provided the patient with a written personalized care plan for preventive services.    If you get a flare of the herpes on your hip, take the pill twice daily for 3 days, then back to taking it once daily (Valtrex). I think it was most likely herpes and not shingles that you get on your hip, and the shingles shot may not prevent the flares.  Since nothing happened so far, we can wait and see (stay off the Valtrex until a flare develops).  When the flu shot becomes available, schedule office visit and that way we can also give you the steroid injection for bursitis.

## 2018-03-15 ENCOUNTER — Encounter: Payer: Self-pay | Admitting: Family Medicine

## 2018-03-15 ENCOUNTER — Ambulatory Visit (INDEPENDENT_AMBULATORY_CARE_PROVIDER_SITE_OTHER): Payer: Medicare HMO | Admitting: Family Medicine

## 2018-03-15 VITALS — BP 130/68 | HR 84 | Ht 63.0 in | Wt 243.6 lb

## 2018-03-15 DIAGNOSIS — I10 Essential (primary) hypertension: Secondary | ICD-10-CM | POA: Diagnosis not present

## 2018-03-15 DIAGNOSIS — B009 Herpesviral infection, unspecified: Secondary | ICD-10-CM | POA: Diagnosis not present

## 2018-03-15 DIAGNOSIS — J449 Chronic obstructive pulmonary disease, unspecified: Secondary | ICD-10-CM

## 2018-03-15 DIAGNOSIS — J302 Other seasonal allergic rhinitis: Secondary | ICD-10-CM | POA: Diagnosis not present

## 2018-03-15 DIAGNOSIS — E119 Type 2 diabetes mellitus without complications: Secondary | ICD-10-CM

## 2018-03-15 DIAGNOSIS — E7800 Pure hypercholesterolemia, unspecified: Secondary | ICD-10-CM | POA: Diagnosis not present

## 2018-03-15 DIAGNOSIS — I350 Nonrheumatic aortic (valve) stenosis: Secondary | ICD-10-CM

## 2018-03-15 DIAGNOSIS — L304 Erythema intertrigo: Secondary | ICD-10-CM

## 2018-03-15 DIAGNOSIS — Z6841 Body Mass Index (BMI) 40.0 and over, adult: Secondary | ICD-10-CM

## 2018-03-15 DIAGNOSIS — M7061 Trochanteric bursitis, right hip: Secondary | ICD-10-CM

## 2018-03-15 DIAGNOSIS — R0902 Hypoxemia: Secondary | ICD-10-CM

## 2018-03-15 DIAGNOSIS — Z0000 Encounter for general adult medical examination without abnormal findings: Secondary | ICD-10-CM

## 2018-03-15 DIAGNOSIS — I700 Atherosclerosis of aorta: Secondary | ICD-10-CM

## 2018-03-15 DIAGNOSIS — G43109 Migraine with aura, not intractable, without status migrainosus: Secondary | ICD-10-CM

## 2018-03-15 DIAGNOSIS — K7460 Unspecified cirrhosis of liver: Secondary | ICD-10-CM | POA: Diagnosis not present

## 2018-03-15 LAB — POCT URINALYSIS DIP (PROADVANTAGE DEVICE)
Bilirubin, UA: NEGATIVE
Blood, UA: NEGATIVE
Glucose, UA: NEGATIVE mg/dL
Ketones, POC UA: NEGATIVE mg/dL
Leukocytes, UA: NEGATIVE
Nitrite, UA: NEGATIVE
Protein Ur, POC: NEGATIVE mg/dL
Specific Gravity, Urine: 1.025
Urobilinogen, Ur: NEGATIVE
pH, UA: 6 (ref 5.0–8.0)

## 2018-03-15 NOTE — Patient Instructions (Addendum)
HEALTH MAINTENANCE RECOMMENDATIONS:  It is recommended that you get at least 30 minutes of aerobic exercise at least 5 days/week (for weight loss, you may need as much as 60-90 minutes). This can be any activity that gets your heart rate up. This can be divided in 10-15 minute intervals if needed, but try and build up your endurance at least once a week.  Weight bearing exercise is also recommended twice weekly.  Eat a healthy diet with lots of vegetables, fruits and fiber.  "Colorful" foods have a lot of vitamins (ie green vegetables, tomatoes, red peppers, etc).  Limit sweet tea, regular sodas and alcoholic beverages, all of which has a lot of calories and sugar.  Up to 1 alcoholic drink daily may be beneficial for women (unless trying to lose weight, watch sugars).  Drink a lot of water.  Calcium recommendations are 1200-1500 mg daily (1500 mg for postmenopausal women or women without ovaries), and vitamin D 1000 IU daily.  This should be obtained from diet and/or supplements (vitamins), and calcium should not be taken all at once, but in divided doses.  Monthly self breast exams and yearly mammograms for women over the age of 29 is recommended.  Sunscreen of at least SPF 30 should be used on all sun-exposed parts of the skin when outside between the hours of 10 am and 4 pm (not just when at beach or pool, but even with exercise, golf, tennis, and yard work!)  Use a sunscreen that says "broad spectrum" so it covers both UVA and UVB rays, and make sure to reapply every 1-2 hours.  Remember to change the batteries in your smoke detectors when changing your clock times in the spring and fall. Please get a carbon monoxide detector for your home.  Use your seat belt every time you are in a car, and please drive safely and not be distracted with cell phones and texting while driving.   Shannon Schultz , Thank you for taking time to come for your Medicare Wellness Visit. I appreciate your ongoing  commitment to your health goals. Please review the following plan we discussed and let me know if I can assist you in the future.   These are the goals we discussed: Goals   None     This is a list of the screening recommended for you and due dates:  Health Maintenance  Topic Date Due  . Flu Shot  01/28/2018  . Tetanus Vaccine  02/22/2018  . Complete foot exam   03/11/2018  . Hemoglobin A1C  09/09/2018  . Eye exam for diabetics  10/28/2018  . Mammogram  10/28/2019  . Colon Cancer Screening  01/23/2023  .  Hepatitis C: One time screening is recommended by Center for Disease Control  (CDC) for  adults born from [birth year] through 1965.   Completed  . Pneumonia vaccines  Completed   We did the foot exam today. Mammograms should be done yearly, next due 09/2008 (not 2021 like it says above). Feel free to ask your pulmonary doctor when your next pneumovax is due.  If you get a flare of the herpes on your hip, take the pill twice daily for 3 days, then back to taking it once daily (Valtrex). I think it was most likely herpes and not shingles that you get on your hip, and the shingles shot may not prevent the flares.  Since nothing happened so far, we can wait and see (stay off the Valtrex until a flare develops).  When the flu shot becomes available, schedule office visit and that way we can also give you the steroid injection for bursitis.   Work on getting exercise in 10-15 minutes intervals, and work on cutting back carbs and losing weight, as we discussed.  I do not think you need a brain MRI.  If you develop headaches, new or recurrent neurologic symptoms, please let us know.

## 2018-03-16 DIAGNOSIS — G43109 Migraine with aura, not intractable, without status migrainosus: Secondary | ICD-10-CM | POA: Insufficient documentation

## 2018-03-17 ENCOUNTER — Encounter: Payer: Self-pay | Admitting: *Deleted

## 2018-03-31 DIAGNOSIS — Z4431 Encounter for fitting and adjustment of external right breast prosthesis: Secondary | ICD-10-CM | POA: Diagnosis not present

## 2018-03-31 DIAGNOSIS — C50111 Malignant neoplasm of central portion of right female breast: Secondary | ICD-10-CM | POA: Diagnosis not present

## 2018-04-01 ENCOUNTER — Ambulatory Visit (INDEPENDENT_AMBULATORY_CARE_PROVIDER_SITE_OTHER): Payer: Medicare HMO | Admitting: Family Medicine

## 2018-04-01 ENCOUNTER — Encounter: Payer: Self-pay | Admitting: Family Medicine

## 2018-04-01 VITALS — BP 136/70 | HR 84 | Ht 63.0 in | Wt 243.4 lb

## 2018-04-01 DIAGNOSIS — Z23 Encounter for immunization: Secondary | ICD-10-CM | POA: Diagnosis not present

## 2018-04-01 DIAGNOSIS — M7061 Trochanteric bursitis, right hip: Secondary | ICD-10-CM | POA: Diagnosis not present

## 2018-04-01 MED ORDER — LIDOCAINE HCL (PF) 1 % IJ SOLN
1.5000 mL | Freq: Once | INTRAMUSCULAR | Status: AC
Start: 2018-04-01 — End: 2018-04-01
  Administered 2018-04-01: 1.5 mL via INTRADERMAL

## 2018-04-01 MED ORDER — TRIAMCINOLONE ACETONIDE 40 MG/ML IJ SUSP
20.0000 mg | Freq: Once | INTRAMUSCULAR | Status: AC
  Administered 2018-04-01: 20 mg via INTRAMUSCULAR

## 2018-04-01 NOTE — Patient Instructions (Signed)
Hip Bursitis Hip bursitis is inflammation of a fluid-filled sac (bursa) in the hip joint. The bursa protects the bones in the hip joint from rubbing against each other. Hip bursitis can cause mild to moderate pain, and symptoms often come and go over time. What are the causes? This condition may be caused by:  Injury to the hip.  Overuse of the muscles that surround the hip joint.  Arthritis or gout.  Diabetes.  Thyroid disease.  Cold weather.  Infection.  In some cases, the cause may not be known. What are the signs or symptoms? Symptoms of this condition may include:  Mild or moderate pain in the hip area. Pain may get worse with movement.  Tenderness and swelling of the hip, especially on the outer side of the hip.  Symptoms may come and go. If the bursa becomes infected, you may have the following symptoms:  Fever.  Red skin and a feeling of warmth in the hip area.  How is this diagnosed? This condition may be diagnosed based on:  A physical exam.  Your medical history.  X-rays.  Removal of fluid from your inflamed bursa for testing (biopsy).  You may be sent to a health care provider who specializes in bone diseases (orthopedist) or a provider who specializes in joint inflammation (rheumatologist). How is this treated? This condition is treated by resting, raising (elevating), and applying pressure(compression) to the injured area. In some cases, this may be enough to make your symptoms go away. Treatment may also include:  Crutches.  Antibiotic medicine.  Draining fluid out of the bursa to help relieve swelling.  Injecting medicine that helps to reduce inflammation (cortisone).  Follow these instructions at home: Medicines  Take over-the-counter and prescription medicines only as told by your health care provider.  Do not drive or operate heavy machinery while taking prescription pain medicine, or as told by your health care provider.  If you were  prescribed an antibiotic, take it as told by your health care provider. Do not stop taking the antibiotic even if you start to feel better. Activity  Return to your normal activities as told by your health care provider. Ask your health care provider what activities are safe for you.  Rest and protect your hip as much as possible until your pain and swelling get better. General instructions  Wear compression wraps only as told by your health care provider.  Elevate your hip above the level of your heart as much as you can without pain. To do this, try putting a pillow under your hips while you lie down.  Do not use your hip to support your body weight until your health care provider says that you can. Use crutches as told by your health care provider.  Gently massage and stretch your injured area as often as is comfortable.  Keep all follow-up visits as told by your health care provider. This is important. How is this prevented?  Exercise regularly, as told by your health care provider.  Warm up and stretch before being active.  Cool down and stretch after being active.  If an activity irritates your hip or causes pain, avoid the activity as much as possible.  Avoid sitting down for long periods at a time. Contact a health care provider if:  You have a fever.  You develop new symptoms.  You have difficulty walking or doing everyday activities.  You have pain that gets worse or does not get better with medicine.  You   develop red skin or a feeling of warmth in your hip area. Get help right away if:  You cannot move your hip.  You have severe pain. This information is not intended to replace advice given to you by your health care provider. Make sure you discuss any questions you have with your health care provider. Document Released: 12/06/2001 Document Revised: 11/22/2015 Document Reviewed: 01/16/2015 Elsevier Interactive Patient Education  2018 Elsevier Inc.  

## 2018-04-01 NOTE — Progress Notes (Signed)
Chief Complaint  Patient presents with  . Hip Pain    right hip pain, would like a injection.    Patient presents for cortisone shot to treat her hip bursitis. Both hips hurt, but she wants to have the injection on the right, since that is the side she sleeps on.  She hasn't been able to sleep in her bed due to the discomfort, sleeping in recliner (no longer related to her shoulder pain).  Risks and alternatives to procedure were reviewed. Verbal consent obtained  PHYSICAL EXAM:  BP 136/70   Pulse 84   Ht 5\' 3"  (1.6 m)   Wt 243 lb 6.4 oz (110.4 kg)   LMP  (LMP Unknown)   BMI 43.12 kg/m   Tender over right trochanteric bursa Procedure Area was cleansed with alcohol. Ethyl chloride spray was applied, and trochanteric bursa was injectd with 20mg  of kenalog and 1.5cc of 1% lidocaine without epi. She tolerated the procedure well, and had improvement in her pain.  ASSESSMENT/PLAN:  Trochanteric bursitis of right hip - Plan: PR DRAIN/INJECT INTERMEDIATE JOINT/BURSA  Need for influenza vaccination - Plan: Flu vaccine HIGH DOSE PF (Fluzone High dose), triamcinolone acetonide (KENALOG-40) injection 20 mg, lidocaine (PF) (XYLOCAINE) 1 % injection 1.5 mL

## 2018-04-04 DIAGNOSIS — J441 Chronic obstructive pulmonary disease with (acute) exacerbation: Secondary | ICD-10-CM | POA: Diagnosis not present

## 2018-04-04 DIAGNOSIS — J449 Chronic obstructive pulmonary disease, unspecified: Secondary | ICD-10-CM | POA: Diagnosis not present

## 2018-04-13 ENCOUNTER — Other Ambulatory Visit: Payer: Self-pay | Admitting: Family Medicine

## 2018-05-05 DIAGNOSIS — J449 Chronic obstructive pulmonary disease, unspecified: Secondary | ICD-10-CM | POA: Diagnosis not present

## 2018-05-05 DIAGNOSIS — J441 Chronic obstructive pulmonary disease with (acute) exacerbation: Secondary | ICD-10-CM | POA: Diagnosis not present

## 2018-05-12 ENCOUNTER — Telehealth: Payer: Self-pay | Admitting: Pulmonary Disease

## 2018-05-12 NOTE — Telephone Encounter (Signed)
Called and spoke with patient, she stated that she was out of the Trelegy inhaler but she does have a few symbicort inhalers left. She is wanting to know if she can use these.  Dr. Vaughan Browner please advise, thank you.

## 2018-05-13 NOTE — Telephone Encounter (Signed)
Called and spoke to patient, made aware of PM recommendations, patient states she has plenty of symbicort to use. Voiced understanding and expressed thanks. Nothing further is needed at this time.   PM we do not have any spiriva samples at this time. Patient is aware to use her symbicort.

## 2018-05-13 NOTE — Telephone Encounter (Signed)
Ok to use symbicort if we cannot get trelegy. Can you check if we have samples of spiriva to use with symbicort. Thanks

## 2018-05-19 DIAGNOSIS — Z8551 Personal history of malignant neoplasm of bladder: Secondary | ICD-10-CM | POA: Diagnosis not present

## 2018-06-01 ENCOUNTER — Ambulatory Visit: Payer: Medicare HMO | Admitting: Cardiology

## 2018-06-01 ENCOUNTER — Encounter: Payer: Self-pay | Admitting: Cardiology

## 2018-06-01 VITALS — BP 132/70 | HR 93 | Ht 63.0 in | Wt 244.1 lb

## 2018-06-01 DIAGNOSIS — J449 Chronic obstructive pulmonary disease, unspecified: Secondary | ICD-10-CM | POA: Diagnosis not present

## 2018-06-01 DIAGNOSIS — I350 Nonrheumatic aortic (valve) stenosis: Secondary | ICD-10-CM | POA: Diagnosis not present

## 2018-06-01 DIAGNOSIS — E7800 Pure hypercholesterolemia, unspecified: Secondary | ICD-10-CM | POA: Diagnosis not present

## 2018-06-01 DIAGNOSIS — I10 Essential (primary) hypertension: Secondary | ICD-10-CM

## 2018-06-01 DIAGNOSIS — Z6841 Body Mass Index (BMI) 40.0 and over, adult: Secondary | ICD-10-CM | POA: Diagnosis not present

## 2018-06-01 NOTE — Patient Instructions (Signed)
Medication Instructions:  The current medical regimen is effective;  continue present plan and medications.  If you need a refill on your cardiac medications before your next appointment, please call your pharmacy.   Testing/Procedures: Your physician has requested that you have an echocardiogram. Echocardiography is a painless test that uses sound waves to create images of your heart. It provides your doctor with information about the size and shape of your heart and how well your heart's chambers and valves are working. This procedure takes approximately one hour. There are no restrictions for this procedure.  Follow-Up: At [facility], you and your health needs are our priority.  As part of our continuing mission to provide you with exceptional heart care, we have created designated Provider Care Teams.  These Care Teams include your primary Cardiologist (physician) and Advanced Practice Providers (APPs -  Physician Assistants and Nurse Practitioners) who all work together to provide you with the care you need, when you need it. You will need a follow up appointment in 12 months.  Please call our office 2 months in advance to schedule this appointment.  You may see Mark Skains, MD or one of the following Advanced Practice Providers on your designated Care Team:   Lori Gerhardt, NP Laura Ingold, NP . Jill McDaniel, NP  Thank you for choosing [facility]!!     

## 2018-06-01 NOTE — Progress Notes (Signed)
Cardiology Office Note:    Date:  06/01/2018   ID:  AMEISHA MCCLELLAN, DOB 1946/04/01, MRN [id number]  PCP:  [name], MD  Cardiologist:  Candee Furbish, MD  Electrophysiologist:  None   Referring MD: [name], MD     History of Present Illness:    Shannon Schultz is a 72 y.o. female here for follow-up of aortic stenosis COPD.  Shortness of breath.  Prior FEV1 52%.  Father died 30 MI mother died 32.  Ventral hernia repaired 2018.  Past Medical History:  Diagnosis Date  . Aortic stenosis    mild-mod by 04/08/16 echo; mod AS 04/2017  . Arthritis    "hands" (05/15/2017)  . Bladder cancer ([place]) 2018  . Breast cancer, right ([place]) 1992   DCIS,bladder ca (just dx)  . Colon polyp   . Complication of anesthesia 1992   "local anesthesia" used was hard to awaken from-no problems since (05/15/2017)  . COPD (chronic obstructive pulmonary disease) ([place])   . Diverticulosis   . Dyspnea   . Elevated cholesterol   . Elevated liver enzymes    fatty liver per ultrasound per pt  . Environmental allergies    "I have allergies year round" (05/15/2017)  . Family history of malignant neoplasm of breast   . FHx: BRCA2 gene positive    sister with BRCA2 mutation (pt tested NEGATIVE)  . Genital HSV    gets on hip  . Heart murmur     echo 05/2011 mild aortic stenosis  . HSV (herpes simplex virus) infection    on hip--on daily suppression  . Hypertension   . Hypothyroidism    took med 7 yrs after birth of 1st child  . Impaired glucose tolerance   . Migraine   . On home oxygen therapy    "have it available but I'm not using it" (05/15/2017)  . Osteopenia   . Pneumonia ~ 1950; 08/06/2010  . Type 2 diabetes mellitus ([place])   . Ventral hernia   . Vitamin D deficiency disease     Past Surgical History:  Procedure Laterality Date  . ABDOMINAL HERNIA REPAIR  05/15/2017  . BREAST BIOPSY Right 1992  . BREAST BIOPSY Left 2018  . BREAST EXCISIONAL BIOPSY Left 2018   ATYPICAL DUCTAL HYPERPLASIA  INVOLVING A COMPLEX  . BREAST LUMPECTOMY WITH RADIOACTIVE SEED LOCALIZATION Left 12/22/2016   Procedure: LEFT BREAST LUMPECTOMY WITH RADIOACTIVE SEED LOCALIZATION;  Surgeon: Jovita Kussmaul, MD;  Location: [facility];  Service: General;  Laterality: Left;  . COLONOSCOPY  2009, 08/2010, 12/2015   Dr. Collene Mares; "only 1 had any polyps" (05/15/2017)  . CYSTOSCOPY  04/23/2017  . CYSTOSCOPY W/ RETROGRADES Bilateral 01/12/2017   Procedure: CYSTOSCOPY WITH RETROGRADE PYELOGRAM/ EXAM UNDER ANESTHESIA;  Surgeon: Raynelle Bring, MD;  Location: [facility];  Service: Urology;  Laterality: Bilateral;  . HERNIA REPAIR    . INSERTION OF MESH N/A 05/15/2017   Procedure: INSERTION OF MESH;  Surgeon: Jovita Kussmaul, MD;  Location: [facility];  Service: General;  Laterality: N/A;  . LAPAROSCOPIC CHOLECYSTECTOMY  2003  . MASTECTOMY Right 1992  . TRANSURETHRAL RESECTION OF BLADDER TUMOR WITH MITOMYCIN-C N/A 01/12/2017   Procedure: TRANSURETHRAL RESECTION OF BLADDER TUMOR WITH POSSIBLE POST OPERATIVE INSTILLATION OF MITOMYCIN-C;  Surgeon: Raynelle Bring, MD;  Location: [facility];  Service: Urology;  Laterality: N/A;  . TYMPANOSTOMY TUBE PLACEMENT Bilateral 1980s  . UMBILICAL HERNIA REPAIR  2003  . VENTRAL HERNIA REPAIR N/A 05/15/2017   Procedure: VENTRAL HERNIA REPAIR  WITH MESH;  Surgeon: Jovita Kussmaul, MD;  Location[facility];  Service: General;  Laterality: N/A;    Current Medications: Current Meds  Medication Sig  . acetaminophen (TYLENOL) 500 MG tablet Take 1,000 mg every 6 (six) hours as needed by mouth for moderate pain or headache.   . albuterol (PROVENTIL HFA;VENTOLIN HFA) 108 (90 Base) MCG/ACT inhaler Inhale 2 puffs every 6 (six) hours as needed into the lungs for wheezing or shortness of breath.  Marland Kitchen albuterol (PROVENTIL) (2.5 MG/3ML) 0.083% nebulizer solution USE 1 VIAL BY NEBULIZATION EVERY FOUR HOURS AS NEEDED FOR WHEEZING OR SHORTNESS OF BREATH.  Marland Kitchen aspirin EC 81 MG tablet Take 81 mg daily by mouth.  .  bisoprolol-hydrochlorothiazide (ZIAC) 10-6.25 MG tablet TAKE 1 TABLET EVERY DAY  . Calcium Carbonate-Vitamin D (CALCIUM 600+D) 600-400 MG-UNIT per tablet Take 1 tablet by mouth daily.   . cetirizine (KLS ALLER-TEC) 10 MG tablet Take 10 mg by mouth daily.  . Cholecalciferol (EQL VITAMIN D3) 2000 units CAPS Take 4,000 Units daily by mouth.  . fluticasone (FLONASE) 50 MCG/ACT nasal spray USE 2 SPRAYS IN EACH NOSTRIL EVERY DAY  . furosemide (LASIX) 20 MG tablet TAKE 1 TABLET (20 MG TOTAL) BY MOUTH DAILY AS NEEDED FOR FLUID OR EDEMA.  . metFORMIN (GLUCOPHAGE-XR) 500 MG 24 hr tablet TAKE 1 TABLET THREE TIMES DAILY  . Multiple Vitamins-Minerals (CENTRUM SILVER PO) Take 1 tablet by mouth daily.   . simvastatin (ZOCOR) 20 MG tablet TAKE 1 TABLET AT BEDTIME  . TRELEGY ELLIPTA 100-62.5-25 MCG/INH AEPB INHALE 1 PUFF INTO THE LUNGS EVERY DAY  . TRUEPLUS LANCETS 33G MISC TEST ONE TIME DAILY     Allergies:   Contrast media [iodinated diagnostic agents]; Iohexol; Lisinopril; Sulfa antibiotics; Latex; Codeine; Levofloxacin; Lipitor [atorvastatin calcium]; and Vicodin [hydrocodone-acetaminophen]   Social History   Socioeconomic History  . Marital status: Married    Spouse name: Not on file  . Number of children: 3  . Years of education: Not on file  . Highest education level: Not on file  Occupational History  . Occupation: Retired  Scientific laboratory technician  . Financial resource strain: Not on file  . Food insecurity:    Worry: Not on file    Inability: Not on file  . Transportation needs:    Medical: Not on file    Non-medical: Not on file  Tobacco Use  . Smoking status: Former Smoker    Packs/day: 1.00    Years: 46.00    Pack years: 46.00    Types: Cigarettes    Last attempt to quit: 05/31/2011    Years since quitting: 7.0  . Smokeless tobacco: Never Used  Substance and Sexual Activity  . Alcohol use: No    Comment: very rare, <1/year  . Drug use: No  . Sexual activity: Yes    Partners: Male     Birth control/protection: Post-menopausal  Lifestyle  . Physical activity:    Days per week: Not on file    Minutes per session: Not on file  . Stress: Not on file  Relationships  . Social connections:    Talks on phone: Not on file    Gets together: Not on file    Attends religious service: Not on file    Active member of club or organization: Not on file    Attends meetings of clubs or organizations: Not on file    Relationship status: Not on file  Other Topics Concern  . Not on file  Social  History Narrative   Lives with her husband.  Children all live in [place] nearby. No pets. 6 grandchildren.     Family History: The patient's family history includes Allergies in her sister and sister; Asthma in her sister and sister; Breast cancer (age of onset: 63) in her sister; Cancer in her maternal grandmother and other; Diabetes in her father; Fibromyalgia in her sister and sister; Hashimoto's thyroiditis in her sister and sister; Heart disease in her father and mother; Hyperlipidemia in her sister and sister; Hypertension in her father; Stroke (age of onset: 86) in her paternal grandmother; Tuberculosis in her maternal grandfather.  ROS:   Please see the history of present illness.     All other systems reviewed and are negative.  EKGs/Labs/Other Studies Reviewed:    The following studies were reviewed today:  ECHO 05/04/17 - Left ventricle: The cavity size was normal. Systolic function was   normal. The estimated ejection fraction was in the range of 60%   to 65%. Wall motion was normal; there were no regional wall   motion abnormalities. There was an increased relative   contribution of atrial contraction to ventricular filling.   Doppler parameters are consistent with abnormal left ventricular   relaxation (grade 1 diastolic dysfunction). Doppler parameters   are consistent with high ventricular filling pressure. - Aortic valve: Valve mobility was restricted. There was moderate    stenosis. Mean gradient (S): 22 mm Hg. Valve area (VTI): 0.94   cm^2. Valve area (Vmax): 0.8 cm^2. Valve area (Vmean): 0.83 cm^2. - Mitral valve: Calcified annulus. There was trivial regurgitation. - Pulmonic valve: There was trivial regurgitation.  Impressions:  - No significant change in degree of AS from prior study. Mean AV   gradient has increased slightly (20>22mHg).  NUC stress 03/22/15:   Nuclear stress EF: 71%.   There was no ST segment deviation noted during stress.   The study is normal. No evidence of ischemia. No evidence of infarction .   This is a low risk study.   EKG:  EKG is  ordered today.  The ekg ordered today demonstrates normal sinus rhythm 93 septal infarct pattern.  Recent Labs: 09/29/2017: B Natriuretic Peptide 75.1 10/01/2017: Hemoglobin 13.0; Magnesium 2.2; Platelets 136 03/11/2018: ALT 25; BUN 14; Creatinine, Ser 0.72; Potassium 4.7; Sodium 144; TSH 2.970  Recent Lipid Panel    Component Value Date/Time   CHOL 108 09/14/2017 0846   TRIG 96 09/14/2017 0846   HDL 51 09/14/2017 0846   CHOLHDL 2.1 09/14/2017 0846   CHOLHDL 2.2 08/18/2016 1346   VLDL 19 08/18/2016 1346   LDLCALC 38 09/14/2017 0846    Physical Exam:    VS:  BP 132/70   Pulse 93   Ht '5\' 3"'  (1.6 m)   Wt 244 lb 1.9 oz (110.7 kg)   LMP  (LMP Unknown)   BMI 43.24 kg/m     Wt Readings from Last 3 Encounters:  06/01/18 244 lb 1.9 oz (110.7 kg)  04/01/18 243 lb 6.4 oz (110.4 kg)  03/15/18 243 lb 9.6 oz (110.5 kg)     GEN:  Well nourished, well developed in no acute distress HEENT: Normal NECK: No JVD; No carotid bruits LYMPHATICS: No lymphadenopathy CARDIAC: RRR, no murmurs, rubs, gallops RESPIRATORY:  Clear to auscultation without rales, wheezing or rhonchi  ABDOMEN: Soft, non-tender, non-distended MUSCULOSKELETAL:  No edema; No deformity  SKIN: Warm and dry NEUROLOGIC:  Alert and oriented x 3 PSYCHIATRIC:  Normal affect   ASSESSMENT:  1. Aortic valve stenosis,  etiology of cardiac valve disease unspecified   2. Essential hypertension, benign   3. Morbid obesity ([place])   4. Pure hypercholesterolemia    PLAN:    In order of problems listed above:  Dyspnea on exertion - Nuclear stress test echocardiogram overall reassuring.  This is from moderate COPD FEV1 of 52%.  Continue with aggressive management.  Aortic stenosis -Now moderate.  Continuing to monitor.  We will check another echocardiogram.  Prior mean gradient 22 mmHg.  We discussed today the implications of aortic valve replacement, methods.  COPD -Inhalers noted.  Morbid obesity -Continue to encourage weight loss.  BMI 43.  We discussed today decreasing carbohydrates.   Medication Adjustments/Labs and Tests Ordered: Current medicines are reviewed at length with the patient today.  Concerns regarding medicines are outlined above.  Orders Placed This Encounter  Procedures  . EKG 12-Lead  . ECHOCARDIOGRAM COMPLETE   No orders of the defined types were placed in this encounter.   Patient Instructions  Medication Instructions:  The current medical regimen is effective;  continue present plan and medications.  If you need a refill on your cardiac medications before your next appointment, please call your pharmacy.   Testing/Procedures: Your physician has requested that you have an echocardiogram. Echocardiography is a painless test that uses sound waves to create images of your heart. It provides your doctor with information about the size and shape of your heart and how well your heart's chambers and valves are working. This procedure takes approximately one hour. There are no restrictions for this procedure.  Follow-Up: At [facility], you and your health needs are our priority.  As part of our continuing mission to provide you with exceptional heart care, we have created designated Provider Care Teams.  These Care Teams include your primary Cardiologist (physician) and Advanced  Practice Providers (APPs -  Physician Assistants and Nurse Practitioners) who all work together to provide you with the care you need, when you need it. You will need a follow up appointment in 12 months.  Please call our office 2 months in advance to schedule this appointment.  You may see Candee Furbish, MD or one of the following Advanced Practice Providers on your designated Care Team:   Truitt Merle, NP Cecilie Kicks, NP . Kathyrn Drown, NP  Thank you for choosing [facility]!!        Signed, Candee Furbish, MD  06/01/2018 4:51 PM    [place]

## 2018-06-02 DIAGNOSIS — Z4431 Encounter for fitting and adjustment of external right breast prosthesis: Secondary | ICD-10-CM | POA: Diagnosis not present

## 2018-06-02 DIAGNOSIS — C50111 Malignant neoplasm of central portion of right female breast: Secondary | ICD-10-CM | POA: Diagnosis not present

## 2018-06-04 DIAGNOSIS — J449 Chronic obstructive pulmonary disease, unspecified: Secondary | ICD-10-CM | POA: Diagnosis not present

## 2018-06-04 DIAGNOSIS — J441 Chronic obstructive pulmonary disease with (acute) exacerbation: Secondary | ICD-10-CM | POA: Diagnosis not present

## 2018-06-09 ENCOUNTER — Other Ambulatory Visit: Payer: Self-pay

## 2018-06-09 ENCOUNTER — Ambulatory Visit (HOSPITAL_COMMUNITY): Payer: Medicare HMO | Attending: Cardiology

## 2018-06-09 DIAGNOSIS — I350 Nonrheumatic aortic (valve) stenosis: Secondary | ICD-10-CM | POA: Insufficient documentation

## 2018-06-25 ENCOUNTER — Ambulatory Visit: Payer: Medicare HMO | Admitting: Nurse Practitioner

## 2018-06-25 ENCOUNTER — Encounter: Payer: Self-pay | Admitting: Nurse Practitioner

## 2018-06-25 VITALS — BP 130/76 | HR 94 | Temp 98.3°F | Ht 64.5 in | Wt 247.6 lb

## 2018-06-25 DIAGNOSIS — J329 Chronic sinusitis, unspecified: Secondary | ICD-10-CM

## 2018-06-25 DIAGNOSIS — J441 Chronic obstructive pulmonary disease with (acute) exacerbation: Secondary | ICD-10-CM | POA: Diagnosis not present

## 2018-06-25 MED ORDER — AZITHROMYCIN 250 MG PO TABS: ORAL_TABLET | ORAL | 0 refills | Status: DC

## 2018-06-25 NOTE — Assessment & Plan Note (Signed)
Patient Instructions  Will give samples of mucinex and delsym Will order azithromycin  Continue current medications  General instructions: Hydrate  Drink enough water to keep your pee (urine) clear or pale yellow.  Warm tea with honey to help throat irritation Rest  Rest as much as possible.  Sleep with your head raised (elevated).  Make sure to get enough sleep each night. General instructions  Put a warm, moist washcloth on your face 3-4 times a day or as told by your doctor. This will help with discomfort.  Wash your hands often with soap and water. If there is no soap and water, use hand sanitizer.  Do not smoke. Avoid being around people who are smoking (secondhand smoke). Call the office if:  You have a fever.  Your symptoms get worse.  Your symptoms do not get better within 10 days.  Follow up with Dr. Rolla Etienne as already scheduled or sooner if symptoms worsen

## 2018-06-25 NOTE — Patient Instructions (Addendum)
Will give samples of mucinex and delsym Will order azithromycin  Continue current medications  General instructions: Hydrate  Drink enough water to keep your pee (urine) clear or pale yellow.  Warm tea with honey to help throat irritation Rest  Rest as much as possible.  Sleep with your head raised (elevated).  Make sure to get enough sleep each night. General instructions  Put a warm, moist washcloth on your face 3-4 times a day or as told by your doctor. This will help with discomfort.  Wash your hands often with soap and water. If there is no soap and water, use hand sanitizer.  Do not smoke. Avoid being around people who are smoking (secondhand smoke). Call the office if:  You have a fever.  Your symptoms get worse.  Your symptoms do not get better within 10 days.  Follow up with Dr. Rolla Etienne as already scheduled or sooner if symptoms worsen

## 2018-06-25 NOTE — Progress Notes (Signed)
'@Patient'  ID: Shannon Schultz, female    DOB: 09/06/1945, 72 y.o.   MRN: [id number]  Chief Complaint  Patient presents with  . Cough    Referring provider: [name], MD  HPI 72 year old female with COPD followed by Dr. Vaughan Browner  Tests: Imaging Screening CT chest 09/10/16 -moderate emphysema Screening CT 09/16/17-moderate emphysema, scattered subcentimeter pulmonary nodules.  Fatty liver with findings of cirrhosis. Chest x-ray 09/29/17-no active cardio pulmonary disease   PFTs 08/28/11 FVC 2.71 [92%], FEV1 1.33 (62%), F/F 49, TLC 114%, DLCO 46% Moderate obstructive defect with moderate reduction in diffusion capacity. No bronchodilator response  11/08/15 FVC 2.05 [7%), FEV1 1.23 (55%), F/F 60 Moderate obstructive defect  09/24/16 FVC 2. (82%), FEV1 1.43 (62%), F/F 58 , TLC 99%, DLCO 54% Moderate obstruction with moderate diffusion defect. No bronchodilator response.  Labs A1AT 08/08/16- 162, PIMM CBC 10/01/2017-WBC 5.1, eos 0%   OV 06/25/18 - Cough Presents today with cough.  She states cough has been productive of yellow sputum.  Cough started yesterday.  Symptoms have progressively worsened.  Patient reports she has been having sinus pressure and pain for the past 2 weeks with bloody sinus drainage noted.  She complains today of a sore throat.  Patient is afebrile in office today.  She denies any shortness of breath, chest pain, or edema.  She is compliant with Trelegy.    Allergies  Allergen Reactions  . Contrast Media [Iodinated Diagnostic Agents] Anaphylaxis, Shortness Of Breath and Other (See Comments)    Could not breath  . Iohexol Anaphylaxis, Shortness Of Breath and Other (See Comments)    Immediately could not breathe  . Lisinopril Anaphylaxis, Shortness Of Breath and Rash  . Sulfa Antibiotics Anaphylaxis and Other (See Comments)    Historical from mother, pt states that mother says she almost died from this drug  . Latex Other (See Comments)    Unless against  on skin for a long time, blisters. Short term is okay.  . Codeine Nausea Only, Anxiety and Other (See Comments)    insomnia  . Levofloxacin Other (See Comments)    insomnia  . Lipitor [Atorvastatin Calcium] Rash  . Vicodin [Hydrocodone-Acetaminophen] Itching    Immunization History  Administered Date(s) Administered  . Influenza Split 05/28/2011  . Influenza, High Dose Seasonal PF 04/07/2013, 05/15/2014, 05/30/2015, 03/25/2016, 03/05/2017, 04/01/2018  . Influenza, Seasonal, Injecte, Preservative Fre 06/07/2012  . Pneumococcal Conjugate-13 07/06/2014  . Pneumococcal Polysaccharide-23 12/15/2004, 05/28/2011  . Tdap 02/23/2008, 06/15/2018  . Zoster 05/17/2010  . Zoster Recombinat (Shingrix) 12/02/2017, 02/06/2018    Past Medical History:  Diagnosis Date  . Aortic stenosis    mild-mod by 04/08/16 echo; mod AS 04/2017  . Arthritis    "hands" (05/15/2017)  . Bladder cancer ([place]) 2018  . Breast cancer, right ([place]) 1992   DCIS,bladder ca (just dx)  . Colon polyp   . Complication of anesthesia 1992   "local anesthesia" used was hard to awaken from-no problems since (05/15/2017)  . COPD (chronic obstructive pulmonary disease) ([place])   . Diverticulosis   . Dyspnea   . Elevated cholesterol   . Elevated liver enzymes    fatty liver per ultrasound per pt  . Environmental allergies    "I have allergies year round" (05/15/2017)  . Family history of malignant neoplasm of breast   . FHx: BRCA2 gene positive    sister with BRCA2 mutation (pt tested NEGATIVE)  . Genital HSV    gets on hip  . Heart murmur  echo 05/2011 mild aortic stenosis  . HSV (herpes simplex virus) infection    on hip--on daily suppression  . Hypertension   . Hypothyroidism    took med 7 yrs after birth of 1st child  . Impaired glucose tolerance   . Migraine   . On home oxygen therapy    "have it available but I'm not using it" (05/15/2017)  . Osteopenia   . Pneumonia ~ 1950; 08/06/2010  . Type 2 diabetes  mellitus ([place])   . Ventral hernia   . Vitamin D deficiency disease     Tobacco History: Social History   Tobacco Use  Smoking Status Former Smoker  . Packs/day: 1.00  . Years: 46.00  . Pack years: 46.00  . Types: Cigarettes  . Last attempt to quit: 05/31/2011  . Years since quitting: 7.0  Smokeless Tobacco Never Used   Counseling given: Yes   Outpatient Encounter Medications as of 06/25/2018  Medication Sig  . acetaminophen (TYLENOL) 500 MG tablet Take 1,000 mg every 6 (six) hours as needed by mouth for moderate pain or headache.   . albuterol (PROVENTIL HFA;VENTOLIN HFA) 108 (90 Base) MCG/ACT inhaler Inhale 2 puffs every 6 (six) hours as needed into the lungs for wheezing or shortness of breath.  Marland Kitchen albuterol (PROVENTIL) (2.5 MG/3ML) 0.083% nebulizer solution USE 1 VIAL BY NEBULIZATION EVERY FOUR HOURS AS NEEDED FOR WHEEZING OR SHORTNESS OF BREATH.  Marland Kitchen aspirin EC 81 MG tablet Take 81 mg daily by mouth.  . bisoprolol-hydrochlorothiazide (ZIAC) 10-6.25 MG tablet TAKE 1 TABLET EVERY DAY  . Calcium Carbonate-Vitamin D (CALCIUM 600+D) 600-400 MG-UNIT per tablet Take 1 tablet by mouth daily.   . cetirizine (KLS ALLER-TEC) 10 MG tablet Take 10 mg by mouth daily.  . Cholecalciferol (EQL VITAMIN D3) 2000 units CAPS Take 4,000 Units daily by mouth.  . fluticasone (FLONASE) 50 MCG/ACT nasal spray USE 2 SPRAYS IN EACH NOSTRIL EVERY DAY  . furosemide (LASIX) 20 MG tablet TAKE 1 TABLET (20 MG TOTAL) BY MOUTH DAILY AS NEEDED FOR FLUID OR EDEMA.  . metFORMIN (GLUCOPHAGE-XR) 500 MG 24 hr tablet TAKE 1 TABLET THREE TIMES DAILY  . Multiple Vitamins-Minerals (CENTRUM SILVER PO) Take 1 tablet by mouth daily.   . simvastatin (ZOCOR) 20 MG tablet TAKE 1 TABLET AT BEDTIME  . TRELEGY ELLIPTA 100-62.5-25 MCG/INH AEPB INHALE 1 PUFF INTO THE LUNGS EVERY DAY  . TRUEPLUS LANCETS 33G MISC TEST ONE TIME DAILY  . azithromycin (ZITHROMAX) 250 MG tablet Take 2 tablets (500 mg) on day 1, then take 1 tablet (250 mg)  on days 2-5   No facility-administered encounter medications on file as of 06/25/2018.      Review of Systems  Review of Systems  Constitutional: Negative.  Negative for chills and fever.  HENT: Positive for congestion, nosebleeds, postnasal drip, sinus pressure, sinus pain, sore throat and voice change.   Respiratory: Positive for cough and shortness of breath.   Cardiovascular: Negative.  Negative for chest pain, palpitations and leg swelling.  Gastrointestinal: Negative.   Allergic/Immunologic: Negative.   Neurological: Negative.   Psychiatric/Behavioral: Negative.        Physical Exam  BP 130/76 (BP Location: Left Arm, Patient Position: Sitting, Cuff Size: Normal)   Pulse 94   Temp 98.3 F (36.8 C)   Ht 5' 4.5" (1.638 m)   Wt 247 lb 9.6 oz (112.3 kg)   LMP  (LMP Unknown)   SpO2 95%   BMI 41.84 kg/m   Wt Readings from Last  5 Encounters:  06/25/18 247 lb 9.6 oz (112.3 kg)  06/01/18 244 lb 1.9 oz (110.7 kg)  04/01/18 243 lb 6.4 oz (110.4 kg)  03/15/18 243 lb 9.6 oz (110.5 kg)  02/25/18 245 lb (111.1 kg)     Physical Exam Vitals signs and nursing note reviewed.  Constitutional:      General: She is not in acute distress.    Appearance: She is well-developed.  Cardiovascular:     Rate and Rhythm: Normal rate and regular rhythm.  Pulmonary:     Effort: Pulmonary effort is normal. No respiratory distress.     Breath sounds: Normal breath sounds. No rhonchi.  Musculoskeletal:        General: No swelling.  Neurological:     Mental Status: She is alert and oriented to person, place, and time.       Assessment & Plan:   COPD with acute exacerbation [facility]) Patient Instructions  Will give samples of mucinex and delsym Will order azithromycin  Continue current medications  General instructions: Hydrate  Drink enough water to keep your pee (urine) clear or pale yellow.  Warm tea with honey to help throat irritation Rest  Rest as much as  possible.  Sleep with your head raised (elevated).  Make sure to get enough sleep each night. General instructions  Put a warm, moist washcloth on your face 3-4 times a day or as told by your doctor. This will help with discomfort.  Wash your hands often with soap and water. If there is no soap and water, use hand sanitizer.  Do not smoke. Avoid being around people who are smoking (secondhand smoke). Call the office if:  You have a fever.  Your symptoms get worse.  Your symptoms do not get better within 10 days.  Follow up with Dr. Rolla Etienne as already scheduled or sooner if symptoms worsen     Sinusitis Patient Instructions  Will give samples of mucinex and delsym Will order azithromycin  Continue current medications  General instructions: Hydrate  Drink enough water to keep your pee (urine) clear or pale yellow.  Warm tea with honey to help throat irritation Rest  Rest as much as possible.  Sleep with your head raised (elevated).  Make sure to get enough sleep each night. General instructions  Put a warm, moist washcloth on your face 3-4 times a day or as told by your doctor. This will help with discomfort.  Wash your hands often with soap and water. If there is no soap and water, use hand sanitizer.  Do not smoke. Avoid being around people who are smoking (secondhand smoke). Call the office if:  You have a fever.  Your symptoms get worse.  Your symptoms do not get better within 10 days.  Follow up with Dr. Rolla Etienne as already scheduled or sooner if symptoms worsen        Fenton Foy, NP 06/25/2018

## 2018-07-05 DIAGNOSIS — J449 Chronic obstructive pulmonary disease, unspecified: Secondary | ICD-10-CM | POA: Diagnosis not present

## 2018-07-05 DIAGNOSIS — J441 Chronic obstructive pulmonary disease with (acute) exacerbation: Secondary | ICD-10-CM | POA: Diagnosis not present

## 2018-07-14 ENCOUNTER — Encounter: Payer: Self-pay | Admitting: Pulmonary Disease

## 2018-07-14 ENCOUNTER — Ambulatory Visit: Payer: Medicare HMO | Admitting: Pulmonary Disease

## 2018-07-14 VITALS — BP 126/80 | HR 85 | Ht 64.0 in | Wt 247.0 lb

## 2018-07-14 DIAGNOSIS — J449 Chronic obstructive pulmonary disease, unspecified: Secondary | ICD-10-CM

## 2018-07-14 MED ORDER — UMECLIDINIUM-VILANTEROL 62.5-25 MCG/INH IN AEPB: 1.0000 | INHALATION_SPRAY | Freq: Every day | RESPIRATORY_TRACT | 0 refills | Status: DC

## 2018-07-14 MED ORDER — UMECLIDINIUM-VILANTEROL 62.5-25 MCG/INH IN AEPB: 1.0000 | INHALATION_SPRAY | Freq: Every day | RESPIRATORY_TRACT | 2 refills | Status: DC

## 2018-07-14 NOTE — Progress Notes (Addendum)
Shannon Schultz    [id number]    1945/07/01  Primary Care Physician:Knapp, Tera Helper, MD  Referring Physician: Rita Schultz, [place], [place] [address]  Chief complaint:  Follow up for COPD GOLD D  HPI: Shannon Schultz is a 73 year old with COPD, moderate obstructive defect. She was previously seen by Shannon Schultz in 2013. She has been maintained on Symbicort since 2013. She was hospitalized in February 2017 for the flu and COPD exacerbation. Switched to trelegy inhaler in 2018 with good response.   She has had an eventful year in 2018.  She lost her sister to breast cancer in early 2018, was diagnosed with breast cancer s/p lumpectomy, had bladder cancer and underwent resection and intravesicular chemo, fell and broke her right shoulder, underwent ventral hernia repair with mesh insertion.  Hospitalized for 2 days in April 2019 with COPD exacerbation.  Treated with IV steroids, nebs, Zithromax. Noted to have multiple bilateral lower extremity edema and given Lasix  Interim History: Previously maintained on Trelegy.  She had to stop it when she went into the donut hole and used Symbicort samples.  She feels that her breathing has worsened since then with increasing dyspnea, chest congestion.  Outpatient Encounter Medications as of 07/14/2018  Medication Sig  . acetaminophen (TYLENOL) 500 MG tablet Take 1,000 mg every 6 (six) hours as needed by mouth for moderate pain or headache.   . albuterol (PROVENTIL HFA;VENTOLIN HFA) 108 (90 Base) MCG/ACT inhaler Inhale 2 puffs every 6 (six) hours as needed into the lungs for wheezing or shortness of breath.  Marland Kitchen albuterol (PROVENTIL) (2.5 MG/3ML) 0.083% nebulizer solution USE 1 VIAL BY NEBULIZATION EVERY FOUR HOURS AS NEEDED FOR WHEEZING OR SHORTNESS OF BREATH.  Marland Kitchen aspirin EC 81 MG tablet Take 81 mg daily by mouth.  Marland Kitchen azithromycin (ZITHROMAX) 250 MG tablet Take 2 tablets (500 mg) on day 1, then take 1 tablet (250 mg) on days 2-5  .  bisoprolol-hydrochlorothiazide (ZIAC) 10-6.25 MG tablet TAKE 1 TABLET EVERY DAY  . Calcium Carbonate-Vitamin D (CALCIUM 600+D) 600-400 MG-UNIT per tablet Take 1 tablet by mouth daily.   . cetirizine (KLS ALLER-TEC) 10 MG tablet Take 10 mg by mouth daily.  . Cholecalciferol (EQL VITAMIN D3) 2000 units CAPS Take 4,000 Units daily by mouth.  . fluticasone (FLONASE) 50 MCG/ACT nasal spray USE 2 SPRAYS IN EACH NOSTRIL EVERY DAY  . furosemide (LASIX) 20 MG tablet TAKE 1 TABLET (20 MG TOTAL) BY MOUTH DAILY AS NEEDED FOR FLUID OR EDEMA.  . metFORMIN (GLUCOPHAGE-XR) 500 MG 24 hr tablet TAKE 1 TABLET THREE TIMES DAILY  . Multiple Vitamins-Minerals (CENTRUM SILVER PO) Take 1 tablet by mouth daily.   . simvastatin (ZOCOR) 20 MG tablet TAKE 1 TABLET AT BEDTIME  . TRELEGY ELLIPTA 100-62.5-25 MCG/INH AEPB INHALE 1 PUFF INTO THE LUNGS EVERY DAY  . TRUEPLUS LANCETS 33G MISC TEST ONE TIME DAILY   No facility-administered encounter medications on file as of 07/14/2018.    Physical Exam: Blood pressure 132/76, pulse 95, height 5' 4.5" (1.638 m), weight 245 lb (111.1 kg), SpO2 91 %. Gen:      No acute distress HEENT:  EOMI, sclera anicteric Neck:     No masses; no thyromegaly Lungs:    Clear to auscultation bilaterally; normal respiratory effort CV:         Regular rate and rhythm; no murmurs Abd:      + bowel sounds; soft, non-tender; no palpable masses,  no distension Ext:    No edema; adequate peripheral perfusion Skin:      Warm and dry; no rash Neuro: alert and oriented x 3 Psych: normal mood and affect  Data Reviewed: Imaging Screening CT chest 09/10/16 -moderate emphysema Screening CT 09/16/17-moderate emphysema, scattered subcentimeter pulmonary nodules.  Fatty liver with findings of cirrhosis. Chest x-ray 09/29/17-no active cardio pulmonary disease I reviewed the images personally.  PFTs 08/28/11 FVC 2.71 [92%], FEV1 1.33 (62%), F/F 49, TLC 114%, DLCO 46% Moderate obstructive defect with moderate  reduction in diffusion capacity. No bronchodilator response  11/08/15 FVC 2.05 [7%), FEV1 1.23 (55%), F/F 60 Moderate obstructive defect  09/24/16 FVC 2. (82%), FEV1 1.43 (62%), F/F 58 , TLC 99%, DLCO 54% Moderate obstruction with moderate diffusion defect. No bronchodilator response.  Labs A1AT 08/08/16- 162, PIMM CBC 10/01/2017-WBC 5.1, eos 0%  Assessment:  COPD GOLD D Stop Symbicort and start on Anoro. Can change her to LABA/LAMA combination with Anoro.  Likely does not need inh steroids as there is no bronchodilator response and peripheral eos are low.   Not wheezing on examination.  Would like to avoid steroids  Continue oxygen with exertion and at night  A1AT levels are normal and screening CT is ok.  Finished pulmonary rehab in 2018 Continue low dose screening CT  Health maintenance  04/01/2018-Influenza high-dose 07/27/10- Pneumovax 23 07/06/14- Prevnar 13  Plan/Recommendations: - We will stop the Symbicort and start you on an inhaler called Anoro - Supplemental O2 with exertion and at night - Continue low dose screening CT  Shannon Garfinkel MD [facility] 07/14/2018, 12:35 PM  CC: Shannon Ohara, MD

## 2018-07-14 NOTE — Patient Instructions (Addendum)
We will stop the Symbicort and start you on an inhaler called anoro Continue supplemental O2 with exertion and at night  Follow up in 3 months.

## 2018-07-21 ENCOUNTER — Telehealth: Payer: Self-pay | Admitting: Pulmonary Disease

## 2018-07-21 MED ORDER — UMECLIDINIUM-VILANTEROL 62.5-25 MCG/INH IN AEPB: 1.0000 | INHALATION_SPRAY | Freq: Every day | RESPIRATORY_TRACT | 0 refills | Status: DC

## 2018-07-21 NOTE — Telephone Encounter (Signed)
Spoke with pt, I advised her that I placed Anoro samples up front. She states she is waiting on her shipment from the mail order pharmacy. Nothing further is needed.   Assessment:  COPD GOLD D Stop Symbicort and start on Anoro. Can change her to LABA/LAMA combination with Anoro.  Likely does not need inh steroids as there is no bronchodilator response and peripheral eos are low.   Not wheezing on examination.  Would like to avoid steroids  Continue oxygen with exertion and at night  A1AT levels are normal and screening CT is ok.  Finished pulmonary rehab in 2018 Continue low dose screening CT

## 2018-07-28 ENCOUNTER — Telehealth: Payer: Self-pay | Admitting: Pulmonary Disease

## 2018-07-28 NOTE — Telephone Encounter (Signed)
Dr Vaughan Browner, please advise if okay for the pt to switch to MR, thanks

## 2018-07-29 NOTE — Telephone Encounter (Signed)
Message sent to Dr. Loanne Drilling to review.  Dr. Loanne Drilling - please advise. Thank you.

## 2018-07-29 NOTE — Telephone Encounter (Signed)
Patient is now asking if she can switch to see Dr. Loanne Drilling, as she is requesting a female physician.  CB is [phone].

## 2018-07-29 NOTE — Telephone Encounter (Signed)
That is ok with me. Thank you.

## 2018-07-29 NOTE — Telephone Encounter (Signed)
Noted by triage.  °Will close encounter.  °

## 2018-07-29 NOTE — Telephone Encounter (Signed)
Patient scheduled with Dr. Loanne Drilling on April 15,2020 at 9:30 am.

## 2018-07-29 NOTE — Telephone Encounter (Signed)
LMTCB X1 for pt.  Pt needs to be scheduled with Dr. Loanne Drilling in a 30 minute slot when she calls back.

## 2018-07-29 NOTE — Telephone Encounter (Signed)
Ok with me 

## 2018-07-30 ENCOUNTER — Other Ambulatory Visit: Payer: Self-pay | Admitting: Family Medicine

## 2018-07-30 DIAGNOSIS — E119 Type 2 diabetes mellitus without complications: Secondary | ICD-10-CM

## 2018-08-05 DIAGNOSIS — J449 Chronic obstructive pulmonary disease, unspecified: Secondary | ICD-10-CM | POA: Diagnosis not present

## 2018-08-05 DIAGNOSIS — J441 Chronic obstructive pulmonary disease with (acute) exacerbation: Secondary | ICD-10-CM | POA: Diagnosis not present

## 2018-08-09 ENCOUNTER — Telehealth: Payer: Self-pay | Admitting: Pulmonary Disease

## 2018-08-09 MED ORDER — FLUTICASONE-UMECLIDIN-VILANT 100-62.5-25 MCG/INH IN AEPB: 1.0000 | INHALATION_SPRAY | Freq: Every day | RESPIRATORY_TRACT | 0 refills | Status: DC

## 2018-08-09 NOTE — Telephone Encounter (Signed)
These recommendations were made by Dr. Vaughan Browner who is here in the office today. It is ok to restart Trelegy until she can get in to be seen.She needs to be seen by Dr. Loanne Drilling to determine her maintenance inhaler routine sooner than the April appointment scheduled.Please get her scheduled with Dr. Loanne Drilling sooner. Thanks

## 2018-08-09 NOTE — Telephone Encounter (Signed)
  Primary Pulmonologist: New to Dr. Loanne Drilling switching from Pm Last office visit and with whom: 07/14/18 PM What do we see them for (pulmonary problems): Copd Last OV assessment/plan: Stop Symbicort and start on Anoro. Can change her to LABA/LAMA combination with Anoro.  Likely does not need inh steroids as there is no bronchodilator response and peripheral eos are low.   Not wheezing on examination.  Would like to avoid steroids  Continue oxygen with exertion and at night  A1AT levels are normal and screening CT is ok.  Finished pulmonary rehab in 2018 Continue low dose screening CT  Health maintenance  04/01/2018-Influenza high-dose 07/27/10- Pneumovax 23 07/06/14- Prevnar 13  Plan/Recommendations: - We will stop the Symbicort and start you on an inhaler called Anoro - Supplemental O2 with exertion and at night - Continue low dose screening CT  Marshell Garfinkel MD [facility] 07/14/2018, 12:35 PM  CC: [name], MD  Was appointment offered to patient (explain)?  Yes declined at this time.   Reason for call: Patient was put on Anoro on 1/15/20and and has had increase sob and has now started coughing and has had headache daily Starting a week after she started the Anoro. She would like to be put on a different inhaler. She notes she felt the Trelegy inhaler work well for her before.  Denies fever and states cough in nonproductive.    SG please advise

## 2018-08-09 NOTE — Telephone Encounter (Signed)
Called and spoke with patient she verbalized understanding nothing further needed at this time. Apt made and order placed

## 2018-08-24 ENCOUNTER — Other Ambulatory Visit: Payer: Self-pay | Admitting: Pulmonary Disease

## 2018-08-24 ENCOUNTER — Ambulatory Visit: Payer: Medicare HMO | Admitting: Pulmonary Disease

## 2018-08-24 ENCOUNTER — Encounter: Payer: Self-pay | Admitting: Pulmonary Disease

## 2018-08-24 VITALS — BP 140/80 | HR 94 | Ht 64.0 in | Wt 245.0 lb

## 2018-08-24 DIAGNOSIS — J449 Chronic obstructive pulmonary disease, unspecified: Secondary | ICD-10-CM

## 2018-08-24 MED ORDER — FLUTICASONE-UMECLIDIN-VILANT 100-62.5-25 MCG/INH IN AEPB: 1.0000 | INHALATION_SPRAY | Freq: Every day | RESPIRATORY_TRACT | 0 refills | Status: DC

## 2018-08-24 NOTE — Patient Instructions (Signed)
COPD GOLD D -CONTINUE Trelegy 1 puff daily. Rinse mouth after each use -CONTINUE Albuterol 2 puffs as needed for shortness of breath or wheezing -RECOMMEND wearing your oxygen with activity and sleep

## 2018-08-24 NOTE — Progress Notes (Signed)
Subjective:   PATIENT ID: Shannon Schultz GENDER: female DOB: Apr 16, 1946, MRN: [id number]   HPI  Chief Complaint  Patient presents with  . Follow-up    Switching from Dr Shannon Schultz- Breathing had been doing well but she does state having increased SOB this morning "it's the weather".  She has occ cough with min clear sputum. She is using her albuterol inhaler approx 2 x per month and she rarely uses her neb.    Shannon Schultz is a 73 year old with COPD, moderate obstructive defect. She was previously seen by Dr. Melvyn Schultz in 2013. She has been maintained on Symbicort since 2013. She was hospitalized in February 2017 for the flu and COPD exacerbation. Switched to trelegy inhaler in 2018 with good response.   She has had an eventful year in 2018.  She lost her sister to breast cancer in early 2018, was diagnosed with breast cancer s/p lumpectomy, had bladder cancer and underwent resection and intravesicular chemo, fell and broke her right shoulder, underwent ventral hernia repair with mesh insertion.  Hospitalized for 2 days in April 2019 with COPD exacerbation.  Treated with IV steroids, nebs, Zithromax. Noted to have multiple bilateral lower extremity edema and given Lasix  Interim History: On her last visit with Dr. Vaughan Schultz.  She was stepped down from Trelegy to [facility] for one week however was unable to tolerate due to worsening shortness of breath and cough. Symptoms improved including resolution of daytime cough once she resumed Trelelgy. She denies any ED/Urgent care visits in since April 2019. She feels well controlled on Trelegy with baseline dyspnea on exertion. Able to perform activities of daily living including going to church and the store without limitation. She plans to use a bicycle that her husband purchased her to increase her activity. Denies chest pain, wheezing. Reports lower extremity swelling and orthopnea.   Social History: 46 pack years. Quit in 2012.  Environmental exposures:  No known exposures.  I have personally reviewed patient's past medical/family/social history, allergies, current medications.  Outpatient Medications Prior to Visit  Medication Sig Dispense Refill  . acetaminophen (TYLENOL) 500 MG tablet Take 1,000 mg every 6 (six) hours as needed by mouth for moderate pain or headache.     . albuterol (PROVENTIL HFA;VENTOLIN HFA) 108 (90 Base) MCG/ACT inhaler Inhale 2 puffs every 6 (six) hours as needed into the lungs for wheezing or shortness of breath.    Marland Kitchen albuterol (PROVENTIL) (2.5 MG/3ML) 0.083% nebulizer solution USE 1 VIAL BY NEBULIZATION EVERY FOUR HOURS AS NEEDED FOR WHEEZING OR SHORTNESS OF BREATH. 270 mL 0  . aspirin EC 81 MG tablet Take 81 mg daily by mouth.    . bisoprolol-hydrochlorothiazide (ZIAC) 10-6.25 MG tablet TAKE 1 TABLET EVERY DAY 90 tablet 0  . Calcium Carbonate-Vitamin D (CALCIUM 600+D) 600-400 MG-UNIT per tablet Take 1 tablet by mouth daily.     . cetirizine (KLS ALLER-TEC) 10 MG tablet Take 10 mg by mouth daily.    . Cholecalciferol (EQL VITAMIN D3) 2000 units CAPS Take 4,000 Units daily by mouth.    . fluticasone (FLONASE) 50 MCG/ACT nasal spray USE 2 SPRAYS IN EACH NOSTRIL EVERY DAY 48 g 0  . Fluticasone-Umeclidin-Vilant (TRELEGY ELLIPTA) 100-62.5-25 MCG/INH AEPB Inhale 1 puff into the lungs daily. 1 each 0  . furosemide (LASIX) 20 MG tablet TAKE 1 TABLET (20 MG TOTAL) BY MOUTH DAILY AS NEEDED FOR FLUID OR EDEMA. 30 tablet 0  . metFORMIN (GLUCOPHAGE-XR) 500 MG 24 hr tablet TAKE 1 TABLET THREE  TIMES DAILY 270 tablet 0  . Multiple Vitamins-Minerals (CENTRUM SILVER PO) Take 1 tablet by mouth daily.     . simvastatin (ZOCOR) 20 MG tablet TAKE 1 TABLET AT BEDTIME 90 tablet 0  . TRUEPLUS LANCETS 33G MISC TEST ONE TIME DAILY 100 each 3  . azithromycin (ZITHROMAX) 250 MG tablet Take 2 tablets (500 mg) on day 1, then take 1 tablet (250 mg) on days 2-5 6 tablet 0  . umeclidinium-vilanterol (ANORO ELLIPTA) 62.5-25 MCG/INH AEPB Inhale 1 puff  into the lungs daily. 180 each 2  . umeclidinium-vilanterol (ANORO ELLIPTA) 62.5-25 MCG/INH AEPB Inhale 1 puff into the lungs daily. 2 each 0   No facility-administered medications prior to visit.     Review of Systems  Constitutional: Negative for chills, diaphoresis, fever, malaise/fatigue and weight loss.  HENT: Negative for congestion, ear pain and sore throat.   Respiratory: Positive for cough and shortness of breath. Negative for hemoptysis, sputum production and wheezing.   Cardiovascular: Negative for chest pain, palpitations and leg swelling.  Gastrointestinal: Negative for abdominal pain, heartburn and nausea.  Genitourinary: Negative for frequency.  Musculoskeletal: Positive for joint pain and myalgias.  Skin: Negative for itching and rash.  Neurological: Negative for dizziness, weakness and headaches.  Endo/Heme/Allergies: Does not bruise/bleed easily.  Psychiatric/Behavioral: Negative for depression. The patient is not nervous/anxious.      Objective:   Vitals:   08/24/18 0917  BP: 140/80  Pulse: 94  SpO2: 98%  Weight: 245 lb (111.1 kg)  Height: 5\' 4"  (1.626 m)   SpO2: 98 % O2 Device: None (Room air)  Physical Exam: General: Well-appearing, no acute distress HENT: [place], AT, OP clear, MMM Eyes: EOMI, no scleral icterus Respiratory: Decreased breath sounds bilaterally.  No crackles, wheezing or rales Cardiovascular: RRR, systolic ejection murmur 3/6, no JVD GI: BS+, soft, nontender Extremities:1+ edema, non-pitting,-tenderness Neuro: AAO x4, CNII-XII grossly intact Skin: Intact, no rashes or bruising Psych: Normal mood, normal affect  Imaging Screening CT chest 09/10/16 -moderate emphysema Screening CT 09/16/17-moderate emphysema, scattered subcentimeter pulmonary nodules.  Fatty liver with findings of cirrhosis. Chest x-ray 09/29/17-no active cardio pulmonary disease  PFTs 08/28/11 FVC 2.71 [92%], FEV1 1.33 (62%), F/F 49, TLC 114%, DLCO 46% Moderate  obstructive defect with moderate reduction in diffusion capacity. No bronchodilator response  11/08/15 FVC 2.05 [7%), FEV1 1.23 (55%), F/F 60 Moderate obstructive defect  09/24/16 FVC 2. (82%), FEV1 1.43 (62%), F/F 58 , TLC 99%, DLCO 54% Moderate obstruction with moderate diffusion defect. No bronchodilator response.  Labs A1AT 08/08/16- 162, PIMM CBC 10/01/2017-WBC 5.1, eos 0%  Imaging, labs and tests noted above have been reviewed independently by me.    Assessment & Plan:   COPD GOLD D Previously completed rehab in 2018. Did not notice improvement in symptoms. -CONTINUE Trelegy 1 puff daily. Rinse mouth after each use -CONTINUE Albuterol 2 puffs as needed for shortness of breath or wheezing -RECOMMEND wearing your oxygen with activity and sleep  A1AT levels are normal and screening CT is ok.  Continue low dose screening CT - scheduled 08/2018  Health maintenance  04/01/2018-Influenza high-dose 07/27/10- Pneumovax 23 07/06/14- Prevnar 13  No orders of the defined types were placed in this encounter. No orders of the defined types were placed in this encounter.  Return in about 3 months (around 11/22/2018).  Cleota Pellerito Rodman Pickle, MD [facility] 08/24/2018 9:43 AM  Personal pager: [phone] If unanswered, please page CCM On-call: [phone]

## 2018-09-03 DIAGNOSIS — J449 Chronic obstructive pulmonary disease, unspecified: Secondary | ICD-10-CM | POA: Diagnosis not present

## 2018-09-03 DIAGNOSIS — J441 Chronic obstructive pulmonary disease with (acute) exacerbation: Secondary | ICD-10-CM | POA: Diagnosis not present

## 2018-09-10 ENCOUNTER — Encounter: Payer: Self-pay | Admitting: Medical

## 2018-09-10 ENCOUNTER — Other Ambulatory Visit: Payer: Self-pay

## 2018-09-10 ENCOUNTER — Ambulatory Visit (INDEPENDENT_AMBULATORY_CARE_PROVIDER_SITE_OTHER): Payer: Medicare HMO | Admitting: Medical

## 2018-09-10 ENCOUNTER — Telehealth: Payer: Self-pay | Admitting: Pulmonary Disease

## 2018-09-10 VITALS — BP 120/70 | HR 72 | Temp 97.7°F | Resp 16 | Ht 64.5 in | Wt 241.8 lb

## 2018-09-10 DIAGNOSIS — L03119 Cellulitis of unspecified part of limb: Secondary | ICD-10-CM

## 2018-09-10 DIAGNOSIS — E119 Type 2 diabetes mellitus without complications: Secondary | ICD-10-CM | POA: Diagnosis not present

## 2018-09-10 DIAGNOSIS — I872 Venous insufficiency (chronic) (peripheral): Secondary | ICD-10-CM

## 2018-09-10 DIAGNOSIS — L0390 Cellulitis, unspecified: Secondary | ICD-10-CM | POA: Insufficient documentation

## 2018-09-10 HISTORY — DX: Venous insufficiency (chronic) (peripheral): I87.2

## 2018-09-10 MED ORDER — CEPHALEXIN 500 MG PO CAPS: 500.0000 mg | ORAL_CAPSULE | Freq: Three times a day (TID) | ORAL | 0 refills | Status: DC

## 2018-09-10 MED ORDER — FLUTICASONE-UMECLIDIN-VILANT 100-62.5-25 MCG/INH IN AEPB: 1.0000 | INHALATION_SPRAY | Freq: Every day | RESPIRATORY_TRACT | 2 refills | Status: DC

## 2018-09-10 NOTE — Telephone Encounter (Signed)
Spoke with the pt  Trelegy refilled to last until next appt  Nothing further needed

## 2018-09-10 NOTE — Progress Notes (Signed)
Subjective:     Patient ID: Shannon Schultz, female   DOB: 14-Jul-1945, 73 y.o.   MRN: [id number]  HPI Chief Complaint  Patient presents with  . cellulitis    cellulitiis bilat legs X Wednesday   Here for possible cellulitis of both legs.   Has discoloration, has had worse swelling this past week, but that has improved with the fluid pills.   Legs feel hot, has 100 temp 2 nights ago, some chills.  The next morning was a little improved with temp.   No NVD.   No recent worsening glucose readings.   No injury, no trauma, no fall.   Checks sugars daily, getting 130s.    She had a much worse bout of this February of last year and ended up being on 2 different rounds of antibiotics and unna boot, and it finally got better.  She has a history of varicose veins since teenage years.  She has underlying chronic venous insufficiency   No other aggravating or relieving factors. No other complaint.   Past Medical History:  Diagnosis Date  . Aortic stenosis    mild-mod by 04/08/16 echo; mod AS 04/2017  . Arthritis    "hands" (05/15/2017)  . Bladder cancer ([place]) 2018  . Breast cancer, right ([place]) 1992   DCIS,bladder ca (just dx)  . Colon polyp   . Complication of anesthesia 1992   "local anesthesia" used was hard to awaken from-no problems since (05/15/2017)  . COPD (chronic obstructive pulmonary disease) ([place])   . Diverticulosis   . Dyspnea   . Elevated cholesterol   . Elevated liver enzymes    fatty liver per ultrasound per pt  . Environmental allergies    "I have allergies year round" (05/15/2017)  . Family history of malignant neoplasm of breast   . FHx: BRCA2 gene positive    sister with BRCA2 mutation (pt tested NEGATIVE)  . Genital HSV    gets on hip  . Heart murmur     echo 05/2011 mild aortic stenosis  . HSV (herpes simplex virus) infection    on hip--on daily suppression  . Hypertension   . Hypothyroidism    took med 7 yrs after birth of 1st child  . Impaired glucose  tolerance   . Migraine   . On home oxygen therapy    "have it available but I'm not using it" (05/15/2017)  . Osteopenia   . Pneumonia ~ 1950; 08/06/2010  . Type 2 diabetes mellitus ([place])   . Ventral hernia   . Vitamin D deficiency disease    Current Outpatient Medications on File Prior to Visit  Medication Sig Dispense Refill  . acetaminophen (TYLENOL) 500 MG tablet Take 1,000 mg every 6 (six) hours as needed by mouth for moderate pain or headache.     Marland Kitchen aspirin EC 81 MG tablet Take 81 mg daily by mouth.    . bisoprolol-hydrochlorothiazide (ZIAC) 10-6.25 MG tablet TAKE 1 TABLET EVERY DAY 90 tablet 0  . Calcium Carbonate-Vitamin D (CALCIUM 600+D) 600-400 MG-UNIT per tablet Take 1 tablet by mouth daily.     . cetirizine (KLS ALLER-TEC) 10 MG tablet Take 10 mg by mouth daily.    . Cholecalciferol (EQL VITAMIN D3) 2000 units CAPS Take 4,000 Units daily by mouth.    . fluticasone (FLONASE) 50 MCG/ACT nasal spray USE 2 SPRAYS IN EACH NOSTRIL EVERY DAY 48 g 0  . Fluticasone-Umeclidin-Vilant (TRELEGY ELLIPTA) 100-62.5-25 MCG/INH AEPB Inhale 1 puff into the lungs daily.  1 each 0  . metFORMIN (GLUCOPHAGE-XR) 500 MG 24 hr tablet TAKE 1 TABLET THREE TIMES DAILY 270 tablet 0  . Multiple Vitamins-Minerals (CENTRUM SILVER PO) Take 1 tablet by mouth daily.     . simvastatin (ZOCOR) 20 MG tablet TAKE 1 TABLET AT BEDTIME 90 tablet 0  . TRUEPLUS LANCETS 33G MISC TEST ONE TIME DAILY 100 each 3  . albuterol (PROVENTIL HFA;VENTOLIN HFA) 108 (90 Base) MCG/ACT inhaler Inhale 2 puffs every 6 (six) hours as needed into the lungs for wheezing or shortness of breath.    Marland Kitchen albuterol (PROVENTIL) (2.5 MG/3ML) 0.083% nebulizer solution USE 1 VIAL BY NEBULIZATION EVERY FOUR HOURS AS NEEDED FOR WHEEZING OR SHORTNESS OF BREATH. (Patient not taking: Reported on 09/10/2018) 270 mL 0  . furosemide (LASIX) 20 MG tablet TAKE 1 TABLET (20 MG TOTAL) BY MOUTH DAILY AS NEEDED FOR FLUID OR EDEMA. (Patient not taking: Reported on  09/10/2018) 30 tablet 0   No current facility-administered medications on file prior to visit.     Review of Systems As in subjective     Objective:   Physical Exam BP 120/70   Pulse 72   Temp 97.7 F (36.5 C) (Oral)   Resp 16   Ht 5' 4.5" (1.638 m)   Wt 241 lb 12.8 oz (109.7 kg)   LMP  (LMP Unknown)   SpO2 93%   BMI 40.86 kg/m   General: Well-developed well-nourished no acute distress, obese white female Distal third of bilateral legs with pinkish coloration but the distal half has more of a deeper purplish red coloration bilaterally with warmth, there is obvious varicose veins present in the lower extremities, she is slightly tender on palpation of the darker colored areas of the anterior lower legs, only slight swelling of the lower legs 1+ pedal pulses       Assessment:     Encounter Diagnoses  Name Primary?  . Cellulitis of lower extremity, unspecified laterality   . Type 2 diabetes mellitus without complication, without long-term current use of insulin (HCC) Yes  . Venous insufficiency        Plan:     Begin Keflex antibiotic, leg elevation, avoid injury or trauma to the legs.  I reviewed her 07/2017 eval and treatment for this.  Advise she follow-up with Dr. Tomi Bamberger next week for recheck but if much worse in the meantime call the after-hours line or go to hospital.  Continue Lasix as needed.  She has a follow-up appointment scheduled Dr. Tomi Bamberger next week in general  [name] was seen today for cellulitis.  Diagnoses and all orders for this visit:  Type 2 diabetes mellitus without complication, without long-term current use of insulin (HCC)  Cellulitis of lower extremity, unspecified laterality  Venous insufficiency  Other orders -     cephALEXin (KEFLEX) 500 MG capsule; Take 1 capsule (500 mg total) by mouth 3 (three) times daily.

## 2018-09-13 ENCOUNTER — Other Ambulatory Visit: Payer: Medicare HMO

## 2018-09-13 ENCOUNTER — Other Ambulatory Visit: Payer: Self-pay

## 2018-09-13 DIAGNOSIS — I10 Essential (primary) hypertension: Secondary | ICD-10-CM

## 2018-09-13 DIAGNOSIS — K7460 Unspecified cirrhosis of liver: Secondary | ICD-10-CM

## 2018-09-13 DIAGNOSIS — E7800 Pure hypercholesterolemia, unspecified: Secondary | ICD-10-CM | POA: Diagnosis not present

## 2018-09-13 DIAGNOSIS — E119 Type 2 diabetes mellitus without complications: Secondary | ICD-10-CM | POA: Diagnosis not present

## 2018-09-14 LAB — COMPREHENSIVE METABOLIC PANEL
ALT: 22 IU/L (ref 0–32)
AST: 32 IU/L (ref 0–40)
Albumin/Globulin Ratio: 1.5 (ref 1.2–2.2)
Albumin: 3.9 g/dL (ref 3.7–4.7)
Alkaline Phosphatase: 63 IU/L (ref 39–117)
BUN/Creatinine Ratio: 27 (ref 12–28)
BUN: 19 mg/dL (ref 8–27)
Bilirubin Total: 0.4 mg/dL (ref 0.0–1.2)
CO2: 25 mmol/L (ref 20–29)
Calcium: 9.3 mg/dL (ref 8.7–10.3)
Chloride: 103 mmol/L (ref 96–106)
Creatinine, Ser: 0.7 mg/dL (ref 0.57–1.00)
GFR calc Af Amer: 100 mL/min/{1.73_m2} (ref >59)
GFR calc non Af Amer: 87 mL/min/{1.73_m2} (ref >59)
Globulin, Total: 2.6 g/dL (ref 1.5–4.5)
Glucose: 123 mg/dL — ABNORMAL HIGH (ref 65–99)
Potassium: 4.5 mmol/L (ref 3.5–5.2)
Sodium: 143 mmol/L (ref 134–144)
Total Protein: 6.5 g/dL (ref 6.0–8.5)

## 2018-09-14 LAB — LIPID PANEL
Chol/HDL Ratio: 2.4 ratio (ref 0.0–4.4)
Cholesterol, Total: 111 mg/dL (ref 100–199)
HDL: 46 mg/dL (ref >39)
LDL Calculated: 45 mg/dL (ref 0–99)
Triglycerides: 99 mg/dL (ref 0–149)
VLDL Cholesterol Cal: 20 mg/dL (ref 5–40)

## 2018-09-14 LAB — HEMOGLOBIN A1C
Est. average glucose Bld gHb Est-mCnc: 137 mg/dL
Hgb A1c MFr Bld: 6.4 % — ABNORMAL HIGH (ref 4.8–5.6)

## 2018-09-14 LAB — MICROALBUMIN / CREATININE URINE RATIO
Creatinine, Urine: 66.6 mg/dL
Microalb/Creat Ratio: 14 mg/g creat (ref 0–29)
Microalbumin, Urine: 9 ug/mL

## 2018-09-14 NOTE — Progress Notes (Signed)
Chief Complaint  Patient presents with  . Hypertension    nonfasting med check, labs already done. No new concerns. Cellulitis is improving on Keflex.    She saw Audelia Acton 3/13 with discoloration and swelling in her lower legs, with lowgrade fever and some chills.  She was put on Keflex to treat cellulitis.  She presents today for recheck of this, along with it being her regularly scheduled med check for chronic medical issues.  Cellulitis:  Taking Keflex without side effects. She is keeping legs elevated. She reports legs are improving--less red, less painful.  It no longer hurts for her to walk like it did last week. No further fever. Denies weeping/drainage.  Denies significant swelling.  Diabetes: Blood sugars at home are running130's.  124 this morning. Denies hypoglycemia. Denies polydipsia and polyuria. Last diabetic eye exam wasin 01/2018, no retinopathy found.  Patient follows a low sugar diet and checks feet regularly without concerns. Denies numbness, tingling. Some intermittent numbness in the 2nd and 3rd toes on the right foot (h/o fractures, didn't heal right, per pt). No skin lesions. Takes metformin TID--this seems to be the most tolerable way to take it.  Hyperlipidemia follow-up: Patient is reportedly following a low-fat, low cholesterol diet. Compliant with simvastatin and denies medication side effects. See below for labs done prior to visit.  Hypertension: Blood pressure is only checked sporadically, always "fine". Denies headaches, dizziness, chest pain. Denies side effects of medications. Compliant with medication.   COPD: She is doing well on Trelegy. She last saw pulmonologist last month; no changes were made.  (She tried changing to [employer], but allergies kicked in, felt worse, and was switched back to Anoro; may consider switching again in the summer). Admits she isn't using her oxygen at night to sleep (the clicking bothers her). She does use it with  exercise.  Allergies:Using flonase nightly, along with zyrtec.  Allergies are flaring--some congestion and watery eyes.  She had been taking valtrex preventatively for HSV flare on hip. She stopped taking it after getting second dose of Shingrix (August), thinking that she couldn't get them anymore.  No flares since stopping it. (Last flare was the end of last year (November 2018), prior to getting second vaccination, when she cut the dose in half, to make it last longer). She hasn't taken it since second shingrix shot, and still hasn't had any flares since stopping daily Valtrex.  H/o bilateral trochanteric bursitis--last treated with cortisone injection into the right trochanteric bursa in October 2019. She reports she didn't get much relief.  She can find a comfortable position on her couch now (rather than sleeping in chair; sleeping in bed bothers both her hip and her shoulder).  Shoulder has improved, can now put her bra on.  Main issue is with pain sleeping in her bed (no problem on the couch).   PMH, PSH, SH reviewed  Outpatient Encounter Medications as of 09/15/2018  Medication Sig  . aspirin EC 81 MG tablet Take 81 mg daily by mouth.  . bisoprolol-hydrochlorothiazide (ZIAC) 10-6.25 MG tablet TAKE 1 TABLET EVERY DAY  . Calcium Carbonate-Vitamin D (CALCIUM 600+D) 600-400 MG-UNIT per tablet Take 1 tablet by mouth daily.   . cephALEXin (KEFLEX) 500 MG capsule Take 1 capsule (500 mg total) by mouth 3 (three) times daily.  . cetirizine (KLS ALLER-TEC) 10 MG tablet Take 10 mg by mouth daily.  . Cholecalciferol (EQL VITAMIN D3) 2000 units CAPS Take 4,000 Units daily by mouth.  . fluticasone (FLONASE) 50 MCG/ACT  nasal spray USE 2 SPRAYS IN EACH NOSTRIL EVERY DAY  . Fluticasone-Umeclidin-Vilant (TRELEGY ELLIPTA) 100-62.5-25 MCG/INH AEPB Inhale 1 puff into the lungs daily.  . metFORMIN (GLUCOPHAGE-XR) 500 MG 24 hr tablet TAKE 1 TABLET THREE TIMES DAILY  . Multiple Vitamins-Minerals (CENTRUM  SILVER PO) Take 1 tablet by mouth daily.   . simvastatin (ZOCOR) 20 MG tablet TAKE 1 TABLET AT BEDTIME  . TRUEPLUS LANCETS 33G MISC TEST ONE TIME DAILY  . acetaminophen (TYLENOL) 500 MG tablet Take 1,000 mg every 6 (six) hours as needed by mouth for moderate pain or headache.   . albuterol (PROVENTIL HFA;VENTOLIN HFA) 108 (90 Base) MCG/ACT inhaler Inhale 2 puffs every 6 (six) hours as needed into the lungs for wheezing or shortness of breath.  Marland Kitchen albuterol (PROVENTIL) (2.5 MG/3ML) 0.083% nebulizer solution USE 1 VIAL BY NEBULIZATION EVERY FOUR HOURS AS NEEDED FOR WHEEZING OR SHORTNESS OF BREATH. (Patient not taking: Reported on 09/10/2018)  . furosemide (LASIX) 20 MG tablet TAKE 1 TABLET (20 MG TOTAL) BY MOUTH DAILY AS NEEDED FOR FLUID OR EDEMA. (Patient not taking: Reported on 09/10/2018)   No facility-administered encounter medications on file as of 09/15/2018.    Allergies  Allergen Reactions  . Contrast Media [Iodinated Diagnostic Agents] Anaphylaxis, Shortness Of Breath and Other (See Comments)    Could not breath  . Iohexol Anaphylaxis, Shortness Of Breath and Other (See Comments)    Immediately could not breathe  . Lisinopril Anaphylaxis, Shortness Of Breath and Rash  . Sulfa Antibiotics Anaphylaxis and Other (See Comments)    Historical from mother, pt states that mother says she almost died from this drug  . Latex Other (See Comments)    Unless against on skin for a long time, blisters. Short term is okay.  . Codeine Nausea Only, Anxiety and Other (See Comments)    insomnia  . Levofloxacin Other (See Comments)    insomnia  . Lipitor [Atorvastatin Calcium] Rash  . Vicodin [Hydrocodone-Acetaminophen] Itching    ROS: no fever, chills, headaches, dizziness, chest pain or palpitations.  Breathing is at baseline, no shortness of breath or need for albuterol.  Has shortness of breath only with exercise.  She uses oxygen with exercise.  Recently started using a type of bike/pedals only and  does okay). No rash or bleeding. Cellulitis improving.   PHYSICAL EXAM:  BP 124/60   Pulse 76   Ht 5' 4.5" (1.638 m)   Wt 243 lb 12.8 oz (110.6 kg)   LMP  (LMP Unknown)   BMI 41.20 kg/m   Wt Readings from Last 3 Encounters:  09/15/18 243 lb 12.8 oz (110.6 kg)  09/10/18 241 lb 12.8 oz (109.7 kg)  08/24/18 245 lb (111.1 kg)    Well appearing, pleasant, obese female in good spirits.  HEENT: PERRL, EOMI, conjunctiva and sclera are clear. OP is clear. Neck: no lymphadenopathy or mass Heart: regular rate and rhythm, Murmur unchanged, loudest at RUSB Lungs: clear bilaterally, no wheezes, rales, ronchi Abdomen: soft, nontender, no mass. Obese. Extremities: Trace to 1+ edema bilaterally.  Hyperpigmentation/venous stasis changes bilaterally. Varicose vein noted right mid-shin, nontender Left mid-shin area has some erythema, almost looks abraded, but not weeping. Slightly warm. Neuro: alert and oriented, cranial nerves intact, normal gait Psych:  Normal mood, affect, hygiene and grooming  Lab Results  Component Value Date   HGBA1C 6.4 (H) 09/13/2018     Chemistry      Component Value Date/Time   NA 143 09/13/2018 0830   K 4.5  09/13/2018 0830   CL 103 09/13/2018 0830   CO2 25 09/13/2018 0830   BUN 19 09/13/2018 0830   CREATININE 0.70 09/13/2018 0830   CREATININE 0.75 03/05/2017 0736      Component Value Date/Time   CALCIUM 9.3 09/13/2018 0830   ALKPHOS 63 09/13/2018 0830   AST 32 09/13/2018 0830   ALT 22 09/13/2018 0830   BILITOT 0.4 09/13/2018 0830     Fasting glu 123 Urine microalbumin/Cr ratio 15.8  Lab Results  Component Value Date   CHOL 111 09/13/2018   HDL 46 09/13/2018   LDLCALC 45 09/13/2018   TRIG 99 09/13/2018   CHOLHDL 2.4 09/13/2018     ASSESSMENT/PLAN:  Cellulitis of lower extremity, unspecified laterality - improving on keflex, complete the course  Venous insufficiency - leg elevation, exercise, compression stockings recommended  COPD with  hypoxia (HCC) - stable on current regimen  Seasonal allergic rhinitis, unspecified trigger  Essential hypertension, benign - well controlled on current regimen, continue  Pure hypercholesterolemia  Type 2 diabetes mellitus with other skin complication, without long-term current use of insulin (HCC) - controlled  Class 3 severe obesity due to excess calories with serious comorbidity and body mass index (BMI) of 40.0 to 44.9 in adult [facility]) - counseled re: healthy diet, portion control. Encouraged to look into [facility] clinic   F/u 6 mos for CPE/AWV, with fasting labs prior c-met, TSH, A1c, CBC  No refills needed today  We discussed looking into [facility] (healthy weight and weight loss--Dr. Leafy Ro, Dr. Adair Patter, and others) as a good option to consider to help with weight loss.

## 2018-09-15 ENCOUNTER — Other Ambulatory Visit: Payer: Self-pay

## 2018-09-15 ENCOUNTER — Ambulatory Visit (INDEPENDENT_AMBULATORY_CARE_PROVIDER_SITE_OTHER): Payer: Medicare HMO | Admitting: Family Medicine

## 2018-09-15 ENCOUNTER — Encounter: Payer: Self-pay | Admitting: Family Medicine

## 2018-09-15 VITALS — BP 124/60 | HR 76 | Ht 64.5 in | Wt 243.8 lb

## 2018-09-15 DIAGNOSIS — Z5181 Encounter for therapeutic drug level monitoring: Secondary | ICD-10-CM

## 2018-09-15 DIAGNOSIS — L03119 Cellulitis of unspecified part of limb: Secondary | ICD-10-CM | POA: Diagnosis not present

## 2018-09-15 DIAGNOSIS — I10 Essential (primary) hypertension: Secondary | ICD-10-CM | POA: Diagnosis not present

## 2018-09-15 DIAGNOSIS — J302 Other seasonal allergic rhinitis: Secondary | ICD-10-CM | POA: Diagnosis not present

## 2018-09-15 DIAGNOSIS — R0902 Hypoxemia: Secondary | ICD-10-CM

## 2018-09-15 DIAGNOSIS — J449 Chronic obstructive pulmonary disease, unspecified: Secondary | ICD-10-CM

## 2018-09-15 DIAGNOSIS — E7800 Pure hypercholesterolemia, unspecified: Secondary | ICD-10-CM | POA: Diagnosis not present

## 2018-09-15 DIAGNOSIS — Z6841 Body Mass Index (BMI) 40.0 and over, adult: Secondary | ICD-10-CM

## 2018-09-15 DIAGNOSIS — E11628 Type 2 diabetes mellitus with other skin complications: Secondary | ICD-10-CM | POA: Diagnosis not present

## 2018-09-15 DIAGNOSIS — I872 Venous insufficiency (chronic) (peripheral): Secondary | ICD-10-CM

## 2018-09-15 NOTE — Patient Instructions (Addendum)
We discussed looking into [facility] (healthy weight and weight loss--Dr. Leafy Ro, Dr. Adair Patter, and others) as a good option to consider to help with weight loss.   Continue all of your current medications.  Keep your legs elevated--try wearing your compression stockings daily. Complete the course of Keflex. Let us know if your redness and pain worsen/recur.

## 2018-09-20 ENCOUNTER — Inpatient Hospital Stay: Admission: RE | Admit: 2018-09-20 | Payer: Medicare HMO | Source: Ambulatory Visit

## 2018-10-04 DIAGNOSIS — J441 Chronic obstructive pulmonary disease with (acute) exacerbation: Secondary | ICD-10-CM | POA: Diagnosis not present

## 2018-10-04 DIAGNOSIS — J449 Chronic obstructive pulmonary disease, unspecified: Secondary | ICD-10-CM | POA: Diagnosis not present

## 2018-10-13 ENCOUNTER — Ambulatory Visit: Payer: Medicare HMO | Admitting: Pulmonary Disease

## 2018-10-18 ENCOUNTER — Encounter: Payer: Self-pay | Admitting: Family Medicine

## 2018-10-18 ENCOUNTER — Other Ambulatory Visit: Payer: Self-pay

## 2018-10-18 ENCOUNTER — Ambulatory Visit (INDEPENDENT_AMBULATORY_CARE_PROVIDER_SITE_OTHER): Payer: Medicare HMO | Admitting: Family Medicine

## 2018-10-18 VITALS — BP 132/60 | Temp 97.7°F | Wt 245.0 lb

## 2018-10-18 DIAGNOSIS — M79642 Pain in left hand: Secondary | ICD-10-CM

## 2018-10-18 DIAGNOSIS — M79641 Pain in right hand: Secondary | ICD-10-CM

## 2018-10-18 NOTE — Progress Notes (Signed)
   Subjective:    Patient ID: Shannon Schultz, female    DOB: [birth year], 73 y.o.   MRN: [id number]  Interactive audio and video telecommunications were attempted between this provider and patient, however due to patient not having access to video capability, we continued and completed visit with audio only.  The patient was located at home. 2 patient identifiers.  The provider was located in the office. The patient did consent to this visit and is aware of possible charges through their insurance for this visit.  The other persons participating in this telemedicine service were none.    HPI Chief Complaint  Patient presents with  . joint swelling    hands are swelling and joints hurt. swelling has gone down. for the last week. taking fluid pill last tuesday for swelling. pain level 4/10. hurts to squeeze   Complains of a 1 week history of bilateral hand pain. States she initially had swelling in her fingers and even in her toes so she took a Lasix. States swelling has essentially resolved. She continues having some hand pain but this is at least 70% improved. States at onset she could not even make a fist with either hand. Now she is using her hands but not totally pain free. Pain is 8/54 with certain movements still.  No redness, increased warmth,  numbness, tingling. States her wrists are fine.  States this happened one other time in her 56s and resolved without intervention. States she was negative for lupus at that time.   Denies any increased salt or diet changes. Denies having LE edema.   BS running between 128-130   History of arthritis, especially in her hands and hips. Also has a rotator cuff tear.  Orthopedist Dr. Rhona Raider  Denies fever, chills, dizziness, headache, rash, chest pain, palpitations, shortness of breath, abdominal pain, N/V/D.    Review of Systems Pertinent positives and negatives in the history of present illness.     Objective:   Physical Exam BP 132/60    Temp 97.7 F (36.5 C) (Oral)   Wt 245 lb (111.1 kg)   LMP  (LMP Unknown)   BMI 41.40 kg/m   Alert and oriented and in no acute distress. Normal speech, mood and thought process.       Assessment & Plan:  Bilateral hand pain No red flag symptoms. Appears euvolemic.  Reviewed recent office notes from PCP and lab results from March.  Discussed that since she is at least 70% improved that we will do active surveillance of her symptoms. She will pay close attention to diet and avoid foods high in sodium. Make sure she is adequately hydrated.  We will check back in 2-3 days with her or she will call sooner if symptoms are worsening.  Hold off on Lasix.  Time spent on call was 16 minutes and in review of previous records 4 minutes total.  This virtual service is not related to other E/M service within previous 7 days.

## 2018-10-20 ENCOUNTER — Telehealth: Payer: Self-pay | Admitting: Internal Medicine

## 2018-10-20 NOTE — Telephone Encounter (Signed)
Pt states she is doing a tad better, still hurting but not as bad as the other day when she did a virtual visit

## 2018-10-20 NOTE — Telephone Encounter (Signed)
Thanks for the update

## 2018-10-28 ENCOUNTER — Other Ambulatory Visit: Payer: Self-pay | Admitting: Family Medicine

## 2018-10-28 DIAGNOSIS — E119 Type 2 diabetes mellitus without complications: Secondary | ICD-10-CM

## 2018-10-28 MED ORDER — METFORMIN HCL ER 500 MG PO TB24: 500.0000 mg | ORAL_TABLET | Freq: Three times a day (TID) | ORAL | 0 refills | Status: DC

## 2018-10-28 MED ORDER — SIMVASTATIN 20 MG PO TABS: 20.0000 mg | ORAL_TABLET | Freq: Every day | ORAL | 0 refills | Status: DC

## 2018-11-03 DIAGNOSIS — J441 Chronic obstructive pulmonary disease with (acute) exacerbation: Secondary | ICD-10-CM | POA: Diagnosis not present

## 2018-11-03 DIAGNOSIS — J449 Chronic obstructive pulmonary disease, unspecified: Secondary | ICD-10-CM | POA: Diagnosis not present

## 2018-11-11 ENCOUNTER — Other Ambulatory Visit: Payer: Self-pay | Admitting: Family Medicine

## 2018-11-15 ENCOUNTER — Other Ambulatory Visit: Payer: Self-pay | Admitting: Family Medicine

## 2018-11-15 ENCOUNTER — Other Ambulatory Visit: Payer: Self-pay | Admitting: Acute Care

## 2018-11-15 DIAGNOSIS — Z87891 Personal history of nicotine dependence: Secondary | ICD-10-CM

## 2018-11-15 DIAGNOSIS — Z9011 Acquired absence of right breast and nipple: Secondary | ICD-10-CM

## 2018-11-17 DIAGNOSIS — Z8551 Personal history of malignant neoplasm of bladder: Secondary | ICD-10-CM | POA: Diagnosis not present

## 2018-11-23 ENCOUNTER — Encounter: Payer: Self-pay | Admitting: Primary Care

## 2018-11-23 ENCOUNTER — Ambulatory Visit: Payer: Medicare HMO | Admitting: Pulmonary Disease

## 2018-11-23 ENCOUNTER — Ambulatory Visit: Payer: Medicare HMO | Admitting: Nurse Practitioner

## 2018-11-23 ENCOUNTER — Ambulatory Visit (INDEPENDENT_AMBULATORY_CARE_PROVIDER_SITE_OTHER): Payer: Medicare HMO | Admitting: Primary Care

## 2018-11-23 ENCOUNTER — Other Ambulatory Visit: Payer: Self-pay

## 2018-11-23 DIAGNOSIS — J449 Chronic obstructive pulmonary disease, unspecified: Secondary | ICD-10-CM | POA: Diagnosis not present

## 2018-11-23 NOTE — Patient Instructions (Addendum)
  COPD - Continue Trelegy 1 puff daily until complete then change to Anoro (if you find yourself using your rescue inhaler more let us know) - Continue Albtuerol 2 puffs every 4-6 hours prn sob/cough  Chronic hypoxemic respiratory failure - Continue oxygen with activity and at night  Follow -up: - 3-4 months with Dr. Loanne Drilling - Needs LDCT in June 2020 ([facility])

## 2018-11-23 NOTE — Progress Notes (Signed)
Virtual Visit via Telephone Note  I connected with Shannon Schultz on 11/23/18 at  9:00 AM EDT by telephone and verified that I am speaking with the correct person using two identifiers.  Location: Patient: Home Provider: Office   I discussed the limitations, risks, security and privacy concerns of performing an evaluation and management service by telephone and the availability of in person appointments. I also discussed with the patient that there may be a patient responsible charge related to this service. The patient expressed understanding and agreed to proceed.   History of Present Illness: 73 year old female, former smoker quit 2012. PMH significant for COPD GROUP D, chronic hypoxemic respiratory failure, sinusitis, cirrhosis of the liver, type 2 diabetes, urothelial cancer. Patient of Dr. Loanne Drilling, seen 08/24/18.   11/23/2018 Patient called today for 3 month follow-up. She is doing well, no new symptoms. Continues using Trelegy as prescribed. Rarely requires her Albuterol rescue inhaler. Wearing 2L oxygen on exertion only. Asking if she can go back to Anoro during the summer time because her breathing symptoms are stable and she is afraid of going into the donut hole. She is due for LDCT scan in June 2002.   Observations/Objective:  - O2 95% RA at rest   - No shortness of breath, wheezing or cough noted during phone conversation   Assessment and Plan:  COPD - Continue Trelegy 1 puff daily until complete; then resume Anoro 1 puff daily  - Continue Albuterol 2 puffs every 4-6 hours prn sob/cough - Due for LDCT scan in June 2020   Chronic hypoxemic respiratory failure - Continue oxygen with activity and at night   Follow Up Instructions:   3-4 months with Dr. Loanne Drilling or sooner if needed  I discussed the assessment and treatment plan with the patient. The patient was provided an opportunity to ask questions and all were answered. The patient agreed with the plan and demonstrated  an understanding of the instructions.   The patient was advised to call back or seek an in-person evaluation if the symptoms worsen or if the condition fails to improve as anticipated.  I provided 18 minutes of non-face-to-face time during this encounter.   Martyn Ehrich, NP

## 2018-11-26 ENCOUNTER — Telehealth: Payer: Self-pay | Admitting: *Deleted

## 2018-11-26 NOTE — Telephone Encounter (Signed)
Script Screening patients for COVID-19 and reviewing new operational procedures  Greeting - The reason I am calling is to share with you some new changes to our processes that are designed to help [place] keep everyone safe. Is now a good time to speak with you? Patient says "no' - ask them when you can call back and let them know it's important to do this prior to their appointment.  Patient says "yes" - Great, ([name]) the first thing I need to do is ask you some screening Questions.  1. To the best of your knowledge, have you been in close contact with any one with a confirmed diagnosis of COVID 19? o No - proceed to next question   2. Have you had any one or more of the following: fever, chills, cough, shortness of breath or any flu-like symptoms? o No - proceed to next question o Yes - Schedule patient for virtual visit  3. Have you been diagnosed with or have a previous diagnosis of COVID 19? o No - proceed to next question  4. I am going to go over a few other symptoms with you. Please let me know if you are experiencing any of the following: . Ear, nose or throat discomfort . A sore throat . Headache . Muscle pain . Diarrhea . Loss of taste or smell o No - proceed to next question  Thank you for answering these questions. Please know we will ask you these questions or similar questions when you arrive for your appointment and again it's how we are keeping everyone safe. Also, to keep you safe, please use the provided hand sanitizer when you enter the building. [name], we are asking everyone in the building to wear a mask because they help [place] prevent the spread of germs. Do you have a mask of your own, if not, we are happy to provide one for you. The last thing I want to go over with you is the no visitor guidelines. This means no one can attend the appointment with you unless you need physical assistance. I understand this may be different from your past appointments and I know  this may be difficult but please know if someone is driving you we are happy to call them for you once your appointment is over.  [INSERT SITE SPECIFIC CHECK IN [facility] I've given you a lot of information, what questions do you have about what I've talked about today or your appointment tomorrow?

## 2018-11-29 ENCOUNTER — Telehealth: Payer: Self-pay | Admitting: Acute Care

## 2018-11-29 ENCOUNTER — Other Ambulatory Visit: Payer: Self-pay

## 2018-11-29 ENCOUNTER — Telehealth: Payer: Self-pay | Admitting: Family Medicine

## 2018-11-29 ENCOUNTER — Ambulatory Visit (INDEPENDENT_AMBULATORY_CARE_PROVIDER_SITE_OTHER)
Admission: RE | Admit: 2018-11-29 | Discharge: 2018-11-29 | Disposition: A | Discharge status: Home / Self Care | Payer: Medicare HMO | Source: Ambulatory Visit | Attending: Acute Care | Admitting: Acute Care

## 2018-11-29 DIAGNOSIS — Z87891 Personal history of nicotine dependence: Secondary | ICD-10-CM

## 2018-11-29 NOTE — Telephone Encounter (Signed)
Call Report from [facility] Radiology. Spoke with Diane. Per Diane, pt had CT Chest Lung Cancer Screen Low Dose performed today 11/29/2018.   IMPRESSION: 1. Lung-RADS 4A, suspicious. Follow up low-dose chest CT without contrast in 3 months (please use the following order, "CT CHEST LCS NODULE FOLLOW-UP W/O CM") is recommended. 2. Three-vessel coronary atherosclerosis. 3. Diffuse hepatic steatosis with morphologic changes in the liver suggesting cirrhosis.  Routing to Judson Roch as an Pharmacist, hospital and for her recommendations as well.

## 2018-11-29 NOTE — Telephone Encounter (Signed)
These results will be called to the patient through the screening program.

## 2018-11-29 NOTE — Telephone Encounter (Signed)
PT needs referral to derm for dark spot on cheek.  She is a pt of Dr Elvera Lennox and her insurance requires a referral.

## 2018-11-29 NOTE — Telephone Encounter (Addendum)
Noted. Routing to [facility] Lung Nodule Pool per resulting protocol.

## 2018-11-29 NOTE — Telephone Encounter (Signed)
(  we had Shannon Schultz noted in her chart, I think that is the proper name) Okay to do referral

## 2018-11-29 NOTE — Telephone Encounter (Signed)
Is this okay-was sent to me.

## 2018-11-29 NOTE — Telephone Encounter (Signed)
Patient scheduled with [facility] 12/01/2018 @ 10:10am, patient advised.

## 2018-11-30 ENCOUNTER — Telehealth: Payer: Self-pay | Admitting: Acute Care

## 2018-11-30 DIAGNOSIS — Z122 Encounter for screening for malignant neoplasm of respiratory organs: Secondary | ICD-10-CM

## 2018-11-30 DIAGNOSIS — H35363 Drusen (degenerative) of macula, bilateral: Secondary | ICD-10-CM | POA: Diagnosis not present

## 2018-11-30 DIAGNOSIS — E119 Type 2 diabetes mellitus without complications: Secondary | ICD-10-CM | POA: Diagnosis not present

## 2018-11-30 DIAGNOSIS — H25013 Cortical age-related cataract, bilateral: Secondary | ICD-10-CM | POA: Diagnosis not present

## 2018-11-30 DIAGNOSIS — H35033 Hypertensive retinopathy, bilateral: Secondary | ICD-10-CM | POA: Diagnosis not present

## 2018-11-30 DIAGNOSIS — H2513 Age-related nuclear cataract, bilateral: Secondary | ICD-10-CM | POA: Diagnosis not present

## 2018-11-30 LAB — HM DIABETES EYE EXAM

## 2018-11-30 NOTE — Addendum Note (Signed)
Addended by: Doroteo Glassman D on: 11/30/2018 10:09 AM   Modules accepted: Orders

## 2018-11-30 NOTE — Telephone Encounter (Signed)
I have called Ms. Marsala with the results of her scan. I explained that her scan had changed in the last year and that there is an area in her right upper lobe that has increased in size. I explained that we would need to do a 58-month follow-up CT scan to ensure stability, or to evaluate for further increase in size.  She verbalized understanding.  I told her someone would call her to schedule her 75-month follow-up.  Denise please schedule 3 month follow-up low-dose CT. Please fax results to PCP. Please let PCP know our plans for 27-month follow-up. Thank you   Low Dose CT 11/29/2018 Lung-RADS 4A, suspicious. Follow up low-dose chest CT without contrast in 3 months (please use the following order, "CT CHEST LCS NODULE FOLLOW-UP W/O CM") is recommended. 2. Three-vessel coronary atherosclerosis. 3. Diffuse hepatic steatosis with morphologic changes in the liver suggesting cirrhosis.  Lungs/Pleura: No pneumothorax. No pleural effusion. Moderate centrilobular emphysema. No acute consolidative airspace disease or lung masses. New solid posterior right upper lobe pulmonary nodule measuring 6.1 mm in volume derived mean diameter in the posterior no significant growth of the previously visualized scattered pulmonary Nodules.  Previous scan 08/2018 was a Lung RADS 2: nodules that are benign in appearance and behavior with a very low likelihood of becoming a clinically active cancer due to size or lack of growth. Recommendation per radiology is for a repeat LDCT in 12 months.

## 2018-11-30 NOTE — Telephone Encounter (Signed)
CT results sent to PCP.  Order placed for 3 mth f/u low dose ct.

## 2018-11-30 NOTE — Telephone Encounter (Signed)
See telephone note 11/30/18.  Will close this message.

## 2018-12-01 ENCOUNTER — Encounter: Payer: Self-pay | Admitting: *Deleted

## 2018-12-01 DIAGNOSIS — L821 Other seborrheic keratosis: Secondary | ICD-10-CM | POA: Diagnosis not present

## 2018-12-01 DIAGNOSIS — I788 Other diseases of capillaries: Secondary | ICD-10-CM | POA: Diagnosis not present

## 2018-12-04 DIAGNOSIS — J441 Chronic obstructive pulmonary disease with (acute) exacerbation: Secondary | ICD-10-CM | POA: Diagnosis not present

## 2018-12-04 DIAGNOSIS — J449 Chronic obstructive pulmonary disease, unspecified: Secondary | ICD-10-CM | POA: Diagnosis not present

## 2018-12-11 ENCOUNTER — Other Ambulatory Visit: Payer: Self-pay

## 2018-12-11 ENCOUNTER — Ambulatory Visit
Admission: RE | Admit: 2018-12-11 | Discharge: 2018-12-11 | Disposition: A | Discharge status: Home / Self Care | Payer: Medicare HMO | Source: Ambulatory Visit | Attending: Family Medicine | Admitting: Family Medicine

## 2018-12-11 DIAGNOSIS — Z9011 Acquired absence of right breast and nipple: Secondary | ICD-10-CM

## 2018-12-11 DIAGNOSIS — Z1231 Encounter for screening mammogram for malignant neoplasm of breast: Secondary | ICD-10-CM | POA: Diagnosis not present

## 2018-12-23 ENCOUNTER — Other Ambulatory Visit: Payer: Self-pay | Admitting: Family Medicine

## 2019-01-03 DIAGNOSIS — J449 Chronic obstructive pulmonary disease, unspecified: Secondary | ICD-10-CM | POA: Diagnosis not present

## 2019-01-03 DIAGNOSIS — J441 Chronic obstructive pulmonary disease with (acute) exacerbation: Secondary | ICD-10-CM | POA: Diagnosis not present

## 2019-01-07 ENCOUNTER — Telehealth: Payer: Self-pay | Admitting: Pulmonary Disease

## 2019-01-07 MED ORDER — ALBUTEROL SULFATE HFA 108 (90 BASE) MCG/ACT IN AERS: 2.0000 | INHALATION_SPRAY | Freq: Four times a day (QID) | RESPIRATORY_TRACT | 5 refills | Status: DC | PRN

## 2019-01-07 NOTE — Telephone Encounter (Signed)
Rx for pt's proair inhaler has been sent to preferred pharmacy. Called and spoke with pt letting her know this had been done and pt verbalized understanding. Nothing further needed.

## 2019-01-10 ENCOUNTER — Other Ambulatory Visit: Payer: Self-pay | Admitting: Family Medicine

## 2019-01-10 DIAGNOSIS — E119 Type 2 diabetes mellitus without complications: Secondary | ICD-10-CM

## 2019-01-11 NOTE — Telephone Encounter (Signed)
Has an appt in october 

## 2019-01-19 ENCOUNTER — Telehealth: Payer: Self-pay | Admitting: Family Medicine

## 2019-01-19 NOTE — Telephone Encounter (Signed)
Pt called and states that she feels like she has bugs crawling all over her legs and states that they feel like they are burnung states that it has been going on for months, she passed the health screening for a in office visit, I just didn't know if you wanted a in office visit or a virtual visit   Pt can be reached at [phone]

## 2019-01-19 NOTE — Telephone Encounter (Signed)
If there is no actual rash, then should be virtual visit. Can be 15 minutes tomorrow.

## 2019-01-19 NOTE — Progress Notes (Signed)
Start time: 1:32 Had some trouble with unstable internet, switched to audio for the history, but was able to get back on video and look at her legs End time: 1:49   Virtual Visit via Video Note  I connected with Shannon Schultz on 01/20/2019 by a video enabled telemedicine application and verified that I am speaking with the correct person using two identifiers.  Location: Patient: home Provider: office   I discussed the limitations of evaluation and management by telemedicine and the availability of in person appointments. The patient expressed understanding and agreed to proceed. Consents to insurance being filed for this visit.  History of Present Illness:  Chief Complaint  Patient presents with  . Leg Pain    bilateral below knee radiating to ankles xcouple of months    Patient she feels like she has bugs crawling all over her legs and states that they feel like they are burning, sometimes is tingling, and sometimes feels short electrical shocks.  Sometimes it feels like water is running down them (but denies swelling and weeping).  She reports this has been going on for months.   Starts in the shins, but also has some in the calves, and goes down to the ankle, sometimes the top of the foot.  Doesn't feel like her ankles bend well, but denies swelling.  She did have some swelling, took diuretic for 5 days and swelling resolved. Denies any numbness in her feet. Feet are either very cold, or very hot. Feels better with activity (?due to being distracted). Varicose veins--unchanged, nontender. Hasn't been wearing her compression stockings.  Diabetes has been well controlled. Lab Results  Component Value Date   HGBA1C 6.4 (H) 09/13/2018     PMH, PSH, SH reviewed  Outpatient Encounter Medications as of 01/20/2019  Medication Sig  . ACCU-CHEK AVIVA PLUS test strip TEST BLOOD SUGAR ONE TO TWO TIMES DAILY  . acetaminophen (TYLENOL) 500 MG tablet Take 1,000 mg every 6 (six) hours as  needed by mouth for moderate pain or headache.   . albuterol (PROVENTIL) (2.5 MG/3ML) 0.083% nebulizer solution USE 1 VIAL BY NEBULIZATION EVERY FOUR HOURS AS NEEDED FOR WHEEZING OR SHORTNESS OF BREATH.  Marland Kitchen albuterol (VENTOLIN HFA) 108 (90 Base) MCG/ACT inhaler Inhale 2 puffs into the lungs every 6 (six) hours as needed for wheezing or shortness of breath.  Marland Kitchen aspirin EC 81 MG tablet Take 81 mg daily by mouth.  . bisoprolol-hydrochlorothiazide (ZIAC) 10-6.25 MG tablet TAKE 1 TABLET EVERY DAY  . Calcium Carbonate-Vitamin D (CALCIUM 600+D) 600-400 MG-UNIT per tablet Take 1 tablet by mouth daily.   . cetirizine (KLS ALLER-TEC) 10 MG tablet Take 10 mg by mouth daily.  . Cholecalciferol (EQL VITAMIN D3) 2000 units CAPS Take 4,000 Units daily by mouth.  . fluticasone (FLONASE) 50 MCG/ACT nasal spray USE 2 SPRAYS IN EACH NOSTRIL EVERY DAY  . furosemide (LASIX) 20 MG tablet TAKE 1 TABLET (20 MG TOTAL) BY MOUTH DAILY AS NEEDED FOR FLUID OR EDEMA.  . metFORMIN (GLUCOPHAGE-XR) 500 MG 24 hr tablet TAKE 1 TABLET THREE TIMES DAILY  . Multiple Vitamins-Minerals (CENTRUM SILVER PO) Take 1 tablet by mouth daily.   . simvastatin (ZOCOR) 20 MG tablet Take 1 tablet (20 mg total) by mouth at bedtime.  . triamcinolone cream (KENALOG) 0.1 % APPLY TO AFFECTED AREA TWICE A DAY  . TRUEplus Lancets 30G MISC TEST BLOOD SUGAR ONE TO TWO TIMES DAILY  . umeclidinium-vilanterol (ANORO ELLIPTA) 62.5-25 MCG/INH AEPB Inhale 1 puff into the  lungs daily.  . Fluticasone-Umeclidin-Vilant (TRELEGY ELLIPTA) 100-62.5-25 MCG/INH AEPB Inhale 1 puff into the lungs daily. (Patient not taking: Reported on 01/20/2019)   No facility-administered encounter medications on file as of 01/20/2019.    ROS: no fever, chills, URI symptoms, chest pain, change in breathing.  No GI complaints.  Edema improved after using diuretic recently.  Leg complaints per HPI.   Observations/Objective: BP 130/65   Temp (!) 97.5 F (36.4 C) (Oral)   Ht 5' 4.5"  (1.638 m)   Wt 244 lb (110.7 kg)   LMP  (LMP Unknown)   BMI 41.24 kg/m   Well-appearing, pleasant female, in good spirits, in no distress She is alert, oriented.  Cranial nerves are grossly intact. Extremities: Some discoloration and thickening of the skin (she showed me the left leg with the video).  There is no erythema, drainage/weeping.  There is trace pitting edema.   Assessment and Plan:  Pain in both lower extremities - Ddx discussed including related to venous stasis, vs neuropathy. Treat first for stasis/VV   Venous insufficiency - discussed exercise, compression, elevation   Peripheral edema - improved after diuretic use, still has trace edema.  low Na diet, compression and leg elevation recommended    Please try wearing your compression stockings/socks (at least while you're in your air conditioned home). Try and get some regular exercise to get the calf muscles contracting--walking around the house, doing calf raises (standing on your tip toes, up and down, to get the calf muscles working). When you are seated for prolonged periods (watching TV, reading), keep your legs elevated.  If your discomfort doesn't improve, then schedule an in-office visit for recheck, and we may then decide/discuss whether we try medications to treat neuropathy.    Give me an update through [place] in the next couple of weeks.  Stay well!   Follow Up Instructions:    I discussed the assessment and treatment plan with the patient. The patient was provided an opportunity to ask questions and all were answered. The patient agreed with the plan and demonstrated an understanding of the instructions.   The patient was advised to call back or seek an in-person evaluation if the symptoms worsen or if the condition fails to improve as anticipated.  I provided 17 minutes of non-face-to-face time during this encounter.   [name], MD

## 2019-01-20 ENCOUNTER — Encounter: Payer: Self-pay | Admitting: Family Medicine

## 2019-01-20 ENCOUNTER — Ambulatory Visit (INDEPENDENT_AMBULATORY_CARE_PROVIDER_SITE_OTHER): Payer: Medicare HMO | Admitting: Family Medicine

## 2019-01-20 ENCOUNTER — Other Ambulatory Visit: Payer: Self-pay

## 2019-01-20 VITALS — BP 130/65 | Temp 97.5°F | Ht 64.5 in | Wt 244.0 lb

## 2019-01-20 DIAGNOSIS — R609 Edema, unspecified: Secondary | ICD-10-CM

## 2019-01-20 DIAGNOSIS — I872 Venous insufficiency (chronic) (peripheral): Secondary | ICD-10-CM

## 2019-01-20 DIAGNOSIS — M79605 Pain in left leg: Secondary | ICD-10-CM | POA: Diagnosis not present

## 2019-01-20 DIAGNOSIS — R600 Localized edema: Secondary | ICD-10-CM

## 2019-01-20 DIAGNOSIS — M79604 Pain in right leg: Secondary | ICD-10-CM | POA: Diagnosis not present

## 2019-01-20 NOTE — Patient Instructions (Addendum)
The discomfort that you're having SOUNDS like it could be neuropathy, but the AREA that is bothering you sounds more like discomfort from varicose veins or venous stasis (blood pooling in the veins). So, since it has been hot and you may not be getting as much exercise over these last couple of months, let's try treating the latter first.   Please try wearing your compression stockings/socks (at least while you're in your air conditioned home). Try and get some regular exercise to get the calf muscles contracting--walking around the house, doing calf raises (standing on your tip toes, up and down, to get the calf muscles working). When you are seated for prolonged periods (watching TV, reading), keep your legs elevated.  If your discomfort doesn't improve, then schedule an in-office visit for recheck, and we may then decide/discuss whether we try medications to treat neuropathy.    Give me an update through [place] in the next couple of weeks.  Stay well!

## 2019-01-25 ENCOUNTER — Other Ambulatory Visit: Payer: Self-pay | Admitting: Family Medicine

## 2019-02-03 DIAGNOSIS — J441 Chronic obstructive pulmonary disease with (acute) exacerbation: Secondary | ICD-10-CM | POA: Diagnosis not present

## 2019-02-03 DIAGNOSIS — J449 Chronic obstructive pulmonary disease, unspecified: Secondary | ICD-10-CM | POA: Diagnosis not present

## 2019-02-25 DIAGNOSIS — H35033 Hypertensive retinopathy, bilateral: Secondary | ICD-10-CM | POA: Diagnosis not present

## 2019-02-25 DIAGNOSIS — H35363 Drusen (degenerative) of macula, bilateral: Secondary | ICD-10-CM | POA: Diagnosis not present

## 2019-02-25 DIAGNOSIS — H2511 Age-related nuclear cataract, right eye: Secondary | ICD-10-CM | POA: Diagnosis not present

## 2019-02-25 DIAGNOSIS — H2513 Age-related nuclear cataract, bilateral: Secondary | ICD-10-CM | POA: Diagnosis not present

## 2019-02-25 DIAGNOSIS — H25013 Cortical age-related cataract, bilateral: Secondary | ICD-10-CM | POA: Diagnosis not present

## 2019-02-25 LAB — HM DIABETES EYE EXAM

## 2019-03-03 ENCOUNTER — Other Ambulatory Visit: Payer: Self-pay

## 2019-03-03 ENCOUNTER — Ambulatory Visit (INDEPENDENT_AMBULATORY_CARE_PROVIDER_SITE_OTHER)
Admission: RE | Admit: 2019-03-03 | Discharge: 2019-03-03 | Disposition: A | Discharge status: Home / Self Care | Payer: Medicare HMO | Source: Ambulatory Visit | Attending: Acute Care | Admitting: Acute Care

## 2019-03-03 DIAGNOSIS — R911 Solitary pulmonary nodule: Secondary | ICD-10-CM

## 2019-03-03 DIAGNOSIS — J432 Centrilobular emphysema: Secondary | ICD-10-CM | POA: Diagnosis not present

## 2019-03-03 DIAGNOSIS — Z122 Encounter for screening for malignant neoplasm of respiratory organs: Secondary | ICD-10-CM

## 2019-03-04 ENCOUNTER — Other Ambulatory Visit: Payer: Self-pay | Admitting: *Deleted

## 2019-03-04 DIAGNOSIS — Z87891 Personal history of nicotine dependence: Secondary | ICD-10-CM

## 2019-03-04 DIAGNOSIS — Z122 Encounter for screening for malignant neoplasm of respiratory organs: Secondary | ICD-10-CM

## 2019-03-06 DIAGNOSIS — J441 Chronic obstructive pulmonary disease with (acute) exacerbation: Secondary | ICD-10-CM | POA: Diagnosis not present

## 2019-03-06 DIAGNOSIS — J449 Chronic obstructive pulmonary disease, unspecified: Secondary | ICD-10-CM | POA: Diagnosis not present

## 2019-03-11 DIAGNOSIS — H00022 Hordeolum internum right lower eyelid: Secondary | ICD-10-CM | POA: Diagnosis not present

## 2019-03-16 DIAGNOSIS — H2511 Age-related nuclear cataract, right eye: Secondary | ICD-10-CM | POA: Diagnosis not present

## 2019-03-16 DIAGNOSIS — H25811 Combined forms of age-related cataract, right eye: Secondary | ICD-10-CM | POA: Diagnosis not present

## 2019-03-16 DIAGNOSIS — H52221 Regular astigmatism, right eye: Secondary | ICD-10-CM | POA: Diagnosis not present

## 2019-03-17 ENCOUNTER — Telehealth: Payer: Self-pay | Admitting: Pulmonary Disease

## 2019-03-17 NOTE — Telephone Encounter (Signed)
Called and spoke to pt. Pt states she is waiting for her Anoro to be shipped to her and she just took her last dose today. Pt thought the shipment would have been here today but it has not. Pt was requesting samples to last her until the shipment arrived. I informed pt that we currently do not have Anoro samples and inquired if a 30 day supply needs to be sent to her local pharmacy. Pt denied this as she is currently in the donut hole and paid over $400 for her 3 month supply she is waiting on. Advised in the future we can provide pt assistance forms for her. Pt verbalized understanding and denied any further questions or concerns at this time.

## 2019-03-18 ENCOUNTER — Telehealth: Payer: Self-pay | Admitting: Primary Care

## 2019-03-18 MED ORDER — UMECLIDINIUM-VILANTEROL 62.5-25 MCG/INH IN AEPB: 1.0000 | INHALATION_SPRAY | Freq: Every day | RESPIRATORY_TRACT | 0 refills | Status: AC

## 2019-03-18 NOTE — Telephone Encounter (Signed)
Call returned to patient, she reports her medication has been shipped but it is stuck in transit in [place]. She states she spoke with [place] and they told her they worked out a deal where she can get her supply $110. I made her aware I do not see any documentation regarding this but I would send in the refill and if the pharmacy needed anything from [place] they would contact us. Voiced understanding. Nothing further needed at this time.

## 2019-03-22 DIAGNOSIS — H25012 Cortical age-related cataract, left eye: Secondary | ICD-10-CM | POA: Diagnosis not present

## 2019-03-22 DIAGNOSIS — H2512 Age-related nuclear cataract, left eye: Secondary | ICD-10-CM | POA: Diagnosis not present

## 2019-03-23 ENCOUNTER — Other Ambulatory Visit: Payer: Self-pay | Admitting: Family Medicine

## 2019-03-28 ENCOUNTER — Other Ambulatory Visit: Payer: Self-pay

## 2019-03-28 ENCOUNTER — Ambulatory Visit: Payer: Medicare HMO | Admitting: Pulmonary Disease

## 2019-03-28 ENCOUNTER — Encounter: Payer: Self-pay | Admitting: Pulmonary Disease

## 2019-03-28 VITALS — BP 136/62 | HR 81 | Temp 97.9°F | Ht 63.0 in | Wt 229.6 lb

## 2019-03-28 DIAGNOSIS — J449 Chronic obstructive pulmonary disease, unspecified: Secondary | ICD-10-CM

## 2019-03-28 DIAGNOSIS — J9611 Chronic respiratory failure with hypoxia: Secondary | ICD-10-CM | POA: Diagnosis not present

## 2019-03-28 MED ORDER — ALBUTEROL SULFATE HFA 108 (90 BASE) MCG/ACT IN AERS: 2.0000 | INHALATION_SPRAY | Freq: Four times a day (QID) | RESPIRATORY_TRACT | 5 refills | Status: DC | PRN

## 2019-03-28 MED ORDER — ANORO ELLIPTA 62.5-25 MCG/INH IN AEPB: 1.0000 | INHALATION_SPRAY | Freq: Every day | RESPIRATORY_TRACT | 0 refills | Status: DC

## 2019-03-28 NOTE — Addendum Note (Signed)
Addended by: Amado Coe on: 03/28/2019 11:42 AM   Modules accepted: Orders

## 2019-03-28 NOTE — Progress Notes (Signed)
Subjective:   PATIENT ID: Shannon Schultz GENDER: female DOB: 06-Mar-1946, MRN: [id number]   HPI  Chief Complaint  Patient presents with  . Follow-up   [name] is a 73 year old female with moderate COPD  She has had an eventful year in 2018.  She lost her sister to breast cancer in early 2018, was diagnosed with breast cancer s/p lumpectomy, had bladder cancer and underwent resection and intravesicular chemo, fell and broke her right shoulder, underwent ventral hernia repair with mesh insertion. Last hospitalized for 2 days in April 2019 with COPD exacerbation and lower extremity edema.  Interim History: She was last seen by me in 07/2018 prior to the pandemic. She has previously tolerated Trelegy well but has had several phone calls about de-escalating to Anoro over the summer due to cost of the inhaler. She is tolerating Anoro well. She rarely uses albuterol inhaler. Denies ED/urgent care visits or outpatient exacerbations. She tries to exercise and is currently weight loss program and minimizing carbs. She is not currently wearing oxygen. When she performs activity her O2 nadir is 87-88% but will recover in less than a minute.  Social History: 46 pack years. Quit in 2012.  I have personally reviewed patient's past medical/family/social history/allergies/current medications.  Outpatient Medications Prior to Visit  Medication Sig Dispense Refill  . ACCU-CHEK AVIVA PLUS test strip TEST BLOOD SUGAR ONE TO TWO TIMES DAILY 100 each 2  . acetaminophen (TYLENOL) 500 MG tablet Take 1,000 mg every 6 (six) hours as needed by mouth for moderate pain or headache.     . albuterol (PROVENTIL) (2.5 MG/3ML) 0.083% nebulizer solution USE 1 VIAL BY NEBULIZATION EVERY FOUR HOURS AS NEEDED FOR WHEEZING OR SHORTNESS OF BREATH. 270 mL 0  . albuterol (VENTOLIN HFA) 108 (90 Base) MCG/ACT inhaler Inhale 2 puffs into the lungs every 6 (six) hours as needed for wheezing or shortness of breath. 8 g 5   . aspirin EC 81 MG tablet Take 81 mg daily by mouth.    . bisoprolol-hydrochlorothiazide (ZIAC) 10-6.25 MG tablet TAKE 1 TABLET EVERY DAY 90 tablet 0  . cetirizine (KLS ALLER-TEC) 10 MG tablet Take 10 mg by mouth daily.    . fluticasone (FLONASE) 50 MCG/ACT nasal spray USE 2 SPRAYS IN EACH NOSTRIL EVERY DAY 48 g 0  . furosemide (LASIX) 20 MG tablet TAKE 1 TABLET (20 MG TOTAL) BY MOUTH DAILY AS NEEDED FOR FLUID OR EDEMA. 30 tablet 0  . metFORMIN (GLUCOPHAGE-XR) 500 MG 24 hr tablet TAKE 1 TABLET THREE TIMES DAILY 270 tablet 0  . Multiple Vitamins-Minerals (CENTRUM SILVER PO) Take 1 tablet by mouth daily.     . simvastatin (ZOCOR) 20 MG tablet TAKE 1 TABLET AT BEDTIME 90 tablet 0  . triamcinolone cream (KENALOG) 0.1 % APPLY TO AFFECTED AREA TWICE A DAY    . TRUEplus Lancets 30G MISC TEST BLOOD SUGAR ONE TO TWO TIMES DAILY 200 each 0  . umeclidinium-vilanterol (ANORO ELLIPTA) 62.5-25 MCG/INH AEPB Inhale 1 puff into the lungs daily. 90 each 0  . Calcium Carbonate-Vitamin D (CALCIUM 600+D) 600-400 MG-UNIT per tablet Take 1 tablet by mouth daily.     . Cholecalciferol (EQL VITAMIN D3) 2000 units CAPS Take 4,000 Units daily by mouth.    . Fluticasone-Umeclidin-Vilant (TRELEGY ELLIPTA) 100-62.5-25 MCG/INH AEPB Inhale 1 puff into the lungs daily. (Patient not taking: Reported on 01/20/2019) 60 each 2   No facility-administered medications prior to visit.     Review of Systems  Constitutional: Negative for chills, diaphoresis, fever, malaise/fatigue and weight loss.  HENT: Positive for congestion (allergies). Negative for ear pain and sore throat.   Respiratory: Negative for cough, hemoptysis, sputum production, shortness of breath and wheezing.   Cardiovascular: Negative for chest pain, palpitations and leg swelling.  Gastrointestinal: Negative for abdominal pain, heartburn and nausea.  Genitourinary: Negative for frequency.  Musculoskeletal: Negative for joint pain and myalgias.  Skin: Negative for  itching and rash.  Neurological: Negative for dizziness, weakness and headaches.  Endo/Heme/Allergies: Does not bruise/bleed easily.  Psychiatric/Behavioral: Negative for depression. The patient is not nervous/anxious.     Objective:   Vitals:   03/28/19 0959  BP: 136/62  Pulse: 81  Temp: 97.9 F (36.6 C)  TempSrc: Temporal  SpO2: 92%  Weight: 229 lb 9.6 oz (104.1 kg)  Height: 5\' 3"  (1.6 m)   SpO2: 92 % O2 Device: None (Room air)  Physical Exam: General: Well-appearing, no acute distress HENT: [place], AT Eyes: EOMI, no scleral icterus Respiratory: Clear to auscultation bilaterally.  No crackles, wheezing or rales Cardiovascular: RRR, no murmurs, no JVD GI: BS+, soft, nontender Extremities:-Edema,-tenderness Neuro: AAO x4, CNII-XII grossly intact Skin: Intact, no rashes or bruising Psych: Normal mood, normal affect **NEW  Imaging Screening CT chest 09/10/16 -moderate emphysema Screening CT 09/16/17-moderate emphysema, scattered subcentimeter pulmonary nodules.  Fatty liver with findings of cirrhosis. Screening CT 11/29/18- interval development of RUL nodule measuring 6.1 mm CT Chest 03/03/19 - slightly decreased RUL nodule measuring 5.3 mm  PFTs 08/28/11 FVC 2.71 [92%], FEV1 1.33 (62%), F/F 49, TLC 114%, DLCO 46% Moderate obstructive defect with moderate reduction in diffusion capacity. No bronchodilator response  11/08/15 FVC 2.05 [7%), FEV1 1.23 (55%), F/F 60 Moderate obstructive defect  09/24/16 FVC 2. (82%), FEV1 1.43 (62%), F/F 58 , TLC 99%, DLCO 54% Moderate obstruction with moderate diffusion defect. No bronchodilator response.  Labs A1AT 08/08/16- 162, PIMM  CBC    Component Value Date/Time   WBC 5.1 10/01/2017 0424   RBC 4.60 10/01/2017 0424   HGB 13.0 10/01/2017 0424   HGB 12.5 08/13/2017 1015   HCT 41.0 10/01/2017 0424   HCT 37.5 08/13/2017 1015   PLT 136 (L) 10/01/2017 0424   PLT 171 08/13/2017 1015   MCV 89.1 10/01/2017 0424   MCV 85 08/13/2017  1015   MCH 28.3 10/01/2017 0424   MCHC 31.7 10/01/2017 0424   RDW 16.2 (H) 10/01/2017 0424   RDW 15.7 (H) 08/13/2017 1015   LYMPHSABS 0.7 10/01/2017 0424   LYMPHSABS 1.1 08/13/2017 1015   MONOABS 0.1 10/01/2017 0424   EOSABS 0.0 10/01/2017 0424   EOSABS 0.2 08/13/2017 1015   BASOSABS 0.0 10/01/2017 0424   BASOSABS 0.0 08/13/2017 1015   CMP Latest Ref Rng & Units 09/13/2018 03/11/2018 10/01/2017  Glucose 65 - 99 mg/dL 123(H) 155(H) 278(H)  BUN 8 - 27 mg/dL 19 14 24(H)  Creatinine 0.57 - 1.00 mg/dL 0.70 0.72 0.72  Sodium 134 - 144 mmol/L 143 144 139  Potassium 3.5 - 5.2 mmol/L 4.5 4.7 4.4  Chloride 96 - 106 mmol/L 103 102 104  CO2 20 - 29 mmol/L 25 25 26   Calcium 8.7 - 10.3 mg/dL 9.3 9.7 9.6  Total Protein 6.0 - 8.5 g/dL 6.5 6.4 -  Total Bilirubin 0.0 - 1.2 mg/dL 0.4 0.7 -  Alkaline Phos 39 - 117 IU/L 63 64 -  AST 0 - 40 IU/L 32 36 -  ALT 0 - 32 IU/L 22 25 -   Imaging, labs  and test noted above have been reviewed independently by me.    Assessment & Plan:  73 year old female with well-controlled COPD. Previously on oxygen and advised to wear 24/7 however patient self-discontinued with reported appropriate saturations. Will evaluate overnight oximetry to determine nocturnal oxygen needs.  Moderate COPD GOLD A/B CONTINUE Anoro as instructed REFILL Albuterol. Use as needed  Chronic hypoxemic respiratory failure Obtain overnight pulse oximetry to monitor your oxygen levels at night Wear oxygen if your levels are <88% for over 5 minutes. Please let us know if it does not improve with oxygen  Health maintenance  Immunization History  Administered Date(s) Administered  . Influenza Split 05/28/2011  . Influenza, High Dose Seasonal PF 04/07/2013, 05/15/2014, 05/30/2015, 03/25/2016, 03/05/2017, 04/01/2018  . Influenza, Seasonal, Injecte, Preservative Fre 06/07/2012  . Pneumococcal Conjugate-13 07/06/2014  . Pneumococcal Polysaccharide-23 12/15/2004, 05/28/2011  . Tdap 02/23/2008,  06/15/2018  . Zoster 05/17/2010  . Zoster Recombinat (Shingrix) 12/02/2017, 02/06/2018   Annual CT Lung - Due 02/2020  Orders Placed This Encounter  Procedures  . Pulse oximetry, overnight    Standing Status:   Future    Standing Expiration Date:   03/27/2020   Meds ordered this encounter  Medications  . umeclidinium-vilanterol (ANORO ELLIPTA) 62.5-25 MCG/INH AEPB    Sig: Inhale 1 puff into the lungs daily.    Dispense:  1 each    Refill:  0    Order Specific Question:   Lot Number?    Answer:   [id number]    Order Specific Question:   Expiration Date?    Answer:   03/30/2020    Order Specific Question:   Manufacturer?    Answer:   [employer] [[id number]]    Order Specific Question:   Quantity    Answer:   2   Return in about 6 months (around 09/25/2019).  Corneluis Allston Rodman Pickle, MD [facility] 03/28/2019 11:21 AM  Personal pager: [phone] If unanswered, please page CCM On-call: [phone]

## 2019-03-28 NOTE — Patient Instructions (Addendum)
Moderate COPD GOLD A/B CONTINUE Anoro as instructed REFILL Albuterol. Use as needed  Chronic hypoxemic respiratory failure Obtain overnight pulse oximetry to monitor your oxygen levels at night Wear oxygen if your levels are <88% for over 5 minutes. Please let us know if it does not improve with oxygen  Follow-up in 6 months

## 2019-03-28 NOTE — Addendum Note (Signed)
Addended by: Amado Coe on: 03/28/2019 11:34 AM   Modules accepted: Orders

## 2019-03-30 DIAGNOSIS — H25812 Combined forms of age-related cataract, left eye: Secondary | ICD-10-CM | POA: Diagnosis not present

## 2019-03-30 DIAGNOSIS — H52222 Regular astigmatism, left eye: Secondary | ICD-10-CM | POA: Diagnosis not present

## 2019-03-30 DIAGNOSIS — H2512 Age-related nuclear cataract, left eye: Secondary | ICD-10-CM | POA: Diagnosis not present

## 2019-03-30 DIAGNOSIS — H25012 Cortical age-related cataract, left eye: Secondary | ICD-10-CM | POA: Diagnosis not present

## 2019-03-31 ENCOUNTER — Encounter: Payer: Self-pay | Admitting: Pulmonary Disease

## 2019-04-04 ENCOUNTER — Other Ambulatory Visit: Payer: Self-pay

## 2019-04-04 ENCOUNTER — Other Ambulatory Visit: Payer: Medicare HMO

## 2019-04-04 DIAGNOSIS — E11628 Type 2 diabetes mellitus with other skin complications: Secondary | ICD-10-CM

## 2019-04-04 DIAGNOSIS — Z5181 Encounter for therapeutic drug level monitoring: Secondary | ICD-10-CM | POA: Diagnosis not present

## 2019-04-05 DIAGNOSIS — J9621 Acute and chronic respiratory failure with hypoxia: Secondary | ICD-10-CM | POA: Diagnosis not present

## 2019-04-05 DIAGNOSIS — J449 Chronic obstructive pulmonary disease, unspecified: Secondary | ICD-10-CM | POA: Diagnosis not present

## 2019-04-05 DIAGNOSIS — J441 Chronic obstructive pulmonary disease with (acute) exacerbation: Secondary | ICD-10-CM | POA: Diagnosis not present

## 2019-04-05 LAB — COMPREHENSIVE METABOLIC PANEL
ALT: 19 IU/L (ref 0–32)
AST: 29 IU/L (ref 0–40)
Albumin/Globulin Ratio: 1.8 (ref 1.2–2.2)
Albumin: 4.3 g/dL (ref 3.7–4.7)
Alkaline Phosphatase: 61 IU/L (ref 39–117)
BUN/Creatinine Ratio: 27 (ref 12–28)
BUN: 20 mg/dL (ref 8–27)
Bilirubin Total: 0.6 mg/dL (ref 0.0–1.2)
CO2: 23 mmol/L (ref 20–29)
Calcium: 9.6 mg/dL (ref 8.7–10.3)
Chloride: 102 mmol/L (ref 96–106)
Creatinine, Ser: 0.73 mg/dL (ref 0.57–1.00)
GFR calc Af Amer: 95 mL/min/{1.73_m2} (ref >59)
GFR calc non Af Amer: 83 mL/min/{1.73_m2} (ref >59)
Globulin, Total: 2.4 g/dL (ref 1.5–4.5)
Glucose: 109 mg/dL — ABNORMAL HIGH (ref 65–99)
Potassium: 4.1 mmol/L (ref 3.5–5.2)
Sodium: 142 mmol/L (ref 134–144)
Total Protein: 6.7 g/dL (ref 6.0–8.5)

## 2019-04-05 LAB — CBC WITH DIFFERENTIAL/PLATELET
Basophils Absolute: 0.1 10*3/uL (ref 0.0–0.2)
Basos: 1 %
EOS (ABSOLUTE): 0.1 10*3/uL (ref 0.0–0.4)
Eos: 2 %
Hematocrit: 44 % (ref 34.0–46.6)
Hemoglobin: 14 g/dL (ref 11.1–15.9)
Immature Grans (Abs): 0 10*3/uL (ref 0.0–0.1)
Immature Granulocytes: 0 %
Lymphocytes Absolute: 1 10*3/uL (ref 0.7–3.1)
Lymphs: 21 %
MCH: 29.2 pg (ref 26.6–33.0)
MCHC: 31.8 g/dL (ref 31.5–35.7)
MCV: 92 fL (ref 79–97)
Monocytes Absolute: 0.3 10*3/uL (ref 0.1–0.9)
Monocytes: 6 %
Neutrophils Absolute: 3.4 10*3/uL (ref 1.4–7.0)
Neutrophils: 70 %
Platelets: 116 10*3/uL — ABNORMAL LOW (ref 150–450)
RBC: 4.79 x10E6/uL (ref 3.77–5.28)
RDW: 14.6 % (ref 11.7–15.4)
WBC: 4.8 10*3/uL (ref 3.4–10.8)

## 2019-04-05 LAB — TSH: TSH: 2.6 u[IU]/mL (ref 0.450–4.500)

## 2019-04-05 LAB — HEMOGLOBIN A1C
Est. average glucose Bld gHb Est-mCnc: 108 mg/dL
Hgb A1c MFr Bld: 5.4 % (ref 4.8–5.6)

## 2019-04-06 NOTE — Patient Instructions (Addendum)
  HEALTH MAINTENANCE RECOMMENDATIONS:  It is recommended that you get at least 30 minutes of aerobic exercise at least 5 days/week (for weight loss, you may need as much as 60-90 minutes). This can be any activity that gets your heart rate up. This can be divided in 10-15 minute intervals if needed, but try and build up your endurance at least once a week.  Weight bearing exercise is also recommended twice weekly.  Eat a healthy diet with lots of vegetables, fruits and fiber.  "Colorful" foods have a lot of vitamins (ie green vegetables, tomatoes, red peppers, etc).  Limit sweet tea, regular sodas and alcoholic beverages, all of which has a lot of calories and sugar.  Up to 1 alcoholic drink daily may be beneficial for women (unless trying to lose weight, watch sugars).  Drink a lot of water.  Calcium recommendations are 1200-1500 mg daily (1500 mg for postmenopausal women or women without ovaries), and vitamin D 1000 IU daily.  This should be obtained from diet and/or supplements (vitamins), and calcium should not be taken all at once, but in divided doses.  Monthly self breast exams and yearly mammograms for women over the age of 15 is recommended.  Sunscreen of at least SPF 30 should be used on all sun-exposed parts of the skin when outside between the hours of 10 am and 4 pm (not just when at beach or pool, but even with exercise, golf, tennis, and yard work!)  Use a sunscreen that says "broad spectrum" so it covers both UVA and UVB rays, and make sure to reapply every 1-2 hours.  Remember to change the batteries in your smoke detectors when changing your clock times in the spring and fall.  Use your seat belt every time you are in a car, and please drive safely and not be distracted with cell phones and texting while driving.   Shannon Schultz , Thank you for taking time to come for your Medicare Wellness Visit. I appreciate your ongoing commitment to your health goals. Please review the following  plan we discussed and let me know if I can assist you in the future.    This is a list of the screening recommended for you and due dates:  Health Maintenance  Topic Date Due  . Flu Shot  01/29/2019  . Complete foot exam   03/16/2019  . Hemoglobin A1C  10/03/2019  . Eye exam for diabetics  02/25/2020  . Mammogram  12/11/2019  . Colon Cancer Screening  01/23/2023  . Tetanus Vaccine  06/15/2028  .  Hepatitis C: One time screening is recommended by Center for Disease Control  (CDC) for  adults born from [birth year] through 1965.   Completed  . Pneumonia vaccines  Completed   Flu shot was given today. Diabetic foot exam was done today. Eye exam next due June 2021 (not August as stated above).   We discussed glucosamine and chondroitin supplements (ie Osteo-Biflex) to help with some arthritis pain.

## 2019-04-06 NOTE — Progress Notes (Signed)
Chief Complaint  Patient presents with  . Medicare Wellness    nonfasting AWV/CPE/med check with pelvic. No concerns.   . Flu Vaccine    will give at end of visit.     Shannon Schultz is a 73 y.o. female who presents for annual physical exam, Medicare wellness visit and follow-up on chronic medical conditions.  She has no specific concerns. She does note some congestion and plugging in the right ear.  Taking allergy medication and mucinex, which seems to help.  Per her last visit in July--: "bugs crawling all over her legs and states that they feel like they are burning, sometimes is tingling, and sometimes feels short electrical shocks.  Sometimes it feels like water is running down them (but denies swelling and weeping).  She reports this has been going on for months.   Starts in the shins, but also has some in the calves, and goes down to the ankle, sometimes the top of the foot.  Doesn't feel like her ankles bend well, but denies swelling.  She did have some swelling, took diuretic for 5 days and swelling resolved. Denies any numbness in her feet. Feet are either very cold, or very hot. Feels better with activity (?due to being distracted). Varicose veins--unchanged, nontender. Hasn't been wearing her compression stockings. Ddx included venous stasis vs neuropathy. Given that she had some improvement after diuretic use, still has trace edema.  low Na diet, compression and leg elevation recommended"  She has some continued burning at the shins. No longer feels like bugs crawling or sharp electrical pains.  Just burning at the discolored portions of shins. Uses compression stockings sporadically, doesn't notice much difference.  Not having swelling or needing diuretic.  She has h/o stasis dermatitis, with h/o cellulitis (last in Feb-March).  She has a prescription cream (steroid) from her dermatologist.  She reports the area is burning, and is dry and rough to the touch.  Denies swelling,  weeping/drainage.  Diabetes: Blood sugars at home are running93 to <120. Denies hypoglycemia, polydipsia and polyuria. Last yearly eye exam wasin 11/2018, no retinopathy found. She had cataract surgery last month. Patient follows a low sugar diet and checks feet regularly without concerns. Denies numbness, tingling other than intermittent numbness in the 2nd and 3rd toes on the right foot (h/o fractures, didn't heal right, per pt). No skin lesions. Takes metformin TID--this seems to be the most tolerable way to take it (GI side effects if she takes more than one at a time).  Hypertension: Blood pressure at home is 130's/60's.  Denies headaches, dizziness, chest pain. Denies side effects of medications. Compliant with medication.  Slight sinus headache today, relieved by tylenol BP Readings from Last 3 Encounters:  03/28/19 136/62  01/20/19 130/65  10/18/18 132/60   COPD: She is doing well on Anoro (changed from Trelegy), only rarely needs albuterol. Needed it once last week.  She last saw pulmonologist last week.  Overnight oximetry was ordered, to determine whether or not she needs oxygen at night. She no longer uses oxygen (just once a few months ago when she had a spell of vertigo, which ended up being related to her ear).  It doesn't stay on at night, so she doesn't use it. She feels like she sleeps well, denies daytime somnolence or unrefreshed sleep.  She gets CT for screening for lung cancer, last was last month: IMPRESSION: 1. Lung-RADS 2S, benign appearance or behavior. Continue annual screening with low-dose chest CT without contrast  in 12 months. 2. The "S" modifier above refers to potentially clinically significant non lung cancer related findings. Specifically, there is aortic atherosclerosis, in addition to left main and 3 vessel coronary artery disease. Assessment for potential risk factor modification, dietary therapy or pharmacologic therapy may be warranted, if  clinically indicated. 3. Mild diffuse bronchial wall thickening with mild to moderate centrilobular and paraseptal emphysema; imaging findings suggestive of underlying COPD. 4. There are calcifications of the aortic valve and mitral annulus. Echocardiographic correlation for evaluation of potential valvular dysfunction may be warranted if clinically indicated. 5. Hepatic steatosis with evidence of hepatic cirrhosis.  She is under the care of Dr. Marlou Porch, though she plans to see Dr. Recardo Evangelist in December (switching).  She has known moderate aortic stenosis, monitored yearly with echocardiogram, last of which was 05/2018. She had nuclear stress test in 2016, low risk study. She denies syncope, angina, dyspnea.  Hyperlipidemia follow-up: Patient is reportedly following a low-fat, low cholesterol diet. Compliant with simvastatin and denies medication side effects. Lipids were at goal on last check. Lab Results  Component Value Date   CHOL 111 09/13/2018   HDL 46 09/13/2018   LDLCALC 45 09/13/2018   TRIG 99 09/13/2018   CHOLHDL 2.4 09/13/2018   Allergies:Using flonase nightly, zyrtec, and mucinex as needed (needing recently due to some congestion). No nosebleeds (had issues in the past, resolved after aiming spray to the outside of the nose.)  She is s/p L lumpectomy in 11/2016 for atypical ductal and atypical lobular hyperplasia.  She last saw Dr. Lindi Adie (onc) 02/16/17. No additional treatments were recommended, no follow-up needed.  H/o bladder cancer--lesion in bladder was noted incidentally on CT 11/2016 prior to hernia surgery.She had bladder mass removed by urology in 12/2016, and pathology showedLOW GRADE PAPILLARY UROTHELIAL CARCINOMA She was treated with intravesicular post-op chemo. She last had surveillance cystoscopy in 04/2018.  6 month f/u was recommended. She reports she had another cystoscopy in June which was clear, and to f/u in 1 year (no notes received from June)  She  previously took valtrex preventatively for HSV flare on hip. Stopped taking this a year ago. She only had 1 flare about a month ago, it was mild and she didn't even take valtrex (still has some at home).  Right rotator cuff tear--seen on MRI 06/2017.  Was scheduled for surgery, but was delayed to due to dental issues, then hernia repair and cellulitis. Her shoulder pain had improved, did well until another injury, had cortisone injection and PT (through Dr. Rhona Raider).    Shoulder has improved, has pain sleeping in her bed (no problem on the couch) and with certain positions, but she can manage.  Not planning on surgery unless it gets worse.  H/obilateraltrochanteric bursitis--last treated with cortisone injection into the right trochanteric bursa in October 2019. She didn't get much relief. She can find a comfortable position on her couch now (sleeping in bed bothers both her hip and her shoulder). Husband snores and watches TV, so she doesn't mind sleeping on the couch.   Immunization History  Administered Date(s) Administered  . Influenza Split 05/28/2011  . Influenza, High Dose Seasonal PF 04/07/2013, 05/15/2014, 05/30/2015, 03/25/2016, 03/05/2017, 04/01/2018  . Influenza, Seasonal, Injecte, Preservative Fre 06/07/2012  . Pneumococcal Conjugate-13 07/06/2014  . Pneumococcal Polysaccharide-23 12/15/2004, 05/28/2011  . Tdap 02/23/2008, 06/15/2018  . Zoster 05/17/2010  . Zoster Recombinat (Shingrix) 12/02/2017, 02/06/2018   Last Pap smear: 02/2017--normal, no high risk HPV detected Last mammogram:11/2018 Last colonoscopy: 12/2015 with  Dr. Mann--diverticulosis and internal hemorrhoids. F/U recommend 7 years (12/2022) Last DEXA: 12/2014 normal Dentist: twice a year Ophtho: yearly (at least) Exercise: none currently  Depression screen: negative Fall screen: negative Function status screen: slight short-term memory problems (intermittent), trouble with tall stairs only. Mini-Cog screen:  normal (score of 5)  Dermatologist--Dr. Durward Fortes Dentist--Dr. Kalman Shan Ophtho--Dr. Kathlen Mody GI--Dr. Collene Mares Cardiologist: Dr. Deanne Coffer to Dr. Recardo Evangelist Oncologist: Dr. Lindi Adie Surgeon: Dr. Marlou Starks Urologist: Dr. Alinda Money Pulmonary: Dr. [name] Ortho: Dr. Rhona Raider  End of Life Discussion: Patient hasa living will and medical power of attorney, scanned in chart.  Past Medical History:  Diagnosis Date  . Aortic stenosis    mild-mod by 04/08/16 echo; mod AS 04/2017  . Arthritis    "hands" (05/15/2017)  . Bladder cancer ([place]) 2018  . Breast cancer, right ([place]) 1992   DCIS,bladder ca (just dx)  . Colon polyp   . Complication of anesthesia 1992   "local anesthesia" used was hard to awaken from-no problems since (05/15/2017)  . COPD (chronic obstructive pulmonary disease) ([place])   . Diverticulosis   . Dyspnea   . Elevated cholesterol   . Elevated liver enzymes    fatty liver per ultrasound per pt  . Environmental allergies    "I have allergies year round" (05/15/2017)  . Family history of malignant neoplasm of breast   . FHx: BRCA2 gene positive    sister with BRCA2 mutation (pt tested NEGATIVE)  . Genital HSV    gets on hip  . Heart murmur     echo 05/2011 mild aortic stenosis  . HSV (herpes simplex virus) infection    on hip--on daily suppression  . Hypertension   . Hypothyroidism    took med 7 yrs after birth of 1st child  . Impaired glucose tolerance   . Migraine   . On home oxygen therapy    "have it available but I'm not using it" (05/15/2017)  . Osteopenia   . Pneumonia ~ 1950; 08/06/2010  . Type 2 diabetes mellitus ([place])   . Ventral hernia   . Vitamin D deficiency disease     Past Surgical History:  Procedure Laterality Date  . ABDOMINAL HERNIA REPAIR  05/15/2017  . BREAST BIOPSY Right 1992  . BREAST BIOPSY Left 2018  . BREAST EXCISIONAL BIOPSY Left 2018   ATYPICAL DUCTAL HYPERPLASIA INVOLVING A COMPLEX  . BREAST LUMPECTOMY WITH RADIOACTIVE SEED  LOCALIZATION Left 12/22/2016   Procedure: LEFT BREAST LUMPECTOMY WITH RADIOACTIVE SEED LOCALIZATION;  Surgeon: Jovita Kussmaul, MD;  Location: [facility];  Service: General;  Laterality: Left;  . CATARACT EXTRACTION, BILATERAL Bilateral R 03/16/2019 L 03/30/2019   Dr. Kathlen Mody  . COLONOSCOPY  2009, 08/2010, 12/2015   Dr. Collene Mares; "only 1 had any polyps" (05/15/2017)  . CYSTOSCOPY  04/23/2017  . CYSTOSCOPY W/ RETROGRADES Bilateral 01/12/2017   Procedure: CYSTOSCOPY WITH RETROGRADE PYELOGRAM/ EXAM UNDER ANESTHESIA;  Surgeon: Raynelle Bring, MD;  Location: [facility];  Service: Urology;  Laterality: Bilateral;  . HERNIA REPAIR    . INSERTION OF MESH N/A 05/15/2017   Procedure: INSERTION OF MESH;  Surgeon: Jovita Kussmaul, MD;  Location: [facility];  Service: General;  Laterality: N/A;  . LAPAROSCOPIC CHOLECYSTECTOMY  2003  . MASTECTOMY Right 1992  . TRANSURETHRAL RESECTION OF BLADDER TUMOR WITH MITOMYCIN-C N/A 01/12/2017   Procedure: TRANSURETHRAL RESECTION OF BLADDER TUMOR WITH POSSIBLE POST OPERATIVE INSTILLATION OF MITOMYCIN-C;  Surgeon: Raynelle Bring, MD;  Location: [facility];  Service: Urology;  Laterality: N/A;  .  TYMPANOSTOMY TUBE PLACEMENT Bilateral 1980s  . UMBILICAL HERNIA REPAIR  2003  . VENTRAL HERNIA REPAIR N/A 05/15/2017   Procedure: VENTRAL HERNIA REPAIR WITH MESH;  Surgeon: Jovita Kussmaul, MD;  Location: [facility];  Service: General;  Laterality: N/A;    Social History   Socioeconomic History  . Marital status: Married    Spouse name: Not on file  . Number of children: 3  . Years of education: Not on file  . Highest education level: Not on file  Occupational History  . Occupation: Retired  Scientific laboratory technician  . Financial resource strain: Not on file  . Food insecurity    Worry: Not on file    Inability: Not on file  . Transportation needs    Medical: Not on file    Non-medical: Not on file  Tobacco Use  . Smoking status: Former Smoker    Packs/day: 1.00    Years: 46.00    Pack years: 46.00     Types: Cigarettes    Quit date: 05/31/2011    Years since quitting: 7.8  . Smokeless tobacco: Never Used  Substance and Sexual Activity  . Alcohol use: No  . Drug use: No  . Sexual activity: Yes    Partners: Male    Birth control/protection: Post-menopausal  Lifestyle  . Physical activity    Days per week: Not on file    Minutes per session: Not on file  . Stress: Not on file  Relationships  . Social Herbalist on phone: Not on file    Gets together: Not on file    Attends religious service: Not on file    Active member of club or organization: Not on file    Attends meetings of clubs or organizations: Not on file    Relationship status: Not on file  . Intimate partner violence    Fear of current or ex partner: Not on file    Emotionally abused: Not on file    Physically abused: Not on file    Forced sexual activity: Not on file  Other Topics Concern  . Not on file  Social History Narrative   Lives with her husband.  Children all live in [place] nearby. No pets. 6 grandchildren.    Family History  Problem Relation Age of Onset  . Heart disease Father   . Diabetes Father   . Hypertension Father   . Asthma Sister   . Allergies Sister   . Hyperlipidemia Sister   . Hashimoto's thyroiditis Sister   . Fibromyalgia Sister   . Heart disease Mother        tachycardia  . Asthma Sister   . Allergies Sister   . Hyperlipidemia Sister   . Hashimoto's thyroiditis Sister   . Fibromyalgia Sister   . Breast cancer Sister 68       BRCA2 positive; metastatic to bones at age 47, then spread to liver  . Cancer Other        female cancers, bone cancer  . Cancer Maternal Grandmother        deceased 52; unk. primary; possibly stomach  . Tuberculosis Maternal Grandfather   . Stroke Paternal Grandmother 20       died of cerebral hemorrhage    Outpatient Encounter Medications as of 04/07/2019  Medication Sig  . ACCU-CHEK AVIVA PLUS test strip TEST BLOOD SUGAR ONE TO TWO TIMES  DAILY  . acetaminophen (TYLENOL) 500 MG tablet Take 1,000 mg every 6 (six)  hours as needed by mouth for moderate pain or headache.   . albuterol (VENTOLIN HFA) 108 (90 Base) MCG/ACT inhaler Inhale 2 puffs into the lungs every 6 (six) hours as needed for wheezing or shortness of breath.  Marland Kitchen aspirin EC 81 MG tablet Take 81 mg daily by mouth.  . bisoprolol-hydrochlorothiazide (ZIAC) 10-6.25 MG tablet TAKE 1 TABLET EVERY DAY  . CALCIUM-MAGNESIUM-VITAMIN D PO Take by mouth.  . cetirizine (KLS ALLER-TEC) 10 MG tablet Take 10 mg by mouth daily.  . fluticasone (FLONASE) 50 MCG/ACT nasal spray USE 2 SPRAYS IN EACH NOSTRIL EVERY DAY  . metFORMIN (GLUCOPHAGE-XR) 500 MG 24 hr tablet TAKE 1 TABLET THREE TIMES DAILY  . Multiple Vitamins-Minerals (CENTRUM SILVER PO) Take 1 tablet by mouth daily.   . simvastatin (ZOCOR) 20 MG tablet TAKE 1 TABLET AT BEDTIME  . triamcinolone cream (KENALOG) 0.1 % APPLY TO AFFECTED AREA TWICE A DAY  . TRUEplus Lancets 30G MISC TEST BLOOD SUGAR ONE TO TWO TIMES DAILY  . umeclidinium-vilanterol (ANORO ELLIPTA) 62.5-25 MCG/INH AEPB Inhale 1 puff into the lungs daily.  Marland Kitchen albuterol (PROVENTIL) (2.5 MG/3ML) 0.083% nebulizer solution USE 1 VIAL BY NEBULIZATION EVERY FOUR HOURS AS NEEDED FOR WHEEZING OR SHORTNESS OF BREATH. (Patient not taking: Reported on 04/07/2019)  . furosemide (LASIX) 20 MG tablet TAKE 1 TABLET (20 MG TOTAL) BY MOUTH DAILY AS NEEDED FOR FLUID OR EDEMA. (Patient not taking: Reported on 04/07/2019)  . [DISCONTINUED] umeclidinium-vilanterol (ANORO ELLIPTA) 62.5-25 MCG/INH AEPB Inhale 1 puff into the lungs daily.   No facility-administered encounter medications on file as of 04/07/2019.     Allergies  Allergen Reactions  . Contrast Media [Iodinated Diagnostic Agents] Anaphylaxis, Shortness Of Breath and Other (See Comments)    Could not breath  . Iohexol Anaphylaxis, Shortness Of Breath and Other (See Comments)    Immediately could not breathe  . Lisinopril  Anaphylaxis, Shortness Of Breath and Rash  . Sulfa Antibiotics Anaphylaxis and Other (See Comments)    Historical from mother, pt states that mother says she almost died from this drug  . Latex Other (See Comments)    Unless against on skin for a long time, blisters. Short term is okay.  . Codeine Nausea Only, Anxiety and Other (See Comments)    insomnia  . Levofloxacin Other (See Comments)    insomnia  . Lipitor [Atorvastatin Calcium] Rash  . Vicodin [Hydrocodone-Acetaminophen] Itching    ROS: The patient denies anorexia, fever, vision changes (improved since cataract surgery), decreased hearing, ear pain, sore throat, breast concerns, chest pain, palpitations, dizziness, syncope, cough, swelling, nausea, vomiting, constipation, abdominal pain, melena, hematochezia, indigestion/heartburn, hematuria, incontinence, dysuria, vaginal bleeding, discharge, odor or itch, genital lesions,numbness, tingling, weakness, tremor, suspicious skin lesions, depression, anxiety, abnormal bleeding/bruising, or enlarged lymph nodes. Some chronic mild allergies. Diarrhea from metformin resolved by taking TID Some hand pain from arthritis, mostly in the thumbs Some tingling in right 2nd and 3rd toes, chronic/stable. R shoulder pain, tolerable Bursitis bilateral hips--no pain with walking, just to lay on it     PHYSICAL EXAM:  BP 120/70   Pulse 72   Temp 97.8 F (36.6 C) (Other (Comment))   Ht '5\' 3"'  (1.6 m)   Wt 225 lb (102.1 kg)   LMP  (LMP Unknown)   BMI 39.86 kg/m   Wt Readings from Last 3 Encounters:  04/07/19 225 lb (102.1 kg)  03/28/19 229 lb 9.6 oz (104.1 kg)  01/20/19 244 lb (110.7 kg)    General Appearance:  Alert, cooperative, no distress, appears stated age.   Head:  Normocephalic, without obvious abnormality, atraumatic   Eyes:  PERRL, conjunctiva/corneas clear, EOM's intact, fundi benign.   Ears:  Normal TM's and external ear canals   Nose:  Not examined, wearing mask  due to COVID-19 pandemic  Throat:  Not examined, wearing mask due to COVID-19 pandemic   Neck:  Supple, no lymphadenopathy; thyroid: no enlargement/ tenderness/nodules; no carotid bruit or JVD   Back:  Spine nontender, no curvature, ROM normal, no CVA tenderness   Lungs:  Clear to auscultation bilaterally without wheezes, rales or ronchi; respirations unlabored   Chest Wall:  No tenderness ormass. Absence of R breast, WHSS.  Heart:  Regular rate and rhythm, S1 and S2 normal, no rub or gallop. 3/6 SEM loudest at RUSB, and radiates into R>Lcarotid   Breast Exam:  No tenderness, masses, or nipple discharge or inversion of L breast.WHSSalong superior aspect of areola.No axillary lymphadenopathy. Right breast is absent.   Abdomen:  Soft, non-tender, nondistended, normoactive bowel sounds, no masses, no hepatosplenomegaly. + abdominal obesity.WHSS  Genitalia:  Normal external genitalia without lesions, mild atrophic changes. BUS and vagina normal; No cervical motion tenderness. No abnormal vaginal discharge. Uterus and adnexa not enlarged, nontender, no masses, although exam is limited by body habitus. Pap not performed   Rectal:  Normal sphincter tone, no mass. Heme negative stool  Extremities:  Trace edema at ankles only. Normal diabetic foot exam. Tender at right trochanteric bursa  Pulses:  2+ and symmetric all extremities   Skin:  Skin color, texture, turgor normal. Venous stasis changes bilateral lower legs with--hyperpigmented diffusely, with some slight hypopigmentation/pink centrally bilaterally; only one very small area that is slightly dry at left shin, otherwise is smooth, with no dermatitis.  There is prominent superficial veins in feet giving bluish discoloration, L>R. Prominent vein right shin. Normal sensation to monofilament.  Lymph nodes:  Cervical, supraclavicular, and axillary nodes normal   Neurologic:  Normal strength, sensation and gait; reflexes 2+  and symmetric throughout    Psych: Normal mood, affect, hygiene and grooming   Lab Results  Component Value Date   HGBA1C 5.4 04/04/2019   Fasting glucose 103  Lab Results  Component Value Date   WBC 4.8 04/04/2019   HGB 14.0 04/04/2019   HCT 44.0 04/04/2019   MCV 92 04/04/2019   PLT 116 (L) 04/04/2019   Lab Results  Component Value Date   TSH 2.600 04/04/2019     ASSESSMENT/PLAN:  Annual physical exam  Medicare annual wellness visit, subsequent  COPD with hypoxia ([place] [facility]) - stable on current regimen.  overnight oximetry ordered by pulm, using oxygen only rarely, prn  Venous insufficiency - encouraged regular use of compression stockings/socks  Essential hypertension, benign - controlled  Pure hypercholesterolemia - at goal per last check, cont statin - Plan: simvastatin (ZOCOR) 20 MG tablet, Lipid panel  Need for influenza vaccination - Plan: Flu Vaccine QUAD High Dose(Fluad)  Aortic atherosclerosis ([facility]) - cont current regimen (statin, aspirin, good BP and DM control)  Cirrhosis of liver without ascites, unspecified hepatic cirrhosis type ([facility]) - stable, cont to monitor liver function. Continued weight loss encouraged - Plan: Comprehensive metabolic panel, Protime-INR, APTT  Thrombocytopenia ([facility]) - mild, asymptomatic - Plan: CBC with Differential/Platelet  Personal history of bladder cancer - continue surveillance by urology, clear cysto in June per pt  Type 2 diabetes mellitus with complications ([place]) - well controlled, tolerating current regimen so will leave the same - Plan:  Hemoglobin A1c, Microalbumin / creatinine urine ratio  Seasonal allergic rhinitis, unspecified trigger  Type 2 diabetes mellitus without complication, without long-term current use of insulin ([facility]) - well controlled on metformin - Plan: metFORMIN (GLUCOPHAGE-XR) 500 MG 24 hr tablet, TRUEplus Lancets 30G MISC  Medication monitoring encounter - Plan: Hemoglobin A1c,  Comprehensive metabolic panel, Lipid panel   F/u 6 months with labs prior--CBC, PT/INR, c-met, A1c, lipids, urine microalb  Discussed monthly self breast exams and yearly mammograms; at least 30 minutes of aerobic activity at least 5 days/week and weight-bearing exercise 2x/week; proper sunscreen use reviewed; healthy diet, including goals of calcium and vitamin D intake and alcohol recommendations (less than or equal to 1 drink/day) reviewed; regular seatbelt use; changing batteries in smoke detectors. Immunization recommendations discussed-- high dose flu shot given today.Colonoscopy recommendations reviewed, UTD--f/u withDr. Collene Mares due 12/2022.  Full Code, Full Care MOST form reviewed and updated  Medicare Attestation I have personally reviewed: The patient's medical and social history Their use of alcohol, tobacco or illicit drugs Their current medications and supplements The patient's functional ability including ADLs,fall risks, home safety risks, cognitive, and hearing and visual impairment Diet and physical activities Evidence for depression or mood disorders  The patient's weight, height, BMI, and visual acuity have been recorded in the chart.  I have made referrals, counseling, and provided education to the patient based on review of the above and I have provided the patient with a written personalized care plan for preventive services.

## 2019-04-07 ENCOUNTER — Other Ambulatory Visit: Payer: Self-pay

## 2019-04-07 ENCOUNTER — Ambulatory Visit (INDEPENDENT_AMBULATORY_CARE_PROVIDER_SITE_OTHER): Payer: Medicare HMO | Admitting: Family Medicine

## 2019-04-07 ENCOUNTER — Encounter: Payer: Self-pay | Admitting: Family Medicine

## 2019-04-07 VITALS — BP 120/70 | HR 72 | Temp 97.8°F | Ht 63.0 in | Wt 225.0 lb

## 2019-04-07 DIAGNOSIS — Z5181 Encounter for therapeutic drug level monitoring: Secondary | ICD-10-CM

## 2019-04-07 DIAGNOSIS — E7800 Pure hypercholesterolemia, unspecified: Secondary | ICD-10-CM | POA: Diagnosis not present

## 2019-04-07 DIAGNOSIS — E119 Type 2 diabetes mellitus without complications: Secondary | ICD-10-CM

## 2019-04-07 DIAGNOSIS — I10 Essential (primary) hypertension: Secondary | ICD-10-CM | POA: Diagnosis not present

## 2019-04-07 DIAGNOSIS — I700 Atherosclerosis of aorta: Secondary | ICD-10-CM

## 2019-04-07 DIAGNOSIS — Z8551 Personal history of malignant neoplasm of bladder: Secondary | ICD-10-CM

## 2019-04-07 DIAGNOSIS — R0902 Hypoxemia: Secondary | ICD-10-CM

## 2019-04-07 DIAGNOSIS — E118 Type 2 diabetes mellitus with unspecified complications: Secondary | ICD-10-CM

## 2019-04-07 DIAGNOSIS — Z0000 Encounter for general adult medical examination without abnormal findings: Secondary | ICD-10-CM | POA: Diagnosis not present

## 2019-04-07 DIAGNOSIS — J449 Chronic obstructive pulmonary disease, unspecified: Secondary | ICD-10-CM | POA: Diagnosis not present

## 2019-04-07 DIAGNOSIS — Z23 Encounter for immunization: Secondary | ICD-10-CM | POA: Diagnosis not present

## 2019-04-07 DIAGNOSIS — K7460 Unspecified cirrhosis of liver: Secondary | ICD-10-CM

## 2019-04-07 DIAGNOSIS — I872 Venous insufficiency (chronic) (peripheral): Secondary | ICD-10-CM | POA: Diagnosis not present

## 2019-04-07 DIAGNOSIS — D696 Thrombocytopenia, unspecified: Secondary | ICD-10-CM

## 2019-04-07 DIAGNOSIS — J302 Other seasonal allergic rhinitis: Secondary | ICD-10-CM

## 2019-04-07 MED ORDER — TRUEPLUS LANCETS 30G MISC: 0 refills | Status: DC

## 2019-04-07 MED ORDER — SIMVASTATIN 20 MG PO TABS: 20.0000 mg | ORAL_TABLET | Freq: Every day | ORAL | 1 refills | Status: DC

## 2019-04-07 MED ORDER — METFORMIN HCL ER 500 MG PO TB24: 500.0000 mg | ORAL_TABLET | Freq: Three times a day (TID) | ORAL | 1 refills | Status: DC

## 2019-04-09 ENCOUNTER — Encounter: Payer: Self-pay | Admitting: Family Medicine

## 2019-04-11 ENCOUNTER — Telehealth: Payer: Self-pay | Admitting: Pulmonary Disease

## 2019-04-11 DIAGNOSIS — G4734 Idiopathic sleep related nonobstructive alveolar hypoventilation: Secondary | ICD-10-CM

## 2019-04-11 NOTE — Telephone Encounter (Signed)
Reviewed ONO 03/31/19  Time <88% 375.1 min with nadir O2 61%. She had 34 oxygen desaturation events per hour.  Discussed results with patient. Encourage her to wear her oxygen overnight. Discussed evaluation for sleep apnea due to her desaturations. She agrees to home sleep study. This week, her family is arranging a funeral but can pick up test at later time.  Rodman Pickle, M.D. [facility] 04/11/2019 3:46 PM

## 2019-04-12 NOTE — Telephone Encounter (Signed)
Nothing further needed at this time.  Will close out encounter.

## 2019-04-21 ENCOUNTER — Telehealth: Payer: Self-pay | Admitting: Cardiology

## 2019-04-21 NOTE — Telephone Encounter (Signed)
No objections

## 2019-04-21 NOTE — Telephone Encounter (Signed)
New Message      Pt is calling because she wants to change providers from Dr Marlou Porch to Dr Sallyanne Kuster

## 2019-04-21 NOTE — Telephone Encounter (Signed)
OK with me Mark Skains, MD  

## 2019-04-22 ENCOUNTER — Telehealth: Payer: Self-pay | Admitting: Pulmonary Disease

## 2019-04-22 NOTE — Telephone Encounter (Signed)
I had already pulled pt's study to call her.  Called pt & scheduled her for hst.  Nothing further needed.

## 2019-04-23 ENCOUNTER — Other Ambulatory Visit: Payer: Self-pay | Admitting: Primary Care

## 2019-04-25 ENCOUNTER — Encounter: Payer: Self-pay | Admitting: Family Medicine

## 2019-05-03 ENCOUNTER — Other Ambulatory Visit: Payer: Self-pay

## 2019-05-03 ENCOUNTER — Ambulatory Visit: Payer: Medicare HMO

## 2019-05-03 ENCOUNTER — Telehealth: Payer: Self-pay | Admitting: Pulmonary Disease

## 2019-05-03 DIAGNOSIS — G4734 Idiopathic sleep related nonobstructive alveolar hypoventilation: Secondary | ICD-10-CM

## 2019-05-03 DIAGNOSIS — G4733 Obstructive sleep apnea (adult) (pediatric): Secondary | ICD-10-CM | POA: Diagnosis not present

## 2019-05-03 MED ORDER — DOXYCYCLINE HYCLATE 100 MG PO TABS: 100.0000 mg | ORAL_TABLET | Freq: Two times a day (BID) | ORAL | 0 refills | Status: DC

## 2019-05-03 NOTE — Telephone Encounter (Signed)
Called and spoke to pt. Informed her of the recs per BW. Pt verbalized understanding and denied any further questions or concerns at this time.

## 2019-05-03 NOTE — Telephone Encounter (Signed)
Primary Pulmonologist: JE Last office visit and with whom: 03/28/19 with JE What do we see them for (pulmonary problems): COPD Last OV assessment/plan:  "Moderate COPD GOLD A/B CONTINUE Anoro as instructed REFILL Albuterol. Use as needed  Chronic hypoxemic respiratory failure Obtain overnight pulse oximetry to monitor your oxygen levels at night Wear oxygen if your levels are <88% for over 5 minutes. Please let us know if it does not improve with oxygen  Follow-up in 6 months"  Was appointment offered to patient (explain)?  Supposed to be coming in to pick up [place] today at 1430 wanted someone to listen to her chest then Offered video visit   Reason for call: Patient started having a deep non porductive cough Sunday. She doesn't feel like there is anything in her chest, but it burns when she coughs, No fever. Does have a runny nose.  Patient has COPD and doesn't want to get sicker, doesn't know what to do.   BW please advise.

## 2019-05-03 NOTE — Telephone Encounter (Signed)
I'll send in prescription for doxycycline to have on hold. Try mucinex and delsym cough syrup for now and if not improving in 2-3 days take abx.

## 2019-05-05 ENCOUNTER — Other Ambulatory Visit: Payer: Self-pay

## 2019-05-05 DIAGNOSIS — U071 COVID-19: Secondary | ICD-10-CM

## 2019-05-05 DIAGNOSIS — Z20822 Contact with and (suspected) exposure to covid-19: Secondary | ICD-10-CM

## 2019-05-05 HISTORY — DX: COVID-19: U07.1

## 2019-05-06 ENCOUNTER — Ambulatory Visit (INDEPENDENT_AMBULATORY_CARE_PROVIDER_SITE_OTHER): Payer: Medicare HMO | Admitting: Medical

## 2019-05-06 ENCOUNTER — Other Ambulatory Visit: Payer: Self-pay

## 2019-05-06 ENCOUNTER — Encounter: Payer: Self-pay | Admitting: Medical

## 2019-05-06 VITALS — Temp 97.5°F | Ht 64.0 in | Wt 221.0 lb

## 2019-05-06 DIAGNOSIS — Z20828 Contact with and (suspected) exposure to other viral communicable diseases: Secondary | ICD-10-CM | POA: Diagnosis not present

## 2019-05-06 DIAGNOSIS — Z20822 Contact with and (suspected) exposure to covid-19: Secondary | ICD-10-CM | POA: Insufficient documentation

## 2019-05-06 DIAGNOSIS — J449 Chronic obstructive pulmonary disease, unspecified: Secondary | ICD-10-CM | POA: Diagnosis not present

## 2019-05-06 DIAGNOSIS — E119 Type 2 diabetes mellitus without complications: Secondary | ICD-10-CM

## 2019-05-06 DIAGNOSIS — R432 Parageusia: Secondary | ICD-10-CM

## 2019-05-06 DIAGNOSIS — K7460 Unspecified cirrhosis of liver: Secondary | ICD-10-CM

## 2019-05-06 DIAGNOSIS — R69 Illness, unspecified: Secondary | ICD-10-CM

## 2019-05-06 DIAGNOSIS — J441 Chronic obstructive pulmonary disease with (acute) exacerbation: Secondary | ICD-10-CM | POA: Diagnosis not present

## 2019-05-06 MED ORDER — ALBUTEROL SULFATE (2.5 MG/3ML) 0.083% IN NEBU: INHALATION_SOLUTION | RESPIRATORY_TRACT | 0 refills | Status: DC

## 2019-05-06 NOTE — Progress Notes (Signed)
Subjective:     Patient ID: Shannon Schultz, female   DOB: Aug 20, 1945, 73 y.o.   MRN: [id number]  This visit type was conducted due to national recommendations for restrictions regarding the COVID-19 Pandemic (e.g. social distancing) in an effort to limit this patient's exposure and mitigate transmission in our community.  Due to their co-morbid illnesses, this patient is at least at moderate risk for complications without adequate follow up.  This format is felt to be most appropriate for this patient at this time.    Documentation for virtual audio and video telecommunications through Zoom encounter:  The patient was located at home. The provider was located in the office. The patient did consent to this visit and is aware of possible charges through their insurance for this visit.  The other persons participating in this telemedicine service were none. Time spent on call was 15 minutes and in review of previous records >15 minutes total.  This virtual service is not related to other E/M service within previous 7 days.   HPI Chief Complaint  Patient presents with  . COVID EXPOSURE    LOSS OF TEST    Virtual consult for concern about Covid exposure.  She was around her son this past weekend on Sunday about 5 days ago.  Her son started getting sick with cold symptoms by Wednesday.  He got tested for Covid and got his result back positive today.  Mrs. Janoff has had a little bit of nasal congestion but has felt fine until yesterday when she was getting ready to eat a nice looking burger but when she bit into it she noticed absolutely no sense of taste.  She has no other symptoms at the moment other than the nasal congestion. The loss of taste is new as of yesterday.  She went and got a Covid test yesterday at [facility].  She denies fever, wheezing, fatigue, sore throat, body aches, chills.  Her husband just started having fever today and he also went to get tested.   No other sick  contacts.  She is concerned due to her underlying health issues.  She has albuterol at home, has underlying COPD, diabetes, has not normally had to be using albuterol that often.  No other aggravating or relieving factors. No other complaint.  Review of Systems As in subjective    Objective:   Physical Exam Due to coronavirus pandemic stay at home measures, patient visit was virtual and they were not examined in person.   Temp (!) 97.5 F (36.4 C)   Ht 5\' 4"  (1.626 m)   Wt 221 lb (100.2 kg)   LMP  (LMP Unknown)   BMI 37.93 kg/m  She has a pulse oximeter at home and it registered 96%       Assessment:     Encounter Diagnoses  Name Primary?  . Close exposure to COVID-19 virus Yes  . Comorbid condition   . Type 2 diabetes mellitus without complication, without long-term current use of insulin ([place])   . COPD, group B, by GOLD 2017 classification ([place])   . Cirrhosis of liver without ascites, unspecified hepatic cirrhosis type ([place])   . Loss of taste   . Person under investigation for COVID-19        Plan:     We discussed her concerns.  She is awaiting Covid test.  We discussed symptoms and signs of Covid infection.  She certainly has potential for infection given her husband has a fever  and she was around her son who tested positive and has symptoms.  She has multiple underlying health issues that puts her at high risk.  I signed her up for Covid companion so she will get text monitoring the next few days.  She will watch for Covid test result to come in on my chart  We discussed importance of rest, hydration, she can use over-the-counter Tylenol for fever and pain, can use Mucinex or Mucinex DM for cough and congestion, she can use albuterol for wheezing and shortness of breath.  She will monitor her pulse oximeter she has at home for both pulse rate and oxygen for signs of worsening symptoms that may prompt hospital visit.  I refilled her nebulized albuterol since the last  supply she has was old.  Advised if any significant worsening symptoms such as significant shortness of breath, uncontrollable nausea or vomiting, extreme fatigue, high fever, dark urine, dehydration, pulse oximetry less than 93%, pulse rate over 100, or other significant symptoms in the coming days to consider visit to the emergency department  Meeya was seen today for covid exposure.  Diagnoses and all orders for this visit:  Close exposure to COVID-19 virus -     Temperature monitoring; Future  Comorbid condition -     Temperature monitoring; Future  Type 2 diabetes mellitus without complication, without long-term current use of insulin ([facility])  COPD, group B, by GOLD 2017 classification ([place])  Cirrhosis of liver without ascites, unspecified hepatic cirrhosis type ([place])  Loss of taste -     Temperature monitoring; Future  Person under investigation for COVID-19 -     Temperature monitoring; Future  Other orders -     albuterol (PROVENTIL) (2.5 MG/3ML) 0.083% nebulizer solution; USE 1 VIAL BY NEBULIZATION EVERY FOUR HOURS AS NEEDED FOR WHEEZING OR SHORTNESS OF BREATH. -     MYCHART COVID-19 HOME MONITORING PROGRAM

## 2019-05-07 LAB — NOVEL CORONAVIRUS, NAA: SARS-CoV-2, NAA: DETECTED — AB

## 2019-05-09 DIAGNOSIS — G4733 Obstructive sleep apnea (adult) (pediatric): Secondary | ICD-10-CM | POA: Diagnosis not present

## 2019-05-12 ENCOUNTER — Other Ambulatory Visit: Payer: Self-pay

## 2019-05-12 ENCOUNTER — Encounter: Payer: Self-pay | Admitting: Family Medicine

## 2019-05-12 ENCOUNTER — Ambulatory Visit (INDEPENDENT_AMBULATORY_CARE_PROVIDER_SITE_OTHER): Payer: Medicare HMO | Admitting: Family Medicine

## 2019-05-12 VITALS — BP 112/63 | HR 79 | Temp 98.2°F | Ht 64.0 in | Wt 217.0 lb

## 2019-05-12 DIAGNOSIS — U071 COVID-19: Secondary | ICD-10-CM

## 2019-05-12 NOTE — Patient Instructions (Signed)
This information is directly available on the CDC website: RunningShows.co.za.html    Source:CDC Reference to specific commercial products, manufacturers, companies, or trademarks does not constitute its endorsement or recommendation by the [place], [place], or Centers for [employer] and Prevention.   COVID-19 COVID-19 is a respiratory infection that is caused by a virus called severe acute respiratory syndrome coronavirus 2 (SARS-CoV-2). The disease is also known as coronavirus disease or novel coronavirus. In some people, the virus may not cause any symptoms. In others, it may cause a serious infection. The infection can get worse quickly and can lead to complications, such as:  Pneumonia, or infection of the lungs.  Acute respiratory distress syndrome or ARDS. This is fluid build-up in the lungs.  Acute respiratory failure. This is a condition in which there is not enough oxygen passing from the lungs to the body.  Sepsis or septic shock. This is a serious bodily reaction to an infection.  Blood clotting problems.  Secondary infections due to bacteria or fungus. The virus that causes COVID-19 is contagious. This means that it can spread from person to person through droplets from coughs and sneezes (respiratory secretions). What are the causes? This illness is caused by a virus. You may catch the virus by:  Breathing in droplets from an infected person's cough or sneeze.  Touching something, like a table or a doorknob, that was exposed to the virus (contaminated) and then touching your mouth, nose, or eyes. What increases the risk? Risk for infection You are more likely to be infected with this virus if you:  Live in or travel to an area with a COVID-19 outbreak.  Come in contact with a sick person who recently traveled to an area with a COVID-19 outbreak.  Provide care for or  live with a person who is infected with COVID-19. Risk for serious illness You are more likely to become seriously ill from the virus if you:  Are 67 years of age or older.  Have a long-term disease that lowers your body's ability to fight infection (immunocompromised).  Live in a nursing home or long-term care facility.  Have a long-term (chronic) disease such as: ? Chronic lung disease, including chronic obstructive pulmonary disease or asthma ? Heart disease. ? Diabetes. ? Chronic kidney disease. ? Liver disease.  Are obese. What are the signs or symptoms? Symptoms of this condition can range from mild to severe. Symptoms may appear any time from 2 to 14 days after being exposed to the virus. They include:  A fever.  A cough.  Difficulty breathing.  Chills.  Muscle pains.  A sore throat.  Loss of taste or smell. Some people may also have stomach problems, such as nausea, vomiting, or diarrhea. Other people may not have any symptoms of COVID-19. How is this diagnosed? This condition may be diagnosed based on:  Your signs and symptoms, especially if: ? You live in an area with a COVID-19 outbreak. ? You recently traveled to or from an area where the virus is common. ? You provide care for or live with a person who was diagnosed with COVID-19.  A physical exam.  Lab tests, which may include: ? A nasal swab to take a sample of fluid from your nose. ? A throat swab to take a sample of fluid from your throat. ? A sample of mucus from your lungs (sputum). ? Blood tests.  Imaging tests, which may include, X-rays, CT scan, or ultrasound.  How is this treated? At present, there is no medicine to treat COVID-19. Medicines that treat other diseases are being used on a trial basis to see if they are effective against COVID-19. Your health care provider will talk with you about ways to treat your symptoms. For most people, the infection is mild and can be managed at home  with rest, fluids, and over-the-counter medicines. Treatment for a serious infection usually takes places in a hospital intensive care unit (ICU). It may include one or more of the following treatments. These treatments are given until your symptoms improve.  Receiving fluids and medicines through an IV.  Supplemental oxygen. Extra oxygen is given through a tube in the nose, a face mask, or a hood.  Positioning you to lie on your stomach (prone position). This makes it easier for oxygen to get into the lungs.  Continuous positive airway pressure (CPAP) or bi-level positive airway pressure (BPAP) machine. This treatment uses mild air pressure to keep the airways open. A tube that is connected to a motor delivers oxygen to the body.  Ventilator. This treatment moves air into and out of the lungs by using a tube that is placed in your windpipe.  Tracheostomy. This is a procedure to create a hole in the neck so that a breathing tube can be inserted.  Extracorporeal membrane oxygenation (ECMO). This procedure gives the lungs a chance to recover by taking over the functions of the heart and lungs. It supplies oxygen to the body and removes carbon dioxide. Follow these instructions at home: Lifestyle  If you are sick, stay home except to get medical care. Your health care provider will tell you how long to stay home. Call your health care provider before you go for medical care.  Rest at home as told by your health care provider.  Do not use any products that contain nicotine or tobacco, such as cigarettes, e-cigarettes, and chewing tobacco. If you need help quitting, ask your health care provider.  Return to your normal activities as told by your health care provider. Ask your health care provider what activities are safe for you. General instructions  Take over-the-counter and prescription medicines only as told by your health care provider.  Drink enough fluid to keep your urine pale  yellow.  Keep all follow-up visits as told by your health care provider. This is important. How is this prevented?  There is no vaccine to help prevent COVID-19 infection. However, there are steps you can take to protect yourself and others from this virus. To protect yourself:   Do not travel to areas where COVID-19 is a risk. The areas where COVID-19 is reported change often. To identify high-risk areas and travel restrictions, check the CDC travel website: FatFares.com.br  If you live in, or must travel to, an area where COVID-19 is a risk, take precautions to avoid infection. ? Stay away from people who are sick. ? Wash your hands often with soap and water for 20 seconds. If soap and water are not available, use an alcohol-based hand sanitizer. ? Avoid touching your mouth, face, eyes, or nose. ? Avoid going out in public, follow guidance from your state and local health authorities. ? If you must go out in public, wear a cloth face covering or face mask. ? Disinfect objects and surfaces that are frequently touched every day. This may include:  Counters and tables.  Doorknobs and light switches.  Sinks and faucets.  Electronics, such as phones, remote controls,  keyboards, computers, and tablets. To protect others: If you have symptoms of COVID-19, take steps to prevent the virus from spreading to others.  If you think you have a COVID-19 infection, contact your health care provider right away. Tell your health care team that you think you may have a COVID-19 infection.  Stay home. Leave your house only to seek medical care. Do not use public transport.  Do not travel while you are sick.  Wash your hands often with soap and water for 20 seconds. If soap and water are not available, use alcohol-based hand sanitizer.  Stay away from other members of your household. Let healthy household members care for children and pets, if possible. If you have to care for children  or pets, wash your hands often and wear a mask. If possible, stay in your own room, separate from others. Use a different bathroom.  Make sure that all people in your household wash their hands well and often.  Cough or sneeze into a tissue or your sleeve or elbow. Do not cough or sneeze into your hand or into the air.  Wear a cloth face covering or face mask. Where to find more information  Centers for Disease Control and Prevention: PurpleGadgets.be  World Health Organization: https://www.castaneda.info/ Contact a health care provider if:  You live in or have traveled to an area where COVID-19 is a risk and you have symptoms of the infection.  You have had contact with someone who has COVID-19 and you have symptoms of the infection. Get help right away if:  You have trouble breathing.  You have pain or pressure in your chest.  You have confusion.  You have bluish lips and fingernails.  You have difficulty waking from sleep.  You have symptoms that get worse. These symptoms may represent a serious problem that is an emergency. Do not wait to see if the symptoms will go away. Get medical help right away. Call your local emergency services (911 in the [place] Do not drive yourself to the hospital. Let the emergency medical personnel know if you think you have COVID-19. Summary  COVID-19 is a respiratory infection that is caused by a virus. It is also known as coronavirus disease or novel coronavirus. It can cause serious infections, such as pneumonia, acute respiratory distress syndrome, acute respiratory failure, or sepsis.  The virus that causes COVID-19 is contagious. This means that it can spread from person to person through droplets from coughs and sneezes.  You are more likely to develop a serious illness if you are 75 years of age or older, have a weak immunity, live in a nursing home, or have chronic disease.  There is no medicine to  treat COVID-19. Your health care provider will talk with you about ways to treat your symptoms.  Take steps to protect yourself and others from infection. Wash your hands often and disinfect objects and surfaces that are frequently touched every day. Stay away from people who are sick and wear a mask if you are sick. This information is not intended to replace advice given to you by your health care provider. Make sure you discuss any questions you have with your health care provider. Document Released: 07/22/2018 Document Revised: 11/11/2018 Document Reviewed: 07/22/2018 Elsevier Patient Education  2020 [employer] Under Monitoring Name: Shannon Schultz  Location: [place] [address]   Infection Prevention Recommendations for Individuals Confirmed to have, or Being Evaluated for, 2019  Novel Coronavirus (COVID-19) Infection Who Receive Care at Home  Individuals who are confirmed to have, or are being evaluated for, COVID-19 should follow the prevention steps below until a healthcare provider or local or state health department says they can return to normal activities.  Stay home except to get medical care You should restrict activities outside your home, except for getting medical care. Do not go to work, school, or public areas, and do not use public transportation or taxis.  Call ahead before visiting your doctor Before your medical appointment, call the healthcare provider and tell them that you have, or are being evaluated for, COVID-19 infection. This will help the healthcare provider's office take steps to keep other people from getting infected. Ask your healthcare provider to call the local or state health department.  Monitor your symptoms Seek prompt medical attention if your illness is worsening (e.g., difficulty breathing). Before going to your medical appointment, call the healthcare provider and tell them that you have, or are being evaluated  for, COVID-19 infection. Ask your healthcare provider to call the local or state health department.  Wear a facemask You should wear a facemask that covers your nose and mouth when you are in the same room with other people and when you visit a healthcare provider. People who live with or visit you should also wear a facemask while they are in the same room with you.  Separate yourself from other people in your home As much as possible, you should stay in a different room from other people in your home. Also, you should use a separate bathroom, if available.  Avoid sharing household items You should not share dishes, drinking glasses, cups, eating utensils, towels, bedding, or other items with other people in your home. After using these items, you should wash them thoroughly with soap and water.  Cover your coughs and sneezes Cover your mouth and nose with a tissue when you cough or sneeze, or you can cough or sneeze into your sleeve. Throw used tissues in a lined trash can, and immediately wash your hands with soap and water for at least 20 seconds or use an alcohol-based hand rub.  Wash your [employer] [employer] your hands often and thoroughly with soap and water for at least 20 seconds. You can use an alcohol-based hand sanitizer if soap and water are not available and if your hands are not visibly dirty. Avoid touching your eyes, nose, and mouth with unwashed hands.   Prevention Steps for Caregivers and Household Members of Individuals Confirmed to have, or Being Evaluated for, COVID-19 Infection Being Cared for in the Home  If you live with, or provide care at home for, a person confirmed to have, or being evaluated for, COVID-19 infection please follow these guidelines to prevent infection:  Follow healthcare provider's instructions Make sure that you understand and can help the patient follow any healthcare provider instructions for all care.  Provide for the patient's basic needs  You should help the patient with basic needs in the home and provide support for getting groceries, prescriptions, and other personal needs.  Monitor the patient's symptoms If they are getting sicker, call his or her medical provider and tell them that the patient has, or is being evaluated for, COVID-19 infection. This will help the healthcare provider's office take steps to keep other people from getting infected. Ask the healthcare provider to call the local or state health department.  Limit the number of people who have contact with  the patient  If possible, have only one caregiver for the patient.  Other household members should stay in another home or place of residence. If this is not possible, they should stay  in another room, or be separated from the patient as much as possible. Use a separate bathroom, if available.  Restrict visitors who do not have an essential need to be in the home.  Keep older adults, very young children, and other sick people away from the patient Keep older adults, very young children, and those who have compromised immune systems or chronic health conditions away from the patient. This includes people with chronic heart, lung, or kidney conditions, diabetes, and cancer.  Ensure good ventilation Make sure that shared spaces in the home have good air flow, such as from an air conditioner or an opened window, weather permitting.  Wash your hands often  Wash your hands often and thoroughly with soap and water for at least 20 seconds. You can use an alcohol based hand sanitizer if soap and water are not available and if your hands are not visibly dirty.  Avoid touching your eyes, nose, and mouth with unwashed hands.  Use disposable paper towels to dry your hands. If not available, use dedicated cloth towels and replace them when they become wet.  Wear a facemask and gloves  Wear a disposable facemask at all times in the room and gloves when you  touch or have contact with the patient's blood, body fluids, and/or secretions or excretions, such as sweat, saliva, sputum, nasal mucus, vomit, urine, or feces.  Ensure the mask fits over your nose and mouth tightly, and do not touch it during use.  Throw out disposable facemasks and gloves after using them. Do not reuse.  Wash your hands immediately after removing your facemask and gloves.  If your personal clothing becomes contaminated, carefully remove clothing and launder. Wash your hands after handling contaminated clothing.  Place all used disposable facemasks, gloves, and other waste in a lined container before disposing them with other household waste.  Remove gloves and wash your hands immediately after handling these items.  Do not share dishes, glasses, or other household items with the patient  Avoid sharing household items. You should not share dishes, drinking glasses, cups, eating utensils, towels, bedding, or other items with a patient who is confirmed to have, or being evaluated for, COVID-19 infection.  After the person uses these items, you should wash them thoroughly with soap and water.  Wash laundry thoroughly  Immediately remove and wash clothes or bedding that have blood, body fluids, and/or secretions or excretions, such as sweat, saliva, sputum, nasal mucus, vomit, urine, or feces, on them.  Wear gloves when handling laundry from the patient.  Read and follow directions on labels of laundry or clothing items and detergent. In general, wash and dry with the warmest temperatures recommended on the label.  Clean all areas the individual has used often  Clean all touchable surfaces, such as counters, tabletops, doorknobs, bathroom fixtures, toilets, phones, keyboards, tablets, and bedside tables, every day. Also, clean any surfaces that may have blood, body fluids, and/or secretions or excretions on them.  Wear gloves when cleaning surfaces the patient has come in  contact with.  Use a diluted bleach solution (e.g., dilute bleach with 1 part bleach and 10 parts water) or a household disinfectant with a label that says EPA-registered for coronaviruses. To make a bleach solution at home, add 1 tablespoon of bleach to 1  quart (4 cups) of water. For a larger supply, add  cup of bleach to 1 gallon (16 cups) of water.  Read labels of cleaning products and follow recommendations provided on product labels. Labels contain instructions for safe and effective use of the cleaning product including precautions you should take when applying the product, such as wearing gloves or eye protection and making sure you have good ventilation during use of the product.  Remove gloves and wash hands immediately after cleaning.  Monitor yourself for signs and symptoms of illness Caregivers and household members are considered close contacts, should monitor their health, and will be asked to limit movement outside of the home to the extent possible. Follow the monitoring steps for close contacts listed on the symptom monitoring form.   ? If you have additional questions, contact your local health department or call the epidemiologist on call at [phone] (available 24/7). ? This guidance is subject to change. For the most up-to-date guidance from [facility], please refer to their website: [email or web address]

## 2019-05-12 NOTE — Progress Notes (Signed)
Start time: 1:25 End time: 1:43  Virtual Visit via Video Note  I connected with Shannon Schultz on 05/12/19 at  1:30 PM EST by a video enabled telemedicine application and verified that I am speaking with the correct person using two identifiers.  Location: Patient: home Provider: office   I discussed the limitations of evaluation and management by telemedicine and the availability of in person appointments. The patient expressed understanding and agreed to proceed.  History of Present Illness:  Chief Complaint  Patient presents with  . Follow-up    VIRTUAL appointment. COVID follow up. Looking for instructions on what to do from here.    She tested + for COVID-19 on 11/5.  She had lost her sense of taste, had exposure to her son who tested positive. She didn't have other symptoms at that time.  She has since lost her sense of smell.  Skin hurts to touch. She had low grade fever for the first time last night (100.2), took some advil (using tylenol otherwise, for the achiness). Denies cough, shortness of breath, nausea, vomiting, diarrhea.  Denies rashes.  Husband also has COVID, is feeling worse than she is. He is having GI side effects (nausea, diarrhea).  Patient states Health Department called her and said she was negative and wouldn't be calling again. She did correct them and state she was positive.  Wasn't given information as far as when to end isolation.  PMH, PSH, SH reviewed  Outpatient Encounter Medications as of 05/12/2019  Medication Sig Note  . ACCU-CHEK AVIVA PLUS test strip TEST BLOOD SUGAR ONE TO TWO TIMES DAILY   . acetaminophen (TYLENOL) 500 MG tablet Take 1,000 mg every 6 (six) hours as needed by mouth for moderate pain or headache.    Marland Kitchen aspirin EC 81 MG tablet Take 81 mg daily by mouth.   . bisoprolol-hydrochlorothiazide (ZIAC) 10-6.25 MG tablet TAKE 1 TABLET EVERY DAY   . CALCIUM-MAGNESIUM-VITAMIN D PO Take by mouth.   . cetirizine (KLS ALLER-TEC) 10 MG tablet  Take 10 mg by mouth daily.   . fluticasone (FLONASE) 50 MCG/ACT nasal spray USE 2 SPRAYS IN EACH NOSTRIL EVERY DAY   . metFORMIN (GLUCOPHAGE-XR) 500 MG 24 hr tablet Take 1 tablet (500 mg total) by mouth 3 (three) times daily.   . Multiple Vitamins-Minerals (CENTRUM SILVER PO) Take 1 tablet by mouth daily.    . simvastatin (ZOCOR) 20 MG tablet Take 1 tablet (20 mg total) by mouth at bedtime.   . triamcinolone cream (KENALOG) 0.1 % APPLY TO AFFECTED AREA TWICE A DAY   . TRUEplus Lancets 30G MISC TEST BLOOD SUGAR ONE TO TWO TIMES DAILY   . umeclidinium-vilanterol (ANORO ELLIPTA) 62.5-25 MCG/INH AEPB Inhale 1 puff into the lungs daily.   Marland Kitchen albuterol (PROVENTIL) (2.5 MG/3ML) 0.083% nebulizer solution USE 1 VIAL BY NEBULIZATION EVERY FOUR HOURS AS NEEDED FOR WHEEZING OR SHORTNESS OF BREATH. (Patient not taking: Reported on 05/12/2019)   . albuterol (VENTOLIN HFA) 108 (90 Base) MCG/ACT inhaler Inhale 2 puffs into the lungs every 6 (six) hours as needed for wheezing or shortness of breath. (Patient not taking: Reported on 05/06/2019) 05/06/2019: PRN  . doxycycline (VIBRA-TABS) 100 MG tablet Take 1 tablet (100 mg total) by mouth 2 (two) times daily. (Patient not taking: Reported on 05/06/2019)   . furosemide (LASIX) 20 MG tablet TAKE 1 TABLET (20 MG TOTAL) BY MOUTH DAILY AS NEEDED FOR FLUID OR EDEMA. (Patient not taking: Reported on 04/07/2019)    No facility-administered  encounter medications on file as of 05/12/2019.    Allergies  Allergen Reactions  . Contrast Media [Iodinated Diagnostic Agents] Anaphylaxis, Shortness Of Breath and Other (See Comments)    Could not breath  . Iohexol Anaphylaxis, Shortness Of Breath and Other (See Comments)    Immediately could not breathe  . Lisinopril Anaphylaxis, Shortness Of Breath and Rash  . Sulfa Antibiotics Anaphylaxis and Other (See Comments)    Historical from mother, pt states that mother says she almost died from this drug  . Latex Other (See Comments)     Unless against on skin for a long time, blisters. Short term is okay.  . Codeine Nausea Only, Anxiety and Other (See Comments)    insomnia  . Levofloxacin Other (See Comments)    insomnia  . Lipitor [Atorvastatin Calcium] Rash  . Vicodin [Hydrocodone-Acetaminophen] Itching   ROS:  Low grade fever per HPI.  No headache, dizziness, chest pain, cough, shortness of breath (breathing remains at baseline).  Loss of smell and taste, which affects appetite some.  No nausea, vomiting, diarrhea, rashes, or other concerns except as noted in HPI   Observations/Objective:  BP 112/63   Pulse 79   Temp 98.2 F (36.8 C) (Temporal)   Ht 5\' 4"  (1.626 m)   Wt 217 lb (98.4 kg)   LMP  (LMP Unknown)   BMI 37.25 kg/m   Pleasant, well-appearing female, in no distress She is alert, oriented, cranial nerves are grossly intact. She is speaking easily and comfortably, no coughing or shortness of breath noted. Exam is limited due to video/virtual nature of the visit.   Assessment and Plan:  COVID-19 virus infection - mild symptoms, doing well overall. Reviewed guidelines/recommendations re: isolation and info provided   Follow Up Instructions:   I discussed the assessment and treatment plan with the patient. The patient was provided an opportunity to ask questions and all were answered. The patient agreed with the plan and demonstrated an understanding of the instructions.   The patient was advised to call back or seek an in-person evaluation if the symptoms worsen or if the condition fails to improve as anticipated.  I provided 18 minutes of non-face-to-face time during this encounter.   Shannon Ports, MD

## 2019-05-13 ENCOUNTER — Telehealth: Payer: Self-pay | Admitting: Pulmonary Disease

## 2019-05-13 NOTE — Telephone Encounter (Signed)
Called and spoke to patient. Patient stated that she just wanted to let us know about testing positive. Patient reported her breathing is fine and she is not having any shortness of breath of issues currently.  Let patient know that if at any point she does need a video or telephone visit we will be able to set that up for her.  Patient verbalized understanding.

## 2019-05-22 ENCOUNTER — Telehealth: Payer: Self-pay | Admitting: Pulmonary Disease

## 2019-05-22 DIAGNOSIS — J9611 Chronic respiratory failure with hypoxia: Secondary | ICD-10-CM

## 2019-05-22 NOTE — Telephone Encounter (Signed)
I was contacted by Sleep physician, Dr. Halford Chessman regarding home sleep study results:  05/03/19 showed an AHI of 8.8 and SpO2 low of 76%. She spent 302.4 minutes of test time with SpO2 < 89%. Probably should have an in lab titration study since she has OSA and hypoventilation/hypoxia.   Assessment Shannon Schultz has mild OSA with hypoxemia that warrants further evaluation with in-lab sleep titration. However on review of notes, patient recently tested positive for COVID-19 and will need to wait until negative before being eligible for titration.  Plan Prescribe patient to wear 2L oxygen at night and when sats <88%. (Lauren, please arrange for face-to-face if needed) Will arrange for in-lab sleep titration when she is eligible to re-test for COVID-19 and negative. Follow-up in 3 months or sooner if needed to qualify for oxygen.  Rodman Pickle, M.D. [facility] Pulmonary/Critical Care Medicine 05/22/2019 11:59 AM

## 2019-05-30 NOTE — Telephone Encounter (Signed)
Spoke with patient She already had O2 Order placed for providing with enough for HS also.  If [employer] needs OV then we will call the patient back, otherwise 3 month recall placed.  Will route to [place] as FYI  Nothing further needed at this time.

## 2019-06-05 DIAGNOSIS — J449 Chronic obstructive pulmonary disease, unspecified: Secondary | ICD-10-CM | POA: Diagnosis not present

## 2019-06-05 DIAGNOSIS — J441 Chronic obstructive pulmonary disease with (acute) exacerbation: Secondary | ICD-10-CM | POA: Diagnosis not present

## 2019-06-17 ENCOUNTER — Ambulatory Visit (INDEPENDENT_AMBULATORY_CARE_PROVIDER_SITE_OTHER): Payer: Medicare HMO | Admitting: Cardiovascular Disease

## 2019-06-17 ENCOUNTER — Encounter: Payer: Self-pay | Admitting: Cardiovascular Disease

## 2019-06-17 ENCOUNTER — Other Ambulatory Visit: Payer: Self-pay

## 2019-06-17 VITALS — BP 148/77 | HR 97 | Temp 97.3°F | Ht 64.0 in | Wt 215.4 lb

## 2019-06-17 DIAGNOSIS — E1169 Type 2 diabetes mellitus with other specified complication: Secondary | ICD-10-CM | POA: Diagnosis not present

## 2019-06-17 DIAGNOSIS — E7800 Pure hypercholesterolemia, unspecified: Secondary | ICD-10-CM | POA: Diagnosis not present

## 2019-06-17 DIAGNOSIS — Z91041 Radiographic dye allergy status: Secondary | ICD-10-CM | POA: Diagnosis not present

## 2019-06-17 DIAGNOSIS — E66812 Obesity, class 2: Secondary | ICD-10-CM

## 2019-06-17 DIAGNOSIS — J449 Chronic obstructive pulmonary disease, unspecified: Secondary | ICD-10-CM

## 2019-06-17 DIAGNOSIS — Z6836 Body mass index (BMI) 36.0-36.9, adult: Secondary | ICD-10-CM

## 2019-06-17 DIAGNOSIS — E669 Obesity, unspecified: Secondary | ICD-10-CM | POA: Diagnosis not present

## 2019-06-17 DIAGNOSIS — I350 Nonrheumatic aortic (valve) stenosis: Secondary | ICD-10-CM | POA: Diagnosis not present

## 2019-06-17 DIAGNOSIS — I10 Essential (primary) hypertension: Secondary | ICD-10-CM

## 2019-06-17 DIAGNOSIS — E119 Type 2 diabetes mellitus without complications: Secondary | ICD-10-CM

## 2019-06-17 NOTE — Progress Notes (Signed)
Cardiology Office Note:    Date:  06/19/2019   ID:  Shannon Schultz, DOB 1946/02/25, MRN [id number]  PCP:  Shannon Ohara, MD  Cardiologist:   Shannon Klein, MD Electrophysiologist:  None  Referring MD: Shannon Ohara, MD   Chief Complaint  Patient presents with  . Cardiac Valve Problem    History of Present Illness:    Shannon Schultz is a 73 y.o. female with a hx of moderate aortic valve stenosis, hypertension, cough symptoms, obesity and COPD.  She has chronic exertional shortness of breath, NYHA functional class II.  This is generally well controlled with inhalers.  She has occasional obesity.  She denies orthopnea or PND or leg edema.  She does not have exertional chest discomfort or history of syncope.  Her most recent echocardiogram performed 06/09/2018 showed mild worsening of her aortic stenosis with peak gradient of 52 mmHg, mean gradient 30 mmHg and a calculated aortic valve area of 1.2 cm, dimensionless index however was in the severe range of 0.25.  She had a normal nuclear stress test in 2016.  She has a history of a severe contrast allergy following the CT scan of the abdomen.  Past Medical History:  Diagnosis Date  . Aortic stenosis    mild-mod by 04/08/16 echo; mod AS 04/2017  . Arthritis    "hands" (05/15/2017)  . Bladder cancer ([place]) 2018  . Breast cancer, right ([place]) 1992   DCIS,bladder ca (just dx)  . Colon polyp   . Complication of anesthesia 1992   "local anesthesia" used was hard to awaken from-no problems since (05/15/2017)  . COPD (chronic obstructive pulmonary disease) ([place])   . COVID-19 virus infection 05/05/2019  . Diverticulosis   . Dyspnea   . Elevated cholesterol   . Elevated liver enzymes    fatty liver per ultrasound per pt  . Environmental allergies    "I have allergies year round" (05/15/2017)  . Family history of malignant neoplasm of breast   . FHx: BRCA2 gene positive    sister with BRCA2 mutation (pt tested NEGATIVE)  . Genital HSV     gets on hip  . Heart murmur     echo 05/2011 mild aortic stenosis  . HSV (herpes simplex virus) infection    on hip--on daily suppression  . Hypertension   . Hypothyroidism    took med 7 yrs after birth of 1st child  . Impaired glucose tolerance   . Migraine   . On home oxygen therapy    "have it available but I'm not using it" (05/15/2017)  . Osteopenia   . Pneumonia ~ 1950; 08/06/2010  . Type 2 diabetes mellitus ([place])   . Ventral hernia   . Vitamin D deficiency disease     Past Surgical History:  Procedure Laterality Date  . ABDOMINAL HERNIA REPAIR  05/15/2017  . BREAST BIOPSY Right 1992  . BREAST BIOPSY Left 2018  . BREAST EXCISIONAL BIOPSY Left 2018   ATYPICAL DUCTAL HYPERPLASIA INVOLVING A COMPLEX  . BREAST LUMPECTOMY WITH RADIOACTIVE SEED LOCALIZATION Left 12/22/2016   Procedure: LEFT BREAST LUMPECTOMY WITH RADIOACTIVE SEED LOCALIZATION;  Surgeon: Jovita Kussmaul, MD;  Location: [facility];  Service: General;  Laterality: Left;  . CATARACT EXTRACTION, BILATERAL Bilateral R 03/16/2019 L 03/30/2019   Dr. Kathlen Mody  . COLONOSCOPY  2009, 08/2010, 12/2015   Dr. Collene Mares; "only 1 had any polyps" (05/15/2017)  . CYSTOSCOPY  04/23/2017  . CYSTOSCOPY W/ RETROGRADES Bilateral 01/12/2017   Procedure: CYSTOSCOPY WITH RETROGRADE  PYELOGRAM/ EXAM UNDER ANESTHESIA;  Surgeon: Raynelle Bring, MD;  Location: [facility];  Service: Urology;  Laterality: Bilateral;  . HERNIA REPAIR    . INSERTION OF MESH N/A 05/15/2017   Procedure: INSERTION OF MESH;  Surgeon: Jovita Kussmaul, MD;  Location: [facility];  Service: General;  Laterality: N/A;  . LAPAROSCOPIC CHOLECYSTECTOMY  2003  . MASTECTOMY Right 1992  . TRANSURETHRAL RESECTION OF BLADDER TUMOR WITH MITOMYCIN-C N/A 01/12/2017   Procedure: TRANSURETHRAL RESECTION OF BLADDER TUMOR WITH POSSIBLE POST OPERATIVE INSTILLATION OF MITOMYCIN-C;  Surgeon: Raynelle Bring, MD;  Location: [facility];  Service: Urology;  Laterality: N/A;  . TYMPANOSTOMY TUBE PLACEMENT Bilateral 1980s   . UMBILICAL HERNIA REPAIR  2003  . VENTRAL HERNIA REPAIR N/A 05/15/2017   Procedure: VENTRAL HERNIA REPAIR WITH MESH;  Surgeon: Jovita Kussmaul, MD;  Location: [facility];  Service: General;  Laterality: N/A;    Current Medications: Current Meds  Medication Sig  . ACCU-CHEK AVIVA PLUS test strip TEST BLOOD SUGAR ONE TO TWO TIMES DAILY  . acetaminophen (TYLENOL) 500 MG tablet Take 1,000 mg every 6 (six) hours as needed by mouth for moderate pain or headache.   . albuterol (PROVENTIL) (2.5 MG/3ML) 0.083% nebulizer solution USE 1 VIAL BY NEBULIZATION EVERY FOUR HOURS AS NEEDED FOR WHEEZING OR SHORTNESS OF BREATH.  Marland Kitchen albuterol (VENTOLIN HFA) 108 (90 Base) MCG/ACT inhaler Inhale 2 puffs into the lungs every 6 (six) hours as needed for wheezing or shortness of breath.  Marland Kitchen aspirin EC 81 MG tablet Take 81 mg daily by mouth.  . bisoprolol-hydrochlorothiazide (ZIAC) 10-6.25 MG tablet TAKE 1 TABLET EVERY DAY  . CALCIUM-MAGNESIUM-VITAMIN D PO Take by mouth.  . cetirizine (KLS ALLER-TEC) 10 MG tablet Take 10 mg by mouth daily.  . fluticasone (FLONASE) 50 MCG/ACT nasal spray USE 2 SPRAYS IN EACH NOSTRIL EVERY DAY  . furosemide (LASIX) 20 MG tablet TAKE 1 TABLET (20 MG TOTAL) BY MOUTH DAILY AS NEEDED FOR FLUID OR EDEMA.  . metFORMIN (GLUCOPHAGE-XR) 500 MG 24 hr tablet Take 1 tablet (500 mg total) by mouth 3 (three) times daily.  . Multiple Vitamins-Minerals (CENTRUM SILVER PO) Take 1 tablet by mouth daily.   Marland Kitchen neomycin-polymyxin b-dexamethasone (MAXITROL) 3.5-10000-0.1 OINT APPLY TO RIGHT EYE BID UTD  . simvastatin (ZOCOR) 20 MG tablet Take 1 tablet (20 mg total) by mouth at bedtime.  . triamcinolone cream (KENALOG) 0.1 % APPLY TO AFFECTED AREA TWICE A DAY  . TRUEplus Lancets 30G MISC TEST BLOOD SUGAR ONE TO TWO TIMES DAILY     Allergies:   Contrast media [iodinated diagnostic agents], Iohexol, Lisinopril, Sulfa antibiotics, Latex, Codeine, Levofloxacin, Lipitor [atorvastatin calcium], and Vicodin  [hydrocodone-acetaminophen]   Social History   Socioeconomic History  . Marital status: Married    Spouse name: Not on file  . Number of children: 3  . Years of education: Not on file  . Highest education level: Not on file  Occupational History  . Occupation: Retired  Tobacco Use  . Smoking status: Former Smoker    Packs/day: 1.00    Years: 46.00    Pack years: 46.00    Types: Cigarettes    Quit date: 05/31/2011    Years since quitting: 8.0  . Smokeless tobacco: Never Used  Substance and Sexual Activity  . Alcohol use: No  . Drug use: No  . Sexual activity: Yes    Partners: Male    Birth control/protection: Post-menopausal  Other Topics Concern  . Not on file  Social History  Narrative   Lives with her husband.  Children all live in [place] nearby. No pets. 6 grandchildren.   Social Determinants of Health   Financial Resource Strain:   . Difficulty of Paying Living Expenses: Not on file  Food Insecurity:   . Worried About Charity fundraiser in the Last Year: Not on file  . Ran Out of Food in the Last Year: Not on file  Transportation Needs:   . Lack of Transportation (Medical): Not on file  . Lack of Transportation (Non-Medical): Not on file  Physical Activity:   . Days of Exercise per Week: Not on file  . Minutes of Exercise per Session: Not on file  Stress:   . Feeling of Stress : Not on file  Social Connections:   . Frequency of Communication with Friends and Family: Not on file  . Frequency of Social Gatherings with Friends and Family: Not on file  . Attends Religious Services: Not on file  . Active Member of Clubs or Organizations: Not on file  . Attends Archivist Meetings: Not on file  . Marital Status: Not on file     Family History: The patient's family history includes Allergies in her sister and sister; Asthma in her sister and sister; Breast cancer (age of onset: 2) in her sister; Cancer in her maternal grandmother and another family member;  Diabetes in her father; Fibromyalgia in her sister and sister; Hashimoto's thyroiditis in her sister and sister; Heart disease in her father and mother; Hyperlipidemia in her sister and sister; Hypertension in her father; Stroke (age of onset: 14) in her paternal grandmother; Tuberculosis in her maternal grandfather.  ROS:   Please see the history of present illness.     All other systems reviewed and are negative.  EKGs/Labs/Other Studies Reviewed:    The following studies were reviewed today:  ECHO 06/09/2018 - Left ventricle: The cavity size was normal. Systolic function was   normal. The estimated ejection fraction was in the range of 60%   to 65%. Wall motion was normal; there were no regional wall   motion abnormalities. The tissue Doppler parameters were   abnormal. The study is indeterminte for the evaluation of LV   diastolic function. - Aortic valve: Trileaflet; severely thickened, severely calcified   leaflets. Valve mobility was moderately restricted. There was   moderate stenosis. Peak velocity (S): 360 cm/s. Mean gradient   (S): 30 mm Hg. Peak gradient (S): 52 mm Hg. Valve area (VTI):   1.23 cm^2. - Mitral valve: Calcified annulus. There was mild regurgitation. - Right ventricle: Systolic function was normal. - Atrial septum: No defect or patent foramen ovale was identified. - Tricuspid valve: There was trivial regurgitation.  Impressions:  - Normal LV F, abnormal tissue doppler but indeterminate for   grading diastolic dysfunction. Moderate aortic stenosis by   velocty, gradient, and valve area. Mild progression of AS   compared to echo 1 year ago.   EKG:  EKG is  ordered today.  The ekg ordered today demonstrates normal sinus rhythm, normal tracing, no evidence of LVH.  Recent Labs: 04/04/2019: ALT 19; BUN 20; Creatinine, Ser 0.73; Hemoglobin 14.0; Platelets 116; Potassium 4.1; Sodium 142; TSH 2.600  Recent Lipid Panel    Component Value Date/Time   CHOL  111 09/13/2018 0830   TRIG 99 09/13/2018 0830   HDL 46 09/13/2018 0830   CHOLHDL 2.4 09/13/2018 0830   CHOLHDL 2.2 08/18/2016 1346   VLDL 19 08/18/2016 1346  [place] 45 09/13/2018 0830    Physical Exam:    VS:  BP (!) 148/77   Pulse 97   Temp (!) 97.3 F (36.3 C)   Ht '5\' 4"'$  (1.626 m)   Wt 215 lb 6.4 oz (97.7 kg)   LMP  (LMP Unknown)   SpO2 95%   BMI 36.97 kg/m     Wt Readings from Last 3 Encounters:  06/17/19 215 lb 6.4 oz (97.7 kg)  05/12/19 217 lb (98.4 kg)  05/06/19 221 lb (100.2 kg)     GEN: Severely obese, well nourished, well developed in no acute distress HEENT: Normal NECK: No JVD; No carotid bruits.  Carotid pulses are full without delay. LYMPHATICS: No lymphadenopathy CARDIAC: RRR, S2 is distinct, 3/6 aortic ejection murmur is early to mid peaking, no diastolic murmurs, rubs, gallops RESPIRATORY:  Clear to auscultation without rales, wheezing or rhonchi  ABDOMEN: Soft, non-tender, non-distended MUSCULOSKELETAL:  No edema; No deformity  SKIN: Warm and dry NEUROLOGIC:  Alert and oriented x 3 PSYCHIATRIC:  Normal affect   ASSESSMENT:    1. Aortic valve stenosis, etiology of cardiac valve disease unspecified   2. Contrast media allergy   3. Class 2 severe obesity due to excess calories with serious comorbidity and body mass index (BMI) of 36.0 to 36.9 in adult ([place])   4. Diabetes mellitus type 2 in obese ([place])   5. Hypercholesterolemia   6. Essential hypertension, benign   7. COPD, group B, by GOLD 2017 classification ([place])    PLAN:    In order of problems listed above:  1. AS: She does not have symptomatic aortic stenosis.  Her physical exam is consistent with moderate aortic stenosis, but her echocardiogram showed a peak velocity that is approaching 4 m/s,  dimensionless obstructive index of 0.25.  Recheck echocardiogram.  We might anticipate the need for aortic valve replacement in the last couple of years.  Shortness of breath at rest is unlikely to be  secondary to the valvular problem and could be due to COPD.  However, if she notices any worsening of her exertional dyspnea or she develops exertional chest tightness or syncope she should report this promptly.  Discussed the options of surgical versus transarterial AVR.  She is clearly a better candidate for TAVR due to her underlying lung disease.  Reviewed the fact that she will require preoperative evaluation with coronary angiography and CT of the chest/abdomen and pelvis. 2. Contrast allergy: She will require premedication before coronary angiography or CT. 3. Obesity: She is done an excellent job with weight loss recently bring her BMI from 43 to 36. Successful additional weight loss between now and the TAVR procedure would allow for a lower risk of complications and quicker recovery. 4. DM: Excellent control on metformin monotherapy. 5. HLP: All lipid parameters at target on current statin dose. 6. HTN: Blood pressure is a little high today but she has not yet taken her medications.  She reports that typically her systolic blood pressures in the 120s to low 130s.  No changes made today. 7. COPD: Reported prior FEV1 of 52%.  Tolerated surgery for ventral hernia repair in 2018 without major difficulties.   Medication Adjustments/Labs and Tests Ordered: Current medicines are reviewed at length with the patient today.  Concerns regarding medicines are outlined above.  Orders Placed This Encounter  Procedures  . EKG 12-Lead  . ECHOCARDIOGRAM COMPLETE   No orders of the defined types were placed in this encounter.   Patient Instructions  Medication Instructions: No changes *If you need a refill on your cardiac medications before your next appointment, please call your pharmacy*  Lab Work: none at this time    Testing/Procedures:Your physician has requested that you have an echocardiogram. Echocardiography is a painless test that uses sound waves to create images of your heart. It  provides your doctor with information about the size and shape of your heart and how well your heart's chambers and valves are working. This procedure takes approximately one hour. There are no restrictions for this procedure.  Test to be performed at [address]   Follow up:  visit in 12 Months    The format for your next appointment:   Either In Person or Virtual  Provider:   Sanda Klein, MD  Other Instructions      Signed, Shannon Klein, MD  06/19/2019 11:46 AM    [place]

## 2019-06-17 NOTE — Patient Instructions (Signed)
Medication Instructions: No changes *If you need a refill on your cardiac medications before your next appointment, please call your pharmacy*  Lab Work: none at this time    Testing/Procedures:Your physician has requested that you have an echocardiogram. Echocardiography is a painless test that uses sound waves to create images of your heart. It provides your doctor with information about the size and shape of your heart and how well your heart's chambers and valves are working. This procedure takes approximately one hour. There are no restrictions for this procedure.  Test to be performed at [address]   Follow up:  visit in 12 Months    The format for your next appointment:   Either In Person or Virtual  Provider:   Sanda Klein, MD  Other Instructions

## 2019-06-23 ENCOUNTER — Other Ambulatory Visit: Payer: Self-pay | Admitting: Family Medicine

## 2019-06-29 ENCOUNTER — Other Ambulatory Visit: Payer: Self-pay

## 2019-06-29 ENCOUNTER — Ambulatory Visit (HOSPITAL_COMMUNITY): Payer: Medicare HMO | Attending: Cardiology

## 2019-06-29 DIAGNOSIS — I350 Nonrheumatic aortic (valve) stenosis: Secondary | ICD-10-CM

## 2019-06-30 ENCOUNTER — Other Ambulatory Visit: Payer: Self-pay | Admitting: *Deleted

## 2019-06-30 DIAGNOSIS — I350 Nonrheumatic aortic (valve) stenosis: Secondary | ICD-10-CM

## 2019-07-06 DIAGNOSIS — J441 Chronic obstructive pulmonary disease with (acute) exacerbation: Secondary | ICD-10-CM | POA: Diagnosis not present

## 2019-07-06 DIAGNOSIS — J449 Chronic obstructive pulmonary disease, unspecified: Secondary | ICD-10-CM | POA: Diagnosis not present

## 2019-07-21 ENCOUNTER — Telehealth: Payer: Self-pay | Admitting: Pulmonary Disease

## 2019-07-21 NOTE — Telephone Encounter (Signed)
Will call for PA on 1/22.

## 2019-07-22 MED ORDER — ANORO ELLIPTA 62.5-25 MCG/INH IN AEPB: 1.0000 | INHALATION_SPRAY | Freq: Every day | RESPIRATORY_TRACT | 3 refills | Status: DC

## 2019-07-22 NOTE — Telephone Encounter (Signed)
Initiated PA through [facility] and will await response for approval.    Evalyne Lacap (Key: [id number])  Your information has been sent to [employer].

## 2019-07-22 NOTE — Telephone Encounter (Signed)
Called [employer] patient's Anoro ellipta was approved and made pharmacy aware from 07/22/2019-06/29/2020.Pharmacy is requesting a new prescriptions and this has been sent. Nothing further needed will close encounter.

## 2019-08-06 DIAGNOSIS — J449 Chronic obstructive pulmonary disease, unspecified: Secondary | ICD-10-CM | POA: Diagnosis not present

## 2019-08-06 DIAGNOSIS — J441 Chronic obstructive pulmonary disease with (acute) exacerbation: Secondary | ICD-10-CM | POA: Diagnosis not present

## 2019-08-19 ENCOUNTER — Encounter: Payer: Self-pay | Admitting: Family Medicine

## 2019-08-20 ENCOUNTER — Ambulatory Visit: Payer: Medicare HMO | Attending: Internal Medicine

## 2019-08-20 DIAGNOSIS — Z23 Encounter for immunization: Secondary | ICD-10-CM | POA: Insufficient documentation

## 2019-08-20 NOTE — Progress Notes (Signed)
   Covid-19 Vaccination Clinic  Name:  LYNDSI ALLY    MRN: [id number] DOB: [birth year]  08/20/2019  Ms. Mi was observed post Covid-19 immunization for 30 minutes based on pre-vaccination screening without incidence. She was provided with Vaccine Information Sheet and instruction to access the V-Safe system.   Ms. Osness was instructed to call 911 with any severe reactions post vaccine: Marland Kitchen Difficulty breathing  . Swelling of your face and throat  . A fast heartbeat  . A bad rash all over your body  . Dizziness and weakness    Immunizations Administered    Name Date Dose VIS Date Route   Pfizer COVID-19 Vaccine 08/20/2019 10:13 AM 0.3 mL 06/10/2019 Intramuscular   Manufacturer: [place]   Lot: [id number]   [place]: [id number]

## 2019-08-23 ENCOUNTER — Emergency Department (HOSPITAL_COMMUNITY): Payer: Medicare HMO

## 2019-08-23 ENCOUNTER — Encounter (HOSPITAL_COMMUNITY): Payer: Self-pay

## 2019-08-23 ENCOUNTER — Inpatient Hospital Stay (HOSPITAL_COMMUNITY)
Admission: EM | Admit: 2019-08-23 | Discharge: 2019-08-31 | DRG: 481 | Disposition: A | Discharge status: Skilled Nursing Facility | Payer: Medicare HMO | Attending: Internal Medicine | Admitting: Internal Medicine

## 2019-08-23 ENCOUNTER — Other Ambulatory Visit: Payer: Self-pay

## 2019-08-23 DIAGNOSIS — Y92000 Kitchen of unspecified non-institutional (private) residence as  the place of occurrence of the external cause: Secondary | ICD-10-CM | POA: Diagnosis not present

## 2019-08-23 DIAGNOSIS — S42291A Other displaced fracture of upper end of right humerus, initial encounter for closed fracture: Secondary | ICD-10-CM

## 2019-08-23 DIAGNOSIS — Z79899 Other long term (current) drug therapy: Secondary | ICD-10-CM

## 2019-08-23 DIAGNOSIS — E785 Hyperlipidemia, unspecified: Secondary | ICD-10-CM | POA: Diagnosis present

## 2019-08-23 DIAGNOSIS — Z803 Family history of malignant neoplasm of breast: Secondary | ICD-10-CM | POA: Diagnosis not present

## 2019-08-23 DIAGNOSIS — I10 Essential (primary) hypertension: Secondary | ICD-10-CM | POA: Diagnosis not present

## 2019-08-23 DIAGNOSIS — Z8349 Family history of other endocrine, nutritional and metabolic diseases: Secondary | ICD-10-CM

## 2019-08-23 DIAGNOSIS — Z7984 Long term (current) use of oral hypoglycemic drugs: Secondary | ICD-10-CM

## 2019-08-23 DIAGNOSIS — Z8551 Personal history of malignant neoplasm of bladder: Secondary | ICD-10-CM | POA: Diagnosis not present

## 2019-08-23 DIAGNOSIS — J449 Chronic obstructive pulmonary disease, unspecified: Secondary | ICD-10-CM | POA: Diagnosis not present

## 2019-08-23 DIAGNOSIS — Z87891 Personal history of nicotine dependence: Secondary | ICD-10-CM

## 2019-08-23 DIAGNOSIS — W19XXXA Unspecified fall, initial encounter: Secondary | ICD-10-CM | POA: Diagnosis not present

## 2019-08-23 DIAGNOSIS — E119 Type 2 diabetes mellitus without complications: Secondary | ICD-10-CM | POA: Diagnosis not present

## 2019-08-23 DIAGNOSIS — Z885 Allergy status to narcotic agent status: Secondary | ICD-10-CM

## 2019-08-23 DIAGNOSIS — Z888 Allergy status to other drugs, medicaments and biological substances status: Secondary | ICD-10-CM

## 2019-08-23 DIAGNOSIS — Z882 Allergy status to sulfonamides status: Secondary | ICD-10-CM | POA: Diagnosis not present

## 2019-08-23 DIAGNOSIS — M25519 Pain in unspecified shoulder: Secondary | ICD-10-CM | POA: Diagnosis not present

## 2019-08-23 DIAGNOSIS — S72009A Fracture of unspecified part of neck of unspecified femur, initial encounter for closed fracture: Secondary | ICD-10-CM | POA: Diagnosis present

## 2019-08-23 DIAGNOSIS — Z91041 Radiographic dye allergy status: Secondary | ICD-10-CM | POA: Diagnosis not present

## 2019-08-23 DIAGNOSIS — Z853 Personal history of malignant neoplasm of breast: Secondary | ICD-10-CM

## 2019-08-23 DIAGNOSIS — Z8616 Personal history of COVID-19: Secondary | ICD-10-CM

## 2019-08-23 DIAGNOSIS — Z825 Family history of asthma and other chronic lower respiratory diseases: Secondary | ICD-10-CM

## 2019-08-23 DIAGNOSIS — J9611 Chronic respiratory failure with hypoxia: Secondary | ICD-10-CM | POA: Diagnosis not present

## 2019-08-23 DIAGNOSIS — R2681 Unsteadiness on feet: Secondary | ICD-10-CM | POA: Diagnosis not present

## 2019-08-23 DIAGNOSIS — Z6836 Body mass index (BMI) 36.0-36.9, adult: Secondary | ICD-10-CM | POA: Diagnosis not present

## 2019-08-23 DIAGNOSIS — M19041 Primary osteoarthritis, right hand: Secondary | ICD-10-CM | POA: Diagnosis present

## 2019-08-23 DIAGNOSIS — M6281 Muscle weakness (generalized): Secondary | ICD-10-CM | POA: Diagnosis not present

## 2019-08-23 DIAGNOSIS — S72001A Fracture of unspecified part of neck of right femur, initial encounter for closed fracture: Secondary | ICD-10-CM | POA: Diagnosis not present

## 2019-08-23 DIAGNOSIS — S42254A Nondisplaced fracture of greater tuberosity of right humerus, initial encounter for closed fracture: Secondary | ICD-10-CM | POA: Diagnosis not present

## 2019-08-23 DIAGNOSIS — S43014A Anterior dislocation of right humerus, initial encounter: Secondary | ICD-10-CM | POA: Diagnosis not present

## 2019-08-23 DIAGNOSIS — S299XXA Unspecified injury of thorax, initial encounter: Secondary | ICD-10-CM | POA: Diagnosis not present

## 2019-08-23 DIAGNOSIS — E039 Hypothyroidism, unspecified: Secondary | ICD-10-CM | POA: Diagnosis present

## 2019-08-23 DIAGNOSIS — S43004S Unspecified dislocation of right shoulder joint, sequela: Secondary | ICD-10-CM | POA: Diagnosis not present

## 2019-08-23 DIAGNOSIS — Z01818 Encounter for other preprocedural examination: Secondary | ICD-10-CM

## 2019-08-23 DIAGNOSIS — Z9841 Cataract extraction status, right eye: Secondary | ICD-10-CM

## 2019-08-23 DIAGNOSIS — M19042 Primary osteoarthritis, left hand: Secondary | ICD-10-CM | POA: Diagnosis present

## 2019-08-23 DIAGNOSIS — S72141A Displaced intertrochanteric fracture of right femur, initial encounter for closed fracture: Secondary | ICD-10-CM

## 2019-08-23 DIAGNOSIS — D62 Acute posthemorrhagic anemia: Secondary | ICD-10-CM | POA: Diagnosis not present

## 2019-08-23 DIAGNOSIS — E7800 Pure hypercholesterolemia, unspecified: Secondary | ICD-10-CM | POA: Diagnosis present

## 2019-08-23 DIAGNOSIS — M8580 Other specified disorders of bone density and structure, unspecified site: Secondary | ICD-10-CM | POA: Diagnosis present

## 2019-08-23 DIAGNOSIS — S7291XA Unspecified fracture of right femur, initial encounter for closed fracture: Secondary | ICD-10-CM | POA: Diagnosis not present

## 2019-08-23 DIAGNOSIS — R001 Bradycardia, unspecified: Secondary | ICD-10-CM | POA: Diagnosis not present

## 2019-08-23 DIAGNOSIS — G4733 Obstructive sleep apnea (adult) (pediatric): Secondary | ICD-10-CM | POA: Diagnosis present

## 2019-08-23 DIAGNOSIS — M2550 Pain in unspecified joint: Secondary | ICD-10-CM | POA: Diagnosis not present

## 2019-08-23 DIAGNOSIS — K7460 Unspecified cirrhosis of liver: Secondary | ICD-10-CM | POA: Diagnosis present

## 2019-08-23 DIAGNOSIS — Z9049 Acquired absence of other specified parts of digestive tract: Secondary | ICD-10-CM

## 2019-08-23 DIAGNOSIS — Z91040 Latex allergy status: Secondary | ICD-10-CM

## 2019-08-23 DIAGNOSIS — K760 Fatty (change of) liver, not elsewhere classified: Secondary | ICD-10-CM | POA: Diagnosis present

## 2019-08-23 DIAGNOSIS — Z9981 Dependence on supplemental oxygen: Secondary | ICD-10-CM

## 2019-08-23 DIAGNOSIS — S43004A Unspecified dislocation of right shoulder joint, initial encounter: Secondary | ICD-10-CM | POA: Diagnosis not present

## 2019-08-23 DIAGNOSIS — Z419 Encounter for procedure for purposes other than remedying health state, unspecified: Secondary | ICD-10-CM

## 2019-08-23 DIAGNOSIS — Z6839 Body mass index (BMI) 39.0-39.9, adult: Secondary | ICD-10-CM | POA: Diagnosis present

## 2019-08-23 DIAGNOSIS — Z823 Family history of stroke: Secondary | ICD-10-CM

## 2019-08-23 DIAGNOSIS — Z86010 Personal history of colonic polyps: Secondary | ICD-10-CM | POA: Diagnosis not present

## 2019-08-23 DIAGNOSIS — Z7982 Long term (current) use of aspirin: Secondary | ICD-10-CM

## 2019-08-23 DIAGNOSIS — S72141S Displaced intertrochanteric fracture of right femur, sequela: Secondary | ICD-10-CM | POA: Diagnosis not present

## 2019-08-23 DIAGNOSIS — I959 Hypotension, unspecified: Secondary | ICD-10-CM | POA: Diagnosis not present

## 2019-08-23 DIAGNOSIS — S42294A Other nondisplaced fracture of upper end of right humerus, initial encounter for closed fracture: Secondary | ICD-10-CM | POA: Diagnosis not present

## 2019-08-23 DIAGNOSIS — U071 COVID-19: Secondary | ICD-10-CM | POA: Diagnosis not present

## 2019-08-23 DIAGNOSIS — K5900 Constipation, unspecified: Secondary | ICD-10-CM | POA: Diagnosis not present

## 2019-08-23 DIAGNOSIS — I350 Nonrheumatic aortic (valve) stenosis: Secondary | ICD-10-CM | POA: Diagnosis not present

## 2019-08-23 DIAGNOSIS — Z9011 Acquired absence of right breast and nipple: Secondary | ICD-10-CM

## 2019-08-23 DIAGNOSIS — Z833 Family history of diabetes mellitus: Secondary | ICD-10-CM

## 2019-08-23 DIAGNOSIS — Z7401 Bed confinement status: Secondary | ICD-10-CM | POA: Diagnosis not present

## 2019-08-23 DIAGNOSIS — E1169 Type 2 diabetes mellitus with other specified complication: Secondary | ICD-10-CM

## 2019-08-23 DIAGNOSIS — W010XXA Fall on same level from slipping, tripping and stumbling without subsequent striking against object, initial encounter: Secondary | ICD-10-CM | POA: Diagnosis present

## 2019-08-23 DIAGNOSIS — R52 Pain, unspecified: Secondary | ICD-10-CM | POA: Diagnosis not present

## 2019-08-23 DIAGNOSIS — E559 Vitamin D deficiency, unspecified: Secondary | ICD-10-CM | POA: Diagnosis present

## 2019-08-23 DIAGNOSIS — Z9181 History of falling: Secondary | ICD-10-CM | POA: Diagnosis not present

## 2019-08-23 DIAGNOSIS — R262 Difficulty in walking, not elsewhere classified: Secondary | ICD-10-CM | POA: Diagnosis not present

## 2019-08-23 DIAGNOSIS — Z9842 Cataract extraction status, left eye: Secondary | ICD-10-CM

## 2019-08-23 DIAGNOSIS — Z8249 Family history of ischemic heart disease and other diseases of the circulatory system: Secondary | ICD-10-CM

## 2019-08-23 DIAGNOSIS — J9610 Chronic respiratory failure, unspecified whether with hypoxia or hypercapnia: Secondary | ICD-10-CM | POA: Diagnosis not present

## 2019-08-23 DIAGNOSIS — G8929 Other chronic pain: Secondary | ICD-10-CM | POA: Diagnosis present

## 2019-08-23 LAB — CBC WITH DIFFERENTIAL/PLATELET
Abs Immature Granulocytes: 0.05 10*3/uL (ref 0.00–0.07)
Basophils Absolute: 0 10*3/uL (ref 0.0–0.1)
Basophils Relative: 1 %
Eosinophils Absolute: 0 10*3/uL (ref 0.0–0.5)
Eosinophils Relative: 1 %
HCT: 40 % (ref 36.0–46.0)
Hemoglobin: 13 g/dL (ref 12.0–15.0)
Immature Granulocytes: 1 %
Lymphocytes Relative: 8 %
Lymphs Abs: 0.6 10*3/uL — ABNORMAL LOW (ref 0.7–4.0)
MCH: 29.5 pg (ref 26.0–34.0)
MCHC: 32.5 g/dL (ref 30.0–36.0)
MCV: 90.7 fL (ref 80.0–100.0)
Monocytes Absolute: 0.3 10*3/uL (ref 0.1–1.0)
Monocytes Relative: 4 %
Neutro Abs: 6.1 10*3/uL (ref 1.7–7.7)
Neutrophils Relative %: 85 %
Platelets: 116 10*3/uL — ABNORMAL LOW (ref 150–400)
RBC: 4.41 MIL/uL (ref 3.87–5.11)
RDW: 14.5 % (ref 11.5–15.5)
WBC: 7.1 10*3/uL (ref 4.0–10.5)
nRBC: 0 % (ref 0.0–0.2)

## 2019-08-23 LAB — BASIC METABOLIC PANEL
Anion gap: 11 (ref 5–15)
BUN: 23 mg/dL (ref 8–23)
CO2: 24 mmol/L (ref 22–32)
Calcium: 9.1 mg/dL (ref 8.9–10.3)
Chloride: 103 mmol/L (ref 98–111)
Creatinine, Ser: 0.64 mg/dL (ref 0.44–1.00)
GFR calc Af Amer: >60 mL/min (ref >60)
GFR calc non Af Amer: >60 mL/min (ref >60)
Glucose, Bld: 166 mg/dL — ABNORMAL HIGH (ref 70–99)
Potassium: 3.9 mmol/L (ref 3.5–5.1)
Sodium: 138 mmol/L (ref 135–145)

## 2019-08-23 MED ORDER — PROPOFOL 10 MG/ML IV BOLUS
INTRAVENOUS | Status: AC | PRN
  Administered 2019-08-23: 50 ug via INTRAVENOUS

## 2019-08-23 MED ORDER — PROPOFOL 10 MG/ML IV BOLUS
0.5000 mg/kg | INTRAVENOUS | Status: DC | PRN
  Filled 2019-08-23: qty 20

## 2019-08-23 MED ORDER — FENTANYL CITRATE (PF) 100 MCG/2ML IJ SOLN
50.0000 ug | Freq: Once | INTRAMUSCULAR | Status: AC
  Administered 2019-08-23: 19:00:00 50 ug via INTRAVENOUS
  Filled 2019-08-23: qty 2

## 2019-08-23 MED ORDER — PROPOFOL 10 MG/ML IV BOLUS
INTRAVENOUS | Status: AC
  Administered 2019-08-23: 21:00:00 200 mg
  Filled 2019-08-23: qty 40

## 2019-08-23 MED ORDER — MORPHINE SULFATE (PF) 4 MG/ML IV SOLN
4.0000 mg | Freq: Once | INTRAVENOUS | Status: AC
  Administered 2019-08-23: 23:00:00 4 mg via INTRAVENOUS
  Filled 2019-08-23: qty 1

## 2019-08-23 NOTE — ED Provider Notes (Addendum)
Patient presents to the ED for evaluation after a fall.  ED work-up is notable for a dislocation of her right shoulder as well as a fracture of the right hip.  Patient states last time she had something to eat was around noon.  She denies any significant complications to anesthesia before. Physical Exam  BP (!) 142/73   Pulse 94   Temp 98.1 F (36.7 C) (Oral)   Resp 18   Ht 1.626 m (5\' 4" )   Wt 95.7 kg   LMP  (LMP Unknown)   SpO2 96%   BMI 36.22 kg/m   Physical Exam HENT:     Head: Normocephalic.     Mouth/Throat:     Mouth: Mucous membranes are moist.     Pharynx: No oropharyngeal exudate.  Cardiovascular:     Rate and Rhythm: Normal rate and regular rhythm.     Pulses: Normal pulses.     Heart sounds: Normal heart sounds.  Pulmonary:     Effort: Pulmonary effort is normal.     Breath sounds: No wheezing or rhonchi.  Musculoskeletal:     Comments: Deformity and tenderness palpation right shoulder, deformity and tenderness to palpation right hip  Neurological:     General: No focal deficit present.     Mental Status: She is alert. Mental status is at baseline.     ED Course/Procedures     .Sedation  Date/Time: 08/23/2019 8:32 PM Performed by: Dorie Rank, MD Authorized by: Dorie Rank, MD   Consent:    Consent obtained:  Verbal   Consent given by:  Patient   Risks discussed:  Allergic reaction, dysrhythmia, inadequate sedation, nausea, prolonged hypoxia resulting in organ damage, prolonged sedation necessitating reversal, respiratory compromise necessitating ventilatory assistance and intubation and vomiting   Alternatives discussed:  Regional anesthesia Universal protocol:    Procedure explained and questions answered to patient or proxy's satisfaction: yes     Relevant documents present and verified: yes     Imaging studies available: yes     Site/side marked: yes     Immediately prior to procedure a time out was called: yes     Patient identity confirmation  method:  Verbally with patient Indications:    Procedure performed:  Dislocation reduction   Procedure necessitating sedation performed by:  Physician performing sedation Pre-sedation assessment:    Time since last food or drink:  8   NPO status caution: unable to specify NPO status     ASA classification: class 2 - patient with mild systemic disease     Neck mobility: normal     Mouth opening:  3 or more finger widths   Thyromental distance:  3 finger widths   Mallampati score:  II - soft palate, uvula, fauces visible   Pre-sedation assessments completed and reviewed: airway patency, cardiovascular function, hydration status, mental status, nausea/vomiting, pain level, respiratory function and temperature     Pre-sedation assessment completed:  08/23/2019 8:34 PM Immediate pre-procedure details:    Reassessment: Patient reassessed immediately prior to procedure     Reviewed: vital signs     Verified: bag valve mask available, emergency equipment available, intubation equipment available, IV patency confirmed, oxygen available and suction available   Procedure details (see MAR for exact dosages):    Preoxygenation:  Nasal cannula   Intended level of sedation: deep   Analgesia:  None   Intra-procedure monitoring:  Blood pressure monitoring, cardiac monitor, continuous pulse oximetry, continuous capnometry, frequent LOC assessments and frequent vital  sign checks   Intra-procedure events: none     Total Provider sedation time (minutes):  25 Post-procedure details:    Post-sedation assessment completed:  08/23/2019 9:31 PM   Attendance: Constant attendance by certified staff until patient recovered     Recovery: Patient returned to pre-procedure baseline     Post-sedation assessments completed and reviewed: airway patency, cardiovascular function, hydration status, mental status, nausea/vomiting, pain level and respiratory function     Patient is stable for discharge or admission: yes      Patient tolerance:  Tolerated well, no immediate complications    MDM  [place]oulder dislocation Reduced in the ED.  It was at the bedside and directly supervised the reduction. Plain film xrays show questionable reduction.  Clinically pt is feeling much better.  Has returned range of motion.  Will CT shoulder.  Dr Doran [facility] in the ED evaluating the patient.      Dorie Rank, MD 08/23/19 [phone]

## 2019-08-23 NOTE — ED Triage Notes (Signed)
EMS reports from home, slipped in kitchen fall from standing backwards landed on right hip and right shoulder on wood floor, obvious deformity to both, outward rotation of right leg. Prior injury to right rotator cuff.  BP 144/69 HR 86 RR 16 Sp02 94 RA CBG 129 Temp 98.1  18 LAC 100 Fentanyl  4mg  Zofran enroute

## 2019-08-23 NOTE — H&P (View-Only) (Signed)
Reason for Consult:  Right hip and shoulder pain Referring Physician:  Dr. [name] is an 74 y.o. female.  HPI: The patient is a 74 year old female with a past medical history significant for diabetes.  She fell about 330 this afternoon on her right side.  She has a complaint of right shoulder pain as well as right hip pain.  She was unable to bear weight after falling.  Radiographs reveal a dislocated right shoulder and a right hip intertrochanteric fracture.  Her shoulder has been reduced by Dr. Tomi Bamberger.  She reports that it feels much better now.  She denies numbness or tingling in the right upper extremity.  She is not a smoker.  She complains of aching pain in the hip that is worse with any attempted motion.  The hip feels better when lying still.  She denies any history of injury or surgery to that hip in the past.  She takes an aspirin but no other blood thinners.  Past Medical History:  Diagnosis Date  . Aortic stenosis    mild-mod by 04/08/16 echo; mod AS 04/2017  . Arthritis    "hands" (05/15/2017)  . Bladder cancer ([place]) 2018  . Breast cancer, right ([place]) 1992   DCIS,bladder ca (just dx)  . Colon polyp   . Complication of anesthesia 1992   "local anesthesia" used was hard to awaken from-no problems since (05/15/2017)  . COPD (chronic obstructive pulmonary disease) ([place])   . COVID-19 virus infection 05/05/2019  . Diverticulosis   . Dyspnea   . Elevated cholesterol   . Elevated liver enzymes    fatty liver per ultrasound per pt  . Environmental allergies    "I have allergies year round" (05/15/2017)  . Family history of malignant neoplasm of breast   . FHx: BRCA2 gene positive    sister with BRCA2 mutation (pt tested NEGATIVE)  . Genital HSV    gets on hip  . Heart murmur     echo 05/2011 mild aortic stenosis  . HSV (herpes simplex virus) infection    on hip--on daily suppression  . Hypertension   . Hypothyroidism    took med 7 yrs after birth of 1st child   . Impaired glucose tolerance   . Migraine   . On home oxygen therapy    "have it available but I'm not using it" (05/15/2017)  . Osteopenia   . Pneumonia ~ 1950; 08/06/2010  . Type 2 diabetes mellitus ([place])   . Ventral hernia   . Vitamin D deficiency disease     Past Surgical History:  Procedure Laterality Date  . ABDOMINAL HERNIA REPAIR  05/15/2017  . BREAST BIOPSY Right 1992  . BREAST BIOPSY Left 2018  . BREAST EXCISIONAL BIOPSY Left 2018   ATYPICAL DUCTAL HYPERPLASIA INVOLVING A COMPLEX  . BREAST LUMPECTOMY WITH RADIOACTIVE SEED LOCALIZATION Left 12/22/2016   Procedure: LEFT BREAST LUMPECTOMY WITH RADIOACTIVE SEED LOCALIZATION;  Surgeon: Jovita Kussmaul, MD;  Location: [facility];  Service: General;  Laterality: Left;  . CATARACT EXTRACTION, BILATERAL Bilateral R 03/16/2019 L 03/30/2019   Dr. Kathlen Mody  . COLONOSCOPY  2009, 08/2010, 12/2015   Dr. Collene Mares; "only 1 had any polyps" (05/15/2017)  . CYSTOSCOPY  04/23/2017  . CYSTOSCOPY W/ RETROGRADES Bilateral 01/12/2017   Procedure: CYSTOSCOPY WITH RETROGRADE PYELOGRAM/ EXAM UNDER ANESTHESIA;  Surgeon: Raynelle Bring, MD;  Location: [facility];  Service: Urology;  Laterality: Bilateral;  . HERNIA REPAIR    . INSERTION OF MESH N/A 05/15/2017  Procedure: INSERTION OF MESH;  Surgeon: Jovita Kussmaul, MD;  Location: [facility];  Service: General;  Laterality: N/A;  . LAPAROSCOPIC CHOLECYSTECTOMY  2003  . MASTECTOMY Right 1992  . TRANSURETHRAL RESECTION OF BLADDER TUMOR WITH MITOMYCIN-C N/A 01/12/2017   Procedure: TRANSURETHRAL RESECTION OF BLADDER TUMOR WITH POSSIBLE POST OPERATIVE INSTILLATION OF MITOMYCIN-C;  Surgeon: Raynelle Bring, MD;  Location[facility];  Service: Urology;  Laterality: N/A;  . TYMPANOSTOMY TUBE PLACEMENT Bilateral 1980s  . UMBILICAL HERNIA REPAIR  2003  . VENTRAL HERNIA REPAIR N/A 05/15/2017   Procedure: VENTRAL HERNIA REPAIR WITH MESH;  Surgeon: Jovita Kussmaul, MD;  Location[facility];  Service: General;  Laterality: N/A;    Family History   Problem Relation Age of Onset  . Heart disease Father   . Diabetes Father   . Hypertension Father   . Asthma Sister   . Allergies Sister   . Hyperlipidemia Sister   . Hashimoto's thyroiditis Sister   . Fibromyalgia Sister   . Heart disease Mother        tachycardia  . Asthma Sister   . Allergies Sister   . Hyperlipidemia Sister   . Hashimoto's thyroiditis Sister   . Fibromyalgia Sister   . Breast cancer Sister 22       BRCA2 positive; metastatic to bones at age 76, then spread to liver  . Cancer Other        female cancers, bone cancer  . Cancer Maternal Grandmother        deceased 43; unk. primary; possibly stomach  . Tuberculosis Maternal Grandfather   . Stroke Paternal Grandmother 2       died of cerebral hemorrhage    Social History:  reports that she quit smoking about 8 years ago. Her smoking use included cigarettes. She has a 46.00 pack-year smoking history. She has never used smokeless tobacco. She reports that she does not drink alcohol or use drugs.  Allergies:  Allergies  Allergen Reactions  . Contrast Media [Iodinated Diagnostic Agents] Anaphylaxis, Shortness Of Breath and Other (See Comments)    Could not breath  . Iohexol Anaphylaxis, Shortness Of Breath and Other (See Comments)    Immediately could not breathe  . Lisinopril Anaphylaxis, Shortness Of Breath and Rash  . Sulfa Antibiotics Anaphylaxis and Other (See Comments)    Historical from mother, pt states that mother says she almost died from this drug  . Latex Other (See Comments)    Unless against on skin for a long time, blisters. Short term is okay.  . Codeine Nausea Only, Anxiety and Other (See Comments)    insomnia  . Levofloxacin Other (See Comments)    insomnia  . Lipitor [Atorvastatin Calcium] Rash  . Vicodin [Hydrocodone-Acetaminophen] Itching    Medications: I have reviewed the patient's current medications.  Results for orders placed or performed during the hospital encounter of  08/23/19 (from the past 48 hour(s))  Basic metabolic panel     Status: Abnormal   Collection Time: 08/23/19  7:58 PM  Result Value Ref Range   Sodium 138 135 - 145 mmol/L   Potassium 3.9 3.5 - 5.1 mmol/L   Chloride 103 98 - 111 mmol/L   CO2 24 22 - 32 mmol/L   Glucose, Bld 166 (H) 70 - 99 mg/dL    Comment: Glucose reference range applies only to samples taken after fasting for at least 8 hours.   BUN 23 8 - 23 mg/dL   Creatinine, Ser 0.64 0.44 -  1.00 mg/dL   Calcium 9.1 8.9 - 10.3 mg/dL   GFR calc non Af Amer >60 >60 mL/min   GFR calc Af Amer >60 >60 mL/min   Anion gap 11 5 - 15    Comment: Performed at [facility], [place] [address], [place], [place] [address]  CBC with Differential     Status: Abnormal   Collection Time: 08/23/19  7:58 PM  Result Value Ref Range   WBC 7.1 4.0 - 10.5 K/uL   RBC 4.41 3.87 - 5.11 MIL/uL   Hemoglobin 13.0 12.0 - 15.0 g/dL   HCT 40.0 36.0 - 46.0 %   MCV 90.7 80.0 - 100.0 fL   MCH 29.5 26.0 - 34.0 pg   MCHC 32.5 30.0 - 36.0 g/dL   RDW 14.5 11.5 - 15.5 %   Platelets 116 (L) 150 - 400 K/uL    Comment: REPEATED TO VERIFY PLATELET COUNT CONFIRMED BY SMEAR SPECIMEN CHECKED FOR CLOTS Immature Platelet Fraction may be clinically indicated, consider ordering this additional test [id number]    nRBC 0.0 0.0 - 0.2 %   Neutrophils Relative % 85 %   Neutro Abs 6.1 1.7 - 7.7 K/uL   Lymphocytes Relative 8 %   Lymphs Abs 0.6 (L) 0.7 - 4.0 K/uL   Monocytes Relative 4 %   Monocytes Absolute 0.3 0.1 - 1.0 K/uL   Eosinophils Relative 1 %   Eosinophils Absolute 0.0 0.0 - 0.5 K/uL   Basophils Relative 1 %   Basophils Absolute 0.0 0.0 - 0.1 K/uL   Immature Granulocytes 1 %   Abs Immature Granulocytes 0.05 0.00 - 0.07 K/uL    Comment: Performed at [facility], [place] [address] [place], [place] [address]    DG Shoulder Right  Result Date: 08/23/2019 CLINICAL DATA:  Fall EXAM: RIGHT SHOULDER - 2+ VIEW COMPARISON:  None.  FINDINGS: Anterior shoulder dislocation. Probable fracture of the greater tuberosity. AC joint normal alignment IMPRESSION: Anterior shoulder dislocation. Anterior dislocation of shoulder. Probable fracture of the greater tuberosity. Electronically Signed   By: Franchot Gallo M.D.   On: 08/23/2019 19:37   DG Hip Unilat With Pelvis 2-3 Views Right  Result Date: 08/23/2019 CLINICAL DATA:  Fall EXAM: DG HIP (WITH OR WITHOUT PELVIS) 2-3V RIGHT COMPARISON:  None. FINDINGS: Intertrochanteric fracture right femur. Avulsion lesser trochanter. Mild joint space narrowing of both hips. No other pelvic fracture identified. IMPRESSION: Right intertrochanteric fracture. Electronically Signed   By: Franchot Gallo M.D.   On: 08/23/2019 19:38    ROS: No recent fever, chills, nausea, vomiting or changes in her appetite PE:  Blood pressure (!) 142/73, pulse 94, temperature 98.1 F (36.7 C), temperature source Oral, resp. rate 18, height 5' 4" (1.626 m), weight 95.7 kg, SpO2 96 %. Well-nourished well-developed woman in no apparent distress.  Alert and oriented x4.  Mood and affect are normal.  Extraocular motions are intact.  Respirations are unlabored.  She is resting comfortably on a hospital gurney.  The right shoulder is tender to palpation but grossly symmetric to the contralateral side.  She has forward flexion of 105 degrees.  With abduction of 90 degrees she can externally rotate about 45 degrees.  She complains of some soreness but no significant pain.  No apprehension with external rotation and abduction.  Intact sensibility to light touch in the axillary nerve distribution.  No lymphadenopathy at the right axilla.  5 out of 5 strength in the radial, median and ulnar nerve distributions.  2+ radial pulse on the right.  The right lower extremity is shortened and externally rotated.  Skin is healthy and intact over the right lower extremity.  1+ dorsalis pedis pulse.  Normal sensibility to light touch in the right  foot.  5 out of 5 strength in plantarflexion and dorsiflexion of the ankle and toes.  Assessment/Plan: Right shoulder dislocation status post closed reduction with likely greater tuberosity fracture -we will get a CT scan of her right shoulder to evaluate the greater tuberosity fracture and confirm reduction.  She will be in a shoulder immobilizer for comfort.  Right hip intertrochanteric fracture -to the operating room tomorrow for intramedullary nailing.  Covid test has been sent from the emergency room.  Patient will be admitted to the hospitalist service for management of her medical comorbidities.  N.p.o. after midnight.  Hold blood thinners.  I explained the plan to the patient in detail.  She understands and agrees.  Tajh Livsey 08/23/2019, 9:34 PM     

## 2019-08-23 NOTE — ED Notes (Signed)
Pt in x-ray at this time

## 2019-08-23 NOTE — Consult Note (Addendum)
Reason for Consult:  Right hip and shoulder pain Referring Physician:  Dr. [name] is an 74 y.o. female.  HPI: The patient is a 74 year old female with a past medical history significant for diabetes.  She fell about 330 this afternoon on her right side.  She has a complaint of right shoulder pain as well as right hip pain.  She was unable to bear weight after falling.  Radiographs reveal a dislocated right shoulder and a right hip intertrochanteric fracture.  Her shoulder has been reduced by Dr. Tomi Bamberger.  She reports that it feels much better now.  She denies numbness or tingling in the right upper extremity.  She is not a smoker.  She complains of aching pain in the hip that is worse with any attempted motion.  The hip feels better when lying still.  She denies any history of injury or surgery to that hip in the past.  She takes an aspirin but no other blood thinners.  Past Medical History:  Diagnosis Date  . Aortic stenosis    mild-mod by 04/08/16 echo; mod AS 04/2017  . Arthritis    "hands" (05/15/2017)  . Bladder cancer ([place]) 2018  . Breast cancer, right ([place]) 1992   DCIS,bladder ca (just dx)  . Colon polyp   . Complication of anesthesia 1992   "local anesthesia" used was hard to awaken from-no problems since (05/15/2017)  . COPD (chronic obstructive pulmonary disease) ([place])   . COVID-19 virus infection 05/05/2019  . Diverticulosis   . Dyspnea   . Elevated cholesterol   . Elevated liver enzymes    fatty liver per ultrasound per pt  . Environmental allergies    "I have allergies year round" (05/15/2017)  . Family history of malignant neoplasm of breast   . FHx: BRCA2 gene positive    sister with BRCA2 mutation (pt tested NEGATIVE)  . Genital HSV    gets on hip  . Heart murmur     echo 05/2011 mild aortic stenosis  . HSV (herpes simplex virus) infection    on hip--on daily suppression  . Hypertension   . Hypothyroidism    took med 7 yrs after birth of 1st child   . Impaired glucose tolerance   . Migraine   . On home oxygen therapy    "have it available but I'm not using it" (05/15/2017)  . Osteopenia   . Pneumonia ~ 1950; 08/06/2010  . Type 2 diabetes mellitus ([place])   . Ventral hernia   . Vitamin D deficiency disease     Past Surgical History:  Procedure Laterality Date  . ABDOMINAL HERNIA REPAIR  05/15/2017  . BREAST BIOPSY Right 1992  . BREAST BIOPSY Left 2018  . BREAST EXCISIONAL BIOPSY Left 2018   ATYPICAL DUCTAL HYPERPLASIA INVOLVING A COMPLEX  . BREAST LUMPECTOMY WITH RADIOACTIVE SEED LOCALIZATION Left 12/22/2016   Procedure: LEFT BREAST LUMPECTOMY WITH RADIOACTIVE SEED LOCALIZATION;  Surgeon: Jovita Kussmaul, MD;  Location: [facility];  Service: General;  Laterality: Left;  . CATARACT EXTRACTION, BILATERAL Bilateral R 03/16/2019 L 03/30/2019   Dr. Kathlen Mody  . COLONOSCOPY  2009, 08/2010, 12/2015   Dr. Collene Mares; "only 1 had any polyps" (05/15/2017)  . CYSTOSCOPY  04/23/2017  . CYSTOSCOPY W/ RETROGRADES Bilateral 01/12/2017   Procedure: CYSTOSCOPY WITH RETROGRADE PYELOGRAM/ EXAM UNDER ANESTHESIA;  Surgeon: Raynelle Bring, MD;  [facility];  Service: Urology;  Laterality: Bilateral;  . HERNIA REPAIR    . INSERTION OF MESH N/A 05/15/2017  Procedure: INSERTION OF MESH;  Surgeon: Jovita Kussmaul, MD;  Location: [place];  Service: General;  Laterality: N/A;  . LAPAROSCOPIC CHOLECYSTECTOMY  2003  . MASTECTOMY Right 1992  . TRANSURETHRAL RESECTION OF BLADDER TUMOR WITH MITOMYCIN-C N/A 01/12/2017   Procedure: TRANSURETHRAL RESECTION OF BLADDER TUMOR WITH POSSIBLE POST OPERATIVE INSTILLATION OF MITOMYCIN-C;  Surgeon: Raynelle Bring, MD;  [facility];  Service: Urology;  Laterality: N/A;  . TYMPANOSTOMY TUBE PLACEMENT Bilateral 1980s  . UMBILICAL HERNIA REPAIR  2003  . VENTRAL HERNIA REPAIR N/A 05/15/2017   Procedure: VENTRAL HERNIA REPAIR WITH MESH;  Surgeon: Jovita Kussmaul, MD;  Location[facility];  Service: General;  Laterality: N/A;    Family History   Problem Relation Age of Onset  . Heart disease Father   . Diabetes Father   . Hypertension Father   . Asthma Sister   . Allergies Sister   . Hyperlipidemia Sister   . Hashimoto's thyroiditis Sister   . Fibromyalgia Sister   . Heart disease Mother        tachycardia  . Asthma Sister   . Allergies Sister   . Hyperlipidemia Sister   . Hashimoto's thyroiditis Sister   . Fibromyalgia Sister   . Breast cancer Sister 22       BRCA2 positive; metastatic to bones at age 76, then spread to liver  . Cancer Other        female cancers, bone cancer  . Cancer Maternal Grandmother        deceased 43; unk. primary; possibly stomach  . Tuberculosis Maternal Grandfather   . Stroke Paternal Grandmother 2       died of cerebral hemorrhage    Social History:  reports that she quit smoking about 8 years ago. Her smoking use included cigarettes. She has a 46.00 pack-year smoking history. She has never used smokeless tobacco. She reports that she does not drink alcohol or use drugs.  Allergies:  Allergies  Allergen Reactions  . Contrast Media [Iodinated Diagnostic Agents] Anaphylaxis, Shortness Of Breath and Other (See Comments)    Could not breath  . Iohexol Anaphylaxis, Shortness Of Breath and Other (See Comments)    Immediately could not breathe  . Lisinopril Anaphylaxis, Shortness Of Breath and Rash  . Sulfa Antibiotics Anaphylaxis and Other (See Comments)    Historical from mother, pt states that mother says she almost died from this drug  . Latex Other (See Comments)    Unless against on skin for a long time, blisters. Short term is okay.  . Codeine Nausea Only, Anxiety and Other (See Comments)    insomnia  . Levofloxacin Other (See Comments)    insomnia  . Lipitor [Atorvastatin Calcium] Rash  . Vicodin [Hydrocodone-Acetaminophen] Itching    Medications: I have reviewed the patient's current medications.  Results for orders placed or performed during the hospital encounter of  08/23/19 (from the past 48 hour(s))  Basic metabolic panel     Status: Abnormal   Collection Time: 08/23/19  7:58 PM  Result Value Ref Range   Sodium 138 135 - 145 mmol/L   Potassium 3.9 3.5 - 5.1 mmol/L   Chloride 103 98 - 111 mmol/L   CO2 24 22 - 32 mmol/L   Glucose, Bld 166 (H) 70 - 99 mg/dL    Comment: Glucose reference range applies only to samples taken after fasting for at least 8 hours.   BUN 23 8 - 23 mg/dL   Creatinine, Ser 0.64 0.44 -  1.00 mg/dL   Calcium 9.1 8.9 - 10.3 mg/dL   GFR calc non Af Amer >60 >60 mL/min   GFR calc Af Amer >60 >60 mL/min   Anion gap 11 5 - 15    Comment: Performed at [facility], [place] [address], [place], [place] [address]  CBC with Differential     Status: Abnormal   Collection Time: 08/23/19  7:58 PM  Result Value Ref Range   WBC 7.1 4.0 - 10.5 K/uL   RBC 4.41 3.87 - 5.11 MIL/uL   Hemoglobin 13.0 12.0 - 15.0 g/dL   HCT 40.0 36.0 - 46.0 %   MCV 90.7 80.0 - 100.0 fL   MCH 29.5 26.0 - 34.0 pg   MCHC 32.5 30.0 - 36.0 g/dL   RDW 14.5 11.5 - 15.5 %   Platelets 116 (L) 150 - 400 K/uL    Comment: REPEATED TO VERIFY PLATELET COUNT CONFIRMED BY SMEAR SPECIMEN CHECKED FOR CLOTS Immature Platelet Fraction may be clinically indicated, consider ordering this additional test [id number]    nRBC 0.0 0.0 - 0.2 %   Neutrophils Relative % 85 %   Neutro Abs 6.1 1.7 - 7.7 K/uL   Lymphocytes Relative 8 %   Lymphs Abs 0.6 (L) 0.7 - 4.0 K/uL   Monocytes Relative 4 %   Monocytes Absolute 0.3 0.1 - 1.0 K/uL   Eosinophils Relative 1 %   Eosinophils Absolute 0.0 0.0 - 0.5 K/uL   Basophils Relative 1 %   Basophils Absolute 0.0 0.0 - 0.1 K/uL   Immature Granulocytes 1 %   Abs Immature Granulocytes 0.05 0.00 - 0.07 K/uL    Comment: Performed at [facility], [place] [address] [place], [place] [address]    DG Shoulder Right  Result Date: 08/23/2019 CLINICAL DATA:  Fall EXAM: RIGHT SHOULDER - 2+ VIEW COMPARISON:  None.  FINDINGS: Anterior shoulder dislocation. Probable fracture of the greater tuberosity. AC joint normal alignment IMPRESSION: Anterior shoulder dislocation. Anterior dislocation of shoulder. Probable fracture of the greater tuberosity. Electronically Signed   By: Franchot Gallo M.D.   On: 08/23/2019 19:37   DG Hip Unilat With Pelvis 2-3 Views Right  Result Date: 08/23/2019 CLINICAL DATA:  Fall EXAM: DG HIP (WITH OR WITHOUT PELVIS) 2-3V RIGHT COMPARISON:  None. FINDINGS: Intertrochanteric fracture right femur. Avulsion lesser trochanter. Mild joint space narrowing of both hips. No other pelvic fracture identified. IMPRESSION: Right intertrochanteric fracture. Electronically Signed   By: Franchot Gallo M.D.   On: 08/23/2019 19:38    ROS: No recent fever, chills, nausea, vomiting or changes in her appetite PE:  Blood pressure (!) 142/73, pulse 94, temperature 98.1 F (36.7 C), temperature source Oral, resp. rate 18, height 5' 4" (1.626 m), weight 95.7 kg, SpO2 96 %. Well-nourished well-developed woman in no apparent distress.  Alert and oriented x4.  Mood and affect are normal.  Extraocular motions are intact.  Respirations are unlabored.  She is resting comfortably on a hospital gurney.  The right shoulder is tender to palpation but grossly symmetric to the contralateral side.  She has forward flexion of 105 degrees.  With abduction of 90 degrees she can externally rotate about 45 degrees.  She complains of some soreness but no significant pain.  No apprehension with external rotation and abduction.  Intact sensibility to light touch in the axillary nerve distribution.  No lymphadenopathy at the right axilla.  5 out of 5 strength in the radial, median and ulnar nerve distributions.  2+ radial pulse on the right.  The right lower extremity is shortened and externally rotated.  Skin is healthy and intact over the right lower extremity.  1+ dorsalis pedis pulse.  Normal sensibility to light touch in the right  foot.  5 out of 5 strength in plantarflexion and dorsiflexion of the ankle and toes.  Assessment/Plan: Right shoulder dislocation status post closed reduction with likely greater tuberosity fracture -we will get a CT scan of her right shoulder to evaluate the greater tuberosity fracture and confirm reduction.  She will be in a shoulder immobilizer for comfort.  Right hip intertrochanteric fracture -to the operating room tomorrow for intramedullary nailing.  Covid test has been sent from the emergency room.  Patient will be admitted to the hospitalist service for management of her medical comorbidities.  N.p.o. after midnight.  Hold blood thinners.  I explained the plan to the patient in detail.  She understands and agrees.  Shannon Schultz 08/23/2019, 9:34 PM

## 2019-08-23 NOTE — H&P (Signed)
Shannon Schultz TTS:[id number] DOB: 20-Feb-1946 DOA: 08/23/2019     PCP: [name], MD   Outpatient Specialists:  CARDS:  Dr. Sallyanne Kuster Pulmonary   Dr. Loanne Drilling    Patient arrived to ER on 08/23/19 at 1748  Patient coming from: home Lives With family    Chief Complaint:   Chief Complaint  Patient presents with  . Fall  . Shoulder Pain  . Hip Pain    HPI: Shannon Schultz is a 74 y.o. female with medical history significant of Covid infection in November COPD with chronic hypoxia, venous insufficiency, HTN, HLD, aortic valve stenosis, cirrhosis of the liver, thrombocytopenia, bladder cancer, diabetes type 2  Presented with fall happened today slipped in the kitchen landed on her right hip and right shoulder on the floor noted to have significant rotation of her right leg and deformity to right shoulder.  Prior history of rotator cuff injury.  EMS was called noted to be satting 94% on room air fentanyl given Patient thinks it was secondary to tripping no head injury or loss of consciousness.  She is not on any blood thinners.  She has extensive medical problems.  No headache no neck stiffness She was not found for 1 hour, until her husband came home She only uses oxygen rarely  Received her [place] immunization for Covid on 20 February 21 She had no side-effects  She can walk a flight of stair nd to her mail box without significant shortness of breath or chest pain   Infectious risk factors:  Reports  shortness of breath rarely      in house  PCR testing  Pending  Lab Results  Component Value Date   SARSCOV2NAA Detected (A) 05/05/2019     Regarding pertinent Chronic problems:     Hyperlipidemia -  on statins Zocor   HTN on bisoprolol hydrochlorothiazide   Aortic stenosis echogram in 2019 showing moderate stenosis followed by cardiology showed normal EF last echo in December 2020 showing worsening aortic stenosis    DM 2 -  Lab Results  Component Value Date   HGBA1C  5.4 04/04/2019   on   PO meds only, diet controlled     Morbid obesity-   BMI Readings from Last 1 Encounters:  08/23/19 36.22 kg/m     COPD - followed by pulmonology  on   oxygen  2L as needed, rarly used   PF Readings from Last 1 Encounters:  09/29/17 230 L/min  FEV1 of 52%.    OSA -supposed to be on nocturnal oxygen, but does not tolerate   While in ER: Found to have anterior shoulder dislocation and fracture which was corrected in ER Right intertrochanteric   fracture Nondisplaced fractures of the superolateral humeral head with involvement of the greater tubercle.   ER Provider Called:  ortho   Dr. Doran Durand They Recommend admit to medicine     seen in ER    The following Work up has been ordered so far:  Orders Placed This Encounter  Procedures  . Sedation procedure  . Reduction of dislocation  . SARS CORONAVIRUS 2 (TAT 6-24 HRS) Nasopharyngeal Nasopharyngeal Swab  . DG Shoulder Right  . DG Hip Unilat With Pelvis 2-3 Views Right  . DG Shoulder Right Portable  . CT Shoulder Right Wo Contrast  . Basic metabolic panel  . CBC with Differential  . Procedural sedation  . Height and weight  . Consult to orthopedic surgery  ALL PATIENTS BEING ADMITTED/HAVING PROCEDURES  NEED COVID-19 SCREENING  . Consult to hospitalist  ALL PATIENTS BEING ADMITTED/HAVING PROCEDURES NEED COVID-19 SCREENING  . ED EKG  . EKG 12-Lead    Following Medications were ordered in ER: Medications  propofol (DIPRIVAN) 10 mg/mL bolus/IV push 47.9 mg (has no administration in time range)  propofol (DIPRIVAN) 10 mg/mL bolus/IV push (has no administration in time range)  fentaNYL (SUBLIMAZE) injection 50 mcg (50 mcg Intravenous Given 08/23/19 1917)  propofol (DIPRIVAN) 10 mg/mL bolus/IV push (50 mcg Intravenous Given 08/23/19 2116)        Consult Orders  (From admission, onward)         Start     Ordered   08/23/19 2130  Consult to hospitalist  ALL PATIENTS BEING ADMITTED/HAVING PROCEDURES NEED  COVID-19 SCREENING  Once    Comments: ALL PATIENTS BEING ADMITTED/HAVING PROCEDURES NEED COVID-19 SCREENING  Provider:  (Not yet assigned)  Question Answer Comment  Place call to: [facility]   Reason for Consult Admit      08/23/19 2129           Significant initial  Findings: Abnormal Labs Reviewed  BASIC METABOLIC PANEL - Abnormal; Notable for the following components:      Result Value   Glucose, Bld 166 (*)    All other components within normal limits  CBC WITH DIFFERENTIAL/PLATELET - Abnormal; Notable for the following components:   Platelets 116 (*)    Lymphs Abs 0.6 (*)    All other components within normal limits     Otherwise labs showing:    Recent Labs  Lab 08/23/19 1958  NA 138  K 3.9  CO2 24  GLUCOSE 166*  BUN 23  CREATININE 0.64  CALCIUM 9.1    Cr   Stable,  Lab Results  Component Value Date   CREATININE 0.64 08/23/2019   CREATININE 0.73 04/04/2019   CREATININE 0.70 09/13/2018    No results for input(s): AST, ALT, ALKPHOS, BILITOT, PROT, ALBUMIN in the last 168 hours. Lab Results  Component Value Date   CALCIUM 9.1 08/23/2019   PHOS 2.9 06/07/2011       WBC       Component Value Date/Time   WBC 7.1 08/23/2019 1958   ANC    Component Value Date/Time   NEUTROABS 6.1 08/23/2019 1958   NEUTROABS 3.4 04/04/2019 0830   ALC No components found for: LYMPHAB    Plt: Lab Results  Component Value Date   PLT 116 (L) 08/23/2019      COVID-19 Labs  No results for input(s): DDIMER, FERRITIN, LDH, CRP in the last 72 hours.  Lab Results  Component Value Date   SARSCOV2NAA Detected (A) 05/05/2019    HG/HCT  Stable,     Component Value Date/Time   HGB 13.0 08/23/2019 1958   HGB 14.0 04/04/2019 0830   HCT 40.0 08/23/2019 1958   HCT 44.0 04/04/2019 0830    No results for input(s): LIPASE, AMYLASE in the last 168 hours. No results for input(s): AMMONIA in the last 168 hours.  No components found for: LABALBU   ECG:  Ordered Personally reviewed by me showing: HR :95 Rhythm:  NSR,    no evidence of ischemic changes QTC 488    DM  labs:  HbA1C: Recent Labs    09/13/18 0830 04/04/19 0830  HGBA1C 6.4* 5.4       CBG (last 3)  No results for input(s): GLUCAP in the last 72 hours.     UA not ordered  Ordered    CXR -  NON acute R shoulder Nondisplaced fractures of the superolateral humeral head with involvement of the greater tubercle. No dislocation. R femur - introchanteric fracture  ED Triage Vitals  Enc Vitals Group     BP 08/23/19 1810 (!) 172/101     Pulse Rate 08/23/19 1810 90     Resp 08/23/19 1810 18     Temp 08/23/19 1810 98.1 F (36.7 C)     Temp Source 08/23/19 1810 Oral     SpO2 08/23/19 1810 94 %     Weight 08/23/19 2052 211 lb (95.7 kg)     Height 08/23/19 2052 '5\' 4"'$  (1.626 m)     Head Circumference --      Peak Flow --      Pain Score 08/23/19 1811 10     Pain Loc --      Pain Edu? --      Excl. in [place]? --   TMAX(24)@       Latest  Blood pressure (!) 142/73, pulse 94, temperature 98.1 F (36.7 C), temperature source Oral, resp. rate 18, height '5\' 4"'$  (1.626 m), weight 95.7 kg, SpO2 96 %.     Hospitalist was called for admission for right trochanteric fracture   Review of Systems:    Pertinent positives include: right shoulder and right femur pain  Constitutional:  No weight loss, night sweats, Fevers, chills, fatigue, weight loss  HEENT:  No headaches, Difficulty swallowing,Tooth/dental problems,Sore throat,  No sneezing, itching, ear ache, nasal congestion, post nasal drip,  Cardio-vascular:  No chest pain, Orthopnea, PND, anasarca, dizziness, palpitations.no Bilateral lower extremity swelling  GI:  No heartburn, indigestion, abdominal pain, nausea, vomiting, diarrhea, change in bowel habits, loss of appetite, melena, blood in stool, hematemesis Resp:  no shortness of breath at rest. No dyspnea on exertion, No excess mucus, no productive cough, No  non-productive cough, No coughing up of blood.No change in color of mucus.No wheezing. Skin:  no rash or lesions. No jaundice GU:  no dysuria, change in color of urine, no urgency or frequency. No straining to urinate.  No flank pain.  Musculoskeletal:  No joint pain or no joint swelling. No decreased range of motion. No back pain.  Psych:  No change in mood or affect. No depression or anxiety. No memory loss.  Neuro: no localizing neurological complaints, no tingling, no weakness, no double vision, no gait abnormality, no slurred speech, no confusion  All systems reviewed and apart from [place] all are negative  Past Medical History:   Past Medical History:  Diagnosis Date  . Aortic stenosis    mild-mod by 04/08/16 echo; mod AS 04/2017  . Arthritis    "hands" (05/15/2017)  . Bladder cancer ([place]) 2018  . Breast cancer, right ([place]) 1992   DCIS,bladder ca (just dx)  . Colon polyp   . Complication of anesthesia 1992   "local anesthesia" used was hard to awaken from-no problems since (05/15/2017)  . COPD (chronic obstructive pulmonary disease) ([place])   . COVID-19 virus infection 05/05/2019  . Diverticulosis   . Dyspnea   . Elevated cholesterol   . Elevated liver enzymes    fatty liver per ultrasound per pt  . Environmental allergies    "I have allergies year round" (05/15/2017)  . Family history of malignant neoplasm of breast   . FHx: BRCA2 gene positive    sister with BRCA2 mutation (pt tested NEGATIVE)  . Genital HSV    gets on  hip  . Heart murmur     echo 05/2011 mild aortic stenosis  . HSV (herpes simplex virus) infection    on hip--on daily suppression  . Hypertension   . Hypothyroidism    took med 7 yrs after birth of 1st child  . Impaired glucose tolerance   . Migraine   . On home oxygen therapy    "have it available but I'm not using it" (05/15/2017)  . Osteopenia   . Pneumonia ~ 1950; 08/06/2010  . Type 2 diabetes mellitus ([place])   . Ventral hernia   . Vitamin  D deficiency disease       Past Surgical History:  Procedure Laterality Date  . ABDOMINAL HERNIA REPAIR  05/15/2017  . BREAST BIOPSY Right 1992  . BREAST BIOPSY Left 2018  . BREAST EXCISIONAL BIOPSY Left 2018   ATYPICAL DUCTAL HYPERPLASIA INVOLVING A COMPLEX  . BREAST LUMPECTOMY WITH RADIOACTIVE SEED LOCALIZATION Left 12/22/2016   Procedure: LEFT BREAST LUMPECTOMY WITH RADIOACTIVE SEED LOCALIZATION;  Surgeon: Jovita Kussmaul, MD;  Location: [facility];  Service: General;  Laterality: Left;  . CATARACT EXTRACTION, BILATERAL Bilateral R 03/16/2019 L 03/30/2019   Dr. Kathlen Mody  . COLONOSCOPY  2009, 08/2010, 12/2015   Dr. Collene Mares; "only 1 had any polyps" (05/15/2017)  . CYSTOSCOPY  04/23/2017  . CYSTOSCOPY W/ RETROGRADES Bilateral 01/12/2017   Procedure: CYSTOSCOPY WITH RETROGRADE PYELOGRAM/ EXAM UNDER ANESTHESIA;  Surgeon: Raynelle Bring, MD;  Location: [facility];  Service: Urology;  Laterality: Bilateral;  . HERNIA REPAIR    . INSERTION OF MESH N/A 05/15/2017   Procedure: INSERTION OF MESH;  Surgeon: Jovita Kussmaul, MD;  Location: [facility];  Service: General;  Laterality: N/A;  . LAPAROSCOPIC CHOLECYSTECTOMY  2003  . MASTECTOMY Right 1992  . TRANSURETHRAL RESECTION OF BLADDER TUMOR WITH MITOMYCIN-C N/A 01/12/2017   Procedure: TRANSURETHRAL RESECTION OF BLADDER TUMOR WITH POSSIBLE POST OPERATIVE INSTILLATION OF MITOMYCIN-C;  Surgeon: Raynelle Bring, MD;  Location: [facility];  Service: Urology;  Laterality: N/A;  . TYMPANOSTOMY TUBE PLACEMENT Bilateral 1980s  . UMBILICAL HERNIA REPAIR  2003  . VENTRAL HERNIA REPAIR N/A 05/15/2017   Procedure: VENTRAL HERNIA REPAIR WITH MESH;  Surgeon: Jovita Kussmaul, MD;  Location: [facility];  Service: General;  Laterality: N/A;    Social History:  Ambulatory  independently      reports that she quit smoking about 8 years ago. Her smoking use included cigarettes. She has a 46.00 pack-year smoking history. She has never used smokeless tobacco. She reports that she does not drink  alcohol or use drugs.    Family History:  Family History  Problem Relation Age of Onset  . Heart disease Father   . Diabetes Father   . Hypertension Father   . Asthma Sister   . Allergies Sister   . Hyperlipidemia Sister   . Hashimoto's thyroiditis Sister   . Fibromyalgia Sister   . Heart disease Mother        tachycardia  . Asthma Sister   . Allergies Sister   . Hyperlipidemia Sister   . Hashimoto's thyroiditis Sister   . Fibromyalgia Sister   . Breast cancer Sister 67       BRCA2 positive; metastatic to bones at age 43, then spread to liver  . Cancer Other        female cancers, bone cancer  . Cancer Maternal Grandmother        deceased 64; unk. primary; possibly stomach  . Tuberculosis Maternal Grandfather   .  Stroke Paternal Grandmother 22       died of cerebral hemorrhage    Allergies: Allergies  Allergen Reactions  . Contrast Media [Iodinated Diagnostic Agents] Anaphylaxis, Shortness Of Breath and Other (See Comments)    Could not breath  . Iohexol Anaphylaxis, Shortness Of Breath and Other (See Comments)    Immediately could not breathe  . Lisinopril Anaphylaxis, Shortness Of Breath and Rash  . Sulfa Antibiotics Anaphylaxis and Other (See Comments)    Historical from mother, pt states that mother says she almost died from this drug  . Latex Other (See Comments)    Unless against on skin for a long time, blisters. Short term is okay.  . Codeine Nausea Only, Anxiety and Other (See Comments)    insomnia  . Levofloxacin Other (See Comments)    insomnia  . Lipitor [Atorvastatin Calcium] Rash  . Vicodin [Hydrocodone-Acetaminophen] Itching     Prior to Admission medications   Medication Sig Start Date End Date Taking? Authorizing Provider  acetaminophen (TYLENOL) 500 MG tablet Take 1,000 mg every 6 (six) hours as needed by mouth for moderate pain or headache.    Yes [provider]  albuterol (PROVENTIL) (2.5 MG/3ML) 0.083% nebulizer solution USE 1 VIAL  BY NEBULIZATION EVERY FOUR HOURS AS NEEDED FOR WHEEZING OR SHORTNESS OF BREATH. 05/06/19  Yes Tysinger, Camelia Eng, PA-C  albuterol (VENTOLIN HFA) 108 (90 Base) MCG/ACT inhaler Inhale 2 puffs into the lungs every 6 (six) hours as needed for wheezing or shortness of breath. 03/28/19  Yes [name], MD  aspirin EC 81 MG tablet Take 81 mg daily by mouth.   Yes [provider]  bisoprolol-hydrochlorothiazide [facility]) 10-6.25 MG tablet TAKE 1 TABLET EVERY DAY 06/23/19  Yes [name], MD  CALCIUM-MAGNESIUM-VITAMIN D PO Take 1 tablet by mouth in the morning and at bedtime.    Yes [provider]  cetirizine (KLS ALLER-TEC) 10 MG tablet Take 10 mg by mouth daily.   Yes [provider]  fluticasone (FLONASE) 50 MCG/ACT nasal spray USE 2 SPRAYS IN EACH NOSTRIL EVERY DAY 01/11/18  Yes [name], MD  furosemide (LASIX) 20 MG tablet TAKE 1 TABLET (20 MG TOTAL) BY MOUTH DAILY AS NEEDED FOR FLUID OR EDEMA. 10/27/17  Yes [name], MD  metFORMIN (GLUCOPHAGE-XR) 500 MG 24 hr tablet Take 1 tablet (500 mg total) by mouth 3 (three) times daily. 04/07/19  Yes [name], MD  simvastatin (ZOCOR) 20 MG tablet Take 1 tablet (20 mg total) by mouth at bedtime. 04/07/19  Yes [name], MD  triamcinolone cream (KENALOG) 0.1 % Apply 1 application topically 2 (two) times daily as needed (redness).  12/07/18  Yes [provider]  umeclidinium-vilanterol (ANORO ELLIPTA) 62.5-25 MCG/INH AEPB Inhale 1 puff into the lungs daily. 07/22/19  Yes [name], MD  ACCU-CHEK AVIVA PLUS test strip TEST BLOOD SUGAR ONE TO TWO TIMES DAILY 11/11/18   [name], MD  neomycin-polymyxin b-dexamethasone (MAXITROL) 3.5-10000-0.1 OINT APPLY TO RIGHT EYE BID UTD 02/25/19   [provider]  [place] TEST BLOOD SUGAR ONE TO TWO TIMES DAILY 04/07/19   [name], MD   Physical Exam: Blood pressure (!) 142/73, pulse 94, temperature 98.1 F (36.7 C), temperature source Oral, resp. rate 18,  height '5\' 4"'$  (1.626 m), weight 95.7 kg, SpO2 96 %. 1. General:  in   Acute distress complaining of severe pain    Chronically ill  -appearing 2. Psychological: Alert and   Oriented  3. Head/ENT:     Dry Mucous Membranes                          Head Non traumatic, neck supple                           Poor Dentition 4. SKIN:  decreased Skin turgor,  Skin clean Dry and intact no rash 5. Heart: Regular rate and rhythm no  Murmur, no Rub or gallop 6. Lungs:   no wheezes or crackles   7. Abdomen: Soft,  non-tender, Non distended bowel sounds present 8. Lower extremities: no clubbing, cyanosis, no edema 9. Neurologically Grossly intact, moving all 4 extremities equally  10. MSK: Normal range of motion   All other LABS:     Recent Labs  Lab 08/23/19 1958  WBC 7.1  NEUTROABS 6.1  HGB 13.0  HCT 40.0  MCV 90.7  PLT 116*     Recent Labs  Lab 08/23/19 1958  NA 138  K 3.9  CL 103  CO2 24  GLUCOSE 166*  BUN 23  CREATININE 0.64  CALCIUM 9.1     No results for input(s): AST, ALT, ALKPHOS, BILITOT, PROT, ALBUMIN in the last 168 hours.     Cultures:    Component Value Date/Time   SDES ABSCESS ABDOMEN 09/01/2017 0848   SPECREQUEST RLQ 09/01/2017 0848   CULT  09/01/2017 0848    No growth aerobically or anaerobically. Performed at [facility], [place] [address], [place], [place] [address]    REPTSTATUS 09/06/2017 FINAL 09/01/2017 0848     Radiological Exams on Admission: DG Shoulder Right  Result Date: 08/23/2019 CLINICAL DATA:  Fall EXAM: RIGHT SHOULDER - 2+ VIEW COMPARISON:  None. FINDINGS: Anterior shoulder dislocation. Probable fracture of the greater tuberosity. AC joint normal alignment IMPRESSION: Anterior shoulder dislocation. Anterior dislocation of shoulder. Probable fracture of the greater tuberosity. Electronically Signed   By: Franchot Gallo M.D.   On: 08/23/2019 19:37   DG Hip Unilat With Pelvis 2-3 Views Right  Result Date: 08/23/2019 CLINICAL DATA:   Fall EXAM: DG HIP (WITH OR WITHOUT PELVIS) 2-3V RIGHT COMPARISON:  None. FINDINGS: Intertrochanteric fracture right femur. Avulsion lesser trochanter. Mild joint space narrowing of both hips. No other pelvic fracture identified. IMPRESSION: Right intertrochanteric fracture. Electronically Signed   By: Franchot Gallo M.D.   On: 08/23/2019 19:38    Chart has been reviewed    Assessment/Plan  74 y.o. female with medical history significant of Covid infection in November COPD with chronic hypoxia, venous insufficiency, HTN, HLD, aortic valve stenosis, cirrhosis of the liver, thrombocytopenia, bladder cancer, diabetes type 2 Admitted for right hip fracture  Present on Admission: . Closed right hip fracture ([place]) -  - management as per orthopedics,  plan to operate  in  a.m.   Keep nothing by mouth post midnight. Patient  not on anticoagulation baby aspirin  on hold Ordered type and screen,   order a vitamin D level  Patient at baseline able to walk a flight of stairs or 100 feet   Patient denies any chest pain or shortness of breath currently and/or with exertion,  ECG showing no evidence of acute ischemia  no known history of coronary artery disease but hx of COPD aortic stenosis and cirrohsis  Given advanced age patient is at least moderate  Risk which has been discussed with patient but at this point  no furthther cardiac workup is indicated.  Had recent echogram  Cardiology consult for further pre-op clearance given progressive AS    . Pure hypercholesterolemia  -chronic continue home medications . Essential hypertension, benign -restart beta-blocker when able to tolerate p.o. . Morbid obesity ([place]) we will need nutritional consult as an outpatient . Cirrhosis of liver without ascites (HCC) -check INR current appears to be stable . COPD, group B, by GOLD 2017 classification ([place]) on oxygen on as needed current appears to be stable continue to monitor will need to be taking in account by  anesthesia Have tolerated hernia repair in the past Aortic stenosis followed by cardiology.  Last echogram showing some worsening. Per Cardiology note, will notify cardiology if patient would benefit from cardiac clearance prior to OR Other plan as per orders.  DVT prophylaxis:  SCD    Code Status:  FULL CODE  as per patient  I had personally discussed CODE STATUS with patient    Family Communication:   Family not at  Bedside    Disposition Plan:   likely will need placement for rehabilitation                                             Would benefit from PT/OT eval prior to DC                      Swallow eval - SLP ordered                   Social Work  consulted                                        Consults called: orthopedics, email cardiology   Admission status:  ED Disposition    None        inpatient     Expect 2 midnight stay secondary to severity of patient's current illness including    Severe lab/radiological/exam abnormalities including:  Hip fractur   and extensive comorbidities including:  Chronic pain  DM2   COPD/asthma  Morbid Obesity   That are currently affecting medical management. I expect  patient to be hospitalized for 2 midnights requiring inpatient medical care.  Patient is at high risk for adverse outcome (such as loss of life or disability) if not treated.  Indication for inpatient stay as follows:    inability to maintain oral hydration    Need for operative/procedural  intervention   Need for  IV pain medications,      Level of care        medical floor     Precautions: admitted as  asymptomatic screening protocol   PPE: Used by the provider:   P100  eye Goggles,  Gloves      Arcangel Minion 08/23/2019, 11:22 PM    [facility]     after 2 AM please page floor coverage PA If 7AM-7PM, please contact the day team taking care of the patient using [email or web address]   Patient was evaluated in the context of the global  COVID-19 pandemic, which necessitated consideration that the patient might be at risk for infection with the SARS-CoV-2 virus that causes COVID-19. Institutional protocols and algorithms that pertain to the evaluation of patients at risk for COVID-19  are in a state of rapid change based on information released by regulatory bodies including the CDC and federal and state organizations. These policies and algorithms were followed during the patient's care.

## 2019-08-23 NOTE — ED Notes (Signed)
Pt return to baseline A/O x4. able to move al extremities.

## 2019-08-23 NOTE — ED Provider Notes (Signed)
[facility] DEPT Provider Note   CSN: [id number] Arrival date & time: 08/23/19  1748     History Chief Complaint  Patient presents with  . Fall  . Shoulder Pain  . Hip Pain    Shannon Schultz is a 74 y.o. female with history of hyperlipidemia, hypertension, osteopenia, type 2 diabetes mellitus, right breast cancer presents for evaluation of acute onset, progressively worsening right shoulder and hip pain secondary to fall just prior to arrival.  She reports that she was walking in her kitchen when she thinks that she may have tripped on her shoe causing her to fall and land on her right side.  Denies head injury or loss of consciousness.  She is not on any blood thinners.  Reports immediate onset of severe pain to the right shoulder and right hip radiating down the right upper and lower extremities.  She reports feeling numbness and tingling in her right upper extremity.  Has not been able to move the joints very much since the fall.  She denies headache, neck pain, vision changes, shortness of breath, chest pain, or abdominal pain.  No nausea or vomiting.   The history is provided by the patient.       Past Medical History:  Diagnosis Date  . Aortic stenosis    mild-mod by 04/08/16 echo; mod AS 04/2017  . Arthritis    "hands" (05/15/2017)  . Bladder cancer ([place]) 2018  . Breast cancer, right ([place]) 1992   DCIS,bladder ca (just dx)  . Colon polyp   . Complication of anesthesia 1992   "local anesthesia" used was hard to awaken from-no problems since (05/15/2017)  . COPD (chronic obstructive pulmonary disease) ([place])   . COVID-19 virus infection 05/05/2019  . Diverticulosis   . Dyspnea   . Elevated cholesterol   . Elevated liver enzymes    fatty liver per ultrasound per pt  . Environmental allergies    "I have allergies year round" (05/15/2017)  . Family history of malignant neoplasm of breast   . FHx: BRCA2 gene positive    sister with BRCA2  mutation (pt tested NEGATIVE)  . Genital HSV    gets on hip  . Heart murmur     echo 05/2011 mild aortic stenosis  . HSV (herpes simplex virus) infection    on hip--on daily suppression  . Hypertension   . Hypothyroidism    took med 7 yrs after birth of 1st child  . Impaired glucose tolerance   . Migraine   . On home oxygen therapy    "have it available but I'm not using it" (05/15/2017)  . Osteopenia   . Pneumonia ~ 1950; 08/06/2010  . Type 2 diabetes mellitus ([place])   . Ventral hernia   . Vitamin D deficiency disease     Patient Active Problem List   Diagnosis Date Noted  . Closed right hip fracture ([place]) 08/23/2019  . Loss of taste 05/06/2019  . Comorbid condition 05/06/2019  . Close exposure to COVID-19 virus 05/06/2019  . Person under investigation for COVID-19 05/06/2019  . Cellulitis 09/10/2018  . Venous insufficiency 09/10/2018  . Sinusitis 06/25/2018  . Ophthalmic migraine 03/16/2018  . COPD, group B, by GOLD 2017 classification ([place]) 09/30/2017  . Chronic hypoxemic respiratory failure ([place]) 09/29/2017  . Ventral hernia without obstruction or gangrene 05/15/2017  . Cirrhosis of liver without ascites ([place]) 03/11/2017  . Urothelial cancer ([place]) 03/10/2017  . Atypical ductal hyperplasia of left breast 12/04/2016  .  Bladder mass 12/04/2016  . Atherosclerosis of coronary artery 09/14/2016  . Aortic atherosclerosis ([place]) 09/14/2016  . Morbid obesity ([place]) 11/10/2015  . Thrombocytopenia ([place]) 08/17/2015  . Elevated troponin 08/17/2015  . Type 2 diabetes mellitus ([place]) 08/17/2015  . Aortic stenosis 03/08/2015  . Severe obesity (BMI >= 40) ([place]) 10/06/2014  . FHx: BRCA2 gene positive   . HSV (herpes simplex virus) infection 06/07/2012  . Pure hypercholesterolemia 11/13/2010  . Essential hypertension, benign 11/13/2010    Past Surgical History:  Procedure Laterality Date  . ABDOMINAL HERNIA REPAIR  05/15/2017  . BREAST BIOPSY Right 1992  . BREAST BIOPSY Left 2018    . BREAST EXCISIONAL BIOPSY Left 2018   ATYPICAL DUCTAL HYPERPLASIA INVOLVING A COMPLEX  . BREAST LUMPECTOMY WITH RADIOACTIVE SEED LOCALIZATION Left 12/22/2016   Procedure: LEFT BREAST LUMPECTOMY WITH RADIOACTIVE SEED LOCALIZATION;  Surgeon: Jovita Kussmaul, MD;  Location: [facility];  Service: General;  Laterality: Left;  . CATARACT EXTRACTION, BILATERAL Bilateral R 03/16/2019 L 03/30/2019   Dr. Kathlen Mody  . COLONOSCOPY  2009, 08/2010, 12/2015   Dr. Collene Mares; "only 1 had any polyps" (05/15/2017)  . CYSTOSCOPY  04/23/2017  . CYSTOSCOPY W/ RETROGRADES Bilateral 01/12/2017   Procedure: CYSTOSCOPY WITH RETROGRADE PYELOGRAM/ EXAM UNDER ANESTHESIA;  Surgeon: Raynelle Bring, MD;  Location: [facility];  Service: Urology;  Laterality: Bilateral;  . HERNIA REPAIR    . INSERTION OF MESH N/A 05/15/2017   Procedure: INSERTION OF MESH;  Surgeon: Jovita Kussmaul, MD;  Location: [facility];  Service: General;  Laterality: N/A;  . LAPAROSCOPIC CHOLECYSTECTOMY  2003  . MASTECTOMY Right 1992  . TRANSURETHRAL RESECTION OF BLADDER TUMOR WITH MITOMYCIN-C N/A 01/12/2017   Procedure: TRANSURETHRAL RESECTION OF BLADDER TUMOR WITH POSSIBLE POST OPERATIVE INSTILLATION OF MITOMYCIN-C;  Surgeon: Raynelle Bring, MD;  Location: [facility];  Service: Urology;  Laterality: N/A;  . TYMPANOSTOMY TUBE PLACEMENT Bilateral 1980s  . UMBILICAL HERNIA REPAIR  2003  . VENTRAL HERNIA REPAIR N/A 05/15/2017   Procedure: VENTRAL HERNIA REPAIR WITH MESH;  Surgeon: Jovita Kussmaul, MD;  Location: [facility];  Service: General;  Laterality: N/A;     OB History    Gravida  3   Para  3   Term      Preterm      AB      Living  3     SAB      TAB      Ectopic      Multiple      Live Births              Family History  Problem Relation Age of Onset  . Heart disease Father   . Diabetes Father   . Hypertension Father   . Asthma Sister   . Allergies Sister   . Hyperlipidemia Sister   . Hashimoto's thyroiditis Sister   . Fibromyalgia Sister   .  Heart disease Mother        tachycardia  . Asthma Sister   . Allergies Sister   . Hyperlipidemia Sister   . Hashimoto's thyroiditis Sister   . Fibromyalgia Sister   . Breast cancer Sister 4       BRCA2 positive; metastatic to bones at age 11, then spread to liver  . Cancer Other        female cancers, bone cancer  . Cancer Maternal Grandmother        deceased 71; unk. primary; possibly stomach  . Tuberculosis Maternal Grandfather   .  Stroke Paternal Grandmother 59       died of cerebral hemorrhage    Social History   Tobacco Use  . Smoking status: Former Smoker    Packs/day: 1.00    Years: 46.00    Pack years: 46.00    Types: Cigarettes    Quit date: 05/31/2011    Years since quitting: 8.2  . Smokeless tobacco: Never Used  Substance Use Topics  . Alcohol use: No  . Drug use: No    Home Medications Prior to Admission medications   Medication Sig Start Date End Date Taking? Authorizing Provider  acetaminophen (TYLENOL) 500 MG tablet Take 1,000 mg every 6 (six) hours as needed by mouth for moderate pain or headache.    Yes [provider]  albuterol (PROVENTIL) (2.5 MG/3ML) 0.083% nebulizer solution USE 1 VIAL BY NEBULIZATION EVERY FOUR HOURS AS NEEDED FOR WHEEZING OR SHORTNESS OF BREATH. 05/06/19  Yes Tysinger, Camelia Eng, PA-C  albuterol (VENTOLIN HFA) 108 (90 Base) MCG/ACT inhaler Inhale 2 puffs into the lungs every 6 (six) hours as needed for wheezing or shortness of breath. 03/28/19  Yes [name], MD  aspirin EC 81 MG tablet Take 81 mg daily by mouth.   Yes [provider]  bisoprolol-hydrochlorothiazide [facility]) 10-6.25 MG tablet TAKE 1 TABLET EVERY DAY 06/23/19  Yes [name], MD  CALCIUM-MAGNESIUM-VITAMIN D PO Take 1 tablet by mouth in the morning and at bedtime.    Yes [provider]  cetirizine (KLS ALLER-TEC) 10 MG tablet Take 10 mg by mouth daily.   Yes [provider]  fluticasone (FLONASE) 50 MCG/ACT nasal spray USE 2  SPRAYS IN EACH NOSTRIL EVERY DAY 01/11/18  Yes [name], MD  furosemide (LASIX) 20 MG tablet TAKE 1 TABLET (20 MG TOTAL) BY MOUTH DAILY AS NEEDED FOR FLUID OR EDEMA. 10/27/17  Yes [name], MD  metFORMIN (GLUCOPHAGE-XR) 500 MG 24 hr tablet Take 1 tablet (500 mg total) by mouth 3 (three) times daily. 04/07/19  Yes [name], MD  simvastatin (ZOCOR) 20 MG tablet Take 1 tablet (20 mg total) by mouth at bedtime. 04/07/19  Yes [name], MD  triamcinolone cream (KENALOG) 0.1 % Apply 1 application topically 2 (two) times daily as needed (redness).  12/07/18  Yes [provider]  umeclidinium-vilanterol (ANORO ELLIPTA) 62.5-25 MCG/INH AEPB Inhale 1 puff into the lungs daily. 07/22/19  Yes [name], MD  ACCU-CHEK AVIVA PLUS test strip TEST BLOOD SUGAR ONE TO TWO TIMES DAILY 11/11/18   [name], MD  neomycin-polymyxin b-dexamethasone (MAXITROL) 3.5-10000-0.1 OINT APPLY TO RIGHT EYE BID UTD 02/25/19   [provider]  TRUEplus Lancets 30G MISC TEST BLOOD SUGAR ONE TO TWO TIMES DAILY 04/07/19   [name], MD    Allergies    Contrast media [iodinated diagnostic agents], Iohexol, Lisinopril, Sulfa antibiotics, Latex, Codeine, Levofloxacin, Lipitor [atorvastatin calcium], and Vicodin [hydrocodone-acetaminophen]  Review of Systems   Review of Systems  Constitutional: Negative for fever.  Respiratory: Negative for shortness of breath.   Cardiovascular: Negative for chest pain.  Gastrointestinal: Negative for abdominal pain, nausea and vomiting.  Musculoskeletal: Positive for arthralgias.  Neurological: Positive for numbness. Negative for syncope and headaches.  All other systems reviewed and are negative.   Physical Exam Updated Vital Signs BP (!) 155/92   Pulse 93   Temp 98.1 F (36.7 C) (Oral)   Resp 18   Ht '5\' 4"'  (1.626 m)   Wt 95.7 kg   LMP  (LMP  Unknown)   SpO2 93%   BMI 36.22 kg/m   Physical Exam Vitals and nursing note reviewed.  Constitutional:       General: She is not in acute distress.    Appearance: She is well-developed.  HENT:     Head: Normocephalic and atraumatic.     Comments: No Battle's signs, no raccoon's eyes, no rhinorrhea. No hemotympanum. No tenderness to palpation of the face or skull. No deformity, crepitus, or swelling noted.  Dentition is stable. Eyes:     General:        Right eye: No discharge.        Left eye: No discharge.     Conjunctiva/sclera: Conjunctivae normal.  Neck:     Vascular: No JVD.     Trachea: No tracheal deviation.     Comments: No midline cervical spine tenderness. Cardiovascular:     Rate and Rhythm: Normal rate.     Pulses: Normal pulses.     Comments: 2+ radial and DP/PT pulses bilaterally, Homans sign absent bilaterally, no lower extremity edema, no palpable cords, compartments are soft  Pulmonary:     Effort: Pulmonary effort is normal.  Chest:     Chest wall: No tenderness.  Abdominal:     General: There is no distension.     Palpations: Abdomen is soft.     Tenderness: There is no abdominal tenderness. There is no guarding or rebound.  Musculoskeletal:        General: Tenderness and deformity present.     Cervical back: Normal range of motion and neck supple.     Comments: Deformity noted to the right shoulder, tenderness to palpation to the posterior aspect of the right shoulder.  Limited active range of motion.  Tenderness to palpation of the anterior and lateral aspect of the right hip.  Right lower extremity is shortened and externally rotated.  Limited range of motion at the right hip.  5/5 strength with grip strength and with plantarflexion and dorsiflexion bilaterally.  Skin:    General: Skin is warm and dry.     Findings: No erythema.  Neurological:     Mental Status: She is alert.     Comments: Sensation intact to light touch of upper and lower extremities.  Cranial nerves are grossly intact.  Answers questions appropriately.  Speech is fluent and goal oriented.    Psychiatric:        Behavior: Behavior normal.     ED Results / Procedures / Treatments   Labs (all labs ordered are listed, but only abnormal results are displayed) Labs Reviewed  BASIC METABOLIC PANEL - Abnormal; Notable for the following components:      Result Value   Glucose, Bld 166 (*)    All other components within normal limits  CBC WITH DIFFERENTIAL/PLATELET - Abnormal; Notable for the following components:   Platelets 116 (*)    Lymphs Abs 0.6 (*)    All other components within normal limits  SARS CORONAVIRUS 2 (TAT 6-24 HRS)  PROTIME-INR    EKG EKG Interpretation  Date/Time:  Tuesday August 23 2019 19:19:11 EST Ventricular Rate:  95 PR Interval:    QRS Duration: 98 QT Interval:  388 QTC Calculation: 488 R Axis:   73 Text Interpretation: Sinus rhythm Low voltage, precordial leads Borderline prolonged QT interval No significant change since last tracing Confirmed by Dorie Rank [phone]) on 08/23/2019 7:37:18 PM   Radiology DG Shoulder Right  Result Date: 08/23/2019 CLINICAL DATA:  Fall EXAM:  RIGHT SHOULDER - 2+ VIEW COMPARISON:  None. FINDINGS: Anterior shoulder dislocation. Probable fracture of the greater tuberosity. AC joint normal alignment IMPRESSION: Anterior shoulder dislocation. Anterior dislocation of shoulder. Probable fracture of the greater tuberosity. Electronically Signed   By: Franchot Gallo M.D.   On: 08/23/2019 19:37   CT Shoulder Right Wo Contrast  Result Date: 08/23/2019 CLINICAL DATA:  74 year old female status post reduction of right shoulder dislocation. EXAM: CT OF THE UPPER RIGHT EXTREMITY WITHOUT CONTRAST TECHNIQUE: Multidetector CT imaging of the upper right extremity was performed according to the standard protocol. COMPARISON:  Earlier radiograph dated 08/23/2019. FINDINGS: Bones/Joint/Cartilage Evaluation is limited due to osteopenia. There is a poorly visualized nondisplaced fracture of the greater tubercle of the humerus (series 5  image 42 and series 6, image 56). Fracture of the superolateral humeral head (series 3 image 112 and series 10 image 41) most consistent with a Hill-Sachs injury. There is probable nondisplaced cortical fracture of the greater tuberosity of the humerus (series 10, image 35-32). There is no dislocation. The bones are osteopenic. No significant joint effusion. Ligaments Suboptimally assessed by CT. Muscles and Tendons There is edema and stranding of the soft tissues of the right axilla. No large fluid collection or hematoma. Soft tissues Soft tissue edema in the right axilla. IMPRESSION: Nondisplaced fractures of the superolateral humeral head with involvement of the greater tubercle. No dislocation. Electronically Signed   By: Anner Crete M.D.   On: 08/23/2019 22:05   DG CHEST PORT 1 VIEW  Result Date: 08/23/2019 CLINICAL DATA:  Right shoulder fracture dislocation after fall, preoperative evaluation EXAM: PORTABLE CHEST 1 VIEW COMPARISON:  09/29/2017 FINDINGS: Single frontal view of the chest demonstrates an unremarkable cardiac silhouette. No airspace disease, effusion, or pneumothorax. No acute displaced fracture. IMPRESSION: 1. No acute intrathoracic process. Electronically Signed   By: Randa Ngo M.D.   On: 08/23/2019 23:26   DG Shoulder Right Portable  Result Date: 08/23/2019 CLINICAL DATA:  Reduction of prior dislocation EXAM: PORTABLE RIGHT SHOULDER COMPARISON:  08/23/2019 FINDINGS: Single frontal view of the right shoulder was obtained, demonstrating reduction of the prior glenohumeral dislocation. Fracture of the superolateral aspect of the right humeral head is noted. Right chest is clear. IMPRESSION: 1. Reduction of prior dislocation. 2. Minimally displaced fracture superolateral aspect right humeral head. Electronically Signed   By: Randa Ngo M.D.   On: 08/23/2019 22:18   DG Hip Unilat With Pelvis 2-3 Views Right  Result Date: 08/23/2019 CLINICAL DATA:  Fall EXAM: DG HIP (WITH OR  WITHOUT PELVIS) 2-3V RIGHT COMPARISON:  None. FINDINGS: Intertrochanteric fracture right femur. Avulsion lesser trochanter. Mild joint space narrowing of both hips. No other pelvic fracture identified. IMPRESSION: Right intertrochanteric fracture. Electronically Signed   By: Franchot Gallo M.D.   On: 08/23/2019 19:38    Procedures Reduction of dislocation  Date/Time: 08/23/2019 9:30 PM Performed by: Renita Papa, PA-C Authorized by: Renita Papa, PA-C  Consent: Verbal consent obtained. Written consent obtained. Risks and benefits: risks, benefits and alternatives were discussed Consent given by: patient Patient understanding: patient states understanding of the procedure being performed Patient consent: the patient's understanding of the procedure matches consent given Procedure consent: procedure consent matches procedure scheduled Relevant documents: relevant documents present and verified Test results: test results available and properly labeled Site marked: the operative site was marked Imaging studies: imaging studies available Required items: required blood products, implants, devices, and special equipment available Patient identity confirmed: verbally with patient and arm band Time  out: Immediately prior to procedure a "time out" was called to verify the correct patient, procedure, equipment, support staff and site/side marked as required. Preparation: Patient was prepped and draped in the usual sterile fashion. Local anesthesia used: no  Anesthesia: Local anesthesia used: no  Sedation: Patient sedated: yes Sedation type: moderate (conscious) sedation Sedatives: propofol Analgesia: fentanyl Sedation start date/time: 08/23/2019 9:16 PM Sedation end date/time: 08/23/2019 9:30 PM Vitals: Vital signs were monitored during sedation.  Patient tolerance: patient tolerated the procedure well with no immediate complications    (including critical care time)  Medications  Ordered in ED Medications  propofol (DIPRIVAN) 10 mg/mL bolus/IV push 47.9 mg (has no administration in time range)  fentaNYL (SUBLIMAZE) injection 50 mcg (50 mcg Intravenous Given 08/23/19 1917)  propofol (DIPRIVAN) 10 mg/mL bolus/IV push (200 mg  Given 08/23/19 2116)  propofol (DIPRIVAN) 10 mg/mL bolus/IV push (50 mcg Intravenous Given 08/23/19 2116)  morphine 4 MG/ML injection 4 mg (4 mg Intravenous Given 08/23/19 2310)  morphine 2 MG/ML injection 2 mg (2 mg Intravenous Given 08/24/19 0026)    ED Course  I have reviewed the triage vital signs and the nursing notes.  Pertinent labs & imaging results that were available during my care of the patient were reviewed by me and considered in my medical decision making (see chart for details).    MDM Rules/Calculators/A&P                       Patient presenting for evaluation after mechanical fall.  Complains of right hip and shoulder pain.  She is afebrile, hypertensive, neurovascularly intact and compartments are soft.  Radiographs are significant for anterior dislocation of the right shoulder with probable fracture involving the greater tubercle of the humerus as well as right intertrochanteric fracture.  She was consciously sedated and the dislocation was successfully reduced.   CONSULT: Spoke with Dr. Doran Durand with orthopedics who will evaluate the patient emergently in the ED, plan for likely operative repair of the hip tomorrow.  Of note, patient had tested positive for Covid a little over 3 months ago, will obtain repeat Covid test.  She is asymptomatic from a Covid standpoint.  Lab work reviewed and interpreted by myself shows no leukocytosis, anemia, or metabolic derangements.  Spoke with Dr. Roel Cluck with [facility] hospitalist service who agrees to assume care of patient and bring her into the hospital for further evaluation and management.   Final Clinical Impression(s) / ED Diagnoses Final diagnoses:  Dislocation of right shoulder joint,  initial encounter  Closed fracture of right hip, initial encounter [facility])  Closed fracture of head of right humerus, initial encounter    Rx / DC Orders ED Discharge Orders    None       Debroah Baller 08/24/19 Jess Barters, MD 08/24/19 [phone]

## 2019-08-23 NOTE — ED Notes (Signed)
Pt able to move all extremites and have equal feeling in all extremities, all extremities warm to touch

## 2019-08-23 NOTE — ED Notes (Signed)
Pt transported to CT ?

## 2019-08-24 ENCOUNTER — Encounter (HOSPITAL_COMMUNITY): Payer: Self-pay | Admitting: Internal Medicine

## 2019-08-24 ENCOUNTER — Encounter (HOSPITAL_COMMUNITY)
Admission: EM | Disposition: A | Discharge status: Skilled Nursing Facility | Payer: Self-pay | Source: Home / Self Care | Attending: Family Medicine

## 2019-08-24 ENCOUNTER — Inpatient Hospital Stay (HOSPITAL_COMMUNITY): Payer: Medicare HMO | Admitting: Anesthesiology

## 2019-08-24 ENCOUNTER — Inpatient Hospital Stay (HOSPITAL_COMMUNITY): Payer: Medicare HMO

## 2019-08-24 HISTORY — PX: FEMUR IM NAIL: SHX1597

## 2019-08-24 LAB — COMPREHENSIVE METABOLIC PANEL
ALT: 38 U/L (ref 0–44)
AST: 54 U/L — ABNORMAL HIGH (ref 15–41)
Albumin: 4.1 g/dL (ref 3.5–5.0)
Alkaline Phosphatase: 65 U/L (ref 38–126)
Anion gap: 12 (ref 5–15)
BUN: 22 mg/dL (ref 8–23)
CO2: 23 mmol/L (ref 22–32)
Calcium: 9.1 mg/dL (ref 8.9–10.3)
Chloride: 102 mmol/L (ref 98–111)
Creatinine, Ser: 0.57 mg/dL (ref 0.44–1.00)
GFR calc Af Amer: >60 mL/min (ref >60)
GFR calc non Af Amer: >60 mL/min (ref >60)
Glucose, Bld: 157 mg/dL — ABNORMAL HIGH (ref 70–99)
Potassium: 4 mmol/L (ref 3.5–5.1)
Sodium: 137 mmol/L (ref 135–145)
Total Bilirubin: 1.5 mg/dL — ABNORMAL HIGH (ref 0.3–1.2)
Total Protein: 7.2 g/dL (ref 6.5–8.1)

## 2019-08-24 LAB — GLUCOSE, CAPILLARY
Glucose-Capillary: 150 mg/dL — ABNORMAL HIGH (ref 70–99)
Glucose-Capillary: 150 mg/dL — ABNORMAL HIGH (ref 70–99)
Glucose-Capillary: 151 mg/dL — ABNORMAL HIGH (ref 70–99)
Glucose-Capillary: 132 mg/dL — ABNORMAL HIGH (ref 70–99)
Glucose-Capillary: 153 mg/dL — ABNORMAL HIGH (ref 70–99)
Glucose-Capillary: 209 mg/dL — ABNORMAL HIGH (ref 70–99)

## 2019-08-24 LAB — CBC
HCT: 39.3 % (ref 36.0–46.0)
Hemoglobin: 12.6 g/dL (ref 12.0–15.0)
MCH: 29.4 pg (ref 26.0–34.0)
MCHC: 32.1 g/dL (ref 30.0–36.0)
MCV: 91.6 fL (ref 80.0–100.0)
Platelets: 117 10*3/uL — ABNORMAL LOW (ref 150–400)
RBC: 4.29 MIL/uL (ref 3.87–5.11)
RDW: 14.5 % (ref 11.5–15.5)
WBC: 6.7 10*3/uL (ref 4.0–10.5)
nRBC: 0 % (ref 0.0–0.2)

## 2019-08-24 LAB — TYPE AND SCREEN
ABO/RH(D): A POS
Antibody Screen: NEGATIVE

## 2019-08-24 LAB — SURGICAL PCR SCREEN
MRSA, PCR: NEGATIVE
Staphylococcus aureus: NEGATIVE

## 2019-08-24 LAB — VITAMIN D 25 HYDROXY (VIT D DEFICIENCY, FRACTURES): Vit D, 25-Hydroxy: 34.27 ng/mL (ref 30–100)

## 2019-08-24 LAB — SARS CORONAVIRUS 2 (TAT 6-24 HRS): SARS Coronavirus 2: POSITIVE — AB

## 2019-08-24 LAB — PROTIME-INR
INR: 1.1 (ref 0.8–1.2)
Prothrombin Time: 14.5 seconds (ref 11.4–15.2)

## 2019-08-24 LAB — HEMOGLOBIN A1C
Hgb A1c MFr Bld: 5.4 % (ref 4.8–5.6)
Mean Plasma Glucose: 108.28 mg/dL

## 2019-08-24 LAB — ABO/RH: ABO/RH(D): A POS

## 2019-08-24 SURGERY — INSERTION, INTRAMEDULLARY ROD, FEMUR
Anesthesia: General | Laterality: Right

## 2019-08-24 MED ORDER — SUCCINYLCHOLINE CHLORIDE 200 MG/10ML IV SOSY
PREFILLED_SYRINGE | INTRAVENOUS | Status: DC | PRN
  Administered 2019-08-24: 200 mg via INTRAVENOUS

## 2019-08-24 MED ORDER — METOCLOPRAMIDE HCL 5 MG PO TABS: 5.0000 mg | ORAL_TABLET | Freq: Three times a day (TID) | ORAL | Status: DC | PRN

## 2019-08-24 MED ORDER — PROPOFOL 10 MG/ML IV BOLUS
INTRAVENOUS | Status: AC
  Filled 2019-08-24: qty 20

## 2019-08-24 MED ORDER — ONDANSETRON HCL 4 MG PO TABS: 4.0000 mg | ORAL_TABLET | Freq: Four times a day (QID) | ORAL | Status: DC | PRN

## 2019-08-24 MED ORDER — FENTANYL CITRATE (PF) 100 MCG/2ML IJ SOLN
INTRAMUSCULAR | Status: DC | PRN
  Administered 2019-08-24: 50 ug via INTRAVENOUS
  Administered 2019-08-24: 100 ug via INTRAVENOUS
  Administered 2019-08-24: 50 ug via INTRAVENOUS
  Administered 2019-08-24 (×4): 25 ug via INTRAVENOUS

## 2019-08-24 MED ORDER — ROCURONIUM BROMIDE 10 MG/ML (PF) SYRINGE
PREFILLED_SYRINGE | INTRAVENOUS | Status: DC | PRN
  Administered 2019-08-24: 10 mg via INTRAVENOUS

## 2019-08-24 MED ORDER — LIDOCAINE 2% (20 MG/ML) 5 ML SYRINGE
INTRAMUSCULAR | Status: DC | PRN
  Administered 2019-08-24: 40 mg via INTRAVENOUS

## 2019-08-24 MED ORDER — ALBUTEROL SULFATE HFA 108 (90 BASE) MCG/ACT IN AERS: 2.0000 | INHALATION_SPRAY | Freq: Four times a day (QID) | RESPIRATORY_TRACT | Status: DC | PRN

## 2019-08-24 MED ORDER — ISOPROPYL ALCOHOL 70 % SOLN
Status: DC | PRN
  Administered 2019-08-24: 1 via TOPICAL

## 2019-08-24 MED ORDER — CEFAZOLIN SODIUM-DEXTROSE 2-4 GM/100ML-% IV SOLN
2.0000 g | INTRAVENOUS | Status: AC
  Administered 2019-08-24: 2 g via INTRAVENOUS
  Filled 2019-08-24: qty 100

## 2019-08-24 MED ORDER — ENOXAPARIN SODIUM 40 MG/0.4ML ~~LOC~~ SOLN
40.0000 mg | SUBCUTANEOUS | Status: DC
  Administered 2019-08-25 – 2019-08-31 (×7): 40 mg via SUBCUTANEOUS
  Filled 2019-08-24 (×7): qty 0.4

## 2019-08-24 MED ORDER — PROPOFOL 10 MG/ML IV BOLUS
INTRAVENOUS | Status: DC | PRN
  Administered 2019-08-24: 80 mg via INTRAVENOUS

## 2019-08-24 MED ORDER — CEFAZOLIN SODIUM-DEXTROSE 2-4 GM/100ML-% IV SOLN
2.0000 g | Freq: Four times a day (QID) | INTRAVENOUS | Status: AC
  Administered 2019-08-24 (×2): 2 g via INTRAVENOUS
  Filled 2019-08-24 (×2): qty 100

## 2019-08-24 MED ORDER — CHLORHEXIDINE GLUCONATE CLOTH 2 % EX PADS
6.0000 | MEDICATED_PAD | Freq: Once | CUTANEOUS | Status: AC
  Administered 2019-08-24: 07:00:00 6 via TOPICAL

## 2019-08-24 MED ORDER — LORATADINE 10 MG PO TABS
10.0000 mg | ORAL_TABLET | Freq: Every day | ORAL | Status: DC
  Administered 2019-08-24 – 2019-08-27 (×4): 10 mg via ORAL
  Filled 2019-08-24 (×5): qty 1

## 2019-08-24 MED ORDER — HYDROMORPHONE HCL 2 MG PO TABS
1.0000 mg | ORAL_TABLET | Freq: Four times a day (QID) | ORAL | Status: DC | PRN
  Administered 2019-08-24 – 2019-08-25 (×3): 1 mg via ORAL
  Filled 2019-08-24 (×3): qty 1

## 2019-08-24 MED ORDER — METHOCARBAMOL 1000 MG/10ML IJ SOLN
500.0000 mg | Freq: Four times a day (QID) | INTRAVENOUS | Status: DC | PRN
  Filled 2019-08-24: qty 5

## 2019-08-24 MED ORDER — METOCLOPRAMIDE HCL 5 MG/ML IJ SOLN: 5.0000 mg | Freq: Three times a day (TID) | INTRAMUSCULAR | Status: DC | PRN

## 2019-08-24 MED ORDER — ONDANSETRON HCL 4 MG/2ML IJ SOLN
INTRAMUSCULAR | Status: DC | PRN
  Administered 2019-08-24: 4 mg via INTRAVENOUS

## 2019-08-24 MED ORDER — MIDAZOLAM HCL 2 MG/2ML IJ SOLN
INTRAMUSCULAR | Status: AC
  Filled 2019-08-24: qty 2

## 2019-08-24 MED ORDER — SODIUM CHLORIDE 0.9 % IR SOLN
Status: DC | PRN
  Administered 2019-08-24: 1000 mL

## 2019-08-24 MED ORDER — POVIDONE-IODINE 10 % EX SWAB
2.0000 "application " | Freq: Once | CUTANEOUS | Status: AC
  Administered 2019-08-24: 2 via TOPICAL

## 2019-08-24 MED ORDER — MORPHINE SULFATE (PF) 2 MG/ML IV SOLN
2.0000 mg | Freq: Once | INTRAVENOUS | Status: AC
  Administered 2019-08-24: 2 mg via INTRAVENOUS
  Filled 2019-08-24: qty 1

## 2019-08-24 MED ORDER — PHENYLEPHRINE 40 MCG/ML (10ML) SYRINGE FOR IV PUSH (FOR BLOOD PRESSURE SUPPORT)
PREFILLED_SYRINGE | INTRAVENOUS | Status: DC | PRN
  Administered 2019-08-24: 40 ug via INTRAVENOUS
  Administered 2019-08-24 (×3): 120 ug via INTRAVENOUS

## 2019-08-24 MED ORDER — FENTANYL CITRATE (PF) 250 MCG/5ML IJ SOLN
INTRAMUSCULAR | Status: AC
  Filled 2019-08-24: qty 5

## 2019-08-24 MED ORDER — MORPHINE SULFATE (PF) 2 MG/ML IV SOLN
0.5000 mg | INTRAVENOUS | Status: DC | PRN
  Administered 2019-08-24 (×2): 0.5 mg via INTRAVENOUS
  Filled 2019-08-24 (×2): qty 1

## 2019-08-24 MED ORDER — MIDAZOLAM HCL 5 MG/5ML IJ SOLN
INTRAMUSCULAR | Status: DC | PRN
  Administered 2019-08-24: .5 mg via INTRAVENOUS

## 2019-08-24 MED ORDER — FENTANYL CITRATE (PF) 100 MCG/2ML IJ SOLN
INTRAMUSCULAR | Status: AC
  Filled 2019-08-24: qty 2

## 2019-08-24 MED ORDER — ONDANSETRON HCL 4 MG/2ML IJ SOLN
INTRAMUSCULAR | Status: AC
  Filled 2019-08-24: qty 2

## 2019-08-24 MED ORDER — KCL IN DEXTROSE-NACL 20-5-0.45 MEQ/L-%-% IV SOLN: INTRAVENOUS | Status: DC

## 2019-08-24 MED ORDER — CHLORHEXIDINE GLUCONATE CLOTH 2 % EX PADS
6.0000 | MEDICATED_PAD | Freq: Every day | CUTANEOUS | Status: DC
  Administered 2019-08-25 – 2019-08-28 (×4): 6 via TOPICAL

## 2019-08-24 MED ORDER — SIMVASTATIN 20 MG PO TABS
20.0000 mg | ORAL_TABLET | Freq: Every day | ORAL | Status: DC
  Administered 2019-08-24 – 2019-08-30 (×7): 20 mg via ORAL
  Filled 2019-08-24 (×7): qty 1

## 2019-08-24 MED ORDER — LACTATED RINGERS IV SOLN: INTRAVENOUS | Status: DC | PRN

## 2019-08-24 MED ORDER — FENTANYL CITRATE (PF) 100 MCG/2ML IJ SOLN: 25.0000 ug | INTRAMUSCULAR | Status: DC | PRN

## 2019-08-24 MED ORDER — MAGIC MOUTHWASH
5.0000 mL | Freq: Three times a day (TID) | ORAL | Status: DC
  Administered 2019-08-24 – 2019-08-31 (×21): 5 mL via ORAL
  Filled 2019-08-24 (×23): qty 5

## 2019-08-24 MED ORDER — ENSURE PRE-SURGERY PO LIQD
296.0000 mL | Freq: Once | ORAL | Status: DC
  Filled 2019-08-24: qty 296

## 2019-08-24 MED ORDER — ONDANSETRON HCL 4 MG/2ML IJ SOLN
4.0000 mg | Freq: Four times a day (QID) | INTRAMUSCULAR | Status: DC | PRN
  Filled 2019-08-24: qty 2

## 2019-08-24 MED ORDER — PHENYLEPHRINE HCL-NACL 10-0.9 MG/250ML-% IV SOLN: INTRAVENOUS | Status: DC | PRN

## 2019-08-24 MED ORDER — CHLORHEXIDINE GLUCONATE 4 % EX LIQD
60.0000 mL | Freq: Once | CUTANEOUS | Status: AC
  Administered 2019-08-24: 4 via TOPICAL
  Filled 2019-08-24: qty 15

## 2019-08-24 MED ORDER — PHENOL 1.4 % MT LIQD
1.0000 | OROMUCOSAL | Status: DC | PRN
  Filled 2019-08-24 (×2): qty 177

## 2019-08-24 MED ORDER — SUGAMMADEX SODIUM 200 MG/2ML IV SOLN
INTRAVENOUS | Status: DC | PRN
  Administered 2019-08-24: 200 mg via INTRAVENOUS

## 2019-08-24 MED ORDER — METHOCARBAMOL 500 MG PO TABS
500.0000 mg | ORAL_TABLET | Freq: Four times a day (QID) | ORAL | Status: DC | PRN
  Administered 2019-08-24 – 2019-08-30 (×12): 500 mg via ORAL
  Filled 2019-08-24 (×13): qty 1

## 2019-08-24 MED ORDER — DEXAMETHASONE SODIUM PHOSPHATE 10 MG/ML IJ SOLN
INTRAMUSCULAR | Status: AC
  Filled 2019-08-24: qty 1

## 2019-08-24 MED ORDER — MENTHOL 3 MG MT LOZG
1.0000 | LOZENGE | OROMUCOSAL | Status: DC | PRN
  Filled 2019-08-24: qty 9

## 2019-08-24 MED ORDER — CEFAZOLIN SODIUM-DEXTROSE 2-4 GM/100ML-% IV SOLN
INTRAVENOUS | Status: AC
  Filled 2019-08-24: qty 100

## 2019-08-24 MED ORDER — INSULIN ASPART 100 UNIT/ML ~~LOC~~ SOLN
0.0000 [IU] | SUBCUTANEOUS | Status: DC
  Administered 2019-08-24: 1 [IU] via SUBCUTANEOUS
  Administered 2019-08-24: 3 [IU] via SUBCUTANEOUS
  Administered 2019-08-24: 2 [IU] via SUBCUTANEOUS
  Administered 2019-08-24: 1 [IU] via SUBCUTANEOUS
  Administered 2019-08-24: 2 [IU] via SUBCUTANEOUS
  Administered 2019-08-25 (×2): 1 [IU] via SUBCUTANEOUS
  Administered 2019-08-25 (×2): 2 [IU] via SUBCUTANEOUS
  Administered 2019-08-25 – 2019-08-26 (×4): 1 [IU] via SUBCUTANEOUS
  Administered 2019-08-26: 2 [IU] via SUBCUTANEOUS
  Administered 2019-08-26: 1 [IU] via SUBCUTANEOUS
  Administered 2019-08-26: 2 [IU] via SUBCUTANEOUS
  Administered 2019-08-27 (×3): 1 [IU] via SUBCUTANEOUS

## 2019-08-24 MED ORDER — UMECLIDINIUM-VILANTEROL 62.5-25 MCG/INH IN AEPB
1.0000 | INHALATION_SPRAY | Freq: Every day | RESPIRATORY_TRACT | Status: DC
  Administered 2019-08-24 – 2019-08-31 (×8): 1 via RESPIRATORY_TRACT
  Filled 2019-08-24 (×2): qty 14

## 2019-08-24 MED ORDER — ACETAMINOPHEN 500 MG PO TABS
1000.0000 mg | ORAL_TABLET | Freq: Once | ORAL | Status: AC
  Administered 2019-08-24: 11:00:00 1000 mg via ORAL
  Filled 2019-08-24: qty 2

## 2019-08-24 MED ORDER — CEFAZOLIN SODIUM-DEXTROSE 2-3 GM-%(50ML) IV SOLR
INTRAVENOUS | Status: DC | PRN
  Administered 2019-08-24: 2 g via INTRAVENOUS

## 2019-08-24 MED ORDER — LACTATED RINGERS IV SOLN: INTRAVENOUS | Status: DC

## 2019-08-24 MED ORDER — PHENYLEPHRINE HCL-NACL 10-0.9 MG/250ML-% IV SOLN
INTRAVENOUS | Status: DC | PRN
  Administered 2019-08-24: 30 ug/min via INTRAVENOUS
  Administered 2019-08-24: 25 ug/min via INTRAVENOUS

## 2019-08-24 MED ORDER — DOCUSATE SODIUM 100 MG PO CAPS
100.0000 mg | ORAL_CAPSULE | Freq: Two times a day (BID) | ORAL | Status: DC
  Administered 2019-08-24 – 2019-08-26 (×5): 100 mg via ORAL
  Filled 2019-08-24 (×6): qty 1

## 2019-08-24 MED ORDER — SODIUM CHLORIDE 0.9 % IV SOLN: INTRAVENOUS | Status: DC

## 2019-08-24 MED ORDER — DEXAMETHASONE SODIUM PHOSPHATE 10 MG/ML IJ SOLN
INTRAMUSCULAR | Status: DC | PRN
  Administered 2019-08-24: 6 mg via INTRAVENOUS

## 2019-08-24 MED ORDER — ALBUTEROL SULFATE (2.5 MG/3ML) 0.083% IN NEBU: 2.5000 mg | INHALATION_SOLUTION | Freq: Four times a day (QID) | RESPIRATORY_TRACT | Status: DC | PRN

## 2019-08-24 MED ORDER — ALBUTEROL SULFATE HFA 108 (90 BASE) MCG/ACT IN AERS
INHALATION_SPRAY | RESPIRATORY_TRACT | Status: AC
  Filled 2019-08-24: qty 6.7

## 2019-08-24 SURGICAL SUPPLY — 52 items
ADH SKN CLS APL DERMABOND .7 (GAUZE/BANDAGES/DRESSINGS) ×2
APL PRP STRL LF DISP 70% ISPRP (MISCELLANEOUS) ×1
BAG SPEC THK2 15X12 ZIP CLS (MISCELLANEOUS)
BAG ZIPLOCK 12X15 (MISCELLANEOUS) IMPLANT
BIT DRILL AO GAMMA 4.2X340 (BIT) ×1 IMPLANT
CHLORAPREP W/TINT 26 (MISCELLANEOUS) ×2 IMPLANT
COVER PERINEAL POST (MISCELLANEOUS) ×2 IMPLANT
COVER SURGICAL LIGHT HANDLE (MISCELLANEOUS) ×2 IMPLANT
COVER WAND RF STERILE (DRAPES) IMPLANT
DERMABOND ADVANCED (GAUZE/BANDAGES/DRESSINGS) ×2
DERMABOND ADVANCED .7 DNX12 (GAUZE/BANDAGES/DRESSINGS) ×1 IMPLANT
DRAPE C-ARM 42X120 X-RAY (DRAPES) ×2 IMPLANT
DRAPE C-ARMOR (DRAPES) ×2 IMPLANT
DRAPE SHEET LG 3/4 BI-LAMINATE (DRAPES) ×2 IMPLANT
DRAPE STERI IOBAN 125X83 (DRAPES) ×2 IMPLANT
DRAPE U-SHAPE 47X51 STRL (DRAPES) ×4 IMPLANT
DRSG AQUACEL AG ADV 3.5X 4 (GAUZE/BANDAGES/DRESSINGS) ×1 IMPLANT
DRSG AQUACEL AG ADV 3.5X 6 (GAUZE/BANDAGES/DRESSINGS) ×1 IMPLANT
DRSG MEPILEX BORDER 4X4 (GAUZE/BANDAGES/DRESSINGS) ×2 IMPLANT
DRSG MEPILEX BORDER 4X8 (GAUZE/BANDAGES/DRESSINGS) ×2 IMPLANT
FACESHIELD WRAPAROUND (MASK) ×6 IMPLANT
FACESHIELD WRAPAROUND OR TEAM (MASK) ×3 IMPLANT
GAUZE SPONGE 4X4 12PLY STRL (GAUZE/BANDAGES/DRESSINGS) ×2 IMPLANT
GLOVE BIO SURGEON STRL SZ8.5 (GLOVE) ×4 IMPLANT
GLOVE BIOGEL M STRL SZ7.5 (GLOVE) ×2 IMPLANT
GLOVE BIOGEL PI IND STRL 8.5 (GLOVE) ×1 IMPLANT
GLOVE BIOGEL PI INDICATOR 8.5 (GLOVE) ×1
GLOVE INDICATOR 7.5 STRL GRN (GLOVE) ×2 IMPLANT
GOWN SPEC L3 XXLG W/TWL (GOWN DISPOSABLE) ×2 IMPLANT
GOWN STRL REUS W/TWL XL LVL3 (GOWN DISPOSABLE) ×2 IMPLANT
HOLDER FOLEY CATH W/STRAP (MISCELLANEOUS) ×1 IMPLANT
K-WIRE  3.2X450M STR (WIRE) ×1
K-WIRE 3.2X450M STR (WIRE) ×1
KIT BASIN OR (CUSTOM PROCEDURE TRAY) ×2 IMPLANT
KIT TURNOVER KIT A (KITS) IMPLANT
KWIRE 3.2X450M STR (WIRE) IMPLANT
MANIFOLD NEPTUNE II (INSTRUMENTS) ×2 IMPLANT
MARKER SKIN DUAL TIP RULER LAB (MISCELLANEOUS) ×2 IMPLANT
NAIL TROCH GAMMA 3 KT (Nail) ×1 IMPLANT
PACK TOTAL JOINT (CUSTOM PROCEDURE TRAY) ×2 IMPLANT
PENCIL SMOKE EVACUATOR (MISCELLANEOUS) IMPLANT
SCREW LAG GAMMA 3 TI 10.5X100M (Screw) ×1 IMPLANT
SCREW LOCKING T2 F/T  5MMX35MM (Screw) ×1 IMPLANT
SCREW LOCKING T2 F/T 5MMX35MM (Screw) IMPLANT
SUT MNCRL AB 3-0 PS2 18 (SUTURE) ×2 IMPLANT
SUT MON AB 2-0 CT1 36 (SUTURE) ×2 IMPLANT
SUT VIC AB 1 CT1 27 (SUTURE) ×2
SUT VIC AB 1 CT1 27XBRD ANTBC (SUTURE) ×1 IMPLANT
TOWEL OR 17X26 10 PK STRL BLUE (TOWEL DISPOSABLE) ×2 IMPLANT
TOWEL OR NON WOVEN STRL DISP B (DISPOSABLE) ×2 IMPLANT
TRAY FOLEY MTR SLVR 14FR STAT (SET/KITS/TRAYS/PACK) ×2 IMPLANT
YANKAUER SUCT BULB TIP NO VENT (SUCTIONS) ×2 IMPLANT

## 2019-08-24 NOTE — Progress Notes (Addendum)
Spoke with Neale Burly, CRNA concerning patients current positive COVID test.  According to anesthesiology we will treat this patient as if she is negative despite the recent positive test.  Patient was originally positive back in November, and has since received one vaccine.  She appears to be asymptomatic per Neale Burly, CRNA.  Will treat according to their plan in main PACU, not in isolation.

## 2019-08-24 NOTE — Progress Notes (Signed)
Hospitalist progress note   Patient from home, Patient going home likely, Dispo dependent on physical therapy and work-up  Shannon Schultz [id number] DOB: August 16, 1945 DOA: 08/23/2019  PCP: [name], MD   Narrative:  22 white femaleCOPD with chronic hypoxia does not use chronic oxygen-last PFT 09/2017 FEV1 to 2%, HTN, HLD, aortic stenosis with moderate/severe stenosis echo 05/2019, TCP, DM TY 2, BMI 36, breast cancer status post lumpectomy 2019-bladder cancer status post resection intravesical chemo She had Covid in November She received the [place] vaccine 08/20/2019  Sustained accidental fall found to have closed right hip fracture-baseline meds 3-5   Data Reviewed:  BUN/creatinine 22/0.5 which is close to baseline Potassium 4 LFTs AST 54/ALT 38 Hemoglobin 12.6 platelet 117 CXR 2/23 shows no intrathoracic process   Assessment & Plan:  Coronavirus positive result Patient is going to remain positive-this should not have a bearing on further clinical management-we will continue to test + repeat monitoring Closed hip fracture Right shoulder dislocation status post closed reduction with likely greater tuberosity fracture and shoulder immobilizer in place Status post surgery today 2/24-rest of care as per orthopedics including follow-up control weightbearing status etc. DM TY  2 A1c 5.4 CBG ranging 130-150--monitor trends-can resume Metformin 500 tid and keep SSI Moderate stable acute stenosis Needs repeat monitoring and further echocardiogram probably in the next 6 months to a year to denote progression-unclear if good surgical candidate coont asa 81 Gold stage II-III COPD FEV1 52% 2019 Requires outpatient follow-up with her pulmonologist Cont Anoro Ellipta, albuteral, flonase HTN Holding Ziac for now-monitor tredns Breast cancer status post lumpectomy Bladder cancer status post resection chemo BMI 36   Subjective: Awake alert in and no focal deficit Awake Eating drinking   Consultants: Orthopedics  Objective: Vitals:   08/24/19 0329 08/24/19 0519 08/24/19 0735 08/24/19 0950  BP: 133/75 125/63  140/69  Pulse: 87 85  92  Resp: 16 16    Temp: 98.5 F (36.9 C) 98.3 F (36.8 C)  98.1 F (36.7 C)  TempSrc: Oral Oral  Oral  SpO2: 98% 97% 94% 96%  Weight:      Height:        Intake/Output Summary (Last 24 hours) at 08/24/2019 0956 Last data filed at 08/24/2019 0953 Gross per 24 hour  Intake 204.71 ml  Output 350 ml  Net -145.29 ml   Filed Weights   08/23/19 2052  Weight: 95.7 kg    Examination: eomi ncat no focal deficit  s1 s2 no m/r/g abd soft nt nd no rebound no guard abd soft nt nd  No le edema Neuro intact smile symm  Scheduled Meds: . feeding supplement  296 mL Oral Once  . insulin aspart  0-9 Units Subcutaneous Q4H  . loratadine  10 mg Oral Daily  . povidone-iodine  2 application Topical Once  . simvastatin  20 mg Oral QHS  . umeclidinium-vilanterol  1 puff Inhalation Daily   Continuous Infusions: . sodium chloride 75 mL/hr at 08/24/19 0305  .  ceFAZolin (ANCEF) IV    . dextrose 5 % and 0.45 % NaCl with KCl 20 mEq/L    . methocarbamol (ROBAXIN) IV       LOS: 1 day   Time spent: [place], MD [facility]  08/24/2019, 9:56 AM

## 2019-08-24 NOTE — Op Note (Signed)
OPERATIVE REPORT  SURGEON: Rod Can, MD   ASSISTANT: Sherlean Foot, RNFA.  PREOPERATIVE DIAGNOSIS: Right intertrochanteric femur fracture.   POSTOPERATIVE DIAGNOSIS: Right intertrochanteric femur fracture.   PROCEDURE: Intramedullary fixation, Right femur.   IMPLANTS: Stryker Gamma3 Hip Fracture Nail, 11 by 180 mm, 130 degrees. 10.5 x 100 mm Hip Fracture Nail Lag Screw. 5 x 35 mm distal interlocking screw 1.  ANESTHESIA:  General  ESTIMATED BLOOD LOSS:-200 mL    ANTIBIOTICS: 2 g Ancef.  DRAINS: None.  COMPLICATIONS: None.   CONDITION: PACU - hemodynamically stable.Marland Kitchen   BRIEF CLINICAL NOTE: Shannon Schultz is a 74 y.o. female who presented with an intertrochanteric femur fracture and right anterior shoulder dislocation / nondisplaced greater tuberosity fracture. She underwent closed reduction of the shoulder, and a sling was placed. The patient was admitted to the hospitalist service and underwent perioperative risk stratification and medical optimization. The risks, benefits, and alternatives to the procedure were explained, and the patient elected to proceed.  PROCEDURE IN DETAIL: Surgical site was marked by myself. The patient was taken to the operating room and anesthesia was induced on the bed. The patient was then transferred to the [facility] table and the nonoperative lower extremity was scissored underneath the operative side. The fracture was reduced with traction, internal rotation, and adduction. The hip was prepped and draped in the normal sterile surgical fashion. Timeout was called verifying side and site of surgery. Preop antibiotics were given with 60 minutes of beginning the procedure.  Fluoroscopy was used to define the patient's anatomy. A 4 cm incision was made just proximal to the tip of the greater trochanter. The awl was used to obtain the standard starting point for a trochanteric entry nail under fluoroscopic control. The guidepin was placed. The entry reamer was  used to open the proximal femur.  On the back table, the nail was assembled onto the jig. The nail was placed into the femur without any difficulty. Through a separate stab incision, the cannula was placed down to the bone in preparation for the cephalomedullary device. A guidepin was placed into the femoral head using AP and lateral fluoroscopy views. The pin was measured, and then reaming was performed to the appropriate depth. The lag screw was inserted to the appropriate depth. The fracture was compressed through the jig. The setscrew was tightened and then loosened one quarter turn. A separate stab incision was created, and the distal interlocking screw was placed using standard AO technique. The jig was removed. Final AP and lateral fluoroscopy views were obtained to confirm fracture reduction and hardware placement. Tip apex distance was appropriate. There was no chondral penetration.  The wounds were copiously irrigated with saline. The wound was closed in layers with #1 Vicryl for the fascia, 2-0 Monocryl for the deep dermal layer, and 3-0 Monocryl subcuticular stitch. Glue was applied to the skin. Once the glue was fully hardened, sterile dressing was applied. The patient was then awakened from anesthesia and taken to the PACU in stable condition. Sponge needle and instrument counts were correct at the end of the case 2. There were no known complications.  We will readmit the patient to the hospitalist. Weightbearing status will be weightbearing as tolerated with a walker. Non weight bearing right upper extremity with sling. We will begin Lovenox for DVT prophylaxis. The patient will work with physical therapy and undergo disposition planning.  She will follow up with Drs. Rogers and Shannon Schultz in 2 weeks.

## 2019-08-24 NOTE — Anesthesia Procedure Notes (Addendum)
Arterial Line Insertion Start/End2/24/2021 11:30 AM Performed by: Freddrick March, MD  Patient location: Pre-op. Preanesthetic checklist: patient identified, IV checked, site marked, risks and benefits discussed, surgical consent, monitors and equipment checked, pre-op evaluation, timeout performed and anesthesia consent radial was placed Catheter size: 20 G  Attempts: 2 Procedure performed without using ultrasound guided technique. Following insertion, Biopatch and dressing applied. Post procedure assessment: normal  Patient tolerated the procedure well with no immediate complications.

## 2019-08-24 NOTE — Plan of Care (Signed)
  Problem: Clinical Measurements: Goal: Respiratory complications will improve Outcome: Progressing   Problem: Activity: Goal: Risk for activity intolerance will decrease Outcome: Progressing   Problem: Coping: Goal: Level of anxiety will decrease Outcome: Progressing   Problem: Pain Managment: Goal: General experience of comfort will improve Outcome: Progressing   Problem: Safety: Goal: Ability to remain free from injury will improve Outcome: Progressing   

## 2019-08-24 NOTE — Interval H&P Note (Signed)
History and Physical Interval Note:  08/24/2019 10:48 AM  Shannon Schultz  has presented today for surgery, with the diagnosis of fractured right hip.  The various methods of treatment have been discussed with the patient and family. After consideration of risks, benefits and other options for treatment, the patient has consented to  Procedure(s): INTRAMEDULLARY (IM) NAIL FEMORAL (Right) as a surgical intervention.  The patient's history has been reviewed, patient examined, no change in status, stable for surgery.  I have reviewed the patient's chart and labs.  Questions were answered to the patient's satisfaction.    The risks, benefits, and alternatives were discussed with the patient. There are risks associated with the surgery including, but not limited to, problems with anesthesia (death), infection, differences in leg length/angulation/rotation, fracture of bones, loosening or failure of implants, malunion, nonunion, hematoma (blood accumulation) which may require surgical drainage, blood clots, pulmonary embolism, nerve injury (foot drop), and blood vessel injury. The patient understands these risks and elects to proceed.    Hilton Cork Delmon Andrada

## 2019-08-24 NOTE — Discharge Instructions (Signed)
Dr. Rod Can Joint Replacement Specialist [facility] [address] [place], [place] [address] [phone]   HIP POSTOPERATIVE DIRECTIONS    Hip Rehabilitation, Guidelines Following Surgery   WEIGHT BEARING Weight bearing as tolerated with assist device (walker, cane, etc) as directed, use it as long as suggested by your surgeon or therapist, typically at least 4-6 weeks.   Non weight bearing Right Upper Extremity. Sling at all times. May remove sling for hygiene and dressing.  The results of a hip operation are greatly improved after range of motion and muscle strengthening exercises. Follow all safety measures which are given to protect your hip. If any of these exercises cause increased pain or swelling in your joint, decrease the amount until you are comfortable again. Then slowly increase the exercises. Call your caregiver if you have problems or questions.   HOME CARE INSTRUCTIONS  Most of the following instructions are designed to prevent the dislocation of your new hip.  Remove items at home which could result in a fall. This includes throw rugs or furniture in walking pathways.  Continue medications as instructed at time of discharge.  You may have some home medications which will be placed on hold until you complete the course of blood thinner medication.  You may start showering once you are discharged home. Do not remove your dressing. Do not put on socks or shoes without following the instructions of your caregivers.   Sit on chairs with arms. Use the chair arms to help push yourself up when arising.  Arrange for the use of a toilet seat elevator so you are not sitting low.   Walk with walker as instructed.  You may resume a sexual relationship in one month or when given the OK by your caregiver.  Use walker as long as suggested by your caregivers.  You may put full weight on your legs and walk as much as is comfortable. Avoid  periods of inactivity such as sitting longer than an hour when not asleep. This helps prevent blood clots.  You may return to work once you are cleared by Engineer, production.  Do not drive a car for 6 weeks or until released by your surgeon.  Do not drive while taking narcotics.  Wear elastic stockings for two weeks following surgery during the day but you may remove then at night.  Make sure you keep all of your appointments after your operation with all of your doctors and caregivers. You should call the office at the above phone number and make an appointment for approximately two weeks after the date of your surgery. Please pick up a stool softener and laxative for home use as long as you are requiring pain medications.  ICE to the affected hip every three hours for 30 minutes at a time and then as needed for pain and swelling. Continue to use ice on the hip for pain and swelling from surgery. You may notice swelling that will progress down to the foot and ankle.  This is normal after surgery.  Elevate the leg when you are not up walking on it.   It is important for you to complete the blood thinner medication as prescribed by your doctor.  Continue to use the breathing machine which will help keep your temperature down.  It is common for your temperature to cycle up and down following surgery, especially at night when you are not up moving around and exerting yourself.  The breathing machine keeps  your lungs expanded and your temperature down.  RANGE OF MOTION AND STRENGTHENING EXERCISES  These exercises are designed to help you keep full movement of your hip joint. Follow your caregiver's or physical therapist's instructions. Perform all exercises about fifteen times, three times per day or as directed. Exercise both hips, even if you have had only one joint replacement. These exercises can be done on a training (exercise) mat, on the floor, on a table or on a bed. Use whatever works the best and is  most comfortable for you. Use music or television while you are exercising so that the exercises are a pleasant break in your day. This will make your life better with the exercises acting as a break in routine you can look forward to.  Lying on your back, slowly slide your foot toward your buttocks, raising your knee up off the floor. Then slowly slide your foot back down until your leg is straight again.  Lying on your back spread your legs as far apart as you can without causing discomfort.  Lying on your side, raise your upper leg and foot straight up from the floor as far as is comfortable. Slowly lower the leg and repeat.  Lying on your back, tighten up the muscle in the front of your thigh (quadriceps muscles). You can do this by keeping your leg straight and trying to raise your heel off the floor. This helps strengthen the largest muscle supporting your knee.  Lying on your back, tighten up the muscles of your buttocks both with the legs straight and with the knee bent at a comfortable angle while keeping your heel on the floor.   SKILLED REHAB INSTRUCTIONS: If the patient is transferred to a skilled rehab facility following release from the hospital, a list of the current medications will be sent to the facility for the patient to continue.  When discharged from the skilled rehab facility, please have the facility set up the patient's [place] prior to being released. Also, the skilled facility will be responsible for providing the patient with their medications at time of release from the facility to include their pain medication and their blood thinner medication. If the patient is still at the rehab facility at time of the two week follow up appointment, the skilled rehab facility will also need to assist the patient in arranging follow up appointment in our office and any transportation needs.  MAKE SURE YOU:  Understand these instructions.  Will watch your condition.    Will get help right away if you are not doing well or get worse.  Pick up stool softner and laxative for home use following surgery while on pain medications. Do not remove your dressing. The dressing is waterproof--it is OK to take showers. Continue to use ice for pain and swelling after surgery. Do not use any lotions or creams on the incision until instructed by your surgeon. Weight bearing as tolerated right leg with walker. Non weight bearing Right Upper Extremity. Sling at all times. May remove sling for hygiene and dressing.

## 2019-08-24 NOTE — Anesthesia Preprocedure Evaluation (Addendum)
Anesthesia Evaluation  Patient identified by MRN, date of birth, ID band Patient awake    Reviewed: Allergy & Precautions, NPO status , Patient's Chart, lab work & pertinent test results, reviewed documented beta blocker date and time   Airway Mallampati: II  TM Distance: >3 FB Neck ROM: Full    Dental  (+) Teeth Intact, Dental Advisory Given   Pulmonary COPD (2L at night but doesn't wear it consistently),  COPD inhaler, former smoker,  COVID positive in Nov and now, first vaccine 08/20/19   Pulmonary exam normal breath sounds clear to auscultation       Cardiovascular hypertension, Pt. on medications and Pt. on home beta blockers + CAD  Normal cardiovascular exam+ Valvular Problems/Murmurs AS  Rhythm:Regular Rate:Normal  HLD  TTE 06/29/19 EF 65-70%, G1DD, mod to severe AS  Stress Test 2016 Nuclear stress EF: 71%. There was no ST segment deviation noted during stress. The study is normal. No evidence of ischemia. No evidence of infarction . This is a low risk study.   Neuro/Psych  Headaches, negative psych ROS   GI/Hepatic negative GI ROS, Neg liver ROS,   Endo/Other  diabetes, Type 2Hypothyroidism Obese BMI 36  Renal/GU negative Renal ROS  negative genitourinary   Musculoskeletal  (+) Arthritis ,   Abdominal   Peds  Hematology negative hematology ROS (+)   Anesthesia Other Findings   Reproductive/Obstetrics                           Anesthesia Physical Anesthesia Plan  ASA: IV  Anesthesia Plan: General   Post-op Pain Management:    Induction: Intravenous  PONV Risk Score and Plan: 3 and Midazolam, Dexamethasone and Ondansetron  Airway Management Planned: Oral ETT  Additional Equipment: Arterial line  Intra-op Plan:   Post-operative Plan: Extubation in OR  Informed Consent: I have reviewed the patients History and Physical, chart, labs and discussed the procedure  including the risks, benefits and alternatives for the proposed anesthesia with the patient or authorized representative who has indicated his/her understanding and acceptance.     Dental advisory given  Plan Discussed with: CRNA  Anesthesia Plan Comments:         Anesthesia Quick Evaluation

## 2019-08-24 NOTE — ED Notes (Signed)
Doutova Md requesting to monitor pt for 15-20 after receiving dose of morphine that she is putting in now before transporting pt to floor.

## 2019-08-24 NOTE — Anesthesia Postprocedure Evaluation (Signed)
Anesthesia Post Note  Patient: Shannon Schultz  Procedure(s) Performed: INTRAMEDULLARY (IM) NAIL FEMORAL (Right )     Patient location during evaluation: PACU Anesthesia Type: General Level of consciousness: awake and alert Pain management: pain level controlled Vital Signs Assessment: post-procedure vital signs reviewed and stable Respiratory status: spontaneous breathing, nonlabored ventilation, respiratory function stable and patient connected to nasal cannula oxygen Cardiovascular status: blood pressure returned to baseline and stable Postop Assessment: no apparent nausea or vomiting Anesthetic complications: no    Last Vitals:  Vitals:   08/24/19 1445 08/24/19 1500  BP: (!) 144/67 123/70  Pulse: 89 91  Resp: 15 16  Temp:  37.2 C  SpO2: 98% 97%    Last Pain:  Vitals:   08/24/19 1500  TempSrc:   PainSc: 0-No pain                 Axel Frisk L Leverne Amrhein

## 2019-08-24 NOTE — Anesthesia Procedure Notes (Signed)
Procedure Name: Intubation Date/Time: 08/24/2019 12:18 PM Performed by: Lissa Morales, CRNA Pre-anesthesia Checklist: Patient identified, Emergency Drugs available, Suction available and Patient being monitored Patient Re-evaluated:Patient Re-evaluated prior to induction Oxygen Delivery Method: Circle system utilized Preoxygenation: Pre-oxygenation with 100% oxygen Induction Type: IV induction and Rapid sequence Ventilation: Mask ventilation without difficulty Laryngoscope Size: Glidescope Grade View: Grade II Tube type: Oral Tube size: 7.0 mm Number of attempts: 1 Airway Equipment and Method: Stylet and Oral airway Placement Confirmation: ETT inserted through vocal cords under direct vision,  positive ETCO2 and breath sounds checked- equal and bilateral Secured at: 21 cm Tube secured with: Tape Dental Injury: Teeth and Oropharynx as per pre-operative assessment  Comments: Elective glidescope used secondary to + COVID since Novemeber. Posterior oral pharynx very eccymotic and  Bruised prior to intubation. Old bloody material  In back of throat prior to intubation. Pt states she was yelling at husband for  At least an hour to get help when she fell and broke her hip. Easy intubation and ataumatic with glidescope

## 2019-08-24 NOTE — Transfer of Care (Signed)
Immediate Anesthesia Transfer of Care Note  Patient: Shannon Schultz  Procedure(s) Performed: INTRAMEDULLARY (IM) NAIL FEMORAL (Right )  Patient Location: PACU  Anesthesia Type:General  Level of Consciousness: awake, alert , oriented and patient cooperative  Airway & Oxygen Therapy: Patient Spontanous Breathing and Patient connected to face mask oxygen  Post-op Assessment: Report given to RN, Post -op Vital signs reviewed and stable and Patient moving all extremities X 4  Post vital signs: stable  Last Vitals:  Vitals Value Taken Time  BP    Temp    Pulse    Resp    SpO2      Last Pain:  Vitals:   08/24/19 0950  TempSrc: Oral  PainSc: 8       Patients Stated Pain Goal: 3 ([id number])  Complications: No apparent anesthesia complications

## 2019-08-25 ENCOUNTER — Encounter: Payer: Self-pay | Admitting: *Deleted

## 2019-08-25 DIAGNOSIS — S72009A Fracture of unspecified part of neck of unspecified femur, initial encounter for closed fracture: Secondary | ICD-10-CM | POA: Diagnosis present

## 2019-08-25 LAB — CBC
HCT: 32.3 % — ABNORMAL LOW (ref 36.0–46.0)
Hemoglobin: 10.2 g/dL — ABNORMAL LOW (ref 12.0–15.0)
MCH: 28.9 pg (ref 26.0–34.0)
MCHC: 31.6 g/dL (ref 30.0–36.0)
MCV: 91.5 fL (ref 80.0–100.0)
Platelets: 127 10*3/uL — ABNORMAL LOW (ref 150–400)
RBC: 3.53 MIL/uL — ABNORMAL LOW (ref 3.87–5.11)
RDW: 14.7 % (ref 11.5–15.5)
WBC: 6.5 10*3/uL (ref 4.0–10.5)
nRBC: 0 % (ref 0.0–0.2)

## 2019-08-25 LAB — BASIC METABOLIC PANEL
Anion gap: 8 (ref 5–15)
BUN: 15 mg/dL (ref 8–23)
CO2: 25 mmol/L (ref 22–32)
Calcium: 8.6 mg/dL — ABNORMAL LOW (ref 8.9–10.3)
Chloride: 102 mmol/L (ref 98–111)
Creatinine, Ser: 0.56 mg/dL (ref 0.44–1.00)
GFR calc Af Amer: >60 mL/min (ref >60)
GFR calc non Af Amer: >60 mL/min (ref >60)
Glucose, Bld: 161 mg/dL — ABNORMAL HIGH (ref 70–99)
Potassium: 4.3 mmol/L (ref 3.5–5.1)
Sodium: 135 mmol/L (ref 135–145)

## 2019-08-25 LAB — GLUCOSE, CAPILLARY
Glucose-Capillary: 173 mg/dL — ABNORMAL HIGH (ref 70–99)
Glucose-Capillary: 145 mg/dL — ABNORMAL HIGH (ref 70–99)
Glucose-Capillary: 130 mg/dL — ABNORMAL HIGH (ref 70–99)
Glucose-Capillary: 181 mg/dL — ABNORMAL HIGH (ref 70–99)
Glucose-Capillary: 141 mg/dL — ABNORMAL HIGH (ref 70–99)
Glucose-Capillary: 149 mg/dL — ABNORMAL HIGH (ref 70–99)

## 2019-08-25 MED ORDER — OXYCODONE-ACETAMINOPHEN 5-325 MG PO TABS
1.0000 | ORAL_TABLET | ORAL | Status: DC | PRN
  Administered 2019-08-25 – 2019-08-26 (×4): 2 via ORAL
  Administered 2019-08-26: 1 via ORAL
  Administered 2019-08-27 – 2019-08-31 (×16): 2 via ORAL
  Administered 2019-08-31: 1 via ORAL
  Filled 2019-08-25 (×12): qty 2
  Filled 2019-08-25: qty 1
  Filled 2019-08-25 (×9): qty 2

## 2019-08-25 MED ORDER — BISOPROLOL FUMARATE 5 MG PO TABS
10.0000 mg | ORAL_TABLET | Freq: Every day | ORAL | Status: DC
  Administered 2019-08-25 – 2019-08-31 (×7): 10 mg via ORAL
  Filled 2019-08-25 (×8): qty 2

## 2019-08-25 MED ORDER — METFORMIN HCL ER 500 MG PO TB24
500.0000 mg | ORAL_TABLET | Freq: Three times a day (TID) | ORAL | Status: DC
  Administered 2019-08-25 – 2019-08-31 (×19): 500 mg via ORAL
  Filled 2019-08-25 (×16): qty 1

## 2019-08-25 MED ORDER — DIPHENHYDRAMINE HCL 50 MG/ML IJ SOLN
25.0000 mg | Freq: Once | INTRAMUSCULAR | Status: AC
  Administered 2019-08-25: 25 mg via INTRAVENOUS
  Filled 2019-08-25: qty 1

## 2019-08-25 NOTE — Progress Notes (Signed)
Rehab Admissions Coordinator Note:  Patient was screened by Cleatrice Burke for appropriateness for an Inpatient Acute Rehab Consult per PT recs. Noted COVID + 2/23. Patients are eligible when > 20 days since positive and they are recovered or improved form COVID. They do not require retesting for possible admission. Those patients who ar less than 20 days from onset still require 2 negative tests prior to possible admission, This patient does not yet meet that criteria. I will follow her progress.Cleatrice Burke RN MSN 08/25/2019, 1:33 PM  I can be reached at [phone].

## 2019-08-25 NOTE — Progress Notes (Addendum)
Occupational Therapy Evaluation  Clinical Impression: Patient reports living in a single story home with spouse and was Independent with all self-care tasks and functional mobility at PLOF. Overall, patient requires set-up to total assist for self-care tasks, Max A for supine<>sit x 2, Moderate A x 2 for sit to stand transfers with extra time. Education for patient on donning/doffing of R UE sling (worn for comfort) and positioning of R UE. Patient requires the use of 2L of O2 with stats dropping to 82% on room air after sit to supine. Patient required Moderate cues for PLB throughout O2 evaluation. Patient's heart rate increased to the 150s after sit to stand transfer and sit to supine. Nursing notified. Patient will benefit from continued skilled acute care OT services.     08/25/19 1057  OT Visit Information  Assistance Needed +2  Reason for Co-Treatment For patient/therapist safety;To address functional/ADL transfers  PT goals addressed during session Mobility/safety with mobility  OT goals addressed during session ADL's and self-care  History of Present Illness Shannon Schultz is a 74 y.o. female who presented with an intertrochanteric femur fracture and right anterior shoulder dislocation / nondisplaced greater tuberosity fracture. She underwent closed reduction of the shoulder, and a sling was placed. The patient was admitted to the hospitalist service and underwent perioperative risk stratification and medical optimization.  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 )  Bathroom Shower/Tub Walk-in shower  [place] - 4 wheels;Wheelchair - manual;Cane - single point Management consultant)  Prior Function  Level of Independence Independent  Communication  Communication No difficulties  Pain Assessment  Pain Assessment 0-10  Pain Score 7  Pain Location R HIP/SHOULDER  Pain Descriptors / Indicators Aching;Sore;Shooting  Pain Intervention(s) Limited activity within patient's tolerance  Cognition  Arousal/Alertness Awake/alert  Behavior During Therapy WFL for tasks assessed/performed  Overall Cognitive Status Within Functional Limits for tasks assessed  Upper Extremity Assessment  Upper Extremity Assessment RUE deficits/detail  RUE Deficits / Details not able to accurately assess due to immobilization  Lower Extremity Assessment  Lower Extremity Assessment Defer to PT evaluation  ADL  Overall ADL's  Needs assistance/impaired  Eating/Feeding Set up  Grooming Set up;Wash/dry face  Upper Body Bathing Moderate assistance  Lower Body Bathing Maximal assistance  Upper Body Dressing  Moderate assistance  Lower Body Dressing Maximal assistance  Toilet Transfer Maximal assistance;+2 for physical assistance;+2 for safety/equipment  Toileting- Clothing Manipulation and Hygiene Total assistance  Tub/ Shower Transfer Maximal assistance;+2 for physical assistance;+2 for safety/equipment  Functional mobility during ADLs  (patient was only able to complete sit to stand transfer, ADL)  Vision- History  Baseline Vision/History No visual deficits  Bed Mobility  Overal bed mobility Needs Assistance  Bed Mobility Supine to Sit;Sit to Supine  Supine to sit Mod assist;Max assist  Sit to supine Mod assist;Max assist  General bed mobility comments Increased time, cues for sequencing and PLB  Transfers  Overall transfer level Needs assistance  Transfers Sit to/from Stand  Sit to Stand Mod assist;+2 physical assistance  General transfer comment elevated bed   Balance  Overall  balance assessment Needs assistance  Sitting-balance support No upper extremity supported  Sitting balance-Leahy Scale Fair  Standing balance-Leahy Scale Poor  General Comments  General comments (skin integrity, edema, etc.) increased swelling in R hand and R LE  OT - End of Session  Activity Tolerance Patient limited by pain  Patient left in bed;with call bell/phone within reach;with nursing/sitter in room  Nurse Communication Mobility status  OT Assessment  OT Recommendation/Assessment Patient needs continued OT Services  OT Visit Diagnosis Muscle weakness (generalized) (M62.81);Unsteadiness on feet (R26.81)  OT Problem List Decreased strength;Decreased activity tolerance;Decreased safety awareness;Impaired balance (sitting and/or standing);Decreased knowledge of use of DME or AE;Decreased knowledge of precautions  OT Plan  OT Frequency (ACUTE ONLY) Min 2X/week  OT Treatment/Interventions (ACUTE ONLY) Self-care/ADL training;Therapeutic exercise;DME and/or AE instruction;Therapeutic activities;Energy conservation;Patient/family education;Balance training  AM-PAC OT "6 Clicks" Daily Activity Outcome Measure (Version 2)  Help from another person eating meals? 3  Help from another person taking care of personal grooming? 3  Help from another person toileting, which includes using toliet, bedpan, or urinal? 1  Help from another person bathing (including washing, rinsing, drying)? 2  Help from another person to put on and taking off regular upper body clothing? 2  Help from another person to put on and taking off regular lower body clothing? 2  6 Click Score 14  OT Recommendation  Follow Up Recommendations CIR  OT Equipment Other (comment) (shower chair )  Individuals Consulted  Consulted and Agree with Results and Recommendations Patient  Acute Rehab OT Goals  Patient Stated Goal see grandchildren  OT Goal Formulation With patient  Time For Goal Achievement 09/08/19  Potential to  Achieve Goals Fair  OT Time Calculation  OT Start Time (ACUTE ONLY) 1057  OT Stop Time (ACUTE ONLY) 1149  OT Time Calculation (min) 52 min  OT General Charges  $OT Visit 1 Visit  OT Evaluation  $OT Eval Moderate Complexity 1 Mod  OT Treatments  $Self Care/Home Management  8-22 mins  $Therapeutic Activity 8-22 mins  Maren Wiesen OTR/L

## 2019-08-25 NOTE — Progress Notes (Signed)
Dr Verlon Au paged again due to patient requesting medication for itching. Donne Hazel, RN

## 2019-08-25 NOTE — Progress Notes (Signed)
Hospitalist progress note   Patient from home, Patient going home likely, Dispo dependent on physical therapy and work-up  Shannon Schultz [id number] DOB: [birth year] DOA: 08/23/2019  PCP: [name], MD   Narrative:  77 white femaleCOPD with chronic hypoxia does not use chronic oxygen-last PFT 09/2017 FEV1 to 2%, HTN, HLD, aortic stenosis with moderate/severe stenosis echo 05/2019, TCP, DM TY 2, BMI 36, breast cancer status post lumpectomy 2019-bladder cancer status post resection intravesical chemo She had Covid in November She received the [place] vaccine 08/20/2019  Sustained accidental fall found to have closed right hip fracture-baseline meds 3-5   Data Reviewed:  BUN/creatinine 22/0.5-->15/0.5 Hemoglobin 12.6-->10.2 platelet 127 CXR 2/23 shows no intrathoracic process   Assessment & Plan:  Coronavirus positive result Patient is going to remain positive-this should not have a bearing on further clinical management-we will continue to test + repeat monitoring Closed hip fracture Right shoulder dislocation status post closed reduction with likely greater tuberosity fracture and shoulder immobilizer in place Status post surgery today 2/24-rest of care as per orthopedics including follow-up control weightbearing status etc. her pain is very poorly controlled I added Percocet 1-2 every 4 as needed in addition to Dilaudid I suspect she will need outpatient regular therapy and probably is not a good candidate for going home given she has 2 fractures-I have explained this in detail to her husband who will coordinate with social worker and plan going forward Sinus tachycardia Probably from beta-blocker withdrawal/pain-resumed bisoprolol 10 mg daily DM TY  2 A1c 5.4 CBG 130-170-monitor trends-resuming Metformin 3 times daily plus SSI Moderate stable acute stenosis Needs repeat monitoring and further echocardiogram probably in the next 6 months to a year to denote progression-unclear if good  surgical candidate coont asa 81 Gold stage II-III COPD FEV1 52% 2019 Requires outpatient follow-up with her pulmonologist Cont Anoro Ellipta, albuteral, flonase Does not use oxygen at home HTN Holding [place] for now-monitor tredns Breast cancer status post lumpectomy Bladder cancer status post resection chemo BMI 36   Subjective: Awake alert in and no focal deficit Awake Eating drinkin states she is an 8/10 pain with her hip attempted to get out of bed yesterday but it was difficult to do so when she needed max assistance She perseverates on wanting to go home No chest pain no fever no chills  Consultants: Orthopedics  Objective: Vitals:   08/24/19 2115 08/25/19 0200 08/25/19 0518 08/25/19 0749  BP: 140/67 105/65 135/68   Pulse: 95 (!) 58 (!) 106   Resp: 18 17 18    Temp: 98.4 F (36.9 C) 98.4 F (36.9 C) 97.7 F (36.5 C)   TempSrc: Oral Oral Oral   SpO2: 96% 100% 94% 95%  Weight:      Height:        Intake/Output Summary (Last 24 hours) at 08/25/2019 1140 Last data filed at 08/25/2019 [id number] Gross per 24 hour  Intake 3720 ml  Output 2550 ml  Net 1170 ml   Filed Weights   08/23/19 2052  Weight: 95.7 kg    Examination:  No change from yesterday's exam  eomi ncat no focal deficit  s1 s2 no m/r/g abd soft nt nd no rebound no guard abd soft nt nd  No le edema Neuro intact smile symm  Scheduled Meds: . bisoprolol  10 mg Oral Daily  . Chlorhexidine Gluconate Cloth  6 each Topical Daily  . docusate sodium  100 mg Oral BID  . enoxaparin (LOVENOX) injection  40 mg Subcutaneous Q24H  .  insulin aspart  0-9 Units Subcutaneous Q4H  . loratadine  10 mg Oral Daily  . magic mouthwash  5 mL Oral TID  . metFORMIN  500 mg Oral TID WC  . simvastatin  20 mg Oral QHS  . umeclidinium-vilanterol  1 puff Inhalation Daily   Continuous Infusions:    LOS: 2 days   Time spent: [place], MD [facility]  08/25/2019, 11:40 AM

## 2019-08-25 NOTE — Progress Notes (Signed)
Patient states the Percocet relieves her pain but makes her itch. Dr Verlon Au paged to ask for anti itch medication. Donne Hazel, RN

## 2019-08-25 NOTE — Evaluation (Signed)
Physical Therapy Evaluation Patient Details Name: Shannon Schultz MRN: [id number] DOB: 30-May-1946 Today's Date: 08/25/2019   History of Present Illness  Shannon Schultz is a 74 y.o. female who presented with an intertrochanteric femur fracture and right anterior shoulder dislocation / nondisplaced greater tuberosity fracture. She underwent closed reduction of the shoulder, and a sling was placed. The patient was admitted to the hospitalist service and underwent perioperative risk stratification and medical optimization.  Clinical Impression  Pt admitted as above and presenting with functional mobility limitations 2* non-use of R UE, limited use post op of R LE, post op pain, balance deficits and limited endurance.  Pt very motivated but currently requiring significant assist of 2 for safe performance of all mobility tasks.  Pt would benefit from follow up inpt rehab to maximize IND and safety prior to transition home.    Follow Up Recommendations CIR    Equipment Recommendations  None recommended by PT    Recommendations for Other Services       Precautions / Restrictions Precautions Precautions: Fall Restrictions Weight Bearing Restrictions: Yes RUE Weight Bearing: Non weight bearing RLE Weight Bearing: Weight bearing as tolerated Other Position/Activity Restrictions: WBAT R LE      Mobility  Bed Mobility Overal bed mobility: Needs Assistance Bed Mobility: Supine to Sit;Sit to Supine     Supine to sit: Mod assist;Max assist;+2 for physical assistance;+2 for safety/equipment Sit to supine: Mod assist;Max assist;+2 for physical assistance;+2 for safety/equipment   General bed mobility comments: Increased time with use of bedrail and cues for sequence and use of L LE to self assist.  Physical assist required to manage R LE, control trunk and to complete transition using pad  Transfers Overall transfer level: Needs assistance   Transfers: Sit to/from Stand Sit to Stand: Mod  assist;+2 physical assistance;+2 safety/equipment;From elevated surface         General transfer comment: elevated bed, physical assist to bring wt up and fwd and to balance in standing with L UE on RW  Ambulation/Gait             General Gait Details: Pt stood x 2 min only, unable to advance either LE  Stairs            Wheelchair Mobility    Modified Rankin (Stroke Patients Only)       Balance Overall balance assessment: Needs assistance Sitting-balance support: No upper extremity supported;Feet supported Sitting balance-Leahy Scale: Fair     Standing balance support: Single extremity supported Standing balance-Leahy Scale: Poor                               Pertinent Vitals/Pain Pain Assessment: 0-10 Pain Score: 7  Pain Location: R hip/shoulder Pain Descriptors / Indicators: Aching;Sore;Spasm Pain Intervention(s): Limited activity within patient's tolerance;Monitored during session;Premedicated before session;Patient requesting pain meds-RN notified;RN gave pain meds during session;Repositioned    Home Living Family/patient expects to be discharged to:: Private residence Living Arrangements: Spouse/significant other Available Help at Discharge: Available PRN/intermittently Type of Home: House Home Access: Stairs to enter   [employer] of Steps: 1 Home Layout: One level;Other (Comment) Home Equipment: Walker - 4 wheels;Wheelchair - manual;Cane - single point      Prior Function Level of Independence: Independent               Hand Dominance        Extremity/Trunk Assessment   Upper Extremity Assessment  Upper Extremity Assessment: RUE deficits/detail RUE Deficits / Details: immobilized in sling    Lower Extremity Assessment Lower Extremity Assessment: RLE deficits/detail       Communication   Communication: No difficulties  Cognition Arousal/Alertness: Awake/alert Behavior During Therapy: WFL for tasks  assessed/performed Overall Cognitive Status: Within Functional Limits for tasks assessed                                        General Comments      Exercises General Exercises - Lower Extremity Ankle Circles/Pumps: AROM;Both;15 reps;Supine   Assessment/Plan    PT Assessment Patient needs continued PT services  PT Problem List Decreased strength;Decreased range of motion;Decreased activity tolerance;Decreased balance;Decreased mobility;Decreased knowledge of use of DME;Obesity;Pain       PT Treatment Interventions DME instruction;Gait training;Stair training;Therapeutic exercise;Therapeutic activities;Functional mobility training;Patient/family education    PT Goals (Current goals can be found in the Care Plan section)  Acute Rehab PT Goals Patient Stated Goal: See my grand babies PT Goal Formulation: With patient Time For Goal Achievement: 09/08/19 Potential to Achieve Goals: Fair    Frequency Min 5X/week   Barriers to discharge Decreased caregiver support Family assist prn - spouse works    Co-evaluation PT/OT/SLP Co-Evaluation/Treatment: Yes Reason for Co-Treatment: For Doctor, hospital;Complexity of the patient's impairments (multi-system involvement) PT goals addressed during session: Mobility/safety with mobility OT goals addressed during session: ADL's and self-care       AM-PAC PT "6 Clicks" Mobility  Outcome Measure Help needed turning from your back to your side while in a flat bed without using bedrails?: A Lot Help needed moving from lying on your back to sitting on the side of a flat bed without using bedrails?: A Lot Help needed moving to and from a bed to a chair (including a wheelchair)?: A Lot Help needed standing up from a chair using your arms (e.g., wheelchair or bedside chair)?: A Lot Help needed to walk in hospital room?: Total Help needed climbing 3-5 steps with a railing? : Total 6 Click Score: 10    End of Session  Equipment Utilized During Treatment: Gait belt Activity Tolerance: Patient limited by fatigue;Patient limited by pain;Other (comment)(HR elevated to 150s) Patient left: in bed;with call bell/phone within reach;with bed alarm set Nurse Communication: Mobility status PT Visit Diagnosis: History of falling (Z91.81);Difficulty in walking, not elsewhere classified (R26.2);Pain Pain - Right/Left: Right Pain - part of body: Hip;Shoulder    Time: 1101-1150 PT Time Calculation (min) (ACUTE ONLY): 49 min   Charges:   PT Evaluation $PT Eval Moderate Complexity: 1 Mod PT Treatments $Therapeutic Activity: 8-22 mins        Debe Coder PT Acute Rehabilitation Services Pager [phone] Office [phone]   [facility] 08/25/2019, 12:36 PM

## 2019-08-26 LAB — BASIC METABOLIC PANEL
Anion gap: 8 (ref 5–15)
BUN: 17 mg/dL (ref 8–23)
CO2: 29 mmol/L (ref 22–32)
Calcium: 8.5 mg/dL — ABNORMAL LOW (ref 8.9–10.3)
Chloride: 99 mmol/L (ref 98–111)
Creatinine, Ser: 0.51 mg/dL (ref 0.44–1.00)
GFR calc Af Amer: >60 mL/min (ref >60)
GFR calc non Af Amer: >60 mL/min (ref >60)
Glucose, Bld: 127 mg/dL — ABNORMAL HIGH (ref 70–99)
Potassium: 3.9 mmol/L (ref 3.5–5.1)
Sodium: 136 mmol/L (ref 135–145)

## 2019-08-26 LAB — CBC
HCT: 28.4 % — ABNORMAL LOW (ref 36.0–46.0)
Hemoglobin: 9.1 g/dL — ABNORMAL LOW (ref 12.0–15.0)
MCH: 29.4 pg (ref 26.0–34.0)
MCHC: 32 g/dL (ref 30.0–36.0)
MCV: 91.6 fL (ref 80.0–100.0)
Platelets: 117 10*3/uL — ABNORMAL LOW (ref 150–400)
RBC: 3.1 MIL/uL — ABNORMAL LOW (ref 3.87–5.11)
RDW: 15.1 % (ref 11.5–15.5)
WBC: 5.7 10*3/uL (ref 4.0–10.5)
nRBC: 0 % (ref 0.0–0.2)

## 2019-08-26 LAB — GLUCOSE, CAPILLARY
Glucose-Capillary: 114 mg/dL — ABNORMAL HIGH (ref 70–99)
Glucose-Capillary: 125 mg/dL — ABNORMAL HIGH (ref 70–99)
Glucose-Capillary: 132 mg/dL — ABNORMAL HIGH (ref 70–99)
Glucose-Capillary: 134 mg/dL — ABNORMAL HIGH (ref 70–99)
Glucose-Capillary: 154 mg/dL — ABNORMAL HIGH (ref 70–99)
Glucose-Capillary: 165 mg/dL — ABNORMAL HIGH (ref 70–99)

## 2019-08-26 MED ORDER — ENOXAPARIN SODIUM 40 MG/0.4ML ~~LOC~~ SOLN: 40.0000 mg | SUBCUTANEOUS | 0 refills | Status: DC

## 2019-08-26 MED ORDER — TRAMADOL HCL 50 MG PO TABS
100.0000 mg | ORAL_TABLET | Freq: Four times a day (QID) | ORAL | Status: DC
  Administered 2019-08-26: 100 mg via ORAL
  Filled 2019-08-26: qty 2

## 2019-08-26 MED ORDER — HYDROXYZINE HCL 10 MG PO TABS
10.0000 mg | ORAL_TABLET | Freq: Three times a day (TID) | ORAL | Status: DC | PRN
  Administered 2019-08-26 – 2019-08-29 (×7): 10 mg via ORAL
  Filled 2019-08-26 (×9): qty 1

## 2019-08-26 MED ORDER — OXYCODONE-ACETAMINOPHEN 5-325 MG PO TABS: 1.0000 | ORAL_TABLET | ORAL | 0 refills | Status: DC | PRN

## 2019-08-26 NOTE — Progress Notes (Signed)
    Subjective:  Patient reports pain as moderate.  Denies N/V/CP/SOB. No c/o. Requests PO med change to oxy. Poor mobility with PT yesterday  Objective:   VITALS:   Vitals:   08/25/19 2213 08/26/19 0629 08/26/19 0729 08/26/19 1308  BP: (!) 125/56 114/66  (!) 125/50  Pulse: 89 77  91  Resp: 17 18    Temp: 98.1 F (36.7 C) 98.1 F (36.7 C)  98.2 F (36.8 C)  TempSrc: Oral Oral  Oral  SpO2: 98% 97% 96% 96%  Weight:      Height:        NAD ABD soft Sensation intact distally Intact pulses distally Dorsiflexion/Plantar flexion intact Incision: dressing C/D/I Compartment soft   Lab Results  Component Value Date   WBC 5.7 08/26/2019   HGB 9.1 (L) 08/26/2019   HCT 28.4 (L) 08/26/2019   MCV 91.6 08/26/2019   PLT 117 (L) 08/26/2019   BMET    Component Value Date/Time   NA 136 08/26/2019 0400   NA 142 04/04/2019 0830   K 3.9 08/26/2019 0400   CL 99 08/26/2019 0400   CO2 29 08/26/2019 0400   GLUCOSE 127 (H) 08/26/2019 0400   BUN 17 08/26/2019 0400   BUN 20 04/04/2019 0830   CREATININE 0.51 08/26/2019 0400   CREATININE 0.75 03/05/2017 0736   CALCIUM 8.5 (L) 08/26/2019 0400   GFRNONAA >60 08/26/2019 0400   GFRAA >60 08/26/2019 0400     Assessment/Plan: 2 Days Post-Op   Active Problems:   Pure hypercholesterolemia   Essential hypertension, benign   Aortic stenosis   Type 2 diabetes mellitus (HCC)   Morbid obesity (HCC)   Cirrhosis of liver without ascites (HCC)   COPD, group B, by GOLD 2017 classification ([place])   Closed right hip fracture (HCC)   Hip fracture ([place])   WBAT with walker DVT ppx: Lovenox for 30 days, SCDs, TEDS PO pain control PT/OT Dispo: D/C planning  Hilton Cork Lanier [place]on 08/26/2019, 1:20 PM   Rod Can, MD [phone] [facility]  [facility] [place] [address], [address], [place], [place] [address] Phone: [phone] [email or web address] Facebook  [employer]

## 2019-08-26 NOTE — Progress Notes (Signed)
[facility] Floor coverage has been paged about the patient's itching.  New orders from [facility] Floor Coverage: IV Benadryl 25 mg Waiting for Pharmacy to verify med and will be giving this.

## 2019-08-26 NOTE — Progress Notes (Signed)
Physical Therapy Treatment Patient Details Name: Shannon Schultz MRN: [id number] DOB: [birth year] Today's Date: 08/26/2019    History of Present Illness Shannon Schultz is a 74 y.o. female who presented with an intertrochanteric femur fracture and right anterior shoulder dislocation / nondisplaced greater tuberosity fracture. She underwent closed reduction of the shoulder, and a sling was placed. The patient was admitted to the hospitalist service and underwent perioperative risk stratification and medical optimization.    PT Comments    Pt continues cooperative but ltd this session by pain and fatigue following 2 hours up in chair.   Follow Up Recommendations  CIR     Equipment Recommendations  None recommended by PT    Recommendations for Other Services       Precautions / Restrictions Precautions Precautions: Fall Restrictions Weight Bearing Restrictions: Yes RUE Weight Bearing: Non weight bearing RLE Weight Bearing: Weight bearing as tolerated Other Position/Activity Restrictions: WBAT R LE    Mobility  Bed Mobility Overal bed mobility: Needs Assistance Bed Mobility: Sit to Supine     Supine to sit: Mod assist;Max assist;+2 for physical assistance;+2 for safety/equipment Sit to supine: Mod assist;Max assist;+2 for physical assistance;+2 for safety/equipment   General bed mobility comments: Increased assist 2* pt fatigue after 2 hrs up in chair  Transfers Overall transfer level: Needs assistance Equipment used: Rolling walker (2 wheeled) Transfers: Sit to/from Stand Sit to Stand: +2 physical assistance;+2 safety/equipment;From elevated surface;Mod assist;Max assist Stand pivot transfers: Mod assist;+2 physical assistance;+2 safety/equipment;From elevated surface       General transfer comment: Pt assisted to stand from recliner and balance in standing while chair pulled from behind and bed brought up  Ambulation/Gait Ambulation/Gait assistance: Mod assist;+2  physical assistance;+2 safety/equipment Gait Distance (Feet): 1 Feet Assistive device: Rolling walker (2 wheeled) Gait Pattern/deviations: Step-to pattern;Decreased step length - right;Decreased step length - left;Shuffle;Trunk flexed Gait velocity: decr   General Gait Details: NT 2* pt pain/fatigue   Stairs             Wheelchair Mobility    Modified Rankin (Stroke Patients Only)       Balance Overall balance assessment: Needs assistance Sitting-balance support: No upper extremity supported;Feet supported Sitting balance-Leahy Scale: Fair     Standing balance support: Single extremity supported Standing balance-Leahy Scale: Poor                              Cognition Arousal/Alertness: Awake/alert Behavior During Therapy: WFL for tasks assessed/performed Overall Cognitive Status: Within Functional Limits for tasks assessed                                        Exercises General Exercises - Lower Extremity Ankle Circles/Pumps: AROM;Both;15 reps;Supine Heel Slides: AAROM;Left;15 reps;Supine Hip ABduction/ADduction: AAROM;Right;10 reps;Supine    General Comments        Pertinent Vitals/Pain Pain Assessment: 0-10 Pain Score: 8  Pain Location: R hip/shoulder Pain Descriptors / Indicators: Aching;Sore;Spasm Pain Intervention(s): Limited activity within patient's tolerance;Monitored during session;Premedicated before session(pt refuses ice)    Home Living                      Prior Function            PT Goals (current goals can now be found in the care plan section) Acute Rehab PT Goals  Patient Stated Goal: See my grand babies PT Goal Formulation: With patient Time For Goal Achievement: 09/08/19 Potential to Achieve Goals: Fair Progress towards PT goals: Progressing toward goals    Frequency    Min 5X/week      PT Plan Current plan remains appropriate    Co-evaluation              AM-PAC PT "6  Clicks" Mobility   Outcome Measure  Help needed turning from your back to your side while in a flat bed without using bedrails?: A Lot Help needed moving from lying on your back to sitting on the side of a flat bed without using bedrails?: A Lot Help needed moving to and from a bed to a chair (including a wheelchair)?: A Lot Help needed standing up from a chair using your arms (e.g., wheelchair or bedside chair)?: A Lot Help needed to walk in hospital room?: Total Help needed climbing 3-5 steps with a railing? : Total 6 Click Score: 10    End of Session Equipment Utilized During Treatment: Gait belt Activity Tolerance: Patient limited by fatigue;Patient limited by pain Patient left: in chair;with call bell/phone within reach;with chair alarm set Nurse Communication: Mobility status PT Visit Diagnosis: History of falling (Z91.81);Difficulty in walking, not elsewhere classified (R26.2);Pain Pain - Right/Left: Right Pain - part of body: Hip;Shoulder     Time: 1540-1600 PT Time Calculation (min) (ACUTE ONLY): 20 min  Charges:  $Therapeutic Exercise: 8-22 mins $Therapeutic Activity: 8-22 mins                     Debe Coder PT Acute Rehabilitation Services Pager [phone] Office [phone]    Jasie Meleski 08/26/2019, 4:50 PM

## 2019-08-26 NOTE — Progress Notes (Signed)
Hospitalist progress note   Patient from home, Patient going home likely, Dispo dependent on physical therapy and work-up  Shannon Schultz [id number] DOB: 1946-03-15 DOA: 08/23/2019  PCP: [name], MD   Narrative:  48 white femaleCOPD with chronic hypoxia does not use chronic oxygen-last PFT 09/2017 FEV1 to 2%, HTN, HLD, aortic stenosis with moderate/severe stenosis echo 05/2019, TCP, DM TY 2, BMI 36, breast cancer status post lumpectomy 2019-bladder cancer status post resection intravesical chemo She had Covid in November She received the [place] vaccine 08/20/2019  Sustained accidental fall found to have closed right hip fracture-baseline meds 3-5   Data Reviewed:  BUN/creatinine 22/0.5-->15/0.5-->17/0.5 Hemoglobin 12.6-->10.2-->9.1 platelet 127-->117   Assessment & Plan:  Coronavirus positive result Patient is going to remain positive-this should not have a bearing on further clinical management-we will continue to test + repeat monitoring Closed hip fracture Right shoulder dislocation status post closed reduction with likely greater tuberosity fracture and shoulder immobilizer in place Status post surgery today 2/24-did not did well with Toradol therefore resume Percocet despite itching + IV n Dilaudid Await CIR input regarding candidacy for inpatient rehab Sinus tachycardia Probably from beta-blocker withdrawal/pain-resumed bisoprolol 10 mg daily DM TY  2 A1c 5.4 CBG 1 1 4-1 25 eating 100% meals-back on Metformin 3 times daily plus SSI Moderate stable acute stenosis Needs repeat monitoring and further echocardiogram probably in the next 6 months to a year to denote progression-unclear if good surgical candidate coont asa 81 Gold stage II-III COPD FEV1 52% 2019 Requires outpatient follow-up with her pulmonologist Cont Anoro Ellipta, albuteral, flonase Does not use oxygen at home HTN Bisoprolol resume holding [place] Breast cancer status post lumpectomy Bladder cancer status post  resection chemo BMI 36   Subjective: Moderate to severe pain and tramadol did not help therefore orthopedics started back oxycodone No fever no chills No chest pain Working with therapy when I evaluated her Consultants: Orthopedics  Objective: Vitals:   08/25/19 2213 08/26/19 0629 08/26/19 0729 08/26/19 1308  BP: (!) 125/56 114/66  (!) 125/50  Pulse: 89 77  91  Resp: 17 18    Temp: 98.1 F (36.7 C) 98.1 F (36.7 C)  98.2 F (36.8 C)  TempSrc: Oral Oral  Oral  SpO2: 98% 97% 96% 96%  Weight:      Height:        Intake/Output Summary (Last 24 hours) at 08/26/2019 1331 Last data filed at 08/26/2019 [id number] Gross per 24 hour  Intake 540 ml  Output 50 ml  Net 490 ml   Filed Weights   08/23/19 2052  Weight: 95.7 kg    Examination:  EOMI NCAT no focal deficit Chest clear no added sound no rales no rhonchi S1-S2 no murmur Abdomen soft nontender no rebound  Scheduled Meds: . bisoprolol  10 mg Oral Daily  . Chlorhexidine Gluconate Cloth  6 each Topical Daily  . docusate sodium  100 mg Oral BID  . enoxaparin (LOVENOX) injection  40 mg Subcutaneous Q24H  . insulin aspart  0-9 Units Subcutaneous Q4H  . loratadine  10 mg Oral Daily  . magic mouthwash  5 mL Oral TID  . metFORMIN  500 mg Oral TID WC  . simvastatin  20 mg Oral QHS  . umeclidinium-vilanterol  1 puff Inhalation Daily   Continuous Infusions:    LOS: 3 days   Time spent: [place], MD [facility]  08/26/2019, 1:31 PM

## 2019-08-26 NOTE — Progress Notes (Signed)
Physical Therapy Treatment Patient Details Name: Shannon Schultz MRN: [id number] DOB: 1945/07/24 Today's Date: 08/26/2019    History of Present Illness Shannon Schultz is a 74 y.o. female who presented with an intertrochanteric femur fracture and right anterior shoulder dislocation / nondisplaced greater tuberosity fracture. She underwent closed reduction of the shoulder, and a sling was placed. The patient was admitted to the hospitalist service and underwent perioperative risk stratification and medical optimization.    PT Comments    Pt very motivated to progress.  This session, pt performed LE therex with assist, stood x min with increased WB on R LE, and performed stand pvt to sit up in recliner.   Follow Up Recommendations  CIR     Equipment Recommendations  None recommended by PT    Recommendations for Other Services       Precautions / Restrictions Precautions Precautions: Fall Restrictions Weight Bearing Restrictions: Yes RUE Weight Bearing: Non weight bearing RLE Weight Bearing: Weight bearing as tolerated Other Position/Activity Restrictions: WBAT R LE    Mobility  Bed Mobility Overal bed mobility: Needs Assistance Bed Mobility: Supine to Sit     Supine to sit: Mod assist;Max assist;+2 for physical assistance;+2 for safety/equipment     General bed mobility comments: Increased time with use of bedrail and cues for sequence and use of L LE to self assist.  Physical assist required to manage R LE, control trunk and to complete transition using pad  Transfers Overall transfer level: Needs assistance Equipment used: Rolling walker (2 wheeled) Transfers: Sit to/from [facility] Sit to Stand: Mod assist;+2 physical assistance;+2 safety/equipment;From elevated surface Stand pivot transfers: Mod assist;+2 physical assistance;+2 safety/equipment;From elevated surface       General transfer comment: elevated bed, physical assist to bring wt up and fwd  and to balance in standing with L UE on RW;  Stand pvt bed to chair with assist and R hand on RW  Ambulation/Gait Ambulation/Gait assistance: Mod assist;+2 physical assistance;+2 safety/equipment Gait Distance (Feet): 1 Feet Assistive device: Rolling walker (2 wheeled) Gait Pattern/deviations: Step-to pattern;Decreased step length - right;Decreased step length - left;Shuffle;Trunk flexed     General Gait Details: Pt stood x 2 min and performed stand pvt to recliner using RW in R hand.  Pt managed 1 small step with L LE   Stairs             Wheelchair Mobility    Modified Rankin (Stroke Patients Only)       Balance Overall balance assessment: Needs assistance Sitting-balance support: No upper extremity supported;Feet supported Sitting balance-Leahy Scale: Fair     Standing balance support: Single extremity supported Standing balance-Leahy Scale: Poor                              Cognition Arousal/Alertness: Awake/alert Behavior During Therapy: WFL for tasks assessed/performed Overall Cognitive Status: Within Functional Limits for tasks assessed                                        Exercises General Exercises - Lower Extremity Ankle Circles/Pumps: AROM;Both;15 reps;Supine Heel Slides: AAROM;Left;15 reps;Supine Hip ABduction/ADduction: AAROM;Right;10 reps;Supine    General Comments        Pertinent Vitals/Pain Pain Assessment: 0-10 Pain Score: 8  Pain Location: R hip/shoulder Pain Descriptors / Indicators: Aching;Sore;Spasm Pain Intervention(s): Limited activity within  patient's tolerance;Monitored during session;Premedicated before session;Repositioned    Home Living                      Prior Function            PT Goals (current goals can now be found in the care plan section) Acute Rehab PT Goals PT Goal Formulation: With patient Time For Goal Achievement: 09/08/19 Potential to Achieve Goals: Fair Progress  towards PT goals: Progressing toward goals    Frequency    Min 5X/week      PT Plan Current plan remains appropriate    Co-evaluation              AM-PAC PT "6 Clicks" Mobility   Outcome Measure  Help needed turning from your back to your side while in a flat bed without using bedrails?: A Lot Help needed moving from lying on your back to sitting on the side of a flat bed without using bedrails?: A Lot Help needed moving to and from a bed to a chair (including a wheelchair)?: A Lot Help needed standing up from a chair using your arms (e.g., wheelchair or bedside chair)?: A Lot Help needed to walk in hospital room?: Total Help needed climbing 3-5 steps with a railing? : Total 6 Click Score: 10    End of Session Equipment Utilized During Treatment: Gait belt Activity Tolerance: Patient tolerated treatment well;Patient limited by fatigue;Patient limited by pain Patient left: in chair;with call bell/phone within reach;with chair alarm set Nurse Communication: Mobility status PT Visit Diagnosis: History of falling (Z91.81);Difficulty in walking, not elsewhere classified (R26.2);Pain Pain - Right/Left: Right Pain - part of body: Hip;Shoulder     Time: [id number] PT Time Calculation (min) (ACUTE ONLY): 38 min  Charges:  $Therapeutic Exercise: 8-22 mins $Therapeutic Activity: 23-37 mins                     Debe Coder PT Acute Rehabilitation Services Pager [phone] Office [phone]    Harolyn Cocker 08/26/2019, 3:11 PM

## 2019-08-26 NOTE — Progress Notes (Signed)
Patient is POD #2 with foley still in. Did not see an order to remove and was not told in report of any indication why the catheter is still in post op. Will relay this information to day RN to address with rounding MD.

## 2019-08-26 NOTE — Progress Notes (Signed)
Inpatient Rehabilitation-Admissions Coordinator   Spoke to pt via phone for rehab assessment. Discussed details of the program, expectations, expected length of stay and anticipated support at DC. Per pt, her husband and two sisters will be able to assist at DC. Will need to call and confirm DC plan prior to admit. Feel that pt is a good candidate for CIR at this time given her injuries, functional status, and support at DC. Pt is very interested in CIR and would like to pursue. We did discuss that we will begin insurance authorization process for possible admit on Monday. Pt very appreciative of information.   Will follow up Monday.   Raechel Ache, OTR/L  Rehab Admissions Coordinator  [phone] 08/26/2019 6:00 PM

## 2019-08-26 NOTE — Progress Notes (Signed)
Inpatient Rehabilitation Admissions Coordinator  Notified by Dr. Hulen Skains that patient no longer on COVID precautions for she was initially positive in November and should not have been retested. I will place order per protocol for rehab consult per Dr. Hulen Skains.  Danne Baxter, RN, MSN Rehab Admissions Coordinator [phone] 08/26/2019 8:06 AM '

## 2019-08-26 NOTE — Care Management Important Message (Signed)
Important Message  Patient Details IM Letter given to [place] Case Manager to present to the Patient Name: Shannon Schultz MRN: [id number] Date of Birth: 1945/07/22   Medicare Important Message Given:  Yes     Kerin Salen 08/26/2019, 11:10 AM

## 2019-08-27 LAB — CBC
HCT: 25.2 % — ABNORMAL LOW (ref 36.0–46.0)
Hemoglobin: 8.3 g/dL — ABNORMAL LOW (ref 12.0–15.0)
MCH: 30 pg (ref 26.0–34.0)
MCHC: 32.9 g/dL (ref 30.0–36.0)
MCV: 91 fL (ref 80.0–100.0)
Platelets: 124 10*3/uL — ABNORMAL LOW (ref 150–400)
RBC: 2.77 MIL/uL — ABNORMAL LOW (ref 3.87–5.11)
RDW: 15.4 % (ref 11.5–15.5)
WBC: 5.5 10*3/uL (ref 4.0–10.5)
nRBC: 0 % (ref 0.0–0.2)

## 2019-08-27 LAB — GLUCOSE, CAPILLARY
Glucose-Capillary: 103 mg/dL — ABNORMAL HIGH (ref 70–99)
Glucose-Capillary: 112 mg/dL — ABNORMAL HIGH (ref 70–99)
Glucose-Capillary: 128 mg/dL — ABNORMAL HIGH (ref 70–99)
Glucose-Capillary: 130 mg/dL — ABNORMAL HIGH (ref 70–99)
Glucose-Capillary: 146 mg/dL — ABNORMAL HIGH (ref 70–99)
Glucose-Capillary: 151 mg/dL — ABNORMAL HIGH (ref 70–99)

## 2019-08-27 MED ORDER — INSULIN ASPART 100 UNIT/ML ~~LOC~~ SOLN
0.0000 [IU] | Freq: Three times a day (TID) | SUBCUTANEOUS | Status: DC
  Administered 2019-08-28 – 2019-08-29 (×4): 1 [IU] via SUBCUTANEOUS
  Administered 2019-08-30 (×2): 2 [IU] via SUBCUTANEOUS
  Administered 2019-08-30: 1 [IU] via SUBCUTANEOUS
  Administered 2019-08-31: 2 [IU] via SUBCUTANEOUS

## 2019-08-27 MED ORDER — SENNOSIDES-DOCUSATE SODIUM 8.6-50 MG PO TABS
1.0000 | ORAL_TABLET | Freq: Every day | ORAL | Status: DC
  Administered 2019-08-27 – 2019-08-30 (×4): 1 via ORAL
  Filled 2019-08-27 (×4): qty 1

## 2019-08-27 NOTE — Progress Notes (Signed)
Physical Therapy Treatment Patient Details Name: Shannon Schultz MRN: [id number] DOB: [birth year] Today's Date: 08/27/2019    History of Present Illness Shannon Schultz is a 74 y.o. female who presented with an intertrochanteric femur fracture and right anterior shoulder dislocation / nondisplaced greater tuberosity fracture. She underwent closed reduction of the shoulder, and a sling was placed. The patient was admitted to the hospitalist service and underwent perioperative risk stratification and medical optimization.    PT Comments    Pt continues motivated and progressing slowly but steadily with mobility and therex.   Follow Up Recommendations  CIR     Equipment Recommendations  None recommended by PT    Recommendations for Other Services       Precautions / Restrictions Precautions Precautions: Fall Restrictions Weight Bearing Restrictions: Yes RUE Weight Bearing: Non weight bearing RLE Weight Bearing: Weight bearing as tolerated Other Position/Activity Restrictions: WBAT R LE    Mobility  Bed Mobility Overal bed mobility: Needs Assistance Bed Mobility: Supine to Sit     Supine to sit: Mod assist;+2 for physical assistance;+2 for safety/equipment     General bed mobility comments: Increased time with cues for sequence and use of L LE to self assist  Transfers Overall transfer level: Needs assistance Equipment used: Rolling walker (2 wheeled) Transfers: Sit to/from Stand Sit to Stand: +2 physical assistance;+2 safety/equipment;From elevated surface;Mod assist;Max assist Stand pivot transfers: Mod assist;+2 physical assistance;+2 safety/equipment;From elevated surface       General transfer comment: Stand pvt with RW bed to recliner - pt utilizing RW on L and requiring assist to step R LE around  Ambulation/Gait                 Stairs             Wheelchair Mobility    Modified Rankin (Stroke Patients Only)       Balance Overall balance  assessment: Needs assistance Sitting-balance support: No upper extremity supported;Feet supported Sitting balance-Leahy Scale: Fair     Standing balance support: Single extremity supported Standing balance-Leahy Scale: Poor                              Cognition Arousal/Alertness: Awake/alert Behavior During Therapy: WFL for tasks assessed/performed Overall Cognitive Status: Within Functional Limits for tasks assessed                                        Exercises General Exercises - Lower Extremity Ankle Circles/Pumps: AROM;Both;15 reps;Supine Quad Sets: AROM;Both;10 reps;Supine Heel Slides: AAROM;Left;15 reps;Supine Hip ABduction/ADduction: AAROM;Right;10 reps;Supine    General Comments        Pertinent Vitals/Pain Pain Assessment: 0-10 Pain Score: 4  Pain Location: R hip/shoulder Pain Descriptors / Indicators: Aching;Sore;Spasm Pain Intervention(s): Limited activity within patient's tolerance;Premedicated before session;Monitored during session    Home Living                      Prior Function            PT Goals (current goals can now be found in the care plan section) Acute Rehab PT Goals Patient Stated Goal: See my grand babies PT Goal Formulation: With patient Time For Goal Achievement: 09/08/19 Potential to Achieve Goals: Fair Progress towards PT goals: Progressing toward goals    Frequency    Min  5X/week      PT Plan Current plan remains appropriate    Co-evaluation              AM-PAC PT "6 Clicks" Mobility   Outcome Measure  Help needed turning from your back to your side while in a flat bed without using bedrails?: A Lot Help needed moving from lying on your back to sitting on the side of a flat bed without using bedrails?: A Lot Help needed moving to and from a bed to a chair (including a wheelchair)?: A Lot Help needed standing up from a chair using your arms (e.g., wheelchair or bedside  chair)?: A Lot Help needed to walk in hospital room?: Total Help needed climbing 3-5 steps with a railing? : Total 6 Click Score: 10    End of Session Equipment Utilized During Treatment: Gait belt Activity Tolerance: Patient limited by fatigue Patient left: in chair;with call bell/phone within reach;with chair alarm set Nurse Communication: Mobility status PT Visit Diagnosis: History of falling (Z91.81);Difficulty in walking, not elsewhere classified (R26.2);Pain Pain - Right/Left: Right Pain - part of body: Hip;Shoulder     Time: [id number] PT Time Calculation (min) (ACUTE ONLY): 31 min  Charges:  $Therapeutic Exercise: 8-22 mins $Therapeutic Activity: 8-22 mins                     Debe Coder PT Acute Rehabilitation Services Pager [phone] Office [phone]    Shannon Schultz 08/27/2019, 1:27 PM

## 2019-08-27 NOTE — Progress Notes (Signed)
    Subjective:  Patient reports pain as moderate.  Denies N/V/CP/SOB. No c/o. Requests PO med change to oxy. Poor mobility with PT yesterday some difficulty due to nonweightbearing status on the right upper extremity.  Objective:   VITALS:   Vitals:   08/26/19 1956 08/27/19 0402 08/27/19 0747 08/27/19 0749  BP: (!) 111/55 (!) 124/48    Pulse: 91 84    Resp: 16 16    Temp: 98.1 F (36.7 C) 98.1 F (36.7 C)    TempSrc: Oral Oral    SpO2: 97% 97% 94% 94%  Weight:      Height:        NAD ABD soft Sensation intact distally Intact pulses distally Dorsiflexion/Plantar flexion intact Incision: dressing C/D/I Compartment soft   Lab Results  Component Value Date   WBC 5.5 08/27/2019   HGB 8.3 (L) 08/27/2019   HCT 25.2 (L) 08/27/2019   MCV 91.0 08/27/2019   PLT 124 (L) 08/27/2019   BMET    Component Value Date/Time   NA 136 08/26/2019 0400   NA 142 04/04/2019 0830   K 3.9 08/26/2019 0400   CL 99 08/26/2019 0400   CO2 29 08/26/2019 0400   GLUCOSE 127 (H) 08/26/2019 0400   BUN 17 08/26/2019 0400   BUN 20 04/04/2019 0830   CREATININE 0.51 08/26/2019 0400   CREATININE 0.75 03/05/2017 0736   CALCIUM 8.5 (L) 08/26/2019 0400   GFRNONAA >60 08/26/2019 0400   GFRAA >60 08/26/2019 0400     Assessment/Plan: 3 Days Post-Op   Active Problems:   Pure hypercholesterolemia   Essential hypertension, benign   Aortic stenosis   Type 2 diabetes mellitus (HCC)   Morbid obesity (HCC)   Cirrhosis of liver without ascites (HCC)   COPD, group B, by GOLD 2017 classification ([place])   Closed right hip fracture (HCC)   Hip fracture (HCC)   WBAT with walker Regarding the right upper extremity she will be in her sling and nonweightbearing at this time.  We will likely advance her in 2 weeks with weightbearing as tolerated and range of motion as tolerated.  DVT ppx: Lovenox for 30 days, SCDs, TEDS PO pain control PT/OT Dispo: D/C planning  Nicholes Stairs  [phone]  [facility] is now [employer]  [facility] [address], [place], [place], [place] [address] Phone: [phone] [email or web address] Facebook  [employer]

## 2019-08-27 NOTE — Progress Notes (Signed)
Physical Therapy Treatment Patient Details Name: Shannon Schultz MRN: [id number] DOB: 08-Oct-1945 Today's Date: 08/27/2019    History of Present Illness Shannon Schultz is a 74 y.o. female who presented with an intertrochanteric femur fracture and right anterior shoulder dislocation / nondisplaced greater tuberosity fracture. She underwent closed reduction of the shoulder, and a sling was placed. The patient was admitted to the hospitalist service and underwent perioperative risk stratification and medical optimization.    PT Comments    Pt continues motivated but ltd this pm by increased fatigue/pain after several hours up in chair.   Follow Up Recommendations  CIR     Equipment Recommendations  None recommended by PT    Recommendations for Other Services       Precautions / Restrictions Precautions Precautions: Fall Restrictions Weight Bearing Restrictions: Yes RUE Weight Bearing: Non weight bearing RLE Weight Bearing: Weight bearing as tolerated Other Position/Activity Restrictions: WBAT R LE    Mobility  Bed Mobility Overal bed mobility: Needs Assistance Bed Mobility: Sit to Supine       Sit to supine: Mod assist;+2 for physical assistance;+2 for safety/equipment   General bed mobility comments: Increased time with cues for sequence and use of L LE to self assist  Transfers Overall transfer level: Needs assistance Equipment used: Rolling walker (2 wheeled) Transfers: Sit to/from Stand Sit to Stand: +2 physical assistance;+2 safety/equipment;From elevated surface;Mod assist         General transfer comment: Attempted lateral scoot transfer recliner to bed but pt unable to utilize LE suffiently 2* height of chair.  Pt stood from recliner and bed pulled in behind.  Ambulation/Gait                 Stairs             Wheelchair Mobility    Modified Rankin (Stroke Patients Only)       Balance Overall balance assessment: Needs  assistance Sitting-balance support: No upper extremity supported;Feet supported Sitting balance-Leahy Scale: Fair     Standing balance support: Single extremity supported Standing balance-Leahy Scale: Poor                              Cognition Arousal/Alertness: Awake/alert Behavior During Therapy: WFL for tasks assessed/performed Overall Cognitive Status: Within Functional Limits for tasks assessed                                        Exercises      General Comments        Pertinent Vitals/Pain Pain Assessment: 0-10 Pain Score: 8  Pain Location: R hip/shoulder Pain Descriptors / Indicators: Aching;Sore;Spasm Pain Intervention(s): Limited activity within patient's tolerance;Monitored during session;Premedicated before session    Home Living                      Prior Function            PT Goals (current goals can now be found in the care plan section) Acute Rehab PT Goals Patient Stated Goal: See my grand babies PT Goal Formulation: With patient Time For Goal Achievement: 09/08/19 Potential to Achieve Goals: Fair Progress towards PT goals: Progressing toward goals    Frequency    Min 5X/week      PT Plan Current plan remains appropriate    Co-evaluation  AM-PAC PT "6 Clicks" Mobility   Outcome Measure  Help needed turning from your back to your side while in a flat bed without using bedrails?: A Lot Help needed moving from lying on your back to sitting on the side of a flat bed without using bedrails?: A Lot Help needed moving to and from a bed to a chair (including a wheelchair)?: A Lot Help needed standing up from a chair using your arms (e.g., wheelchair or bedside chair)?: A Lot Help needed to walk in hospital room?: Total Help needed climbing 3-5 steps with a railing? : Total 6 Click Score: 10    End of Session Equipment Utilized During Treatment: Gait belt Activity Tolerance: Patient  limited by fatigue;Patient limited by pain Patient left: in bed;with call bell/phone within reach;with bed alarm set Nurse Communication: Mobility status PT Visit Diagnosis: History of falling (Z91.81);Difficulty in walking, not elsewhere classified (R26.2);Pain Pain - Right/Left: Right Pain - part of body: Hip;Shoulder     Time: [id number] PT Time Calculation (min) (ACUTE ONLY): 26 min  Charges:  $Therapeutic Activity: 23-37 mins                     Debe Coder PT Acute Rehabilitation Services Pager [phone] Office [phone]    Guthrie Lemme 08/27/2019, 3:26 PM

## 2019-08-27 NOTE — Progress Notes (Signed)
Hospitalist progress note   Patient from home, Patient going home likely, Dispo dependent on physical therapy and work-up  Shannon Schultz:[id number] DOB: Feb 04, 1946 DOA: 08/23/2019  PCP: [name], MD   Narrative:  2 white femaleCOPD with chronic hypoxia does not use chronic oxygen-last PFT 09/2017 FEV1 to 2%, HTN, HLD, aortic stenosis with moderate/severe stenosis echo 05/2019, TCP, DM TY 2, BMI 36, breast cancer status post lumpectomy 2019-bladder cancer status post resection intravesical chemo She had Covid in November She received the [place] vaccine 08/20/2019  Sustained accidental fall found to have closed right hip fracture-baseline meds 3-5   Data Reviewed:  Hemoglobin 12.6-->10.2-->9.1-->8.3    Assessment & Plan:  Coronavirus positive result Patient is going to remain positive-this should not have a bearing on further clinical management-we will continue to test + repeat monitoring Closed hip fracture Right shoulder dislocation status post closed reduction with likely greater tuberosity fracture and shoulder immobilizer in place Status post surgery today 2/24-did not did well with Toradol therefore resume Percocet despite itching + IV n Dilaudid Await CIR input regarding candidacy for inpatient rehab--no changes Sinus tachycardia Probably from beta-blocker withdrawal/pain-resumed bisoprolol 10 mg daily--has resolved DM TY  2 A1c 5.4 CBG 103-128 eating 100% meals-back on Metformin 3 times daily plus SSI Expected blood loss anemia Hemoglobin is dropped to 8 range from baseline 12 If drops any further we will consider 1 unit of blood-->8.3 Moderate stable Aortic stenosis Needs repeat monitoring and further echocardiogram probably in the next 6 months to a year to denote progression-unclear if good surgical candidate coont asa 81 Gold stage II-III COPD FEV1 52% 2019 Requires outpatient follow-up with her pulmonologist Cont Anoro Ellipta, albuteral, flonase Does not use oxygen  at home HTN Bisoprolol resume holding Ziac Constipation Replace Colace with MiraLAX and senna  Breast cancer status post lumpectomy Bladder cancer status post resection chemo BMI 36   called husband and updated completely on 2/27  Subjective:  Pain controlled-no stool  Consultants: Orthopedics  Objective: Vitals:   08/26/19 1956 08/27/19 0402 08/27/19 0747 08/27/19 0749  BP: (!) 111/55 (!) 124/48    Pulse: 91 84    Resp: 16 16    Temp: 98.1 F (36.7 C) 98.1 F (36.7 C)    TempSrc: Oral Oral    SpO2: 97% 97% 94% 94%  Weight:      Height:        Intake/Output Summary (Last 24 hours) at 08/27/2019 1118 Last data filed at 08/27/2019 0947 Gross per 24 hour  Intake 1260 ml  Output 2030 ml  Net -770 ml   Filed Weights   08/23/19 2052  Weight: 95.7 kg    Examination:  Awake coherent in nad no fcoal deficit cta b no adde dosund s1 s2 + M hsm  abd soft nt nd no rebound no guard Neuro intact  Scheduled Meds: . bisoprolol  10 mg Oral Daily  . Chlorhexidine Gluconate Cloth  6 each Topical Daily  . enoxaparin (LOVENOX) injection  40 mg Subcutaneous Q24H  . insulin aspart  0-9 Units Subcutaneous Q4H  . loratadine  10 mg Oral Daily  . magic mouthwash  5 mL Oral TID  . metFORMIN  500 mg Oral TID WC  . senna-docusate  1 tablet Oral QHS  . simvastatin  20 mg Oral QHS  . umeclidinium-vilanterol  1 puff Inhalation Daily   Continuous Infusions:    LOS: 4 days   Time spent: [place], MD [facility]  08/27/2019,  11:18 AM

## 2019-08-28 LAB — CBC WITH DIFFERENTIAL/PLATELET
Abs Immature Granulocytes: 0.05 10*3/uL (ref 0.00–0.07)
Basophils Absolute: 0 10*3/uL (ref 0.0–0.1)
Basophils Relative: 1 %
Eosinophils Absolute: 0.1 10*3/uL (ref 0.0–0.5)
Eosinophils Relative: 2 %
HCT: 24.7 % — ABNORMAL LOW (ref 36.0–46.0)
Hemoglobin: 7.8 g/dL — ABNORMAL LOW (ref 12.0–15.0)
Immature Granulocytes: 1 %
Lymphocytes Relative: 24 %
Lymphs Abs: 1.1 10*3/uL (ref 0.7–4.0)
MCH: 29.3 pg (ref 26.0–34.0)
MCHC: 31.6 g/dL (ref 30.0–36.0)
MCV: 92.9 fL (ref 80.0–100.0)
Monocytes Absolute: 0.3 10*3/uL (ref 0.1–1.0)
Monocytes Relative: 7 %
Neutro Abs: 3 10*3/uL (ref 1.7–7.7)
Neutrophils Relative %: 65 %
Platelets: 129 10*3/uL — ABNORMAL LOW (ref 150–400)
RBC: 2.66 MIL/uL — ABNORMAL LOW (ref 3.87–5.11)
RDW: 15.6 % — ABNORMAL HIGH (ref 11.5–15.5)
WBC: 4.5 10*3/uL (ref 4.0–10.5)
nRBC: 0 % (ref 0.0–0.2)

## 2019-08-28 LAB — CBC
HCT: 26.5 % — ABNORMAL LOW (ref 36.0–46.0)
Hemoglobin: 8.6 g/dL — ABNORMAL LOW (ref 12.0–15.0)
MCH: 30.1 pg (ref 26.0–34.0)
MCHC: 32.5 g/dL (ref 30.0–36.0)
MCV: 92.7 fL (ref 80.0–100.0)
Platelets: 124 10*3/uL — ABNORMAL LOW (ref 150–400)
RBC: 2.86 MIL/uL — ABNORMAL LOW (ref 3.87–5.11)
RDW: 15.5 % (ref 11.5–15.5)
WBC: 5.1 10*3/uL (ref 4.0–10.5)
nRBC: 0 % (ref 0.0–0.2)

## 2019-08-28 LAB — RENAL FUNCTION PANEL
Albumin: 2.8 g/dL — ABNORMAL LOW (ref 3.5–5.0)
Anion gap: 8 (ref 5–15)
BUN: 16 mg/dL (ref 8–23)
CO2: 27 mmol/L (ref 22–32)
Calcium: 8.4 mg/dL — ABNORMAL LOW (ref 8.9–10.3)
Chloride: 102 mmol/L (ref 98–111)
Creatinine, Ser: 0.44 mg/dL (ref 0.44–1.00)
GFR calc Af Amer: >60 mL/min (ref >60)
GFR calc non Af Amer: >60 mL/min (ref >60)
Glucose, Bld: 147 mg/dL — ABNORMAL HIGH (ref 70–99)
Phosphorus: 3.3 mg/dL (ref 2.5–4.6)
Potassium: 3.9 mmol/L (ref 3.5–5.1)
Sodium: 137 mmol/L (ref 135–145)

## 2019-08-28 LAB — PREPARE RBC (CROSSMATCH)

## 2019-08-28 LAB — GLUCOSE, CAPILLARY
Glucose-Capillary: 132 mg/dL — ABNORMAL HIGH (ref 70–99)
Glucose-Capillary: 146 mg/dL — ABNORMAL HIGH (ref 70–99)
Glucose-Capillary: 124 mg/dL — ABNORMAL HIGH (ref 70–99)
Glucose-Capillary: 138 mg/dL — ABNORMAL HIGH (ref 70–99)

## 2019-08-28 MED ORDER — SODIUM CHLORIDE 0.9% IV SOLUTION: Freq: Once | INTRAVENOUS | Status: AC

## 2019-08-28 MED ORDER — ACETAMINOPHEN 325 MG PO TABS
650.0000 mg | ORAL_TABLET | Freq: Once | ORAL | Status: AC
  Administered 2019-08-28: 10:00:00 650 mg via ORAL
  Filled 2019-08-28: qty 2

## 2019-08-28 MED ORDER — FUROSEMIDE 10 MG/ML IJ SOLN
20.0000 mg | Freq: Once | INTRAMUSCULAR | Status: AC
  Administered 2019-08-28: 20 mg via INTRAVENOUS
  Filled 2019-08-28: qty 2

## 2019-08-28 MED ORDER — DIPHENHYDRAMINE HCL 25 MG PO CAPS
25.0000 mg | ORAL_CAPSULE | Freq: Once | ORAL | Status: AC
  Administered 2019-08-28: 10:00:00 25 mg via ORAL
  Filled 2019-08-28: qty 1

## 2019-08-28 NOTE — Progress Notes (Signed)
Attempted to DC O2; patient desats to 86%. O2 reapplied. Donne Hazel, RN

## 2019-08-28 NOTE — Progress Notes (Signed)
Turned patient over to place her on the bedpan. Patient states it was "excruciatingly painful" . She states "I'd like to leave the catheter in for another day." Donne Hazel, RN

## 2019-08-28 NOTE — Progress Notes (Signed)
Hospitalist progress note   Patient from home, Patient going home likely, Dispo dependent on physical therapy and work-up  Shannon Schultz [id number] DOB: 18-Nov-1945 DOA: 08/23/2019  PCP: [name], MD   Narrative:  4 white femaleCOPD with chronic hypoxia does not use chronic oxygen-last PFT 09/2017 FEV1 to 2%, HTN, HLD, aortic stenosis with moderate/severe stenosis echo 05/2019, TCP, DM TY 2, BMI 36, breast cancer status post lumpectomy 2019-bladder cancer status post resection intravesical chemo She had Covid in November She received the [place] vaccine 08/20/2019  Sustained accidental fall found to have closed right hip fracture-baseline meds 3-5   Data Reviewed:  Hemoglobin 12.6-->10.2-->9.1-->8.3 --7.8   Assessment & Plan:  Coronavirus positive result Patient is going to remain positive-this should not have a bearing on further clinical management-we will continue to test + repeat monitoring Closed hip fracture Right shoulder dislocation status post closed reduction with likely greater tuberosity fracture and shoulder immobilizer in place Status post surgery 2/24 Percocet seems to help with pain Await CIR input regarding candidacy for inpatient rehab--no changes Sinus tachycardia Probably from beta-blocker withdrawal/pain-resumed bisoprolol 10 mg daily--has resolved DM TY  2 A1c 5.4 CBG 1 32-1 51  eating 100% meals-back on Metformin 3 times daily plus SSI Expected blood loss anemia Hemoglobin is dropped to 8 range from baseline 12 Transfused 1 unit PRBC 2/28 Will need iron on discharge Moderate stable Aortic stenosi Needs repeat monitoring and further echocardiogram probably in the next 6 months to a year to denote progression-unclear if good surgical candidate coont asa 81 Gold stage II-III COPD FEV1 52% 2019 with no oxygen requirement usually at home Requires outpatient follow-up with her pulmonologist Cont Anoro Ellipta, albuteral, flonase Monitor trends HTN Bisoprolol  resume holding Ziac Constipation Replace Colace with MiraLAX and senna  Breast cancer status post lumpectomy Bladder cancer status post resection chemo BMI 36   called husband and updated completely on 2/27  Subjective:  Just finished working with therapy Placed on oxygen overnight for some unclear reason No other issue really pain was uncontrolled this morning to 6/10 but is better with pain meds Had a stool this a.m.  Consultants: Orthopedics  Objective: Vitals:   08/27/19 2201 08/28/19 0615 08/28/19 1002 08/28/19 1027  BP: (!) 118/46 (!) 128/59 (!) 116/54 110/87  Pulse: 98 98 (!) 101 97  Resp: 18 16 20 20   Temp: 98.8 F (37.1 C) 99 F (37.2 C) 98.7 F (37.1 C) 98.6 F (37 C)  TempSrc: Oral Oral Oral Oral  SpO2: 96% 99% 98% 100%  Weight:      Height:        Intake/Output Summary (Last 24 hours) at 08/28/2019 1030 Last data filed at 08/28/2019 [id number] Gross per 24 hour  Intake 480 ml  Output 1200 ml  Net -720 ml   Filed Weights   08/23/19 2052  Weight: 95.7 kg    Examination:  Coherent pleasant lying in bed EOMI NCAT no focal deficit [id number] holosystolic murmur Chest clear no added sound Abdomen obese no tenderness slightly distended Neurologically intact right upper extremity in the sling  Scheduled Meds: . bisoprolol  10 mg Oral Daily  . Chlorhexidine Gluconate Cloth  6 each Topical Daily  . enoxaparin (LOVENOX) injection  40 mg Subcutaneous Q24H  . insulin aspart  0-9 Units Subcutaneous TID WC  . magic mouthwash  5 mL Oral TID  . metFORMIN  500 mg Oral TID WC  . senna-docusate  1 tablet Oral QHS  . simvastatin  20  mg Oral QHS  . umeclidinium-vilanterol  1 puff Inhalation Daily   Continuous Infusions:    LOS: 5 days   Time spent: [place], MD [facility]  08/28/2019, 10:30 AM

## 2019-08-28 NOTE — Progress Notes (Signed)
Physical Therapy Treatment Patient Details Name: Shannon Schultz MRN: [id number] DOB: May 28, 1946 Today's Date: 08/28/2019    History of Present Illness Shannon Schultz is a 74 y.o. female who presented with an intertrochanteric femur fracture and right anterior shoulder dislocation / nondisplaced greater tuberosity fracture. She underwent closed reduction of the shoulder, and a sling was placed. The patient was admitted to the hospitalist service and underwent perioperative risk stratification and medical optimization.    PT Comments    Pt performed therex program with assist.  OOB deferred - RN starting blood transfusion.   Follow Up Recommendations  CIR     Equipment Recommendations  None recommended by PT    Recommendations for Other Services       Precautions / Restrictions Precautions Precautions: Fall Restrictions Weight Bearing Restrictions: Yes RUE Weight Bearing: Non weight bearing RLE Weight Bearing: Weight bearing as tolerated Other Position/Activity Restrictions: WBAT R LE    Mobility  Bed Mobility                  Transfers                    Ambulation/Gait                 Stairs             Wheelchair Mobility    Modified Rankin (Stroke Patients Only)       Balance                                            Cognition Arousal/Alertness: Awake/alert Behavior During Therapy: WFL for tasks assessed/performed Overall Cognitive Status: Within Functional Limits for tasks assessed                                        Exercises General Exercises - Lower Extremity Ankle Circles/Pumps: AROM;Both;15 reps;Supine Quad Sets: AROM;Both;10 reps;Supine Gluteal Sets: AROM;10 reps;Supine Heel Slides: AAROM;Left;Supine;20 reps Hip ABduction/ADduction: AAROM;Right;Supine;15 reps    General Comments        Pertinent Vitals/Pain Pain Assessment: 0-10 Pain Score: 4  Pain Location: R  hip/shoulder Pain Descriptors / Indicators: Aching;Sore;Spasm Pain Intervention(s): Limited activity within patient's tolerance;Monitored during session;Premedicated before session;Ice applied    Home Living                      Prior Function            PT Goals (current goals can now be found in the care plan section) Acute Rehab PT Goals PT Goal Formulation: With patient Time For Goal Achievement: 09/08/19 Potential to Achieve Goals: Fair Progress towards PT goals: Progressing toward goals    Frequency    Min 5X/week      PT Plan Current plan remains appropriate    Co-evaluation              AM-PAC PT "6 Clicks" Mobility   Outcome Measure  Help needed turning from your back to your side while in a flat bed without using bedrails?: A Lot Help needed moving from lying on your back to sitting on the side of a flat bed without using bedrails?: A Lot Help needed moving to and from a bed to a chair (including a wheelchair)?:  A Lot Help needed standing up from a chair using your arms (e.g., wheelchair or bedside chair)?: A Lot Help needed to walk in hospital room?: Total Help needed climbing 3-5 steps with a railing? : Total 6 Click Score: 10    End of Session   Activity Tolerance: Patient tolerated treatment well;Patient limited by fatigue Patient left: in bed;with call bell/phone within reach;with bed alarm set Nurse Communication: Mobility status PT Visit Diagnosis: History of falling (Z91.81);Difficulty in walking, not elsewhere classified (R26.2);Pain Pain - Right/Left: Right Pain - part of body: Hip;Shoulder     Time: 1005-1027 PT Time Calculation (min) (ACUTE ONLY): 22 min  Charges:  $Therapeutic Exercise: 8-22 mins                     Debe Coder PT Acute Rehabilitation Services Pager [phone] Office [phone]    Shawana Knoch 08/28/2019, 1:06 PM

## 2019-08-28 NOTE — Progress Notes (Signed)
     Subjective: 4 Days Post-Op Procedure(s) (LRB): INTRAMEDULLARY (IM) NAIL FEMORAL (Right)   Patient reports pain as moderate.  Denies N/V/CP/SOB.  Physical therapy reports the patient continues to be motivated and progressing slowly.  I did help the patient bring her leg back to neutral position which is more comfortable for the patient. Difficulty due to nonweightbearing status on the right upper extremity.    Objective:   VITALS:   Vitals:   08/27/19 2201 08/28/19 0615  BP: (!) 118/46 (!) 128/59  Pulse: 98 98  Resp: 18 16  Temp: 98.8 F (37.1 C) 99 F (37.2 C)  SpO2: 96% 99%    Dorsiflexion/Plantar flexion intact Incision: dressing C/D/I No cellulitis present Compartment soft  LABS Recent Labs    08/26/19 0400 08/27/19 0344 08/28/19 0519  HGB 9.1* 8.3* 7.8*  HCT 28.4* 25.2* 24.7*  WBC 5.7 5.5 4.5  PLT 117* 124* 129*    Recent Labs    08/26/19 0400 08/28/19 0519  NA 136 137  K 3.9 3.9  BUN 17 16  CREATININE 0.51 0.44  GLUCOSE 127* 147*     Assessment/Plan: 4 Days Post-Op Procedure(s) (LRB): INTRAMEDULLARY (IM) NAIL FEMORAL (Right)  WBAT with walker Regarding the right upper extremity she will be in her sling and nonweightbearing at this time.  We will likely advance her in 2 weeks with weightbearing as tolerated and range of motion as tolerated.  DVT ppx: Lovenox for 30 days, SCDs, TEDS PO pain control PT/OT Dispo: D/C planning      Danae Orleans PA-C  [facility] is now [facility]  [facility] [address], [place], [place], [place] [address] Phone: [phone] [email or web address] Facebook  [employer]

## 2019-08-29 LAB — TYPE AND SCREEN
ABO/RH(D): A POS
Antibody Screen: NEGATIVE
Unit division: 0

## 2019-08-29 LAB — BPAM RBC
Blood Product Expiration Date: 202103272359
ISSUE DATE / TIME: 202102281006
Unit Type and Rh: 6200

## 2019-08-29 LAB — CBC WITH DIFFERENTIAL/PLATELET
Abs Immature Granulocytes: 0.06 10*3/uL (ref 0.00–0.07)
Basophils Absolute: 0 10*3/uL (ref 0.0–0.1)
Basophils Relative: 1 %
Eosinophils Absolute: 0.1 10*3/uL (ref 0.0–0.5)
Eosinophils Relative: 2 %
HCT: 27 % — ABNORMAL LOW (ref 36.0–46.0)
Hemoglobin: 8.6 g/dL — ABNORMAL LOW (ref 12.0–15.0)
Immature Granulocytes: 2 %
Lymphocytes Relative: 27 %
Lymphs Abs: 1.1 10*3/uL (ref 0.7–4.0)
MCH: 29.7 pg (ref 26.0–34.0)
MCHC: 31.9 g/dL (ref 30.0–36.0)
MCV: 93.1 fL (ref 80.0–100.0)
Monocytes Absolute: 0.3 10*3/uL (ref 0.1–1.0)
Monocytes Relative: 7 %
Neutro Abs: 2.5 10*3/uL (ref 1.7–7.7)
Neutrophils Relative %: 61 %
Platelets: 133 10*3/uL — ABNORMAL LOW (ref 150–400)
RBC: 2.9 MIL/uL — ABNORMAL LOW (ref 3.87–5.11)
RDW: 15.7 % — ABNORMAL HIGH (ref 11.5–15.5)
WBC: 4.1 10*3/uL (ref 4.0–10.5)
nRBC: 0 % (ref 0.0–0.2)

## 2019-08-29 LAB — RENAL FUNCTION PANEL
Albumin: 2.8 g/dL — ABNORMAL LOW (ref 3.5–5.0)
Anion gap: 8 (ref 5–15)
BUN: 15 mg/dL (ref 8–23)
CO2: 29 mmol/L (ref 22–32)
Calcium: 8.6 mg/dL — ABNORMAL LOW (ref 8.9–10.3)
Chloride: 101 mmol/L (ref 98–111)
Creatinine, Ser: 0.49 mg/dL (ref 0.44–1.00)
GFR calc Af Amer: >60 mL/min (ref >60)
GFR calc non Af Amer: >60 mL/min (ref >60)
Glucose, Bld: 134 mg/dL — ABNORMAL HIGH (ref 70–99)
Phosphorus: 3.6 mg/dL (ref 2.5–4.6)
Potassium: 4.1 mmol/L (ref 3.5–5.1)
Sodium: 138 mmol/L (ref 135–145)

## 2019-08-29 LAB — GLUCOSE, CAPILLARY
Glucose-Capillary: 105 mg/dL — ABNORMAL HIGH (ref 70–99)
Glucose-Capillary: 124 mg/dL — ABNORMAL HIGH (ref 70–99)
Glucose-Capillary: 88 mg/dL (ref 70–99)
Glucose-Capillary: 121 mg/dL — ABNORMAL HIGH (ref 70–99)

## 2019-08-29 MED ORDER — CHLORHEXIDINE GLUCONATE CLOTH 2 % EX PADS
6.0000 | MEDICATED_PAD | Freq: Every day | CUTANEOUS | Status: DC
  Administered 2019-08-29 – 2019-08-31 (×2): 6 via TOPICAL

## 2019-08-29 NOTE — Progress Notes (Signed)
Occupational Therapy Treatment Patient Details Name: Shannon Schultz MRN: [id number] DOB: 02-Jun-1946 Today's Date: 08/29/2019    History of present illness Shannon Schultz is a 74 y.o. female who presented with an intertrochanteric femur fracture and right anterior shoulder dislocation / nondisplaced greater tuberosity fracture. She underwent closed reduction of the shoulder, and a sling was placed. The patient was admitted to the hospitalist service and underwent perioperative risk stratification and medical optimization.   OT comments  Pt making progress with functional goals. Session focused on R UE sling wear, positioning and educated pt and her spouse on compensatory UB bathing and dressing techniques. Pt required mod A + 2 for sit - stand and SPTs using hemiwalker. Pt will now d/c to a SNF for ST rehab (CIR denied). OT will continue to follow acutely  Follow Up Recommendations  SNF    Equipment Recommendations  Other (comment)(TBD at next venue of care)    Recommendations for Other Services      Precautions / Restrictions Precautions Precautions: Fall Required Braces or Orthoses: Sling Restrictions Weight Bearing Restrictions: Yes RUE Weight Bearing: Non weight bearing RLE Weight Bearing: Weight bearing as tolerated Other Position/Activity Restrictions: WBAT R LE       Mobility Bed Mobility Overal bed mobility: Needs Assistance Bed Mobility: Sit to Supine     Supine to sit: Mod assist;+2 for physical assistance;+2 for safety/equipment Sit to supine: Mod assist;+2 for physical assistance;+2 for safety/equipment   General bed mobility comments: Increased time with cues for sequence and use of L LE to self assist - required assist for R LE and to boost trunk  Transfers Overall transfer level: Needs assistance Equipment used: (hemiwalker) Transfers: Sit to/from Stand Sit to Stand: +2 physical assistance;+2 safety/equipment;From elevated surface;Mod assist Stand pivot  transfers: Mod assist;+2 physical assistance;+2 safety/equipment;From elevated surface       General transfer comment: Cues for pushing with L UE and use of hemiwalker; cued for posture and straightening L LE    Balance Overall balance assessment: Needs assistance Sitting-balance support: No upper extremity supported;Feet supported Sitting balance-Leahy Scale: Fair     Standing balance support: Single extremity supported Standing balance-Leahy Scale: Poor                             ADL either performed or assessed with clinical judgement   ADL Overall ADL's : Needs assistance/impaired Eating/Feeding: Set up;Sitting   Grooming: Set up;Wash/dry face;Wash/dry hands   Upper Body Bathing: Minimal assistance;With caregiver independent assisting   Lower Body Bathing: Sitting/lateral leans;Maximal assistance;Moderate assistance   Upper Body Dressing : Moderate assistance;With caregiver independent assisting       Toilet Transfer: Moderate assistance;+2 for physical assistance;+2 for safety/equipment;Cueing for safety;Cueing for sequencing   Toileting- Clothing Manipulation and Hygiene: Total assistance         General ADL Comments: educated pt and her spouse on compensatory UB bathing and dressing techniques     Vision Patient Visual Report: No change from baseline     Perception     Praxis      Cognition Arousal/Alertness: Awake/alert Behavior During Therapy: WFL for tasks assessed/performed Overall Cognitive Status: Within Functional Limits for tasks assessed                                          Exercises General Exercises -  Lower Extremity Ankle Circles/Pumps: AROM;Both;15 reps;Supine Quad Sets: AROM;Both;10 reps;Supine Gluteal Sets: AROM;10 reps;Supine Long Arc Quad: AROM;Both;10 reps;Seated Heel Slides: AAROM;Supine;Right;10 reps Hip ABduction/ADduction: AAROM;Right;Supine;10 reps   Shoulder Instructions       General  Comments VSS    Pertinent Vitals/ Pain       Pain Assessment: 0-10 Pain Score: 3  Pain Location: R hip/shoulder Pain Descriptors / Indicators: Aching;Sore Pain Intervention(s): Limited activity within patient's tolerance;Monitored during session;Repositioned  Home Living                                          Prior Functioning/Environment              Frequency  Min 2X/week        Progress Toward Goals  OT Goals(current goals can now be found in the care plan section)  Progress towards OT goals: Progressing toward goals     Plan Discharge plan needs to be updated    Co-evaluation      Reason for Co-Treatment: Complexity of the patient's impairments (multi-system involvement);For patient/therapist safety   OT goals addressed during session: ADL's and self-care;Proper use of Adaptive equipment and DME      AM-PAC OT "6 Clicks" Daily Activity     Outcome Measure   Help from another person eating meals?: None Help from another person taking care of personal grooming?: A Little Help from another person toileting, which includes using toliet, bedpan, or urinal?: Total Help from another person bathing (including washing, rinsing, drying)?: A Lot Help from another person to put on and taking off regular upper body clothing?: A Lot Help from another person to put on and taking off regular lower body clothing?: A Lot 6 Click Score: 14    End of Session Equipment Utilized During Treatment: Gait belt;Other (comment)(R UE sling, hemiwalker)  OT Visit Diagnosis: Muscle weakness (generalized) (M62.81);Unsteadiness on feet (R26.81);Pain Pain - Right/Left: Right Pain - part of body: Shoulder;Leg   Activity Tolerance     Patient Left in bed;with call bell/phone within reach;with family/visitor present   Nurse Communication          Time: [id number] OT Time Calculation (min): 26 min  Charges: OT General Charges $OT Visit: 1 Visit OT  Treatments $Self Care/Home Management : 8-22 mins     [place], [place] 08/29/2019, 2:57 PM

## 2019-08-29 NOTE — Progress Notes (Signed)
Hospitalist progress note   Patient from home, Patient going home likely, Dispo dependent on physical therapy and work-up  Shannon Schultz [id number] DOB: 1946-04-08 DOA: 08/23/2019  PCP: [name], MD   Narrative:  32 white femaleCOPD with chronic hypoxia does not use chronic oxygen-last PFT 09/2017 FEV1 to 2%, HTN, HLD, aortic stenosis with moderate/severe stenosis echo 05/2019, TCP, DM TY 2, BMI 36, breast cancer status post lumpectomy 2019-bladder cancer status post resection intravesical chemo She had Covid in November She received the [place] vaccine 08/20/2019  Sustained accidental fall found to have closed right hip fracture-baseline meds 3-5   Data Reviewed:  Hemoglobin 12.6-->10.2-->9.1-->8.3 --7.8--1 U prbc 2/28-->8.6   Assessment & Plan:  Coronavirus positive result Patient is going to remain positive-this should not have a bearing on further clinical management- + test but completely recovered Closed hip fracture Right shoulder dislocation status post closed reduction with likely greater tuberosity fracture and shoulder immobilizer in place Status post surgery 2/24 Percocet seems to help with pain--she is ambulatory but need signif assistance because of multiple fractures and requires skilled services cannot go home Lovenox x 30 d-WBAT + walker Await CIR input regarding candidacy for inpatient rehab--no changes--back-up SNF Sinus tachycardia Probably from beta-blocker withdrawal/pain-resumed bisoprolol 10 mg daily--has resolved DM TY  2 A1c 5.4 CBG 105-138  eating 100% meals-back on Metformin 3 times daily plus SSI Expected blood loss anemia Hemoglobin is dropped to 8 range from baseline 12 Transfused 1 unit PRBC 2/28 Will need iron on discharge Moderate stable Aortic stenosi Needs repeat monitoring and further echocardiogram probably in the next 6 months to a year to denote progression-unclear if good surgical candidate coont asa 81 Gold stage II-III COPD FEV1 52% 2019  with no oxygen requirement usually at home Requires outpatient follow-up with her pulmonologist Cont Anoro Ellipta, albuteral, flonase Monitor trends HTN Bisoprolol resume holding Ziac Constipation Replace Colace with MiraLAX and senna  Breast cancer status post lumpectomy Bladder cancer status post resection chemo BMI 36   called husband and updated completely on 2/27  Subjective:  Fair no issue pain better Some better control no fever no chills Asking to keep foley in until d/c  Consultants: Orthopedics  Objective: Vitals:   08/28/19 2150 08/28/19 2212 08/29/19 0603 08/29/19 0741  BP: (!) 124/49 119/76 (!) 117/55 (!) 107/55  Pulse: 85 91 80 83  Resp: 17  18 18   Temp: 98.3 F (36.8 C)  98.1 F (36.7 C) 98.4 F (36.9 C)  TempSrc:   Oral Oral  SpO2: 100%  100% 99%  Weight:      Height:        Intake/Output Summary (Last 24 hours) at 08/29/2019 1239 Last data filed at 08/29/2019 [id number] Gross per 24 hour  Intake 1178 ml  Output 1701 ml  Net -523 ml   Filed Weights   08/23/19 2052  Weight: 95.7 kg    Examination:   pleasant lying in bed EOMI NCAT no focal deficit Q000111Q holosystolic murmur Chest clear no added sound no rale sno rhonchi Abdomen obese no tenderness slightly distended Neurologically intact right upper extremity in the sling  Scheduled Meds: . bisoprolol  10 mg Oral Daily  . Chlorhexidine Gluconate Cloth  6 each Topical Daily  . enoxaparin (LOVENOX) injection  40 mg Subcutaneous Q24H  . insulin aspart  0-9 Units Subcutaneous TID WC  . magic mouthwash  5 mL Oral TID  . metFORMIN  500 mg Oral TID WC  . senna-docusate  1 tablet  Oral QHS  . simvastatin  20 mg Oral QHS  . umeclidinium-vilanterol  1 puff Inhalation Daily   Continuous Infusions:    LOS: 6 days   Time spent: [place], MD [facility]  08/29/2019, 12:39 PM

## 2019-08-29 NOTE — TOC Initial Note (Addendum)
Transition of Care [facility]) - Initial/Assessment Note    Patient Details  Name: Shannon Schultz MRN: [id number] Date of Birth: [birth year]  Transition of Care [facility]) CM/SW Contact:    Leeroy Cha, RN Phone Number: 08/29/2019, 10:35 AM  Clinical Narrative:                 fl2 faxed out to area and surrounding area snf per md request. List of accept snf given to patient and husband will discuss with daughter and call me with deicsion, wanted [facility] [place] fl2 send to [facility]. [facility] preferred nsg home per the family.  Angela winrow to call back.tcf-An[facility] out of ne=network for insurance, tct-Bill Bertucci is agreeable with [facility]. Expected Discharge Plan: Skilled Nursing Facility Barriers to Discharge: Continued Medical Work up, SNF Pending bed offer   Patient Goals and CMS Choice Patient states their goals for this hospitalization and ongoing recovery are:: to go home CMS Medicare.gov Compare Post Acute Care list provided to:: Patient Choice offered to / list presented to : Patient  Expected Discharge Plan and Services Expected Discharge Plan: [place]   Discharge Planning Services: CM Consult Post Acute Care Choice: [place] Living arrangements for the past 2 months: Single Family Home                                      Prior Living Arrangements/Services Living arrangements for the past 2 months: Single Family Home Lives with:: Self Patient language and need for interpreter reviewed:: No Do you feel safe going back to the place where you live?: Yes      Need for Family Participation in Patient Care: Yes (Comment) Care giver support system in place?: Yes (comment)   Criminal Activity/Legal Involvement Pertinent to Current Situation/Hospitalization: No - Comment as needed  Activities of Daily Living Home Assistive Devices/Equipment: None ADL Screening (condition at time of admission) Patient's cognitive  ability adequate to safely complete daily activities?: Yes Is the patient deaf or have difficulty hearing?: No Does the patient have difficulty seeing, even when wearing glasses/contacts?: No Does the patient have difficulty concentrating, remembering, or making decisions?: No Patient able to express need for assistance with ADLs?: Yes Does the patient have difficulty dressing or bathing?: No Independently performs ADLs?: Yes (appropriate for developmental age) Does the patient have difficulty walking or climbing stairs?: Yes Weakness of Legs: Both Weakness of Arms/Hands: None  Permission Sought/Granted                  Emotional Assessment       Orientation: : Oriented to Place, Oriented to Self, Oriented to  Time, Oriented to Situation Alcohol / Substance Use: Not Applicable Psych Involvement: No (comment)  Admission diagnosis:  Preop examination [Z01.818] Closed right hip fracture ([place]) [S72.001A] Closed fracture of right hip, initial encounter ([place]) [S72.001A] Dislocation of right shoulder joint, initial encounter [S43.004A] Hip fracture ([place]) [S72.009A] Patient Active Problem List   Diagnosis Date Noted  . Hip fracture ([place]) 08/25/2019  . Closed right hip fracture ([place]) 08/23/2019  . Loss of taste 05/06/2019  . Comorbid condition 05/06/2019  . Close exposure to COVID-19 virus 05/06/2019  . Person under investigation for COVID-19 05/06/2019  . Cellulitis 09/10/2018  . Venous insufficiency 09/10/2018  . Sinusitis 06/25/2018  . Ophthalmic migraine 03/16/2018  . COPD, group B, by GOLD 2017 classification ([place]) 09/30/2017  . Chronic  hypoxemic respiratory failure ([place]) 09/29/2017  . Ventral hernia without obstruction or gangrene 05/15/2017  . Cirrhosis of liver without ascites ([place]) 03/11/2017  . Urothelial cancer ([place]) 03/10/2017  . Atypical ductal hyperplasia of left breast 12/04/2016  . Bladder mass 12/04/2016  . Atherosclerosis of coronary artery 09/14/2016  .  Aortic atherosclerosis ([place]) 09/14/2016  . Morbid obesity ([place]) 11/10/2015  . Thrombocytopenia ([place]) 08/17/2015  . Elevated troponin 08/17/2015  . Type 2 diabetes mellitus ([place]) 08/17/2015  . Aortic stenosis 03/08/2015  . Severe obesity (BMI >= 40) ([place]) 10/06/2014  . FHx: BRCA2 gene positive   . HSV (herpes simplex virus) infection 06/07/2012  . Pure hypercholesterolemia 11/13/2010  . Essential hypertension, benign 11/13/2010   PCP:  [name], MD Pharmacy:   [facility] [place], [address] [place] [address] Phone: [phone] Fax: [phone]  [facility] [place], [place] [address] Phone: [phone] Fax: [phone]     Social Determinants of Health (SDOH) Interventions    Readmission Risk Interventions No flowsheet data found.

## 2019-08-29 NOTE — Progress Notes (Signed)
Physical Therapy Treatment Patient Details Name: Shannon Schultz MRN: [id number] DOB: [birth year] Today's Date: 08/29/2019    History of Present Illness Shannon Schultz is a 74 y.o. female who presented with an intertrochanteric femur fracture and right anterior shoulder dislocation / nondisplaced greater tuberosity fracture. She underwent closed reduction of the shoulder, and a sling was placed. Pt s/p IM Nail R femur on 08/24/19.   PT Comments    Pt making gradual progress.  She is very motivated and willing to participate.  Pt requiring cues for transfer and exercise technique, as well as, AAROM for R LE exercises.  Pt having difficulty taking steps due to pain when WBing on R LE and only able to offload using L UE on hemiwalker.  She did demonstrate improved sit to stand, pivoting on L LE, and ability to advance R LE today.  Required mod A of 2 for mobility.    Follow Up Recommendations  CIR     Equipment Recommendations  None recommended by PT    Recommendations for Other Services       Precautions / Restrictions Precautions Precautions: Fall Required Braces or Orthoses: Sling Restrictions Weight Bearing Restrictions: Yes RUE Weight Bearing: Non weight bearing RLE Weight Bearing: Weight bearing as tolerated    Mobility  Bed Mobility Overal bed mobility: Needs Assistance Bed Mobility: Sit to Supine     Supine to sit: Mod assist;+2 for physical assistance;+2 for safety/equipment     General bed mobility comments: Increased time with cues for sequence and use of L LE to self assist - required assist for R LE and to boost trunk  Transfers Overall transfer level: Needs assistance Equipment used: Rolling walker (2 wheeled) Transfers: Sit to/from Stand Sit to Stand: +2 physical assistance;+2 safety/equipment;From elevated surface;Mod assist Stand pivot transfers: Mod assist;+2 physical assistance;+2 safety/equipment;From elevated surface       General transfer comment: Cues  for pushing with L UE and use of hemiwalker; cued for posture and straightening L LE  Ambulation/Gait Ambulation/Gait assistance: Mod assist;+2 physical assistance;+2 safety/equipment Gait Distance (Feet): 2 Feet Assistive device: Rolling walker (2 wheeled) Gait Pattern/deviations: Step-to pattern;Shuffle;Trunk flexed;Decreased stride length;Decreased stance time - right Gait velocity: decr   General Gait Details: cued for posture and use of hemiwalker; required assist with R LE for first step but able to complete 3 steps with R LE; assist and cues for weight shifting; unable to step with L LE (due to wbing on R LE) and pivoted on LLE   Stairs             Wheelchair Mobility    Modified Rankin (Stroke Patients Only)       Balance Overall balance assessment: Needs assistance Sitting-balance support: No upper extremity supported;Feet supported Sitting balance-Leahy Scale: Fair     Standing balance support: Single extremity supported Standing balance-Leahy Scale: Poor                              Cognition Arousal/Alertness: Awake/alert Behavior During Therapy: WFL for tasks assessed/performed Overall Cognitive Status: Within Functional Limits for tasks assessed                                        Exercises General Exercises - Lower Extremity Ankle Circles/Pumps: AROM;Both;15 reps;Supine Quad Sets: AROM;Both;10 reps;Supine Gluteal Sets: AROM;10 reps;Supine Long Arc Quad: AROM;Both;10  reps;Seated Heel Slides: AAROM;Supine;Right;10 reps Hip ABduction/ADduction: AAROM;Right;Supine;10 reps    General Comments General comments (skin integrity, edema, etc.): VSS      Pertinent Vitals/Pain Pain Assessment: 0-10 Pain Score: 4  Pain Location: R hip/shoulder Pain Descriptors / Indicators: Aching;Sore Pain Intervention(s): Limited activity within patient's tolerance;Monitored during session;Premedicated before  session;Repositioned;Relaxation    Home Living                      Prior Function            PT Goals (current goals can now be found in the care plan section) Progress towards PT goals: Progressing toward goals    Frequency    Min 5X/week      PT Plan Current plan remains appropriate    Co-evaluation              AM-PAC PT "6 Clicks" Mobility   Outcome Measure  Help needed turning from your back to your side while in a flat bed without using bedrails?: A Lot Help needed moving from lying on your back to sitting on the side of a flat bed without using bedrails?: A Lot Help needed moving to and from a bed to a chair (including a wheelchair)?: A Lot Help needed standing up from a chair using your arms (e.g., wheelchair or bedside chair)?: A Lot Help needed to walk in hospital room?: A Lot Help needed climbing 3-5 steps with a railing? : Total 6 Click Score: 11    End of Session Equipment Utilized During Treatment: Gait belt Activity Tolerance: Patient tolerated treatment well;Patient limited by pain Patient left: in bed;with call bell/phone within reach;with bed alarm set   PT Visit Diagnosis: History of falling (Z91.81);Difficulty in walking, not elsewhere classified (R26.2);Pain Pain - Right/Left: Right Pain - part of body: Hip;Shoulder     Time: [id number] PT Time Calculation (min) (ACUTE ONLY): 23 min  Charges:  $Therapeutic Exercise: 8-22 mins $Therapeutic Activity: 8-22 mins                     Maggie Font, PT Acute Rehab Services Pager [phone] [facility] [phone] [facility] [phone]    Karlton Lemon 08/29/2019, 1:55 PM

## 2019-08-29 NOTE — NC FL2 (Signed)
[place] LEVEL OF CARE SCREENING TOOL     IDENTIFICATION  Patient Name: Shannon Schultz Birthdate: Jun 04, 1946 Sex: female Admission Date (Current Location): 08/23/2019  [facility] and [place] Number:  Herbalist and Address:  [facility],  [place] [address] [place]      Provider Number: [id number]  Attending Physician Name and Address:  Nita Sells, MD  Relative Name and Phone Number:       Current Level of Care: Hospital Recommended Level of Care: [place] Prior Approval Number:    Date Approved/Denied:   PASRR Number: [id number]  Discharge Plan: SNF    Current Diagnoses: Patient Active Problem List   Diagnosis Date Noted  . Hip fracture ([place]) 08/25/2019  . Closed right hip fracture ([place]) 08/23/2019  . Loss of taste 05/06/2019  . Comorbid condition 05/06/2019  . Close exposure to COVID-19 virus 05/06/2019  . Person under investigation for COVID-19 05/06/2019  . Cellulitis 09/10/2018  . Venous insufficiency 09/10/2018  . Sinusitis 06/25/2018  . Ophthalmic migraine 03/16/2018  . COPD, group B, by GOLD 2017 classification ([place]) 09/30/2017  . Chronic hypoxemic respiratory failure ([place]) 09/29/2017  . Ventral hernia without obstruction or gangrene 05/15/2017  . Cirrhosis of liver without ascites ([place]) 03/11/2017  . Urothelial cancer ([place]) 03/10/2017  . Atypical ductal hyperplasia of left breast 12/04/2016  . Bladder mass 12/04/2016  . Atherosclerosis of coronary artery 09/14/2016  . Aortic atherosclerosis ([place]) 09/14/2016  . Morbid obesity ([place]) 11/10/2015  . Thrombocytopenia ([place]) 08/17/2015  . Elevated troponin 08/17/2015  . Type 2 diabetes mellitus ([place]) 08/17/2015  . Aortic stenosis 03/08/2015  . Severe obesity (BMI >= 40) ([place]) 10/06/2014  . FHx: BRCA2 gene positive   . HSV (herpes simplex virus) infection 06/07/2012  . Pure hypercholesterolemia 11/13/2010  . Essential hypertension, benign  11/13/2010    Orientation RESPIRATION BLADDER Height & Weight     Self, Situation, Time, Place  Normal Continent Weight: 95.7 kg Height:  '5\' 4"'  (162.6 cm)  BEHAVIORAL SYMPTOMS/MOOD NEUROLOGICAL BOWEL NUTRITION STATUS        Diet(regular)  AMBULATORY STATUS COMMUNICATION OF NEEDS Skin     Verbally Normal                       Personal Care Assistance Level of Assistance  Feeding, Bathing, Dressing Bathing Assistance: Limited assistance   Dressing Assistance: Limited assistance     Functional Limitations Info  Sight, Hearing, Speech Sight Info: Adequate Hearing Info: Adequate Speech Info: Adequate    SPECIAL CARE FACTORS FREQUENCY  PT (By licensed PT)     PT Frequency: 5xweekly              Contractures Contractures Info: Not present    Additional Factors Info  Code Status Code Status Info: full             Current Medications (08/29/2019):  This is the current hospital active medication list Current Facility-Administered Medications  Medication Dose Route Frequency Provider Last Rate Last Admin  . albuterol (PROVENTIL) (2.5 MG/3ML) 0.083% nebulizer solution 2.5 mg  2.5 mg Nebulization Q6H PRN Swinteck, Aaron Edelman, MD      . bisoprolol (ZEBETA) tablet 10 mg  10 mg Oral Daily Nita Sells, MD   10 mg at 08/29/19 0937  . Chlorhexidine Gluconate Cloth 2 % PADS 6 each  6 each Topical Daily Samtani, Jai-Gurmukh, MD      . enoxaparin (LOVENOX) injection 40 mg  40 mg Subcutaneous Q24H Rod Can, MD   40 mg at 08/29/19 0803  . hydrOXYzine (ATARAX/VISTARIL) tablet 10 mg  10 mg Oral TID PRN Nita Sells, MD   10 mg at 08/29/19 0655  . insulin aspart (novoLOG) injection 0-9 Units  0-9 Units Subcutaneous TID WC Blount, Lolita Cram, NP   1 Units at 08/28/19 1711  . magic mouthwash  5 mL Oral TID Rod Can, MD   5 mL at 08/29/19 0937  . menthol-cetylpyridinium (CEPACOL) lozenge 3 mg  1 lozenge Oral PRN Swinteck, Aaron Edelman, MD       Or  . phenol  (CHLORASEPTIC) mouth spray 1 spray  1 spray Mouth/Throat PRN Swinteck, Aaron Edelman, MD      . metFORMIN (GLUCOPHAGE-XR) 24 hr tablet 500 mg  500 mg Oral TID WC Nita Sells, MD   500 mg at 08/29/19 0803  . methocarbamol (ROBAXIN) tablet 500 mg  500 mg Oral Q6H PRN Rod Can, MD   500 mg at 08/29/19 0655  . ondansetron (ZOFRAN) tablet 4 mg  4 mg Oral Q6H PRN Swinteck, Aaron Edelman, MD       Or  . ondansetron (ZOFRAN) injection 4 mg  4 mg Intravenous Q6H PRN Swinteck, Aaron Edelman, MD      . oxyCODONE-acetaminophen (PERCOCET/ROXICET) 5-325 MG per tablet 1-2 tablet  1-2 tablet Oral Q4H PRN Nita Sells, MD   2 tablet at 08/29/19 0655  . senna-docusate (Senokot-S) tablet 1 tablet  1 tablet Oral QHS Nita Sells, MD   1 tablet at 08/28/19 2010  . simvastatin (ZOCOR) tablet 20 mg  20 mg Oral QHS Swinteck, Aaron Edelman, MD   20 mg at 08/28/19 2010  . umeclidinium-vilanterol (ANORO ELLIPTA) 62.5-25 MCG/INH 1 puff  1 puff Inhalation Daily Rod Can, MD   1 puff at 08/29/19 0759     Discharge Medications: Please see discharge summary for a list of discharge medications.  Relevant Imaging Results:  Relevant Lab Results:   Additional Information [id number]  Leeroy Cha, RN

## 2019-08-30 LAB — CBC WITH DIFFERENTIAL/PLATELET
Abs Immature Granulocytes: 0.05 10*3/uL (ref 0.00–0.07)
Basophils Absolute: 0 10*3/uL (ref 0.0–0.1)
Basophils Relative: 1 %
Eosinophils Absolute: 0.1 10*3/uL (ref 0.0–0.5)
Eosinophils Relative: 2 %
HCT: 28 % — ABNORMAL LOW (ref 36.0–46.0)
Hemoglobin: 8.6 g/dL — ABNORMAL LOW (ref 12.0–15.0)
Immature Granulocytes: 1 %
Lymphocytes Relative: 24 %
Lymphs Abs: 1 10*3/uL (ref 0.7–4.0)
MCH: 28.8 pg (ref 26.0–34.0)
MCHC: 30.7 g/dL (ref 30.0–36.0)
MCV: 93.6 fL (ref 80.0–100.0)
Monocytes Absolute: 0.4 10*3/uL (ref 0.1–1.0)
Monocytes Relative: 9 %
Neutro Abs: 2.6 10*3/uL (ref 1.7–7.7)
Neutrophils Relative %: 63 %
Platelets: 140 10*3/uL — ABNORMAL LOW (ref 150–400)
RBC: 2.99 MIL/uL — ABNORMAL LOW (ref 3.87–5.11)
RDW: 16 % — ABNORMAL HIGH (ref 11.5–15.5)
WBC: 4.2 10*3/uL (ref 4.0–10.5)
nRBC: 0 % (ref 0.0–0.2)

## 2019-08-30 LAB — RENAL FUNCTION PANEL
Albumin: 2.7 g/dL — ABNORMAL LOW (ref 3.5–5.0)
Anion gap: 8 (ref 5–15)
BUN: 16 mg/dL (ref 8–23)
CO2: 27 mmol/L (ref 22–32)
Calcium: 8.5 mg/dL — ABNORMAL LOW (ref 8.9–10.3)
Chloride: 103 mmol/L (ref 98–111)
Creatinine, Ser: 0.48 mg/dL (ref 0.44–1.00)
GFR calc Af Amer: >60 mL/min (ref >60)
GFR calc non Af Amer: >60 mL/min (ref >60)
Glucose, Bld: 125 mg/dL — ABNORMAL HIGH (ref 70–99)
Phosphorus: 2.9 mg/dL (ref 2.5–4.6)
Potassium: 4.1 mmol/L (ref 3.5–5.1)
Sodium: 138 mmol/L (ref 135–145)

## 2019-08-30 LAB — GLUCOSE, CAPILLARY
Glucose-Capillary: 129 mg/dL — ABNORMAL HIGH (ref 70–99)
Glucose-Capillary: 162 mg/dL — ABNORMAL HIGH (ref 70–99)
Glucose-Capillary: 153 mg/dL — ABNORMAL HIGH (ref 70–99)

## 2019-08-30 LAB — SARS CORONAVIRUS 2 (TAT 6-24 HRS): SARS Coronavirus 2: NEGATIVE

## 2019-08-30 NOTE — Care Management Important Message (Signed)
Important Message  Patient Details IM Letter given to Velva Harman RN Case Manager to present to the Patient Name: Shannon Schultz MRN: [id number] Date of Birth: 16-Dec-1945   Medicare Important Message Given:  Yes     Kerin Salen 08/30/2019, 11:20 AM

## 2019-08-30 NOTE — TOC Progression Note (Signed)
Transition of Care [facility]) - Progression Note    Patient Details  Name: Shannon Schultz MRN: [id number] Date of Birth: Jun 03, 1946  Transition of Care [facility]) CM/SW Contact  Leeroy Cha, RN Phone Number: 08/30/2019, 9:44 AM  Clinical Narrative:    covid test from [phone] is negative awaiting insurance auth.  Let let kenishia at [facility] be aware of test results.   Expected Discharge Plan: [place] Barriers to Discharge: Continued Medical Work up, SNF Pending bed offer  Expected Discharge Plan and Services Expected Discharge Plan: [place]   Discharge Planning Services: CM Consult Post Acute Care Choice: [place] Living arrangements for the past 2 months: Single Family Home                                       Social Determinants of Health (SDOH) Interventions    Readmission Risk Interventions No flowsheet data found.

## 2019-08-30 NOTE — Progress Notes (Signed)
Inpatient Rehabilitation-Admissions Coordinator   [facility] is still waiting on an insurance determination for CIR request. Will update once there has been a determination.   Raechel Ache, OTR/L  Rehab Admissions Coordinator  [phone] 08/30/2019 10:49 AM

## 2019-08-30 NOTE — Progress Notes (Signed)
Physical Therapy Treatment Patient Details Name: Shannon Schultz MRN: [id number] DOB: Aug 29, 1945 Today's Date: 08/30/2019    History of Present Illness Shannon Schultz is a 74 y.o. female who presented with an intertrochanteric femur fracture and right anterior shoulder dislocation / nondisplaced greater tuberosity fracture. She underwent closed reduction of the shoulder, and a sling was placed. The patient was admitted to the hospitalist service and underwent perioperative risk stratification and medical optimization.    PT Comments    Assisted pt from recliner to bed with +2 mod assist. Pain in R hip significantly limiting activity tolerance. Pt puts forth good effort.   Follow Up Recommendations  CIR     Equipment Recommendations  None recommended by PT    Recommendations for Other Services       Precautions / Restrictions Precautions Precautions: Fall Required Braces or Orthoses: Sling Restrictions Weight Bearing Restrictions: Yes RUE Weight Bearing: Non weight bearing RLE Weight Bearing: Weight bearing as tolerated Other Position/Activity Restrictions: WBAT R LE    Mobility  Bed Mobility Overal bed mobility: Needs Assistance Bed Mobility: Sit to Supine     Supine to sit: Mod assist;+2 for physical assistance;+2 for safety/equipment Sit to supine: +2 for physical assistance;Max assist   General bed mobility comments: Increased time with cues for sequence and use of L LE to self assist - required assist for R LE and to control descent of trunk  Transfers Overall transfer level: Needs assistance Equipment used: Rolling walker (2 wheeled) Transfers: Sit to/from Stand Sit to Stand: +2 physical assistance;+2 safety/equipment;From elevated surface;Mod assist Stand pivot transfers: Mod assist;+2 physical assistance;+2 safety/equipment;From elevated surface       General transfer comment: Cues for pushing with L UE and use of hemiwalker; cued for posture and straightening  L LE  Ambulation/Gait                 Stairs             Wheelchair Mobility    Modified Rankin (Stroke Patients Only)       Balance Overall balance assessment: Needs assistance Sitting-balance support: No upper extremity supported;Feet supported Sitting balance-Leahy Scale: Fair     Standing balance support: Single extremity supported Standing balance-Leahy Scale: Poor                              Cognition Arousal/Alertness: Awake/alert Behavior During Therapy: WFL for tasks assessed/performed Overall Cognitive Status: Within Functional Limits for tasks assessed                                           General Comments        Pertinent Vitals/Pain Pain Score: 6  Pain Location: R hip Pain Descriptors / Indicators: Sore;Grimacing;Guarding Pain Intervention(s): Limited activity within patient's tolerance;Monitored during session;Premedicated before session;Repositioned    Home Living                      Prior Function            PT Goals (current goals can now be found in the care plan section) Acute Rehab PT Goals Patient Stated Goal: See my grand babies PT Goal Formulation: With patient Time For Goal Achievement: 09/08/19 Potential to Achieve Goals: Fair Progress towards PT goals: Progressing toward goals    Frequency  Min 5X/week      PT Plan Current plan remains appropriate    Co-evaluation              AM-PAC PT "6 Clicks" Mobility   Outcome Measure  Help needed turning from your back to your side while in a flat bed without using bedrails?: A Lot Help needed moving from lying on your back to sitting on the side of a flat bed without using bedrails?: A Lot Help needed moving to and from a bed to a chair (including a wheelchair)?: A Lot Help needed standing up from a chair using your arms (e.g., wheelchair or bedside chair)?: A Lot Help needed to walk in hospital room?: A  Lot Help needed climbing 3-5 steps with a railing? : Total 6 Click Score: 11    End of Session Equipment Utilized During Treatment: Gait belt Activity Tolerance: Patient tolerated treatment well;Patient limited by pain Patient left: with call bell/phone within reach;in bed;with bed alarm set;with family/visitor present Nurse Communication: Mobility status PT Visit Diagnosis: History of falling (Z91.81);Difficulty in walking, not elsewhere classified (R26.2);Pain Pain - Right/Left: Right Pain - part of body: Hip;Shoulder     Time: [id number] PT Time Calculation (min) (ACUTE ONLY): 12 min  Charges:  $Therapeutic Activity: 8-22 mins                     Blondell Reveal Kistler PT 08/30/2019  Acute Rehabilitation Services Pager [phone] Office [phone]

## 2019-08-30 NOTE — Progress Notes (Signed)
Physical Therapy Treatment Patient Details Name: Shannon Schultz MRN: [id number] DOB: [birth year] Today's Date: 08/30/2019    History of Present Illness Shannon Schultz is a 74 y.o. female who presented with an intertrochanteric femur fracture and right anterior shoulder dislocation / nondisplaced greater tuberosity fracture. She underwent closed reduction of the shoulder, and a sling was placed. The patient was admitted to the hospitalist service and underwent perioperative risk stratification and medical optimization.    PT Comments    +2 assist for stand pivot transfer with hemiwalker. Performed RLE strengthening exercises. Activity tolerance limited by pain.   Follow Up Recommendations  CIR     Equipment Recommendations  None recommended by PT    Recommendations for Other Services       Precautions / Restrictions Precautions Precautions: Fall Required Braces or Orthoses: Sling Restrictions Weight Bearing Restrictions: Yes RUE Weight Bearing: Non weight bearing RLE Weight Bearing: Weight bearing as tolerated Other Position/Activity Restrictions: WBAT R LE    Mobility  Bed Mobility Overal bed mobility: Needs Assistance Bed Mobility: Sit to Supine     Supine to sit: Mod assist;+2 for physical assistance;+2 for safety/equipment     General bed mobility comments: Increased time with cues for sequence and use of L LE to self assist - required assist for R LE and to boost trunk  Transfers Overall transfer level: Needs assistance Equipment used: Rolling walker (2 wheeled) Transfers: Sit to/from Stand Sit to Stand: +2 physical assistance;+2 safety/equipment;From elevated surface;Mod assist Stand pivot transfers: Mod assist;+2 physical assistance;+2 safety/equipment;From elevated surface       General transfer comment: Cues for pushing with L UE and use of hemiwalker; cued for posture and straightening L LE  Ambulation/Gait                 Stairs              Wheelchair Mobility    Modified Rankin (Stroke Patients Only)       Balance Overall balance assessment: Needs assistance Sitting-balance support: No upper extremity supported;Feet supported Sitting balance-Leahy Scale: Fair     Standing balance support: Single extremity supported Standing balance-Leahy Scale: Poor                              Cognition Arousal/Alertness: Awake/alert Behavior During Therapy: WFL for tasks assessed/performed Overall Cognitive Status: Within Functional Limits for tasks assessed                                        Exercises General Exercises - Lower Extremity Ankle Circles/Pumps: AROM;Both;15 reps;Supine Quad Sets: AROM;Both;10 reps;Supine Heel Slides: AAROM;Supine;Right;10 reps Hip ABduction/ADduction: AAROM;Right;Supine;10 reps    General Comments        Pertinent Vitals/Pain Pain Score: 8  Pain Location: R hip Pain Descriptors / Indicators: Sore Pain Intervention(s): Limited activity within patient's tolerance;Monitored during session;Premedicated before session;Relaxation    Home Living                      Prior Function            PT Goals (current goals can now be found in the care plan section) Acute Rehab PT Goals Patient Stated Goal: See my grand babies PT Goal Formulation: With patient Time For Goal Achievement: 09/08/19 Potential to Achieve Goals: Fair Progress towards PT  goals: Progressing toward goals    Frequency    Min 5X/week      PT Plan Current plan remains appropriate    Co-evaluation              AM-PAC PT "6 Clicks" Mobility   Outcome Measure  Help needed turning from your back to your side while in a flat bed without using bedrails?: A Lot Help needed moving from lying on your back to sitting on the side of a flat bed without using bedrails?: A Lot Help needed moving to and from a bed to a chair (including a wheelchair)?: A Lot Help needed  standing up from a chair using your arms (e.g., wheelchair or bedside chair)?: A Lot Help needed to walk in hospital room?: A Lot Help needed climbing 3-5 steps with a railing? : Total 6 Click Score: 11    End of Session Equipment Utilized During Treatment: Gait belt Activity Tolerance: Patient tolerated treatment well;Patient limited by pain Patient left: with call bell/phone within reach;in chair;with chair alarm set Nurse Communication: Mobility status PT Visit Diagnosis: History of falling (Z91.81);Difficulty in walking, not elsewhere classified (R26.2);Pain Pain - Right/Left: Right Pain - part of body: Hip;Shoulder     Time: 1208-1228 PT Time Calculation (min) (ACUTE ONLY): 20 min  Charges:  $Therapeutic Activity: 8-22 mins                    Blondell Reveal Kistler PT 08/30/2019  Acute Rehabilitation Services Pager [phone] Office [phone]

## 2019-08-30 NOTE — Progress Notes (Signed)
[facility] Progress Note  Patient: Shannon Schultz    [id number]  DOA: 08/23/2019     Date of Service: the patient was seen and examined on 08/30/2019  Chief Complaint  Patient presents with  . Fall  . Shoulder Pain  . Hip Pain   Brief hospital course: Past medical history significant for COPD, chronic hypoxic respiratory failure, PVD, HTN, HLD, moderate aortic stenosis, liver cirrhosis, thrombocytopenia, bladder cancer, type II DM.  Patient presented with a mechanical fall. Currently further plan is arranged for rehab.  Assessment and Plan: 1.  Closed right hip fracture Weightbearing as tolerated with walker. SP intramedullary nail femur placement. DVT prophylaxis with Lovenox for 30 days.  SCD.  Status. P.o. pain control by orthopedics. Initial recommendation was to go to CIR but currently patient will be going to SNF for discharge.  2.  Right shoulder dislocation SP closed reduction. Shoulder immobilizer for comfort. Nonweightbearing on the shoulder.  3.  Prior Covid infection Patient is Covid positive. Patient has completed her Covid treatment. She does not have any acute symptoms of Covid. She is suffering from a chronic respiratory failure from Covid. Does not require further work-up for the treatment. Does not require further escalation.  4.  Moderate aortic stenosis. Essential hypertension. Sinus tachycardia Continue beta-blocker. Monitor on telemetry. Will require outpatient cardiology consultation. Continue 81 mg aspirin.  5.  History of COPD Chronic respiratory failure Currently stable. Continue inhalers and nebulizers.  6.  Type 2 diabetes mellitus.  Controlled without any complication. Continue sensitive sliding scale. Metformin on hold.  7.  Constipation. Continue stool softeners.  Prior history of breast cancer as well as bladder cancer.  Currently stable. Body mass index is 36.22 kg/m.   Diet: Regular diet DVT Prophylaxis:  Subcutaneous Lovenox   Advance goals of care discussion: Full code  Family Communication: no family was present at bedside, at the time of interview.   Disposition:  Pt is from home, admitted with fall SP shoulder dislocation as well as right hip fracture, still has difficulty navigating going back home due to nonweightbearing status of the right upper extremity and currently awaiting SNF authorization, which precludes a safe discharge. Discharge to SNF, when authorization available.  Subjective: Pain well controlled.  No nausea no vomiting.  No acute complaint.  No fever no chills.  Physical Exam: General:  alert oriented to time, place, and person.  Appear in mild distress, affect appropriate Eyes: PERRL ENT: Oral Mucosa Clear, moist  Neck: no JVD,  Cardiovascular: S1 and S2 Present, aortic systolic Murmur,  Respiratory: good respiratory effort, Bilateral Air entry equal and Decreased, no Crackles, no wheezes Abdomen: Bowel Sound present, Soft and no tenderness,  Skin: no rash Extremities: no Pedal edema, no calf tenderness Neurologic: without any new focal findings  Gait not checked due to patient safety concerns  Vitals:   08/29/19 1358 08/29/19 2147 08/30/19 0824 08/30/19 1401  BP: 112/63 (!) 123/59  132/61  Pulse: 74 (!) 105  (!) 107  Resp: 18   18  Temp: 98.1 F (36.7 C) 98.3 F (36.8 C)  98.3 F (36.8 C)  TempSrc: Oral Oral  Oral  SpO2: 99% 100% 95% 94%  Weight:      Height:        Intake/Output Summary (Last 24 hours) at 08/30/2019 1900 Last data filed at 08/30/2019 1825 Gross per 24 hour  Intake 1140 ml  Output 1200 ml  Net -60 ml   Filed Weights   08/23/19 2052  Weight: 95.7 kg    Data Reviewed: I have personally reviewed and interpreted daily labs, tele strips, imagings as discussed above. I reviewed all nursing notes, pharmacy notes, vitals, pertinent old records I have discussed plan of care as described above with RN and  patient/family.  CBC: Recent Labs  Lab 08/23/19 1958 08/24/19 0156 08/27/19 0344 08/28/19 0519 08/28/19 0841 08/29/19 0438 08/30/19 0351  WBC 7.1   < > 5.5 4.5 5.1 4.1 4.2  NEUTROABS 6.1  --   --  3.0  --  2.5 2.6  HGB 13.0   < > 8.3* 7.8* 8.6* 8.6* 8.6*  HCT 40.0   < > 25.2* 24.7* 26.5* 27.0* 28.0*  MCV 90.7   < > 91.0 92.9 92.7 93.1 93.6  PLT 116*   < > 124* 129* 124* 133* 140*   < > = values in this interval not displayed.   Basic Metabolic Panel: Recent Labs  Lab 08/25/19 0309 08/26/19 0400 08/28/19 0519 08/29/19 0438 08/30/19 0351  NA 135 136 137 138 138  K 4.3 3.9 3.9 4.1 4.1  CL 102 99 102 101 103  CO2 25 29 27 29 27   GLUCOSE 161* 127* 147* 134* 125*  BUN 15 17 16 15 16   CREATININE 0.56 0.51 0.44 0.49 0.48  CALCIUM 8.6* 8.5* 8.4* 8.6* 8.5*  PHOS  --   --  3.3 3.6 2.9    Studies: No results found.  Scheduled Meds: . bisoprolol  10 mg Oral Daily  . Chlorhexidine Gluconate Cloth  6 each Topical Daily  . enoxaparin (LOVENOX) injection  40 mg Subcutaneous Q24H  . insulin aspart  0-9 Units Subcutaneous TID WC  . magic mouthwash  5 mL Oral TID  . metFORMIN  500 mg Oral TID WC  . senna-docusate  1 tablet Oral QHS  . simvastatin  20 mg Oral QHS  . umeclidinium-vilanterol  1 puff Inhalation Daily   Continuous Infusions: PRN Meds: albuterol, hydrOXYzine, menthol-cetylpyridinium **OR** phenol, methocarbamol **OR** [DISCONTINUED] methocarbamol (ROBAXIN) IV, ondansetron **OR** ondansetron (ZOFRAN) IV, oxyCODONE-acetaminophen  Time spent: 35 minutes  Author: Berle Mull, MD [facility] 08/30/2019 7:00 PM  To reach On-call, see care teams to locate the attending and reach out to them via [email or web address]. If 7PM-7AM, please contact night-coverage If you still have difficulty reaching the attending provider, please page the [facility] (Director on Call) for [facility] on amion for assistance.

## 2019-08-31 DIAGNOSIS — E1169 Type 2 diabetes mellitus with other specified complication: Secondary | ICD-10-CM | POA: Diagnosis not present

## 2019-08-31 DIAGNOSIS — I471 Supraventricular tachycardia: Secondary | ICD-10-CM | POA: Diagnosis not present

## 2019-08-31 DIAGNOSIS — J984 Other disorders of lung: Secondary | ICD-10-CM | POA: Diagnosis not present

## 2019-08-31 DIAGNOSIS — R262 Difficulty in walking, not elsewhere classified: Secondary | ICD-10-CM | POA: Diagnosis not present

## 2019-08-31 DIAGNOSIS — G8918 Other acute postprocedural pain: Secondary | ICD-10-CM | POA: Diagnosis not present

## 2019-08-31 DIAGNOSIS — M2550 Pain in unspecified joint: Secondary | ICD-10-CM | POA: Diagnosis not present

## 2019-08-31 DIAGNOSIS — M6281 Muscle weakness (generalized): Secondary | ICD-10-CM | POA: Diagnosis not present

## 2019-08-31 DIAGNOSIS — S43004S Unspecified dislocation of right shoulder joint, sequela: Secondary | ICD-10-CM | POA: Diagnosis not present

## 2019-08-31 DIAGNOSIS — J9610 Chronic respiratory failure, unspecified whether with hypoxia or hypercapnia: Secondary | ICD-10-CM | POA: Diagnosis not present

## 2019-08-31 DIAGNOSIS — S72141A Displaced intertrochanteric fracture of right femur, initial encounter for closed fracture: Secondary | ICD-10-CM | POA: Diagnosis not present

## 2019-08-31 DIAGNOSIS — E668 Other obesity: Secondary | ICD-10-CM | POA: Diagnosis not present

## 2019-08-31 DIAGNOSIS — S72141S Displaced intertrochanteric fracture of right femur, sequela: Secondary | ICD-10-CM | POA: Diagnosis not present

## 2019-08-31 DIAGNOSIS — Z9181 History of falling: Secondary | ICD-10-CM | POA: Diagnosis not present

## 2019-08-31 DIAGNOSIS — B3781 Candidal esophagitis: Secondary | ICD-10-CM | POA: Diagnosis not present

## 2019-08-31 DIAGNOSIS — B370 Candidal stomatitis: Secondary | ICD-10-CM | POA: Diagnosis not present

## 2019-08-31 DIAGNOSIS — S72141D Displaced intertrochanteric fracture of right femur, subsequent encounter for closed fracture with routine healing: Secondary | ICD-10-CM | POA: Diagnosis not present

## 2019-08-31 DIAGNOSIS — G4733 Obstructive sleep apnea (adult) (pediatric): Secondary | ICD-10-CM | POA: Diagnosis not present

## 2019-08-31 DIAGNOSIS — E119 Type 2 diabetes mellitus without complications: Secondary | ICD-10-CM | POA: Diagnosis not present

## 2019-08-31 DIAGNOSIS — E7800 Pure hypercholesterolemia, unspecified: Secondary | ICD-10-CM | POA: Diagnosis not present

## 2019-08-31 DIAGNOSIS — R42 Dizziness and giddiness: Secondary | ICD-10-CM | POA: Diagnosis not present

## 2019-08-31 DIAGNOSIS — R52 Pain, unspecified: Secondary | ICD-10-CM | POA: Diagnosis not present

## 2019-08-31 DIAGNOSIS — R2681 Unsteadiness on feet: Secondary | ICD-10-CM | POA: Diagnosis not present

## 2019-08-31 DIAGNOSIS — Z20828 Contact with and (suspected) exposure to other viral communicable diseases: Secondary | ICD-10-CM | POA: Diagnosis not present

## 2019-08-31 DIAGNOSIS — S42291A Other displaced fracture of upper end of right humerus, initial encounter for closed fracture: Secondary | ICD-10-CM

## 2019-08-31 DIAGNOSIS — I350 Nonrheumatic aortic (valve) stenosis: Secondary | ICD-10-CM | POA: Diagnosis not present

## 2019-08-31 DIAGNOSIS — S43004A Unspecified dislocation of right shoulder joint, initial encounter: Secondary | ICD-10-CM | POA: Diagnosis not present

## 2019-08-31 DIAGNOSIS — K7460 Unspecified cirrhosis of liver: Secondary | ICD-10-CM | POA: Diagnosis not present

## 2019-08-31 DIAGNOSIS — M25511 Pain in right shoulder: Secondary | ICD-10-CM | POA: Diagnosis not present

## 2019-08-31 DIAGNOSIS — W19XXXA Unspecified fall, initial encounter: Secondary | ICD-10-CM | POA: Diagnosis not present

## 2019-08-31 DIAGNOSIS — U071 COVID-19: Secondary | ICD-10-CM | POA: Diagnosis not present

## 2019-08-31 DIAGNOSIS — S72001A Fracture of unspecified part of neck of right femur, initial encounter for closed fracture: Secondary | ICD-10-CM | POA: Diagnosis not present

## 2019-08-31 DIAGNOSIS — Z7401 Bed confinement status: Secondary | ICD-10-CM | POA: Diagnosis not present

## 2019-08-31 DIAGNOSIS — I10 Essential (primary) hypertension: Secondary | ICD-10-CM | POA: Diagnosis not present

## 2019-08-31 DIAGNOSIS — J449 Chronic obstructive pulmonary disease, unspecified: Secondary | ICD-10-CM | POA: Diagnosis not present

## 2019-08-31 DIAGNOSIS — S7291XA Unspecified fracture of right femur, initial encounter for closed fracture: Secondary | ICD-10-CM | POA: Diagnosis not present

## 2019-08-31 LAB — GLUCOSE, CAPILLARY
Glucose-Capillary: 114 mg/dL — ABNORMAL HIGH (ref 70–99)
Glucose-Capillary: 115 mg/dL — ABNORMAL HIGH (ref 70–99)
Glucose-Capillary: 176 mg/dL — ABNORMAL HIGH (ref 70–99)

## 2019-08-31 MED ORDER — OXYCODONE-ACETAMINOPHEN 5-325 MG PO TABS: 1.0000 | ORAL_TABLET | ORAL | 0 refills | Status: DC | PRN

## 2019-08-31 NOTE — TOC Progression Note (Addendum)
Transition of Care [facility]) - Progression Note    Patient Details  Name: Shannon Schultz MRN: [id number] Date of Birth: 1946/02/10  [facility] CM/SW Contact  Leeroy Cha, RN Phone Number: 08/31/2019, 10:29 AM  Clinical Narrative:    [name] TRANSFER FORM PRINTED AND GIVEBN TO FLOOR RN. Patient to go to room 104P at [facility] ptar called for transport at 1305 Prescription for percocet faxed to [place].  Fax successful and Irine Seal called to verify. Prescription then torn and placed in sherrer box.  Wintessed by Lennart Pall, lcsw. Expected Discharge Plan: Skilled Nursing Facility Barriers to Discharge: Ship broker  Expected Discharge Plan and Services Expected Discharge Plan: [place]   Discharge Planning Services: CM Consult Post Acute Care Choice: [place] Living arrangements for the past 2 months: Single Family Home                                       Social Determinants of Health (SDOH) Interventions    Readmission Risk Interventions No flowsheet data found.

## 2019-08-31 NOTE — Discharge Summary (Signed)
Physician Discharge Summary  Shannon Schultz [id number] DOB: 1946-04-02 DOA: 08/23/2019  PCP: [name], MD  Admit date: 08/23/2019 Discharge date: 08/31/2019  Admitted From: home Disposition:  SNF  Recommendations for Outpatient Follow-up:  1. Follow up with orthopedic surgery in 2 weeks  Home Health: none Equipment/Devices: none  Discharge Condition: stable CODE STATUS: Full code Diet recommendation: regular  HPI: Per admitting MD, Shannon Schultz is a 74 y.o. female with medical history significant of Covid infection in November COPD with chronic hypoxia, venous insufficiency, HTN, HLD, aortic valve stenosis, cirrhosis of the liver, thrombocytopenia, bladder cancer, diabetes type 2 Presented with fall happened today slipped in the kitchen landed on her right hip and right shoulder on the floor noted to have significant rotation of her right leg and deformity to right shoulder.  Prior history of rotator cuff injury.  EMS was called noted to be satting 94% on room air fentanyl given Patient thinks it was secondary to tripping no head injury or loss of consciousness.  She is not on any blood thinners.  She has extensive medical problems.  No headache no neck stiffness She was not found for 1 hour, until her husband came home She only uses oxygen rarely Received her [employer] immunization for Covid on 20 February 21 She had no side-effects She can walk a flight of stair nd to her mail box without significant shortness of breath or chest pain   Hospital Course / Discharge diagnoses: Closed right hip fracture -orthopedic surgery consult.  Follow patient while hospitalized, he status post intramedullary nail femur placement by Dr. Lyla Glassing on 08/24/2019.  She has adequate pain control, she is receiving Lovenox for DVT prophylaxis for the next 30 days, and will be discharged to SNF in stable condition.  Initial recommendation was for CIR however insurance declined due to nonqualifying  diagnosis  Right shoulder dislocation -SP closed reduction.  Continue shoulder immobilizer Prior Covid infection -Patient has completed her Covid treatment.  She has no acute symptoms, and on retesting was negative.  Received first dose of Covid vaccine on February 20, please ensure she gets the second dose while in SNF Moderate aortic stenosis. Essential hypertension. Sinus tachycardia -Continue beta-blocker, no active issues History of COPD Chronic respiratory failure -Currently stable. Type 2 diabetes mellitus.  Controlled without any complication. -Continue home medications, diabetic diet Prior history of breast cancer as well as bladder cancer -Currently stable. Obesity - Body mass index is 36.22 kg/m.    Discharge Instructions   Allergies as of 08/31/2019      Reactions   Contrast Media [iodinated Diagnostic Agents] Anaphylaxis, Shortness Of Breath, Other (See Comments)   Could not breath   Iohexol Anaphylaxis, Shortness Of Breath, Other (See Comments)   Immediately could not breathe   Lisinopril Anaphylaxis, Shortness Of Breath, Rash   Sulfa Antibiotics Anaphylaxis, Other (See Comments)   Historical from mother, pt states that mother says she almost died from this drug   Latex Other (See Comments)   Unless against on skin for a long time, blisters. Short term is okay.   Codeine Nausea Only, Anxiety, Other (See Comments)   insomnia   Levofloxacin Other (See Comments)   insomnia   Lipitor [atorvastatin Calcium] Rash   Vicodin [hydrocodone-acetaminophen] Itching      Medication List    TAKE these medications   Accu-Chek Aviva Plus test strip Generic drug: glucose blood TEST BLOOD SUGAR ONE TO TWO TIMES DAILY   acetaminophen 500 MG tablet Commonly  known as: TYLENOL Take 1,000 mg every 6 (six) hours as needed by mouth for moderate pain or headache.   albuterol 108 (90 Base) MCG/ACT inhaler Commonly known as: VENTOLIN HFA Inhale 2 puffs into the lungs every 6  (six) hours as needed for wheezing or shortness of breath.   albuterol (2.5 MG/3ML) 0.083% nebulizer solution Commonly known as: PROVENTIL USE 1 VIAL BY NEBULIZATION EVERY FOUR HOURS AS NEEDED FOR WHEEZING OR SHORTNESS OF BREATH.   Anoro Ellipta 62.5-25 MCG/INH Aepb Generic drug: umeclidinium-vilanterol Inhale 1 puff into the lungs daily.   aspirin EC 81 MG tablet Take 81 mg daily by mouth.   bisoprolol-hydrochlorothiazide 10-6.25 MG tablet Commonly known as: ZIAC TAKE 1 TABLET EVERY DAY   CALCIUM-MAGNESIUM-VITAMIN D PO Take 1 tablet by mouth in the morning and at bedtime.   enoxaparin 40 MG/0.4ML injection Commonly known as: LOVENOX Inject 0.4 mLs (40 mg total) into the skin daily.   fluticasone 50 MCG/ACT nasal spray Commonly known as: FLONASE USE 2 SPRAYS IN EACH NOSTRIL EVERY DAY   furosemide 20 MG tablet Commonly known as: LASIX TAKE 1 TABLET (20 MG TOTAL) BY MOUTH DAILY AS NEEDED FOR FLUID OR EDEMA.   KLS Aller-Tec 10 MG tablet Generic drug: cetirizine Take 10 mg by mouth daily.   metFORMIN 500 MG 24 hr tablet Commonly known as: GLUCOPHAGE-XR Take 1 tablet (500 mg total) by mouth 3 (three) times daily.   neomycin-polymyxin b-dexamethasone 3.5-10000-0.1 Oint Commonly known as: MAXITROL APPLY TO RIGHT EYE BID UTD   oxyCODONE-acetaminophen 5-325 MG tablet Commonly known as: PERCOCET/ROXICET Take 1 tablet by mouth every 4 (four) hours as needed for moderate pain.   simvastatin 20 MG tablet Commonly known as: ZOCOR Take 1 tablet (20 mg total) by mouth at bedtime.   triamcinolone cream 0.1 % Commonly known as: KENALOG Apply 1 application topically 2 (two) times daily as needed (redness).   TRUEplus Lancets 30G Misc TEST BLOOD SUGAR ONE TO TWO TIMES DAILY      Follow-up Information    Swinteck, Aaron Edelman, MD. Schedule an appointment as soon as possible for a visit in 2 weeks.   Specialty: Orthopedic Surgery Why: For wound re-check Contact information: [address] [place] [address] [place] [address] [id number]        Nicholes Stairs, MD. Schedule an appointment as soon as possible for a visit in 2 weeks.   Specialty: Orthopedic Surgery Why: right shoulder x-rays Contact information: [address] [place] [address] [id number]           Consultations:  Orthopedic surgery   Procedures/Studies:  IM Nail  DG Shoulder Right  Result Date: 08/23/2019 CLINICAL DATA:  Fall EXAM: RIGHT SHOULDER - 2+ VIEW COMPARISON:  None. FINDINGS: Anterior shoulder dislocation. Probable fracture of the greater tuberosity. AC joint normal alignment IMPRESSION: Anterior shoulder dislocation. Anterior dislocation of shoulder. Probable fracture of the greater tuberosity. Electronically Signed   By: Franchot Gallo M.D.   On: 08/23/2019 19:37   CT Shoulder Right Wo Contrast  Result Date: 08/23/2019 CLINICAL DATA:  74 year old female status post reduction of right shoulder dislocation. EXAM: CT OF THE UPPER RIGHT EXTREMITY WITHOUT CONTRAST TECHNIQUE: Multidetector CT imaging of the upper right extremity was performed according to the standard protocol. COMPARISON:  Earlier radiograph dated 08/23/2019. FINDINGS: Bones/Joint/Cartilage Evaluation is limited due to osteopenia. There is a poorly visualized nondisplaced fracture of the greater tubercle of the humerus (series 5 image 42 and series 6, image 56). Fracture of  the superolateral humeral head (series 3 image 112 and series 10 image 41) most consistent with a Hill-Sachs injury. There is probable nondisplaced cortical fracture of the greater tuberosity of the humerus (series 10, image 35-32). There is no dislocation. The bones are osteopenic. No significant joint effusion. Ligaments Suboptimally assessed by CT. Muscles and Tendons There is edema and stranding of the soft tissues of the right axilla. No large fluid collection or hematoma. Soft tissues Soft tissue edema in the right  axilla. IMPRESSION: Nondisplaced fractures of the superolateral humeral head with involvement of the greater tubercle. No dislocation. Electronically Signed   By: Anner Crete M.D.   On: 08/23/2019 22:05   Pelvis Portable  Result Date: 08/24/2019 CLINICAL DATA:  Status post open reduction and internal fixation of intertrochanteric fracture of the proximal right femur. EXAM: PORTABLE PELVIS 1-2 VIEWS COMPARISON:  Radiographs dated 08/23/2019 FINDINGS: Intramedullary nail and lag screw appear in good position. Improved alignment and position of the intertrochanteric fracture of the proximal right femur. The superior aspect of the greater trochanter is avulsed and slightly displaced. IMPRESSION: Satisfactory appearance of the right hip after open reduction and internal fixation of intertrochanteric fracture. Electronically Signed   By: Lorriane Shire M.D.   On: 08/24/2019 15:30   DG CHEST PORT 1 VIEW  Result Date: 08/23/2019 CLINICAL DATA:  Right shoulder fracture dislocation after fall, preoperative evaluation EXAM: PORTABLE CHEST 1 VIEW COMPARISON:  09/29/2017 FINDINGS: Single frontal view of the chest demonstrates an unremarkable cardiac silhouette. No airspace disease, effusion, or pneumothorax. No acute displaced fracture. IMPRESSION: 1. No acute intrathoracic process. Electronically Signed   By: Randa Ngo M.D.   On: 08/23/2019 23:26   DG Shoulder Right Port  Result Date: 08/24/2019 CLINICAL DATA:  Shoulder pain. Recent anterior dislocation and reduction. EXAM: PORTABLE RIGHT SHOULDER COMPARISON:  Radiographs dated 08/23/2019 and CT scan dated 08/23/2019 FINDINGS: A single AP portable view of the right shoulder in internal rotation does demonstrate a small avulsion from the posterior aspect of the right humeral head as demonstrated on the prior CT scan. No apparent dislocation. No change since the prior CT scan. IMPRESSION: Small avulsion from the posterior aspect of the right humeral head. No  change since the prior CT scan. Electronically Signed   By: Lorriane Shire M.D.   On: 08/24/2019 15:27   DG Shoulder Right Portable  Result Date: 08/23/2019 CLINICAL DATA:  Reduction of prior dislocation EXAM: PORTABLE RIGHT SHOULDER COMPARISON:  08/23/2019 FINDINGS: Single frontal view of the right shoulder was obtained, demonstrating reduction of the prior glenohumeral dislocation. Fracture of the superolateral aspect of the right humeral head is noted. Right chest is clear. IMPRESSION: 1. Reduction of prior dislocation. 2. Minimally displaced fracture superolateral aspect right humeral head. Electronically Signed   By: Randa Ngo M.D.   On: 08/23/2019 22:18   DG C-Arm 1-60 Min-No Report  Result Date: 08/24/2019 Fluoroscopy was utilized by the requesting physician.  No radiographic interpretation.   DG HIP OPERATIVE UNILAT W OR W/O PELVIS RIGHT  Result Date: 08/24/2019 CLINICAL DATA:  Intertrochanteric fracture of the proximal right femur. Status post open reduction and internal fixation. EXAM: OPERATIVE RIGHT HIP WITH PELVIS; DG C-ARM 1-60 MIN-NO REPORT COMPARISON:  Radiographs dated 08/23/2019 FINDINGS: AP and lateral C-arm images demonstrate the patient has undergone insertion of an intramedullary nail and lag screw. Alignment and position of the major fracture fragments is markedly improved and near anatomic. The superior aspect of the greater trochanter is avulsed and displaced.  Slight displacement of the lesser tuberosity. IMPRESSION: Open reduction and internal fixation of intertrochanteric fracture of the proximal right femur as described. FLUOROSCOPY TIME:  1 minutes 12 seconds. C-arm fluoroscopic images were obtained intraoperatively and submitted for post operative interpretation. Electronically Signed   By: Lorriane Shire M.D.   On: 08/24/2019 15:29   DG Hip Unilat With Pelvis 2-3 Views Right  Result Date: 08/23/2019 CLINICAL DATA:  Fall EXAM: DG HIP (WITH OR WITHOUT PELVIS) 2-3V  RIGHT COMPARISON:  None. FINDINGS: Intertrochanteric fracture right femur. Avulsion lesser trochanter. Mild joint space narrowing of both hips. No other pelvic fracture identified. IMPRESSION: Right intertrochanteric fracture. Electronically Signed   By: Franchot Gallo M.D.   On: 08/23/2019 19:38     Subjective: - no chest pain, shortness of breath, no abdominal pain, nausea or vomiting.   Discharge Exam: BP 113/71 (BP Location: Left Arm)   Pulse 90   Temp 98.1 F (36.7 C) (Oral)   Resp 17   Ht 5\' 4"  (1.626 m)   Wt 95.7 kg   LMP  (LMP Unknown)   SpO2 94% Comment: decreased to 85% on room air  BMI 36.22 kg/m   General: Pt is alert, awake, not in acute distress Cardiovascular: RRR, 3/6 SEM Respiratory: CTA bilaterally, no wheezing, no rhonchi Abdominal: Soft, NT, ND, bowel sounds + Extremities: no edema, no cyanosis  The results of significant diagnostics from this hospitalization (including imaging, microbiology, ancillary and laboratory) are listed below for reference.     Microbiology: Recent Results (from the past 240 hour(s))  SARS CORONAVIRUS 2 (TAT 6-24 HRS) Nasopharyngeal Nasopharyngeal Swab     Status: Abnormal   Collection Time: 08/23/19  8:56 PM   Specimen: Nasopharyngeal Swab  Result Value Ref Range Status   SARS Coronavirus 2 POSITIVE (A) NEGATIVE Final    Comment: RESULT CALLED TO, READ BACK BY AND VERIFIED WITH: C. BLUM, RN ([facility]) AT 0825 ON 08/24/19 BY C. JESSUP, MT. (NOTE) SARS-CoV-2 target nucleic acids are DETECTED. The SARS-CoV-2 RNA is generally detectable in upper and lower respiratory specimens during the acute phase of infection. Positive results are indicative of the presence of SARS-CoV-2 RNA. Clinical correlation with patient history and other diagnostic information is  necessary to determine patient infection status. Positive results do not rule out bacterial infection or co-infection with other viruses.  The expected result is Negative. Fact  Sheet for Patients: SugarRoll.be Fact Sheet for Healthcare Providers: https://www.woods-mathews.com/ This test is not yet approved or cleared by the [place] FDA and  has been authorized for detection and/or diagnosis of SARS-CoV-2 by FDA under an Emergency Use Authorization (EUA). This EUA will remain  in effect (meaning this test ca n be used) for the duration of the COVID-19 declaration under Section 564(b)(1) of the Act, 21 U.S.C. section 360bbb-3(b)(1), unless the authorization is terminated or revoked sooner. Performed at [facility], [place] [address] [place], [place] [address]   Surgical PCR screen     Status: None   Collection Time: 08/24/19  5:55 AM   Specimen: Nasal Mucosa; Nasal Swab  Result Value Ref Range Status   MRSA, PCR NEGATIVE NEGATIVE Final   Staphylococcus aureus NEGATIVE NEGATIVE Final    Comment: (NOTE) The Xpert SA Assay (FDA approved for NASAL specimens in patients 54 years of age and older), is one component of a comprehensive surveillance program. It is not intended to diagnose infection nor to guide or monitor treatment. Performed at [facility], [place]  [address] [place], [place] [address]   SARS CORONAVIRUS 2 (TAT 6-24 HRS) Nasopharyngeal Nasopharyngeal Swab     Status: None   Collection Time: 08/29/19  5:17 PM   Specimen: Nasopharyngeal Swab  Result Value Ref Range Status   SARS Coronavirus 2 NEGATIVE NEGATIVE Final    Comment: (NOTE) SARS-CoV-2 target nucleic acids are NOT DETECTED. The SARS-CoV-2 RNA is generally detectable in upper and lower respiratory specimens during the acute phase of infection. Negative results do not preclude SARS-CoV-2 infection, do not rule out co-infections with other pathogens, and should not be used as the sole basis for treatment or other patient management decisions. Negative results must be combined with clinical observations, patient history,  and epidemiological information. The expected result is Negative. Fact Sheet for Patients: SugarRoll.be Fact Sheet for Healthcare Providers: https://www.woods-mathews.com/ This test is not yet approved or cleared by the [place] FDA and  has been authorized for detection and/or diagnosis of SARS-CoV-2 by FDA under an Emergency Use Authorization (EUA). This EUA will remain  in effect (meaning this test can be used) for the duration of the COVID-19 declaration under Section 56 4(b)(1) of the Act, 21 U.S.C. section 360bbb-3(b)(1), unless the authorization is terminated or revoked sooner. Performed at [facility], [place] [address] [place], [place] [address]      Labs: Basic Metabolic Panel: Recent Labs  Lab 08/25/19 0309 08/26/19 0400 08/28/19 0519 08/29/19 0438 08/30/19 0351  NA 135 136 137 138 138  K 4.3 3.9 3.9 4.1 4.1  CL 102 99 102 101 103  CO2 25 29 27 29 27   GLUCOSE 161* 127* 147* 134* 125*  BUN 15 17 16 15 16   CREATININE 0.56 0.51 0.44 0.49 0.48  CALCIUM 8.6* 8.5* 8.4* 8.6* 8.5*  PHOS  --   --  3.3 3.6 2.9   Liver Function Tests: Recent Labs  Lab 08/28/19 0519 08/29/19 0438 08/30/19 0351  ALBUMIN 2.8* 2.8* 2.7*   CBC: Recent Labs  Lab 08/27/19 0344 08/28/19 0519 08/28/19 0841 08/29/19 0438 08/30/19 0351  WBC 5.5 4.5 5.1 4.1 4.2  NEUTROABS  --  3.0  --  2.5 2.6  HGB 8.3* 7.8* 8.6* 8.6* 8.6*  HCT 25.2* 24.7* 26.5* 27.0* 28.0*  MCV 91.0 92.9 92.7 93.1 93.6  PLT 124* 129* 124* 133* 140*   CBG: Recent Labs  Lab 08/30/19 0733 08/30/19 1143 08/30/19 1650 08/30/19 2120 08/31/19 0729  GLUCAP 129* 162* 153* 114* 115*   Hgb A1c No results for input(s): HGBA1C in the last 72 hours. Lipid Profile No results for input(s): CHOL, HDL, LDLCALC, TRIG, CHOLHDL, LDLDIRECT in the last 72 hours. Thyroid function studies No results for input(s): TSH, T4TOTAL, T3FREE, THYROIDAB in the last 72 hours.  Invalid  input(s): FREET3 Urinalysis    Component Value Date/Time   COLORURINE YELLOW 09/29/2017 [place] 09/29/2017 1403   LABSPEC 1.025 03/15/2018 1404   PHURINE 5.0 09/29/2017 1403   GLUCOSEU NEGATIVE 09/29/2017 1403   HGBUR NEGATIVE 09/29/2017 1403   BILIRUBINUR negative 03/15/2018 1404   BILIRUBINUR neg 10/08/2015 1355   KETONESUR negative 03/15/2018 1404   KETONESUR NEGATIVE 09/29/2017 1403   PROTEINUR negative 03/15/2018 1404   PROTEINUR 30 (A) 09/29/2017 1403   UROBILINOGEN negative 10/08/2015 1355   UROBILINOGEN 1.0 06/06/2011 1733   NITRITE Negative 03/15/2018 1404   NITRITE NEGATIVE 09/29/2017 1403   LEUKOCYTESUR Negative 03/15/2018 1404    FURTHER DISCHARGE INSTRUCTIONS:   Get Medicines reviewed and adjusted: Please take all your  medications with you for your next visit with your Primary MD   Laboratory/radiological data: Please request your Primary MD to go over all hospital tests and procedure/radiological results at the follow up, please ask your Primary MD to get all Hospital records sent to his/her office.   In some cases, they will be blood work, cultures and biopsy results pending at the time of your discharge. Please request that your primary care M.D. goes through all the records of your hospital data and follows up on these results.   Also Note the following: If you experience worsening of your admission symptoms, develop shortness of breath, life threatening emergency, suicidal or homicidal thoughts you must seek medical attention immediately by calling 911 or calling your MD immediately  if symptoms less severe.   You must read complete instructions/literature along with all the possible adverse reactions/side effects for all the Medicines you take and that have been prescribed to you. Take any new Medicines after you have completely understood and accpet all the possible adverse reactions/side effects.    Do not drive when taking Pain medications  or sleeping medications (Benzodaizepines)   Do not take more than prescribed Pain, Sleep and Anxiety Medications. It is not advisable to combine anxiety,sleep and pain medications without talking with your primary care practitioner   Special Instructions: If you have smoked or chewed Tobacco  in the last 2 yrs please stop smoking, stop any regular Alcohol  and or any Recreational drug use.   Wear Seat belts while driving.   Please note: You were cared for by a hospitalist during your hospital stay. Once you are discharged, your primary care physician will handle any further medical issues. Please note that NO REFILLS for any discharge medications will be authorized once you are discharged, as it is imperative that you return to your primary care physician (or establish a relationship with a primary care physician if you do not have one) for your post hospital discharge needs so that they can reassess your need for medications and monitor your lab values.  Time coordinating discharge: 40 minutes  SIGNED:  Marzetta Board, MD, PhD 08/31/2019, 10:21 AM

## 2019-08-31 NOTE — Progress Notes (Addendum)
Attempted to call [facility] to give report. No one answered the phone for 5 minutes. Will try again shortly.   Report given to Vaughan Basta, the receiving nurse at [facility]. After giving report, Vaughan Basta denied any further questions. Case manager notified to call [facility].

## 2019-08-31 NOTE — Progress Notes (Signed)
Inpatient Rehabilitation-Admissions Coordinator   Pt's insurance company requested peer to peer prior to making a determination for CIR. Dr. Cruzita Lederer agreed to complete peer to peer. Unfortunately, despite completion of peer to peer conference pt's insurance company has denied the request for CIR. MD aware. I notified the patient of the insurance determination. TOC team notified. AC will sign off.   Raechel Ache, OTR/L  Rehab Admissions Coordinator  [phone] 08/31/2019 10:38 AM

## 2019-09-01 DIAGNOSIS — I350 Nonrheumatic aortic (valve) stenosis: Secondary | ICD-10-CM | POA: Diagnosis not present

## 2019-09-01 DIAGNOSIS — R42 Dizziness and giddiness: Secondary | ICD-10-CM | POA: Diagnosis not present

## 2019-09-01 DIAGNOSIS — E1169 Type 2 diabetes mellitus with other specified complication: Secondary | ICD-10-CM | POA: Diagnosis not present

## 2019-09-01 DIAGNOSIS — G4733 Obstructive sleep apnea (adult) (pediatric): Secondary | ICD-10-CM | POA: Diagnosis not present

## 2019-09-01 DIAGNOSIS — J984 Other disorders of lung: Secondary | ICD-10-CM | POA: Diagnosis not present

## 2019-09-01 DIAGNOSIS — S72001A Fracture of unspecified part of neck of right femur, initial encounter for closed fracture: Secondary | ICD-10-CM | POA: Diagnosis not present

## 2019-09-01 DIAGNOSIS — E668 Other obesity: Secondary | ICD-10-CM | POA: Diagnosis not present

## 2019-09-01 DIAGNOSIS — I471 Supraventricular tachycardia: Secondary | ICD-10-CM | POA: Diagnosis not present

## 2019-09-01 DIAGNOSIS — U071 COVID-19: Secondary | ICD-10-CM | POA: Diagnosis not present

## 2019-09-06 DIAGNOSIS — I471 Supraventricular tachycardia: Secondary | ICD-10-CM | POA: Diagnosis not present

## 2019-09-06 DIAGNOSIS — J984 Other disorders of lung: Secondary | ICD-10-CM | POA: Diagnosis not present

## 2019-09-06 DIAGNOSIS — S72001A Fracture of unspecified part of neck of right femur, initial encounter for closed fracture: Secondary | ICD-10-CM | POA: Diagnosis not present

## 2019-09-06 DIAGNOSIS — E1169 Type 2 diabetes mellitus with other specified complication: Secondary | ICD-10-CM | POA: Diagnosis not present

## 2019-09-06 DIAGNOSIS — S43004A Unspecified dislocation of right shoulder joint, initial encounter: Secondary | ICD-10-CM | POA: Diagnosis not present

## 2019-09-12 DIAGNOSIS — S72141D Displaced intertrochanteric fracture of right femur, subsequent encounter for closed fracture with routine healing: Secondary | ICD-10-CM | POA: Diagnosis not present

## 2019-09-12 DIAGNOSIS — M25511 Pain in right shoulder: Secondary | ICD-10-CM | POA: Diagnosis not present

## 2019-09-13 ENCOUNTER — Ambulatory Visit: Payer: Medicare HMO | Attending: Internal Medicine

## 2019-09-13 DIAGNOSIS — Z23 Encounter for immunization: Secondary | ICD-10-CM

## 2019-09-13 NOTE — Progress Notes (Signed)
   Covid-19 Vaccination Clinic  Name:  Shannon Schultz    MRN: [id number] DOB: 09-26-1945  09/13/2019  Ms. Kleban was observed post Covid-19 immunization for 30 minutes based on pre-vaccination screening without incident. She was provided with Vaccine Information Sheet and instruction to access the V-Safe system.   Ms. Kneale was instructed to call 911 with any severe reactions post vaccine: Marland Kitchen Difficulty breathing  . Swelling of face and throat  . A fast heartbeat  . A bad rash all over body  . Dizziness and weakness   Immunizations Administered    Name Date Dose VIS Date Route   Pfizer COVID-19 Vaccine 09/13/2019  2:56 PM 0.3 mL 06/10/2019 Intramuscular   Manufacturer: [place]   Lot: [id number]   [place]: [id number]

## 2019-09-15 DIAGNOSIS — S72001A Fracture of unspecified part of neck of right femur, initial encounter for closed fracture: Secondary | ICD-10-CM | POA: Diagnosis not present

## 2019-09-15 DIAGNOSIS — G8918 Other acute postprocedural pain: Secondary | ICD-10-CM | POA: Diagnosis not present

## 2019-09-19 DIAGNOSIS — B3781 Candidal esophagitis: Secondary | ICD-10-CM | POA: Diagnosis not present

## 2019-09-19 DIAGNOSIS — B370 Candidal stomatitis: Secondary | ICD-10-CM | POA: Diagnosis not present

## 2019-09-19 DIAGNOSIS — R42 Dizziness and giddiness: Secondary | ICD-10-CM | POA: Diagnosis not present

## 2019-09-19 DIAGNOSIS — E1169 Type 2 diabetes mellitus with other specified complication: Secondary | ICD-10-CM | POA: Diagnosis not present

## 2019-09-20 DIAGNOSIS — R262 Difficulty in walking, not elsewhere classified: Secondary | ICD-10-CM | POA: Diagnosis not present

## 2019-09-20 DIAGNOSIS — M6281 Muscle weakness (generalized): Secondary | ICD-10-CM | POA: Diagnosis not present

## 2019-09-20 DIAGNOSIS — J449 Chronic obstructive pulmonary disease, unspecified: Secondary | ICD-10-CM | POA: Diagnosis not present

## 2019-09-20 DIAGNOSIS — J984 Other disorders of lung: Secondary | ICD-10-CM | POA: Diagnosis not present

## 2019-09-20 DIAGNOSIS — S72141S Displaced intertrochanteric fracture of right femur, sequela: Secondary | ICD-10-CM | POA: Diagnosis not present

## 2019-09-20 DIAGNOSIS — I471 Supraventricular tachycardia: Secondary | ICD-10-CM | POA: Diagnosis not present

## 2019-09-20 DIAGNOSIS — S72001A Fracture of unspecified part of neck of right femur, initial encounter for closed fracture: Secondary | ICD-10-CM | POA: Diagnosis not present

## 2019-09-20 DIAGNOSIS — S43004A Unspecified dislocation of right shoulder joint, initial encounter: Secondary | ICD-10-CM | POA: Diagnosis not present

## 2019-09-20 DIAGNOSIS — E1169 Type 2 diabetes mellitus with other specified complication: Secondary | ICD-10-CM | POA: Diagnosis not present

## 2019-09-22 ENCOUNTER — Other Ambulatory Visit: Payer: Self-pay

## 2019-09-22 NOTE — Patient Outreach (Addendum)
[facility]) Care Management  09/22/2019  Shannon Schultz 1945/12/29 [id number]     Transition of Care Referral  Referral Date: 09/22/2019 Referral Source: [facility] Discharge Report Date of Discharge: 09/20/2019 Facility: [place]: [facility]   Referral received. Transition of care calls being completed via EMMI-automated calls. RN CM will outreach patient for any red flags received.     Plan: RN CM will close case at this time.   Enzo Montgomery, RN,BSN,CCM [facility] Management Telephonic Care Management Coordinator Direct Phone: [phone] Toll Free: [phone] Fax: [phone]

## 2019-09-23 DIAGNOSIS — J449 Chronic obstructive pulmonary disease, unspecified: Secondary | ICD-10-CM | POA: Diagnosis not present

## 2019-09-23 DIAGNOSIS — E1151 Type 2 diabetes mellitus with diabetic peripheral angiopathy without gangrene: Secondary | ICD-10-CM | POA: Diagnosis not present

## 2019-09-23 DIAGNOSIS — I10 Essential (primary) hypertension: Secondary | ICD-10-CM | POA: Diagnosis not present

## 2019-09-23 DIAGNOSIS — S42251D Displaced fracture of greater tuberosity of right humerus, subsequent encounter for fracture with routine healing: Secondary | ICD-10-CM | POA: Diagnosis not present

## 2019-09-23 DIAGNOSIS — I872 Venous insufficiency (chronic) (peripheral): Secondary | ICD-10-CM | POA: Diagnosis not present

## 2019-09-23 DIAGNOSIS — J9611 Chronic respiratory failure with hypoxia: Secondary | ICD-10-CM | POA: Diagnosis not present

## 2019-09-23 DIAGNOSIS — I2510 Atherosclerotic heart disease of native coronary artery without angina pectoris: Secondary | ICD-10-CM | POA: Diagnosis not present

## 2019-09-23 DIAGNOSIS — S72141D Displaced intertrochanteric fracture of right femur, subsequent encounter for closed fracture with routine healing: Secondary | ICD-10-CM | POA: Diagnosis not present

## 2019-09-23 DIAGNOSIS — I350 Nonrheumatic aortic (valve) stenosis: Secondary | ICD-10-CM | POA: Diagnosis not present

## 2019-09-27 DIAGNOSIS — I350 Nonrheumatic aortic (valve) stenosis: Secondary | ICD-10-CM | POA: Diagnosis not present

## 2019-09-27 DIAGNOSIS — I2510 Atherosclerotic heart disease of native coronary artery without angina pectoris: Secondary | ICD-10-CM | POA: Diagnosis not present

## 2019-09-27 DIAGNOSIS — I10 Essential (primary) hypertension: Secondary | ICD-10-CM | POA: Diagnosis not present

## 2019-09-27 DIAGNOSIS — J9611 Chronic respiratory failure with hypoxia: Secondary | ICD-10-CM | POA: Diagnosis not present

## 2019-09-27 DIAGNOSIS — I872 Venous insufficiency (chronic) (peripheral): Secondary | ICD-10-CM | POA: Diagnosis not present

## 2019-09-27 DIAGNOSIS — S72141D Displaced intertrochanteric fracture of right femur, subsequent encounter for closed fracture with routine healing: Secondary | ICD-10-CM | POA: Diagnosis not present

## 2019-09-27 DIAGNOSIS — S42251D Displaced fracture of greater tuberosity of right humerus, subsequent encounter for fracture with routine healing: Secondary | ICD-10-CM | POA: Diagnosis not present

## 2019-09-27 DIAGNOSIS — E1151 Type 2 diabetes mellitus with diabetic peripheral angiopathy without gangrene: Secondary | ICD-10-CM | POA: Diagnosis not present

## 2019-09-27 DIAGNOSIS — J449 Chronic obstructive pulmonary disease, unspecified: Secondary | ICD-10-CM | POA: Diagnosis not present

## 2019-09-29 ENCOUNTER — Telehealth: Payer: Self-pay | Admitting: Family Medicine

## 2019-09-29 DIAGNOSIS — J449 Chronic obstructive pulmonary disease, unspecified: Secondary | ICD-10-CM | POA: Diagnosis not present

## 2019-09-29 DIAGNOSIS — S72141D Displaced intertrochanteric fracture of right femur, subsequent encounter for closed fracture with routine healing: Secondary | ICD-10-CM | POA: Diagnosis not present

## 2019-09-29 DIAGNOSIS — I2510 Atherosclerotic heart disease of native coronary artery without angina pectoris: Secondary | ICD-10-CM | POA: Diagnosis not present

## 2019-09-29 DIAGNOSIS — S42251D Displaced fracture of greater tuberosity of right humerus, subsequent encounter for fracture with routine healing: Secondary | ICD-10-CM | POA: Diagnosis not present

## 2019-09-29 DIAGNOSIS — I10 Essential (primary) hypertension: Secondary | ICD-10-CM | POA: Diagnosis not present

## 2019-09-29 DIAGNOSIS — J9611 Chronic respiratory failure with hypoxia: Secondary | ICD-10-CM | POA: Diagnosis not present

## 2019-09-29 DIAGNOSIS — I350 Nonrheumatic aortic (valve) stenosis: Secondary | ICD-10-CM | POA: Diagnosis not present

## 2019-09-29 DIAGNOSIS — E1151 Type 2 diabetes mellitus with diabetic peripheral angiopathy without gangrene: Secondary | ICD-10-CM | POA: Diagnosis not present

## 2019-09-29 DIAGNOSIS — I872 Venous insufficiency (chronic) (peripheral): Secondary | ICD-10-CM | POA: Diagnosis not present

## 2019-09-29 NOTE — Telephone Encounter (Signed)
Noted  

## 2019-09-29 NOTE — Telephone Encounter (Signed)
Received a call from [place] with [facility]. She states she went to see pt today and she was doing well. Per orders pt is to be seen twice this week. Pt declined to be seen tomorrow. Pt is scheduled to be seen on 10/05/2019 and if continues to do well will be discharged. Claiborne Billings can be reached at [phone].

## 2019-10-03 ENCOUNTER — Other Ambulatory Visit: Payer: Medicare HMO

## 2019-10-03 ENCOUNTER — Other Ambulatory Visit: Payer: Self-pay

## 2019-10-03 DIAGNOSIS — K7460 Unspecified cirrhosis of liver: Secondary | ICD-10-CM

## 2019-10-03 DIAGNOSIS — E7800 Pure hypercholesterolemia, unspecified: Secondary | ICD-10-CM

## 2019-10-03 DIAGNOSIS — D696 Thrombocytopenia, unspecified: Secondary | ICD-10-CM

## 2019-10-03 DIAGNOSIS — Z5181 Encounter for therapeutic drug level monitoring: Secondary | ICD-10-CM | POA: Diagnosis not present

## 2019-10-03 DIAGNOSIS — E118 Type 2 diabetes mellitus with unspecified complications: Secondary | ICD-10-CM | POA: Diagnosis not present

## 2019-10-04 ENCOUNTER — Encounter: Payer: Self-pay | Admitting: Pulmonary Disease

## 2019-10-04 ENCOUNTER — Ambulatory Visit: Payer: Medicare HMO | Admitting: Pulmonary Disease

## 2019-10-04 VITALS — BP 140/66 | HR 86 | Temp 98.1°F | Ht 64.0 in | Wt 204.0 lb

## 2019-10-04 DIAGNOSIS — J449 Chronic obstructive pulmonary disease, unspecified: Secondary | ICD-10-CM

## 2019-10-04 DIAGNOSIS — G4734 Idiopathic sleep related nonobstructive alveolar hypoventilation: Secondary | ICD-10-CM

## 2019-10-04 LAB — LIPID PANEL
Chol/HDL Ratio: 2.1 ratio (ref 0.0–4.4)
Cholesterol, Total: 116 mg/dL (ref 100–199)
HDL: 54 mg/dL (ref >39)
LDL Chol Calc (NIH): 43 mg/dL (ref 0–99)
Triglycerides: 101 mg/dL (ref 0–149)
VLDL Cholesterol Cal: 19 mg/dL (ref 5–40)

## 2019-10-04 LAB — COMPREHENSIVE METABOLIC PANEL
ALT: 11 IU/L (ref 0–32)
AST: 23 IU/L (ref 0–40)
Albumin/Globulin Ratio: 1.8 (ref 1.2–2.2)
Albumin: 4.2 g/dL (ref 3.7–4.7)
Alkaline Phosphatase: 95 IU/L (ref 39–117)
BUN/Creatinine Ratio: 28 (ref 12–28)
BUN: 16 mg/dL (ref 8–27)
Bilirubin Total: 0.7 mg/dL (ref 0.0–1.2)
CO2: 25 mmol/L (ref 20–29)
Calcium: 9.8 mg/dL (ref 8.7–10.3)
Chloride: 103 mmol/L (ref 96–106)
Creatinine, Ser: 0.58 mg/dL (ref 0.57–1.00)
GFR calc Af Amer: 106 mL/min/{1.73_m2} (ref >59)
GFR calc non Af Amer: 92 mL/min/{1.73_m2} (ref >59)
Globulin, Total: 2.3 g/dL (ref 1.5–4.5)
Glucose: 115 mg/dL — ABNORMAL HIGH (ref 65–99)
Potassium: 4 mmol/L (ref 3.5–5.2)
Sodium: 142 mmol/L (ref 134–144)
Total Protein: 6.5 g/dL (ref 6.0–8.5)

## 2019-10-04 LAB — CBC WITH DIFFERENTIAL/PLATELET
Basophils Absolute: 0 10*3/uL (ref 0.0–0.2)
Basos: 1 %
EOS (ABSOLUTE): 0.1 10*3/uL (ref 0.0–0.4)
Eos: 2 %
Hematocrit: 38.5 % (ref 34.0–46.6)
Hemoglobin: 12.2 g/dL (ref 11.1–15.9)
Immature Grans (Abs): 0 10*3/uL (ref 0.0–0.1)
Immature Granulocytes: 1 %
Lymphocytes Absolute: 1.1 10*3/uL (ref 0.7–3.1)
Lymphs: 25 %
MCH: 28.6 pg (ref 26.6–33.0)
MCHC: 31.7 g/dL (ref 31.5–35.7)
MCV: 90 fL (ref 79–97)
Monocytes Absolute: 0.3 10*3/uL (ref 0.1–0.9)
Monocytes: 7 %
Neutrophils Absolute: 2.7 10*3/uL (ref 1.4–7.0)
Neutrophils: 64 %
Platelets: 155 10*3/uL (ref 150–450)
RBC: 4.27 x10E6/uL (ref 3.77–5.28)
RDW: 14.5 % (ref 11.7–15.4)
WBC: 4.2 10*3/uL (ref 3.4–10.8)

## 2019-10-04 LAB — APTT: aPTT: 30 s (ref 24–33)

## 2019-10-04 LAB — PROTIME-INR
INR: 1.1 (ref 0.9–1.2)
Prothrombin Time: 11.9 s (ref 9.1–12.0)

## 2019-10-04 LAB — HEMOGLOBIN A1C
Est. average glucose Bld gHb Est-mCnc: 85 mg/dL
Hgb A1c MFr Bld: 4.6 % — ABNORMAL LOW (ref 4.8–5.6)

## 2019-10-04 LAB — MICROALBUMIN / CREATININE URINE RATIO
Creatinine, Urine: 103.4 mg/dL
Microalb/Creat Ratio: 14 mg/g creat (ref 0–29)
Microalbumin, Urine: 14.2 ug/mL

## 2019-10-04 NOTE — Progress Notes (Signed)
Subjective:   PATIENT ID: Shannon Schultz GENDER: female DOB: 12/13/1945, MRN: [id number]   HPI  Chief Complaint  Patient presents with  . Follow-up    sob with exertion   Mrs. Joscelin Litke is a 74 year old female with moderate COPD  She has had an eventful year in 2018.  She lost her sister to breast cancer in early 2018, was diagnosed with breast cancer s/p lumpectomy, had bladder cancer and underwent resection and intravesicular chemo, fell and broke her right shoulder, underwent ventral hernia repair with mesh insertion. Last hospitalized for 2 days in April 2019 with COPD exacerbation and lower extremity edema.  Interim History: Compliant with Anoro and rarely has used albuterol. No exacerbations or steroids since our last visit. On review of telephone encounters including 04/11/19 and 05/22/19 which document nocturnal hypoxemia that has demonstrated mild OSA. She was previously scheduled for in-lab sleep titration however due to testing positive for COVID (asymptomatic), we were not able to complete PAP titration. She reports has not been able to use her oxygen at home due to how loud the machine is so has not been wearing it.  Social History: 46 pack years. Quit in 2012.  I have personally reviewed patient's past medical/family/social history/allergies/current medications.  Outpatient Medications Prior to Visit  Medication Sig Dispense Refill  . ACCU-CHEK AVIVA PLUS test strip TEST BLOOD SUGAR ONE TO TWO TIMES DAILY 100 each 2  . acetaminophen (TYLENOL) 500 MG tablet Take 1,000 mg every 6 (six) hours as needed by mouth for moderate pain or headache.     . albuterol (PROVENTIL) (2.5 MG/3ML) 0.083% nebulizer solution USE 1 VIAL BY NEBULIZATION EVERY FOUR HOURS AS NEEDED FOR WHEEZING OR SHORTNESS OF BREATH. 30 mL 0  . albuterol (VENTOLIN HFA) 108 (90 Base) MCG/ACT inhaler Inhale 2 puffs into the lungs every 6 (six) hours as needed for wheezing or shortness of breath. 8 g 5  .  aspirin EC 81 MG tablet Take 81 mg daily by mouth.    . bisoprolol-hydrochlorothiazide (ZIAC) 10-6.25 MG tablet TAKE 1 TABLET EVERY DAY 90 tablet 0  . CALCIUM-MAGNESIUM-VITAMIN D PO Take 1 tablet by mouth in the morning and at bedtime.     . cetirizine (KLS ALLER-TEC) 10 MG tablet Take 10 mg by mouth daily.    Marland Kitchen enoxaparin (LOVENOX) 40 MG/0.4ML injection Inject 0.4 mLs (40 mg total) into the skin daily. 12 mL 0  . fluticasone (FLONASE) 50 MCG/ACT nasal spray USE 2 SPRAYS IN EACH NOSTRIL EVERY DAY 48 g 0  . furosemide (LASIX) 20 MG tablet TAKE 1 TABLET (20 MG TOTAL) BY MOUTH DAILY AS NEEDED FOR FLUID OR EDEMA. 30 tablet 0  . metFORMIN (GLUCOPHAGE-XR) 500 MG 24 hr tablet Take 1 tablet (500 mg total) by mouth 3 (three) times daily. 270 tablet 1  . neomycin-polymyxin b-dexamethasone (MAXITROL) 3.5-10000-0.1 OINT APPLY TO RIGHT EYE BID UTD    . oxyCODONE-acetaminophen (PERCOCET/ROXICET) 5-325 MG tablet Take 1 tablet by mouth every 4 (four) hours as needed for moderate pain. 12 tablet 0  . simvastatin (ZOCOR) 20 MG tablet Take 1 tablet (20 mg total) by mouth at bedtime. 90 tablet 1  . triamcinolone cream (KENALOG) 0.1 % Apply 1 application topically 2 (two) times daily as needed (redness).     . TRUEplus Lancets 30G MISC TEST BLOOD SUGAR ONE TO TWO TIMES DAILY 200 each 0  . umeclidinium-vilanterol (ANORO ELLIPTA) 62.5-25 MCG/INH AEPB Inhale 1 puff into the lungs daily. 180 each  3   No facility-administered medications prior to visit.    Review of Systems  Constitutional: Negative for chills, diaphoresis, fever, malaise/fatigue and weight loss.  HENT: Negative for congestion.   Respiratory: Negative for cough, hemoptysis, sputum production, shortness of breath and wheezing.   Cardiovascular: Negative for chest pain, palpitations and leg swelling.    Objective:   Vitals:   10/04/19 1619  BP: 140/66  Pulse: 86  Temp: 98.1 F (36.7 C)  TempSrc: Temporal  SpO2: 94%  Weight: 204 lb (92.5 kg)    Height: 5\' 4"  (1.626 m)      Physical Exam: General: Well-appearing, no acute distress HENT: [place], AT Eyes: EOMI, no scleral icterus Respiratory: Clear to auscultation bilaterally.  No crackles, wheezing or rales Cardiovascular: RRR, -M/R/G, no JVD Extremities:-Edema,-tenderness Neuro: AAO x4, CNII-XII grossly intact Skin: Intact, no rashes or bruising Psych: Normal mood, normal affect   Imaging Screening CT chest 09/10/16 -moderate emphysema Screening CT 09/16/17-moderate emphysema, scattered subcentimeter pulmonary nodules.  Fatty liver with findings of cirrhosis. Screening CT 11/29/18- interval development of RUL nodule measuring 6.1 mm CT Chest 03/03/19 - slightly decreased RUL nodule measuring 5.3 mm  PFTs 08/28/11 FVC 2.71 [92%], FEV1 1.33 (62%), F/F 49, TLC 114%, DLCO 46% Moderate obstructive defect with moderate reduction in diffusion capacity. No bronchodilator response  11/08/15 FVC 2.05 [7%), FEV1 1.23 (55%), F/F 60 Moderate obstructive defect  09/24/16 FVC 2. (82%), FEV1 1.43 (62%), F/F 58 , TLC 99%, DLCO 54% Moderate obstruction with moderate diffusion defect. No bronchodilator response.  Labs A1AT 08/08/16- 162, PIMM  Imaging, labs and test noted above have been reviewed independently by me.    Assessment & Plan:  74 year old female with moderate COPD and nocturnal hypoxemia. She has not been able to use her oxygen at home due to how loud the machine is. I have emphasized the importance of treating her nocturnal hypoxemia to prevent end-organ damage and advised her to discuss with her DME to either exchange the machine or discuss other options.  Moderate COPD GOLD A/B CONTINUE Anoro ONE puff ONCE a day CONTINUE Albuterol. Use as needed  Nocturnal hypoxemic respiratory failure likely due to mild OSA - not controlled Wear 2L oxygen via nasal cannula nightly  Will discuss rescheduling in-lab sleep titration at next visit  Health maintenance  Immunization  History  Administered Date(s) Administered  . Fluad Quad(high Dose 65+) 04/07/2019  . Influenza Split 05/28/2011  . Influenza, High Dose Seasonal PF 04/07/2013, 05/15/2014, 05/30/2015, 03/25/2016, 03/05/2017, 04/01/2018  . Influenza, Seasonal, Injecte, Preservative Fre 06/07/2012  . PFIZER SARS-COV-2 Vaccination 08/20/2019, 09/13/2019  . Pneumococcal Conjugate-13 07/06/2014  . Pneumococcal Polysaccharide-23 12/15/2004, 05/28/2011  . Tdap 02/23/2008, 06/15/2018  . Zoster 05/17/2010  . Zoster Recombinat (Shingrix) 12/02/2017, 02/06/2018   Annual CT Lung - Due 02/2020  No orders of the defined types were placed in this encounter.  No orders of the defined types were placed in this encounter.  Return in about 6 months (around 04/04/2020).  Flavia Bruss Rodman Pickle, MD [facility] 10/04/2019 4:12 PM

## 2019-10-04 NOTE — Patient Instructions (Addendum)
Moderate COPD GOLD A/B CONTINUE Anoro ONE puff ONCE a day CONTINUE Albuterol. Use as needed  Nocturnal hypoxemic respiratory failure Wear 2L oxygen via nasal cannula nightly

## 2019-10-05 DIAGNOSIS — J449 Chronic obstructive pulmonary disease, unspecified: Secondary | ICD-10-CM | POA: Diagnosis not present

## 2019-10-05 DIAGNOSIS — I10 Essential (primary) hypertension: Secondary | ICD-10-CM | POA: Diagnosis not present

## 2019-10-05 DIAGNOSIS — I2510 Atherosclerotic heart disease of native coronary artery without angina pectoris: Secondary | ICD-10-CM | POA: Diagnosis not present

## 2019-10-05 DIAGNOSIS — S42251D Displaced fracture of greater tuberosity of right humerus, subsequent encounter for fracture with routine healing: Secondary | ICD-10-CM | POA: Diagnosis not present

## 2019-10-05 DIAGNOSIS — I872 Venous insufficiency (chronic) (peripheral): Secondary | ICD-10-CM | POA: Diagnosis not present

## 2019-10-05 DIAGNOSIS — J9611 Chronic respiratory failure with hypoxia: Secondary | ICD-10-CM | POA: Diagnosis not present

## 2019-10-05 DIAGNOSIS — S72141D Displaced intertrochanteric fracture of right femur, subsequent encounter for closed fracture with routine healing: Secondary | ICD-10-CM | POA: Diagnosis not present

## 2019-10-05 DIAGNOSIS — I350 Nonrheumatic aortic (valve) stenosis: Secondary | ICD-10-CM | POA: Diagnosis not present

## 2019-10-05 DIAGNOSIS — E1151 Type 2 diabetes mellitus with diabetic peripheral angiopathy without gangrene: Secondary | ICD-10-CM | POA: Diagnosis not present

## 2019-10-05 NOTE — Progress Notes (Signed)
Chief Complaint  Patient presents with  . Follow-up    on rehab hip and med check, non fasting. Also mentions that she has had some diarrhea for the last week, stopped her colace yesterday-has been off tramadol for 2.5 weeks and was still taking the colace.    Patient presents for 6 month follow-up on chronic issues, also hospital f/u. She is accompanied by her husband today. Since her physical in October she had COVID in 05/2019 (mild course; husband had been hospitalized).  She was hospitalized 2/23 - 3/3 after a fall where she suffered R hip fracture and R shoulder dislocation. She is s/p intramedullary nail femur placement by Dr. Lyla Glassing on 08/24/2019.  She was discharged with Lovenox x 30d for DVT prophylaxis.  She was in SNF and is now home. Left [facility] 3/23.  She is getting PT and OT once a week. She is using walker and doing well. Pain is controlled with Tylenol (mainly pain is at the R hip, shoulder only after PT). She has slight abdominal discomfort with reaching up, in upper stomach.  She reports that her hernia "ripped back open" and gradually getting worse. She has an appointment with Dr. Marlou Starks on 4/24  Diabetes: Blood sugars at home are running 94-119 in the mornings. Denies hypoglycemia, polydipsia and polyuria. Some urinary frequency at night since surgery (seems to have to void a lot once she lays flat). Lastyearlyeye exam wasin6/2020, no retinopathy found.  Patient follows a low sugar diet and checks feet regularly without concerns. Denies numbness, tingling other than intermittent numbness in the 2nd and 3rd toes on the right foot (h/o fractures, didn't heal right, per pt). No skin lesions. R leg is swelling more since surgery. Denies any weeping or discomfort. Takes metformin TID--this seems to be the most tolerable way to take it (GI side effects if she takes more than one at a time). She has had some loose stools x 1 week, she relates to taking colace. Just stopped it.  Hasn't needed any pain meds other than Tylenol in over a week.  Hypertension: Blood pressure at home is 130's/60's.  Denies headaches (just slight x 2 days), chest pain. Denies side effects of medications (on bisoprolol HCT). Compliant with medication Vertigo started in the hospital, worse at [facility]. Sometimes noted when she bends over.  Mostly feels it when laying flat and sometimes looking to R. She discussed this with PT, who gave her eye exercises.  She reports it is doing much better since home (was worse when at [facility]).  BP Readings from Last 3 Encounters:  10/04/19 140/66  08/31/19 116/60  06/17/19 (!) 148/77   COPD: She is doing well on Anoro, only rarely needs albuterol. She had recent follow-up with pulmonary. She has nocturnal hypoxemia and has not been able to use her oxygen at home due to how loud the machine is (it would "click"). She spoke with DME provider, and is going to change out oxygen (turn in the portable one she has which clicks, for the larger one.) She gets CT for screening for lung cancer, last was 03/2019. On this CT, aortic atherosclerosis, 3V CAD, hepatic steatosis and cirrhosis were also noted. (reason for ordering the PT/PTT done prior to visit, which were  Normal, see below).  She is under the care of cardiologist (now Dr. Recardo Evangelist).  She has known moderate aortic stenosis, monitored yearly with echocardiogram, last of which was 05/2019.  Aortic stenosis has worsened, remains in moderate range. Echo to  be repeated 1 year. She had nuclear stress test in 2016, low risk study. She denies syncope, angina, dyspnea.  Hyperlipidemia follow-up: Patient is reportedly following a low-fat, low cholesterol diet. Compliant withsimvastatinand denies medication side effects. She had labs done prior to visit. Lab Results  Component Value Date   CHOL 116 10/03/2019   HDL 54 10/03/2019   LDLCALC 43 10/03/2019   TRIG 101 10/03/2019   CHOLHDL 2.1 10/03/2019     Allergies:Using flonase nightly, zyrtec, and mucinex as needed. No nosebleeds recently (only once, mild; had issues in the past, resolved after aiming spray to the outside of the nose.)   PMH, PSH, SH reviewed  Outpatient Encounter Medications as of 10/06/2019  Medication Sig Note  . ACCU-CHEK AVIVA PLUS test strip TEST BLOOD SUGAR ONE TO TWO TIMES DAILY   . acetaminophen (TYLENOL) 500 MG tablet Take 1,000 mg every 6 (six) hours as needed by mouth for moderate pain or headache.    Marland Kitchen aspirin EC 81 MG tablet Take 81 mg daily by mouth.   . bisoprolol-hydrochlorothiazide (ZIAC) 10-6.25 MG tablet Take 1 tablet by mouth daily.   Marland Kitchen CALCIUM-MAGNESIUM-VITAMIN D PO Take 1 tablet by mouth in the morning and at bedtime.    . cetirizine (KLS ALLER-TEC) 10 MG tablet Take 10 mg by mouth daily.   . fluticasone (FLONASE) 50 MCG/ACT nasal spray USE 2 SPRAYS IN EACH NOSTRIL EVERY DAY   . metFORMIN (GLUCOPHAGE-XR) 500 MG 24 hr tablet Take 1 tablet (500 mg total) by mouth 3 (three) times daily.   . simvastatin (ZOCOR) 20 MG tablet Take 1 tablet (20 mg total) by mouth at bedtime.   . triamcinolone cream (KENALOG) 0.1 % Apply 1 application topically 2 (two) times daily as needed (redness).    . TRUEplus Lancets 30G MISC TEST BLOOD SUGAR ONE TO TWO TIMES DAILY   . umeclidinium-vilanterol (ANORO ELLIPTA) 62.5-25 MCG/INH AEPB Inhale 1 puff into the lungs daily.   . [DISCONTINUED] bisoprolol-hydrochlorothiazide (ZIAC) 10-6.25 MG tablet TAKE 1 TABLET EVERY DAY   . [DISCONTINUED] metFORMIN (GLUCOPHAGE-XR) 500 MG 24 hr tablet Take 1 tablet (500 mg total) by mouth 3 (three) times daily.   Marland Kitchen albuterol (PROVENTIL) (2.5 MG/3ML) 0.083% nebulizer solution USE 1 VIAL BY NEBULIZATION EVERY FOUR HOURS AS NEEDED FOR WHEEZING OR SHORTNESS OF BREATH. (Patient not taking: Reported on 10/06/2019)   . albuterol (VENTOLIN HFA) 108 (90 Base) MCG/ACT inhaler Inhale 2 puffs into the lungs every 6 (six) hours as needed for wheezing or  shortness of breath. (Patient not taking: Reported on 10/06/2019) 05/06/2019: PRN  . furosemide (LASIX) 20 MG tablet TAKE 1 TABLET (20 MG TOTAL) BY MOUTH DAILY AS NEEDED FOR FLUID OR EDEMA. (Patient not taking: Reported on 10/06/2019)   . [DISCONTINUED] enoxaparin (LOVENOX) 40 MG/0.4ML injection Inject 0.4 mLs (40 mg total) into the skin daily.   . [DISCONTINUED] neomycin-polymyxin b-dexamethasone (MAXITROL) 3.5-10000-0.1 OINT APPLY TO RIGHT EYE BID UTD   . [DISCONTINUED] oxyCODONE-acetaminophen (PERCOCET/ROXICET) 5-325 MG tablet Take 1 tablet by mouth every 4 (four) hours as needed for moderate pain.    No facility-administered encounter medications on file as of 10/06/2019.   Allergies  Allergen Reactions  . Contrast Media [Iodinated Diagnostic Agents] Anaphylaxis, Shortness Of Breath and Other (See Comments)    Could not breath  . Iohexol Anaphylaxis, Shortness Of Breath and Other (See Comments)    Immediately could not breathe  . Lisinopril Anaphylaxis, Shortness Of Breath and Rash  . Sulfa Antibiotics Anaphylaxis and Other (See  Comments)    Historical from mother, pt states that mother says she almost died from this drug  . Latex Other (See Comments)    Unless against on skin for a long time, blisters. Short term is okay.  . Codeine Nausea Only, Anxiety and Other (See Comments)    insomnia  . Levofloxacin Other (See Comments)    insomnia  . Lipitor [Atorvastatin Calcium] Rash  . Vicodin [Hydrocodone-Acetaminophen] Itching    ROS:  No fever, chills, URI symptoms, cough.  Breathing is well controlled. No nausea, vomiting.  +upper abdominal discomfort and recurrence of hernia per HPI.  +nocturia, but no other urinary complaints.  +loose stools x 1 week.  No blood or mucus in stool. No bleeding, bruising, rash.  See HPI.   PHYSICAL EXAM:  BP 130/70   Pulse 72   Temp 98 F (36.7 C) (Other (Comment))   Ht _0  (1.626 m)   Wt 204 lb 3.2 oz (92.6 kg)   LMP  (LMP Unknown)   BMI 35.05  kg/m    Wt Readings from Last 3 Encounters:  10/04/19 204 lb (92.5 kg)  08/23/19 211 lb (95.7 kg)  06/17/19 215 lb 6.4 oz (97.7 kg)   Well appearing, pleasant, obese female in good spirits.  HEENT: PERRL, EOMI, conjunctiva and sclera are clear, anicteric. Wearing mask. Neck: no lymphadenopathy or mass. No thyromegaly.  Murmur radiates to her carotids. Heart: regular rate and rhythm,Murmurunchanged, loudest atRUSB Lungs: clear bilaterally, no wheezes, rales, ronchi Abdomen: soft, obese.  She is mildly tender across the upper abdomen, with a slight palpable firmness in mid-epigastrium.  Large diffuse prominence/bulge with valsalva. Normal bowel sounds Back: no spinal or CVA tenderness Extremities:Trace to 1+ edema R>L.   Hyperpigmentation/venous stasis changes bilaterally, no erythema.  WHSS x 2 noted at R hip Neuro: alert and oriented. Walking with walker Psych: Normal mood, affect, hygiene and grooming   Lab Results  Component Value Date   HGBA1C 4.6 (L) 10/03/2019   Fasting glucose 115    Chemistry      Component Value Date/Time   NA 142 10/03/2019 0850   K 4.0 10/03/2019 0850   CL 103 10/03/2019 0850   CO2 25 10/03/2019 0850   BUN 16 10/03/2019 0850   CREATININE 0.58 10/03/2019 0850   CREATININE 0.75 03/05/2017 0736      Component Value Date/Time   CALCIUM 9.8 10/03/2019 0850   ALKPHOS 95 10/03/2019 0850   AST 23 10/03/2019 0850   ALT 11 10/03/2019 0850   BILITOT 0.7 10/03/2019 0850     Lab Results  Component Value Date   CHOL 116 10/03/2019   HDL 54 10/03/2019   LDLCALC 43 10/03/2019   TRIG 101 10/03/2019   CHOLHDL 2.1 10/03/2019   Urine microalb/Cr 14  Lab Results  Component Value Date   WBC 4.2 10/03/2019   HGB 12.2 10/03/2019   HCT 38.5 10/03/2019   MCV 90 10/03/2019   PLT 155 10/03/2019   PT and PTT normal   ASSESSMENT/PLAN:  Essential hypertension, benign - controlled - Plan: bisoprolol-hydrochlorothiazide (ZIAC) 10-6.25 MG  tablet  Pure hypercholesterolemia - lipids at goal, continue simvastatin  Type 2 diabetes mellitus with complications (HCC) - well controlled on metformin (if diarrhea doesn't improve, can lower dose to 2/d) - Plan: metFORMIN (GLUCOPHAGE-XR) 500 MG 24 hr tablet  Aortic atherosclerosis ([place]) - noted on CT. Continue aspirin and statin  Cirrhosis of liver without ascites, unspecified hepatic cirrhosis type ([place]) - normal function of liver.  Weight loss encouraged. Gets yearly CT's (lung cancer screening)  Closed fracture of right hip with routine healing, subsequent encounter - healing well. Cont PT  Chronic hypoxemic respiratory failure (HCC) - Will be getting new oxygen tank and resume use overnight. Discussed rec for o/n oximetry, concern re: mouth breathing (and [place] for O2)  COPD, group B, by GOLD 2017 classification ([place]) - stable on current regimen  Class 2 severe obesity due to excess calories with serious comorbidity and body mass index (BMI) of 35.0 to 35.9 in adult ([place]) - Weight loss encouraged   Reports having plenty of simvastatin still at home, denies needing refill today.  FTF time approx 45 minutes, plus additional time in chart review and documentation.  F/u as scheduled for CPE in October with labs prior A1c, c-met, tsh

## 2019-10-06 ENCOUNTER — Ambulatory Visit (INDEPENDENT_AMBULATORY_CARE_PROVIDER_SITE_OTHER): Payer: Medicare HMO | Admitting: Family Medicine

## 2019-10-06 ENCOUNTER — Other Ambulatory Visit: Payer: Self-pay

## 2019-10-06 ENCOUNTER — Encounter: Payer: Self-pay | Admitting: Family Medicine

## 2019-10-06 VITALS — BP 130/70 | HR 72 | Temp 98.0°F | Ht 64.0 in | Wt 204.2 lb

## 2019-10-06 DIAGNOSIS — K7460 Unspecified cirrhosis of liver: Secondary | ICD-10-CM | POA: Diagnosis not present

## 2019-10-06 DIAGNOSIS — S72001D Fracture of unspecified part of neck of right femur, subsequent encounter for closed fracture with routine healing: Secondary | ICD-10-CM | POA: Diagnosis not present

## 2019-10-06 DIAGNOSIS — I10 Essential (primary) hypertension: Secondary | ICD-10-CM | POA: Diagnosis not present

## 2019-10-06 DIAGNOSIS — E66812 Obesity, class 2: Secondary | ICD-10-CM

## 2019-10-06 DIAGNOSIS — E7800 Pure hypercholesterolemia, unspecified: Secondary | ICD-10-CM

## 2019-10-06 DIAGNOSIS — J9611 Chronic respiratory failure with hypoxia: Secondary | ICD-10-CM

## 2019-10-06 DIAGNOSIS — J449 Chronic obstructive pulmonary disease, unspecified: Secondary | ICD-10-CM

## 2019-10-06 DIAGNOSIS — I700 Atherosclerosis of aorta: Secondary | ICD-10-CM

## 2019-10-06 DIAGNOSIS — Z6835 Body mass index (BMI) 35.0-35.9, adult: Secondary | ICD-10-CM

## 2019-10-06 DIAGNOSIS — E118 Type 2 diabetes mellitus with unspecified complications: Secondary | ICD-10-CM

## 2019-10-06 DIAGNOSIS — Z5181 Encounter for therapeutic drug level monitoring: Secondary | ICD-10-CM

## 2019-10-06 MED ORDER — METFORMIN HCL ER 500 MG PO TB24: 500.0000 mg | ORAL_TABLET | Freq: Three times a day (TID) | ORAL | 1 refills | Status: DC

## 2019-10-06 MED ORDER — BISOPROLOL-HYDROCHLOROTHIAZIDE 10-6.25 MG PO TABS: 1.0000 | ORAL_TABLET | Freq: Every day | ORAL | 1 refills | Status: DC

## 2019-10-06 NOTE — Patient Instructions (Addendum)
Stop the Colace. If you aren't taking narcotic pain medications, you don't need stool softeners.  Shannon Schultz about your vertigo with the physical therapists if it is persistent/worsening.  Since you report being a mouth breather, and will be using a nasal cannula for oxygen, it is recommended that you have the oximetry study (pulse oximetry) done at night to ensure that it is helping.  If you are truly breathing out of your mouth, and the oxygen continues to stay low, then you may need to discuss using a mask instead.

## 2019-10-10 DIAGNOSIS — S42254A Nondisplaced fracture of greater tuberosity of right humerus, initial encounter for closed fracture: Secondary | ICD-10-CM | POA: Diagnosis not present

## 2019-10-10 DIAGNOSIS — S72141D Displaced intertrochanteric fracture of right femur, subsequent encounter for closed fracture with routine healing: Secondary | ICD-10-CM | POA: Diagnosis not present

## 2019-10-10 DIAGNOSIS — M25511 Pain in right shoulder: Secondary | ICD-10-CM | POA: Diagnosis not present

## 2019-10-10 DIAGNOSIS — B353 Tinea pedis: Secondary | ICD-10-CM | POA: Diagnosis not present

## 2019-10-11 DIAGNOSIS — I350 Nonrheumatic aortic (valve) stenosis: Secondary | ICD-10-CM | POA: Diagnosis not present

## 2019-10-11 DIAGNOSIS — I2510 Atherosclerotic heart disease of native coronary artery without angina pectoris: Secondary | ICD-10-CM | POA: Diagnosis not present

## 2019-10-11 DIAGNOSIS — I872 Venous insufficiency (chronic) (peripheral): Secondary | ICD-10-CM | POA: Diagnosis not present

## 2019-10-11 DIAGNOSIS — I10 Essential (primary) hypertension: Secondary | ICD-10-CM | POA: Diagnosis not present

## 2019-10-11 DIAGNOSIS — E1151 Type 2 diabetes mellitus with diabetic peripheral angiopathy without gangrene: Secondary | ICD-10-CM | POA: Diagnosis not present

## 2019-10-11 DIAGNOSIS — J9611 Chronic respiratory failure with hypoxia: Secondary | ICD-10-CM | POA: Diagnosis not present

## 2019-10-11 DIAGNOSIS — S72141D Displaced intertrochanteric fracture of right femur, subsequent encounter for closed fracture with routine healing: Secondary | ICD-10-CM | POA: Diagnosis not present

## 2019-10-11 DIAGNOSIS — J449 Chronic obstructive pulmonary disease, unspecified: Secondary | ICD-10-CM | POA: Diagnosis not present

## 2019-10-11 DIAGNOSIS — S42251D Displaced fracture of greater tuberosity of right humerus, subsequent encounter for fracture with routine healing: Secondary | ICD-10-CM | POA: Diagnosis not present

## 2019-10-12 DIAGNOSIS — S42251D Displaced fracture of greater tuberosity of right humerus, subsequent encounter for fracture with routine healing: Secondary | ICD-10-CM | POA: Diagnosis not present

## 2019-10-12 DIAGNOSIS — I872 Venous insufficiency (chronic) (peripheral): Secondary | ICD-10-CM | POA: Diagnosis not present

## 2019-10-12 DIAGNOSIS — J449 Chronic obstructive pulmonary disease, unspecified: Secondary | ICD-10-CM | POA: Diagnosis not present

## 2019-10-12 DIAGNOSIS — E1151 Type 2 diabetes mellitus with diabetic peripheral angiopathy without gangrene: Secondary | ICD-10-CM | POA: Diagnosis not present

## 2019-10-12 DIAGNOSIS — S72141D Displaced intertrochanteric fracture of right femur, subsequent encounter for closed fracture with routine healing: Secondary | ICD-10-CM | POA: Diagnosis not present

## 2019-10-12 DIAGNOSIS — J9611 Chronic respiratory failure with hypoxia: Secondary | ICD-10-CM | POA: Diagnosis not present

## 2019-10-12 DIAGNOSIS — I350 Nonrheumatic aortic (valve) stenosis: Secondary | ICD-10-CM | POA: Diagnosis not present

## 2019-10-12 DIAGNOSIS — I10 Essential (primary) hypertension: Secondary | ICD-10-CM | POA: Diagnosis not present

## 2019-10-12 DIAGNOSIS — I2510 Atherosclerotic heart disease of native coronary artery without angina pectoris: Secondary | ICD-10-CM | POA: Diagnosis not present

## 2019-10-18 DIAGNOSIS — I2510 Atherosclerotic heart disease of native coronary artery without angina pectoris: Secondary | ICD-10-CM | POA: Diagnosis not present

## 2019-10-18 DIAGNOSIS — I350 Nonrheumatic aortic (valve) stenosis: Secondary | ICD-10-CM | POA: Diagnosis not present

## 2019-10-18 DIAGNOSIS — S72141D Displaced intertrochanteric fracture of right femur, subsequent encounter for closed fracture with routine healing: Secondary | ICD-10-CM | POA: Diagnosis not present

## 2019-10-18 DIAGNOSIS — J449 Chronic obstructive pulmonary disease, unspecified: Secondary | ICD-10-CM | POA: Diagnosis not present

## 2019-10-18 DIAGNOSIS — S42251D Displaced fracture of greater tuberosity of right humerus, subsequent encounter for fracture with routine healing: Secondary | ICD-10-CM | POA: Diagnosis not present

## 2019-10-18 DIAGNOSIS — I10 Essential (primary) hypertension: Secondary | ICD-10-CM | POA: Diagnosis not present

## 2019-10-18 DIAGNOSIS — I872 Venous insufficiency (chronic) (peripheral): Secondary | ICD-10-CM | POA: Diagnosis not present

## 2019-10-18 DIAGNOSIS — J9611 Chronic respiratory failure with hypoxia: Secondary | ICD-10-CM | POA: Diagnosis not present

## 2019-10-18 DIAGNOSIS — E1151 Type 2 diabetes mellitus with diabetic peripheral angiopathy without gangrene: Secondary | ICD-10-CM | POA: Diagnosis not present

## 2019-10-19 DIAGNOSIS — I10 Essential (primary) hypertension: Secondary | ICD-10-CM | POA: Diagnosis not present

## 2019-10-19 DIAGNOSIS — J9611 Chronic respiratory failure with hypoxia: Secondary | ICD-10-CM | POA: Diagnosis not present

## 2019-10-19 DIAGNOSIS — I2510 Atherosclerotic heart disease of native coronary artery without angina pectoris: Secondary | ICD-10-CM | POA: Diagnosis not present

## 2019-10-19 DIAGNOSIS — I350 Nonrheumatic aortic (valve) stenosis: Secondary | ICD-10-CM | POA: Diagnosis not present

## 2019-10-19 DIAGNOSIS — S42251D Displaced fracture of greater tuberosity of right humerus, subsequent encounter for fracture with routine healing: Secondary | ICD-10-CM | POA: Diagnosis not present

## 2019-10-19 DIAGNOSIS — S72141D Displaced intertrochanteric fracture of right femur, subsequent encounter for closed fracture with routine healing: Secondary | ICD-10-CM | POA: Diagnosis not present

## 2019-10-19 DIAGNOSIS — J449 Chronic obstructive pulmonary disease, unspecified: Secondary | ICD-10-CM | POA: Diagnosis not present

## 2019-10-19 DIAGNOSIS — E1151 Type 2 diabetes mellitus with diabetic peripheral angiopathy without gangrene: Secondary | ICD-10-CM | POA: Diagnosis not present

## 2019-10-19 DIAGNOSIS — I872 Venous insufficiency (chronic) (peripheral): Secondary | ICD-10-CM | POA: Diagnosis not present

## 2019-10-21 DIAGNOSIS — R262 Difficulty in walking, not elsewhere classified: Secondary | ICD-10-CM | POA: Diagnosis not present

## 2019-10-21 DIAGNOSIS — S72141S Displaced intertrochanteric fracture of right femur, sequela: Secondary | ICD-10-CM | POA: Diagnosis not present

## 2019-10-21 DIAGNOSIS — J449 Chronic obstructive pulmonary disease, unspecified: Secondary | ICD-10-CM | POA: Diagnosis not present

## 2019-10-21 DIAGNOSIS — M6281 Muscle weakness (generalized): Secondary | ICD-10-CM | POA: Diagnosis not present

## 2019-10-21 DIAGNOSIS — S43004A Unspecified dislocation of right shoulder joint, initial encounter: Secondary | ICD-10-CM | POA: Diagnosis not present

## 2019-10-23 DIAGNOSIS — I2510 Atherosclerotic heart disease of native coronary artery without angina pectoris: Secondary | ICD-10-CM | POA: Diagnosis not present

## 2019-10-23 DIAGNOSIS — J449 Chronic obstructive pulmonary disease, unspecified: Secondary | ICD-10-CM | POA: Diagnosis not present

## 2019-10-23 DIAGNOSIS — E1151 Type 2 diabetes mellitus with diabetic peripheral angiopathy without gangrene: Secondary | ICD-10-CM | POA: Diagnosis not present

## 2019-10-23 DIAGNOSIS — I350 Nonrheumatic aortic (valve) stenosis: Secondary | ICD-10-CM | POA: Diagnosis not present

## 2019-10-23 DIAGNOSIS — I872 Venous insufficiency (chronic) (peripheral): Secondary | ICD-10-CM | POA: Diagnosis not present

## 2019-10-23 DIAGNOSIS — S72141D Displaced intertrochanteric fracture of right femur, subsequent encounter for closed fracture with routine healing: Secondary | ICD-10-CM | POA: Diagnosis not present

## 2019-10-23 DIAGNOSIS — I10 Essential (primary) hypertension: Secondary | ICD-10-CM | POA: Diagnosis not present

## 2019-10-23 DIAGNOSIS — S42251D Displaced fracture of greater tuberosity of right humerus, subsequent encounter for fracture with routine healing: Secondary | ICD-10-CM | POA: Diagnosis not present

## 2019-10-23 DIAGNOSIS — J9611 Chronic respiratory failure with hypoxia: Secondary | ICD-10-CM | POA: Diagnosis not present

## 2019-10-24 DIAGNOSIS — I350 Nonrheumatic aortic (valve) stenosis: Secondary | ICD-10-CM | POA: Diagnosis not present

## 2019-10-24 DIAGNOSIS — E1151 Type 2 diabetes mellitus with diabetic peripheral angiopathy without gangrene: Secondary | ICD-10-CM | POA: Diagnosis not present

## 2019-10-24 DIAGNOSIS — J9611 Chronic respiratory failure with hypoxia: Secondary | ICD-10-CM | POA: Diagnosis not present

## 2019-10-24 DIAGNOSIS — S42251D Displaced fracture of greater tuberosity of right humerus, subsequent encounter for fracture with routine healing: Secondary | ICD-10-CM | POA: Diagnosis not present

## 2019-10-24 DIAGNOSIS — I872 Venous insufficiency (chronic) (peripheral): Secondary | ICD-10-CM | POA: Diagnosis not present

## 2019-10-24 DIAGNOSIS — I10 Essential (primary) hypertension: Secondary | ICD-10-CM | POA: Diagnosis not present

## 2019-10-24 DIAGNOSIS — J449 Chronic obstructive pulmonary disease, unspecified: Secondary | ICD-10-CM | POA: Diagnosis not present

## 2019-10-24 DIAGNOSIS — I2510 Atherosclerotic heart disease of native coronary artery without angina pectoris: Secondary | ICD-10-CM | POA: Diagnosis not present

## 2019-10-24 DIAGNOSIS — S72141D Displaced intertrochanteric fracture of right femur, subsequent encounter for closed fracture with routine healing: Secondary | ICD-10-CM | POA: Diagnosis not present

## 2019-10-26 DIAGNOSIS — S72141D Displaced intertrochanteric fracture of right femur, subsequent encounter for closed fracture with routine healing: Secondary | ICD-10-CM | POA: Diagnosis not present

## 2019-10-26 DIAGNOSIS — J449 Chronic obstructive pulmonary disease, unspecified: Secondary | ICD-10-CM | POA: Diagnosis not present

## 2019-10-26 DIAGNOSIS — J9611 Chronic respiratory failure with hypoxia: Secondary | ICD-10-CM | POA: Diagnosis not present

## 2019-10-26 DIAGNOSIS — S42251D Displaced fracture of greater tuberosity of right humerus, subsequent encounter for fracture with routine healing: Secondary | ICD-10-CM | POA: Diagnosis not present

## 2019-10-26 DIAGNOSIS — I350 Nonrheumatic aortic (valve) stenosis: Secondary | ICD-10-CM | POA: Diagnosis not present

## 2019-10-26 DIAGNOSIS — I10 Essential (primary) hypertension: Secondary | ICD-10-CM | POA: Diagnosis not present

## 2019-10-26 DIAGNOSIS — I872 Venous insufficiency (chronic) (peripheral): Secondary | ICD-10-CM | POA: Diagnosis not present

## 2019-10-26 DIAGNOSIS — E1151 Type 2 diabetes mellitus with diabetic peripheral angiopathy without gangrene: Secondary | ICD-10-CM | POA: Diagnosis not present

## 2019-10-26 DIAGNOSIS — K439 Ventral hernia without obstruction or gangrene: Secondary | ICD-10-CM | POA: Diagnosis not present

## 2019-10-26 DIAGNOSIS — I2510 Atherosclerotic heart disease of native coronary artery without angina pectoris: Secondary | ICD-10-CM | POA: Diagnosis not present

## 2019-10-28 ENCOUNTER — Other Ambulatory Visit: Payer: Self-pay | Admitting: General Surgery

## 2019-10-28 ENCOUNTER — Other Ambulatory Visit: Payer: Self-pay | Admitting: Family Medicine

## 2019-10-28 DIAGNOSIS — Z1231 Encounter for screening mammogram for malignant neoplasm of breast: Secondary | ICD-10-CM

## 2019-10-28 DIAGNOSIS — K439 Ventral hernia without obstruction or gangrene: Secondary | ICD-10-CM

## 2019-11-01 DIAGNOSIS — I2510 Atherosclerotic heart disease of native coronary artery without angina pectoris: Secondary | ICD-10-CM | POA: Diagnosis not present

## 2019-11-01 DIAGNOSIS — I872 Venous insufficiency (chronic) (peripheral): Secondary | ICD-10-CM | POA: Diagnosis not present

## 2019-11-01 DIAGNOSIS — E1151 Type 2 diabetes mellitus with diabetic peripheral angiopathy without gangrene: Secondary | ICD-10-CM | POA: Diagnosis not present

## 2019-11-01 DIAGNOSIS — J449 Chronic obstructive pulmonary disease, unspecified: Secondary | ICD-10-CM | POA: Diagnosis not present

## 2019-11-01 DIAGNOSIS — I350 Nonrheumatic aortic (valve) stenosis: Secondary | ICD-10-CM | POA: Diagnosis not present

## 2019-11-01 DIAGNOSIS — S42251D Displaced fracture of greater tuberosity of right humerus, subsequent encounter for fracture with routine healing: Secondary | ICD-10-CM | POA: Diagnosis not present

## 2019-11-01 DIAGNOSIS — J9611 Chronic respiratory failure with hypoxia: Secondary | ICD-10-CM | POA: Diagnosis not present

## 2019-11-01 DIAGNOSIS — S72141D Displaced intertrochanteric fracture of right femur, subsequent encounter for closed fracture with routine healing: Secondary | ICD-10-CM | POA: Diagnosis not present

## 2019-11-01 DIAGNOSIS — I10 Essential (primary) hypertension: Secondary | ICD-10-CM | POA: Diagnosis not present

## 2019-11-02 ENCOUNTER — Other Ambulatory Visit: Payer: Self-pay

## 2019-11-02 ENCOUNTER — Ambulatory Visit (INDEPENDENT_AMBULATORY_CARE_PROVIDER_SITE_OTHER): Payer: Medicare HMO | Admitting: Primary Care

## 2019-11-02 ENCOUNTER — Encounter: Payer: Self-pay | Admitting: Primary Care

## 2019-11-02 DIAGNOSIS — E1151 Type 2 diabetes mellitus with diabetic peripheral angiopathy without gangrene: Secondary | ICD-10-CM | POA: Diagnosis not present

## 2019-11-02 DIAGNOSIS — I350 Nonrheumatic aortic (valve) stenosis: Secondary | ICD-10-CM | POA: Diagnosis not present

## 2019-11-02 DIAGNOSIS — J9611 Chronic respiratory failure with hypoxia: Secondary | ICD-10-CM | POA: Diagnosis not present

## 2019-11-02 DIAGNOSIS — J449 Chronic obstructive pulmonary disease, unspecified: Secondary | ICD-10-CM

## 2019-11-02 DIAGNOSIS — I872 Venous insufficiency (chronic) (peripheral): Secondary | ICD-10-CM | POA: Diagnosis not present

## 2019-11-02 DIAGNOSIS — S42251D Displaced fracture of greater tuberosity of right humerus, subsequent encounter for fracture with routine healing: Secondary | ICD-10-CM | POA: Diagnosis not present

## 2019-11-02 DIAGNOSIS — R0989 Other specified symptoms and signs involving the circulatory and respiratory systems: Secondary | ICD-10-CM | POA: Diagnosis not present

## 2019-11-02 DIAGNOSIS — S72141D Displaced intertrochanteric fracture of right femur, subsequent encounter for closed fracture with routine healing: Secondary | ICD-10-CM | POA: Diagnosis not present

## 2019-11-02 DIAGNOSIS — I2510 Atherosclerotic heart disease of native coronary artery without angina pectoris: Secondary | ICD-10-CM | POA: Diagnosis not present

## 2019-11-02 DIAGNOSIS — I10 Essential (primary) hypertension: Secondary | ICD-10-CM | POA: Diagnosis not present

## 2019-11-02 NOTE — Progress Notes (Signed)
Virtual Visit via Telephone Note  I connected with Shannon Schultz on 11/02/19 at  2:00 PM EDT by telephone and verified that I am speaking with the correct person using two identifiers.  Location: Patient: Home Provider: Home   I discussed the limitations, risks, security and privacy concerns of performing an evaluation and management service by telephone and the availability of in person appointments. I also discussed with the patient that there may be a patient responsible charge related to this service. The patient expressed understanding and agreed to proceed.   History of Present Illness: 74 year old female, former smoker quit 2012 (46 pack year hx). PMH significant for COPD GROUP D, chronic hypoxemic respiratory failure, sinusitis, cirrhosis of the liver, type 2 diabetes, urothelial cancer, COVID-19 May 2019. Patient of Dr. Loanne Drilling, last seen April 6th 2021. Maintained on Anoro Ellipta.   11/02/2019 Patient contacted today for acute televisit. Reports increased mucus production, described as thick clear sputum x 1 week. She has associated some nasal congestion. Breathing is fine, she has not needed to use Albuterol rescue inhaler or nebulizer. Not current taking anything for cough. Uses flonsae nasal daily, tried saline ocean nasal spray once. She was recently started on oxygen at night when she noticed increase in her congestion. Denies chest tightness, shortness of breath, back pain or chest pain.   Observations/Objective:  - Able to speak in full sentences; no shortness of breath, wheezing or cough  Assessment and Plan:  Chest congestion: - Recommend Mucinex 1,200mg  twice daily x 10 days with full glass of water - Continue flonase nasal spray once daily - Use saline ocean nasal spray twice daily - Monitor for purulent mucus or fever  COPD - No signs of acute exacerbation - Continue Anoro Ellipta one puff daily; prn albuterol hfa/nebulizer q 4-6 hours for  sob/wheezing  Nocturnal hypoxemia - Add humidification to oxygen   Follow Up Instructions:  - Notify office if no improvement or develops purulent mucus, fever or shortness of breath  I discussed the assessment and treatment plan with the patient. The patient was provided an opportunity to ask questions and all were answered. The patient agreed with the plan and demonstrated an understanding of the instructions.   The patient was advised to call back or seek an in-person evaluation if the symptoms worsen or if the condition fails to improve as anticipated.  I provided 18 minutes of non-face-to-face time during this encounter.   Martyn Ehrich, NP

## 2019-11-02 NOTE — Patient Instructions (Signed)
Recommendations: Mucinex 1,200mg  twice daily x 10 days (with full glass of water) Saline ocean nasal spray twice daily Flonase once daily  Orders: Humidification for oxygen

## 2019-11-03 NOTE — Addendum Note (Signed)
Addended by: June Leap on: 11/03/2019 05:12 PM   Modules accepted: Orders

## 2019-11-03 NOTE — Addendum Note (Signed)
Addended by: June Leap on: 11/03/2019 05:21 PM   Modules accepted: Orders

## 2019-11-07 DIAGNOSIS — I2510 Atherosclerotic heart disease of native coronary artery without angina pectoris: Secondary | ICD-10-CM | POA: Diagnosis not present

## 2019-11-07 DIAGNOSIS — S42251D Displaced fracture of greater tuberosity of right humerus, subsequent encounter for fracture with routine healing: Secondary | ICD-10-CM | POA: Diagnosis not present

## 2019-11-07 DIAGNOSIS — J449 Chronic obstructive pulmonary disease, unspecified: Secondary | ICD-10-CM | POA: Diagnosis not present

## 2019-11-07 DIAGNOSIS — I10 Essential (primary) hypertension: Secondary | ICD-10-CM | POA: Diagnosis not present

## 2019-11-07 DIAGNOSIS — J9611 Chronic respiratory failure with hypoxia: Secondary | ICD-10-CM | POA: Diagnosis not present

## 2019-11-07 DIAGNOSIS — S72141D Displaced intertrochanteric fracture of right femur, subsequent encounter for closed fracture with routine healing: Secondary | ICD-10-CM | POA: Diagnosis not present

## 2019-11-07 DIAGNOSIS — E1151 Type 2 diabetes mellitus with diabetic peripheral angiopathy without gangrene: Secondary | ICD-10-CM | POA: Diagnosis not present

## 2019-11-07 DIAGNOSIS — I872 Venous insufficiency (chronic) (peripheral): Secondary | ICD-10-CM | POA: Diagnosis not present

## 2019-11-07 DIAGNOSIS — I350 Nonrheumatic aortic (valve) stenosis: Secondary | ICD-10-CM | POA: Diagnosis not present

## 2019-11-08 DIAGNOSIS — I350 Nonrheumatic aortic (valve) stenosis: Secondary | ICD-10-CM | POA: Diagnosis not present

## 2019-11-08 DIAGNOSIS — I2510 Atherosclerotic heart disease of native coronary artery without angina pectoris: Secondary | ICD-10-CM | POA: Diagnosis not present

## 2019-11-08 DIAGNOSIS — J9611 Chronic respiratory failure with hypoxia: Secondary | ICD-10-CM | POA: Diagnosis not present

## 2019-11-08 DIAGNOSIS — I10 Essential (primary) hypertension: Secondary | ICD-10-CM | POA: Diagnosis not present

## 2019-11-08 DIAGNOSIS — E1151 Type 2 diabetes mellitus with diabetic peripheral angiopathy without gangrene: Secondary | ICD-10-CM | POA: Diagnosis not present

## 2019-11-08 DIAGNOSIS — S42251D Displaced fracture of greater tuberosity of right humerus, subsequent encounter for fracture with routine healing: Secondary | ICD-10-CM | POA: Diagnosis not present

## 2019-11-08 DIAGNOSIS — I872 Venous insufficiency (chronic) (peripheral): Secondary | ICD-10-CM | POA: Diagnosis not present

## 2019-11-08 DIAGNOSIS — J449 Chronic obstructive pulmonary disease, unspecified: Secondary | ICD-10-CM | POA: Diagnosis not present

## 2019-11-08 DIAGNOSIS — S72141D Displaced intertrochanteric fracture of right femur, subsequent encounter for closed fracture with routine healing: Secondary | ICD-10-CM | POA: Diagnosis not present

## 2019-11-11 ENCOUNTER — Ambulatory Visit
Admission: RE | Admit: 2019-11-11 | Discharge: 2019-11-11 | Disposition: A | Discharge status: Home / Self Care | Payer: Medicare HMO | Source: Ambulatory Visit | Attending: General Surgery | Admitting: General Surgery

## 2019-11-11 ENCOUNTER — Other Ambulatory Visit: Payer: Self-pay

## 2019-11-11 DIAGNOSIS — K5730 Diverticulosis of large intestine without perforation or abscess without bleeding: Secondary | ICD-10-CM | POA: Diagnosis not present

## 2019-11-11 DIAGNOSIS — K439 Ventral hernia without obstruction or gangrene: Secondary | ICD-10-CM | POA: Diagnosis not present

## 2019-11-12 DIAGNOSIS — Z9013 Acquired absence of bilateral breasts and nipples: Secondary | ICD-10-CM | POA: Diagnosis not present

## 2019-11-16 DIAGNOSIS — I350 Nonrheumatic aortic (valve) stenosis: Secondary | ICD-10-CM | POA: Diagnosis not present

## 2019-11-16 DIAGNOSIS — I872 Venous insufficiency (chronic) (peripheral): Secondary | ICD-10-CM | POA: Diagnosis not present

## 2019-11-16 DIAGNOSIS — E1151 Type 2 diabetes mellitus with diabetic peripheral angiopathy without gangrene: Secondary | ICD-10-CM | POA: Diagnosis not present

## 2019-11-16 DIAGNOSIS — I2510 Atherosclerotic heart disease of native coronary artery without angina pectoris: Secondary | ICD-10-CM | POA: Diagnosis not present

## 2019-11-16 DIAGNOSIS — J449 Chronic obstructive pulmonary disease, unspecified: Secondary | ICD-10-CM | POA: Diagnosis not present

## 2019-11-16 DIAGNOSIS — S72141D Displaced intertrochanteric fracture of right femur, subsequent encounter for closed fracture with routine healing: Secondary | ICD-10-CM | POA: Diagnosis not present

## 2019-11-16 DIAGNOSIS — J9611 Chronic respiratory failure with hypoxia: Secondary | ICD-10-CM | POA: Diagnosis not present

## 2019-11-16 DIAGNOSIS — S42251D Displaced fracture of greater tuberosity of right humerus, subsequent encounter for fracture with routine healing: Secondary | ICD-10-CM | POA: Diagnosis not present

## 2019-11-16 DIAGNOSIS — I10 Essential (primary) hypertension: Secondary | ICD-10-CM | POA: Diagnosis not present

## 2019-11-17 DIAGNOSIS — I2510 Atherosclerotic heart disease of native coronary artery without angina pectoris: Secondary | ICD-10-CM | POA: Diagnosis not present

## 2019-11-17 DIAGNOSIS — I872 Venous insufficiency (chronic) (peripheral): Secondary | ICD-10-CM | POA: Diagnosis not present

## 2019-11-17 DIAGNOSIS — E1151 Type 2 diabetes mellitus with diabetic peripheral angiopathy without gangrene: Secondary | ICD-10-CM | POA: Diagnosis not present

## 2019-11-17 DIAGNOSIS — J449 Chronic obstructive pulmonary disease, unspecified: Secondary | ICD-10-CM | POA: Diagnosis not present

## 2019-11-17 DIAGNOSIS — I350 Nonrheumatic aortic (valve) stenosis: Secondary | ICD-10-CM | POA: Diagnosis not present

## 2019-11-17 DIAGNOSIS — I10 Essential (primary) hypertension: Secondary | ICD-10-CM | POA: Diagnosis not present

## 2019-11-17 DIAGNOSIS — S72141D Displaced intertrochanteric fracture of right femur, subsequent encounter for closed fracture with routine healing: Secondary | ICD-10-CM | POA: Diagnosis not present

## 2019-11-17 DIAGNOSIS — S42251D Displaced fracture of greater tuberosity of right humerus, subsequent encounter for fracture with routine healing: Secondary | ICD-10-CM | POA: Diagnosis not present

## 2019-11-17 DIAGNOSIS — J9611 Chronic respiratory failure with hypoxia: Secondary | ICD-10-CM | POA: Diagnosis not present

## 2019-11-18 DIAGNOSIS — Z4431 Encounter for fitting and adjustment of external right breast prosthesis: Secondary | ICD-10-CM | POA: Diagnosis not present

## 2019-11-18 DIAGNOSIS — C50111 Malignant neoplasm of central portion of right female breast: Secondary | ICD-10-CM | POA: Diagnosis not present

## 2019-11-20 DIAGNOSIS — M6281 Muscle weakness (generalized): Secondary | ICD-10-CM | POA: Diagnosis not present

## 2019-11-20 DIAGNOSIS — J449 Chronic obstructive pulmonary disease, unspecified: Secondary | ICD-10-CM | POA: Diagnosis not present

## 2019-11-20 DIAGNOSIS — R262 Difficulty in walking, not elsewhere classified: Secondary | ICD-10-CM | POA: Diagnosis not present

## 2019-11-20 DIAGNOSIS — S43004A Unspecified dislocation of right shoulder joint, initial encounter: Secondary | ICD-10-CM | POA: Diagnosis not present

## 2019-11-20 DIAGNOSIS — S72141S Displaced intertrochanteric fracture of right femur, sequela: Secondary | ICD-10-CM | POA: Diagnosis not present

## 2019-11-21 DIAGNOSIS — S72141D Displaced intertrochanteric fracture of right femur, subsequent encounter for closed fracture with routine healing: Secondary | ICD-10-CM | POA: Diagnosis not present

## 2019-11-21 DIAGNOSIS — S42254A Nondisplaced fracture of greater tuberosity of right humerus, initial encounter for closed fracture: Secondary | ICD-10-CM | POA: Diagnosis not present

## 2019-11-23 ENCOUNTER — Telehealth: Payer: Self-pay | Admitting: Primary Care

## 2019-11-23 DIAGNOSIS — Z961 Presence of intraocular lens: Secondary | ICD-10-CM | POA: Diagnosis not present

## 2019-11-23 DIAGNOSIS — H02403 Unspecified ptosis of bilateral eyelids: Secondary | ICD-10-CM | POA: Diagnosis not present

## 2019-11-23 DIAGNOSIS — H35363 Drusen (degenerative) of macula, bilateral: Secondary | ICD-10-CM | POA: Diagnosis not present

## 2019-11-23 DIAGNOSIS — E119 Type 2 diabetes mellitus without complications: Secondary | ICD-10-CM | POA: Diagnosis not present

## 2019-11-23 LAB — HM DIABETES EYE EXAM

## 2019-11-23 MED ORDER — DOXYCYCLINE HYCLATE 100 MG PO TABS: 100.0000 mg | ORAL_TABLET | Freq: Two times a day (BID) | ORAL | 0 refills | Status: DC

## 2019-11-23 MED ORDER — PREDNISONE 10 MG PO TABS: ORAL_TABLET | ORAL | 0 refills | Status: DC

## 2019-11-23 NOTE — Telephone Encounter (Signed)
Spoke with patient. She had a televisit with Beth on 11/02/19 due to a cough that she has had for a month. She was advised to take Mucinex twice a day as well as Flonase as night. She did this for 10 days and did not see any relief so she stopped both medications. Now both ears feel clogged but she denied seeing any discharge from her ears. She has not noticed an increase in SOB. The cough is productive with thick clear mucus. She denied any fevers or body aches. Also denied being around anyone sick within the last month.   She wants to what else can she do.   Pharmacy is [facility] on [address] and [address].   Beth, please advise. Thanks!

## 2019-11-23 NOTE — Telephone Encounter (Signed)
I will send in ABX and prednisone taper. I would continue flonase

## 2019-11-23 NOTE — Telephone Encounter (Signed)
Called and left detailed message for patient as she requested since she is currently at another appt. Will keep this encounter open in case she calls back with questions.

## 2019-11-24 ENCOUNTER — Encounter: Payer: Self-pay | Admitting: *Deleted

## 2019-12-12 ENCOUNTER — Other Ambulatory Visit: Payer: Self-pay

## 2019-12-12 ENCOUNTER — Ambulatory Visit
Admission: RE | Admit: 2019-12-12 | Discharge: 2019-12-12 | Disposition: A | Discharge status: Home / Self Care | Payer: Medicare HMO | Source: Ambulatory Visit | Attending: Family Medicine | Admitting: Family Medicine

## 2019-12-12 DIAGNOSIS — Z1231 Encounter for screening mammogram for malignant neoplasm of breast: Secondary | ICD-10-CM

## 2019-12-19 ENCOUNTER — Telehealth: Payer: Self-pay | Admitting: Pulmonary Disease

## 2019-12-19 NOTE — Telephone Encounter (Signed)
Spoke with pt. She has been scheduled with Beth tomorrow at 1030. Nothing further was needed.

## 2019-12-20 ENCOUNTER — Ambulatory Visit: Payer: Medicare HMO | Admitting: Primary Care

## 2019-12-20 ENCOUNTER — Encounter: Payer: Self-pay | Admitting: Primary Care

## 2019-12-20 ENCOUNTER — Other Ambulatory Visit: Payer: Self-pay

## 2019-12-20 ENCOUNTER — Ambulatory Visit (INDEPENDENT_AMBULATORY_CARE_PROVIDER_SITE_OTHER): Payer: Medicare HMO

## 2019-12-20 VITALS — BP 130/70 | HR 72 | Temp 97.2°F | Ht 64.0 in | Wt 210.4 lb

## 2019-12-20 DIAGNOSIS — R059 Cough, unspecified: Secondary | ICD-10-CM

## 2019-12-20 DIAGNOSIS — R05 Cough: Secondary | ICD-10-CM

## 2019-12-20 DIAGNOSIS — R058 Other specified cough: Secondary | ICD-10-CM

## 2019-12-20 DIAGNOSIS — R0989 Other specified symptoms and signs involving the circulatory and respiratory systems: Secondary | ICD-10-CM

## 2019-12-20 DIAGNOSIS — J329 Chronic sinusitis, unspecified: Secondary | ICD-10-CM

## 2019-12-20 DIAGNOSIS — J449 Chronic obstructive pulmonary disease, unspecified: Secondary | ICD-10-CM

## 2019-12-20 DIAGNOSIS — J439 Emphysema, unspecified: Secondary | ICD-10-CM | POA: Diagnosis not present

## 2019-12-20 MED ORDER — MONTELUKAST SODIUM 10 MG PO TABS
10.0000 mg | ORAL_TABLET | Freq: Every day | ORAL | 2 refills | Status: DC
Start: 2019-12-20 — End: 2020-03-20

## 2019-12-20 NOTE — Assessment & Plan Note (Addendum)
-   Stable interval, no shortness of breath or wheezing  - Provided Anoro samples today and given patient assistance paper work to fill out ' - Follow-up: 2-3 months with Dr. Loanne Drilling

## 2019-12-20 NOTE — Progress Notes (Signed)
 @Patient ID: Shannon Schultz, female    DOB: 02/24/1946, 74 y.o.   MRN: [id number]  Chief Complaint  Patient presents with  . Follow-up    f/u cough, still coughing    Referring provider: Knapp, Eve, MD  HPI: 74 year old female, former smoker quit 2012 (46 pack year hx). PMH significant for COPD GROUP D, chronic hypoxemic respiratory failure, sinusitis, cirrhosis of the liver, type 2 diabetes, urothelial cancer, COVID-19 May 2019. Patient of Dr. Ellison. Maintained on Anoro Ellipta.   Previous [facility] pulmonary encounters: 11/02/2019 Patient contacted today for acute televisit. Reports increased mucus production, described as thick clear sputum x 1 week. She has associated some nasal congestion. Breathing is fine, she has not needed to use Albuterol rescue inhaler or nebulizer. Not current taking anything for cough. Uses flonsae nasal daily, tried saline ocean nasal spray once. She was recently started on oxygen at night when she noticed increase in her congestion. Denies chest tightness, shortness of breath, back pain or chest pain.  12/20/2019 Patient presents today for regular follow-up. She reports continued congestion mainly in her upper throat. She had no improvement with mucinex or abx/prednisone. She is able to get up some mucus which is clear. Congested started shortly after getting oxygen. She has humidification. She is using flonase at night. She takes generic zyrtec. She is not taking lasix, it is prescribed as needed and she has not had any swelling. She has some nasal congestion, post nasal drip and right sided sinus pressure. She has associated vertigo since a fall earlier this year. She wears 2-2.5L at night. She needs sample of Anoro and needs help with patient assistance. Denies shortness of breath.   Allergies  Allergen Reactions  . Contrast Media [Iodinated Diagnostic Agents] Anaphylaxis, Shortness Of Breath and Other (See Comments)    Could not breath  . Iohexol  Anaphylaxis, Shortness Of Breath and Other (See Comments)    Immediately could not breathe  . Lisinopril Anaphylaxis, Shortness Of Breath and Rash  . Sulfa Antibiotics Anaphylaxis and Other (See Comments)    Historical from mother, pt states that mother says she almost died from this drug  . Latex Other (See Comments)    Unless against on skin for a long time, blisters. Short term is okay.  . Codeine Nausea Only, Anxiety and Other (See Comments)    insomnia  . Levofloxacin Other (See Comments)    insomnia  . Lipitor [Atorvastatin Calcium] Rash  . Vicodin [Hydrocodone-Acetaminophen] Itching    Immunization History  Administered Date(s) Administered  . Fluad Quad(high Dose 65+) 04/07/2019  . Influenza Split 05/28/2011  . Influenza, High Dose Seasonal PF 04/07/2013, 05/15/2014, 05/30/2015, 03/25/2016, 03/05/2017, 04/01/2018  . Influenza, Seasonal, Injecte, Preservative Fre 06/07/2012  . PFIZER SARS-COV-2 Vaccination 08/20/2019, 09/13/2019  . Pneumococcal Conjugate-13 07/06/2014  . Pneumococcal Polysaccharide-23 12/15/2004, 05/28/2011  . Tdap 02/23/2008, 06/15/2018  . Zoster 05/17/2010  . Zoster Recombinat (Shingrix) 12/02/2017, 02/06/2018    Past Medical History:  Diagnosis Date  . Aortic stenosis    mild-mod by 04/08/16 echo; mod AS 04/2017  . Arthritis    "hands" (05/15/2017)  . Bladder cancer (HCC) 2018  . Breast cancer, right (HCC) 1992   DCIS,bladder ca (just dx)  . Colon polyp   . Complication of anesthesia 1992   "local anesthesia" used was hard to awaken from-no problems since (05/15/2017)  . COPD (chronic obstructive pulmonary disease) (HCC)   . COVID-19 virus infection 05/05/2019  . Diverticulosis   . Dyspnea   .   Elevated cholesterol   . Elevated liver enzymes    fatty liver per ultrasound per pt  . Environmental allergies    "I have allergies year round" (05/15/2017)  . Family history of malignant neoplasm of breast   . FHx: BRCA2 gene positive    sister  with BRCA2 mutation (pt tested NEGATIVE)  . Genital HSV    gets on hip  . Heart murmur     echo 05/2011 mild aortic stenosis  . HSV (herpes simplex virus) infection    on hip--on daily suppression  . Hypertension   . Hypothyroidism    took med 7 yrs after birth of 1st child  . Impaired glucose tolerance   . Migraine   . On home oxygen therapy    "have it available but I'm not using it" (05/15/2017)  . Osteopenia   . Pneumonia ~ 1950; 08/06/2010  . Type 2 diabetes mellitus ([place])   . Ventral hernia   . Vitamin D deficiency disease     Tobacco History: Social History   Tobacco Use  Smoking Status Former Smoker  . Packs/day: 1.00  . Years: 46.00  . Pack years: 46.00  . Types: Cigarettes  . Quit date: 05/31/2011  . Years since quitting: 8.5  Smokeless Tobacco Never Used   Counseling given: Not Answered   Outpatient Medications Prior to Visit  Medication Sig Dispense Refill  . ACCU-CHEK AVIVA PLUS test strip TEST BLOOD SUGAR ONE TO TWO TIMES DAILY 100 each 2  . acetaminophen (TYLENOL) 500 MG tablet Take 1,000 mg every 6 (six) hours as needed by mouth for moderate pain or headache.     . albuterol (PROVENTIL) (2.5 MG/3ML) 0.083% nebulizer solution USE 1 VIAL BY NEBULIZATION EVERY FOUR HOURS AS NEEDED FOR WHEEZING OR SHORTNESS OF BREATH. 30 mL 0  . albuterol (VENTOLIN HFA) 108 (90 Base) MCG/ACT inhaler Inhale 2 puffs into the lungs every 6 (six) hours as needed for wheezing or shortness of breath. 8 g 5  . aspirin EC 81 MG tablet Take 81 mg daily by mouth.    . bisoprolol-hydrochlorothiazide (ZIAC) 10-6.25 MG tablet Take 1 tablet by mouth daily. 90 tablet 1  . CALCIUM-MAGNESIUM-VITAMIN D PO Take 1 tablet by mouth in the morning and at bedtime.     . cetirizine (KLS ALLER-TEC) 10 MG tablet Take 10 mg by mouth daily.    . fluticasone (FLONASE) 50 MCG/ACT nasal spray USE 2 SPRAYS IN EACH NOSTRIL EVERY DAY 48 g 0  . furosemide (LASIX) 20 MG tablet TAKE 1 TABLET (20 MG TOTAL) BY MOUTH  DAILY AS NEEDED FOR FLUID OR EDEMA. 30 tablet 0  . metFORMIN (GLUCOPHAGE-XR) 500 MG 24 hr tablet Take 1 tablet (500 mg total) by mouth 3 (three) times daily. 270 tablet 1  . simvastatin (ZOCOR) 20 MG tablet Take 1 tablet (20 mg total) by mouth at bedtime. 90 tablet 1  . triamcinolone cream (KENALOG) 0.1 % Apply 1 application topically 2 (two) times daily as needed (redness).     . TRUEplus Lancets 30G MISC TEST BLOOD SUGAR ONE TO TWO TIMES DAILY 200 each 0  . umeclidinium-vilanterol (ANORO ELLIPTA) 62.5-25 MCG/INH AEPB Inhale 1 puff into the lungs daily. 180 each 3  . doxycycline (VIBRA-TABS) 100 MG tablet Take 1 tablet (100 mg total) by mouth 2 (two) times daily. 14 tablet 0  . predniSONE (DELTASONE) 10 MG tablet Take 4 tabs po daily x 2 days; then 3 tabs for 2 days; then 2 tabs for 2  days; then 1 tab for 2 days 20 tablet 0   No facility-administered medications prior to visit.   Review of Systems  Review of Systems  HENT: Positive for congestion, postnasal drip and sinus pressure.   Respiratory: Positive for cough. Negative for chest tightness, shortness of breath and wheezing.    Physical Exam  BP 130/70 (BP Location: Left Arm, Cuff Size: Normal)   Pulse 72   Temp (!) 97.2 F (36.2 C) (Oral)   Ht 5' 4" (1.626 m)   Wt 210 lb 6.4 oz (95.4 kg)   LMP  (LMP Unknown)   SpO2 95%   BMI 36.12 kg/m  Physical Exam Constitutional:      Appearance: Normal appearance.  HENT:     Head: Normocephalic and atraumatic.     Mouth/Throat:     Mouth: Mucous membranes are moist.     Pharynx: Oropharynx is clear.  Cardiovascular:     Rate and Rhythm: Normal rate and regular rhythm.  Pulmonary:     Effort: Pulmonary effort is normal.     Breath sounds: Normal breath sounds. No wheezing or rhonchi.     Comments: Lungs clear to auscultation. Obvious upper airway congestion, frequently clearing her throat Neurological:     General: No focal deficit present.     Mental Status: She is alert and  oriented to person, place, and time. Mental status is at baseline.  Psychiatric:        Mood and Affect: Mood normal.        Behavior: Behavior normal.        Thought Content: Thought content normal.        Judgment: Judgment normal.      Lab Results:  CBC    Component Value Date/Time   WBC 4.2 10/03/2019 0850   WBC 4.2 08/30/2019 0351   RBC 4.27 10/03/2019 0850   RBC 2.99 (L) 08/30/2019 0351   HGB 12.2 10/03/2019 0850   HCT 38.5 10/03/2019 0850   PLT 155 10/03/2019 0850   MCV 90 10/03/2019 0850   MCH 28.6 10/03/2019 0850   MCH 28.8 08/30/2019 0351   MCHC 31.7 10/03/2019 0850   MCHC 30.7 08/30/2019 0351   RDW 14.5 10/03/2019 0850   LYMPHSABS 1.1 10/03/2019 0850   MONOABS 0.4 08/30/2019 0351   EOSABS 0.1 10/03/2019 0850   BASOSABS 0.0 10/03/2019 0850    BMET    Component Value Date/Time   NA 142 10/03/2019 0850   K 4.0 10/03/2019 0850   CL 103 10/03/2019 0850   CO2 25 10/03/2019 0850   GLUCOSE 115 (H) 10/03/2019 0850   GLUCOSE 125 (H) 08/30/2019 0351   BUN 16 10/03/2019 0850   CREATININE 0.58 10/03/2019 0850   CREATININE 0.75 03/05/2017 0736   CALCIUM 9.8 10/03/2019 0850   GFRNONAA 92 10/03/2019 0850   GFRAA 106 10/03/2019 0850    BNP    Component Value Date/Time   BNP 75.1 09/29/2017 1404   BNP 35.1 08/27/2015 0001    ProBNP    Component Value Date/Time   PROBNP 159.7 (H) 06/12/2011 0615    Imaging: MM 3D SCREEN BREAST UNI LEFT  Result Date: 12/13/2019 CLINICAL DATA:  Screening. EXAM: DIGITAL SCREENING UNILATERAL LEFT MAMMOGRAM WITH CAD AND TOMO COMPARISON:  Previous exam(s). ACR Breast Density Category c: The breast tissue is heterogeneously dense, which may obscure small masses. FINDINGS: The patient has had a right mastectomy. There are no findings suspicious for malignancy. Images were processed with CAD. IMPRESSION: No mammographic  evidence of malignancy. A result letter of this screening mammogram will be mailed directly to the patient.  RECOMMENDATION: Screening mammogram in one year.  (Code:SM-L-12M) BI-RADS CATEGORY  1: Negative. Electronically Signed   By: Nancy  Ballantyne M.D.   On: 12/13/2019 17:00     Assessment & Plan:   COPD, group B, by GOLD 2017 classification (HCC) - Stable interval, no shortness of breath or wheezing  - Provided Anoro samples today and given patient assistance paper work to fill out ' - Follow-up: 2-3 months with Dr. Ellison   Upper airway cough syndrome - Continues to have upper airway cough/congestion, PND likely driving factor here  - Encourage patient to use Saline nasal spray twice daily and flonase nasal spray - Adding Singulair 10mg at bedtime  - Checking CXR today  Sinusitis - Refer back to ENT    Elizabeth W Walsh, NP 12/20/2019  

## 2019-12-20 NOTE — Assessment & Plan Note (Signed)
-   Refer back to ENT

## 2019-12-20 NOTE — Assessment & Plan Note (Addendum)
-   Continues to have upper airway cough/congestion, PND likely driving factor here  - Encourage patient to use Saline nasal spray twice daily and flonase nasal spray - Adding Singulair 10mg  at bedtime  - Checking CXR today

## 2019-12-20 NOTE — Patient Instructions (Addendum)
Recommendations: Saline nasal spray twice daily Flonase nasal spray Continue Anoro 1 puff daily (sample given, paper work for patient assistance) Rx Singulair 10mg  at bedtime   Orders: CXR today   Refer: ENT re: sinusitis, vertigo   Follow-up: 2-3 months with Dr. Loanne Drilling

## 2019-12-21 ENCOUNTER — Telehealth: Payer: Self-pay | Admitting: Family Medicine

## 2019-12-21 DIAGNOSIS — E7800 Pure hypercholesterolemia, unspecified: Secondary | ICD-10-CM

## 2019-12-21 DIAGNOSIS — E119 Type 2 diabetes mellitus without complications: Secondary | ICD-10-CM

## 2019-12-21 DIAGNOSIS — M6281 Muscle weakness (generalized): Secondary | ICD-10-CM | POA: Diagnosis not present

## 2019-12-21 DIAGNOSIS — R262 Difficulty in walking, not elsewhere classified: Secondary | ICD-10-CM | POA: Diagnosis not present

## 2019-12-21 DIAGNOSIS — S43004A Unspecified dislocation of right shoulder joint, initial encounter: Secondary | ICD-10-CM | POA: Diagnosis not present

## 2019-12-21 DIAGNOSIS — J449 Chronic obstructive pulmonary disease, unspecified: Secondary | ICD-10-CM | POA: Diagnosis not present

## 2019-12-21 DIAGNOSIS — S72141S Displaced intertrochanteric fracture of right femur, sequela: Secondary | ICD-10-CM | POA: Diagnosis not present

## 2019-12-21 MED ORDER — ACCU-CHEK SOFTCLIX LANCETS MISC: 1 refills | Status: DC

## 2019-12-21 MED ORDER — SIMVASTATIN 20 MG PO TABS: 20.0000 mg | ORAL_TABLET | Freq: Every day | ORAL | 0 refills | Status: DC

## 2019-12-21 MED ORDER — ACCU-CHEK AVIVA PLUS VI STRP: ORAL_STRIP | 1 refills | Status: DC

## 2019-12-21 MED ORDER — BD SWAB SINGLE USE REGULAR PADS: MEDICATED_PAD | 1 refills | Status: AC

## 2019-12-21 NOTE — Progress Notes (Signed)
CXR showed emphysema and chronic interstitial prominence. No acute process  We should get CT chest wo contrast to better evaluate these markings. Please order re: cough

## 2019-12-21 NOTE — Telephone Encounter (Signed)
[employer] REQ BD SINGLE USE SWAB

## 2019-12-21 NOTE — Telephone Encounter (Signed)
[employer] req Simvastatin 20 mg, accu-chek aviva plus test, accu check softclix lancets.

## 2019-12-21 NOTE — Telephone Encounter (Signed)
Sent in swabs

## 2019-12-21 NOTE — Telephone Encounter (Signed)
Sent in meds 

## 2019-12-27 ENCOUNTER — Other Ambulatory Visit: Payer: Self-pay | Admitting: Family Medicine

## 2019-12-27 DIAGNOSIS — E7800 Pure hypercholesterolemia, unspecified: Secondary | ICD-10-CM

## 2020-01-03 DIAGNOSIS — S42254A Nondisplaced fracture of greater tuberosity of right humerus, initial encounter for closed fracture: Secondary | ICD-10-CM | POA: Diagnosis not present

## 2020-01-20 DIAGNOSIS — S43004A Unspecified dislocation of right shoulder joint, initial encounter: Secondary | ICD-10-CM | POA: Diagnosis not present

## 2020-01-20 DIAGNOSIS — S72141S Displaced intertrochanteric fracture of right femur, sequela: Secondary | ICD-10-CM | POA: Diagnosis not present

## 2020-01-20 DIAGNOSIS — J449 Chronic obstructive pulmonary disease, unspecified: Secondary | ICD-10-CM | POA: Diagnosis not present

## 2020-01-20 DIAGNOSIS — R262 Difficulty in walking, not elsewhere classified: Secondary | ICD-10-CM | POA: Diagnosis not present

## 2020-01-20 DIAGNOSIS — M6281 Muscle weakness (generalized): Secondary | ICD-10-CM | POA: Diagnosis not present

## 2020-01-23 ENCOUNTER — Encounter (INDEPENDENT_AMBULATORY_CARE_PROVIDER_SITE_OTHER): Payer: Self-pay | Admitting: Otolaryngology

## 2020-01-23 ENCOUNTER — Ambulatory Visit (INDEPENDENT_AMBULATORY_CARE_PROVIDER_SITE_OTHER): Payer: Medicare HMO | Admitting: Otolaryngology

## 2020-01-23 ENCOUNTER — Other Ambulatory Visit: Payer: Self-pay

## 2020-01-23 VITALS — Temp 97.7°F

## 2020-01-23 DIAGNOSIS — J310 Chronic rhinitis: Secondary | ICD-10-CM

## 2020-01-23 DIAGNOSIS — H8112 Benign paroxysmal vertigo, left ear: Secondary | ICD-10-CM | POA: Diagnosis not present

## 2020-01-23 DIAGNOSIS — H6983 Other specified disorders of Eustachian tube, bilateral: Secondary | ICD-10-CM

## 2020-01-23 NOTE — Progress Notes (Signed)
HPI: Shannon Schultz is a 74 y.o. female who presents is referred by Geraldo Pitter, NP for evaluation of vertigo and sinusitis..  Patient describes a lot of congestion in her head which is been worse since she started using home O2 at night.  She has also been having a chronic cough since April that her doctor thought might be related to sinus infection. Her cough is generally nonproductive.  The drainage from her nose is always been clear as she has had no yellow-green discharge from her nose. She has been treated with 2 rounds of antibiotics with no benefit from the antibiotics.  She has also been tried on Mucinex as well as Flonase but has not been using this regularly. She also complains of clogged hearing and difficulty clearing her ears.  Past Medical History:  Diagnosis Date  . Aortic stenosis    mild-mod by 04/08/16 echo; mod AS 04/2017  . Arthritis    "hands" (05/15/2017)  . Bladder cancer ([place]) 2018  . Breast cancer, right ([place]) 1992   DCIS,bladder ca (just dx)  . Colon polyp   . Complication of anesthesia 1992   "local anesthesia" used was hard to awaken from-no problems since (05/15/2017)  . COPD (chronic obstructive pulmonary disease) ([place])   . COVID-19 virus infection 05/05/2019  . Diverticulosis   . Dyspnea   . Elevated cholesterol   . Elevated liver enzymes    fatty liver per ultrasound per pt  . Environmental allergies    "I have allergies year round" (05/15/2017)  . Family history of malignant neoplasm of breast   . FHx: BRCA2 gene positive    sister with BRCA2 mutation (pt tested NEGATIVE)  . Genital HSV    gets on hip  . Heart murmur     echo 05/2011 mild aortic stenosis  . HSV (herpes simplex virus) infection    on hip--on daily suppression  . Hypertension   . Hypothyroidism    took med 7 yrs after birth of 1st child  . Impaired glucose tolerance   . Migraine   . On home oxygen therapy    "have it available but I'm not using it" (05/15/2017)  .  Osteopenia   . Pneumonia ~ 1950; 08/06/2010  . Type 2 diabetes mellitus ([place])   . Ventral hernia   . Vitamin D deficiency disease    Past Surgical History:  Procedure Laterality Date  . ABDOMINAL HERNIA REPAIR  05/15/2017  . BREAST BIOPSY Right 1992  . BREAST BIOPSY Left 2018  . BREAST EXCISIONAL BIOPSY Left 2018   ATYPICAL DUCTAL HYPERPLASIA INVOLVING A COMPLEX  . BREAST LUMPECTOMY WITH RADIOACTIVE SEED LOCALIZATION Left 12/22/2016   Procedure: LEFT BREAST LUMPECTOMY WITH RADIOACTIVE SEED LOCALIZATION;  Surgeon: Jovita Kussmaul, MD;  Location: [facility];  Service: General;  Laterality: Left;  . CATARACT EXTRACTION, BILATERAL Bilateral R 03/16/2019 L 03/30/2019   Dr. Kathlen Mody  . COLONOSCOPY  2009, 08/2010, 12/2015   Dr. Collene Mares; "only 1 had any polyps" (05/15/2017)  . CYSTOSCOPY  04/23/2017  . CYSTOSCOPY W/ RETROGRADES Bilateral 01/12/2017   Procedure: CYSTOSCOPY WITH RETROGRADE PYELOGRAM/ EXAM UNDER ANESTHESIA;  Surgeon: Raynelle Bring, MD;  Location: [facility];  Service: Urology;  Laterality: Bilateral;  . FEMUR IM NAIL Right 08/24/2019   Procedure: INTRAMEDULLARY (IM) NAIL FEMORAL;  Surgeon: Rod Can, MD;  Location: [facility];  Service: Orthopedics;  Laterality: Right;  . HERNIA REPAIR    . INSERTION OF MESH N/A 05/15/2017   Procedure: INSERTION OF MESH;  Surgeon: Jovita Kussmaul, MD;  Location[facility];  Service: General;  Laterality: N/A;  . LAPAROSCOPIC CHOLECYSTECTOMY  2003  . MASTECTOMY Right 1992  . TRANSURETHRAL RESECTION OF BLADDER TUMOR WITH MITOMYCIN-C N/A 01/12/2017   Procedure: TRANSURETHRAL RESECTION OF BLADDER TUMOR WITH POSSIBLE POST OPERATIVE INSTILLATION OF MITOMYCIN-C;  Surgeon: Raynelle Bring, MD;  Location: [facility];  Service: Urology;  Laterality: N/A;  . TYMPANOSTOMY TUBE PLACEMENT Bilateral 1980s  . UMBILICAL HERNIA REPAIR  2003  . VENTRAL HERNIA REPAIR N/A 05/15/2017   Procedure: VENTRAL HERNIA REPAIR WITH MESH;  Surgeon: Jovita Kussmaul, MD;  Location: [facility];  Service: General;   Laterality: N/A;   Social History   Socioeconomic History  . Marital status: Married    Spouse name: Not on file  . Number of children: 3  . Years of education: Not on file  . Highest education level: Not on file  Occupational History  . Occupation: Retired  Tobacco Use  . Smoking status: Former Smoker    Packs/day: 1.00    Years: 46.00    Pack years: 46.00    Types: Cigarettes    Quit date: 05/31/2011    Years since quitting: 8.6  . Smokeless tobacco: Never Used  Vaping Use  . Vaping Use: Never used  Substance and Sexual Activity  . Alcohol use: No  . Drug use: No  . Sexual activity: Yes    Partners: Male    Birth control/protection: Post-menopausal  Other Topics Concern  . Not on file  Social History Narrative   Lives with her husband.  Children all live in [place] nearby. No pets. 6 grandchildren.   Social Determinants of Health   Financial Resource Strain:   . Difficulty of Paying Living Expenses:   Food Insecurity:   . Worried About Charity fundraiser in the Last Year:   . Arboriculturist in the Last Year:   Transportation Needs:   . Film/video editor (Medical):   Marland Kitchen Lack of Transportation (Non-Medical):   Physical Activity:   . Days of Exercise per Week:   . Minutes of Exercise per Session:   Stress:   . Feeling of Stress :   Social Connections:   . Frequency of Communication with Friends and Family:   . Frequency of Social Gatherings with Friends and Family:   . Attends Religious Services:   . Active Member of Clubs or Organizations:   . Attends Archivist Meetings:   Marland Kitchen Marital Status:    Family History  Problem Relation Age of Onset  . Heart disease Father   . Diabetes Father   . Hypertension Father   . Asthma Sister   . Allergies Sister   . Hyperlipidemia Sister   . Hashimoto's thyroiditis Sister   . Fibromyalgia Sister   . Heart disease Mother        tachycardia  . Asthma Sister   . Allergies Sister   . Hyperlipidemia Sister    . Hashimoto's thyroiditis Sister   . Fibromyalgia Sister   . Breast cancer Sister 17       BRCA2 positive; metastatic to bones at age 32, then spread to liver  . Cancer Other        female cancers, bone cancer  . Cancer Maternal Grandmother        deceased 33; unk. primary; possibly stomach  . Tuberculosis Maternal Grandfather   . Stroke Paternal Grandmother 40       died of  cerebral hemorrhage   Allergies  Allergen Reactions  . Contrast Media [Iodinated Diagnostic Agents] Anaphylaxis, Shortness Of Breath and Other (See Comments)    Could not breath  . Iohexol Anaphylaxis, Shortness Of Breath and Other (See Comments)    Immediately could not breathe  . Lisinopril Anaphylaxis, Shortness Of Breath and Rash  . Sulfa Antibiotics Anaphylaxis and Other (See Comments)    Historical from mother, pt states that mother says she almost died from this drug  . Latex Other (See Comments)    Unless against on skin for a long time, blisters. Short term is okay.  . Codeine Nausea Only, Anxiety and Other (See Comments)    insomnia  . Levofloxacin Other (See Comments)    insomnia  . Lipitor [Atorvastatin Calcium] Rash  . Vicodin [Hydrocodone-Acetaminophen] Itching   Prior to Admission medications   Medication Sig Start Date End Date Taking? Authorizing Provider  Accu-Chek Softclix Lancets lancets Test BS 1-2 times daily. Pt uses accu-chek meter 12/21/19  Yes Henson, Vickie L, NP-C  acetaminophen (TYLENOL) 500 MG tablet Take 1,000 mg every 6 (six) hours as needed by mouth for moderate pain or headache.    Yes [provider]  albuterol (PROVENTIL) (2.5 MG/3ML) 0.083% nebulizer solution USE 1 VIAL BY NEBULIZATION EVERY FOUR HOURS AS NEEDED FOR WHEEZING OR SHORTNESS OF BREATH. 05/06/19  Yes Tysinger, Camelia Eng, PA-C  albuterol (VENTOLIN HFA) 108 (90 Base) MCG/ACT inhaler Inhale 2 puffs into the lungs every 6 (six) hours as needed for wheezing or shortness of breath. 03/28/19  Yes [name], MD  Alcohol Swabs (B-D SINGLE USE SWABS REGULAR) PADS Use twice a day when checking blood sugars 12/21/19  Yes [name], MD  aspirin EC 81 MG tablet Take 81 mg daily by mouth.   Yes [provider]  bisoprolol-hydrochlorothiazide (ZIAC) 10-6.25 MG tablet Take 1 tablet by mouth daily. 10/06/19  Yes [name], MD  CALCIUM-MAGNESIUM-VITAMIN D PO Take 1 tablet by mouth in the morning and at bedtime.    Yes [provider]  cetirizine (KLS ALLER-TEC) 10 MG tablet Take 10 mg by mouth daily.   Yes [provider]  fluticasone (FLONASE) 50 MCG/ACT nasal spray USE 2 SPRAYS IN EACH NOSTRIL EVERY DAY 01/11/18  Yes [name], MD  furosemide (LASIX) 20 MG tablet TAKE 1 TABLET (20 MG TOTAL) BY MOUTH DAILY AS NEEDED FOR FLUID OR EDEMA. 10/27/17  Yes [name], MD  glucose blood (ACCU-CHEK AVIVA PLUS) test strip TEST BLOOD SUGAR ONE TO TWO TIMES DAILY 12/21/19  Yes Henson, Vickie L, NP-C  metFORMIN (GLUCOPHAGE-XR) 500 MG 24 hr tablet Take 1 tablet (500 mg total) by mouth 3 (three) times daily. 10/06/19  Yes [name], MD  montelukast (SINGULAIR) 10 MG tablet Take 1 tablet (10 mg total) by mouth at bedtime. 12/20/19  Yes Martyn Ehrich, NP  simvastatin (ZOCOR) 20 MG tablet TAKE 1 TABLET AT BEDTIME 12/28/19  Yes [name], MD  triamcinolone cream (KENALOG) 0.1 % Apply 1 application topically 2 (two) times daily as needed (redness).  12/07/18  Yes [provider]  TRUEplus Lancets 30G MISC TEST BLOOD SUGAR ONE TO TWO TIMES DAILY 04/07/19  Yes [name], MD  umeclidinium-vilanterol [facility] ELLIPTA) 62.5-25 MCG/INH AEPB Inhale 1 puff into the lungs daily. 07/22/19  Yes [name], MD     Positive ROS: Otherwise negative  All other systems have been reviewed and were otherwise negative with the exception of those mentioned in the  HPI and as above.  Physical Exam: Constitutional: Alert, well-appearing, no acute distress Ears: External ears without lesions or tenderness.  Ear canals are clear bilaterally.  On microscopic exam the right TM is retracted but I was able to insufflate some air behind the right TM.  Left TM is slightly retracted.  No middle ear effusion noted.  On Dix-Hallpike testing patient had one small episode of vertigo that lasted for seconds with head turned to the left. Nasal: External nose without lesions. Septum with minimal deformity.  On anterior rhinoscopy nasal passages were clear with only clear mucus within the nasal cavity.  Nasal endoscopy was performed in the office today and on nasal endoscopy the middle meatus regions were clear bilaterally posterior ethmoid area was clear.  Sphenoid sinus region was clear.  The nasopharynx and eustachian tube area was unobstructed. Oral: Lips and gums without lesions. Tongue and palate mucosa without lesions. Posterior oropharynx clear. Neck: No palpable adenopathy or masses Respiratory: Breathing comfortably  Skin: No facial/neck lesions or rash noted.  Nasal/sinus endoscopy  Date/Time: 01/23/2020 7:15 PM Performed by: Rozetta Nunnery, MD Authorized by: Rozetta Nunnery, MD   Consent:    Consent obtained:  Verbal   Consent given by:  Patient Procedure details:    Indications: sino-nasal symptoms     Medication:  Afrin   Instrument: flexible fiberoptic nasal endoscope     Scope location: bilateral nare   Sinus:    Right middle meatus: normal     Left middle meatus: normal     Right nasopharynx: normal     Left nasopharynx: normal     Right Eustachian tube orifices: normal     Left Eustachian tube orifices: normal   Comments:     On nasal endoscopy the sinus regions appeared clear with no obvious mucopurulent discharge or evidence of infection.  She did have mild to moderate rhinitis and would recommend use of nasal steroid spray    Assessment: Chronic rhinitis.  No clinical evidence of active sinus infection. Eustachian tube dysfunction Questionable  BPPV.  Plan: Recommended regular use of nasal steroid spray Flonase or Nasacort 2 sprays each nostril at night.  This should help with nasal sinus pressure as well as eustachian tube function. I also gave her information on Epley maneuver and BPPV.   Radene Journey, MD   CC:

## 2020-02-02 ENCOUNTER — Other Ambulatory Visit: Payer: Self-pay

## 2020-02-02 ENCOUNTER — Encounter: Payer: Self-pay | Admitting: Family Medicine

## 2020-02-02 ENCOUNTER — Ambulatory Visit (INDEPENDENT_AMBULATORY_CARE_PROVIDER_SITE_OTHER): Payer: Medicare HMO | Admitting: Family Medicine

## 2020-02-02 VITALS — BP 120/72 | HR 72 | Temp 97.6°F | Wt 213.8 lb

## 2020-02-02 DIAGNOSIS — M533 Sacrococcygeal disorders, not elsewhere classified: Secondary | ICD-10-CM | POA: Diagnosis not present

## 2020-02-02 NOTE — Progress Notes (Signed)
   Subjective:    Patient ID: Shannon Schultz, female    DOB: [birth year], 74 y.o.   MRN: [id number]  HPI She is here for evaluation of possible pressure sore.  She has been forced to lay flat on her back because of hip and shoulder discomfort.  Mainly from the hip fracture and subsequent unresolved pain.  She complains of a painful lesion in the sacral area.   Review of Systems     Objective:   Physical Exam Alert and complaining of hip pain as well as sacral discomfort.  Visual exam does show a healing 3 cm linear lesion with no surrounding erythema warmth tenderness or induration.       Assessment & Plan:  Sacral pain I explained that there was no evidence of pressure sore but did discuss the use of an eggcrate type mattress to take some of the pressure off of that.  She was comfortable with

## 2020-02-20 DIAGNOSIS — S43004A Unspecified dislocation of right shoulder joint, initial encounter: Secondary | ICD-10-CM | POA: Diagnosis not present

## 2020-02-20 DIAGNOSIS — R262 Difficulty in walking, not elsewhere classified: Secondary | ICD-10-CM | POA: Diagnosis not present

## 2020-02-20 DIAGNOSIS — M6281 Muscle weakness (generalized): Secondary | ICD-10-CM | POA: Diagnosis not present

## 2020-02-20 DIAGNOSIS — J449 Chronic obstructive pulmonary disease, unspecified: Secondary | ICD-10-CM | POA: Diagnosis not present

## 2020-02-20 DIAGNOSIS — S72141S Displaced intertrochanteric fracture of right femur, sequela: Secondary | ICD-10-CM | POA: Diagnosis not present

## 2020-03-12 ENCOUNTER — Telehealth: Payer: Self-pay | Admitting: Pulmonary Disease

## 2020-03-12 NOTE — Telephone Encounter (Signed)
Spoke with patient regarding prior message for [facility] assistance.Patient wanted to know what was the limit of income for patient assistance.Patient stated she will  Drop off completed forms to our office today or in the morning . Nothing else further needed.

## 2020-03-20 MED ORDER — MONTELUKAST SODIUM 10 MG PO TABS: 10.0000 mg | ORAL_TABLET | Freq: Every day | ORAL | 11 refills | Status: DC

## 2020-03-20 NOTE — Telephone Encounter (Signed)
BW please advise on pt message   I was prescribed these tablets on June 22 and took the last one last night.  Need to know if I should refill the prescription.   Pt is referring to montelukast which was ordered on 11/02/19 for 10 days and refilled on 12/20/19.  Thank you

## 2020-03-21 ENCOUNTER — Other Ambulatory Visit: Payer: Self-pay | Admitting: Family Medicine

## 2020-03-21 DIAGNOSIS — E119 Type 2 diabetes mellitus without complications: Secondary | ICD-10-CM

## 2020-03-22 NOTE — Telephone Encounter (Signed)
If tolerating medication, yes ok to refill

## 2020-03-26 ENCOUNTER — Ambulatory Visit (INDEPENDENT_AMBULATORY_CARE_PROVIDER_SITE_OTHER)
Admission: RE | Admit: 2020-03-26 | Discharge: 2020-03-26 | Disposition: A | Discharge status: Home / Self Care | Payer: Medicare HMO | Source: Ambulatory Visit | Attending: Acute Care | Admitting: Acute Care

## 2020-03-26 ENCOUNTER — Other Ambulatory Visit: Payer: Self-pay

## 2020-03-26 DIAGNOSIS — Z122 Encounter for screening for malignant neoplasm of respiratory organs: Secondary | ICD-10-CM

## 2020-03-26 DIAGNOSIS — Z87891 Personal history of nicotine dependence: Secondary | ICD-10-CM

## 2020-03-29 NOTE — Progress Notes (Signed)
Please call patient and let them  know their  low dose Ct was read as a Lung RADS 2: nodules that are benign in appearance and behavior with a very low likelihood of becoming a clinically active cancer due to size or lack of growth. Recommendation per radiology is for a repeat LDCT in 12 months. .Please let them  know we will order and schedule their  annual screening scan for 02/2021. Please let them  know there was notation of CAD on their  scan.  Please remind the patient  that this is a non-gated exam therefore degree or severity of disease  cannot be determined. Please have them  follow up with their PCP regarding potential risk factor modification, dietary therapy or pharmacologic therapy if clinically indicated. Pt.  is  currently on statin therapy. Please place order for annual  screening scan for  02/2021 and fax results to PCP. Thanks so much. 

## 2020-03-30 ENCOUNTER — Other Ambulatory Visit: Payer: Self-pay | Admitting: *Deleted

## 2020-03-30 ENCOUNTER — Ambulatory Visit (INDEPENDENT_AMBULATORY_CARE_PROVIDER_SITE_OTHER): Payer: Medicare HMO | Admitting: Pulmonary Disease

## 2020-03-30 ENCOUNTER — Other Ambulatory Visit: Payer: Self-pay

## 2020-03-30 ENCOUNTER — Telehealth: Payer: Self-pay | Admitting: Pulmonary Disease

## 2020-03-30 ENCOUNTER — Encounter: Payer: Self-pay | Admitting: Pulmonary Disease

## 2020-03-30 VITALS — BP 126/72 | HR 64 | Temp 97.6°F | Ht 64.0 in | Wt 219.2 lb

## 2020-03-30 DIAGNOSIS — J449 Chronic obstructive pulmonary disease, unspecified: Secondary | ICD-10-CM | POA: Diagnosis not present

## 2020-03-30 DIAGNOSIS — G4734 Idiopathic sleep related nonobstructive alveolar hypoventilation: Secondary | ICD-10-CM | POA: Diagnosis not present

## 2020-03-30 DIAGNOSIS — Z87891 Personal history of nicotine dependence: Secondary | ICD-10-CM

## 2020-03-30 DIAGNOSIS — G4733 Obstructive sleep apnea (adult) (pediatric): Secondary | ICD-10-CM | POA: Diagnosis not present

## 2020-03-30 MED ORDER — ANORO ELLIPTA 62.5-25 MCG/INH IN AEPB: 1.0000 | INHALATION_SPRAY | Freq: Every day | RESPIRATORY_TRACT | 3 refills | Status: DC

## 2020-03-30 NOTE — Telephone Encounter (Signed)
Spoke with patient. She stated that she dropped off the forms about 2-3 weeks ago. She was under the impression the forms would be faxed on the same day as well as the prescription.   I checked JE's inbox as well as the fax number, did not see forms for patient. Forms have not been scanned into her chart yet. Will continue to look out for forms.

## 2020-03-30 NOTE — Progress Notes (Signed)
Subjective:   PATIENT ID: Shannon Schultz GENDER: female DOB: 01/02/1946, MRN: [id number]   HPI  Chief Complaint  Patient presents with  . Follow-up    follow up.  breathing has been doing well.  no issues.  [employer] will not cover anoro now and if she does not get pt assistance she will need a different rx.    Shannon Schultz is a 74 year old female with moderate COPD  During 2018,  she lost her sister to breast cancer in early 2018, was diagnosed with breast cancer s/p lumpectomy, had bladder cancer and underwent resection and intravesicular chemo, fell and broke her right shoulder, underwent ventral hernia repair with mesh insertion. Last hospitalized for 2 days in April 2019 with COPD exacerbation and lower extremity edema.  Interim: 03/30/20 Overall she reports her breathing is controlled. No exacerbations or steroids since our last visit. Does not routinely need rescue inhaler. Compliant Anoro and reports this keeps her prior symptoms of shortness of breath and wheezing under control.  When she was seen by NP Volanda Napoleon, she was referred to ENT for sinusitis. On evaluation, it was thought to be allergies. She is on nocturnal oxygen and compliant with therapy. She had been previously scheduled for in-lab sleep titration however unable to complete due to being positive for covid (asymptomatic) at the time. Currently not interested in using CPAP  Social History: 46 pack years. Quit in 2012.  I have personally reviewed patient's past medical/family/social history/allergies/current medications.  Outpatient Medications Prior to Visit  Medication Sig Dispense Refill  . Accu-Chek Softclix Lancets lancets Test BS 1-2 times daily. Pt uses accu-chek meter 100 each 1  . acetaminophen (TYLENOL) 500 MG tablet Take 1,000 mg every 6 (six) hours as needed by mouth for moderate pain or headache.     . albuterol (PROVENTIL) (2.5 MG/3ML) 0.083% nebulizer solution USE 1 VIAL BY NEBULIZATION EVERY FOUR  HOURS AS NEEDED FOR WHEEZING OR SHORTNESS OF BREATH. 30 mL 0  . albuterol (VENTOLIN HFA) 108 (90 Base) MCG/ACT inhaler Inhale 2 puffs into the lungs every 6 (six) hours as needed for wheezing or shortness of breath. 8 g 5  . Alcohol Swabs (B-D SINGLE USE SWABS REGULAR) PADS Use twice a day when checking blood sugars 100 each 1  . aspirin EC 81 MG tablet Take 81 mg daily by mouth.    . bisoprolol-hydrochlorothiazide (ZIAC) 10-6.25 MG tablet Take 1 tablet by mouth daily. 90 tablet 1  . CALCIUM-MAGNESIUM-VITAMIN D PO Take 1 tablet by mouth in the morning and at bedtime.     . cetirizine (KLS ALLER-TEC) 10 MG tablet Take 10 mg by mouth daily.    . fluticasone (FLONASE) 50 MCG/ACT nasal spray USE 2 SPRAYS IN EACH NOSTRIL EVERY DAY 48 g 0  . furosemide (LASIX) 20 MG tablet TAKE 1 TABLET (20 MG TOTAL) BY MOUTH DAILY AS NEEDED FOR FLUID OR EDEMA. 30 tablet 0  . glucose blood (ACCU-CHEK AVIVA PLUS) test strip TEST BLOOD SUGAR ONE TO TWO TIMES DAILY 100 each 1  . metFORMIN (GLUCOPHAGE-XR) 500 MG 24 hr tablet Take 1 tablet (500 mg total) by mouth 3 (three) times daily. 270 tablet 1  . montelukast (SINGULAIR) 10 MG tablet Take 1 tablet (10 mg total) by mouth at bedtime. 30 tablet 11  . simvastatin (ZOCOR) 20 MG tablet TAKE 1 TABLET AT BEDTIME 90 tablet 0  . triamcinolone cream (KENALOG) 0.1 % Apply 1 application topically 2 (two) times daily as needed (  redness).     . TRUEplus Lancets 30G MISC TEST BLOOD SUGAR ONE TO TWO TIMES DAILY 200 each 2  . umeclidinium-vilanterol (ANORO ELLIPTA) 62.5-25 MCG/INH AEPB Inhale 1 puff into the lungs daily. 180 each 3   No facility-administered medications prior to visit.    Review of Systems  Constitutional: Negative for chills, diaphoresis, fever, malaise/fatigue and weight loss.  HENT: Negative for congestion.   Respiratory: Negative for cough, hemoptysis, sputum production, shortness of breath and wheezing.   Cardiovascular: Negative for chest pain, palpitations and  leg swelling.  Endo/Heme/Allergies: Positive for environmental allergies.    Objective:   Vitals:   03/30/20 1110  BP: 126/72  Pulse: 64  Temp: 97.6 F (36.4 C)  TempSrc: Oral  SpO2: 96%  Weight: 219 lb 4 oz (99.5 kg)  Height: 5\' 4"  (1.626 m)   SpO2: 96 %  Physical Exam: General: Well-appearing, no acute distress HENT: [place], AT Eyes: EOMI, no scleral icterus Respiratory: Clear to auscultation bilaterally.  No crackles, wheezing or rales Cardiovascular: RRR, -M/R/G, no JVD Extremities:-Edema,-tenderness Neuro: AAO x4, CNII-XII grossly intact Skin: Intact, no rashes or bruising Psych: Normal mood, normal affect  Data Reviewed:  Imaging Screening CT chest 09/10/16 -moderate emphysema Screening CT 09/16/17-moderate emphysema, scattered subcentimeter pulmonary nodules.  Fatty liver with findings of cirrhosis. Screening CT 11/29/18- interval development of RUL nodule measuring 6.1 mm CT Chest 03/03/19 - slightly decreased RUL nodule measuring 5.3 mm Screening CT chest - centrilobular emphysema. CT 03/26/20. Scattered tiny pulmonary nodules, stable  PFTs 08/28/11 FVC 2.71 [92%], FEV1 1.33 (62%), F/F 49, TLC 114%, DLCO 46% Moderate obstructive defect with moderate reduction in diffusion capacity. No bronchodilator response  11/08/15 FVC 2.05 [7%), FEV1 1.23 (55%), F/F 60 Moderate obstructive defect  09/24/16 FVC 2. (82%), FEV1 1.43 (62%), F/F 58 , TLC 99%, DLCO 54% Moderate obstruction with moderate diffusion defect. No bronchodilator response.  Labs A1AT 08/08/16- 162, PIMM  Sleep study Home sleep study 05/03/19 - AHI 8.8 Nadir SpO2 76%  Imaging, labs and test noted above have been reviewed independently by me.    Assessment & Plan:  74 year old female with COPD, OSA and nocturnal hypoxemia who presents for follow-up. COPD symptoms well-controlled on current bronchodilators. Has nocturnal hypoxemia secondary to OSA. Discussed and recommended auto CPAP however patient would  prefer to work on weight loss. This is reasonable has treatment of mild OSA can be controversial. In the future, we will need to consider repeat sleep study. Ok to continue oxygen for now for management.  Moderate COPD GOLD A/B --CONTINUE Anoro ONE puff ONCE a day. Will message pharmacy for status of patient assistance --CONTINUE Albuterol. Use as needed  Nocturnal hypoxemic respiratory failure secondary to mild OSA  Discussed CPAP vs oral appliance vs weight loss. She is currently enrolled in weight loss program (limits sugar and carb intake) in which she previously had success (lost 40-50lbs). --Encourage weight loss --Wear 2L oxygen via nasal cannula nightly   Health maintenance  Immunization History  Administered Date(s) Administered  . Fluad Quad(high Dose 65+) 04/07/2019  . Influenza Split 05/28/2011  . Influenza, High Dose Seasonal PF 04/07/2013, 05/15/2014, 05/30/2015, 03/25/2016, 03/05/2017, 04/01/2018  . Influenza, Seasonal, Injecte, Preservative Fre 06/07/2012  . PFIZER SARS-COV-2 Vaccination 08/20/2019, 09/13/2019  . Pneumococcal Conjugate-13 07/06/2014  . Pneumococcal Polysaccharide-23 12/15/2004, 05/28/2011  . Tdap 02/23/2008, 06/15/2018  . Zoster 05/17/2010  . Zoster Recombinat (Shingrix) 12/02/2017, 02/06/2018   Annual CT Lung - Last CT 03/26/20. Scattered tiny pulmonary nodules, stable  No orders of the defined types were placed in this encounter.  No orders of the defined types were placed in this encounter.  Return in about 3 months (around 06/30/2020).  I have spent a total time of 32-minutes on the day of the appointment reviewing prior documentation, coordinating care and discussing medical diagnosis and plan with the patient/family. Imaging, labs and tests included in this note have been reviewed and interpreted independently by me.  Aubree Doody Rodman Pickle, MD [facility] 03/30/2020 11:17 AM

## 2020-03-30 NOTE — Patient Instructions (Signed)
Moderate COPD GOLD A/B --CONTINUE Anoro ONE puff ONCE a day. Will message pharmacy for status of patient assistance --CONTINUE Albuterol. Use as needed  Nocturnal hypoxemic respiratory failure secondary to mild OSA  Discussed CPAP vs oral appliance vs weight loss. She is currently enrolled in weight loss program (limits sugar and carb intake) in which she previously had success (lost 40-50lbs). --Encourage weight loss --Wear 2L oxygen via nasal cannula nightly   Follow-up with me in 3 months

## 2020-04-02 NOTE — Telephone Encounter (Signed)
Triage, please advise if this form has been located. There is a pt email regarding this as well.

## 2020-04-03 NOTE — Telephone Encounter (Signed)
Patient dropping off forms for patient assistance, Needs forms and RX faxed to [facility]. Putting forms in Dr. Loanne Drilling box. Patient phone number is [phone].

## 2020-04-03 NOTE — Telephone Encounter (Signed)
I have tried to locate these forms, I was unsuccessful. Lauren do you happen to know where these forms might be?

## 2020-04-05 ENCOUNTER — Other Ambulatory Visit: Payer: Self-pay | Admitting: Family Medicine

## 2020-04-05 DIAGNOSIS — Z4431 Encounter for fitting and adjustment of external right breast prosthesis: Secondary | ICD-10-CM | POA: Diagnosis not present

## 2020-04-05 DIAGNOSIS — C50111 Malignant neoplasm of central portion of right female breast: Secondary | ICD-10-CM | POA: Diagnosis not present

## 2020-04-05 NOTE — Telephone Encounter (Signed)
Will forward to Lauren to follow up with forms and fax.

## 2020-04-06 DIAGNOSIS — H16143 Punctate keratitis, bilateral: Secondary | ICD-10-CM | POA: Diagnosis not present

## 2020-04-11 MED ORDER — ANORO ELLIPTA 62.5-25 MCG/INH IN AEPB: 1.0000 | INHALATION_SPRAY | Freq: Every day | RESPIRATORY_TRACT | 11 refills | Status: DC

## 2020-04-11 NOTE — Telephone Encounter (Signed)
Documentation was received. Dr. Loanne Drilling signed RX everything faxed to [facility]

## 2020-04-13 ENCOUNTER — Telehealth: Payer: Self-pay | Admitting: Pulmonary Disease

## 2020-04-13 NOTE — Telephone Encounter (Signed)
Called and spoke with Shannon Schultz, pharmacist with [facility], she confirmed that they did not receive the script on 03/20/20.  Verbal order given for montelukast.  They will fill a 90 day supply with 3 refills.  Nothing further needed.  Called patient to let her know that her montelukast had been sent to [facility] and will be filled in a 90 day supply.  Left a VM making her aware, advised to call on Monday as the office is currently closed with any questions.    [place] and was advised that the patient had picked up her montelukast from the pharmacy.

## 2020-04-23 ENCOUNTER — Other Ambulatory Visit: Payer: Medicare HMO

## 2020-04-23 ENCOUNTER — Other Ambulatory Visit: Payer: Self-pay

## 2020-04-23 DIAGNOSIS — K7460 Unspecified cirrhosis of liver: Secondary | ICD-10-CM | POA: Diagnosis not present

## 2020-04-23 DIAGNOSIS — Z5181 Encounter for therapeutic drug level monitoring: Secondary | ICD-10-CM

## 2020-04-23 DIAGNOSIS — E118 Type 2 diabetes mellitus with unspecified complications: Secondary | ICD-10-CM

## 2020-04-23 DIAGNOSIS — I10 Essential (primary) hypertension: Secondary | ICD-10-CM | POA: Diagnosis not present

## 2020-04-24 LAB — COMPREHENSIVE METABOLIC PANEL
ALT: 20 IU/L (ref 0–32)
AST: 29 IU/L (ref 0–40)
Albumin/Globulin Ratio: 1.8 (ref 1.2–2.2)
Albumin: 4.4 g/dL (ref 3.7–4.7)
Alkaline Phosphatase: 64 IU/L (ref 44–121)
BUN/Creatinine Ratio: 23 (ref 12–28)
BUN: 17 mg/dL (ref 8–27)
Bilirubin Total: 0.8 mg/dL (ref 0.0–1.2)
CO2: 23 mmol/L (ref 20–29)
Calcium: 9.7 mg/dL (ref 8.7–10.3)
Chloride: 102 mmol/L (ref 96–106)
Creatinine, Ser: 0.75 mg/dL (ref 0.57–1.00)
GFR calc Af Amer: 91 mL/min/{1.73_m2} (ref >59)
GFR calc non Af Amer: 79 mL/min/{1.73_m2} (ref >59)
Globulin, Total: 2.5 g/dL (ref 1.5–4.5)
Glucose: 107 mg/dL — ABNORMAL HIGH (ref 65–99)
Potassium: 4.3 mmol/L (ref 3.5–5.2)
Sodium: 139 mmol/L (ref 134–144)
Total Protein: 6.9 g/dL (ref 6.0–8.5)

## 2020-04-24 LAB — TSH: TSH: 3.15 u[IU]/mL (ref 0.450–4.500)

## 2020-04-24 LAB — HEMOGLOBIN A1C
Est. average glucose Bld gHb Est-mCnc: 108 mg/dL
Hgb A1c MFr Bld: 5.4 % (ref 4.8–5.6)

## 2020-04-25 NOTE — Patient Instructions (Addendum)
HEALTH MAINTENANCE RECOMMENDATIONS:  It is recommended that you get at least 30 minutes of aerobic exercise at least 5 days/week (for weight loss, you may need as much as 60-90 minutes). This can be any activity that gets your heart rate up. This can be divided in 10-15 minute intervals if needed, but try and build up your endurance at least once a week.  Weight bearing exercise is also recommended twice weekly.  Eat a healthy diet with lots of vegetables, fruits and fiber.  "Colorful" foods have a lot of vitamins (ie green vegetables, tomatoes, red peppers, etc).  Limit sweet tea, regular sodas and alcoholic beverages, all of which has a lot of calories and sugar.  Up to 1 alcoholic drink daily may be beneficial for women (unless trying to lose weight, watch sugars).  Drink a lot of water.  Calcium recommendations are 1200-1500 mg daily (1500 mg for postmenopausal women or women without ovaries), and vitamin D 1000 IU daily.  This should be obtained from diet and/or supplements (vitamins), and calcium should not be taken all at once, but in divided doses.  Monthly self breast exams and yearly mammograms for women over the age of 5 is recommended.  Sunscreen of at least SPF 30 should be used on all sun-exposed parts of the skin when outside between the hours of 10 am and 4 pm (not just when at beach or pool, but even with exercise, golf, tennis, and yard work!)  Use a sunscreen that says "broad spectrum" so it covers both UVA and UVB rays, and make sure to reapply every 1-2 hours.  Remember to change the batteries in your smoke detectors when changing your clock times in the spring and fall. Carbon monoxide detectors are recommended for your home.  Use your seat belt every time you are in a car, and please drive safely and not be distracted with cell phones and texting while driving.   Shannon Schultz , Thank you for taking time to come for your Medicare Wellness Visit. I appreciate your ongoing  commitment to your health goals. Please review the following plan we discussed and let me know if I can assist you in the future.    This is a list of the screening recommended for you and due dates:  Health Maintenance  Topic Date Due  . Flu Shot  01/29/2020  . Complete foot exam   04/06/2020  . Hemoglobin A1C  10/22/2020  . Eye exam for diabetics  11/22/2020  . Mammogram  12/11/2020  . Colon Cancer Screening  01/23/2023  . Tetanus Vaccine  06/15/2028  . COVID-19 Vaccine  Completed  .  Hepatitis C: One time screening is recommended by Center for Disease Control  (CDC) for  adults born from [birth year] through 1965.   Completed  . Pneumonia vaccines  Completed   Flu shot and diabetic foot exam were performed today. We should consider a pneumovax booster at your next visit (vs getting from pulmonary clinic).  Ultrasound of aorta is recommended for follow-up on the mild aneurysm noted (though not needed if being seen on other studies, ie yearly CT scans)..  You should be sure that Dr. Loletha Grayer is aware of this (showed on the CT done by Dr. Marlou Starks).  Your diabetes is well controlled--let's have you STOP taking the lunch-time dose of metformin (continue only twice daily metformin).  I believe you are due to see [facility] Al[facility] at [facility] for routine yearly follow-up.  You may be taking too much  calcium (if it is truly 600mg  PER TABLET, and not 2 tablets/serving).  Get a TOTAL of 1200-1500mg  from your diet PLUS any supplements  Use the steroid cream only if the skin is raised/thickened.  Use a moisturizing cream more regularly.  Try using Desitin (zinc oxide) barrier cream to the area on the buttock.  Use it daily until healed.

## 2020-04-25 NOTE — Progress Notes (Signed)
Chief Complaint  Patient presents with  . Medicare Wellness    fasting AWV/CPE. Has not seen [facility]/Dr. Alinda Money since 10/2018. Still has a sore on her bottom that she saw [facility] for in Aug that is still bothering her.     Shannon Schultz is a 74 y.o. female who presents for annual physical exam, Medicare wellness visit and follow-up on chronic medical conditions.   She thinks she pulled a muscle.  2 days ago, had been sitting on her walker, had been bending down, cleaning out a book case.  She pulled a muscle--at the back of the upper thigh/buttock--feels it quiver. No radiation down the leg. No actual back pain. No weakness.  She is concerned about a spot on her bottom that has been there since she was in [facility].  She saw Dr. Redmond School for this in August.  It hasn't completely healed, scabs up, which then falls off, but recurs.  She had used neosporin, was told to stop it.  Last ABX was in March at the rehab facility.  She is s/p R hip fracture and R shoulder dislocation/fractured related to a fall in 08/2019. Has some pain in her R shoulder, and decreased ROM.  Tries to remember to do her HEP regularly.  She felt like she had recurrence of hernia, and had f/u with Dr. Marlou Starks CT done 10/2019: IMPRESSION: 1. Interval surgical repair of previously seen ventral abdominal wall hernias. No evidence of recurrent hernia, mass, or other acute findings. 2. Hepatic cirrhosis and findings of portal venous hypertension. No evidence of ascites. 3. Colonic diverticulosis. No radiographic evidence of diverticulitis. 4. Stable 3.0 cm proximal abdominal aortic aneurysm. Recommend followup by ultrasound in 3 years. This recommendation follows ACR consensus guidelines: White Paper of the ACR Incidental FindingsCommittee II on Vascular Findings. J Am Coll Radiol 2013; 10:789-794  Diabetes: Blood sugars at home are running 88-123 mostly 100-110. It was 107 today. Denies hypoglycemia,polydipsia and polyuria.   Lastyearlyeye exam wasin 10/2019, no retinopathy found. Patient follows a low sugar diet and checks feet regularly without concerns. Denies numbness, tingling other thanintermittent numbness in the 2nd and 3rd toes on the right foot (h/o fractures, didn't heal right, per pt). No skin lesions. Takes metformin TID--this seems to be the most tolerable way to take it(GI side effects if she takes more than one at a time).  Hypertension: Blood pressure hasn't been checked regularly.  It was 120/60 on last check. She hasn't taken her med yet today. Denies headaches, chest pain. Denies side effects of medications (on bisoprolol HCT). Compliant with medication  COPD: She is doing well onAnoro, only rarely needs albuterol (can't recall the last time). She had recent follow-up with pulmonary. She has nocturnal hypoxemia secondary to OSA and uses nocturnal oxygen. She had declined CPAP titration, opting to work on weight loss and continue oxygen. OSA was felt to be mild. She gets CT for screening for lung cancer, last was 02/2020. IMPRESSION: 1. Lung-RADS 2, benign appearance or behavior. Continue annual screening with low-dose chest CT without contrast in 12 months. 2. Cirrhosis. 3. Aortic Atherosclerosis (ICD10-I70.0) and Emphysema (ICD10-J43.9).  Pulmonary had referred her to ENT for vertigo and recurrent sinus infections. She saw Dr. Lucia Gaskins in 12/2019. He performed scope, saw no evidence of sinusitis. He diagnosed chronic rhinitis and ETD, possible BPPV. He recommended regular nasal steroid use. She is using Flonase nightly. She still gets some popping of her ears, but not as often.  Only mild, intermittent vertigo. Denies  any sinus issues, runny nose.  She is under the care of cardiologist (now Dr. Recardo Evangelist).She has known moderate aortic stenosis, monitored yearly with echocardiogram, last of which was 05/2019.  Aortic stenosis has worsened, remains in moderate range. Echo to be repeated 1  year. She had nuclear stress test in 2016, low risk study.She denies syncope, angina, dyspnea. Breathing has been stable. Only trace swelling in ankles.  Hyperlipidemia follow-up: Patient is reportedly following a low-fat, low cholesterol diet. Compliant withsimvastatinand denies medication side effects.  Lipids were at goal on last check Lab Results  Component Value Date   CHOL 116 10/03/2019   HDL 54 10/03/2019   LDLCALC 43 10/03/2019   TRIG 101 10/03/2019   CHOLHDL 2.1 10/03/2019   Allergies:Using flonase nightly and zyrtec.  She is s/p L lumpectomy in 11/2016 for atypical ductal and atypical lobular hyperplasia.  She last saw Dr. Lindi Adie (onc) 02/16/17. No additional treatments were recommended, no follow-up needed.  H/o bladder cancer--lesion in bladder was noted incidentally on CT 11/2016 prior to hernia surgery.She had bladder mass removed by urology in 12/2016, and pathology showedLOW GRADE PAPILLARY UROTHELIAL CARCINOMA She was treated with intravesicular post-op chemo. She was last seen by Dr. Alinda Money in 10/2018, no cystoscopic evidence of recurrence.  She was to return in 1 year.  Does not have appointment yet.  She previously took valtrex preventatively for HSV flare on hip. Stopped taking this over a year ago. She only rarely has flares since she got shingles vaccine, last was 2 months ago.  So mild she didn't need to take Valtrex, which she still has.  H/o right rotator cuff tear--seen on MRI 06/2017.  Was scheduled for surgery, but was delayed to due to dental issues, then hernia repair and cellulitis. Her shoulder pain had improved, did well until another injury, had cortisone injection and PT (through Dr. Rhona Raider).    Shoulder had improved, has pain sleeping in her bed (no problem on the couch) and with certain positions, but she can manage.  She then had shoulder disclocation (and fracture) with her fall earlier this year, having more pain.  No plans for surgery. She  followed up with Dr. Stann Mainland (who took care of her in the hospital), said no surgery needed. She now sleeps in bed with adjustable base, usually gets back into the recliner after a bathroom break.  H/obilateraltrochanteric bursitis--last treated with cortisone injection into the right trochanteric bursa in October 2019. She didn't get much relief. She still has some pain in both hips.  Immunization History  Administered Date(s) Administered  . Fluad Quad(high Dose 65+) 04/07/2019, 04/26/2020  . Influenza Split 05/28/2011  . Influenza, High Dose Seasonal PF 04/07/2013, 05/15/2014, 05/30/2015, 03/25/2016, 03/05/2017, 04/01/2018  . Influenza, Seasonal, Injecte, Preservative Fre 06/07/2012  . PFIZER SARS-COV-2 Vaccination 08/20/2019, 09/13/2019, 04/14/2020  . Pneumococcal Conjugate-13 07/06/2014  . Pneumococcal Polysaccharide-23 12/15/2004, 05/28/2011  . Tdap 02/23/2008, 06/15/2018  . Zoster 05/17/2010  . Zoster Recombinat (Shingrix) 12/02/2017, 02/06/2018   Last Pap smear: 02/2017--normal, no high risk HPV detected Last mammogram:11/2019 Last colonoscopy: 12/2015 with Dr. Mann--diverticulosis and internal hemorrhoids. F/U recommend 7 years (12/2022) Last DEXA: 12/2014 normal Dentist: twice a year Ophtho: yearly (at least) Exercise: little to none currently. She walks 10-15 minutes/day.  Has dumbbells at home, not using them.  Depression screen: negative Fall screen: 1, fractured hip,disclocated shoulder Function status screen: trouble with tall stairs only (has to walk sideways and use railing). Mini-Cog screen: normal (score of 5)  Dermatologist--Dr. Durward Fortes Dentist--Dr.  Lance Morin GI--Dr. Collene Mares Cardiologist: Dr. Recardo Evangelist Oncologist: Dr. Lindi Adie Surgeon: Dr. Marlou Starks Urologist: Dr. Alinda Money Pulmonary: Dr. [name] Ortho: Dr. Rhona Raider in the past, now has seen Dr. Lyla Glassing (hip). Dr. Stann Mainland (shoulder) ENT: Dr. Lucia Gaskins  End of Life Discussion: Patient hasa  living will and medical power of attorney, scanned in chart.   PMH, PSH, SH and FH were reviewed and updated.  Outpatient Encounter Medications as of 04/26/2020  Medication Sig Note  . Accu-Chek Softclix Lancets lancets Test BS 1-2 times daily. Pt uses accu-chek meter   . Alcohol Swabs (B-D SINGLE USE SWABS REGULAR) PADS Use twice a day when checking blood sugars   . aspirin EC 81 MG tablet Take 81 mg daily by mouth.   . bisoprolol-hydrochlorothiazide (ZIAC) 10-6.25 MG tablet Take 1 tablet by mouth daily.   . Calcium Carbonate (CALCIUM 600 PO) Take 2 capsules by mouth daily.   Marland Kitchen CALCIUM-MAGNESIUM-VITAMIN D PO Take 1 tablet by mouth in the morning and at bedtime.    . cetirizine (KLS ALLER-TEC) 10 MG tablet Take 10 mg by mouth daily.   . fluticasone (FLONASE) 50 MCG/ACT nasal spray USE 2 SPRAYS IN EACH NOSTRIL EVERY DAY   . glucose blood (ACCU-CHEK AVIVA PLUS) test strip TEST BLOOD SUGAR ONE TO TWO TIMES DAILY   . metFORMIN (GLUCOPHAGE-XR) 500 MG 24 hr tablet TAKE 1 TABLET THREE TIMES DAILY   . montelukast (SINGULAIR) 10 MG tablet Take 1 tablet (10 mg total) by mouth at bedtime.   . simvastatin (ZOCOR) 20 MG tablet TAKE 1 TABLET AT BEDTIME   . triamcinolone cream (KENALOG) 0.1 % Apply 1 application topically 2 (two) times daily as needed (redness).    . TRUEplus Lancets 30G MISC TEST BLOOD SUGAR ONE TO TWO TIMES DAILY   . umeclidinium-vilanterol (ANORO ELLIPTA) 62.5-25 MCG/INH AEPB Inhale 1 puff into the lungs daily.   Marland Kitchen acetaminophen (TYLENOL) 500 MG tablet Take 1,000 mg every 6 (six) hours as needed by mouth for moderate pain or headache.  (Patient not taking: Reported on 04/26/2020)   . albuterol (PROVENTIL) (2.5 MG/3ML) 0.083% nebulizer solution USE 1 VIAL BY NEBULIZATION EVERY FOUR HOURS AS NEEDED FOR WHEEZING OR SHORTNESS OF BREATH. (Patient not taking: Reported on 04/26/2020)   . albuterol (VENTOLIN HFA) 108 (90 Base) MCG/ACT inhaler Inhale 2 puffs into the lungs every 6 (six) hours as  needed for wheezing or shortness of breath. (Patient not taking: Reported on 04/26/2020) 05/06/2019: PRN  . furosemide (LASIX) 20 MG tablet TAKE 1 TABLET (20 MG TOTAL) BY MOUTH DAILY AS NEEDED FOR FLUID OR EDEMA. (Patient not taking: Reported on 04/26/2020)   . [DISCONTINUED] umeclidinium-vilanterol (ANORO ELLIPTA) 62.5-25 MCG/INH AEPB Inhale 1 puff into the lungs daily.    No facility-administered encounter medications on file as of 04/26/2020.   Allergies  Allergen Reactions  . Contrast Media [Iodinated Diagnostic Agents] Anaphylaxis, Shortness Of Breath and Other (See Comments)    Could not breath  . Iohexol Anaphylaxis, Shortness Of Breath and Other (See Comments)    Immediately could not breathe  . Lisinopril Anaphylaxis, Shortness Of Breath and Rash  . Sulfa Antibiotics Anaphylaxis and Other (See Comments)    Historical from mother, pt states that mother says she almost died from this drug  . Latex Other (See Comments)    Unless against on skin for a long time, blisters. Short term is okay.  . Codeine Nausea Only, Anxiety and Other (See Comments)    insomnia  . Levofloxacin Other (  See Comments)    insomnia  . Lipitor [Atorvastatin Calcium] Rash  . Vicodin [Hydrocodone-Acetaminophen] Itching    ROS: The patient denies anorexia, fever, vision changes, decreased hearing, ear pain, sore throat, breast concerns, chest pain, palpitations, dizziness, syncope, cough, swelling, nausea, vomiting, constipation, abdominal pain, melena, hematochezia, indigestion/heartburn, hematuria, incontinence, dysuria, vaginal bleeding, discharge, odor or itch, genital lesions,numbness, tingling, weakness, tremor, suspicious skin lesions, depression, anxiety, abnormal bleeding/bruising, or enlarged lymph nodes. No recent swelling in the legs or rash. Spot on her bottom that isn't healing. Bilateral hip pain (bursitis). R upper leg/hip pain x 2 days per HPI Allergies are well controlled. Some hand pain  from arthritis, mostly in the thumbs Some tingling in right 2nd and 3rd toes, chronic/stable. R shoulder pain, with limited ROM, tolerable.   PHYSICAL EXAM:  BP 140/64   Pulse 60   Ht 5\' 4"  (1.626 m)   Wt 222 lb 9.6 oz (101 kg)   LMP  (LMP Unknown)   BMI 38.21 kg/m   Wt Readings from Last 3 Encounters:  04/26/20 222 lb 9.6 oz (101 kg)  03/30/20 219 lb 4 oz (99.5 kg)  02/02/20 213 lb 12.8 oz (97 kg)   138/64 on repeat by MD  General Appearance:  Alert, cooperative, no distress, appears stated age.   Head:  Normocephalic, without obvious abnormality, atraumatic   Eyes:  PERRL, conjunctiva/corneas clear, EOM's intact, fundi benign.   Ears:  Normal TM's and external ear canals   Nose:  Not examined, wearing mask due to COVID-19 pandemic  Throat:  Not examined, wearing mask due to COVID-19 pandemic   Neck:  Supple, no lymphadenopathy; thyroid: no enlargement/ tenderness/nodules; no carotid bruit or JVD   Back:  Spine nontender, no curvature, ROM normal, no CVA tenderness   Lungs:  Clear to auscultation bilaterally without wheezes, rales or ronchi; respirations unlabored   Chest Wall:  No tenderness ormass. Absence of R breast, WHSS.  Heart:  Regular rate and rhythm, S1 and S2 normal, no rub or gallop. 3/6 SEM loudest at RUSB, and radiates into R>Lcarotid   Breast Exam:  No tenderness, masses, or nipple discharge or inversion of L breast.WHSSalong superior aspect of areola.No axillary lymphadenopathy. Right breast is absent. small subcutaneous BB-like feeling mobile mass. 2-13mm in size  Abdomen:  Soft, non-tender, nondistended, normoactive bowel sounds, no masses, no hepatosplenomegaly. + abdominal obesity.WHSS  Genitalia:  Normal external genitalia without lesions, mild atrophic changes. BUS and vagina normal; No cervical motion tenderness. No abnormal vaginal discharge. Uterus and adnexa not enlarged, nontender, no masses, although exam is limited by body  habitus. Pap not performed   Rectal:  Normal sphincter tone, no mass. Heme negative stool  Extremities:  Trace edema at ankles only. Normal diabetic foot exam. Tender at right> left trochanteric bursa. She has some pain with external rotation of the R hip (reports this is causing the muscle pain area to hurt, not at groin).  She has some tenderness to palpation above the level of the greater trochanter on the R, over TFL.  Pulses:  2+ and symmetric all extremities   Skin:  Skin color, texture, turgor normal. Venous stasis changes bilateral lower legs with--hyperpigmented diffusely, with some slight hypopigmentation noted, very smooth. At the left buttock cheek, medial portion, there is an area that is slightly pink, but skin is intact.  At the superior-most part of this there is a hyperkeratotic/slightly rough area. There is prominent superficial veins in feet giving bluish discoloration, L>R. Prominent vein right  shin. Normal sensation to monofilament.  Lymph nodes:  Cervical, supraclavicular, and axillary nodes normal   Neurologic:  Normal strength, sensation and gait; reflexes 2+ and symmetric throughout    Psych: Normal mood, affect, hygiene and grooming   Lab Results  Component Value Date   HGBA1C 5.4 04/23/2020     Chemistry      Component Value Date/Time   NA 139 04/23/2020 0849   K 4.3 04/23/2020 0849   CL 102 04/23/2020 0849   CO2 23 04/23/2020 0849   BUN 17 04/23/2020 0849   CREATININE 0.75 04/23/2020 0849   CREATININE 0.75 03/05/2017 0736      Component Value Date/Time   CALCIUM 9.7 04/23/2020 0849   ALKPHOS 64 04/23/2020 0849   AST 29 04/23/2020 0849   ALT 20 04/23/2020 0849   BILITOT 0.8 04/23/2020 0849     Fasting glucose 107  Lab Results  Component Value Date   TSH 3.150 04/23/2020    ASSESSMENT/PLAN:  Annual physical exam  Medicare annual wellness visit, subsequent  Type 2 diabetes mellitus with complications ([place]) - well  controlled; can decreased metformin to BID; consider further decrease at f/u visit. Exercise and wt loss recommended - Plan: metFORMIN (GLUCOPHAGE-XR) 500 MG 24 hr tablet, Hemoglobin A1c, Microalbumin / creatinine urine ratio, Comprehensive metabolic panel  Essential hypertension, benign - borderline in office today, better at home.  Periodically monitor at home. CPM - Plan: bisoprolol-hydrochlorothiazide (ZIAC) 10-6.25 MG tablet, Comprehensive metabolic panel  Pure hypercholesterolemia - at goal per last check, cont statin - Plan: simvastatin (ZOCOR) 20 MG tablet, Lipid panel  Aortic atherosclerosis (HCC) - cont statin  Cirrhosis of liver without ascites, unspecified hepatic cirrhosis type ([place]) - recent scans again show this. No e/o liver failure or ascites. Wt loss encouraged, counseled - Plan: Comprehensive metabolic panel  Chronic hypoxemic respiratory failure (HCC) - continue oxygen at night   COPD, group B, by GOLD 2017 classification ([place]) - stable on current regimen, under care of pulm  Personal history of bladder cancer - past due for f/u with urologist, reminded to schedule  Aortic valve stenosis, etiology of cardiac valve disease unspecified - f/u as planned in December, with repeat echo due. No sx of severe stenosis (is moderate)  Abdominal aortic aneurysm (AAA) 3.0 cm to 5.0 cm in diameter in female [facility]) - noted on CT.  3 year f/u with [place] recommended per report. Pt advised of this finding, will d/w cards  Need for influenza vaccination - Plan: Flu Vaccine QUAD High Dose(Fluad)  Chronic non-seasonal allergic rhinitis - Plan: fluticasone (FLONASE) 50 MCG/ACT nasal spray  OSA (obstructive sleep apnea) - mild; weight loss rec  Medication monitoring encounter - Plan: Hemoglobin A1c, Microalbumin / creatinine urine ratio, Comprehensive metabolic panel, Lipid panel  Class 2 severe obesity due to excess calories with serious comorbidity and body mass index (BMI) of 38.0 to 38.9 in  adult ([place]) - comorbidity--HTN, DM, cirrhosis among others. Counseled re: healthy diet, portions, exercise, wt loss  Discussed use of desitin for skin concern R buttock. Reassured no sign of ulcer or infection.  To try heat for hip pain, stretches shown.  F/u with ortho if persistent/worsening pain.  F/u for med check in 6 mos--to recheck R axilla, offer pneumovax   Discussed monthly self breast exams and yearly mammograms; at least 30 minutes of aerobic activity at least 5 days/week and weight-bearing exercise 2x/week; proper sunscreen use reviewed; healthy diet, including goals of calcium and vitamin D intake and alcohol  recommendations (less than or equal to 1 drink/day) reviewed; regular seatbelt use; changing batteries in smoke detectors. Immunization recommendations discussed-- high dose flu shot given today.Consider pneumovax booster at next visit. Colonoscopy recommendations reviewed, UTD--f/u withDr. Collene Mares due 12/2022.  Full Code, Full Care MOST form reviewed and updated  Medicare Attestation I have personally reviewed: The patient's medical and social history Their use of alcohol, tobacco or illicit drugs Their current medications and supplements The patient's functional ability including ADLs,fall risks, home safety risks, cognitive, and hearing and visual impairment Diet and physical activities Evidence for depression or mood disorders  The patient's weight, height, BMI, and visual acuity have been recorded in the chart.  I have made referrals, counseling, and provided education to the patient based on review of the above and I have provided the patient with a written personalized care plan for preventive services.

## 2020-04-26 ENCOUNTER — Other Ambulatory Visit: Payer: Self-pay

## 2020-04-26 ENCOUNTER — Telehealth: Payer: Self-pay | Admitting: Internal Medicine

## 2020-04-26 ENCOUNTER — Encounter: Payer: Self-pay | Admitting: Family Medicine

## 2020-04-26 ENCOUNTER — Ambulatory Visit (INDEPENDENT_AMBULATORY_CARE_PROVIDER_SITE_OTHER): Payer: Medicare HMO | Admitting: Family Medicine

## 2020-04-26 VITALS — BP 138/64 | HR 60 | Ht 64.0 in | Wt 222.6 lb

## 2020-04-26 DIAGNOSIS — E7800 Pure hypercholesterolemia, unspecified: Secondary | ICD-10-CM | POA: Diagnosis not present

## 2020-04-26 DIAGNOSIS — I10 Essential (primary) hypertension: Secondary | ICD-10-CM | POA: Diagnosis not present

## 2020-04-26 DIAGNOSIS — I714 Abdominal aortic aneurysm, without rupture, unspecified: Secondary | ICD-10-CM

## 2020-04-26 DIAGNOSIS — K7460 Unspecified cirrhosis of liver: Secondary | ICD-10-CM

## 2020-04-26 DIAGNOSIS — J449 Chronic obstructive pulmonary disease, unspecified: Secondary | ICD-10-CM

## 2020-04-26 DIAGNOSIS — Z6838 Body mass index (BMI) 38.0-38.9, adult: Secondary | ICD-10-CM

## 2020-04-26 DIAGNOSIS — J9611 Chronic respiratory failure with hypoxia: Secondary | ICD-10-CM | POA: Diagnosis not present

## 2020-04-26 DIAGNOSIS — E119 Type 2 diabetes mellitus without complications: Secondary | ICD-10-CM | POA: Diagnosis not present

## 2020-04-26 DIAGNOSIS — G4733 Obstructive sleep apnea (adult) (pediatric): Secondary | ICD-10-CM

## 2020-04-26 DIAGNOSIS — Z0000 Encounter for general adult medical examination without abnormal findings: Secondary | ICD-10-CM

## 2020-04-26 DIAGNOSIS — Z23 Encounter for immunization: Secondary | ICD-10-CM | POA: Diagnosis not present

## 2020-04-26 DIAGNOSIS — J302 Other seasonal allergic rhinitis: Secondary | ICD-10-CM

## 2020-04-26 DIAGNOSIS — Z8551 Personal history of malignant neoplasm of bladder: Secondary | ICD-10-CM

## 2020-04-26 DIAGNOSIS — I700 Atherosclerosis of aorta: Secondary | ICD-10-CM | POA: Diagnosis not present

## 2020-04-26 DIAGNOSIS — I350 Nonrheumatic aortic (valve) stenosis: Secondary | ICD-10-CM | POA: Diagnosis not present

## 2020-04-26 DIAGNOSIS — J3089 Other allergic rhinitis: Secondary | ICD-10-CM

## 2020-04-26 DIAGNOSIS — E118 Type 2 diabetes mellitus with unspecified complications: Secondary | ICD-10-CM

## 2020-04-26 DIAGNOSIS — Z5181 Encounter for therapeutic drug level monitoring: Secondary | ICD-10-CM

## 2020-04-26 MED ORDER — SIMVASTATIN 20 MG PO TABS: 20.0000 mg | ORAL_TABLET | Freq: Every day | ORAL | 1 refills | Status: DC

## 2020-04-26 MED ORDER — FLUTICASONE PROPIONATE 50 MCG/ACT NA SUSP: 2.0000 | Freq: Every day | NASAL | 3 refills | Status: DC

## 2020-04-26 MED ORDER — BISOPROLOL-HYDROCHLOROTHIAZIDE 10-6.25 MG PO TABS: 1.0000 | ORAL_TABLET | Freq: Every day | ORAL | 1 refills | Status: DC

## 2020-04-26 MED ORDER — METFORMIN HCL ER 500 MG PO TB24: 500.0000 mg | ORAL_TABLET | Freq: Two times a day (BID) | ORAL | 0 refills | Status: DC

## 2020-04-26 NOTE — Telephone Encounter (Signed)
Pt states that she always comes in prior to her appt for blood work and since she was not scheduled she wanted to know does she need to come in prior to med check or cpe

## 2020-04-26 NOTE — Telephone Encounter (Signed)
Pt scheduled med check for April and cpe for November next year

## 2020-04-27 DIAGNOSIS — J309 Allergic rhinitis, unspecified: Secondary | ICD-10-CM | POA: Insufficient documentation

## 2020-04-27 DIAGNOSIS — J3089 Other allergic rhinitis: Secondary | ICD-10-CM | POA: Insufficient documentation

## 2020-04-27 NOTE — Telephone Encounter (Signed)
I don't see the med check scheduled for April. Please schedule this, and for labs prior. I put in the future orders for the April visit (I'll put the orders in for her physical at the April visit).

## 2020-04-27 NOTE — Telephone Encounter (Signed)
Pt is scheduled, I accidentally put the wrong patient on the schedule instead of Shannon Schultz. She is also scheduled for 4/25 for labs

## 2020-05-01 DIAGNOSIS — Z8551 Personal history of malignant neoplasm of bladder: Secondary | ICD-10-CM | POA: Diagnosis not present

## 2020-06-19 ENCOUNTER — Ambulatory Visit (HOSPITAL_COMMUNITY): Payer: Medicare HMO

## 2020-06-19 ENCOUNTER — Other Ambulatory Visit: Payer: Self-pay

## 2020-06-19 ENCOUNTER — Ambulatory Visit (HOSPITAL_COMMUNITY)
Admission: RE | Admit: 2020-06-19 | Discharge: 2020-06-19 | Disposition: A | Discharge status: Home / Self Care | Payer: Medicare HMO | Source: Ambulatory Visit | Attending: Cardiovascular Disease | Admitting: Cardiovascular Disease

## 2020-06-19 DIAGNOSIS — J449 Chronic obstructive pulmonary disease, unspecified: Secondary | ICD-10-CM | POA: Diagnosis not present

## 2020-06-19 DIAGNOSIS — Z8616 Personal history of COVID-19: Secondary | ICD-10-CM | POA: Insufficient documentation

## 2020-06-19 DIAGNOSIS — R011 Cardiac murmur, unspecified: Secondary | ICD-10-CM | POA: Insufficient documentation

## 2020-06-19 DIAGNOSIS — I350 Nonrheumatic aortic (valve) stenosis: Secondary | ICD-10-CM | POA: Diagnosis not present

## 2020-06-19 DIAGNOSIS — E119 Type 2 diabetes mellitus without complications: Secondary | ICD-10-CM | POA: Diagnosis not present

## 2020-06-19 DIAGNOSIS — I340 Nonrheumatic mitral (valve) insufficiency: Secondary | ICD-10-CM | POA: Insufficient documentation

## 2020-06-19 DIAGNOSIS — I517 Cardiomegaly: Secondary | ICD-10-CM | POA: Insufficient documentation

## 2020-06-19 LAB — ECHOCARDIOGRAM COMPLETE
AR max vel: 0.79 cm2
AV Area VTI: 0.7 cm2
AV Area mean vel: 0.69 cm2
AV Mean grad: 38 mmHg
AV Peak grad: 58.7 mmHg
Ao pk vel: 3.83 m/s
Area-P 1/2: 2.01 cm2
MV M vel: 5.71 m/s
MV Peak grad: 130.4 mmHg
S' Lateral: 2.5 cm

## 2020-06-19 NOTE — Progress Notes (Signed)
*  PRELIMINARY RESULTS* Echocardiogram 2D Echocardiogram has been performed.  Samuel Germany 06/19/2020, 1:46 PM

## 2020-06-20 ENCOUNTER — Other Ambulatory Visit (HOSPITAL_COMMUNITY): Payer: Medicare HMO

## 2020-06-25 ENCOUNTER — Encounter: Payer: Self-pay | Admitting: Cardiovascular Disease

## 2020-06-25 ENCOUNTER — Ambulatory Visit: Payer: Medicare HMO | Admitting: Cardiovascular Disease

## 2020-06-25 ENCOUNTER — Other Ambulatory Visit: Payer: Self-pay

## 2020-06-25 VITALS — BP 152/85 | HR 88 | Ht 64.0 in | Wt 227.8 lb

## 2020-06-25 DIAGNOSIS — I714 Abdominal aortic aneurysm, without rupture, unspecified: Secondary | ICD-10-CM

## 2020-06-25 DIAGNOSIS — K7460 Unspecified cirrhosis of liver: Secondary | ICD-10-CM

## 2020-06-25 DIAGNOSIS — I10 Essential (primary) hypertension: Secondary | ICD-10-CM

## 2020-06-25 DIAGNOSIS — E7800 Pure hypercholesterolemia, unspecified: Secondary | ICD-10-CM

## 2020-06-25 DIAGNOSIS — Z91041 Radiographic dye allergy status: Secondary | ICD-10-CM

## 2020-06-25 DIAGNOSIS — I350 Nonrheumatic aortic (valve) stenosis: Secondary | ICD-10-CM

## 2020-06-25 DIAGNOSIS — J449 Chronic obstructive pulmonary disease, unspecified: Secondary | ICD-10-CM | POA: Diagnosis not present

## 2020-06-25 DIAGNOSIS — Z01810 Encounter for preprocedural cardiovascular examination: Secondary | ICD-10-CM

## 2020-06-25 DIAGNOSIS — E669 Obesity, unspecified: Secondary | ICD-10-CM

## 2020-06-25 DIAGNOSIS — Z01818 Encounter for other preprocedural examination: Secondary | ICD-10-CM

## 2020-06-25 DIAGNOSIS — E1169 Type 2 diabetes mellitus with other specified complication: Secondary | ICD-10-CM | POA: Diagnosis not present

## 2020-06-25 DIAGNOSIS — Z6839 Body mass index (BMI) 39.0-39.9, adult: Secondary | ICD-10-CM

## 2020-06-25 MED ORDER — PREDNISONE 50 MG PO TABS: ORAL_TABLET | ORAL | 0 refills | Status: DC

## 2020-06-25 MED ORDER — DIPHENHYDRAMINE HCL 50 MG PO TABS: ORAL_TABLET | ORAL | 0 refills | Status: DC

## 2020-06-25 NOTE — Patient Instructions (Signed)
Medication Instructions:  No changes  *If you need a refill on your cardiac medications before your next appointment, please call your pharmacy*  Testing/Procedures: Your physician has requested that you have a cardiac catheterization. Cardiac catheterization is used to diagnose and/or treat various heart conditions. Doctors may recommend this procedure for a number of different reasons. The most common reason is to evaluate chest pain. Chest pain can be a symptom of coronary artery disease (CAD), and cardiac catheterization can show whether plaque is narrowing or blocking your heart's arteries. This procedure is also used to evaluate the valves, as well as measure the blood flow and oxygen levels in different parts of your heart. For further information please visit [email or web address] Please follow instruction sheet, as given.  Follow-Up: At [facility], you and your health needs are our priority.  As part of our continuing mission to provide you with exceptional heart care, we have created designated Provider Care Teams.  These Care Teams include your primary Cardiologist (physician) and Advanced Practice Providers (APPs -  Physician Assistants and Nurse Practitioners) who all work together to provide you with the care you need, when you need it.  We recommend signing up for the patient portal called "MyChart".  Sign up information is provided on this After Visit Summary.  MyChart is used to connect with patients for Virtual Visits (Telemedicine).  Patients are able to view lab/test results, encounter notes, upcoming appointments, etc.  Non-urgent messages can be sent to your provider as well.   To learn more about what you can do with MyChart, go to [email or web address].    Your next appointment:   3 month(s)  The format for your next appointment:   In Person  Provider:   Thurmon Fair, MD   Other Instructions    [facility] CARDIOVASCULAR  DIVISION [facility] [address] [place] [address] Dept: [phone] Loc: [phone]  Shannon Schultz  06/25/2020  You are scheduled for a Cardiac Catheterization on Thursday, January 6 with Dr. Verne Carrow.  1. Please arrive at the [facility] (Main Entrance A) at [facility]: [address] [place], [place] [address] at 5:30 AM (This time is two hours before your procedure to ensure your preparation). Free valet parking service is available.   Special note: Every effort is made to have your procedure done on time. Please understand that emergencies sometimes delay scheduled procedures.  2. Diet: Do not eat solid foods after midnight.  The patient may have clear liquids until 5am upon the day of the procedure.  3. Labs:  Your provider would like for you to return on 07/02/20 to have the following labs drawn: CBC and BMET. You do not need an appointment for the lab. Once in our office lobby there is a podium where you can sign in and ring the doorbell to alert [place] that you are here. The lab is open from 8:00 am to 4:30 pm; closed for lunch from 12:45pm-1:45pm.  You will need to have the coronavirus test completed prior to your procedure. An appointment has been made at 12:25 pm on 07/02/20. This is a Drive Up Visit at [address] [place], [place], [place] [address]. Please tell them that you are there for procedure testing. Stay in your car and someone will be with you shortly. Please make sure to have all other labs completed before this test because you will need to stay quarantined until your procedure.   4. Medication  instructions in preparation for your procedure: Hold the Furosemide the morning of the procedure Hold the Bisoprolol-Hydrochlorothiazide the morning of the procedure Hold the Metformin the morning of the procedure and 48 hours after. Hold all diabetic medication the morning of the procedure.  Contrast Allergy:  Please take  Prednisone 50mg  by mouth at: Thirteen hours prior to cath  Seven hours prior to cath  And prior to leaving home please take last dose of Prednisone 50mg  and Benadryl 50mg  by mouth.   On the morning of your procedure, take your Aspirin and any morning medicines NOT listed above.  You may use sips of water.  5. Plan for one night stay--bring personal belongings. 6. Bring a current list of your medications and current insurance cards. 7. You MUST have a responsible person to drive you home. 8. Someone MUST be with you the first 24 hours after you arrive home or your discharge will be delayed. 9. Please wear clothes that are easy to get on and off and wear slip-on shoes.  Thank you for allowing [place] to care for you!   -- [facility]

## 2020-06-25 NOTE — Progress Notes (Addendum)
Cardiology Office Note:    Date:  06/26/2020   ID:  Shannon Schultz, DOB 1946-04-25, MRN [id number]  PCP:  [name], MD  Cardiologist:   Sanda Klein, MD Electrophysiologist:  None  Referring MD: [name], MD   Chief Complaint  Patient presents with  . Cardiac Valve Problem    History of Present Illness:    Shannon Schultz is a 74 y.o. female with a hx of aortic valve stenosis, hypertension, severe obesity and COPD.  She had a normal nuclear stress test in 2016.  She has a history of a severe contrast allergy following the CT scan of the abdomen. She has chronic exertional shortness of breath, NYHA functional class II.  This is generally well controlled with inhalers.  She has been fairly sedentary due to a slow recovery from hip surgery almost 1 year ago.  She denies exertional angina or syncope.  Her shortness of breath has not really changed.  She does not have orthopnea or PND.  She denies palpitations.  She denies edema of the lower extremities.  Her echocardiogram 06/09/2018 showed moderate aortic stenosis with peak gradient of 52 mmHg, mean gradient 30 mmHg and a calculated aortic valve area of 1.2 cm, dimensionless index however was in the severe range of 0.25.  Follow-up echocardiogram performed 06/19/2020 shows clear evidence of progression to severe aortic stenosis.  A recent noncontrast CT of the abdomen has shown evidence of a small AAA at 3.0 cm (unchanged from previous CT studies).  Noncontrast CT of the chest as a screening tool for lung cancer did show evidence of coronary and aortic atherosclerosis.  Both imaging tests have shown a nodular contour to the liver suggestive of possible cirrhosis.   Past Medical History:  Diagnosis Date  . Arthritis    "hands" (05/15/2017)  . Bladder cancer ([place]) 2018  . Breast cancer, right ([place]) 1992   DCIS,bladder ca (just dx)  . Colon polyp   . Complication of anesthesia 1992   "local anesthesia" used was hard to awaken  from-no problems since (05/15/2017)  . COPD (chronic obstructive pulmonary disease) ([place])   . COVID-19 virus infection 05/05/2019  . Diverticulosis   . Elevated cholesterol   . Elevated liver enzymes    fatty liver per ultrasound per pt  . Environmental allergies    "I have allergies year round" (05/15/2017)  . Family history of malignant neoplasm of breast   . FHx: BRCA2 gene positive    sister with BRCA2 mutation (pt tested NEGATIVE)  . Genital HSV    gets on hip  . HSV (herpes simplex virus) infection    on hip--on daily suppression  . Hypertension   . Hypothyroidism    took med 7 yrs after birth of 1st child  . Impaired glucose tolerance   . Migraine   . On home oxygen therapy    "have it available but I'm not using it" (05/15/2017)  . Osteopenia   . Pneumonia ~ 1950; 08/06/2010  . Severe aortic stenosis   . Type 2 diabetes mellitus ([place])   . Ventral hernia   . Vitamin D deficiency disease     Past Surgical History:  Procedure Laterality Date  . ABDOMINAL HERNIA REPAIR  05/15/2017  . BREAST BIOPSY Right 1992  . BREAST BIOPSY Left 2018  . BREAST EXCISIONAL BIOPSY Left 2018   ATYPICAL DUCTAL HYPERPLASIA INVOLVING A COMPLEX  . BREAST LUMPECTOMY WITH RADIOACTIVE SEED LOCALIZATION Left 12/22/2016   Procedure: LEFT BREAST LUMPECTOMY  WITH RADIOACTIVE SEED LOCALIZATION;  Surgeon: Jovita Kussmaul, MD;  Location: [facility];  Service: General;  Laterality: Left;  . CATARACT EXTRACTION, BILATERAL Bilateral R 03/16/2019 L 03/30/2019   Dr. Kathlen Mody  . COLONOSCOPY  2009, 08/2010, 12/2015   Dr. Collene Mares; "only 1 had any polyps" (05/15/2017)  . CYSTOSCOPY  04/23/2017  . CYSTOSCOPY W/ RETROGRADES Bilateral 01/12/2017   Procedure: CYSTOSCOPY WITH RETROGRADE PYELOGRAM/ EXAM UNDER ANESTHESIA;  Surgeon: Raynelle Bring, MD;  Location: [facility];  Service: Urology;  Laterality: Bilateral;  . FEMUR IM NAIL Right 08/24/2019   Procedure: INTRAMEDULLARY (IM) NAIL FEMORAL;  Surgeon: Rod Can, MD;  Location:  [facility];  Service: Orthopedics;  Laterality: Right;  . HERNIA REPAIR    . INSERTION OF MESH N/A 05/15/2017   Procedure: INSERTION OF MESH;  Surgeon: Jovita Kussmaul, MD;  Location: [facility];  Service: General;  Laterality: N/A;  . LAPAROSCOPIC CHOLECYSTECTOMY  2003  . MASTECTOMY Right 1992  . TRANSURETHRAL RESECTION OF BLADDER TUMOR WITH MITOMYCIN-C N/A 01/12/2017   Procedure: TRANSURETHRAL RESECTION OF BLADDER TUMOR WITH POSSIBLE POST OPERATIVE INSTILLATION OF MITOMYCIN-C;  Surgeon: Raynelle Bring, MD;  Location: [facility];  Service: Urology;  Laterality: N/A;  . TYMPANOSTOMY TUBE PLACEMENT Bilateral 1980s  . UMBILICAL HERNIA REPAIR  2003  . VENTRAL HERNIA REPAIR N/A 05/15/2017   Procedure: VENTRAL HERNIA REPAIR WITH MESH;  Surgeon: Jovita Kussmaul, MD;  Location: [facility];  Service: General;  Laterality: N/A;    Current Medications: Current Meds  Medication Sig  . Accu-Chek Softclix Lancets lancets Test BS 1-2 times daily. Pt uses accu-chek meter  . acetaminophen (TYLENOL) 500 MG tablet Take 1,000 mg by mouth every 6 (six) hours as needed for moderate pain or headache.  . albuterol (PROVENTIL) (2.5 MG/3ML) 0.083% nebulizer solution USE 1 VIAL BY NEBULIZATION EVERY FOUR HOURS AS NEEDED FOR WHEEZING OR SHORTNESS OF BREATH.  Marland Kitchen albuterol (VENTOLIN HFA) 108 (90 Base) MCG/ACT inhaler Inhale 2 puffs into the lungs every 6 (six) hours as needed for wheezing or shortness of breath.  . Alcohol Swabs (B-D SINGLE USE SWABS REGULAR) PADS Use twice a day when checking blood sugars  . aspirin EC 81 MG tablet Take 81 mg daily by mouth.  . bisoprolol-hydrochlorothiazide (ZIAC) 10-6.25 MG tablet Take 1 tablet by mouth daily.  . Calcium Carbonate (CALCIUM 600 PO) Take 2 capsules by mouth daily.  Marland Kitchen CALCIUM-MAGNESIUM-VITAMIN D PO Take 1 tablet by mouth in the morning and at bedtime.   . cetirizine (ZYRTEC) 10 MG tablet Take 10 mg by mouth daily.  . diphenhydrAMINE (BENADRYL) 50 MG tablet Take one 50 mg tablet the morning  of the procedure with the last dose of the prednisone.  . fluticasone (FLONASE) 50 MCG/ACT nasal spray Place 2 sprays into both nostrils daily.  . furosemide (LASIX) 20 MG tablet TAKE 1 TABLET (20 MG TOTAL) BY MOUTH DAILY AS NEEDED FOR FLUID OR EDEMA.  Marland Kitchen glucose blood (ACCU-CHEK AVIVA PLUS) test strip TEST BLOOD SUGAR ONE TO TWO TIMES DAILY  . metFORMIN (GLUCOPHAGE-XR) 500 MG 24 hr tablet Take 1 tablet (500 mg total) by mouth 2 (two) times daily before a meal.  . montelukast (SINGULAIR) 10 MG tablet Take 1 tablet (10 mg total) by mouth at bedtime.  . predniSONE (DELTASONE) 50 MG tablet 13 hours prior to your procedure time take one Prednisone 50 mg tablet, then 7 hours prior to your procedure time take one Prednisone 50 mg tablet, and then 1-2 hours prior to procedure time (  just before you leave for the hospital) take one Prednisone 50 mg tablet  . simvastatin (ZOCOR) 20 MG tablet Take 1 tablet (20 mg total) by mouth at bedtime.  . triamcinolone cream (KENALOG) 0.1 % Apply 1 application topically 2 (two) times daily as needed (redness).   . TRUEplus Lancets 30G MISC TEST BLOOD SUGAR ONE TO TWO TIMES DAILY  . umeclidinium-vilanterol (ANORO ELLIPTA) 62.5-25 MCG/INH AEPB Inhale 1 puff into the lungs daily.     Allergies:   Contrast media [iodinated diagnostic agents], Iohexol, Lisinopril, Sulfa antibiotics, Latex, Codeine, Levofloxacin, Lipitor [atorvastatin calcium], and Vicodin [hydrocodone-acetaminophen]   Social History   Socioeconomic History  . Marital status: Married    Spouse name: Not on file  . Number of children: 3  . Years of education: Not on file  . Highest education level: Not on file  Occupational History  . Occupation: Retired  Tobacco Use  . Smoking status: Former Smoker    Packs/day: 1.00    Years: 46.00    Pack years: 46.00    Types: Cigarettes    Quit date: 05/31/2011    Years since quitting: 9.0  . Smokeless tobacco: Never Used  Vaping Use  . Vaping Use: Never  used  Substance and Sexual Activity  . Alcohol use: No  . Drug use: No  . Sexual activity: Yes    Partners: Male    Birth control/protection: Post-menopausal  Other Topics Concern  . Not on file  Social History Narrative   Lives with her husband.  Children all live in [place] nearby. No pets. 6 grandchildren.   Social Determinants of Health   Financial Resource Strain: Not on file  Food Insecurity: Not on file  Transportation Needs: Not on file  Physical Activity: Not on file  Stress: Not on file  Social Connections: Not on file     Family History: The patient's family history includes Allergies in her sister and sister; Asthma in her sister and sister; Breast cancer (age of onset: 26) in her sister; Cancer in her maternal grandmother and another family member; Diabetes in her father; Fibromyalgia in her sister and sister; Hashimoto's thyroiditis in her sister and sister; Heart disease in her father and mother; Hyperlipidemia in her sister and sister; Hypertension in her father; Stroke (age of onset: 65) in her paternal grandmother; Tuberculosis in her maternal grandfather.  ROS:   Please see the history of present illness.    All other systems are reviewed and are negative.   EKGs/Labs/Other Studies Reviewed:    The following studies were reviewed today:  ECHO 06/19/2020  Study Result  1. Left ventricular ejection fraction, by estimation, is 65 to 70%. The  left ventricle has normal function. The left ventricle has no regional  wall motion abnormalities. There is mild left ventricular hypertrophy.  Left ventricular diastolic parameters  are consistent with Grade I diastolic dysfunction (impaired relaxation).  2. Right ventricular systolic function is normal. The right ventricular  size is normal. Tricuspid regurgitation signal is inadequate for assessing  PA pressure.  3. The mitral valve is abnormal. Trivial mitral valve regurgitation.  4. The aortic valve is tricuspid.  Aortic valve regurgitation is not  visualized. Severe aortic valve stenosis. Aortic valve area, by VTI  measures 0.70 cm. Aortic valve mean gradient measures 38.0 mmHg. Aortic  valve Vmax measures 3.83 m/s. Peak gradient  59 mmHg. DI is 0.22.  5. The inferior vena cava is normal in size with greater than 50%  respiratory variability,  suggesting right atrial pressure of 3 mmHg.  6. Left atrial size was mildly dilated.   Comparison(s): Changes from prior study are noted. 06/29/19: LVEF 65-70%,  moderate to severe AS - mean gradient 29 mmHg, DI 0.26.       EKG:  EKG is  ordered today.  The ekg ordered today demonstrates normal sinus rhythm with a QS pattern in leads V1 and V2 and borderline QTC 4 to 67 ms, otherwise normal  Recent Labs: 10/03/2019: Hemoglobin 12.2; Platelets 155 04/23/2020: ALT 20; BUN 17; Creatinine, Ser 0.75; Potassium 4.3; Sodium 139; TSH 3.150  Recent Lipid Panel    Component Value Date/Time   CHOL 116 10/03/2019 0850   TRIG 101 10/03/2019 0850   HDL 54 10/03/2019 0850   CHOLHDL 2.1 10/03/2019 0850   CHOLHDL 2.2 08/18/2016 1346   VLDL 19 08/18/2016 1346   LDLCALC 43 10/03/2019 0850    Physical Exam:    VS:  BP (!) 152/85   Pulse 88   Ht _0  (1.626 m)   Wt 227 lb 12.8 oz (103.3 kg)   LMP  (LMP Unknown)   SpO2 93%   BMI 39.10 kg/m     Wt Readings from Last 3 Encounters:  06/25/20 227 lb 12.8 oz (103.3 kg)  04/26/20 222 lb 9.6 oz (101 kg)  03/30/20 219 lb 4 oz (99.5 kg)    General: Alert, oriented x3, no distress, severely obese Head: no evidence of trauma, PERRL, EOMI, no exophtalmos or lid lag, no myxedema, no xanthelasma; normal ears, nose and oropharynx Neck: normal jugular venous pulsations and no hepatojugular reflux; brisk carotid pulses without delay, but with bilateral carotid bruits Chest: clear to auscultation, no signs of consolidation by percussion or palpation, normal fremitus, symmetrical and full respiratory  excursions Cardiovascular: normal position and quality of the apical impulse, regular rhythm, normal first and distinct second heart sounds, 3/6 mid to late peaking systolic ejection murmur in the aortic focus radiating to both carotids, no diastolic murmurs, rubs or gallops Abdomen: no tenderness or distention, no masses by palpation, no abnormal pulsatility or arterial bruits, normal bowel sounds, no hepatosplenomegaly Extremities: no clubbing, cyanosis or edema; 2+ radial, ulnar and brachial pulses bilaterally; 2+ right femoral, posterior tibial and dorsalis pedis pulses; 2+ left femoral, posterior tibial and dorsalis pedis pulses; no subclavian or femoral bruits Neurological: grossly nonfocal Psych: Normal mood and affect   ASSESSMENT:    1. Nonrheumatic aortic valve stenosis   2. Contrast media allergy   3. AAA (abdominal aortic aneurysm) without rupture (H[facility])   4. Class 2 severe obesity due to excess calories with serious comorbidity and body mass index (BMI) of 39.0 to 39.9 in adult ([place])   5. Diabetes mellitus type 2 in obese ([place])   6. Hypercholesterolemia   7. Essential hypertension, benign   8. COPD, group B, by GOLD 2017 classification ([place])   9. Cirrhosis of liver without ascites, unspecified hepatic cirrhosis type ([place])   10. Pre-op testing    PLAN:    In order of problems listed above:  1. AS: She does not have exertional symptoms, but she is also very sedentary due to her hip problems.  Physical exam and echo both suggest progression to severe aortic stenosis and its likely that she will become symptomatic within the next 6-12 months.  Shortness of breath at rest is unlikely to be secondary to the valvular problem and could be due to COPD.  Recommend that we begin the work-up for aortic  valve replacement with cardiac catheterization and CT of the chest abdomen and pelvis.  I think she would be better suited for TAVR due to her age, chronic lung disease, borderline-morbid  obesity and limitations in physical activity.  The right and left heart catheterization procedure was discussed in detail.  She is scheduled with Dr. Angelena Form next week. This procedure has been fully reviewed with the patient and informed consent has been obtained. 2. Contrast allergy: She will require premedication before coronary angiography or CT. probably safer to see how she first does with the coronary angiogram since she will receive smaller aliquots of contrast and will have very close supervision. 3. AAA: Small at 3 cm and unchanged in size over the years.  Unlikely to interfere with transfemoral access for TAVR.  She no longer smokes. 4. Obesity: She had made good progress with weight loss dropping her BMI from 43 to 36 before the pandemic, but has now gained back weight and has a BMI of 39.. Successful additional weight loss between now and the TAVR procedure would allow for a lower risk of complications and quicker recovery. 5. DM: Excellent control with low-dose Metformin as the only therapy 6. HLP: All lipid parameters are in target range on the current statin prescription. 7. HTN: Blood pressure is a little high today, but she was clearly nervous in anticipation of the discussion for aortic stenosis.  Usually well controlled on longstanding Ziac.  No changes made to her medications. 8. COPD: Reported prior FEV1 of 52%.  Tolerated surgery for ventral hernia repair in 2018 without major difficulties.  Followed in pulmonology clinic.  Recent CT of the chest without contrast shows no evidence of lung cancer. 9. Imaging studies suggest cirrhosis: No known history of liver disease, normal albumin and prothrombin time on recent labs, no signs or symptoms of decompensated liver function or portal hypertension.  Suspect that she may have NASH.   Medication Adjustments/Labs and Tests Ordered: Current medicines are reviewed at length with the patient today.  Concerns regarding medicines are outlined  above.  Orders Placed This Encounter  Procedures  . CBC  . Basic metabolic panel  . EKG 12-Lead   Meds ordered this encounter  Medications  . predniSONE (DELTASONE) 50 MG tablet    Sig: 13 hours prior to your procedure time take one Prednisone 50 mg tablet, then 7 hours prior to your procedure time take one Prednisone 50 mg tablet, and then 1-2 hours prior to procedure time (just before you leave for the hospital) take one Prednisone 50 mg tablet    Dispense:  3 tablet    Refill:  0  . diphenhydrAMINE (BENADRYL) 50 MG tablet    Sig: Take one 50 mg tablet the morning of the procedure with the last dose of the prednisone.    Dispense:  1 tablet    Refill:  0    Patient Instructions  Medication Instructions:  No changes  *If you need a refill on your cardiac medications before your next appointment, please call your pharmacy*  Testing/Procedures: Your physician has requested that you have a cardiac catheterization. Cardiac catheterization is used to diagnose and/or treat various heart conditions. Doctors may recommend this procedure for a number of different reasons. The most common reason is to evaluate chest pain. Chest pain can be a symptom of coronary artery disease (CAD), and cardiac catheterization can show whether plaque is narrowing or blocking your heart's arteries. This procedure is also used to evaluate the valves, as  well as measure the blood flow and oxygen levels in different parts of your heart. For further information please visit [email or web address]. Please follow instruction sheet, as given.  Follow-Up: At [facility] you and your health needs are our priority.  As part of our continuing mission to provide you with exceptional heart care, we have created designated Provider Care Teams.  These Care Teams include your primary Cardiologist (physician) and Advanced Practice Providers (APPs -  Physician Assistants and Nurse Practitioners) who all work together to provide  you with the care you need, when you need it.  We recommend signing up for the patient portal called "MyChart".  Sign up information is provided on this After Visit Summary.  MyChart is used to connect with patients for Virtual Visits (Telemedicine).  Patients are able to view lab/test results, encounter notes, upcoming appointments, etc.  Non-urgent messages can be sent to your provider as well.   To learn more about what you can do with MyChart, go to [email or web address].    Your next appointment:   3 month(s)  The format for your next appointment:   In Person  Provider:   Sanda Klein, MD   Other Instructions    [place] [place] [address] Dept: [phone] Loc: [facility]  06/25/2020  You are scheduled for a Cardiac Catheterization on Thursday, January 6 with Dr. Lauree Chandler.  1. Please arrive at the [facility] (Main Entrance A) at [facility]: [address] [place], [place] [address] at 5:30 AM (This time is two hours before your procedure to ensure your preparation). Free valet parking service is available.   Special note: Every effort is made to have your procedure done on time. Please understand that emergencies sometimes delay scheduled procedures.  2. Diet: Do not eat solid foods after midnight.  The patient may have clear liquids until 5am upon the day of the procedure.  3. Labs:  Your provider would like for you to return on 07/02/20 to have the following labs drawn: CBC and BMET. You do not need an appointment for the lab. Once in our office lobby there is a podium where you can sign in and ring the doorbell to alert [place] that you are here. The lab is open from 8:00 am to 4:30 pm; closed for lunch from 12:45pm-1:45pm.  You will need to have the coronavirus test completed prior to your procedure. An appointment has been  made at 12:25 pm on 07/02/20. This is a Drive Up Visit at [address], [place], [place] [address]. Please tell them that you are there for procedure testing. Stay in your car and someone will be with you shortly. Please make sure to have all other labs completed before this test because you will need to stay quarantined until your procedure.   4. Medication instructions in preparation for your procedure: Hold the Furosemide the morning of the procedure Hold the Bisoprolol-Hydrochlorothiazide the morning of the procedure Hold the Metformin the morning of the procedure and 48 hours after. Hold all diabetic medication the morning of the procedure.  Contrast Allergy:  Please take Prednisone 36m by mouth at: Thirteen hours prior to cath  Seven hours prior to cath  And prior to leaving home please take last dose of Prednisone 574mand Benadryl 5044my mouth.   On the morning of your procedure, take your Aspirin and any morning medicines NOT listed  above.  You may use sips of water.  5. Plan for one night stay--bring personal belongings. 6. Bring a current list of your medications and current insurance cards. 7. You MUST have a responsible person to drive you home. 8. Someone MUST be with you the first 24 hours after you arrive home or your discharge will be delayed. 9. Please wear clothes that are easy to get on and off and wear slip-on shoes.  Thank you for allowing [place] to care for you!   -- [facility]      Signed, Sanda Klein, MD  06/26/2020 8:50 AM    [place]

## 2020-06-25 NOTE — H&P (View-Only) (Signed)
Cardiology Office Note:    Date:  06/26/2020   ID:  Shannon Schultz, DOB 1946-04-25, MRN [id number]  PCP:  [name], MD  Cardiologist:   Sanda Klein, MD Electrophysiologist:  None  Referring MD: [name], MD   Chief Complaint  Patient presents with  . Cardiac Valve Problem    History of Present Illness:    Shannon Schultz is a 74 y.o. female with a hx of aortic valve stenosis, hypertension, severe obesity and COPD.  She had a normal nuclear stress test in 2016.  She has a history of a severe contrast allergy following the CT scan of the abdomen. She has chronic exertional shortness of breath, NYHA functional class II.  This is generally well controlled with inhalers.  She has been fairly sedentary due to a slow recovery from hip surgery almost 1 year ago.  She denies exertional angina or syncope.  Her shortness of breath has not really changed.  She does not have orthopnea or PND.  She denies palpitations.  She denies edema of the lower extremities.  Her echocardiogram 06/09/2018 showed moderate aortic stenosis with peak gradient of 52 mmHg, mean gradient 30 mmHg and a calculated aortic valve area of 1.2 cm, dimensionless index however was in the severe range of 0.25.  Follow-up echocardiogram performed 06/19/2020 shows clear evidence of progression to severe aortic stenosis.  A recent noncontrast CT of the abdomen has shown evidence of a small AAA at 3.0 cm (unchanged from previous CT studies).  Noncontrast CT of the chest as a screening tool for lung cancer did show evidence of coronary and aortic atherosclerosis.  Both imaging tests have shown a nodular contour to the liver suggestive of possible cirrhosis.   Past Medical History:  Diagnosis Date  . Arthritis    "hands" (05/15/2017)  . Bladder cancer ([place]) 2018  . Breast cancer, right ([place]) 1992   DCIS,bladder ca (just dx)  . Colon polyp   . Complication of anesthesia 1992   "local anesthesia" used was hard to awaken  from-no problems since (05/15/2017)  . COPD (chronic obstructive pulmonary disease) ([place])   . COVID-19 virus infection 05/05/2019  . Diverticulosis   . Elevated cholesterol   . Elevated liver enzymes    fatty liver per ultrasound per pt  . Environmental allergies    "I have allergies year round" (05/15/2017)  . Family history of malignant neoplasm of breast   . FHx: BRCA2 gene positive    sister with BRCA2 mutation (pt tested NEGATIVE)  . Genital HSV    gets on hip  . HSV (herpes simplex virus) infection    on hip--on daily suppression  . Hypertension   . Hypothyroidism    took med 7 yrs after birth of 1st child  . Impaired glucose tolerance   . Migraine   . On home oxygen therapy    "have it available but I'm not using it" (05/15/2017)  . Osteopenia   . Pneumonia ~ 1950; 08/06/2010  . Severe aortic stenosis   . Type 2 diabetes mellitus ([place])   . Ventral hernia   . Vitamin D deficiency disease     Past Surgical History:  Procedure Laterality Date  . ABDOMINAL HERNIA REPAIR  05/15/2017  . BREAST BIOPSY Right 1992  . BREAST BIOPSY Left 2018  . BREAST EXCISIONAL BIOPSY Left 2018   ATYPICAL DUCTAL HYPERPLASIA INVOLVING A COMPLEX  . BREAST LUMPECTOMY WITH RADIOACTIVE SEED LOCALIZATION Left 12/22/2016   Procedure: LEFT BREAST LUMPECTOMY  WITH RADIOACTIVE SEED LOCALIZATION;  Surgeon: Jovita Kussmaul, MD;  Location: [facility];  Service: General;  Laterality: Left;  . CATARACT EXTRACTION, BILATERAL Bilateral R 03/16/2019 L 03/30/2019   Dr. Kathlen Mody  . COLONOSCOPY  2009, 08/2010, 12/2015   Dr. Collene Mares; "only 1 had any polyps" (05/15/2017)  . CYSTOSCOPY  04/23/2017  . CYSTOSCOPY W/ RETROGRADES Bilateral 01/12/2017   Procedure: CYSTOSCOPY WITH RETROGRADE PYELOGRAM/ EXAM UNDER ANESTHESIA;  Surgeon: Raynelle Bring, MD;  Location: [facility];  Service: Urology;  Laterality: Bilateral;  . FEMUR IM NAIL Right 08/24/2019   Procedure: INTRAMEDULLARY (IM) NAIL FEMORAL;  Surgeon: Rod Can, MD;  Location:  [facility];  Service: Orthopedics;  Laterality: Right;  . HERNIA REPAIR    . INSERTION OF MESH N/A 05/15/2017   Procedure: INSERTION OF MESH;  Surgeon: Jovita Kussmaul, MD;  Location: [facility];  Service: General;  Laterality: N/A;  . LAPAROSCOPIC CHOLECYSTECTOMY  2003  . MASTECTOMY Right 1992  . TRANSURETHRAL RESECTION OF BLADDER TUMOR WITH MITOMYCIN-C N/A 01/12/2017   Procedure: TRANSURETHRAL RESECTION OF BLADDER TUMOR WITH POSSIBLE POST OPERATIVE INSTILLATION OF MITOMYCIN-C;  Surgeon: Raynelle Bring, MD;  Location: [facility];  Service: Urology;  Laterality: N/A;  . TYMPANOSTOMY TUBE PLACEMENT Bilateral 1980s  . UMBILICAL HERNIA REPAIR  2003  . VENTRAL HERNIA REPAIR N/A 05/15/2017   Procedure: VENTRAL HERNIA REPAIR WITH MESH;  Surgeon: Jovita Kussmaul, MD;  Location: [facility];  Service: General;  Laterality: N/A;    Current Medications: Current Meds  Medication Sig  . Accu-Chek Softclix Lancets lancets Test BS 1-2 times daily. Pt uses accu-chek meter  . acetaminophen (TYLENOL) 500 MG tablet Take 1,000 mg by mouth every 6 (six) hours as needed for moderate pain or headache.  . albuterol (PROVENTIL) (2.5 MG/3ML) 0.083% nebulizer solution USE 1 VIAL BY NEBULIZATION EVERY FOUR HOURS AS NEEDED FOR WHEEZING OR SHORTNESS OF BREATH.  Marland Kitchen albuterol (VENTOLIN HFA) 108 (90 Base) MCG/ACT inhaler Inhale 2 puffs into the lungs every 6 (six) hours as needed for wheezing or shortness of breath.  . Alcohol Swabs (B-D SINGLE USE SWABS REGULAR) PADS Use twice a day when checking blood sugars  . aspirin EC 81 MG tablet Take 81 mg daily by mouth.  . bisoprolol-hydrochlorothiazide (ZIAC) 10-6.25 MG tablet Take 1 tablet by mouth daily.  . Calcium Carbonate (CALCIUM 600 PO) Take 2 capsules by mouth daily.  Marland Kitchen CALCIUM-MAGNESIUM-VITAMIN D PO Take 1 tablet by mouth in the morning and at bedtime.   . cetirizine (ZYRTEC) 10 MG tablet Take 10 mg by mouth daily.  . diphenhydrAMINE (BENADRYL) 50 MG tablet Take one 50 mg tablet the morning  of the procedure with the last dose of the prednisone.  . fluticasone (FLONASE) 50 MCG/ACT nasal spray Place 2 sprays into both nostrils daily.  . furosemide (LASIX) 20 MG tablet TAKE 1 TABLET (20 MG TOTAL) BY MOUTH DAILY AS NEEDED FOR FLUID OR EDEMA.  Marland Kitchen glucose blood (ACCU-CHEK AVIVA PLUS) test strip TEST BLOOD SUGAR ONE TO TWO TIMES DAILY  . metFORMIN (GLUCOPHAGE-XR) 500 MG 24 hr tablet Take 1 tablet (500 mg total) by mouth 2 (two) times daily before a meal.  . montelukast (SINGULAIR) 10 MG tablet Take 1 tablet (10 mg total) by mouth at bedtime.  . predniSONE (DELTASONE) 50 MG tablet 13 hours prior to your procedure time take one Prednisone 50 mg tablet, then 7 hours prior to your procedure time take one Prednisone 50 mg tablet, and then 1-2 hours prior to procedure time (  just before you leave for the hospital) take one Prednisone 50 mg tablet  . simvastatin (ZOCOR) 20 MG tablet Take 1 tablet (20 mg total) by mouth at bedtime.  . triamcinolone cream (KENALOG) 0.1 % Apply 1 application topically 2 (two) times daily as needed (redness).   . TRUEplus Lancets 30G MISC TEST BLOOD SUGAR ONE TO TWO TIMES DAILY  . umeclidinium-vilanterol (ANORO ELLIPTA) 62.5-25 MCG/INH AEPB Inhale 1 puff into the lungs daily.     Allergies:   Contrast media [iodinated diagnostic agents], Iohexol, Lisinopril, Sulfa antibiotics, Latex, Codeine, Levofloxacin, Lipitor [atorvastatin calcium], and Vicodin [hydrocodone-acetaminophen]   Social History   Socioeconomic History  . Marital status: Married    Spouse name: Not on file  . Number of children: 3  . Years of education: Not on file  . Highest education level: Not on file  Occupational History  . Occupation: Retired  Tobacco Use  . Smoking status: Former Smoker    Packs/day: 1.00    Years: 46.00    Pack years: 46.00    Types: Cigarettes    Quit date: 05/31/2011    Years since quitting: 9.0  . Smokeless tobacco: Never Used  Vaping Use  . Vaping Use: Never  used  Substance and Sexual Activity  . Alcohol use: No  . Drug use: No  . Sexual activity: Yes    Partners: Male    Birth control/protection: Post-menopausal  Other Topics Concern  . Not on file  Social History Narrative   Lives with her husband.  Children all live in [place] nearby. No pets. 6 grandchildren.   Social Determinants of Health   Financial Resource Strain: Not on file  Food Insecurity: Not on file  Transportation Needs: Not on file  Physical Activity: Not on file  Stress: Not on file  Social Connections: Not on file     Family History: The patient's family history includes Allergies in her sister and sister; Asthma in her sister and sister; Breast cancer (age of onset: 26) in her sister; Cancer in her maternal grandmother and another family member; Diabetes in her father; Fibromyalgia in her sister and sister; Hashimoto's thyroiditis in her sister and sister; Heart disease in her father and mother; Hyperlipidemia in her sister and sister; Hypertension in her father; Stroke (age of onset: 65) in her paternal grandmother; Tuberculosis in her maternal grandfather.  ROS:   Please see the history of present illness.    All other systems are reviewed and are negative.   EKGs/Labs/Other Studies Reviewed:    The following studies were reviewed today:  ECHO 06/19/2020  Study Result  1. Left ventricular ejection fraction, by estimation, is 65 to 70%. The  left ventricle has normal function. The left ventricle has no regional  wall motion abnormalities. There is mild left ventricular hypertrophy.  Left ventricular diastolic parameters  are consistent with Grade I diastolic dysfunction (impaired relaxation).  2. Right ventricular systolic function is normal. The right ventricular  size is normal. Tricuspid regurgitation signal is inadequate for assessing  PA pressure.  3. The mitral valve is abnormal. Trivial mitral valve regurgitation.  4. The aortic valve is tricuspid.  Aortic valve regurgitation is not  visualized. Severe aortic valve stenosis. Aortic valve area, by VTI  measures 0.70 cm. Aortic valve mean gradient measures 38.0 mmHg. Aortic  valve Vmax measures 3.83 m/s. Peak gradient  59 mmHg. DI is 0.22.  5. The inferior vena cava is normal in size with greater than 50%  respiratory variability,  suggesting right atrial pressure of 3 mmHg.  6. Left atrial size was mildly dilated.   Comparison(s): Changes from prior study are noted. 06/29/19: LVEF 65-70%,  moderate to severe AS - mean gradient 29 mmHg, DI 0.26.       EKG:  EKG is  ordered today.  The ekg ordered today demonstrates normal sinus rhythm with a QS pattern in leads V1 and V2 and borderline QTC 4 to 67 ms, otherwise normal  Recent Labs: 10/03/2019: Hemoglobin 12.2; Platelets 155 04/23/2020: ALT 20; BUN 17; Creatinine, Ser 0.75; Potassium 4.3; Sodium 139; TSH 3.150  Recent Lipid Panel    Component Value Date/Time   CHOL 116 10/03/2019 0850   TRIG 101 10/03/2019 0850   HDL 54 10/03/2019 0850   CHOLHDL 2.1 10/03/2019 0850   CHOLHDL 2.2 08/18/2016 1346   VLDL 19 08/18/2016 1346   LDLCALC 43 10/03/2019 0850    Physical Exam:    VS:  BP (!) 152/85   Pulse 88   Ht _0  (1.626 m)   Wt 227 lb 12.8 oz (103.3 kg)   LMP  (LMP Unknown)   SpO2 93%   BMI 39.10 kg/m     Wt Readings from Last 3 Encounters:  06/25/20 227 lb 12.8 oz (103.3 kg)  04/26/20 222 lb 9.6 oz (101 kg)  03/30/20 219 lb 4 oz (99.5 kg)    General: Alert, oriented x3, no distress, severely obese Head: no evidence of trauma, PERRL, EOMI, no exophtalmos or lid lag, no myxedema, no xanthelasma; normal ears, nose and oropharynx Neck: normal jugular venous pulsations and no hepatojugular reflux; brisk carotid pulses without delay, but with bilateral carotid bruits Chest: clear to auscultation, no signs of consolidation by percussion or palpation, normal fremitus, symmetrical and full respiratory  excursions Cardiovascular: normal position and quality of the apical impulse, regular rhythm, normal first and distinct second heart sounds, 3/6 mid to late peaking systolic ejection murmur in the aortic focus radiating to both carotids, no diastolic murmurs, rubs or gallops Abdomen: no tenderness or distention, no masses by palpation, no abnormal pulsatility or arterial bruits, normal bowel sounds, no hepatosplenomegaly Extremities: no clubbing, cyanosis or edema; 2+ radial, ulnar and brachial pulses bilaterally; 2+ right femoral, posterior tibial and dorsalis pedis pulses; 2+ left femoral, posterior tibial and dorsalis pedis pulses; no subclavian or femoral bruits Neurological: grossly nonfocal Psych: Normal mood and affect   ASSESSMENT:    1. Nonrheumatic aortic valve stenosis   2. Contrast media allergy   3. AAA (abdominal aortic aneurysm) without rupture (H[facility])   4. Class 2 severe obesity due to excess calories with serious comorbidity and body mass index (BMI) of 39.0 to 39.9 in adult ([place])   5. Diabetes mellitus type 2 in obese ([place])   6. Hypercholesterolemia   7. Essential hypertension, benign   8. COPD, group B, by GOLD 2017 classification ([place])   9. Cirrhosis of liver without ascites, unspecified hepatic cirrhosis type ([place])   10. Pre-op testing    PLAN:    In order of problems listed above:  1. AS: She does not have exertional symptoms, but she is also very sedentary due to her hip problems.  Physical exam and echo both suggest progression to severe aortic stenosis and its likely that she will become symptomatic within the next 6-12 months.  Shortness of breath at rest is unlikely to be secondary to the valvular problem and could be due to COPD.  Recommend that we begin the work-up for aortic  valve replacement with cardiac catheterization and CT of the chest abdomen and pelvis.  I think she would be better suited for TAVR due to her age, chronic lung disease, borderline-morbid  obesity and limitations in physical activity.  The right and left heart catheterization procedure was discussed in detail.  She is scheduled with Dr. Angelena Form next week. This procedure has been fully reviewed with the patient and informed consent has been obtained. 2. Contrast allergy: She will require premedication before coronary angiography or CT. probably safer to see how she first does with the coronary angiogram since she will receive smaller aliquots of contrast and will have very close supervision. 3. AAA: Small at 3 cm and unchanged in size over the years.  Unlikely to interfere with transfemoral access for TAVR.  She no longer smokes. 4. Obesity: She had made good progress with weight loss dropping her BMI from 43 to 36 before the pandemic, but has now gained back weight and has a BMI of 39.. Successful additional weight loss between now and the TAVR procedure would allow for a lower risk of complications and quicker recovery. 5. DM: Excellent control with low-dose Metformin as the only therapy 6. HLP: All lipid parameters are in target range on the current statin prescription. 7. HTN: Blood pressure is a little high today, but she was clearly nervous in anticipation of the discussion for aortic stenosis.  Usually well controlled on longstanding Ziac.  No changes made to her medications. 8. COPD: Reported prior FEV1 of 52%.  Tolerated surgery for ventral hernia repair in 2018 without major difficulties.  Followed in pulmonology clinic.  Recent CT of the chest without contrast shows no evidence of lung cancer. 9. Imaging studies suggest cirrhosis: No known history of liver disease, normal albumin and prothrombin time on recent labs, no signs or symptoms of decompensated liver function or portal hypertension.  Suspect that she may have NASH.   Medication Adjustments/Labs and Tests Ordered: Current medicines are reviewed at length with the patient today.  Concerns regarding medicines are outlined  above.  Orders Placed This Encounter  Procedures  . CBC  . Basic metabolic panel  . EKG 12-Lead   Meds ordered this encounter  Medications  . predniSONE (DELTASONE) 50 MG tablet    Sig: 13 hours prior to your procedure time take one Prednisone 50 mg tablet, then 7 hours prior to your procedure time take one Prednisone 50 mg tablet, and then 1-2 hours prior to procedure time (just before you leave for the hospital) take one Prednisone 50 mg tablet    Dispense:  3 tablet    Refill:  0  . diphenhydrAMINE (BENADRYL) 50 MG tablet    Sig: Take one 50 mg tablet the morning of the procedure with the last dose of the prednisone.    Dispense:  1 tablet    Refill:  0    Patient Instructions  Medication Instructions:  No changes  *If you need a refill on your cardiac medications before your next appointment, please call your pharmacy*  Testing/Procedures: Your physician has requested that you have a cardiac catheterization. Cardiac catheterization is used to diagnose and/or treat various heart conditions. Doctors may recommend this procedure for a number of different reasons. The most common reason is to evaluate chest pain. Chest pain can be a symptom of coronary artery disease (CAD), and cardiac catheterization can show whether plaque is narrowing or blocking your heart's arteries. This procedure is also used to evaluate the valves, as  well as measure the blood flow and oxygen levels in different parts of your heart. For further information please visit [email or web address]. Please follow instruction sheet, as given.  Follow-Up: At [facility] you and your health needs are our priority.  As part of our continuing mission to provide you with exceptional heart care, we have created designated Provider Care Teams.  These Care Teams include your primary Cardiologist (physician) and Advanced Practice Providers (APPs -  Physician Assistants and Nurse Practitioners) who all work together to provide  you with the care you need, when you need it.  We recommend signing up for the patient portal called "MyChart".  Sign up information is provided on this After Visit Summary.  MyChart is used to connect with patients for Virtual Visits (Telemedicine).  Patients are able to view lab/test results, encounter notes, upcoming appointments, etc.  Non-urgent messages can be sent to your provider as well.   To learn more about what you can do with MyChart, go to [email or web address].    Your next appointment:   3 month(s)  The format for your next appointment:   In Person  Provider:   Sanda Klein, MD   Other Instructions    [place] [address] Dept: [phone] Loc: [facility]  06/25/2020  You are scheduled for a Cardiac Catheterization on Thursday, January 6 with Dr. Lauree Chandler.  1. Please arrive at the [facility] (Main Entrance A) at [facility]: [address] [place], [place] [address] at 5:30 AM (This time is two hours before your procedure to ensure your preparation). Free valet parking service is available.   Special note: Every effort is made to have your procedure done on time. Please understand that emergencies sometimes delay scheduled procedures.  2. Diet: Do not eat solid foods after midnight.  The patient may have clear liquids until 5am upon the day of the procedure.  3. Labs:  Your provider would like for you to return on 07/02/20 to have the following labs drawn: CBC and BMET. You do not need an appointment for the lab. Once in our office lobby there is a podium where you can sign in and ring the doorbell to alert [place] that you are here. The lab is open from 8:00 am to 4:30 pm; closed for lunch from 12:45pm-1:45pm.  You will need to have the coronavirus test completed prior to your procedure. An appointment has been  made at 12:25 pm on 07/02/20. This is a Drive Up Visit at [address], [place], [place] [address]. Please tell them that you are there for procedure testing. Stay in your car and someone will be with you shortly. Please make sure to have all other labs completed before this test because you will need to stay quarantined until your procedure.   4. Medication instructions in preparation for your procedure: Hold the Furosemide the morning of the procedure Hold the Bisoprolol-Hydrochlorothiazide the morning of the procedure Hold the Metformin the morning of the procedure and 48 hours after. Hold all diabetic medication the morning of the procedure.  Contrast Allergy:  Please take Prednisone 36m by mouth at: Thirteen hours prior to cath  Seven hours prior to cath  And prior to leaving home please take last dose of Prednisone 574mand Benadryl 5044my mouth.   On the morning of your procedure, take your Aspirin and any morning medicines NOT listed  above.  You may use sips of water.  5. Plan for one night stay--bring personal belongings. 6. Bring a current list of your medications and current insurance cards. 7. You MUST have a responsible person to drive you home. 8. Someone MUST be with you the first 24 hours after you arrive home or your discharge will be delayed. 9. Please wear clothes that are easy to get on and off and wear slip-on shoes.  Thank you for allowing [place] to care for you!   -- [facility]      Signed, Sanda Klein, MD  06/26/2020 8:50 AM    [place]

## 2020-06-26 ENCOUNTER — Encounter: Payer: Self-pay | Admitting: Cardiovascular Disease

## 2020-06-26 DIAGNOSIS — I714 Abdominal aortic aneurysm, without rupture, unspecified: Secondary | ICD-10-CM | POA: Insufficient documentation

## 2020-06-26 DIAGNOSIS — Z91041 Radiographic dye allergy status: Secondary | ICD-10-CM | POA: Insufficient documentation

## 2020-07-02 ENCOUNTER — Other Ambulatory Visit (HOSPITAL_COMMUNITY)
Admission: RE | Admit: 2020-07-02 | Discharge: 2020-07-02 | Disposition: A | Discharge status: Home / Self Care | Payer: Medicare HMO | Source: Ambulatory Visit | Attending: Cardiovascular Disease | Admitting: Cardiovascular Disease

## 2020-07-02 DIAGNOSIS — Z01818 Encounter for other preprocedural examination: Secondary | ICD-10-CM | POA: Insufficient documentation

## 2020-07-02 DIAGNOSIS — Z20822 Contact with and (suspected) exposure to covid-19: Secondary | ICD-10-CM | POA: Insufficient documentation

## 2020-07-02 DIAGNOSIS — I350 Nonrheumatic aortic (valve) stenosis: Secondary | ICD-10-CM | POA: Diagnosis not present

## 2020-07-03 ENCOUNTER — Telehealth: Payer: Self-pay | Admitting: *Deleted

## 2020-07-03 LAB — BASIC METABOLIC PANEL
BUN/Creatinine Ratio: 21 (ref 12–28)
BUN: 15 mg/dL (ref 8–27)
CO2: 27 mmol/L (ref 20–29)
Calcium: 9.8 mg/dL (ref 8.7–10.3)
Chloride: 101 mmol/L (ref 96–106)
Creatinine, Ser: 0.71 mg/dL (ref 0.57–1.00)
GFR calc Af Amer: 97 mL/min/{1.73_m2} (ref >59)
GFR calc non Af Amer: 84 mL/min/{1.73_m2} (ref >59)
Glucose: 157 mg/dL — ABNORMAL HIGH (ref 65–99)
Potassium: 4.6 mmol/L (ref 3.5–5.2)
Sodium: 142 mmol/L (ref 134–144)

## 2020-07-03 LAB — CBC
Hematocrit: 39 % (ref 34.0–46.6)
Hemoglobin: 13.2 g/dL (ref 11.1–15.9)
MCH: 29.2 pg (ref 26.6–33.0)
MCHC: 33.8 g/dL (ref 31.5–35.7)
MCV: 86 fL (ref 79–97)
Platelets: 122 10*3/uL — ABNORMAL LOW (ref 150–450)
RBC: 4.52 x10E6/uL (ref 3.77–5.28)
RDW: 14.2 % (ref 11.7–15.4)
WBC: 4.7 10*3/uL (ref 3.4–10.8)

## 2020-07-03 LAB — SARS CORONAVIRUS 2 (TAT 6-24 HRS): SARS Coronavirus 2: NEGATIVE

## 2020-07-03 NOTE — Telephone Encounter (Signed)
Pt contacted pre-catheterization scheduled at [facility] for: Thursday July 06, 2019 7:30 AM Verified arrival time and place: [facility] Main Entrance A [facility]) at: 5:30 AM   No solid food after midnight prior to cath, clear liquids until 5 AM day of procedure.   CONTRAST ALLERGY: yes- 13 hour Prednisone and Benadryl Prep reviewed with patient: 07/04/20 Prednisone 50 mg 6:30 PM 07/05/20 Prednisone 50 mg 12:30 AM 07/05/20 Prednisone 50 mg and Benadryl 50 mg just prior to leaving for hospital AM of procedure.  Hold: Metformin-day of procedure and 48 hours  Bisoprolol/HCT -AM of procedure Lasix-AM of procedure  Except hold medications AM meds can be  taken pre-cath with sips of water including: ASA 81 mg   Confirmed patient has responsible adult to drive home post procedure and be with patient first 24 hours after arriving home: yes  You are allowed ONE visitor in the waiting room during the time you are at the hospital for your procedure. Both you and your visitor must wear a mask once you enter the hospital.       COVID-19 Pre-Screening Questions:  . In the past 14 days have you had any symptoms concerning for COVID-19 infection (fever, chills, cough, or new shortness of breath)? no . In the past 14 days have you been around anyone with known Covid 19? no   Reviewed procedure/mask/visitor instructions, COVID-19 questions with patient.

## 2020-07-04 ENCOUNTER — Other Ambulatory Visit: Payer: Self-pay | Admitting: Family Medicine

## 2020-07-05 ENCOUNTER — Encounter (HOSPITAL_COMMUNITY)
Admission: RE | Disposition: A | Discharge status: Home / Self Care | Payer: Medicare HMO | Source: Home / Self Care | Attending: Cardiovascular Disease

## 2020-07-05 ENCOUNTER — Ambulatory Visit (HOSPITAL_COMMUNITY)
Admission: RE | Admit: 2020-07-05 | Discharge: 2020-07-05 | Disposition: A | Discharge status: Home / Self Care | Payer: Medicare HMO | Attending: Cardiovascular Disease | Admitting: Cardiovascular Disease

## 2020-07-05 ENCOUNTER — Encounter (HOSPITAL_COMMUNITY): Payer: Self-pay | Admitting: Cardiovascular Disease

## 2020-07-05 ENCOUNTER — Other Ambulatory Visit: Payer: Self-pay

## 2020-07-05 DIAGNOSIS — J449 Chronic obstructive pulmonary disease, unspecified: Secondary | ICD-10-CM | POA: Diagnosis not present

## 2020-07-05 DIAGNOSIS — Z7984 Long term (current) use of oral hypoglycemic drugs: Secondary | ICD-10-CM | POA: Diagnosis not present

## 2020-07-05 DIAGNOSIS — E7800 Pure hypercholesterolemia, unspecified: Secondary | ICD-10-CM | POA: Insufficient documentation

## 2020-07-05 DIAGNOSIS — E669 Obesity, unspecified: Secondary | ICD-10-CM | POA: Diagnosis not present

## 2020-07-05 DIAGNOSIS — E785 Hyperlipidemia, unspecified: Secondary | ICD-10-CM | POA: Insufficient documentation

## 2020-07-05 DIAGNOSIS — I10 Essential (primary) hypertension: Secondary | ICD-10-CM | POA: Insufficient documentation

## 2020-07-05 DIAGNOSIS — Z6839 Body mass index (BMI) 39.0-39.9, adult: Secondary | ICD-10-CM | POA: Diagnosis not present

## 2020-07-05 DIAGNOSIS — K7460 Unspecified cirrhosis of liver: Secondary | ICD-10-CM | POA: Insufficient documentation

## 2020-07-05 DIAGNOSIS — E119 Type 2 diabetes mellitus without complications: Secondary | ICD-10-CM | POA: Diagnosis not present

## 2020-07-05 DIAGNOSIS — I2510 Atherosclerotic heart disease of native coronary artery without angina pectoris: Secondary | ICD-10-CM

## 2020-07-05 DIAGNOSIS — Z7982 Long term (current) use of aspirin: Secondary | ICD-10-CM | POA: Insufficient documentation

## 2020-07-05 DIAGNOSIS — Z87891 Personal history of nicotine dependence: Secondary | ICD-10-CM | POA: Diagnosis not present

## 2020-07-05 DIAGNOSIS — Z79899 Other long term (current) drug therapy: Secondary | ICD-10-CM | POA: Diagnosis not present

## 2020-07-05 DIAGNOSIS — Z91041 Radiographic dye allergy status: Secondary | ICD-10-CM | POA: Diagnosis not present

## 2020-07-05 DIAGNOSIS — I350 Nonrheumatic aortic (valve) stenosis: Secondary | ICD-10-CM

## 2020-07-05 DIAGNOSIS — I714 Abdominal aortic aneurysm, without rupture: Secondary | ICD-10-CM | POA: Diagnosis not present

## 2020-07-05 HISTORY — PX: RIGHT/LEFT HEART CATH AND CORONARY ANGIOGRAPHY: CATH118266

## 2020-07-05 LAB — POCT I-STAT EG7
Acid-base deficit: 2 mmol/L (ref 0.0–2.0)
Bicarbonate: 23 mmol/L (ref 20.0–28.0)
Calcium, Ion: 1.21 mmol/L (ref 1.15–1.40)
HCT: 37 % (ref 36.0–46.0)
Hemoglobin: 12.6 g/dL (ref 12.0–15.0)
O2 Saturation: 84 %
Potassium: 3.7 mmol/L (ref 3.5–5.1)
Sodium: 143 mmol/L (ref 135–145)
TCO2: 24 mmol/L (ref 22–32)
pCO2, Ven: 40 mmHg — ABNORMAL LOW (ref 44.0–60.0)
pH, Ven: 7.368 (ref 7.250–7.430)
pO2, Ven: 50 mmHg — ABNORMAL HIGH (ref 32.0–45.0)

## 2020-07-05 LAB — POCT I-STAT 7, (LYTES, BLD GAS, ICA,H+H)
Acid-base deficit: 2 mmol/L (ref 0.0–2.0)
Bicarbonate: 23.1 mmol/L (ref 20.0–28.0)
Calcium, Ion: 1.25 mmol/L (ref 1.15–1.40)
HCT: 37 % (ref 36.0–46.0)
Hemoglobin: 12.6 g/dL (ref 12.0–15.0)
O2 Saturation: 96 %
Potassium: 3.7 mmol/L (ref 3.5–5.1)
Sodium: 143 mmol/L (ref 135–145)
TCO2: 24 mmol/L (ref 22–32)
pCO2 arterial: 41.4 mmHg (ref 32.0–48.0)
pH, Arterial: 7.354 (ref 7.350–7.450)
pO2, Arterial: 83 mmHg (ref 83.0–108.0)

## 2020-07-05 LAB — GLUCOSE, CAPILLARY: Glucose-Capillary: 246 mg/dL — ABNORMAL HIGH (ref 70–99)

## 2020-07-05 SURGERY — RIGHT/LEFT HEART CATH AND CORONARY ANGIOGRAPHY
Anesthesia: LOCAL

## 2020-07-05 MED ORDER — ASPIRIN 81 MG PO CHEW: 81.0000 mg | CHEWABLE_TABLET | ORAL | Status: AC

## 2020-07-05 MED ORDER — HEPARIN SODIUM (PORCINE) 1000 UNIT/ML IJ SOLN
INTRAMUSCULAR | Status: DC | PRN
  Administered 2020-07-05: 5000 [IU] via INTRAVENOUS

## 2020-07-05 MED ORDER — HEPARIN SODIUM (PORCINE) 1000 UNIT/ML IJ SOLN
INTRAMUSCULAR | Status: AC
  Filled 2020-07-05: qty 1

## 2020-07-05 MED ORDER — FENTANYL CITRATE (PF) 100 MCG/2ML IJ SOLN
INTRAMUSCULAR | Status: DC | PRN
  Administered 2020-07-05: 25 ug via INTRAVENOUS

## 2020-07-05 MED ORDER — SODIUM CHLORIDE 0.9 % WEIGHT BASED INFUSION
3.0000 mL/kg/h | INTRAVENOUS | Status: AC
  Administered 2020-07-05: 3 mL/kg/h via INTRAVENOUS

## 2020-07-05 MED ORDER — SODIUM CHLORIDE 0.9% FLUSH: 3.0000 mL | INTRAVENOUS | Status: DC | PRN

## 2020-07-05 MED ORDER — LIDOCAINE HCL (PF) 1 % IJ SOLN
INTRAMUSCULAR | Status: AC
  Filled 2020-07-05: qty 30

## 2020-07-05 MED ORDER — HEPARIN (PORCINE) IN NACL 1000-0.9 UT/500ML-% IV SOLN
INTRAVENOUS | Status: AC
  Filled 2020-07-05: qty 1500

## 2020-07-05 MED ORDER — SODIUM CHLORIDE 0.9% FLUSH: 3.0000 mL | Freq: Two times a day (BID) | INTRAVENOUS | Status: DC

## 2020-07-05 MED ORDER — HEPARIN (PORCINE) IN NACL 1000-0.9 UT/500ML-% IV SOLN
INTRAVENOUS | Status: DC | PRN
  Administered 2020-07-05 (×2): 500 mL

## 2020-07-05 MED ORDER — VERAPAMIL HCL 2.5 MG/ML IV SOLN
INTRAVENOUS | Status: AC
  Filled 2020-07-05: qty 2

## 2020-07-05 MED ORDER — MIDAZOLAM HCL 2 MG/2ML IJ SOLN
INTRAMUSCULAR | Status: DC | PRN
  Administered 2020-07-05: 1 mg via INTRAVENOUS

## 2020-07-05 MED ORDER — SODIUM CHLORIDE 0.9 % WEIGHT BASED INFUSION: 1.0000 mL/kg/h | INTRAVENOUS | Status: DC

## 2020-07-05 MED ORDER — MIDAZOLAM HCL 2 MG/2ML IJ SOLN
INTRAMUSCULAR | Status: AC
  Filled 2020-07-05: qty 2

## 2020-07-05 MED ORDER — LIDOCAINE HCL (PF) 1 % IJ SOLN
INTRAMUSCULAR | Status: DC | PRN
  Administered 2020-07-05: 5 mL

## 2020-07-05 MED ORDER — HYDRALAZINE HCL 20 MG/ML IJ SOLN: 10.0000 mg | INTRAMUSCULAR | Status: DC | PRN

## 2020-07-05 MED ORDER — SODIUM CHLORIDE 0.9 % IV SOLN: 250.0000 mL | INTRAVENOUS | Status: DC | PRN

## 2020-07-05 MED ORDER — LABETALOL HCL 5 MG/ML IV SOLN: 10.0000 mg | INTRAVENOUS | Status: DC | PRN

## 2020-07-05 MED ORDER — ONDANSETRON HCL 4 MG/2ML IJ SOLN: 4.0000 mg | Freq: Four times a day (QID) | INTRAMUSCULAR | Status: DC | PRN

## 2020-07-05 MED ORDER — VERAPAMIL HCL 2.5 MG/ML IV SOLN
INTRAVENOUS | Status: DC | PRN
  Administered 2020-07-05: 10 mL via INTRA_ARTERIAL

## 2020-07-05 MED ORDER — IOHEXOL 350 MG/ML SOLN
INTRAVENOUS | Status: DC | PRN
  Administered 2020-07-05: 60 mL

## 2020-07-05 MED ORDER — FENTANYL CITRATE (PF) 100 MCG/2ML IJ SOLN
INTRAMUSCULAR | Status: AC
  Filled 2020-07-05: qty 2

## 2020-07-05 MED ORDER — SODIUM CHLORIDE 0.9 % IV SOLN: INTRAVENOUS | Status: AC

## 2020-07-05 SURGICAL SUPPLY — 13 items
CATH 5FR JL3.5 JR4 ANG PIG MP (CATHETERS) ×1 IMPLANT
CATH BALLN WEDGE 5F 110CM (CATHETERS) ×1 IMPLANT
CATH INFINITI 5 FR AL2 (CATHETERS) ×1 IMPLANT
DEVICE RAD COMP TR BAND LRG (VASCULAR PRODUCTS) ×1 IMPLANT
GLIDESHEATH SLEND SS 6F .021 (SHEATH) ×1 IMPLANT
GUIDEWIRE INQWIRE 1.5J.035X260 (WIRE) IMPLANT
INQWIRE 1.5J .035X260CM (WIRE) ×2
KIT HEART LEFT (KITS) ×2 IMPLANT
PACK CARDIAC CATHETERIZATION (CUSTOM PROCEDURE TRAY) ×2 IMPLANT
SHEATH GLIDE SLENDER 4/5FR (SHEATH) ×1 IMPLANT
TRANSDUCER W/STOPCOCK (MISCELLANEOUS) ×2 IMPLANT
TUBING CIL FLEX 10 FLL-RA (TUBING) ×2 IMPLANT
WIRE EMERALD ST .035X150CM (WIRE) ×1 IMPLANT

## 2020-07-05 NOTE — Discharge Instructions (Signed)
 HOLD METFORMIN FOR A FULL 48 HOURS AFTER DISCHARGE.  Radial Site Care  This sheet gives you information about how to care for yourself after your procedure. Your health care provider may also give you more specific instructions. If you have problems or questions, contact your health care provider. What can I expect after the procedure? After the procedure, it is common to have:  Bruising and tenderness at the catheter insertion area. Follow these instructions at home: Medicines  Take over-the-counter and prescription medicines only as told by your health care provider. Insertion site care 1. Follow instructions from your health care provider about how to take care of your insertion site. Make sure you: ? Wash your hands with soap and water before you change your bandage (dressing). If soap and water are not available, use hand sanitizer. ? Change your dressing as told by your health care provider. 2. Check your insertion site every day for signs of infection. Check for: ? Redness, swelling, or pain. ? Fluid or blood. ? Pus or a bad smell. ? Warmth. 3. Do not take baths, swim, or use a hot tub for 5 days. 4. You may shower 24-48 hours after the procedure. ? Remove the dressing and gently wash the site with plain soap and water. ? Pat the area dry with a clean towel. ? Do not rub the site. That could cause bleeding. 5. Do not apply powder or lotion to the site. Activity  1. For 24 hours after the procedure, or as directed by your health care provider: ? Do not flex or bend the affected arm. ? Do not push or pull heavy objects with the affected arm. ? Do not drive yourself home from the hospital or clinic. You may drive 24 hours after the procedure. ? Do not operate machinery or power tools. ? KEEP ARM ELEVATED THE REMAINDER OF THE DAY. 2. Do not push, pull or lift anything that is heavier than 10 lb for 5 days. 3. Ask your health care provider when it is okay to: ? Return to  work or school. ? Resume usual physical activities or sports. ? Resume sexual activity. General instructions  If the catheter site starts to bleed, raise your arm and put firm pressure on the site. If the bleeding does not stop, get help right away. This is a medical emergency.  DRINK PLENTY OF FLUIDS FOR THE NEXT 2-3 DAYS.  If you went home on the same day as your procedure, a responsible adult should be with you for the first 24 hours after you arrive home.  Keep all follow-up visits as told by your health care provider. This is important. Contact a health care provider if:  You have a fever.  You have redness, swelling, or yellow drainage around your insertion site. Get help right away if:  You have unusual pain at the radial site.  The catheter insertion area swells very fast.  The insertion area is bleeding, and the bleeding does not stop when you hold steady pressure on the area.  Your arm or hand becomes pale, cool, tingly, or numb. These symptoms may represent a serious problem that is an emergency. Do not wait to see if the symptoms will go away. Get medical help right away. Call your local emergency services (911 in the [place] Do not drive yourself to the hospital. Summary  After the procedure, it is common to have bruising and tenderness at the site.  Follow instructions from your health care provider   about how to take care of your radial site wound. Check the wound every day for signs of infection.  Do not push, pull or lift anything that is heavier than 10 lb for 5 days.  This information is not intended to replace advice given to you by your health care provider. Make sure you discuss any questions you have with your health care provider. Document Revised: 07/22/2017 Document Reviewed: 07/22/2017 Elsevier Patient Education  2020 Elsevier Inc[employer] 

## 2020-07-05 NOTE — Interval H&P Note (Signed)
History and Physical Interval Note:  07/05/2020 7:23 AM  Doristine Mango  has presented today for surgery, with the diagnosis of aortic stenosis - pre tavr.  The various methods of treatment have been discussed with the patient and family. After consideration of risks, benefits and other options for treatment, the patient has consented to  Procedure(s): RIGHT/LEFT HEART CATH AND CORONARY ANGIOGRAPHY (N/A) as a surgical intervention.  The patient's history has been reviewed, patient examined, no change in status, stable for surgery.  I have reviewed the patient's chart and labs.  Questions were answered to the patient's satisfaction.    Cath Lab Visit (complete for each Cath Lab visit)  Clinical Evaluation Leading to the Procedure:   ACS: No.  Non-ACS:    Anginal Classification: CCS II  Anti-ischemic medical therapy: Minimal Therapy (1 class of medications)  Non-Invasive Test Results: No non-invasive testing performed  Prior CABG: No previous CABG        Verne Carrow

## 2020-07-06 ENCOUNTER — Other Ambulatory Visit: Payer: Self-pay

## 2020-07-06 MED ORDER — PREDNISONE 50 MG PO TABS: ORAL_TABLET | ORAL | 0 refills | Status: DC

## 2020-07-10 ENCOUNTER — Other Ambulatory Visit: Payer: Self-pay

## 2020-07-10 ENCOUNTER — Ambulatory Visit (INDEPENDENT_AMBULATORY_CARE_PROVIDER_SITE_OTHER): Payer: Medicare HMO | Admitting: Pulmonary Disease

## 2020-07-10 ENCOUNTER — Encounter: Payer: Self-pay | Admitting: Pulmonary Disease

## 2020-07-10 VITALS — BP 124/78 | HR 62 | Ht 64.0 in | Wt 225.8 lb

## 2020-07-10 DIAGNOSIS — G4734 Idiopathic sleep related nonobstructive alveolar hypoventilation: Secondary | ICD-10-CM

## 2020-07-10 DIAGNOSIS — G4733 Obstructive sleep apnea (adult) (pediatric): Secondary | ICD-10-CM

## 2020-07-10 DIAGNOSIS — J449 Chronic obstructive pulmonary disease, unspecified: Secondary | ICD-10-CM

## 2020-07-10 MED ORDER — ANORO ELLIPTA 62.5-25 MCG/INH IN AEPB: 1.0000 | INHALATION_SPRAY | Freq: Every day | RESPIRATORY_TRACT | 3 refills | Status: DC

## 2020-07-10 NOTE — Patient Instructions (Signed)
Moderate COPD GOLD A/B - well-controlled. No changes to inhalers --CONTINUE Anoro ONE puff ONCE a day. REFILL today. --CONTINUE Albuterol as needed for shortness of breath or wheezing  Nocturnal hypoxemic respiratory failure secondary to mild OSA  Previously discussed CPAP vs oral appliance vs weight loss. She is currently enrolled in weight loss program (limits sugar and carb intake) in which she previously had success (lost 40-50lbs).  --CONTINUE working on weight loss with your program --Wear 2L oxygen via nasal cannula nightly   Follow-up in 3 months

## 2020-07-10 NOTE — Progress Notes (Signed)
Subjective:   PATIENT ID: Shannon Schultz GENDER: female DOB: 11/21/1945, MRN: [id number]   HPI  No chief complaint on file.  [name] is a 75 year old female with moderate COPD who presents for follow-up  Since our last visit, she has been compliant on Anoro and feels it is well-controlled. Denies cough and shortness of breath. Occasional wheezing. No COPD exacerbations since 2019. Denies limitation in activity. Compliant with home nocturnal oxygen. Has been trying to lose weight and successful until after the holidays when she reports she became depressed and regained weight.  Social History: 46 pack years. Quit in 2012. During 2018, she lost her sister to breast cancer in early 2018, was diagnosed with breast cancer s/p lumpectomy, had bladder cancer and underwent resection and intravesicular chemo, fell and broke her right shoulder, underwent ventral hernia repair with mesh insertion.   I have personally reviewed patient's past medical/family/social history/allergies/current medications.  Active Ambulatory Problems    Diagnosis Date Noted  . Pure hypercholesterolemia 11/13/2010  . Essential hypertension, benign 11/13/2010  . HSV (herpes simplex virus) infection 06/07/2012  . FHx: BRCA2 gene positive   . Severe obesity (BMI >= 40) ([place]) 10/06/2014  . Aortic stenosis 03/08/2015  . Thrombocytopenia ([place]) 08/17/2015  . Elevated troponin 08/17/2015  . Diabetes mellitus type 2 in obese ([place]) 08/17/2015  . Class 2 severe obesity due to excess calories with serious comorbidity and body mass index (BMI) of 39.0 to 39.9 in adult ([place]) 11/10/2015  . Atherosclerosis of coronary artery 09/14/2016  . Aortic atherosclerosis ([place]) 09/14/2016  . Atypical ductal hyperplasia of left breast 12/04/2016  . Bladder mass 12/04/2016  . Urothelial cancer ([place]) 03/10/2017  . Cirrhosis of liver without ascites ([place]) 03/11/2017  . Ventral hernia without obstruction or gangrene 05/15/2017  .  Chronic hypoxemic respiratory failure ([place]) 09/29/2017  . COPD, group B, by GOLD 2017 classification ([place]) 09/30/2017  . Ophthalmic migraine 03/16/2018  . Sinusitis 06/25/2018  . Cellulitis 09/10/2018  . Venous insufficiency 09/10/2018  . Loss of taste 05/06/2019  . Comorbid condition 05/06/2019  . Close exposure to COVID-19 virus 05/06/2019  . Person under investigation for COVID-19 05/06/2019  . Closed right hip fracture ([place]) 08/23/2019  . Hip fracture ([place]) 08/25/2019  . Upper airway cough syndrome 12/20/2019  . Chronic non-seasonal allergic rhinitis 04/27/2020  . Contrast media allergy 06/26/2020  . AAA (abdominal aortic aneurysm) without rupture ([place]) 06/26/2020  . Severe aortic stenosis    Resolved Ambulatory Problems    Diagnosis Date Noted  . Tobacco use disorder 11/13/2010  . Vitamin D deficiency 05/28/2011  . Pneumonia 06/06/2011  . Tobacco abuse 06/06/2011  . Hyponatremia 06/06/2011  . Transaminitis 06/06/2011  . Hypoxemia 06/12/2011  . Morbid obesity with body mass index of 40.0-49.9 ([place]) 06/07/2012  . Family history of malignant neoplasm of breast   . Dyspnea 03/08/2015  . Angina effort ([place]) 03/08/2015  . COPD with acute exacerbation ([place]) 08/16/2015  . COPD exacerbation ([place]) 09/29/2017   Past Medical History:  Diagnosis Date  . Arthritis   . Bladder cancer ([place]) 2018  . Breast cancer, right ([place]) 1992  . Colon polyp   . Complication of anesthesia 1992  . COPD (chronic obstructive pulmonary disease) ([place])   . COVID-19 virus infection 05/05/2019  . Diverticulosis   . Elevated cholesterol   . Elevated liver enzymes   . Environmental allergies   . Genital HSV   . Hypertension   . Hypothyroidism   .  Impaired glucose tolerance   . Migraine   . On home oxygen therapy   . Osteopenia   . Type 2 diabetes mellitus ([place])   . Ventral hernia   . Vitamin D deficiency disease      Outpatient Medications Prior to Visit  Medication Sig Dispense Refill  .  ACCU-CHEK AVIVA PLUS test strip TEST BLOOD SUGAR ONE TO TWO TIMES DAILY 200 strip 2  . Accu-Chek Softclix Lancets lancets Test BS 1-2 times daily. Pt uses accu-chek meter 100 each 1  . acetaminophen (TYLENOL) 500 MG tablet Take 1,000 mg by mouth every 6 (six) hours as needed for moderate pain or headache.    . albuterol (PROVENTIL) (2.5 MG/3ML) 0.083% nebulizer solution USE 1 VIAL BY NEBULIZATION EVERY FOUR HOURS AS NEEDED FOR WHEEZING OR SHORTNESS OF BREATH. (Patient taking differently: Take 2.5 mg by nebulization every 4 (four) hours as needed for wheezing.) 30 mL 0  . albuterol (VENTOLIN HFA) 108 (90 Base) MCG/ACT inhaler Inhale 2 puffs into the lungs every 6 (six) hours as needed for wheezing or shortness of breath. 8 g 5  . Alcohol Swabs (B-D SINGLE USE SWABS REGULAR) PADS Use twice a day when checking blood sugars 100 each 1  . aspirin EC 81 MG tablet Take 81 mg daily by mouth.    . bisoprolol-hydrochlorothiazide (ZIAC) 10-6.25 MG tablet Take 1 tablet by mouth daily. 90 tablet 1  . CALCIUM-MAGNESIUM-VITAMIN D PO Take 1 tablet by mouth in the morning and at bedtime.     . Calcium-Magnesium-Zinc (CAL-MAG-ZINC PO) Take 1 tablet by mouth daily at 12 noon.    . cetirizine (ZYRTEC) 10 MG tablet Take 10 mg by mouth daily.    . diphenhydrAMINE (BENADRYL) 50 MG tablet Take one 50 mg tablet the morning of the procedure with the last dose of the prednisone. 1 tablet 0  . fluticasone (FLONASE) 50 MCG/ACT nasal spray Place 2 sprays into both nostrils daily. (Patient taking differently: Place 2 sprays into both nostrils at bedtime.) 48 g 3  . furosemide (LASIX) 20 MG tablet TAKE 1 TABLET (20 MG TOTAL) BY MOUTH DAILY AS NEEDED FOR FLUID OR EDEMA. 30 tablet 0  . metFORMIN (GLUCOPHAGE-XR) 500 MG 24 hr tablet Take 1 tablet (500 mg total) by mouth 2 (two) times daily before a meal. 1 tablet 0  . montelukast (SINGULAIR) 10 MG tablet Take 1 tablet (10 mg total) by mouth at bedtime. 30 tablet 11  . predniSONE  (DELTASONE) 50 MG tablet Take as directed prior to 1/14 CT scans 3 tablet 0  . simvastatin (ZOCOR) 20 MG tablet Take 1 tablet (20 mg total) by mouth at bedtime. 90 tablet 1  . triamcinolone cream (KENALOG) 0.1 % Apply 1 application topically 3 (three) times a week.    . TRUEplus Lancets 30G MISC TEST BLOOD SUGAR ONE TO TWO TIMES DAILY 200 each 2  . umeclidinium-vilanterol (ANORO ELLIPTA) 62.5-25 MCG/INH AEPB Inhale 1 puff into the lungs daily. 180 each 3   No facility-administered medications prior to visit.    Review of Systems  Constitutional: Negative for chills, diaphoresis, fever, malaise/fatigue and weight loss.  HENT: Negative for congestion.   Respiratory: Positive for wheezing. Negative for cough, hemoptysis, sputum production and shortness of breath.   Cardiovascular: Negative for chest pain, palpitations and leg swelling.    Objective:   Vitals:   07/10/20 0909  BP: 124/78  Pulse: 62  SpO2: 90%  Weight: 225 lb 12.8 oz (102.4 kg)  Height: 5'  4" (1.626 m)   SpO2: 90 % O2 Device: None (Room air)  Physical Exam: General: Elderly, well-appearing, no acute distress HENT: [place], AT Eyes: EOMI, no scleral icterus Respiratory: Clear to auscultation bilaterally.  No crackles, wheezing or rales Cardiovascular: II/VI SEM, no JVD Extremities:-Edema,-tenderness Neuro: AAO x4, CNII-XII grossly intact Skin: Intact, no rashes or bruising Psych: Normal mood, normal affect  Data Reviewed:  Imaging Screening CT chest 09/10/16 -moderate emphysema Screening CT 09/16/17-moderate emphysema, scattered subcentimeter pulmonary nodules.  Fatty liver with findings of cirrhosis. Screening CT 11/29/18- interval development of RUL nodule measuring 6.1 mm CT Chest 03/03/19 - slightly decreased RUL nodule measuring 5.3 mm Screening CT chest 03/26/20 - centrilobular emphysema. Scattered tiny pulmonary nodules, stable  PFTs 08/28/11 FVC 2.71 [92%], FEV1 1.33 (62%), F/F 49, TLC 114%, DLCO 46% Moderate  obstructive defect with moderate reduction in diffusion capacity. No bronchodilator response  11/08/15 FVC 2.05 [7%), FEV1 1.23 (55%), F/F 60 Moderate obstructive defect  09/24/16 FVC 2. (82%), FEV1 1.43 (62%), F/F 58 , TLC 99%, DLCO 54% Moderate obstruction with moderate diffusion defect. No bronchodilator response.  Labs A1AT 08/08/16- 162, PIMM  Sleep study Home sleep study 05/03/19 - AHI 8.8 Nad[place] SpO2 76%  Imaging, labs and test noted above have been reviewed independently by me.    Assessment & Plan:  75 year old female with COPD and nocturnal hypoxemia secondary to mild OSA. COPD symptoms well-controlled on current bronchodilators. Previously discussed CPAP therapy for OSA however patient prefers conservative management with weight loss. In the absence of sleep symptoms, this is a reasonable approach for mild OSA as current treatment guidelines are controversial in the benefit of CPAP in mild patients. We will need to revisit this if she is unable to achieve weight loss goals.  Moderate COPD GOLD A/B - well-controlled. No changes to inhalers --CONTINUE Anoro ONE puff ONCE a day. REFILL today --CONTINUE Albuterol as needed for shortness of breath or wheezing  Nocturnal hypoxemic respiratory failure secondary to mild OSA  Previously discussed CPAP vs oral appliance vs weight loss. She is currently enrolled in weight loss program (limits sugar and carb intake) in which she previously had success (lost 40-50lbs). She has not made progress recently due to depression but is motivated to restart. --CONTINUE working on weight loss with your program --CONTINUE 2L O2 oxygen via nasal cannula nightly  --Rediscuss CPAP at next visit  Health maintenance  Immunization History  Administered Date(s) Administered  . Fluad Quad(high Dose 65+) 04/07/2019, 04/26/2020  . Influenza Split 05/28/2011  . Influenza, High Dose Seasonal PF 04/07/2013, 05/15/2014, 05/30/2015, 03/25/2016, 03/05/2017,  04/01/2018  . Influenza, Seasonal, Injecte, Preservative Fre 06/07/2012  . PFIZER SARS-COV-2 Vaccination 08/20/2019, 09/13/2019, 04/14/2020  . Pneumococcal Conjugate-13 07/06/2014  . Pneumococcal Polysaccharide-23 12/15/2004, 05/28/2011  . Tdap 02/23/2008, 06/15/2018  . Zoster 05/17/2010  . Zoster Recombinat (Shingrix) 12/02/2017, 02/06/2018   Annual CT Lung - Last CT 03/26/20. Scattered tiny pulmonary nodules, stable  No orders of the defined types were placed in this encounter.  Meds ordered this encounter  Medications  . umeclidinium-vilanterol (ANORO ELLIPTA) 62.5-25 MCG/INH AEPB    Sig: Inhale 1 puff into the lungs daily.    Dispense:  180 each    Refill:  3   Return in about 3 months (around 10/08/2020).  I have spent a total time of 31-minutes on the day of the appointment reviewing prior documentation, coordinating care and discussing medical diagnosis and plan with the patient/family. Imaging, labs and tests included in this  note have been reviewed and interpreted independently by me.  Chi Rodman Pickle, MD [facility] 07/10/2020 9:47 AM

## 2020-07-11 ENCOUNTER — Telehealth: Payer: Self-pay | Admitting: *Deleted

## 2020-07-11 DIAGNOSIS — L280 Lichen simplex chronicus: Secondary | ICD-10-CM | POA: Diagnosis not present

## 2020-07-11 DIAGNOSIS — B009 Herpesviral infection, unspecified: Secondary | ICD-10-CM

## 2020-07-11 MED ORDER — VALACYCLOVIR HCL 500 MG PO TABS: ORAL_TABLET | ORAL | 0 refills | Status: DC

## 2020-07-11 NOTE — Telephone Encounter (Signed)
Patient called and states she takes generic valtrex from time to time when she get a herpes outbreak on her buttocks. She has a bottle that expired in 2019-she will take but is asking for a new 90 day rx to be sent to [facility].

## 2020-07-11 NOTE — Telephone Encounter (Signed)
Patient advised.

## 2020-07-11 NOTE — Telephone Encounter (Signed)
The proper directions are to take it BID x 3d prn flares.  I sent #30 to [facility] (enough to treat 5 outbreaks).  The #30 she got years ago lasted long past expiration.  If she gets more than 5 flares and she needs more, let us know and we will sent refill.

## 2020-07-13 ENCOUNTER — Ambulatory Visit (HOSPITAL_COMMUNITY)
Admission: RE | Admit: 2020-07-13 | Discharge: 2020-07-13 | Disposition: A | Discharge status: Home / Self Care | Payer: Medicare HMO | Source: Ambulatory Visit | Attending: Cardiovascular Disease | Admitting: Cardiovascular Disease

## 2020-07-13 ENCOUNTER — Ambulatory Visit (HOSPITAL_COMMUNITY)
Admit: 2020-07-13 | Discharge: 2020-07-13 | Disposition: A | Discharge status: Home / Self Care | Payer: Medicare HMO | Attending: Cardiovascular Disease | Admitting: Cardiovascular Disease

## 2020-07-13 ENCOUNTER — Telehealth: Payer: Self-pay | Admitting: Pulmonary Disease

## 2020-07-13 ENCOUNTER — Other Ambulatory Visit: Payer: Self-pay

## 2020-07-13 DIAGNOSIS — I350 Nonrheumatic aortic (valve) stenosis: Secondary | ICD-10-CM

## 2020-07-13 DIAGNOSIS — I700 Atherosclerosis of aorta: Secondary | ICD-10-CM | POA: Diagnosis not present

## 2020-07-13 DIAGNOSIS — I771 Stricture of artery: Secondary | ICD-10-CM | POA: Diagnosis not present

## 2020-07-13 DIAGNOSIS — I708 Atherosclerosis of other arteries: Secondary | ICD-10-CM | POA: Diagnosis not present

## 2020-07-13 DIAGNOSIS — I2510 Atherosclerotic heart disease of native coronary artery without angina pectoris: Secondary | ICD-10-CM | POA: Diagnosis not present

## 2020-07-13 DIAGNOSIS — K5730 Diverticulosis of large intestine without perforation or abscess without bleeding: Secondary | ICD-10-CM | POA: Diagnosis not present

## 2020-07-13 DIAGNOSIS — K7460 Unspecified cirrhosis of liver: Secondary | ICD-10-CM | POA: Diagnosis not present

## 2020-07-13 MED ORDER — IOHEXOL 350 MG/ML SOLN
100.0000 mL | Freq: Once | INTRAVENOUS | Status: AC | PRN
  Administered 2020-07-13: 100 mL via INTRAVENOUS

## 2020-07-13 NOTE — CV Procedure (Signed)
Carotid duplex completed.  Results can be found under chart review under CV PROC. 07/13/2020 3:30 PM Channing Yeager RVT, RDMS

## 2020-07-13 NOTE — Telephone Encounter (Signed)
Spoke with the pt  She states anoro is no longer covered  She states will call her insurance to see what the covered alternatives are  She has 12 days remaining on anoro  Will call back with list of alternatives

## 2020-07-14 ENCOUNTER — Encounter: Payer: Self-pay | Admitting: Physician Assistant

## 2020-07-16 NOTE — Telephone Encounter (Signed)
Please complete request or prior auth for Anoro. She has moderate COPD that is currently well-controlled on this inhaler. No exacerbations in the last 2 years since starting this inhaler.  Rodman Pickle, M.D. [facility] 07/16/2020 11:00 AM

## 2020-07-16 NOTE — Telephone Encounter (Signed)
PA request was received from (pharmacy): [facility] Fax: [phone] Medication name and strength: Anoro Ordering Provider:[facility]  Was PA started with CMM?: yes If yes, please enter KEY: [id number] Medication tried and failed: symbicort, breo, trelegy, incruse Covered Alternatives: stiolto, bevespi, striverdi, spiriva  PA sent to plan, time frame for approval / denial:Your information has been submitted to [facility]. [employer] will review the request and will issue a decision, typically within 3-7 days from your submission. You can check the updated outcome later by reopening this request.  If [employer] has not responded in 3-7 days or if you have any questions about your ePA request, please contact [employer] at [phone].   Keeping mychart encounter open to follow up on. If PA is denied, will then send to Dr. Loanne Drilling to see if she wants to switch pt to one of the covered alternatives.

## 2020-07-16 NOTE — Telephone Encounter (Signed)
Received the following messages from patient:   "I spoke with a pharmacist Sharyn L[place]) at [facility] today and was advised that the covered alternative to the Anoro inhaler is Stiolto Respimat. I just looked up the components in the Stiolto respimat compared to Anoro and they don't look like the same to me.  I was also told that you can request that I stay on the Anoro and it may be approved, but at a higher cost to me.  If you do that, I will pay the difference to get the best inhaler to treat me.  Almost hate to change because I seem to be doing well."  Dr. Loanne Drilling, please advise. Thanks.

## 2020-07-19 ENCOUNTER — Other Ambulatory Visit: Payer: Self-pay

## 2020-07-19 DIAGNOSIS — I350 Nonrheumatic aortic (valve) stenosis: Secondary | ICD-10-CM

## 2020-07-19 MED ORDER — PREDNISONE 50 MG PO TABS: ORAL_TABLET | ORAL | 0 refills | Status: DC

## 2020-07-20 ENCOUNTER — Other Ambulatory Visit: Payer: Self-pay

## 2020-07-20 ENCOUNTER — Ambulatory Visit (HOSPITAL_COMMUNITY)
Admission: RE | Admit: 2020-07-20 | Discharge: 2020-07-20 | Disposition: A | Discharge status: Home / Self Care | Payer: Medicare HMO | Source: Ambulatory Visit | Attending: Cardiovascular Disease | Admitting: Cardiovascular Disease

## 2020-07-20 DIAGNOSIS — Z9049 Acquired absence of other specified parts of digestive tract: Secondary | ICD-10-CM | POA: Diagnosis not present

## 2020-07-20 DIAGNOSIS — I350 Nonrheumatic aortic (valve) stenosis: Secondary | ICD-10-CM

## 2020-07-20 DIAGNOSIS — K5730 Diverticulosis of large intestine without perforation or abscess without bleeding: Secondary | ICD-10-CM | POA: Diagnosis not present

## 2020-07-20 DIAGNOSIS — Z01818 Encounter for other preprocedural examination: Secondary | ICD-10-CM | POA: Diagnosis not present

## 2020-07-20 MED ORDER — DIPHENHYDRAMINE HCL 50 MG/ML IJ SOLN: 50.0000 mg | Freq: Once | INTRAMUSCULAR | Status: DC

## 2020-07-20 MED ORDER — DIPHENHYDRAMINE HCL 25 MG PO CAPS: 50.0000 mg | ORAL_CAPSULE | Freq: Once | ORAL | Status: DC

## 2020-07-20 MED ORDER — PREDNISONE 50 MG PO TABS: 50.0000 mg | ORAL_TABLET | Freq: Four times a day (QID) | ORAL | Status: DC

## 2020-07-20 MED ORDER — IOHEXOL 350 MG/ML SOLN
80.0000 mL | Freq: Once | INTRAVENOUS | Status: AC | PRN
  Administered 2020-07-20: 80 mL via INTRAVENOUS

## 2020-07-24 ENCOUNTER — Encounter: Payer: Medicare HMO | Admitting: Thoracic Surgery (Cardiothoracic Vascular Surgery)

## 2020-07-25 ENCOUNTER — Encounter: Payer: Self-pay | Admitting: Thoracic Surgery (Cardiothoracic Vascular Surgery)

## 2020-07-25 ENCOUNTER — Institutional Professional Consult (permissible substitution): Payer: Medicare HMO | Admitting: Thoracic Surgery (Cardiothoracic Vascular Surgery)

## 2020-07-25 ENCOUNTER — Other Ambulatory Visit: Payer: Self-pay

## 2020-07-25 VITALS — BP 160/70 | HR 86 | Temp 97.7°F | Resp 20 | Ht 64.0 in | Wt 228.0 lb

## 2020-07-25 DIAGNOSIS — I350 Nonrheumatic aortic (valve) stenosis: Secondary | ICD-10-CM | POA: Diagnosis not present

## 2020-07-25 NOTE — Patient Instructions (Signed)
Stop taking metformin 2 days prior to surgery  Continue taking all other medications without change through the day before surgery.  Make sure to bring all of your medications with you when you come for your Pre-Admission Testing appointment at [facility] Short-Stay Department.  Have nothing to eat or drink after midnight the night before surgery.  On the morning of surgery take only your Prednisone tablets with a sip of water.   At your appointment for Pre-Admission Testing at the [facility] Short-Stay Department you will be asked to sign permission forms for your upcoming surgery.  By definition your signature on these forms implies that you and/or your designee provide full informed consent for your planned surgical procedure(s), that alternative treatment options have been discussed, that you understand and accept any and all potential risks, and that you have some understanding of what to expect for your post-operative convalescence.  For any major cardiac surgical procedure potential operative risks include but are not limited to at least some risk of death, stroke or other neurologic complication, myocardial infarction, congestive heart failure, respiratory failure, renal failure, bleeding requiring blood transfusion and/or reexploration, irregular heart rhythm, heart block or bradycardia requiring permanent pacemaker, pneumonia, pericardial effusion, pleural effusion, wound infection, pulmonary embolus or other thromboembolic complication, chronic pain, or other complications related to the specific procedure(s) performed.  For transcatheter aortic valve replacement additional risks include but are not limited to risk of paravalvular leak, valve embolization, valve thrombosis, aortic dissection, aortic rupture, ventricular septal defect or perforation, pericardial tamponade, injury of the abdominal aorta or its branches, and/or injury or occlusion of the  arteries going to your arms or legs.  Please call to schedule a follow-up appointment in our office prior to surgery if you have any unresolved questions about your planned surgical procedure, the associated risks, alternative treatment options, and/or expectations for your post-operative recovery.

## 2020-07-25 NOTE — Progress Notes (Addendum)
[facility]  MULTIDISCIPLINARY HEART VALVE CLINIC  CARDIOTHORACIC SURGERY CONSULTATION REPORT  Referring Provider is Croitoru, Dani Gobble, MD Primary Cardiologist is Candee Furbish, MD PCP is [name], MD  Chief Complaint  Patient presents with  . Aortic Stenosis    Initial surgical consult, CTA chest/ abd/pel/morp 1/14, cath 1/6, echo 06/19/20    HPI:  Patient is 75 year old obese female with history of aortic stenosis, hypertension, COPD with nocturnal home oxygen therapy and obstructive sleep apnea who has been referred for surgical consultation to discuss treatment options for management of severe symptomatic aortic stenosis.    Patient reports a long history of chronic exertional shortness of breath that until recently has been largely attributed to COPD.  She quit smoking in 2012 and has been followed by a pulmonologist.  Spirometry has revealed evidence of mild to moderate obstruction that has remained stable.  She has been treated with long term inhalers and overall done fairly well.  Approximately 1 year ago she fell and broke her hip.  Her recovery has been quite slow in her mobility fairly limited.  She continues to describe stable symptoms of exertional shortness of breath that occur with moderate to low level activity.  She has not had resting shortness of breath, chest discomfort, palpitations, dizzy spells, nor syncope.  She reports occasional mild lower extremity edema.  Patient states she was first told she had a heart murmur less than 10 years ago and she has been followed for the last several years by Dr. Sallyanne Kuster with known history of aortic stenosis.  Recent follow-up echocardiogram revealed significant progression and severity of aortic stenosis with preserved left ventricular function.  The aortic valve appeared trileaflet with severe thickening, calcification, and restricted leaflet mobility.  Peak velocity across aortic valve measured 3.8 m/s corresponding to  mean transvalvular gradient estimated 38 mmHg and aortic valve area calculated only 0.70 cm by VTI.  DVI was reported 0.22 with stroke-volume index 34.  Left ventricular systolic function remain normal with ejection fraction estimated 65 to 70%.  The patient was referred to the multidisciplinary heart valve clinic.  Diagnostic cardiac catheterization performed July 05, 2020 revealed mild nonobstructive disease and confirmed the presence of aortic stenosis with peak to peak and mean transvalvular gradients measured 44 and 34.6 mmHg respectively.  Pulmonary artery pressures were moderately elevated.  CT angiography was performed and the patient was referred for surgical consultation.  Patient is married and lives with her husband locally in [place].  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  She lives a very sedentary lifestyle.  She does not exercise much at all.  She broke her hip approximately 1 year ago and has had a slow recovery.  She only recently stopped using a walker for ambulation and she still feels somewhat unsteady on her feet.  She has longstanding history of exertional shortness of breath that occurs with moderate level activity.  This does restrict her activities to some degree.  She denies history of resting shortness of breath, PND, orthopnea, palpitations, dizzy spells, or syncope.  She has some intermittent mild lower extremity edema.  She has never had any chest pain or chest tightness with exertion.  Past Medical History:  Diagnosis Date  . Arthritis    "hands" (05/15/2017)  . Bladder cancer ([place]) 2018  . Breast cancer, right ([place]) 1992   DCIS,bladder ca (just dx)  . Colon polyp   . Complication of anesthesia 1992   "local anesthesia" used  was hard to awaken from-no problems since (05/15/2017)  . COPD (chronic obstructive pulmonary disease) ([place])   . COVID-19 virus infection 05/05/2019  . Diverticulosis   . Elevated cholesterol   . Elevated liver  enzymes    fatty liver per ultrasound per pt  . FHx: BRCA2 gene positive    sister with BRCA2 mutation (pt tested NEGATIVE)  . Genital HSV    gets on hip  . HSV (herpes simplex virus) infection    on hip--on daily suppression  . Hypertension   . Hypothyroidism    took med 7 yrs after birth of 1st child  . Impaired glucose tolerance   . Migraine   . On home oxygen therapy    "have it available but I'm not using it" (05/15/2017)  . Osteopenia   . Severe aortic stenosis   . Type 2 diabetes mellitus ([place])   . Ventral hernia   . Vitamin D deficiency disease     Past Surgical History:  Procedure Laterality Date  . ABDOMINAL HERNIA REPAIR  05/15/2017  . BREAST BIOPSY Right 1992  . BREAST BIOPSY Left 2018  . BREAST EXCISIONAL BIOPSY Left 2018   ATYPICAL DUCTAL HYPERPLASIA INVOLVING A COMPLEX  . BREAST LUMPECTOMY WITH RADIOACTIVE SEED LOCALIZATION Left 12/22/2016   Procedure: LEFT BREAST LUMPECTOMY WITH RADIOACTIVE SEED LOCALIZATION;  Surgeon: Jovita Kussmaul, MD;  Location: [facility];  Service: General;  Laterality: Left;  . CATARACT EXTRACTION, BILATERAL Bilateral R 03/16/2019 L 03/30/2019   Dr. Kathlen Mody  . COLONOSCOPY  2009, 08/2010, 12/2015   Dr. Collene Mares; "only 1 had any polyps" (05/15/2017)  . CYSTOSCOPY  04/23/2017  . CYSTOSCOPY W/ RETROGRADES Bilateral 01/12/2017   Procedure: CYSTOSCOPY WITH RETROGRADE PYELOGRAM/ EXAM UNDER ANESTHESIA;  Surgeon: Raynelle Bring, MD;  Location: [facility];  Service: Urology;  Laterality: Bilateral;  . FEMUR IM NAIL Right 08/24/2019   Procedure: INTRAMEDULLARY (IM) NAIL FEMORAL;  Surgeon: Rod Can, MD;  Location: [facility];  Service: Orthopedics;  Laterality: Right;  . HERNIA REPAIR    . INSERTION OF MESH N/A 05/15/2017   Procedure: INSERTION OF MESH;  Surgeon: Jovita Kussmaul, MD;  Location: [facility];  Service: General;  Laterality: N/A;  . LAPAROSCOPIC CHOLECYSTECTOMY  2003  . MASTECTOMY Right 1992  . RIGHT/LEFT HEART CATH AND CORONARY ANGIOGRAPHY N/A 07/05/2020    Procedure: RIGHT/LEFT HEART CATH AND CORONARY ANGIOGRAPHY;  Surgeon: Burnell Blanks, MD;  Location: [facility];  Service: Cardiovascular;  Laterality: N/A;  . TRANSURETHRAL RESECTION OF BLADDER TUMOR WITH MITOMYCIN-C N/A 01/12/2017   Procedure: TRANSURETHRAL RESECTION OF BLADDER TUMOR WITH POSSIBLE POST OPERATIVE INSTILLATION OF MITOMYCIN-C;  Surgeon: Raynelle Bring, MD;  Location: [facility];  Service: Urology;  Laterality: N/A;  . TYMPANOSTOMY TUBE PLACEMENT Bilateral 1980s  . UMBILICAL HERNIA REPAIR  2003  . VENTRAL HERNIA REPAIR N/A 05/15/2017   Procedure: VENTRAL HERNIA REPAIR WITH MESH;  Surgeon: Jovita Kussmaul, MD;  Location: [facility];  Service: General;  Laterality: N/A;    Family History  Problem Relation Age of Onset  . Heart disease Father   . Diabetes Father   . Hypertension Father   . Asthma Sister   . Allergies Sister   . Hyperlipidemia Sister   . Hashimoto's thyroiditis Sister   . Fibromyalgia Sister   . Heart disease Mother        tachycardia  . Asthma Sister   . Allergies Sister   . Hyperlipidemia Sister   . Hashimoto's thyroiditis Sister   .  Fibromyalgia Sister   . Breast cancer Sister 61       BRCA2 positive; metastatic to bones at age 36, then spread to liver  . Cancer Other        female cancers, bone cancer  . Cancer Maternal Grandmother        deceased 69; unk. primary; possibly stomach  . Tuberculosis Maternal Grandfather   . Stroke Paternal Grandmother 62       died of cerebral hemorrhage    Social History   Socioeconomic History  . Marital status: Married    Spouse name: Not on file  . Number of children: 3  . Years of education: Not on file  . Highest education level: Not on file  Occupational History  . Occupation: Retired  Tobacco Use  . Smoking status: Former Smoker    Packs/day: 1.00    Years: 46.00    Pack years: 46.00    Types: Cigarettes    Quit date: 05/31/2011    Years since quitting: 9.1  . Smokeless tobacco: Never  Used  Vaping Use  . Vaping Use: Never used  Substance and Sexual Activity  . Alcohol use: No  . Drug use: No  . Sexual activity: Yes    Partners: Male    Birth control/protection: Post-menopausal  Other Topics Concern  . Not on file  Social History Narrative   Lives with her husband.  Children all live in [place] nearby. No pets. 6 grandchildren.   Social Determinants of Health   Financial Resource Strain: Not on file  Food Insecurity: Not on file  Transportation Needs: Not on file  Physical Activity: Not on file  Stress: Not on file  Social Connections: Not on file  Intimate Partner Violence: Not on file    Current Outpatient Medications  Medication Sig Dispense Refill  . ACCU-CHEK AVIVA PLUS test strip TEST BLOOD SUGAR ONE TO TWO TIMES DAILY 200 strip 2  . Accu-Chek Softclix Lancets lancets Test BS 1-2 times daily. Pt uses accu-chek meter 100 each 1  . acetaminophen (TYLENOL) 500 MG tablet Take 1,000 mg by mouth every 6 (six) hours as needed for moderate pain or headache.    . albuterol (PROVENTIL) (2.5 MG/3ML) 0.083% nebulizer solution USE 1 VIAL BY NEBULIZATION EVERY FOUR HOURS AS NEEDED FOR WHEEZING OR SHORTNESS OF BREATH. (Patient taking differently: Take 2.5 mg by nebulization every 4 (four) hours as needed for wheezing.) 30 mL 0  . albuterol (VENTOLIN HFA) 108 (90 Base) MCG/ACT inhaler Inhale 2 puffs into the lungs every 6 (six) hours as needed for wheezing or shortness of breath. 8 g 5  . Alcohol Swabs (B-D SINGLE USE SWABS REGULAR) PADS Use twice a day when checking blood sugars 100 each 1  . aspirin EC 81 MG tablet Take 81 mg daily by mouth.    . bisoprolol-hydrochlorothiazide (ZIAC) 10-6.25 MG tablet Take 1 tablet by mouth daily. 90 tablet 1  . CALCIUM-MAGNESIUM-VITAMIN D PO Take 1 tablet by mouth in the morning and at bedtime.     . Calcium-Magnesium-Zinc (CAL-MAG-ZINC PO) Take 1 tablet by mouth daily at 12 noon.    . cetirizine (ZYRTEC) 10 MG tablet Take 10 mg by mouth  daily.    . diphenhydrAMINE (BENADRYL) 50 MG tablet Take one 50 mg tablet the morning of the procedure with the last dose of the prednisone. 1 tablet 0  . fluticasone (FLONASE) 50 MCG/ACT nasal spray Place 2 sprays into both nostrils daily. (Patient taking differently: Place 2  sprays into both nostrils at bedtime.) 48 g 3  . furosemide (LASIX) 20 MG tablet TAKE 1 TABLET (20 MG TOTAL) BY MOUTH DAILY AS NEEDED FOR FLUID OR EDEMA. 30 tablet 0  . metFORMIN (GLUCOPHAGE-XR) 500 MG 24 hr tablet Take 1 tablet (500 mg total) by mouth 2 (two) times daily before a meal. 1 tablet 0  . montelukast (SINGULAIR) 10 MG tablet Take 1 tablet (10 mg total) by mouth at bedtime. 30 tablet 11  . predniSONE (DELTASONE) 50 MG tablet Take as directed prior to 1/21 CT scans 3 tablet 0  . simvastatin (ZOCOR) 20 MG tablet Take 1 tablet (20 mg total) by mouth at bedtime. 90 tablet 1  . triamcinolone cream (KENALOG) 0.1 % Apply 1 application topically 3 (three) times a week.    . TRUEplus Lancets 30G MISC TEST BLOOD SUGAR ONE TO TWO TIMES DAILY 200 each 2  . umeclidinium-vilanterol (ANORO ELLIPTA) 62.5-25 MCG/INH AEPB Inhale 1 puff into the lungs daily. 180 each 3  . valACYclovir (VALTREX) 500 MG tablet Take 1 pill by mouth twice daily for 3 days; start at earliest onset of herpes flare 30 tablet 0   No current facility-administered medications for this visit.    Allergies  Allergen Reactions  . Contrast Media [Iodinated Diagnostic Agents] Anaphylaxis, Shortness Of Breath and Other (See Comments)    Could not breath  . Iohexol Anaphylaxis, Shortness Of Breath and Other (See Comments)    Immediately could not breathe  . Lisinopril Anaphylaxis, Shortness Of Breath and Rash  . Sulfa Antibiotics Anaphylaxis and Other (See Comments)    Historical from mother, pt states that mother says she almost died from this drug  . Latex Other (See Comments)    Unless against on skin for a long time, blisters. Short term is okay.  .  Nickel Other (See Comments)    With earrings pt has soreness and drainage from piercing  . Codeine Nausea Only, Anxiety and Other (See Comments)    insomnia  . Levofloxacin Other (See Comments)    insomnia  . Lipitor [Atorvastatin Calcium] Rash  . Vicodin [Hydrocodone-Acetaminophen] Itching      Review of Systems:   General:  normal appetite, poor energy, no weight gain, no weight loss, no fever  Cardiac:  no chest pain with exertion, no chest pain at rest, +SOB with exertion, no resting SOB, no PND, no orthopnea, no palpitations, no arrhythmia, no atrial fibrillation, mild LE edema, no dizzy spells, no syncope  Respiratory:  + chronic shortness of breath, + home oxygen, no productive cough, no dry cough, no bronchitis, no wheezing, no hemoptysis, no asthma, no pain with inspiration or cough, + sleep apnea, no CPAP at night  GI:   no difficulty swallowing, no reflux, no frequent heartburn, no hiatal hernia, no abdominal pain, no constipation, no diarrhea, no hematochezia, no hematemesis, no melena  GU:   no dysuria,  no frequency, no urinary tract infection, no hematuria, no kidney stones, no kidney disease  Vascular:  no pain suggestive of claudication, no pain in feet, no leg cramps, no varicose veins, no DVT, no non-healing foot ulcer  Neuro:   no stroke, no TIA's, no seizures, no headaches, no temporary blindness one eye,  no slurred speech, no peripheral neuropathy, no chronic pain, no instability of gait, no memory/cognitive dysfunction  Musculoskeletal: + arthritis, no joint swelling, no myalgias, some difficulty walking, limited mobility   Skin:   no rash, no itching, no skin infections, no pressure  sores or ulcerations  Psych:   no anxiety, no depression, no nervousness, no unusual recent stress  Eyes:   no blurry vision, no floaters, no recent vision changes, + wears glasses or contacts  ENT:   no hearing loss, no loose or painful teeth, no dentures, last saw dentist earlier this  week  Hematologic:  no easy bruising, no abnormal bleeding, no clotting disorder, no frequent epistaxis  Endocrine:  + diabetes, does check CBG's at home           Physical Exam:   BP (!) 160/70   Pulse 86   Temp 97.7 F (36.5 C) (Skin)   Resp 20   Ht '5\' 4"'  (1.626 m)   Wt 228 lb (103.4 kg)   LMP  (LMP Unknown)   SpO2 93% Comment: RA with mask on  BMI 39.14 kg/m   General:  Obese,  well-appearing  HEENT:  Unremarkable   Neck:   no JVD, no bruits, no adenopathy   Chest:   clear to auscultation, symmetrical breath sounds, no wheezes, no rhonchi   CV:   RRR, grade III/VI crescendo/decrescendo murmur heard best at RSB,  no diastolic murmur  Abdomen:  soft, non-tender, no masses   Extremities:  warm, well-perfused, pulses diminished, no LE edema  Rectal/GU  Deferred  Neuro:   Grossly non-focal and symmetrical throughout  Skin:   Clean and dry, no rashes, no breakdown   Diagnostic Tests:   ECHOCARDIOGRAM REPORT       Patient Name:  Oveta Kayleen Memos Date of Exam: 06/19/2020  Medical Rec #: [id number]   Height:    64.0 in  Accession #:  [id number]  Weight:    222.6 lb  Date of Birth: [birth year]  BSA:     2.047 m  Patient Age:  18 years   BP:      151/77 mmHg  Patient Gender: F       HR:      83 bpm.  Exam Location: Inpatient   Procedure: 2D Echo, Cardiac Doppler and Color Doppler   Indications:  Aortic Stenosis    History:    Patient has prior history of Echocardiogram examinations,  most         recent 06/29/2019. COPD, Aortic Valve Disease,         Signs/Symptoms:Murmur; Risk Factors:Hypertension and  Diabetes.         COVID-19 virus infection (From Hx).    Sonographer:  Alvino Chapel RCS  Referring Phys: [place]    1. Left ventricular ejection fraction, by estimation, is 65 to 70%. The  left ventricle has normal function. The left ventricle has no  regional  wall motion abnormalities. There is mild left ventricular hypertrophy.  Left ventricular diastolic parameters  are consistent with Grade I diastolic dysfunction (impaired relaxation).  2. Right ventricular systolic function is normal. The right ventricular  size is normal. Tricuspid regurgitation signal is inadequate for assessing  PA pressure.  3. The mitral valve is abnormal. Trivial mitral valve regurgitation.  4. The aortic valve is tricuspid. Aortic valve regurgitation is not  visualized. Severe aortic valve stenosis. Aortic valve area, by VTI  measures 0.70 cm. Aortic valve mean gradient measures 38.0 mmHg. Aortic  valve Vmax measures 3.83 m/s. Peak gradient  59 mmHg. DI is 0.22.  5. The inferior vena cava is normal in size with greater than 50%  respiratory variability, suggesting right atrial pressure of 3 mmHg.  6. Left atrial  size was mildly dilated.   Comparison(s): Changes from prior study are noted. 06/29/19: LVEF 65-70%,  moderate to severe AS - mean gradient 29 mmHg, DI 0.26.   FINDINGS  Left Ventricle: Left ventricular ejection fraction, by estimation, is 65  to 70%. The left ventricle has normal function. The left ventricle has no  regional wall motion abnormalities. The left ventricular internal cavity  size was normal in size. There is  mild left ventricular hypertrophy. Left ventricular diastolic parameters  are consistent with Grade I diastolic dysfunction (impaired relaxation).  Indeterminate filling pressures.   Right Ventricle: The right ventricular size is normal. No increase in  right ventricular wall thickness. Right ventricular systolic function is  normal. Tricuspid regurgitation signal is inadequate for assessing PA  pressure.   Left Atrium: Left atrial size was mildly dilated.   Right Atrium: Right atrial size was normal in size.   Pericardium: There is no evidence of pericardial effusion.   Mitral Valve: The mitral valve is  abnormal. Mild to moderate mitral  annular calcification. Trivial mitral valve regurgitation.   Tricuspid Valve: The tricuspid valve is grossly normal. Tricuspid valve  regurgitation is trivial.   Aortic Valve: The aortic valve is tricuspid. Aortic valve regurgitation is  not visualized. Severe aortic stenosis is present. Aortic valve mean  gradient measures 38.0 mmHg. Aortic valve peak gradient measures 58.7  mmHg. Aortic valve area, by VTI measures  0.70 cm.   Pulmonic Valve: The pulmonic valve was normal in structure. Pulmonic valve  regurgitation is not visualized.   Aorta: The aortic root and ascending aorta are structurally normal, with  no evidence of dilitation.   Venous: The inferior vena cava is normal in size with greater than 50%  respiratory variability, suggesting right atrial pressure of 3 mmHg.   IAS/Shunts: No atrial level shunt detected by color flow Doppler.     LEFT VENTRICLE  PLAX 2D  LVIDd:     4.30 cm Diastology  LVIDs:     2.50 cm LV e' medial:  4.35 cm/s  LV PW:     1.40 cm LV E/e' medial: 16.9  LV IVS:    1.00 cm LV e' lateral:  5.00 cm/s  LVOT diam:   2.00 cm LV E/e' lateral: 14.7  LV SV:     70  LV SV Index:  34  LVOT Area:   3.14 cm     RIGHT VENTRICLE  RV S prime:   17.50 cm/s  TAPSE (M-mode): 3.4 cm   LEFT ATRIUM       Index    RIGHT ATRIUM      Index  LA diam:    4.50 cm 2.20 cm/m RA Area:   17.20 cm  LA Vol (A2C):  70.6 ml 34.48 ml/m RA Volume:  43.60 ml 21.29 ml/m  LA Vol (A4C):  57.6 ml 28.16 ml/m  LA Biplane Vol: 67.9 ml 33.16 ml/m  AORTIC VALVE  AV Area (Vmax):  0.79 cm  AV Area (Vmean):  0.69 cm  AV Area (VTI):   0.70 cm  AV Vmax:      383.00 cm/s  AV Vmean:     289.000 cm/s  AV VTI:      1.000 m  AV Peak Grad:   58.7 mmHg  AV Mean Grad:   38.0 mmHg  LVOT Vmax:     96.40 cm/s  LVOT Vmean:    63.700 cm/s  LVOT VTI:      0.223 m  LVOT/AV VTI  ratio: 0.22    AORTA  Ao Root diam: 2.70 cm   MITRAL VALVE  MV Area (PHT): 2.01 cm  SHUNTS  MV Decel Time: 377 msec  Systemic VTI: 0.22 m  MR Peak grad: 130.4 mmHg  Systemic Diam: 2.00 cm  MR Mean grad: 91.0 mmHg  MR Vmax:   571.00 cm/s  MR Vmean:   461.0 cm/s  MV E velocity: 73.30 cm/s  MV A velocity: 98.20 cm/s  MV E/A ratio: 0.75   Lyman Bishop MD  Electronically signed by Lyman Bishop MD  Signature Date/Time: 06/19/2020/2:26:48 PM    RIGHT/LEFT HEART CATH AND CORONARY ANGIOGRAPHY    Conclusion    Mid Cx lesion is 30% stenosed.  Mid LAD lesion is 20% stenosed.   Mild non-obstructive CAD Severe aortic stenosis by echo. Cath data with mean gradient of 34.6 mmHg, peak to peak gradient 44 mmHg).   Recommendations: Will continue workup for TAVR   Recommendations  Antiplatelet/Anticoag Will continue workup for TAVR   Indications  Severe aortic stenosis [I35.0 (ICD-10-CM)]   Procedural Details  Technical Details Indication: Severe aortic stenosis  Procedure: The risks, benefits, complications, treatment options, and expected outcomes were discussed with the patient. The patient and/or family concurred with the proposed plan, giving informed consent. The patient was brought to the cath lab after IV hydration was given. The patient was sedated with Versed and Fentanyl. The IV catheter present in the right antecubital vein was changed for a 5 [place] sheath. Right heart catheterization performed with a balloon tipped catheter. The right wrist was prepped and draped in a sterile fashion. 1% lidocaine was used for local anesthesia. Using the modified Seldinger access technique, a 5 French sheath was placed in the right radial artery. 3 mg Verapamil was given through the sheath. 5000 units IV heparin was given. Standard diagnostic catheters were used to perform selective coronary angiography. An AL-2 catheter was used to cross  the aortic valve with a J wire. LV pressures measured. The sheath was removed from the right radial artery and a Terumo hemostasis band was applied at the arteriotomy site on the right wrist.    Estimated blood loss <50 mL.   During this procedure medications were administered to achieve and maintain moderate conscious sedation while the patient's heart rate, blood pressure, and oxygen saturation were continuously monitored and I was present face-to-face 100% of this time.   Medications (Filter: Administrations occurring from 0720 to 0823 on 07/05/20)  midazolam (VERSED) injection (mg) Total dose:  1 mg  Date/Time Rate/Dose/Volume Action   07/05/20 0745 1 mg Given    fentaNYL (SUBLIMAZE) injection (mcg) Total dose:  25 mcg  Date/Time Rate/Dose/Volume Action   07/05/20 0746 25 mcg Given    lidocaine (PF) (XYLOCAINE) 1 % injection (mL) Total volume:  5 mL  Date/Time Rate/Dose/Volume Action   07/05/20 0757 5 mL Given    Radial Cocktail/Verapamil only (mL) Total volume:  10 mL  Date/Time Rate/Dose/Volume Action   07/05/20 0758 10 mL Given    heparin sodium (porcine) injection (Units) Total dose:  5,000 Units  Date/Time Rate/Dose/Volume Action   07/05/20 0806 5,000 Units Given    iohexol (OMNIPAQUE) 350 MG/ML injection (mL) Total volume:  60 mL  Date/Time Rate/Dose/Volume Action   07/05/20 0822 60 mL Given    Heparin (Porcine) in NaCl 1000-0.9 UT/500ML-% SOLN (mL) Total volume:  1,000 mL  Date/Time Rate/Dose/Volume Action   07/05/20 0822 500 mL Given   0822 500 mL Given  Sedation Time  Sedation Time Physician-1: 30 minutes 42 seconds   Contrast  Medication Name Total Dose  iohexol (OMNIPAQUE) 350 MG/ML injection 60 mL    Radiation/Fluoro  Fluoro time: 4.4 (min) DAP: 15.5 (Gycm2) Cumulative Air Kerma: 088 (mGy)   Complications   Complications documented before study signed (07/05/2020 8:25 AM)     RIGHT/LEFT HEART CATH AND CORONARY  ANGIOGRAPHY  None Documented by Burnell Blanks, MD 07/05/2020 8:23 AM  Date Found: 07/05/2020  Time Range: Intraprocedure       Coronary Findings   Diagnostic Dominance: Right  Left Anterior Descending  Vessel is large.  Mid LAD lesion is 20% stenosed.  Left Circumflex  Vessel is large.  Mid Cx lesion is 30% stenosed.  First Obtuse Marginal Branch  Vessel is moderate in size.  Right Coronary Artery  Vessel is large.   Intervention   No interventions have been documented.  Coronary Diagrams   Diagnostic Dominance: Right    Intervention    Implants    No implant documentation for this case.    Syng[employer] Images  Show images for CARDIAC CATHETERIZATION  Images on Long Term Storage  Show images for Tamila, [place] to Procedure Log  Procedure Log     Hemo Data  Flowsheet Row Most Recent Value  Fick Cardiac Output 12.59 L/min  Fick Cardiac Output Index 6.17 (L/min)/BSA  Aortic Mean Gradient 34.56 mmHg  Aortic Peak Gradient 44 mmHg  Aortic Valve Area 1.57  Aortic Value Area Index 0.77 cm2/BSA  RA A Wave 11 mmHg  RA V Wave 8 mmHg  RA Mean 9 mmHg  RV Systolic Pressure 47 mmHg  RV Diastolic Pressure 9 mmHg  RV EDP 11 mmHg  PA Systolic Pressure 41 mmHg  PA Diastolic Pressure 22 mmHg  PA Mean 32 mmHg  PW A Wave 26 mmHg  PW V Wave 24 mmHg  PW Mean 20 mmHg  AO Systolic Pressure 110 mmHg  AO Diastolic Pressure 76 mmHg  AO Mean 315 mmHg  LV Systolic Pressure 945 mmHg  LV Diastolic Pressure 20 mmHg  LV EDP 23 mmHg  AOp Systolic Pressure 859 mmHg  AOp Diastolic Pressure 75 mmHg  AOp Mean Pressure 292 mmHg  LVp Systolic Pressure 446 mmHg  LVp Diastolic Pressure 20 mmHg  LVp EDP Pressure 24 mmHg  QP/QS 1  TPVR Index 5.19 HRUI  TSVR Index 16.35 HRUI  PVR SVR Ratio 0.13  TPVR/TSVR Ratio 0.32     Cardiac TAVR CT  TECHNIQUE: The patient was scanned on a Siemens Force 286 slice scanner. A 120 kV retrospective scan was triggered  in the ascending thoracic aorta at 140 HU's. Gantry rotation speed was 250 msecs and collimation was .6 mm. No beta blockade or nitro were given. The 3D data set was reconstructed in 5% intervals of the R-R cycle. Systolic and diastolic phases were analyzed on a dedicated work station using MPR, MIP and VRT modes. The patient received 80 cc of contrast.  FINDINGS: Aortic Valve:  Aorta: No aneurysm normal arch vessels severe calcific atherosclerosis particularly in the aortic arch  Sino-tubular Junction: 25 mm  Ascending Thoracic Aorta: 30 mm  Aortic Arch: 22 mm  Descending Thoracic Aorta: 27 mm  Sinus of Valsalva Measurements:  Non-coronary: 29.3 mm  Right - coronary: 26.4 mm  Left -   coronary: 30 mm  Coronary Artery Height above Annulus:  Left Main: 12.5 mm above annulus  Right Coronary: 11.9 mm above annulus  Virtual  Basal Annulus Measurements:  Maximum / Minimum Diameter: 26 mm x 23.8 mm  Perimeter: 80.9 mm cc  Area: 479 mm 2  Coronary Arteries: Sufficient height above annulus for deployment  Optimum Fluoroscopic Angle for Delivery: LAO 14 Caudal 14 degrees  IMPRESSION: 1. Calcified tri leaflet AV with annular area of 479 mm2 suitable for a 26 mm Sapien 3 valve  2.  Coronary arteries sufficient height above annulus for deployment  3. Optimum angiographic angle for deployment LAO 14 Caudal 14 degrees  4.  Normal aortic root 3.0 cm with severe calcific atherosclerosis  Jenkins Rouge  Electronically Signed: By: Jenkins Rouge M.D. On: 07/13/2020 13:01    CT ANGIOGRAPHY CHEST, ABDOMEN AND PELVIS  TECHNIQUE: Non-contrast CT of the chest was initially obtained.  Multidetector CT imaging through the chest, abdomen and pelvis was performed using the standard protocol during bolus administration of intravenous contrast. Multiplanar reconstructed images and MIPs were obtained and reviewed to evaluate the vascular  anatomy.  CONTRAST:  59m OMNIPAQUE IOHEXOL 350 MG/ML SOLN  COMPARISON:  CTA of the chest, abdomen and pelvis 07/13/2020.  FINDINGS: CTA CHEST FINDINGS  Cardiovascular: Heart size is normal. There is no significant pericardial fluid, thickening or pericardial calcification. There is aortic atherosclerosis, as well as atherosclerosis of the great vessels of the mediastinum and the coronary arteries, including calcified atherosclerotic plaque in the left main, left anterior descending, left circumflex and right coronary arteries. Thickening and calcification of the aortic valve. Mild to moderate calcifications of the mitral annulus.  Mediastinum/Lymph Nodes: No pathologically enlarged mediastinal or hilar lymph nodes. Esophagus is unremarkable in appearance. No axillary lymphadenopathy. Numerous surgical clips are noted in the right axillary region.  Lungs/Pleura: No suspicious appearing pulmonary nodules or masses are noted. No acute consolidative airspace disease. No pleural effusions. Diffuse bronchial wall thickening with mild centrilobular and paraseptal emphysema.  Musculoskeletal/Soft Tissues: Status post right modified radical mastectomy and right axillary lymph node dissection. There are no aggressive appearing lytic or blastic lesions noted in the visualized portions of the skeleton.  CTA ABDOMEN AND PELVIS FINDINGS  Hepatobiliary: Liver has a shrunken appearance and nodular contour, indicative of underlying cirrhosis. No discrete cystic or solid hepatic lesions. No intra or extrahepatic biliary ductal dilatation. Status post cholecystectomy.  Pancreas: No pancreatic mass. No pancreatic ductal dilatation. No pancreatic or peripancreatic fluid collections or inflammatory changes.  Spleen: Spleen is enlarged measuring 12.8 x 7.7 x 16.9 cm (estimated splenic volume of 833 mL) .  Adrenals/Urinary Tract: Bilateral kidneys and adrenal glands are normal in  appearance. No hydroureteronephrosis. Urinary bladder is normal in appearance.  Stomach/Bowel: Normal appearance of the stomach. No pathologic dilatation of small bowel or colon. Numerous colonic diverticulae are noted, most evident in the sigmoid colon, without surrounding inflammatory changes to suggest an acute diverticulitis at this time. Normal appendix.  Vascular/Lymphatic: Aortic atherosclerosis, with vascular findings and measurements pertinent to potential TAVR procedure, as detailed below. No lymphadenopathy noted in the abdomen or pelvis.  Reproductive: Uterus and ovaries are atrophic.  Other: No significant volume of ascites.  No pneumoperitoneum.  Musculoskeletal: Status post ORIF in the proximal right femur. There are no aggressive appearing lytic or blastic lesions noted in the visualized portions of the skeleton.  VASCULAR MEASUREMENTS PERTINENT TO TAVR:  AORTA:  Minimal Aortic Diameter-14 x 13 mm  Severity of Aortic Calcification-severe  RIGHT PELVIS:  Right Common Iliac Artery -  Minimal Diameter-8.2 x 7.1 mm  Tortuosity-mild  Calcification-severe  Right External Iliac Artery -  Minimal Diameter-5.4 x 6.3 mm  Tortuosity-moderate  Calcification-moderate to severe  Right Common Femoral Artery -  Minimal Diameter-5.3 x 7.8 mm  Tortuosity-mild  Calcification-moderate  LEFT PELVIS:  Left Common Iliac Artery -  Minimal Diameter-6.7 x 5.4 mm  Tortuosity-mild  Calcification-severe  Left External Iliac Artery -  Minimal Diameter-7.3 x 5.2 mm  Tortuosity-moderate  Calcification-moderate to severe  Left Common Femoral Artery -  Minimal Diameter-5.4 x 4.0 mm  Tortuosity-mild  Calcification-moderate  Review of the MIP images confirms the above findings.  IMPRESSION: 1. Vascular findings and measurements pertinent to potential TAVR procedure, as detailed above. 2. Severe thickening  calcification of the aortic valve, compatible with reported clinical history of severe aortic stenosis. 3. Cirrhosis and splenomegaly. 4. Colonic diverticulosis without evidence of acute diverticulitis at this time. 5. Additional incidental findings, as above, similar to the prior study.   Electronically Signed   By: Vinnie Langton M.D.   On: 07/20/2020 14:22   ______________________   STS score Procedure: Isolated AVR   Risk of Mortality:3.655%  Renal Failure: 4.011%  Permanent Stroke: 0.909%  Prolonged Ventilation: 10.449%  DSW Infection:0.386%  Reoperation: 4.897%  Morbidity or Mortality: 16.623%  Short Length of Stay: 29.858%  Long Length of Stay: 9.788%      EKG: NSR w/out AV conduction delay (06/25/2021)   Impression:  Patient has stage D1 severe symptomatic aortic stenosis.  She describes stable symptoms of exertional shortness of breath and fatigue consistent with chronic diastolic congestive heart failure, New York Heart Association functional class IIb.  I personally reviewed the patient's recent transthoracic echocardiogram, diagnostic cardiac catheterization, EKG, and CT angiograms.  Echocardiogram reveals normal left ventricular systolic function with severe aortic stenosis.  The aortic valve is trileaflet with severe thickening, calcification, and restricted leaflet mobility involving all 3 leaflets.  Peak velocity across aortic valve measured 3.8 m/s corresponding to mean transvalvular gradient estimated 38 mmHg and aortic valve area calculated only 0.70 cm with DVI notably only 0.22 and stroke-volume index 34.  Left ventricular function appears normal with ejection fraction estimated 65 to 70%.  Diagnostic cardiac catheterization revealed mild nonobstructive coronary artery disease and confirmed the presence of aortic stenosis.  EKG reveals sinus rhythm without significant AV conduction delay.  Cardiac-gated CTA of the heart reveals anatomical  characteristics consistent with aortic stenosis suitable for treatment by transcatheter aortic valve replacement without any significant complicating features and CTA of the aorta and iliac vessels demonstrate what appears to be adequate pelvic vascular access to facilitate a transfemoral approach.  CT angiogram also reveals findings consistent with cirrhosis and splenomegaly.   Plan:  The patient and her husband were counseled at length regarding treatment alternatives for management of severe symptomatic aortic stenosis. Alternative approaches such as conventional aortic valve replacement, transcatheter aortic valve replacement, and continued medical therapy without intervention were compared and contrasted at length.  The risks associated with conventional surgical aortic valve replacement were discussed in detail, as were expectations for post-operative convalescence, and why I would be reluctant to consider this patient a candidate for conventional surgery.  Issues specific to transcatheter aortic valve replacement were discussed including questions about long term valve durability, the potential for paravalvular leak, possible increased risk of need for permanent pacemaker placement, and other technical complications related to the procedure itself.  Long-term prognosis with medical therapy was discussed. This discussion was placed in the context of the patient's own specific clinical presentation and past medical history.  All of their questions have been  addressed.  The patient desires to proceed with transcatheter arctic valve replacement in the near future.  We tentatively plan to proceed on August 14, 2020.  Following the decision to proceed with transcatheter aortic valve replacement, a discussion has been held regarding what types of management strategies would be attempted intraoperatively in the event of life-threatening complications, including whether or not the patient would be considered  a candidate for the use of cardiopulmonary bypass and/or conversion to open sternotomy for attempted surgical intervention.  The patient specifically requests that should a potentially life-threatening complication develop we would attempt emergency median sternotomy and/or other aggressive surgical procedures.  The patient has been advised of a variety of complications that might develop including but not limited to risks of death, stroke, paravalvular leak, aortic dissection or other major vascular complications, aortic annulus rupture, device embolization, cardiac rupture or perforation, mitral regurgitation, acute myocardial infarction, arrhythmia, heart block or bradycardia requiring permanent pacemaker placement, congestive heart failure, respiratory failure, renal failure, pneumonia, infection, other late complications related to structural valve deterioration or migration, or other complications that might ultimately cause a temporary or permanent loss of functional independence or other long term morbidity.  The patient provides full informed consent for the procedure as described and all questions were answered.      I spent in excess of 90 minutes during the conduct of this office consultation and >50% of this time involved direct face-to-face encounter with the patient for counseling and/or coordination of their care.     Valentina Gu. Roxy Manns, MD 07/25/2020 10:14 AM

## 2020-08-02 ENCOUNTER — Other Ambulatory Visit: Payer: Self-pay

## 2020-08-02 MED ORDER — PREDNISONE 50 MG PO TABS: ORAL_TABLET | ORAL | 0 refills | Status: DC

## 2020-08-03 ENCOUNTER — Other Ambulatory Visit: Payer: Self-pay

## 2020-08-03 DIAGNOSIS — I350 Nonrheumatic aortic (valve) stenosis: Secondary | ICD-10-CM

## 2020-08-07 ENCOUNTER — Other Ambulatory Visit: Payer: Self-pay

## 2020-08-07 ENCOUNTER — Telehealth: Payer: Self-pay

## 2020-08-07 DIAGNOSIS — I350 Nonrheumatic aortic (valve) stenosis: Secondary | ICD-10-CM

## 2020-08-07 NOTE — Telephone Encounter (Signed)
  [facility]   MULTIDISCIPLINARY HEART VALVE TEAM  Pt's case reviewed during Multidisciplinary Heart Valve Team meeting.  Initially the team had planned to proceed with TAVR through Right Transfemoral approach.  After further review the pt has a very tortuous/aneurysmal abd aorta.  The pt does have appropriate anatomy for Left Subclavian approach.  The team has decided to proceed with TAVR using alternative access.  I contacted the pt and made her aware of this change and answered all questions.  Pt agreed with plan.

## 2020-08-09 NOTE — Progress Notes (Signed)
Surgical Instructions    Your procedure is scheduled on Tuesday, August 14, 2020 from 7:30AM - 10:14AM.  Report to [facility] Main Entrance "A" at 5:30 A.M., then check in with the Admitting office.  Call this number if you have problems the morning of surgery:  [phone]   If you have any questions prior to your surgery date call [phone]: Open Monday-Friday 8am-4pm    Remember:  Do not eat or drink after midnight the night before your surgery  Medications:  Stop taking Metformin on 08/12/20. You will take your last dose on 08/11/20 (Saturday).  Continue taking all other medications without change through the day before surgery. On the morning of surgery do not take any medications except Prednisone as instructed.   Please follow contrast allergy instructions:  Prednisone 50 mg - take 13 hours prior to surgery (6:30 PM Monday)  Take another Prednisone 50 mg 7 hours prior to surgery (12:30 AM Tuesday)   As of today, STOP taking any Aleve, Naproxen, Ibuprofen, Motrin, Advil, Goody's, BC's, all herbal medications, fish oil, and all vitamins.   HOW TO MANAGE YOUR DIABETES BEFORE AND AFTER SURGERY  Why is it important to control my blood sugar before and after surgery? . Improving blood sugar levels before and after surgery helps healing and can limit problems. . A way of improving blood sugar control is eating a healthy diet by: o  Eating less sugar and carbohydrates o  Increasing activity/exercise o  Talking with your doctor about reaching your blood sugar goals . High blood sugars (greater than 180 mg/dL) can raise your risk of infections and slow your recovery, so you will need to focus on controlling your diabetes during the weeks before surgery. . Make sure that the doctor who takes care of your diabetes knows about your planned surgery including the date and location.  How do I manage my blood sugar before surgery? . Check your blood sugar at least 4 times a day,  starting 2 days before surgery, to make sure that the level is not too high or low. . Check your blood sugar the morning of your surgery when you wake up and every 2 hours until you get to the Short Stay unit. o If your blood sugar is less than 70 mg/dL, you will need to treat for low blood sugar: - Do not take insulin. - Treat a low blood sugar (less than 70 mg/dL) with  cup of clear juice (cranberry or apple), 4 glucose tablets, OR glucose gel. - Recheck blood sugar in 15 minutes after treatment (to make sure it is greater than 70 mg/dL). If your blood sugar is not greater than 70 mg/dL on recheck, call [phone] for further instructions. . Report your blood sugar to the short stay nurse when you get to Short Stay.  . If you are admitted to the hospital after surgery: o Your blood sugar will be checked by the staff and you will probably be given insulin after surgery (instead of oral diabetes medicines) to make sure you have good blood sugar levels. o The goal for blood sugar control after surgery is 80-180 mg/dL.                      Do not wear jewelry, make up, or nail polish            Do not wear lotions, powders, perfumes, or deodorant.            Do  not shave 48 hours prior to surgery.            Do not bring valuables to the hospital.            [facility] is not responsible for any belongings or valuables.  Do NOT Smoke (Tobacco/Vaping) or drink Alcohol 24 hours prior to your procedure If you use a CPAP at night, you may bring all equipment for your overnight stay.   Contacts, glasses, dentures or bridgework may not be worn into surgery, please bring cases for these belongings   For patients admitted to the hospital, discharge time will be determined by your treatment team.   Patients discharged the day of surgery will not be allowed to drive home, and someone needs to stay with them for 24 hours.    Special instructions:   [place]- Preparing For Surgery  Before  surgery, you can play an important role. Because skin is not sterile, your skin needs to be as free of germs as possible. You can reduce the number of germs on your skin by washing with CHG (chlorahexidine gluconate) Soap before surgery.  CHG is an antiseptic cleaner which kills germs and bonds with the skin to continue killing germs even after washing.    Oral Hygiene is also important to reduce your risk of infection.  Remember - BRUSH YOUR TEETH THE MORNING OF SURGERY WITH YOUR REGULAR TOOTHPASTE  Please do not use if you have an allergy to CHG or antibacterial soaps. If your skin becomes reddened/irritated stop using the CHG.  Do not shave (including legs and underarms) for at least 48 hours prior to first CHG shower. It is OK to shave your face.  Please follow these instructions carefully.   1. Shower the NIGHT BEFORE SURGERY and the MORNING OF SURGERY  2. If you chose to wash your hair, wash your hair first as usual with your normal shampoo.  3. After you shampoo, rinse your hair and body thoroughly to remove the shampoo.  4. Wash Face and genitals (private parts) with your normal soap.   5.  Shower the NIGHT BEFORE SURGERY and the MORNING OF SURGERY with CHG Soap.   6. Use CHG Soap as you would any other liquid soap. You can apply CHG directly to the skin and wash gently with a scrungie or a clean washcloth.   7. Apply the CHG Soap to your body ONLY FROM THE NECK DOWN.  Do not use on open wounds or open sores. Avoid contact with your eyes, ears, mouth and genitals (private parts). Wash Face and genitals (private parts)  with your normal soap.   8. Wash thoroughly, paying special attention to the area where your surgery will be performed.  9. Thoroughly rinse your body with warm water from the neck down.  10. DO NOT shower/wash with your normal soap after using and rinsing off the CHG Soap.  11. Pat yourself dry with a CLEAN TOWEL.  12. Wear CLEAN PAJAMAS to bed the night before  surgery  13. Place CLEAN SHEETS on your bed the night before your surgery  14. DO NOT SLEEP WITH PETS.   Day of Surgery: Wear Clean/Comfortable clothing the morning of surgery Do not apply any deodorants/lotions.   Remember to brush your teeth WITH YOUR REGULAR TOOTHPASTE.   Please read over the following fact sheets that you were given.

## 2020-08-10 ENCOUNTER — Encounter: Payer: Self-pay | Admitting: Physical Therapy

## 2020-08-10 ENCOUNTER — Encounter (HOSPITAL_COMMUNITY)
Admission: RE | Admit: 2020-08-10 | Discharge: 2020-08-10 | Disposition: A | Discharge status: Home / Self Care | Payer: Medicare HMO | Source: Ambulatory Visit | Attending: Cardiovascular Disease | Admitting: Cardiovascular Disease

## 2020-08-10 ENCOUNTER — Other Ambulatory Visit (HOSPITAL_COMMUNITY)
Admission: RE | Admit: 2020-08-10 | Discharge: 2020-08-10 | Disposition: A | Discharge status: Home / Self Care | Payer: Medicare HMO | Source: Ambulatory Visit | Attending: Cardiovascular Disease | Admitting: Cardiovascular Disease

## 2020-08-10 ENCOUNTER — Encounter (HOSPITAL_COMMUNITY): Payer: Self-pay

## 2020-08-10 ENCOUNTER — Ambulatory Visit: Payer: Medicare HMO | Attending: Cardiovascular Disease | Admitting: Physical Therapy

## 2020-08-10 ENCOUNTER — Other Ambulatory Visit: Payer: Self-pay

## 2020-08-10 DIAGNOSIS — Z01818 Encounter for other preprocedural examination: Secondary | ICD-10-CM | POA: Insufficient documentation

## 2020-08-10 DIAGNOSIS — Z20822 Contact with and (suspected) exposure to covid-19: Secondary | ICD-10-CM | POA: Insufficient documentation

## 2020-08-10 DIAGNOSIS — I350 Nonrheumatic aortic (valve) stenosis: Secondary | ICD-10-CM | POA: Diagnosis not present

## 2020-08-10 DIAGNOSIS — J439 Emphysema, unspecified: Secondary | ICD-10-CM | POA: Diagnosis not present

## 2020-08-10 DIAGNOSIS — R2689 Other abnormalities of gait and mobility: Secondary | ICD-10-CM | POA: Diagnosis not present

## 2020-08-10 HISTORY — DX: Atherosclerotic heart disease of native coronary artery without angina pectoris: I25.10

## 2020-08-10 HISTORY — DX: Sleep apnea, unspecified: G47.30

## 2020-08-10 LAB — CBC
HCT: 40.7 % (ref 36.0–46.0)
Hemoglobin: 13.4 g/dL (ref 12.0–15.0)
MCH: 29.8 pg (ref 26.0–34.0)
MCHC: 32.9 g/dL (ref 30.0–36.0)
MCV: 90.4 fL (ref 80.0–100.0)
Platelets: 129 10*3/uL — ABNORMAL LOW (ref 150–400)
RBC: 4.5 MIL/uL (ref 3.87–5.11)
RDW: 15.3 % (ref 11.5–15.5)
WBC: 5 10*3/uL (ref 4.0–10.5)
nRBC: 0 % (ref 0.0–0.2)

## 2020-08-10 LAB — URINALYSIS, ROUTINE W REFLEX MICROSCOPIC
Bilirubin Urine: NEGATIVE
Glucose, UA: NEGATIVE mg/dL
Hgb urine dipstick: NEGATIVE
Ketones, ur: NEGATIVE mg/dL
Leukocytes,Ua: NEGATIVE
Nitrite: NEGATIVE
Protein, ur: NEGATIVE mg/dL
Specific Gravity, Urine: 1.023 (ref 1.005–1.030)
pH: 5 (ref 5.0–8.0)

## 2020-08-10 LAB — TYPE AND SCREEN
ABO/RH(D): A POS
Antibody Screen: NEGATIVE

## 2020-08-10 LAB — BLOOD GAS, ARTERIAL
Acid-base deficit: 1.1 mmol/L (ref 0.0–2.0)
Bicarbonate: 23 mmol/L (ref 20.0–28.0)
Drawn by: 602861
FIO2: 21
O2 Saturation: 93.8 %
Patient temperature: 37
pCO2 arterial: 37.8 mmHg (ref 32.0–48.0)
pH, Arterial: 7.401 (ref 7.350–7.450)
pO2, Arterial: 71.9 mmHg — ABNORMAL LOW (ref 83.0–108.0)

## 2020-08-10 LAB — COMPREHENSIVE METABOLIC PANEL
ALT: 23 U/L (ref 0–44)
AST: 32 U/L (ref 15–41)
Albumin: 3.8 g/dL (ref 3.5–5.0)
Alkaline Phosphatase: 54 U/L (ref 38–126)
Anion gap: 13 (ref 5–15)
BUN: 20 mg/dL (ref 8–23)
CO2: 19 mmol/L — ABNORMAL LOW (ref 22–32)
Calcium: 9.4 mg/dL (ref 8.9–10.3)
Chloride: 108 mmol/L (ref 98–111)
Creatinine, Ser: 0.63 mg/dL (ref 0.44–1.00)
GFR, Estimated: >60 mL/min (ref >60)
Glucose, Bld: 200 mg/dL — ABNORMAL HIGH (ref 70–99)
Potassium: 4.1 mmol/L (ref 3.5–5.1)
Sodium: 140 mmol/L (ref 135–145)
Total Bilirubin: 0.8 mg/dL (ref 0.3–1.2)
Total Protein: 6.4 g/dL — ABNORMAL LOW (ref 6.5–8.1)

## 2020-08-10 LAB — GLUCOSE, CAPILLARY: Glucose-Capillary: 177 mg/dL — ABNORMAL HIGH (ref 70–99)

## 2020-08-10 LAB — SARS CORONAVIRUS 2 (TAT 6-24 HRS): SARS Coronavirus 2: NEGATIVE

## 2020-08-10 LAB — SURGICAL PCR SCREEN
MRSA, PCR: NEGATIVE
Staphylococcus aureus: NEGATIVE

## 2020-08-10 LAB — PROTIME-INR
INR: 1.1 (ref 0.8–1.2)
Prothrombin Time: 13.8 seconds (ref 11.4–15.2)

## 2020-08-10 NOTE — Therapy (Signed)
[place], [place], [address] Phone: [phone]   Fax:  [phone]  Physical Therapy Evaluation  Patient Details  Name: Shannon Schultz MRN: [id number] Date of Birth: [birth year] Referring Provider (PT): Lauree Chandler MD   Encounter Date: 08/10/2020   PT End of Session - 08/10/20 1150    Visit Number 1    Number of Visits 1    Date for PT Re-Evaluation 08/11/20    PT Start Time 1150    PT Stop Time 1220    PT Time Calculation (min) 30 min    Activity Tolerance Patient tolerated treatment well    Behavior During Therapy [facility] for tasks assessed/performed           Past Medical History:  Diagnosis Date  . Arthritis    "hands" (05/15/2017)  . Bladder cancer ([place]) 2018  . Breast cancer, right ([place]) 1992   DCIS,bladder ca (just dx)  . Colon polyp   . Complication of anesthesia 1992   "local anesthesia" used was hard to awaken from-no problems since (05/15/2017)  . COPD (chronic obstructive pulmonary disease) ([place])   . Coronary artery disease   . COVID-19 virus infection 05/05/2019  . Diverticulosis   . Dyspnea   . Elevated cholesterol   . Elevated liver enzymes    fatty liver per ultrasound per pt  . FHx: BRCA2 gene positive    sister with BRCA2 mutation (pt tested NEGATIVE)  . Genital HSV    gets on hip  . HSV (herpes simplex virus) infection    on hip--on daily suppression  . Hypertension   . Hypothyroidism    took med 7 yrs after birth of 1st child  . Impaired glucose tolerance   . Migraine   . On home oxygen therapy    "have it available but I'm not using it" (05/15/2017)  . Osteopenia   . Pneumonia   . Severe aortic stenosis   . Sleep apnea    Sleeps with 2 L O2 @ night  . Type 2 diabetes mellitus ([place])   . Ventral hernia   . Vitamin D deficiency disease     Past Surgical History:  Procedure Laterality Date  . ABDOMINAL HERNIA REPAIR  05/15/2017  . BREAST BIOPSY Right  1992  . BREAST BIOPSY Left 2018  . BREAST EXCISIONAL BIOPSY Left 2018   ATYPICAL DUCTAL HYPERPLASIA INVOLVING A COMPLEX  . BREAST LUMPECTOMY WITH RADIOACTIVE SEED LOCALIZATION Left 12/22/2016   Procedure: LEFT BREAST LUMPECTOMY WITH RADIOACTIVE SEED LOCALIZATION;  Surgeon: Jovita Kussmaul, MD;  Location: [place];  Service: General;  Laterality: Left;  . CARDIAC CATHETERIZATION    . CATARACT EXTRACTION, BILATERAL Bilateral R 03/16/2019 L 03/30/2019   Dr. Kathlen Mody  . COLONOSCOPY  2009, 08/2010, 12/2015   Dr. Collene Mares; "only 1 had any polyps" (05/15/2017)  . CYSTOSCOPY  04/23/2017  . CYSTOSCOPY W/ RETROGRADES Bilateral 01/12/2017   Procedure: CYSTOSCOPY WITH RETROGRADE PYELOGRAM/ EXAM UNDER ANESTHESIA;  Surgeon: Raynelle Bring, MD;  Location: [facility];  Service: Urology;  Laterality: Bilateral;  . EYE SURGERY    . FEMUR IM NAIL Right 08/24/2019   Procedure: INTRAMEDULLARY (IM) NAIL FEMORAL;  Surgeon: Rod Can, MD;  Location: [facility];  Service: Orthopedics;  Laterality: Right;  . HERNIA REPAIR    . INSERTION OF MESH N/A 05/15/2017   Procedure: INSERTION OF MESH;  Surgeon: Jovita Kussmaul, MD;  Location: [place];  Service: General;  Laterality: N/A;  . LAPAROSCOPIC CHOLECYSTECTOMY  2003  . MASTECTOMY Right 1992  . RIGHT/LEFT HEART CATH AND CORONARY ANGIOGRAPHY N/A 07/05/2020   Procedure: RIGHT/LEFT HEART CATH AND CORONARY ANGIOGRAPHY;  Surgeon: Burnell Blanks, MD;  Location: [place] [facility];  Service: Cardiovascular;  Laterality: N/A;  . TRANSURETHRAL RESECTION OF BLADDER TUMOR WITH MITOMYCIN-C N/A 01/12/2017   Procedure: TRANSURETHRAL RESECTION OF BLADDER TUMOR WITH POSSIBLE POST OPERATIVE INSTILLATION OF MITOMYCIN-C;  Surgeon: Raynelle Bring, MD;  Location: [facility];  Service: Urology;  Laterality: N/A;  . TYMPANOSTOMY TUBE PLACEMENT Bilateral 1980s  . UMBILICAL HERNIA REPAIR  2003  . VENTRAL HERNIA REPAIR N/A 05/15/2017   Procedure: VENTRAL HERNIA REPAIR WITH MESH;  Surgeon: Jovita Kussmaul, MD;   Location: [place];  Service: General;  Laterality: N/A;    There were no vitals filed for this visit.    Subjective Assessment - 08/10/20 1155    Subjective pt is a 75 y.o  F with hx of hip surgery 1 year ago with slow recovery due to sedentary.  hx of aortic valve stenosis, hypertension, severe obesity and COPD. CC SOB for several years but reports she was thinking it was due to her hip and COPD. pt reports no other issues today.    Patient Stated Goals to get heart better    Currently in Pain? No/denies              [facility] PT Assessment - 08/10/20 0001      Assessment   Medical Diagnosis severe Aortic Stenosis    Referring Provider (PT) Lauree Chandler MD    Hand Dominance Right      Precautions   Precautions None      Restrictions   Weight Bearing Restrictions No      Balance Screen   Has the patient fallen in the past 6 months No      [place] residence    Living Arrangements Spouse/significant other    Available Help at Discharge Family    Type of [place] to enter    Entrance Stairs-Number of Steps 1    [place] One level    Furniture conservator/restorer - 2 wheels;Wheelchair - manual      ROM / Strength   AROM / PROM / Strength AROM;Strength      AROM   Overall AROM  Within functional limits for tasks performed    Overall AROM Comments limited RUE flexion and ER compared bil      Strength   Overall Strength Within functional limits for tasks performed    Right Hand Grip (lbs) 44    Left Hand Grip (lbs) 47      Ambulation/Gait   Gait Pattern Decreased stride length;Step-through pattern;Decreased stance time - right;Decreased step length - left    Gait Comments pt ambulated 263 ft in 2:00 before requiring 2:30 second seated break HR 112 O2 87%. she was able walk an additional 185 ft finishing the test            [facility] Pre-Surgical Assessment - 08/10/20  0001    5 Meter Walk Test- trial 1 4 sec    5 Meter Walk Test- trial 2 4 sec.     5 Meter Walk Test- trial 3 4 sec.    5 meter walk test average 4 sec    4 Stage Balance Test tolerated for:  10 sec.    4  Stage Balance Test Position 4    Sit To Stand Test- trial 1 30 sec.    ADL/IADL Independent with: Bathing;Dressing;Meal prep;Finances    ADL/IADL Needs Assistance with: [place] work    ADL/IADL Fraility Index Vulnerable    6 Minute Walk- Baseline yes    BP (mmHg) 117/66    HR (bpm) 72    02 Sat (%RA) 96 %    Modified Borg Scale for Dyspnea 1- Very mild shortness of breath    Perceived Rate of Exertion (Borg) 7- Very, very light    6 Minute Walk Post Test yes    BP (mmHg) 163/85    HR (bpm) 112    02 Sat (%RA) 87 %    Modified Borg Scale for Dyspnea 7- Severe shortness of breath or very hard breathing    Perceived Rate of Exertion (Borg) 17- Very hard    Aerobic Endurance Distance Walked 448    Endurance additional comments pt is 71% limited compared to age related norm                    Objective measurements completed on examination: See above findings.                            Plan - 08/10/20 1228    Clinical Impression Statement see assessment in note    Stability/Clinical Decision Making Stable/Uncomplicated    Clinical Decision Making Low    PT Frequency One time visit    PT Next Visit Plan Pre TAVR evaluation    Consulted and Agree with Plan of Care Patient           Clinical Impression Statement: Pt is a 75 yo F presenting to OP PT for evaluation prior to possible TAVR surgery due to severe aortic stenosis. Pt reports onset of SOB  Several years ago with worsening in the last year. Symptoms are limiting endurnace. Pt presents with good ROM except for R shoulder flexion/ abduction and good strength, good balance and is assessed as low at high fall risk 4 stage balance test, good walking speed and limited aerobic endurance per 6 minute  walk test.pt ambulated 263 ft in 2:00 before requiring 2:30 second seated break HR 112 O2 87%. she was able walk an additional 185 ft finishing the test on room air. Pt reported 7/10 shortness of breath on modified scale for dyspnea.Pt ambulated a total of 448 feet in 6 minute walk. SOB increased significantly with 6 minute walk test. Based on the Short Physical Performance Battery, patient has a frailty rating of 9/12 with </= 5/12 considered frail.    Patient demonstrated the following deficits and impairments:     Visit Diagnosis: Other abnormalities of gait and mobility     Problem List Patient Active Problem List   Diagnosis Date Noted  . Severe aortic stenosis   . Contrast media allergy 06/26/2020  . AAA (abdominal aortic aneurysm) without rupture ([place]) 06/26/2020  . Chronic non-seasonal allergic rhinitis 04/27/2020  . Upper airway cough syndrome 12/20/2019  . Hip fracture ([place]) 08/25/2019  . Closed right hip fracture ([place]) 08/23/2019  . Loss of taste 05/06/2019  . Comorbid condition 05/06/2019  . Close exposure to COVID-19 virus 05/06/2019  . Person under investigation for COVID-19 05/06/2019  . Cellulitis 09/10/2018  . Venous insufficiency 09/10/2018  . Sinusitis 06/25/2018  . Ophthalmic migraine 03/16/2018  . COPD, group B, by GOLD 2017 classification ([place])  09/30/2017  . Chronic hypoxemic respiratory failure ([place]) 09/29/2017  . Ventral hernia without obstruction or gangrene 05/15/2017  . Cirrhosis of liver without ascites ([place]) 03/11/2017  . Urothelial cancer ([place]) 03/10/2017  . Atypical ductal hyperplasia of left breast 12/04/2016  . Bladder mass 12/04/2016  . Atherosclerosis of coronary artery 09/14/2016  . Aortic atherosclerosis ([place]) 09/14/2016  . Class 2 severe obesity due to excess calories with serious comorbidity and body mass index (BMI) of 39.0 to 39.9 in adult ([place]) 11/10/2015  . Thrombocytopenia ([place]) 08/17/2015  . Elevated troponin 08/17/2015  . Diabetes  mellitus type 2 in obese ([place]) 08/17/2015  . Aortic stenosis 03/08/2015  . Severe obesity (BMI >= 40) ([place]) 10/06/2014  . FHx: BRCA2 gene positive   . HSV (herpes simplex virus) infection 06/07/2012  . Pure hypercholesterolemia 11/13/2010  . Essential hypertension, benign 11/13/2010   Starr Lake PT, DPT, LAT, ATC  08/10/20  12:31 PM      [facility] [address] [place], [place], [address] Phone: [phone]   Fax:  [phone]  Name: Shannon Schultz MRN: [id number] Date of Birth: April 10, 1946

## 2020-08-10 NOTE — Progress Notes (Signed)
PCP - Dr. [name] Cardiologist - Dr. Dani Gobble Croitoru Pulmonologist: Dr. Rayann Heman  PPM/ICD - Denies  Chest x-ray - 08/10/20 EKG - 08/10/20 Stress Test - 03/21/15 ECHO - 06/19/20 Cardiac Cath - 07/05/20  Sleep Study - Yes, Pt uses 2L O2 @ night.  Fasting Blood Sugar - 120-130's Checks Blood Sugar __1___ time a day  Blood Thinner Instructions: N/A Aspirin Instructions: Continue taking until the day before surgery; LD 08/13/20  ERAS Protcol - No  COVID TEST- 08/10/20   Anesthesia review: Yes cardiac hx  Patient denies shortness of breath, fever, cough and chest pain at PAT appointment   All instructions explained to the patient, with a verbal understanding of the material. Patient agrees to go over the instructions while at home for a better understanding. Patient also instructed to self quarantine after being tested for COVID-19. The opportunity to ask questions was provided.

## 2020-08-13 MED ORDER — NOREPINEPHRINE 4 MG/250ML-% IV SOLN
0.0000 ug/min | INTRAVENOUS | Status: AC
  Administered 2020-08-14: 1 ug/min via INTRAVENOUS
  Filled 2020-08-13: qty 250

## 2020-08-13 MED ORDER — SODIUM CHLORIDE 0.9 % IV SOLN
INTRAVENOUS | Status: DC
  Filled 2020-08-13: qty 30

## 2020-08-13 MED ORDER — VANCOMYCIN HCL 1500 MG/300ML IV SOLN
1500.0000 mg | INTRAVENOUS | Status: AC
  Administered 2020-08-14: 1500 mg via INTRAVENOUS
  Filled 2020-08-13: qty 300

## 2020-08-13 MED ORDER — SODIUM CHLORIDE 0.9 % IV SOLN
1.5000 g | INTRAVENOUS | Status: AC
  Administered 2020-08-14: 1.5 g via INTRAVENOUS
  Filled 2020-08-13: qty 1.5

## 2020-08-13 MED ORDER — MAGNESIUM SULFATE 50 % IJ SOLN
40.0000 meq | INTRAMUSCULAR | Status: DC
  Filled 2020-08-13: qty 9.85

## 2020-08-13 MED ORDER — POTASSIUM CHLORIDE 2 MEQ/ML IV SOLN
80.0000 meq | INTRAVENOUS | Status: DC
  Filled 2020-08-13: qty 40

## 2020-08-13 MED ORDER — DEXMEDETOMIDINE HCL IN NACL 400 MCG/100ML IV SOLN
0.1000 ug/kg/h | INTRAVENOUS | Status: DC
  Filled 2020-08-13: qty 100

## 2020-08-14 ENCOUNTER — Inpatient Hospital Stay (HOSPITAL_COMMUNITY): Payer: Medicare HMO

## 2020-08-14 ENCOUNTER — Encounter (HOSPITAL_COMMUNITY)
Admission: RE | Disposition: A | Discharge status: Home Health | Payer: Self-pay | Source: Home / Self Care | Attending: Thoracic Surgery (Cardiothoracic Vascular Surgery)

## 2020-08-14 ENCOUNTER — Encounter: Payer: Self-pay | Admitting: Physician Assistant

## 2020-08-14 ENCOUNTER — Inpatient Hospital Stay (HOSPITAL_COMMUNITY)
Admission: RE | Admit: 2020-08-14 | Discharge: 2020-08-15 | DRG: 267 | Disposition: A | Discharge status: Home Health | Payer: Medicare HMO | Attending: Thoracic Surgery (Cardiothoracic Vascular Surgery) | Admitting: Thoracic Surgery (Cardiothoracic Vascular Surgery)

## 2020-08-14 ENCOUNTER — Other Ambulatory Visit: Payer: Self-pay

## 2020-08-14 ENCOUNTER — Inpatient Hospital Stay (HOSPITAL_COMMUNITY): Payer: Medicare HMO | Admitting: Physician Assistant

## 2020-08-14 DIAGNOSIS — E7800 Pure hypercholesterolemia, unspecified: Secondary | ICD-10-CM | POA: Diagnosis not present

## 2020-08-14 DIAGNOSIS — Z9049 Acquired absence of other specified parts of digestive tract: Secondary | ICD-10-CM | POA: Diagnosis not present

## 2020-08-14 DIAGNOSIS — Z952 Presence of prosthetic heart valve: Secondary | ICD-10-CM

## 2020-08-14 DIAGNOSIS — Z006 Encounter for examination for normal comparison and control in clinical research program: Secondary | ICD-10-CM | POA: Diagnosis not present

## 2020-08-14 DIAGNOSIS — Z8249 Family history of ischemic heart disease and other diseases of the circulatory system: Secondary | ICD-10-CM | POA: Diagnosis not present

## 2020-08-14 DIAGNOSIS — Z87891 Personal history of nicotine dependence: Secondary | ICD-10-CM

## 2020-08-14 DIAGNOSIS — J449 Chronic obstructive pulmonary disease, unspecified: Secondary | ICD-10-CM | POA: Diagnosis not present

## 2020-08-14 DIAGNOSIS — K760 Fatty (change of) liver, not elsewhere classified: Secondary | ICD-10-CM | POA: Diagnosis present

## 2020-08-14 DIAGNOSIS — E785 Hyperlipidemia, unspecified: Secondary | ICD-10-CM | POA: Diagnosis not present

## 2020-08-14 DIAGNOSIS — I350 Nonrheumatic aortic (valve) stenosis: Secondary | ICD-10-CM | POA: Diagnosis not present

## 2020-08-14 DIAGNOSIS — Z8616 Personal history of COVID-19: Secondary | ICD-10-CM | POA: Diagnosis not present

## 2020-08-14 DIAGNOSIS — Z8551 Personal history of malignant neoplasm of bladder: Secondary | ICD-10-CM | POA: Diagnosis not present

## 2020-08-14 DIAGNOSIS — E1169 Type 2 diabetes mellitus with other specified complication: Secondary | ICD-10-CM | POA: Diagnosis present

## 2020-08-14 DIAGNOSIS — Z833 Family history of diabetes mellitus: Secondary | ICD-10-CM

## 2020-08-14 DIAGNOSIS — I083 Combined rheumatic disorders of mitral, aortic and tricuspid valves: Secondary | ICD-10-CM | POA: Diagnosis not present

## 2020-08-14 DIAGNOSIS — E039 Hypothyroidism, unspecified: Secondary | ICD-10-CM | POA: Diagnosis not present

## 2020-08-14 DIAGNOSIS — J9611 Chronic respiratory failure with hypoxia: Secondary | ICD-10-CM | POA: Diagnosis present

## 2020-08-14 DIAGNOSIS — J9811 Atelectasis: Secondary | ICD-10-CM | POA: Diagnosis not present

## 2020-08-14 DIAGNOSIS — Z803 Family history of malignant neoplasm of breast: Secondary | ICD-10-CM

## 2020-08-14 DIAGNOSIS — D696 Thrombocytopenia, unspecified: Secondary | ICD-10-CM | POA: Diagnosis not present

## 2020-08-14 DIAGNOSIS — I2510 Atherosclerotic heart disease of native coronary artery without angina pectoris: Secondary | ICD-10-CM | POA: Diagnosis present

## 2020-08-14 DIAGNOSIS — K7460 Unspecified cirrhosis of liver: Secondary | ICD-10-CM | POA: Diagnosis present

## 2020-08-14 DIAGNOSIS — Z7982 Long term (current) use of aspirin: Secondary | ICD-10-CM | POA: Diagnosis not present

## 2020-08-14 DIAGNOSIS — I10 Essential (primary) hypertension: Secondary | ICD-10-CM | POA: Diagnosis present

## 2020-08-14 DIAGNOSIS — Z7984 Long term (current) use of oral hypoglycemic drugs: Secondary | ICD-10-CM

## 2020-08-14 DIAGNOSIS — Z853 Personal history of malignant neoplasm of breast: Secondary | ICD-10-CM | POA: Diagnosis not present

## 2020-08-14 DIAGNOSIS — C689 Malignant neoplasm of urinary organ, unspecified: Secondary | ICD-10-CM | POA: Diagnosis present

## 2020-08-14 DIAGNOSIS — Z9011 Acquired absence of right breast and nipple: Secondary | ICD-10-CM

## 2020-08-14 DIAGNOSIS — E119 Type 2 diabetes mellitus without complications: Secondary | ICD-10-CM | POA: Diagnosis present

## 2020-08-14 DIAGNOSIS — Z6839 Body mass index (BMI) 39.0-39.9, adult: Secondary | ICD-10-CM

## 2020-08-14 DIAGNOSIS — B009 Herpesviral infection, unspecified: Secondary | ICD-10-CM | POA: Diagnosis present

## 2020-08-14 DIAGNOSIS — G4733 Obstructive sleep apnea (adult) (pediatric): Secondary | ICD-10-CM | POA: Diagnosis present

## 2020-08-14 DIAGNOSIS — Z79899 Other long term (current) drug therapy: Secondary | ICD-10-CM

## 2020-08-14 DIAGNOSIS — E669 Obesity, unspecified: Secondary | ICD-10-CM | POA: Diagnosis present

## 2020-08-14 DIAGNOSIS — Z9981 Dependence on supplemental oxygen: Secondary | ICD-10-CM | POA: Diagnosis not present

## 2020-08-14 HISTORY — DX: Presence of prosthetic heart valve: Z95.2

## 2020-08-14 HISTORY — PX: TEE WITHOUT CARDIOVERSION: SHX5443

## 2020-08-14 HISTORY — DX: Hyperlipidemia, unspecified: E78.5

## 2020-08-14 LAB — POCT I-STAT 7, (LYTES, BLD GAS, ICA,H+H)
Acid-Base Excess: 0 mmol/L (ref 0.0–2.0)
Bicarbonate: 26.9 mmol/L (ref 20.0–28.0)
Calcium, Ion: 1.25 mmol/L (ref 1.15–1.40)
HCT: 39 % (ref 36.0–46.0)
Hemoglobin: 13.3 g/dL (ref 12.0–15.0)
O2 Saturation: 100 %
Potassium: 3.9 mmol/L (ref 3.5–5.1)
Sodium: 141 mmol/L (ref 135–145)
TCO2: 28 mmol/L (ref 22–32)
pCO2 arterial: 52 mmHg — ABNORMAL HIGH (ref 32.0–48.0)
pH, Arterial: 7.321 — ABNORMAL LOW (ref 7.350–7.450)
pO2, Arterial: 365 mmHg — ABNORMAL HIGH (ref 83.0–108.0)

## 2020-08-14 LAB — POCT I-STAT, CHEM 8
BUN: 15 mg/dL (ref 8–23)
BUN: 15 mg/dL (ref 8–23)
BUN: 15 mg/dL (ref 8–23)
BUN: 16 mg/dL (ref 8–23)
Calcium, Ion: 1.3 mmol/L (ref 1.15–1.40)
Calcium, Ion: 1.31 mmol/L (ref 1.15–1.40)
Calcium, Ion: 1.34 mmol/L (ref 1.15–1.40)
Calcium, Ion: 1.33 mmol/L (ref 1.15–1.40)
Chloride: 105 mmol/L (ref 98–111)
Chloride: 105 mmol/L (ref 98–111)
Chloride: 105 mmol/L (ref 98–111)
Chloride: 107 mmol/L (ref 98–111)
Creatinine, Ser: 0.4 mg/dL — ABNORMAL LOW (ref 0.44–1.00)
Creatinine, Ser: 0.4 mg/dL — ABNORMAL LOW (ref 0.44–1.00)
Creatinine, Ser: 0.5 mg/dL (ref 0.44–1.00)
Creatinine, Ser: 0.4 mg/dL — ABNORMAL LOW (ref 0.44–1.00)
Glucose, Bld: 224 mg/dL — ABNORMAL HIGH (ref 70–99)
Glucose, Bld: 201 mg/dL — ABNORMAL HIGH (ref 70–99)
Glucose, Bld: 184 mg/dL — ABNORMAL HIGH (ref 70–99)
Glucose, Bld: 157 mg/dL — ABNORMAL HIGH (ref 70–99)
HCT: 40 % (ref 36.0–46.0)
HCT: 35 % — ABNORMAL LOW (ref 36.0–46.0)
HCT: 36 % (ref 36.0–46.0)
HCT: 37 % (ref 36.0–46.0)
Hemoglobin: 13.6 g/dL (ref 12.0–15.0)
Hemoglobin: 11.9 g/dL — ABNORMAL LOW (ref 12.0–15.0)
Hemoglobin: 12.2 g/dL (ref 12.0–15.0)
Hemoglobin: 12.6 g/dL (ref 12.0–15.0)
Potassium: 3.9 mmol/L (ref 3.5–5.1)
Potassium: 3.7 mmol/L (ref 3.5–5.1)
Potassium: 3.8 mmol/L (ref 3.5–5.1)
Potassium: 3.9 mmol/L (ref 3.5–5.1)
Sodium: 141 mmol/L (ref 135–145)
Sodium: 141 mmol/L (ref 135–145)
Sodium: 141 mmol/L (ref 135–145)
Sodium: 141 mmol/L (ref 135–145)
TCO2: 27 mmol/L (ref 22–32)
TCO2: 23 mmol/L (ref 22–32)
TCO2: 25 mmol/L (ref 22–32)
TCO2: 25 mmol/L (ref 22–32)

## 2020-08-14 LAB — ECHO TEE
AR max vel: 3.09 cm2
AV Area VTI: 3.84 cm2
AV Area mean vel: 2.66 cm2
AV Mean grad: 5 mmHg
AV Peak grad: 8.5 mmHg
Ao pk vel: 1.46 m/s

## 2020-08-14 LAB — GLUCOSE, CAPILLARY
Glucose-Capillary: 252 mg/dL — ABNORMAL HIGH (ref 70–99)
Glucose-Capillary: 216 mg/dL — ABNORMAL HIGH (ref 70–99)
Glucose-Capillary: 185 mg/dL — ABNORMAL HIGH (ref 70–99)
Glucose-Capillary: 217 mg/dL — ABNORMAL HIGH (ref 70–99)

## 2020-08-14 SURGERY — IMPLANTATION, AORTIC VALVE, TRANSCATHETER, SUBCLAVIAN ARTERY APPROACH
Anesthesia: General | Site: Chest

## 2020-08-14 MED ORDER — SODIUM CHLORIDE 0.9 % IV SOLN: INTRAVENOUS | Status: AC

## 2020-08-14 MED ORDER — INSULIN ASPART 100 UNIT/ML ~~LOC~~ SOLN: 6.0000 [IU] | SUBCUTANEOUS | Status: AC

## 2020-08-14 MED ORDER — CHLORHEXIDINE GLUCONATE 0.12 % MT SOLN
15.0000 mL | Freq: Once | OROMUCOSAL | Status: AC
  Administered 2020-08-14: 15 mL via OROMUCOSAL
  Filled 2020-08-14: qty 15

## 2020-08-14 MED ORDER — PREDNISONE 20 MG PO TABS
50.0000 mg | ORAL_TABLET | Freq: Once | ORAL | Status: AC
  Administered 2020-08-14: 50 mg via ORAL
  Filled 2020-08-14: qty 3

## 2020-08-14 MED ORDER — BUPIVACAINE HCL (PF) 0.5 % IJ SOLN
INTRAMUSCULAR | Status: AC
  Filled 2020-08-14: qty 30

## 2020-08-14 MED ORDER — LIDOCAINE 2% (20 MG/ML) 5 ML SYRINGE
INTRAMUSCULAR | Status: DC | PRN
  Administered 2020-08-14: 50 mg via INTRAVENOUS

## 2020-08-14 MED ORDER — UMECLIDINIUM-VILANTEROL 62.5-25 MCG/INH IN AEPB
1.0000 | INHALATION_SPRAY | Freq: Every day | RESPIRATORY_TRACT | Status: DC
  Administered 2020-08-14 – 2020-08-15 (×2): 1 via RESPIRATORY_TRACT
  Filled 2020-08-14: qty 14

## 2020-08-14 MED ORDER — VANCOMYCIN HCL IN DEXTROSE 1-5 GM/200ML-% IV SOLN
1000.0000 mg | Freq: Once | INTRAVENOUS | Status: AC
  Administered 2020-08-14: 1000 mg via INTRAVENOUS
  Filled 2020-08-14: qty 200

## 2020-08-14 MED ORDER — MORPHINE SULFATE (PF) 2 MG/ML IV SOLN: 1.0000 mg | INTRAVENOUS | Status: DC | PRN

## 2020-08-14 MED ORDER — ONDANSETRON HCL 4 MG/2ML IJ SOLN: 4.0000 mg | Freq: Four times a day (QID) | INTRAMUSCULAR | Status: DC | PRN

## 2020-08-14 MED ORDER — HEPARIN SODIUM (PORCINE) 1000 UNIT/ML IJ SOLN
INTRAMUSCULAR | Status: DC | PRN
  Administered 2020-08-14: 16000 [IU] via INTRAVENOUS

## 2020-08-14 MED ORDER — LORATADINE 10 MG PO TABS
10.0000 mg | ORAL_TABLET | Freq: Every day | ORAL | Status: DC
  Administered 2020-08-14 – 2020-08-15 (×2): 10 mg via ORAL
  Filled 2020-08-14 (×2): qty 1

## 2020-08-14 MED ORDER — LACTATED RINGERS IV SOLN: INTRAVENOUS | Status: DC | PRN

## 2020-08-14 MED ORDER — FLUTICASONE PROPIONATE 50 MCG/ACT NA SUSP
2.0000 | Freq: Every day | NASAL | Status: DC
  Administered 2020-08-14: 2 via NASAL
  Filled 2020-08-14: qty 16

## 2020-08-14 MED ORDER — MIDAZOLAM HCL 5 MG/5ML IJ SOLN
INTRAMUSCULAR | Status: DC | PRN
  Administered 2020-08-14: 1 mg via INTRAVENOUS

## 2020-08-14 MED ORDER — LACTATED RINGERS IV SOLN
INTRAVENOUS | Status: DC | PRN
Start: 2020-08-14 — End: 2020-08-14

## 2020-08-14 MED ORDER — SIMVASTATIN 20 MG PO TABS
20.0000 mg | ORAL_TABLET | Freq: Every day | ORAL | Status: DC
  Administered 2020-08-14: 20 mg via ORAL
  Filled 2020-08-14: qty 1

## 2020-08-14 MED ORDER — IODIXANOL 320 MG/ML IV SOLN
INTRAVENOUS | Status: DC | PRN
Start: 2020-08-14 — End: 2020-08-14
  Administered 2020-08-14: 42 mL via INTRA_ARTERIAL

## 2020-08-14 MED ORDER — DIPHENHYDRAMINE HCL 25 MG PO CAPS
50.0000 mg | ORAL_CAPSULE | Freq: Once | ORAL | Status: AC
  Administered 2020-08-14: 50 mg via ORAL
  Filled 2020-08-14: qty 2

## 2020-08-14 MED ORDER — ACETAMINOPHEN 650 MG RE SUPP: 650.0000 mg | Freq: Four times a day (QID) | RECTAL | Status: DC | PRN

## 2020-08-14 MED ORDER — PROTAMINE SULFATE 10 MG/ML IV SOLN
INTRAVENOUS | Status: DC | PRN
  Administered 2020-08-14: 150 mg via INTRAVENOUS

## 2020-08-14 MED ORDER — GUAIFENESIN ER 600 MG PO TB12
600.0000 mg | ORAL_TABLET | Freq: Two times a day (BID) | ORAL | Status: DC
  Administered 2020-08-14 – 2020-08-15 (×3): 600 mg via ORAL
  Filled 2020-08-14 (×3): qty 1

## 2020-08-14 MED ORDER — CHLORHEXIDINE GLUCONATE 4 % EX LIQD: 30.0000 mL | CUTANEOUS | Status: DC

## 2020-08-14 MED ORDER — ETOMIDATE 2 MG/ML IV SOLN
INTRAVENOUS | Status: AC
  Filled 2020-08-14: qty 20

## 2020-08-14 MED ORDER — SODIUM CHLORIDE 0.9 % IV SOLN: INTRAVENOUS | Status: DC

## 2020-08-14 MED ORDER — HEPARIN SODIUM (PORCINE) 1000 UNIT/ML IJ SOLN
INTRAMUSCULAR | Status: AC
  Filled 2020-08-14: qty 1

## 2020-08-14 MED ORDER — TRAMADOL HCL 50 MG PO TABS: 50.0000 mg | ORAL_TABLET | ORAL | Status: DC | PRN

## 2020-08-14 MED ORDER — SODIUM CHLORIDE 0.9% FLUSH
3.0000 mL | Freq: Two times a day (BID) | INTRAVENOUS | Status: DC
  Administered 2020-08-15: 3 mL via INTRAVENOUS

## 2020-08-14 MED ORDER — OXYCODONE HCL 5 MG PO TABS: 5.0000 mg | ORAL_TABLET | ORAL | Status: DC | PRN

## 2020-08-14 MED ORDER — PHENYLEPHRINE HCL-NACL 10-0.9 MG/250ML-% IV SOLN
INTRAVENOUS | Status: AC
  Filled 2020-08-14: qty 250

## 2020-08-14 MED ORDER — ONDANSETRON HCL 4 MG/2ML IJ SOLN
INTRAMUSCULAR | Status: DC | PRN
  Administered 2020-08-14: 4 mg via INTRAVENOUS

## 2020-08-14 MED ORDER — PHENYLEPHRINE HCL-NACL 20-0.9 MG/250ML-% IV SOLN: 0.0000 ug/min | INTRAVENOUS | Status: DC

## 2020-08-14 MED ORDER — WHITE PETROLATUM EX OINT
TOPICAL_OINTMENT | CUTANEOUS | Status: AC
  Administered 2020-08-14: 0.2
  Filled 2020-08-14: qty 28.35

## 2020-08-14 MED ORDER — SODIUM CHLORIDE 0.9 % IV SOLN: 250.0000 mL | INTRAVENOUS | Status: DC | PRN

## 2020-08-14 MED ORDER — CHLORHEXIDINE GLUCONATE 4 % EX LIQD: 60.0000 mL | Freq: Once | CUTANEOUS | Status: DC

## 2020-08-14 MED ORDER — CLOPIDOGREL BISULFATE 75 MG PO TABS
75.0000 mg | ORAL_TABLET | Freq: Every day | ORAL | Status: DC
  Administered 2020-08-15: 75 mg via ORAL
  Filled 2020-08-14: qty 1

## 2020-08-14 MED ORDER — ASPIRIN EC 81 MG PO TBEC
81.0000 mg | DELAYED_RELEASE_TABLET | Freq: Every day | ORAL | Status: DC
  Administered 2020-08-14 – 2020-08-15 (×2): 81 mg via ORAL
  Filled 2020-08-14 (×2): qty 1

## 2020-08-14 MED ORDER — PROTAMINE SULFATE 10 MG/ML IV SOLN
INTRAVENOUS | Status: AC
  Filled 2020-08-14: qty 25

## 2020-08-14 MED ORDER — INSULIN ASPART 100 UNIT/ML ~~LOC~~ SOLN
SUBCUTANEOUS | Status: AC
  Administered 2020-08-14: 6 [IU] via INTRAVENOUS
  Filled 2020-08-14: qty 1

## 2020-08-14 MED ORDER — PROPOFOL 10 MG/ML IV BOLUS
INTRAVENOUS | Status: AC
  Filled 2020-08-14: qty 20

## 2020-08-14 MED ORDER — SODIUM CHLORIDE 0.9% FLUSH: 3.0000 mL | INTRAVENOUS | Status: DC | PRN

## 2020-08-14 MED ORDER — BUPIVACAINE HCL (PF) 0.5 % IJ SOLN
INTRAMUSCULAR | Status: DC | PRN
  Administered 2020-08-14: 15 mL

## 2020-08-14 MED ORDER — FENTANYL CITRATE (PF) 100 MCG/2ML IJ SOLN
INTRAMUSCULAR | Status: DC | PRN
  Administered 2020-08-14: 100 ug via INTRAVENOUS
  Administered 2020-08-14 (×3): 50 ug via INTRAVENOUS

## 2020-08-14 MED ORDER — SODIUM CHLORIDE 0.9 % IV SOLN
INTRAVENOUS | Status: AC
  Filled 2020-08-14 (×3): qty 1.2

## 2020-08-14 MED ORDER — LABETALOL HCL 5 MG/ML IV SOLN
INTRAVENOUS | Status: DC | PRN
  Administered 2020-08-14 (×2): 10 mg via INTRAVENOUS

## 2020-08-14 MED ORDER — FENTANYL CITRATE (PF) 250 MCG/5ML IJ SOLN
INTRAMUSCULAR | Status: AC
  Filled 2020-08-14: qty 5

## 2020-08-14 MED ORDER — ESMOLOL HCL 100 MG/10ML IV SOLN
INTRAVENOUS | Status: DC | PRN
  Administered 2020-08-14 (×2): 20 mg via INTRAVENOUS

## 2020-08-14 MED ORDER — ROCURONIUM BROMIDE 10 MG/ML (PF) SYRINGE
PREFILLED_SYRINGE | INTRAVENOUS | Status: DC | PRN
  Administered 2020-08-14 (×2): 50 mg via INTRAVENOUS

## 2020-08-14 MED ORDER — SODIUM CHLORIDE 0.9 % IV SOLN
1.5000 g | Freq: Two times a day (BID) | INTRAVENOUS | Status: DC
  Administered 2020-08-14 – 2020-08-15 (×2): 1.5 g via INTRAVENOUS
  Filled 2020-08-14 (×3): qty 1.5

## 2020-08-14 MED ORDER — SODIUM CHLORIDE 0.9 % IV SOLN
INTRAVENOUS | Status: DC | PRN
  Administered 2020-08-14: 1500 mL

## 2020-08-14 MED ORDER — MONTELUKAST SODIUM 10 MG PO TABS
10.0000 mg | ORAL_TABLET | Freq: Every day | ORAL | Status: DC
  Administered 2020-08-14: 10 mg via ORAL
  Filled 2020-08-14: qty 1

## 2020-08-14 MED ORDER — MIDAZOLAM HCL 2 MG/2ML IJ SOLN
INTRAMUSCULAR | Status: AC
  Filled 2020-08-14: qty 2

## 2020-08-14 MED ORDER — ETOMIDATE 2 MG/ML IV SOLN
INTRAVENOUS | Status: DC | PRN
  Administered 2020-08-14: 20 mg via INTRAVENOUS

## 2020-08-14 MED ORDER — INSULIN REGULAR(HUMAN) IN NACL 100-0.9 UT/100ML-% IV SOLN
INTRAVENOUS | Status: DC | PRN
  Administered 2020-08-14: 13 [IU]/h via INTRAVENOUS

## 2020-08-14 MED ORDER — NITROGLYCERIN IN D5W 200-5 MCG/ML-% IV SOLN: 0.0000 ug/min | INTRAVENOUS | Status: DC

## 2020-08-14 MED ORDER — SUGAMMADEX SODIUM 200 MG/2ML IV SOLN
INTRAVENOUS | Status: DC | PRN
  Administered 2020-08-14: 200 mg via INTRAVENOUS

## 2020-08-14 MED ORDER — INSULIN ASPART 100 UNIT/ML ~~LOC~~ SOLN
0.0000 [IU] | Freq: Three times a day (TID) | SUBCUTANEOUS | Status: DC
  Administered 2020-08-14: 4 [IU] via SUBCUTANEOUS
  Administered 2020-08-14: 8 [IU] via SUBCUTANEOUS

## 2020-08-14 MED ORDER — PROPOFOL 10 MG/ML IV BOLUS
INTRAVENOUS | Status: DC | PRN
Start: 2020-08-14 — End: 2020-08-14
  Administered 2020-08-14: 70 mg via INTRAVENOUS
  Administered 2020-08-14: 100 mg via INTRAVENOUS

## 2020-08-14 MED ORDER — ACETAMINOPHEN 325 MG PO TABS
650.0000 mg | ORAL_TABLET | Freq: Four times a day (QID) | ORAL | Status: DC | PRN
  Administered 2020-08-14 – 2020-08-15 (×4): 650 mg via ORAL
  Filled 2020-08-14 (×4): qty 2

## 2020-08-14 SURGICAL SUPPLY — 75 items
ADH SKN CLS APL DERMABOND .7 (GAUZE/BANDAGES/DRESSINGS) ×2
ADH SKN CLS LQ APL DERMABOND (GAUZE/BANDAGES/DRESSINGS) ×2
BAG DECANTER FOR FLEXI CONT (MISCELLANEOUS) ×1 IMPLANT
BAG SNAP BAND KOVER 36X36 (MISCELLANEOUS) ×3 IMPLANT
CABLE ADAPT CONN TEMP 6FT (ADAPTER) ×3 IMPLANT
CATH BEACON 5 .035 65 KMP TIP (CATHETERS) ×1 IMPLANT
CATH DIAG EXPO 6F AL1 (CATHETERS) ×1 IMPLANT
CATH DIAG EXPO 6F VENT PIG 145 (CATHETERS) ×6 IMPLANT
CATH S G BIP PACING (CATHETERS) ×3 IMPLANT
CLIP VESOCCLUDE MED 24/CT (CLIP) ×1 IMPLANT
CLIP VESOCCLUDE SM WIDE 24/CT (CLIP) ×1 IMPLANT
CLOSURE MYNX CONTROL 6F/7F (Vascular Products) ×1 IMPLANT
CNTNR URN SCR LID CUP LEK RST (MISCELLANEOUS) ×4 IMPLANT
CONT SPEC 4OZ STRL OR WHT (MISCELLANEOUS) ×6
COVER BACK TABLE 80X110 HD (DRAPES) ×6 IMPLANT
COVER DOME SNAP 22 D (MISCELLANEOUS) ×1 IMPLANT
DERMABOND ADHESIVE PROPEN (GAUZE/BANDAGES/DRESSINGS) ×1
DERMABOND ADVANCED (GAUZE/BANDAGES/DRESSINGS) ×1
DERMABOND ADVANCED .7 DNX12 (GAUZE/BANDAGES/DRESSINGS) ×2 IMPLANT
DERMABOND ADVANCED .7 DNX6 (GAUZE/BANDAGES/DRESSINGS) IMPLANT
DRAPE INCISE IOBAN 66X45 STRL (DRAPES) ×1 IMPLANT
DRSG TEGADERM 4X4.75 (GAUZE/BANDAGES/DRESSINGS) ×5 IMPLANT
ELECT REM PT RETURN 9FT ADLT (ELECTROSURGICAL) ×6
ELECTRODE REM PT RTRN 9FT ADLT (ELECTROSURGICAL) ×4 IMPLANT
FELT TEFLON 6X6 (MISCELLANEOUS) ×1 IMPLANT
GAUZE SPONGE 4X4 12PLY STRL (GAUZE/BANDAGES/DRESSINGS) ×3 IMPLANT
GLOVE BIO SURGEON STRL SZ7.5 (GLOVE) ×1 IMPLANT
GOWN STRL REUS W/ TWL LRG LVL3 (GOWN DISPOSABLE) IMPLANT
GOWN STRL REUS W/ TWL XL LVL3 (GOWN DISPOSABLE) ×2 IMPLANT
GOWN STRL REUS W/TWL LRG LVL3 (GOWN DISPOSABLE) ×12
GOWN STRL REUS W/TWL XL LVL3 (GOWN DISPOSABLE) ×6
GUIDEWIRE SAFE TJ AMPLATZ EXST (WIRE) ×3 IMPLANT
KIT BASIN OR (CUSTOM PROCEDURE TRAY) ×3 IMPLANT
KIT HEART LEFT (KITS) ×3 IMPLANT
KIT SUCTION CATH 14FR (SUCTIONS) ×1 IMPLANT
KIT TURNOVER KIT B (KITS) ×3 IMPLANT
LOOP VESSEL MAXI BLUE (MISCELLANEOUS) ×2 IMPLANT
NDL HYPO 25GX1X1/2 BEV (NEEDLE) IMPLANT
NDL PERC 18GX7CM (NEEDLE) ×2 IMPLANT
NEEDLE 22X1 1/2 (OR ONLY) (NEEDLE) ×1 IMPLANT
NEEDLE HYPO 25GX1X1/2 BEV (NEEDLE) ×3 IMPLANT
NEEDLE PERC 18GX7CM (NEEDLE) ×3 IMPLANT
NS IRRIG 1000ML POUR BTL (IV SOLUTION) ×9 IMPLANT
PACK ENDOVASCULAR (PACKS) ×3 IMPLANT
PAD ARMBOARD 7.5X6 YLW CONV (MISCELLANEOUS) ×6 IMPLANT
PAD ELECT DEFIB RADIOL ZOLL (MISCELLANEOUS) ×3 IMPLANT
PENCIL BUTTON HOLSTER BLD 10FT (ELECTRODE) ×3 IMPLANT
POSITIONER HEAD DONUT 9IN (MISCELLANEOUS) ×3 IMPLANT
SHEATH BRITE TIP 7FR 35CM (SHEATH) ×3 IMPLANT
SHEATH PINNACLE 6F 10CM (SHEATH) ×3 IMPLANT
SHEATH PINNACLE 8F 10CM (SHEATH) ×3 IMPLANT
SLEEVE REPOSITIONING LENGTH 30 (MISCELLANEOUS) ×3 IMPLANT
SPONGE GAUZE 2X2 8PLY STRL LF (GAUZE/BANDAGES/DRESSINGS) ×1 IMPLANT
STOPCOCK MORSE 400PSI 3WAY (MISCELLANEOUS) ×6 IMPLANT
SUT ETHIBOND X763 2 0 SH 1 (SUTURE) IMPLANT
SUT GORETEX CV 4 TH 22 36 (SUTURE) ×1 IMPLANT
SUT GORETEX CV4 TH-18 (SUTURE) ×2 IMPLANT
SUT MNCRL AB 3-0 PS2 18 (SUTURE) ×1 IMPLANT
SUT PROLENE 5 0 C 1 36 (SUTURE) ×1 IMPLANT
SUT SILK  1 MH (SUTURE) ×3
SUT SILK 1 MH (SUTURE) ×2 IMPLANT
SUT SILK 3 0 TIES 10X30 (SUTURE) ×1 IMPLANT
SUT VIC AB 2-0 CT1 27 (SUTURE) ×3
SUT VIC AB 2-0 CT1 36 (SUTURE) ×1 IMPLANT
SUT VIC AB 2-0 CT1 TAPERPNT 27 (SUTURE) IMPLANT
SUT VIC AB 3-0 SH 8-18 (SUTURE) ×2 IMPLANT
SYR 50ML LL SCALE MARK (SYRINGE) ×3 IMPLANT
SYR BULB IRRIG 60ML STRL (SYRINGE) ×1 IMPLANT
SYR CONTROL 10ML LL (SYRINGE) ×1 IMPLANT
TOWEL GREEN STERILE (TOWEL DISPOSABLE) ×3 IMPLANT
TOWEL GREEN STERILE FF (TOWEL DISPOSABLE) ×3 IMPLANT
TRANSDUCER W/STOPCOCK (MISCELLANEOUS) ×6 IMPLANT
VALVE 26 ULTRA SAPIEN KIT (Valve) ×1 IMPLANT
WIRE EMERALD 3MM-J .035X150CM (WIRE) ×3 IMPLANT
WIRE EMERALD 3MM-J .035X260CM (WIRE) ×3 IMPLANT

## 2020-08-14 NOTE — Anesthesia Procedure Notes (Signed)
Procedure Name: Intubation Date/Time: 08/14/2020 7:42 AM Performed by: Lance Coon, CRNA Pre-anesthesia Checklist: Patient identified, Emergency Drugs available, Suction available, Patient being monitored and Timeout performed Patient Re-evaluated:Patient Re-evaluated prior to induction Oxygen Delivery Method: Circle system utilized Preoxygenation: Pre-oxygenation with 100% oxygen Induction Type: IV induction Ventilation: Mask ventilation without difficulty and Oral airway inserted - appropriate to patient size Laryngoscope Size: Miller and 3 Grade View: Grade II Tube type: Oral Tube size: 7.0 mm Number of attempts: 1 Airway Equipment and Method: Stylet Placement Confirmation: positive ETCO2,  breath sounds checked- equal and bilateral and ETT inserted through vocal cords under direct vision Secured at: 21 cm Tube secured with: Tape Dental Injury: Teeth and Oropharynx as per pre-operative assessment

## 2020-08-14 NOTE — Anesthesia Postprocedure Evaluation (Signed)
Anesthesia Post Note  Patient: Shannon Schultz  Procedure(s) Performed: TRANSCATHETER AORTIC VALVE REPLACEMENT, LEFT SUBCLAVIAN (Left Chest) TRANSESOPHAGEAL ECHOCARDIOGRAM (TEE) (N/A )     Patient location during evaluation: Cath Lab Anesthesia Type: General Level of consciousness: awake and alert Pain management: pain level controlled Vital Signs Assessment: post-procedure vital signs reviewed and stable Respiratory status: spontaneous breathing, nonlabored ventilation, respiratory function stable and patient connected to nasal cannula oxygen Cardiovascular status: blood pressure returned to baseline and stable Postop Assessment: no apparent nausea or vomiting Anesthetic complications: no   No complications documented.  Last Vitals:  Vitals:   08/14/20 1246 08/14/20 1603  BP: 124/68 117/63  Pulse: 88 100  Resp: 15 18  Temp:  36.5 C  SpO2: 99% 96%    Last Pain:  Vitals:   08/14/20 1603  TempSrc: Oral  PainSc:                  [place]

## 2020-08-14 NOTE — Anesthesia Procedure Notes (Signed)
Arterial Line Insertion Start/End2/15/2022 7:00 AM, 08/14/2020 7:15 AM Performed by: Lance Coon, CRNA, CRNA  Preanesthetic checklist: patient identified, IV checked, site marked, risks and benefits discussed, surgical consent, monitors and equipment checked, pre-op evaluation, timeout performed and anesthesia consent Lidocaine 1% used for infiltration Right, radial was placed Catheter size: 20 G Hand hygiene performed  and maximum sterile barriers used   Attempts: 1 Procedure performed without using ultrasound guided technique. Following insertion, dressing applied and Biopatch. Post procedure assessment: normal and unchanged  Patient tolerated the procedure well with no immediate complications.

## 2020-08-14 NOTE — Progress Notes (Signed)
R radial, 20 g., line was removed, and manual pressure was held for 10 min. There is a small echymossis at the insertion site. R radial pulse was +2, and capilary refilll < 3 sec.

## 2020-08-14 NOTE — Progress Notes (Signed)
Hyperglycemic in Pre-Op:  CBG: 252  Treatment: 6 units Novolog IV (verbal order received from Dr. Marcie Bal; given @ [phone]  Follow-up CBG: Time: 07:03 CBG Result: 216  Possible Reasons for Event: Medication regimen: Prednisone  Comments/MD notified: Dr. Albertha Ghee   Feliz Beam, RN

## 2020-08-14 NOTE — Transfer of Care (Signed)
Immediate Anesthesia Transfer of Care Note  Patient: Shannon Schultz  Procedure(s) Performed: TRANSCATHETER AORTIC VALVE REPLACEMENT, LEFT SUBCLAVIAN (Left Chest) TRANSESOPHAGEAL ECHOCARDIOGRAM (TEE) (N/A )  Patient Location: Cath Lab  Anesthesia Type:General  Level of Consciousness: drowsy and patient cooperative  Airway & Oxygen Therapy: Patient Spontanous Breathing and Patient connected to face mask oxygen  Post-op Assessment: Report given to RN and Post -op Vital signs reviewed and stable  Post vital signs: Reviewed and stable  Last Vitals:  Vitals Value Taken Time  BP 130/62 08/14/20 1042  Temp 36.4 C 08/14/20 1040  Pulse 89 08/14/20 1043  Resp 13 08/14/20 1043  SpO2 99 % 08/14/20 1043  Vitals shown include unvalidated device data.  Last Pain:  Vitals:   08/14/20 1040  TempSrc: Temporal  PainSc:       Patients Stated Pain Goal: 2 (58/25/18 9842)  Complications: No complications documented.

## 2020-08-14 NOTE — Op Note (Signed)
[facility]   MULTIDISCIPLINARY HEART VALVE TEAM   TAVR OPERATIVE NOTE   Date of Procedure:  08/14/2020  Preoperative Diagnosis: Severe Aortic Stenosis   Postoperative Diagnosis: Same   Procedure:    Transcatheter Aortic Valve Replacement - Left Trans-Subclavian Approach  Edwards Sapien 3 UltraTHV (size 26 mm, model # 9750TFX, serial # [id number])   Co-Surgeons:  Valentina Gu. Roxy Manns, MD and Lauree Chandler, MD  Anesthesiologist:  Albertha Ghee, MD  Echocardiographer:  Sanda Klein, MD  Pre-operative Echo Findings:  Severe aortic stenosis  Normal left ventricular systolic function  Post-operative Echo Findings:  No paravalvular leak  Normal left ventricular systolic function   BRIEF CLINICAL NOTE AND INDICATIONS FOR SURGERY  Patient is 75 year old obese female with history of aortic stenosis, hypertension, COPD with nocturnal home oxygen therapy and obstructive sleep apnea who has been referred for surgical consultation to discuss treatment options for management of severe symptomatic aortic stenosis.    Patient reports a long history of chronic exertional shortness of breath that until recently has been largely attributed to COPD.  She quit smoking in 2012 and has been followed by a pulmonologist.  Spirometry has revealed evidence of mild to moderate obstruction that has remained stable.  She has been treated with long term inhalers and overall done fairly well.  Approximately 1 year ago she fell and broke her hip.  Her recovery has been quite slow in her mobility fairly limited.  She continues to describe stable symptoms of exertional shortness of breath that occur with moderate to low level activity.  She has not had resting shortness of breath, chest discomfort, palpitations, dizzy spells, nor syncope.  She reports occasional mild lower extremity edema.  Patient states she was first told she had a heart murmur less than 10 years ago and she has been  followed for the last several years by Dr. Sallyanne Kuster with known history of aortic stenosis.  Recent follow-up echocardiogram revealed significant progression and severity of aortic stenosis with preserved left ventricular function.  The aortic valve appeared trileaflet with severe thickening, calcification, and restricted leaflet mobility.  Peak velocity across aortic valve measured 3.8 m/s corresponding to mean transvalvular gradient estimated 38 mmHg and aortic valve area calculated only 0.70 cm by VTI.  DVI was reported 0.22 with stroke-volume index 34.  Left ventricular systolic function remain normal with ejection fraction estimated 65 to 70%.  The patient was referred to the multidisciplinary heart valve clinic.  Diagnostic cardiac catheterization performed July 05, 2020 revealed mild nonobstructive disease and confirmed the presence of aortic stenosis with peak to peak and mean transvalvular gradients measured 44 and 34.6 mmHg respectively.  Pulmonary artery pressures were moderately elevated.  CT angiography was performed and the patient was referred for surgical consultation  During the course of the patient's preoperative work up they have been evaluated comprehensively by a multidisciplinary team of specialists coordinated through the [facility] in the [facility].  They have been demonstrated to suffer from symptomatic severe aortic stenosis as noted above. The patient has been counseled extensively as to the relative risks and benefits of all options for the treatment of severe aortic stenosis including long term medical therapy, conventional surgery for aortic valve replacement, and transcatheter aortic valve replacement.  All questions have been answered, and the patient provides full informed consent for the operation as described.   DETAILS OF THE OPERATIVE PROCEDURE  PREPARATION:    The patient is brought to  the operating room on the  above mentioned date and appropriate monitoring was established by the anesthesia team. The patient is placed in the supine position on the operating table.  Intravenous antibiotics are administered.  General endotracheal anesthesia is induced uneventfully.   Baseline transesophageal echocardiogram was performed. The patient's chest, abdomen, both groins, and both lower extremities are prepared and draped in a sterile manner. A time out procedure is performed.   PERIPHERAL ACCESS:    Using the modified Seldinger technique, femoral arterial and venous access was obtained with placement of 6 Fr sheaths on the right side.  A pigtail diagnostic catheter was passed through the right arterial sheath under fluoroscopic guidance into the aortic root.  A temporary transvenous pacemaker catheter was passed through the right femoral venous sheath under fluoroscopic guidance into the right ventricle.  The pacemaker was tested to ensure stable lead placement and pacemaker capture. Aortic root angiography was performed in order to determine the optimal angiographic angle for valve deployment.   TRANS-SUBCLAVIAN ACCESS:   A small oblique incision is made in the left deltopectoral groove.  The incision is completed through the subcutaneous tissues with electrocautery.  The pectoralis muscle fibers are split longitudinally and the deep pectoralis fascia incised.  Sharp dissection is utilized to dissect beneath the pectoralis fascia.  The insertion of the pectoralis minor muscle was retracted laterally.  The left axillary artery is identified and encircled with an elastic tape.  A pair of Abbott Perclose percutaneous closure devices were placed and a 6 French sheath replaced into the axillary artery.  The patient was heparinized systemically and ACT verified > 250 seconds.  The left axillary artery is cannulated using a Seldinger technique and a guidewire advanced into the aortic root using fluoroscopy.  An 8 French  sheath is passed over the guidewire into the axillary artery.  An AL-1 catheter was used to direct a straight-tip exchange length wire across the native aortic valve into the left ventricle. This was exchanged out for a pigtail catheter and position was confirmed in the LV apex. The pigtail catheter was exchanged for an Amplatz Extra-stiff wire in the LV apex.  A 14 Fr transfemoral E-sheath was introduced into the left axillary artery after progressively dilating over the Amplatz Extra-stiff wire. Echocardiography was utilized to confirm appropriate wire position and no sign of entanglement in the mitral subvalvular apparatus.   TRANSCATHETER HEART VALVE DEPLOYMENT:   An Edwards Sapien 3 Ultra transcatheter heart valve (size 26 mm, model #9750TFX, serial #[id number]) was prepared and crimped per manufacturer's guidelines, and the proper orientation of the valve is confirmed on the [employer] delivery system. The valve was advanced through the introducer sheath using normal technique until in an appropriate position in the aorta beyond the sheath tip. The balloon was then retracted and using the fine-tuning wheel was centered on the valve. The valve was then advanced across the aortic valve annulus. The Commander catheter was retracted using normal technique. Once final position of the valve has been confirmed by angiographic assessment, the valve is deployed while temporarily holding ventilation and during rapid ventricular pacing to maintain systolic blood pressure < 50 mmHg and pulse pressure < 10 mmHg. The balloon inflation is held for >3 seconds after reaching full deployment volume. Once the balloon has fully deflated the balloon is retracted into the ascending aorta and valve function is assessed using echocardiography. There is felt to be no paravalvular leak and no central aortic insufficiency.  The patient's hemodynamic recovery  following valve deployment is good.  The deployment balloon and  guidewire are both removed.    PROCEDURE COMPLETION:   The sheath was removed and axillary artery closure performed.  Protamine was administered once arterial repair was complete.  The small incision in the left deltopectoral groove was closed in multiple layers and the skin closed with subcuticular skin closure.  A total of 15 mL of 0.5% bupivacaine was utilized to anesthetize the surgical incision.  The temporary pacemaker, pigtail catheters and femoral sheaths were removed with manual pressure used for hemostasis.  A Mynx femoral closure device was utilized following removal of the diagnostic sheath in the right femoral artery.  The patient tolerated the procedure well and is transported to the surgical intensive care in stable condition. There were no immediate intraoperative complications. All sponge instrument and needle counts are verified correct at completion of the operation.   The patient received a total of 42 mL of intravenous contrast during the procedure.   Rexene Alberts, MD 08/14/2020 10:10 AM

## 2020-08-14 NOTE — Progress Notes (Signed)
Echocardiogram Echocardiogram Transesophageal has been performed as part of TAVR procedure.  Shannon Schultz 08/14/2020, 9:58 AM

## 2020-08-14 NOTE — Interval H&P Note (Signed)
History and Physical Interval Note:  08/14/2020 7:58 AM  Shannon Schultz  has presented today for surgery, with the diagnosis of Severe Aortic Stenosis.  The various methods of treatment have been discussed with the patient and family. After consideration of risks, benefits and other options for treatment, the patient has consented to  Procedure(s): TRANSCATHETER AORTIC VALVE REPLACEMENT, LEFT SUBCLAVIAN (Left) TRANSESOPHAGEAL ECHOCARDIOGRAM (TEE) (N/A) as a surgical intervention.  The patient's history has been reviewed, patient examined, no change in status, stable for surgery.  I have reviewed the patient's chart and labs.  Questions were answered to the patient's satisfaction.     Lauree Chandler

## 2020-08-14 NOTE — Progress Notes (Signed)
Mobility Specialist - Progress Note   08/14/20 1610  Mobility  Activity Ambulated in hall  Level of Assistance Minimal assist, patient does 75% or more  Assistive Device Four wheel walker  Distance Ambulated (ft) 470 ft  Mobility Response Tolerated well  Mobility performed by Mobility specialist  $Mobility charge 1 Mobility   Pre-mobility, 2L O2: 98 HR, 132/53 BP, 96% SpO2 During mobility, 2L O2: 122 HR, 88-92% SpO2 Post-mobility, 2L O2: 108 HR, 117/63 BP, 95% SpO2  RN assessed groin sites prior to ambulation. Pt was min assist to elevate trunk when sitting up on edge of bed due to R shoulder pain. She did not require any assistance to stand. Pt then ambulated w/ standby assist on 2L O2 w/ rollator. States PTA she ambulated household distance w/o an AD and community distances w/ a Radiation protection practitioner. Min assist to lift legs when getting back in bed.   Pricilla Handler Mobility Specialist Mobility Specialist Phone: [phone]

## 2020-08-14 NOTE — Anesthesia Preprocedure Evaluation (Signed)
Anesthesia Evaluation  Patient identified by MRN, date of birth, ID band Patient awake    Reviewed: Allergy & Precautions, H&P , NPO status , Patient's Chart, lab work & pertinent test results  Airway Mallampati: II   Neck ROM: full    Dental   Pulmonary sleep apnea , COPD, former smoker,    breath sounds clear to auscultation       Cardiovascular hypertension, + Valvular Problems/Murmurs AS  Rhythm:regular Rate:Normal     Neuro/Psych  Headaches,    GI/Hepatic   Endo/Other  diabetes, Type 2Hypothyroidism   Renal/GU      Musculoskeletal  (+) Arthritis ,   Abdominal   Peds  Hematology   Anesthesia Other Findings   Reproductive/Obstetrics                             Anesthesia Physical Anesthesia Plan  ASA: III  Anesthesia Plan: General   Post-op Pain Management:    Induction: Intravenous  PONV Risk Score and Plan: 3 and Ondansetron, Dexamethasone and Treatment may vary due to age or medical condition  Airway Management Planned: Oral ETT  Additional Equipment: Arterial line and TEE  Intra-op Plan:   Post-operative Plan: Extubation in OR  Informed Consent: I have reviewed the patients History and Physical, chart, labs and discussed the procedure including the risks, benefits and alternatives for the proposed anesthesia with the patient or authorized representative who has indicated his/her understanding and acceptance.     Dental advisory given  Plan Discussed with: CRNA, Anesthesiologist and Surgeon  Anesthesia Plan Comments:         Anesthesia Quick Evaluation

## 2020-08-14 NOTE — Discharge Instructions (Signed)
ACTIVITY AND EXERCISE °• Daily activity and exercise are an important part of your recovery. People recover at different rates depending on their general health and type of valve procedure. °• Most people recovering from TAVR feel better relatively quickly  °• No lifting, pushing, pulling more than 10 pounds (examples to avoid: groceries, vacuuming, gardening, golfing): °            - For one week with a procedure through the groin. °            - For six weeks for procedures through the chest wall or neck. °NOTE: You will typically see one of our providers 7-14 days after your procedure to discuss WHEN TO RESUME the above activities.  °  °  °DRIVING °• Do not drive until you are seen for follow up and cleared by a provider. Generally, we ask patient to not drive for 1 week after their procedure. °• If you have been told by your doctor in the past that you may not drive, you must talk with him/her before you begin driving again. °  °DRESSING °• Groin site: you may leave the clear dressing over the site for up to one week or until it falls off. °  °HYGIENE °• If you had a femoral (leg) procedure, you may take a shower when you return home. After the shower, pat the site dry. Do NOT use powder, oils or lotions in your groin area until the site has completely healed. °• If you had a chest procedure, you may shower when you return home unless specifically instructed not to by your discharging practitioner. °            - DO NOT scrub incision; pat dry with a towel. °            - DO NOT apply any lotions, oils, powders to the incision. °            - No tub baths / swimming for at least 2 weeks. °• If you notice any fevers, chills, increased pain, swelling, bleeding or pus, please contact your doctor. °  °ADDITIONAL INFORMATION °• If you are going to have an upcoming dental procedure, please contact our office as you will require antibiotics ahead of time to prevent infection on your heart valve.  ° ° °If you have any  questions or concerns you can call the structural heart phone during normal business hours 8am-4pm. If you have an urgent need after hours or weekends please call [phone] to talk to the on call provider for general cardiology. If you have an emergency that requires immediate attention, please call 911.  ° ° °After TAVR Checklist ° °Check  Test Description  ° Follow up appointment in 1-2 weeks  You will see our structural heart physician assistant, Katie Square Jowett. Your incision sites will be checked and you will be cleared to drive and resume all normal activities if you are doing well.    ° 1 month echo and follow up  You will have an echo to check on your new heart valve and be seen back in the office by Katie Addisson Frate. Many times the echo is not read by your appointment time, but Katie will call you later that day or the following day to report your results.  ° Follow up with your primary cardiologist You will need to be seen by your primary cardiologist in the following 3-6 months after your 1 month appointment in the valve   clinic. Often times your Plavix or Aspirin will be discontinued during this time, but this is decided on a case by case basis.   ° 1 year echo and follow up You will have another echo to check on your heart valve after 1 year and be seen back in the office by Katie Geneen Dieter. This your last structural heart visit.  ° Bacterial endocarditis prophylaxis  You will have to take antibiotics for the rest of your life before all dental procedures (even teeth cleanings) to protect your heart valve. Antibiotics are also required before some surgeries. Please check with your cardiologist before scheduling any surgeries. Also, please make sure to tell us if you have a penicillin allergy as you will require an alternative antibiotic.   ° ° °

## 2020-08-14 NOTE — Progress Notes (Signed)
  [facility] VALVE TEAM  Patient doing well s/p TAVR. She is hemodynamically stable. Groin sites stable. ECG with sinus and no high grade block. Arterial line discontinued and transfer to 4E. Plan for early ambulation after bedrest completed and hopeful discharge over the next 24-48 hours.   Angelena Form PA-C  [facility]  Pager [phone]

## 2020-08-14 NOTE — CV Procedure (Signed)
[facility]  TAVR OPERATIVE NOTE   Date of Procedure:  08/14/2020  Preoperative Diagnosis: Severe Aortic Stenosis   Postoperative Diagnosis: Same   Procedure:    Transcatheter Aortic Valve Replacement - Left Subclavian Artery Approach  Edwards Sapien 3 THV (size 26 mm, model #[id number], serial # [id number])   Co-Surgeons:  Lauree Chandler, MD and Valentina Gu. Roxy Manns, MD   Anesthesiologist:  Hodierne  Echocardiographer:  Croitoru  Pre-operative Echo Findings:  Severe aortic stenosis       Normal left ventricular systolic function  Post-operative Echo Findings:  No paravalvular leak  Normal left ventricular systolic function  BRIEF CLINICAL NOTE AND INDICATIONS FOR SURGERY   Shannon Schultz is a 75 yo female with history of severe aortic stenosis, HTN, COPD, sleep apnea who is here today for TAVR. She is a former smoker. She is followed by pulmonology for COPD and is on nocturnal supplemental oxygen therapy. She has been followed by Dr. Sallyanne Kuster for moderate aortic stenosis. Recent progression of aortic stenosis to severe range with most recent echo showing LvEF=65-70%, severe AS with mean gradient 38 mmHg, AVA 0.7 cm2, DI 0.22 and SVI 34.   She has undergone cardiac cath and pre TAVR CT scans and is here today for TAVR.   During the course of the patient's preoperative work up they have been evaluated comprehensively by a multidisciplinary team of specialists coordinated through the [facility] in the [facility].  They have been demonstrated to suffer from symptomatic severe aortic stenosis as noted above. The patient has been counseled extensively as to the relative risks and benefits of all options for the treatment of severe aortic stenosis including long term medical therapy, conventional surgery for aortic valve replacement, and transcatheter aortic valve replacement.  The patient has been independently  evaluated by Dr. Roxy Manns with CT surgery and they are felt to be at high risk for conventional surgical aortic valve replacement. The surgeon indicated the patient would be a poor candidate for conventional surgery. Based upon review of all of the patient's preoperative diagnostic tests they are felt to be candidate for transcatheter aortic valve replacement using the transfemoral approach as an alternative to high risk conventional surgery.    Following the decision to proceed with transcatheter aortic valve replacement, a discussion has been held regarding what types of management strategies would be attempted intraoperatively in the event of life-threatening complications, including whether or not the patient would be considered a candidate for the use of cardiopulmonary bypass and/or conversion to open sternotomy for attempted surgical intervention.  The patient has been advised of a variety of complications that might develop peculiar to this approach including but not limited to risks of death, stroke, paravalvular leak, aortic dissection or other major vascular complications, aortic annulus rupture, device embolization, cardiac rupture or perforation, acute myocardial infarction, arrhythmia, heart block or bradycardia requiring permanent pacemaker placement, congestive heart failure, respiratory failure, renal failure, pneumonia, infection, other late complications related to structural valve deterioration or migration, or other complications that might ultimately cause a temporary or permanent loss of functional independence or other long term morbidity.  The patient provides full informed consent for the procedure as described and all questions were answered preoperatively.    DETAILS OF THE OPERATIVE PROCEDURE  PREPARATION:   The patient is brought to the operating room on the above mentioned date and central monitoring was established by the anesthesia team including placement of a radial arterial  line. The patient is placed in the supine position on the operating table.  Intravenous antibiotics are administered. General anesthesia is used.   Baseline transesophageal echocardiogram was performed. The patient's chest, abdomen, both groins, and both lower extremities are prepared and draped in a sterile manner. A time out procedure is performed.   PERIPHERAL ACCESS:   Using the modified Seldinger technique, femoral arterial and venous access were obtained with placement of a 6 Fr sheath in the artery and a 7 Fr sheath in the vein on the right side using u/s guidance.  A pigtail diagnostic catheter was passed through the femoral arterial sheath under fluoroscopic guidance into the aortic root.  A temporary transvenous pacemaker catheter was passed through the femoral venous sheath under fluoroscopic guidance into the right ventricle.  The pacemaker was tested to ensure stable lead placement and pacemaker capture. Aortic root angiography was performed in order to determine the optimal angiographic angle for valve deployment.  Left Subclavian artery access:  Please see Dr. Guy Sandifer operative note for details The left subclavian artery was exposed. A needle was used to gain access into the left subclavian artery. A J wire was then advanced into the ascending aorta. An AL-1 was used to direct a straight wire across the aortic valve. The AL-1 was advanced into the LV then exchanged over a J wire for a pigtail catheter.  The pigtail catheter was then exchanged for an Amplatz Extra-stiff wire in the LV apex.   A 14 Fr E-sheath was introduced into the left subclavian artery.    TRANSCATHETER HEART VALVE DEPLOYMENT:  An Edwards Sapien 3 THV (size 26 mm) was prepared and crimped per manufacturer's guidelines, and the proper orientation of the valve is confirmed on the [employer] delivery system. The valve was advanced through the introducer sheath using normal technique until in an appropriate position  in the ascending aorta. The balloon was then retracted and using the fine-tuning wheel was centered on the valve. The valve was then advanced across the aortic arch using appropriate flexion of the catheter. The valve was carefully positioned across the aortic valve annulus. The Commander catheter was retracted using normal technique. Once final position of the valve has been confirmed by angiographic assessment, the valve is deployed while temporarily holding ventilation and during rapid ventricular pacing to maintain systolic blood pressure < 50 mmHg and pulse pressure < 10 mmHg. The balloon inflation is held for >3 seconds after reaching full deployment volume. Once the balloon has fully deflated the balloon is retracted into the ascending aorta and valve function is assessed using TEE. There is felt to be no paravalvular leak and no central aortic insufficiency.  The patient's hemodynamic recovery following valve deployment is good.  The deployment balloon and guidewire are both removed. Echo demostrated acceptable post-procedural gradients, stable mitral valve function, and no AI.   PROCEDURE COMPLETION:  Please see Dr. Guy Sandifer note for details of sheath removal and closure of the artery with direct suturing. Protamine was administered once arterial repair was complete. The temporary pacemaker, pigtail catheters and femoral sheaths were removed with a Mynx closure device placed in the artery and manual pressure used for venous hemostasis.    The patient tolerated the procedure well and is transported to the surgical intensive care in stable condition. There were no immediate intraoperative complications. All sponge instrument and needle counts are verified correct at completion of the operation.   No blood products were administered during the operation.  The patient received  a total of 42 mL of intravenous contrast during the procedure.  Lauree Chandler MD 08/14/2020 8:17 AM

## 2020-08-14 NOTE — H&P (Signed)
Cardiology Admission History and Physical:   Patient ID: Shannon Schultz MRN: [id number]; DOB: 1946/05/20   Admission date: 08/14/2020  PCP:  [name], [place]  Cardiologist:  Croitoru  Chief Complaint:  Dyspnea   History of Present Illness:   Shannon Schultz is a 75 yo female with history of severe aortic stenosis, HTN, COPD, sleep apnea who is here today for TAVR. She is a former smoker. She is followed by pulmonology for COPD and is on nocturnal supplemental oxygen therapy. She has been followed by Dr. Sallyanne Kuster for moderate aortic stenosis. Recent progression of aortic stenosis to severe range with most recent echo showing LvEF=65-70%, severe AS with mean gradient 38 mmHg, AVA 0.7 cm2, DI 0.22 and SVI 34.   She has undergone cardiac cath and pre TAVR CT scans and is here today for TAVR.    Past Medical History:  Diagnosis Date  . Arthritis    "hands" (05/15/2017)  . Bladder cancer ([place]) 2018  . Breast cancer, right ([place]) 1992   DCIS,bladder ca (just dx)  . Colon polyp   . Complication of anesthesia 1992   "local anesthesia" used was hard to awaken from-no problems since (05/15/2017)  . COPD (chronic obstructive pulmonary disease) ([place])   . Coronary artery disease   . COVID-19 virus infection 05/05/2019  . Diverticulosis   . Elevated liver enzymes    fatty liver per ultrasound per pt  . FHx: BRCA2 gene positive    sister with BRCA2 mutation (pt tested NEGATIVE)  . HLD (hyperlipidemia)   . HSV (herpes simplex virus) infection    on hip--on daily suppression  . Hypertension   . Hypothyroidism    took med 7 yrs after birth of 1st child  . Impaired glucose tolerance   . Migraine   . On home oxygen therapy    "have it available but I'm not using it" (05/15/2017)  . Osteopenia   . Severe aortic stenosis   . Sleep apnea    Sleeps with 2 L O2 @ night  . Type 2 diabetes mellitus ([place])   . Ventral hernia   . Vitamin D deficiency disease      Past Surgical History:  Procedure Laterality Date  . ABDOMINAL HERNIA REPAIR  05/15/2017  . BREAST BIOPSY Right 1992  . BREAST BIOPSY Left 2018  . BREAST EXCISIONAL BIOPSY Left 2018   ATYPICAL DUCTAL HYPERPLASIA INVOLVING A COMPLEX  . BREAST LUMPECTOMY WITH RADIOACTIVE SEED LOCALIZATION Left 12/22/2016   Procedure: LEFT BREAST LUMPECTOMY WITH RADIOACTIVE SEED LOCALIZATION;  Surgeon: Jovita Kussmaul, MD;  Location: [facility];  Service: General;  Laterality: Left;  . CARDIAC CATHETERIZATION    . CATARACT EXTRACTION, BILATERAL Bilateral R 03/16/2019 L 03/30/2019   Dr. Kathlen Mody  . COLONOSCOPY  2009, 08/2010, 12/2015   Dr. Collene Mares; "only 1 had any polyps" (05/15/2017)  . CYSTOSCOPY  04/23/2017  . CYSTOSCOPY W/ RETROGRADES Bilateral 01/12/2017   Procedure: CYSTOSCOPY WITH RETROGRADE PYELOGRAM/ EXAM UNDER ANESTHESIA;  Surgeon: Raynelle Bring, MD;  Location: [facility];  Service: Urology;  Laterality: Bilateral;  . EYE SURGERY    . FEMUR IM NAIL Right 08/24/2019   Procedure: INTRAMEDULLARY (IM) NAIL FEMORAL;  Surgeon: Rod Can, MD;  Location: [facility];  Service: Orthopedics;  Laterality: Right;  . HERNIA REPAIR    . INSERTION OF MESH N/A 05/15/2017   Procedure: INSERTION OF MESH;  Surgeon: Jovita Kussmaul, MD;  Location: [facility];  Service: General;  Laterality:  N/A;  . LAPAROSCOPIC CHOLECYSTECTOMY  2003  . MASTECTOMY Right 1992  . RIGHT/LEFT HEART CATH AND CORONARY ANGIOGRAPHY N/A 07/05/2020   Procedure: RIGHT/LEFT HEART CATH AND CORONARY ANGIOGRAPHY;  Surgeon: Burnell Blanks, MD;  Location: [facility];  Service: Cardiovascular;  Laterality: N/A;  . TRANSURETHRAL RESECTION OF BLADDER TUMOR WITH MITOMYCIN-C N/A 01/12/2017   Procedure: TRANSURETHRAL RESECTION OF BLADDER TUMOR WITH POSSIBLE POST OPERATIVE INSTILLATION OF MITOMYCIN-C;  Surgeon: Raynelle Bring, MD;  Location: [facility];  Service: Urology;  Laterality: N/A;  . TYMPANOSTOMY TUBE PLACEMENT Bilateral 1980s  . UMBILICAL HERNIA REPAIR  2003   . VENTRAL HERNIA REPAIR N/A 05/15/2017   Procedure: VENTRAL HERNIA REPAIR WITH MESH;  Surgeon: Jovita Kussmaul, MD;  Location: [facility];  Service: General;  Laterality: N/A;     Medications Prior to Admission: Prior to Admission medications   Medication Sig Start Date End Date Taking? Authorizing Provider  acetaminophen (TYLENOL) 500 MG tablet Take 1,000 mg by mouth every 6 (six) hours as needed for moderate pain or headache.   Yes [provider]  albuterol (VENTOLIN HFA) 108 (90 Base) MCG/ACT inhaler Inhale 2 puffs into the lungs every 6 (six) hours as needed for wheezing or shortness of breath. 03/28/19  Yes [name], MD  aspirin EC 81 MG tablet Take 81 mg daily by mouth.   Yes [provider]  bisoprolol-hydrochlorothiazide (ZIAC) 10-6.25 MG tablet Take 1 tablet by mouth daily. 04/26/20  Yes [name], MD  CALCIUM-MAGNESIUM-VITAMIN D PO Take 1 tablet by mouth daily.   Yes [provider]  Calcium-Magnesium-Zinc (CAL-MAG-ZINC PO) Take 1 tablet by mouth daily.   Yes [provider]  cetirizine (ZYRTEC) 10 MG tablet Take 10 mg by mouth daily.   Yes [provider]  fluticasone (FLONASE) 50 MCG/ACT nasal spray Place 2 sprays into both nostrils daily. Patient taking differently: Place 2 sprays into both nostrils at bedtime. 04/26/20  Yes [name], MD  guaiFENesin (MUCINEX) 600 MG 12 hr tablet Take 600 mg by mouth 2 (two) times daily.   Yes [provider]  metFORMIN (GLUCOPHAGE-XR) 500 MG 24 hr tablet Take 1 tablet (500 mg total) by mouth 2 (two) times daily before a meal. 04/26/20  Yes [name], MD  montelukast (SINGULAIR) 10 MG tablet Take 1 tablet (10 mg total) by mouth at bedtime. 03/20/20  Yes [name], MD  predniSONE (DELTASONE) 50 MG tablet Take as directed prior to 2/15 surgery 08/02/20  Yes Burnell Blanks, MD  simvastatin (ZOCOR) 20 MG tablet Take 1 tablet (20 mg total) by mouth at bedtime. 04/26/20  Yes  [name], MD  triamcinolone cream (KENALOG) 0.1 % Apply 1 application topically 3 (three) times a week. 12/07/18  Yes [provider]  umeclidinium-vilanterol (ANORO ELLIPTA) 62.5-25 MCG/INH AEPB Inhale 1 puff into the lungs daily. Patient taking differently: Inhale 2 puffs into the lungs daily. 07/10/20  Yes [name], MD  valACYclovir (VALTREX) 500 MG tablet Take 1 pill by mouth twice daily for 3 days; start at earliest onset of herpes flare Patient taking differently: Take 500 mg by mouth daily. 07/11/20  Yes [name], MD  ACCU-CHEK AVIVA PLUS test strip TEST BLOOD SUGAR ONE TO TWO TIMES DAILY 07/04/20   [name], MD  Accu-Chek Softclix Lancets lancets Test BS 1-2 times daily. Pt uses accu-chek meter 12/21/19   Henson, Vickie L, NP-C  albuterol (PROVENTIL) (2.5 MG/3ML) 0.083% nebulizer solution USE 1 VIAL BY NEBULIZATION  EVERY FOUR HOURS AS NEEDED FOR WHEEZING OR SHORTNESS OF BREATH. Patient taking differently: Take 2.5 mg by nebulization every 4 (four) hours as needed for wheezing. 05/06/19   Tysinger, Camelia Eng, PA-C  Alcohol Swabs (B-D SINGLE USE SWABS REGULAR) PADS Use twice a day when checking blood sugars 12/21/19   [name], MD  diphenhydrAMINE (BENADRYL) 50 MG tablet Take one 50 mg tablet the morning of the procedure with the last dose of the prednisone. 06/25/20   Croitoru, Mihai, MD  furosemide (LASIX) 20 MG tablet TAKE 1 TABLET (20 MG TOTAL) BY MOUTH DAILY AS NEEDED FOR FLUID OR EDEMA. 10/27/17   [name], MD  [place] TEST BLOOD SUGAR ONE TO TWO TIMES DAILY 03/21/20   [name], MD     Allergies:    Allergies  Allergen Reactions  . Contrast Media [Iodinated Diagnostic Agents] Anaphylaxis, Shortness Of Breath and Other (See Comments)    Could not breath  . Iohexol Anaphylaxis, Shortness Of Breath and Other (See Comments)    Immediately could not breathe  . Lisinopril Anaphylaxis, Shortness Of Breath and Rash  . Sulfa Antibiotics Anaphylaxis and  Other (See Comments)    Historical from mother, pt states that mother says she almost died from this drug  . Latex Other (See Comments)    Unless against on skin for a long time, blisters. Short term is okay.  . Nickel Other (See Comments)    With earrings pt has soreness and drainage from piercing  . Codeine Nausea Only, Anxiety and Other (See Comments)    insomnia  . Levofloxacin Other (See Comments)    insomnia  . Lipitor [Atorvastatin Calcium] Rash  . Vicodin [Hydrocodone-Acetaminophen] Itching    Social History:   Social History   Socioeconomic History  . Marital status: Married    Spouse name: Not on file  . Number of children: 3  . Years of education: Not on file  . Highest education level: Not on file  Occupational History  . Occupation: Retired  Tobacco Use  . Smoking status: Former Smoker    Packs/day: 1.00    Years: 46.00    Pack years: 46.00    Types: Cigarettes    Quit date: 05/31/2011    Years since quitting: 9.2  . Smokeless tobacco: Never Used  Vaping Use  . Vaping Use: Never used  Substance and Sexual Activity  . Alcohol use: No  . Drug use: No  . Sexual activity: Yes    Partners: Male    Birth control/protection: Post-menopausal  Other Topics Concern  . Not on file  Social History Narrative   Lives with her husband.  Children all live in [place] nearby. No pets. 6 grandchildren.   Social Determinants of Health   Financial Resource Strain: Not on file  Food Insecurity: Not on file  Transportation Needs: Not on file  Physical Activity: Not on file  Stress: Not on file  Social Connections: Not on file  Intimate Partner Violence: Not on file    Family History:   The patient's family history includes Allergies in her sister and sister; Asthma in her sister and sister; Breast cancer (age of onset: 63) in her sister; Cancer in her maternal grandmother and another family member; Diabetes in her father; Fibromyalgia in her sister and sister; Hashimoto's  thyroiditis in her sister and sister; Heart disease in her father and mother; Hyperlipidemia in her sister and sister; Hypertension in her father; Stroke (age of onset: 30)  in her paternal grandmother; Tuberculosis in her maternal grandfather.    ROS:  Please see the history of present illness.  All other ROS reviewed and negative.     Physical Exam/Data:   Vitals:   08/14/20 0602  BP: (!) 162/64  Pulse: 84  Resp: 18  Temp: 97.6 F (36.4 C)  TempSrc: Oral  SpO2: 96%  Weight: 103 kg  Height: '5\' 4"'  (1.626 m)   No intake or output data in the 24 hours ending 08/14/20 0629 Last 3 Weights 08/14/2020 08/10/2020 07/25/2020  Weight (lbs) 227 lb 227 lb 6.4 oz 228 lb  Weight (kg) 102.967 kg 103.148 kg 103.42 kg     Body mass index is 38.96 kg/m.  General:  Well nourished, well developed, in no acute distress HEENT: normal Lymph: no adenopathy Neck: no JVD Endocrine:  No thryomegaly Vascular: No carotid bruits; FA pulses 2+ bilaterally without bruits  Cardiac:  normal S1, S2; RRR; systolic murmur Lungs:  clear to auscultation bilaterally, no wheezing, rhonchi or rales  Abd: soft, nontender, no hepatomegaly  Ext: no LE edema Musculoskeletal:  No deformities, BUE and BLE strength normal and equal Skin: warm and dry  Neuro:  CNs 2-12 intact, no focal abnormalities noted Psych:  Normal affect   Relevant CV Studies: ECHOCARDIOGRAM REPORT       Patient Name:  Shannon Schultz Date of Exam: 06/19/2020  Medical Rec #: [id number]   Height:    64.0 in  Accession #:  [id number]  Weight:    222.6 lb  Date of Birth: 1946-03-23  BSA:     2.047 m  Patient Age:  77 years   BP:      151/77 mmHg  Patient Gender: F       HR:      83 bpm.  Exam Location: Inpatient   Procedure: 2D Echo, Cardiac Doppler and Color Doppler   Indications:  Aortic Stenosis    History:    Patient has prior history of Echocardiogram examinations,  most          recent 06/29/2019. COPD, Aortic Valve Disease,         Signs/Symptoms:Murmur; Risk Factors:Hypertension and  Diabetes.         COVID-19 virus infection (From Hx).    Sonographer:  Alvino Chapel RCS  Referring Phys: [place]    1. Left ventricular ejection fraction, by estimation, is 65 to 70%. The  left ventricle has normal function. The left ventricle has no regional  wall motion abnormalities. There is mild left ventricular hypertrophy.  Left ventricular diastolic parameters  are consistent with Grade I diastolic dysfunction (impaired relaxation).  2. Right ventricular systolic function is normal. The right ventricular  size is normal. Tricuspid regurgitation signal is inadequate for assessing  PA pressure.  3. The mitral valve is abnormal. Trivial mitral valve regurgitation.  4. The aortic valve is tricuspid. Aortic valve regurgitation is not  visualized. Severe aortic valve stenosis. Aortic valve area, by VTI  measures 0.70 cm. Aortic valve mean gradient measures 38.0 mmHg. Aortic  valve Vmax measures 3.83 m/s. Peak gradient  59 mmHg. DI is 0.22.  5. The inferior vena cava is normal in size with greater than 50%  respiratory variability, suggesting right atrial pressure of 3 mmHg.  6. Left atrial size was mildly dilated.   Comparison(s): Changes from prior study are noted. 06/29/19: LVEF 65-70%,  moderate to severe AS - mean gradient 29 mmHg, DI 0.26.  FINDINGS  Left Ventricle: Left ventricular ejection fraction, by estimation, is 65  to 70%. The left ventricle has normal function. The left ventricle has no  regional wall motion abnormalities. The left ventricular internal cavity  size was normal in size. There is  mild left ventricular hypertrophy. Left ventricular diastolic parameters  are consistent with Grade I diastolic dysfunction (impaired relaxation).  Indeterminate filling pressures.   Right Ventricle:  The right ventricular size is normal. No increase in  right ventricular wall thickness. Right ventricular systolic function is  normal. Tricuspid regurgitation signal is inadequate for assessing PA  pressure.   Left Atrium: Left atrial size was mildly dilated.   Right Atrium: Right atrial size was normal in size.   Pericardium: There is no evidence of pericardial effusion.   Mitral Valve: The mitral valve is abnormal. Mild to moderate mitral  annular calcification. Trivial mitral valve regurgitation.   Tricuspid Valve: The tricuspid valve is grossly normal. Tricuspid valve  regurgitation is trivial.   Aortic Valve: The aortic valve is tricuspid. Aortic valve regurgitation is  not visualized. Severe aortic stenosis is present. Aortic valve mean  gradient measures 38.0 mmHg. Aortic valve peak gradient measures 58.7  mmHg. Aortic valve area, by VTI measures  0.70 cm.   Pulmonic Valve: The pulmonic valve was normal in structure. Pulmonic valve  regurgitation is not visualized.   Aorta: The aortic root and ascending aorta are structurally normal, with  no evidence of dilitation.   Venous: The inferior vena cava is normal in size with greater than 50%  respiratory variability, suggesting right atrial pressure of 3 mmHg.   IAS/Shunts: No atrial level shunt detected by color flow Doppler.     LEFT VENTRICLE  PLAX 2D  LVIDd:     4.30 cm Diastology  LVIDs:     2.50 cm LV e' medial:  4.35 cm/s  LV PW:     1.40 cm LV E/e' medial: 16.9  LV IVS:    1.00 cm LV e' lateral:  5.00 cm/s  LVOT diam:   2.00 cm LV E/e' lateral: 14.7  LV SV:     70  LV SV Index:  34  LVOT Area:   3.14 cm     RIGHT VENTRICLE  RV S prime:   17.50 cm/s  TAPSE (M-mode): 3.4 cm   LEFT ATRIUM       Index    RIGHT ATRIUM      Index  LA diam:    4.50 cm 2.20 cm/m RA Area:   17.20 cm  LA Vol (A2C):  70.6 ml 34.48 ml/m RA Volume:  43.60 ml 21.29  ml/m  LA Vol (A4C):  57.6 ml 28.16 ml/m  LA Biplane Vol: 67.9 ml 33.16 ml/m  AORTIC VALVE  AV Area (Vmax):  0.79 cm  AV Area (Vmean):  0.69 cm  AV Area (VTI):   0.70 cm  AV Vmax:      383.00 cm/s  AV Vmean:     289.000 cm/s  AV VTI:      1.000 m  AV Peak Grad:   58.7 mmHg  AV Mean Grad:   38.0 mmHg  LVOT Vmax:     96.40 cm/s  LVOT Vmean:    63.700 cm/s  LVOT VTI:     0.223 m  LVOT/AV VTI ratio: 0.22    AORTA  Ao Root diam: 2.70 cm   MITRAL VALVE  MV Area (PHT): 2.01 cm  SHUNTS  MV Decel Time: 377 msec  Systemic VTI: 0.22 m  MR Peak grad: 130.4 mmHg  Systemic Diam: 2.00 cm  MR Mean grad: 91.0 mmHg  MR Vmax:   571.00 cm/s  MR Vmean:   461.0 cm/s  MV E velocity: 73.30 cm/s  MV A velocity: 98.20 cm/s  MV E/A ratio: 0.75   Lyman Bishop MD  Electronically signed by Lyman Bishop MD  Signature Date/Time: 06/19/2020/2:26:48 PM    RIGHT/LEFT HEART CATH AND CORONARY ANGIOGRAPHY    Conclusion    Mid Cx lesion is 30% stenosed.  Mid LAD lesion is 20% stenosed.  Mild non-obstructive CAD Severe aortic stenosis by echo. Cath data with mean gradient of 34.6 mmHg, peak to peak gradient 44 mmHg).   Recommendations: Will continue workup for TAVR   Recommendations  Antiplatelet/Anticoag Will continue workup for TAVR   Indications  Severe aortic stenosis [I35.0 (ICD-10-CM)]   Procedural Details  Technical Details Indication: Severe aortic stenosis  Procedure: The risks, benefits, complications, treatment options, and expected outcomes were discussed with the patient. The patient and/or family concurred with the proposed plan, giving informed consent. The patient was brought to the cath lab after IV hydration was given. The patient was sedated with Versed and Fentanyl. The IV catheter present in the right antecubital vein was changed for a 5 [place] sheath. Right heart catheterization performed with  a balloon tipped catheter. The right wrist was prepped and draped in a sterile fashion. 1% lidocaine was used for local anesthesia. Using the modified Seldinger access technique, a 5 French sheath was placed in the right radial artery. 3 mg Verapamil was given through the sheath. 5000 units IV heparin was given. Standard diagnostic catheters were used to perform selective coronary angiography. An AL-2 catheter was used to cross the aortic valve with a J wire. LV pressures measured. The sheath was removed from the right radial artery and a Terumo hemostasis band was applied at the arteriotomy site on the right wrist.    Estimated blood loss <50 mL.   During this procedure medications were administered to achieve and maintain moderate conscious sedation while the patient's heart rate, blood pressure, and oxygen saturation were continuously monitored and I was present face-to-face 100% of this time.   Medications (Filter: Administrations occurring from 0720 to 0823 on 07/05/20)  midazolam (VERSED) injection (mg) Total dose:  1 mg  Date/Time Rate/Dose/Volume Action   07/05/20 0745 1 mg Given    fentaNYL (SUBLIMAZE) injection (mcg) Total dose:  25 mcg  Date/Time Rate/Dose/Volume Action   07/05/20 0746 25 mcg Given    lidocaine (PF) (XYLOCAINE) 1 % injection (mL) Total volume:  5 mL  Date/Time Rate/Dose/Volume Action   07/05/20 0757 5 mL Given    Radial Cocktail/Verapamil only (mL) Total volume:  10 mL  Date/Time Rate/Dose/Volume Action   07/05/20 0758 10 mL Given    heparin sodium (porcine) injection (Units) Total dose:  5,000 Units  Date/Time Rate/Dose/Volume Action   07/05/20 0806 5,000 Units Given    iohexol (OMNIPAQUE) 350 MG/ML injection (mL) Total volume:  60 mL  Date/Time Rate/Dose/Volume Action   07/05/20 0822 60 mL Given    Heparin (Porcine) in NaCl 1000-0.9 UT/500ML-% SOLN (mL) Total volume:  1,000 mL  Date/Time Rate/Dose/Volume Action    07/05/20 0822 500 mL Given   0822 500 mL Given     Sedation Time  Sedation Time Physician-1: 30 minutes 42 seconds   Contrast  Medication Name Total Dose  iohexol (OMNIPAQUE) 350 MG/ML injection 60 mL  Radiation/Fluoro  Fluoro time: 4.4 (min) DAP: 15.5 (Gycm2) Cumulative Air Kerma: 450 (mGy)   Complications   Complications documented before study signed (07/05/2020 8:25 AM)     RIGHT/LEFT HEART CATH AND CORONARY ANGIOGRAPHY  None Documented by Burnell Blanks, MD 07/05/2020 8:23 AM  Date Found: 07/05/2020  Time Range: Intraprocedure       Coronary Findings   Diagnostic Dominance: Right  Left Anterior Descending  Vessel is large.  Mid LAD lesion is 20% stenosed.  Left Circumflex  Vessel is large.  Mid Cx lesion is 30% stenosed.  First Obtuse Marginal Branch  Vessel is moderate in size.  Right Coronary Artery  Vessel is large.   Intervention   No interventions have been documented.  Coronary Diagrams   Diagnostic Dominance: Right    Intervention    Implants    No implant documentation for this case.    Syngo Images  Show images for CARDIAC CATHETERIZATION  Images on Long Term Storage  Show images for Reisha, Wos to Procedure Log  Procedure Log     Hemo Data  Flowsheet Row Most Recent Value  Fick Cardiac Output 12.59 L/min  Fick Cardiac Output Index 6.17 (L/min)/BSA  Aortic Mean Gradient 34.56 mmHg  Aortic Peak Gradient 44 mmHg  Aortic Valve Area 1.57  Aortic Value Area Index 0.77 cm2/BSA  RA A Wave 11 mmHg  RA V Wave 8 mmHg  RA Mean 9 mmHg  RV Systolic Pressure 47 mmHg  RV Diastolic Pressure 9 mmHg  RV EDP 11 mmHg  PA Systolic Pressure 41 mmHg  PA Diastolic Pressure 22 mmHg  PA Mean 32 mmHg  PW A Wave 26 mmHg  PW V Wave 24 mmHg  PW Mean 20 mmHg  AO Systolic Pressure 388 mmHg  AO Diastolic Pressure 76 mmHg  AO Mean 828 mmHg  LV Systolic  Pressure 003 mmHg  LV Diastolic Pressure 20 mmHg  LV EDP 23 mmHg  AOp Systolic Pressure 491 mmHg  AOp Diastolic Pressure 75 mmHg  AOp Mean Pressure 791 mmHg  LVp Systolic Pressure 505 mmHg  LVp Diastolic Pressure 20 mmHg  LVp EDP Pressure 24 mmHg  QP/QS 1  TPVR Index 5.19 HRUI  TSVR Index 16.35 HRUI  PVR SVR Ratio 0.13  TPVR/TSVR Ratio 0.32     Cardiac TAVR CT  TECHNIQUE: The patient was scanned on a Siemens Force 697 slice scanner. A 120 kV retrospective scan was triggered in the ascending thoracic aorta at 140 HU's. Gantry rotation speed was 250 msecs and collimation was .6 mm. No beta blockade or nitro were given. The 3D data set was reconstructed in 5% intervals of the R-R cycle. Systolic and diastolic phases were analyzed on a dedicated work station using MPR, MIP and VRT modes. The patient received 80 cc of contrast.  FINDINGS: Aortic Valve:  Aorta: No aneurysm normal arch vessels severe calcific atherosclerosis particularly in the aortic arch  Sino-tubular Junction: 25 mm  Ascending Thoracic Aorta: 30 mm  Aortic Arch: 22 mm  Descending Thoracic Aorta: 27 mm  Sinus of Valsalva Measurements:  Non-coronary: 29.3 mm  Right - coronary: 26.4 mm  Left - coronary: 30 mm  Coronary Artery Height above Annulus:  Left Main: 12.5 mm above annulus  Right Coronary: 11.9 mm above annulus  Virtual Basal Annulus Measurements:  Maximum / Minimum Diameter: 26 mm x 23.8 mm  Perimeter: 80.9 mm cc  Area: 479 mm 2  Coronary Arteries: Sufficient height above annulus for deployment  Optimum Fluoroscopic Angle for Delivery: LAO 14 Caudal 14 degrees  IMPRESSION: 1. Calcified tri leaflet AV with annular area of 479 mm2 suitable for a 26 mm Sapien 3 valve  2. Coronary arteries sufficient height above annulus for deployment  3. Optimum angiographic angle for deployment LAO 14 Caudal 14 degrees  4. Normal aortic root 3.0 cm with  severe calcific atherosclerosis  Jenkins Rouge  Electronically Signed: By: Jenkins Rouge M.D. On: 07/13/2020 13:01    CT ANGIOGRAPHY CHEST, ABDOMEN AND PELVIS  TECHNIQUE: Non-contrast CT of the chest was initially obtained.  Multidetector CT imaging through the chest, abdomen and pelvis was performed using the standard protocol during bolus administration of intravenous contrast. Multiplanar reconstructed images and MIPs were obtained and reviewed to evaluate the vascular anatomy.  CONTRAST: 46m OMNIPAQUE IOHEXOL 350 MG/ML SOLN  COMPARISON: CTA of the chest, abdomen and pelvis 07/13/2020.  FINDINGS: CTA CHEST FINDINGS  Cardiovascular: Heart size is normal. There is no significant pericardial fluid, thickening or pericardial calcification. There is aortic atherosclerosis, as well as atherosclerosis of the great vessels of the mediastinum and the coronary arteries, including calcified atherosclerotic plaque in the left main, left anterior descending, left circumflex and right coronary arteries. Thickening and calcification of the aortic valve. Mild to moderate calcifications of the mitral annulus.  Mediastinum/Lymph Nodes: No pathologically enlarged mediastinal or hilar lymph nodes. Esophagus is unremarkable in appearance. No axillary lymphadenopathy. Numerous surgical clips are noted in the right axillary region.  Lungs/Pleura: No suspicious appearing pulmonary nodules or masses are noted. No acute consolidative airspace disease. No pleural effusions. Diffuse bronchial wall thickening with mild centrilobular and paraseptal emphysema.  Musculoskeletal/Soft Tissues: Status post right modified radical mastectomy and right axillary lymph node dissection. There are no aggressive appearing lytic or blastic lesions noted in the visualized portions of the skeleton.  CTA ABDOMEN AND PELVIS FINDINGS  Hepatobiliary: Liver has a shrunken appearance and  nodular contour, indicative of underlying cirrhosis. No discrete cystic or solid hepatic lesions. No intra or extrahepatic biliary ductal dilatation. Status post cholecystectomy.  Pancreas: No pancreatic mass. No pancreatic ductal dilatation. No pancreatic or peripancreatic fluid collections or inflammatory changes.  Spleen: Spleen is enlarged measuring 12.8 x 7.7 x 16.9 cm (estimated splenic volume of 833 mL) .  Adrenals/Urinary Tract: Bilateral kidneys and adrenal glands are normal in appearance. No hydroureteronephrosis. Urinary bladder is normal in appearance.  Stomach/Bowel: Normal appearance of the stomach. No pathologic dilatation of small bowel or colon. Numerous colonic diverticulae are noted, most evident in the sigmoid colon, without surrounding inflammatory changes to suggest an acute diverticulitis at this time. Normal appendix.  Vascular/Lymphatic: Aortic atherosclerosis, with vascular findings and measurements pertinent to potential TAVR procedure, as detailed below. No lymphadenopathy noted in the abdomen or pelvis.  Reproductive: Uterus and ovaries are atrophic.  Other: No significant volume of ascites. No pneumoperitoneum.  Musculoskeletal: Status post ORIF in the proximal right femur. There are no aggressive appearing lytic or blastic lesions noted in the visualized portions of the skeleton.  VASCULAR MEASUREMENTS PERTINENT TO TAVR:  AORTA:  Minimal Aortic Diameter-14 x 13 mm  Severity of Aortic Calcification-severe  RIGHT PELVIS:  Right Common Iliac Artery -  Minimal Diameter-8.2 x 7.1 mm  Tortuosity-mild  Calcification-severe  Right External Iliac Artery -  Minimal Diameter-5.4 x 6.3 mm  Tortuosity-moderate  Calcification-moderate to severe  Right Common Femoral Artery -  Minimal Diameter-5.3 x 7.8 mm  Tortuosity-mild  Calcification-moderate  LEFT PELVIS:  Left Common Iliac Artery -  Minimal  Diameter-6.7 x 5.4 mm  Tortuosity-mild  Calcification-severe  Left External Iliac Artery -  Minimal Diameter-7.3 x 5.2 mm  Tortuosity-moderate  Calcification-moderate to severe  Left Common Femoral Artery -  Minimal Diameter-5.4 x 4.0 mm  Tortuosity-mild  Calcification-moderate  Review of the MIP images confirms the above findings.  IMPRESSION: 1. Vascular findings and measurements pertinent to potential TAVR procedure, as detailed above. 2. Severe thickening calcification of the aortic valve, compatible with reported clinical history of severe aortic stenosis. 3. Cirrhosis and splenomegaly. 4. Colonic diverticulosis without evidence of acute diverticulitis at this time. 5. Additional incidental findings, as above, similar to the prior study.   Electronically Signed By: Vinnie Langton M.D. On: 07/20/2020 14:22   ______________________   STS score Procedure: Isolated AVR   Risk of Mortality:3.655%  Renal Failure: 4.011%  Permanent Stroke: 0.909%  Prolonged Ventilation: 10.449%  DSW Infection:0.386%  Reoperation: 4.897%  Morbidity or Mortality: 16.623%  Short Length of Stay: 29.858%  Long Length of Stay: 9.788%     Laboratory Data:  High Sensitivity Troponin:  No results for input(s): TROPONINIHS in the last 720 hours.    Chemistry Recent Labs  Lab 08/10/20 1026  NA 140  K 4.1  CL 108  CO2 19*  GLUCOSE 200*  BUN 20  CREATININE 0.63  CALCIUM 9.4  GFRNONAA >60  ANIONGAP 13    Recent Labs  Lab 08/10/20 1026  PROT 6.4*  ALBUMIN 3.8  AST 32  ALT 23  ALKPHOS 54  BILITOT 0.8   Hematology Recent Labs  Lab 08/10/20 1026  WBC 5.0  RBC 4.50  HGB 13.4  HCT 40.7  MCV 90.4  MCH 29.8  MCHC 32.9  RDW 15.3  PLT 129*   BNPNo results for input(s): BNP, PROBNP in the last 168 hours.  DDimer No results for input(s): DDIMER in the last 168 hours.   Radiology/Studies:  No results found.   Assessment and Plan:    Severe aortic stenosis: Plan for TAVR today from transfemoral approach  For questions or updates, please contact [place] Please consult [email or web address] for contact info under     Signed, Lauree Chandler, MD  08/14/2020 6:29 AM

## 2020-08-15 ENCOUNTER — Encounter (HOSPITAL_COMMUNITY): Payer: Self-pay | Admitting: Cardiovascular Disease

## 2020-08-15 ENCOUNTER — Inpatient Hospital Stay (HOSPITAL_COMMUNITY): Payer: Medicare HMO

## 2020-08-15 DIAGNOSIS — I350 Nonrheumatic aortic (valve) stenosis: Secondary | ICD-10-CM | POA: Diagnosis not present

## 2020-08-15 DIAGNOSIS — Z006 Encounter for examination for normal comparison and control in clinical research program: Secondary | ICD-10-CM | POA: Diagnosis not present

## 2020-08-15 DIAGNOSIS — Z8616 Personal history of COVID-19: Secondary | ICD-10-CM | POA: Diagnosis not present

## 2020-08-15 DIAGNOSIS — Z952 Presence of prosthetic heart valve: Secondary | ICD-10-CM

## 2020-08-15 DIAGNOSIS — J9611 Chronic respiratory failure with hypoxia: Secondary | ICD-10-CM | POA: Diagnosis not present

## 2020-08-15 LAB — BASIC METABOLIC PANEL
Anion gap: 9 (ref 5–15)
BUN: 21 mg/dL (ref 8–23)
CO2: 23 mmol/L (ref 22–32)
Calcium: 9.1 mg/dL (ref 8.9–10.3)
Chloride: 107 mmol/L (ref 98–111)
Creatinine, Ser: 0.7 mg/dL (ref 0.44–1.00)
GFR, Estimated: >60 mL/min (ref >60)
Glucose, Bld: 121 mg/dL — ABNORMAL HIGH (ref 70–99)
Potassium: 3.8 mmol/L (ref 3.5–5.1)
Sodium: 139 mmol/L (ref 135–145)

## 2020-08-15 LAB — ECHOCARDIOGRAM COMPLETE
AR max vel: 1.35 cm2
AV Area VTI: 1.38 cm2
AV Area mean vel: 1.38 cm2
AV Mean grad: 8.7 mmHg
AV Peak grad: 17.5 mmHg
Ao pk vel: 2.09 m/s
Area-P 1/2: 2.56 cm2
Height: 64 in
S' Lateral: 2.8 cm
Weight: 3647.29 oz

## 2020-08-15 LAB — CBC
HCT: 35.4 % — ABNORMAL LOW (ref 36.0–46.0)
Hemoglobin: 11.3 g/dL — ABNORMAL LOW (ref 12.0–15.0)
MCH: 29 pg (ref 26.0–34.0)
MCHC: 31.9 g/dL (ref 30.0–36.0)
MCV: 90.8 fL (ref 80.0–100.0)
Platelets: 117 10*3/uL — ABNORMAL LOW (ref 150–400)
RBC: 3.9 MIL/uL (ref 3.87–5.11)
RDW: 15.7 % — ABNORMAL HIGH (ref 11.5–15.5)
WBC: 6.8 10*3/uL (ref 4.0–10.5)
nRBC: 0 % (ref 0.0–0.2)

## 2020-08-15 LAB — MAGNESIUM: Magnesium: 1.9 mg/dL (ref 1.7–2.4)

## 2020-08-15 LAB — GLUCOSE, CAPILLARY: Glucose-Capillary: 110 mg/dL — ABNORMAL HIGH (ref 70–99)

## 2020-08-15 MED ORDER — CLOPIDOGREL BISULFATE 75 MG PO TABS: 75.0000 mg | ORAL_TABLET | Freq: Every day | ORAL | 1 refills | Status: DC

## 2020-08-15 NOTE — Progress Notes (Signed)
CARDIAC REHAB PHASE I   PRE:  Rate/Rhythm: 93 SR    BP: sitting 109/41    SaO2: 94 RA  MODE:  Ambulation: 230 ft   POST:  Rate/Rhythm: 133 ST    BP: sitting      SaO2: 77 RA, up to 98 2 1/2L 2nd attempt  MODE:  Ambulation: 430 ft   POST:  Rate/Rhythm: 133 ST    BP: sitting 126/61     SaO2: 89 2L  Attempted to ambulate on RA as pt does not wear O2 during the day at home. Used RW. After 115 ft pt SAO2 77 RA, HR 129 ST. Some SOB. Pt ambulated back to room, still 77 RA. Applied O2, SaO2 up to 98 on 2 1/2L.   Pt ambulated again with RW and 2L. SaO2 maintained 89-90 2L, HR up again 133 ST. Pt rested x2 to keep O2 up. Minimal c/o, return to chair. Discussed with pt that she should wear O2 when up in house but does not need it at rest. Encouraged her to use her pulse ox. Hopefully low SaO2 with activity are temporal. Discussed increasing activity/exercise. Pt is interested in CRPII. Will refer to [place]. [place], ACSM 08/15/2020 9:07 AM

## 2020-08-15 NOTE — Discharge Summary (Signed)
[facility] VALVE TEAM  Discharge Summary    Patient ID: Shannon Schultz MRN: [id number]; DOB: [birth year]  Admit date: 08/14/2020 Discharge date: 08/15/2020  Primary Care Provider: [name], MD  Primary Cardiologist: Candee Furbish, MD / Dr. Angelena Form & Dr. Roxy Manns (TAVR)  Discharge Diagnoses    Principal Problem:   S/P TAVR (transcatheter aortic valve replacement) Active Problems:   Pure hypercholesterolemia   Essential hypertension, benign   HSV (herpes simplex virus) infection   Diabetes mellitus type 2 in obese (HCC)   Class 2 severe obesity due to excess calories with serious comorbidity and body mass index (BMI) of 39.0 to 39.9 in adult [facility])   Urothelial cancer ([place])   Cirrhosis of liver without ascites ([place])   Chronic hypoxemic respiratory failure (HCC)   COPD, group B, by GOLD 2017 classification ([place])   Severe aortic stenosis   Allergies Allergies  Allergen Reactions  . Contrast Media [Iodinated Diagnostic Agents] Anaphylaxis, Shortness Of Breath and Other (See Comments)    Could not breath  . Iohexol Anaphylaxis, Shortness Of Breath and Other (See Comments)    Immediately could not breathe  . Lisinopril Anaphylaxis, Shortness Of Breath and Rash  . Sulfa Antibiotics Anaphylaxis and Other (See Comments)    Historical from mother, pt states that mother says she almost died from this drug  . Latex Other (See Comments)    Unless against on skin for a long time, blisters. Short term is okay.  . Nickel Other (See Comments)    With earrings pt has soreness and drainage from piercing  . Codeine Nausea Only, Anxiety and Other (See Comments)    insomnia  . Levofloxacin Other (See Comments)    insomnia  . Lipitor [Atorvastatin Calcium] Rash  . Vicodin [Hydrocodone-Acetaminophen] Itching    Diagnostic Studies/Procedures    TAVR OPERATIVE NOTE   Date of Procedure:                08/14/2020  Preoperative Diagnosis:      Severe  Aortic Stenosis   Postoperative Diagnosis:    Same   Procedure:        Transcatheter Aortic Valve Replacement - Left Trans-Subclavian Approach             Edwards Sapien 3 UltraTHV (size 26 mm, model # 9750TFX, serial # [id number])              Co-Surgeons:                        Valentina Gu. Roxy Manns, MD and Lauree Chandler, MD  Anesthesiologist:                  Albertha Ghee, MD  Echocardiographer:              Sanda Klein, MD  Pre-operative Echo Findings: ? Severe aortic stenosis ? Normal left ventricular systolic function  Post-operative Echo Findings: ? No paravalvular leak ? Normal left ventricular systolic function  _____________   Echo 08/15/20: complete but pending formal read at the time of discharge    History of Present Illness     Shannon Schultz is a 75 y.o. female with a history of breast cancer, bladder cancer, HTN, HLD, COPD with nocturnal home oxygen therapy and obstructive sleep apnea , DMT2, morbid obesity, NAFLD, AAA and severe AS who presented to [facility] on 08/14/20 for planned TAVR.   Patient reports a long history of  chronic exertional shortness of breath that until recently has been largely attributed to COPD.  She quit smoking in 2012 and has been followed by a pulmonologist.  Spirometry has revealed evidence of mild to moderate obstruction that has remained stable.  She has been treated with long term inhalers and overall done fairly well.  Approximately 1 year ago she fell and broke her hip. Her recovery has been quite slow in her mobility fairly limited.  She continues to describe stable symptoms of exertional shortness of breath that occur with moderate to low level activity.    Patient states she was first told she had a heart murmur less than 10 years ago and she has been followed for the last several years by Dr. Sallyanne Kuster with known history of aortic stenosis. Recent follow-up echocardiogram revealed significant progression and severity of aortic  stenosis with preserved left ventricular function.  The aortic valve appeared trileaflet with severe thickening, calcification, and restricted leaflet mobility.  Peak velocity across aortic valve measured 3.8 m/s corresponding to mean transvalvular gradient estimated 38 mmHg and aortic valve area calculated only 0.70 cm by VTI.  DVI was reported 0.22 with stroke-volume index 34.  Left ventricular systolic function remain normal with ejection fraction estimated 65 to 70%. The patient was referred to the multidisciplinary heart valve clinic.  Diagnostic cardiac catheterization performed July 05, 2020 revealed mild nonobstructive disease and confirmed the presence of aortic stenosis with peak to peak and mean transvalvular gradients measured 44 and 34.6 mmHg respectively. CT angio revealed a very tortuous/aneurysmal abd aorta.   The patient has been evaluated by the multidisciplinary valve team and felt to have severe, symptomatic aortic stenosis and to be a suitable candidate for TAVR via the subclavian approach, which was set up for 08/14/20.   Hospital Course     Consultants: none  Severe AS: s/p successful TAVR with a 26 mm Edwards Sapien 3 Ultra THV via the left subclavian approach on 08/14/20. Post operative echo completed but pending formal read. Groin/subclavian sites are stable. ECG with sinus and no high grade heart block. Continue Asprin and started on Plavix 75 mg daily. Plan for discharge home today with close outpatient follow up next week.   DMT2: treated with SSI while admitted. Resume home meds at discharge. Okay to resume Metformin after 48 hours after contrast dye exposure (2/17 PM).  HTN: BP well controlled. Resumed on home meds.   COPD: on home QHS 02. She did have desaturations with ambulation that she says has been chronic. She refuses to use a portable home 02 machine.  _____________  Discharge Vitals Blood pressure (!) 109/41, pulse (!) 101, temperature 98.2 F (36.8 C),  temperature source Oral, resp. rate 18, height 5\' 4"  (1.626 m), weight 103.4 kg, SpO2 95 %.  Filed Weights   08/14/20 0602 08/15/20 0308 08/15/20 0536  Weight: 103 kg 105.8 kg 103.4 kg     GEN: Well nourished, well developed, in no acute distress HEENT: normal Neck: no JVD or masses Cardiac: RRR; very soft flow murmur. No rubs, or gallops,no edema  Respiratory:  clear to auscultation bilaterally, normal work of breathing GI: soft, nontender, nondistended, + BS MS: no deformity or atrophy Skin: warm and dry, no rash.  Groin/subclavian sites clear without hematoma or ecchymosis  Neuro:  Alert and Oriented x 3, Strength and sensation are intact Psych: euthymic mood, full affect  Labs & Radiologic Studies    CBC Recent Labs    08/14/20 1050 08/15/20 0057  WBC  --  6.8  HGB 12.6 11.3*  HCT 37.0 35.4*  MCV  --  90.8  PLT  --  448*   Basic Metabolic Panel Recent Labs    08/14/20 1050 08/15/20 0057  NA 141 139  K 3.9 3.8  CL 107 107  CO2  --  23  GLUCOSE 157* 121*  BUN 16 21  CREATININE 0.40* 0.70  CALCIUM  --  9.1  MG  --  1.9   Liver Function Tests No results for input(s): AST, ALT, ALKPHOS, BILITOT, PROT, ALBUMIN in the last 72 hours. No results for input(s): LIPASE, AMYLASE in the last 72 hours. Cardiac Enzymes No results for input(s): CKTOTAL, CKMB, CKMBINDEX, TROPONINI in the last 72 hours. BNP Invalid input(s): POCBNP D-Dimer No results for input(s): DDIMER in the last 72 hours. Hemoglobin A1C No results for input(s): HGBA1C in the last 72 hours. Fasting Lipid Panel No results for input(s): CHOL, HDL, LDLCALC, TRIG, CHOLHDL, LDLDIRECT in the last 72 hours. Thyroid Function Tests No results for input(s): TSH, T4TOTAL, T3FREE, THYROIDAB in the last 72 hours.  Invalid input(s): FREET3 _____________  DG Chest 2 View  Result Date: 08/10/2020 CLINICAL DATA:  Aortic stenosis.  Shortness of breath.  Preop. EXAM: CHEST - 2 VIEW COMPARISON:  Chest radiographs  12/20/2019 and CTA 07/20/2020 FINDINGS: The cardiomediastinal silhouette is unchanged with normal heart size. Aortic atherosclerosis is noted. There is emphysema with mild chronic interstitial coarsening. No acute airspace consolidation, edema, pleural effusion, pneumothorax is identified. Right mastectomy is noted. IMPRESSION: No evidence of acute cardiopulmonary process. Electronically Signed   By: Logan Bores M.D.   On: 08/10/2020 15:41   DG Chest Port 1 View  Result Date: 08/14/2020 CLINICAL DATA:  Post TAVR EXAM: PORTABLE CHEST 1 VIEW COMPARISON:  08/10/2020 FINDINGS: Changes of TAVR. Bibasilar atelectasis. Heart is borderline in size. No effusions or pneumothorax. No acute bony abnormality. Aortic atherosclerosis. IMPRESSION: Changes of TAVR. Bibasilar atelectasis. Electronically Signed   By: Rolm Baptise M.D.   On: 08/14/2020 13:16   CT ANGIO CHEST AORTA W/CM & OR WO/CM  Result Date: 07/20/2020 CLINICAL DATA:  75 year old female with history of severe aortic stenosis under preoperative evaluation prior to potential transcatheter aortic valve replacement (TAVR) procedure. EXAM: CT ANGIOGRAPHY CHEST, ABDOMEN AND PELVIS TECHNIQUE: Non-contrast CT of the chest was initially obtained. Multidetector CT imaging through the chest, abdomen and pelvis was performed using the standard protocol during bolus administration of intravenous contrast. Multiplanar reconstructed images and MIPs were obtained and reviewed to evaluate the vascular anatomy. CONTRAST:  46mL OMNIPAQUE IOHEXOL 350 MG/ML SOLN COMPARISON:  CTA of the chest, abdomen and pelvis 07/13/2020. FINDINGS: CTA CHEST FINDINGS Cardiovascular: Heart size is normal. There is no significant pericardial fluid, thickening or pericardial calcification. There is aortic atherosclerosis, as well as atherosclerosis of the great vessels of the mediastinum and the coronary arteries, including calcified atherosclerotic plaque in the left main, left anterior  descending, left circumflex and right coronary arteries. Thickening and calcification of the aortic valve. Mild to moderate calcifications of the mitral annulus. Mediastinum/Lymph Nodes: No pathologically enlarged mediastinal or hilar lymph nodes. Esophagus is unremarkable in appearance. No axillary lymphadenopathy. Numerous surgical clips are noted in the right axillary region. Lungs/Pleura: No suspicious appearing pulmonary nodules or masses are noted. No acute consolidative airspace disease. No pleural effusions. Diffuse bronchial wall thickening with mild centrilobular and paraseptal emphysema. Musculoskeletal/Soft Tissues: Status post right modified radical mastectomy and right axillary lymph node dissection. There are no aggressive  appearing lytic or blastic lesions noted in the visualized portions of the skeleton. CTA ABDOMEN AND PELVIS FINDINGS Hepatobiliary: Liver has a shrunken appearance and nodular contour, indicative of underlying cirrhosis. No discrete cystic or solid hepatic lesions. No intra or extrahepatic biliary ductal dilatation. Status post cholecystectomy. Pancreas: No pancreatic mass. No pancreatic ductal dilatation. No pancreatic or peripancreatic fluid collections or inflammatory changes. Spleen: Spleen is enlarged measuring 12.8 x 7.7 x 16.9 cm (estimated splenic volume of 833 mL) . Adrenals/Urinary Tract: Bilateral kidneys and adrenal glands are normal in appearance. No hydroureteronephrosis. Urinary bladder is normal in appearance. Stomach/Bowel: Normal appearance of the stomach. No pathologic dilatation of small bowel or colon. Numerous colonic diverticulae are noted, most evident in the sigmoid colon, without surrounding inflammatory changes to suggest an acute diverticulitis at this time. Normal appendix. Vascular/Lymphatic: Aortic atherosclerosis, with vascular findings and measurements pertinent to potential TAVR procedure, as detailed below. No lymphadenopathy noted in the abdomen  or pelvis. Reproductive: Uterus and ovaries are atrophic. Other: No significant volume of ascites.  No pneumoperitoneum. Musculoskeletal: Status post ORIF in the proximal right femur. There are no aggressive appearing lytic or blastic lesions noted in the visualized portions of the skeleton. VASCULAR MEASUREMENTS PERTINENT TO TAVR: AORTA: Minimal Aortic Diameter-14 x 13 mm Severity of Aortic Calcification-severe RIGHT PELVIS: Right Common Iliac Artery - Minimal Diameter-8.2 x 7.1 mm Tortuosity-mild Calcification-severe Right External Iliac Artery - Minimal Diameter-5.4 x 6.3 mm Tortuosity-moderate Calcification-moderate to severe Right Common Femoral Artery - Minimal Diameter-5.3 x 7.8 mm Tortuosity-mild Calcification-moderate LEFT PELVIS: Left Common Iliac Artery - Minimal Diameter-6.7 x 5.4 mm Tortuosity-mild Calcification-severe Left External Iliac Artery - Minimal Diameter-7.3 x 5.2 mm Tortuosity-moderate Calcification-moderate to severe Left Common Femoral Artery - Minimal Diameter-5.4 x 4.0 mm Tortuosity-mild Calcification-moderate Review of the MIP images confirms the above findings. IMPRESSION: 1. Vascular findings and measurements pertinent to potential TAVR procedure, as detailed above. 2. Severe thickening calcification of the aortic valve, compatible with reported clinical history of severe aortic stenosis. 3. Cirrhosis and splenomegaly. 4. Colonic diverticulosis without evidence of acute diverticulitis at this time. 5. Additional incidental findings, as above, similar to the prior study. Electronically Signed   By: Vinnie Langton M.D.   On: 07/20/2020 14:22   ECHO TEE  Result Date: 08/14/2020    TRANSESOPHOGEAL ECHO REPORT   Patient Name:   Shannon Schultz Date of Exam: 08/14/2020 Medical Rec #:  [id number]     Height:       64.0 in Accession #:    [id number]    Weight:       227.0 lb Date of Birth:  [birth year]    BSA:          2.065 m Patient Age:    55 years      BP:           119/77 mmHg Patient  Gender: F             HR:           88 bpm. Exam Location:  Inpatient Procedure: TEE-Intraopertive Indications:     I35.0 Nonrheumatic aortic (valve) stenosis  History:         Patient has prior history of Echocardiogram examinations, most                  recent 06/19/2020. CHF, COPD and OSA; Aortic Valve Disease.                  Aortic Valve: 26  mm Edwards Sapien prosthetic, stented (TAVR)                  valve is present in the aortic position. Procedure Date:                  08/14/2020.  Sonographer:     Raquel Sarna Senior Referring Phys:  [place] Diagnosing Phys: Sanda Klein MD PROCEDURE: The patient was intubated. The transesophogeal probe was passed without difficulty through the esophogus of the patient. Imaged were obtained with the patient in a supine position. Sedation performed by different physician. The patient developed no complications during the procedure. PRE-PROCEDURE FINDINGS Hyperdynamic left ventricular systolic function, estimated EF 60-65%. Trileaflet aortic valve. Scalcific aortic stenosis. No aortic insufficiency. Peak aortic gradient 61 mm Hg, mean gradient 41 mm Hg, dimensionless index 0.22, calculated valve area 0.8 cm (0.4 cm/m indexed for BSA). Acceleration time 109 ms. Mild mitral insufficiency. No pericardial effusion. POST-PROCEDURE FINDINGS Hyperdynamic left ventricular systolic function, estimated EF >75%. Well seated 26 mm Edwards Sapien3 stent valve (TAVR). No aortic insufficiency/perivalvular leak. Peak aortic gradient 8 mm Hg, mean gradient 5 mm Hg, dimensionless index 0.81, calculated valve area 3.84 cm. Acceleration time 69 ms. Mild mitral insufficiency. No pericardial effusion. IMPRESSIONS  1. Left ventricular ejection fraction, by estimation, is 60 to 65%. The left ventricle has normal function. The left ventricle has no regional wall motion abnormalities. There is mild concentric left ventricular hypertrophy. Left ventricular diastolic function  could not be evaluated.  2. Right ventricular systolic function is normal. The right ventricular size is normal.  3. Left atrial size was mildly dilated. No left atrial/left atrial appendage thrombus was detected.  4. The mitral valve is normal in structure. Mild mitral valve regurgitation.  5. The aortic valve has been repaired/replaced. Aortic valve regurgitation is not visualized. There is a 26 mm Edwards Sapien prosthetic (TAVR) valve present in the aortic position. Procedure Date: 08/14/2020. Echo findings are consistent with normal structure and function of the aortic valve prosthesis.  6. There is Moderate (Grade III) protruding plaque involving the transverse and descending aorta. FINDINGS  Left Ventricle: Left ventricular ejection fraction, by estimation, is 60 to 65%. The left ventricle has normal function. The left ventricle has no regional wall motion abnormalities. The left ventricular internal cavity size was normal in size. There is  mild concentric left ventricular hypertrophy. Left ventricular diastolic function could not be evaluated. Right Ventricle: The right ventricular size is normal. No increase in right ventricular wall thickness. Right ventricular systolic function is normal. Left Atrium: Left atrial size was mildly dilated. No left atrial/left atrial appendage thrombus was detected. Right Atrium: Right atrial size was normal in size. Pericardium: There is no evidence of pericardial effusion. Mitral Valve: The mitral valve is normal in structure. Mild mitral valve regurgitation. Tricuspid Valve: The tricuspid valve is normal in structure. Tricuspid valve regurgitation is mild. Aortic Valve: The aortic valve has been repaired/replaced. Aortic valve regurgitation is not visualized. Aortic valve mean gradient measures 5.0 mmHg. Aortic valve peak gradient measures 8.5 mmHg. Aortic valve area, by VTI measures 3.84 cm. There is a 26 mm Edwards Sapien prosthetic, stented (TAVR) valve present in  the aortic position. Procedure Date: 08/14/2020. Echo findings are consistent with normal structure and function of the aortic valve prosthesis. Pulmonic Valve: The pulmonic valve was grossly normal. Pulmonic valve regurgitation is not visualized. Aorta: The aortic root, ascending aorta and aortic arch are all structurally normal, with no evidence  of dilitation or obstruction. There is moderate (Grade III) protruding plaque involving the transverse and descending aorta. IAS/Shunts: No atrial level shunt detected by color flow Doppler.  LEFT VENTRICLE PLAX 2D LVOT diam:     2.20 cm LV SV:         78 LV SV Index:   38 LVOT Area:     3.80 cm  AORTIC VALVE AV Area (Vmax):    3.09 cm AV Area (Vmean):   2.66 cm AV Area (VTI):     3.84 cm AV Vmax:           146.00 cm/s AV Vmean:          104.000 cm/s AV VTI:            0.204 m AV Peak Grad:      8.5 mmHg AV Mean Grad:      5.0 mmHg LVOT Vmax:         118.72 cm/s LVOT Vmean:        72.872 cm/s LVOT VTI:          0.206 m LVOT/AV VTI ratio: 1.01  SHUNTS Systemic VTI:  0.21 m Systemic Diam: 2.20 cm Dani Gobble Croitoru MD Electronically signed by Sanda Klein MD Signature Date/Time: 08/14/2020/10:50:20 AM    Final (Updated)    Structural Heart Procedure  Result Date: 08/14/2020 See surgical note for result.  CT Angio Abd/Pel w/ and/or w/o  Result Date: 07/20/2020 CLINICAL DATA:  75 year old female with history of severe aortic stenosis under preoperative evaluation prior to potential transcatheter aortic valve replacement (TAVR) procedure. EXAM: CT ANGIOGRAPHY CHEST, ABDOMEN AND PELVIS TECHNIQUE: Non-contrast CT of the chest was initially obtained. Multidetector CT imaging through the chest, abdomen and pelvis was performed using the standard protocol during bolus administration of intravenous contrast. Multiplanar reconstructed images and MIPs were obtained and reviewed to evaluate the vascular anatomy. CONTRAST:  44mL OMNIPAQUE IOHEXOL 350 MG/ML SOLN COMPARISON:  CTA  of the chest, abdomen and pelvis 07/13/2020. FINDINGS: CTA CHEST FINDINGS Cardiovascular: Heart size is normal. There is no significant pericardial fluid, thickening or pericardial calcification. There is aortic atherosclerosis, as well as atherosclerosis of the great vessels of the mediastinum and the coronary arteries, including calcified atherosclerotic plaque in the left main, left anterior descending, left circumflex and right coronary arteries. Thickening and calcification of the aortic valve. Mild to moderate calcifications of the mitral annulus. Mediastinum/Lymph Nodes: No pathologically enlarged mediastinal or hilar lymph nodes. Esophagus is unremarkable in appearance. No axillary lymphadenopathy. Numerous surgical clips are noted in the right axillary region. Lungs/Pleura: No suspicious appearing pulmonary nodules or masses are noted. No acute consolidative airspace disease. No pleural effusions. Diffuse bronchial wall thickening with mild centrilobular and paraseptal emphysema. Musculoskeletal/Soft Tissues: Status post right modified radical mastectomy and right axillary lymph node dissection. There are no aggressive appearing lytic or blastic lesions noted in the visualized portions of the skeleton. CTA ABDOMEN AND PELVIS FINDINGS Hepatobiliary: Liver has a shrunken appearance and nodular contour, indicative of underlying cirrhosis. No discrete cystic or solid hepatic lesions. No intra or extrahepatic biliary ductal dilatation. Status post cholecystectomy. Pancreas: No pancreatic mass. No pancreatic ductal dilatation. No pancreatic or peripancreatic fluid collections or inflammatory changes. Spleen: Spleen is enlarged measuring 12.8 x 7.7 x 16.9 cm (estimated splenic volume of 833 mL) . Adrenals/Urinary Tract: Bilateral kidneys and adrenal glands are normal in appearance. No hydroureteronephrosis. Urinary bladder is normal in appearance. Stomach/Bowel: Normal appearance of the stomach. No pathologic  dilatation of small  bowel or colon. Numerous colonic diverticulae are noted, most evident in the sigmoid colon, without surrounding inflammatory changes to suggest an acute diverticulitis at this time. Normal appendix. Vascular/Lymphatic: Aortic atherosclerosis, with vascular findings and measurements pertinent to potential TAVR procedure, as detailed below. No lymphadenopathy noted in the abdomen or pelvis. Reproductive: Uterus and ovaries are atrophic. Other: No significant volume of ascites.  No pneumoperitoneum. Musculoskeletal: Status post ORIF in the proximal right femur. There are no aggressive appearing lytic or blastic lesions noted in the visualized portions of the skeleton. VASCULAR MEASUREMENTS PERTINENT TO TAVR: AORTA: Minimal Aortic Diameter-14 x 13 mm Severity of Aortic Calcification-severe RIGHT PELVIS: Right Common Iliac Artery - Minimal Diameter-8.2 x 7.1 mm Tortuosity-mild Calcification-severe Right External Iliac Artery - Minimal Diameter-5.4 x 6.3 mm Tortuosity-moderate Calcification-moderate to severe Right Common Femoral Artery - Minimal Diameter-5.3 x 7.8 mm Tortuosity-mild Calcification-moderate LEFT PELVIS: Left Common Iliac Artery - Minimal Diameter-6.7 x 5.4 mm Tortuosity-mild Calcification-severe Left External Iliac Artery - Minimal Diameter-7.3 x 5.2 mm Tortuosity-moderate Calcification-moderate to severe Left Common Femoral Artery - Minimal Diameter-5.4 x 4.0 mm Tortuosity-mild Calcification-moderate Review of the MIP images confirms the above findings. IMPRESSION: 1. Vascular findings and measurements pertinent to potential TAVR procedure, as detailed above. 2. Severe thickening calcification of the aortic valve, compatible with reported clinical history of severe aortic stenosis. 3. Cirrhosis and splenomegaly. 4. Colonic diverticulosis without evidence of acute diverticulitis at this time. 5. Additional incidental findings, as above, similar to the prior study. Electronically Signed    By: Vinnie Langton M.D.   On: 07/20/2020 14:22   Disposition   Pt is being discharged home today in good condition.  Follow-up Plans & Appointments     Follow-up Information    [name], PA-C. Go on 08/22/2020.   Specialties: Cardiology, Radiology Why: @ 1:30pm, please arrive at least 10 minutes early.  Contact information: [place] [id number] [phone]              Discharge Instructions    Amb Referral to Cardiac Rehabilitation   Complete by: As directed    Diagnosis: Valve Replacement   Valve: Aortic Comment - TAVR   After initial evaluation and assessments completed: Virtual Based Care may be provided alone or in conjunction with Phase 2 Cardiac Rehab based on patient barriers.: Yes      Discharge Medications   Allergies as of 08/15/2020      Reactions   Contrast Media [iodinated Diagnostic Agents] Anaphylaxis, Shortness Of Breath, Other (See Comments)   Could not breath   Iohexol Anaphylaxis, Shortness Of Breath, Other (See Comments)   Immediately could not breathe   Lisinopril Anaphylaxis, Shortness Of Breath, Rash   Sulfa Antibiotics Anaphylaxis, Other (See Comments)   Historical from mother, pt states that mother says she almost died from this drug   Latex Other (See Comments)   Unless against on skin for a long time, blisters. Short term is okay.   Nickel Other (See Comments)   With earrings pt has soreness and drainage from piercing   Codeine Nausea Only, Anxiety, Other (See Comments)   insomnia   Levofloxacin Other (See Comments)   insomnia   Lipitor [atorvastatin Calcium] Rash   Vicodin [hydrocodone-acetaminophen] Itching      Medication List    STOP taking these medications   diphenhydrAMINE 50 MG tablet Commonly known as: BENADRYL   predniSONE 50 MG tablet Commonly known as: DELTASONE     TAKE these medications  Accu-Chek Aviva Plus test strip Generic drug: glucose blood TEST BLOOD SUGAR  ONE TO TWO TIMES DAILY   Accu-Chek Softclix Lancets lancets Test BS 1-2 times daily. Pt uses accu-chek meter   TRUEplus Lancets 30G Misc TEST BLOOD SUGAR ONE TO TWO TIMES DAILY   acetaminophen 500 MG tablet Commonly known as: TYLENOL Take 1,000 mg by mouth every 6 (six) hours as needed for moderate pain or headache.   albuterol 108 (90 Base) MCG/ACT inhaler Commonly known as: VENTOLIN HFA Inhale 2 puffs into the lungs every 6 (six) hours as needed for wheezing or shortness of breath. What changed: Another medication with the same name was changed. Make sure you understand how and when to take each.   albuterol (2.5 MG/3ML) 0.083% nebulizer solution Commonly known as: PROVENTIL USE 1 VIAL BY NEBULIZATION EVERY FOUR HOURS AS NEEDED FOR WHEEZING OR SHORTNESS OF BREATH. What changed:   how much to take  how to take this  when to take this  reasons to take this  additional instructions   Anoro Ellipta 62.5-25 MCG/INH Aepb Generic drug: umeclidinium-vilanterol Inhale 1 puff into the lungs daily. What changed: how much to take   aspirin EC 81 MG tablet Take 81 mg daily by mouth.   B-D SINGLE USE SWABS REGULAR Pads Use twice a day when checking blood sugars   bisoprolol-hydrochlorothiazide 10-6.25 MG tablet Commonly known as: ZIAC Take 1 tablet by mouth daily.   CAL-MAG-ZINC PO Take 1 tablet by mouth daily.   CALCIUM-MAGNESIUM-VITAMIN D PO Take 1 tablet by mouth daily.   cetirizine 10 MG tablet Commonly known as: ZYRTEC Take 10 mg by mouth daily.   clopidogrel 75 MG tablet Commonly known as: PLAVIX Take 1 tablet (75 mg total) by mouth daily with breakfast. Start taking on: August 16, 2020   fluticasone 50 MCG/ACT nasal spray Commonly known as: FLONASE Place 2 sprays into both nostrils daily. What changed: when to take this   furosemide 20 MG tablet Commonly known as: LASIX TAKE 1 TABLET (20 MG TOTAL) BY MOUTH DAILY AS NEEDED FOR FLUID OR EDEMA.    guaiFENesin 600 MG 12 hr tablet Commonly known as: MUCINEX Take 600 mg by mouth 2 (two) times daily.   metFORMIN 500 MG 24 hr tablet Commonly known as: GLUCOPHAGE-XR Take 1 tablet (500 mg total) by mouth 2 (two) times daily before a meal.   montelukast 10 MG tablet Commonly known as: SINGULAIR Take 1 tablet (10 mg total) by mouth at bedtime.   simvastatin 20 MG tablet Commonly known as: ZOCOR Take 1 tablet (20 mg total) by mouth at bedtime.   triamcinolone 0.1 % Commonly known as: KENALOG Apply 1 application topically 3 (three) times a week.   valACYclovir 500 MG tablet Commonly known as: Valtrex Take 1 pill by mouth twice daily for 3 days; start at earliest onset of herpes flare What changed:   how much to take  how to take this  when to take this  additional instructions         Outstanding Labs/Studies   none  Duration of Discharge Encounter   Greater than 30 minutes including physician time.  SignedAngelena Form, PA-C 08/15/2020, 10:25 AM [phone]

## 2020-08-15 NOTE — Progress Notes (Signed)
  Echocardiogram 2D Echocardiogram has been performed.  Shannon Schultz 08/15/2020, 10:02 AM

## 2020-08-16 ENCOUNTER — Other Ambulatory Visit: Payer: Self-pay | Admitting: Physician Assistant

## 2020-08-16 ENCOUNTER — Telehealth (HOSPITAL_COMMUNITY): Payer: Self-pay

## 2020-08-16 ENCOUNTER — Telehealth: Payer: Self-pay | Admitting: Physician Assistant

## 2020-08-16 DIAGNOSIS — Z952 Presence of prosthetic heart valve: Secondary | ICD-10-CM

## 2020-08-16 MED FILL — Magnesium Sulfate Inj 50%: INTRAMUSCULAR | Qty: 10 | Status: AC

## 2020-08-16 MED FILL — Heparin Sodium (Porcine) Inj 1000 Unit/ML: INTRAMUSCULAR | Qty: 30 | Status: AC

## 2020-08-16 MED FILL — Potassium Chloride Inj 2 mEq/ML: INTRAVENOUS | Qty: 40 | Status: AC

## 2020-08-16 NOTE — Telephone Encounter (Signed)
Pt insurance is active and benefits verified through [employer] Medicare Co-pay $10, DED 0/0 met, out of pocket $3,900/$630 met, co-insurance 0%. no pre-authorization required. Passport, Amy/[employer] 08/16/2020@10:37am, REF# [id number]  Will contact patient to see if she is interested in the Cardiac Rehab Program. If interested, patient will need to complete follow up appt. Once completed, patient will be contacted for scheduling upon review by the RN Navigator. 

## 2020-08-16 NOTE — Telephone Encounter (Signed)
Called patient to see if she is interested in the Cardiac Rehab Program. Patient expressed interest. Explained scheduling process and went over insurance, patient verbalized understanding. Will contact patient for scheduling once f/u has been completed. 

## 2020-08-16 NOTE — Telephone Encounter (Addendum)
  [facility] VALVE TEAM   Patient contacted regarding discharge from [facility] on 2/16  Patient understands to follow up with provider Nell Range on 2/23 at [facility].  Patient understands discharge instructions? yes Patient understands medications and regimen? yes Patient understands to bring all medications to this visit? Yes  Pt noticed HR was elevated ~116 bpm, while sitting at rest last night. Started back on her bisoprolol today. Will watch over next 24 hours and consider a monitor if this persists.   Angelena Form PA-C  [facility]

## 2020-08-21 NOTE — Progress Notes (Unsigned)
[facility] [place]                                     Cardiology Office Note:    Date:  08/22/2020   ID:  Shannon Schultz, DOB [birth year], MRN [id number]  PCP:  [name], MD  [facility] Cardiologist:  Candee Furbish, MD / Dr. Angelena Form & Dr. Roxy Manns (TAVR) [facility] Electrophysiologist:  None   Referring MD: [name], MD   [facility] s/p TAVR  History of Present Illness:    Shannon Schultz is a 75 y.o. female with a hx of breast cancer, bladder cancer, HTN, HLD, COPD with nocturnal home oxygen therapy and obstructive sleep apnea , DMT2, morbid obesity, NAFLD, AAA and severe AS s/p TAVR (08/14/20) who presents to clinic for follow up.  Patient reports a long history of chronic exertional shortness of breath that until recently has been largely attributed to COPD. She quit smoking in 2012 and has been followed by a pulmonologist. Spirometry has revealed evidence of mild to moderate obstruction that has remained stable. She has been treated with long term inhalers and overall done fairly well. Approximately 1 year ago she fell and broke her hip. Her recovery has been quite slow in her mobility fairly limited. She continues to describe stable symptoms of exertional shortness of breath that occur with moderate to low level activity. Patient states she was first told she had a heart murmur less than 10 years ago andshe has been followed for the last several years by Dr. Angelina Ok known history of aortic stenosis.Recent follow-up echocardiogram revealed significant progression and severity of aortic stenosis with preserved left ventricular function. The aortic valve appeared trileaflet with severe thickening, calcification, and restricted leaflet mobility. Peak velocity across aortic valve measured 3.8 m/s corresponding to mean transvalvular gradient estimated 38 mmHg and aortic valve area calculated only 0.70 cm by VTI. DVI was reported 0.22 with  stroke-volume index 34. Left ventricular systolic function remain normal with ejection fraction estimated 65 to 70%. The patient was referred to the multidisciplinary heart valve clinic. Diagnostic cardiac catheterization performed July 05, 2020 revealed mild nonobstructive disease and confirmed the presence of aortic stenosis with peak to peak and mean transvalvular gradientsmeasured 44 and 34.6 mmHg respectively.CT angio revealed a very tortuous/aneurysmal abd aorta.   She was evaluated by the multidisciplinary valve team and underwent successful TAVR with a 26 mm Edwards Sapien 3 Ultra THV via the left subclavian approach on 08/14/20. Post operative echo showed EF 65%, normally functioning TAVR with a mean gradient of 8.7 mmHg and no PVL. She was continued on her home aspirin and Plavix 60m daily was added at discharge.   Today she presents to clinic for follow up. Here with husband. Cannot tell a big difference in the way she breaths. No CP. Still has some SOB with walking. she has chronic stable LE edema but no orthopnea or PND. No dizziness or syncope. No blood in stool or urine. No palpitations. Has a little bump on her left arm that is painful. Has had it since admission. Wants to start exercising and dieting now that valve is replaced.    Past Medical History:  Diagnosis Date  . Arthritis    "hands" (05/15/2017)  . Bladder cancer ([facility] 2018  . [place], right [place] 1992   DCIS,bladder ca (just dx)  . Colon polyp   .  Complication of anesthesia 1992   "local anesthesia" used was hard to awaken from-no problems since (05/15/2017)  . COPD (chronic obstructive pulmonary disease) ([place])   . Coronary artery disease   . COVID-19 virus infection 05/05/2019  . Diverticulosis   . Elevated liver enzymes    fatty liver per ultrasound per pt  . FHx: BRCA2 gene positive    sister with BRCA2 mutation (pt tested NEGATIVE)  . HLD (hyperlipidemia)   . HSV (herpes simplex virus) infection     on hip--on daily suppression  . Hypertension   . Hypothyroidism    took med 7 yrs after birth of 1st child  . Impaired glucose tolerance   . Migraine   . On home oxygen therapy    "have it available but I'm not using it" (05/15/2017)  . Osteopenia   . S/P TAVR (transcatheter aortic valve replacement) 08/14/2020   s/p TAVR with a 26 mm Edwards S3U via the left subclavian approach by Dr. Roxy Manns and Dr. Angelena Form  . Severe aortic stenosis   . Sleep apnea    Sleeps with 2 L O2 @ night  . Type 2 diabetes mellitus ([place])   . Ventral hernia   . Vitamin D deficiency disease     Past Surgical History:  Procedure Laterality Date  . ABDOMINAL HERNIA REPAIR  05/15/2017  . BREAST BIOPSY Right 1992  . BREAST BIOPSY Left 2018  . BREAST EXCISIONAL BIOPSY Left 2018   ATYPICAL DUCTAL HYPERPLASIA INVOLVING A COMPLEX  . BREAST LUMPECTOMY WITH RADIOACTIVE SEED LOCALIZATION Left 12/22/2016   Procedure: LEFT BREAST LUMPECTOMY WITH RADIOACTIVE SEED LOCALIZATION;  Surgeon: Jovita Kussmaul, MD;  Location: [facility];  Service: General;  Laterality: Left;  . CARDIAC CATHETERIZATION    . CATARACT EXTRACTION, BILATERAL Bilateral R 03/16/2019 L 03/30/2019   Dr. Kathlen Mody  . COLONOSCOPY  2009, 08/2010, 12/2015   Dr. Collene Mares; "only 1 had any polyps" (05/15/2017)  . CYSTOSCOPY  04/23/2017  . CYSTOSCOPY W/ RETROGRADES Bilateral 01/12/2017   Procedure: CYSTOSCOPY WITH RETROGRADE PYELOGRAM/ EXAM UNDER ANESTHESIA;  Surgeon: Raynelle Bring, MD;  Location[facility];  Service: Urology;  Laterality: Bilateral;  . EYE SURGERY    . FEMUR IM NAIL Right 08/24/2019   Procedure: INTRAMEDULLARY (IM) NAIL FEMORAL;  Surgeon: Rod Can, MD;  Location: [facility];  Service: Orthopedics;  Laterality: Right;  . HERNIA REPAIR    . INSERTION OF MESH N/A 05/15/2017   Procedure: INSERTION OF MESH;  Surgeon: Jovita Kussmaul, MD;  Location: [facility];  Service: General;  Laterality: N/A;  . LAPAROSCOPIC CHOLECYSTECTOMY  2003  . MASTECTOMY Right 1992  .  RIGHT/LEFT HEART CATH AND CORONARY ANGIOGRAPHY N/A 07/05/2020   Procedure: RIGHT/LEFT HEART CATH AND CORONARY ANGIOGRAPHY;  Surgeon: Burnell Blanks, MD;  Location: [facility];  Service: Cardiovascular;  Laterality: N/A;  . TEE WITHOUT CARDIOVERSION N/A 08/14/2020   Procedure: TRANSESOPHAGEAL ECHOCARDIOGRAM (TEE);  Surgeon: Burnell Blanks, MD;  Location: [facility];  Service: Open Heart Surgery;  Laterality: N/A;  . TRANSURETHRAL RESECTION OF BLADDER TUMOR WITH MITOMYCIN-C N/A 01/12/2017   Procedure: TRANSURETHRAL RESECTION OF BLADDER TUMOR WITH POSSIBLE POST OPERATIVE INSTILLATION OF MITOMYCIN-C;  Surgeon: Raynelle Bring, MD;  [facility];  Service: Urology;  Laterality: N/A;  . TYMPANOSTOMY TUBE PLACEMENT Bilateral 1980s  . UMBILICAL HERNIA REPAIR  2003  . VENTRAL HERNIA REPAIR N/A 05/15/2017   Procedure: VENTRAL HERNIA REPAIR WITH MESH;  Surgeon: Jovita Kussmaul, MD;  Location: [facility];  Service: General;  Laterality: N/A;    Current Medications: Current Meds  Medication Sig  . ACCU-CHEK AVIVA PLUS test strip TEST BLOOD SUGAR ONE TO TWO TIMES DAILY  . Accu-Chek Softclix Lancets lancets Test BS 1-2 times daily. Pt uses accu-chek meter  . acetaminophen (TYLENOL) 500 MG tablet Take 1,000 mg by mouth every 6 (six) hours as needed for moderate pain or headache.  . albuterol (PROVENTIL) (2.5 MG/3ML) 0.083% nebulizer solution USE 1 VIAL BY NEBULIZATION EVERY FOUR HOURS AS NEEDED FOR WHEEZING OR SHORTNESS OF BREATH.  Marland Kitchen albuterol (VENTOLIN HFA) 108 (90 Base) MCG/ACT inhaler Inhale 2 puffs into the lungs every 6 (six) hours as needed for wheezing or shortness of breath.  . Alcohol Swabs (B-D SINGLE USE SWABS REGULAR) PADS Use twice a day when checking blood sugars  . amoxicillin (AMOXIL) 500 MG capsule Take 4 tablets (2,049m total) by mouth 1 hour prior to dental work  . aspirin EC 81 MG tablet Take 81 mg daily by mouth.  . bisoprolol-hydrochlorothiazide (ZIAC) 10-6.25 MG tablet  Take 1 tablet by mouth daily.  .Marland KitchenCALCIUM-MAGNESIUM-VITAMIN D PO Take 1 tablet by mouth daily.  . Calcium-Magnesium-Zinc (CAL-MAG-ZINC PO) Take 1 tablet by mouth daily.  . cetirizine (ZYRTEC) 10 MG tablet Take 10 mg by mouth daily.  . clopidogrel (PLAVIX) 75 MG tablet Take 1 tablet (75 mg total) by mouth daily with breakfast.  . fluticasone (FLONASE) 50 MCG/ACT nasal spray Place 2 sprays into both nostrils daily.  . furosemide (LASIX) 20 MG tablet TAKE 1 TABLET (20 MG TOTAL) BY MOUTH DAILY AS NEEDED FOR FLUID OR EDEMA.  .Marland KitchenguaiFENesin (MUCINEX) 600 MG 12 hr tablet Take 600 mg by mouth 2 (two) times daily.  . metFORMIN (GLUCOPHAGE-XR) 500 MG 24 hr tablet Take 1 tablet (500 mg total) by mouth 2 (two) times daily before a meal.  . montelukast (SINGULAIR) 10 MG tablet Take 1 tablet (10 mg total) by mouth at bedtime.  . simvastatin (ZOCOR) 20 MG tablet Take 1 tablet (20 mg total) by mouth at bedtime.  . triamcinolone cream (KENALOG) 0.1 % Apply 1 application topically as needed.  . TRUEplus Lancets 30G MISC TEST BLOOD SUGAR ONE TO TWO TIMES DAILY  . umeclidinium-vilanterol (ANORO ELLIPTA) 62.5-25 MCG/INH AEPB Inhale 1 puff into the lungs daily.  . valACYclovir (VALTREX) 500 MG tablet Take 1 pill by mouth twice daily for 3 days; start at earliest onset of herpes flare (Patient taking differently: Take 500 mg by mouth daily.)     Allergies:   Contrast media [iodinated diagnostic agents], Iohexol, Lisinopril, Sulfa antibiotics, Latex, Nickel, Codeine, Levofloxacin, Lipitor [atorvastatin calcium], and Vicodin [hydrocodone-acetaminophen]   Social History   Socioeconomic History  . Marital status: Married    Spouse name: Not on file  . Number of children: 3  . Years of education: Not on file  . Highest education level: Not on file  Occupational History  . Occupation: Retired  Tobacco Use  . Smoking status: Former Smoker    Packs/day: 1.00    Years: 46.00    Pack years: 46.00    Types: Cigarettes     Quit date: 05/31/2011    Years since quitting: 9.2  . Smokeless tobacco: Never Used  Vaping Use  . Vaping Use: Never used  Substance and Sexual Activity  . Alcohol use: No  . Drug use: No  . Sexual activity: Yes    Partners: Male    Birth control/protection: Post-menopausal  Other Topics Concern  . Not on file  Social History Narrative   Lives with her husband.  Children all live in [place] nearby. No pets. 6 grandchildren.   Social Determinants of Health   Financial Resource Strain: Not on file  Food Insecurity: Not on file  Transportation Needs: Not on file  Physical Activity: Not on file  Stress: Not on file  Social Connections: Not on file     Family History: The patient's family history includes Allergies in her sister and sister; Asthma in her sister and sister; Breast cancer (age of onset: 42) in her sister; Cancer in her maternal grandmother and another family member; Diabetes in her father; Fibromyalgia in her sister and sister; Hashimoto's thyroiditis in her sister and sister; Heart disease in her father and mother; Hyperlipidemia in her sister and sister; Hypertension in her father; Stroke (age of onset: 27) in her paternal grandmother; Tuberculosis in her maternal grandfather.  ROS:   Please see the history of present illness.    All other systems reviewed and are negative.  EKGs/Labs/Other Studies Reviewed:    The following studies were reviewed today:  TAVR OPERATIVE NOTE   Date of Procedure:08/14/2020  Preoperative Diagnosis:Severe Aortic Stenosis   Postoperative Diagnosis:Same   Procedure:   Transcatheter Aortic Valve Replacement - Left Trans-Subclavian Approach Edwards Sapien 3 UltraTHV (size 72m, model # [id number] serial # [id number]  Co-Surgeons:Clarence H. ORoxy Manns MD and CLauree Chandler MD  Anesthesiologist:Adam HMarcie Bal  MD  Echocardiographer:Mihai Croitoru, MD  Pre-operative Echo Findings: ? Severe aortic stenosis ? Normalleft ventricular systolic function  Post-operative Echo Findings: ? Noparavalvular leak ? Normalleft ventricular systolic function  _____________   Echo 08/15/20: IMPRESSIONS  1. Left ventricular ejection fraction, by estimation, is 65 to 70%. The  left ventricle has hyperdynamic function. The left ventricle has no  regional wall motion abnormalities. There is mild concentric left  ventricular hypertrophy. Left ventricular  diastolic parameters are consistent with Grade I diastolic dysfunction  (impaired relaxation). Elevated left atrial pressure.  2. Right ventricular systolic function is normal. The right ventricular  size is normal.  3. Left atrial size was mildly dilated.  4. The mitral valve is normal in structure. Trivial mitral valve  regurgitation.  5. The aortic valve has been repaired/replaced. Aortic valve  regurgitation is not visualized. There is a 26 mm Edwards Sapien  prosthetic (TAVR) valve present in the aortic position. Procedure Date:  08/14/2020. Aortic valve mean gradient measures 8.7  mmHg. Aortic valve Vmax measures 2.09 m/s. Acceleration time 88 ms.  6. The inferior vena cava is normal in size with greater than 50%  respiratory variability, suggesting right atrial pressure of 3 mmHg.   EKG:  EKG is ordered today.  The ekg ordered today demonstrates sinus  Recent Labs: 04/23/2020: TSH 3.150 08/10/2020: ALT 23 08/15/2020: BUN 21; Creatinine, Ser 0.70; Hemoglobin 11.3; Magnesium 1.9; Platelets 117; Potassium 3.8; Sodium 139  Recent Lipid Panel    Component Value Date/Time   CHOL 116 10/03/2019 0850   TRIG 101 10/03/2019 0850   HDL 54 10/03/2019 0850   CHOLHDL 2.1 10/03/2019 0850   CHOLHDL 2.2 08/18/2016 1346   VLDL 19 08/18/2016 1346   LDLCALC 43 10/03/2019 0850     Risk Assessment/Calculations:       Physical  Exam:    VS:  BP 130/68   Pulse 78   Ht '5\' 4"'  (1.626 m)   Wt 228 lb (103.4 kg)   LMP  (LMP Unknown)   SpO2 97%   BMI 39.14 kg/m  Wt Readings from Last 3 Encounters:  08/22/20 228 lb (103.4 kg)  08/15/20 227 lb 15.3 oz (103.4 kg)  08/10/20 227 lb 6.4 oz (103.1 kg)     GEN:  Well nourished, well developed in no acute distress, obese HEENT: Normal NECK: No JVD; No carotid bruits LYMPHATICS: No lymphadenopathy CARDIAC: RRR, 2/6 SEM @ RUSB. No rubs, gallops. 1+ bilateral LE edema RESPIRATORY:  Clear to auscultation without rales, wheezing or rhonchi  ABDOMEN: Soft, non-tender, non-distended MUSCULOSKELETAL:  No edema; No deformity  SKIN: Warm and dry.  Groin/subaclvian sites clear without hematoma. There is mild ecchymosis down right leg. Left arm has a small, tender bump NEUROLOGIC:  Alert and oriented x 3 PSYCHIATRIC:  Normal affect   ASSESSMENT:    1. S/P TAVR (transcatheter aortic valve replacement)   2. Essential hypertension, benign   3. COPD, group B, by GOLD 2017 classification ([place])   4. Phlebitis    PLAN:    In order of problems listed above:  Severe AS s/p TAVR:doing well. Groin site/subclavian site healing well. ECG with no HAVB. Continue aspirin and plavix. SBE prophylaxis discussed; I have RX'd amoxicillin.  I will see her back for 1 month follow up with echo.   HTN: BP well controlled. No changes made.   COPD: on home QHS 02.    Phlebitis: supportive care. Offered diclofenac gel but pt declined.    Medication Adjustments/Labs and Tests Ordered: Current medicines are reviewed at length with the patient today.  Concerns regarding medicines are outlined above.  Orders Placed This Encounter  Procedures  . EKG 12-Lead   Meds ordered this encounter  Medications  . amoxicillin (AMOXIL) 500 MG capsule    Sig: Take 4 tablets (2,035m total) by mouth 1 hour prior to dental work    Dispense:  4 capsule    Refill:  3    Patient Instructions   Medication Instructions:  1) TAKE Amoxicillin 2g (4 tablets at 5049meach) 1 hour prior to dental work.  *If you need a refill on your cardiac medications before your next appointment, please call your pharmacy*   Lab Work: None If you have labs (blood work) drawn today and your tests are completely normal, you will receive your results only by: . Marland KitchenyChart Message (if you have MyChart) OR . A paper copy in the mail If you have any lab test that is abnormal or we need to change your treatment, we will call you to review the results.   Testing/Procedures: None   Follow-Up: Keep current appointments scheduled for March.    Other Instructions      Signed, KaAngelena FormPA-C  08/22/2020 3:19 PM    [facility]

## 2020-08-22 ENCOUNTER — Encounter: Payer: Self-pay | Admitting: Physician Assistant

## 2020-08-22 ENCOUNTER — Ambulatory Visit: Payer: Medicare HMO | Admitting: Physician Assistant

## 2020-08-22 ENCOUNTER — Other Ambulatory Visit: Payer: Self-pay

## 2020-08-22 VITALS — BP 130/68 | HR 78 | Ht 64.0 in | Wt 228.0 lb

## 2020-08-22 DIAGNOSIS — J449 Chronic obstructive pulmonary disease, unspecified: Secondary | ICD-10-CM | POA: Diagnosis not present

## 2020-08-22 DIAGNOSIS — I10 Essential (primary) hypertension: Secondary | ICD-10-CM | POA: Diagnosis not present

## 2020-08-22 DIAGNOSIS — I809 Phlebitis and thrombophlebitis of unspecified site: Secondary | ICD-10-CM | POA: Diagnosis not present

## 2020-08-22 DIAGNOSIS — Z952 Presence of prosthetic heart valve: Secondary | ICD-10-CM

## 2020-08-22 MED ORDER — AMOXICILLIN 500 MG PO CAPS: ORAL_CAPSULE | ORAL | 3 refills | Status: DC

## 2020-08-22 NOTE — Patient Instructions (Signed)
Medication Instructions:  1) TAKE Amoxicillin 2g (4 tablets at 500mg  each) 1 hour prior to dental work.  *If you need a refill on your cardiac medications before your next appointment, please call your pharmacy*   Lab Work: None If you have labs (blood work) drawn today and your tests are completely normal, you will receive your results only by: Marland Kitchen MyChart Message (if you have MyChart) OR . A paper copy in the mail If you have any lab test that is abnormal or we need to change your treatment, we will call you to review the results.   Testing/Procedures: None   Follow-Up: Keep current appointments scheduled for March.    Other Instructions

## 2020-08-27 ENCOUNTER — Encounter: Payer: Self-pay | Admitting: Family Medicine

## 2020-08-27 ENCOUNTER — Other Ambulatory Visit: Payer: Self-pay | Admitting: Family Medicine

## 2020-08-27 DIAGNOSIS — B009 Herpesviral infection, unspecified: Secondary | ICD-10-CM

## 2020-08-27 DIAGNOSIS — E118 Type 2 diabetes mellitus with unspecified complications: Secondary | ICD-10-CM

## 2020-08-28 NOTE — Telephone Encounter (Signed)
Pt requesting a refill on her valtrex. Please advise [facility]

## 2020-09-12 ENCOUNTER — Other Ambulatory Visit: Payer: Self-pay

## 2020-09-12 ENCOUNTER — Encounter: Payer: Self-pay | Admitting: Physician Assistant

## 2020-09-12 ENCOUNTER — Encounter (HOSPITAL_COMMUNITY): Payer: Self-pay

## 2020-09-12 ENCOUNTER — Ambulatory Visit (INDEPENDENT_AMBULATORY_CARE_PROVIDER_SITE_OTHER): Payer: Medicare HMO

## 2020-09-12 ENCOUNTER — Encounter: Payer: Self-pay | Admitting: Radiology

## 2020-09-12 ENCOUNTER — Ambulatory Visit (HOSPITAL_COMMUNITY): Payer: Medicare HMO | Attending: Cardiology

## 2020-09-12 ENCOUNTER — Ambulatory Visit: Payer: Medicare HMO | Admitting: Physician Assistant

## 2020-09-12 ENCOUNTER — Telehealth (HOSPITAL_COMMUNITY): Payer: Self-pay

## 2020-09-12 VITALS — BP 140/74 | HR 80 | Ht 64.0 in | Wt 223.4 lb

## 2020-09-12 DIAGNOSIS — J449 Chronic obstructive pulmonary disease, unspecified: Secondary | ICD-10-CM | POA: Diagnosis not present

## 2020-09-12 DIAGNOSIS — I10 Essential (primary) hypertension: Secondary | ICD-10-CM | POA: Diagnosis not present

## 2020-09-12 DIAGNOSIS — R002 Palpitations: Secondary | ICD-10-CM

## 2020-09-12 DIAGNOSIS — Z952 Presence of prosthetic heart valve: Secondary | ICD-10-CM

## 2020-09-12 LAB — ECHOCARDIOGRAM COMPLETE
AR max vel: 1.25 cm2
AV Area VTI: 1.43 cm2
AV Area mean vel: 1.24 cm2
AV Mean grad: 12.4 mmHg
AV Peak grad: 25.3 mmHg
Ao pk vel: 2.52 m/s
Area-P 1/2: 2.47 cm2
MV VTI: 1.94 cm2
S' Lateral: 2.4 cm

## 2020-09-12 NOTE — Patient Instructions (Signed)
Medication Instructions:  1) You may STOP PLAVIX 02/11/2021 *If you need a refill on your cardiac medications before your next appointment, please call your pharmacy*  Testing/Procedures: Joellen Jersey recommends you wear a HEART MONITOR for 14 days. This will be mailed to your home.  Follow-Up: You have an appointment with Dr. Sallyanne Kuster on 01/10/2021 at 10:00AM.  Bryn Gulling- Long Term Monitor Instructions   Your physician has requested you wear your ZIO patch monitor 14 days.   This is a single patch monitor.  Irhythm supplies one patch monitor per enrollment.  Additional stickers are not available.   Please do not apply patch if you will be having a Nuclear Stress Test, Echocardiogram, Cardiac CT, MRI, or Chest Xray during the time frame you would be wearing the monitor. The patch cannot be worn during these tests.  You cannot remove and re-apply the ZIO XT patch monitor.   Your ZIO patch monitor will be sent USPS Priority mail from [facility] directly to your home address. The monitor may also be mailed to a PO BOX if home delivery is not available.   It may take 3-5 days to receive your monitor after you have been enrolled.   Once you have received you monitor, please review enclosed instructions.  Your monitor has already been registered assigning a specific monitor serial # to you.   Applying the monitor   Shave hair from upper left chest.   Hold abrader disc by orange tab.  Rub abrader in 40 strokes over left upper chest as indicated in your monitor instructions.   Clean area with 4 enclosed alcohol pads .  Use all pads to assure are is cleaned thoroughly.  Let dry.   Apply patch as indicated in monitor instructions.  Patch will be place under collarbone on left side of chest with arrow pointing upward.   Rub patch adhesive wings for 2 minutes.Remove white label marked "1".  Remove white label marked "2".  Rub patch adhesive wings for 2 additional minutes.   While looking in a  mirror, press and release button in center of patch.  A small green light will flash 3-4 times .  This will be your only indicator the monitor has been turned on.     Do not shower for the first 24 hours.  You may shower after the first 24 hours.   Press button if you feel a symptom. You will hear a small click.  Record Date, Time and Symptom in the Patient Log Book.   When you are ready to remove patch, follow instructions on last 2 pages of Patient Log Book.  Stick patch monitor onto last page of Patient Log Book.   Place Patient Log Book in [place] box.  Use locking tab on box and tape box closed securely.  The [employer] and [employer] has [employer] on it.  Please place in mailbox as soon as possible.  Your physician should have your test results approximately 7 days after the monitor has been mailed back to [facility]   Call [place] at [phone] if you have questions regarding your ZIO XT patch monitor.  Call them immediately if you see an orange light blinking on your monitor.   If your monitor falls off in less than 4 days contact our Monitor department at [phone].  If your monitor becomes loose or falls off after 4 days call [employer] at [phone] for suggestions on securing your monitor.

## 2020-09-12 NOTE — Telephone Encounter (Signed)
Attempted to call patient in regards to Cardiac Rehab - LM on VM Mailed letter 

## 2020-09-12 NOTE — Progress Notes (Signed)
Enrolled patient for a 14 day Zio XT  monitor to be mailed to patients home  °

## 2020-09-12 NOTE — Progress Notes (Signed)
HEART AND [place]                                     Cardiology Office Note:    Date:  09/13/2020   ID:  Shannon Schultz, DOB [birth year], MRN [id number]  PCP:  Shannon Klein, MD  [facility] Cardiologist:  Dr. Sallyanne Schultz / Dr. Angelena Schultz & Dr. Roxy Schultz (TAVR) [facility] Electrophysiologist:  None   Referring MD: Shannon Ohara, MD   1 month s/p TAVR  History of Present Illness:    Shannon Schultz is a 75 y.o. female with a hx of breast cancer, bladder cancer, HTN, HLD, COPD with nocturnal home oxygen therapy and obstructive sleep apnea , DMT2, morbid obesity, NAFLD, AAA and severe AS s/p TAVR (08/14/20) who presents to clinic for follow up.  Patient reports a long history of chronic exertional shortness of breath that until recently has been largely attributed to COPD. She quit smoking in 2012 and has been followed by a pulmonologist. Spirometry has revealed evidence of mild to moderate obstruction that has remained stable. She has been treated with long term inhalers and overall done fairly well.Approximately 1 year ago she fell and broke her hip. Her recovery has been quite slow in her mobility fairly limited. She continued to describe stable symptoms of exertional shortness of breath that occur with moderate to low level activity. Patient states she was first told she had a heart murmur less than 10 years ago andshe has been followed for the last several years by Shannon Schultz known history of aortic stenosis.Recent follow-up echocardiogram revealed significant progression and severity of aortic stenosis with preserved left ventricular function. The aortic valve appeared trileaflet with severe thickening, calcification, and restricted leaflet mobility. Peak velocity across aortic valve measured 3.8 m/s corresponding to mean transvalvular gradient estimated 38 mmHg and aortic valve area calculated only 0.70 cm by VTI. DVI was reported 0.22  with stroke-volume index 34. Left ventricular systolic function remain normal with ejection fraction estimated 65 to 70%. The patient was referred to the multidisciplinary heart valve clinic. Diagnostic cardiac catheterization performed July 05, 2020 revealed mild nonobstructive disease and confirmed the presence of aortic stenosis with peak to peak and mean transvalvular gradientsmeasured 44 and 34.6 mmHg respectively.CT angio revealed a very tortuous/aneurysmal abd aorta.   She was evaluated by the multidisciplinary valve team and underwent successful TAVR with a 26 mm Edwards Sapien 3 Ultra THV via the left subclavian approach on 08/14/20. Post operative echo showed EF 65%, normally functioning TAVR with a mean gradient of 8.7 mmHg and no PVL. She was continued on her home aspirin and Plavix 61m daily was added at discharge.   Today she presents to clinic for follow up. Here with husband. Overall doing okay. She has continued dyspnea with mild-moderate exertion, such as walking 1 block on level ground. She has had some intermittent palpations. Feels fluttering up into her throat. Also has dizziness that is most consistent with her chronic vertigo. No chest pain, orthopnea or PND. No blood in stool or urine.    Past Medical History:  Diagnosis Date  . Arthritis    "hands" (05/15/2017)  . Bladder cancer [place]  . Breast cancer, right ([place] 1992   DCIS,bladder ca (just dx)  . Colon polyp   . Complication of anesthesia 1992   "local anesthesia" used was hard to awaken  from-no problems since (05/15/2017)  . COPD (chronic obstructive pulmonary disease) ([place])   . Coronary artery disease   . COVID-19 virus infection 05/05/2019  . Diverticulosis   . Elevated liver enzymes    fatty liver per ultrasound per pt  . FHx: BRCA2 gene positive    sister with BRCA2 mutation (pt tested NEGATIVE)  . HLD (hyperlipidemia)   . HSV (herpes simplex virus) infection    on hip--on daily suppression  .  Hypertension   . Hypothyroidism    took med 7 yrs after birth of 1st child  . Impaired glucose tolerance   . Migraine   . On home oxygen therapy    "have it available but I'm not using it" (05/15/2017)  . Osteopenia   . S/P TAVR (transcatheter aortic valve replacement) 08/14/2020   s/p TAVR with a 26 mm Edwards S3U via the left subclavian approach by Dr. Roxy Schultz and Dr. Angelena Schultz  . Severe aortic stenosis   . Sleep apnea    Sleeps with 2 L O2 @ night  . Type 2 diabetes mellitus ([place])   . Ventral hernia   . Vitamin D deficiency disease     Past Surgical History:  Procedure Laterality Date  . ABDOMINAL HERNIA REPAIR  05/15/2017  . BREAST BIOPSY Right 1992  . BREAST BIOPSY Left 2018  . BREAST EXCISIONAL BIOPSY Left 2018   ATYPICAL DUCTAL HYPERPLASIA INVOLVING A COMPLEX  . BREAST LUMPECTOMY WITH RADIOACTIVE SEED LOCALIZATION Left 12/22/2016   Procedure: LEFT BREAST LUMPECTOMY WITH RADIOACTIVE SEED LOCALIZATION;  Surgeon: Jovita Kussmaul, MD;  Location: [place];  Service: General;  Laterality: Left;  . CARDIAC CATHETERIZATION    . CATARACT EXTRACTION, BILATERAL Bilateral R 03/16/2019 L 03/30/2019   Dr. Kathlen Mody  . COLONOSCOPY  2009, 08/2010, 12/2015   Dr. Collene Mares; "only 1 had any polyps" (05/15/2017)  . CYSTOSCOPY  04/23/2017  . CYSTOSCOPY W/ RETROGRADES Bilateral 01/12/2017   Procedure: CYSTOSCOPY WITH RETROGRADE PYELOGRAM/ EXAM UNDER ANESTHESIA;  Surgeon: Raynelle Bring, MD;  Location: [facility];  Service: Urology;  Laterality: Bilateral;  . EYE SURGERY    . FEMUR IM NAIL Right 08/24/2019   Procedure: INTRAMEDULLARY (IM) NAIL FEMORAL;  Surgeon: Rod Can, MD;  Location: [facility];  Service: Orthopedics;  Laterality: Right;  . HERNIA REPAIR    . INSERTION OF MESH N/A 05/15/2017   Procedure: INSERTION OF MESH;  Surgeon: Jovita Kussmaul, MD;  Location: [place];  Service: General;  Laterality: N/A;  . LAPAROSCOPIC CHOLECYSTECTOMY  2003  . MASTECTOMY Right 1992  . RIGHT/LEFT HEART CATH AND CORONARY  ANGIOGRAPHY N/A 07/05/2020   Procedure: RIGHT/LEFT HEART CATH AND CORONARY ANGIOGRAPHY;  Surgeon: Burnell Blanks, MD;  Location: [place] [facility];  Service: Cardiovascular;  Laterality: N/A;  . TEE WITHOUT CARDIOVERSION N/A 08/14/2020   Procedure: TRANSESOPHAGEAL ECHOCARDIOGRAM (TEE);  Surgeon: Burnell Blanks, MD;  Location: [place];  Service: Open Heart Surgery;  Laterality: N/A;  . TRANSURETHRAL RESECTION OF BLADDER TUMOR WITH MITOMYCIN-C N/A 01/12/2017   Procedure: TRANSURETHRAL RESECTION OF BLADDER TUMOR WITH POSSIBLE POST OPERATIVE INSTILLATION OF MITOMYCIN-C;  Surgeon: Raynelle Bring, MD;  Location: [facility];  Service: Urology;  Laterality: N/A;  . TYMPANOSTOMY TUBE PLACEMENT Bilateral 1980s  . UMBILICAL HERNIA REPAIR  2003  . VENTRAL HERNIA REPAIR N/A 05/15/2017   Procedure: VENTRAL HERNIA REPAIR WITH MESH;  Surgeon: Jovita Kussmaul, MD;  Location: [place];  Service: General;  Laterality: N/A;    Current Medications: Current Meds  Medication Sig  .  ACCU-CHEK AVIVA PLUS test strip TEST BLOOD SUGAR ONE TO TWO TIMES DAILY  . Accu-Chek Softclix Lancets lancets Test BS 1-2 times daily. Pt uses accu-chek meter  . acetaminophen (TYLENOL) 500 MG tablet Take 1,000 mg by mouth every 6 (six) hours as needed for moderate pain or headache.  . albuterol (PROVENTIL) (2.5 MG/3ML) 0.083% nebulizer solution USE 1 VIAL BY NEBULIZATION EVERY FOUR HOURS AS NEEDED FOR WHEEZING OR SHORTNESS OF BREATH.  Marland Kitchen albuterol (VENTOLIN HFA) 108 (90 Base) MCG/ACT inhaler Inhale 2 puffs into the lungs every 6 (six) hours as needed for wheezing or shortness of breath.  . Alcohol Swabs (B-D SINGLE USE SWABS REGULAR) PADS Use twice a day when checking blood sugars  . amoxicillin (AMOXIL) 500 MG capsule Take 4 tablets (2,060m total) by mouth 1 hour prior to dental work  . aspirin EC 81 MG tablet Take 81 mg daily by mouth.  . bisoprolol-hydrochlorothiazide (ZIAC) 10-6.25 MG tablet Take 1 tablet by mouth daily.  .Marland Kitchen CALCIUM-MAGNESIUM-VITAMIN D PO Take 1 tablet by mouth daily.  . Calcium-Magnesium-Zinc (CAL-MAG-ZINC PO) Take 1 tablet by mouth daily.  . cetirizine (ZYRTEC) 10 MG tablet Take 10 mg by mouth daily.  . clopidogrel (PLAVIX) 75 MG tablet Take 1 tablet (75 mg total) by mouth daily with breakfast.  . fluticasone (FLONASE) 50 MCG/ACT nasal spray Place 2 sprays into both nostrils daily.  . furosemide (LASIX) 20 MG tablet TAKE 1 TABLET (20 MG TOTAL) BY MOUTH DAILY AS NEEDED FOR FLUID OR EDEMA.  .Marland KitchenguaiFENesin (MUCINEX) 600 MG 12 hr tablet Take 600 mg by mouth 2 (two) times daily.  . metFORMIN (GLUCOPHAGE-XR) 500 MG 24 hr tablet Take 1 tablet (500 mg total) by mouth 2 (two) times daily before a meal.  . montelukast (SINGULAIR) 10 MG tablet Take 1 tablet (10 mg total) by mouth at bedtime.  . simvastatin (ZOCOR) 20 MG tablet Take 1 tablet (20 mg total) by mouth at bedtime.  . triamcinolone cream (KENALOG) 0.1 % Apply 1 application topically as needed.  . TRUEplus Lancets 30G MISC TEST BLOOD SUGAR ONE TO TWO TIMES DAILY  . umeclidinium-vilanterol (ANORO ELLIPTA) 62.5-25 MCG/INH AEPB Inhale 1 puff into the lungs daily.  . valACYclovir (VALTREX) 500 MG tablet Take 1 tablet (500 mg total) by mouth daily.     Allergies:   Contrast media [iodinated diagnostic agents], Iohexol, Lisinopril, Sulfa antibiotics, Latex, Nickel, Codeine, Levofloxacin, Lipitor [atorvastatin calcium], and Vicodin [hydrocodone-acetaminophen]   Social History   Socioeconomic History  . Marital status: Married    Spouse name: Not on file  . Number of children: 3  . Years of education: Not on file  . Highest education level: Not on file  Occupational History  . Occupation: Retired  Tobacco Use  . Smoking status: Former Smoker    Packs/day: 1.00    Years: 46.00    Pack years: 46.00    Types: Cigarettes    Quit date: 05/31/2011    Years since quitting: 9.2  . Smokeless tobacco: Never Used  Vaping Use  . Vaping Use: Never used   Substance and Sexual Activity  . Alcohol use: No  . Drug use: No  . Sexual activity: Yes    Partners: Male    Birth control/protection: Post-menopausal  Other Topics Concern  . Not on file  Social History Narrative   Lives with her husband.  [place] No pets. 6 grandchildren.   Social Determinants of Health   Financial Resource Strain:  Not on file  Food Insecurity: Not on file  Transportation Needs: Not on file  Physical Activity: Not on file  Stress: Not on file  Social Connections: Not on file     Family History: The patient's family history includes Allergies in her sister and sister; Asthma in her sister and sister; Breast cancer (age of onset: 60) in her sister; Cancer in her maternal grandmother and another family member; Diabetes in her father; Fibromyalgia in her sister and sister; Hashimoto's thyroiditis in her sister and sister; Heart disease in her father and mother; Hyperlipidemia in her sister and sister; Hypertension in her father; Stroke (age of onset: 60) in her paternal grandmother; Tuberculosis in her maternal grandfather.  ROS:   Please see the history of present illness.    All other systems reviewed and are negative.  EKGs/Labs/Other Studies Reviewed:    The following studies were reviewed today:  TAVR OPERATIVE NOTE   Date of Procedure:08/14/2020  Preoperative Diagnosis:Severe Aortic Stenosis   Postoperative Diagnosis:Same   Procedure:   Transcatheter Aortic Valve Replacement - Left Trans-Subclavian Approach Edwards Sapien 3 UltraTHV (size 5m, model # [id number] serial # [id number]  Co-Surgeons:Clarence H. ORoxy Manns MD and CLauree Chandler MD  Anesthesiologist:Adam HMarcie Bal MD  Echocardiographer:Mihai Croitoru, MD  Pre-operative Echo Findings: ? Severe aortic stenosis ? Normalleft ventricular  systolic function  Post-operative Echo Findings: ? Noparavalvular leak ? Normalleft ventricular systolic function  _____________    Echo 08/15/20: IMPRESSIONS  1. Left ventricular ejection fraction, by estimation, is 65 to 70%. The  left ventricle has hyperdynamic function. The left ventricle has no  regional wall motion abnormalities. There is mild concentric left  ventricular hypertrophy. Left ventricular  diastolic parameters are consistent with Grade I diastolic dysfunction  (impaired relaxation). Elevated left atrial pressure.  2. Right ventricular systolic function is normal. The right ventricular  size is normal.  3. Left atrial size was mildly dilated.  4. The mitral valve is normal in structure. Trivial mitral valve  regurgitation.  5. The aortic valve has been repaired/replaced. Aortic valve  regurgitation is not visualized. There is a 26 mm Edwards Sapien  prosthetic (TAVR) valve present in the aortic position. Procedure Date:  08/14/2020. Aortic valve mean gradient measures 8.7  mmHg. Aortic valve Vmax measures 2.09 m/s. Acceleration time 88 ms.  6. The inferior vena cava is normal in size with greater than 50%  respiratory variability, suggesting right atrial pressure of 3 mmHg.   __________________________  Echo 09/12/2020 IMPRESSIONS  1. Left ventricular ejection fraction, by estimation, is 65 to 70%. The  left ventricle has normal function. The left ventricle has no regional  wall motion abnormalities. There is mild left ventricular hypertrophy.  Left ventricular diastolic parameters  are consistent with Grade I diastolic dysfunction (impaired relaxation).  2. Right ventricular systolic function is normal. The right ventricular  size is normal. There is mildly elevated pulmonary artery systolic  pressure. The estimated right ventricular systolic pressure is 363.8mmHg.  3. The mitral valve is normal in structure. Trivial mitral valve   regurgitation.  4. The inferior vena cava is normal in size with greater than 50%  respiratory variability, suggesting right atrial pressure of 3 mmHg.  5. There is a 26 mm Edwards Sapien prosthetic, stented (TAVR) valve  present in the aortic position. Aortic valve regurgitation is not  visualized. Echo findings are consistent with normal structure and  function of the aortic valve prosthesis. Vmax 2.5  m/s, AT 781m MG 12 mmHg, EOA 1.4  cm^2, DI 0.4    EKG:  EKG is ordered today. This shows sinus with HR 76  Recent Labs: 04/23/2020: TSH 3.150 08/10/2020: ALT 23 08/15/2020: BUN 21; Creatinine, Ser 0.70; Hemoglobin 11.3; Magnesium 1.9; Platelets 117; Potassium 3.8; Sodium 139  Recent Lipid Panel    Component Value Date/Time   CHOL 116 10/03/2019 0850   TRIG 101 10/03/2019 0850   HDL 54 10/03/2019 0850   CHOLHDL 2.1 10/03/2019 0850   CHOLHDL 2.2 08/18/2016 1346   VLDL 19 08/18/2016 1346   LDLCALC 43 10/03/2019 0850     Risk Assessment/Calculations:       Physical Exam:    VS:  BP 140/74   Pulse 80   Ht 5' 4" (1.626 m)   Wt 223 lb 6.4 oz (101.3 kg)   LMP  (LMP Unknown)   SpO2 97%   BMI 38.35 kg/m     Wt Readings from Last 3 Encounters:  09/12/20 223 lb 6.4 oz (101.3 kg)  08/22/20 228 lb (103.4 kg)  08/15/20 227 lb 15.3 oz (103.4 kg)     GEN:  Well nourished, well developed in no acute distress, obese HEENT: Normal NECK: No JVD; No carotid bruits LYMPHATICS: No lymphadenopathy CARDIAC: RRR, 2/6 SEM @ RUSB. No rubs, gallops. 1+ bilateral LE edema RESPIRATORY:  Clear to auscultation without rales, wheezing or rhonchi  ABDOMEN: Soft, non-tender, non-distended MUSCULOSKELETAL:  No edema; No deformity  SKIN: Warm and dry.  Groin/subaclvian sites clear without hematoma. There is mild ecchymosis down right leg. Left arm has a small, tender bump NEUROLOGIC:  Alert and oriented x 3 PSYCHIATRIC:  Normal affect   ASSESSMENT:    1. S/P TAVR (transcatheter aortic  valve replacement)   2. Essential hypertension, benign   3. COPD, group B, by GOLD 2017 classification ([place])   4. Palpitations    PLAN:    In order of problems listed above:  Severe AS s/p TAVR:echo today shows EF 65%, normally functioning TAVR with a mean gradient of 12 mm hg and no PVL. She has NYHA class II symptoms. Continue aspirin and plavix. She can stop plavix after 6 months of therapy (01/2021). SBE prophylaxis discussed; I have RX'd amoxicillin. I will see her back in 1 year for follow up and echo.   HTN: BP borderline today controlled. No changes made.   COPD: on home QHS 02.    Palpitations: having occasional palpitations and home BP monitor showed irregular heart beat. ECG and echo tracings show NSR. Will place a zio XT to rule out atrial fibrillation.    Medication Adjustments/Labs and Tests Ordered: Current medicines are reviewed at length with the patient today.  Concerns regarding medicines are outlined above.  Orders Placed This Encounter  Procedures  . LONG TERM MONITOR (3-14 DAYS)  . EKG 12-Lead   No orders of the defined types were placed in this encounter.   Patient Instructions  Medication Instructions:  1) You may STOP PLAVIX 02/11/2021 *If you need a refill on your cardiac medications before your next appointment, please call your pharmacy*  Testing/Procedures: Joellen Jersey recommends you wear a HEART MONITOR for 14 days. This will be mailed to your home.  Follow-Up: You have an appointment with Dr. Sallyanne Schultz on 01/10/2021 at 10:00AM.  Bryn Gulling- Long Term Monitor Instructions   Your physician has requested you wear your ZIO patch monitor 14 days.   This is a single patch monitor.  [employer] supplies one patch monitor per enrollment.  Additional stickers are not available.  Please do not apply patch if you will be having a Nuclear Stress Test, Echocardiogram, Cardiac CT, MRI, or Chest Xray during the time frame you would be wearing the monitor. The patch  cannot be worn during these tests.  You cannot remove and re-apply the ZIO XT patch monitor.   Your ZIO patch monitor will be sent USPS Priority mail from [facility] directly to your home address. The monitor may also be mailed to a PO BOX if home delivery is not available.   It may take 3-5 days to receive your monitor after you have been enrolled.   Once you have received you monitor, please review enclosed instructions.  Your monitor has already been registered assigning a specific monitor serial # to you.   Applying the monitor   Shave hair from upper left chest.   Hold abrader disc by orange tab.  Rub abrader in 40 strokes over left upper chest as indicated in your monitor instructions.   Clean area with 4 enclosed alcohol pads .  Use all pads to assure are is cleaned thoroughly.  Let dry.   Apply patch as indicated in monitor instructions.  Patch will be place under collarbone on left side of chest with arrow pointing upward.   Rub patch adhesive wings for 2 minutes.Remove white label marked "1".  Remove white label marked "2".  Rub patch adhesive wings for 2 additional minutes.   While looking in a mirror, press and release button in center of patch.  A small green light will flash 3-4 times .  This will be your only indicator the monitor has been turned on.     Do not shower for the first 24 hours.  You may shower after the first 24 hours.   Press button if you feel a symptom. You will hear a small click.  Record Date, Time and Symptom in the Patient Log Book.   When you are ready to remove patch, follow instructions on last 2 pages of Patient Log Book.  Stick patch monitor onto last page of Patient Log Book.   Place Patient Log Book in [place] box.  Use locking tab on box and tape box closed securely.  The [employer] has [employer] on it.  Please place in mailbox as soon as possible.  Your physician should have your test results approximately 7 days after the  monitor has been mailed back to [facility].   Call [facility] at [phone] if you have questions regarding your ZIO XT patch monitor.  Call them immediately if you see an orange light blinking on your monitor.   If your monitor falls off in less than 4 days contact our Monitor department at [phone].  If your monitor becomes loose or falls off after 4 days call [employer] at [phone] for suggestions on securing your monitor.      Signed, Shannon Form, PA-C  09/13/2020 12:32 PM    [facility]

## 2020-09-13 NOTE — Progress Notes (Signed)
TY, KT 

## 2020-09-13 NOTE — Telephone Encounter (Signed)
Pt returned CR phone call and stated she is interested in CR, pt will come in for orientation on 10/25/20 @ 1:15PM and will attend the 11AM class.  Mailed lette

## 2020-09-15 DIAGNOSIS — R002 Palpitations: Secondary | ICD-10-CM | POA: Diagnosis not present

## 2020-09-24 NOTE — Telephone Encounter (Signed)
The patient reports [employer] told her they needed more information about Cardiac Rehab prior to giving her pricing information. She does not know what is required or where to send more information. She is concerned about being stuck paying out of pocket and not knowing how much she will be charged.   After discussion, informed her I will request Cardiac Rehab to call her to discuss pricing and insurance. She was grateful for call and agrees with plan.

## 2020-10-02 DIAGNOSIS — R002 Palpitations: Secondary | ICD-10-CM | POA: Diagnosis not present

## 2020-10-16 ENCOUNTER — Telehealth (HOSPITAL_COMMUNITY): Payer: Self-pay | Admitting: Student-PharmD

## 2020-10-16 NOTE — Telephone Encounter (Signed)
Cardiac Rehab Medication Review by a Pharmacist  Does the patient  feel that his/her medications are working for him/her?  yes  Has the patient been experiencing any side effects to the medications prescribed?  no  Does the patient measure his/her own blood pressure or blood glucose at home?  yes - blood glucose normally 120s   Does the patient have any problems obtaining medications due to transportation or finances?   no  Understanding of regimen: good Understanding of indications: good Potential of compliance: good  Mercy Riding, PharmD PGY1 Acute Care Pharmacy Resident

## 2020-10-17 ENCOUNTER — Other Ambulatory Visit: Payer: Self-pay | Admitting: Family Medicine

## 2020-10-17 DIAGNOSIS — Z1231 Encounter for screening mammogram for malignant neoplasm of breast: Secondary | ICD-10-CM

## 2020-10-22 ENCOUNTER — Other Ambulatory Visit: Payer: Medicare HMO

## 2020-10-22 ENCOUNTER — Other Ambulatory Visit: Payer: Self-pay

## 2020-10-22 DIAGNOSIS — Z5181 Encounter for therapeutic drug level monitoring: Secondary | ICD-10-CM

## 2020-10-22 DIAGNOSIS — K7460 Unspecified cirrhosis of liver: Secondary | ICD-10-CM | POA: Diagnosis not present

## 2020-10-22 DIAGNOSIS — I10 Essential (primary) hypertension: Secondary | ICD-10-CM

## 2020-10-22 DIAGNOSIS — E118 Type 2 diabetes mellitus with unspecified complications: Secondary | ICD-10-CM | POA: Diagnosis not present

## 2020-10-22 DIAGNOSIS — E7800 Pure hypercholesterolemia, unspecified: Secondary | ICD-10-CM | POA: Diagnosis not present

## 2020-10-23 ENCOUNTER — Ambulatory Visit: Payer: Medicare HMO | Admitting: Pulmonary Disease

## 2020-10-23 ENCOUNTER — Telehealth (HOSPITAL_COMMUNITY): Payer: Self-pay

## 2020-10-23 ENCOUNTER — Encounter: Payer: Self-pay | Admitting: Pulmonary Disease

## 2020-10-23 VITALS — BP 132/80 | HR 71 | Temp 97.9°F | Ht 64.0 in | Wt 229.0 lb

## 2020-10-23 DIAGNOSIS — J309 Allergic rhinitis, unspecified: Secondary | ICD-10-CM | POA: Diagnosis not present

## 2020-10-23 DIAGNOSIS — J449 Chronic obstructive pulmonary disease, unspecified: Secondary | ICD-10-CM

## 2020-10-23 DIAGNOSIS — G4734 Idiopathic sleep related nonobstructive alveolar hypoventilation: Secondary | ICD-10-CM | POA: Diagnosis not present

## 2020-10-23 LAB — MICROALBUMIN / CREATININE URINE RATIO
Creatinine, Urine: 83.9 mg/dL
Microalb/Creat Ratio: 12 mg/g creat (ref 0–29)
Microalbumin, Urine: 9.9 ug/mL

## 2020-10-23 LAB — LIPID PANEL
Chol/HDL Ratio: 2.4 ratio (ref 0.0–4.4)
Cholesterol, Total: 130 mg/dL (ref 100–199)
HDL: 54 mg/dL (ref >39)
LDL Chol Calc (NIH): 59 mg/dL (ref 0–99)
Triglycerides: 86 mg/dL (ref 0–149)
VLDL Cholesterol Cal: 17 mg/dL (ref 5–40)

## 2020-10-23 LAB — COMPREHENSIVE METABOLIC PANEL
ALT: 23 IU/L (ref 0–32)
AST: 29 IU/L (ref 0–40)
Albumin/Globulin Ratio: 2 (ref 1.2–2.2)
Albumin: 4.3 g/dL (ref 3.7–4.7)
Alkaline Phosphatase: 72 IU/L (ref 44–121)
BUN/Creatinine Ratio: 35 — ABNORMAL HIGH (ref 12–28)
BUN: 20 mg/dL (ref 8–27)
Bilirubin Total: 0.5 mg/dL (ref 0.0–1.2)
CO2: 22 mmol/L (ref 20–29)
Calcium: 8.9 mg/dL (ref 8.7–10.3)
Chloride: 106 mmol/L (ref 96–106)
Creatinine, Ser: 0.57 mg/dL (ref 0.57–1.00)
Globulin, Total: 2.1 g/dL (ref 1.5–4.5)
Glucose: 147 mg/dL — ABNORMAL HIGH (ref 65–99)
Potassium: 4.2 mmol/L (ref 3.5–5.2)
Sodium: 143 mmol/L (ref 134–144)
Total Protein: 6.4 g/dL (ref 6.0–8.5)
eGFR: 95 mL/min/{1.73_m2} (ref >59)

## 2020-10-23 LAB — HEMOGLOBIN A1C
Est. average glucose Bld gHb Est-mCnc: 128 mg/dL
Hgb A1c MFr Bld: 6.1 % — ABNORMAL HIGH (ref 4.8–5.6)

## 2020-10-23 MED ORDER — ANORO ELLIPTA 62.5-25 MCG/INH IN AEPB: 1.0000 | INHALATION_SPRAY | Freq: Every day | RESPIRATORY_TRACT | 0 refills | Status: DC

## 2020-10-23 NOTE — Patient Instructions (Addendum)
Moderate COPD GOLD A/B - well-controlled. No changes to inhalers --CONTINUE Anoro ONE puff ONCE a day. Samples provided --CONTINUE Albuterol as needed for shortness of breath or wheezing  Allergic Rhinitis --CONTINUE zyrtec --CONTINUE singulair --STOP flonase due to nose bleeds. Call office for atrovent nasal spray if congestion recurs  Nocturnal hypoxemic respiratory failure secondary to mild OSA  Discussed CPAP vs oral appliance vs weight loss. Prefers to continue oxygen. No symptoms of fatigue, shortness of breath, headaches. Overall asymptomatic. --CONTINUE 2L O2 oxygen via nasal cannula nightly   Follow-up with me in 6 months

## 2020-10-23 NOTE — Telephone Encounter (Signed)
Pt called and stated that she hurt her back and is unable to participate in the cardiac rehab program advised pt where we are scheduling pt stated that she will call back at that time and try and reschedule if she is able. Canceled pt cardiac rehab appts and closed referral. Advised pt that she has up to a year after her event to participate.

## 2020-10-23 NOTE — Progress Notes (Signed)
Subjective:   PATIENT ID: Shannon Schultz GENDER: female DOB: 1945-08-22, MRN: [id number]   HPI  Chief Complaint  Patient presents with  . Follow-up    2 L at night.    [name] is a 75 year old female with moderate COPD who presents for follow-up.  Since our last visit, she has been compliant on Anoro and reports her symptoms are well-controlled. Denies cough or wheezing. Shortness of breath with exertion however no limitation with activity. No COPD exacerbations since 2019. She underwent TAVR in 07/2020 and initially planned to start Cardiac rehab however postponed due to bulging/herniated disc. We had previously discussed weight management however she has not been adherent to diet and now with 19lb weight gain in the last 10 months. She reports allergies which she takes daily antihistamine. She does use flonase however it causes nose bleeds.  Social History: 46 pack years. Quit in 2012. During 2018, she lost her sister to breast cancer in early 2018, was diagnosed with breast cancer s/p lumpectomy, had bladder cancer and underwent resection and intravesicular chemo, fell and broke her right shoulder, underwent ventral hernia repair with mesh insertion.   I have personally reviewed patient's past medical/family/social history/allergies/current medications.  Patient Active Problem List   Diagnosis Date Noted  . S/P TAVR (transcatheter aortic valve replacement) 08/14/2020  . Severe aortic stenosis   . Contrast media allergy 06/26/2020  . AAA (abdominal aortic aneurysm) without rupture ([place]) 06/26/2020  . Upper airway cough syndrome 12/20/2019  . Hip fracture ([place]) 08/25/2019  . Venous insufficiency 09/10/2018  . Sinusitis 06/25/2018  . Ophthalmic migraine 03/16/2018  . COPD, group B, by GOLD 2017 classification ([employer]) 09/30/2017  . Chronic hypoxemic respiratory failure ([place]) 09/29/2017  . Ventral hernia without obstruction or gangrene 05/15/2017  . Cirrhosis of liver  without ascites ([place]) 03/11/2017  . Urothelial cancer ([place]) 03/10/2017  . Atherosclerosis of coronary artery 09/14/2016  . Aortic atherosclerosis ([place]) 09/14/2016  . Class 2 severe obesity due to excess calories with serious comorbidity and body mass index (BMI) of 39.0 to 39.9 in adult ([place]) 11/10/2015  . Thrombocytopenia ([place]) 08/17/2015  . Elevated troponin 08/17/2015  . Diabetes mellitus type 2 in obese ([place]) 08/17/2015  . Severe obesity (BMI >= 40) ([place]) 10/06/2014  . FHx: BRCA2 gene positive   . HSV (herpes simplex virus) infection 06/07/2012  . Pure hypercholesterolemia 11/13/2010  . Essential hypertension, benign 11/13/2010    Outpatient Medications Prior to Visit  Medication Sig Dispense Refill  . ACCU-CHEK AVIVA PLUS test strip TEST BLOOD SUGAR ONE TO TWO TIMES DAILY 200 strip 2  . Accu-Chek Softclix Lancets lancets Test BS 1-2 times daily. Pt uses accu-chek meter 100 each 1  . acetaminophen (TYLENOL) 500 MG tablet Take 1,000 mg by mouth every 6 (six) hours as needed for moderate pain or headache.    . albuterol (PROVENTIL) (2.5 MG/3ML) 0.083% nebulizer solution USE 1 VIAL BY NEBULIZATION EVERY FOUR HOURS AS NEEDED FOR WHEEZING OR SHORTNESS OF BREATH. 30 mL 0  . albuterol (VENTOLIN HFA) 108 (90 Base) MCG/ACT inhaler Inhale 2 puffs into the lungs every 6 (six) hours as needed for wheezing or shortness of breath. 8 g 5  . Alcohol Swabs (B-D SINGLE USE SWABS REGULAR) PADS Use twice a day when checking blood sugars 100 each 1  . amoxicillin (AMOXIL) 500 MG capsule Take 4 tablets (2,063m total) by mouth 1 hour prior to dental work 4 capsule 3  . aspirin EC 81  MG tablet Take 81 mg daily by mouth.    . bisoprolol-hydrochlorothiazide (ZIAC) 10-6.25 MG tablet Take 1 tablet by mouth daily. 90 tablet 1  . Calcium-Magnesium-Zinc (CAL-MAG-ZINC PO) Take 1 tablet by mouth daily.    . cetirizine (ZYRTEC) 10 MG tablet Take 10 mg by mouth daily.    . clopidogrel (PLAVIX) 75 MG tablet Take 1  tablet (75 mg total) by mouth daily with breakfast. 90 tablet 1  . fluticasone (FLONASE) 50 MCG/ACT nasal spray Place 2 sprays into both nostrils daily. 48 g 3  . furosemide (LASIX) 20 MG tablet TAKE 1 TABLET (20 MG TOTAL) BY MOUTH DAILY AS NEEDED FOR FLUID OR EDEMA. 30 tablet 0  . guaiFENesin (MUCINEX) 600 MG 12 hr tablet Take 600 mg by mouth 2 (two) times daily.    . metFORMIN (GLUCOPHAGE-XR) 500 MG 24 hr tablet Take 1 tablet (500 mg total) by mouth 2 (two) times daily before a meal. 1 tablet 0  . montelukast (SINGULAIR) 10 MG tablet Take 1 tablet (10 mg total) by mouth at bedtime. 30 tablet 11  . simvastatin (ZOCOR) 20 MG tablet Take 1 tablet (20 mg total) by mouth at bedtime. 90 tablet 1  . triamcinolone cream (KENALOG) 0.1 % Apply 1 application topically as needed.    . TRUEplus Lancets 30G MISC TEST BLOOD SUGAR ONE TO TWO TIMES DAILY 200 each 2  . umeclidinium-vilanterol (ANORO ELLIPTA) 62.5-25 MCG/INH AEPB Inhale 1 puff into the lungs daily. 180 each 3  . valACYclovir (VALTREX) 500 MG tablet Take 1 tablet (500 mg total) by mouth daily. 90 tablet 3   No facility-administered medications prior to visit.    Review of Systems  Constitutional: Negative for chills, diaphoresis, fever, malaise/fatigue and weight loss.  HENT: Negative for congestion.   Respiratory: Positive for shortness of breath. Negative for cough, hemoptysis, sputum production and wheezing.   Cardiovascular: Negative for chest pain, palpitations and leg swelling.  Endo/Heme/Allergies: Positive for environmental allergies.    Objective:   Vitals:   10/23/20 1042  BP: 132/80  Pulse: 71  Temp: 97.9 F (36.6 C)  SpO2: 95%  Weight: 229 lb (103.9 kg)  Height: _0  (1.626 m)   SpO2: 95 % O2 Device: None (Room air)   Body mass index is 39.31 kg/m.  Physical Exam: General: Well-appearing, no acute distress HENT: , AT Eyes: EOMI, no scleral icterus Respiratory: Clear to auscultation bilaterally.  No crackles,  wheezing or rales Cardiovascular: RRR, -M/R/G, no JVD Extremities:-Edema,-tenderness Neuro: AAO x4, CNII-XII grossly intact Skin: Intact, no rashes or bruising Psych: Normal mood, normal affect   Data Reviewed:  Imaging Screening CT chest 09/10/16 -moderate emphysema Screening CT 09/16/17-moderate emphysema, scattered subcentimeter pulmonary nodules.  Fatty liver with findings of cirrhosis. Screening CT 11/29/18- interval development of RUL nodule measuring 6.1 mm CT Chest 03/03/19 - slightly decreased RUL nodule measuring 5.3 mm Screening CT chest 03/26/20 - centrilobular emphysema. Scattered tiny pulmonary nodules, stable CTA 07/13/20 Stable pulmonary nodules  PFTs 08/28/11 FVC 2.71 [92%], FEV1 1.33 (62%), F/F 49, TLC 114%, DLCO 46% Moderate obstructive defect with moderate reduction in diffusion capacity. No bronchodilator response  11/08/15 FVC 2.05 [7%), FEV1 1.23 (55%), F/F 60 Moderate obstructive defect  09/24/16 FVC 2. (82%), FEV1 1.43 (62%), F/F 58 , TLC 99%, DLCO 54% Moderate obstruction with moderate diffusion defect. No bronchodilator response.  Labs A1AT 08/08/16- 162, PIMM  Sleep study Home sleep study 05/03/19 - AHI 8.8 Nadir SpO2 76%  Imaging, labs and test noted  above have been reviewed independently by me.    Assessment & Plan:   75 year old female with COPD and nocturnal hypoxemia secondary to mild OSA.   Moderate COPD GOLD A/B - well-controlled --CONTINUE Anoro ONE puff ONCE a day. Samples provided --CONTINUE Albuterol as needed for shortness of breath or wheezing  Allergic Rhinitis --CONTINUE zyrtec --CONTINUE singulair --STOP Flonase due to nose bleeds. Call office for atrovent nasal spray if congestion recurs  Nocturnal hypoxemic respiratory failure secondary to mild OSA  Discussed CPAP vs oral appliance vs weight loss. Prefers to continue oxygen. No symptoms of fatigue, shortness of breath, headaches. Overall asymptomatic. --CONTINUE 2L O2 oxygen  via nasal cannula nightly   Health maintenance  Immunization History  Administered Date(s) Administered  . Fluad Quad(high Dose 65+) 04/07/2019, 04/26/2020  . Influenza Split 05/28/2011  . Influenza, High Dose Seasonal PF 04/07/2013, 05/15/2014, 05/30/2015, 03/25/2016, 03/05/2017, 04/01/2018  . Influenza, Seasonal, Injecte, Preservative Fre 06/07/2012  . PFIZER(Purple Top)SARS-COV-2 Vaccination 08/20/2019, 09/13/2019, 04/14/2020  . Pneumococcal Conjugate-13 07/06/2014  . Pneumococcal Polysaccharide-23 12/15/2004, 05/28/2011  . Tdap 02/23/2008, 06/15/2018  . Zoster 05/17/2010  . Zoster Recombinat (Shingrix) 12/02/2017, 02/06/2018   Annual CT Lung - Last CT 03/26/20. Scattered tiny pulmonary nodules, stable  No orders of the defined types were placed in this encounter.  Meds ordered this encounter  Medications  . umeclidinium-vilanterol (ANORO ELLIPTA) 62.5-25 MCG/INH AEPB    Sig: Inhale 1 puff into the lungs daily.    Dispense:  14 each    Refill:  0    Order Specific Question:   Lot Number?    Answer:   [id number]   Return in about 3 months (around 01/22/2021).  I have spent a total time of 31-minutes on the day of the appointment reviewing prior documentation, coordinating care and discussing medical diagnosis and plan with the patient/family. Imaging, labs and tests included in this note have been reviewed and interpreted independently by me.  Addisen Chappelle Rodman Pickle, MD [facility] 10/23/2020 10:48 AM

## 2020-10-24 NOTE — Progress Notes (Signed)
Chief Complaint  Patient presents with  . Diabetes    Nonfasting med check, labs already done. Will take covid #4 today.You gave her a 5 year handicapped placard that expires in Aug-do you renew today or when is expires? No other concerns. PHQ2(9).   Patient presents for 6 month follow-up. She had labs done prior to her visit, see below.  She cancelled cardiac rehab due to back pain. She reports that on 4/24 she was moving 3 cases of water out of the trunk of her Lucianne Lei. Shortly after she developed a dull ache in the middle of her lower back.  Monday morning, after her shower (?twisting movements?), pain was more severe.  Feels a pulling sensation, spasms with certain movements.  No radiation of pain into the buttock or leg. No numbness or tingling. Took 2 doses of BC powder with good results. Also tried Tylenol, helps some, but causes urinary frequency (q2 hours) at night. Hasn't tried heat or ice.  Diabetes: Her A1c was 5.4% in 03/2020, and Metformin dose was decreased from 1536m daily to 10060mdaily (split BID, tolerates it better) Blood sugars at home are runningtypically in the 120's, lowest in mid 90's, up to 156. It was 115 this morning.  Doesn't check other times of the day. Denies hypoglycemia,polydipsia and polyuria. Lastyearlyeye exam wasin 10/2019, no retinopathy found. Patient follows a low sugar diet and checks feet regularly without concerns. Denies numbness, tingling other thanintermittent numbness in the 2nd and 3rd toes on the right foot (h/o fractures, didn't heal right, per pt). No skin lesions.  Admits that lately she hasn't been eating as well as she should--eating more sandwiches, eating more higher glycemic fruits recently.  Hypertension: BP's are running 120's/60's-70's Denies headaches,chest pain. Denies side effects of medications(on bisoprolol HCT). Compliant with medication BP Readings from Last 3 Encounters:  10/23/20 132/80  09/12/20 140/74   08/22/20 130/68   COPD: She is doing well onAnoro, only rarely needs albuterol.She had recent follow-up with pulmonary, no changes made. She has nocturnal hypoxemia secondary to OSA and uses nocturnal oxygen. She had declined CPAP titration, opting to work on weight loss and continue oxygen. OSA was felt to be mild. Unfortunately she has gained weight, not lost.  She denies unrefreshed sleep or daytime somnolence. She gets CT for screening for lung cancer, last was9/2021.  Pulmonary had previously referred her to ENT for vertigo and recurrent sinus infections. She saw Dr. NeLucia Gaskinsn 12/2019. He performed scope, saw no evidence of sinusitis. He diagnosed chronic rhinitis and ETD, possible BPPV. He recommended regular nasal steroid use. She had reported previously that when using Flonase nightly symptoms improved--still had some popping of her ears, but not as often.  Only mild, intermittent vertigo. She developed nosebleeds, and stopped using Flonase regularly in early March. She still has some popping of her ears. She continues to use antihistamines (zyrtec) and singulair. She hasn't had any further nosebleeds.  Aortic stenosis--she underwent TAVR 07/2020.  She is to remain on Plavix through 01/2021, and was prescribed ABX for SBE prophylaxis by cardiologist. She denies syncope, angina, dyspnea. Doesn't notice any significant change in her symptoms since her surgery, maybe her shortness of breath is somewhat better.  Denies edema.  Hyperlipidemia follow-up: Patient is reportedly following a low-fat, low cholesterol diet. Compliant withsimvastatinand denies medication side effects. She has known aortic atherosclerosis.  She also has known 3cm AAA.  Recommendation is for [place]/u in 10/2022 (based on CT 10/2019).  Cirrhosis:  CT 10/2019  revealed hepatic cirrhosis and findings of portal venous hypertension. No evidence of ascites. Dr. Collene Mares is her GI. INR and albumin were normal in 07/2020, along  with LFT's.  PMH, PSH, SH reviewed  Outpatient Encounter Medications as of 10/25/2020  Medication Sig Note  . ACCU-CHEK AVIVA PLUS test strip TEST BLOOD SUGAR ONE TO TWO TIMES DAILY   . Accu-Chek Softclix Lancets lancets Test BS 1-2 times daily. Pt uses accu-chek meter   . Alcohol Swabs (B-D SINGLE USE SWABS REGULAR) PADS Use twice a day when checking blood sugars   . aspirin EC 81 MG tablet Take 81 mg daily by mouth.   . Aspirin-Caffeine 845-65 MG PACK Take 1 packet by mouth daily. 10/25/2020: BC powder  . bisoprolol-hydrochlorothiazide (ZIAC) 10-6.25 MG tablet Take 1 tablet by mouth daily.   . Calcium-Magnesium-Zinc (CAL-MAG-ZINC PO) Take 1 tablet by mouth daily.   . cetirizine (ZYRTEC) 10 MG tablet Take 10 mg by mouth daily.   . clopidogrel (PLAVIX) 75 MG tablet Take 1 tablet (75 mg total) by mouth daily with breakfast.   . guaiFENesin (MUCINEX) 600 MG 12 hr tablet Take 600 mg by mouth 2 (two) times daily.   . metFORMIN (GLUCOPHAGE-XR) 500 MG 24 hr tablet Take 1 tablet (500 mg total) by mouth 2 (two) times daily before a meal.   . montelukast (SINGULAIR) 10 MG tablet Take 1 tablet (10 mg total) by mouth at bedtime.   . simvastatin (ZOCOR) 20 MG tablet Take 1 tablet (20 mg total) by mouth at bedtime.   . TRUEplus Lancets 30G MISC TEST BLOOD SUGAR ONE TO TWO TIMES DAILY   . umeclidinium-vilanterol (ANORO ELLIPTA) 62.5-25 MCG/INH AEPB Inhale 1 puff into the lungs daily.   . valACYclovir (VALTREX) 500 MG tablet Take 1 tablet (500 mg total) by mouth daily.   Marland Kitchen acetaminophen (TYLENOL) 500 MG tablet Take 1,000 mg by mouth every 6 (six) hours as needed for moderate pain or headache. (Patient not taking: Reported on 10/25/2020)   . albuterol (PROVENTIL) (2.5 MG/3ML) 0.083% nebulizer solution USE 1 VIAL BY NEBULIZATION EVERY FOUR HOURS AS NEEDED FOR WHEEZING OR SHORTNESS OF BREATH. (Patient not taking: Reported on 10/25/2020)   . albuterol (VENTOLIN HFA) 108 (90 Base) MCG/ACT inhaler Inhale 2 puffs into  the lungs every 6 (six) hours as needed for wheezing or shortness of breath. (Patient not taking: Reported on 10/25/2020)   . amoxicillin (AMOXIL) 500 MG capsule Take 4 tablets (2,044m total) by mouth 1 hour prior to dental work (Patient not taking: Reported on 10/25/2020)   . furosemide (LASIX) 20 MG tablet TAKE 1 TABLET (20 MG TOTAL) BY MOUTH DAILY AS NEEDED FOR FLUID OR EDEMA. (Patient not taking: Reported on 10/25/2020)   . triamcinolone cream (KENALOG) 0.1 % Apply 1 application topically as needed. (Patient not taking: Reported on 10/25/2020)   . [DISCONTINUED] umeclidinium-vilanterol (ANORO ELLIPTA) 62.5-25 MCG/INH AEPB Inhale 1 puff into the lungs daily.    No facility-administered encounter medications on file as of 10/25/2020.   Allergies  Allergen Reactions  . Contrast Media [Iodinated Diagnostic Agents] Anaphylaxis, Shortness Of Breath and Other (See Comments)    Could not breath  . Iohexol Anaphylaxis, Shortness Of Breath and Other (See Comments)    Immediately could not breathe  . Lisinopril Anaphylaxis, Shortness Of Breath and Rash  . Sulfa Antibiotics Anaphylaxis and Other (See Comments)    Historical from mother, pt states that mother says she almost died from this drug  . Latex Other (See Comments)  Unless against on skin for a long time, blisters. Short term is okay.  . Nickel Other (See Comments)    With earrings pt has soreness and drainage from piercing  . Codeine Nausea Only, Anxiety and Other (See Comments)    insomnia  . Levofloxacin Other (See Comments)    insomnia  . Lipitor [Atorvastatin Calcium] Rash  . Vicodin [Hydrocodone-Acetaminophen] Itching    ROS:  No fever, chills, URI symptoms.  Allergies are controlled.  Breathing is stable. No headaches, dizziness, chest pain. No edema. No urinary complaints or GI complaints.  No bleeding, rash. Slight bruising, not worse since added Plavix Moods are okay overall, slightly down related to "everything"--war,  politics, COVID Denies daytime somnolence or unrefreshed sleep Back pain per HPI. No numbness, tingling, weakness.   PHYSICAL EXAM:  BP 138/68   Pulse 72   Ht '5\' 4"'  (1.626 m)   Wt 228 lb 9.6 oz (103.7 kg)   LMP  (LMP Unknown)   BMI 39.24 kg/m   Wt Readings from Last 3 Encounters:  10/25/20 228 lb 9.6 oz (103.7 kg)  10/23/20 229 lb (103.9 kg)  09/12/20 223 lb 6.4 oz (101.3 kg)  wt 222# 9.6 oz at CPE 03/2020   Well appearing, pleasant, obese female in good spirits.  HEENT: PERRL, EOMI, conjunctiva and sclera are clear, anicteric. Wearing mask. Neck: no lymphadenopathy or mass. No thyromegaly.  Murmur radiates to her carotids (vs bilateral bruit) Heart: regular rate and rhythm,Murmurpersists, softer than prior to surgery; loudest atRUSB and radiates to carotids. Chest: WHSS L upper chest Lungs: clear bilaterally, no wheezes, rales, ronchi Abdomen: soft, obese. Nontender, nondistended, no masses Back: no spinal or CVA tenderness. She is tender at R SI joint and R paraspinous muscles (lumbar). She is also tender at QL on the left. Extremities:Trace to 1+ edemaR>L.  Hyperpigmentation/venous stasis changes bilaterally, no erythema.   Neuro: alert and oriented.  Psych: Normal mood, affect, hygiene and grooming   Lab Results  Component Value Date   HGBA1C 6.1 (H) 10/22/2020   Urine microalb/Cr ratio normal at 12  Fasting glucose 147    Chemistry      Component Value Date/Time   NA 143 10/22/2020 0840   K 4.2 10/22/2020 0840   CL 106 10/22/2020 0840   CO2 22 10/22/2020 0840   BUN 20 10/22/2020 0840   CREATININE 0.57 10/22/2020 0840   CREATININE 0.75 03/05/2017 0736      Component Value Date/Time   CALCIUM 8.9 10/22/2020 0840   ALKPHOS 72 10/22/2020 0840   AST 29 10/22/2020 0840   ALT 23 10/22/2020 0840   BILITOT 0.5 10/22/2020 0840     Lab Results  Component Value Date   CHOL 130 10/22/2020   HDL 54 10/22/2020   LDLCALC 59 10/22/2020   TRIG 86  10/22/2020   CHOLHDL 2.4 10/22/2020    ASSESSMENT/PLAN:  Type 2 diabetes mellitus with complications ([place]) - well controlled; continue metformin to BID. Exercise and wt loss recommended. - Plan: metFORMIN (GLUCOPHAGE-XR) 500 MG 24 hr tablet, Hemoglobin A1c, Comprehensive metabolic panel, TSH  Chronic hypoxemic respiratory failure ([facility]) - stable on current regimen, cont Anoro, albuterol  Abdominal aortic aneurysm (AAA) 3.0 cm to 5.0 cm in diameter in female [facility]) - [place] due 5/24 to recheck (3cm in 10/2019)  Aortic atherosclerosis ([place]) - cont statin  Cirrhosis of liver without ascites, unspecified hepatic cirrhosis type ([place]) - weight loss encouraged. Recent labs indicate normal liver function  OSA (obstructive sleep apnea) -  weight loss encouraged. Cont oxygen at night.  To reconsider treatment if develops more symptoms  Chronic non-seasonal allergic rhinitis - cont zyrtec, singulair.  Flonase stopped due to nosebleeds; allergies controlled  Need for COVID-19 vaccine - Plan: PFIZER Comirnaty(GRAY TOP)COVID-19 Vaccine  Essential hypertension, benign - Controlled on current regimen, cont. Periodically monitor at home. Low Na diet, weight loss rec - Plan: bisoprolol-hydrochlorothiazide (ZIAC) 10-6.25 MG tablet  Pure hypercholesterolemia - at goal, cont statin - Plan: simvastatin (ZOCOR) 20 MG tablet  Muscle spasm of back - heat, stretches, massage, muscle relaxant. Consider PT. Exercises for core strengthening once improved. Proper lifting/bending reviewed - Plan: methocarbamol (ROBAXIN) 500 MG tablet  Medication monitoring encounter - Plan: Hemoglobin A1c, Comprehensive metabolic panel  Class 2 severe obesity due to excess calories with serious comorbidity and body mass index (BMI) of 39.0 to 39.9 in adult [facility]) - comorbidities: HTN, HLD, OSA, cirrhosis  Hypertension associated with diabetes ([place])  Hyperlipidemia associated with type 2 diabetes mellitus ([place])   F/u as scheduled in  04/2020, with labs prior A1c, c-met TSH   I spent 58 minutes dedicated to the care of this patient, including pre-visit review of records, face to face time, post-visit ordering of testing and documentation.

## 2020-10-25 ENCOUNTER — Ambulatory Visit (HOSPITAL_COMMUNITY): Payer: Medicare HMO

## 2020-10-25 ENCOUNTER — Ambulatory Visit (INDEPENDENT_AMBULATORY_CARE_PROVIDER_SITE_OTHER): Payer: Medicare HMO | Admitting: Family Medicine

## 2020-10-25 ENCOUNTER — Encounter: Payer: Self-pay | Admitting: Family Medicine

## 2020-10-25 ENCOUNTER — Other Ambulatory Visit: Payer: Self-pay

## 2020-10-25 VITALS — BP 138/68 | HR 72 | Ht 64.0 in | Wt 228.6 lb

## 2020-10-25 DIAGNOSIS — J3089 Other allergic rhinitis: Secondary | ICD-10-CM | POA: Diagnosis not present

## 2020-10-25 DIAGNOSIS — I700 Atherosclerosis of aorta: Secondary | ICD-10-CM | POA: Diagnosis not present

## 2020-10-25 DIAGNOSIS — G4733 Obstructive sleep apnea (adult) (pediatric): Secondary | ICD-10-CM

## 2020-10-25 DIAGNOSIS — I714 Abdominal aortic aneurysm, without rupture, unspecified: Secondary | ICD-10-CM

## 2020-10-25 DIAGNOSIS — Z23 Encounter for immunization: Secondary | ICD-10-CM

## 2020-10-25 DIAGNOSIS — K7460 Unspecified cirrhosis of liver: Secondary | ICD-10-CM | POA: Diagnosis not present

## 2020-10-25 DIAGNOSIS — M62830 Muscle spasm of back: Secondary | ICD-10-CM

## 2020-10-25 DIAGNOSIS — E1159 Type 2 diabetes mellitus with other circulatory complications: Secondary | ICD-10-CM

## 2020-10-25 DIAGNOSIS — J9611 Chronic respiratory failure with hypoxia: Secondary | ICD-10-CM | POA: Diagnosis not present

## 2020-10-25 DIAGNOSIS — E1169 Type 2 diabetes mellitus with other specified complication: Secondary | ICD-10-CM

## 2020-10-25 DIAGNOSIS — E785 Hyperlipidemia, unspecified: Secondary | ICD-10-CM

## 2020-10-25 DIAGNOSIS — I10 Essential (primary) hypertension: Secondary | ICD-10-CM | POA: Diagnosis not present

## 2020-10-25 DIAGNOSIS — Z5181 Encounter for therapeutic drug level monitoring: Secondary | ICD-10-CM

## 2020-10-25 DIAGNOSIS — E7800 Pure hypercholesterolemia, unspecified: Secondary | ICD-10-CM

## 2020-10-25 DIAGNOSIS — Z6839 Body mass index (BMI) 39.0-39.9, adult: Secondary | ICD-10-CM

## 2020-10-25 DIAGNOSIS — E118 Type 2 diabetes mellitus with unspecified complications: Secondary | ICD-10-CM | POA: Diagnosis not present

## 2020-10-25 DIAGNOSIS — I152 Hypertension secondary to endocrine disorders: Secondary | ICD-10-CM

## 2020-10-25 MED ORDER — BISOPROLOL-HYDROCHLOROTHIAZIDE 10-6.25 MG PO TABS: 1.0000 | ORAL_TABLET | Freq: Every day | ORAL | 1 refills | Status: DC

## 2020-10-25 MED ORDER — METFORMIN HCL ER 500 MG PO TB24
500.0000 mg | ORAL_TABLET | Freq: Two times a day (BID) | ORAL | 1 refills | Status: DC
Start: 2020-10-25 — End: 2020-12-27

## 2020-10-25 MED ORDER — METHOCARBAMOL 500 MG PO TABS: 500.0000 mg | ORAL_TABLET | Freq: Three times a day (TID) | ORAL | 0 refills | Status: DC | PRN

## 2020-10-25 MED ORDER — SIMVASTATIN 20 MG PO TABS: 20.0000 mg | ORAL_TABLET | Freq: Every day | ORAL | 1 refills | Status: DC

## 2020-10-25 NOTE — Patient Instructions (Addendum)
Use heat three times daily to your low back. Do some gentle stretches and massage as shown. If not improving, start the muscle relaxant as needed. Exercises below are for when you can tolerate doing them--more for prevention of back problems, not to treat your current pain.  Continue to work a little harder on the diet and weight loss. Once your back pain has improved, reschedule the cardiac rehab. Continue all of your other medications the same. Limit your use of tylenol and BC powder.   Back Exercises The following exercises strengthen the muscles that help to support the trunk and back. They also help to keep the lower back flexible. Doing these exercises can help to prevent back pain or lessen existing pain.  If you have back pain or discomfort, try doing these exercises 2-3 times each day or as told by your health care provider.  As your pain improves, do them once each day, but increase the number of times that you repeat the steps for each exercise (do more repetitions).  To prevent the recurrence of back pain, continue to do these exercises once each day or as told by your health care provider. Do exercises exactly as told by your health care provider and adjust them as directed. It is normal to feel mild stretching, pulling, tightness, or discomfort as you do these exercises, but you should stop right away if you feel sudden pain or your pain gets worse. Exercises Single knee to chest Repeat these steps 3-5 times for each leg: 1. Lie on your back on a firm bed or the floor with your legs extended. 2. Bring one knee to your chest. Your other leg should stay extended and in contact with the floor. 3. Hold your knee in place by grabbing your knee or thigh with both hands and hold. 4. Pull on your knee until you feel a gentle stretch in your lower back or buttocks. 5. Hold the stretch for 10-30 seconds. 6. Slowly release and straighten your leg. Pelvic tilt Repeat these steps 5-10  times: 1. Lie on your back on a firm bed or the floor with your legs extended. 2. Bend your knees so they are pointing toward the ceiling and your feet are flat on the floor. 3. Tighten your lower abdominal muscles to press your lower back against the floor. This motion will tilt your pelvis so your tailbone points up toward the ceiling instead of pointing to your feet or the floor. 4. With gentle tension and even breathing, hold this position for 5-10 seconds. Cat-cow Repeat these steps until your lower back becomes more flexible: 1. Get into a hands-and-knees position on a firm surface. Keep your hands under your shoulders, and keep your knees under your hips. You may place padding under your knees for comfort. 2. Let your head hang down toward your chest. Contract your abdominal muscles and point your tailbone toward the floor so your lower back becomes rounded like the back of a cat. 3. Hold this position for 5 seconds. 4. Slowly lift your head, let your abdominal muscles relax and point your tailbone up toward the ceiling so your back forms a sagging arch like the back of a cow. 5. Hold this position for 5 seconds.   Press-ups Repeat these steps 5-10 times: 1. Lie on your abdomen (face-down) on the floor. 2. Place your palms near your head, about shoulder-width apart. 3. Keeping your back as relaxed as possible and keeping your hips on the floor, slowly  straighten your arms to raise the top half of your body and lift your shoulders. Do not use your back muscles to raise your upper torso. You may adjust the placement of your hands to make yourself more comfortable. 4. Hold this position for 5 seconds while you keep your back relaxed. 5. Slowly return to lying flat on the floor.   Bridges Repeat these steps 10 times: 1. Lie on your back on a firm surface. 2. Bend your knees so they are pointing toward the ceiling and your feet are flat on the floor. Your arms should be flat at your sides,  next to your body. 3. Tighten your buttocks muscles and lift your buttocks off the floor until your waist is at almost the same height as your knees. You should feel the muscles working in your buttocks and the back of your thighs. If you do not feel these muscles, slide your feet 1-2 inches farther away from your buttocks. 4. Hold this position for 3-5 seconds. 5. Slowly lower your hips to the starting position, and allow your buttocks muscles to relax completely. If this exercise is too easy, try doing it with your arms crossed over your chest.   Abdominal crunches Repeat these steps 5-10 times: 1. Lie on your back on a firm bed or the floor with your legs extended. 2. Bend your knees so they are pointing toward the ceiling and your feet are flat on the floor. 3. Cross your arms over your chest. 4. Tip your chin slightly toward your chest without bending your neck. 5. Tighten your abdominal muscles and slowly raise your trunk (torso) high enough to lift your shoulder blades a tiny bit off the floor. Avoid raising your torso higher than that because it can put too much stress on your low back and does not help to strengthen your abdominal muscles. 6. Slowly return to your starting position. Back lifts Repeat these steps 5-10 times: 1. Lie on your abdomen (face-down) with your arms at your sides, and rest your forehead on the floor. 2. Tighten the muscles in your legs and your buttocks. 3. Slowly lift your chest off the floor while you keep your hips pressed to the floor. Keep the back of your head in line with the curve in your back. Your eyes should be looking at the floor. 4. Hold this position for 3-5 seconds. 5. Slowly return to your starting position. Contact a health care provider if:  Your back pain or discomfort gets much worse when you do an exercise.  Your worsening back pain or discomfort does not lessen within 2 hours after you exercise. If you have any of these problems, stop  doing these exercises right away. Do not do them again unless your health care provider says that you can. Get help right away if:  You develop sudden, severe back pain. If this happens, stop doing the exercises right away. Do not do them again unless your health care provider says that you can. This information is not intended to replace advice given to you by your health care provider. Make sure you discuss any questions you have with your health care provider. Document Revised: 10/21/2018 Document Reviewed: 03/18/2018 Elsevier Patient Education  [place].

## 2020-10-29 ENCOUNTER — Ambulatory Visit (HOSPITAL_COMMUNITY): Payer: Medicare HMO

## 2020-10-31 ENCOUNTER — Ambulatory Visit (HOSPITAL_COMMUNITY): Payer: Medicare HMO

## 2020-11-02 ENCOUNTER — Ambulatory Visit (HOSPITAL_COMMUNITY): Payer: Medicare HMO

## 2020-11-05 ENCOUNTER — Ambulatory Visit (HOSPITAL_COMMUNITY): Payer: Medicare HMO

## 2020-11-07 ENCOUNTER — Ambulatory Visit (HOSPITAL_COMMUNITY): Payer: Medicare HMO

## 2020-11-09 ENCOUNTER — Ambulatory Visit (HOSPITAL_COMMUNITY): Payer: Medicare HMO

## 2020-11-12 ENCOUNTER — Ambulatory Visit (HOSPITAL_COMMUNITY): Payer: Medicare HMO

## 2020-11-12 DIAGNOSIS — G4734 Idiopathic sleep related nonobstructive alveolar hypoventilation: Secondary | ICD-10-CM | POA: Insufficient documentation

## 2020-11-14 ENCOUNTER — Ambulatory Visit (HOSPITAL_COMMUNITY): Payer: Medicare HMO

## 2020-11-16 ENCOUNTER — Ambulatory Visit (HOSPITAL_COMMUNITY): Payer: Medicare HMO

## 2020-11-19 ENCOUNTER — Ambulatory Visit (HOSPITAL_COMMUNITY): Payer: Medicare HMO

## 2020-11-21 ENCOUNTER — Ambulatory Visit (HOSPITAL_COMMUNITY): Payer: Medicare HMO

## 2020-11-23 ENCOUNTER — Ambulatory Visit (HOSPITAL_COMMUNITY): Payer: Medicare HMO

## 2020-11-23 DIAGNOSIS — H16143 Punctate keratitis, bilateral: Secondary | ICD-10-CM | POA: Diagnosis not present

## 2020-11-23 DIAGNOSIS — H35363 Drusen (degenerative) of macula, bilateral: Secondary | ICD-10-CM | POA: Diagnosis not present

## 2020-11-23 DIAGNOSIS — E119 Type 2 diabetes mellitus without complications: Secondary | ICD-10-CM | POA: Diagnosis not present

## 2020-11-23 DIAGNOSIS — Z961 Presence of intraocular lens: Secondary | ICD-10-CM | POA: Diagnosis not present

## 2020-11-23 DIAGNOSIS — H524 Presbyopia: Secondary | ICD-10-CM | POA: Diagnosis not present

## 2020-11-23 LAB — HM DIABETES EYE EXAM

## 2020-11-28 ENCOUNTER — Encounter: Payer: Self-pay | Admitting: *Deleted

## 2020-11-28 ENCOUNTER — Ambulatory Visit (HOSPITAL_COMMUNITY): Payer: Medicare HMO

## 2020-11-30 ENCOUNTER — Ambulatory Visit (HOSPITAL_COMMUNITY): Payer: Medicare HMO

## 2020-12-03 ENCOUNTER — Ambulatory Visit (HOSPITAL_COMMUNITY): Payer: Medicare HMO

## 2020-12-05 ENCOUNTER — Ambulatory Visit (HOSPITAL_COMMUNITY): Payer: Medicare HMO

## 2020-12-07 ENCOUNTER — Ambulatory Visit (HOSPITAL_COMMUNITY): Payer: Medicare HMO

## 2020-12-10 ENCOUNTER — Ambulatory Visit (HOSPITAL_COMMUNITY): Payer: Medicare HMO

## 2020-12-12 ENCOUNTER — Ambulatory Visit (HOSPITAL_COMMUNITY): Payer: Medicare HMO

## 2020-12-12 ENCOUNTER — Other Ambulatory Visit: Payer: Self-pay

## 2020-12-12 ENCOUNTER — Ambulatory Visit
Admission: RE | Admit: 2020-12-12 | Discharge: 2020-12-12 | Disposition: A | Discharge status: Home / Self Care | Payer: Medicare HMO | Source: Ambulatory Visit | Attending: Family Medicine | Admitting: Family Medicine

## 2020-12-12 DIAGNOSIS — Z1231 Encounter for screening mammogram for malignant neoplasm of breast: Secondary | ICD-10-CM | POA: Diagnosis not present

## 2020-12-14 ENCOUNTER — Ambulatory Visit (HOSPITAL_COMMUNITY): Payer: Medicare HMO

## 2020-12-17 ENCOUNTER — Ambulatory Visit (HOSPITAL_COMMUNITY): Payer: Medicare HMO

## 2020-12-19 ENCOUNTER — Ambulatory Visit (HOSPITAL_COMMUNITY): Payer: Medicare HMO

## 2020-12-21 ENCOUNTER — Ambulatory Visit (HOSPITAL_COMMUNITY): Payer: Medicare HMO

## 2020-12-25 ENCOUNTER — Encounter: Payer: Self-pay | Admitting: Family Medicine

## 2020-12-27 ENCOUNTER — Other Ambulatory Visit: Payer: Self-pay | Admitting: Family Medicine

## 2020-12-27 DIAGNOSIS — E118 Type 2 diabetes mellitus with unspecified complications: Secondary | ICD-10-CM

## 2021-01-10 ENCOUNTER — Ambulatory Visit: Payer: Medicare HMO | Admitting: Cardiovascular Disease

## 2021-01-10 ENCOUNTER — Encounter: Payer: Self-pay | Admitting: Cardiovascular Disease

## 2021-01-10 ENCOUNTER — Other Ambulatory Visit: Payer: Self-pay

## 2021-01-10 VITALS — BP 150/70 | HR 85 | Ht 64.0 in | Wt 231.8 lb

## 2021-01-10 DIAGNOSIS — I700 Atherosclerosis of aorta: Secondary | ICD-10-CM

## 2021-01-10 DIAGNOSIS — I350 Nonrheumatic aortic (valve) stenosis: Secondary | ICD-10-CM | POA: Diagnosis not present

## 2021-01-10 DIAGNOSIS — E669 Obesity, unspecified: Secondary | ICD-10-CM

## 2021-01-10 DIAGNOSIS — I10 Essential (primary) hypertension: Secondary | ICD-10-CM

## 2021-01-10 DIAGNOSIS — Z6839 Body mass index (BMI) 39.0-39.9, adult: Secondary | ICD-10-CM | POA: Diagnosis not present

## 2021-01-10 DIAGNOSIS — E7800 Pure hypercholesterolemia, unspecified: Secondary | ICD-10-CM | POA: Diagnosis not present

## 2021-01-10 DIAGNOSIS — E1169 Type 2 diabetes mellitus with other specified complication: Secondary | ICD-10-CM

## 2021-01-10 DIAGNOSIS — Z952 Presence of prosthetic heart valve: Secondary | ICD-10-CM

## 2021-01-10 DIAGNOSIS — J449 Chronic obstructive pulmonary disease, unspecified: Secondary | ICD-10-CM

## 2021-01-10 NOTE — Patient Instructions (Signed)

## 2021-01-10 NOTE — Progress Notes (Signed)
Cardiology Office Note:    Date:  01/10/2021   ID:  Shannon Schultz, DOB 1945/10/04, MRN [id number]  PCP:  [name], MD  Cardiologist:   Sanda Klein, MD Electrophysiologist:  None  Referring MD: Sanda Klein, MD   Chief Complaint  Patient presents with   Cardiac Valve Problem    History of Present Illness:    Shannon Schultz is a 75 y.o. female with a hx of aortic valve stenosis s/p TAVR (February 2022, 26 mm Edwards Sapien 3 Ultra, L subclavian artery approach), mild nonobstructive CAD (cath January 2022), hypertension, aortic atherosclerosis and 3 cm AAA (very tortuous abdominal aorta with atherosclerosis), severe obesity and COPD/asthma.    Although her dyspnea has improved (can now go with walker to mailbox and back) it has not resolved completely (residual lung disease and obesity). She has not had bleeding issues or falls, angina, palpitations, orthopnea/PND or angina. Mild edema is worse at end of day, resolves by morning.  Has occasional palpitations but they are not particularly bothersome and do not associate dizziness or presyncope.  Labs from April show good lipid and glycemic control parameters.  Past Medical History:  Diagnosis Date   Arthritis    "hands" (05/15/2017)   Bladder cancer ([place]) 2018   Breast cancer, right ([place]) 1992   DCIS,bladder ca (just dx)   Colon polyp    Complication of anesthesia 1992   "local anesthesia" used was hard to awaken from-no problems since (05/15/2017)   COPD (chronic obstructive pulmonary disease) ([place])    Coronary artery disease    COVID-19 virus infection 05/05/2019   Diverticulosis    Elevated liver enzymes    fatty liver per ultrasound per pt   FHx: BRCA2 gene positive    sister with BRCA2 mutation (pt tested NEGATIVE)   HLD (hyperlipidemia)    HSV (herpes simplex virus) infection    on hip--on daily suppression   Hypertension    Hypothyroidism    took med 7 yrs after birth of 1st child   Impaired glucose tolerance     Migraine    On home oxygen therapy    "have it available but I'm not using it" (05/15/2017)   Osteopenia    S/P TAVR (transcatheter aortic valve replacement) 08/14/2020   s/p TAVR with a 26 mm Edwards S3U via the left subclavian approach by Dr. Roxy Manns and Dr. Angelena Form   Severe aortic stenosis    Sleep apnea    Sleeps with 2 L O2 @ night   Type 2 diabetes mellitus ([place])    Ventral hernia    Vitamin D deficiency disease     Past Surgical History:  Procedure Laterality Date   ABDOMINAL HERNIA REPAIR  05/15/2017   BREAST BIOPSY Right 1992   BREAST BIOPSY Left 2018   BREAST EXCISIONAL BIOPSY Left 2018   ATYPICAL DUCTAL HYPERPLASIA INVOLVING A COMPLEX   BREAST LUMPECTOMY WITH RADIOACTIVE SEED LOCALIZATION Left 12/22/2016   Procedure: LEFT BREAST LUMPECTOMY WITH RADIOACTIVE SEED LOCALIZATION;  Surgeon: Jovita Kussmaul, MD;  Location: [facility];  Service: General;  Laterality: Left;   CARDIAC CATHETERIZATION     CATARACT EXTRACTION, BILATERAL Bilateral R 03/16/2019 L 03/30/2019   Dr. Kathlen Mody   COLONOSCOPY  2009, 08/2010, 12/2015   Dr. Collene Mares; "only 1 had any polyps" (05/15/2017)   CYSTOSCOPY  04/23/2017   CYSTOSCOPY W/ RETROGRADES Bilateral 01/12/2017   Procedure: CYSTOSCOPY WITH RETROGRADE PYELOGRAM/ EXAM UNDER ANESTHESIA;  Surgeon: Raynelle Bring, MD;  Location: [facility];  Service:  Urology;  Laterality: Bilateral;   EYE SURGERY     FEMUR IM NAIL Right 08/24/2019   Procedure: INTRAMEDULLARY (IM) NAIL FEMORAL;  Surgeon: Rod Can, MD;  Location: [facility];  Service: Orthopedics;  Laterality: Right;   HERNIA REPAIR     INSERTION OF MESH N/A 05/15/2017   Procedure: INSERTION OF MESH;  Surgeon: Jovita Kussmaul, MD;  Location: [facility];  Service: General;  Laterality: N/A;   LAPAROSCOPIC CHOLECYSTECTOMY  2003   MASTECTOMY Right 1992   RIGHT/LEFT HEART CATH AND CORONARY ANGIOGRAPHY N/A 07/05/2020   Procedure: RIGHT/LEFT HEART CATH AND CORONARY ANGIOGRAPHY;  Surgeon: Burnell Blanks, MD;  Location:  [facility];  Service: Cardiovascular;  Laterality: N/A;   TEE WITHOUT CARDIOVERSION N/A 08/14/2020   Procedure: TRANSESOPHAGEAL ECHOCARDIOGRAM (TEE);  Surgeon: Burnell Blanks, MD;  Location: [facility];  Service: Open Heart Surgery;  Laterality: N/A;   TRANSURETHRAL RESECTION OF BLADDER TUMOR WITH MITOMYCIN-C N/A 01/12/2017   Procedure: TRANSURETHRAL RESECTION OF BLADDER TUMOR WITH POSSIBLE POST OPERATIVE INSTILLATION OF MITOMYCIN-C;  Surgeon: Raynelle Bring, MD;  Location: [facility];  Service: Urology;  Laterality: N/A;   TYMPANOSTOMY TUBE PLACEMENT Bilateral [id number]   UMBILICAL HERNIA REPAIR  2003   VENTRAL HERNIA REPAIR N/A 05/15/2017   Procedure: VENTRAL HERNIA REPAIR WITH MESH;  Surgeon: Jovita Kussmaul, MD;  Location: [facility];  Service: General;  Laterality: N/A;    Current Medications: Current Meds  Medication Sig   ACCU-CHEK AVIVA PLUS test strip TEST BLOOD SUGAR ONE TO TWO TIMES DAILY   Accu-Chek Softclix Lancets lancets Test BS 1-2 times daily. Pt uses accu-chek meter   acetaminophen (TYLENOL) 500 MG tablet Take 1,000 mg by mouth every 6 (six) hours as needed for moderate pain or headache.   albuterol (PROVENTIL) (2.5 MG/3ML) 0.083% nebulizer solution USE 1 VIAL BY NEBULIZATION EVERY FOUR HOURS AS NEEDED FOR WHEEZING OR SHORTNESS OF BREATH.   albuterol (VENTOLIN HFA) 108 (90 Base) MCG/ACT inhaler Inhale 2 puffs into the lungs every 6 (six) hours as needed for wheezing or shortness of breath.   Alcohol Swabs (B-D SINGLE USE SWABS REGULAR) PADS Use twice a day when checking blood sugars   aspirin EC 81 MG tablet Take 81 mg daily by mouth.   bisoprolol-hydrochlorothiazide (ZIAC) 10-6.25 MG tablet Take 1 tablet by mouth daily.   Calcium-Magnesium-Zinc (CAL-MAG-ZINC PO) Take 1 tablet by mouth daily.   cetirizine (ZYRTEC) 10 MG tablet Take 10 mg by mouth daily.   clopidogrel (PLAVIX) 75 MG tablet Take 1 tablet (75 mg total) by mouth daily with breakfast.   furosemide (LASIX) 20 MG  tablet TAKE 1 TABLET (20 MG TOTAL) BY MOUTH DAILY AS NEEDED FOR FLUID OR EDEMA.   guaiFENesin (MUCINEX) 600 MG 12 hr tablet Take 600 mg by mouth 2 (two) times daily.   metFORMIN (GLUCOPHAGE-XR) 500 MG 24 hr tablet TAKE 1 TABLET THREE TIMES DAILY   methocarbamol (ROBAXIN) 500 MG tablet Take 1-2 tablets (500-1,000 mg total) by mouth every 8 (eight) hours as needed for muscle spasms.   montelukast (SINGULAIR) 10 MG tablet Take 1 tablet (10 mg total) by mouth at bedtime.   simvastatin (ZOCOR) 20 MG tablet Take 1 tablet (20 mg total) by mouth at bedtime.   triamcinolone cream (KENALOG) 0.1 % Apply 1 application topically as needed.   TRUEplus Lancets 30G MISC TEST BLOOD SUGAR ONE TO TWO TIMES DAILY   umeclidinium-vilanterol (ANORO ELLIPTA) 62.5-25 MCG/INH AEPB Inhale 1 puff into the lungs daily.  valACYclovir (VALTREX) 500 MG tablet Take 1 tablet (500 mg total) by mouth daily.     Allergies:   Contrast media [iodinated diagnostic agents], Iohexol, Lisinopril, Sulfa antibiotics, Latex, Nickel, Codeine, Levofloxacin, Lipitor [atorvastatin calcium], and Vicodin [hydrocodone-acetaminophen]   Social History   Socioeconomic History   Marital status: Married    Spouse name: Not on file   Number of children: 3   Years of education: Not on file   Highest education level: Not on file  Occupational History   Occupation: Retired  Tobacco Use   Smoking status: Former    Packs/day: 1.00    Years: 46.00    Pack years: 46.00    Types: Cigarettes    Quit date: 05/31/2011    Years since quitting: 9.6   Smokeless tobacco: Never  Vaping Use   Vaping Use: Never used  Substance and Sexual Activity   Alcohol use: No   Drug use: No   Sexual activity: Yes    Partners: Male    Birth control/protection: Post-menopausal  Other Topics Concern   Not on file  Social History Narrative   Lives with her husband.  Children all live in [place] nearby. No pets. 6 grandchildren.   Social Determinants of Health    Financial Resource Strain: Not on file  Food Insecurity: Not on file  Transportation Needs: Not on file  Physical Activity: Not on file  Stress: Not on file  Social Connections: Not on file     Family History: The patient's family history includes Allergies in her sister and sister; Asthma in her sister and sister; Breast cancer (age of onset: 44) in her sister; Cancer in her maternal grandmother and another family member; Diabetes in her father; Fibromyalgia in her sister and sister; Hashimoto's thyroiditis in her sister and sister; Heart disease in her father and mother; Hyperlipidemia in her sister and sister; Hypertension in her father; Stroke (age of onset: 89) in her paternal grandmother; Tuberculosis in her maternal grandfather.  ROS:   Please see the history of present illness.    All other systems are reviewed and are negative.   EKGs/Labs/Other Studies Reviewed:    The following studies were reviewed today:  CATH 07/05/2020 Mid Cx lesion is 30% stenosed. Mid LAD lesion is 20% stenosed.   Mild non-obstructive CAD Severe aortic stenosis by echo. Cath data with mean gradient of 34.6 mmHg, peak to peak gradient 44 mmHg).  ECHO 09/12/2020  1. Left ventricular ejection fraction, by estimation, is 65 to 70%. The  left ventricle has normal function. The left ventricle has no regional  wall motion abnormalities. There is mild left ventricular hypertrophy.  Left ventricular diastolic parameters  are consistent with Grade I diastolic dysfunction (impaired relaxation).   2. Right ventricular systolic function is normal. The right ventricular  size is normal. There is mildly elevated pulmonary artery systolic  pressure. The estimated right ventricular systolic pressure is 88.5 mmHg.   3. The mitral valve is normal in structure. Trivial mitral valve  regurgitation.   4. The inferior vena cava is normal in size with greater than 50%  respiratory variability, suggesting right  atrial pressure of 3 mmHg.   5. There is a 26 mm Edwards Sapien prosthetic, stented (TAVR) valve  present in the aortic position. Aortic valve regurgitation is not  visualized. Echo findings are consistent with normal structure and  function of the aortic valve prosthesis. Vmax 2.5  m/s, AT 21m, MG 12 mmHg, EOA 1.4 cm^2, DI 0.4  Event monitor 10/03/2020  The baseline rhythm is normal sinus rhythm with normal circadian variation. Rare PACs and PVCs are seen. There are rare episodes of nonsustained supraventricular tachycardia and nonsustained ventricular tachycardia. There are no sustained arrhythmia, no atrial fibrillation and no bradycardia events. Patient symptoms are more commonly associated with VT and PVC events, but also with one SVT event.   Abnormal event monitor due to rare and brief nonsustained atrial tachycardia and ventricular tachycardia. Symptoms are most commonly attributable to nonsustained VT or periods of frequent isolated PVCs.    EKG:  EKG is  ordered today.  Personally reviewed shows sinus rhythm with mild first-degree AV block (PR interval 214 ms) and rare PVCs, atypical left bundle branch block, QRS 134 ms  Recent Labs: 04/23/2020: TSH 3.150 08/15/2020: Hemoglobin 11.3; Magnesium 1.9; Platelets 117 10/22/2020: ALT 23; BUN 20; Creatinine, Ser 0.57; Potassium 4.2; Sodium 143  Recent Lipid Panel    Component Value Date/Time   CHOL 130 10/22/2020 0840   TRIG 86 10/22/2020 0840   HDL 54 10/22/2020 0840   CHOLHDL 2.4 10/22/2020 0840   CHOLHDL 2.2 08/18/2016 1346   VLDL 19 08/18/2016 1346   LDLCALC 59 10/22/2020 0840    Physical Exam:    VS:  BP (!) 150/70   Pulse 85   Ht '5\' 4"'  (1.626 m)   Wt 231 lb 12.8 oz (105.1 kg)   LMP  (LMP Unknown)   SpO2 92%   BMI 39.79 kg/m     Wt Readings from Last 3 Encounters:  01/10/21 231 lb 12.8 oz (105.1 kg)  10/25/20 228 lb 9.6 oz (103.7 kg)  10/23/20 229 lb (103.9 kg)     General: Alert, oriented x3, no  distress, severely obese Head: no evidence of trauma, PERRL, EOMI, no exophtalmos or lid lag, no myxedema, no xanthelasma; normal ears, nose and oropharynx Neck: normal jugular venous pulsations and no hepatojugular reflux; brisk carotid pulses without delay and no carotid bruits Chest: clear to auscultation, no signs of consolidation by percussion or palpation, normal fremitus, symmetrical and full respiratory excursions Cardiovascular: normal position and quality of the apical impulse, regular rhythm, normal first and second heart sounds, 1-2/6 aortic ejection murmur is early peaking, no diastolic murmurs, rubs or gallops Abdomen: no tenderness or distention, no masses by palpation, no abnormal pulsatility or arterial bruits, normal bowel sounds, no hepatosplenomegaly Extremities: no clubbing, cyanosis or edema; 2+ radial, ulnar and brachial pulses bilaterally; 2+ right femoral, posterior tibial and dorsalis pedis pulses; 2+ left femoral, posterior tibial and dorsalis pedis pulses; no subclavian or femoral bruits Neurological: grossly nonfocal Psych: Normal mood and affect    ASSESSMENT:    1. Nonrheumatic aortic valve stenosis   2. S/P TAVR (transcatheter aortic valve replacement)   3. Aortic atherosclerosis (HCC)   4. Class 2 severe obesity due to excess calories with serious comorbidity and body mass index (BMI) of 39.0 to 39.9 in adult ([place])   5. Diabetes mellitus type 2 in obese ([place])   6. Hypercholesterolemia   7. Essential hypertension, benign   8. COPD, group B, by GOLD 2017 classification ([place])     PLAN:    In order of problems listed above:  AS: Successful TAVR without any serious complications.  Understands the need for endocarditis prophylaxis for dental procedures.  Continue clopidogrel through August and then remain on aspirin monotherapy.  Periodic echo follow-up. Aortic atherosclerosis: Really not identified as an aneurysm by the most recent CT performed in January  2022.  She just had extensive tortuosity of the abdominal aorta with atherosclerosis.  No plan for routine imaging follow-up. Obesity: She had made good progress with weight loss dropping her BMI from 43 to 36 before the pandemic, but has now gained back weight and has a BMI of 39.. Successful additional weight loss between now and the TAVR procedure would allow for a lower risk of complications and quicker recovery. DM: Good glycemic control. HLP: Excellent lipid parameters on the current statin.  Continue. HTN: As on previous occasions, her blood pressure is a little high in the doctor's office, but at home is in the 120-130 range COPD: Reported prior FEV1 of 52%.  Probably explains residual dyspnea Imaging studies suggest cirrhosis: No known history of liver disease, normal albumin and prothrombin time on recent labs, no signs or symptoms of decompensated liver function or portal hypertension.  Suspect that she may have NASH.   Medication Adjustments/Labs and Tests Ordered: Current medicines are reviewed at length with the patient today.  Concerns regarding medicines are outlined above.  Orders Placed This Encounter  Procedures   EKG 12-Lead    No orders of the defined types were placed in this encounter.   Patient Instructions  Medication Instructions:  No changes *If you need a refill on your cardiac medications before your next appointment, please call your pharmacy*   Lab Work: None ordered If you have labs (blood work) drawn today and your tests are completely normal, you will receive your results only by: [place] (if you have MyChart) OR A paper copy in the mail If you have any lab test that is abnormal or we need to change your treatment, we will call you to review the results.   Testing/Procedures: None ordered   Follow-Up: At [facility], you and your health needs are our priority.  As part of our continuing mission to provide you with exceptional heart  care, we have created designated Provider Care Teams.  These Care Teams include your primary Cardiologist (physician) and Advanced Practice Providers (APPs -  Physician Assistants and Nurse Practitioners) who all work together to provide you with the care you need, when you need it.  We recommend signing up for the patient portal called "MyChart".  Sign up information is provided on this After Visit Summary.  MyChart is used to connect with patients for Virtual Visits (Telemedicine).  Patients are able to view lab/test results, encounter notes, upcoming appointments, etc.  Non-urgent messages can be sent to your provider as well.   To learn more about what you can do with MyChart, go to [email or web address].    Your next appointment:   12 month(s)  The format for your next appointment:   In Person  Provider:   You may see Sanda Klein, MD or one of the following Advanced Practice Providers on your designated Care Team:   Almyra Deforest, PA-C Fabian Sharp, [place] or  Roby Lofts, PA-C    Signed, Sanda Klein, MD  01/10/2021 1:27 PM    [place]

## 2021-01-29 ENCOUNTER — Other Ambulatory Visit: Payer: Self-pay

## 2021-01-29 ENCOUNTER — Ambulatory Visit (INDEPENDENT_AMBULATORY_CARE_PROVIDER_SITE_OTHER): Payer: Medicare HMO | Admitting: Family Medicine

## 2021-01-29 VITALS — BP 148/72 | HR 79 | Temp 98.1°F | Wt 233.4 lb

## 2021-01-29 DIAGNOSIS — N3001 Acute cystitis with hematuria: Secondary | ICD-10-CM

## 2021-01-29 DIAGNOSIS — C689 Malignant neoplasm of urinary organ, unspecified: Secondary | ICD-10-CM

## 2021-01-29 DIAGNOSIS — R399 Unspecified symptoms and signs involving the genitourinary system: Secondary | ICD-10-CM

## 2021-01-29 LAB — POCT URINALYSIS DIP (PROADVANTAGE DEVICE)
Bilirubin, UA: NEGATIVE
Glucose, UA: NEGATIVE mg/dL
Ketones, POC UA: NEGATIVE mg/dL
Nitrite, UA: NEGATIVE
Specific Gravity, Urine: 1.02
Urobilinogen, Ur: 0.2
pH, UA: 6 (ref 5.0–8.0)

## 2021-01-29 MED ORDER — DOXYCYCLINE HYCLATE 100 MG PO TABS
100.0000 mg | ORAL_TABLET | Freq: Two times a day (BID) | ORAL | 0 refills | Status: DC
Start: 2021-01-29 — End: 2021-03-29

## 2021-01-29 NOTE — Progress Notes (Signed)
   Subjective:    Patient ID: Shannon Schultz, female    DOB: Jan 12, 1946, 75 y.o.   MRN: [id number]  HPI She complains of a several day history of frequency and dysuria but no fever, chills, abdominal pain.  Does have a previous history of bladder cancer and apparently was seen on a yearly basis for that.  She states that her last UTI was when she was in her 53s.   Review of Systems     Objective:   Physical Exam Alert and in no distress.  Urine microscopic did show red cells and white cells.       Assessment & Plan:  Acute cystitis with hematuria  UTI symptoms - Plan: POCT Urinalysis DIP (Proadvantage Device)  Urothelial cancer ([place]) I will call in doxycycline as she does have multiple allergies.  She is also return here in 2 weeks for repeat urinalysis especially concerning her hematuria and previous history of cancer.  She was comfortable with that.

## 2021-02-10 ENCOUNTER — Other Ambulatory Visit: Payer: Self-pay | Admitting: Physician Assistant

## 2021-02-11 ENCOUNTER — Telehealth: Payer: Self-pay | Admitting: Cardiovascular Disease

## 2021-02-11 NOTE — Telephone Encounter (Signed)
Patient has been made aware that she is correct that she can stop the Plavix but stay on the daily aspirin. Plavix has been taken off of her list.  Per last office note:  AS: Successful TAVR without any serious complications.  Understands the need for endocarditis prophylaxis for dental procedures.  Continue clopidogrel through August and then remain on aspirin monotherapy.  Periodic echo follow-up

## 2021-02-11 NOTE — Telephone Encounter (Signed)
Hey, looks like a refill was sent in today- I reviewed the last note but did not see anything about stopping the Plavix. Wanted to make sure I did not over look.  Can you please clarify if okay to continue?  Thanks!

## 2021-02-11 NOTE — Telephone Encounter (Signed)
Pt c/o medication issue:  1. Name of Medication:  clopidogrel (PLAVIX) 75 MG tablet  2. How are you currently taking this medication (dosage and times per day)? As prescribed, took last tablet today   3. Are you having a reaction (difficulty breathing--STAT)? No   4. What is your medication issue? Shannon Schultz is calling wanting to know if she is supposed to continue this medication. She states she received a message in regards to a refill on this, but was advised at her last appointment she can stop the medication once she ran out. Please advise.

## 2021-02-12 ENCOUNTER — Other Ambulatory Visit: Payer: Self-pay

## 2021-02-12 ENCOUNTER — Other Ambulatory Visit (INDEPENDENT_AMBULATORY_CARE_PROVIDER_SITE_OTHER): Payer: Medicare HMO

## 2021-02-12 DIAGNOSIS — R399 Unspecified symptoms and signs involving the genitourinary system: Secondary | ICD-10-CM | POA: Diagnosis not present

## 2021-02-12 LAB — POCT URINALYSIS DIP (PROADVANTAGE DEVICE)
Bilirubin, UA: NEGATIVE
Blood, UA: NEGATIVE
Glucose, UA: 100 mg/dL — AB
Ketones, POC UA: NEGATIVE mg/dL
Leukocytes, UA: NEGATIVE
Nitrite, UA: NEGATIVE
Protein Ur, POC: NEGATIVE mg/dL
Specific Gravity, Urine: 1.03
Urobilinogen, Ur: NEGATIVE
pH, UA: 6 (ref 5.0–8.0)

## 2021-02-18 DIAGNOSIS — M7632 Iliotibial band syndrome, left leg: Secondary | ICD-10-CM | POA: Diagnosis not present

## 2021-02-18 DIAGNOSIS — M7631 Iliotibial band syndrome, right leg: Secondary | ICD-10-CM | POA: Diagnosis not present

## 2021-02-18 DIAGNOSIS — S72141D Displaced intertrochanteric fracture of right femur, subsequent encounter for closed fracture with routine healing: Secondary | ICD-10-CM | POA: Diagnosis not present

## 2021-03-05 DIAGNOSIS — M25551 Pain in right hip: Secondary | ICD-10-CM | POA: Diagnosis not present

## 2021-03-12 DIAGNOSIS — I8312 Varicose veins of left lower extremity with inflammation: Secondary | ICD-10-CM | POA: Diagnosis not present

## 2021-03-12 DIAGNOSIS — D485 Neoplasm of uncertain behavior of skin: Secondary | ICD-10-CM | POA: Diagnosis not present

## 2021-03-12 DIAGNOSIS — L309 Dermatitis, unspecified: Secondary | ICD-10-CM | POA: Diagnosis not present

## 2021-03-12 DIAGNOSIS — M25551 Pain in right hip: Secondary | ICD-10-CM | POA: Diagnosis not present

## 2021-03-12 DIAGNOSIS — D2271 Melanocytic nevi of right lower limb, including hip: Secondary | ICD-10-CM | POA: Diagnosis not present

## 2021-03-12 DIAGNOSIS — I872 Venous insufficiency (chronic) (peripheral): Secondary | ICD-10-CM | POA: Diagnosis not present

## 2021-03-12 DIAGNOSIS — I8311 Varicose veins of right lower extremity with inflammation: Secondary | ICD-10-CM | POA: Diagnosis not present

## 2021-03-15 DIAGNOSIS — M25551 Pain in right hip: Secondary | ICD-10-CM | POA: Diagnosis not present

## 2021-03-19 ENCOUNTER — Telehealth: Payer: Self-pay | Admitting: Family Medicine

## 2021-03-19 DIAGNOSIS — M25551 Pain in right hip: Secondary | ICD-10-CM | POA: Diagnosis not present

## 2021-03-19 NOTE — Chronic Care Management (AMB) (Signed)
  Chronic Care Management   Outreach Note  03/19/2021 Name: Shannon Schultz MRN: [id number] DOB: [birth year]  Referred by: [name], MD Reason for referral : No chief complaint on file.   An unsuccessful telephone outreach was attempted today. The patient was referred to the pharmacist for assistance with care management and care coordination.   Follow Up Plan:   [name] Dellinger Upstream Scheduler

## 2021-03-21 ENCOUNTER — Telehealth: Payer: Self-pay | Admitting: Family Medicine

## 2021-03-21 NOTE — Progress Notes (Signed)
  Chronic Care Management   Note  03/21/2021 Name: [name] MRN: [id number] DOB: [birth year]  Shannon Schultz is a 75 y.o. year old female who is a primary care patient of [name], MD. I reached out to [name] by phone today in response to a referral sent by [name]'s PCP, [name], MD.   [name] was given information about Chronic Care Management services today including:  CCM service includes personalized support from designated clinical staff supervised by her physician, including individualized plan of care and coordination with other care providers 24/7 contact phone numbers for assistance for urgent and routine care needs. Service will only be billed when office clinical staff spend 20 minutes or more in a month to coordinate care. Only one practitioner may furnish and bill the service in a calendar month. The patient may stop CCM services at any time (effective at the end of the month) by phone call to the office staff.   Patient agreed to services and verbal consent obtained.   Follow up plan:   [name] Secretary/administrator

## 2021-03-22 DIAGNOSIS — M25551 Pain in right hip: Secondary | ICD-10-CM | POA: Diagnosis not present

## 2021-03-25 DIAGNOSIS — M25551 Pain in right hip: Secondary | ICD-10-CM | POA: Diagnosis not present

## 2021-03-28 DIAGNOSIS — M25551 Pain in right hip: Secondary | ICD-10-CM | POA: Diagnosis not present

## 2021-03-29 ENCOUNTER — Other Ambulatory Visit: Payer: Self-pay

## 2021-03-29 ENCOUNTER — Encounter: Payer: Self-pay | Admitting: Pulmonary Disease

## 2021-03-29 ENCOUNTER — Ambulatory Visit: Payer: Medicare HMO | Admitting: Pulmonary Disease

## 2021-03-29 VITALS — BP 128/62 | HR 91 | Temp 98.4°F | Ht 64.0 in | Wt 231.0 lb

## 2021-03-29 DIAGNOSIS — G4733 Obstructive sleep apnea (adult) (pediatric): Secondary | ICD-10-CM | POA: Diagnosis not present

## 2021-03-29 DIAGNOSIS — J309 Allergic rhinitis, unspecified: Secondary | ICD-10-CM | POA: Diagnosis not present

## 2021-03-29 DIAGNOSIS — J449 Chronic obstructive pulmonary disease, unspecified: Secondary | ICD-10-CM

## 2021-03-29 MED ORDER — ANORO ELLIPTA 62.5-25 MCG/INH IN AEPB: 1.0000 | INHALATION_SPRAY | Freq: Every day | RESPIRATORY_TRACT | 0 refills | Status: DC

## 2021-03-29 NOTE — Progress Notes (Signed)
Subjective:   PATIENT ID: Shannon Schultz GENDER: female DOB: 10/24/1945, MRN: [id number]   HPI  Chief Complaint  Patient presents with   Follow-up   Shannon Schultz is a 75 year old female with allergic rhinitis moderate COPD, HTN, DM2, AS s/p TAVR 07/2020 who presents for follow-up.  Synopsis: Diagnosis of COPD well-controlled on Anoro. Last COPD exacerbation in 2019.   03/29/21 She is well-controlled on Anoro. No steroids for respiratory issues however did complete prednisone for rash related to meloxicam. Denies shortness of breath, wheezing or cough. Has allergies with intermittent cough which she is on Singulair and over the counter. She is compliant with her oxygen at night. She randomly checks her oxygen levels and it reads 98%.   She is concerned about her insurance approving her Anoro for 2023.  Social History: 46 pack years. Quit in 2012. During 2018, she lost her sister to breast cancer in early 2018, was diagnosed with breast cancer s/p lumpectomy, had bladder cancer and underwent resection and intravesicular chemo, fell and broke her right shoulder, underwent ventral hernia repair with mesh insertion.   Patient Active Problem List   Diagnosis Date Noted   Nocturnal hypoxemia 11/12/2020   S/P TAVR (transcatheter aortic valve replacement) 08/14/2020   Severe aortic stenosis    Contrast media allergy 06/26/2020   AAA (abdominal aortic aneurysm) without rupture (HCC) 06/26/2020   Upper airway cough syndrome 12/20/2019   Hip fracture ([place]) 08/25/2019   Venous insufficiency 09/10/2018   Sinusitis 06/25/2018   Ophthalmic migraine 03/16/2018   COPD, group B, by GOLD 2017 classification ([place]) 09/30/2017   Chronic hypoxemic respiratory failure ([place]) 09/29/2017   Ventral hernia without obstruction or gangrene 05/15/2017   Cirrhosis of liver without ascites ([place]) 03/11/2017   Urothelial cancer ([place]) 03/10/2017   Atherosclerosis of coronary artery 09/14/2016   Aortic  atherosclerosis ([place]) 09/14/2016   Class 2 severe obesity due to excess calories with serious comorbidity and body mass index (BMI) of 39.0 to 39.9 in adult ([place]) 11/10/2015   Thrombocytopenia ([place]) 08/17/2015   Elevated troponin 08/17/2015   Diabetes mellitus type 2 in obese ([place]) 08/17/2015   Severe obesity (BMI >= 40) ([place]) 10/06/2014   FHx: BRCA2 gene positive    HSV (herpes simplex virus) infection 06/07/2012   Pure hypercholesterolemia 11/13/2010   Essential hypertension, benign 11/13/2010    Outpatient Medications Prior to Visit  Medication Sig Dispense Refill   ACCU-CHEK AVIVA PLUS test strip TEST BLOOD SUGAR ONE TO TWO TIMES DAILY 200 strip 2   Accu-Chek Softclix Lancets lancets Test BS 1-2 times daily. Pt uses accu-chek meter 100 each 1   acetaminophen (TYLENOL) 500 MG tablet Take 1,000 mg by mouth every 6 (six) hours as needed for moderate pain or headache.     albuterol (PROVENTIL) (2.5 MG/3ML) 0.083% nebulizer solution USE 1 VIAL BY NEBULIZATION EVERY FOUR HOURS AS NEEDED FOR WHEEZING OR SHORTNESS OF BREATH. 30 mL 0   albuterol (VENTOLIN HFA) 108 (90 Base) MCG/ACT inhaler Inhale 2 puffs into the lungs every 6 (six) hours as needed for wheezing or shortness of breath. 8 g 5   Alcohol Swabs (B-D SINGLE USE SWABS REGULAR) PADS Use twice a day when checking blood sugars 100 each 1   amoxicillin (AMOXIL) 500 MG capsule Take 4 tablets (2,064m total) by mouth 1 hour prior to dental work (Patient not taking: No sig reported) 4 capsule 3   aspirin EC 81 MG tablet Take 81 mg daily by mouth.  bisoprolol-hydrochlorothiazide (ZIAC) 10-6.25 MG tablet Take 1 tablet by mouth daily. 90 tablet 1   Calcium-Magnesium-Zinc (CAL-MAG-ZINC PO) Take 1 tablet by mouth daily.     cetirizine (ZYRTEC) 10 MG tablet Take 10 mg by mouth daily.     doxycycline (VIBRA-TABS) 100 MG tablet Take 1 tablet (100 mg total) by mouth 2 (two) times daily. 28 tablet 0   furosemide (LASIX) 20 MG tablet TAKE 1 TABLET  (20 MG TOTAL) BY MOUTH DAILY AS NEEDED FOR FLUID OR EDEMA. 30 tablet 0   guaiFENesin (MUCINEX) 600 MG 12 hr tablet Take 600 mg by mouth 2 (two) times daily.     metFORMIN (GLUCOPHAGE-XR) 500 MG 24 hr tablet TAKE 1 TABLET THREE TIMES DAILY 270 tablet 1   methocarbamol (ROBAXIN) 500 MG tablet Take 1-2 tablets (500-1,000 mg total) by mouth every 8 (eight) hours as needed for muscle spasms. 30 tablet 0   montelukast (SINGULAIR) 10 MG tablet Take 1 tablet (10 mg total) by mouth at bedtime. 30 tablet 11   simvastatin (ZOCOR) 20 MG tablet Take 1 tablet (20 mg total) by mouth at bedtime. 90 tablet 1   triamcinolone cream (KENALOG) 0.1 % Apply 1 application topically as needed.     TRUEplus Lancets 30G MISC TEST BLOOD SUGAR ONE TO TWO TIMES DAILY 200 each 2   umeclidinium-vilanterol (ANORO ELLIPTA) 62.5-25 MCG/INH AEPB Inhale 1 puff into the lungs daily. 14 each 0   valACYclovir (VALTREX) 500 MG tablet Take 1 tablet (500 mg total) by mouth daily. 90 tablet 3   No facility-administered medications prior to visit.    Review of Systems  Constitutional:  Negative for chills, diaphoresis, fever, malaise/fatigue and weight loss.  HENT:  Negative for congestion.   Respiratory:  Positive for cough. Negative for hemoptysis, sputum production, shortness of breath and wheezing.   Cardiovascular:  Negative for chest pain, palpitations and leg swelling.   Objective:   Vitals:   03/29/21 0947  BP: 128/62  Pulse: 91  Temp: 98.4 F (36.9 C)  TempSrc: Oral  SpO2: 93%  Weight: 231 lb (104.8 kg)  Height: '5\' 4"'  (1.626 m)       Body mass index is 39.65 kg/m.  Physical Exam: General: Well-appearing, no acute distress HENT: [place], AT Eyes: EOMI, no scleral icterus Respiratory: Clear to auscultation bilaterally.  No crackles, wheezing or rales Cardiovascular: RRR, -M/R/G, no JVD Extremities:-Edema,-tenderness Neuro: AAO x4, CNII-XII grossly intact Psych: Normal mood, normal affect  Data  Reviewed:  Imaging Screening CT chest 09/10/16 -moderate emphysema Screening CT 09/16/17-moderate emphysema, scattered subcentimeter pulmonary nodules.  Fatty liver with findings of cirrhosis. Screening CT 11/29/18- interval development of RUL nodule measuring 6.1 mm CT Chest 03/03/19 - slightly decreased RUL nodule measuring 5.3 mm Screening CT chest 03/26/20 - centrilobular emphysema. Scattered tiny pulmonary nodules, stable CTA 07/13/20 Stable pulmonary nodules  PFTs 08/28/11 FVC 2.71 [92%], FEV1 1.33 (62%), F/F 49, TLC 114%, DLCO 46% Moderate obstructive defect with moderate reduction in diffusion capacity. No bronchodilator response   11/08/15 FVC 2.05 [7%), FEV1 1.23 (55%), F/F 60 Moderate obstructive defect   09/24/16 FVC 2. (82%), FEV1 1.43 (62%), F/F 58 , TLC 99%, DLCO 54% Moderate obstruction with moderate diffusion defect. No bronchodilator response.   Labs A1AT 08/08/16- 162, PIMM  Sleep study Home sleep study 05/03/19 - AHI 8.8 Nadir SpO2 76%     Assessment & Plan:   75 year old female with allergic rhinitis moderate COPD, OSA not on CPAP, HTN, DM2, AS s/p TAVR  07/2020 who presents for follow-up  Moderate COPD GOLD A/B - well-controlled --CONTINUE Anoro ONE puff ONCE a day. Samples provided --CONTINUE Albuterol AS NEEDED for shortness of breath or wheezing  Allergic Rhinitis --CONTINUE Zyrtec --CONTINUE Singulair --STOP Flonase due to nose bleeds. Call office for atrovent nasal spray if congestion recurs   Nocturnal hypoxemic respiratory failure secondary to mild OSA  Discussed CPAP vs oral appliance vs weight loss. Prefers to continue oxygen. No symptoms of fatigue, shortness of breath, headaches. Overall asymptomatic. --CONTINUE 2L O2 oxygen via nasal cannula nightly    Health maintenance  Immunization History  Administered Date(s) Administered   Fluad Quad(high Dose 65+) 04/07/2019, 04/26/2020   Influenza Split 05/28/2011   Influenza, High Dose Seasonal PF  04/07/2013, 05/15/2014, 05/30/2015, 03/25/2016, 03/05/2017, 04/01/2018   Influenza, Seasonal, Injecte, Preservative Fre 06/07/2012   PFIZER Comirnaty(Gray Top)Covid-19 Tri-Sucrose Vaccine 10/25/2020   PFIZER(Purple Top)SARS-COV-2 Vaccination 08/20/2019, 09/13/2019, 04/14/2020   Pneumococcal Conjugate-13 07/06/2014   Pneumococcal Polysaccharide-23 12/15/2004, 05/28/2011   Tdap 02/23/2008, 06/15/2018   Zoster Recombinat (Shingrix) 12/02/2017, 02/06/2018   Zoster, Live 05/17/2010   Annual CT Lung - Last CT 03/26/20. Scattered tiny pulmonary nodules, stable  No orders of the defined types were placed in this encounter.  No orders of the defined types were placed in this encounter.  Return in about 3 months (around 06/28/2021).  I have spent a total time of 32-minutes on the day of the appointment reviewing prior documentation, coordinating care and discussing medical diagnosis and plan with the patient/family. Past medical history, allergies, medications were reviewed. Pertinent imaging, labs and tests included in this note have been reviewed and interpreted independently by me.  Tresean Mattix Rodman Pickle, MD [facility] 03/29/2021 8:07 AM

## 2021-03-29 NOTE — Patient Instructions (Signed)
Moderate COPD GOLD A/B - well-controlled --CONTINUE Anoro ONE puff ONCE a day. SAMPLES provided.  --CONTINUE Albuterol AS NEEDED for shortness of breath or wheezing  Allergic Rhinitis --CONTINUE Zyrtec --CONTINUE Singulair --STOP Flonase due to nose bleeds. Call office for atrovent nasal spray if congestion recurs   Nocturnal hypoxemic respiratory failure secondary to mild OSA  Discussed CPAP vs oral appliance vs weight loss. Prefers to continue oxygen. No symptoms of fatigue, shortness of breath, headaches. Overall asymptomatic. --CONTINUE 2L O2 oxygen via nasal cannula nightly   Follow-up with me in 3 months

## 2021-03-29 NOTE — Addendum Note (Signed)
Addended by: Coralie Keens on: 03/29/2021 04:31 PM   Modules accepted: Orders

## 2021-04-01 ENCOUNTER — Other Ambulatory Visit: Payer: Self-pay

## 2021-04-01 ENCOUNTER — Other Ambulatory Visit: Payer: Self-pay | Admitting: *Deleted

## 2021-04-01 ENCOUNTER — Ambulatory Visit (INDEPENDENT_AMBULATORY_CARE_PROVIDER_SITE_OTHER): Payer: Medicare HMO | Admitting: Family Medicine

## 2021-04-01 ENCOUNTER — Ambulatory Visit: Payer: Medicare HMO | Admitting: Family Medicine

## 2021-04-01 ENCOUNTER — Encounter: Payer: Self-pay | Admitting: Family Medicine

## 2021-04-01 VITALS — BP 120/72 | HR 97 | Temp 98.2°F | Ht 64.0 in | Wt 231.6 lb

## 2021-04-01 DIAGNOSIS — Z87891 Personal history of nicotine dependence: Secondary | ICD-10-CM

## 2021-04-01 DIAGNOSIS — C689 Malignant neoplasm of urinary organ, unspecified: Secondary | ICD-10-CM

## 2021-04-01 DIAGNOSIS — R0602 Shortness of breath: Secondary | ICD-10-CM

## 2021-04-01 DIAGNOSIS — R600 Localized edema: Secondary | ICD-10-CM

## 2021-04-01 DIAGNOSIS — J9611 Chronic respiratory failure with hypoxia: Secondary | ICD-10-CM | POA: Diagnosis not present

## 2021-04-01 DIAGNOSIS — N3001 Acute cystitis with hematuria: Secondary | ICD-10-CM

## 2021-04-01 DIAGNOSIS — R399 Unspecified symptoms and signs involving the genitourinary system: Secondary | ICD-10-CM

## 2021-04-01 LAB — POCT URINALYSIS DIP (PROADVANTAGE DEVICE)
Bilirubin, UA: NEGATIVE
Glucose, UA: 500 mg/dL — AB
Ketones, POC UA: NEGATIVE mg/dL
Nitrite, UA: POSITIVE — AB
Specific Gravity, Urine: 1.02
Urobilinogen, Ur: 1
pH, UA: 6 (ref 5.0–8.0)

## 2021-04-01 MED ORDER — DOXYCYCLINE HYCLATE 100 MG PO TABS: 100.0000 mg | ORAL_TABLET | Freq: Two times a day (BID) | ORAL | 0 refills | Status: DC

## 2021-04-01 NOTE — Patient Instructions (Signed)
Start on the antibiotic, Doxycycline.   I will send your urine for culture.   If you develop worsening shortness of breath, chest pain, fast heart rate, etc, go to the closest emergency department or call 911.

## 2021-04-01 NOTE — Progress Notes (Signed)
Subjective:    Patient ID: Shannon Schultz, female    DOB: September 30, 1945, 75 y.o.   MRN: [id number]  HPI Chief Complaint  Patient presents with   Urinary Frequency    Increased and sensation of not emptying bladder after urination since friday   Leg Pain    Right leg pain has had for a long time usually a fluid pill works, it went away in left leg but not right with a stinging pain in right calf with discoloration. Has hx of cellulitis   Complains of urinary frequency, urgency and sensation of not emptying her bladder x 4 days.  Hx of UTI in early August which resolved based on follow up UA on 02/12/2021.  She has a hx of bladder cancer.  She has an appointment with her urologist, Dr. Alinda Money next month.   She also complains of right lower pain and swelling for the past 3-4 days. States her right lower leg is also more red and hot than usual. Her right calf is tender.  Denies hx of DVT and PE.  No recent surgery or immobilization. She has been more active than usual recently.  She was on Plavix for heart surgery and stopped in August 2022.   Reports worsening shortness of breath today. She has underlying COPD and chronic respiratory failure. No fever, chills, dizziness, chest pain, palpitations, abdominal pain, N/V/D.   Reviewed allergies, medications, past medical, surgical, family, and social history.    Review of Systems Pertinent positives and negatives in the history of present illness.     Objective:   Physical Exam Constitutional:      General: She is not in acute distress.    Appearance: She is obese.  Eyes:     Conjunctiva/sclera: Conjunctivae normal.  Cardiovascular:     Rate and Rhythm: Normal rate and regular rhythm.     Pulses: Normal pulses.     Comments: Trace edema on LLE, 1+ on RLE with hyperpigmentation, erythema on RLE up to mid shin. Right calf with worsening edema on right, approximately 3 cm Feet have bluish discoloration from veins. Normal sensation,  pulses, cap refill, ROM and strength.  Negative Homan's Pulmonary:     Effort: Pulmonary effort is normal.     Breath sounds: Normal breath sounds.  Musculoskeletal:     Right lower leg: 1+ Pitting Edema present.     Left lower leg: Edema present.  Skin:    General: Skin is warm and dry.     Capillary Refill: Capillary refill takes less than 2 seconds.  Neurological:     General: No focal deficit present.     Mental Status: She is alert.   BP 120/72 (BP Location: Right Arm, Patient Position: Sitting, Cuff Size: Large)   Pulse 97   Temp 98.2 F (36.8 C) (Oral)   Ht 5\' 4"  (1.626 m)   Wt 231 lb 9.6 oz (105.1 kg)   LMP  (LMP Unknown)   SpO2 92%   BMI 39.75 kg/m       Assessment & Plan:  Acute cystitis with hematuria - Plan: POCT Urinalysis DIP (Proadvantage Device), Urine Culture, doxycycline (VIBRA-TABS) 100 MG tablet -last UTI in August and Doxycycline was effective. I will treat her with Doxycycline today and she will follow up with urologist.   Urothelial cancer [facility]) -follow up with urologist   UTI symptoms - Plan: POCT Urinalysis DIP (Proadvantage Device), Urine Culture, CBC with Differential/Platelet  Shortness of breath - Plan: CBC with Differential/Platelet,  D-dimer, quantitative -she has underlying COPD. Pulse oximeter 96% in office.   Edema of right lower leg - Plan: CBC with Differential/Platelet, D-dimer, quantitative -check stat D dimer. Discussed options to send her to the ED today vs outpatient work up. Dr. Tomi Bamberger also examined patient and advised to work up patient as outpatient.  She will use warm compresses to posterior lower leg and elevate.  She was advised by Dr. Tomi Bamberger to take furosemide 20 mg daily and follow up on Thursday (in 3 days).  She is aware that I will need to send her to the ED this evening if I receive an elevated D dimer result. She will also go to the ED or call 911 if she has any worsening shortness of breath, chest pain, etc.   Chronic  hypoxemic respiratory failure ([place])

## 2021-04-02 ENCOUNTER — Emergency Department (HOSPITAL_COMMUNITY): Payer: Medicare HMO

## 2021-04-02 ENCOUNTER — Encounter (HOSPITAL_COMMUNITY): Payer: Self-pay

## 2021-04-02 ENCOUNTER — Emergency Department (HOSPITAL_BASED_OUTPATIENT_CLINIC_OR_DEPARTMENT_OTHER): Payer: Medicare HMO

## 2021-04-02 ENCOUNTER — Emergency Department (HOSPITAL_COMMUNITY)
Admission: EM | Admit: 2021-04-02 | Discharge: 2021-04-02 | Disposition: A | Discharge status: Home / Self Care | Payer: Medicare HMO | Attending: Emergency Medicine | Admitting: Emergency Medicine

## 2021-04-02 DIAGNOSIS — I2510 Atherosclerotic heart disease of native coronary artery without angina pectoris: Secondary | ICD-10-CM | POA: Insufficient documentation

## 2021-04-02 DIAGNOSIS — Z8616 Personal history of COVID-19: Secondary | ICD-10-CM | POA: Diagnosis not present

## 2021-04-02 DIAGNOSIS — I110 Hypertensive heart disease with heart failure: Secondary | ICD-10-CM | POA: Diagnosis not present

## 2021-04-02 DIAGNOSIS — Z79899 Other long term (current) drug therapy: Secondary | ICD-10-CM | POA: Insufficient documentation

## 2021-04-02 DIAGNOSIS — I509 Heart failure, unspecified: Secondary | ICD-10-CM | POA: Diagnosis not present

## 2021-04-02 DIAGNOSIS — Z91040 Latex allergy status: Secondary | ICD-10-CM | POA: Diagnosis not present

## 2021-04-02 DIAGNOSIS — R609 Edema, unspecified: Secondary | ICD-10-CM | POA: Diagnosis not present

## 2021-04-02 DIAGNOSIS — Z853 Personal history of malignant neoplasm of breast: Secondary | ICD-10-CM | POA: Insufficient documentation

## 2021-04-02 DIAGNOSIS — J449 Chronic obstructive pulmonary disease, unspecified: Secondary | ICD-10-CM | POA: Insufficient documentation

## 2021-04-02 DIAGNOSIS — E119 Type 2 diabetes mellitus without complications: Secondary | ICD-10-CM | POA: Insufficient documentation

## 2021-04-02 DIAGNOSIS — Z8551 Personal history of malignant neoplasm of bladder: Secondary | ICD-10-CM | POA: Diagnosis not present

## 2021-04-02 DIAGNOSIS — E039 Hypothyroidism, unspecified: Secondary | ICD-10-CM | POA: Insufficient documentation

## 2021-04-02 DIAGNOSIS — Z7984 Long term (current) use of oral hypoglycemic drugs: Secondary | ICD-10-CM | POA: Insufficient documentation

## 2021-04-02 DIAGNOSIS — I5031 Acute diastolic (congestive) heart failure: Secondary | ICD-10-CM | POA: Diagnosis not present

## 2021-04-02 DIAGNOSIS — Z87891 Personal history of nicotine dependence: Secondary | ICD-10-CM | POA: Insufficient documentation

## 2021-04-02 DIAGNOSIS — R0602 Shortness of breath: Secondary | ICD-10-CM | POA: Insufficient documentation

## 2021-04-02 DIAGNOSIS — Z872 Personal history of diseases of the skin and subcutaneous tissue: Secondary | ICD-10-CM | POA: Diagnosis not present

## 2021-04-02 LAB — CBC WITH DIFFERENTIAL/PLATELET
Abs Immature Granulocytes: 0.02 10*3/uL (ref 0.00–0.07)
Basophils Absolute: 0.1 10*3/uL (ref 0.0–0.2)
Basophils Absolute: 0 10*3/uL (ref 0.0–0.1)
Basophils Relative: 1 %
Basos: 1 %
EOS (ABSOLUTE): 0.1 10*3/uL (ref 0.0–0.4)
Eos: 2 %
Eosinophils Absolute: 0.1 10*3/uL (ref 0.0–0.5)
Eosinophils Relative: 3 %
HCT: 42.1 % (ref 36.0–46.0)
Hematocrit: 38.4 % (ref 34.0–46.6)
Hemoglobin: 13 g/dL (ref 11.1–15.9)
Hemoglobin: 13.4 g/dL (ref 12.0–15.0)
Immature Grans (Abs): 0 10*3/uL (ref 0.0–0.1)
Immature Granulocytes: 0 %
Immature Granulocytes: 1 %
Lymphocytes Absolute: 1 10*3/uL (ref 0.7–3.1)
Lymphocytes Relative: 18 %
Lymphs Abs: 0.7 10*3/uL (ref 0.7–4.0)
Lymphs: 19 %
MCH: 30.5 pg (ref 26.6–33.0)
MCH: 30.2 pg (ref 26.0–34.0)
MCHC: 33.9 g/dL (ref 31.5–35.7)
MCHC: 31.8 g/dL (ref 30.0–36.0)
MCV: 90 fL (ref 79–97)
MCV: 95 fL (ref 80.0–100.0)
Monocytes Absolute: 0.3 10*3/uL (ref 0.1–0.9)
Monocytes Absolute: 0.2 10*3/uL (ref 0.1–1.0)
Monocytes Relative: 5 %
Monocytes: 5 %
Neutro Abs: 2.8 10*3/uL (ref 1.7–7.7)
Neutrophils Absolute: 3.7 10*3/uL (ref 1.4–7.0)
Neutrophils Relative %: 72 %
Neutrophils: 73 %
Platelets: 95 10*3/uL — CL (ref 150–450)
Platelets: 100 10*3/uL — ABNORMAL LOW (ref 150–400)
RBC: 4.26 x10E6/uL (ref 3.77–5.28)
RBC: 4.43 MIL/uL (ref 3.87–5.11)
RDW: 14.2 % (ref 11.7–15.4)
RDW: 15.2 % (ref 11.5–15.5)
WBC: 5.1 10*3/uL (ref 3.4–10.8)
WBC: 3.9 10*3/uL — ABNORMAL LOW (ref 4.0–10.5)
nRBC: 0 % (ref 0.0–0.2)

## 2021-04-02 LAB — TROPONIN I (HIGH SENSITIVITY)
Troponin I (High Sensitivity): 7 ng/L (ref <18)
Troponin I (High Sensitivity): 8 ng/L (ref <18)

## 2021-04-02 LAB — COMPREHENSIVE METABOLIC PANEL
ALT: 51 U/L — ABNORMAL HIGH (ref 0–44)
AST: 37 U/L (ref 15–41)
Albumin: 3.6 g/dL (ref 3.5–5.0)
Alkaline Phosphatase: 60 U/L (ref 38–126)
Anion gap: 10 (ref 5–15)
BUN: 18 mg/dL (ref 8–23)
CO2: 25 mmol/L (ref 22–32)
Calcium: 9.5 mg/dL (ref 8.9–10.3)
Chloride: 108 mmol/L (ref 98–111)
Creatinine, Ser: 0.65 mg/dL (ref 0.44–1.00)
GFR, Estimated: >60 mL/min (ref >60)
Glucose, Bld: 179 mg/dL — ABNORMAL HIGH (ref 70–99)
Potassium: 3.7 mmol/L (ref 3.5–5.1)
Sodium: 143 mmol/L (ref 135–145)
Total Bilirubin: 0.8 mg/dL (ref 0.3–1.2)
Total Protein: 6.8 g/dL (ref 6.5–8.1)

## 2021-04-02 LAB — BRAIN NATRIURETIC PEPTIDE: B Natriuretic Peptide: 55.4 pg/mL (ref 0.0–100.0)

## 2021-04-02 LAB — D-DIMER, QUANTITATIVE: D-DIMER: 0.85 mg/L FEU — ABNORMAL HIGH (ref 0.00–0.49)

## 2021-04-02 MED ORDER — TECHNETIUM TO 99M ALBUMIN AGGREGATED
4.4000 | Freq: Once | INTRAVENOUS | Status: AC | PRN
  Administered 2021-04-02: 4.4 via INTRAVENOUS

## 2021-04-02 NOTE — ED Provider Notes (Signed)
[facility] DEPT Provider Note   CSN: [id number] Arrival date & time: 04/02/21  8413     History No chief complaint on file.   Shannon Schultz is a 75 y.o. female.  HPI     75 year old female comes in with chief complaint of abnormal labs.  Patient has history of COPD, diastolic CHF with preserved EF, AS s/p TAVR. She comes in because yesterday her PCP ordered D-dimer with concerns for DVT and PE and sent her to the ER, as the D-dimer is elevated.  Patient reports that because of her COPD she always has exertional shortness of breath, but it appears to be more pronounced recently.  Recovering from exertional shortness of breath is taking longer.  She has no associated chest pain.  She has a cough that is chronic, no hemoptysis.  No history of PE, DVT.  She is noticing bilateral lower extremity swelling that is worse than it normally is.   Past Medical History:  Diagnosis Date   Arthritis    "hands" (05/15/2017)   Bladder cancer ([place]) 2018   Breast cancer, right ([place]) 1992   DCIS,bladder ca (just dx)   Colon polyp    Complication of anesthesia 1992   "local anesthesia" used was hard to awaken from-no problems since (05/15/2017)   COPD (chronic obstructive pulmonary disease) (HCC)    Coronary artery disease    COVID-19 virus infection 05/05/2019   Diverticulosis    Elevated liver enzymes    fatty liver per ultrasound per pt   FHx: BRCA2 gene positive    sister with BRCA2 mutation (pt tested NEGATIVE)   HLD (hyperlipidemia)    HSV (herpes simplex virus) infection    on hip--on daily suppression   Hypertension    Hypothyroidism    took med 7 yrs after birth of 1st child   Impaired glucose tolerance    Migraine    On home oxygen therapy    "have it available but I'm not using it" (05/15/2017)   Osteopenia    S/P TAVR (transcatheter aortic valve replacement) 08/14/2020   s/p TAVR with a 26 mm Edwards S3U via the left subclavian approach by  Dr. Roxy Manns and Dr. Angelena Form   Severe aortic stenosis    Sleep apnea    Sleeps with 2 L O2 @ night   Type 2 diabetes mellitus ([place])    Ventral hernia    Vitamin D deficiency disease     Patient Active Problem List   Diagnosis Date Noted   Nocturnal hypoxemia 11/12/2020   S/P TAVR (transcatheter aortic valve replacement) 08/14/2020   Severe aortic stenosis    Contrast media allergy 06/26/2020   AAA (abdominal aortic aneurysm) without rupture 06/26/2020   Upper airway cough syndrome 12/20/2019   Hip fracture ([place]) 08/25/2019   Venous insufficiency 09/10/2018   Sinusitis 06/25/2018   Ophthalmic migraine 03/16/2018   COPD, group B, by GOLD 2017 classification ([place]) 09/30/2017   Chronic hypoxemic respiratory failure ([place]) 09/29/2017   Ventral hernia without obstruction or gangrene 05/15/2017   Cirrhosis of liver without ascites ([place]) 03/11/2017   Urothelial cancer ([place]) 03/10/2017   Atherosclerosis of coronary artery 09/14/2016   Aortic atherosclerosis ([place]) 09/14/2016   Class 2 severe obesity due to excess calories with serious comorbidity and body mass index (BMI) of 39.0 to 39.9 in adult ([place]) 11/10/2015   Thrombocytopenia ([place]) 08/17/2015   Elevated troponin 08/17/2015   Diabetes mellitus type 2 in obese ([place]) 08/17/2015   Severe obesity (  BMI >= 40) ([place]) 10/06/2014   FHx: BRCA2 gene positive    HSV (herpes simplex virus) infection 06/07/2012   Pure hypercholesterolemia 11/13/2010   Essential hypertension, benign 11/13/2010    Past Surgical History:  Procedure Laterality Date   ABDOMINAL HERNIA REPAIR  05/15/2017   BREAST BIOPSY Right 1992   BREAST BIOPSY Left 2018   BREAST EXCISIONAL BIOPSY Left 2018   ATYPICAL DUCTAL HYPERPLASIA INVOLVING A COMPLEX   BREAST LUMPECTOMY WITH RADIOACTIVE SEED LOCALIZATION Left 12/22/2016   Procedure: LEFT BREAST LUMPECTOMY WITH RADIOACTIVE SEED LOCALIZATION;  Surgeon: Jovita Kussmaul, MD;  Location: [facility];  Service: General;  Laterality: Left;    CARDIAC CATHETERIZATION     CATARACT EXTRACTION, BILATERAL Bilateral R 03/16/2019 L 03/30/2019   Dr. Kathlen Mody   COLONOSCOPY  2009, 08/2010, 12/2015   Dr. Collene Mares; "only 1 had any polyps" (05/15/2017)   CYSTOSCOPY  04/23/2017   CYSTOSCOPY W/ RETROGRADES Bilateral 01/12/2017   Procedure: CYSTOSCOPY WITH RETROGRADE PYELOGRAM/ EXAM UNDER ANESTHESIA;  Surgeon: Raynelle Bring, MD;  Location: [facility];  Service: Urology;  Laterality: Bilateral;   EYE SURGERY     FEMUR IM NAIL Right 08/24/2019   Procedure: INTRAMEDULLARY (IM) NAIL FEMORAL;  Surgeon: Rod Can, MD;  Location: [facility];  Service: Orthopedics;  Laterality: Right;   HERNIA REPAIR     INSERTION OF MESH N/A 05/15/2017   Procedure: INSERTION OF MESH;  Surgeon: Jovita Kussmaul, MD;  Location: [facility];  Service: General;  Laterality: N/A;   LAPAROSCOPIC CHOLECYSTECTOMY  2003   MASTECTOMY Right 1992   RIGHT/LEFT HEART CATH AND CORONARY ANGIOGRAPHY N/A 07/05/2020   Procedure: RIGHT/LEFT HEART CATH AND CORONARY ANGIOGRAPHY;  Surgeon: Burnell Blanks, MD;  Location: [facility];  Service: Cardiovascular;  Laterality: N/A;   TEE WITHOUT CARDIOVERSION N/A 08/14/2020   Procedure: TRANSESOPHAGEAL ECHOCARDIOGRAM (TEE);  Surgeon: Burnell Blanks, MD;  Location: [facility];  Service: Open Heart Surgery;  Laterality: N/A;   TRANSURETHRAL RESECTION OF BLADDER TUMOR WITH MITOMYCIN-C N/A 01/12/2017   Procedure: TRANSURETHRAL RESECTION OF BLADDER TUMOR WITH POSSIBLE POST OPERATIVE INSTILLATION OF MITOMYCIN-C;  Surgeon: Raynelle Bring, MD;  Location: [facility];  Service: Urology;  Laterality: N/A;   TYMPANOSTOMY TUBE PLACEMENT Bilateral 1224M   UMBILICAL HERNIA REPAIR  2003   VENTRAL HERNIA REPAIR N/A 05/15/2017   Procedure: VENTRAL HERNIA REPAIR WITH MESH;  Surgeon: Jovita Kussmaul, MD;  Location: [facility];  Service: General;  Laterality: N/A;     OB History     Gravida  3   Para  3   Term      Preterm      AB      Living  3      SAB       IAB      Ectopic      Multiple      Live Births              Family History  Problem Relation Age of Onset   Heart disease Father    Diabetes Father    Hypertension Father    Asthma Sister    Allergies Sister    Hyperlipidemia Sister    Hashimoto's thyroiditis Sister    Fibromyalgia Sister    Heart disease Mother        tachycardia   Asthma Sister    Allergies Sister    Hyperlipidemia Sister    Hashimoto's thyroiditis Sister    Fibromyalgia Sister    Breast cancer  Sister 42       BRCA2 positive; metastatic to bones at age 49, then spread to liver   Cancer Other        female cancers, bone cancer   Cancer Maternal Grandmother        deceased 85; unk. primary; possibly stomach   Tuberculosis Maternal Grandfather    Stroke Paternal Grandmother 29       died of cerebral hemorrhage    Social History   Tobacco Use   Smoking status: Former    Packs/day: 1.00    Years: 46.00    Pack years: 46.00    Types: Cigarettes    Quit date: 05/31/2011    Years since quitting: 9.8   Smokeless tobacco: Never  Vaping Use   Vaping Use: Never used  Substance Use Topics   Alcohol use: No   Drug use: No    Home Medications Prior to Admission medications   Medication Sig Start Date End Date Taking? Authorizing Provider  acetaminophen (TYLENOL) 500 MG tablet Take 1,000 mg by mouth every 6 (six) hours as needed for moderate pain or headache.   Yes [provider]  albuterol (PROVENTIL) (2.5 MG/3ML) 0.083% nebulizer solution USE 1 VIAL BY NEBULIZATION EVERY FOUR HOURS AS NEEDED FOR WHEEZING OR SHORTNESS OF BREATH. Patient taking differently: Take 2.5 mg by nebulization every 4 (four) hours as needed for wheezing or shortness of breath. 05/06/19  Yes Tysinger, Camelia Eng, PA-C  albuterol (VENTOLIN HFA) 108 (90 Base) MCG/ACT inhaler Inhale 2 puffs into the lungs every 6 (six) hours as needed for wheezing or shortness of breath. 03/28/19  Yes [name], MD   bisoprolol-hydrochlorothiazide [facility]) 10-6.25 MG tablet Take 1 tablet by mouth daily. 10/25/20  Yes [name], MD  CALCIUM ACETATE-MAGNESIUM CARB PO Take 1 tablet by mouth daily.   Yes [provider]  cetirizine (ZYRTEC) 10 MG tablet Take 10 mg by mouth daily.   Yes [provider]  furosemide (LASIX) 20 MG tablet TAKE 1 TABLET (20 MG TOTAL) BY MOUTH DAILY AS NEEDED FOR FLUID OR EDEMA. 10/27/17  Yes [name], MD  guaiFENesin (MUCINEX) 600 MG 12 hr tablet Take 600 mg by mouth daily.   Yes [provider]  metFORMIN (GLUCOPHAGE-XR) 500 MG 24 hr tablet TAKE 1 TABLET THREE TIMES DAILY Patient taking differently: Take 500 mg by mouth in the morning, at noon, and at bedtime. 12/27/20  Yes [name], MD  montelukast (SINGULAIR) 10 MG tablet Take 1 tablet (10 mg total) by mouth at bedtime. 03/20/20  Yes [name], MD  Multiple Vitamin (MULTIVITAMIN) capsule Take 1 capsule by mouth daily.   Yes [provider]  simvastatin (ZOCOR) 20 MG tablet Take 1 tablet (20 mg total) by mouth at bedtime. 10/25/20  Yes [name], MD  triamcinolone cream (KENALOG) 0.1 % Apply 1 application topically as needed (rash or redness). 12/07/18  Yes [provider]  umeclidinium-vilanterol (ANORO ELLIPTA) 62.5-25 MCG/INH AEPB Inhale 1 puff into the lungs daily. 10/23/20  Yes [name], MD  valACYclovir (VALTREX) 500 MG tablet Take 1 tablet (500 mg total) by mouth daily. 08/28/20  Yes [name], MD  ACCU-CHEK AVIVA PLUS test strip TEST BLOOD SUGAR ONE TO TWO TIMES DAILY 07/04/20   [name], MD  Accu-Chek Softclix Lancets lancets Test BS 1-2 times daily. Pt uses accu-chek meter 12/21/19   Henson, Vickie L, NP-C  Alcohol Swabs (B-D SINGLE USE SWABS REGULAR) PADS Use twice a day  when checking blood sugars 12/21/19   [name], MD  doxycycline (VIBRA-TABS) 100 MG tablet Take 1 tablet (100 mg total) by mouth 2 (two) times daily. 04/01/21   Henson, Vickie L, NP-C  methocarbamol  (ROBAXIN) 500 MG tablet Take 1-2 tablets (500-1,000 mg total) by mouth every 8 (eight) hours as needed for muscle spasms. Patient not taking: Reported on 04/02/2021 10/25/20   [name], MD  TRUEplus Lancets 30G MISC TEST BLOOD SUGAR ONE TO TWO TIMES DAILY 03/21/20   [name], MD    Allergies    Contrast media [iodinated diagnostic agents], Iohexol, Lisinopril, Sulfa antibiotics, Latex, Nickel, Codeine, Levofloxacin, Lipitor [atorvastatin calcium], Meloxicam, and Vicodin [hydrocodone-acetaminophen]  Review of Systems   Review of Systems  Constitutional:  Positive for activity change.  Respiratory:  Positive for cough and shortness of breath.   All other systems reviewed and are negative.  Physical Exam Updated Vital Signs BP (!) 189/86   Pulse 84   Temp 98.2 F (36.8 C) (Oral)   Resp 18   Ht _0  (1.626 m)   Wt 105.1 kg   LMP  (LMP Unknown)   SpO2 97%   BMI 39.75 kg/m   Physical Exam Vitals and nursing note reviewed.  Constitutional:      Appearance: She is well-developed.  HENT:     Head: Atraumatic.  Cardiovascular:     Rate and Rhythm: Normal rate.  Pulmonary:     Effort: Pulmonary effort is normal.  Musculoskeletal:     Cervical back: Normal range of motion and neck supple.     Right lower leg: Edema present.     Left lower leg: Edema present.  Skin:    General: Skin is warm and dry.  Neurological:     Mental Status: She is alert and oriented to person, place, and time.    ED Results / Procedures / Treatments   Labs (all labs ordered are listed, but only abnormal results are displayed) Labs Reviewed  COMPREHENSIVE METABOLIC PANEL - Abnormal; Notable for the following components:      Result Value   Glucose, Bld 179 (*)    ALT 51 (*)    All other components within normal limits  CBC WITH DIFFERENTIAL/PLATELET - Abnormal; Notable for the following components:   WBC 3.9 (*)    Platelets 100 (*)    All other components within normal limits  BRAIN  NATRIURETIC PEPTIDE  TROPONIN I (HIGH SENSITIVITY)  TROPONIN I (HIGH SENSITIVITY)    EKG EKG Interpretation  Date/Time:  Tuesday April 02 2021 11:44:29 EDT Ventricular Rate:  87 PR Interval:  182 QRS Duration: 139 QT Interval:  414 QTC Calculation: 499 R Axis:   -1 Text Interpretation: Sinus rhythm Left bundle branch block LBBB is new Confirmed by Varney Biles [phone]) on 04/02/2021 12:08:58 PM  Radiology DG Chest 2 View  Result Date: 04/02/2021 CLINICAL DATA:  Shortness of breath since yesterday. History of COPD, heart valve replacement EXAM: CHEST - 2 VIEW COMPARISON:  Chest radiograph 08/14/2020 FINDINGS: The cardiomediastinal silhouette is stable. Post TAVR changes are again noted. The lungs hyperinflated with flattening of the diaphragms consistent with COPD. There is vascular congestion without overt pulmonary edema. There is no focal consolidation. There is no pleural effusion or pneumothorax. There is no acute osseous abnormality. Bilateral axillary surgical clips are again noted. IMPRESSION: 1. Vascular congestion without overt pulmonary edema. 2. COPD. Electronically Signed   By: Valetta Mole M.D.   On: 04/02/2021 08:05  NM Pulmonary Perfusion  Result Date: 04/02/2021 CLINICAL DATA:  Shortness of breath, chest pain, leg pain with cellulitis, symptoms for 2 days, elevated D-dimer, history COPD, high clinical suspicion for pulmonary embolism EXAM: NUCLEAR MEDICINE PERFUSION LUNG SCAN TECHNIQUE: Perfusion images were obtained in multiple projections after intravenous injection of radiopharmaceutical. Ventilation scans intentionally deferred if perfusion scan and chest x-ray adequate for interpretation during COVID 19 epidemic. RADIOPHARMACEUTICALS:  4.4 mCi Tc-82mMAA IV COMPARISON:  None Chest radiograph 04/02/2021 FINDINGS: Few tiny foci of increased tracer accumulation likely representing clumped MAA. Perfusion exam otherwise normal. No segmental or subsegmental perfusion  defects. IMPRESSION: Pulmonary embolism absent. Electronically Signed   By: MLavonia DanaM.D.   On: 04/02/2021 13:53   VAS UK[place]LOWER EXTREMITY VENOUS (DVT) (7a-7p)  Result Date: 04/02/2021  Lower Venous DVT Study Patient Name:  Shannon Schultz Date of Exam:   04/02/2021 Medical Rec #: [id number]     Accession #:    [id number] of Birth: 1January 26, 1947    Patient Gender: F Patient Age:   89years [facility]      VAS [place]OWER EXTREMITY VENOUS (DVT) Referring Phys: LTheodis Blaze--------------------------------------------------------------------------------  Indications: Edema.  Risk Factors: None identified. Limitations: Body habitus and poor ultrasound/tissue interface. Comparison Study: No prior studies. Performing Technologist: GOliver HumRVT  Examination Guidelines: A complete evaluation includes B-mode imaging, spectral Doppler, color Doppler, and power Doppler as needed of all accessible portions of each vessel. Bilateral testing is considered an integral part of a complete examination. Limited examinations for reoccurring indications may be performed as noted. The reflux portion of the exam is performed with the patient in reverse Trendelenburg.  +---------+---------------+---------+-----------+----------+--------------+ RIGHT    CompressibilityPhasicitySpontaneityPropertiesThrombus Aging +---------+---------------+---------+-----------+----------+--------------+ CFV      Full           Yes      Yes                                 +---------+---------------+---------+-----------+----------+--------------+ SFJ      Full                                                        +---------+---------------+---------+-----------+----------+--------------+ FV Prox  Full                                                        +---------+---------------+---------+-----------+----------+--------------+ FV Mid   Full                                                         +---------+---------------+---------+-----------+----------+--------------+ FV DistalFull                                                        +---------+---------------+---------+-----------+----------+--------------+  PFV      Full                                                        +---------+---------------+---------+-----------+----------+--------------+ POP      Full           Yes      Yes                                 +---------+---------------+---------+-----------+----------+--------------+ PTV      Full                                                        +---------+---------------+---------+-----------+----------+--------------+ PERO     Full                                                        +---------+---------------+---------+-----------+----------+--------------+   +----+---------------+---------+-----------+----------+--------------+ LEFTCompressibilityPhasicitySpontaneityPropertiesThrombus Aging +----+---------------+---------+-----------+----------+--------------+ CFV Full           Yes      Yes                                 +----+---------------+---------+-----------+----------+--------------+    Summary: RIGHT: - There is no evidence of deep vein thrombosis in the lower extremity. However, portions of this examination were limited- see technologist comments above.  - No cystic structure found in the popliteal fossa.  LEFT: - No evidence of common femoral vein obstruction.  *See table(s) above for measurements and observations.    Preliminary     Procedures Procedures   Medications Ordered in ED Medications  technetium albumin aggregated (MAA) injection solution 4.4 millicurie (4.4 millicuries Intravenous Contrast Given 04/02/21 1030)    ED Course  I have reviewed the triage vital signs and the nursing notes.  Pertinent labs & imaging results that were available during my care of the patient were  reviewed by me and considered in my medical decision making (see chart for details).    MDM Rules/Calculators/A&P                            75 year old comes in with chief complaint of worsening shortness of breath. Patient has history of COPD, CHF with preserved EF, CAD, aortic stenosis status post TAVR.  I reviewed patient's echocardiogram from March.  She had diastolic dysfunction at that time with aortic valve postrepair looking fine.  On exam patient is noted to have pitting edema that is bilateral, calf tenderness that is bilateral.  DVT studies were ordered because of her elevated D-dimer and it is negative.  She also has a VQ scan.  Chest x-ray showing some evidence of vascular congestion.  I suspect that her symptoms could be because of worsening of her diastolic CHF/CHF exacerbation.  If her VQ scan is reassuring then we will discharge her with 20 mg extra  Lasix for the next 3 to 5 days and cardiology follow-up.  Of note, her EKG showing new left bundle branch block. ACS is low in the differential diagnosis, delta troponins ordered nonetheless to r/o acute MI.  Final Clinical Impression(s) / ED Diagnoses Final diagnoses:  Acute diastolic congestive heart failure [facility])    Rx / DC Orders ED Discharge Orders     None        Varney Biles, MD 04/02/21 1458

## 2021-04-02 NOTE — Discharge Instructions (Addendum)
We saw in the ER for abnormal labs and shortness of breath.  X-ray of your lungs, VQ scan to look for blood clots are both reassuring.  We recommend that you double your Lasix by taking 1 tablet in the morning and 1 at night for the next 3 days. It might be a good idea to follow-up with cardiology team, your information has been sent to them.

## 2021-04-02 NOTE — ED Provider Notes (Addendum)
Emergency Medicine Provider Triage Evaluation Note  Shannon Schultz , a 75 y.o. female  was evaluated in triage.  Pt complains of RLE cellulitis which started Saturday and shortness of breath.  States that she went to her primary care provider yesterday who was concerned for DVT/PE.  Obtained D-dimer which was elevated.  States that the primary care provider called her this morning about 445 and told her to present to the emergency department.  She does have shortness of breath from yesterday to her primary care provider.  She has no history of blood clots.  She took Plavix until the end of August due to heart surgery.   Review of Systems  Positive: Lower extremity edema R>L, SOB Negative: Fever, CP, lightheaded or dizziness.   Physical Exam  BP (!) 141/63 (BP Location: Left Arm)   Pulse 83   Temp 98.2 F (36.8 C) (Oral)   Resp 18   Ht 5\' 4"  (1.626 m)   Wt 105.1 kg   LMP  (LMP Unknown)   SpO2 96%   BMI 39.75 kg/m  Gen:   Awake, no distress, alert and oriented Resp:  Normal effort, lung sounds clear bilaterally, O2 sat 98% on room air MSK:   Right greater than left lower extremity edema.  The right leg is also red and warm.  + Homans' sign. moves extremities without difficulty.   Medical Decision Making  Medically screening exam initiated at 6:55 AM.  Appropriate orders placed.  Shannon Schultz was informed that the remainder of the evaluation will be completed by another provider, this initial triage assessment does not replace that evaluation, and the importance of remaining in the ED until their evaluation is complete.  0730: Spoke with Dr. Sabra Heck with radiology about patient's severe contrast allergy.  It appears that she has had 1 CTA study previously in January however received likely the 13-hour prep.  Will place in nuclear medicine VQ scan to evaluate for PE.    Mickie Hillier, PA-C 04/02/21 0716    Mickie Hillier, PA-C 04/02/21 4562    Quintella Reichert, MD 04/02/21  [phone]

## 2021-04-02 NOTE — Progress Notes (Signed)
Right lower extremity venous duplex has been completed. Preliminary results can be found in CV Proc through chart review.  Results were given to Dr. Kathrynn Humble.  04/02/21 9:03 AM Shannon Schultz RVT

## 2021-04-02 NOTE — ED Triage Notes (Signed)
Pt was advised by her PCP to come to the ED because her d-dimer was elevated yesterday at her appt. He went to her doctor for a UTI originally

## 2021-04-03 NOTE — Progress Notes (Signed)
Chief Complaint  Patient presents with   Follow-up    Follow up from [facility] about the same. Needs new rx for neb solution,    Patient presents for follow-up. She saw Vickie on 10/3 with urinary complaints, and complaints of RLE swelling, discomfort, redness. She was prescribed doxycycline to cover UTI as well as cellulitis.  Pt had edema in BLE, and was advised to take lasix daily, and keep leg elevated. Her CBC was normal, but D-dimer was elevated.  She was contacted the following morning, and went to ER.  She work-up to evaluate for DVT and PE, both of which were negative.  She was felt to be having flare of CHF, and her lasix dose was increased.  CXR showed COPD, and vascular congestion, without overt pulmonary edema.  She has been taking lasix at 6am and 2:30pm.  Her swelling has improved. She has been having cramping in her hands and feet.  Topical medications for cramping aren't working.  She doesn't take any potassium supplements. She has been eating 1 banana daily and using mustard as needed for cramps.  She was noted to have new LBBB.  She was told to f/u with her cardiologist (whom she saw in July, only sees yearly). Troponins and BNP were normal.  C-met okay other than elevated glu, and CBC showed persistent mild thrombocytopenia. She last saw Dr. Recardo Evangelist in 12/2020.  There was an issue with her doxycycline at the pharmacy, finally started it Tuesday evening (has now had a total of 4 doses).  Has only noticed a slight improvement in urinary symptoms. Denies fever, flank pain, or abdominal pain.  COPD:  she states that she thinks her nebulizer solution is expired, asking for refill--though as refill was being done, changed mind, declines (never needs/uses it).  PMH, PSH, SH reviewed  Outpatient Encounter Medications as of 04/04/2021  Medication Sig Note   ACCU-CHEK AVIVA PLUS test strip TEST BLOOD SUGAR ONE TO TWO TIMES DAILY    Accu-Chek Softclix Lancets lancets Test BS 1-2  times daily. Pt uses accu-chek meter    acetaminophen (TYLENOL) 500 MG tablet Take 1,000 mg by mouth every 6 (six) hours as needed for moderate pain or headache.    Alcohol Swabs (B-D SINGLE USE SWABS REGULAR) PADS Use twice a day when checking blood sugars    bisoprolol-hydrochlorothiazide (ZIAC) 10-6.25 MG tablet Take 1 tablet by mouth daily.    CALCIUM ACETATE-MAGNESIUM CARB PO Take 1 tablet by mouth daily. 04/04/2021: With zinc and vitamin D   cetirizine (ZYRTEC) 10 MG tablet Take 10 mg by mouth daily.    doxycycline (VIBRA-TABS) 100 MG tablet Take 1 tablet (100 mg total) by mouth 2 (two) times daily. 04/02/2021: Not taking yet ( wasn't ready at Rx)   furosemide (LASIX) 20 MG tablet TAKE 1 TABLET (20 MG TOTAL) BY MOUTH DAILY AS NEEDED FOR FLUID OR EDEMA. 04/04/2021: BID   guaiFENesin (MUCINEX) 600 MG 12 hr tablet Take 600 mg by mouth daily.    metFORMIN (GLUCOPHAGE-XR) 500 MG 24 hr tablet TAKE 1 TABLET THREE TIMES DAILY (Patient taking differently: Take 500 mg by mouth in the morning, at noon, and at bedtime.) 04/04/2021: Taking 2 daily   montelukast (SINGULAIR) 10 MG tablet Take 1 tablet (10 mg total) by mouth at bedtime.    Multiple Vitamin (MULTIVITAMIN) capsule Take 1 capsule by mouth daily.    simvastatin (ZOCOR) 20 MG tablet Take 1 tablet (20 mg total) by mouth at bedtime.    triamcinolone cream (  KENALOG) 0.1 % Apply 1 application topically as needed (rash or redness).    TRUEplus Lancets 30G MISC TEST BLOOD SUGAR ONE TO TWO TIMES DAILY    umeclidinium-vilanterol (ANORO ELLIPTA) 62.5-25 MCG/INH AEPB Inhale 1 puff into the lungs daily.    valACYclovir (VALTREX) 500 MG tablet Take 1 tablet (500 mg total) by mouth daily.    albuterol (PROVENTIL) (2.5 MG/3ML) 0.083% nebulizer solution USE 1 VIAL BY NEBULIZATION EVERY FOUR HOURS AS NEEDED FOR WHEEZING OR SHORTNESS OF BREATH. (Patient not taking: Reported on 04/04/2021)    albuterol (VENTOLIN HFA) 108 (90 Base) MCG/ACT inhaler Inhale 2 puffs into  the lungs every 6 (six) hours as needed for wheezing or shortness of breath. (Patient not taking: Reported on 04/04/2021)    methocarbamol (ROBAXIN) 500 MG tablet Take 1-2 tablets (500-1,000 mg total) by mouth every 8 (eight) hours as needed for muscle spasms. (Patient not taking: No sig reported)    No facility-administered encounter medications on file as of 04/04/2021.   Allergies  Allergen Reactions   Contrast Media [Iodinated Diagnostic Agents] Anaphylaxis, Shortness Of Breath and Other (See Comments)    Could not breath   Iohexol Anaphylaxis, Shortness Of Breath and Other (See Comments)    Immediately could not breathe   Lisinopril Anaphylaxis, Shortness Of Breath and Rash   Sulfa Antibiotics Anaphylaxis and Other (See Comments)    Historical from mother, pt states that mother says she almost died from this drug   Latex Other (See Comments)    Unless against on skin for a long time, blisters. Short term is okay.   Nickel Other (See Comments)    With earrings pt has soreness and drainage from piercing   Codeine Nausea Only, Anxiety and Other (See Comments)    insomnia   Levofloxacin Other (See Comments)    insomnia   Lipitor [Atorvastatin Calcium] Rash   Meloxicam Rash    Broke out in a rash on her stomach,back, legs, and behind ears.   Vicodin [Hydrocodone-Acetaminophen] Itching   ROS:  no fever, chills, URI symptoms.  Shortness of breath has improved from visit earlier in the week. She denies chest pain. She denies GI complaints.  Urinary urgency and discomfort are slightly improved.  Swelling in legs have improved, redness is less, still some discomfort in RLE.  Wearing compression stockings. No bleeding or rashes. She reports known psoriasis on legs, and has steroid cream which she has been using, has areas of itchy rash.   PHYSICAL EXAM:  BP 140/70   Pulse 76   Ht 5' 4" (1.626 m)   Wt 228 lb 3.2 oz (103.5 kg)   LMP  (LMP Unknown)   BMI 39.17 kg/m  Wt Readings from Last  3 Encounters:  04/04/21 228 lb 3.2 oz (103.5 kg)  04/02/21 231 lb 9.6 oz (105.1 kg)  04/01/21 231 lb 9.6 oz (105.1 kg)   Pleasant, well-appearing female, in no distress HEENT: conjunctiva and sclera are clear, EOMI, wearing mask Neck: no lymphadenopathy, thyromegaly or mass Heart: regular rate and rhythm Lungs: clear bilaterally, no wheezes, rales, ronchi Abdomen: obese, soft, nontender. Back: no CVA tenderness Extremities: Edema improved bilaterally, just trace bilaterally. RLE--less erythema, less tender.  Area of linear scabbing (minor) at lateral shin and dry skin posteriorly--no longer feeling any bulging or tenderness posteriorly. Chronic hyperpigmentation of BLE's Psych: normal mood, affect, hygiene and grooming   ASSESSMENT/PLAN:  Acute cystitis with hematuria - pt on doxycycline x 4 doses. Slight improvement in sx.  Culture  still pending  Medication monitoring encounter - Plan: Basic metabolic panel  LBBB (left bundle branch block) - this was noted in ER, new, not on prior EKG's from Dr. Recardo Evangelist.  Recommended she f/u with Dr. Loletha Grayer  Cellulitis of right lower extremity - improving on doxy (less erythema, warmth, pain). Cont compression stockings, leg elevation, complete ABX  Bilateral lower extremity edema - R>L--this has been improving with increased diuretic and compression stocking use, poss mild CHF. Decrease lasix to 10m, then to prn once edema better   Cellulitis and edema in RLE is improving. Complete course of antibiotics, continue compression socks and lasix   Decrease the furosemide to taking just 1 tablet daily starting tomorrow. Increase back to 2/d if your swelling worsens. Continue to wear the compression stockings and keep the legs elevated with prolonged sitting. We will contact you with the urine culture results when back. Complete the current course of antibiotics.  To f/u with cardiology    I spent 33 minutes dedicated to the care of this patient,  including pre-visit review of records, face to face time, post-visit ordering of testing and documentation.

## 2021-04-04 ENCOUNTER — Ambulatory Visit (INDEPENDENT_AMBULATORY_CARE_PROVIDER_SITE_OTHER): Payer: Medicare HMO | Admitting: Family Medicine

## 2021-04-04 ENCOUNTER — Other Ambulatory Visit: Payer: Self-pay

## 2021-04-04 ENCOUNTER — Telehealth: Payer: Self-pay | Admitting: Cardiovascular Disease

## 2021-04-04 ENCOUNTER — Encounter: Payer: Self-pay | Admitting: Family Medicine

## 2021-04-04 VITALS — BP 140/70 | HR 76 | Ht 64.0 in | Wt 228.2 lb

## 2021-04-04 DIAGNOSIS — L03115 Cellulitis of right lower limb: Secondary | ICD-10-CM | POA: Diagnosis not present

## 2021-04-04 DIAGNOSIS — I447 Left bundle-branch block, unspecified: Secondary | ICD-10-CM

## 2021-04-04 DIAGNOSIS — N3001 Acute cystitis with hematuria: Secondary | ICD-10-CM

## 2021-04-04 DIAGNOSIS — R600 Localized edema: Secondary | ICD-10-CM | POA: Diagnosis not present

## 2021-04-04 DIAGNOSIS — Z5181 Encounter for therapeutic drug level monitoring: Secondary | ICD-10-CM | POA: Diagnosis not present

## 2021-04-04 NOTE — Telephone Encounter (Signed)
Patient states she was at the ED and her ekg showed a LBBB. She would like to know if she needs any testing or to schedule a follow up appointment.

## 2021-04-04 NOTE — Telephone Encounter (Signed)
Called patient, she was seen in ED- noted to have EKG changes, and seen PCP today as well- in review of visit they suggested she be seen for a follow up. I advised patient that I would make Dr.C aware- but also scheduled for hospital visit on 10/26 with Almyra Deforest, PA-C. Patient verbalized understanding.

## 2021-04-04 NOTE — Patient Instructions (Addendum)
I think it is a good idea to see Dr. Recardo Evangelist to follow up on the change in EKG.  A new Left bundle branch block was noted on the EKG done in the ER, which you haven't had on prior EKG's.   Decrease the furosemide to taking just 1 tablet daily starting tomorrow. Increase back to 2/d if your swelling worsens. Continue to wear the compression stockings and keep the legs elevated with prolonged sitting. We will contact you with the urine culture results when back. Complete the current course of antibiotics.

## 2021-04-05 LAB — BASIC METABOLIC PANEL
BUN/Creatinine Ratio: 25 (ref 12–28)
BUN: 18 mg/dL (ref 8–27)
CO2: 26 mmol/L (ref 20–29)
Calcium: 9.8 mg/dL (ref 8.7–10.3)
Chloride: 99 mmol/L (ref 96–106)
Creatinine, Ser: 0.71 mg/dL (ref 0.57–1.00)
Glucose: 160 mg/dL — ABNORMAL HIGH (ref 70–99)
Potassium: 3.9 mmol/L (ref 3.5–5.2)
Sodium: 141 mmol/L (ref 134–144)
eGFR: 89 mL/min/{1.73_m2} (ref >59)

## 2021-04-05 LAB — URINE CULTURE

## 2021-04-05 NOTE — Telephone Encounter (Signed)
Left a message for the patient to call back.  

## 2021-04-08 NOTE — Telephone Encounter (Signed)
Patient returning call.

## 2021-04-08 NOTE — Telephone Encounter (Signed)
Patient has been made aware. She will keep her follow up with Almyra Deforest, PA on 10/26

## 2021-04-10 DIAGNOSIS — L2089 Other atopic dermatitis: Secondary | ICD-10-CM | POA: Diagnosis not present

## 2021-04-12 DIAGNOSIS — C678 Malignant neoplasm of overlapping sites of bladder: Secondary | ICD-10-CM | POA: Diagnosis not present

## 2021-04-12 DIAGNOSIS — N3000 Acute cystitis without hematuria: Secondary | ICD-10-CM | POA: Diagnosis not present

## 2021-04-22 ENCOUNTER — Telehealth: Payer: Self-pay | Admitting: Pharmacist

## 2021-04-22 NOTE — Chronic Care Management (AMB) (Signed)
Chronic Care Management Pharmacy Assistant   Name: NARIYA NEUMEYER  MRN: [id number] DOB: 10/01/1945  Shannon Schultz is an 75 y.o. year old female who presents for herinitial CCM visit with the clinical pharmacist.  Reason for Encounter: Chart Prep for initial visit with Jeni Salles, Pharm D.   Conditions to be addressed/monitored: HTN, HLD, COPD, DMII, and aortic atherosclerosis, atherosclerosis of coronary artery, severe aortic stenosis, chronic hypoxemic respiratory failure, nocturnal hypoxemia, cirrhosis of liver without ascites, urothelial cancer and thrombocytopenia.   Recent office visits:  04/04/21 [name] MD (PCP) - seen for acute cystitis with hematuria and other chronic conditions. Decrease furosemide to just 1 tablet daily. Increase back if needed. Follow up with Dr. Sallyanne Kuster.   04/01/21 Vickie Henson PA-C (Family Medicine) - seen for acute cystitis with hematuria and other issues. Patient started on doxycycline 100mg  2 times daily. Follow up as needed.   01/29/21 Jill Alexanders MD (Family Medicine) - seen for acute cystitis with hematuria and other issues. Patient started on doxycycline 100mg  2 times daily. Follow up in 2 weeks.   10/25/20 [name] MD (Family Medicine) - seen for type 2 diabetes mellitus and other chronic conditions. Patient started on methocarbamol 500-1000mg  every 8 hours as needed. Follow up for fasting labs.   Recent consult visits:  03/29/21 Chi Rodman Pickle MD (Pulmonology) - seen for COPD group B. Changed umeclidinium- Vilanterol 62.5- 1mcg 1 puff daily. Discontinued amoxicillin, aspirin and doxycycline. Follow up in 3 months.   02/18/21 Cherlynn June - seen for displaced intertrochanteric fracture of right femur, subsequent encounter for closed fracture with routine healing and other issues. No medication changes or follow up noted.   01/10/21 Mihai Croitoru MD (Cardiology) - seen for nonrheumatic aortic valve stenosis and other issues. Discontinued  aspirin-caffeine 845-65mg  1 packet oral daily. Follow up for 12 months.   11/23/20 Marshall Cork (Ophthalmology) - seen for presence of intraocular lens and other issues. No medication changes. No follow up noted.   10/23/20 Chi Rodman Pickle MD (Pulmonology) - seen for COPD group B by GOLD 2017 classification. Discontinued fluticasone. Follow up in 3 months.   Hospital visits:  Medication Reconciliation was completed by comparing discharge summary, patient's EMR and Pharmacy list, and upon discussion with patient.  Patient visited [facility] ED on 04/02/21 for 7 hours due to acute diastolic congestive heart failure.   New?Medications Started at [facility] Discharge:?? -started None.  Medication Changes at Hospital Discharge: -Changed None.  Medications Discontinued at Hospital Discharge: -Stopped None.   Medications that remain the same after Hospital Discharge:??  -All other medications will remain the same.    Medications: Outpatient Encounter Medications as of 04/22/2021  Medication Sig Note   ACCU-CHEK AVIVA PLUS test strip TEST BLOOD SUGAR ONE TO TWO TIMES DAILY    Accu-Chek Softclix Lancets lancets Test BS 1-2 times daily. Pt uses accu-chek meter    acetaminophen (TYLENOL) 500 MG tablet Take 1,000 mg by mouth every 6 (six) hours as needed for moderate pain or headache.    albuterol (PROVENTIL) (2.5 MG/3ML) 0.083% nebulizer solution USE 1 VIAL BY NEBULIZATION EVERY FOUR HOURS AS NEEDED FOR WHEEZING OR SHORTNESS OF BREATH. (Patient not taking: Reported on 04/04/2021)    albuterol (VENTOLIN HFA) 108 (90 Base) MCG/ACT inhaler Inhale 2 puffs into the lungs every 6 (six) hours as needed for wheezing or shortness of breath. (Patient not taking: Reported on 04/04/2021)    Alcohol Swabs (B-D SINGLE USE SWABS REGULAR) PADS  Use twice a day when checking blood sugars    bisoprolol-hydrochlorothiazide (ZIAC) 10-6.25 MG tablet Take 1 tablet by mouth daily.    CALCIUM  ACETATE-MAGNESIUM CARB PO Take 1 tablet by mouth daily. 04/04/2021: With zinc and vitamin D   cetirizine (ZYRTEC) 10 MG tablet Take 10 mg by mouth daily.    doxycycline (VIBRA-TABS) 100 MG tablet Take 1 tablet (100 mg total) by mouth 2 (two) times daily. 04/02/2021: Not taking yet ( wasn't ready at Rx)   furosemide (LASIX) 20 MG tablet TAKE 1 TABLET (20 MG TOTAL) BY MOUTH DAILY AS NEEDED FOR FLUID OR EDEMA. 04/04/2021: BID   guaiFENesin (MUCINEX) 600 MG 12 hr tablet Take 600 mg by mouth daily.    metFORMIN (GLUCOPHAGE-XR) 500 MG 24 hr tablet TAKE 1 TABLET THREE TIMES DAILY (Patient taking differently: Take 500 mg by mouth in the morning, at noon, and at bedtime.) 04/04/2021: Taking 2 daily   methocarbamol (ROBAXIN) 500 MG tablet Take 1-2 tablets (500-1,000 mg total) by mouth every 8 (eight) hours as needed for muscle spasms. (Patient not taking: No sig reported)    montelukast (SINGULAIR) 10 MG tablet Take 1 tablet (10 mg total) by mouth at bedtime.    Multiple Vitamin (MULTIVITAMIN) capsule Take 1 capsule by mouth daily.    simvastatin (ZOCOR) 20 MG tablet Take 1 tablet (20 mg total) by mouth at bedtime.    triamcinolone cream (KENALOG) 0.1 % Apply 1 application topically as needed (rash or redness).    TRUEplus Lancets 30G MISC TEST BLOOD SUGAR ONE TO TWO TIMES DAILY    umeclidinium-vilanterol (ANORO ELLIPTA) 62.5-25 MCG/INH AEPB Inhale 1 puff into the lungs daily.    valACYclovir (VALTREX) 500 MG tablet Take 1 tablet (500 mg total) by mouth daily.    No facility-administered encounter medications on file as of 04/22/2021.   Fill History: betamethasone, augmented 0.05% cream 04/10/2021 30   bisoprolol 10 mg-hydrochlorothiazide 6.25 mg tablet 02/08/2021 90   dexamethasone sod phos (bulk) 100 % powder 03/05/2021 30   MELOXICAM 15MG  TABLETS 02/18/2021 90   montelukast 10 mg tablet 02/08/2021 90   simvastatin 20 mg tablet 02/06/2021 90   TRIAMCINOLONE 0.1% CREAM 454GM 03/12/2021 30   Anoro  Ellipta 62.5 mcg-25 mcg/actuation powder for inhalation 03/28/2021 90   Dexameth Na Ph 0.4% Ionto 03/05/2021 30   hydroxyzine HCl 10 mg tablet 04/10/2021   metformin ER 500 mg tablet,extended release 24 hr 03/20/2021 90   valacyclovir 500 mg tablet 02/08/2021 90   Initial Questions: Have you seen any other providers since your last visit? Yes. She saw a PA with her urology office. Her dermatologist and on Wednesday this week a PA at her cardiology office.   Any changes in your medications or health? Other than eczema no.   Any side effects from any medications? No.   Do you have any symptoms or problems not managed by your medications? No. She feels her medications are working.   Any concerns about your health right now? Nothing besides the nirmal getting older issues.   Has your provider asked that you check blood pressure, blood sugar, or follow special diet at home? She checks her blood sugar everyday and she checks her blood pressure periodically and needs to lose weight.   Do you get any type of exercise on a regular basis? She does some rehab for her leg. She also cleans her home.   Can you think of a goal you would like to reach for your health?  Patient would like to lose some weight.   Do you have any problems getting your medications? No problems at this time.   Is there anything that you would like to discuss during the appointment? Nothing that she can think of.   Please bring medications and supplements to appointment.  Care Gaps:  AWV - scheduled for 05/08/21 Foot exam - overdue Covid-19 vaccine booster 5 - overdue since 12/20/20  Flu vaccine - due  Last PCP BP: 140/70 P: 76 Last A1C: 6.1  Star Rating Drugs:  Metformin 500mg  - last filled on 03/20/21 90DS at [facility] Simvastatin 20mg  - last filled on 02/06/21 90DS at [place] Pharmacist Assistant [phone]

## 2021-04-24 ENCOUNTER — Telehealth: Payer: Self-pay | Admitting: Pharmacist

## 2021-04-24 ENCOUNTER — Encounter: Payer: Self-pay | Admitting: Physician Assistant

## 2021-04-24 ENCOUNTER — Ambulatory Visit: Payer: Medicare HMO | Admitting: Physician Assistant

## 2021-04-24 ENCOUNTER — Other Ambulatory Visit: Payer: Self-pay

## 2021-04-24 VITALS — BP 130/67 | HR 92 | Ht 64.0 in | Wt 232.0 lb

## 2021-04-24 DIAGNOSIS — J449 Chronic obstructive pulmonary disease, unspecified: Secondary | ICD-10-CM

## 2021-04-24 DIAGNOSIS — Z952 Presence of prosthetic heart valve: Secondary | ICD-10-CM | POA: Diagnosis not present

## 2021-04-24 DIAGNOSIS — I2510 Atherosclerotic heart disease of native coronary artery without angina pectoris: Secondary | ICD-10-CM

## 2021-04-24 DIAGNOSIS — R609 Edema, unspecified: Secondary | ICD-10-CM | POA: Diagnosis not present

## 2021-04-24 DIAGNOSIS — I10 Essential (primary) hypertension: Secondary | ICD-10-CM

## 2021-04-24 MED ORDER — FUROSEMIDE 20 MG PO TABS
20.0000 mg | ORAL_TABLET | Freq: Every day | ORAL | 3 refills | Status: DC
Start: 2021-04-24 — End: 2022-03-19

## 2021-04-24 NOTE — Chronic Care Management (AMB) (Signed)
    Chronic Care Management Pharmacy Assistant   Name: Shannon Schultz  MRN: [id number] DOB: [birth year]  04/25/21 APPOINTMENT REMINDER   Shannon Schultz was reminded to have all medications, supplements and any blood glucose and blood pressure readings available for review with Jeni Salles, Pharm. D, at her telephone visit on 04/25/21 at 3pm.   Questions: Have you had any recent office visit or specialist visit outside of [place]? Yes.  Are there any concerns you would like to discuss during your office visit? Not at this time.  Are you having any problems obtaining your medications? No.  If patient has any PAP medications ask if they are having any problems getting their PAP medication or refill? None at this time.  Care Gaps:  AWV - scheduled for 05/08/21 Foot exam - overdue Covid-19 vaccine booster 5 - overdue since 12/20/20  Flu vaccine - due  Last PCP BP: 140/70 P: 76 Last A1C: 6.1  Star Rating Drug:  Metformin 500mg  - last filled on 03/20/21 90DS at [facility] Simvastatin 20mg  - last filled on 02/06/21 90DS at [facility]  Any gaps in medications fill history? No.  [place]  Clinical Pharmacist Assistant [phone]

## 2021-04-24 NOTE — Progress Notes (Signed)
Cardiology Office Note:    Date:  04/26/2021   ID:  Shannon Schultz, DOB 12/05/1945, MRN [id number]  PCP:  [name], MD   [facility] Providers Cardiologist:  Sanda Klein, MD     Referring MD: [name], MD   Chief Complaint  Patient presents with   Follow-up    Seen for Dr. Sallyanne Kuster    History of Present Illness:    Shannon Schultz is a 75 y.o. female with a hx of aortic stenosis s/p TAVR February 2022 using 26 mm Edwards SAPIEN 3 ultra via left subclavian approach, mild nonobstructive CAD on cath January 2022, hypertension, AAA, severe obesity, COPD/asthma and aortic atherosclerosis.  Echocardiogram obtained on 09/12/2020 showed EF 65 to 70%, mild LVH, grade 1 DD, mildly elevated pulmonary artery systolic pressure with RVSP 36.9 mmHg, trivial MR, stable prostatic aortic valve present in the aortic position.  Heart monitor obtained in April 2022 showed normal sinus rhythm with normal circadian variation, rare PACs and PVCs, there were also rare episode of nonsustained VT and nonsustained SVT.  Patient's symptom were more associated with VT and PVC events.  Even after the TAVR procedure, her dyspnea improved but not completely resolved.  She was last seen by Dr. Sallyanne Kuster on 01/10/2021 at which time she was doing well.  Patient was instructed to continue aspirin and Plavix through August then Plavix can be discontinued after that.  Previous CTA of the chest obtained in January of this year did mention possible cirrhosis and splenomegaly, she was suspected to have NASH cirrhosis.  More recently, patient presented to the ED on 04/02/2021 after sent there by her PCP due to elevated D-dimer.  She was complaining of increasing dyspnea as well.  She also noted worsening bilateral lower extremity edema.  Lower extremity venous Doppler and VQ scan were negative.  Her symptom was suspected to be more related to CHF exacerbation.  She was instructed to take 20 mg extra dose of Lasix for 3 to 5 days  after discharge and to follow-up with the cardiology service.  She has had a new left bundle branch block since July 2022.  Talking with the patient today, she has been Lasix daily since discharge.  We will obtain a basic metabolic panel today.  She continues to have trace amount of lower extremity edema.  Her lungs is clear.  Heart rate regular however does have a heart murmur near the aortic valve area.  She is due for repeat echocardiogram in February of next year prior to follow-up with Dr. Sallyanne Kuster.  Otherwise, I recommended continue on the current therapy.  Past Medical History:  Diagnosis Date   Arthritis    "hands" (05/15/2017)   Bladder cancer ([place]) 2018   Breast cancer, right ([place]) 1992   DCIS,bladder ca (just dx)   Colon polyp    Complication of anesthesia 1992   "local anesthesia" used was hard to awaken from-no problems since (05/15/2017)   COPD (chronic obstructive pulmonary disease) (HCC)    Coronary artery disease    COVID-19 virus infection 05/05/2019   Diverticulosis    Elevated liver enzymes    fatty liver per ultrasound per pt   FHx: BRCA2 gene positive    sister with BRCA2 mutation (pt tested NEGATIVE)   HLD (hyperlipidemia)    HSV (herpes simplex virus) infection    on hip--on daily suppression   Hypertension    Hypothyroidism    took med 7 yrs after birth of 1st child  Impaired glucose tolerance    Migraine    On home oxygen therapy    "have it available but I'm not using it" (05/15/2017)   Osteopenia    S/P TAVR (transcatheter aortic valve replacement) 08/14/2020   s/p TAVR with a 26 mm Edwards S3U via the left subclavian approach by Dr. Roxy Manns and Dr. Angelena Form   Severe aortic stenosis    Sleep apnea    Sleeps with 2 L O2 @ night   Type 2 diabetes mellitus ([place])    Ventral hernia    Vitamin D deficiency disease     Past Surgical History:  Procedure Laterality Date   ABDOMINAL HERNIA REPAIR  05/15/2017   BREAST BIOPSY Right 1992   BREAST BIOPSY  Left 2018   BREAST EXCISIONAL BIOPSY Left 2018   ATYPICAL DUCTAL HYPERPLASIA INVOLVING A COMPLEX   BREAST LUMPECTOMY WITH RADIOACTIVE SEED LOCALIZATION Left 12/22/2016   Procedure: LEFT BREAST LUMPECTOMY WITH RADIOACTIVE SEED LOCALIZATION;  Surgeon: Jovita Kussmaul, MD;  Location: [place];  Service: General;  Laterality: Left;   CARDIAC CATHETERIZATION     CATARACT EXTRACTION, BILATERAL Bilateral R 03/16/2019 L 03/30/2019   Dr. Kathlen Mody   COLONOSCOPY  2009, 08/2010, 12/2015   Dr. Collene Mares; "only 1 had any polyps" (05/15/2017)   CYSTOSCOPY  04/23/2017   CYSTOSCOPY W/ RETROGRADES Bilateral 01/12/2017   Procedure: CYSTOSCOPY WITH RETROGRADE PYELOGRAM/ EXAM UNDER ANESTHESIA;  Surgeon: Raynelle Bring, MD;  Location: [facility];  Service: Urology;  Laterality: Bilateral;   EYE SURGERY     FEMUR IM NAIL Right 08/24/2019   Procedure: INTRAMEDULLARY (IM) NAIL FEMORAL;  Surgeon: Rod Can, MD;  Location: [facility];  Service: Orthopedics;  Laterality: Right;   HERNIA REPAIR     INSERTION OF MESH N/A 05/15/2017   Procedure: INSERTION OF MESH;  Surgeon: Jovita Kussmaul, MD;  Location: [facility];  Service: General;  Laterality: N/A;   LAPAROSCOPIC CHOLECYSTECTOMY  2003   MASTECTOMY Right 1992   RIGHT/LEFT HEART CATH AND CORONARY ANGIOGRAPHY N/A 07/05/2020   Procedure: RIGHT/LEFT HEART CATH AND CORONARY ANGIOGRAPHY;  Surgeon: Burnell Blanks, MD;  Location: [facility];  Service: Cardiovascular;  Laterality: N/A;   TEE WITHOUT CARDIOVERSION N/A 08/14/2020   Procedure: TRANSESOPHAGEAL ECHOCARDIOGRAM (TEE);  Surgeon: Burnell Blanks, MD;  Location: [facility];  Service: Open Heart Surgery;  Laterality: N/A;   TRANSURETHRAL RESECTION OF BLADDER TUMOR WITH MITOMYCIN-C N/A 01/12/2017   Procedure: TRANSURETHRAL RESECTION OF BLADDER TUMOR WITH POSSIBLE POST OPERATIVE INSTILLATION OF MITOMYCIN-C;  Surgeon: Raynelle Bring, MD;  Location: [facility];  Service: Urology;  Laterality: N/A;   TYMPANOSTOMY TUBE PLACEMENT Bilateral  6063K   UMBILICAL HERNIA REPAIR  2003   VENTRAL HERNIA REPAIR N/A 05/15/2017   Procedure: VENTRAL HERNIA REPAIR WITH MESH;  Surgeon: Autumn Messing III, MD;  Location: [facility];  Service: General;  Laterality: N/A;    Current Medications: Current Meds  Medication Sig   ACCU-CHEK AVIVA PLUS test strip TEST BLOOD SUGAR ONE TO TWO TIMES DAILY   acetaminophen (TYLENOL) 500 MG tablet Take 1,000 mg by mouth every 6 (six) hours as needed for moderate pain or headache.   albuterol (PROVENTIL) (2.5 MG/3ML) 0.083% nebulizer solution USE 1 VIAL BY NEBULIZATION EVERY FOUR HOURS AS NEEDED FOR WHEEZING OR SHORTNESS OF BREATH.   albuterol (VENTOLIN HFA) 108 (90 Base) MCG/ACT inhaler Inhale 2 puffs into the lungs every 6 (six) hours as needed for wheezing or shortness of breath.   Alcohol Swabs (B-D SINGLE USE SWABS  REGULAR) PADS Use twice a day when checking blood sugars   bisoprolol-hydrochlorothiazide (ZIAC) 10-6.25 MG tablet Take 1 tablet by mouth daily.   CALCIUM ACETATE-MAGNESIUM CARB PO Take 1 tablet by mouth daily.   cetirizine (ZYRTEC) 10 MG tablet Take 10 mg by mouth daily.   guaiFENesin (MUCINEX) 600 MG 12 hr tablet Take 1,200 mg by mouth daily.   metFORMIN (GLUCOPHAGE-XR) 500 MG 24 hr tablet TAKE 1 TABLET THREE TIMES DAILY (Patient taking differently: Take 500 mg by mouth in the morning, at noon, and at bedtime.)   montelukast (SINGULAIR) 10 MG tablet Take 1 tablet (10 mg total) by mouth at bedtime.   simvastatin (ZOCOR) 20 MG tablet Take 1 tablet (20 mg total) by mouth at bedtime.   triamcinolone cream (KENALOG) 0.1 % Apply 1 application topically as needed (rash or redness).   TRUEplus Lancets 30G MISC TEST BLOOD SUGAR ONE TO TWO TIMES DAILY   umeclidinium-vilanterol (ANORO ELLIPTA) 62.5-25 MCG/INH AEPB Inhale 1 puff into the lungs daily.   valACYclovir (VALTREX) 500 MG tablet Take 1 tablet (500 mg total) by mouth daily.   [DISCONTINUED] Accu-Chek Softclix Lancets lancets Test BS 1-2 times daily. Pt  uses accu-chek meter   [DISCONTINUED] furosemide (LASIX) 20 MG tablet TAKE 1 TABLET (20 MG TOTAL) BY MOUTH DAILY AS NEEDED FOR FLUID OR EDEMA.   [DISCONTINUED] Multiple Vitamin (MULTIVITAMIN) capsule Take 1 capsule by mouth daily.     Allergies:   Contrast media [iodinated diagnostic agents], Iohexol, Lisinopril, Sulfa antibiotics, Latex, Nickel, Codeine, Levofloxacin, Lipitor [atorvastatin calcium], Meloxicam, and Vicodin [hydrocodone-acetaminophen]   Social History   Socioeconomic History   Marital status: Married    Spouse name: Not on file   Number of children: 3   Years of education: Not on file   Highest education level: Not on file  Occupational History   Occupation: Retired  Tobacco Use   Smoking status: Former    Packs/day: 1.00    Years: 46.00    Pack years: 46.00    Types: Cigarettes    Quit date: 05/31/2011    Years since quitting: 9.9   Smokeless tobacco: Never  Vaping Use   Vaping Use: Never used  Substance and Sexual Activity   Alcohol use: No   Drug use: No   Sexual activity: Yes    Partners: Male    Birth control/protection: Post-menopausal  Other Topics Concern   Not on file  Social History Narrative   Lives with her husband.  Children all live in [place] nearby. No pets. 6 grandchildren.   Social Determinants of Health   Financial Resource Strain: Low Risk    Difficulty of Paying Living Expenses: Not very hard  Food Insecurity: Not on file  Transportation Needs: No Transportation Needs   Lack of Transportation (Medical): No   Lack of Transportation (Non-Medical): No  Physical Activity: Not on file  Stress: Not on file  Social Connections: Not on file     Family History: The patient's family history includes Allergies in her sister and sister; Asthma in her sister and sister; Breast cancer (age of onset: 21) in her sister; Cancer in her maternal grandmother and another family member; Diabetes in her father; Fibromyalgia in her sister and sister;  Hashimoto's thyroiditis in her sister and sister; Heart disease in her father and mother; Hyperlipidemia in her sister and sister; Hypertension in her father; Stroke (age of onset: 22) in her paternal grandmother; Tuberculosis in her maternal grandfather.  ROS:   Please see the  history of present illness.     All other systems reviewed and are negative.  EKGs/Labs/Other Studies Reviewed:    The following studies were reviewed today:  Echo 09/12/2020  1. Left ventricular ejection fraction, by estimation, is 65 to 70%. The  left ventricle has normal function. The left ventricle has no regional  wall motion abnormalities. There is mild left ventricular hypertrophy.  Left ventricular diastolic parameters  are consistent with Grade I diastolic dysfunction (impaired relaxation).   2. Right ventricular systolic function is normal. The right ventricular  size is normal. There is mildly elevated pulmonary artery systolic  pressure. The estimated right ventricular systolic pressure is 97.4 mmHg.   3. The mitral valve is normal in structure. Trivial mitral valve  regurgitation.   4. The inferior vena cava is normal in size with greater than 50%  respiratory variability, suggesting right atrial pressure of 3 mmHg.   5. There is a 26 mm Edwards Sapien prosthetic, stented (TAVR) valve  present in the aortic position. Aortic valve regurgitation is not  visualized. Echo findings are consistent with normal structure and  function of the aortic valve prosthesis. Vmax 2.5  m/s, AT 65m, MG 12 mmHg, EOA 1.4 cm^2, DI 0.4   EKG:  EKG is not ordered today.   Recent Labs: 08/15/2020: Magnesium 1.9 04/02/2021: ALT 51; B Natriuretic Peptide 55.4; Hemoglobin 13.4; Platelets 100 04/24/2021: BUN 15; Creatinine, Ser 0.80; Potassium 4.4; Sodium 145  Recent Lipid Panel    Component Value Date/Time   CHOL 130 10/22/2020 0840   TRIG 86 10/22/2020 0840   HDL 54 10/22/2020 0840   CHOLHDL 2.4 10/22/2020 0840    CHOLHDL 2.2 08/18/2016 1346   VLDL 19 08/18/2016 1346   LDLCALC 59 10/22/2020 0840     Risk Assessment/Calculations:           Physical Exam:    VS:  BP 130/67   Pulse 92   Ht '5\' 4"'  (1.626 m)   Wt 232 lb (105.2 kg)   LMP  (LMP Unknown)   SpO2 95%   BMI 39.82 kg/m     Wt Readings from Last 3 Encounters:  04/24/21 232 lb (105.2 kg)  04/04/21 228 lb 3.2 oz (103.5 kg)  04/02/21 231 lb 9.6 oz (105.1 kg)     GEN:  Well nourished, well developed in no acute distress HEENT: Normal NECK: No JVD; No carotid bruits LYMPHATICS: No lymphadenopathy CARDIAC: RRR, no murmurs, rubs, gallops RESPIRATORY:  Clear to auscultation without rales, wheezing or rhonchi  ABDOMEN: Soft, non-tender, non-distended MUSCULOSKELETAL:  No edema; No deformity  SKIN: Warm and dry NEUROLOGIC:  Alert and oriented x 3 PSYCHIATRIC:  Normal affect   ASSESSMENT:    1. Peripheral edema   2. S/P TAVR (transcatheter aortic valve replacement)   3. Essential hypertension, benign   4. Coronary artery disease involving native coronary artery of native heart without angina pectoris   5. Chronic obstructive pulmonary disease, unspecified COPD type ([place]    PLAN:    In order of problems listed above:  Peripheral edema: Patient was recently seen in the ED for acute heart failure exacerbation and was placed on diuretic.  She is currently taking 20 mg daily of Lasix instead of as needed.  I will obtain a basic metabolic panel to follow-up on renal function.  She only has trace amount of edema in the lower extremity.  Her lung is clear at this point.  We will continue on the current therapy  History  of TAVR: Due for repeat echocardiogram in February 2023  Hypertension: Blood pressure stable on current therapy  CAD: Mild nonobstructive CAD on previous cardiac catheterization earlier this year  COPD: No acute exacerbation.        Medication Adjustments/Labs and Tests Ordered: Current medicines are reviewed  at length with the patient today.  Concerns regarding medicines are outlined above.  Orders Placed This Encounter  Procedures   Basic Metabolic Panel (BMET)   ECHOCARDIOGRAM COMPLETE   Meds ordered this encounter  Medications   furosemide (LASIX) 20 MG tablet    Sig: Take 1 tablet (20 mg total) by mouth daily.    Dispense:  90 tablet    Refill:  3    Patient Instructions  Medication Instructions:  CHANGE- Furosemide 20 mg by mouth daily  *If you need a refill on your cardiac medications before your next appointment, please call your pharmacy*   Lab Work: BMP Today  If you have labs (blood work) drawn today and your tests are completely normal, you will receive your results only by: [place] (if you have MyChart) OR A paper copy in the mail If you have any lab test that is abnormal or we need to change your treatment, we will call you to review the results.   Testing/Procedures: Your physician has requested that you have an echocardiogram February 2023. Echocardiography is a painless test that uses sound waves to create images of your heart. It provides your doctor with information about the size and shape of your heart and how well your heart's chambers and valves are working. This procedure takes approximately one hour. There are no restrictions for this procedure.   Follow-Up: At [facility], you and your health needs are our priority.  As part of our continuing mission to provide you with exceptional heart care, we have created designated Provider Care Teams.  These Care Teams include your primary Cardiologist (physician) and Advanced Practice Providers (APPs -  Physician Assistants and Nurse Practitioners) who all work together to provide you with the care you need, when you need it.  We recommend signing up for the patient portal called "MyChart".  Sign up information is provided on this After Visit Summary.  MyChart is used to connect with patients for Virtual  Visits (Telemedicine).  Patients are able to view lab/test results, encounter notes, upcoming appointments, etc.  Non-urgent messages can be sent to your provider as well.   To learn more about what you can do with MyChart, go to [email or web address].    Your next appointment:   4 month(s)  The format for your next appointment:   In Person  Provider:   You may see Sanda Klein, MD or one of the following Advanced Practice Providers on your designated Care Team:   Almyra Deforest, PA-C Fabian Sharp, PA-C or  Roby Lofts, PA-C     Signed, [place], [place]  04/26/2021 11:40 PM    [place]

## 2021-04-24 NOTE — Patient Instructions (Signed)
Medication Instructions:  CHANGE- Furosemide 20 mg by mouth daily  *If you need a refill on your cardiac medications before your next appointment, please call your pharmacy*   Lab Work: BMP Today  If you have labs (blood work) drawn today and your tests are completely normal, you will receive your results only by: [place] (if you have MyChart) OR A paper copy in the mail If you have any lab test that is abnormal or we need to change your treatment, we will call you to review the results.   Testing/Procedures: Your physician has requested that you have an echocardiogram February 2023. Echocardiography is a painless test that uses sound waves to create images of your heart. It provides your doctor with information about the size and shape of your heart and how well your heart's chambers and valves are working. This procedure takes approximately one hour. There are no restrictions for this procedure.   Follow-Up: At [facility], you and your health needs are our priority.  As part of our continuing mission to provide you with exceptional heart care, we have created designated Provider Care Teams.  These Care Teams include your primary Cardiologist (physician) and Advanced Practice Providers (APPs -  Physician Assistants and Nurse Practitioners) who all work together to provide you with the care you need, when you need it.  We recommend signing up for the patient portal called "MyChart".  Sign up information is provided on this After Visit Summary.  MyChart is used to connect with patients for Virtual Visits (Telemedicine).  Patients are able to view lab/test results, encounter notes, upcoming appointments, etc.  Non-urgent messages can be sent to your provider as well.   To learn more about what you can do with MyChart, go to [email or web address].    Your next appointment:   4 month(s)  The format for your next appointment:   In Person  Provider:   You may see Sanda Klein, MD or one of the following Advanced Practice Providers on your designated Care Team:   Almyra Deforest, PA-C Fabian Sharp, PA-C or  Roby Lofts, [place]

## 2021-04-25 ENCOUNTER — Other Ambulatory Visit: Payer: Self-pay

## 2021-04-25 ENCOUNTER — Ambulatory Visit (INDEPENDENT_AMBULATORY_CARE_PROVIDER_SITE_OTHER): Payer: Medicare HMO | Admitting: Pharmacist

## 2021-04-25 DIAGNOSIS — E7800 Pure hypercholesterolemia, unspecified: Secondary | ICD-10-CM

## 2021-04-25 DIAGNOSIS — E118 Type 2 diabetes mellitus with unspecified complications: Secondary | ICD-10-CM

## 2021-04-25 LAB — BASIC METABOLIC PANEL
BUN/Creatinine Ratio: 19 (ref 12–28)
BUN: 15 mg/dL (ref 8–27)
CO2: 25 mmol/L (ref 20–29)
Calcium: 10 mg/dL (ref 8.7–10.3)
Chloride: 101 mmol/L (ref 96–106)
Creatinine, Ser: 0.8 mg/dL (ref 0.57–1.00)
Glucose: 227 mg/dL — ABNORMAL HIGH (ref 70–99)
Potassium: 4.4 mmol/L (ref 3.5–5.2)
Sodium: 145 mmol/L — ABNORMAL HIGH (ref 134–144)
eGFR: 77 mL/min/{1.73_m2} (ref >59)

## 2021-04-25 MED ORDER — ACCU-CHEK SOFTCLIX LANCETS MISC: 1 refills | Status: DC

## 2021-04-25 NOTE — Progress Notes (Signed)
Stable renal function and potassium level on the current diuretic dosing.

## 2021-04-25 NOTE — Progress Notes (Signed)
Chronic Care Management Pharmacy Note  04/25/2021 Name:  Shannon Schultz MRN:  [id number] DOB:  04/02/1946  Summary: A1c at goal < 7% but recent home readings have been higher LDL at goal < 70  Recommendations/Changes made from today's visit: -Recommend switching metformin to GLP1 (Ozempic) for weight loss and ASCVD benefits -Recommended weekly BP monitoring and bringing her cuff to her next appt -Consider repeat DEXA based on hip fracture  Plan: Follow up after PCP visit for medication changes Apply for PAP for Anoro for 2023   Subjective: Shannon Schultz is an 75 y.o. year old female who is a primary patient of [name], Schultz.  The CCM team was consulted for assistance with disease management and care coordination needs.    Engaged with patient by telephone for initial visit in response to provider referral for pharmacy case management and/or care coordination services.   Consent to Services:  The patient was given the following information about Chronic Care Management services today, agreed to services, and gave verbal consent: 1. CCM service includes personalized support from designated clinical staff supervised by the primary care provider, including individualized plan of care and coordination with other care providers 2. 24/7 contact phone numbers for assistance for urgent and routine care needs. 3. Service will only be billed when office clinical staff spend 20 minutes or more in a month to coordinate care. 4. Only one practitioner may furnish and bill the service in a calendar month. 5.The patient may stop CCM services at any time (effective at the end of the month) by phone call to the office staff. 6. The patient will be responsible for cost sharing (co-pay) of up to 20% of the service fee (after annual deductible is met). Patient agreed to services and consent obtained.  Patient Care Team: [name], Schultz as PCP - General (Family Medicine) Sanda Klein, Schultz as PCP -  Cardiology (Cardiology) [name], Schultz as Referring Physician (Family Medicine) Viona Gilmore, [facility] as Pharmacist (Pharmacist)  Recent office visits: 04/04/21 [name] Schultz (PCP) - seen for acute cystitis with hematuria and other chronic conditions. Decrease furosemide to just 1 tablet daily. Increase back if needed. Follow up with Dr. Sallyanne Kuster.    04/01/21 Shannon Henson PA-C (Family Medicine) - seen for acute cystitis with hematuria and other issues. Patient started on doxycycline 120m 2 times daily. Follow up as needed.    01/29/21 Shannon Schultz (Family Medicine) - seen for acute cystitis with hematuria and other issues. Patient started on doxycycline 1063m2 times daily. Follow up in 2 weeks.    10/25/20 Shannon Schultz (Family Medicine) - seen for type 2 diabetes mellitus and other chronic conditions. Patient started on methocarbamol [phone] 8 hours as needed. Follow up for fasting labs.     Recent consult visits: 03/29/21 Shannon Schultz (Pulmonology) - seen for COPD group B. Changed umeclidinium- Vilanterol 62.5- 71m56m puff daily. Discontinued amoxicillin, aspirin and doxycycline. Follow up in 3 months.    02/18/21 Shannon Schultz for displaced intertrochanteric fracture of right femur, subsequent encounter for closed fracture with routine healing and other issues. No medication changes or follow up noted.    01/10/21 Shannon Schultz (Cardiology) - seen for nonrheumatic aortic valve stenosis and other issues. Discontinued aspirin-caffeine 845-65mg71macket oral daily. Follow up for 12 months.    11/23/20 Shannon Corkthalmology) - seen for presence of intraocular lens and other issues. No medication changes. No follow  up noted.    10/23/20 Shannon Rodman Pickle Schultz (Pulmonology) - seen for COPD group B by GOLD 2017 classification. Discontinued fluticasone. Follow up in 3 months.   Hospital visits: Medication Reconciliation was completed by comparing discharge  summary, patient's EMR and Pharmacy list, and upon discussion with patient.   Patient visited [facility] ED on 04/02/21 for 7 hours due to acute diastolic congestive heart failure.    New?Medications Started at [facility] Discharge:?? -started None.   Medication Changes at Hospital Discharge: -Changed None.   Medications Discontinued at Hospital Discharge: -Stopped None.    Medications that remain the same after Hospital Discharge:??  -All other medications will remain the same.     Objective:  Lab Results  Component Value Date   CREATININE 0.80 04/24/2021   BUN 15 04/24/2021   GFRNONAA >60 04/02/2021   GFRAA 97 07/02/2020   NA 145 (H) 04/24/2021   K 4.4 04/24/2021   CALCIUM 10.0 04/24/2021   CO2 25 04/24/2021   GLUCOSE 227 (H) 04/24/2021    Lab Results  Component Value Date/Time   HGBA1C 6.1 (H) 10/22/2020 08:40 AM   HGBA1C 5.4 04/23/2020 08:49 AM   MICROALBUR 2.6 08/25/2016 12:14 PM   MICROALBUR 0.3 07/19/2015 12:01 AM    Last diabetic Eye exam:  Lab Results  Component Value Date/Time   HMDIABEYEEXA No Retinopathy 11/23/2020 12:00 AM    Last diabetic Foot exam: No results found for: HMDIABFOOTEX   Lab Results  Component Value Date   CHOL 130 10/22/2020   HDL 54 10/22/2020   LDLCALC 59 10/22/2020   TRIG 86 10/22/2020   CHOLHDL 2.4 10/22/2020    Hepatic Function Latest Ref Rng & Units 04/02/2021 10/22/2020 08/10/2020  Total Protein 6.5 - 8.1 g/dL 6.8 6.4 6.4(L)  Albumin 3.5 - 5.0 g/dL 3.6 4.3 3.8  AST 15 - 41 U/L 37 29 32  ALT 0 - 44 U/L 51(H) 23 23  Alk Phosphatase 38 - 126 U/L 60 72 54  Total Bilirubin 0.3 - 1.2 mg/dL 0.8 0.5 0.8  Bilirubin, Direct 0.0 - 0.3 mg/dL - - -    Lab Results  Component Value Date/Time   TSH 3.150 04/23/2020 08:49 AM   TSH 2.600 04/04/2019 08:30 AM    CBC Latest Ref Rng & Units 04/02/2021 04/01/2021 08/15/2020  WBC 4.0 - 10.5 K/uL 3.9(L) 5.1 6.8  Hemoglobin 12.0 - 15.0 g/dL 13.4 13.0 11.3(L)  Hematocrit  36.0 - 46.0 % 42.1 38.4 35.4(L)  Platelets 150 - 400 K/uL 100(L) 95(LL) 117(L)    Lab Results  Component Value Date/Time   VD25OH 34.27 08/24/2019 01:56 AM   VD25OH 69 05/16/2011 08:44 AM    Clinical ASCVD: Yes  The 10-year ASCVD risk score (Arnett DK, et al., 2019) is: 32.8%   Values used to calculate the score:     Age: 72 years     Sex: Female     Is Non-Hispanic African American: No     Diabetic: Yes     Tobacco smoker: No     Systolic Blood Pressure: 161 mmHg     Is BP treated: Yes     HDL Cholesterol: 54 mg/dL     Total Cholesterol: 130 mg/dL    Depression screen [facility] 2/9 10/25/2020 04/26/2020 04/07/2019  Decreased Interest 1 0 0  Down, Depressed, Hopeless 1 0 0  PHQ - 2 Score 2 0 0  Some recent data might be hidden     Social History   Tobacco  Use  Smoking Status Former   Packs/day: 1.00   Years: 46.00   Pack years: 46.00   Types: Cigarettes   Quit date: 05/31/2011   Years since quitting: 9.9  Smokeless Tobacco Never   BP Readings from Last 3 Encounters:  04/24/21 130/67  04/04/21 140/70  04/02/21 (!) 189/86   Pulse Readings from Last 3 Encounters:  04/24/21 92  04/04/21 76  04/02/21 84   Wt Readings from Last 3 Encounters:  04/24/21 232 lb (105.2 kg)  04/04/21 228 lb 3.2 oz (103.5 kg)  04/02/21 231 lb 9.6 oz (105.1 kg)   BMI Readings from Last 3 Encounters:  04/24/21 39.82 kg/m  04/04/21 39.17 kg/m  04/02/21 39.75 kg/m    Assessment/Interventions: Review of patient past medical history, allergies, medications, health status, including review of consultants reports, laboratory and other test data, was performed as part of comprehensive evaluation and provision of chronic care management services.   SDOH:  (Social Determinants of Health) assessments and interventions performed: Yes SDOH Interventions    Flowsheet Row Most Recent Value  SDOH Interventions   Financial Strain Interventions Other (Comment)  [Will work on [employer] PAP next year]   Transportation Interventions Intervention Not Indicated      SDOH Screenings   Alcohol Screen: Not on file  Depression (PHQ2-9): Low Risk    PHQ-2 Score: 2  Financial Resource Strain: Low Risk    Difficulty of Paying Living Expenses: Not very hard  Food Insecurity: Not on file  Housing: Not on file  Physical Activity: Not on file  Social Connections: Not on file  Stress: Not on file  Tobacco Use: Medium Risk   Smoking Tobacco Use: Former   Smokeless Tobacco Use: Never   Passive Exposure: Not on file  Transportation Needs: No Transportation Needs   Lack of Transportation (Medical): No   Lack of Transportation (Non-Medical): No   Patient gets up, cooks breakfast and does her own laundry, and then cleans up around the house. She is a morning person. Patient is involved in circle at church and used to read a lot but hasn't found many good books at [employer]. She used to read on her iPad too but it is now outdated.  Patient prepares the meals at home as her husband doesn't like to go out to eat. She usually cooks meat and 2 vegetables. She mostly eats chicken and beef and recently just started buying frozen meals as requested by her husband. She doesn't eat many desserts and drinks milk, sometimes, and mostly coffee and water.   Patient is doing hip rehab exercises almost every day. She fell a year ago and broke her hip and did PT afterwards and is still doing those exercises. She is also walking with a walker and feels like her leg may give way. She does not feel comfortable without the walker.  Patient sleeps in recliner with oxygen. She reports it hurts to sleep in bed and is a side sleeper. She has bursitis in both hips and doesn't waking up during the night. She also doesn't usually nap during the day.  Patient denies problems with medications other than cost. She is having a hard time affording Anoro when in the donut hole but she has enough to last until the end of the year at  this point. She did qualify for PAP with [employer] in the past but forgot to apply this year.  Patient is hoping to lose more weight again.  CCM Care Plan  Allergies  Allergen Reactions   Contrast Media [Iodinated Diagnostic Agents] Anaphylaxis, Shortness Of Breath and Other (See Comments)    Could not breath   Iohexol Anaphylaxis, Shortness Of Breath and Other (See Comments)    Immediately could not breathe   Lisinopril Anaphylaxis, Shortness Of Breath and Rash   Sulfa Antibiotics Anaphylaxis and Other (See Comments)    Historical from mother, pt states that mother says she almost died from this drug   Latex Other (See Comments)    Unless against on skin for a long time, blisters. Short term is okay.   Nickel Other (See Comments)    With earrings pt has soreness and drainage from piercing   Codeine Nausea Only, Anxiety and Other (See Comments)    insomnia   Levofloxacin Other (See Comments)    insomnia   Lipitor [Atorvastatin Calcium] Rash   Meloxicam Rash    Broke out in a rash on her stomach,back, legs, and behind ears.   Vicodin [Hydrocodone-Acetaminophen] Itching    Medications Reviewed Today     Reviewed by Viona Gilmore, [facility] (Pharmacist) on 04/25/21 at 1551  Med List Status: <None>   Medication Order Taking? Sig Documenting Provider Last Dose Status Informant  ACCU-CHEK AVIVA PLUS test strip [id number]  TEST BLOOD SUGAR ONE TO TWO TIMES DAILY [name], Schultz  Active Self  Accu-Chek Softclix Lancets lancets [id number]  Test BS 1-2 times daily. Pt uses accu-chek meter [name], Schultz  Active   acetaminophen (TYLENOL) 500 MG tablet [id number] Yes Take 1,000 mg by mouth every 6 (six) hours as needed for moderate pain or headache. Provider, Historical, Schultz Taking Active   albuterol (PROVENTIL) (2.5 MG/3ML) 0.083% nebulizer solution [id number] Yes USE 1 VIAL BY NEBULIZATION EVERY FOUR HOURS AS NEEDED FOR WHEEZING OR SHORTNESS OF BREATH. Tysinger, Camelia Eng, PA-C Taking Active   albuterol  (VENTOLIN HFA) 108 (90 Base) MCG/ACT inhaler [id number] Yes Inhale 2 puffs into the lungs every 6 (six) hours as needed for wheezing or shortness of breath. [name], Schultz Taking Active            Med Note Constance Haw, Elease Etienne Aug 22, 2020  1:13 PM)    Alcohol Swabs (B-D SINGLE USE SWABS REGULAR) PADS [id number]  Use twice a day when checking blood sugars [name], Schultz  Active Self  aspirin EC 81 MG tablet [id number] Yes Take 81 mg by mouth daily. Swallow whole. Provider, Historical, Schultz Taking Active   bisoprolol-hydrochlorothiazide [facility]) 10-6.25 MG tablet [id number] Yes Take 1 tablet by mouth daily. [name], Schultz Taking Active Self  CALCIUM ACETATE-MAGNESIUM CARB PO [id number] Yes Take 1 tablet by mouth daily. Provider, Historical, Schultz Taking Active Self           Med Note Tamala Julian, Verlin Dike Apr 04, 2021  1:28 PM) With zinc and vitamin D  cetirizine (ZYRTEC) 10 MG tablet [id number] Yes Take 10 mg by mouth daily. Provider, Historical, Schultz Taking Active Self  furosemide (LASIX) 20 MG tablet [id number] Yes Take 1 tablet (20 mg total) by mouth daily. Almyra Deforest, [place] Taking Active   guaiFENesin (MUCINEX) 600 MG 12 hr tablet [id number] Yes Take 1,200 mg by mouth daily. Provider, Historical, Schultz Taking Active Self  metFORMIN (GLUCOPHAGE-XR) 500 MG 24 hr tablet [id number] Yes TAKE 1 TABLET THREE TIMES DAILY  Patient taking differently: Take 500 mg by mouth in the morning, at noon, and at bedtime.   [name], Schultz Taking Active  Med Note Pincus Large Apr 04, 2021  1:28 PM) Taking 2 daily  montelukast (SINGULAIR) 10 MG tablet [id number] Yes Take 1 tablet (10 mg total) by mouth at bedtime. [name], Schultz Taking Active Self  simvastatin (ZOCOR) 20 MG tablet [id number] Yes Take 1 tablet (20 mg total) by mouth at bedtime. [name], Schultz Taking Active Self  triamcinolone cream (KENALOG) 0.1 % [id number] Yes Apply 1 application topically as needed (rash or redness). Provider,  Historical, Schultz Taking Active Self  TRUEplus Lancets 30G [place] [id number]  TEST BLOOD SUGAR ONE TO TWO TIMES DAILY [name], Schultz  Active Self  umeclidinium-vilanterol [facility] ELLIPTA) 62.5-25 MCG/INH AEPB [id number] Yes Inhale 1 puff into the lungs daily. [name], Schultz Taking Active Self  valACYclovir (VALTREX) 500 MG tablet [id number] Yes Take 1 tablet (500 mg total) by mouth daily. [name], Schultz Taking Active Self            Patient Active Problem List   Diagnosis Date Noted   Nocturnal hypoxemia 11/12/2020   S/P TAVR (transcatheter aortic valve replacement) 08/14/2020   Severe aortic stenosis    Contrast media allergy 06/26/2020   AAA (abdominal aortic aneurysm) without rupture 06/26/2020   Upper airway cough syndrome 12/20/2019   Hip fracture ([place]) 08/25/2019   Venous insufficiency 09/10/2018   Sinusitis 06/25/2018   Ophthalmic migraine 03/16/2018   COPD, group B, by GOLD 2017 classification ([place]) 09/30/2017   Chronic hypoxemic respiratory failure ([place]) 09/29/2017   Ventral hernia without obstruction or gangrene 05/15/2017   Cirrhosis of liver without ascites ([place]) 03/11/2017   Urothelial cancer ([place]) 03/10/2017   Atherosclerosis of coronary artery 09/14/2016   Aortic atherosclerosis ([place]) 09/14/2016   Class 2 severe obesity due to excess calories with serious comorbidity and body mass index (BMI) of 39.0 to 39.9 in adult ([place]) 11/10/2015   Thrombocytopenia ([place]) 08/17/2015   Elevated troponin 08/17/2015   Diabetes mellitus type 2 in obese ([place]) 08/17/2015   Severe obesity (BMI >= 40) ([place]) 10/06/2014   FHx: BRCA2 gene positive    HSV (herpes simplex virus) infection 06/07/2012   Pure hypercholesterolemia 11/13/2010   Essential hypertension, benign 11/13/2010    Immunization History  Administered Date(s) Administered   Fluad Quad(high Dose 65+) 04/07/2019, 04/26/2020   Influenza Split 05/28/2011   Influenza, High Dose Seasonal PF 04/07/2013, 05/15/2014,  05/30/2015, 03/25/2016, 03/05/2017, 04/01/2018   Influenza, Seasonal, Injecte, Preservative Fre 06/07/2012   PFIZER Comirnaty(Gray Top)Covid-19 Tri-Sucrose Vaccine 10/25/2020   PFIZER(Purple Top)SARS-COV-2 Vaccination 08/20/2019, 09/13/2019, 04/14/2020   Pneumococcal Conjugate-13 07/06/2014   Pneumococcal Polysaccharide-23 12/15/2004, 05/28/2011   Tdap 02/23/2008, 06/15/2018   Zoster Recombinat (Shingrix) 12/02/2017, 02/06/2018   Zoster, Live 05/17/2010    Conditions to be addressed/monitored:  Hypertension, Hyperlipidemia, Diabetes, COPD, and Allergic Rhinitis  Care Plan : [place]  Updates made by Viona Gilmore, [place] since 04/25/2021 12:00 AM     Problem: Problem: Hypertension, Hyperlipidemia, Diabetes, COPD, and Allergic Rhinitis      Long-Range Goal: Patient-Specific Goal   Start Date: 04/25/2021  Expected End Date: 04/25/2022  This Visit's Progress: On track  Priority: High  Note:   Current Barriers:  Unable to independently afford treatment regimen Unable to independently monitor therapeutic efficacy  Pharmacist Clinical Goal(s):  Patient will verbalize ability to afford treatment regimen achieve adherence to monitoring guidelines and medication adherence to achieve therapeutic efficacy through collaboration with PharmD and provider.   Interventions: 1:1 collaboration with [name], Schultz regarding  development and update of comprehensive plan of care as evidenced by provider attestation and co-signature Inter-disciplinary care team collaboration (see longitudinal plan of care) Comprehensive medication review performed; medication list updated in electronic medical record  Hypertension (BP goal <140/90) -Not ideally controlled -Current treatment: Bisoprolol-HCTZ 10-6.25 mg 1 tablet daily -Medications previously tried: none  -Current home readings: 147/70 - forgot it once time and day (periodically - checking once a month)  -Current dietary habits:  pays attention salt intake; sometimes looks at package labels; doesn't salt her food and uses Schleicher pink salt -Current exercise habits: limited with walker -Denies hypotensive/hypertensive symptoms -Educated on BP goals and benefits of medications for prevention of heart attack, stroke and kidney damage; Importance of home blood pressure monitoring; Proper BP monitoring technique; -Counseled to monitor BP at home weekly, document, and provide log at future appointments -Counseled on diet and exercise extensively Recommended to continue current medication  Edema (Goal: minimize swelling) -Controlled -Current treatment  Furosemide 20 mg 1 tablet daily (unless she is going somewhere) -Medications previously tried: none  -Recommended to continue current medication Recommended use of compression stockings and elevating her feet.  Hyperlipidemia: (LDL goal < 70) -Controlled -Current treatment: Simvastatin 20 mg 1 tablet daily at bedtime -Medications previously tried: none  -Current dietary patterns: does not fry foods often; uses an air fryer and olive oil or coconut oil -Current exercise habits: minimal with walker -Educated on Cholesterol goals;  -Counseled on diet and exercise extensively Recommended to continue current medication  Diabetes (A1c goal <7%) -Controlled -Current medications: Metformin XR 500 mg 1 tablet three times daily - taking two times daily -Medications previously tried: none  -Current home glucose readings fasting glucose: 140s (higher over the last month) post prandial glucose: does not check often -Denies hypoglycemic/hyperglycemic symptoms -Current meal patterns:  breakfast: n/a  lunch: n/a  dinner: n/a snacks: chips consistent with last 56 days diet drinks: water and coffee and some milk -Current exercise: minimal with walker -Educated on A1c and blood sugar goals; Carbohydrate counting and/or plate method -Counseled to check feet daily and get  yearly eye exams -Counseled on diet and exercise extensively Recommended to continue current medication Counseled on benefits of GLP1 (Ozempic) for weight loss and heart event prevention. Provided website for diabetes food hub recipes.  COPD (Goal: control symptoms and prevent exacerbations) -Controlled -Current treatment  Albuterol nebulizer as needed (not using) Anoro Ellipta 62.5-25 mcg/inh 1 puff daily Albuterol HFA 2 puffs every 6 hours as needed -Medications previously tried: n/a  -Gold Grade: Gold 2 (FEV1 50-79%) -Current COPD Classification:  B (high sx, <2 exacerbations/yr) -MMRC/CAT score: 1/12 -Pulmonary function testing: 09/14/16 -Exacerbations requiring treatment in last 6 months: none -Patient reports consistent use of maintenance inhaler -Frequency of rescue inhaler use: not often (not even once a month) -Counseled on Proper inhaler technique; Differences between maintenance and rescue inhalers -Counseled on diet and exercise extensively Recommended to continue current medication Plan to apply for PAP for Anoro next year.  Allergic rhinitis/congestion (Goal: minimize symptoms) -Controlled -Current treatment  Montelukast 10 mg 1 tablet at bedtime Cetirizine 10 mg 1 tablet daily Guaifenesin 1200 mg daily at bedtime -Medications previously tried: none  -Recommended to continue current medication  Aortic atherosclerosis (Goal: prevent heart events) -Controlled -Current treatment  Aspirin 81 mg 1 tablet daily -Medications previously tried: Plavix -Recommended to continue current medication Counseled on avoidance of NSAIDs.   Health Maintenance -Vaccine gaps: influenza, COVID booster -Current therapy:  Multivitamin 1 tablet daily Triamcinolone 0.1%  apply daily Valacyclovir 500 mg 1 tablet daily Tylenol 500 mg 1 tablet as needed Calcium-magnesium-zinc-vit D 500 mg, 80 mg mag, 10 mg of zinc, 800 units of vitamin D one daily - 2 tablet (total of that) Tylenol  500 mg 1 tablet as needed   -Educated on Cost vs benefit of each product must be carefully weighed by individual consumer -Patient is satisfied with current therapy and denies issues -Recommended to continue current medication Recommended repeat DEXA.  Patient Goals/Self-Care Activities Patient will:  - take medications as prescribed check glucose daily, document, and provide at future appointments check blood pressure weekly, document, and provide at future appointments  Follow Up Plan: The care management team will reach out to the patient again over the next 30 days.         Medication Assistance:  Plan to apply for patient assistance for Anoro in 2023.  Compliance/Adherence/Medication fill history: Care Gaps: Foot exam - overdue Covid-19 vaccine booster 5 - overdue since 12/20/20  Flu vaccine - due  Last PCP BP: 140/70 P: 76 Last A1C: 6.1  Star-Rating Drugs: Met[facility] 565m - [facility] [facility] [place]  [place]hone: [phone]: [phone]hone: [phone]: [phone] pill box? Yes - weekly medication Pt endorses 95% compliance  We discussed: Current pharmacy is preferred with insurance plan and patient is satisfied with pharmacy services Patient decided to: Continue current medication management strategy  Care Plan and Follow Up Patient Decision:  Patient agrees to Care Plan and Follow-up.  Plan: The care management team will reach out to the patient again over the next 30 days.  MaJeni SallesPharmD, BCRedmondamily Medicine [phone]

## 2021-04-25 NOTE — Patient Instructions (Addendum)
Hi [place],  It was great to get to meet you over the telephone! Below is a summary of some of the topics we discussed.   Don't forget to bring your BP cuff to your appointment with Dr. Tomi Bamberger next week to make sure it works.   Here is the website for the diabetes friendly recipes that we discussed: [email or web address]   Please reach out to me if you have any questions or need anything!  Best, Maddie  Jeni Salles, PharmD, [place] [phone]   Visit Information   Goals Addressed   None    There are no care plans to display for this patient.   Ms. Twiggs was given information about Chronic Care Management services today including:  CCM service includes personalized support from designated clinical staff supervised by her physician, including individualized plan of care and coordination with other care providers 24/7 contact phone numbers for assistance for urgent and routine care needs. Standard insurance, coinsurance, copays and deductibles apply for chronic care management only during months in which we provide at least 20 minutes of these services. Most insurances cover these services at 100%, however patients may be responsible for any copay, coinsurance and/or deductible if applicable. This service may help you avoid the need for more expensive face-to-face services. Only one practitioner may furnish and bill the service in a calendar month. The patient may stop CCM services at any time (effective at the end of the month) by phone call to the office staff.  Patient agreed to services and verbal consent obtained.   Patient verbalizes understanding of instructions provided today and agrees to view in [place].  The pharmacy team will reach out to the patient again over the next 30 days.   Viona Gilmore, [facility]

## 2021-04-26 ENCOUNTER — Encounter: Payer: Self-pay | Admitting: Physician Assistant

## 2021-04-29 DIAGNOSIS — E118 Type 2 diabetes mellitus with unspecified complications: Secondary | ICD-10-CM

## 2021-04-29 DIAGNOSIS — E7800 Pure hypercholesterolemia, unspecified: Secondary | ICD-10-CM

## 2021-05-01 DIAGNOSIS — Z8551 Personal history of malignant neoplasm of bladder: Secondary | ICD-10-CM | POA: Diagnosis not present

## 2021-05-06 ENCOUNTER — Other Ambulatory Visit: Payer: Medicare HMO

## 2021-05-06 ENCOUNTER — Other Ambulatory Visit: Payer: Self-pay

## 2021-05-06 DIAGNOSIS — E118 Type 2 diabetes mellitus with unspecified complications: Secondary | ICD-10-CM

## 2021-05-06 DIAGNOSIS — Z5181 Encounter for therapeutic drug level monitoring: Secondary | ICD-10-CM

## 2021-05-07 LAB — COMPREHENSIVE METABOLIC PANEL
ALT: 28 IU/L (ref 0–32)
AST: 42 IU/L — ABNORMAL HIGH (ref 0–40)
Albumin/Globulin Ratio: 2.2 (ref 1.2–2.2)
Albumin: 4.4 g/dL (ref 3.7–4.7)
Alkaline Phosphatase: 64 IU/L (ref 44–121)
BUN/Creatinine Ratio: 19 (ref 12–28)
BUN: 13 mg/dL (ref 8–27)
Bilirubin Total: 1 mg/dL (ref 0.0–1.2)
CO2: 23 mmol/L (ref 20–29)
Calcium: 9.7 mg/dL (ref 8.7–10.3)
Chloride: 104 mmol/L (ref 96–106)
Creatinine, Ser: 0.69 mg/dL (ref 0.57–1.00)
Globulin, Total: 2 g/dL (ref 1.5–4.5)
Glucose: 158 mg/dL — ABNORMAL HIGH (ref 70–99)
Potassium: 4.5 mmol/L (ref 3.5–5.2)
Sodium: 142 mmol/L (ref 134–144)
Total Protein: 6.4 g/dL (ref 6.0–8.5)
eGFR: 91 mL/min/{1.73_m2} (ref >59)

## 2021-05-07 LAB — HEMOGLOBIN A1C
Est. average glucose Bld gHb Est-mCnc: 151 mg/dL
Hgb A1c MFr Bld: 6.9 % — ABNORMAL HIGH (ref 4.8–5.6)

## 2021-05-07 LAB — TSH: TSH: 2.17 u[IU]/mL (ref 0.450–4.500)

## 2021-05-07 NOTE — Progress Notes (Addendum)
Chief Complaint  Patient presents with   Medicare Wellness    Fasting AWV/CPE with pelvic. No new concerns. Will take flu and covid today.     Shannon Schultz is a 75 y.o. female who presents for annual physical exam, Medicare wellness visit and follow-up on chronic medical conditions.  See below for labs done prior to her visit.  She recently saw the pharmacist within our office, who made the following recommendations: -Recommend switching metformin to GLP1 (Ozempic) for weight loss and ASCVD benefits.  Patient states she doesn't want to switch. -Recommended weekly BP monitoring and bringing her cuff to her next appt (brought today, but batteries weren't good) -Consider repeat DEXA based on hip fracture  She was seen last month with acute cystitis and LE swelling felt to be a flare of her CHF.  Urinary symptoms resolved, and she has had f/u with her urologist since. Swelling improved with increased diuretics, and she saw cardiology in f/u since (see below).  She still gets some swelling in the RL.  Diabetes: Her A1c was 5.4% in 03/2020, and Metformin dose was decreased from 1500mg  daily to 1000mg  daily (split BID, tolerates it better). A1c was 6.1% in 09/2020 on this dose. Recent A1c was higher (see below). She admits she hasn't been watching her diet, "eating anything I want to", possibly a little depression contributing. She notes more grief, emotional re: the loss of her sister related to the holidays.  She reports her blood sugars at home are running 130's-140's, much higher than last time. It was 174 this morning (had 3 caramels last night). +sweets, potatoes and rice. Cut back on bread/sandwiches.  Continues to avoid all sugary beverages. Denies hypoglycemia, polydipsia and polyuria (only relatd to diuretic).  Last yearly eye exam was in 10/2020, no retinopathy found.  Patient checks feet regularly without concerns.  Denies numbness, tingling other than intermittent numbness in the 2nd and  3rd toes on the right foot (h/o fractures, didn't heal right, per pt). No skin lesions.     Hypertension:  BP's are running 120's-130/60's-70 Once it was 147/70 at night when she had forgotten her medication that day. Denies headaches, chest pain. Denies side effects of medications (on bisoprolol HCT). Compliant with medications.  H/o aortic stenosis--she underwent TAVR 07/2020.  She was prescribed ABX for SBE prophylaxis by cardiologist. She denies syncope, angina, dyspnea.  She was noted to have mild nonobstructive CAD on cardiac cath. She had increased swelling last month, and was changed to daily lasix rather than prn (after eval in office and ED to r/o DVT/PE; she did see cardiology for f/u after that who agreed with her remaining on daily lasix). She does have some cramping in feet and hands. She eats a banana daily. She is due for echo in early 2023, with f/u with Dr. Recardo Evangelist.   COPD:  She is doing well on Anoro, only rarely needs albuterol. She is under the care of Dr. Loanne Drilling.   She has nocturnal hypoxemia secondary to OSA and uses nocturnal oxygen. She had declined CPAP titration, opting to work on weight loss and continue oxygen. OSA was felt to be mild.  She denies unrefreshed sleep or daytime somnolence. She gets CT for screening for lung cancer, last was 02/2020.  She is due for repeat, and reports she thinks it is scheduled for January (I only see that it has been ordered).  She has seen Dr. Lucia Gaskins for vertigo and recurrent sinus infections (12/2019). He performed scope, saw  no evidence of sinusitis. He diagnosed chronic rhinitis and ETD, possible BPPV. He recommended regular nasal steroid use. She had reported previously that when using Flonase nightly symptoms improved--still had some popping of her ears, but not as often.  Only mild, intermittent vertigo. She had stopped using Flonase due to nosebleeds. She uses zyrtec and singulair.  She only occasionally uses Flonase, if very  stopped up. Still has some popping of her ears. She gets vertigo in certain positions, such as when she lays in bed doing hip exercises. It is short-lived (<30 seconds), not too bothersome.     Hyperlipidemia follow-up: Patient is reportedly following a low-fat, low cholesterol diet. Compliant with simvastatin and denies medication side effects.  She has known aortic atherosclerosis.  She also has known 3cm AAA.  Recommendation is for [place] f/u in 10/2022 (based on CT 10/2019). Lipids were at goal on last check: Lab Results  Component Value Date   CHOL 130 10/22/2020   HDL 54 10/22/2020   LDLCALC 59 10/22/2020   TRIG 86 10/22/2020   CHOLHDL 2.4 10/22/2020    Cirrhosis:  CT 10/2019 revealed hepatic cirrhosis and findings of portal venous hypertension. No evidence of ascites. Dr. Collene Mares is her GI. INR and albumin were normal in 07/2020, along with LFT's. See below for current labs.   H/o bladder cancer--lesion in bladder was noted incidentally on CT 11/2016 prior to hernia surgery. Pathology showed low grade papillary urothelial CA, and she was treated with intravesicular chemo.  She recently had normal follow-up cystoscopy.   She takes Valtrex preventatively for HSV flare on hip.  At one point, after she got her shingles vaccine, her flares decreased, and she changed to prn dosing.  She ultimately restarted taking it daily because she had outbreaks recur regularly.  Chronic R shoulder pain, known tear (seen on MRI 06/2017).  She sees Dr. Rhona Raider.  PT and cortisone injection helped in the past.  Subsequently had shoulder dislocation and fracture with a fall, pain flared. She has pain in the R shoulder all the time.  Can't raise it completely, and it is sore to the touch. Not currently doing HEP, still has the paperwork at home.  H/o bilateral trochanteric bursitis--last cortisone injections didn't help much. She still has some pain in both hips (R>L).  Immunization History  Administered Date(s)  Administered   Fluad Quad(high Dose 65+) 04/07/2019, 04/26/2020   Influenza Split 05/28/2011   Influenza, High Dose Seasonal PF 04/07/2013, 05/15/2014, 05/30/2015, 03/25/2016, 03/05/2017, 04/01/2018   Influenza, Seasonal, Injecte, Preservative Fre 06/07/2012   PFIZER Comirnaty(Gray Top)Covid-19 Tri-Sucrose Vaccine 10/25/2020   PFIZER(Purple Top)SARS-COV-2 Vaccination 08/20/2019, 09/13/2019, 04/14/2020   Pneumococcal Conjugate-13 07/06/2014   Pneumococcal Polysaccharide-23 12/15/2004, 05/28/2011   Tdap 02/23/2008, 06/15/2018   Zoster Recombinat (Shingrix) 12/02/2017, 02/06/2018   Zoster, Live 05/17/2010   Last Pap smear:  02/2017--normal, no high risk HPV detected Last mammogram: 11/2020 Last colonoscopy: 12/2015 with Dr. Mann--diverticulosis and internal hemorrhoids.  F/U recommend 7 years (12/2022) Last DEXA: 12/2014 normal  Dentist: twice a year Ophtho: yearly (at least) Exercise:  little to none currently. She walks 10-15 minutes/day.  Has dumbbells at home, not using them.    Patient Care Team: [name], MD as PCP - General (Family Medicine) Sanda Klein, MD as PCP - Cardiology (Cardiology) [name], MD as Referring Physician (Family Medicine) Viona Gilmore, [facility] as Pharmacist (Pharmacist) Dermatologist--Dr. Durward Fortes Dentist--Dr. Kalman Shan Ophtho--Dr. Kathlen Mody (moved) GI--Dr. Collene Mares Oncologist: Dr. Lindi Adie (no longer sees) Surgeon: Dr. Marlou Starks Urologist: Dr. Alinda Money  Pulmonary: Dr. [name] Ortho: Dr. Rhona Raider in the past, now has seen Dr. Lyla Glassing (hip). Dr. Stann Mainland (shoulder) ENT: Dr. Lucia Gaskins (retired)  Depression Screening: [place] Office Visit from 05/08/2021 in [place]  PHQ-2 Total Score 1        Falls screen:  Fall Risk  05/08/2021 10/25/2020 04/26/2020 04/07/2019 03/15/2018  Falls in the past year? 0 0 1 0 No  Number falls in past yr: 0 0 0 - -  Injury with Fall? 0 0 1 - -  Comment - - broken R hip and R shoulder - -  Risk for fall due to  : No Fall Risks No Fall Risks - - -  Follow up Falls evaluation completed Falls evaluation completed - - -     Functional Status Survey: Is the patient deaf or have difficulty hearing?: Yes Does the patient have difficulty seeing, even when wearing glasses/contacts?: Yes Does the patient have difficulty concentrating, remembering, or making decisions?: No Does the patient have difficulty walking or climbing stairs?: Yes Does the patient have difficulty dressing or bathing?: No Does the patient have difficulty doing errands alone such as visiting a doctor's office or shopping?: No  Mini-Cog Scoring: 5    End of Life Discussion:  Patient has a living will and medical power of attorney, scanned in chart.   PMH, PSH, SH and FH were reviewed and updated.   Outpatient Encounter Medications as of 05/08/2021  Medication Sig Note   ACCU-CHEK AVIVA PLUS test strip TEST BLOOD SUGAR ONE TO TWO TIMES DAILY    Accu-Chek Softclix Lancets lancets Test BS 1-2 times daily. Pt uses accu-chek meter    Alcohol Swabs (B-D SINGLE USE SWABS REGULAR) PADS Use twice a day when checking blood sugars    aspirin EC 81 MG tablet Take 81 mg by mouth daily. Swallow whole.    bisoprolol-hydrochlorothiazide (ZIAC) 10-6.25 MG tablet Take 1 tablet by mouth daily.    CALCIUM ACETATE-MAGNESIUM CARB PO Take 1 tablet by mouth daily. 04/04/2021: With zinc and vitamin D   cetirizine (ZYRTEC) 10 MG tablet Take 10 mg by mouth daily.    furosemide (LASIX) 20 MG tablet Take 1 tablet (20 mg total) by mouth daily. 05/08/2021: Does not take when she has to leave the house.   guaiFENesin (MUCINEX) 600 MG 12 hr tablet Take 1,200 mg by mouth daily.    metFORMIN (GLUCOPHAGE-XR) 500 MG 24 hr tablet TAKE 1 TABLET THREE TIMES DAILY (Patient taking differently: Take 500 mg by mouth in the morning, at noon, and at bedtime.) 04/04/2021: Taking 2 daily   montelukast (SINGULAIR) 10 MG tablet Take 1 tablet (10 mg total) by mouth at bedtime.     simvastatin (ZOCOR) 20 MG tablet Take 1 tablet (20 mg total) by mouth at bedtime.    triamcinolone cream (KENALOG) 0.1 % Apply 1 application topically as needed (rash or redness).    TRUEplus Lancets 30G MISC TEST BLOOD SUGAR ONE TO TWO TIMES DAILY    umeclidinium-vilanterol (ANORO ELLIPTA) 62.5-25 MCG/INH AEPB Inhale 1 puff into the lungs daily.    valACYclovir (VALTREX) 500 MG tablet Take 1 tablet (500 mg total) by mouth daily.    acetaminophen (TYLENOL) 500 MG tablet Take 1,000 mg by mouth every 6 (six) hours as needed for moderate pain or headache. (Patient not taking: Reported on 05/08/2021) 05/08/2021: Takes as needed    albuterol (PROVENTIL) (2.5 MG/3ML) 0.083% nebulizer solution USE 1 VIAL BY NEBULIZATION EVERY FOUR HOURS AS NEEDED  FOR WHEEZING OR SHORTNESS OF BREATH. (Patient not taking: Reported on 05/08/2021)    albuterol (VENTOLIN HFA) 108 (90 Base) MCG/ACT inhaler Inhale 2 puffs into the lungs every 6 (six) hours as needed for wheezing or shortness of breath. (Patient not taking: Reported on 05/08/2021)    No facility-administered encounter medications on file as of 05/08/2021.   Allergies  Allergen Reactions   Contrast Media [Iodinated Diagnostic Agents] Anaphylaxis, Shortness Of Breath and Other (See Comments)    Could not breath   Iohexol Anaphylaxis, Shortness Of Breath and Other (See Comments)    Immediately could not breathe   Lisinopril Anaphylaxis, Shortness Of Breath and Rash   Sulfa Antibiotics Anaphylaxis and Other (See Comments)    Historical from mother, pt states that mother says she almost died from this drug   Latex Other (See Comments)    Unless against on skin for a long time, blisters. Short term is okay.   Nickel Other (See Comments)    With earrings pt has soreness and drainage from piercing   Codeine Nausea Only, Anxiety and Other (See Comments)    insomnia   Levofloxacin Other (See Comments)    insomnia   Lipitor [Atorvastatin Calcium] Rash   Meloxicam  Rash    Broke out in a rash on her stomach,back, legs, and behind ears.   Vicodin [Hydrocodone-Acetaminophen] Itching    ROS: The patient denies anorexia, fever, decreased hearing, ear pain, sore throat, breast concerns, chest pain, palpitations, dizziness, syncope, cough, swelling, nausea, vomiting, constipation, diarrhea, abdominal pain, melena, hematochezia, indigestion/heartburn, hematuria, incontinence, dysuria, vaginal bleeding, discharge, odor or itch, genital lesions, numbness, tingling, weakness, tremor, suspicious skin lesions, depression, anxiety, abnormal bleeding/bruising, or enlarged lymph nodes. Bilateral hip pain (bursitis). Allergies are well controlled, see HPI. Ears pop some. Some hand pain from arthritis, mostly in the thumbs Some tingling in right 2nd and 3rd toes, chronic/stable. R shoulder pain, with limited ROM, tolerable. Skin itching from eczema (back, arm).  Under care of derm (couldn't tolerate hydroxyzine or benadryl, keeps her awake). Some blurred vision x 9-10 months, things look dark. Had normal eye exam in May. Slightly worse (thinks since COVID).  Vision had improved after cataract surgery, but is again back to not driving.    PHYSICAL EXAM:  BP 120/72   Pulse 76   Ht 5' 3.5" (1.613 m)   Wt 231 lb 12.8 oz (105.1 kg)   LMP  (LMP Unknown)   BMI 40.42 kg/m   Wt Readings from Last 3 Encounters:  05/08/21 231 lb 12.8 oz (105.1 kg)  04/24/21 232 lb (105.2 kg)  04/04/21 228 lb 3.2 oz (103.5 kg)    General Appearance:   Alert, cooperative, no distress, appears stated age.   Head:   Normocephalic, without obvious abnormality, atraumatic    Eyes:   PERRL, conjunctiva/corneas clear, EOM's intact, fundi benign.   Ears:   Normal TM's and external ear canals    Nose:   Not examined, wearing mask due to COVID-19 pandemic  Throat:   Not examined, wearing mask due to COVID-19 pandemic    Neck:   Supple, no lymphadenopathy; thyroid: no enlargement/  tenderness/nodules; no carotid bruit or JVD    Back:   Spine nontender, no curvature, ROM normal, no CVA tenderness    Lungs:   Clear to auscultation bilaterally without wheezes, rales or ronchi; respirations unlabored. Breath sounds are distant   Chest Wall:   No tenderness or mass. Absence of R breast, WHSS. Also WHSS L  upper chest  Heart:   Regular rate and rhythm, S1 and S2 normal, no rub or gallop.  Murmur persists, softer than prior to surgery; loudest at RUSB and radiates to carotids  Breast Exam:   No tenderness, masses, or nipple discharge or inversion of L breast. WHSS along superior aspect of areola and left upper chest. No axillary lymphadenopathy. Right breast is absent.  Small subcutaneous BB-like feeling mobile mass at 9 o'clock L breast (recorder)  Abdomen:   Soft, non-tender, nondistended, normoactive bowel sounds, no masses, no hepatosplenomegaly. + abdominal obesity. WHSS. Cannot r/o any potential ascites in and (no significant shifting dullness noted).  Genitalia:   Normal external genitalia without lesions, mild atrophic changes. BUS and vagina normal; No cervical motion tenderness. No abnormal vaginal discharge. Uterus and adnexa not enlarged, nontender, no masses, although exam is limited by body habitus. Pap not performed    Rectal:   Normal sphincter tone, no mass. Heme negative stool  Extremities:   Trace edema at L ankle, 1+ pretibial edema on R. Normal diabetic foot exam.   Pulses:   2+ and symmetric all extremities    Skin:   Skin turgor normal. Dry skin at lower back, legs, arms. Patch of eczema on L forearm. Purpura noted on R forearm. Venous stasis changes bilateral lower legs. Pink skin at R anterior shin, normal texture, some hyperpigmentation at periphery. There is prominent superficial veins in feet giving bluish discoloration, L>R. Prominent vein right shin.  Normal sensation to monofilament.  Lymph nodes:   Cervical, supraclavicular, inguinal and axillary nodes  normal    Neurologic:   Normal strength, sensation and gait; reflexes 2+ and symmetric throughout                   Psych:  Normal mood, affect, hygiene and grooming   Edema 1+ on R, trace on L  Lab Results  Component Value Date   HGBA1C 6.9 (H) 05/06/2021     Chemistry      Component Value Date/Time   NA 142 05/06/2021 0849   K 4.5 05/06/2021 0849   CL 104 05/06/2021 0849   CO2 23 05/06/2021 0849   BUN 13 05/06/2021 0849   CREATININE 0.69 05/06/2021 0849   CREATININE 0.75 03/05/2017 0736      Component Value Date/Time   CALCIUM 9.7 05/06/2021 0849   ALKPHOS 64 05/06/2021 0849   AST 42 (H) 05/06/2021 0849   ALT 28 05/06/2021 0849   BILITOT 1.0 05/06/2021 0849     Fasting glucose 158  Lab Results  Component Value Date   TSH 2.170 05/06/2021    ASSESSMENT/PLAN:  Annual physical exam  Medicare annual wellness visit, subsequent  Hypertension associated with diabetes ([place]) - controlled on current regimen  Hyperlipidemia associated with type 2 diabetes mellitus ([place]) - at goal, cont statin  Abdominal aortic aneurysm (AAA) 3.0 cm to 5.0 cm in diameter in female - recheck due 10/2022  Aortic atherosclerosis (HCC) - cont statin  Cirrhosis of liver without ascites, unspecified hepatic cirrhosis type ([place]) - cont monitor.  Will cont to observe for any ascites.  Chronic hypoxemic respiratory failure (HCC) - cont oxygen at night  OSA (obstructive sleep apnea) - declines CPAP, using oxygen at night.  Weight loss encouraged  Personal history of bladder cancer - recent normal cystoscopy  COPD, group B, by GOLD 2017 classification ([place]) - doing well on Anoro  S/P TAVR (transcatheter aortic valve replacement)  History of hip fracture - Plan: DG  Bone Density  Postmenopausal estrogen deficiency - Plan: DG Bone Density  Type 2 diabetes mellitus with complications (HCC) - Sugars worse, dietary noncompliance. Start Ozempic to help with wt loss, sugars; can decrease metformin  to 500mg  qd.  - Plan: metFORMIN (GLUCOPHAGE-XR) 500 MG 24 hr tablet  Need for influenza vaccination - Plan: Flu Vaccine QUAD High Dose(Fluad)  Need for COVID-19 vaccine - Plan: Pension scheme manager  Class 3 severe obesity due to excess calories with serious comorbidity and body mass index (BMI) of 40.0 to 44.9 in adult [facility]) - counseled re: healthy diet, portion, exercise, risks, wt loss. Starting Ozempic for DM and wt loss  Thrombocytopenia ([place]), Chronic - stable per 03/2021 labs, no active bleeding.    Start Ozempic at 0.25 mg once a week for 4 weeks, then increase to 0.5 mg dose, once a week. Stay at that for 2 months, and f/u in 3 months Decrease the metformin to 1 tablet just once daily.  Discussed monthly self breast exams and yearly mammograms; at least 30 minutes of aerobic activity at least 5 days/week and weight-bearing exercise 2x/week; proper sunscreen use reviewed; healthy diet, including goals of calcium and vitamin D intake and alcohol recommendations (less than or equal to 1 drink/day) reviewed; regular seatbelt use; changing batteries in smoke detectors.  Immunization recommendations discussed-- high dose flu shot given today.  Bivalent COVID booster given today. Pneumovax booster is recommended, to return for NV in 2 weeks. Colonoscopy recommendations reviewed, UTD--f/u with Dr. Collene Mares due 12/2022.   NV 2 weeks for pneumovax Med check 3 mos, A1c at visit.   Full Code, Full Care MOST form reviewed and updated  Total visit time >1 hr FTF, plus add'l 20 min spent in chart review, documentation, ordering of tests.  Medicare Attestation I have personally reviewed: The patient's medical and social history Their use of alcohol, tobacco or illicit drugs Their current medications and supplements The patient's functional ability including ADLs,fall risks, home safety risks, cognitive, and hearing and visual impairment Diet and physical activities Evidence for  depression or mood disorders  The patient's weight, height, BMI, and visual acuity have been recorded in the chart.  I have made referrals, counseling, and provided education to the patient based on review of the above and I have provided the patient with a written personalized care plan for preventive services.

## 2021-05-07 NOTE — Patient Instructions (Addendum)
HEALTH MAINTENANCE RECOMMENDATIONS:  It is recommended that you get at least 30 minutes of aerobic exercise at least 5 days/week (for weight loss, you may need as much as 60-90 minutes). This can be any activity that gets your heart rate up. This can be divided in 10-15 minute intervals if needed, but try and build up your endurance at least once a week.  Weight bearing exercise is also recommended twice weekly.  Eat a healthy diet with lots of vegetables, fruits and fiber.  "Colorful" foods have a lot of vitamins (ie green vegetables, tomatoes, red peppers, etc).  Limit sweet tea, regular sodas and alcoholic beverages, all of which has a lot of calories and sugar.  Up to 1 alcoholic drink daily may be beneficial for women (unless trying to lose weight, watch sugars).  Drink a lot of water.  Calcium recommendations are 1200-1500 mg daily (1500 mg for postmenopausal women or women without ovaries), and vitamin D 1000 IU daily.  This should be obtained from diet and/or supplements (vitamins), and calcium should not be taken all at once, but in divided doses.  Monthly self breast exams and yearly mammograms for women over the age of 33 is recommended.  Sunscreen of at least SPF 30 should be used on all sun-exposed parts of the skin when outside between the hours of 10 am and 4 pm (not just when at beach or pool, but even with exercise, golf, tennis, and yard work!)  Use a sunscreen that says "broad spectrum" so it covers both UVA and UVB rays, and make sure to reapply every 1-2 hours.  Remember to change the batteries in your smoke detectors when changing your clock times in the spring and fall. Carbon monoxide detectors are recommended for your home.  Use your seat belt every time you are in a car, and please drive safely and not be distracted with cell phones and texting while driving.   Shannon Schultz , Thank you for taking time to come for your Medicare Wellness Visit. I appreciate your ongoing  commitment to your health goals. Please review the following plan we discussed and let me know if I can assist you in the future.   This is a list of the screening recommended for you and due dates:  Health Maintenance  Topic Date Due   Complete foot exam   04/06/2020   COVID-19 Vaccine (5 - Booster for Pfizer series) 12/20/2020   Flu Shot  01/28/2021   Hemoglobin A1C  11/03/2021   Eye exam for diabetics  11/23/2021   Mammogram  12/12/2021   Colon Cancer Screening  01/23/2023   Tetanus Vaccine  06/15/2028   Pneumonia Vaccine  Completed   Hepatitis C Screening: USPSTF Recommendation to screen - Ages 65-79 yo.  Completed   Zoster (Shingles) Vaccine  Completed   HPV Vaccine  Aged Out   Today we did the foot exam, and gave flu shot and the bivalent COVID booster. I recommend getting a pneumovax booster (your last was 10 years ago).  This needs to be separated from the other vaccines by 2 weeks.  You can schedule a nurse visit for this in 2 weeks.  Your sugars were higher than on last check.  Rather than increasing the metformin dose again, we discussed using medication that has other benefits, including heart and also helps with weight loss.  Start Ozempic at 0.25 mg once a week for 4 weeks, then increase to 0.5 mg dose, once a week. Stay at  that for 2 months, and f/u in 3 months Decrease the metformin to 1 tablet just once daily.  [facility].  They are booking out into March now, so don't wait to call.  It is okay if you want to wait and do it in June when your next mammogram is due, if you prefer.    Please consider grief counseling.  Eat only 1/2 banana daily, and be sure to stay well hydrated.  Lung cancer screening is due. I see that the CT has been ordered, be sure that it gets scheduled, if not already done.  Let us know if the vertigo is bothersome and we can send you to physical therapy to have the treatment done.  Do your  shoulder exercises regularly.

## 2021-05-08 ENCOUNTER — Telehealth: Payer: Self-pay

## 2021-05-08 ENCOUNTER — Other Ambulatory Visit (HOSPITAL_COMMUNITY): Payer: Self-pay

## 2021-05-08 ENCOUNTER — Encounter: Payer: Self-pay | Admitting: Family Medicine

## 2021-05-08 ENCOUNTER — Other Ambulatory Visit: Payer: Self-pay

## 2021-05-08 ENCOUNTER — Ambulatory Visit (INDEPENDENT_AMBULATORY_CARE_PROVIDER_SITE_OTHER): Payer: Medicare HMO | Admitting: Family Medicine

## 2021-05-08 ENCOUNTER — Other Ambulatory Visit: Payer: Self-pay | Admitting: *Deleted

## 2021-05-08 VITALS — BP 120/72 | HR 76 | Ht 63.5 in | Wt 231.8 lb

## 2021-05-08 DIAGNOSIS — I714 Abdominal aortic aneurysm, without rupture, unspecified: Secondary | ICD-10-CM

## 2021-05-08 DIAGNOSIS — Z8781 Personal history of (healed) traumatic fracture: Secondary | ICD-10-CM

## 2021-05-08 DIAGNOSIS — E1169 Type 2 diabetes mellitus with other specified complication: Secondary | ICD-10-CM

## 2021-05-08 DIAGNOSIS — J9611 Chronic respiratory failure with hypoxia: Secondary | ICD-10-CM | POA: Diagnosis not present

## 2021-05-08 DIAGNOSIS — G4733 Obstructive sleep apnea (adult) (pediatric): Secondary | ICD-10-CM | POA: Diagnosis not present

## 2021-05-08 DIAGNOSIS — Z0000 Encounter for general adult medical examination without abnormal findings: Secondary | ICD-10-CM

## 2021-05-08 DIAGNOSIS — E118 Type 2 diabetes mellitus with unspecified complications: Secondary | ICD-10-CM

## 2021-05-08 DIAGNOSIS — Z23 Encounter for immunization: Secondary | ICD-10-CM

## 2021-05-08 DIAGNOSIS — Z8551 Personal history of malignant neoplasm of bladder: Secondary | ICD-10-CM

## 2021-05-08 DIAGNOSIS — D696 Thrombocytopenia, unspecified: Secondary | ICD-10-CM | POA: Diagnosis not present

## 2021-05-08 DIAGNOSIS — K7460 Unspecified cirrhosis of liver: Secondary | ICD-10-CM | POA: Diagnosis not present

## 2021-05-08 DIAGNOSIS — J449 Chronic obstructive pulmonary disease, unspecified: Secondary | ICD-10-CM

## 2021-05-08 DIAGNOSIS — Z952 Presence of prosthetic heart valve: Secondary | ICD-10-CM

## 2021-05-08 DIAGNOSIS — Z780 Asymptomatic menopausal state: Secondary | ICD-10-CM | POA: Diagnosis not present

## 2021-05-08 DIAGNOSIS — E1159 Type 2 diabetes mellitus with other circulatory complications: Secondary | ICD-10-CM

## 2021-05-08 DIAGNOSIS — I700 Atherosclerosis of aorta: Secondary | ICD-10-CM | POA: Diagnosis not present

## 2021-05-08 DIAGNOSIS — I152 Hypertension secondary to endocrine disorders: Secondary | ICD-10-CM

## 2021-05-08 DIAGNOSIS — Z6841 Body Mass Index (BMI) 40.0 and over, adult: Secondary | ICD-10-CM | POA: Diagnosis not present

## 2021-05-08 DIAGNOSIS — E785 Hyperlipidemia, unspecified: Secondary | ICD-10-CM

## 2021-05-08 MED ORDER — OZEMPIC (0.25 OR 0.5 MG/DOSE) 2 MG/1.5ML ~~LOC~~ SOPN
0.2500 mg | PEN_INJECTOR | SUBCUTANEOUS | 0 refills | Status: DC
  Filled 2021-05-08 (×2): qty 1.5, 42d supply, fill #0
  Filled 2021-08-07: qty 1.5, 28d supply, fill #1

## 2021-05-08 MED ORDER — METFORMIN HCL ER 500 MG PO TB24: 500.0000 mg | ORAL_TABLET | Freq: Every day | ORAL | 0 refills | Status: DC

## 2021-05-08 NOTE — Telephone Encounter (Signed)
Spoke with patient and also called pharmacy. They are only filling one pen at a time due to limited stock. $230 is for one pen and will last her 6 weeks. She is willing to pay for the one, and then since she will be out of the donut hole in Jan get the refill. This does mean she cannot start until 11/25 and will need to continue on the metformin. Is this ok?

## 2021-05-08 NOTE — Telephone Encounter (Signed)
Patient called to let [place] and Dr. Tomi Bamberger know that copay is 230 on ozempic and she will not be able to afford this until January, states she would like to talk to veronica about this and if its ok to wait to start medication

## 2021-05-08 NOTE — Telephone Encounter (Signed)
Yes, continue her current metformin until she starts the ozempic

## 2021-05-14 ENCOUNTER — Other Ambulatory Visit: Payer: Self-pay | Admitting: Family Medicine

## 2021-05-14 ENCOUNTER — Other Ambulatory Visit: Payer: Self-pay | Admitting: Pulmonary Disease

## 2021-05-14 DIAGNOSIS — I10 Essential (primary) hypertension: Secondary | ICD-10-CM

## 2021-05-17 DIAGNOSIS — I872 Venous insufficiency (chronic) (peripheral): Secondary | ICD-10-CM | POA: Diagnosis not present

## 2021-05-17 DIAGNOSIS — L2089 Other atopic dermatitis: Secondary | ICD-10-CM | POA: Diagnosis not present

## 2021-05-17 DIAGNOSIS — I8311 Varicose veins of right lower extremity with inflammation: Secondary | ICD-10-CM | POA: Diagnosis not present

## 2021-05-17 DIAGNOSIS — I8312 Varicose veins of left lower extremity with inflammation: Secondary | ICD-10-CM | POA: Diagnosis not present

## 2021-05-27 ENCOUNTER — Other Ambulatory Visit (INDEPENDENT_AMBULATORY_CARE_PROVIDER_SITE_OTHER): Payer: Medicare HMO

## 2021-05-27 ENCOUNTER — Other Ambulatory Visit: Payer: Self-pay

## 2021-05-27 DIAGNOSIS — Z23 Encounter for immunization: Secondary | ICD-10-CM | POA: Diagnosis not present

## 2021-05-30 ENCOUNTER — Telehealth: Payer: Self-pay | Admitting: Pharmacist

## 2021-05-30 NOTE — Telephone Encounter (Signed)
Called patient to follow up on Ozempic new start and inquire about patient assistance. Patient will qualify for patient assistance and will plan to apply for it.

## 2021-05-31 NOTE — Progress Notes (Signed)
Ozempic Application complete, added to spreadsheet did not call patient as she is aware. 25 min   Patient Assistance: Ozempic - Completed to be mailed to pt  [place] Pharmacist Assistant [phone]

## 2021-06-05 ENCOUNTER — Encounter: Payer: Self-pay | Admitting: Primary Care

## 2021-06-05 ENCOUNTER — Ambulatory Visit: Payer: Medicare HMO | Admitting: Primary Care

## 2021-06-05 ENCOUNTER — Telehealth: Payer: Self-pay | Admitting: Pulmonary Disease

## 2021-06-05 ENCOUNTER — Other Ambulatory Visit: Payer: Self-pay

## 2021-06-05 VITALS — BP 124/60 | HR 96 | Temp 98.5°F | Ht 64.0 in | Wt 228.2 lb

## 2021-06-05 DIAGNOSIS — J209 Acute bronchitis, unspecified: Secondary | ICD-10-CM | POA: Insufficient documentation

## 2021-06-05 DIAGNOSIS — J440 Chronic obstructive pulmonary disease with acute lower respiratory infection: Secondary | ICD-10-CM | POA: Diagnosis not present

## 2021-06-05 DIAGNOSIS — R0989 Other specified symptoms and signs involving the circulatory and respiratory systems: Secondary | ICD-10-CM

## 2021-06-05 LAB — POCT INFLUENZA A/B
Influenza A, POC: NEGATIVE
Influenza B, POC: NEGATIVE

## 2021-06-05 MED ORDER — AZITHROMYCIN 250 MG PO TABS: ORAL_TABLET | ORAL | 0 refills | Status: DC

## 2021-06-05 NOTE — Telephone Encounter (Signed)
Spoke with pt who states increased productive cough and chest congestion along with slight wheezing and SOB x 2 days. Pt does state she has chills and her normal temp is 92.2 and has been running 98.6 for 2 days. Pt states using Anoro as directed along with allergy medications. Pt stated that she had not used Albuterol HFA because she did not know when to use it. RN provided pt with PRN Albuterol HFA education, pt stated understanding. Pt has seen Geraldo Pitter in past and pt was scheduled with Benjamine Mola today at 3pm. Pt did take home Covid test yesterday which was negative. Nothing further needed at this time.   Routing to [employer]  as Juluis Rainier

## 2021-06-05 NOTE — Progress Notes (Signed)
_0  ID: [name], female    DOB: 1946/04/16, 75 y.o.   MRN: [id number]  No chief complaint on file.   Referring provider: [name], MD  Synopsis: Diagnosis of COPD well-controlled on Anoro. Last COPD exacerbation in 2019.  HPI: 75 year old female, former smoker quit 2012 (46 pack year hx). PMH significant for COPD GROUP D, chronic hypoxemic respiratory failure, sinusitis, cirrhosis of the liver, type 2 diabetes, urothelial cancer, COVID-19 May 2019. Patient of Dr. Loanne Drilling. Maintained on Anoro Ellipta.    Previous [facility] pulmonary encounter: 03/29/21- Dr. Loanne Drilling  She is well-controlled on Anoro. No steroids for respiratory issues however did complete prednisone for rash related to meloxicam. Denies shortness of breath, wheezing or cough. Has allergies with intermittent cough which she is on Singulair and over the counter. She is compliant with her oxygen at night. She randomly checks her oxygen levels and it reads 98%.   She is concerned about her insurance approving her Anoro for 2023.  Social History: 46 pack years. Quit in 2012. During 2018, she lost her sister to breast cancer in early 2018, was diagnosed with breast cancer s/p lumpectomy, had bladder cancer and underwent resection and intravesicular chemo, fell and broke her right shoulder, underwent ventral hernia repair with mesh insertion.   06/05/2021- Interim hx  Patient presents today for acute OV. She develop sinus congestion and drainage two days ago. Her symptoms have since settled in her chest. She has a new productive cough with associated wheezing and voice hoarseness. Coughing more yesterday. She is compliant with Anoro Ellipta one puff daily and she usesd nebulizer once today which helped her breathing. She is currently on prednisone for eczema. Her temperature has been running higher than her normal baseline. She is vaccinated for the flu and took a home covid test which was negative. She is planning on  having 30 people over her house this weekend.    Allergies  Allergen Reactions   Contrast Media [Iodinated Diagnostic Agents] Anaphylaxis, Shortness Of Breath and Other (See Comments)    Could not breath   Iohexol Anaphylaxis, Shortness Of Breath and Other (See Comments)    Immediately could not breathe   Lisinopril Anaphylaxis, Shortness Of Breath and Rash   Sulfa Antibiotics Anaphylaxis and Other (See Comments)    Historical from mother, pt states that mother says she almost died from this drug   Latex Other (See Comments)    Unless against on skin for a long time, blisters. Short term is okay.   Nickel Other (See Comments)    With earrings pt has soreness and drainage from piercing   Codeine Nausea Only, Anxiety and Other (See Comments)    insomnia   Levofloxacin Other (See Comments)    insomnia   Lipitor [Atorvastatin Calcium] Rash   Meloxicam Rash    Broke out in a rash on her stomach,back, legs, and behind ears.   Vicodin [Hydrocodone-Acetaminophen] Itching    Immunization History  Administered Date(s) Administered   Fluad Quad(high Dose 65+) 04/07/2019, 04/26/2020, 05/08/2021   Influenza Split 05/28/2011   Influenza, High Dose Seasonal PF 04/07/2013, 05/15/2014, 05/30/2015, 03/25/2016, 03/05/2017, 04/01/2018   Influenza, Seasonal, Injecte, Preservative Fre 06/07/2012   PFIZER Comirnaty(Gray Top)Covid-19 Tri-Sucrose Vaccine 10/25/2020   PFIZER(Purple Top)SARS-COV-2 Vaccination 08/20/2019, 09/13/2019, 04/14/2020   Pfizer Covid-19 Vaccine Bivalent Booster 72yr & up 05/08/2021   Pneumococcal Conjugate-13 07/06/2014   Pneumococcal Polysaccharide-23 12/15/2004, 05/28/2011, 05/27/2021   Tdap 02/23/2008, 06/15/2018   Zoster Recombinat (Shingrix) 12/02/2017, 02/06/2018  Zoster, Live 05/17/2010    Past Medical History:  Diagnosis Date   Arthritis    "hands" (05/15/2017)   Bladder cancer ([place]) 2018   Breast cancer, right ([place]) 1992   DCIS,bladder ca (just dx)   Colon  polyp    Complication of anesthesia 1992   "local anesthesia" used was hard to awaken from-no problems since (05/15/2017)   COPD (chronic obstructive pulmonary disease) ([place])    Coronary artery disease    COVID-19 virus infection 05/05/2019   Diverticulosis    Elevated liver enzymes    fatty liver per ultrasound per pt   FHx: BRCA2 gene positive    sister with BRCA2 mutation (pt tested NEGATIVE)   HLD (hyperlipidemia)    HSV (herpes simplex virus) infection    on hip--on daily suppression   Hypertension    Hypothyroidism    took med 7 yrs after birth of 1st child   Impaired glucose tolerance    Migraine    On home oxygen therapy    "have it available but I'm not using it" (05/15/2017)   Osteopenia    S/P TAVR (transcatheter aortic valve replacement) 08/14/2020   s/p TAVR with a 26 mm Edwards S3U via the left subclavian approach by Dr. Roxy Manns and Dr. Angelena Form   Severe aortic stenosis    Sleep apnea    Sleeps with 2 L O2 @ night   Type 2 diabetes mellitus ([place])    Ventral hernia    Vitamin D deficiency disease     Tobacco History: Social History   Tobacco Use  Smoking Status Former   Packs/day: 1.00   Years: 46.00   Pack years: 46.00   Types: Cigarettes   Quit date: 05/31/2011   Years since quitting: 10.0  Smokeless Tobacco Never   Counseling given: Not Answered   Outpatient Medications Prior to Visit  Medication Sig Dispense Refill   ACCU-CHEK AVIVA PLUS test strip TEST BLOOD SUGAR ONE TO TWO TIMES DAILY 200 strip 2   Accu-Chek Softclix Lancets lancets Test BS 1-2 times daily. Pt uses accu-chek meter 100 each 1   albuterol (PROVENTIL) (2.5 MG/3ML) 0.083% nebulizer solution USE 1 VIAL BY NEBULIZATION EVERY FOUR HOURS AS NEEDED FOR WHEEZING OR SHORTNESS OF BREATH. 30 mL 0   albuterol (VENTOLIN HFA) 108 (90 Base) MCG/ACT inhaler Inhale 2 puffs into the lungs every 6 (six) hours as needed for wheezing or shortness of breath. 8 g 5   Alcohol Swabs (B-D SINGLE USE SWABS  REGULAR) PADS Use twice a day when checking blood sugars 100 each 1   aspirin EC 81 MG tablet Take 81 mg by mouth daily. Swallow whole.     bisoprolol-hydrochlorothiazide (ZIAC) 10-6.25 MG tablet TAKE 1 TABLET EVERY DAY 90 tablet 1   CALCIUM ACETATE-MAGNESIUM CARB PO Take 1 tablet by mouth daily.     cetirizine (ZYRTEC) 10 MG tablet Take 10 mg by mouth daily.     furosemide (LASIX) 20 MG tablet Take 1 tablet (20 mg total) by mouth daily. 90 tablet 3   guaiFENesin (MUCINEX) 600 MG 12 hr tablet Take 1,200 mg by mouth daily.     metFORMIN (GLUCOPHAGE-XR) 500 MG 24 hr tablet Take 1 tablet (500 mg total) by mouth daily with breakfast. 1 tablet 0   montelukast (SINGULAIR) 10 MG tablet TAKE 1 TABLET AT BEDTIME 90 tablet 0   Semaglutide,0.25 or 0.5MG/DOS, (OZEMPIC, 0.25 OR 0.5 MG/DOSE,) 2 MG/1.5ML SOPN Inject 0.25 mg into the skin once a week for 4  weeks then increase to 0.$RemoveBefor'5mg'qaCZBoTHChNQ$  weekly for 8 weeks 6 mL 0   simvastatin (ZOCOR) 20 MG tablet Take 1 tablet (20 mg total) by mouth at bedtime. 90 tablet 1   triamcinolone cream (KENALOG) 0.1 % Apply 1 application topically as needed (rash or redness).     TRUEplus Lancets 30G MISC TEST BLOOD SUGAR ONE TO TWO TIMES DAILY 200 each 2   umeclidinium-vilanterol (ANORO ELLIPTA) 62.5-25 MCG/INH AEPB Inhale 1 puff into the lungs daily. 14 each 0   valACYclovir (VALTREX) 500 MG tablet Take 1 tablet (500 mg total) by mouth daily. 90 tablet 3   acetaminophen (TYLENOL) 500 MG tablet Take 1,000 mg by mouth every 6 (six) hours as needed for moderate pain or headache. (Patient not taking: Reported on 05/08/2021)     No facility-administered medications prior to visit.    Review of Systems  Review of Systems  Constitutional: Negative.   HENT:  Positive for congestion and postnasal drip.   Respiratory:  Positive for cough and wheezing.     Physical Exam  BP 124/60 (BP Location: Left Arm, Cuff Size: Large)   Pulse 96   Temp 98.5 F (36.9 C) (Oral)   Ht $R'5\' 4"'FI$  (1.626 m)    Wt 228 lb 3.2 oz (103.5 kg)   LMP  (LMP Unknown)   SpO2 93%   BMI 39.17 kg/m  Physical Exam Constitutional:      Appearance: Normal appearance.  HENT:     Head: Normocephalic and atraumatic.     Right Ear: Tympanic membrane normal. There is no impacted cerumen.     Left Ear: Tympanic membrane normal. There is no impacted cerumen.     Mouth/Throat:     Mouth: Mucous membranes are moist.     Pharynx: Oropharynx is clear. Posterior oropharyngeal erythema present.  Cardiovascular:     Rate and Rhythm: Normal rate.  Pulmonary:     Effort: Pulmonary effort is normal.     Breath sounds: Normal breath sounds. No wheezing, rhonchi or rales.  Musculoskeletal:        General: Normal range of motion.     Cervical back: Normal range of motion and neck supple.  Skin:    General: Skin is warm and dry.  Neurological:     General: No focal deficit present.     Mental Status: She is alert and oriented to person, place, and time. Mental status is at baseline.  Psychiatric:        Mood and Affect: Mood normal.        Behavior: Behavior normal.        Thought Content: Thought content normal.        Judgment: Judgment normal.     Lab Results:  CBC    Component Value Date/Time   WBC 3.9 (L) 04/02/2021 0832   RBC 4.43 04/02/2021 0832   HGB 13.4 04/02/2021 0832   HGB 13.0 04/01/2021 1701   HCT 42.1 04/02/2021 0832   HCT 38.4 04/01/2021 1701   PLT 100 (L) 04/02/2021 0832   PLT 95 (LL) 04/01/2021 1701   MCV 95.0 04/02/2021 0832   MCV 90 04/01/2021 1701   MCH 30.2 04/02/2021 0832   MCHC 31.8 04/02/2021 0832   RDW 15.2 04/02/2021 0832   RDW 14.2 04/01/2021 1701   LYMPHSABS 0.7 04/02/2021 0832   LYMPHSABS 1.0 04/01/2021 1701   MONOABS 0.2 04/02/2021 0832   EOSABS 0.1 04/02/2021 0832   EOSABS 0.1 04/01/2021 1701   BASOSABS 0.0 04/02/2021 7893  BASOSABS 0.1 04/01/2021 1701    BMET    Component Value Date/Time   NA 142 05/06/2021 0849   K 4.5 05/06/2021 0849   CL 104 05/06/2021  0849   CO2 23 05/06/2021 0849   GLUCOSE 158 (H) 05/06/2021 0849   GLUCOSE 179 (H) 04/02/2021 0832   BUN 13 05/06/2021 0849   CREATININE 0.69 05/06/2021 0849   CREATININE 0.75 03/05/2017 0736   CALCIUM 9.7 05/06/2021 0849   GFRNONAA >60 04/02/2021 0832   GFRAA 97 07/02/2020 1149    BNP    Component Value Date/Time   BNP 55.4 04/02/2021 0832   BNP 35.1 08/27/2015 0001    ProBNP    Component Value Date/Time   PROBNP 159.7 (H) 06/12/2011 0615    Imaging: No results found.   Assessment & Plan:   Acute bronchitis with COPD ([place]) - Patient develop head/chest congestion 2-3 days ago. Cough is productive. Associated voice hoarseness and mild intermittent wheezing. VSS. Lungs were clear on exam today. She is currently on prednisone for eczema flare. Sending in [place] for acute bronchitis symptoms. Recommend she continue mucinex 600-1210m twice daily and start delsym OTC 180mq 12 hours and ocean saline rinses prn congestion. Continue Anoro Ellipta one puff daily and prn albuterol hfa/neb q 6 hours. Influenza swab was negative in office today. Home covid PCR negative. FU if symptoms do not improve or worsen.    ElMartyn EhrichNP 06/05/2021

## 2021-06-05 NOTE — Telephone Encounter (Signed)
Pt has been sick for a few days. She is very congested and has "aall this gunk." States she's running a bit of a fever, 98.6 (that's high for her). She has a cough. Could either be a cold or a COPD flair. Woudl like something called into the pharmacy. Pharmacy is [facility] on [address].

## 2021-06-05 NOTE — Patient Instructions (Addendum)
Recommendations: Add delsym cough syrup 46ml every 12 hours as needed for cough  Add ocean saline rinses 1-2 times a day as needed for nasal congestion Continue mucinex 600-1200mg  twice daily with glass of water as needed for congestion   Rx: Zpack  Follow-up: If symptoms do not improve or worsen

## 2021-06-05 NOTE — Assessment & Plan Note (Addendum)
-   Patient develop head/chest congestion 2-3 days ago. Cough is productive. Associated voice hoarseness and mild intermittent wheezing. VSS. Lungs were clear on exam today. She is currently on prednisone for eczema flare. Sending in [place] for acute bronchitis symptoms. Recommend she continue mucinex 600-1200mg  twice daily and start delsym OTC 6ml q 12 hours and ocean saline rinses prn congestion. Continue Anoro Ellipta one puff daily and prn albuterol hfa/neb q 6 hours. Influenza swab was negative in office today. Home covid PCR negative. FU if symptoms do not improve or worsen.

## 2021-06-11 ENCOUNTER — Telehealth: Payer: Self-pay | Admitting: Pulmonary Disease

## 2021-06-11 NOTE — Telephone Encounter (Signed)
-----   Message from [place], MD sent at 06/06/2021  3:32 PM EST ----- Regarding: 3 month follow-up Please schedule patient for follow-up with me in March 2023.

## 2021-06-11 NOTE — Telephone Encounter (Signed)
Left voicemail for patient to call back to schedule follow up in March 2023.

## 2021-06-12 NOTE — Telephone Encounter (Signed)
Patient is scheduled on 09/06/2021 at 10am. Nothing further needed.

## 2021-06-17 ENCOUNTER — Ambulatory Visit (INDEPENDENT_AMBULATORY_CARE_PROVIDER_SITE_OTHER): Payer: Medicare HMO | Admitting: Family Medicine

## 2021-06-17 ENCOUNTER — Other Ambulatory Visit: Payer: Self-pay

## 2021-06-17 ENCOUNTER — Encounter: Payer: Self-pay | Admitting: Family Medicine

## 2021-06-17 VITALS — BP 120/60 | HR 80 | Temp 97.7°F | Ht 64.0 in | Wt 229.4 lb

## 2021-06-17 DIAGNOSIS — Z5181 Encounter for therapeutic drug level monitoring: Secondary | ICD-10-CM

## 2021-06-17 DIAGNOSIS — R609 Edema, unspecified: Secondary | ICD-10-CM

## 2021-06-17 DIAGNOSIS — I872 Venous insufficiency (chronic) (peripheral): Secondary | ICD-10-CM | POA: Diagnosis not present

## 2021-06-17 DIAGNOSIS — F419 Anxiety disorder, unspecified: Secondary | ICD-10-CM | POA: Diagnosis not present

## 2021-06-17 DIAGNOSIS — R600 Localized edema: Secondary | ICD-10-CM

## 2021-06-17 NOTE — Progress Notes (Signed)
Chief Complaint  Patient presents with   Leg Swelling    Swelling of the right leg that started Friday. Painful. Has been taking furosemide off and on for about a month, but daily since Friday.    She has had gradual increase in swelling of the R calf/knee, feels tight at the medial R knee.  It feels a little better today, since she kept the leg elevated all night (the tightness at the R medial knee is improved). Swelling in the leg is about the same, maybe a little less.  She noted gradual increase over a week, noticed it worse on Friday. Taking Lasix daily over the weekend (and most days prior, unless going somewhere). She denies fever, chills, or any drainage from the leg. She keeps legs elevated when she can. No long car rides or recent surgeries.  She noticed more problems after stopping the plavix in August. She wonders if she needs to be back on blood thinners.  Denies claudication. She does have some pain when she does her rehab exercises. She still has pain in R knee and R hip.  She reports tha there hands/fingers were cramping a lot. Bought pedialyte, has been drinking that.  Has compression stockings, admits to not wearing them very often, can't put them on by herself, relies on her husband, who isn't always there.  When her leg is swollen, she can't get the stocking on the right foot, it is too tight. Denies any increase in sodium intake (cut back), recent pedialyte intake. Eats out infrequently. No canned foods at home.  Last occurrence of similar RLE swelling was 03/2021. At that time she had +D-dimer with neg eval in ER--Negative pulmonary perfusion scan and doppler of LE.  Swelling resolved with increased diuretic use.  PMH, PSH, SH reviewed  Outpatient Encounter Medications as of 06/17/2021  Medication Sig Note   ACCU-CHEK AVIVA PLUS test strip TEST BLOOD SUGAR ONE TO TWO TIMES DAILY    Accu-Chek Softclix Lancets lancets Test BS 1-2 times daily. Pt uses accu-chek meter     albuterol (VENTOLIN HFA) 108 (90 Base) MCG/ACT inhaler Inhale 2 puffs into the lungs every 6 (six) hours as needed for wheezing or shortness of breath. 06/17/2021: Used last night   Alcohol Swabs (B-D SINGLE USE SWABS REGULAR) PADS Use twice a day when checking blood sugars    aspirin EC 81 MG tablet Take 325 mg by mouth 2 (two) times daily. Swallow whole. 06/17/2021: Took 4 baby aspirin yesterday, worried about her circulation   bisoprolol-hydrochlorothiazide (ZIAC) 10-6.25 MG tablet TAKE 1 TABLET EVERY DAY    CALCIUM ACETATE-MAGNESIUM CARB PO Take 1 tablet by mouth daily. 04/04/2021: With zinc and vitamin D   cetirizine (ZYRTEC) 10 MG tablet Take 10 mg by mouth 2 (two) times daily.    furosemide (LASIX) 20 MG tablet Take 1 tablet (20 mg total) by mouth daily. 05/08/2021: Does not take when she has to leave the house.   guaiFENesin (MUCINEX) 600 MG 12 hr tablet Take 1,200 mg by mouth daily.    metFORMIN (GLUCOPHAGE-XR) 500 MG 24 hr tablet Take 1 tablet (500 mg total) by mouth daily with breakfast.    montelukast (SINGULAIR) 10 MG tablet TAKE 1 TABLET AT BEDTIME    Semaglutide,0.25 or 0.5MG/DOS, (OZEMPIC, 0.25 OR 0.5 MG/DOSE,) 2 MG/1.5ML SOPN Inject 0.25 mg into the skin once a week for 4 weeks then increase to 0.68m weekly for 8 weeks 06/17/2021: .068mweekly   simvastatin (ZOCOR) 20 MG tablet Take  1 tablet (20 mg total) by mouth at bedtime.    TRUEplus Lancets 30G MISC TEST BLOOD SUGAR ONE TO TWO TIMES DAILY    umeclidinium-vilanterol (ANORO ELLIPTA) 62.5-25 MCG/INH AEPB Inhale 1 puff into the lungs daily.    valACYclovir (VALTREX) 500 MG tablet Take 1 tablet (500 mg total) by mouth daily.    acetaminophen (TYLENOL) 500 MG tablet Take 1,000 mg by mouth every 6 (six) hours as needed for moderate pain or headache. (Patient not taking: Reported on 05/08/2021) 05/08/2021: Takes as needed    albuterol (PROVENTIL) (2.5 MG/3ML) 0.083% nebulizer solution USE 1 VIAL BY NEBULIZATION EVERY FOUR HOURS AS  NEEDED FOR WHEEZING OR SHORTNESS OF BREATH. (Patient not taking: Reported on 06/17/2021)    triamcinolone cream (KENALOG) 0.1 % Apply 1 application topically as needed (rash or redness). (Patient not taking: Reported on 06/17/2021)    [DISCONTINUED] augmented betamethasone dipropionate (DIPROLENE-AF) 0.05 % cream SMARTSIG:liberally Topical Twice Daily    [DISCONTINUED] azithromycin (ZITHROMAX) 250 MG tablet Take two tablets today; then 1 tablet daily x 4 days    No facility-administered encounter medications on file as of 06/17/2021.   Allergies  Allergen Reactions   Contrast Media [Iodinated Diagnostic Agents] Anaphylaxis, Shortness Of Breath and Other (See Comments)    Could not breath   Iohexol Anaphylaxis, Shortness Of Breath and Other (See Comments)    Immediately could not breathe   Lisinopril Anaphylaxis, Shortness Of Breath and Rash   Sulfa Antibiotics Anaphylaxis and Other (See Comments)    Historical from mother, pt states that mother says she almost died from this drug   Latex Other (See Comments)    Unless against on skin for a long time, blisters. Short term is okay.   Nickel Other (See Comments)    With earrings pt has soreness and drainage from piercing   Codeine Nausea Only, Anxiety and Other (See Comments)    insomnia   Levofloxacin Other (See Comments)    insomnia   Lipitor [Atorvastatin Calcium] Rash   Meloxicam Rash    Broke out in a rash on her stomach,back, legs, and behind ears.   Vicodin [Hydrocodone-Acetaminophen] Itching   ROS:  no fever, chills, URI symptoms.  She has had slight shortness of breath.  Recently put on a z-pak by pulmonary.  Lungs were clear. Breathing is improving. She has a mild lingering cough. No chest pain. No n/v/d, claudication. No urinary frequency (even with lasix yesterday). Eczema is flaring on hands/arms. She reports having a lot of anxiety that gets worse over the holidays--started back in August when thinking about Christmas.    PHYSICAL EXAM:  BP 120/60    Pulse 80    Temp 97.7 F (36.5 C) (Tympanic)    Ht _0  (1.626 m)    Wt 229 lb 6.4 oz (104.1 kg)    LMP  (LMP Unknown)    BMI 39.38 kg/m  Wt Readings from Last 3 Encounters:  06/17/21 229 lb 6.4 oz (104.1 kg)  06/05/21 228 lb 3.2 oz (103.5 kg)  05/08/21 231 lb 12.8 oz (105.1 kg)   Pleasant female in no distress. Alert and oriented Heart: regular rate and rhythm Lungs: clear Abdomen: obese, soft Extremities:  R calf has notable swelling, (1+ pitting up to the inferior knee). +erythema, warmth, smooth, but not weepy or any open areas of skin. Tender at R medial knee, but no induration, erythema There are venous stasis changes noted on the LLE as well, but hyperpigmented, not pink, and smaller  area.   Bruising and dry skin with some excoriated areas on hands/forearms  Psych: she became tearful at end of visit--reporting anxiety, and asking for medication.  See below. Neuro: alert and oriented. Walks with walker. Grossly normal strength.  ASSESSMENT/PLAN:  Peripheral edema - do not suspect cellulitis. Increase lasix to 35m x 2-3d. Decreased Na diet reviewed, leg elevation, compression. - Plan: Ambulatory referral to Vascular Surgery  Medication monitoring encounter - Plan: Basic metabolic panel  Venous insufficiency of right lower extremity - chronic in BLE, with frequent flares in RLE. Will get vascular consult. Encouraged daily use of compression stockings for prevention, calf exercises, leg elevat - Plan: Ambulatory referral to Vascular Surgery  Anxiety - mentioned at end of visit, longstanding per pt. Discussed xanax, declined. Encouraged counseling. Reviewed techniques to help  End of visit--she mentioned getting very anxious during the holidays, very nervous. She became tearful in discussing this. She states she has had problems with anxiety since she was young.  She never was treated with anything.  At first she was asking for a medication to help  her get through the holidays. I discussed alprazolam, risks/SE, how/when to take, versus more preventative meds for long-standing anxiety, but take longer to have effect. She then changed her mind, did NOT want any medication. She felt there was "underlying issues", and feels that perhaps counseling would be better for her.  We discussed some relaxation techniques, given information on mindfulness. If she changes her mind, she can let [facility].  Checking b-met today given her complaints of severe hand cramps.  Advised not to drink pedialyte (don't want her to have the sodium, since she isn't having electrolyte losses from vomiting or diarrhea). If K+ is low, may need Rx replacement.  I spent 52 minutes dedicated to the care of this patient, including pre-visit review of records, face to face time, post-visit ordering of testing and documentation.

## 2021-06-17 NOTE — Patient Instructions (Signed)
Double up on the lasix (furosemide) for the next 2-3 days--either taking 2 tablets together, or one twice daily).  Stop the pedialyte which also has sodium. We are checking your electrolytes today. Please drink plenty of water, and get potassium through your diet (banana).  We are checking your potassium levels.  Keep your legs elevated above the level of your heart when you can. Use the wrap to help give some compression until your swelling is down enough such that you can wear the compression stocking. Continue to limit the sodium in your diet, and wearing the compression stocking on the left (and ideally o both legs, when you can).  I do not think that your circulation issue is related to arterial blood flow. I don't think you need blood thinners.  I believe it is related to venous insufficiency.  Compression is the answer (for prevention). We will refer you for a vascular consult since you keep having painful recurrences, to see what else may be recommended for treatment.  Bilateral compression stockings, regular calf exercises and limiting sodium in the diet are key.

## 2021-06-18 LAB — BASIC METABOLIC PANEL
BUN/Creatinine Ratio: 20 (ref 12–28)
BUN: 13 mg/dL (ref 8–27)
CO2: 28 mmol/L (ref 20–29)
Calcium: 9.6 mg/dL (ref 8.7–10.3)
Chloride: 106 mmol/L (ref 96–106)
Creatinine, Ser: 0.64 mg/dL (ref 0.57–1.00)
Glucose: 155 mg/dL — ABNORMAL HIGH (ref 70–99)
Potassium: 4.7 mmol/L (ref 3.5–5.2)
Sodium: 149 mmol/L — ABNORMAL HIGH (ref 134–144)
eGFR: 92 mL/min/{1.73_m2} (ref >59)

## 2021-06-20 ENCOUNTER — Encounter: Payer: Self-pay | Admitting: Family Medicine

## 2021-06-25 ENCOUNTER — Other Ambulatory Visit: Payer: Self-pay | Admitting: Family Medicine

## 2021-06-25 DIAGNOSIS — E7800 Pure hypercholesterolemia, unspecified: Secondary | ICD-10-CM

## 2021-06-25 NOTE — Progress Notes (Signed)
06/25/21 Unable to respond to patients chart message  confirming receipt of her PAP for Ozempic, Call to patient to advise her that I did see it and gave her the correct number to reach me if she has any further questions or needs.    [facility] Clinical Pharmacist Assistant [phone]

## 2021-06-27 ENCOUNTER — Encounter: Payer: Self-pay | Admitting: Family Medicine

## 2021-06-27 ENCOUNTER — Ambulatory Visit (INDEPENDENT_AMBULATORY_CARE_PROVIDER_SITE_OTHER): Payer: Medicare HMO | Admitting: Family Medicine

## 2021-06-27 ENCOUNTER — Other Ambulatory Visit: Payer: Self-pay

## 2021-06-27 VITALS — BP 132/70 | HR 92 | Wt 229.0 lb

## 2021-06-27 DIAGNOSIS — L309 Dermatitis, unspecified: Secondary | ICD-10-CM

## 2021-06-27 DIAGNOSIS — R609 Edema, unspecified: Secondary | ICD-10-CM | POA: Diagnosis not present

## 2021-06-27 DIAGNOSIS — I872 Venous insufficiency (chronic) (peripheral): Secondary | ICD-10-CM

## 2021-06-27 DIAGNOSIS — R600 Localized edema: Secondary | ICD-10-CM

## 2021-06-27 MED ORDER — PREDNISONE 20 MG PO TABS: 20.0000 mg | ORAL_TABLET | Freq: Two times a day (BID) | ORAL | 0 refills | Status: DC

## 2021-06-27 NOTE — Patient Instructions (Signed)
Continue to take furosemide 20mg  once daily. Continue to use compression on the right leg. Swelling appears to be better--ideally if you can use the compression stocking, that would be best.   Be sure to put the stocking or compression wrap first thing upon wakening, before there is any swelling. If you are able to elevated the leg above the heart at night, when not wrapped, that would be ideal.  There doesn't appear to be infection at this point.  If it continues to weep, and have open areas of the skin, that is an area that introduces the risk for infection. Increasing pain, redness, crusting, fever are indications of infection. If/when you run out of medicated gauze, you can use antibacterial ointment (bacitracin) to the open areas of skin.  We are prescribing a short course of prednisone to help with the itching and eczema flare. Expect your sugars to go up. We are referring you to another dermatologist who hopefully can help. Continue to moisturize at least 3 times daily, and avoid prolonged hot water.

## 2021-06-27 NOTE — Progress Notes (Signed)
Chief Complaint  Patient presents with   Edema    RLE currently wrapped with Ardelia Mems boot put on today weeping in calf region   She hadn't wrapped her leg yet this morning (because she was planning to shower), when she noticed it was leaking from the back of the leg.  She had been wrapping it regularly.  Didn't notice much difference in the swelling. She took 2 fluid pills daily 12/19-12/24. Didn't take any on Christmas due to long drive. Didn't notice worsening of swelling. Started back to lasix 1/d on 12/26.  When they noticed the draining from the leg, her husband wrapped her leg with impregnated gauze she had used with Unna wrap in the past. Put compression wrap on top.  She is complaining of significant flare of her eczema (almost mentioned at her last visit)--hands, arms, back, "everywhere". She has been using an anti-itch cream (Cerevie) Hasn't tried TAC, which she previously was prescribed for stasis dermatitis. Doesn't want to go back to Dr. Elvera Lennox, felt he was rude. Asking for new dermatologist.   PMH, [place], [place] reviewed  Outpatient Encounter Medications as of 06/27/2021  Medication Sig Note   albuterol (VENTOLIN HFA) 108 (90 Base) MCG/ACT inhaler Inhale 2 puffs into the lungs every 6 (six) hours as needed for wheezing or shortness of breath. 06/17/2021: Used last night   aspirin EC 81 MG tablet Take 325 mg by mouth 2 (two) times daily. Swallow whole. 06/27/2021: Takes 81 mg    bisoprolol-hydrochlorothiazide (ZIAC) 10-6.25 MG tablet TAKE 1 TABLET EVERY DAY    CALCIUM ACETATE-MAGNESIUM CARB PO Take 1 tablet by mouth daily. 04/04/2021: With zinc and vitamin D   cetirizine (ZYRTEC) 10 MG tablet Take 10 mg by mouth 2 (two) times daily.    furosemide (LASIX) 20 MG tablet Take 1 tablet (20 mg total) by mouth daily. 05/08/2021: Does not take when she has to leave the house.   guaiFENesin (MUCINEX) 600 MG 12 hr tablet Take 1,200 mg by mouth daily.    metFORMIN (GLUCOPHAGE-XR) 500 MG 24 hr  tablet Take 1 tablet (500 mg total) by mouth daily with breakfast.    montelukast (SINGULAIR) 10 MG tablet TAKE 1 TABLET AT BEDTIME    predniSONE (DELTASONE) 20 MG tablet Take 1 tablet (20 mg total) by mouth 2 (two) times daily with a meal.    Semaglutide,0.25 or 0.5MG /DOS, (OZEMPIC, 0.25 OR 0.5 MG/DOSE,) 2 MG/1.5ML SOPN Inject 0.25 mg into the skin once a week for 4 weeks then increase to 0.5mg  weekly for 8 weeks 06/17/2021: .05mg  weekly   simvastatin (ZOCOR) 20 MG tablet TAKE 1 TABLET AT BEDTIME    triamcinolone cream (KENALOG) 0.1 % Apply 1 application topically as needed (rash or redness).    valACYclovir (VALTREX) 500 MG tablet Take 1 tablet (500 mg total) by mouth daily.    ACCU-CHEK AVIVA PLUS test strip TEST BLOOD SUGAR ONE TO TWO TIMES DAILY    Accu-Chek Softclix Lancets lancets Test BS 1-2 times daily. Pt uses accu-chek meter    acetaminophen (TYLENOL) 500 MG tablet Take 1,000 mg by mouth every 6 (six) hours as needed for moderate pain or headache. (Patient not taking: Reported on 05/08/2021) 05/08/2021: Takes as needed    albuterol (PROVENTIL) (2.5 MG/3ML) 0.083% nebulizer solution USE 1 VIAL BY NEBULIZATION EVERY FOUR HOURS AS NEEDED FOR WHEEZING OR SHORTNESS OF BREATH. (Patient not taking: Reported on 06/17/2021)    Alcohol Swabs (B-D SINGLE USE SWABS REGULAR) PADS Use twice a day when checking blood  sugars    TRUEplus Lancets 30G MISC TEST BLOOD SUGAR ONE TO TWO TIMES DAILY    umeclidinium-vilanterol (ANORO ELLIPTA) 62.5-25 MCG/INH AEPB Inhale 1 puff into the lungs daily.    No facility-administered encounter medications on file as of 06/27/2021.   Allergies  Allergen Reactions   Contrast Media [Iodinated Contrast Media] Anaphylaxis, Shortness Of Breath and Other (See Comments)    Could not breath   Iohexol Anaphylaxis, Shortness Of Breath and Other (See Comments)    Immediately could not breathe   Lisinopril Anaphylaxis, Shortness Of Breath and Rash   Sulfa Antibiotics  Anaphylaxis and Other (See Comments)    Historical from mother, pt states that mother says she almost died from this drug   Latex Other (See Comments)    Unless against on skin for a long time, blisters. Short term is okay.   Nickel Other (See Comments)    With earrings pt has soreness and drainage from piercing   Codeine Nausea Only, Anxiety and Other (See Comments)    insomnia   Levofloxacin Other (See Comments)    insomnia   Lipitor [Atorvastatin Calcium] Rash   Meloxicam Rash    Broke out in a rash on her stomach,back, legs, and behind ears.   Vicodin [Hydrocodone-Acetaminophen] Itching   ROS: No f/c/n/v/d No chest pain or SOB Using albuterol at night, residual from cold. Eczema flare per HPI. RLE edema, with drainage starting today.   PHYSICAL EXAM:  BP 132/70 (BP Location: Left Arm, Patient Position: Sitting)    Pulse 92    Wt 229 lb (103.9 kg)    LMP  (LMP Unknown)    SpO2 95%    BMI 39.31 kg/m   Wt Readings from Last 3 Encounters:  06/27/21 229 lb (103.9 kg)  06/17/21 229 lb 6.4 oz (104.1 kg)  06/05/21 228 lb 3.2 oz (103.5 kg)   Well-appearing female, in good spirits.  She is accompanied by her husband today. RLE--wrapped with medicated gauze and overlying wrap.  This was removed. No pitting edema was present.  There was hyperpigmentation/discoloration noted, unchanged Cleansed the white medication off and noted very mild erythema posteriorly.   Skin appeared in intact, no weeping or drainage noted. Minimally tender.  Skin; Back--very dry, hyperpigmented across most of the back. Lower back--papular area seems to be healing, less inflamed (but hyperpigmentation in the lower back isn't uniform, more spotted). She has patches of dry, erythematous skin on the back of the R hand, L elbow, neck, and her back.  ASSESSMENT/PLAN:  Peripheral edema - improved (wrapping/compression is helping). Cont lasix 20mg  daily. Try to get back to compression stocking. bacitracin to  any open areas. no cellulitis  Venous insufficiency of right lower extremity - cont low Na diet, compression, elevation. She has visit scheduled with vascular  - Plan: Ambulatory referral to Dermatology  Eczema, unspecified type - flaring diffusely.  Short course of steroids, risks/SE reviewed, including high blood sugars. - Plan: Ambulatory referral to Dermatology, predniSONE (DELTASONE) 20 MG tablet  Continue to take furosemide 20mg  once daily. Continue to use compression on the right leg. Swelling appears to be better--ideally if you can use the compression stocking, that would be best.   Be sure to put the stocking or compression wrap first thing upon wakening, before there is any swelling. If you are able to elevated the leg above the heart at night, when not wrapped, that would be ideal.  There doesn't appear to be infection at this point.  If  it continues to weep, and have open areas of the skin, that is an area that introduces the risk for infection. Increasing pain, redness, crusting, fever are indications of infection. If/when you run out of medicated gauze, you can use antibacterial ointment (bacitracin) to the open areas of skin.  We are prescribing a short course of prednisone to help with the itching and eczema flare. Expect your sugars to go up. We are referring you to another dermatologist who hopefully can help. Continue to moisturize at least 3 times daily, and avoid prolonged hot water.  I spent 39 minutes dedicated to the care of this patient, including pre-visit review of records, face to face time, post-visit ordering of testing and documentation.

## 2021-07-04 DIAGNOSIS — I872 Venous insufficiency (chronic) (peripheral): Secondary | ICD-10-CM | POA: Diagnosis not present

## 2021-07-04 DIAGNOSIS — L853 Xerosis cutis: Secondary | ICD-10-CM | POA: Diagnosis not present

## 2021-07-04 DIAGNOSIS — L298 Other pruritus: Secondary | ICD-10-CM | POA: Diagnosis not present

## 2021-07-05 DIAGNOSIS — H353131 Nonexudative age-related macular degeneration, bilateral, early dry stage: Secondary | ICD-10-CM | POA: Diagnosis not present

## 2021-07-05 DIAGNOSIS — H26493 Other secondary cataract, bilateral: Secondary | ICD-10-CM | POA: Diagnosis not present

## 2021-07-05 DIAGNOSIS — E119 Type 2 diabetes mellitus without complications: Secondary | ICD-10-CM | POA: Diagnosis not present

## 2021-07-05 DIAGNOSIS — H04123 Dry eye syndrome of bilateral lacrimal glands: Secondary | ICD-10-CM | POA: Diagnosis not present

## 2021-07-05 LAB — HM DIABETES EYE EXAM

## 2021-07-10 ENCOUNTER — Telehealth: Payer: Self-pay | Admitting: Pulmonary Disease

## 2021-07-10 MED ORDER — ALBUTEROL SULFATE HFA 108 (90 BASE) MCG/ACT IN AERS: 2.0000 | INHALATION_SPRAY | Freq: Four times a day (QID) | RESPIRATORY_TRACT | 3 refills | Status: AC | PRN

## 2021-07-10 NOTE — Telephone Encounter (Signed)
Called pt to verify if she wanted her rescue inhaler to be sent to mail order pharmacy or if she wanted it to go to her local pharmacy and she said to go ahead and send Rx to mail order pharmacy. Rx has been sent to pharmacy for pt. Nothing further needed.

## 2021-07-11 ENCOUNTER — Ambulatory Visit (INDEPENDENT_AMBULATORY_CARE_PROVIDER_SITE_OTHER): Payer: Medicare HMO | Admitting: Pharmacist

## 2021-07-11 DIAGNOSIS — E1159 Type 2 diabetes mellitus with other circulatory complications: Secondary | ICD-10-CM

## 2021-07-11 DIAGNOSIS — E118 Type 2 diabetes mellitus with unspecified complications: Secondary | ICD-10-CM

## 2021-07-11 NOTE — Progress Notes (Signed)
Chronic Care Management Pharmacy Note  07/11/2021 Name:  Shannon Schultz MRN:  [id number] DOB:  1945-08-29  Summary: A1c at goal < 7% but recent home readings have been higher with steroid injections/prescriptions BP at goal < 140/90  Recommendations/Changes made from today's visit: -Recommend alternating times of day for checking blood sugars -Recommended weekly BP monitoring and checking on the day she gives the Ozempic injection -Consider repeat DEXA based on hip fracture  Plan: Follow up after PCP visit in Feb Apply for PAP for Anoro for 2023   Subjective: Shannon Schultz is an 76 y.o. year old female who is a primary patient of [name], MD.  The CCM team was consulted for assistance with disease management and care coordination needs.    Engaged with patient by telephone for initial visit in response to provider referral for pharmacy case management and/or care coordination services.   Consent to Services:  The patient was given the following information about Chronic Care Management services today, agreed to services, and gave verbal consent: 1. CCM service includes personalized support from designated clinical staff supervised by the primary care provider, including individualized plan of care and coordination with other care providers 2. 24/7 contact phone numbers for assistance for urgent and routine care needs. 3. Service will only be billed when office clinical staff spend 20 minutes or more in a month to coordinate care. 4. Only one practitioner may furnish and bill the service in a calendar month. 5.The patient may stop CCM services at any time (effective at the end of the month) by phone call to the office staff. 6. The patient will be responsible for cost sharing (co-pay) of up to 20% of the service fee (after annual deductible is met). Patient agreed to services and consent obtained.  Patient Care Team: [name], MD as PCP - General (Family Medicine) Sanda Klein,  MD as PCP - Cardiology (Cardiology) [name], MD as Referring Physician (Family Medicine) Viona Gilmore, [facility] as Pharmacist (Pharmacist)  Recent office visits: 06/27/21 [name], MD: Patient presented for edema follow up. Prescribed prednisone 20 mg BID.  06/17/21 [name], MD: Patient presented for peripheral edema. Recommended doubling up on Furosemide x 2 days. Recommended stopping Pedialyte.  05/08/21 [name], MD: Patient presented for annual exam. Prescribed Ozempic 0.25 mg x 4 weeks and then 0.5 mg weekly.  04/04/21 [name] MD (PCP) - seen for acute cystitis with hematuria and other chronic conditions. Decrease furosemide to just 1 tablet daily. Increase back if needed. Follow up with Dr. Sallyanne Kuster.    04/01/21 Vickie Henson PA-C (Family Medicine) - seen for acute cystitis with hematuria and other issues. Patient started on doxycycline 14m 2 times daily. Follow up as needed.    01/29/21 JJill AlexandersMD (Family Medicine) - seen for acute cystitis with hematuria and other issues. Patient started on doxycycline 1047m2 times daily. Follow up in 2 weeks.    Recent consult visits: 06/05/21 ElGeraldo PitterNP (pulmonology): Patient presented for chest congestion. Prescribed Zpak and continue with mucinex BID.  03/29/21 Chi JaRodman PickleD (Pulmonology) - seen for COPD group B. Changed umeclidinium- Vilanterol 62.5- 2550m1 puff daily. Discontinued amoxicillin, aspirin and doxycycline. Follow up in 3 months.    02/18/21 LarCherlynn Juneseen for displaced intertrochanteric fracture of right femur, subsequent encounter for closed fracture with routine healing and other issues. No medication changes or follow up noted.    01/10/21 Mihai Croitoru MD (Cardiology) - seen  for nonrheumatic aortic valve stenosis and other issues. Discontinued aspirin-caffeine 845-24m 1 packet oral daily. Follow up for 12 months.    11/23/20 CMarshall Cork(Ophthalmology) - seen for presence of intraocular  lens and other issues. No medication changes. No follow up noted.   Hospital visits: Medication Reconciliation was completed by comparing discharge summary, patients EMR and Pharmacy list, and upon discussion with patient.   [facility] due to acute diastolic congestive heart failure.    [facility] -[facility] None.   Medication Changes at Hospital Discharge: -Changed None.   Medications Discontinued at Hospital Discharge: -Stopped None.    Medications that remain the same after Hospital Discharge:??  -All other medications will remain the same.     Objective:  Lab Results  Component Value Date   CREATININE 0.64 06/17/2021   BUN 13 06/17/2021   GFRNONAA >60 04/02/2021   GFRAA 97 07/02/2020   NA 149 (H) 06/17/2021   K 4.7 06/17/2021   CALCIUM 9.6 06/17/2021   CO2 28 06/17/2021   GLUCOSE 155 (H) 06/17/2021    Lab Results  Component Value Date/Time   HGBA1C 6.9 (H) 05/06/2021 08:49 AM   HGBA1C 6.1 (H) 10/22/2020 08:40 AM   MICROALBUR 2.6 08/25/2016 12:14 PM   MICROALBUR 0.3 07/19/2015 12:01 AM    Last diabetic Eye exam:  Lab Results  Component Value Date/Time   HMDIABEYEEXA No Retinopathy 11/23/2020 12:00 AM    Last diabetic Foot exam: No results found for: HMDIABFOOTEX   Lab Results  Component Value Date   CHOL 130 10/22/2020   HDL 54 10/22/2020   LDLCALC 59 10/22/2020   TRIG 86 10/22/2020   CHOLHDL 2.4 10/22/2020    Hepatic Function Latest Ref Rng & Units 05/06/2021 04/02/2021 10/22/2020  Total Protein 6.0 - 8.5 g/dL 6.4 6.8 6.4  Albumin 3.7 - 4.7 g/dL 4.4 3.6 4.3  AST 0 - 40 IU/L 42(H) 37 29  ALT 0 - 32 IU/L 28 51(H) 23  Alk Phosphatase 44 - 121 IU/L 64 60 72  Total Bilirubin 0.0 - 1.2 mg/dL 1.0 0.8 0.5  Bilirubin, Direct 0.0 - 0.3 mg/dL - - -    Lab Results  Component Value Date/Time   TSH 2.170 05/06/2021 08:49 AM   TSH 3.150 04/23/2020 08:49 AM     CBC Latest Ref Rng & Units 04/02/2021 04/01/2021 08/15/2020  WBC 4.0 - 10.5 K/uL 3.9(L) 5.1 6.8  Hemoglobin 12.0 - 15.0 g/dL 13.4 13.0 11.3(L)  Hematocrit 36.0 - 46.0 % 42.1 38.4 35.4(L)  Platelets 150 - 400 K/uL 100(L) 95(LL) 117(L)    Lab Results  Component Value Date/Time   VD25OH 34.27 08/24/2019 01:56 AM   VD25OH 69 05/16/2011 08:44 AM    Clinical ASCVD: Yes  The 10-year ASCVD risk score (Arnett DK, et al., 2019) is: 37.3%   Values used to calculate the score:     Age: [age]years     Sex: Female     Is Non-Hispanic African American: No     Diabetic: Yes     Tobacco smoker: No     Systolic Blood Pressure: 1497mmHg     Is BP treated: Yes     HDL Cholesterol: 54 mg/dL     Total Cholesterol: 130 mg/dL    [facility] 05/08/2021 10/25/2020 04/26/2020  Decreased Interest 0 1 0  Down, Depressed, Hopeless 1 1 0  PHQ - 2 Score 1 2  0  Some recent data might be hidden     Social History   Tobacco Use  Smoking Status Former   Packs/day: 1.00   Years: 46.00   Pack years: 46.00   Types: Cigarettes   Quit date: 05/31/2011   Years since quitting: 10.1  Smokeless Tobacco Never   BP Readings from Last 3 Encounters:  06/27/21 132/70  06/17/21 120/60  06/05/21 124/60   Pulse Readings from Last 3 Encounters:  06/27/21 92  06/17/21 80  06/05/21 96   Wt Readings from Last 3 Encounters:  06/27/21 229 lb (103.9 kg)  06/17/21 229 lb 6.4 oz (104.1 kg)  06/05/21 228 lb 3.2 oz (103.5 kg)   BMI Readings from Last 3 Encounters:  06/27/21 39.31 kg/m  06/17/21 39.38 kg/m  06/05/21 39.17 kg/m    Assessment/Interventions: Review of patient past medical history, allergies, medications, health status, including review of consultants reports, laboratory and other test data, was performed as part of comprehensive evaluation and provision of chronic care management services.   SDOH:  (Social Determinants of Health) assessments and interventions performed: Yes   SDOH  Screenings   Alcohol Screen: Not on file  Depression (PHQ2-9): Low Risk    PHQ-2 Score: 1  Financial Resource Strain: Low Risk    Difficulty of Paying Living Expenses: Not very hard  Food Insecurity: Not on file  Housing: Not on file  Physical Activity: Not on file  Social Connections: Not on file  Stress: Not on file  Tobacco Use: Medium Risk   Smoking Tobacco Use: Former   Smokeless Tobacco Use: Never   Passive Exposure: Not on file  Transportation Needs: No Transportation Needs   Lack of Transportation (Medical): No   Lack of Transportation (Non-Medical): No   Patient gets up, cooks breakfast and does her own laundry, and then cleans up around the house. She is a morning person. Patient is involved in circle at church and used to read a lot but hasn't found many good books at [employer]. She used to read on her iPad too but it is now outdated.  Patient prepares the meals at home as her husband doesn't like to go out to eat. She usually cooks meat and 2 vegetables. She mostly eats chicken and beef and recently just started buying frozen meals as requested by her husband. She doesn't eat many desserts and drinks milk, sometimes, and mostly coffee and water.   Patient is doing hip rehab exercises almost every day. She fell a year ago and broke her hip and did PT afterwards and is still doing those exercises. She is also walking with a walker and feels like her leg may give way. She does not feel comfortable without the walker.  Patient sleeps in recliner with oxygen. She reports it hurts to sleep in bed and is a side sleeper. She has bursitis in both hips and doesn't waking up during the night. She also doesn't usually nap during the day.  Patient denies problems with medications other than cost. She is having a hard time affording Anoro when in the donut hole but she has enough to last until the end of the year at this point. She did qualify for PAP with [facility] in the past but forgot to  apply this year.  Patient is hoping to lose more weight again.  CCM Care Plan  Allergies  Allergen Reactions   Contrast Media [Iodinated Contrast Media] Anaphylaxis, Shortness Of Breath and Other (See Comments)  Could not breath   Iohexol Anaphylaxis, Shortness Of Breath and Other (See Comments)    Immediately could not breathe   Lisinopril Anaphylaxis, Shortness Of Breath and Rash   Sulfa Antibiotics Anaphylaxis and Other (See Comments)    Historical from mother, pt states that mother says she almost died from this drug   Latex Other (See Comments)    Unless against on skin for a long time, blisters. Short term is okay.   Nickel Other (See Comments)    With earrings pt has soreness and drainage from piercing   Codeine Nausea Only, Anxiety and Other (See Comments)    insomnia   Levofloxacin Other (See Comments)    insomnia   Lipitor [Atorvastatin Calcium] Rash   Meloxicam Rash    Broke out in a rash on her stomach,back, legs, and behind ears.   Vicodin [Hydrocodone-Acetaminophen] Itching    Medications Reviewed Today     Reviewed by Viona Gilmore, [facility] (Pharmacist) on 07/11/21 at [place] List Status: <None>   Medication Order Taking? Sig Documenting Provider Last Dose Status Informant  ACCU-CHEK AVIVA PLUS test strip [id number] No TEST BLOOD SUGAR ONE TO TWO TIMES DAILY [name], MD Taking Active Self  Accu-Chek Softclix Lancets lancets [id number] No Test BS 1-2 times daily. Pt uses accu-chek meter [name], MD Taking Active   acetaminophen (TYLENOL) 500 MG tablet [id number] No Take 1,000 mg by mouth every 6 (six) hours as needed for moderate pain or headache.  Patient not taking: Reported on 05/08/2021   [provider] Not Taking Active            Med Note Orest Dikes May 08, 2021 10:04 AM) Dewaine Conger as needed   albuterol (PROVENTIL) (2.5 MG/3ML) 0.083% nebulizer solution [id number] No USE 1 VIAL BY NEBULIZATION EVERY FOUR HOURS AS NEEDED FOR  WHEEZING OR SHORTNESS OF BREATH.  Patient not taking: Reported on 06/17/2021   Carlena Hurl, PA-C Not Taking Active   albuterol (VENTOLIN HFA) 108 (90 Base) MCG/ACT inhaler [id number]  Inhale 2 puffs into the lungs every 6 (six) hours as needed for wheezing or shortness of breath. [name], MD  Active   Alcohol Swabs (B-D SINGLE USE SWABS REGULAR) PADS [id number] No Use twice a day when checking blood sugars [name], MD Taking Active Self  aspirin EC 81 MG tablet [id number] No Take 325 mg by mouth 2 (two) times daily. Swallow whole. [provider] Taking Active            Med Note Philipp Deputy Jun 27, 2021  3:37 PM) Takes 81 mg   bisoprolol-hydrochlorothiazide [facility]) 10-6.25 MG tablet [id number] No TAKE 1 TABLET EVERY Jana Hakim, MD Taking Active   CALCIUM ACETATE-MAGNESIUM CARB PO [id number] No Take 1 tablet by mouth daily. [provider] Taking Active Self           Med Note Tamala Julian, Verlin Dike Apr 04, 2021  1:28 PM) With zinc and vitamin D  cetirizine (ZYRTEC) 10 MG tablet [id number] No Take 10 mg by mouth 2 (two) times daily. [provider] Taking Active Self  furosemide (LASIX) 20 MG tablet [id number] No Take 1 tablet (20 mg total) by mouth daily. Almyra Deforest, [place] Taking Active            Med Note Tamala Julian, Chauncey Cruel May 08, 2021 10:05 AM) Does not take when she has to  leave the house.  guaiFENesin (MUCINEX) 600 MG 12 hr tablet [id number] No Take 1,200 mg by mouth daily. [provider] Taking Active Self  metFORMIN (GLUCOPHAGE-XR) 500 MG 24 hr tablet [id number] No Take 1 tablet (500 mg total) by mouth daily with breakfast. [name], MD Taking Active   montelukast (SINGULAIR) 10 MG tablet [id number] No TAKE 1 TABLET AT BEDTIME [name], MD Taking Active   predniSONE (DELTASONE) 20 MG tablet [id number]  Take 1 tablet (20 mg total) by mouth 2 (two) times daily with a meal. [name], MD  Active   Semaglutide,0.25 or  0.5MG/DOS, (OZEMPIC, 0.25 OR 0.5 MG/DOSE,) 2 MG/1.5ML SOPN [id number] No Inject 0.25 mg into the skin once a week for 4 weeks then increase to 0.77m weekly for 8 weeks KRita Ohara MD Taking Active            Med Note (Georgiana SpinnerDec 19, 2022 11:19 AM) .054mweekly  simvastatin (ZOCOR) 20 MG tablet [id number] TAKE 1 TABLET AT BEDTIME KnRita OharaMD Taking Active   triamcinolone cream (KENALOG) 0.1 % [id number] Apply 1 application topically as needed (rash or redness). [provider] Taking Active Self  TRUEplus Lancets 30G MISC [id number] TEST BLOOD SUGAR ONE TO TWO TIMES DAILY KnRita OharaMD Taking Active Self  umeclidinium-vilanterol (ANORO ELLIPTA) 62.5-25 MCG/INH AEPB [id number] Inhale 1 puff into the lungs daily. ElMargaretha SeedsMD Taking Active Self  valACYclovir (VALTREX) 500 MG tablet [id number] Take 1 tablet (500 mg total) by mouth daily. KnRita OharaMD Taking Active Self            Patient Active Problem List   Diagnosis Date Noted   Acute bronchitis with COPD (HCAbilene12/12/2020   Nocturnal hypoxemia 11/12/2020   S/P TAVR (transcatheter aortic valve replacement) 08/14/2020   Severe aortic stenosis    Contrast media allergy 06/26/2020   AAA (abdominal aortic aneurysm) without rupture 06/26/2020   Upper airway cough syndrome 12/20/2019   Hip fracture (HCDoor02/25/2021   Venous insufficiency 09/10/2018   Sinusitis 06/25/2018   Ophthalmic migraine 03/16/2018   COPD, group B, by GOLD 2017 classification (HCHighland [place]04/08/2017   Chronic hypoxemic respiratory failure (HCCarmel04/07/2017   Ventral hernia without obstruction or gangrene 05/15/2017   Cirrhosis of liver without ascites (HCHamilton09/05/2017   Urothelial cancer (HCStrathmere09/04/2017   Atherosclerosis of coronary artery 09/14/2016   Aortic atherosclerosis (HCDeer [place]03/18/2018   Class 2 severe obesity due to excess calories with serious comorbidity and body mass index (BMI) of 39.0 to 39.9 in adult (HCArcadia 11/10/2015   Thrombocytopenia (HCHall[place]02/17/2017   Elevated troponin 08/17/2015   Diabetes mellitus type 2 in obese (HCDrowning Creek02/17/2017   Severe obesity (BMI >= 40) (HCBrush Creek04/01/2015   FHx: BRCA2 gene positive    HSV (herpes simplex virus) infection 06/07/2012   Pure hypercholesterolemia 11/13/2010   Essential hypertension, benign 11/13/2010    Immunization History  Administered Date(s) Administered   Fluad Quad(high Dose 65+) 04/07/2019, 04/26/2020, 05/08/2021   Influenza Split 05/28/2011   Influenza, High Dose Seasonal PF 04/07/2013, 05/15/2014, 05/30/2015, 03/25/2016, 03/05/2017, 04/01/2018   Influenza, Seasonal, Injecte, Preservative Fre 06/07/2012   PFIZER Comirnaty(Gray Top)Covid-19 Tri-Sucrose Vaccine 10/25/2020   PFIZER(Purple Top)SARS-COV-2 Vaccination 08/20/2019, 09/13/2019, 04/14/2020   Pfizer Covid-19 Vaccine Bivalent Booster 1250yr up 05/08/2021   Pneumococcal Conjugate-13 07/06/2014   Pneumococcal Polysaccharide-23 12/15/2004, 05/28/2011, 05/27/2021   Tdap 02/23/2008, 06/15/2018   Zoster Recombinat (Shingrix) 12/02/2017, 02/06/2018  Zoster, Live 05/17/2010    Patient reported she had a very good Christmas. She celebrated Christmas a few days earlier with her youngest son and his 4 kids that range in age from 34 to 1 years old. She spent Christmas day with her husband and said it was pretty calm.   She feels that the fluid has gone down significantly. She is taking the fluid pill every day execept when she goes places and has cut out salt completely, she even stopped adding it to her eggs. She reports her right leg is still swollen on her knee. She has also had multiple rounds of steroids and a steroid injection last week so her blood sugars have been all over the place.   Conditions to be addressed/monitored:  Hypertension, Hyperlipidemia, Diabetes, COPD, and Allergic Rhinitis  Conditions addressed this visit: Diabetes, hypertension  Care Plan : CCM Pharmacy Care Plan   Updates made by Viona Gilmore, [place] since 07/11/2021 12:00 AM     Problem: Problem: Hypertension, Hyperlipidemia, Diabetes, COPD, and Allergic Rhinitis      Long-Range Goal: Patient-Specific Goal   Start Date: 04/25/2021  Expected End Date: 04/25/2022  Recent Progress: On track  Priority: High  Note:   Current Barriers:  Unable to independently afford treatment regimen Unable to independently monitor therapeutic efficacy  Pharmacist Clinical Goal(s):  Patient will verbalize ability to afford treatment regimen achieve adherence to monitoring guidelines and medication adherence to achieve therapeutic efficacy through collaboration with PharmD and provider.   Interventions: 1:1 collaboration with [name], MD regarding development and update of comprehensive plan of care as evidenced by provider attestation and co-signature Inter-disciplinary care team collaboration (see longitudinal plan of care) Comprehensive medication review performed; medication list updated in electronic medical record  Hypertension (BP goal <140/90) -Controlled -Current treatment: Bisoprolol-HCTZ 10-6.25 mg 1 tablet daily - appropriate, effective, safe, accessible -Medications previously tried: none  -Current home readings:  checking whenever she thinks about it -Current dietary habits: pays attention salt intake; sometimes looks at package labels; doesn't salt her food and uses [place] pink salt -Current exercise habits: limited with walker -Denies hypotensive/hypertensive symptoms -Educated on BP goals and benefits of medications for prevention of heart attack, stroke and kidney damage; Importance of home blood pressure monitoring; Proper BP monitoring technique; -Counseled to monitor BP at home weekly, document, and provide log at future appointments -Counseled on diet and exercise extensively Recommended to continue current medication  Edema (Goal: minimize swelling) -Controlled -Current  treatment  Furosemide 20 mg 1 tablet daily  - appropriate, effective, safe, accessible -Medications previously tried: none  -Recommended to continue current medication Recommended use of compression stockings and elevating her feet.  Hyperlipidemia: (LDL goal < 70) -Controlled -Current treatment: Simvastatin 20 mg 1 tablet daily at bedtime  - appropriate, effective, safe, accessible -Medications previously tried: none  -Current dietary patterns: does not fry foods often; uses an air fryer and olive oil or coconut oil -Current exercise habits: minimal with walker -Educated on Cholesterol goals;  -Counseled on diet and exercise extensively Recommended to continue current medication  Diabetes (A1c goal <7%) -Controlled -Current medications: Metformin XR 500 mg 1 tablet three times daily  - appropriate, effective, safe, accessible Ozempic 0.5 mg inject once weekly  - appropriate, effective, safe, accessible -Medications previously tried: none  -Current home glucose readings fasting glucose: 170 (after sweet potatoes), 130, 167, 224 (after steroid shot), 242, 133, 139, 137 (higher with steroids) post prandial glucose: 235  -Denies hypoglycemic/hyperglycemic symptoms -Current meal  patterns: usually 2 meals  breakfast: 2 slices of bacon and scrambled eggs lunch: sometimes late lunch; yogurt in afternoon (triple O) dinner: fries in the oven (even spread between carbs and green vegetables)  snacks: chips consistent with last 56 days diet drinks: water and coffee and some milk -Current exercise: minimal with walker -Educated on A1c and blood sugar goals; Carbohydrate counting and/or plate method -Counseled to check feet daily and get yearly eye exams -Counseled on diet and exercise extensively Recommended to continue current medication Recommended checking blood sugars at various times of the day Discuss diet in detail with a focus on adding more green vegetables to her plate.  COPD  (Goal: control symptoms and prevent exacerbations) -Controlled -Current treatment  Albuterol nebulizer as needed (not using) Anoro Ellipta 62.5-25 mcg/inh 1 puff daily Albuterol HFA 2 puffs every 6 hours as needed -Medications previously tried: n/a  -Gold Grade: Gold 2 (FEV1 50-79%) -Current COPD Classification:  B (high sx, <2 exacerbations/yr) -MMRC/CAT score: 1/12 -Pulmonary function testing: 09/14/16 -Exacerbations requiring treatment in last 6 months: none -Patient reports consistent use of maintenance inhaler -Frequency of rescue inhaler use: not often (not even once a month) -Counseled on Proper inhaler technique; Differences between maintenance and rescue inhalers -Counseled on diet and exercise extensively Recommended to continue current medication Plan to apply for PAP for Anoro next year.  Allergic rhinitis/congestion (Goal: minimize symptoms) -Controlled -Current treatment  Montelukast 10 mg 1 tablet at bedtime  - appropriate, effective, safe, accessible Cetirizine 10 mg 1 tablet daily  - appropriate, effective, safe, accessible Guaifenesin 1200 mg daily at bedtime  - appropriate, effective, safe, accessible -Medications previously tried: none  -Recommended to continue current medication  Aortic atherosclerosis (Goal: prevent heart events) -Controlled -Current treatment  Aspirin 81 mg 1 tablet daily  - appropriate, effective, safe, accessible -Medications previously tried: Plavix -Recommended to continue current medication Counseled on avoidance of NSAIDs.   Health Maintenance -Vaccine gaps: influenza, COVID booster -Current therapy:  Multivitamin 1 tablet daily Triamcinolone 0.1% apply daily Valacyclovir 500 mg 1 tablet daily Tylenol 500 mg 1 tablet as needed Calcium-magnesium-zinc-vit D 500 mg, 80 mg mag, 10 mg of zinc, 800 units of vitamin D one daily - 2 tablet (total of that) Tylenol 500 mg 1 tablet as needed   -Educated on Cost vs benefit of each  product must be carefully weighed by individual consumer -Patient is satisfied with current therapy and denies issues -Recommended to continue current medication Recommended repeat DEXA.  Patient Goals/Self-Care Activities Patient will:  - take medications as prescribed check glucose daily, document, and provide at future appointments check blood pressure weekly, document, and provide at future appointments  Follow Up Plan: The care management team will reach out to the patient again over the next 30 days.        Medication Assistance:  Ozempic obtained through [place] medication assistance program.  Enrollment ends 06/29/22  Compliance/Adherence/Medication fill history: Care Gaps: Foot exam - overdue Covid-19 vaccine booster 5 - overdue since 12/20/20  Flu vaccine - due  Last PCP BP: 132/70 Last A1C: 6.9  Star-Rating Drugs: Metformin 576m - [facility] [facility]  [place] [facility] [place]: [phone]: [phone] -  [place], [place] [address] Phone: [phone] Fax: [place] [address] [place] [address] Phone: [phone] Fax: [phone]   Uses pill box? Yes - weekly medication Pt endorses 95% compliance  We discussed: Current pharmacy is preferred with insurance plan and patient is satisfied with pharmacy services Patient decided to: Continue current medication management strategy  Care Plan and Follow Up Patient Decision:  Patient agrees to Care Plan and Follow-up.  Plan: The care management team will reach out to the patient again over the next 30 days.  Jeni Salles, PharmD, [facility] [phone]

## 2021-07-11 NOTE — Patient Instructions (Addendum)
Hi Shannon Schultz,  It was great to catch up with you again! Don't forget to try to alternate with checking your blood sugars multiple times a day and see if you can make a shift in your meals to have more green vegetables (non starchy vegetables) on your plate as they will affect your blood sugars less.  Keep me posted if you need any Ozempic!  Best, Maddie  Jeni Salles, PharmD, [place] [phone]  Visit Information   Goals Addressed   None    Patient Care Plan: CCM Pharmacy Care Plan     Problem Identified: Problem: Hypertension, Hyperlipidemia, Diabetes, COPD, and Allergic Rhinitis      Long-Range Goal: Patient-Specific Goal   Start Date: 04/25/2021  Expected End Date: 04/25/2022  Recent Progress: On track  Priority: High  Note:   Current Barriers:  Unable to independently afford treatment regimen Unable to independently monitor therapeutic efficacy  Pharmacist Clinical Goal(s):  Patient will verbalize ability to afford treatment regimen achieve adherence to monitoring guidelines and medication adherence to achieve therapeutic efficacy through collaboration with PharmD and provider.   Interventions: 1:1 collaboration with [name], MD regarding development and update of comprehensive plan of care as evidenced by provider attestation and co-signature Inter-disciplinary care team collaboration (see longitudinal plan of care) Comprehensive medication review performed; medication list updated in electronic medical record  Hypertension (BP goal <140/90) -Controlled -Current treatment: Bisoprolol-HCTZ 10-6.25 mg 1 tablet daily - appropriate, effective, safe, accessible -Medications previously tried: none  -Current home readings:  checking whenever she thinks about it -Current dietary habits: pays attention salt intake; sometimes looks at package labels; doesn't salt her food and uses [place] pink salt -Current exercise habits:  limited with walker -Denies hypotensive/hypertensive symptoms -Educated on BP goals and benefits of medications for prevention of heart attack, stroke and kidney damage; Importance of home blood pressure monitoring; Proper BP monitoring technique; -Counseled to monitor BP at home weekly, document, and provide log at future appointments -Counseled on diet and exercise extensively Recommended to continue current medication  Edema (Goal: minimize swelling) -Controlled -Current treatment  Furosemide 20 mg 1 tablet daily  - appropriate, effective, safe, accessible -Medications previously tried: none  -Recommended to continue current medication Recommended use of compression stockings and elevating her feet.  Hyperlipidemia: (LDL goal < 70) -Controlled -Current treatment: Simvastatin 20 mg 1 tablet daily at bedtime  - appropriate, effective, safe, accessible -Medications previously tried: none  -Current dietary patterns: does not fry foods often; uses an air fryer and olive oil or coconut oil -Current exercise habits: minimal with walker -Educated on Cholesterol goals;  -Counseled on diet and exercise extensively Recommended to continue current medication  Diabetes (A1c goal <7%) -Controlled -Current medications: Metformin XR 500 mg 1 tablet three times daily  - appropriate, effective, safe, accessible Ozempic 0.5 mg inject once weekly  - appropriate, effective, safe, accessible -Medications previously tried: none  -Current home glucose readings fasting glucose: 170 (after sweet potatoes), 130, 167, 224 (after steroid shot), 242, 133, 139, 137 (higher with steroids) post prandial glucose: 235  -Denies hypoglycemic/hyperglycemic symptoms -Current meal patterns: usually 2 meals  breakfast: 2 slices of bacon and scrambled eggs lunch: sometimes late lunch; yogurt in afternoon (triple O) dinner: fries in the oven (even spread between carbs and green vegetables)  snacks: chips  consistent with last 56 days diet drinks: water and coffee and some milk -Current exercise: minimal with walker -Educated on A1c and blood sugar goals; Carbohydrate counting and/or  plate method -Counseled to check feet daily and get yearly eye exams -Counseled on diet and exercise extensively Recommended to continue current medication Recommended checking blood sugars at various times of the day Discuss diet in detail with a focus on adding more green vegetables to her plate.  COPD (Goal: control symptoms and prevent exacerbations) -Controlled -Current treatment  Albuterol nebulizer as needed (not using) Anoro Ellipta 62.5-25 mcg/inh 1 puff daily Albuterol HFA 2 puffs every 6 hours as needed -Medications previously tried: n/a  -Gold Grade: Gold 2 (FEV1 50-79%) -Current COPD Classification:  B (high sx, <2 exacerbations/yr) -MMRC/CAT score: 1/12 -Pulmonary function testing: 09/14/16 -Exacerbations requiring treatment in last 6 months: none -Patient reports consistent use of maintenance inhaler -Frequency of rescue inhaler use: not often (not even once a month) -Counseled on Proper inhaler technique; Differences between maintenance and rescue inhalers -Counseled on diet and exercise extensively Recommended to continue current medication Plan to apply for PAP for Anoro next year.  Allergic rhinitis/congestion (Goal: minimize symptoms) -Controlled -Current treatment  Montelukast 10 mg 1 tablet at bedtime  - appropriate, effective, safe, accessible Cetirizine 10 mg 1 tablet daily  - appropriate, effective, safe, accessible Guaifenesin 1200 mg daily at bedtime  - appropriate, effective, safe, accessible -Medications previously tried: none  -Recommended to continue current medication  Aortic atherosclerosis (Goal: prevent heart events) -Controlled -Current treatment  Aspirin 81 mg 1 tablet daily  - appropriate, effective, safe, accessible -Medications previously tried:  Plavix -Recommended to continue current medication Counseled on avoidance of NSAIDs.   Health Maintenance -Vaccine gaps: influenza, COVID booster -Current therapy:  Multivitamin 1 tablet daily Triamcinolone 0.1% apply daily Valacyclovir 500 mg 1 tablet daily Tylenol 500 mg 1 tablet as needed Calcium-magnesium-zinc-vit D 500 mg, 80 mg mag, 10 mg of zinc, 800 units of vitamin D one daily - 2 tablet (total of that) Tylenol 500 mg 1 tablet as needed   -Educated on Cost vs benefit of each product must be carefully weighed by individual consumer -Patient is satisfied with current therapy and denies issues -Recommended to continue current medication Recommended repeat DEXA.  Patient Goals/Self-Care Activities Patient will:  - take medications as prescribed check glucose daily, document, and provide at future appointments check blood pressure weekly, document, and provide at future appointments  Follow Up Plan: The care management team will reach out to the patient again over the next 30 days.        Patient verbalizes understanding of instructions and care plan provided today and agrees to view in [place]. Active MyChart status confirmed with patient.   The pharmacy team will reach out to the patient again over the next 30 days.   Viona Gilmore, [facility]

## 2021-07-12 ENCOUNTER — Other Ambulatory Visit: Payer: Medicare HMO

## 2021-07-12 ENCOUNTER — Other Ambulatory Visit: Payer: Self-pay

## 2021-07-15 ENCOUNTER — Encounter: Payer: Self-pay | Admitting: *Deleted

## 2021-07-17 ENCOUNTER — Telehealth: Payer: Self-pay

## 2021-07-17 NOTE — Telephone Encounter (Signed)
Recv'd fax from [employer] pt is approved til 05/29/22

## 2021-07-19 ENCOUNTER — Other Ambulatory Visit: Payer: Self-pay | Admitting: Family Medicine

## 2021-07-19 ENCOUNTER — Encounter: Payer: Self-pay | Admitting: Family Medicine

## 2021-07-19 DIAGNOSIS — E119 Type 2 diabetes mellitus without complications: Secondary | ICD-10-CM

## 2021-07-22 ENCOUNTER — Other Ambulatory Visit: Payer: Self-pay | Admitting: *Deleted

## 2021-07-22 ENCOUNTER — Other Ambulatory Visit: Payer: Self-pay

## 2021-07-22 DIAGNOSIS — I872 Venous insufficiency (chronic) (peripheral): Secondary | ICD-10-CM

## 2021-07-22 MED ORDER — LANCETS MISC: 1.0000 | Freq: Every day | 2 refills | Status: DC

## 2021-07-22 MED ORDER — GLUCOSE BLOOD VI STRP: 1.0000 | ORAL_STRIP | Freq: Every day | 2 refills | Status: DC

## 2021-07-23 MED ORDER — LANCETS MISC: 0 refills | Status: DC

## 2021-07-25 DIAGNOSIS — H26491 Other secondary cataract, right eye: Secondary | ICD-10-CM | POA: Diagnosis not present

## 2021-07-25 DIAGNOSIS — L309 Dermatitis, unspecified: Secondary | ICD-10-CM | POA: Diagnosis not present

## 2021-07-25 DIAGNOSIS — I872 Venous insufficiency (chronic) (peripheral): Secondary | ICD-10-CM | POA: Diagnosis not present

## 2021-07-25 DIAGNOSIS — L259 Unspecified contact dermatitis, unspecified cause: Secondary | ICD-10-CM | POA: Diagnosis not present

## 2021-07-30 DIAGNOSIS — I152 Hypertension secondary to endocrine disorders: Secondary | ICD-10-CM

## 2021-07-30 DIAGNOSIS — E1159 Type 2 diabetes mellitus with other circulatory complications: Secondary | ICD-10-CM

## 2021-07-31 ENCOUNTER — Ambulatory Visit (HOSPITAL_COMMUNITY): Payer: Medicare HMO | Attending: Physician Assistant

## 2021-07-31 ENCOUNTER — Ambulatory Visit: Payer: Medicare HMO | Admitting: Physician Assistant

## 2021-07-31 ENCOUNTER — Other Ambulatory Visit: Payer: Self-pay

## 2021-07-31 ENCOUNTER — Other Ambulatory Visit (HOSPITAL_COMMUNITY): Payer: Medicare HMO

## 2021-07-31 VITALS — BP 136/76 | HR 80 | Ht 64.0 in | Wt 225.4 lb

## 2021-07-31 DIAGNOSIS — Z952 Presence of prosthetic heart valve: Secondary | ICD-10-CM | POA: Insufficient documentation

## 2021-07-31 DIAGNOSIS — I10 Essential (primary) hypertension: Secondary | ICD-10-CM | POA: Diagnosis not present

## 2021-07-31 DIAGNOSIS — J449 Chronic obstructive pulmonary disease, unspecified: Secondary | ICD-10-CM | POA: Diagnosis not present

## 2021-07-31 DIAGNOSIS — I5032 Chronic diastolic (congestive) heart failure: Secondary | ICD-10-CM

## 2021-07-31 LAB — ECHOCARDIOGRAM COMPLETE
AR max vel: 1.21 cm2
AV Area VTI: 1.21 cm2
AV Area mean vel: 1.24 cm2
AV Mean grad: 12 mmHg
AV Peak grad: 19.4 mmHg
Ao pk vel: 2.2 m/s
Area-P 1/2: 5.46 cm2
S' Lateral: 3.8 cm

## 2021-07-31 MED ORDER — AMOXICILLIN 500 MG PO CAPS: ORAL_CAPSULE | ORAL | 3 refills | Status: DC

## 2021-07-31 NOTE — Progress Notes (Signed)
HEART AND [place]                                     Cardiology Office Note:    Date:  08/01/2021   ID:  [name], DOB September 01, 1945, MRN [id number]  PCP:  Shannon Ohara, MD  [facility] Cardiologist:  Dr. Sallyanne Kuster / Dr. Angelena Form & Dr. Roxy Manns (TAVR) [facility] Electrophysiologist:  None   Referring MD: Shannon Ohara, MD   1 year s/p TAVR  History of Present Illness:    Shannon Schultz is a 76 y.o. female with a hx of breast cancer, bladder cancer, HTN, HLD, COPD with nocturnal home oxygen therapy and obstructive sleep apnea , DMT2, morbid obesity, NAFLD, AAA and severe AS s/p TAVR (08/14/20) who presents to clinic for follow up.  She has been followed by Dr. Sallyanne Kuster with known history of aortic stenosis. Echocardiogram 05/2020 revealed significant progression and severity of aortic stenosis with preserved left ventricular function (65%).  The aortic valve appeared trileaflet with severe thickening, calcification, and restricted leaflet mobility.  Peak velocity across aortic valve measured 3.8 m/s corresponding to mean transvalvular gradient estimated 38 mmHg and aortic valve area calculated only 0.70 cm by VTI. DVI was reported 0.22 with stroke-volume index 34. The patient was referred to the multidisciplinary heart valve clinic. Diagnostic cardiac catheterization performed July 05, 2020 revealed mild nonobstructive disease and confirmed the presence of aortic stenosis with peak to peak and mean transvalvular gradients measured 44 and 34.6 mmHg respectively. CT angio revealed a very tortuous/aneurysmal abd aorta. CTA also showed possible cirrhosis and splenomegaly, she was suspected to have NASH cirrhosis.   She was evaluated by the multidisciplinary valve team and underwent successful TAVR with a 26 mm Edwards Sapien 3 Ultra THV via the left subclavian approach on 08/14/20. Post operative echo showed EF 65%, normally functioning TAVR with a mean  gradient of 8.7 mmHg and no PVL. She was continued on her home aspirin and Plavix 103m daily was added at discharge. 1 month echo showed EF 65%, normally functioning TAVR with a mean gradient of 12 mm hg and no PVL. She had continued DOE although improved. She also had some palpitations and subsequent monitor showed rare and brief nonsustained atrial tachycardia and ventricular tachycardia. We offered to increase her BB but patient decided to hold off.   Today she presents to clinic for follow up. No CP or SOB. Chronic LE edema with venous stasis. Has swelling in right knee that has been ongoing. No orthopnea or PND. No dizziness or syncope. No blood in stool or urine. No palpitations. She walks with a walker. She is mostly limited by her femur pain. Also has had an itchy rash that she was told was eczema.     Past Medical History:  Diagnosis Date   Arthritis    "hands" (05/15/2017)   Bladder cancer [place]   Breast cancer, right [place] 1992   DCIS,bladder ca (just dx)   Colon polyp    Complication of anesthesia 1992   "local anesthesia" used was hard to awaken from-no problems since (05/15/2017)   COPD (chronic obstructive pulmonary disease) ([facility]    Coronary artery disease    COVID-19 virus infection 05/05/2019   Diverticulosis    Elevated liver enzymes    fatty liver per ultrasound per pt   FHx: BRCA2 gene positive  sister with BRCA2 mutation (pt tested NEGATIVE)   HLD (hyperlipidemia)    HSV (herpes simplex virus) infection    on hip--on daily suppression   Hypertension    Hypothyroidism    took med 7 yrs after birth of 1st child   Impaired glucose tolerance    Migraine    On home oxygen therapy    "have it available but I'm not using it" (05/15/2017)   Osteopenia    S/P TAVR (transcatheter aortic valve replacement) 08/14/2020   s/p TAVR with a 26 mm Edwards S3U via the left subclavian approach by Dr. Roxy Manns and Dr. Angelena Form   Severe aortic stenosis    Sleep apnea     Sleeps with 2 L O2 @ night   Type 2 diabetes mellitus ([place])    Ventral hernia    Vitamin D deficiency disease     Past Surgical History:  Procedure Laterality Date   ABDOMINAL HERNIA REPAIR  05/15/2017   BREAST BIOPSY Right 1992   BREAST BIOPSY Left 2018   BREAST EXCISIONAL BIOPSY Left 2018   ATYPICAL DUCTAL HYPERPLASIA INVOLVING A COMPLEX   BREAST LUMPECTOMY WITH RADIOACTIVE SEED LOCALIZATION Left 12/22/2016   Procedure: LEFT BREAST LUMPECTOMY WITH RADIOACTIVE SEED LOCALIZATION;  Surgeon: Jovita Kussmaul, MD;  Location: [facility];  Service: General;  Laterality: Left;   CARDIAC CATHETERIZATION     CATARACT EXTRACTION, BILATERAL Bilateral R 03/16/2019 L 03/30/2019   Dr. Kathlen Mody   COLONOSCOPY  2009, 08/2010, 12/2015   Dr. Collene Mares; "only 1 had any polyps" (05/15/2017)   CYSTOSCOPY  04/23/2017   CYSTOSCOPY W/ RETROGRADES Bilateral 01/12/2017   Procedure: CYSTOSCOPY WITH RETROGRADE PYELOGRAM/ EXAM UNDER ANESTHESIA;  Surgeon: Raynelle Bring, MD;  [facility]: [facility];  Service: Urology;  Laterality: Bilateral;   EYE SURGERY     FEMUR IM NAIL Right 08/24/2019   Procedure: INTRAMEDULLARY (IM) NAIL FEMORAL;  Surgeon: Rod Can, MD;  Location: [facility];  Service: Orthopedics;  Laterality: Right;   HERNIA REPAIR     INSERTION OF MESH N/A 05/15/2017   Procedure: INSERTION OF MESH;  Surgeon: Jovita Kussmaul, MD;  Location: [facility];  Service: General;  Laterality: N/A;   LAPAROSCOPIC CHOLECYSTECTOMY  2003   MASTECTOMY Right 1992   RIGHT/LEFT HEART CATH AND CORONARY ANGIOGRAPHY N/A 07/05/2020   Procedure: RIGHT/LEFT HEART CATH AND CORONARY ANGIOGRAPHY;  Surgeon: Burnell Blanks, MD;  Location: [facility];  Service: Cardiovascular;  Laterality: N/A;   TEE WITHOUT CARDIOVERSION N/A 08/14/2020   Procedure: TRANSESOPHAGEAL ECHOCARDIOGRAM (TEE);  Surgeon: Burnell Blanks, MD;  Location: [facility];  Service: Open Heart Surgery;  Laterality: N/A;   TRANSURETHRAL RESECTION OF BLADDER TUMOR WITH  MITOMYCIN-C N/A 01/12/2017   Procedure: TRANSURETHRAL RESECTION OF BLADDER TUMOR WITH POSSIBLE POST OPERATIVE INSTILLATION OF MITOMYCIN-C;  Surgeon: Raynelle Bring, MD;  [facility]: [facility];  Service: Urology;  Laterality: N/A;   TYMPANOSTOMY TUBE PLACEMENT Bilateral 6606T   UMBILICAL HERNIA REPAIR  2003   VENTRAL HERNIA REPAIR N/A 05/15/2017   Procedure: VENTRAL HERNIA REPAIR WITH MESH;  Surgeon: Autumn Messing III, MD;  Location: [facility];  Service: General;  Laterality: N/A;    Current Medications: Current Meds  Medication Sig   acetaminophen (TYLENOL) 500 MG tablet Take 1,000 mg by mouth every 6 (six) hours as needed for moderate pain or headache.   albuterol (PROVENTIL) (2.5 MG/3ML) 0.083% nebulizer solution USE 1 VIAL BY NEBULIZATION EVERY FOUR HOURS AS NEEDED FOR WHEEZING OR SHORTNESS OF BREATH.   albuterol (VENTOLIN  HFA) 108 (90 Base) MCG/ACT inhaler Inhale 2 puffs into the lungs every 6 (six) hours as needed for wheezing or shortness of breath.   Alcohol Swabs (B-D SINGLE USE SWABS REGULAR) PADS Use twice a day when checking blood sugars   aspirin EC 81 MG tablet Take 81 mg by mouth daily. Swallow whole.   bisoprolol-hydrochlorothiazide (ZIAC) 10-6.25 MG tablet TAKE 1 TABLET EVERY DAY   CALCIUM ACETATE-MAGNESIUM CARB PO Take 1 tablet by mouth daily.   cetirizine (ZYRTEC) 10 MG tablet Take 10 mg by mouth 2 (two) times daily.   furosemide (LASIX) 20 MG tablet Take 1 tablet (20 mg total) by mouth daily.   glucose blood test strip 1 each by Other route daily. Use as instructed   guaiFENesin (MUCINEX) 600 MG 12 hr tablet Take 1,200 mg by mouth daily.   Lancets MISC 1 each by Does not apply route daily.   metFORMIN (GLUCOPHAGE-XR) 500 MG 24 hr tablet Take 1 tablet (500 mg total) by mouth daily with breakfast.   montelukast (SINGULAIR) 10 MG tablet TAKE 1 TABLET AT BEDTIME   Semaglutide,0.25 or 0.5MG /DOS, (OZEMPIC, 0.25 OR 0.5 MG/DOSE,) 2 MG/1.5ML SOPN Inject 0.25 mg into the skin once a week for 4  weeks then increase to 0.5mg  weekly for 8 weeks   simvastatin (ZOCOR) 20 MG tablet TAKE 1 TABLET AT BEDTIME   triamcinolone cream (KENALOG) 0.1 % Apply 1 application topically as needed (rash or redness).   umeclidinium-vilanterol (ANORO ELLIPTA) 62.5-25 MCG/INH AEPB Inhale 1 puff into the lungs daily.   valACYclovir (VALTREX) 500 MG tablet Take 1 tablet (500 mg total) by mouth daily.   [DISCONTINUED] amoxicillin (AMOXIL) 500 MG capsule Take 4 capsules by mouth 1 hr before dental procedure     Allergies:   Contrast media [iodinated contrast media], Iohexol, Lisinopril, Sulfa antibiotics, Latex, Nickel, Codeine, Levofloxacin, Lipitor [atorvastatin calcium], Meloxicam, and Vicodin [hydrocodone-acetaminophen]   Social History   Socioeconomic History   Marital status: Married    Spouse name: Not on file   Number of children: 3   Years of education: Not on file   Highest education level: Not on file  Occupational History   Occupation: Retired  Tobacco Use   Smoking status: Former    Packs/day: 1.00    Years: 46.00    Pack years: 46.00    Types: Cigarettes    Quit date: 05/31/2011    Years since quitting: 10.1   Smokeless tobacco: Never  Vaping Use   Vaping Use: Never used  Substance and Sexual Activity   Alcohol use: No   Drug use: No   Sexual activity: Yes    Partners: Male    Birth control/protection: Post-menopausal  Other Topics Concern   Not on file  Social History Narrative   Lives with her husband.  Children all live in [place] nearby. No pets. 6 grandchildren.   Social Determinants of Health   Financial Resource Strain: Low Risk    Difficulty of Paying Living Expenses: Not very hard  Food Insecurity: Not on file  Transportation Needs: No Transportation Needs   Lack of Transportation (Medical): No   Lack of Transportation (Non-Medical): No  Physical Activity: Not on file  Stress: Not on file  Social Connections: Not on file     Family History: The patient's family  history includes Allergies in her sister and sister; Asthma in her sister and sister; Breast cancer (age of onset: 79) in her sister; Cancer in her maternal grandmother and another family  member; Diabetes in her father; Fibromyalgia in her sister and sister; Hashimoto's thyroiditis in her sister and sister; Heart disease in her father and mother; Hyperlipidemia in her sister and sister; Hypertension in her father; Stroke (age of onset: 22) in her paternal grandmother; Tuberculosis in her maternal grandfather.  ROS:   Please see the history of present illness.    All other systems reviewed and are negative.  EKGs/Labs/Other Studies Reviewed:    The following studies were reviewed today:  TAVR OPERATIVE NOTE     Date of Procedure:                08/14/2020   Preoperative Diagnosis:      Severe Aortic Stenosis    Postoperative Diagnosis:    Same    Procedure:        Transcatheter Aortic Valve Replacement - Left Trans-Subclavian Approach             Edwards Sapien 3 UltraTHV (size 26 mm, model # 9750TFX, serial # [id number])              Co-Surgeons:                        Valentina Gu. Roxy Manns, MD and Lauree Chandler, MD   Anesthesiologist:                  Albertha Ghee, MD   Echocardiographer:              Sanda Klein, MD   Pre-operative Echo Findings: Severe aortic stenosis Normal left ventricular systolic function   Post-operative Echo Findings: No paravalvular leak Normal left ventricular systolic function   _____________    Echo 08/15/20: IMPRESSIONS   1. Left ventricular ejection fraction, by estimation, is 65 to 70%. The  left ventricle has hyperdynamic function. The left ventricle has no  regional wall motion abnormalities. There is mild concentric left  ventricular hypertrophy. Left ventricular  diastolic parameters are consistent with Grade I diastolic dysfunction  (impaired relaxation). Elevated left atrial pressure.   2. Right ventricular systolic function is  normal. The right ventricular  size is normal.   3. Left atrial size was mildly dilated.   4. The mitral valve is normal in structure. Trivial mitral valve  regurgitation.   5. The aortic valve has been repaired/replaced. Aortic valve  regurgitation is not visualized. There is a 26 mm Edwards Sapien  prosthetic (TAVR) valve present in the aortic position. Procedure Date:  08/14/2020. Aortic valve mean gradient measures 8.7  mmHg. Aortic valve Vmax measures 2.09 m/s. Acceleration time 88 ms.   6. The inferior vena cava is normal in size with greater than 50%  respiratory variability, suggesting right atrial pressure of 3 mmHg.   __________________________  Echo 09/12/2020 IMPRESSIONS   1. Left ventricular ejection fraction, by estimation, is 65 to 70%. The  left ventricle has normal function. The left ventricle has no regional  wall motion abnormalities. There is mild left ventricular hypertrophy.  Left ventricular diastolic parameters  are consistent with Grade I diastolic dysfunction (impaired relaxation).   2. Right ventricular systolic function is normal. The right ventricular  size is normal. There is mildly elevated pulmonary artery systolic  pressure. The estimated right ventricular systolic pressure is 74.9 mmHg.   3. The mitral valve is normal in structure. Trivial mitral valve  regurgitation.   4. The inferior vena cava is normal in size with greater than 50%  respiratory variability, suggesting  right atrial pressure of 3 mmHg.   5. There is a 26 mm Edwards Sapien prosthetic, stented (TAVR) valve  present in the aortic position. Aortic valve regurgitation is not  visualized. Echo findings are consistent with normal structure and  function of the aortic valve prosthesis. Vmax 2.5  m/s, AT 65ms, MG 12 mmHg, EOA 1.4 cm^2, DI 0.4   _____________________  ZIO 3/16-10/03/20  Study Highlights    The baseline rhythm is normal sinus rhythm with normal circadian variation. Rare  PACs and PVCs are seen. There are rare episodes of nonsustained supraventricular tachycardia and nonsustained ventricular tachycardia. There are no sustained arrhythmia, no atrial fibrillation and no bradycardia events. Patient symptoms are more commonly associated with VT and PVC events, but also with one SVT event.     Abnormal event monitor due to rare and brief nonsustained atrial tachycardia and ventricular tachycardia. Symptoms are most commonly attributable to nonsustained VT or periods of frequent isolated PVCs.    ______________________   Echo 07/31/21 IMPRESSIONS   1. Left ventricular ejection fraction, by estimation, is 60 to 65%. The  left ventricle has normal function. The left ventricle has no regional  wall motion abnormalities. There is mild left ventricular hypertrophy of  the basal-septal segment. Left  ventricular diastolic parameters are consistent with Grade I diastolic  dysfunction (impaired relaxation). The average left ventricular global  longitudinal strain is -17.2 %. The global longitudinal strain is normal.   2. Right ventricular systolic function is normal. The right ventricular  size is normal. There is normal pulmonary artery systolic pressure. The  estimated right ventricular systolic pressure is 16.3 mmHg.   3. The mitral valve is normal in structure. Trivial mitral valve  regurgitation. No evidence of mitral stenosis.   4. The aortic valve has been repaired/replaced. Aortic valve  regurgitation is not visualized. No aortic stenosis is present. There is a  26 mm Sapien prosthetic (TAVR) valve present in the aortic position.  Procedure Date: 08/14/2020. Aortic valve mean  gradient measures 12.0 mmHg. Aortic valve Vmax measures 2.20 m/s.   5. The inferior vena cava is normal in size with greater than 50%  respiratory variability, suggesting right atrial pressure of 3 mmHg.   6. No significant change from prior echo.   EKG:  EKG is NOT ordered today.    Recent Labs: 08/15/2020: Magnesium 1.9 04/02/2021: B Natriuretic Peptide 55.4; Hemoglobin 13.4; Platelets 100 05/06/2021: ALT 28; TSH 2.170 06/17/2021: BUN 13; Creatinine, Ser 0.64; Potassium 4.7; Sodium 149  Recent Lipid Panel    Component Value Date/Time   CHOL 130 10/22/2020 0840   TRIG 86 10/22/2020 0840   HDL 54 10/22/2020 0840   CHOLHDL 2.4 10/22/2020 0840   CHOLHDL 2.2 08/18/2016 1346   VLDL 19 08/18/2016 1346   LDLCALC 59 10/22/2020 0840     Risk Assessment/Calculations:       Physical Exam:    VS:  BP 136/76 (BP Location: Left Arm, Patient Position: Sitting, Cuff Size: Large)    Pulse 80    Ht $R'5\' 4"'PZ$  (1.626 m)    Wt 225 lb 6.4 oz (102.2 kg)    LMP  (LMP Unknown)    SpO2 96%    BMI 38.69 kg/m     Wt Readings from Last 3 Encounters:  07/31/21 225 lb 6.4 oz (102.2 kg)  06/27/21 229 lb (103.9 kg)  06/17/21 229 lb 6.4 oz (104.1 kg)     GEN:  Well nourished, well developed in no acute distress, obese  HEENT: Normal NECK: No JVD; No carotid bruits LYMPHATICS: No lymphadenopathy CARDIAC: RRR, 2/6 SEM @ RUSB. No rubs, gallops. 1+ bilateral LE edema, chronic venous stasis changes. Right knee swelling RESPIRATORY:  Clear to auscultation without rales, wheezing or rhonchi  ABDOMEN: Soft, non-tender, non-distended MUSCULOSKELETAL:  No edema; No deformity  SKIN: Warm and dry.   NEUROLOGIC:  Alert and oriented x 3 PSYCHIATRIC:  Normal affect   ASSESSMENT:    1. S/P TAVR (transcatheter aortic valve replacement)   2. Essential hypertension, benign   3. Chronic obstructive pulmonary disease, unspecified COPD type ([place])   4. Chronic diastolic CHF (congestive heart failure) ([place])   5. Morbid obesity, unspecified obesity type ([place])     PLAN:    In order of problems listed above:  Severe AS s/p TAVR: echo today shows EF 65%, normally functioning TAVR with a mean gradient of 12 mm hg and no PVL. She has NYHA class II symptoms, mostly related to COPD. Continue aspirin alone.  SBE prophylaxis discussed; I have RX'd amoxicillin. Continue regular follow up with Dr. Sallyanne Kuster   HTN: BP well controlled today. No changes made.    COPD: on home QHS 02.    Chronic diastolic CHF: appears euvolemic aside from some knee and LE edema, which I think is mostly from chronic venous stasis. She sees vascular soon   Morbid obesity: Body mass index is 38.69 kg/m. Limited mobility. Continue diet and exercise as tolerated.   Medication Adjustments/Labs and Tests Ordered: Current medicines are reviewed at length with the patient today.  Concerns regarding medicines are outlined above.  No orders of the defined types were placed in this encounter.  Meds ordered this encounter  Medications   amoxicillin (AMOXIL) 500 MG capsule    Sig: Take 4 capsules by mouth 1 hr before dental procedure    Dispense:  4 capsule    Refill:  3    Patient Instructions  Medication Instructions:  Your physician recommends that you continue on your current medications as directed. Please refer to the Current Medication list given to you today.  *If you need a refill on your cardiac medications before your next appointment, please call your pharmacy*   Lab Work: None If you have labs (blood work) drawn today and your tests are completely normal, you will receive your results only by: [place] (if you have MyChart) OR A paper copy in the mail If you have any lab test that is abnormal or we need to change your treatment, we will call you to review the results.   Follow-Up: At [facility], you and your health needs are our priority.  As part of our continuing mission to provide you with exceptional heart care, we have created designated Provider Care Teams.  These Care Teams include your primary Cardiologist (physician) and Advanced Practice Providers (APPs -  Physician Assistants and Nurse Practitioners) who all work together to provide you with the care you need, when you need  it.   Your next appointment:   6 month(s)  The format for your next appointment:   In Person  Provider:   Sanda Klein, MD {   Signed, Angelena Form, PA-C  08/01/2021 2:52 AM    [place]

## 2021-07-31 NOTE — Patient Instructions (Signed)
Medication Instructions:  Your physician recommends that you continue on your current medications as directed. Please refer to the Current Medication list given to you today.  *If you need a refill on your cardiac medications before your next appointment, please call your pharmacy*   Lab Work: None If you have labs (blood work) drawn today and your tests are completely normal, you will receive your results only by: [place] (if you have MyChart) OR A paper copy in the mail If you have any lab test that is abnormal or we need to change your treatment, we will call you to review the results.   Follow-Up: At [facility], you and your health needs are our priority.  As part of our continuing mission to provide you with exceptional heart care, we have created designated Provider Care Teams.  These Care Teams include your primary Cardiologist (physician) and Advanced Practice Providers (APPs -  Physician Assistants and Nurse Practitioners) who all work together to provide you with the care you need, when you need it.   Your next appointment:   6 month(s)  The format for your next appointment:   In Person  Provider:   Sanda Klein, MD {

## 2021-08-01 ENCOUNTER — Encounter: Payer: Self-pay | Admitting: Physician Assistant

## 2021-08-01 ENCOUNTER — Ambulatory Visit (HOSPITAL_COMMUNITY)
Admission: RE | Admit: 2021-08-01 | Discharge: 2021-08-01 | Disposition: A | Discharge status: Home / Self Care | Payer: Medicare HMO | Source: Ambulatory Visit | Attending: Vascular Surgery | Admitting: Vascular Surgery

## 2021-08-01 ENCOUNTER — Ambulatory Visit: Payer: Medicare HMO | Admitting: Physician Assistant

## 2021-08-01 VITALS — BP 151/75 | HR 75 | Temp 98.4°F | Resp 20 | Ht 64.0 in | Wt 225.5 lb

## 2021-08-01 DIAGNOSIS — M7989 Other specified soft tissue disorders: Secondary | ICD-10-CM

## 2021-08-01 DIAGNOSIS — I872 Venous insufficiency (chronic) (peripheral): Secondary | ICD-10-CM | POA: Diagnosis not present

## 2021-08-01 NOTE — Progress Notes (Signed)
Requested by:  [name], [address],  [place] [id number]  Reason for consultation: RLE venous insufficiency    History of Present Illness   Shannon Schultz is a 76 y.o. (1945/08/08) female who presents for evaluation of RLE venous insufficiency. She explains that she has had varicose veins since she was a teenager. Explains that they run in her family. She started to develop hyperpigmentation with venous dermatitis > 10 years ago. At one point she did have some episodes of cellulitis and was using zinc wraps at home. She currently is seeing Dermatologist for management of her venous dermatitis. She is using Clobetasol and the zinc wraps. She explains that this is helping. She says she also has eczema so the combination of the two has been very uncomfortable as far as itching. She additionally has had swelling for many years but over the past 1 year her right leg has remained swollen more than the left. She has been taking fluid pills which she reports has helped but her right leg still remains swollen more than the left. A lot of the swelling is more so around her right knee. This is not painful but she does experience tightness. She elevates her legs but due to bursitis in both her hips and her COPD requiring night time Oxygen she cannot lay flat so she is unable to elevate her legs above her heart. She has also tried knee high compression stockings in the past but they are hard to get on and with the increase in her RLE swelling she feels that they cut into her skin. She denies any prior vein procedures. She does have significant family history of venous disease. No hx of DVT.   Venous symptoms include:  itching, swelling Onset/duration:  1 year  Occupation:  retired Aggravating factors: sitting, standing Alleviating factors: elevation Compression:  yes Helps:  yes but she struggles to get them on Pain medications:  none Previous vein procedures:  none History of DVT:   no  Past Medical History:  Diagnosis Date   Arthritis    "hands" (05/15/2017)   Bladder cancer ([place]) 2018   Breast cancer, right ([place]) 1992   DCIS,bladder ca (just dx)   Colon polyp    Complication of anesthesia 1992   "local anesthesia" used was hard to awaken from-no problems since (05/15/2017)   COPD (chronic obstructive pulmonary disease) ([place])    Coronary artery disease    COVID-19 virus infection 05/05/2019   Diverticulosis    Elevated liver enzymes    fatty liver per ultrasound per pt   FHx: BRCA2 gene positive    sister with BRCA2 mutation (pt tested NEGATIVE)   HLD (hyperlipidemia)    HSV (herpes simplex virus) infection    on hip--on daily suppression   Hypertension    Hypothyroidism    took med 7 yrs after birth of 1st child   Impaired glucose tolerance    Migraine    On home oxygen therapy    "have it available but I'm not using it" (05/15/2017)   Osteopenia    S/P TAVR (transcatheter aortic valve replacement) 08/14/2020   s/p TAVR with a 26 mm Edwards S3U via the left subclavian approach by Dr. Roxy Manns and Dr. Angelena Form   Severe aortic stenosis    Sleep apnea    Sleeps with 2 L O2 @ night   Type 2 diabetes mellitus ([place])    Ventral hernia    Vitamin D deficiency disease  Past Surgical History:  Procedure Laterality Date   ABDOMINAL HERNIA REPAIR  05/15/2017   BREAST BIOPSY Right 1992   BREAST BIOPSY Left 2018   BREAST EXCISIONAL BIOPSY Left 2018   ATYPICAL DUCTAL HYPERPLASIA INVOLVING A COMPLEX   BREAST LUMPECTOMY WITH RADIOACTIVE SEED LOCALIZATION Left 12/22/2016   Procedure: LEFT BREAST LUMPECTOMY WITH RADIOACTIVE SEED LOCALIZATION;  Surgeon: Jovita Kussmaul, MD;  Location: [facility];  Service: General;  Laterality: Left;   CARDIAC CATHETERIZATION     CATARACT EXTRACTION, BILATERAL Bilateral R 03/16/2019 L 03/30/2019   Dr. Kathlen Mody   COLONOSCOPY  2009, 08/2010, 12/2015   Dr. Collene Mares; "only 1 had any polyps" (05/15/2017)   CYSTOSCOPY  04/23/2017   CYSTOSCOPY W/  RETROGRADES Bilateral 01/12/2017   Procedure: CYSTOSCOPY WITH RETROGRADE PYELOGRAM/ EXAM UNDER ANESTHESIA;  Surgeon: Raynelle Bring, MD;  Location: [facility];  Service: Urology;  Laterality: Bilateral;   EYE SURGERY     FEMUR IM NAIL Right 08/24/2019   Procedure: INTRAMEDULLARY (IM) NAIL FEMORAL;  Surgeon: Rod Can, MD;  Location: [facility];  Service: Orthopedics;  Laterality: Right;   HERNIA REPAIR     INSERTION OF MESH N/A 05/15/2017   Procedure: INSERTION OF MESH;  Surgeon: Jovita Kussmaul, MD;  Location: [facility];  Service: General;  Laterality: N/A;   LAPAROSCOPIC CHOLECYSTECTOMY  2003   MASTECTOMY Right 1992   RIGHT/LEFT HEART CATH AND CORONARY ANGIOGRAPHY N/A 07/05/2020   Procedure: RIGHT/LEFT HEART CATH AND CORONARY ANGIOGRAPHY;  Surgeon: Burnell Blanks, MD;  Location: [name];  Service: Cardiovascular;  Laterality: N/A;   TEE WITHOUT CARDIOVERSION N/A 08/14/2020   Procedure: TRANSESOPHAGEAL ECHOCARDIOGRAM (TEE);  Surgeon: Burnell Blanks, MD;  Location: [facility];  Service: Open Heart Surgery;  Laterality: N/A;   TRANSURETHRAL RESECTION OF BLADDER TUMOR WITH MITOMYCIN-C N/A 01/12/2017   Procedure: TRANSURETHRAL RESECTION OF BLADDER TUMOR WITH POSSIBLE POST OPERATIVE INSTILLATION OF MITOMYCIN-C;  Surgeon: Raynelle Bring, MD;  Location: [facility];  Service: Urology;  Laterality: N/A;   TYMPANOSTOMY TUBE PLACEMENT Bilateral 7591M   UMBILICAL HERNIA REPAIR  2003   VENTRAL HERNIA REPAIR N/A 05/15/2017   Procedure: VENTRAL HERNIA REPAIR WITH MESH;  Surgeon: Jovita Kussmaul, MD;  Location: [facility];  Service: General;  Laterality: N/A;    Social History   Socioeconomic History   Marital status: Married    Spouse name: Not on file   Number of children: 3   Years of education: Not on file   Highest education level: Not on file  Occupational History   Occupation: Retired  Tobacco Use   Smoking status: Former    Packs/day: 1.00    Years: 46.00    Pack years: 46.00    Types:  Cigarettes    Quit date: 05/31/2011    Years since quitting: 10.1   Smokeless tobacco: Never  Vaping Use   Vaping Use: Never used  Substance and Sexual Activity   Alcohol use: No   Drug use: No   Sexual activity: Yes    Partners: Male    Birth control/protection: Post-menopausal  Other Topics Concern   Not on file  Social History Narrative   Lives with her husband.  Children all live in [place] nearby. No pets. 6 grandchildren.   Social Determinants of Health   Financial Resource Strain: Low Risk    Difficulty of Paying Living Expenses: Not very hard  Food Insecurity: Not on file  Transportation Needs: No Transportation Needs   Lack of Transportation (Medical): No  Lack of Transportation (Non-Medical): No  Physical Activity: Not on file  Stress: Not on file  Social Connections: Not on file  Intimate Partner Violence: Not on file    Family History  Problem Relation Age of Onset   Heart disease Father    Diabetes Father    Hypertension Father    Asthma Sister    Allergies Sister    Hyperlipidemia Sister    Hashimoto's thyroiditis Sister    Fibromyalgia Sister    Heart disease Mother        tachycardia   Asthma Sister    Allergies Sister    Hyperlipidemia Sister    Hashimoto's thyroiditis Sister    Fibromyalgia Sister    Breast cancer Sister 63       BRCA2 positive; metastatic to bones at age 11, then spread to liver   Cancer Other        female cancers, bone cancer   Cancer Maternal Grandmother        deceased 29; unk. primary; possibly stomach   Tuberculosis Maternal Grandfather    Stroke Paternal Grandmother 18       died of cerebral hemorrhage    Current Outpatient Medications  Medication Sig Dispense Refill   acetaminophen (TYLENOL) 500 MG tablet Take 1,000 mg by mouth every 6 (six) hours as needed for moderate pain or headache.     albuterol (PROVENTIL) (2.5 MG/3ML) 0.083% nebulizer solution USE 1 VIAL BY NEBULIZATION EVERY FOUR HOURS AS NEEDED FOR WHEEZING  OR SHORTNESS OF BREATH. 30 mL 0   albuterol (VENTOLIN HFA) 108 (90 Base) MCG/ACT inhaler Inhale 2 puffs into the lungs every 6 (six) hours as needed for wheezing or shortness of breath. 3 each 3   Alcohol Swabs (B-D SINGLE USE SWABS REGULAR) PADS Use twice a day when checking blood sugars 100 each 1   amoxicillin (AMOXIL) 500 MG capsule Take 4 capsules by mouth 1 hr before dental procedure 4 capsule 3   aspirin EC 81 MG tablet Take 81 mg by mouth daily. Swallow whole.     bisoprolol-hydrochlorothiazide (ZIAC) 10-6.25 MG tablet TAKE 1 TABLET EVERY DAY 90 tablet 1   CALCIUM ACETATE-MAGNESIUM CARB PO Take 1 tablet by mouth daily.     cetirizine (ZYRTEC) 10 MG tablet Take 10 mg by mouth 2 (two) times daily.     furosemide (LASIX) 20 MG tablet Take 1 tablet (20 mg total) by mouth daily. 90 tablet 3   glucose blood test strip 1 each by Other route daily. Use as instructed 100 each 2   guaiFENesin (MUCINEX) 600 MG 12 hr tablet Take 1,200 mg by mouth daily.     Lancets MISC 1 each by Does not apply route daily. 100 each 2   metFORMIN (GLUCOPHAGE-XR) 500 MG 24 hr tablet Take 1 tablet (500 mg total) by mouth daily with breakfast. 1 tablet 0   montelukast (SINGULAIR) 10 MG tablet TAKE 1 TABLET AT BEDTIME 90 tablet 0   predniSONE (DELTASONE) 20 MG tablet Take 1 tablet (20 mg total) by mouth 2 (two) times daily with a meal. 10 tablet 0   Semaglutide,0.25 or 0.5MG/DOS, (OZEMPIC, 0.25 OR 0.5 MG/DOSE,) 2 MG/1.5ML SOPN Inject 0.25 mg into the skin once a week for 4 weeks then increase to 0.17m weekly for 8 weeks 6 mL 0   simvastatin (ZOCOR) 20 MG tablet TAKE 1 TABLET AT BEDTIME 90 tablet 0   triamcinolone cream (KENALOG) 0.1 % Apply 1 application topically as needed (rash or redness).  umeclidinium-vilanterol (ANORO ELLIPTA) 62.5-25 MCG/INH AEPB Inhale 1 puff into the lungs daily. 14 each 0   valACYclovir (VALTREX) 500 MG tablet Take 1 tablet (500 mg total) by mouth daily. 90 tablet 3   clobetasol ointment  (TEMOVATE) 0.05 % SMARTSIG:Sparingly Topical Daily     No current facility-administered medications for this visit.    Allergies  Allergen Reactions   Contrast Media [Iodinated Contrast Media] Anaphylaxis, Shortness Of Breath and Other (See Comments)    Could not breath   Iohexol Anaphylaxis, Shortness Of Breath and Other (See Comments)    Immediately could not breathe   Lisinopril Anaphylaxis, Shortness Of Breath and Rash   Sulfa Antibiotics Anaphylaxis and Other (See Comments)    Historical from mother, pt states that mother says she almost died from this drug   Latex Other (See Comments)    Unless against on skin for a long time, blisters. Short term is okay.   Nickel Other (See Comments)    With earrings pt has soreness and drainage from piercing   Codeine Nausea Only, Anxiety and Other (See Comments)    insomnia   Levofloxacin Other (See Comments)    insomnia   Lipitor [Atorvastatin Calcium] Rash   Meloxicam Rash    Broke out in a rash on her stomach,back, legs, and behind ears.   Vicodin [Hydrocodone-Acetaminophen] Itching    REVIEW OF SYSTEMS (negative unless checked):   Cardiac:  _0  Chest pain or chest pressure? _1  Shortness of breath upon activity? _2  Shortness of breath when lying flat? _3  Irregular heart rhythm?  Vascular:  _4  Pain in calf, thigh, or hip brought on by walking? _5  Pain in feet at night that wakes you up from your sleep? _6  Blood clot in your veins? _7  Leg swelling?  Pulmonary:  _8  Oxygen at home? _9  Productive cough? _10  Wheezing?  Neurologic:  _11  Sudden weakness in arms or legs? _12  Sudden numbness in arms or legs? _13  Sudden onset of difficult speaking or slurred speech? _14  Temporary loss of vision in one eye? _15  Problems with dizziness?  Gastrointestinal:  _16  Blood in stool? _17  Vomited blood?  Genitourinary:  _18  Burning when urinating? _19  Blood in urine?  Psychiatric:  _20  Major depression  Hematologic:  _21  Bleeding  problems? _22  Problems with blood clotting?  Dermatologic:  _23  Rashes or ulcers?  Constitutional:  _24  Fever or chills?  Ear/Nose/Throat:  _25  Change in hearing? _26  Nose bleeds? _27  Sore throat?  Musculoskeletal:  _28  Back pain? _29  Joint pain? _30  Muscle pain?   Physical Examination     Vitals:   08/01/21 1427  BP: (!) 151/75  Pulse: 75  Resp: 20  Temp: 98.4 F (36.9 C)  TempSrc: Temporal  SpO2: 97%  Weight: 225 lb 8 oz (102.3 kg)  Height: _31  (1.626 m)   Body mass index is 38.71 kg/m.  General:  WDWN in NAD; vital signs documented above Gait: Not observed HENT: WNL, normocephalic Pulmonary: normal non-labored breathing , without wheezing Cardiac: regular HR, without  Murmurs, without carotid bruit Abdomen: soft, NT, no masses Vascular Exam/Pulses:2+ radial pulses bilaterally, 2+ DP pulses bilaterally. Feet warm and well perfused Extremities: with varicose veins, with reticular veins, with edema, with stasis pigmentation, without lipodermatosclerosis, without ulcers   Musculoskeletal: no muscle wasting or atrophy  Neurologic: A&O X 3;  No focal weakness or paresthesias are detected Psychiatric:  The pt has Normal affect.  Non-invasive Vascular Imaging   BLE Venous Insufficiency Duplex (08/01/21):  RLE:  DVT and  SVT GSV reflux SFJ to distal thigh GSV diameter 0.431-0.56 cm SSV reflux mid calf No deep venous reflux   Medical Decision Making   CRISTA NUON is a 76 y.o. female who presents with: RLE chronic venous insufficiency with swelling and venous skin changes(CEAP class C4). Her duplex today shows  no DVT or SVT. She does not have any deep reflux. She does have reflux in her GSV throughout thigh and small area in her SSV in the mid calf. Her veins are of adequate size to be considered for venous ablation.  Based on the patient's history and examination, I recommend: daily elevation as high as she can tolerate, thigh high compression stockings, exercise,  and refraining from prolonged sitting or standing. I discussed with the patient the use of her 20-30 mm thigh high compression stockings and need for 3 month trial of such. The patient will follow up in 3 months with MD for further evaluation for possible lazer ablation    Karoline Caldwell, PA-C [facility]: [phone]  08/01/2021, 3:12 PM  Clinic MD: Scot Dock

## 2021-08-06 ENCOUNTER — Telehealth: Payer: Self-pay

## 2021-08-06 DIAGNOSIS — L309 Dermatitis, unspecified: Secondary | ICD-10-CM | POA: Diagnosis not present

## 2021-08-06 DIAGNOSIS — I872 Venous insufficiency (chronic) (peripheral): Secondary | ICD-10-CM | POA: Diagnosis not present

## 2021-08-06 NOTE — Telephone Encounter (Signed)
Pt called and states she still hasn't gotten her Pt Assistance Ozempic, she called [employer] and recording tells her shipments are behind.  I called and was on hold for almost 2 hours and still couldn't reach anyone.  Patient would like 1 month Ozempic called into local pharmacy [facility]  To hold her until Pt Assistance comes in.

## 2021-08-06 NOTE — Telephone Encounter (Signed)
Ok for rx?

## 2021-08-07 ENCOUNTER — Other Ambulatory Visit: Payer: Self-pay | Admitting: *Deleted

## 2021-08-07 ENCOUNTER — Other Ambulatory Visit (HOSPITAL_COMMUNITY): Payer: Self-pay

## 2021-08-07 NOTE — Telephone Encounter (Signed)
[facility] did not have any Ozempic, patient had a refill left from [facility] in the past she called and got that.

## 2021-08-12 ENCOUNTER — Telehealth: Payer: Self-pay | Admitting: Pulmonary Disease

## 2021-08-12 ENCOUNTER — Ambulatory Visit: Payer: Medicare HMO | Admitting: Family Medicine

## 2021-08-12 MED ORDER — UMECLIDINIUM-VILANTEROL 62.5-25 MCG/ACT IN AEPB: 1.0000 | INHALATION_SPRAY | Freq: Every day | RESPIRATORY_TRACT | 3 refills | Status: DC

## 2021-08-12 NOTE — Telephone Encounter (Signed)
Rx for pt's anoro inhaler has been sent to preferred pharmacy for pt. Called and spoke with pt letting her know this had been done and she verbalized understanding. Nothing further needed.

## 2021-08-14 NOTE — Progress Notes (Signed)
Chief Complaint  Patient presents with   Diabetes    3 month follow up. No concerns. Needs RF on Valtrex.    Patient presents for 3 month follow-up on diabetes. At her physical in 04/2021 she was started on Ozempic--to improve control of sugars, and also help with weight loss.  Metformin dose was decreased to 500mg  once daily.  She was started at 0.25mg  weekly x 4 weeks, then dose was increased to 0.5mg . She is tolerating 0.5mg  without side effects.  She took 3 courses of prednisone from derm since last visit, for her eczema, sugars were very high while on the steroids.  Last A1c was 6.9% prior to the med change. Occasional mild nausea. Slight decrease in appetite noticed.  Sugars are running 130's in the morning.  Not checking other times of day.  She recently saw cardiology for f/u (1 year s/p TAVR).  She had echo--normal EF, normally functioning TAVR.  Advised to continue aspirin, and prescribed amoxil for SBE prophylaxis.  She saw vascular surgery recently, had US performed.  Recommended elevation, thigh high compression (20-30 mm), and to f/u in 3 mos (to consider laser treatment at that time). She has been wearing these, swelling has improved.  She is taking the lasix prn, when she notes swelling, rather than using it daily.  Needs valtrex refilled.  Takes it daily.  Occasionally gets an itch at her L hip (used to be on the right), but never gets a sore.     PMH, PSH, SH reviewed  Outpatient Encounter Medications as of 08/15/2021  Medication Sig Note   Alcohol Swabs (B-D SINGLE USE SWABS REGULAR) PADS Use twice a day when checking blood sugars    aspirin EC 81 MG tablet Take 81 mg by mouth daily. Swallow whole.    bisoprolol-hydrochlorothiazide (ZIAC) 10-6.25 MG tablet TAKE 1 TABLET EVERY DAY    CALCIUM ACETATE-MAGNESIUM CARB PO Take 1 tablet by mouth daily. 04/04/2021: With zinc and vitamin D   cetirizine (ZYRTEC) 10 MG tablet Take 10 mg by mouth daily.    clobetasol ointment  (TEMOVATE) 0.05 % SMARTSIG:Sparingly Topical Daily    glucose blood test strip 1 each by Other route daily. Use as instructed    guaiFENesin (MUCINEX) 600 MG 12 hr tablet Take 1,200 mg by mouth daily.    Lancets MISC 1 each by Does not apply route daily.    metFORMIN (GLUCOPHAGE-XR) 500 MG 24 hr tablet Take 1 tablet (500 mg total) by mouth daily with breakfast.    montelukast (SINGULAIR) 10 MG tablet TAKE 1 TABLET AT BEDTIME    Semaglutide,0.25 or 0.5MG /DOS, (OZEMPIC, 0.25 OR 0.5 MG/DOSE,) 2 MG/1.5ML SOPN Inject 0.25 mg into the skin once a week for 4 weeks then increase to 0.5mg  weekly for 8 weeks 06/17/2021: .05mg  weekly   simvastatin (ZOCOR) 20 MG tablet TAKE 1 TABLET AT BEDTIME    triamcinolone cream (KENALOG) 0.1 % Apply 1 application topically as needed (rash or redness).    umeclidinium-vilanterol (ANORO ELLIPTA) 62.5-25 MCG/ACT AEPB Inhale 1 puff into the lungs daily.    [DISCONTINUED] valACYclovir (VALTREX) 500 MG tablet Take 1 tablet (500 mg total) by mouth daily.    acetaminophen (TYLENOL) 500 MG tablet Take 1,000 mg by mouth every 6 (six) hours as needed for moderate pain or headache. (Patient not taking: Reported on 08/15/2021) 05/08/2021: Takes as needed    albuterol (PROVENTIL) (2.5 MG/3ML) 0.083% nebulizer solution USE 1 VIAL BY NEBULIZATION EVERY FOUR HOURS AS NEEDED FOR WHEEZING OR SHORTNESS  OF BREATH. (Patient not taking: Reported on 08/15/2021) 08/15/2021: As needed   albuterol (VENTOLIN HFA) 108 (90 Base) MCG/ACT inhaler Inhale 2 puffs into the lungs every 6 (six) hours as needed for wheezing or shortness of breath. (Patient not taking: Reported on 08/15/2021) 08/15/2021: As needed   amoxicillin (AMOXIL) 500 MG capsule Take 4 capsules by mouth 1 hr before dental procedure (Patient not taking: Reported on 08/15/2021)    furosemide (LASIX) 20 MG tablet Take 1 tablet (20 mg total) by mouth daily. (Patient not taking: Reported on 08/15/2021) 05/08/2021: Does not take when she has to leave the  house.   valACYclovir (VALTREX) 500 MG tablet Take 1 tablet (500 mg total) by mouth daily.    [DISCONTINUED] predniSONE (DELTASONE) 20 MG tablet Take 1 tablet (20 mg total) by mouth 2 (two) times daily with a meal.    No facility-administered encounter medications on file as of 08/15/2021.   Allergies  Allergen Reactions   Contrast Media [Iodinated Contrast Media] Anaphylaxis, Shortness Of Breath and Other (See Comments)    Could not breath   Iohexol Anaphylaxis, Shortness Of Breath and Other (See Comments)    Immediately could not breathe   Lisinopril Anaphylaxis, Shortness Of Breath and Rash   Sulfa Antibiotics Anaphylaxis and Other (See Comments)    Historical from mother, pt states that mother says she almost died from this drug   Latex Other (See Comments)    Unless against on skin for a long time, blisters. Short term is okay.   Nickel Other (See Comments)    With earrings pt has soreness and drainage from piercing   Codeine Nausea Only, Anxiety and Other (See Comments)    insomnia   Levofloxacin Other (See Comments)    insomnia   Lipitor [Atorvastatin Calcium] Rash   Meloxicam Rash    Broke out in a rash on her stomach,back, legs, and behind ears.   Vicodin [Hydrocodone-Acetaminophen] Itching     PHYSICAL EXAM:  BP 120/70    Pulse 60    Ht 5\' 4"  (1.626 m)    Wt 226 lb 3.2 oz (102.6 kg)    LMP  (LMP Unknown)    BMI 38.83 kg/m   Wt Readings from Last 3 Encounters:  08/15/21 226 lb 3.2 oz (102.6 kg)  08/01/21 225 lb 8 oz (102.3 kg)  07/31/21 225 lb 6.4 oz (102.2 kg)   Weight at CPE 05/08/21 was 231# 12.8 oz  Well-appearing, pleasant female, in no distress HEENT: conjunctiva and sclera are clear, EOMI, wearing mask Neck: no lymphadenopathy, thyromegakly or mass, no bruit Back: no spinal or CVA tenderness Abodmen: obese, soft, nontender, no mass Extremities: Trace edema pretibially.  Skin is soft, hyperpigmented. Not woody, thickened or indurated Psych: normal mood,  affect, hygiene and grooming Neuro: alert and oriented, normal gait.  Lab Results  Component Value Date   HGBA1C 6.2 (A) 08/15/2021     ASSESSMENT/PLAN:  Type 2 diabetes mellitus with complications ([place]) - complics--HTN, DM. Controlled/improved. Cont ozempic, Metformin. Declined option to increase ozempic and stop metformin - Plan: HgB A1c  Venous insufficiency of right lower extremity - cont leg elevation, compression, per vascular.  Plans to f/u for injections. Cont low Na diet  HSV (herpes simplex virus) infection - doing well on preventative med, cont - Plan: valACYclovir (VALTREX) 500 MG tablet  Declines change in medications. Briefly discussed increasing ozempic to 1mg  and stopping metformin. Prefers to continue same regimen until done dealing with her veins.  F/u at  med check in June, [place] for 04/2022 Med check before then--June?

## 2021-08-15 ENCOUNTER — Other Ambulatory Visit: Payer: Self-pay

## 2021-08-15 ENCOUNTER — Encounter: Payer: Self-pay | Admitting: Family Medicine

## 2021-08-15 ENCOUNTER — Telehealth: Payer: Self-pay | Admitting: Family Medicine

## 2021-08-15 ENCOUNTER — Ambulatory Visit (INDEPENDENT_AMBULATORY_CARE_PROVIDER_SITE_OTHER): Payer: Medicare HMO | Admitting: Family Medicine

## 2021-08-15 VITALS — BP 120/70 | HR 60 | Ht 64.0 in | Wt 226.2 lb

## 2021-08-15 DIAGNOSIS — E669 Obesity, unspecified: Secondary | ICD-10-CM

## 2021-08-15 DIAGNOSIS — I872 Venous insufficiency (chronic) (peripheral): Secondary | ICD-10-CM

## 2021-08-15 DIAGNOSIS — E1169 Type 2 diabetes mellitus with other specified complication: Secondary | ICD-10-CM

## 2021-08-15 DIAGNOSIS — K7460 Unspecified cirrhosis of liver: Secondary | ICD-10-CM

## 2021-08-15 DIAGNOSIS — D696 Thrombocytopenia, unspecified: Secondary | ICD-10-CM

## 2021-08-15 DIAGNOSIS — E785 Hyperlipidemia, unspecified: Secondary | ICD-10-CM

## 2021-08-15 DIAGNOSIS — B009 Herpesviral infection, unspecified: Secondary | ICD-10-CM

## 2021-08-15 DIAGNOSIS — E118 Type 2 diabetes mellitus with unspecified complications: Secondary | ICD-10-CM

## 2021-08-15 DIAGNOSIS — Z5181 Encounter for therapeutic drug level monitoring: Secondary | ICD-10-CM

## 2021-08-15 LAB — POCT GLYCOSYLATED HEMOGLOBIN (HGB A1C): Hemoglobin A1C: 6.2 % — AB (ref 4.0–5.6)

## 2021-08-15 MED ORDER — VALACYCLOVIR HCL 500 MG PO TABS: 500.0000 mg | ORAL_TABLET | Freq: Every day | ORAL | 3 refills | Status: DC

## 2021-08-15 NOTE — Telephone Encounter (Signed)
Actually, a lot of her "yearly" labs were last done in April.  So, I'd like for her to come for fasting labs prior to her June appointment. Please schedule that.  I've entered orders for CBC, c-met, lipid, urine microalb and A1c. Depending on those results, she might not need anything at her physical, will have to wait and see results, and can order at her June visit.

## 2021-08-15 NOTE — Telephone Encounter (Signed)
Pt called and states that when she checked out she scheduled her next mwv for November. She called back to schedule labs prior. Please place orders and I will call pt back to schedule.

## 2021-08-15 NOTE — Patient Instructions (Signed)
Continue the current dose of ozempic and 1 metformin daily. If you change your mind, just let us know, and we can stop the metformin and increase the ozempic to 1mg  dose.  Continue to work on diet, exercise and weight loss.

## 2021-08-16 ENCOUNTER — Encounter: Payer: Self-pay | Admitting: Family Medicine

## 2021-08-16 NOTE — Telephone Encounter (Signed)
Pt called, advised and lab appt made.

## 2021-08-16 NOTE — Telephone Encounter (Signed)
[facility] t# [phone], Pt is approved til 06/29/22, takes about 30 days to ship.  Then asked for voucher and was approved for 30 day voucher sending to my email to be filled at local pharmacy

## 2021-08-20 ENCOUNTER — Telehealth: Payer: Self-pay | Admitting: Pulmonary Disease

## 2021-08-20 ENCOUNTER — Other Ambulatory Visit (HOSPITAL_COMMUNITY): Payer: Self-pay

## 2021-08-20 MED ORDER — UMECLIDINIUM-VILANTEROL 62.5-25 MCG/ACT IN AEPB: 1.0000 | INHALATION_SPRAY | Freq: Every day | RESPIRATORY_TRACT | 0 refills | Status: DC

## 2021-08-20 NOTE — Telephone Encounter (Signed)
PT calling again in regards to her anoro ellipta. States she spoke with [facility] and they need a prior auth for this. They faxed [place] on 2/16 and have not heard back. Pt only has 8 more days left which will not last her until it arrives. Would like samples to get her through until delivery. Routing to both prior auth team and triage. Please call and update pt when we can.

## 2021-08-20 NOTE — Telephone Encounter (Signed)
Patient is aware of below message and voiced her understanding. Anoro sent to local pharmacy per patient request.  Nothing further needed at this time.

## 2021-08-21 ENCOUNTER — Other Ambulatory Visit (HOSPITAL_COMMUNITY): Payer: Self-pay

## 2021-08-22 IMAGING — CT CT CTA ABD/PEL W/CM AND/OR W/O CM
1 of 3 series · 1 of 21 positions shown · non-contrast
Comparison: CTA of the chest, abdomen and pelvis 07/13/2020.

CLINICAL DATA: 74-year-old female with history of severe aortic
stenosis under preoperative evaluation prior to potential
transcatheter aortic valve replacement (TAVR) procedure.

EXAM:
CT ANGIOGRAPHY CHEST, ABDOMEN AND PELVIS
TECHNIQUE: Non-contrast CT of the chest was initially obtained.

[Series 200: — · 0.35mm/px · 1 of 7 slices shown]
[im 4/7]
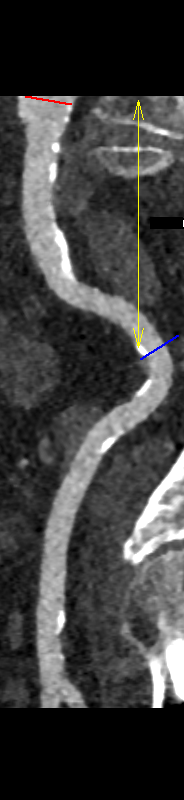

[1 of 21 positions shown; findings below may reference images not displayed]

Multidetector CT imaging through the chest, abdomen and pelvis was
performed using the standard protocol during bolus administration of
intravenous contrast. Multiplanar reconstructed images and MIPs were
obtained and reviewed to evaluate the vascular anatomy.

CONTRAST:  80mL OMNIPAQUE IOHEXOL 350 MG/ML SOLN
FINDINGS: CTA CHEST FINDINGS

Cardiovascular: Heart size is normal. There is no significant
pericardial fluid, thickening or pericardial calcification. There is
aortic atherosclerosis, as well as atherosclerosis of the great
vessels of the mediastinum and the coronary arteries, including
calcified atherosclerotic plaque in the left main, left anterior
descending, left circumflex and right coronary arteries. Thickening
and calcification of the aortic valve. Mild to moderate
calcifications of the mitral annulus.

Mediastinum/Lymph Nodes: No pathologically enlarged mediastinal or
hilar lymph nodes. Esophagus is unremarkable in appearance. No
axillary lymphadenopathy. Numerous surgical clips are noted in the
right axillary region.

Lungs/Pleura: No suspicious appearing pulmonary nodules or masses
are noted. No acute consolidative airspace disease. No pleural
effusions. Diffuse bronchial wall thickening with mild centrilobular
and paraseptal emphysema.

Musculoskeletal/Soft Tissues: Status post right modified radical
mastectomy and right axillary lymph node dissection. There are no
aggressive appearing lytic or blastic lesions noted in the
visualized portions of the skeleton.

CTA ABDOMEN AND PELVIS FINDINGS

Hepatobiliary: Liver has a shrunken appearance and nodular contour,
indicative of underlying cirrhosis. No discrete cystic or solid
hepatic lesions. No intra or extrahepatic biliary ductal dilatation.
Status post cholecystectomy.

Pancreas: No pancreatic mass. No pancreatic ductal dilatation. No
pancreatic or peripancreatic fluid collections or inflammatory
changes.

Spleen: Spleen is enlarged measuring 12.8 x 7.7 x 16.9 cm (estimated
splenic volume of 833 mL) .

Adrenals/Urinary Tract: Bilateral kidneys and adrenal glands are
normal in appearance. No hydroureteronephrosis. Urinary bladder is
normal in appearance.

Stomach/Bowel: Normal appearance of the stomach. No pathologic
dilatation of small bowel or colon. Numerous colonic diverticulae
are noted, most evident in the sigmoid colon, without surrounding
inflammatory changes to suggest an acute diverticulitis at this
time. Normal appendix.

Vascular/Lymphatic: Aortic atherosclerosis, with vascular findings
and measurements pertinent to potential TAVR procedure, as detailed
below. No lymphadenopathy noted in the abdomen or pelvis.

Reproductive: Uterus and ovaries are atrophic.

Other: No significant volume of ascites.  No pneumoperitoneum.

Musculoskeletal: Status post ORIF in the proximal right femur. There
are no aggressive appearing lytic or blastic lesions noted in the
visualized portions of the skeleton.

VASCULAR MEASUREMENTS PERTINENT TO TAVR:

AORTA:

Minimal Aortic 3iameter-UA x 13 mm

Severity of Aortic Calcification-severe

RIGHT PELVIS:

Right Common Iliac Artery -

Minimal Xiameter-W.G x 7.1 mm

Tortuosity-mild

Calcification-severe

Right External Iliac Artery -

Minimal Iiameter-S.D x 6.3 mm

Tortuosity-moderate

Calcification-moderate to severe

Right Common Femoral Artery -

Minimal 1iameter-O.M x 7.8 mm

Tortuosity-mild

Calcification-moderate

LEFT PELVIS:

Left Common Iliac Artery -

Minimal Iiameter-7.W x 5.4 mm

Tortuosity-mild

Calcification-severe

Left External Iliac Artery -

Minimal Liameter-B.3 x 5.2 mm

Tortuosity-moderate

Calcification-moderate to severe

Left Common Femoral Artery -

Minimal Iiameter-S.D x 4.0 mm

Tortuosity-mild

Calcification-moderate

Review of the MIP images confirms the above findings.
IMPRESSION: 1. Vascular findings and measurements pertinent to potential TAVR
procedure, as detailed above.
2. Severe thickening calcification of the aortic valve, compatible
with reported clinical history of severe aortic stenosis.
3. Cirrhosis and splenomegaly.
4. Colonic diverticulosis without evidence of acute diverticulitis
at this time.
5. Additional incidental findings, as above, similar to the prior
study.

## 2021-08-26 DIAGNOSIS — H26492 Other secondary cataract, left eye: Secondary | ICD-10-CM | POA: Diagnosis not present

## 2021-08-27 MED ORDER — OZEMPIC (0.25 OR 0.5 MG/DOSE) 2 MG/1.5ML ~~LOC~~ SOPN: 0.5000 mg | PEN_INJECTOR | SUBCUTANEOUS | 0 refills | Status: DC

## 2021-08-27 NOTE — Telephone Encounter (Signed)
Called pt and she only has 1 more week of Ozempic left, advised will send into local [facility] for 1 month free voucher while waiting on Pt Assistance meds to come in

## 2021-09-02 ENCOUNTER — Ambulatory Visit: Payer: Medicare HMO | Admitting: Pulmonary Disease

## 2021-09-02 ENCOUNTER — Encounter: Payer: Self-pay | Admitting: Pulmonary Disease

## 2021-09-02 ENCOUNTER — Other Ambulatory Visit: Payer: Self-pay

## 2021-09-02 VITALS — BP 124/60 | HR 81 | Ht 63.5 in | Wt 229.6 lb

## 2021-09-02 DIAGNOSIS — J309 Allergic rhinitis, unspecified: Secondary | ICD-10-CM | POA: Diagnosis not present

## 2021-09-02 DIAGNOSIS — G4734 Idiopathic sleep related nonobstructive alveolar hypoventilation: Secondary | ICD-10-CM

## 2021-09-02 DIAGNOSIS — J449 Chronic obstructive pulmonary disease, unspecified: Secondary | ICD-10-CM | POA: Diagnosis not present

## 2021-09-02 MED ORDER — MONTELUKAST SODIUM 10 MG PO TABS: 10.0000 mg | ORAL_TABLET | Freq: Every day | ORAL | 3 refills | Status: DC

## 2021-09-02 MED ORDER — UMECLIDINIUM-VILANTEROL 62.5-25 MCG/ACT IN AEPB: 1.0000 | INHALATION_SPRAY | Freq: Every day | RESPIRATORY_TRACT | 1 refills | Status: DC

## 2021-09-02 NOTE — Patient Instructions (Addendum)
Moderate COPD GOLD A/B ?--CONTINUE Anoro ONE puff ONCE a day. REFILL one year ?--CONTINUE Albuterol AS NEEDED for shortness of breath or wheezing ? ?Allergic Rhinitis ?--CONTINUE Zyrtec ?--CONTINUE Singulair. REFILL ?--STOP Flonase due to nose bleeds. Call office for atrovent nasal spray if congestion recurs ? ?Nocturnal hypoxemic respiratory failure secondary to mild OSA  ?--CONTINUE 2L O2 oxygen via nasal cannula nightly  ?--ORDER overnight night oximetry to determine if she still requires it ? ?Follow-up with me in one year ?

## 2021-09-02 NOTE — Progress Notes (Signed)
Subjective:   PATIENT ID: Shannon Schultz GENDER: female DOB: [birth year], MRN: [id number]   HPI  Chief Complaint  Patient presents with   Follow-up    Cough, congestion, could be allergies   [name] is a 76 year old female with allergic rhinitis moderate COPD, HTN, DM2, AS s/p TAVR 07/2020 who presents for follow-up.  Synopsis: Diagnosis of COPD well-controlled on Anoro. Last COPD flare requiring antibiotics 05/2021.  09/02/2021 Since her last visit she has been compliant with her Anoro and Singulair daily.  She was treated with azithromycin for COPD flare/acute bronchitis in December 2022.  Her last COPD exacerbation for this was 2021. She currently reports mild cough with nasal congestion. Trig[place] pollen and [place] She is on zyrtec and Singulair. Otherwise she reports her symptoms are well-controlled. Denies wheezing. No limitations in activity. Compliant with oxygen at night however would want to discontinue this if she no longer needs it.  Social History: 46 pack years. Quit in 2012. During 2018, she lost her sister to breast cancer in early 2018, was diagnosed with breast cancer s/p lumpectomy, had bladder cancer and underwent resection and intravesicular chemo, fell and broke her right shoulder, underwent ventral hernia repair with mesh insertion.   Patient Active Problem List   Diagnosis Date Noted   Acute bronchitis with COPD ([place]) 06/05/2021   Nocturnal hypoxemia 11/12/2020   S/P TAVR (transcatheter aortic valve replacement) 08/14/2020   Severe aortic stenosis    Contrast media allergy 06/26/2020   AAA (abdominal aortic aneurysm) without rupture 06/26/2020   Upper airway cough syndrome 12/20/2019   Hip fracture ([place]) 08/25/2019   Venous insufficiency 09/10/2018   Sinusitis 06/25/2018   Ophthalmic migraine 03/16/2018   COPD, group B, by GOLD 2017 classification ([place]) 09/30/2017   Chronic hypoxemic respiratory failure ([place]) 09/29/2017    Ventral hernia without obstruction or gangrene 05/15/2017   Cirrhosis of liver without ascites ([place]) 03/11/2017   Urothelial cancer ([place]) 03/10/2017   Atherosclerosis of coronary artery 09/14/2016   Aortic atherosclerosis ([place]) 09/14/2016   Class 2 severe obesity due to excess calories with serious comorbidity and body mass index (BMI) of 39.0 to 39.9 in adult ([place]) 11/10/2015   Thrombocytopenia ([place]) 08/17/2015   Elevated troponin 08/17/2015   Diabetes mellitus type 2 in obese ([place]) 08/17/2015   Severe obesity (BMI >= 40) ([place]) 10/06/2014   FHx: BRCA2 gene positive    HSV (herpes simplex virus) infection 06/07/2012   Pure hypercholesterolemia 11/13/2010   Essential hypertension, benign 11/13/2010    Outpatient Medications Prior to Visit  Medication Sig Dispense Refill   Alcohol Swabs (B-D SINGLE USE SWABS REGULAR) PADS Use twice a day when checking blood sugars 100 each 1   aspirin EC 81 MG tablet Take 81 mg by mouth daily. Swallow whole.     bisoprolol-hydrochlorothiazide (ZIAC) 10-6.25 MG tablet TAKE 1 TABLET EVERY DAY 90 tablet 1   CALCIUM ACETATE-MAGNESIUM CARB PO Take 1 tablet by mouth daily.     cetirizine (ZYRTEC) 10 MG tablet Take 10 mg by mouth daily.     clobetasol ointment (TEMOVATE) 0.05 % SMARTSIG:Sparingly Topical Daily     glucose blood test strip 1 each by Other route daily. Use as instructed 100 each 2   guaiFENesin (MUCINEX) 600 MG 12 hr tablet Take 1,200 mg by mouth daily.     Lancets MISC 1 each by Does not apply route daily. 100 each 2   metFORMIN (GLUCOPHAGE-XR) 500 MG  MG 24 hr tablet Take 1 tablet (500 mg total) by mouth daily with breakfast. 1 tablet 0   montelukast (SINGULAIR) 10 MG tablet TAKE 1 TABLET AT BEDTIME 90 tablet 0   Semaglutide,0.25 or 0.5MG/DOS, (OZEMPIC, 0.25 OR 0.5 MG/DOSE,) 2 MG/1.5ML SOPN Inject 0.5 mg into the skin once a week. 6 mL 0   simvastatin (ZOCOR) 20 MG tablet TAKE 1 TABLET AT BEDTIME 90 tablet 0   triamcinolone cream (KENALOG) 0.1 %  Apply 1 application topically as needed (rash or redness).     umeclidinium-vilanterol (ANORO ELLIPTA) 62.5-25 MCG/ACT AEPB Inhale 1 puff into the lungs daily. 180 each 0   valACYclovir (VALTREX) 500 MG tablet Take 1 tablet (500 mg total) by mouth daily. 90 tablet 3   acetaminophen (TYLENOL) 500 MG tablet Take 1,000 mg by mouth every 6 (six) hours as needed for moderate pain or headache. (Patient not taking: Reported on 08/15/2021)     albuterol (PROVENTIL) (2.5 MG/3ML) 0.083% nebulizer solution USE 1 VIAL BY NEBULIZATION EVERY FOUR HOURS AS NEEDED FOR WHEEZING OR SHORTNESS OF BREATH. (Patient not taking: Reported on 08/15/2021) 30 mL 0   albuterol (VENTOLIN HFA) 108 (90 Base) MCG/ACT inhaler Inhale 2 puffs into the lungs every 6 (six) hours as needed for wheezing or shortness of breath. (Patient not taking: Reported on 08/15/2021) 3 each 3   amoxicillin (AMOXIL) 500 MG capsule Take 4 capsules by mouth 1 hr before dental procedure 4 capsule 3   furosemide (LASIX) 20 MG tablet Take 1 tablet (20 mg total) by mouth daily. (Patient not taking: Reported on 08/15/2021) 90 tablet 3   No facility-administered medications prior to visit.    Review of Systems  Constitutional:  Negative for chills, diaphoresis, fever, malaise/fatigue and weight loss.  HENT:  Positive for congestion.   Respiratory:  Positive for cough. Negative for hemoptysis, sputum production, shortness of breath and wheezing.   Cardiovascular:  Negative for chest pain, palpitations and leg swelling.   Objective:   Vitals:   09/02/21 1011  BP: 124/60  Pulse: 81  SpO2: 94%  Weight: 229 lb 9.6 oz (104.1 kg)  Height: 5' 3.5" (1.613 m)   SpO2: 94 % O2 Device: None (Room air)   Body mass index is 40.03 kg/m.  Physical Exam: General: Well-appearing, no acute distress HENT: [place], AT Eyes: EOMI, no scleral icterus Respiratory: Clear to auscultation bilaterally.  No crackles, wheezing or rales Cardiovascular: RRR, -M/R/G, no  JVD Extremities:-Edema,-tenderness Neuro: AAO x4, CNII-XII grossly intact Psych: Normal mood, normal affect  Data Reviewed:  Imaging Screening CT chest 09/10/16 -moderate emphysema Screening CT 09/16/17-moderate emphysema, scattered subcentimeter pulmonary nodules.  Fatty liver with findings of cirrhosis. Screening CT 11/29/18- interval development of RUL nodule measuring 6.1 mm CT Chest 03/03/19 - slightly decreased RUL nodule measuring 5.3 mm Screening CT chest 03/26/20 - centrilobular emphysema. Scattered tiny pulmonary nodules, stable CTA 07/13/20 Stable pulmonary nodules VQ scan 04/02/2021-no pulmonary embolism  PFTs 08/28/11 FVC 2.71 [92%], FEV1 1.33 (62%), F/F 49, TLC 114%, DLCO 46% Moderate obstructive defect with moderate reduction in diffusion capacity. No bronchodilator response   11/08/15 FVC 2.05 [7%), FEV1 1.23 (55%), F/F 60 Moderate obstructive defect   09/24/16 FVC 2. (82%), FEV1 1.43 (62%), F/F 58 , TLC 99%, DLCO 54% Moderate obstruction with moderate diffusion defect. No bronchodilator response.   Labs A1AT 08/08/16- 162, PIMM  Sleep study Home sleep study 05/03/19 - AHI 8.8 Nadir SpO2 76%     Assessment & Plan:  76 year old female with  rhinitis, moderate COPD, OSA not on CPAP, hypertension, diabetes, aortic stenosis status post TAVR 07/2020 who presents for follow-up  Moderate COPD GOLD B - last exacerbation 05/2021. Currently well-controlled --CONTINUE Anoro ONE puff ONCE a day. REFILL one year --CONTINUE Albuterol AS NEEDED for shortness of breath or wheezing  Allergic Rhinitis --CONTINUE Zyrtec --CONTINUE Singulair. REFILL --STOP Flonase due to nose bleeds. Call office for atrovent nasal spray if congestion recurs   Nocturnal hypoxemic respiratory failure secondary to mild OSA  Discussed CPAP vs oral appliance vs weight loss. Prefers to continue oxygen. No symptoms of fatigue, shortness of breath, headaches. Overall asymptomatic. --CONTINUE 2L O2  oxygen via nasal cannula nightly  --ORDER overnight night oximetry to determine if she still requires it   Health maintenance  Immunization History  Administered Date(s) Administered   Fluad Quad(high Dose 65+) 04/07/2019, 04/26/2020, 05/08/2021   Influenza Split 05/28/2011   Influenza, High Dose Seasonal PF 04/07/2013, 05/15/2014, 05/30/2015, 03/25/2016, 03/05/2017, 04/01/2018   Influenza, Seasonal, Injecte, Preservative Fre 06/07/2012   PFIZER Comirnaty(Gray Top)Covid-19 Tri-Sucrose Vaccine 10/25/2020   PFIZER(Purple Top)SARS-COV-2 Vaccination 08/20/2019, 09/13/2019, 04/14/2020   Pfizer Covid-19 Vaccine Bivalent Booster 43yr & up 05/08/2021   Pneumococcal Conjugate-13 07/06/2014   Pneumococcal Polysaccharide-23 12/15/2004, 05/28/2011, 05/27/2021   Tdap 02/23/2008, 06/15/2018   Zoster Recombinat (Shingrix) 12/02/2017, 02/06/2018   Zoster, Live 05/17/2010   Annual CT Lung -scheduled for 08/2021  Orders Placed This Encounter  Procedures   Pulse oximetry, overnight    Standing Status:   Future    Standing Expiration Date:   09/03/2022    Scheduling Instructions:     Next available      OJoselyn Arrowon room air    Meds ordered this encounter  Medications   montelukast (SINGULAIR) 10 MG tablet    Sig: Take 1 tablet (10 mg total) by mouth at bedtime.    Dispense:  90 tablet    Refill:  3   umeclidinium-vilanterol (ANORO ELLIPTA) 62.5-25 MCG/ACT AEPB    Sig: Inhale 1 puff into the lungs daily.    Dispense:  180 each    Refill:  1    Order Specific Question:   Lot Number?    Answer:   [id number]    Return in about 1 year (around 09/03/2022).  I have spent a total time of 35-minutes on the day of the appointment reviewing prior documentation, coordinating care and discussing medical diagnosis and plan with the patient/family. Past medical history, allergies, medications were reviewed. Pertinent imaging, labs and tests included in this note have been reviewed and interpreted independently by  me.   Chi JRodman Pickle MD [facility] 09/02/2021 10:20 AM

## 2021-09-05 ENCOUNTER — Telehealth: Payer: Self-pay

## 2021-09-05 NOTE — Telephone Encounter (Signed)
PT ASSISTANCE OZEMPIC received, called pt and informed

## 2021-09-09 ENCOUNTER — Ambulatory Visit (INDEPENDENT_AMBULATORY_CARE_PROVIDER_SITE_OTHER)
Admission: RE | Admit: 2021-09-09 | Discharge: 2021-09-09 | Disposition: A | Discharge status: Home / Self Care | Payer: Medicare HMO | Source: Ambulatory Visit | Attending: Acute Care | Admitting: Acute Care

## 2021-09-09 ENCOUNTER — Other Ambulatory Visit: Payer: Self-pay

## 2021-09-09 DIAGNOSIS — Z87891 Personal history of nicotine dependence: Secondary | ICD-10-CM

## 2021-09-10 ENCOUNTER — Other Ambulatory Visit: Payer: Self-pay

## 2021-09-10 DIAGNOSIS — Z87891 Personal history of nicotine dependence: Secondary | ICD-10-CM

## 2021-09-11 ENCOUNTER — Ambulatory Visit
Admission: RE | Admit: 2021-09-11 | Discharge: 2021-09-11 | Disposition: A | Discharge status: Home / Self Care | Payer: Medicare HMO | Source: Ambulatory Visit | Attending: Family Medicine | Admitting: Family Medicine

## 2021-09-11 ENCOUNTER — Encounter: Payer: Self-pay | Admitting: Family Medicine

## 2021-09-11 ENCOUNTER — Other Ambulatory Visit: Payer: Self-pay | Admitting: *Deleted

## 2021-09-11 ENCOUNTER — Other Ambulatory Visit: Payer: Self-pay

## 2021-09-11 ENCOUNTER — Telehealth (INDEPENDENT_AMBULATORY_CARE_PROVIDER_SITE_OTHER): Payer: Medicare HMO | Admitting: Family Medicine

## 2021-09-11 ENCOUNTER — Other Ambulatory Visit (INDEPENDENT_AMBULATORY_CARE_PROVIDER_SITE_OTHER): Payer: Medicare HMO

## 2021-09-11 VITALS — BP 112/81 | HR 120 | Temp 98.9°F | Ht 63.5 in | Wt 229.0 lb

## 2021-09-11 DIAGNOSIS — J441 Chronic obstructive pulmonary disease with (acute) exacerbation: Secondary | ICD-10-CM | POA: Diagnosis not present

## 2021-09-11 DIAGNOSIS — J918 Pleural effusion in other conditions classified elsewhere: Secondary | ICD-10-CM

## 2021-09-11 DIAGNOSIS — R509 Fever, unspecified: Secondary | ICD-10-CM

## 2021-09-11 DIAGNOSIS — R059 Cough, unspecified: Secondary | ICD-10-CM

## 2021-09-11 DIAGNOSIS — R079 Chest pain, unspecified: Secondary | ICD-10-CM | POA: Diagnosis not present

## 2021-09-11 DIAGNOSIS — J449 Chronic obstructive pulmonary disease, unspecified: Secondary | ICD-10-CM | POA: Diagnosis not present

## 2021-09-11 DIAGNOSIS — R051 Acute cough: Secondary | ICD-10-CM | POA: Diagnosis not present

## 2021-09-11 DIAGNOSIS — J811 Chronic pulmonary edema: Secondary | ICD-10-CM | POA: Diagnosis not present

## 2021-09-11 DIAGNOSIS — J189 Pneumonia, unspecified organism: Secondary | ICD-10-CM

## 2021-09-11 LAB — POC COVID19 BINAXNOW: SARS Coronavirus 2 Ag: NEGATIVE

## 2021-09-11 LAB — POCT INFLUENZA A/B
Influenza A, POC: NEGATIVE
Influenza B, POC: NEGATIVE

## 2021-09-11 MED ORDER — AZITHROMYCIN 250 MG PO TABS: ORAL_TABLET | ORAL | 0 refills | Status: DC

## 2021-09-11 MED ORDER — AMOXICILLIN-POT CLAVULANATE 875-125 MG PO TABS: 1.0000 | ORAL_TABLET | Freq: Two times a day (BID) | ORAL | 0 refills | Status: DC

## 2021-09-11 MED ORDER — PREDNISONE 10 MG (21) PO TBPK: ORAL_TABLET | ORAL | 0 refills | Status: DC

## 2021-09-11 NOTE — Patient Instructions (Addendum)
Take the steroid pack as directed. ?Take augmentin twice daily for 10 days, and the z-pak as directed. ?Take furosemide once daily for the next 2 days. ?Continue to use your regular inhaler, and the albuterol if/when needed. ?Continue to monitor your pulse oximetry to know if you need to use oxygen. ?Go to the emergency room if your oxygen drops below 90% and stays there, if you are getting dehydrated, can't keep your antibiotics down, worsening shortness of breath, confusion, or other concerning symptoms develop. ? ?Continue to you tylenol if needed for fever (don't use in excess due to your liver) ?Call to schedule a follow-up appointment for re-evaluation (in person) on Monday. ?Your COVID PCR test should be back tomorrow.  The rapid test and flu tests were all negative. ? ?You can try some warm compresses to the painful area of your left chest. ?Feel better soon! ? ?

## 2021-09-11 NOTE — Progress Notes (Signed)
Start time: 9:52 ?End time: 10:09 ? ?Virtual Visit via Video Note ? ?I connected with Shannon Schultz on 09/11/21 by a video enabled telemedicine application and verified that I am speaking with the correct person using two identifiers. ? ?Location: ?Patient: home ?Provider: office ?  ?I discussed the limitations of evaluation and management by telemedicine and the availability of in person appointments. The patient expressed understanding and agreed to proceed. ? ?History of Present Illness: ? ?Chief Complaint  ?Patient presents with  ? Cough  ?  VIRTUAL cough, congestion, fever and pain in her left side (rib cage, lungs) when she breathes it hurts. Her legs are so weak her husband has to help her get up. Symptoms began Monday night and worsened yesterday. Home covid test yesterday negative.   ? ?Late Monday night her symptoms started (3/13). ?She had a fever, was up to 102 yesterday. ?Yesterday--unable to get out of chair without assistance, due to weakness in her legs.  It is much better today, can pull herself up. ? ?Constant runny nose, clear. ?Cough is productive, clear, not particularly thick. ?More congested today, in her chest. ? ?Oxygen saturation 98-99% at home today, didn't check it yesterday. ?More short of breath yesterday, needed to use albuterol as rescue multiple times during the day, which helped, but temporary. ? ?She is having pain on the left anterior chest, below her breast.  It is tender to touch, but also hurts more with breathing. ? ?+body aches ?Shaky, nervous ?Yesterday had no appetite.  Appetite improved today. She is  ? ?PMH, PSH, SH reviewed ? ?Tylenol every 4 hours, last 5 hours ago. ? ?Outpatient Encounter Medications as of 09/11/2021  ?Medication Sig Note  ? acetaminophen (TYLENOL) 500 MG tablet Take 1,000 mg by mouth every 6 (six) hours as needed. 09/11/2021: Last dose 5am  ? albuterol (VENTOLIN HFA) 108 (90 Base) MCG/ACT inhaler Inhale 2 puffs into the lungs every 6 (six) hours as  needed for wheezing or shortness of breath. 09/11/2021: Used yesterday  ? amoxicillin-clavulanate (AUGMENTIN) 875-125 MG tablet Take 1 tablet by mouth 2 (two) times daily.   ? aspirin EC 81 MG tablet Take 81 mg by mouth daily. Swallow whole.   ? azithromycin (ZITHROMAX) 250 MG tablet Take 2 tablets by mouth on first day, then 1 tablet by mouth on days 2 through 5   ? bisoprolol-hydrochlorothiazide (ZIAC) 10-6.25 MG tablet TAKE 1 TABLET EVERY DAY 09/11/2021: Last dose yesterday  ? CALCIUM ACETATE-MAGNESIUM CARB PO Take 1 tablet by mouth daily. 09/11/2021: Last dose yesterday  ? cetirizine (ZYRTEC) 10 MG tablet Take 10 mg by mouth daily. 09/11/2021: Last dose yesterday  ? glucose blood test strip 1 each by Other route daily. Use as instructed   ? guaiFENesin (MUCINEX) 600 MG 12 hr tablet Take 1,200 mg by mouth daily. 09/11/2021: Last dose yesterday at bedtime  ? Lancets MISC 1 each by Does not apply route daily.   ? metFORMIN (GLUCOPHAGE-XR) 500 MG 24 hr tablet Take 1 tablet (500 mg total) by mouth daily with breakfast. 09/11/2021: Last dose yesterday  ? montelukast (SINGULAIR) 10 MG tablet Take 1 tablet (10 mg total) by mouth at bedtime. 09/11/2021: Last dose yesterday  ? predniSONE (STERAPRED UNI-PAK 21 TAB) 10 MG (21) TBPK tablet Take as directed   ? Semaglutide,0.25 or 0.'5MG'$ /DOS, (OZEMPIC, 0.25 OR 0.5 MG/DOSE,) 2 MG/1.5ML SOPN Inject 0.5 mg into the skin once a week.   ? simvastatin (ZOCOR) 20 MG tablet TAKE 1 TABLET AT BEDTIME  09/11/2021: Last dose yesterday  ? triamcinolone cream (KENALOG) 0.1 % Apply 1 application topically as needed (rash or redness). 09/11/2021: Used yesterday  ? umeclidinium-vilanterol (ANORO ELLIPTA) 62.5-25 MCG/ACT AEPB Inhale 1 puff into the lungs daily. 09/11/2021: Last dose yesterday  ? valACYclovir (VALTREX) 500 MG tablet Take 1 tablet (500 mg total) by mouth daily. 09/11/2021: Last dose yesterdat  ? Alcohol Swabs (B-D SINGLE USE SWABS REGULAR) PADS Use twice a day when checking blood sugars  (Patient not taking: Reported on 09/11/2021)   ? clobetasol ointment (TEMOVATE) 0.05 % SMARTSIG:Sparingly Topical Daily (Patient not taking: Reported on 09/11/2021)   ? furosemide (LASIX) 20 MG tablet Take 1 tablet (20 mg total) by mouth daily. (Patient not taking: Reported on 08/15/2021) 05/08/2021: Does not take when she has to leave the house.  ? ?No facility-administered encounter medications on file as of 09/11/2021.  ? ?Allergies  ?Allergen Reactions  ? Contrast Media [Iodinated Contrast Media] Anaphylaxis, Shortness Of Breath and Other (See Comments)  ?  Could not breath  ? Iohexol Anaphylaxis, Shortness Of Breath and Other (See Comments)  ?  Immediately could not breathe  ? Lisinopril Anaphylaxis, Shortness Of Breath and Rash  ? Sulfa Antibiotics Anaphylaxis and Other (See Comments)  ?  Historical from mother, pt states that mother says she almost died from this drug  ? Latex Other (See Comments)  ?  Unless against on skin for a long time, blisters. Short term is okay.  ? Nickel Other (See Comments)  ?  With earrings pt has soreness and drainage from piercing  ? Codeine Nausea Only, Anxiety and Other (See Comments)  ?  insomnia  ? Levofloxacin Other (See Comments)  ?  insomnia  ? Lipitor [Atorvastatin Calcium] Rash  ? Meloxicam Rash  ?  Broke out in a rash on her stomach,back, legs, and behind ears.  ? Vicodin [Hydrocodone-Acetaminophen] Itching  ? ? ?ROS:  fever, cough, chest pain, shortness of breath per HPI. No n/v/d.  No urinary complaints.  Denies edema. ? ?  ?Observations/Objective: ? ?BP 112/81   Pulse (!) 120   Temp 98.9 ?F (37.2 ?C) (Temporal)   Ht 5' 3.5" (1.613 m)   Wt 229 lb (103.9 kg)   LMP  (LMP Unknown)   BMI 39.93 kg/m?  ? ?Repeated her temp during visit, was 99.6 after 5 hours without tylenol ?Pulse ox 92% when rechecked during visit, and pulse 124 ?She states her usual/baseline pulse ox is 94% ? ?Speaking comfortably, in no distress. ?She is alert, oriented. ?Cranial nerves are grossly  intact. ?Occasional cough during visit. ?Doesn't sound nasal/congested. ?She reports mild tenderness below the left breast (but also hurts to take deep breath) ?Exam is limited due to virtual nature of the visit. ? ?Influenza A&B negative ?Rapid COVID test negative ? ?CXR: ?IMPRESSION: ?1. New small left pleural effusion and pulmonary vascular ?congestion. Correlate for any clinical signs or symptoms of CHF. ? ?She had chest CT (lung cancer screening) 3/13: ?Lungs/Pleura: No pneumothorax. No pleural effusion. Moderate to ?severe centrilobular emphysema with diffuse bronchial wall ?thickening. No acute consolidative airspace disease or lung masses. ?No significant growth of previously visualized pulmonary nodules. No ?new significant pulmonary nodules. ? ?IMPRESSION: ?1. Lung-RADS 2, benign appearance or behavior. Continue annual ?screening with low-dose chest CT without contrast in 12 months. ?2. Three-vessel coronary atherosclerosis. ?3. Cirrhosis. ?4. Aortic Atherosclerosis (ICD10-I70.0) and Emphysema (ICD10-J43.9). ? ? ?Assessment and Plan: ? ?Pleural effusion associated with pulmonary infection - consolidation not seen on CXR, but  given NEW effusion (not present on CT 2d ago), and febrile illness, will cover for pneumonia - Plan: amoxicillin-clavulanate (AUGMENTIN) 875-125 MG tablet, azithromycin (ZITHROMAX) 250 MG tablet ? ?Acute cough - Plan: DG Chest 2 View ? ?Fever, unspecified fever cause - Plan: DG Chest 2 View ? ?Left-sided chest pain - suspect related to cough (MSK component), and effusion/pneumonia. Warm compresses - Plan: DG Chest 2 View ? ?COPD exacerbation ([place]) - Plan: predniSONE (STERAPRED UNI-PAK 21 TAB) 10 MG (21) TBPK tablet ? ?F/u Monday for re-check (in office). ?COVID PCR should be back tomorrow ? ?Follow Up Instructions: ? ?  ?I discussed the assessment and treatment plan with the patient. The patient was provided an opportunity to ask questions and all were answered. The patient agreed with  the plan and demonstrated an understanding of the instructions. ?  ?The patient was advised to call back or seek an in-person evaluation if the symptoms worsen or if the condition fails to improve as anticipated. ? ?

## 2021-09-12 ENCOUNTER — Encounter: Payer: Self-pay | Admitting: Family Medicine

## 2021-09-12 LAB — NOVEL CORONAVIRUS, NAA: SARS-CoV-2, NAA: NOT DETECTED

## 2021-09-13 ENCOUNTER — Encounter (HOSPITAL_COMMUNITY): Payer: Self-pay | Admitting: Emergency Medicine

## 2021-09-13 ENCOUNTER — Other Ambulatory Visit (HOSPITAL_BASED_OUTPATIENT_CLINIC_OR_DEPARTMENT_OTHER): Payer: Self-pay

## 2021-09-13 ENCOUNTER — Ambulatory Visit (INDEPENDENT_AMBULATORY_CARE_PROVIDER_SITE_OTHER): Payer: Medicare HMO

## 2021-09-13 ENCOUNTER — Telehealth: Payer: Self-pay | Admitting: Pulmonary Disease

## 2021-09-13 ENCOUNTER — Ambulatory Visit (HOSPITAL_BASED_OUTPATIENT_CLINIC_OR_DEPARTMENT_OTHER)
Admission: RE | Admit: 2021-09-13 | Discharge: 2021-09-13 | Disposition: A | Discharge status: Home / Self Care | Payer: Medicare HMO | Source: Ambulatory Visit | Attending: Nurse Practitioner | Admitting: Nurse Practitioner

## 2021-09-13 ENCOUNTER — Encounter: Payer: Self-pay | Admitting: Nurse Practitioner

## 2021-09-13 ENCOUNTER — Telehealth: Payer: Self-pay | Admitting: Internal Medicine

## 2021-09-13 ENCOUNTER — Ambulatory Visit (INDEPENDENT_AMBULATORY_CARE_PROVIDER_SITE_OTHER): Payer: Medicare HMO | Admitting: Nurse Practitioner

## 2021-09-13 ENCOUNTER — Other Ambulatory Visit: Payer: Self-pay

## 2021-09-13 ENCOUNTER — Telehealth: Payer: Self-pay | Admitting: *Deleted

## 2021-09-13 ENCOUNTER — Observation Stay (HOSPITAL_COMMUNITY)
Admission: EM | Admit: 2021-09-13 | Discharge: 2021-09-14 | Disposition: A | Discharge status: Home / Self Care | Payer: Medicare HMO | Attending: Student | Admitting: Student

## 2021-09-13 ENCOUNTER — Telehealth: Payer: Self-pay | Admitting: Nurse Practitioner

## 2021-09-13 VITALS — BP 118/70 | HR 98 | Temp 97.6°F | Ht 63.0 in | Wt 225.0 lb

## 2021-09-13 DIAGNOSIS — I5189 Other ill-defined heart diseases: Secondary | ICD-10-CM | POA: Diagnosis not present

## 2021-09-13 DIAGNOSIS — J069 Acute upper respiratory infection, unspecified: Secondary | ICD-10-CM

## 2021-09-13 DIAGNOSIS — R042 Hemoptysis: Secondary | ICD-10-CM

## 2021-09-13 DIAGNOSIS — I2510 Atherosclerotic heart disease of native coronary artery without angina pectoris: Secondary | ICD-10-CM | POA: Diagnosis not present

## 2021-09-13 DIAGNOSIS — I110 Hypertensive heart disease with heart failure: Secondary | ICD-10-CM | POA: Insufficient documentation

## 2021-09-13 DIAGNOSIS — J449 Chronic obstructive pulmonary disease, unspecified: Secondary | ICD-10-CM

## 2021-09-13 DIAGNOSIS — E039 Hypothyroidism, unspecified: Secondary | ICD-10-CM | POA: Diagnosis not present

## 2021-09-13 DIAGNOSIS — Z8616 Personal history of COVID-19: Secondary | ICD-10-CM | POA: Diagnosis not present

## 2021-09-13 DIAGNOSIS — Z8551 Personal history of malignant neoplasm of bladder: Secondary | ICD-10-CM | POA: Insufficient documentation

## 2021-09-13 DIAGNOSIS — Z7984 Long term (current) use of oral hypoglycemic drugs: Secondary | ICD-10-CM | POA: Diagnosis not present

## 2021-09-13 DIAGNOSIS — E1169 Type 2 diabetes mellitus with other specified complication: Secondary | ICD-10-CM | POA: Diagnosis present

## 2021-09-13 DIAGNOSIS — J810 Acute pulmonary edema: Secondary | ICD-10-CM | POA: Diagnosis not present

## 2021-09-13 DIAGNOSIS — J189 Pneumonia, unspecified organism: Secondary | ICD-10-CM

## 2021-09-13 DIAGNOSIS — K7460 Unspecified cirrhosis of liver: Secondary | ICD-10-CM | POA: Diagnosis not present

## 2021-09-13 DIAGNOSIS — I714 Abdominal aortic aneurysm, without rupture, unspecified: Secondary | ICD-10-CM | POA: Diagnosis present

## 2021-09-13 DIAGNOSIS — Z7982 Long term (current) use of aspirin: Secondary | ICD-10-CM | POA: Diagnosis not present

## 2021-09-13 DIAGNOSIS — J90 Pleural effusion, not elsewhere classified: Secondary | ICD-10-CM | POA: Diagnosis not present

## 2021-09-13 DIAGNOSIS — I10 Essential (primary) hypertension: Secondary | ICD-10-CM | POA: Diagnosis present

## 2021-09-13 DIAGNOSIS — Z87891 Personal history of nicotine dependence: Secondary | ICD-10-CM | POA: Insufficient documentation

## 2021-09-13 DIAGNOSIS — R7989 Other specified abnormal findings of blood chemistry: Secondary | ICD-10-CM | POA: Insufficient documentation

## 2021-09-13 DIAGNOSIS — Z79899 Other long term (current) drug therapy: Secondary | ICD-10-CM | POA: Diagnosis not present

## 2021-09-13 DIAGNOSIS — G4734 Idiopathic sleep related nonobstructive alveolar hypoventilation: Secondary | ICD-10-CM

## 2021-09-13 DIAGNOSIS — Z853 Personal history of malignant neoplasm of breast: Secondary | ICD-10-CM | POA: Diagnosis not present

## 2021-09-13 DIAGNOSIS — Z20822 Contact with and (suspected) exposure to covid-19: Secondary | ICD-10-CM | POA: Insufficient documentation

## 2021-09-13 DIAGNOSIS — Z91040 Latex allergy status: Secondary | ICD-10-CM | POA: Insufficient documentation

## 2021-09-13 DIAGNOSIS — I5030 Unspecified diastolic (congestive) heart failure: Secondary | ICD-10-CM | POA: Diagnosis not present

## 2021-09-13 DIAGNOSIS — I2699 Other pulmonary embolism without acute cor pulmonale: Secondary | ICD-10-CM | POA: Diagnosis not present

## 2021-09-13 DIAGNOSIS — E119 Type 2 diabetes mellitus without complications: Secondary | ICD-10-CM | POA: Insufficient documentation

## 2021-09-13 DIAGNOSIS — Z952 Presence of prosthetic heart valve: Secondary | ICD-10-CM | POA: Diagnosis not present

## 2021-09-13 LAB — CBC WITH DIFFERENTIAL/PLATELET
Basophils Absolute: 0 10*3/uL (ref 0.0–0.1)
Basophils Relative: 0.1 % (ref 0.0–3.0)
Eosinophils Absolute: 0 10*3/uL (ref 0.0–0.7)
Eosinophils Relative: 0 % (ref 0.0–5.0)
HCT: 39.9 % (ref 36.0–46.0)
Hemoglobin: 13.7 g/dL (ref 12.0–15.0)
Lymphocytes Relative: 8.6 % — ABNORMAL LOW (ref 12.0–46.0)
Lymphs Abs: 0.5 10*3/uL — ABNORMAL LOW (ref 0.7–4.0)
MCHC: 34.3 g/dL (ref 30.0–36.0)
MCV: 89.7 fl (ref 78.0–100.0)
Monocytes Absolute: 0.1 10*3/uL (ref 0.1–1.0)
Monocytes Relative: 2.1 % — ABNORMAL LOW (ref 3.0–12.0)
Neutro Abs: 5.5 10*3/uL (ref 1.4–7.7)
Neutrophils Relative %: 89.2 % — ABNORMAL HIGH (ref 43.0–77.0)
Platelets: 143 10*3/uL — ABNORMAL LOW (ref 150.0–400.0)
RBC: 4.44 Mil/uL (ref 3.87–5.11)
RDW: 15.3 % (ref 11.5–15.5)
WBC: 6.2 10*3/uL (ref 4.0–10.5)

## 2021-09-13 LAB — COMPREHENSIVE METABOLIC PANEL
ALT: 22 U/L (ref 0–35)
AST: 18 U/L (ref 0–37)
Albumin: 3.8 g/dL (ref 3.5–5.2)
Alkaline Phosphatase: 54 U/L (ref 39–117)
BUN: 31 mg/dL — ABNORMAL HIGH (ref 6–23)
CO2: 28 mEq/L (ref 19–32)
Calcium: 10.1 mg/dL (ref 8.4–10.5)
Chloride: 99 mEq/L (ref 96–112)
Creatinine, Ser: 0.92 mg/dL (ref 0.40–1.20)
GFR: 60.99 mL/min (ref >60.00)
Glucose, Bld: 347 mg/dL — ABNORMAL HIGH (ref 70–99)
Potassium: 4.5 mEq/L (ref 3.5–5.1)
Sodium: 135 mEq/L (ref 135–145)
Total Bilirubin: 0.9 mg/dL (ref 0.2–1.2)
Total Protein: 7 g/dL (ref 6.0–8.3)

## 2021-09-13 LAB — PROTIME-INR
INR: 1.1 ratio — ABNORMAL HIGH (ref 0.8–1.0)
Prothrombin Time: 12.4 s (ref 9.6–13.1)

## 2021-09-13 LAB — D-DIMER, QUANTITATIVE: D-Dimer, Quant: 0.62 mcg/mL FEU — ABNORMAL HIGH (ref <0.50)

## 2021-09-13 LAB — BRAIN NATRIURETIC PEPTIDE: Pro B Natriuretic peptide (BNP): 170 pg/mL — ABNORMAL HIGH (ref 0.0–100.0)

## 2021-09-13 MED ORDER — SODIUM CHLORIDE 0.9 % IV SOLN
500.0000 mg | INTRAVENOUS | Status: DC
  Administered 2021-09-14: 500 mg via INTRAVENOUS
  Filled 2021-09-13: qty 5

## 2021-09-13 MED ORDER — HEPARIN (PORCINE) 25000 UT/250ML-% IV SOLN
1250.0000 [IU]/h | INTRAVENOUS | Status: DC
  Administered 2021-09-14: 1250 [IU]/h via INTRAVENOUS
  Filled 2021-09-13: qty 250

## 2021-09-13 MED ORDER — PREDNISONE 50 MG PO TABS
50.0000 mg | ORAL_TABLET | Freq: Once | ORAL | 0 refills | Status: DC
  Filled 2021-09-13: qty 1, 1d supply, fill #0

## 2021-09-13 MED ORDER — SODIUM CHLORIDE 0.9 % IV SOLN
1.0000 g | INTRAVENOUS | Status: DC
  Administered 2021-09-14: 1 g via INTRAVENOUS
  Filled 2021-09-13 (×2): qty 10

## 2021-09-13 MED ORDER — IOHEXOL 350 MG/ML SOLN
80.0000 mL | Freq: Once | INTRAVENOUS | Status: AC | PRN
Start: 2021-09-13 — End: 2021-09-13
  Administered 2021-09-13: 80 mL via INTRAVENOUS

## 2021-09-13 NOTE — Assessment & Plan Note (Signed)
G1DD on recent echo. No peripheral edema noted on exam. Persistent small left pleural effusion. Will check BNP today.  ?

## 2021-09-13 NOTE — Progress Notes (Signed)
Notified pt evidence of PE on CTA as well as LLL consolidation. MD from [place] had already contacted her and advised her to go to the ED. Pt verbalized understanding and plans to go to [facility] now.

## 2021-09-13 NOTE — Telephone Encounter (Signed)
Called and spoke with Santiago Glad with radiology at [facility], we understand that the prednisone is not exactly their protocol.  She took 60 mg Wednesday, 50 mg Thursday, 40 mg Friday.  She was called and advised to take '50mg'$  additionally of prednisone prior to the CT scan.  She took Benadryl at 4:30 pm prior to her 6 pm CT scan.  She will continue her taper on Saturday of 30 mg, Sunday 20 mg and 10 mg on Monday.  She was advised to see emergency care if she has any swelling of face, lips or tongue.  She verbalized understanding.  Magda Paganini CMA spoke with [place] at [place]  earlier in the day and was told that the prednisone taper that had been given was fine and there would be no problem.  Nothing further needed. ?

## 2021-09-13 NOTE — Assessment & Plan Note (Signed)
No increased O2 demand. Oxygen stable on room air at 95%. Advised to monitor at home for goal >88-90%. Continue supplemental O2 2 lpm at night.  ?

## 2021-09-13 NOTE — Assessment & Plan Note (Addendum)
Persistent small left pleural effusion. Currently on lasix 20 mg for 2 days prescribed by PCP. No evidence of superimposed infection on recent CT or CXR. Recent echo in February with normal EF and G1DD. Concern pleural effusion r/t worsening hepatic function and cirrhosis but could also be HF or r/t infectious process given recent fevers. Check BNP and CMET today. May increase lasix dosing depending on kidney function.  ?

## 2021-09-13 NOTE — Progress Notes (Addendum)
Notified pt of lab findings and positive d dimer. Will order STAT CTA with PE protocol for further evaluation. She has an allergy to contrast dye but states that she does fine as long as she is premedicated beforehand. She did have a CTA in January 2022 without any difficulties. Currently on prednisone taper and has been since 3/15. Will need benadryl prior to scan. Other labs relatively unremarkable. Suspect elevated neutrophils and BS from prednisone use. Advised to monitor BS at home and seek further care if consistently >350-500. Liver function was nl. INR borderline elevated but unchanged from previous with normal PT. Pt verbalized understanding.  ?

## 2021-09-13 NOTE — Patient Instructions (Addendum)
-  Continue Albuterol inhaler 2 puffs or 3 mL neb every 6 hours as needed for shortness of breath or wheezing. Notify if symptoms persist despite rescue inhaler/neb use. ?-Continue Anoro 1 puff daily  ?-Continue zyrtec 10 mg daily  ?-Continue mucinex 600 mg Twice daily  ?-Continue singulair 10 mg At bedtime  ? ?-Continue augmentin and z pack as previously prescribed by PCP ?-Complete prednisone taper as previously directed  ?-Continue lasix 20 mg today. We may increase your dose for a few days depending on your lab findings. ? ?Labs today - CMET, d dimer, PT/INR, CBC with diff, BNP ? ?Seek emergency care if you begin coughing up more blood, experience respiratory distress, or decreased oxygen levels <88-90%.  ? ?Follow up in 2 weeks with Dr. Loanne Drilling. If symptoms do not improve or worsen, please contact office for sooner follow up or seek emergency care. ?

## 2021-09-13 NOTE — Progress Notes (Signed)
@Patient  ID: Shannon Schultz, female    DOB: May 20, 1946, 76 y.o.   MRN: [id number]  Chief Complaint  Patient presents with   Follow-up    She is still having a little bit this morning.     Referring provider: Joselyn Arrow, MD  HPI: 76 year old female, former smoker (46 pack years) followed for COPD, allergic rhinitis, nocturnal hypoxemia related to mild OSA.  She is a patient of Dr. George Hugh and last seen in office on 09/02/2021.  Past medical history significant for hypertension, DM2, AF status post TAVR 08/19/2020, history of breast cancer status post lumpectomy, bladder cancer status post resection and chemo, AAA without rupture, venous insufficiency, cirrhosis of liver without ascites.  TEST/EVENTS:  07/31/2021 echocardiogram: EF 60 to 65%.  G1 DD.  RV size and function is normal.  PASP normal.  Trivial MR.  AVA has been repaired/replaced. 09/09/2021 LDCT chest: Atherosclerosis.  Moderate to severe centrilobular emphysema with diffuse bronchial wall thickening.  No acute consolidative airspace disease or lung masses or pleural effusions.  No new significant pulmonary nodules previously identified nodules were stable.  Lung RADS 2.  Diffuse liver surface irregularity compatible with cirrhosis. 09/11/2021 CXR 2 view: New small left pleural effusion and pulmonary vascular congestion.  Subsegmental atelectasis noted within the left lung base.  No airspace consolidation noted.  09/02/2021: OV with Dr. Everardo All.  Maintained on Anoro and as needed albuterol.  Continued on Zyrtec and Singulair for allergic rhinitis.  Stop Flonase due to nosebleeds.  Advised to call for Atrovent if congestion recurs.  09/11/2021: Virtual visit with PCP.  Reported new fevers up to 102 and chills with new cough.  Chest x-ray was ordered which showed small left pleural effusion and vascular congestion.  Patient was treated empirically with Augmentin and Z-Pak.  Put on prednisone taper.  Contacted their office a few days later to  notify that she began coughing up blood.  Advised to seek further follow-up with pulm.  09/13/2021: Today -acute visit Patient presents today with husband for hemoptysis.  She reports that on Monday she had lung cancer screening CT followed by a eye doctor appointment where they dilated her eyes.  She began feeling a little more fatigued later that day, thought it was related to her recent appointments but then developed fever up to 102 that night as well as productive cough.  Also had headaches and some mild nasal congestion. She contacted her doctor's office and was set up a virtual visit on Wednesday.  COVID and flu were negative. Treated empirically with Augmentin, Z-Pak and prednisone taper.  Chest x-ray did not show any evidence of superimposed infection; however did have new small left-sided pleural effusion and vascular congestion which was not identified on her lung cancer screening CT from Monday.  She was also placed on Lasix 20 mg for 2 days by her PCP for this.  Today, she reports that she began coughing up blood yesterday.  States that it is about the same today and looks more like clots.  She did bring a sample of bloody sputum with her; no clots.  Reports that her breathing is relatively stable.  Does still have some intermittent left sided pain. She feels better after starting on antibiotics, prednisone and Lasix.  Although she has only taken 1 dose of Lasix so far.  Does have a history of cirrhosis. Feels as though her abdomen is more swollen than usual. No jaundice/yellowing eyes or excessive bruising, clay colored stools or dark urine. Denies  any recent weight loss, anorexia, orthopnea, PND, chest pain or worsened lower extremity swelling. Continues on Anoro. Not requiring PRN albuterol.   Allergies  Allergen Reactions   Contrast Media [Iodinated Contrast Media] Anaphylaxis, Shortness Of Breath and Other (See Comments)    Could not breath   Iohexol Anaphylaxis, Shortness Of Breath and  Other (See Comments)    Immediately could not breathe   Lisinopril Anaphylaxis, Shortness Of Breath and Rash   Sulfa Antibiotics Anaphylaxis and Other (See Comments)    Historical from mother, pt states that mother says she almost died from this drug   Latex Other (See Comments)    Unless against on skin for a long time, blisters. Short term is okay.   Codeine Nausea Only, Anxiety and Other (See Comments)    insomnia   Levofloxacin Other (See Comments)    insomnia   Lipitor [Atorvastatin Calcium] Rash   Meloxicam Rash    Broke out in a rash on her stomach,back, legs, and behind ears.   Nickel Other (See Comments)    With earrings pt has soreness and drainage from piercing   Vicodin [Hydrocodone-Acetaminophen] Itching    Immunization History  Administered Date(s) Administered   Fluad Quad(high Dose 65+) 04/07/2019, 04/26/2020, 05/08/2021   Influenza Split 05/28/2011   Influenza, High Dose Seasonal PF 04/07/2013, 05/15/2014, 05/30/2015, 03/25/2016, 03/05/2017, 04/01/2018   Influenza, Seasonal, Injecte, Preservative Fre 06/07/2012   PFIZER Comirnaty(Gray Top)Covid-19 Tri-Sucrose Vaccine 10/25/2020   PFIZER(Purple Top)SARS-COV-2 Vaccination 08/20/2019, 09/13/2019, 04/14/2020   Pfizer Covid-19 Vaccine Bivalent Booster 68yrs & up 05/08/2021   Pneumococcal Conjugate-13 07/06/2014   Pneumococcal Polysaccharide-23 12/15/2004, 05/28/2011, 05/27/2021   Tdap 02/23/2008, 06/15/2018   Zoster Recombinat (Shingrix) 12/02/2017, 02/06/2018   Zoster, Live 05/17/2010    Past Medical History:  Diagnosis Date   Arthritis    "hands" (05/15/2017)   Bladder cancer (HCC) 2018   Breast cancer, right (HCC) 1992   DCIS,bladder ca (just dx)   Colon polyp    Complication of anesthesia 1992   "local anesthesia" used was hard to awaken from-no problems since (05/15/2017)   COPD (chronic obstructive pulmonary disease) (HCC)    Coronary artery disease    COVID-19 virus infection 05/05/2019    Diverticulosis    Elevated liver enzymes    fatty liver per ultrasound per pt   FHx: BRCA2 gene positive    sister with BRCA2 mutation (pt tested NEGATIVE)   HLD (hyperlipidemia)    HSV (herpes simplex virus) infection    on hip--on daily suppression   Hypertension    Hypothyroidism    took med 7 yrs after birth of 1st child   Impaired glucose tolerance    Migraine    On home oxygen therapy    "have it available but I'm not using it" (05/15/2017)   Osteopenia    S/P TAVR (transcatheter aortic valve replacement) 08/14/2020   s/p TAVR with a 26 mm Edwards S3U via the left subclavian approach by Dr. Cornelius Moras and Dr. Clifton James   Severe aortic stenosis    Sleep apnea    Sleeps with 2 L O2 @ night   Type 2 diabetes mellitus (HCC)    Ventral hernia    Vitamin D deficiency disease     Tobacco History: Social History   Tobacco Use  Smoking Status Former   Packs/day: 1.00   Years: 46.00   Pack years: 46.00   Types: Cigarettes   Quit date: 05/31/2011   Years since quitting: 10.2  Smokeless Tobacco Never  Counseling given: Not Answered   Outpatient Medications Prior to Visit  Medication Sig Dispense Refill   acetaminophen (TYLENOL) 500 MG tablet Take 1,000 mg by mouth every 6 (six) hours as needed.     albuterol (VENTOLIN HFA) 108 (90 Base) MCG/ACT inhaler Inhale 2 puffs into the lungs every 6 (six) hours as needed for wheezing or shortness of breath. 3 each 3   Alcohol Swabs (B-D SINGLE USE SWABS REGULAR) PADS Use twice a day when checking blood sugars 100 each 1   amoxicillin-clavulanate (AUGMENTIN) 875-125 MG tablet Take 1 tablet by mouth 2 (two) times daily. 20 tablet 0   aspirin EC 81 MG tablet Take 81 mg by mouth daily. Swallow whole.     azithromycin (ZITHROMAX) 250 MG tablet Take 2 tablets by mouth on first day, then 1 tablet by mouth on days 2 through 5 6 tablet 0   bisoprolol-hydrochlorothiazide (ZIAC) 10-6.25 MG tablet TAKE 1 TABLET EVERY DAY 90 tablet 1   CALCIUM  ACETATE-MAGNESIUM CARB PO Take 1 tablet by mouth daily.     cetirizine (ZYRTEC) 10 MG tablet Take 10 mg by mouth daily.     clobetasol ointment (TEMOVATE) 0.05 %      furosemide (LASIX) 20 MG tablet Take 1 tablet (20 mg total) by mouth daily. 90 tablet 3   glucose blood test strip 1 each by Other route daily. Use as instructed 100 each 2   guaiFENesin (MUCINEX) 600 MG 12 hr tablet Take 1,200 mg by mouth daily.     Lancets MISC 1 each by Does not apply route daily. 100 each 2   metFORMIN (GLUCOPHAGE-XR) 500 MG 24 hr tablet Take 1 tablet (500 mg total) by mouth daily with breakfast. 1 tablet 0   montelukast (SINGULAIR) 10 MG tablet Take 1 tablet (10 mg total) by mouth at bedtime. 90 tablet 3   predniSONE (STERAPRED UNI-PAK 21 TAB) 10 MG (21) TBPK tablet Take as directed 21 each 0   Semaglutide,0.25 or 0.5MG /DOS, (OZEMPIC, 0.25 OR 0.5 MG/DOSE,) 2 MG/1.5ML SOPN Inject 0.5 mg into the skin once a week. 6 mL 0   simvastatin (ZOCOR) 20 MG tablet TAKE 1 TABLET AT BEDTIME 90 tablet 0   triamcinolone cream (KENALOG) 0.1 % Apply 1 application topically as needed (rash or redness).     umeclidinium-vilanterol (ANORO ELLIPTA) 62.5-25 MCG/ACT AEPB Inhale 1 puff into the lungs daily. 180 each 1   valACYclovir (VALTREX) 500 MG tablet Take 1 tablet (500 mg total) by mouth daily. 90 tablet 3   No facility-administered medications prior to visit.     Review of Systems:   Constitutional: No weight loss or gain, night sweats, chills.  +recent fevers, now resolved (T max 102) HEENT: No difficulty swallowing, tooth/dental problems, or sore throat. No sneezing, itching, ear ache. +mild nasal congestion, headaches (resolved) CV:  +occasionally chronic swelling in lower extremities, L>R, no worsening from baseline. No chest pain, orthopnea, PND, anasarca, dizziness, palpitations, syncope Resp: + Productive cough (previously white in color); hemoptysis; left pleuritic pain (laterally/posteriorly).  No shortness of  breath with exertion or at rest.  No wheezing.  No chest wall deformity GI:  +slightly increased abdominal swelling. No heartburn, indigestion, abdominal pain, nausea, vomiting, diarrhea, change in bowel habits, loss of appetite, bloody stools.  GU: No dysuria, change in color of urine, urgency or frequency.  No flank pain, no hematuria  Skin: No rash, lesions, ulcerations MSK:  No joint pain or swelling.  No decreased range of motion.  No back pain. Neuro: No dizziness or lightheadedness.  Psych: No depression or anxiety. Mood stable.     Physical Exam:  BP 118/70 (BP Location: Left Arm, Patient Position: Sitting, Cuff Size: Large)   Pulse 98   Temp 97.6 F (36.4 C) (Oral)   Ht 5\' 3"  (1.6 m)   Wt 225 lb (102.1 kg)   LMP  (LMP Unknown)   SpO2 95%   BMI 39.86 kg/m   GEN: Pleasant, interactive, well-kempt; obese; in no acute distress HEENT:  Normocephalic and atraumatic. PERRLA. Sclera white. Nasal turbinates pink, moist and patent bilaterally. No rhinorrhea present. Oropharynx pink and moist, without exudate or edema. No lesions, ulcerations, or postnasal drip.  NECK:  Supple w/ fair ROM. No JVD present. Normal carotid impulses w/o bruits. Thyroid symmetrical with no goiter or nodules palpated. No lymphadenopathy.   CV: RRR, no m/r/g, no peripheral edema. Pulses intact, +2 bilaterally. No cyanosis, pallor or clubbing. PULMONARY:  Unlabored, regular breathing. Diminished breath sounds L>R base; otherwise, clear bilaterally A&P w/o wheezes/rales/rhonchi. No accessory muscle use. No dullness to percussion. GI: BS present and normoactive. Soft, non-tender to palpation. Protuberant abd. No organomegaly or masses detected. No CVA tenderness. MSK: No erythema, warmth or tenderness. Cap refil <2 sec all extrem. No deformities or joint swelling noted.  Neuro: A/Ox3. No focal deficits noted.   Skin: Warm, no lesions or rashes. Discoloration to BLE Psych: Normal affect and behavior. Judgement and  thought content appropriate.     Lab Results:  CBC    Component Value Date/Time   WBC 3.9 (L) 04/02/2021 0832   RBC 4.43 04/02/2021 0832   HGB 13.4 04/02/2021 0832   HGB 13.0 04/01/2021 1701   HCT 42.1 04/02/2021 0832   HCT 38.4 04/01/2021 1701   PLT 100 (L) 04/02/2021 0832   PLT 95 (LL) 04/01/2021 1701   MCV 95.0 04/02/2021 0832   MCV 90 04/01/2021 1701   MCH 30.2 04/02/2021 0832   MCHC 31.8 04/02/2021 0832   RDW 15.2 04/02/2021 0832   RDW 14.2 04/01/2021 1701   LYMPHSABS 0.7 04/02/2021 0832   LYMPHSABS 1.0 04/01/2021 1701   MONOABS 0.2 04/02/2021 0832   EOSABS 0.1 04/02/2021 0832   EOSABS 0.1 04/01/2021 1701   BASOSABS 0.0 04/02/2021 0832   BASOSABS 0.1 04/01/2021 1701    BMET    Component Value Date/Time   NA 149 (H) 06/17/2021 1207   K 4.7 06/17/2021 1207   CL 106 06/17/2021 1207   CO2 28 06/17/2021 1207   GLUCOSE 155 (H) 06/17/2021 1207   GLUCOSE 179 (H) 04/02/2021 0832   BUN 13 06/17/2021 1207   CREATININE 0.64 06/17/2021 1207   CREATININE 0.75 03/05/2017 0736   CALCIUM 9.6 06/17/2021 1207   GFRNONAA >60 04/02/2021 0832   GFRAA 97 07/02/2020 1149    BNP    Component Value Date/Time   BNP 55.4 04/02/2021 0832   BNP 35.1 08/27/2015 0001     Imaging:  09/13/2021: CXR reviewed by me. Persistent small left pleural effusion. Improved vascular congestion without pulmonary edema or new focal infiltrates/consolidation.   DG Chest 2 View  Result Date: 09/13/2021 CLINICAL DATA:  Hemoptysis EXAM: CHEST - 2 VIEW COMPARISON:  Previous studies including the examination of 09/11/2021 FINDINGS: Transverse diameter of heart is increased. There is prosthetic stent in the region of aortic valve. There are no signs of alveolar pulmonary edema or focal pulmonary consolidation. Small left pleural effusion is seen. There is evidence of right mastectomy. No significant interval  changes are noted. IMPRESSION: Small left pleural effusion. There are no signs of pulmonary edema  or new focal infiltrates. Electronically Signed   By: Ernie Avena M.D.   On: 09/13/2021 10:16   DG Chest 2 View  Result Date: 09/11/2021 CLINICAL DATA:  Fever, cough, COPD. EXAM: CHEST - 2 VIEW COMPARISON:  Lung cancer screening CT from 09/09/2021. FINDINGS: Stable cardiomediastinal contours. There is a new small left pleural effusion with diffuse pulmonary vascular congestion. Subsegmental atelectasis noted within the left base. No airspace consolidation. IMPRESSION: 1. New small left pleural effusion and pulmonary vascular congestion. Correlate for any clinical signs or symptoms of CHF. Electronically Signed   By: Signa Kell M.D.   On: 09/11/2021 12:17   CT CHEST LUNG CA SCREEN LOW DOSE W/O CM  Result Date: 09/09/2021 CLINICAL DATA:  76 year old asymptomatic female former smoker with 45 pack-year smoking history, quit smoking in 2012. EXAM: CT CHEST WITHOUT CONTRAST LOW-DOSE FOR LUNG CANCER SCREENING TECHNIQUE: Multidetector CT imaging of the chest was performed following the standard protocol without IV contrast. RADIATION DOSE REDUCTION: This exam was performed according to the departmental dose-optimization program which includes automated exposure control, adjustment of the mA and/or kV according to patient size and/or use of iterative reconstruction technique. COMPARISON:  03/26/2020 screening chest CT. FINDINGS: Cardiovascular: Normal heart size. No significant pericardial effusion/thickening. Three-vessel coronary atherosclerosis. Aortic valve prosthesis in place. Atherosclerotic nonaneurysmal thoracic aorta. Top-normal caliber main pulmonary artery (3.1 cm diameter). Mediastinum/Nodes: No discrete thyroid nodules. Unremarkable esophagus. Surgical clips noted in the bilateral axilla. No axillary adenopathy. Lungs/Pleura: No pneumothorax. No pleural effusion. Moderate to severe centrilobular emphysema with diffuse bronchial wall thickening. No acute consolidative airspace disease or lung  masses. No significant growth of previously visualized pulmonary nodules. No new significant pulmonary nodules. Upper abdomen: Diffuse liver surface irregularity compatible with cirrhosis. Cholecystectomy. Musculoskeletal: No aggressive appearing focal osseous lesions. Moderate thoracic spondylosis. Right mastectomy. IMPRESSION: 1. Lung-RADS 2, benign appearance or behavior. Continue annual screening with low-dose chest CT without contrast in 12 months. 2. Three-vessel coronary atherosclerosis. 3. Cirrhosis. 4. Aortic Atherosclerosis (ICD10-I70.0) and Emphysema (ICD10-J43.9). Electronically Signed   By: Delbert Phenix M.D.   On: 09/09/2021 17:04     PFT Results Latest Ref Rng & Units 09/24/2016  FVC-Pre L 2.38  FVC-Predicted Pre % 79  FVC-Post L 2.45  FVC-Predicted Post % 82  Pre FEV1/FVC % % 55  Post FEV1/FCV % % 58  FEV1-Pre L 1.32  FEV1-Predicted Pre % 58  FEV1-Post L 1.43  DLCO uncorrected ml/min/mmHg 13.93  DLCO UNC% % 57  DLCO corrected ml/min/mmHg 13.25  DLCO COR %Predicted % 54  DLVA Predicted % 58  TLC L 5.00  TLC % Predicted % 99  RV % Predicted % 108    No results found for: NITRICOXIDE      Assessment & Plan:   Hemoptysis New bloody sputum; no mass hemoptysis. Overall stable and in no distress without SOB. Hx of cirrhosis with increased abdominal swelling recently. Will check PT/INR, CBC with diff and d dimer. D dimer likely to be elevated so will check kidney function for possible CTA chest to r/o PE. Strict ED precautions.   Patient Instructions  -Continue Albuterol inhaler 2 puffs or 3 mL neb every 6 hours as needed for shortness of breath or wheezing. Notify if symptoms persist despite rescue inhaler/neb use. -Continue Anoro 1 puff daily  -Continue zyrtec 10 mg daily  -Continue mucinex 600 mg Twice daily  -Continue singulair 10 mg At  bedtime   -Continue augmentin and z pack as previously prescribed by PCP -Complete prednisone taper as previously directed   -Continue lasix 20 mg today. We may increase your dose for a few days depending on your lab findings.  Labs today - CMET, d dimer, PT/INR, CBC with diff, BNP  Seek emergency care if you begin coughing up more blood, experience respiratory distress, or decreased oxygen levels <88-90%.   Follow up in 2 weeks with Dr. Everardo All. If symptoms do not improve or worsen, please contact office for sooner follow up or seek emergency care.    Pleural effusion Persistent small left pleural effusion. Currently on lasix 20 mg for 2 days prescribed by PCP. No evidence of superimposed infection on recent CT or CXR. Recent echo in February with normal EF and G1DD. Concern pleural effusion r/t worsening hepatic function and cirrhosis but could also be HF or r/t infectious process given recent fevers. Check BNP and CMET today. May increase lasix dosing depending on kidney function.   COPD, group B, by GOLD 2017 classification (HCC) Breathing stable. Maintained on Anoro. Currently on prednisone, z pack and augmentin for possible CAP by PCP. No evidence of superimposed infection on CXR today.   Nocturnal hypoxemia No increased O2 demand. Oxygen stable on room air at 95%. Advised to monitor at home for goal >88-90%. Continue supplemental O2 2 lpm at night.   Diastolic dysfunction G1DD on recent echo. No peripheral edema noted on exam. Persistent small left pleural effusion. Will check BNP today.   URI (upper respiratory infection) Recent fevers (T max 102) with headaches, mild nasal congestion and cough. COVID and flu negative at PCP. PCP was concerned for possible early pneumonia and empirically placed pt on augmentin and z pack courses. These symptoms have mostly improved. CXR without consolidation or opacities but did have small left pleural effusion.    I spent 40 minutes of dedicated to the care of this patient on the date of this encounter to include pre-visit review of records, face-to-face time with the  patient discussing conditions above, post visit ordering of testing, clinical documentation with the electronic health record, making appropriate referrals as documented, and communicating necessary findings to members of the patients care team.  Noemi Chapel, NP 09/13/2021  Pt aware and understands NP's role.

## 2021-09-13 NOTE — Progress Notes (Signed)
ANTICOAGULATION CONSULT NOTE - Initial Consult ? ?Pharmacy Consult for Heparin ?Indication: pulmonary embolus ? ?Allergies  ?Allergen Reactions  ? Contrast Media [Iodinated Contrast Media] Anaphylaxis, Shortness Of Breath and Other (See Comments)  ?  Could not breath  ? Iohexol Anaphylaxis, Shortness Of Breath and Other (See Comments)  ?  Immediately could not breathe  ? Lisinopril Anaphylaxis, Shortness Of Breath and Rash  ? Sulfa Antibiotics Anaphylaxis and Other (See Comments)  ?  Historical from mother, pt states that mother says she almost died from this drug  ? Latex Other (See Comments)  ?  Unless against on skin for a long time, blisters. Short term is okay.  ? Codeine Nausea Only, Anxiety and Other (See Comments)  ?  insomnia  ? Levofloxacin Other (See Comments)  ?  insomnia  ? Lipitor [Atorvastatin Calcium] Rash  ? Meloxicam Rash  ?  Broke out in a rash on her stomach,back, legs, and behind ears.  ? Nickel Other (See Comments)  ?  With earrings pt has soreness and drainage from piercing  ? Vicodin [Hydrocodone-Acetaminophen] Itching  ? ? ?Patient Measurements: ?  ?Heparin Dosing Weight: 76 kg ? ?Vital Signs: ?Temp: 97.6 ?F (36.4 ?C) (03/17 2238) ?Temp Source: Oral (03/17 2238) ?BP: 137/67 (03/17 2315) ?Pulse Rate: 60 (03/17 2315) ? ?Labs: ?Recent Labs  ?  09/13/21 ?1102  ?HGB 13.7  ?HCT 39.9  ?PLT 143.0*  ?LABPROT 12.4  ?INR 1.1*  ?CREATININE 0.92  ? ? ?Estimated Creatinine Clearance: 60.3 mL/min (by C-G formula based on SCr of 0.92 mg/dL). ? ? ?Medical History: ?Past Medical History:  ?Diagnosis Date  ? Arthritis   ? "hands" (05/15/2017)  ? Bladder cancer ([place]) 2018  ? Breast cancer, right ([place]) 1992  ? DCIS,bladder ca (just dx)  ? Colon polyp   ? Complication of anesthesia 1992  ? "local anesthesia" used was hard to awaken from-no problems since (05/15/2017)  ? COPD (chronic obstructive pulmonary disease) ([place])   ? Coronary artery disease   ? COVID-19 virus infection 05/05/2019  ? Diverticulosis   ?  Elevated liver enzymes   ? fatty liver per ultrasound per pt  ? FHx: BRCA2 gene positive   ? sister with BRCA2 mutation (pt tested NEGATIVE)  ? HLD (hyperlipidemia)   ? HSV (herpes simplex virus) infection   ? on hip--on daily suppression  ? Hypertension   ? Hypothyroidism   ? took med 7 yrs after birth of 1st child  ? Impaired glucose tolerance   ? Migraine   ? On home oxygen therapy   ? "have it available but I'm not using it" (05/15/2017)  ? Osteopenia   ? S/P TAVR (transcatheter aortic valve replacement) 08/14/2020  ? s/p TAVR with a 26 mm Edwards S3U via the left subclavian approach by Dr. Roxy Manns and Dr. Angelena Form  ? Severe aortic stenosis   ? Sleep apnea   ? Sleeps with 2 L O2 @ night  ? Type 2 diabetes mellitus ([place])   ? Ventral hernia   ? Vitamin D deficiency disease   ? ? ?Medications:  ?No oral anticoagulation PTA ? ?Assessment: ?76 yr female sent by physician for admission for + pulmonary embolism seen on CT Angio today.  Patient had seen MD for reports of hemoptysis ?PMH significant for HTN, DM, AF s/p TAVR, h/o breast ancer, bladder cancer, AAA without rupture, cirrhosis of liver ?Baseline INR = 1.1 ? ?Goal of Therapy:  ?Heparin level 0.3-0.7 units/ml ?Monitor platelets by anticoagulation protocol: Yes ?  ?  Plan:  ?No heparin bolus per Delmer Islam, PA-C due to reported hemoptysis earlier today ?Begin IV heparin gtt @ 1250 units/hr ?Check heparin level 8 hr after infusion started ?Daily CBC and heparin level ?Monitor for signs & symptoms of bleeding ? ?Anyae Griffith, Toribio Harbour ?09/13/2021,11:39 PM ? ? ?

## 2021-09-13 NOTE — Telephone Encounter (Signed)
I spoke to patient about urgent CTPA result.  She was seen today for hemoptysis.  She is on treatment for pneumonia.  CTPA showed evidence of consolidation in the LLL and pulmonary emboli.  I spoke to patient and recommended she gp to the ED for evaluation for admission.  She understood and agreed to report to the hospital. ?

## 2021-09-13 NOTE — Addendum Note (Signed)
Addended by: Clayton Bibles on: 09/13/2021 05:25 PM ? ? Modules accepted: Orders ? ?

## 2021-09-13 NOTE — Assessment & Plan Note (Signed)
Breathing stable. Maintained on Anoro. Currently on prednisone, z pack and augmentin for possible CAP by PCP. No evidence of superimposed infection on CXR today.  ?

## 2021-09-13 NOTE — Assessment & Plan Note (Addendum)
New bloody sputum; no mass hemoptysis. Overall stable and in no distress without SOB. Hx of cirrhosis with increased abdominal swelling recently. Will check PT/INR, CBC with diff and d dimer. D dimer likely to be elevated so will check kidney function for possible CTA chest to r/o PE. Strict ED precautions.  ? ?Patient Instructions  ?-Continue Albuterol inhaler 2 puffs or 3 mL neb every 6 hours as needed for shortness of breath or wheezing. Notify if symptoms persist despite rescue inhaler/neb use. ?-Continue Anoro 1 puff daily  ?-Continue zyrtec 10 mg daily  ?-Continue mucinex 600 mg Twice daily  ?-Continue singulair 10 mg At bedtime  ? ?-Continue augmentin and z pack as previously prescribed by PCP ?-Complete prednisone taper as previously directed  ?-Continue lasix 20 mg today. We may increase your dose for a few days depending on your lab findings. ? ?Labs today - CMET, d dimer, PT/INR, CBC with diff, BNP ? ?Seek emergency care if you begin coughing up more blood, experience respiratory distress, or decreased oxygen levels <88-90%.  ? ?Follow up in 2 weeks with Dr. Loanne Drilling. If symptoms do not improve or worsen, please contact office for sooner follow up or seek emergency care. ? ? ?

## 2021-09-13 NOTE — ED Triage Notes (Signed)
Pt was sent by physician after having CT of her chest today which showed pulmonary emboli.  Pt has been taking prednisone this week for cough/shob and possible pna.  ?

## 2021-09-13 NOTE — Assessment & Plan Note (Signed)
Recent fevers (T max 102) with headaches, mild nasal congestion and cough. COVID and flu negative at PCP. PCP was concerned for possible early pneumonia and empirically placed pt on augmentin and z pack courses. These symptoms have mostly improved. CXR without consolidation or opacities but did have small left pleural effusion.  ?

## 2021-09-13 NOTE — Addendum Note (Signed)
Addended by: Clayton Bibles on: 09/13/2021 01:34 PM ? ? Modules accepted: Orders ? ?

## 2021-09-13 NOTE — Telephone Encounter (Signed)
Called answering service ? What to do with pred based on CT chest results still pending  ? ?Rec complete the taper she's presently using  ?

## 2021-09-14 ENCOUNTER — Observation Stay (HOSPITAL_BASED_OUTPATIENT_CLINIC_OR_DEPARTMENT_OTHER): Payer: Medicare HMO

## 2021-09-14 ENCOUNTER — Encounter (HOSPITAL_COMMUNITY): Payer: Self-pay | Admitting: Internal Medicine

## 2021-09-14 DIAGNOSIS — I2699 Other pulmonary embolism without acute cor pulmonale: Secondary | ICD-10-CM

## 2021-09-14 DIAGNOSIS — E669 Obesity, unspecified: Secondary | ICD-10-CM | POA: Diagnosis not present

## 2021-09-14 DIAGNOSIS — J189 Pneumonia, unspecified organism: Secondary | ICD-10-CM

## 2021-09-14 DIAGNOSIS — E1169 Type 2 diabetes mellitus with other specified complication: Secondary | ICD-10-CM | POA: Diagnosis not present

## 2021-09-14 DIAGNOSIS — I10 Essential (primary) hypertension: Secondary | ICD-10-CM

## 2021-09-14 DIAGNOSIS — Z952 Presence of prosthetic heart valve: Secondary | ICD-10-CM

## 2021-09-14 DIAGNOSIS — I2609 Other pulmonary embolism with acute cor pulmonale: Secondary | ICD-10-CM

## 2021-09-14 LAB — CBC WITH DIFFERENTIAL/PLATELET
Abs Immature Granulocytes: 0.03 10*3/uL (ref 0.00–0.07)
Basophils Absolute: 0 10*3/uL (ref 0.0–0.1)
Basophils Relative: 0 %
Eosinophils Absolute: 0 10*3/uL (ref 0.0–0.5)
Eosinophils Relative: 0 %
HCT: 38.9 % (ref 36.0–46.0)
Hemoglobin: 12.9 g/dL (ref 12.0–15.0)
Immature Granulocytes: 1 %
Lymphocytes Relative: 12 %
Lymphs Abs: 0.5 10*3/uL — ABNORMAL LOW (ref 0.7–4.0)
MCH: 30.4 pg (ref 26.0–34.0)
MCHC: 33.2 g/dL (ref 30.0–36.0)
MCV: 91.5 fL (ref 80.0–100.0)
Monocytes Absolute: 0.1 10*3/uL (ref 0.1–1.0)
Monocytes Relative: 2 %
Neutro Abs: 3.3 10*3/uL (ref 1.7–7.7)
Neutrophils Relative %: 85 %
Platelets: 130 10*3/uL — ABNORMAL LOW (ref 150–400)
RBC: 4.25 MIL/uL (ref 3.87–5.11)
RDW: 14.3 % (ref 11.5–15.5)
WBC: 3.9 10*3/uL — ABNORMAL LOW (ref 4.0–10.5)
nRBC: 0 % (ref 0.0–0.2)

## 2021-09-14 LAB — ECHOCARDIOGRAM COMPLETE
AR max vel: 0.87 cm2
AV Area VTI: 0.92 cm2
AV Area mean vel: 0.82 cm2
AV Mean grad: 13 mmHg
AV Peak grad: 19 mmHg
Ao pk vel: 2.18 m/s
Area-P 1/2: 3.6 cm2
Calc EF: 64.7 %
Height: 63 in
MV M vel: 5.02 m/s
MV Peak grad: 100.8 mmHg
S' Lateral: 2.6 cm
Single Plane A2C EF: 66.1 %
Single Plane A4C EF: 63.1 %
Weight: 3600 oz

## 2021-09-14 LAB — COMPREHENSIVE METABOLIC PANEL
ALT: 28 U/L (ref 0–44)
AST: 25 U/L (ref 15–41)
Albumin: 3.3 g/dL — ABNORMAL LOW (ref 3.5–5.0)
Alkaline Phosphatase: 48 U/L (ref 38–126)
Anion gap: 10 (ref 5–15)
BUN: 30 mg/dL — ABNORMAL HIGH (ref 8–23)
CO2: 23 mmol/L (ref 22–32)
Calcium: 9.3 mg/dL (ref 8.9–10.3)
Chloride: 104 mmol/L (ref 98–111)
Creatinine, Ser: 0.92 mg/dL (ref 0.44–1.00)
GFR, Estimated: >60 mL/min (ref >60)
Glucose, Bld: 310 mg/dL — ABNORMAL HIGH (ref 70–99)
Potassium: 4 mmol/L (ref 3.5–5.1)
Sodium: 137 mmol/L (ref 135–145)
Total Bilirubin: 0.6 mg/dL (ref 0.3–1.2)
Total Protein: 6.3 g/dL — ABNORMAL LOW (ref 6.5–8.1)

## 2021-09-14 LAB — RESP PANEL BY RT-PCR (FLU A&B, COVID) ARPGX2
Influenza A by PCR: NEGATIVE
Influenza B by PCR: NEGATIVE
SARS Coronavirus 2 by RT PCR: NEGATIVE

## 2021-09-14 LAB — GLUCOSE, CAPILLARY
Glucose-Capillary: 258 mg/dL — ABNORMAL HIGH (ref 70–99)
Glucose-Capillary: 232 mg/dL — ABNORMAL HIGH (ref 70–99)

## 2021-09-14 LAB — CBC
HCT: 40.4 % (ref 36.0–46.0)
Hemoglobin: 13.4 g/dL (ref 12.0–15.0)
MCH: 30.2 pg (ref 26.0–34.0)
MCHC: 33.2 g/dL (ref 30.0–36.0)
MCV: 91.2 fL (ref 80.0–100.0)
Platelets: 145 10*3/uL — ABNORMAL LOW (ref 150–400)
RBC: 4.43 MIL/uL (ref 3.87–5.11)
RDW: 14.4 % (ref 11.5–15.5)
WBC: 4.6 10*3/uL (ref 4.0–10.5)
nRBC: 0 % (ref 0.0–0.2)

## 2021-09-14 LAB — HEPARIN LEVEL (UNFRACTIONATED): Heparin Unfractionated: 0.14 IU/mL — ABNORMAL LOW (ref 0.30–0.70)

## 2021-09-14 LAB — TROPONIN I (HIGH SENSITIVITY)
Troponin I (High Sensitivity): 5 ng/L (ref <18)
Troponin I (High Sensitivity): 5 ng/L (ref <18)

## 2021-09-14 LAB — PROCALCITONIN: Procalcitonin: 1.82 ng/mL

## 2021-09-14 MED ORDER — APIXABAN (ELIQUIS) VTE STARTER PACK (10MG AND 5MG): ORAL_TABLET | ORAL | 0 refills | Status: DC

## 2021-09-14 MED ORDER — GUAIFENESIN ER 600 MG PO TB12: 600.0000 mg | ORAL_TABLET | Freq: Every day | ORAL | Status: DC

## 2021-09-14 MED ORDER — APIXABAN 5 MG PO TABS
5.0000 mg | ORAL_TABLET | Freq: Two times a day (BID) | ORAL | 1 refills | Status: DC
Start: 2021-10-14 — End: 2021-09-14

## 2021-09-14 MED ORDER — LORATADINE 10 MG PO TABS
10.0000 mg | ORAL_TABLET | Freq: Every day | ORAL | Status: DC
  Administered 2021-09-14: 10 mg via ORAL
  Filled 2021-09-14: qty 1

## 2021-09-14 MED ORDER — INSULIN ASPART 100 UNIT/ML IJ SOLN
0.0000 [IU] | Freq: Three times a day (TID) | INTRAMUSCULAR | Status: DC
  Administered 2021-09-14: 3 [IU] via SUBCUTANEOUS
  Administered 2021-09-14: 5 [IU] via SUBCUTANEOUS
  Filled 2021-09-14: qty 0.09

## 2021-09-14 MED ORDER — ACETAMINOPHEN 650 MG RE SUPP: 650.0000 mg | Freq: Four times a day (QID) | RECTAL | Status: DC | PRN

## 2021-09-14 MED ORDER — ALBUTEROL SULFATE (2.5 MG/3ML) 0.083% IN NEBU: 2.5000 mg | INHALATION_SOLUTION | Freq: Four times a day (QID) | RESPIRATORY_TRACT | Status: DC | PRN

## 2021-09-14 MED ORDER — HEPARIN (PORCINE) 25000 UT/250ML-% IV SOLN
1550.0000 [IU]/h | INTRAVENOUS | Status: DC
  Administered 2021-09-14: 1550 [IU]/h via INTRAVENOUS
  Filled 2021-09-14: qty 250

## 2021-09-14 MED ORDER — BISOPROLOL-HYDROCHLOROTHIAZIDE 10-6.25 MG PO TABS
1.0000 | ORAL_TABLET | Freq: Every day | ORAL | Status: DC
  Administered 2021-09-14: 1 via ORAL
  Filled 2021-09-14: qty 1

## 2021-09-14 MED ORDER — MONTELUKAST SODIUM 10 MG PO TABS: 10.0000 mg | ORAL_TABLET | Freq: Every day | ORAL | Status: DC

## 2021-09-14 MED ORDER — APIXABAN 5 MG PO TABS
10.0000 mg | ORAL_TABLET | Freq: Two times a day (BID) | ORAL | Status: DC
  Administered 2021-09-14: 10 mg via ORAL
  Filled 2021-09-14: qty 2

## 2021-09-14 MED ORDER — SIMVASTATIN 10 MG PO TABS: 20.0000 mg | ORAL_TABLET | Freq: Every day | ORAL | Status: DC

## 2021-09-14 MED ORDER — APIXABAN 5 MG PO TABS: 5.0000 mg | ORAL_TABLET | Freq: Two times a day (BID) | ORAL | 1 refills | Status: DC

## 2021-09-14 MED ORDER — ACETAMINOPHEN 325 MG PO TABS: 650.0000 mg | ORAL_TABLET | Freq: Four times a day (QID) | ORAL | Status: DC | PRN

## 2021-09-14 MED ORDER — APIXABAN 5 MG PO TABS
5.0000 mg | ORAL_TABLET | Freq: Two times a day (BID) | ORAL | Status: DC
Start: 2021-09-21 — End: 2021-09-14

## 2021-09-14 MED ORDER — UMECLIDINIUM-VILANTEROL 62.5-25 MCG/ACT IN AEPB
1.0000 | INHALATION_SPRAY | Freq: Every day | RESPIRATORY_TRACT | Status: DC
  Administered 2021-09-14: 1 via RESPIRATORY_TRACT
  Filled 2021-09-14: qty 14

## 2021-09-14 MED ORDER — AZITHROMYCIN 250 MG PO TABS: 500.0000 mg | ORAL_TABLET | Freq: Every day | ORAL | Status: DC

## 2021-09-14 NOTE — Progress Notes (Signed)
SATURATION QUALIFICATIONS: (This note is used to comply with regulatory documentation for home oxygen) ? ?Patient Saturations on Room Air at Rest = 99% ? ?Patient Saturations on Room Air while Ambulating = 88% ? ? ?

## 2021-09-14 NOTE — Discharge Summary (Signed)
? ?Physician Discharge Summary  ?Shannon Schultz GDJ:[id number] DOB: 1946-04-05 DOA: 09/13/2021 ? ?PCP: [name], MD ? ?Admit date: 09/13/2021 ?Discharge date: 09/14/2021 ?Admitted From: Home ?Disposition: Home ?Recommendations for Outpatient Follow-up:  ?Follow ups as below. ?Please obtain CBC/BMP/Mag at follow up ?Please follow up on the following pending results: None ? ?Home Health: Not indicated ?Equipment/Devices: Not indicated ? ?Discharge Condition: Stable ?CODE STATUS: Full code ? Follow-up Information   ? ? [name], MD. Schedule an appointment as soon as possible for a visit in 1 week(s).   ?Specialty: Family Medicine ?Contact information: ?[address] ?[place] [address] ?[phone] ? ? ?  ?  ? ?  ?  ? ?  ? ? ?Hospital course ?76 year old F with PMH of diastolic CHF, HTN, breast cancer in remission, COPD, chronic hypoxic RF on 2 L at night and TAVR on [facility] presented to her pulmonologist with worsening shortness of breath and had a CT chest that showed bilateral pulmonary embolism, and she was directed to ED.  Reportedly had shortness of breath, productive cough and fever for 3 days prior to presentation.  She was also seen by PCP and started on Augmentin and azithromycin for community-acquired pneumonia.  In ED, she was hemodynamically stable.  She did not require supplemental oxygen.  She was started on IV heparin.  Echocardiogram basically normal.  Lower extremity venous Doppler negative for DVT. ? ?She was transitioned to p.o. Eliquis later in the day.  She was ambulated and maintained appropriate saturation on room air without respiratory distress.  She was discharged on starter pack Eliquis.  ? ?Patient's procalcitonin was elevated as well.  Advised to continue p.o. Augmentin and azithromycin for underlying pneumonia.  ? ?  ? ?See individual problem list below for more on hospital course. ? ?Problems addressed during this hospitalization ?Problem  ?Pulmonary Embolism (Hcc)  ?Diabetes  Mellitus Type 2 in Obese (Hcc)  ?Cap (Community Acquired Pneumonia)  ?S/P Tavr (Transcatheter Aortic Valve Replacement)  ? s/p TAVR with a 26 mm Edwards S3U via the left subclavian approach by Dr. Roxy Manns and Dr. Angelena Form ?  ?Copd, Group B, By Girtha Rm 2017 Classification (Hcc)  ?Essential Hypertension, Benign  ?  ?Assessment and Plan: ?* Pulmonary embolism ([place]) ?Segmental and subsegmental bilateral PE noted on CTA chest.  No signs of heart strain on CTA and echocardiogram.  PE is likely unprovoked.  Hemodynamically stable.  Maintain appropriate saturation with ambulation on room air. ?-Transitioned to starter pack Eliquis and discharged for outpatient follow-up. ?-Recommend age-appropriate cancer screening ? ?Diabetes mellitus type 2 in obese [facility]) ?Controlled diabetes with hyperglycemia.  A1c 6.2%.  Hyperglycemia likely due to steroid she received for CTA ?Recent Labs  ?Lab 09/14/21 ?0729 09/14/21 ?1149  ?GLUCAP 258* 232*  ?-Continue her metformin, Ozempic ?-Consider statin if no history of intolerance. ? ? ?CAP (community acquired pneumonia) ?Patient had productive cough with shortness of breath and subjective fever.  CTA chest also showed moderate consolidation of the posterior inferior LLL concerning for pneumonia or pulmonary infarct.  Procalcitonin elevated.  Received IV ceftriaxone in house ?-Patient to continue home Augmentin and azithromycin she recently started. ? ?S/P TAVR (transcatheter aortic valve replacement) ?Normal structure and function on echocardiogram. ? ?COPD, group B, by GOLD 2017 classification ([place]) ?Stable.  Continue home inhalers. ? ?Essential hypertension, benign ?She was normotensive for most part but elevated prior to discharge likely from anxiety ?-Continue home medications ? ? ? ? ?  ?  ?  ?  ? ?  ? ?  Vital signs ?Vitals:  ? 09/14/21 0728 09/14/21 0733 09/14/21 1153 09/14/21 1532  ?BP: (!) 154/75  (!) 169/93 (!) 176/101  ?Pulse: 64  65 64  ?Temp: 97.7 ?F (36.5 ?C)  97.6 ?F (36.4 ?C) 97.6 ?F  (36.4 ?C)  ?Resp: '17 18 17 18  '$ ?SpO2: 97% 95% 98% 97%  ?TempSrc: Oral  Oral   ?  ? ?Discharge exam ? ?GENERAL: No apparent distress.  Nontoxic. ?HEENT: MMM.  Vision and hearing grossly intact.  ?NECK: Supple.  No apparent JVD.  ?RESP:  No IWOB.  Fair aeration bilaterally. ?CVS:  RRR. Heart sounds normal.  ?ABD/GI/GU: BS+. Abd soft, NTND.  ?MSK/EXT:  Moves extremities. No apparent deformity. No edema.  Varicose veins. ?SKIN: no apparent skin lesion or wound ?NEURO: Awake and alert. Oriented appropriately.  No apparent focal neuro deficit. ?PSYCH: Calm. Normal affect.  ? ?Discharge Instructions ?Discharge Instructions   ? ? Call MD for:  difficulty breathing, headache or visual disturbances   Complete by: As directed ?  ? Call MD for:  extreme fatigue   Complete by: As directed ?  ? Call MD for:  persistant dizziness or light-headedness   Complete by: As directed ?  ? Diet - low sodium heart healthy   Complete by: As directed ?  ? Diet Carb Modified   Complete by: As directed ?  ? Discharge instructions   Complete by: As directed ?  ? It has been a pleasure taking care of you! ? ?You were hospitalized due to pulmonary embolism (blood clot in your lungs) and possible pneumonia (lung infection).  We have started you on blood thinner for blood clot.  You may continue your antibiotics (Augmentin and azithromycin) for possible pneumonia (lung infection).  We strongly recommend not taking over-the-counter pain medication other than plain Tylenol while taking blood thinner.  We have also stopped your aspirin. ?Please review your new medication list and the directions on your medications before you take them. ? ?Follow-up with your primary care doctor in 1 to 2 weeks or sooner if needed. ? ? ?Take care,  ? Increase activity slowly   Complete by: As directed ?  ? ?  ? ?Allergies as of 09/14/2021   ? ?   Reactions  ? Contrast Media [iodinated Contrast Media] Anaphylaxis, Shortness Of Breath, Other (See Comments)  ? Could not  breath  ? Iohexol Anaphylaxis, Shortness Of Breath, Other (See Comments)  ? Immediately could not breathe  ? Lisinopril Anaphylaxis, Shortness Of Breath, Rash  ? Sulfa Antibiotics Anaphylaxis, Other (See Comments)  ? Historical from mother, pt states that mother says she almost died from this drug  ? Latex Other (See Comments)  ? Unless against on skin for a long time, blisters. Short term is okay.  ? Codeine Nausea Only, Anxiety, Other (See Comments)  ? insomnia  ? Levofloxacin Other (See Comments)  ? insomnia  ? Lipitor [atorvastatin Calcium] Rash  ? Meloxicam Rash  ? Broke out in a rash on her stomach,back, legs, and behind ears.  ? Nickel Other (See Comments)  ? With earrings pt has soreness and drainage from piercing  ? Vicodin [hydrocodone-acetaminophen] Itching  ? ?  ? ?  ?Medication List  ?  ? ?STOP taking these medications   ? ?aspirin EC 81 MG tablet ?  ?predniSONE 10 MG (21) Tbpk tablet ?Commonly known as: STERAPRED UNI-PAK 21 TAB ?  ?predniSONE 50 MG tablet ?Commonly known as: DELTASONE ?  ? ?  ? ?TAKE these  medications   ? ?acetaminophen 500 MG tablet ?Commonly known as: TYLENOL ?Take 500-1,000 mg by mouth every 6 (six) hours as needed for moderate pain or headache. ?  ?albuterol 108 (90 Base) MCG/ACT inhaler ?Commonly known as: VENTOLIN HFA ?Inhale 2 puffs into the lungs every 6 (six) hours as needed for wheezing or shortness of breath. ?  ?amoxicillin-clavulanate 875-125 MG tablet ?Commonly known as: AUGMENTIN ?Take 1 tablet by mouth 2 (two) times daily. ?  ?Apixaban Starter Pack ('10mg'$  and '5mg'$ ) ?Commonly known as: ELIQUIS STARTER PACK ?Take as directed on package: start with two-'5mg'$  tablets twice daily for 7 days. On day 8, switch to one-'5mg'$  tablet twice daily. ?  ?apixaban 5 MG Tabs tablet ?Commonly known as: ELIQUIS ?Take 1 tablet (5 mg total) by mouth 2 (two) times daily. Start after you finish starter pack ?Start taking on: October 14, 2021 ?  ?azithromycin 250 MG tablet ?Commonly known as:  ZITHROMAX ?Take 2 tablets by mouth on first day, then 1 tablet by mouth on days 2 through 5 ?  ?B-D SINGLE USE SWABS REGULAR Pads ?Use twice a day when checking blood sugars ?  ?bisoprolol-hydrochlorothiazide 10-6

## 2021-09-14 NOTE — Progress Notes (Signed)
Lower extremity venous has been completed.  ? ?Preliminary results in CV Proc.  ? ?Shannon Schultz [name] ?09/14/2021 11:54 AM    ?

## 2021-09-14 NOTE — Progress Notes (Signed)
PHARMACY NOTE -  Eliquis ? ?Pharmacy has been assisting with dosing of Eliquis for new PE. ?Dosage remains stable at 10 mg bid x 7d, followed by 5 mg bid thereafter ? ?Pharmacy will sign off, following peripherally for culture results or dose adjustments. Please reconsult if a change in clinical status warrants re-evaluation of dosage. ? ?Reuel Boom, PharmD, BCPS ?[phone] ?09/14/2021, 1:09 PM ? ? ? ?

## 2021-09-14 NOTE — Procedures (Shared)
Pt arrived to the floor alert x4. ? ?

## 2021-09-14 NOTE — Progress Notes (Signed)
? ?  Echocardiogram ?2D Echocardiogram has been performed. ? ?Shannon Schultz ?09/14/2021, 11:26 AM ?

## 2021-09-14 NOTE — H&P (Signed)
?History and Physical  ? ? ?Shannon Schultz JXB:[id number] DOB: 14-May-1946 DOA: 09/13/2021 ? ?PCP: [name], MD  ?Patient coming from: Home. ? ?Chief Complaint: Shortness of breath. ? ?HPI: Shannon Schultz is a 76 y.o. female with history of TAVR, diastolic CHF, hypertension, breast cancer in remission, hyperlipidemia COPD has been experiencing shortness of breath.  Patient had fever with productive cough about 3 days ago when patient's primary care physician call in antibiotics.  Over the last 2 days patient has been becoming more short of breath and decided to go to her pulmonologist.  Who ordered CT scan after preparing patient with prednisone for IV contrast dye allergy.  CT scan showed bilateral pulm embolism was referred to ER. ? ?ED Course: In the ER patient is hemodynamically stable.  EKG shows sinus rhythm with IVCD platelets are around 143 patient has chronic thrombocytopenia glucose was 347 patient did take some prednisone today for the IV contrast injection because patient has allergy.  COVID test negative patient started on heparin.  Since there was concern for possible infarct versus pneumonia ER patient also started patient on antibiotics. ? ?Review of Systems: As per HPI, rest all negative. ? ? ?Past Medical History:  ?Diagnosis Date  ? Arthritis   ? "hands" (05/15/2017)  ? Bladder cancer ([place]) 2018  ? Breast cancer, right ([place]) 1992  ? DCIS,bladder ca (just dx)  ? Colon polyp   ? Complication of anesthesia 1992  ? "local anesthesia" used was hard to awaken from-no problems since (05/15/2017)  ? COPD (chronic obstructive pulmonary disease) ([place])   ? Coronary artery disease   ? COVID-19 virus infection 05/05/2019  ? Diverticulosis   ? Elevated liver enzymes   ? fatty liver per ultrasound per pt  ? FHx: BRCA2 gene positive   ? sister with BRCA2 mutation (pt tested NEGATIVE)  ? HLD (hyperlipidemia)   ? HSV (herpes simplex virus) infection   ? on hip--on daily suppression  ? Hypertension   ? Hypothyroidism    ? took med 7 yrs after birth of 1st child  ? Impaired glucose tolerance   ? Migraine   ? On home oxygen therapy   ? "have it available but I'm not using it" (05/15/2017)  ? Osteopenia   ? S/P TAVR (transcatheter aortic valve replacement) 08/14/2020  ? s/p TAVR with a 26 mm Edwards S3U via the left subclavian approach by Dr. Roxy Manns and Dr. Angelena Form  ? Severe aortic stenosis   ? Sleep apnea   ? Sleeps with 2 L O2 @ night  ? Type 2 diabetes mellitus ([place])   ? Ventral hernia   ? Vitamin D deficiency disease   ? ? ?Past Surgical History:  ?Procedure Laterality Date  ? ABDOMINAL HERNIA REPAIR  05/15/2017  ? BREAST BIOPSY Right 1992  ? BREAST BIOPSY Left 2018  ? BREAST EXCISIONAL BIOPSY Left 2018  ? ATYPICAL DUCTAL HYPERPLASIA INVOLVING A COMPLEX  ? BREAST LUMPECTOMY WITH RADIOACTIVE SEED LOCALIZATION Left 12/22/2016  ? Procedure: LEFT BREAST LUMPECTOMY WITH RADIOACTIVE SEED LOCALIZATION;  Surgeon: Jovita Kussmaul, MD;  Location: [facility];  Service: General;  Laterality: Left;  ? CARDIAC CATHETERIZATION    ? CATARACT EXTRACTION, BILATERAL Bilateral R 03/16/2019 L 03/30/2019  ? Dr. Kathlen Mody  ? COLONOSCOPY  2009, 08/2010, 12/2015  ? Dr. Collene Mares; "only 1 had any polyps" (05/15/2017)  ? CYSTOSCOPY  04/23/2017  ? CYSTOSCOPY W/ RETROGRADES Bilateral 01/12/2017  ? Procedure: CYSTOSCOPY WITH RETROGRADE PYELOGRAM/ EXAM UNDER ANESTHESIA;  Surgeon: Alinda Money,  Wynetta Emery, MD;  Location: [facility];  Service: Urology;  Laterality: Bilateral;  ? EYE SURGERY    ? FEMUR IM NAIL Right 08/24/2019  ? Procedure: INTRAMEDULLARY (IM) NAIL FEMORAL;  Surgeon: Rod Can, MD;  Location: [facility];  Service: Orthopedics;  Laterality: Right;  ? HERNIA REPAIR    ? INSERTION OF MESH N/A 05/15/2017  ? Procedure: INSERTION OF MESH;  Surgeon: Jovita Kussmaul, MD;  Location: [facility];  Service: General;  Laterality: N/A;  ? LAPAROSCOPIC CHOLECYSTECTOMY  2003  ? MASTECTOMY Right 1992  ? RIGHT/LEFT HEART CATH AND CORONARY ANGIOGRAPHY N/A 07/05/2020  ? Procedure: RIGHT/LEFT HEART CATH AND  CORONARY ANGIOGRAPHY;  Surgeon: Burnell Blanks, MD;  Location: [facility];  Service: Cardiovascular;  Laterality: N/A;  ? TEE WITHOUT CARDIOVERSION N/A 08/14/2020  ? Procedure: TRANSESOPHAGEAL ECHOCARDIOGRAM (TEE);  Surgeon: Burnell Blanks, MD;  Location: [place];  Service: Open Heart Surgery;  Laterality: N/A;  ? TRANSURETHRAL RESECTION OF BLADDER TUMOR WITH MITOMYCIN-C N/A 01/12/2017  ? Procedure: TRANSURETHRAL RESECTION OF BLADDER TUMOR WITH POSSIBLE POST OPERATIVE INSTILLATION OF MITOMYCIN-C;  Surgeon: Raynelle Bring, MD;  Location: [facility];  Service: Urology;  Laterality: N/A;  ? TYMPANOSTOMY TUBE PLACEMENT Bilateral 1980s  ? UMBILICAL HERNIA REPAIR  2003  ? VENTRAL HERNIA REPAIR N/A 05/15/2017  ? Procedure: VENTRAL HERNIA REPAIR WITH MESH;  Surgeon: Jovita Kussmaul, MD;  Location: [place];  Service: General;  Laterality: N/A;  ? ? ? reports that she quit smoking about 10 years ago. Her smoking use included cigarettes. She has a 46.00 pack-year smoking history. She has never used smokeless tobacco. She reports that she does not drink alcohol and does not use drugs. ? ?Allergies  ?Allergen Reactions  ? Contrast Media [Iodinated Contrast Media] Anaphylaxis, Shortness Of Breath and Other (See Comments)  ?  Could not breath  ? Iohexol Anaphylaxis, Shortness Of Breath and Other (See Comments)  ?  Immediately could not breathe  ? Lisinopril Anaphylaxis, Shortness Of Breath and Rash  ? Sulfa Antibiotics Anaphylaxis and Other (See Comments)  ?  Historical from mother, pt states that mother says she almost died from this drug  ? Latex Other (See Comments)  ?  Unless against on skin for a long time, blisters. Short term is okay.  ? Codeine Nausea Only, Anxiety and Other (See Comments)  ?  insomnia  ? Levofloxacin Other (See Comments)  ?  insomnia  ? Lipitor [Atorvastatin Calcium] Rash  ? Meloxicam Rash  ?  Broke out in a rash on her stomach,back, legs, and behind ears.  ? Nickel Other (See Comments)  ?   With earrings pt has soreness and drainage from piercing  ? Vicodin [Hydrocodone-Acetaminophen] Itching  ? ? ?Family History  ?Problem Relation Age of Onset  ? Heart disease Father   ? Diabetes Father   ? Hypertension Father   ? Asthma Sister   ? Allergies Sister   ? Hyperlipidemia Sister   ? Hashimoto's thyroiditis Sister   ? Fibromyalgia Sister   ? Heart disease Mother   ?     tachycardia  ? Asthma Sister   ? Allergies Sister   ? Hyperlipidemia Sister   ? Hashimoto's thyroiditis Sister   ? Fibromyalgia Sister   ? Breast cancer Sister 50  ?     BRCA2 positive; metastatic to bones at age 65, then spread to liver  ? Cancer Other   ?     female cancers, bone cancer  ? Cancer  Maternal Grandmother   ?     deceased 52; unk. primary; possibly stomach  ? Tuberculosis Maternal Grandfather   ? Stroke Paternal Grandmother 56  ?     died of cerebral hemorrhage  ? ? ?Prior to Admission medications   ?Medication Sig Start Date End Date Taking? Authorizing Provider  ?acetaminophen (TYLENOL) 500 MG tablet Take 500-1,000 mg by mouth every 6 (six) hours as needed for moderate pain or headache.   Yes [provider]  ?albuterol (VENTOLIN HFA) 108 (90 Base) MCG/ACT inhaler Inhale 2 puffs into the lungs every 6 (six) hours as needed for wheezing or shortness of breath. 07/10/21  Yes [name], MD  ?amoxicillin-clavulanate (AUGMENTIN) 875-125 MG tablet Take 1 tablet by mouth 2 (two) times daily. 09/11/21  Yes [name], MD  ?aspirin EC 81 MG tablet Take 81 mg by mouth every evening. Swallow whole.   Yes [provider]  ?azithromycin (ZITHROMAX) 250 MG tablet Take 2 tablets by mouth on first day, then 1 tablet by mouth on days 2 through 5 09/11/21  Yes [name], MD  ?bisoprolol-hydrochlorothiazide [facility]) 10-6.25 MG tablet TAKE 1 TABLET EVERY DAY 05/14/21  Yes [name], MD  ?CALCIUM ACETATE-MAGNESIUM CARB PO Take 2 tablets by mouth daily.   Yes [provider]  ?cetirizine (ZYRTEC) 10 MG tablet Take  10 mg by mouth daily.   Yes [provider]  ?clobetasol ointment (TEMOVATE) 2.25 % 1 application. once a week. 07/25/21  Yes [provider]  ?furosemide (LASIX) 20 MG tablet Take 1

## 2021-09-14 NOTE — ED Provider Notes (Signed)
?[facility] DEPT ?Provider Note ? ? ?CSN: [id number] ?Arrival date & time: 09/13/21  2150 ? ?  ? ?History ? ?Chief Complaint  ?Patient presents with  ? Pulmonary Emboli  ? ? ?Shannon Schultz is a 76 y.o. female. ? ?HPI ? ?Patient with medical history including hypertension, COPD, type 2 diabetes, aortic valve replacement, presents with chief complaint of PE.  Patient states that she went to her pulmonologist today where they did a CT scan of the chest showed that she had a blood clot in her lungs.  She states starting on Monday she was not feeling well,  having fevers chills congestion and a productive cough, she also felt short of breath but denies any pleuritic chest pain or having actual chest pain.  She denies any leg swelling or calf tenderness, no recent travels, no long immobilization, she has no history of PEs or DVTs she is not current on hormone therapy.  Patient notes that yesterday she had some hemoptysis but has not had any since, patient is not immunocompromise, denies any recent sick contacts.  No bleeding disorder no history of internal head bleeds or GI bleeds ? ?I reviewed patient's charts patient saw her primary care doctor on Wednesday she had a influenza and COVID test, she was started on azithromycin as well as Augmentin, she then went to her pulmonologist today where she had a elevated D-dimer CTA of the chest reveals pulmonary embolism noted in the right lower lobe middle lobe and lingular lobes no heart strain present.  Also consolidation of the posterior inferior left lower lobe pneumonia versus pulmonary infarct. ? ?Home Medications ?Prior to Admission medications   ?Medication Sig Start Date End Date Taking? Authorizing Provider  ?acetaminophen (TYLENOL) 500 MG tablet Take 500-1,000 mg by mouth every 6 (six) hours as needed for moderate pain or headache.   Yes [provider]  ?albuterol (VENTOLIN HFA) 108 (90 Base) MCG/ACT inhaler Inhale 2 puffs into  the lungs every 6 (six) hours as needed for wheezing or shortness of breath. 07/10/21  Yes [name], MD  ?amoxicillin-clavulanate (AUGMENTIN) 875-125 MG tablet Take 1 tablet by mouth 2 (two) times daily. 09/11/21  Yes [name], MD  ?aspirin EC 81 MG tablet Take 81 mg by mouth every evening. Swallow whole.   Yes [provider]  ?azithromycin (ZITHROMAX) 250 MG tablet Take 2 tablets by mouth on first day, then 1 tablet by mouth on days 2 through 5 09/11/21  Yes [name], MD  ?bisoprolol-hydrochlorothiazide [facility]) 10-6.25 MG tablet TAKE 1 TABLET EVERY DAY 05/14/21  Yes [name], MD  ?CALCIUM ACETATE-MAGNESIUM CARB PO Take 2 tablets by mouth daily.   Yes [provider]  ?cetirizine (ZYRTEC) 10 MG tablet Take 10 mg by mouth daily.   Yes [provider]  ?clobetasol ointment (TEMOVATE) 6.28 % 1 application. once a week. 07/25/21  Yes [provider]  ?furosemide (LASIX) 20 MG tablet Take 1 tablet (20 mg total) by mouth daily. 04/24/21  Yes Almyra Deforest, PA  ?guaiFENesin (MUCINEX) 600 MG 12 hr tablet Take 600 mg by mouth at bedtime.   Yes [provider]  ?metFORMIN (GLUCOPHAGE-XR) 500 MG 24 hr tablet Take 1 tablet (500 mg total) by mouth daily with breakfast. 05/08/21  Yes [name], MD  ?montelukast (SINGULAIR) 10 MG tablet Take 1 tablet (10 mg total) by mouth at bedtime. 09/02/21  Yes [name], MD  ?predniSONE (DELTASONE) 50 MG tablet Take 1 tablet (50  mg total) by mouth 1 hour prior to CTA scan for 1 dose. 09/13/21 09/14/21 Yes [name], [name], NP  ?predniSONE (STERAPRED UNI-PAK 21 TAB) 10 MG (21) TBPK tablet Take as directed ?Patient taking differently: Take 10-60 mg by mouth as directed. Take 6 tablets on Day 1 ?Take 5 tablets on Day 2 ?Take 4 tablets on Day 3 ?Take 3 tablets on Day 4 ?Take 2 tablets on Day 5 ?Take 1 tablet on Day 6 09/11/21  Yes [name], MD  ?Semaglutide,0.25 or 0.'5MG'$ /DOS, (OZEMPIC, 0.25 OR 0.5 MG/DOSE,) 2 MG/1.5ML SOPN Inject 0.5 mg  into the skin once a week. 08/27/21  Yes [name], MD  ?simvastatin (ZOCOR) 20 MG tablet TAKE 1 TABLET AT BEDTIME 06/25/21  Yes [name], MD  ?tetrahydrozoline 0.05 % ophthalmic solution Place 1 drop into both eyes daily as needed (dry eyes).   Yes [provider]  ?triamcinolone cream (KENALOG) 0.1 % 1 application. daily as needed (rash or redness). 12/07/18  Yes [provider]  ?umeclidinium-vilanterol (ANORO ELLIPTA) 62.5-25 MCG/ACT AEPB Inhale 1 puff into the lungs daily. 09/02/21  Yes [name], MD  ?valACYclovir (VALTREX) 500 MG tablet Take 1 tablet (500 mg total) by mouth daily. 08/15/21  Yes [name], MD  ?Alcohol Swabs (B-D SINGLE USE SWABS REGULAR) PADS Use twice a day when checking blood sugars 12/21/19   [name], MD  ?glucose blood test strip 1 each by Other route daily. Use as instructed 07/22/21   [name], MD  ?Lancets MISC 1 each by Does not apply route daily. 07/22/21   [name], MD  ?   ? ?Allergies    ?Contrast media [iodinated contrast media], Iohexol, Lisinopril, Sulfa antibiotics, Latex, Codeine, Levofloxacin, Lipitor [atorvastatin calcium], Meloxicam, Nickel, and Vicodin [hydrocodone-acetaminophen]   ? ?Review of Systems   ?Review of Systems  ?Constitutional:  Positive for chills and fever.  ?Respiratory:  Positive for cough and shortness of breath.   ?Cardiovascular:  Negative for chest pain.  ?Gastrointestinal:  Negative for abdominal pain, diarrhea, nausea and vomiting.  ?Musculoskeletal:  Negative for myalgias.  ?Neurological:  Negative for headaches.  ? ?Physical Exam ?Updated Vital Signs ?BP (!) 143/63   Pulse 87   Temp 97.6 ?F (36.4 ?C) (Oral)   Resp 18   LMP  (LMP Unknown)   SpO2 96%  ?Physical Exam ?Vitals and nursing note reviewed.  ?Constitutional:   ?   General: She is not in acute distress. ?   Appearance: She is not ill-appearing.  ?HENT:  ?   Head: Normocephalic and atraumatic.  ?   Nose: No congestion.  ?Eyes:  ?   Conjunctiva/sclera:  Conjunctivae normal.  ?Cardiovascular:  ?   Rate and Rhythm: Normal rate and regular rhythm.  ?   Pulses: Normal pulses.  ?   Heart sounds: No murmur heard. ?  No friction rub. No gallop.  ?Pulmonary:  ?   Effort: No respiratory distress.  ?   Breath sounds: No wheezing, rhonchi or rales.  ?Musculoskeletal:  ?   Right lower leg: No edema.  ?   Left lower leg: No edema.  ?   Comments: Right leg slightly larger than the left 2+ dorsal pedal pulses neurovascular fully intact in lower extremities no palpable cords, no calf tenderness  ?Skin: ?   General: Skin is warm and dry.  ?Neurological:  ?   Mental Status: She is alert.  ?Psychiatric:     ?   Mood and Affect: Mood normal.  ? ? ?  ED Results / Procedures / Treatments   ?Labs ?(all labs ordered are listed, but only abnormal results are displayed) ?Labs Reviewed  ?RESP PANEL BY RT-PCR (FLU A&B, COVID) [id number]  ?HEPARIN LEVEL (UNFRACTIONATED)  ?CBC  ? ? ?EKG ?None ? ?Radiology ?DG Chest 2 View ? ?Result Date: 09/13/2021 ?CLINICAL DATA:  Hemoptysis EXAM: CHEST - 2 VIEW COMPARISON:  Previous studies including the examination of 09/11/2021 FINDINGS: Transverse diameter of heart is increased. There is prosthetic stent in the region of aortic valve. There are no signs of alveolar pulmonary edema or focal pulmonary consolidation. Small left pleural effusion is seen. There is evidence of right mastectomy. No significant interval changes are noted. IMPRESSION: Small left pleural effusion. There are no signs of pulmonary edema or new focal infiltrates. Electronically Signed   By: Elmer Picker M.D.   On: 09/13/2021 10:16  ? ?CT Angio Chest Pulmonary Embolism (PE) W or WO Contrast ? ?Addendum Date: 09/13/2021   ?ADDENDUM REPORT: 09/13/2021 19:53 ADDENDUM: Critical Value/emergent results were called by telephone at the time of interpretation on 09/13/2021 at 7:49 pm to on call provider Dr. Mauri Brooklyn, Who verbally acknowledged these results. Electronically Signed   By: Yvonne Kendall M.D.   On: 09/13/2021 19:53  ? ?Result Date: 09/13/2021 ?CLINICAL DATA:  Positive D-dimer. Hemoptysis. Evaluate for pulmonary embolism. EXAM: CT ANGIOGRAPHY CHEST WITH CONTRAST TECHNIQUE: Multidetector CT imagin

## 2021-09-14 NOTE — Discharge Instructions (Signed)
Information on my medicine - ELIQUIS? (apixaban) ? ?Why was Eliquis? prescribed for you? ?Eliquis? was prescribed to treat blood clots that may have been found in the veins of your legs (deep vein thrombosis) or in your lungs (pulmonary embolism) and to reduce the risk of them occurring again. ? ?What do You need to know about Eliquis? ? ?The starting dose is 10 mg (two 5 mg tablets) taken TWICE daily for the FIRST SEVEN (7) DAYS, then on (enter date)  09/21/21  the dose is reduced to ONE 5 mg tablet taken TWICE daily.  Eliquis? may be taken with or without food.  ? ?Try to take the dose about the same time in the morning and in the evening. If you have difficulty swallowing the tablet whole please discuss with your pharmacist how to take the medication safely. ? ?Take Eliquis? exactly as prescribed and DO NOT stop taking Eliquis? without talking to the doctor who prescribed the medication.  Stopping may increase your risk of developing a new blood clot.  Refill your prescription before you run out. ? ?After discharge, you should have regular check-up appointments with your healthcare provider that is prescribing your Eliquis?. ?   ?What do you do if you miss a dose? ?If a dose of ELIQUIS? is not taken at the scheduled time, take it as soon as possible on the same day and twice-daily administration should be resumed. The dose should not be doubled to make up for a missed dose. ? ?Important Safety Information ?A possible side effect of Eliquis? is bleeding. You should call your healthcare provider right away if you experience any of the following: ?Bleeding from an injury or your nose that does not stop. ?Unusual colored urine (red or dark brown) or unusual colored stools (red or black). ?Unusual bruising for unknown reasons. ?A serious fall or if you hit your head (even if there is no bleeding). ? ?Some medicines may interact with Eliquis? and might increase your risk of bleeding or clotting while on Eliquis?Marland Kitchen To  help avoid this, consult your healthcare provider or pharmacist prior to using any new prescription or non-prescription medications, including herbals, vitamins, non-steroidal anti-inflammatory drugs (NSAIDs) and supplements. ? ?This website has more information on Eliquis? (apixaban): http://www.eliquis.com/eliquis/home ? ?

## 2021-09-14 NOTE — Assessment & Plan Note (Addendum)
Controlled diabetes with hyperglycemia.  A1c 6.2%.  Hyperglycemia likely due to steroid she received for CTA ?Recent Labs  ?Lab 09/14/21 ?0729 09/14/21 ?1149  ?GLUCAP 258* 232*  ?-Continue her metformin, Ozempic and Zocor ? ?

## 2021-09-14 NOTE — Progress Notes (Addendum)
ANTICOAGULATION CONSULT NOTE - Pharmacy Consult for Heparin ?Indication: pulmonary embolus ? ?Allergies  ?Allergen Reactions  ? Contrast Media [Iodinated Contrast Media] Anaphylaxis, Shortness Of Breath and Other (See Comments)  ?  Could not breath  ? Iohexol Anaphylaxis, Shortness Of Breath and Other (See Comments)  ?  Immediately could not breathe  ? Lisinopril Anaphylaxis, Shortness Of Breath and Rash  ? Sulfa Antibiotics Anaphylaxis and Other (See Comments)  ?  Historical from mother, pt states that mother says she almost died from this drug  ? Latex Other (See Comments)  ?  Unless against on skin for a long time, blisters. Short term is okay.  ? Codeine Nausea Only, Anxiety and Other (See Comments)  ?  insomnia  ? Levofloxacin Other (See Comments)  ?  insomnia  ? Lipitor [Atorvastatin Calcium] Rash  ? Meloxicam Rash  ?  Broke out in a rash on her stomach,back, legs, and behind ears.  ? Nickel Other (See Comments)  ?  With earrings pt has soreness and drainage from piercing  ? Vicodin [Hydrocodone-Acetaminophen] Itching  ? ? ?Patient Measurements: ?  ?Heparin Dosing Weight: 76 kg ? ?Vital Signs: ?Temp: 97.7 ?F (36.5 ?C) (03/18 7371) ?Temp Source: Oral (03/18 0626) ?BP: 154/75 (03/18 0728) ?Pulse Rate: 64 (03/18 0728) ? ?Labs: ?Recent Labs  ?  09/13/21 ?1102 09/14/21 ?0451 09/14/21 ?1000  ?HGB 13.7 12.9 13.4  ?HCT 39.9 38.9 40.4  ?PLT 143.0* 130* 145*  ?LABPROT 12.4  --   --   ?INR 1.1*  --   --   ?HEPARINUNFRC  --   --  0.14*  ?CREATININE 0.92 0.92  --   ?TROPONINIHS  --  5  --   ? ? ? ?Estimated Creatinine Clearance: 60.3 mL/min (by C-G formula based on SCr of 0.92 mg/dL). ? ?Medications:  ?No oral anticoagulation PTA ? ?Assessment: ?76 yr female sent by physician for admission for + pulmonary embolism seen on CT Angio today.  Patient had seen MD for reports of hemoptysis ?PMH significant for HTN, DM, AF s/p TAVR, h/o breast ancer, bladder cancer, AAA without rupture, cirrhosis of liver ?Baseline INR =  1.1 ? ? ?09/14/2021 ?First heparin level subtherapeutic at 0.14 after heparin started with no bolus at rate of 1250 units/hr ?Hg stable, PLT low @ 145 (thrombocytopenia PTA d/t hx cirrhosis)  ?No bleeding reported, no reports of hemoptysis  ?No reports of IV infiltrating, etc ? ?Goal of Therapy:  ?Heparin level 0.3-0.7 units/ml ?Monitor platelets by anticoagulation protocol: Yes ?  ?Plan:  ?No heparin bolus per Delmer Islam, PA-C due to reported hemoptysis 3/17 ?Increase  heparin gtt to 1550 units/hr ?Check heparin level 8 hr after rate increase ?Daily CBC and heparin level ?Monitor for signs & symptoms of bleeding ?F/u for transition to [place] ?IV azith > PO per protocol ? ?Eudelia Bunch, Pharm.D ?09/14/2021 11:25 AM ? ?

## 2021-09-14 NOTE — Assessment & Plan Note (Signed)
Patient had productive cough with shortness of breath and subjective fever.  CTA chest also showed moderate consolidation of the posterior inferior LLL concerning for pneumonia or pulmonary infarct.  Procalcitonin elevated.  Received IV ceftriaxone in house ?-Patient to continue home Augmentin and azithromycin she recently started. ?

## 2021-09-14 NOTE — Assessment & Plan Note (Signed)
Segmental and subsegmental bilateral PE noted on CTA chest.  No signs of heart strain on CTA and echocardiogram.  PE is likely unprovoked.  Hemodynamically stable.  Maintain appropriate saturation with ambulation on room air. ?-Transitioned to starter pack Eliquis and discharged for outpatient follow-up. ?-Recommend age-appropriate cancer screening ?

## 2021-09-14 NOTE — Hospital Course (Signed)
76 year old F with PMH of diastolic CHF, HTN, breast cancer in remission, COPD, chronic hypoxic RF on 2 L at night and TAVR on [facility] presented to her pulmonologist with worsening shortness of breath and had a CT chest that showed bilateral pulmonary embolism, and she was directed to ED.  Reportedly had shortness of breath, productive cough and fever for 3 days prior to presentation.  She was also seen by PCP and started on Augmentin and azithromycin for community-acquired pneumonia.  In ED, she was hemodynamically stable.  She did not require supplemental oxygen.  She was started on IV heparin.  Echocardiogram basically normal.  Lower extremity venous Doppler negative for DVT. ? ?She was transitioned to p.o. Eliquis later in the day.  She was ambulated and maintained appropriate saturation on room air without respiratory distress.  She was discharged on starter pack Eliquis.  ? ?Patient's procalcitonin was elevated as well.  Advised to continue p.o. Augmentin and azithromycin for underlying pneumonia.  ? ? ?

## 2021-09-14 NOTE — TOC Transition Note (Signed)
Transition of Care (TOC) - CM/SW Discharge Note ? ? ?Patient Details  ?Name: Shannon Schultz ?MRN: [id number] ?Date of Birth: 08-31-1945 ? ?Transition of Care (TOC) CM/SW Contact:  ?Ross Ludwig, LCSW ?Phone Number: ?09/14/2021, 2:23 PM ? ? ?Clinical Narrative:    ? ? ?Transition of Care (TOC) Screening Note ? ? ?Patient Details  ?Name: Shannon Schultz ?Date of Birth: [birth year] ? ? ?Transition of Care (TOC) CM/SW Contact:    ?Ross Ludwig, LCSW ?Phone Number: ?09/14/2021, 2:29 PM ? ? ? ?Transition of Care Department [facility]) has reviewed patient and no TOC needs have been identified at this time. We will continue to monitor patient advancement through interdisciplinary progression rounds. If new patient transition needs arise, please place a TOC consult. ?  ? ? ?  ?  ? ? ?Patient Goals and CMS Choice ?  ?  ?  ? ?Discharge Placement ?  ?           ?  ?  ?  ?  ? ?Discharge Plan and Services ?  ?  ?           ?  ?  ?  ?  ?  ?  ?  ?  ?  ?  ? ?Social Determinants of Health (SDOH) Interventions ?  ? ? ?Readmission Risk Interventions ?No flowsheet data found. ? ? ? ? ?

## 2021-09-14 NOTE — Progress Notes (Signed)
PHARMACY NOTE -  Eliquis ? ?Pharmacy has been assisting with dosing of Eliquis for new PE. ?Dosage remains stable at 10 mg bid x 7d, followed by 5 mg bid thereafter ? ?Pharmacy will sign off, following peripherally for dose adjustments. Please reconsult if a change in clinical status warrants re-evaluation of dosage. ? ?Reuel Boom, PharmD, BCPS ?[phone] ?09/14/2021, 1:10 PM ? ? ? ?

## 2021-09-14 NOTE — Assessment & Plan Note (Signed)
She was normotensive for most part but elevated prior to discharge likely from anxiety ?-Continue home medications ?

## 2021-09-14 NOTE — Assessment & Plan Note (Signed)
Stable.  Continue home inhalers. ?

## 2021-09-14 NOTE — Assessment & Plan Note (Signed)
Normal structure and function on echocardiogram. ?

## 2021-09-16 ENCOUNTER — Telehealth: Payer: Self-pay

## 2021-09-16 ENCOUNTER — Ambulatory Visit: Payer: Medicare HMO | Admitting: Family Medicine

## 2021-09-16 NOTE — Telephone Encounter (Signed)
Transition Care Management Follow-up Telephone Call ?Date of discharge and from where: [facility] 09/14/21 ?How have you been since you were released from the hospital? fair ?Any questions or concerns? No ? ?Items Reviewed: ?Did the pt receive and understand the discharge instructions provided? Yes  ?Medications obtained and verified? Yes  ?Other? No  ?Any new allergies since your discharge? No  ?Dietary orders reviewed? No ?Do you have support at home? Yes  ? ?Home Care and Equipment/Supplies: ?Were home health services ordered? no ? ?Functional Questionnaire: (I = Independent and D = Dependent) ?ADLs: I ? ?Bathing/Dressing- I ? ?Meal Prep- I ? ?Eating- I ? ?Maintaining continence- I ? ?Transferring/Ambulation- I ? ?Managing Meds- I ? ?Follow up appointments reviewed: ? ?PCP Hospital f/u appt confirmed? Yes  Scheduled to see Dr. Tomi Bamberger on 09/23/21 @ 11:30 a.m.. ?[facility] f/u appt confirmed? Yes  Scheduled to see Pulmonary on 09/30/21 @ 10:30. ?Are transportation arrangements needed? No  ?If their condition worsens, is the pt aware to call PCP or go to the Emergency Dept.? Yes ?Was the patient provided with contact information for the PCP's office or ED? Yes ?Was to pt encouraged to call back with questions or concerns? Yes  ?

## 2021-09-17 DIAGNOSIS — I872 Venous insufficiency (chronic) (peripheral): Secondary | ICD-10-CM | POA: Diagnosis not present

## 2021-09-22 NOTE — Progress Notes (Addendum)
Chief Complaint  ?Patient presents with  ? Follow-up  ?  Hospital follow up. Patient does feel as if she is improving.   ? ? ?Patient presents for hospital follow-up. She was admitted 3/17-3/18/23.   ?She was seen by me (video) 3/15 with fever, shortness of breath and L sided chest pain.  CXR showed new effusion (new from CT 2 days prior), and was started on augmentin and azithromycin to cover for pneumonia (with parapneumonic effusion suspected), and prednisone taper for her COPD.  She then started with some hemoptysis, saw her pulmonary doctor who did labs.  Hemoglobin was stable, but she had elevated D-dimer.  Was sent for CT angio which showed: ?IMPRESSION: ?1. Although there is suboptimal contrast bolus timing, there are ?filling defects within bilateral pulmonary arteries that are ?suspicious for true pulmonary emboli. This includes the segmental ?right lower lobe, middle lobe, and lingular pulmonary arteries as ?well as possibly within left lower lobe and bilateral upper lobe ?segmental pulmonary arteries. No CT evidence of right-sided heart ?strain. ?2. There is moderate consolidation of the posteroinferior left lower ?lobe, concerning for pneumonia or pulmonary infarct. ?3. Mild-to-moderate chronic emphysematous changes. ?4. Nodular contour of the liver again consistent with cirrhosis. ? ?She was sent to the hospital due to bilateral PE.  She had negative lower extremity dopplers.  She was started on IV heparin, transitioned to oral eliquis and sent home.  Aspirin was stopped. She did not require any supplemental oxygen, no desaturation with ambulation.  Procalcitonin was elevated, so was advised to complete the antibiotic course for underlying pneumonia. ?Echo showed normal EF, mild concentric left ventricular hypertrophy, grade I diastolic dysfunction, mildly dilated L atrium.  Aortic valve prosthesis is normal. ? ?Today patient reports she finished her antibiotics yesterday.  They gave her diarrhea, and  bowels improved by yesterday afternoon. ?No further fever.  Breathing is back to her baseline, using oxygen at night, per usual. ?Denies any DOE, just her usual. ?She still has a bit of a cough, but phlegm is clear in color, but thick. ?She uses mucinex every night.  The cough feels more in her throat/upper chest. ?She no longer has any chest wall pain--that had resolved by the time she saw pulmonary on 3/17. ? ?Prednisone was stopped on discharge. Sugar was 145 this morning, 127 the day after discharge from hospital (first day not on prednisone). ? ? ?PMH, PSH, SH reviewed ? ?Outpatient Encounter Medications as of 09/23/2021  ?Medication Sig Note  ? Alcohol Swabs (B-D SINGLE USE SWABS REGULAR) PADS Use twice a day when checking blood sugars   ? [START ON 10/14/2021] apixaban (ELIQUIS) 5 MG TABS tablet Take 1 tablet (5 mg total) by mouth 2 (two) times daily. Start after you finish starter pack   ? bisoprolol-hydrochlorothiazide (ZIAC) 10-6.25 MG tablet TAKE 1 TABLET EVERY DAY   ? CALCIUM ACETATE-MAGNESIUM CARB PO Take 2 tablets by mouth daily.   ? cetirizine (ZYRTEC) 10 MG tablet Take 10 mg by mouth daily.   ? glucose blood test strip 1 each by Other route daily. Use as instructed   ? guaiFENesin (MUCINEX) 600 MG 12 hr tablet Take 600 mg by mouth at bedtime.   ? Lancets MISC 1 each by Does not apply route daily.   ? metFORMIN (GLUCOPHAGE-XR) 500 MG 24 hr tablet Take 1 tablet (500 mg total) by mouth daily with breakfast.   ? montelukast (SINGULAIR) 10 MG tablet Take 1 tablet (10 mg total) by mouth at bedtime.   ?  Semaglutide,0.25 or 0.'5MG'$ /DOS, (OZEMPIC, 0.25 OR 0.5 MG/DOSE,) 2 MG/1.5ML SOPN Inject 0.5 mg into the skin once a week. 09/13/2021: Take on Fridays  ? simvastatin (ZOCOR) 20 MG tablet TAKE 1 TABLET AT BEDTIME   ? umeclidinium-vilanterol (ANORO ELLIPTA) 62.5-25 MCG/ACT AEPB Inhale 1 puff into the lungs daily.   ? valACYclovir (VALTREX) 500 MG tablet Take 1 tablet (500 mg total) by mouth daily.   ? acetaminophen  (TYLENOL) 500 MG tablet Take 500-1,000 mg by mouth every 6 (six) hours as needed for moderate pain or headache. (Patient not taking: Reported on 09/23/2021) 09/23/2021: prn  ? albuterol (VENTOLIN HFA) 108 (90 Base) MCG/ACT inhaler Inhale 2 puffs into the lungs every 6 (six) hours as needed for wheezing or shortness of breath. (Patient not taking: Reported on 09/23/2021) 09/23/2021: prn  ? clobetasol ointment (TEMOVATE) 0.63 % 1 application. once a week. (Patient not taking: Reported on 09/23/2021) 09/23/2021: Using once every 3 weeks  ? furosemide (LASIX) 20 MG tablet Take 1 tablet (20 mg total) by mouth daily. (Patient not taking: Reported on 09/23/2021) 09/23/2021: prn  ? tetrahydrozoline 0.05 % ophthalmic solution Place 1 drop into both eyes daily as needed (dry eyes). (Patient not taking: Reported on 09/23/2021) 09/23/2021: prn  ? triamcinolone cream (KENALOG) 0.1 % 1 application. daily as needed (rash or redness). (Patient not taking: Reported on 09/23/2021) 09/23/2021: Last used Sat  ? [DISCONTINUED] amoxicillin-clavulanate (AUGMENTIN) 875-125 MG tablet Take 1 tablet by mouth 2 (two) times daily. 09/13/2021: ABT Start Date 09/11/21. 10 Day Course  ? [DISCONTINUED] APIXABAN (ELIQUIS) VTE STARTER PACK ('10MG'$  AND '5MG'$ ) Take as directed on package: start with two-'5mg'$  tablets twice daily for 7 days. On day 8, switch to one-'5mg'$  tablet twice daily.   ? [DISCONTINUED] azithromycin (ZITHROMAX) 250 MG tablet Take 2 tablets by mouth on first day, then 1 tablet by mouth on days 2 through 5 09/13/2021: ABT Start Date 09/12/21. 5 Day Course  ? ?No facility-administered encounter medications on file as of 09/23/2021.  ? ?Allergies  ?Allergen Reactions  ? Contrast Media [Iodinated Contrast Media] Anaphylaxis, Shortness Of Breath and Other (See Comments)  ?  Could not breath  ? Iohexol Anaphylaxis, Shortness Of Breath and Other (See Comments)  ?  Immediately could not breathe  ? Lisinopril Anaphylaxis, Shortness Of Breath and Rash  ? Sulfa  Antibiotics Anaphylaxis and Other (See Comments)  ?  Historical from mother, pt states that mother says she almost died from this drug  ? Latex Other (See Comments)  ?  Unless against on skin for a long time, blisters. Short term is okay.  ? Codeine Nausea Only, Anxiety and Other (See Comments)  ?  insomnia  ? Levofloxacin Other (See Comments)  ?  insomnia  ? Lipitor [Atorvastatin Calcium] Rash  ? Meloxicam Rash  ?  Broke out in a rash on her stomach,back, legs, and behind ears.  ? Nickel Other (See Comments)  ?  With earrings pt has soreness and drainage from piercing  ? Vicodin [Hydrocodone-Acetaminophen] Itching  ? ? ?ROS:  no fever, chills, URI symptoms, n/v/d or urinary complaints. No chest pain.  Slight cough, per HPI, clear phlegm. ?Breathing is back at baseline, only using oxygen at night. ?Denies edema. ?She denies any bleeding, bruising. ?Moods are good. ? ? ?PHYSICAL EXAM: ? ?BP 122/72   Pulse 72   Ht '5\' 3"'$  (1.6 m)   Wt 227 lb 3.2 oz (103.1 kg)   LMP  (LMP Unknown)   BMI 40.25 kg/m?  ? ?  Wt Readings from Last 3 Encounters:  ?09/23/21 227 lb 3.2 oz (103.1 kg)  ?09/13/21 225 lb (102.1 kg)  ?09/11/21 229 lb (103.9 kg)  ? ?Pleasant, well-appearing female in no distress ?HEENT: conjunctiva and sclera are clear, EOMI, wearing mask ?Neck: no lymphadenopathy or mass ?Heart: regular rate and rhythm ?Lungs: clear bilaterally.  Fair air movement throughout.  No wheezes, ronchi. ?Extremities: no edema ?Abdomen: nontender ?Psych: normal mood, affect, hygiene and grooming ?Neuro: alert and oriented, normal strength, gait. ? ? ?ASSESSMENT/PLAN: ? ?Bilateral pulmonary embolism (HCC) - The radiologist's reading suggests possible issue with contrast, and affect MANY segments bilat; ?true PE's. I would expect more O2 req/sx. Cont Eliquis ? ?Pneumonia of left lower lobe due to infectious organism - completed course of ABX and clinically improved.  Will need f/u CXR in future, will leave to pulm She sees them Monday   ? ?COPD exacerbation ([place]) - back to baseline.  Has been off prednisone since discharge ? ?Type 2 diabetes mellitus with complications ([place]) - Plan: Basic metabolic panel ? ?Medication monitoring encounter - Plan:

## 2021-09-23 ENCOUNTER — Other Ambulatory Visit: Payer: Self-pay

## 2021-09-23 ENCOUNTER — Encounter: Payer: Self-pay | Admitting: Family Medicine

## 2021-09-23 ENCOUNTER — Ambulatory Visit (INDEPENDENT_AMBULATORY_CARE_PROVIDER_SITE_OTHER): Payer: Medicare HMO | Admitting: Family Medicine

## 2021-09-23 VITALS — BP 122/72 | HR 72 | Ht 63.0 in | Wt 227.2 lb

## 2021-09-23 DIAGNOSIS — Z5181 Encounter for therapeutic drug level monitoring: Secondary | ICD-10-CM

## 2021-09-23 DIAGNOSIS — I2699 Other pulmonary embolism without acute cor pulmonale: Secondary | ICD-10-CM

## 2021-09-23 DIAGNOSIS — K7460 Unspecified cirrhosis of liver: Secondary | ICD-10-CM | POA: Diagnosis not present

## 2021-09-23 DIAGNOSIS — E1169 Type 2 diabetes mellitus with other specified complication: Secondary | ICD-10-CM | POA: Diagnosis not present

## 2021-09-23 DIAGNOSIS — Z7901 Long term (current) use of anticoagulants: Secondary | ICD-10-CM

## 2021-09-23 DIAGNOSIS — J189 Pneumonia, unspecified organism: Secondary | ICD-10-CM

## 2021-09-23 DIAGNOSIS — I700 Atherosclerosis of aorta: Secondary | ICD-10-CM

## 2021-09-23 DIAGNOSIS — J441 Chronic obstructive pulmonary disease with (acute) exacerbation: Secondary | ICD-10-CM

## 2021-09-23 DIAGNOSIS — E118 Type 2 diabetes mellitus with unspecified complications: Secondary | ICD-10-CM

## 2021-09-24 LAB — MAGNESIUM: Magnesium: 2 mg/dL (ref 1.6–2.3)

## 2021-09-24 LAB — CBC WITH DIFFERENTIAL/PLATELET
Basophils Absolute: 0.1 10*3/uL (ref 0.0–0.2)
Basos: 1 %
EOS (ABSOLUTE): 0.1 10*3/uL (ref 0.0–0.4)
Eos: 1 %
Hematocrit: 39.1 % (ref 34.0–46.6)
Hemoglobin: 13.4 g/dL (ref 11.1–15.9)
Immature Grans (Abs): 0 10*3/uL (ref 0.0–0.1)
Immature Granulocytes: 1 %
Lymphocytes Absolute: 1 10*3/uL (ref 0.7–3.1)
Lymphs: 13 %
MCH: 30.5 pg (ref 26.6–33.0)
MCHC: 34.3 g/dL (ref 31.5–35.7)
MCV: 89 fL (ref 79–97)
Monocytes Absolute: 0.4 10*3/uL (ref 0.1–0.9)
Monocytes: 5 %
Neutrophils Absolute: 6.2 10*3/uL (ref 1.4–7.0)
Neutrophils: 79 %
Platelets: 148 10*3/uL — ABNORMAL LOW (ref 150–450)
RBC: 4.4 x10E6/uL (ref 3.77–5.28)
RDW: 13.4 % (ref 11.7–15.4)
WBC: 7.8 10*3/uL (ref 3.4–10.8)

## 2021-09-24 LAB — BASIC METABOLIC PANEL
BUN/Creatinine Ratio: 19 (ref 12–28)
BUN: 12 mg/dL (ref 8–27)
CO2: 27 mmol/L (ref 20–29)
Calcium: 9.6 mg/dL (ref 8.7–10.3)
Chloride: 103 mmol/L (ref 96–106)
Creatinine, Ser: 0.64 mg/dL (ref 0.57–1.00)
Glucose: 142 mg/dL — ABNORMAL HIGH (ref 70–99)
Potassium: 4.5 mmol/L (ref 3.5–5.2)
Sodium: 141 mmol/L (ref 134–144)
eGFR: 92 mL/min/{1.73_m2} (ref >59)

## 2021-09-26 DIAGNOSIS — G4734 Idiopathic sleep related nonobstructive alveolar hypoventilation: Secondary | ICD-10-CM | POA: Diagnosis not present

## 2021-09-30 ENCOUNTER — Ambulatory Visit: Payer: Medicare HMO | Admitting: Pulmonary Disease

## 2021-09-30 ENCOUNTER — Encounter: Payer: Self-pay | Admitting: Pulmonary Disease

## 2021-09-30 ENCOUNTER — Telehealth: Payer: Self-pay | Admitting: *Deleted

## 2021-09-30 VITALS — BP 120/60 | HR 87 | Temp 97.8°F | Ht 63.0 in | Wt 228.2 lb

## 2021-09-30 DIAGNOSIS — I2699 Other pulmonary embolism without acute cor pulmonale: Secondary | ICD-10-CM | POA: Diagnosis not present

## 2021-09-30 DIAGNOSIS — R071 Chest pain on breathing: Secondary | ICD-10-CM | POA: Diagnosis not present

## 2021-09-30 DIAGNOSIS — J449 Chronic obstructive pulmonary disease, unspecified: Secondary | ICD-10-CM

## 2021-09-30 DIAGNOSIS — G4734 Idiopathic sleep related nonobstructive alveolar hypoventilation: Secondary | ICD-10-CM | POA: Diagnosis not present

## 2021-09-30 NOTE — Progress Notes (Signed)
? ? ?Subjective:  ? ?PATIENT ID: Shannon Schultz GENDER: female DOB: May 14, 1946, MRN: [id number] ? ? ?HPI ? ?Chief Complaint  ?Patient presents with  ? Hospitalization Follow-up  ?  Cough with clear mucous.  Sob at baseline.  ? ?[name] is a 76 year old female with allergic rhinitis moderate COPD, HTN, DM2, AS s/p TAVR 07/2020 who presents for follow-up. ? ?Synopsis: ?Diagnosis of COPD well-controlled on Anoro. Last COPD flare requiring antibiotics 05/2021. ? ?09/02/2021 ?Since her last visit she has been compliant with her Anoro and Singulair daily.  She was treated with azithromycin for COPD flare/acute bronchitis in December 2022.  Her last COPD exacerbation for this was 2021. She currently reports mild cough with nasal congestion. Triggered [place] pollen [place] She is on zyrtec and Singulair. Otherwise she reports her symptoms are well-controlled. Denies wheezing. No limitations in activity. Compliant with oxygen at night however would want to discontinue this if she no longer needs it. ? ?09/30/21 ?Since our last visit, she was diagnosed with PE. On the day after her CT chest lung screen, she reports abrupt onset of left sided chest pain. No fevers or cough preceding this. Was seen by PCP and treated with antibiotics. She then developed hemoptysis. After seeing Pulmonary NP CTA was ordered and multiple PE diagnosed however study was suboptimal. Started on Eliquis. Since then she reports improved left chest pain, resolved hemoptysis and improved cough though does complain of upper airway congestion/cough. ? ?Social History: 46 pack years. Quit in 2012. ?During 2018, she lost her sister to breast cancer in early 2018, was diagnosed with breast cancer s/p lumpectomy, had bladder cancer and underwent resection and intravesicular chemo, fell and broke her right shoulder, underwent ventral hernia repair with mesh insertion.  ? ?Patient Active Problem List  ? Diagnosis Date Noted  ? Pulmonary  embolism ([place]) 09/14/2021  ? Hemoptysis 09/13/2021  ? Pleural effusion 09/13/2021  ? Diastolic dysfunction 78/24/2353  ? URI (upper respiratory infection) 09/13/2021  ? Nocturnal hypoxemia 11/12/2020  ? S/P TAVR (transcatheter aortic valve replacement) 08/14/2020  ? Severe aortic stenosis   ? Contrast media allergy 06/26/2020  ? AAA (abdominal aortic aneurysm) without rupture 06/26/2020  ? Upper airway cough syndrome 12/20/2019  ? Hip fracture ([place]) 08/25/2019  ? Venous insufficiency 09/10/2018  ? Sinusitis 06/25/2018  ? Ophthalmic migraine 03/16/2018  ? COPD, group B, by GOLD 2017 classification ([place]) 09/30/2017  ? Chronic hypoxemic respiratory failure ([place]) 09/29/2017  ? Ventral hernia without obstruction or gangrene 05/15/2017  ? Cirrhosis of liver without ascites ([place]) 03/11/2017  ? Urothelial cancer ([place]) 03/10/2017  ? Atherosclerosis of coronary artery 09/14/2016  ? Aortic atherosclerosis ([place]) 09/14/2016  ? Class 2 severe obesity due to excess calories with serious comorbidity and body mass index (BMI) of 39.0 to 39.9 in adult [facility]) 11/10/2015  ? Thrombocytopenia ([place]) 08/17/2015  ? Elevated troponin 08/17/2015  ? Diabetes mellitus type 2 in obese ([place]) 08/17/2015  ? Severe obesity (BMI >= 40) ([place]) 10/06/2014  ? FHx: BRCA2 gene positive   ? HSV (herpes simplex virus) infection 06/07/2012  ? CAP (community acquired pneumonia) 06/06/2011  ? Pure hypercholesterolemia 11/13/2010  ? Essential hypertension, benign 11/13/2010  ? ? ?Outpatient Medications Prior to Visit  ?Medication Sig Dispense Refill  ? acetaminophen (TYLENOL) 500 MG tablet Take 500-1,000 mg by mouth every 6 (six) hours as needed for moderate pain or headache.    ? albuterol (VENTOLIN HFA) 108 (90 Base) MCG/ACT inhaler  Inhale 2 puffs into the lungs every 6 (six) hours as needed for wheezing or shortness of breath. 3 each 3  ? Alcohol Swabs (B-D SINGLE USE SWABS REGULAR) PADS Use twice a day when checking blood sugars 100 each 1  ? [START ON  10/14/2021] apixaban (ELIQUIS) 5 MG TABS tablet Take 1 tablet (5 mg total) by mouth 2 (two) times daily. Start after you finish starter pack 60 tablet 1  ? bisoprolol-hydrochlorothiazide (ZIAC) 10-6.25 MG tablet TAKE 1 TABLET EVERY DAY 90 tablet 1  ? CALCIUM ACETATE-MAGNESIUM CARB PO Take 2 tablets by mouth daily.    ? cetirizine (ZYRTEC) 10 MG tablet Take 10 mg by mouth daily.    ? clobetasol ointment (TEMOVATE) 3.53 % 1 application. once a week.    ? furosemide (LASIX) 20 MG tablet Take 1 tablet (20 mg total) by mouth daily. 90 tablet 3  ? glucose blood test strip 1 each by Other route daily. Use as instructed 100 each 2  ? guaiFENesin (MUCINEX) 600 MG 12 hr tablet Take 600 mg by mouth at bedtime.    ? Lancets MISC 1 each by Does not apply route daily. 100 each 2  ? metFORMIN (GLUCOPHAGE-XR) 500 MG 24 hr tablet Take 1 tablet (500 mg total) by mouth daily with breakfast. 1 tablet 0  ? montelukast (SINGULAIR) 10 MG tablet Take 1 tablet (10 mg total) by mouth at bedtime. 90 tablet 3  ? Semaglutide,0.25 or 0.5MG/DOS, (OZEMPIC, 0.25 OR 0.5 MG/DOSE,) 2 MG/1.5ML SOPN Inject 0.5 mg into the skin once a week. 6 mL 0  ? simvastatin (ZOCOR) 20 MG tablet TAKE 1 TABLET AT BEDTIME 90 tablet 0  ? tetrahydrozoline 0.05 % ophthalmic solution Place 1 drop into both eyes daily as needed (dry eyes).    ? triamcinolone cream (KENALOG) 0.1 % 1 application. daily as needed (rash or redness).    ? umeclidinium-vilanterol (ANORO ELLIPTA) 62.5-25 MCG/ACT AEPB Inhale 1 puff into the lungs daily. 180 each 1  ? valACYclovir (VALTREX) 500 MG tablet Take 1 tablet (500 mg total) by mouth daily. 90 tablet 3  ? ?No facility-administered medications prior to visit.  ? ? ?Review of Systems  ?Constitutional:  Negative for chills, diaphoresis, fever, malaise/fatigue and weight loss.  ?HENT:  Positive for congestion.   ?Respiratory:  Positive for cough. Negative for hemoptysis, sputum production, shortness of breath and wheezing.   ?Cardiovascular:   Negative for chest pain, palpitations and leg swelling.  ? ?Objective:  ? ?Vitals:  ? 09/30/21 1046  ?BP: 120/60  ?Pulse: 87  ?Temp: 97.8 ?F (36.6 ?C)  ?TempSrc: Oral  ?SpO2: 97%  ?Weight: 228 lb 3.2 oz (103.5 kg)  ?Height: '5\' 3"'  (1.6 m)  ? ?SpO2: 97 % ?O2 Device: None (Room air)  ? ?Body mass index is 40.42 kg/m?. ? ?Physical Exam: ?General: Well-appearing, no acute distress ?HENT: [place], AT ?Eyes: EOMI, no scleral icterus ?Respiratory: Clear to auscultation bilaterally.  No crackles, wheezing or rales ?Cardiovascular: RRR, -M/R/G, no JVD ?Extremities:-Edema,-tenderness ?Neuro: AAO x4, CNII-XII grossly intact ?Psych: Normal mood, normal affect ? ?Data Reviewed: ? ?Imaging ?Screening CT chest 09/10/16 -moderate emphysema ?Screening CT 09/16/17-moderate emphysema, scattered subcentimeter pulmonary nodules.  Fatty liver with findings of cirrhosis. ?Screening CT 11/29/18- interval development of RUL nodule measuring 6.1 mm ?CT Chest 03/03/19 - slightly decreased RUL nodule measuring 5.3 mm ?Screening CT chest 03/26/20 - centrilobular emphysema. Scattered tiny pulmonary nodules, stable ?CTA 07/13/20 Stable pulmonary nodules ?VQ scan 04/02/2021-no pulmonary embolism ?CTA 09/13/2021-suboptimal contrast however  radiology read concerning for filling defects in bilateral pulmonary arteries including segmental and right middle and lower lobe, lingula and left lower lobe.  Left lower lobe consolidation concerning for pulmonary infarct/pneumonia.  Background emphysema ? ?PFTs ?08/28/11 ?FVC 2.71 [92%], FEV1 1.33 (62%), F/F 49, TLC 114%, DLCO 46% ?Moderate obstructive defect with moderate reduction in diffusion capacity. ?No bronchodilator response ?  ?11/08/15 ?FVC 2.05 [7%), FEV1 1.23 (55%), F/F 60 ?Moderate obstructive defect ?  ?09/24/16 ?FVC 2. (82%), FEV1 1.43 (62%), F/F 58 , TLC 99%, DLCO 54% ?Moderate obstruction with moderate diffusion defect. No bronchodilator response. ?  ?Labs ?A1AT 08/08/16- 162, PIMM ? ?Sleep study ?Home sleep study  05/03/19 - AHI 8.8 [place] SpO2 76% ? ?   ?Assessment & Plan:  ?76 year old female with moderate COPD, allergic rhinitis, OSA not on CPAP, hypertension, diabetes, aortic stenosis status post TAVR 07/2020 who presents f

## 2021-09-30 NOTE — Telephone Encounter (Signed)
Shannon Schultz is a medical food (not FDA approved but does have to be shown to be generally safe an non-toxic).  Most common side effects are GI disturbance and headaches. No specific studies conducted in patients with COPD, geriatric patients. The incidence of adverse events was not significantly different in geriatric adults (>76 y/o) vs non-geriatric patients. ? ?No drug interactions note in prescribing information. ?No specific drug-disease interactions noted - history of COPD is not contraindication or precaution to using. Note again that medical foods are not FDA approved so will not undergo the more rigorous gamut of studies. ? ?Sources: ?https://dailymed.[employer]ar[employer].uy ? ?CharmCourses.be.pdf ? ?Knox Saliva, PharmD, MPH, BCPS ?Clinical Pharmacist (Rheumatology and Pulmonology) ?

## 2021-09-30 NOTE — Telephone Encounter (Signed)
Dr. Loanne Drilling wants information regarding safety of Vasculera from pulmonary standpoint.  Thank you. ?

## 2021-09-30 NOTE — Patient Instructions (Addendum)
?  Acute pulmonary embolism ?--REFER to Hematology office ?--ORDER CTA Chest Pulmonary Embolism study in 2 weeks ? ?Moderate COPD GOLD B - last exacerbation 05/2021. Currently well-controlled ?--CONTINUE Anoro ONE puff ONCE a day. REFILL one year ?--CONTINUE Albuterol AS NEEDED for shortness of breath or wheezing ? ?Nocturnal hypoxemic respiratory failure secondary to mild OSA  ?Discussed CPAP vs oral appliance vs weight loss. Prefers to continue oxygen. No symptoms of fatigue, shortness of breath, headaches. Overall asymptomatic. ?--CONTINUE 2L O2 oxygen via nasal cannula nightly  ?--REQUEST ONO report from 09/26/21 ? ?Stasis Dermatitis ?--Will send message to pharmacy team regarding safety of Vasculera from pulmonary standpoint ? ?Follow-up in with me in 3 months ?

## 2021-10-01 ENCOUNTER — Telehealth: Payer: Self-pay | Admitting: *Deleted

## 2021-10-01 ENCOUNTER — Ambulatory Visit
Admission: RE | Admit: 2021-10-01 | Discharge: 2021-10-01 | Disposition: A | Discharge status: Home / Self Care | Payer: Medicare HMO | Source: Ambulatory Visit | Attending: Family Medicine | Admitting: Family Medicine

## 2021-10-01 DIAGNOSIS — Z780 Asymptomatic menopausal state: Secondary | ICD-10-CM | POA: Diagnosis not present

## 2021-10-01 DIAGNOSIS — M8589 Other specified disorders of bone density and structure, multiple sites: Secondary | ICD-10-CM | POA: Diagnosis not present

## 2021-10-01 DIAGNOSIS — Z8781 Personal history of (healed) traumatic fracture: Secondary | ICD-10-CM

## 2021-10-01 DIAGNOSIS — J449 Chronic obstructive pulmonary disease, unspecified: Secondary | ICD-10-CM

## 2021-10-01 NOTE — Telephone Encounter (Signed)
-----   Message from [place], MD sent at 09/30/2021  9:01 PM EDT ----- ?Regarding: Request ONO report ?Patient had ONO report on 09/26/21. Please have it faxed to the office. Thank you - JE ? ?

## 2021-10-01 NOTE — Telephone Encounter (Signed)
I have called patient discussing Vasculera from a Pulmonary standpoint. No known drug-drug interactions with her COPD medications and no contraindications with her known chronic lung disease. The decision to take the medication for her Dermatology issues will need to be discussed the prescribing physician. Patient expressed understanding and appreciated the call. ?

## 2021-10-01 NOTE — Telephone Encounter (Signed)
Called [facility] and spoke with [place]  ?She is going to fax the ONO from 09/26/21 to up front fax ?Will await the results ?

## 2021-10-02 ENCOUNTER — Telehealth: Payer: Self-pay | Admitting: Physician Assistant

## 2021-10-02 ENCOUNTER — Encounter: Payer: Self-pay | Admitting: Pulmonary Disease

## 2021-10-02 NOTE — Telephone Encounter (Signed)
Scheduled appt per 4/3 referral. Pt is aware of appt date and time. Pt is aware to arrive 15 mins prior to appt time and to bring and updated insurance card. Pt is aware of appt location.   ?

## 2021-10-02 NOTE — Telephone Encounter (Signed)
[place] [facility] Telephone Encounter ? ?Overnight oximetry 09/24/21 ?SpO2 <88% for 44 minutes and 32 seconds. Nadir SpO2 80%. ? ?Called patient and advised to continue wearing 2L O2. ?Also spoke with patient regarding Hematology appointment. Recommend keeping until CTA scan results. ? ?Plan ?--Re-order nocturnal oxygen for 2L O2 via [place] nightly ?--Currently CTA insurance coverage is pending. Will let patient know if approved or denied before undergoing test ?

## 2021-10-03 ENCOUNTER — Telehealth: Payer: Self-pay | Admitting: Pulmonary Disease

## 2021-10-04 MED ORDER — PREDNISONE 50 MG PO TABS
ORAL_TABLET | ORAL | 0 refills | Status: DC
Start: 2021-10-04 — End: 2021-11-14

## 2021-10-04 NOTE — Telephone Encounter (Signed)
Prednisone ordered. Please notify patient to pick up and emphasize the following instructions. ? ?Take prednisone 50 mg PO 13 hours, 7 hours, and 1 hour before contrast injection. ?

## 2021-10-04 NOTE — Telephone Encounter (Signed)
Called patient and she states that she needs prednisone called in for her CT scan she states that she is allergic to the contrast dye. I advised patient that I just had to get the approval from Dr Florina Ou to fill this medication first and then I would send it in to the pharmacy ? ?Pharmacy is  ? ?[facility] on [address] ? ?Dr Loanne Drilling please advise ?

## 2021-10-07 NOTE — Telephone Encounter (Signed)
Called and spoke with pt letting her know the instructions about prednisone Rx that was going to be sent in for her to take prior to the CT and she verbalized understanding. Rx was sent to pharmacy by JE.nothing further needed. ?

## 2021-10-09 NOTE — Telephone Encounter (Signed)
See numerous encounters on 09/13/21.  ?

## 2021-10-10 ENCOUNTER — Other Ambulatory Visit: Payer: Medicare HMO

## 2021-10-11 NOTE — Telephone Encounter (Signed)
Checked to see if we had received ONO results and did not see any results in Dr. Cordelia Pen paperwork. Called [facility] and spoke with Corey Skains about this and requested to have results faxed to Chan's fax machine. Will be on the lookout for the results. ?

## 2021-10-11 NOTE — Telephone Encounter (Signed)
ONO report has been received. ONO start date: 09/24/21. ? ?Dr. Loanne Drilling will be back in the office 4/19 so will put this in her box for her to review then. Routing to Dr. Loanne Drilling as an Juluis Rainier. ?

## 2021-10-14 ENCOUNTER — Ambulatory Visit (HOSPITAL_COMMUNITY)
Admission: RE | Admit: 2021-10-14 | Discharge: 2021-10-14 | Disposition: A | Discharge status: Home / Self Care | Payer: Medicare HMO | Source: Ambulatory Visit | Attending: Pulmonary Disease | Admitting: Pulmonary Disease

## 2021-10-14 ENCOUNTER — Encounter: Payer: Self-pay | Admitting: Pulmonary Disease

## 2021-10-14 ENCOUNTER — Encounter (HOSPITAL_COMMUNITY): Payer: Self-pay

## 2021-10-14 DIAGNOSIS — J432 Centrilobular emphysema: Secondary | ICD-10-CM | POA: Diagnosis not present

## 2021-10-14 DIAGNOSIS — I517 Cardiomegaly: Secondary | ICD-10-CM | POA: Diagnosis not present

## 2021-10-14 DIAGNOSIS — I700 Atherosclerosis of aorta: Secondary | ICD-10-CM | POA: Diagnosis not present

## 2021-10-14 DIAGNOSIS — R071 Chest pain on breathing: Secondary | ICD-10-CM | POA: Diagnosis not present

## 2021-10-14 DIAGNOSIS — I2510 Atherosclerotic heart disease of native coronary artery without angina pectoris: Secondary | ICD-10-CM | POA: Diagnosis not present

## 2021-10-14 MED ORDER — SODIUM CHLORIDE (PF) 0.9 % IJ SOLN
INTRAMUSCULAR | Status: AC
  Filled 2021-10-14: qty 50

## 2021-10-14 MED ORDER — IOHEXOL 350 MG/ML SOLN
100.0000 mL | Freq: Once | INTRAVENOUS | Status: AC | PRN
  Administered 2021-10-14: 100 mL via INTRAVENOUS

## 2021-10-14 NOTE — Telephone Encounter (Signed)
Dr. Loanne Drilling, please advise. Pt is questioning if she should keep her appointment with Hematology on 4/19 as her 4/17 CTA shows resolution of PE. Thanks.  ?

## 2021-10-15 ENCOUNTER — Encounter: Payer: Self-pay | Admitting: Pulmonary Disease

## 2021-10-15 NOTE — Progress Notes (Signed)
?[place] ?[phone]   [phone] ? ?INITIAL CONSULT NOTE ? ?Patient Care Team: ?[name], MD as PCP - General (Family Medicine) ?Croitoru, Dani Gobble, MD as PCP - Cardiology (Cardiology) ?[name], MD as Referring Physician (Family Medicine) ?Viona Gilmore, [facility] as Pharmacist (Pharmacist) ? ?Hematological/Oncological History ?1) 09/13/2021: Presented to pulmonology team due to cough with hemoptysis.  CTA chest revealed filling defects within bilateral pulmonary arteries that concerning for pulmonary emboli.  This includes a segmental right lower lobe, middle lobe and lingular pulmonary arteries as well as possibly within the left lower lobe and bilateral upper lobe segmental pulmonary arteries.  Patient was advised to go to the emergency room and she was started on heparin infusion.  Bilateral Doppler ultrasound of the lower extremities was negative for DVT.  She was discharged on Eliquis starter pack. ? ?2)  10/14/2021: Repeat CTA chest: Resolved emboli in the segmental arteries.  No evidence of acute pulmonary embolism ? ?3) 10/16/2021: Establish care with [facility] ? ?CHIEF COMPLAINTS/PURPOSE OF CONSULTATION:  ?"Bilateral pulmonary emboli" ? ?HISTORY OF PRESENTING ILLNESS:  ?Shannon Schultz 76 y.o. female with medical history significant for cigarette use (46 pack years), COPD, allergic rhinitis, nocturnal hypoxemia related to mild OSA, hypertension, DM2, AF status post TAVR 08/19/2020, history of breast cancer status post lumpectomy, bladder cancer status post resection and chemo, AAA without rupture, venous insufficiency, cirrhosis of liver without ascites. ? ?On exam today, [name] reports that she is tolerating Eliquis without any significant limitations. She denies bruising or bleeding. Her cough subsided and she denies any increased shortness of breath. Otherwise, her energy and appetite levels are stable. She denies nausea, vomiting or abdominal pain. Her  bowel habits are unchanged. She has chronic lower extremity edema due to venous stasis. She denies fevers, chill, night sweats, shortness of breath, chest pain or cough. She has no other complaints. Rest of the 10 point ROS is below.  ? ?MEDICAL HISTORY:  ?Past Medical History:  ?Diagnosis Date  ? Arthritis   ? "hands" (05/15/2017)  ? Bladder cancer ([place]) 2018  ? Breast cancer, right ([place]) 1992  ? DCIS,bladder ca (just dx)  ? Colon polyp   ? Complication of anesthesia 1992  ? "local anesthesia" used was hard to awaken from-no problems since (05/15/2017)  ? COPD (chronic obstructive pulmonary disease) ([place])   ? Coronary artery disease   ? COVID-19 virus infection 05/05/2019  ? Diverticulosis   ? Elevated liver enzymes   ? fatty liver per ultrasound per pt  ? FHx: BRCA2 gene positive   ? sister with BRCA2 mutation (pt tested NEGATIVE)  ? HLD (hyperlipidemia)   ? HSV (herpes simplex virus) infection   ? on hip--on daily suppression  ? Hypertension   ? Hypothyroidism   ? took med 7 yrs after birth of 1st child  ? Impaired glucose tolerance   ? Migraine   ? On home oxygen therapy   ? "have it available but I'm not using it" (05/15/2017)  ? Osteopenia   ? S/P TAVR (transcatheter aortic valve replacement) 08/14/2020  ? s/p TAVR with a 26 mm Edwards S3U via the left subclavian approach by Dr. Roxy Manns and Dr. Angelena Form  ? Severe aortic stenosis   ? Sleep apnea   ? Sleeps with 2 L O2 @ night  ? Type 2 diabetes mellitus ([place])   ? Ventral hernia   ? Vitamin D deficiency disease   ? ? ?SURGICAL HISTORY: ?Past Surgical History:  ?  Procedure Laterality Date  ? ABDOMINAL HERNIA REPAIR  05/15/2017  ? BREAST BIOPSY Right 1992  ? BREAST BIOPSY Left 2018  ? BREAST EXCISIONAL BIOPSY Left 2018  ? ATYPICAL DUCTAL HYPERPLASIA INVOLVING A COMPLEX  ? BREAST LUMPECTOMY WITH RADIOACTIVE SEED LOCALIZATION Left 12/22/2016  ? Procedure: LEFT BREAST LUMPECTOMY WITH RADIOACTIVE SEED LOCALIZATION;  Surgeon: Jovita Kussmaul, MD;  Location: [place];  Service:  General;  Laterality: Left;  ? CARDIAC CATHETERIZATION    ? CATARACT EXTRACTION, BILATERAL Bilateral R 03/16/2019 L 03/30/2019  ? Dr. Kathlen Mody  ? COLONOSCOPY  2009, 08/2010, 12/2015  ? Dr. Collene Mares; "only 1 had any polyps" (05/15/2017)  ? CYSTOSCOPY  04/23/2017  ? CYSTOSCOPY W/ RETROGRADES Bilateral 01/12/2017  ? Procedure: CYSTOSCOPY WITH RETROGRADE PYELOGRAM/ EXAM UNDER ANESTHESIA;  Surgeon: Raynelle Bring, MD;  [facility];  Service: Urology;  Laterality: Bilateral;  ? EYE SURGERY    ? FEMUR IM NAIL Right 08/24/2019  ? Procedure: INTRAMEDULLARY (IM) NAIL FEMORAL;  Surgeon: Rod Can, MD;  [facility];  Service: Orthopedics;  Laterality: Right;  ? HERNIA REPAIR    ? INSERTION OF MESH N/A 05/15/2017  ? Procedure: INSERTION OF MESH;  Surgeon: Jovita Kussmaul, MD;  Location: [facility];  Service: General;  Laterality: N/A;  ? LAPAROSCOPIC CHOLECYSTECTOMY  2003  ? MASTECTOMY Right 1992  ? RIGHT/LEFT HEART CATH AND CORONARY ANGIOGRAPHY N/A 07/05/2020  ? Procedure: RIGHT/LEFT HEART CATH AND CORONARY ANGIOGRAPHY;  Surgeon: Burnell Blanks, MD;  Location: [facility] [facility];  Service: Cardiovascular;  Laterality: N/A;  ? TEE WITHOUT CARDIOVERSION N/A 08/14/2020  ? Procedure: TRANSESOPHAGEAL ECHOCARDIOGRAM (TEE);  Surgeon: Burnell Blanks, MD;  Location: [place];  Service: Open Heart Surgery;  Laterality: N/A;  ? TRANSURETHRAL RESECTION OF BLADDER TUMOR WITH MITOMYCIN-C N/A 01/12/2017  ? Procedure: TRANSURETHRAL RESECTION OF BLADDER TUMOR WITH POSSIBLE POST OPERATIVE INSTILLATION OF MITOMYCIN-C;  Surgeon: Raynelle Bring, MD;  [facility];  Service: Urology;  Laterality: N/A;  ? TYMPANOSTOMY TUBE PLACEMENT Bilateral 1980s  ? UMBILICAL HERNIA REPAIR  2003  ? VENTRAL HERNIA REPAIR N/A 05/15/2017  ? Procedure: VENTRAL HERNIA REPAIR WITH MESH;  Surgeon: Jovita Kussmaul, MD;  Location: [place];  Service: General;  Laterality: N/A;  ? ? ?SOCIAL HISTORY: ?Social History  ? ?Socioeconomic History  ? Marital status:  Married  ?  Spouse name: Not on file  ? Number of children: 3  ? Years of education: Not on file  ? Highest education level: Not on file  ?Occupational History  ? Occupation: Retired  ?Tobacco Use  ? Smoking status: Former  ?  Packs/day: 1.00  ?  Years: 46.00  ?  Pack years: 46.00  ?  Types: Cigarettes  ?  Quit date: 05/31/2011  ?  Years since quitting: 10.3  ? Smokeless tobacco: Never  ?Vaping Use  ? Vaping Use: Never used  ?Substance and Sexual Activity  ? Alcohol use: No  ? Drug use: No  ? Sexual activity: Yes  ?  Partners: Male  ?  Birth control/protection: Post-menopausal  ?Other Topics Concern  ? Not on file  ?Social History Narrative  ? Lives with her husband.  Children all live in [place] nearby. No pets. 6 grandchildren.  ? ?Social Determinants of Health  ? ?Financial Resource Strain: Low Risk   ? Difficulty of Paying Living Expenses: Not very hard  ?Food Insecurity: Not on file  ?Transportation Needs: No Transportation Needs  ? Lack of Transportation (Medical): No  ? Lack  of Transportation (Non-Medical): No  ?Physical Activity: Not on file  ?Stress: Not on file  ?Social Connections: Not on file  ?Intimate Partner Violence: Not on file  ? ? ?FAMILY HISTORY: ?Family History  ?Problem Relation Age of Onset  ? Heart disease Mother   ?     tachycardia  ? Heart disease Father   ? Diabetes Father   ? Hypertension Father   ? Asthma Sister   ? Allergies Sister   ? Hyperlipidemia Sister   ? Hashimoto's thyroiditis Sister   ? Fibromyalgia Sister   ? Asthma Sister   ? Allergies Sister   ? Hyperlipidemia Sister   ? Hashimoto's thyroiditis Sister   ? Fibromyalgia Sister   ? Breast cancer Sister 70  ?     BRCA2 positive; metastatic to bones at age 60, then spread to liver  ? Stomach cancer Maternal Grandmother   ?     deceased 71; unk. primary; possibly stomach  ? Tuberculosis Maternal Grandfather   ? Stroke Paternal Grandmother 63  ?     died of cerebral hemorrhage  ? Cancer Other   ?     female cancers, bone cancer   ? ? ?ALLERGIES:  is allergic to contrast media [iodinated contrast media], iohexol, lisinopril, sulfa antibiotics, latex, codeine, levofloxacin, lipitor [atorvastatin calcium], meloxicam, nickel, and vicodin [hydrocod

## 2021-10-16 ENCOUNTER — Inpatient Hospital Stay: Payer: Medicare HMO | Attending: Physician Assistant | Admitting: Physician Assistant

## 2021-10-16 ENCOUNTER — Inpatient Hospital Stay: Payer: Medicare HMO

## 2021-10-16 ENCOUNTER — Other Ambulatory Visit: Payer: Self-pay

## 2021-10-16 ENCOUNTER — Encounter: Payer: Self-pay | Admitting: Physician Assistant

## 2021-10-16 ENCOUNTER — Other Ambulatory Visit: Payer: Self-pay | Admitting: Physician Assistant

## 2021-10-16 VITALS — BP 129/56 | HR 80 | Temp 97.3°F | Resp 18 | Wt 229.2 lb

## 2021-10-16 DIAGNOSIS — Z809 Family history of malignant neoplasm, unspecified: Secondary | ICD-10-CM | POA: Diagnosis not present

## 2021-10-16 DIAGNOSIS — Z8551 Personal history of malignant neoplasm of bladder: Secondary | ICD-10-CM | POA: Diagnosis not present

## 2021-10-16 DIAGNOSIS — Z9221 Personal history of antineoplastic chemotherapy: Secondary | ICD-10-CM | POA: Diagnosis not present

## 2021-10-16 DIAGNOSIS — I2699 Other pulmonary embolism without acute cor pulmonale: Secondary | ICD-10-CM

## 2021-10-16 DIAGNOSIS — Z803 Family history of malignant neoplasm of breast: Secondary | ICD-10-CM | POA: Diagnosis not present

## 2021-10-16 DIAGNOSIS — Z7901 Long term (current) use of anticoagulants: Secondary | ICD-10-CM | POA: Diagnosis not present

## 2021-10-16 DIAGNOSIS — Z87891 Personal history of nicotine dependence: Secondary | ICD-10-CM | POA: Diagnosis not present

## 2021-10-16 DIAGNOSIS — Z853 Personal history of malignant neoplasm of breast: Secondary | ICD-10-CM

## 2021-10-16 DIAGNOSIS — Z800 Family history of malignant neoplasm of digestive organs: Secondary | ICD-10-CM | POA: Diagnosis not present

## 2021-10-16 DIAGNOSIS — Z79899 Other long term (current) drug therapy: Secondary | ICD-10-CM | POA: Diagnosis not present

## 2021-10-16 LAB — CBC WITH DIFFERENTIAL (CANCER CENTER ONLY)
Abs Immature Granulocytes: 0.04 10*3/uL (ref 0.00–0.07)
Basophils Absolute: 0.1 10*3/uL (ref 0.0–0.1)
Basophils Relative: 1 %
Eosinophils Absolute: 0.1 10*3/uL (ref 0.0–0.5)
Eosinophils Relative: 2 %
HCT: 40.8 % (ref 36.0–46.0)
Hemoglobin: 13.4 g/dL (ref 12.0–15.0)
Immature Granulocytes: 1 %
Lymphocytes Relative: 16 %
Lymphs Abs: 1 10*3/uL (ref 0.7–4.0)
MCH: 30.4 pg (ref 26.0–34.0)
MCHC: 32.8 g/dL (ref 30.0–36.0)
MCV: 92.5 fL (ref 80.0–100.0)
Monocytes Absolute: 0.4 10*3/uL (ref 0.1–1.0)
Monocytes Relative: 6 %
Neutro Abs: 4.7 10*3/uL (ref 1.7–7.7)
Neutrophils Relative %: 74 %
Platelet Count: 113 10*3/uL — ABNORMAL LOW (ref 150–400)
RBC: 4.41 MIL/uL (ref 3.87–5.11)
RDW: 14.9 % (ref 11.5–15.5)
WBC Count: 6.4 10*3/uL (ref 4.0–10.5)
nRBC: 0 % (ref 0.0–0.2)

## 2021-10-16 LAB — CMP (CANCER CENTER ONLY)
ALT: 26 U/L (ref 0–44)
AST: 33 U/L (ref 15–41)
Albumin: 3.9 g/dL (ref 3.5–5.0)
Alkaline Phosphatase: 54 U/L (ref 38–126)
Anion gap: 5 (ref 5–15)
BUN: 23 mg/dL (ref 8–23)
CO2: 32 mmol/L (ref 22–32)
Calcium: 9.5 mg/dL (ref 8.9–10.3)
Chloride: 105 mmol/L (ref 98–111)
Creatinine: 0.75 mg/dL (ref 0.44–1.00)
GFR, Estimated: >60 mL/min (ref >60)
Glucose, Bld: 156 mg/dL — ABNORMAL HIGH (ref 70–99)
Potassium: 3.8 mmol/L (ref 3.5–5.1)
Sodium: 142 mmol/L (ref 135–145)
Total Bilirubin: 0.7 mg/dL (ref 0.3–1.2)
Total Protein: 6.6 g/dL (ref 6.5–8.1)

## 2021-10-17 ENCOUNTER — Telehealth: Payer: Self-pay | Admitting: Physician Assistant

## 2021-10-17 LAB — LUPUS ANTICOAGULANT PANEL
DRVVT: 57.7 s — ABNORMAL HIGH (ref 0.0–47.0)
PTT Lupus Anticoagulant: 38.7 s (ref 0.0–43.5)

## 2021-10-17 LAB — DRVVT MIX: dRVVT Mix: 49.2 s — ABNORMAL HIGH (ref 0.0–40.4)

## 2021-10-17 LAB — DRVVT CONFIRM: dRVVT Confirm: 0.9 ratio (ref 0.8–1.2)

## 2021-10-17 NOTE — Telephone Encounter (Signed)
Per 4/20 in basket called and spoke to pt to schedule appointment.  Pt confirmed appointment ?

## 2021-10-17 NOTE — Telephone Encounter (Signed)
Overnight oximetry report 09/24/21 ? ?Reviewed report: ? ?On room air ?SpO2 <88% 44 min and 32 seconds.  ?[place] SpO2 80% ? ?Continue 2L O2 oxygen via [name] nightly ?Please renew order for DME ? ?Rodman Pickle, M.D. ?[facility] ?10/17/2021 8:41 AM  ? ?See Amion for personal pager ?For hours between 7 PM to 7 AM, please call [place] for urgent questions ? ?

## 2021-10-17 NOTE — Telephone Encounter (Signed)
Scheduled per 4/19 los, pt has been called and confirmed ?

## 2021-10-18 LAB — CARDIOLIPIN ANTIBODIES, IGG, IGM, IGA
Anticardiolipin IgA: <9 APL U/mL (ref 0–11)
Anticardiolipin IgG: <9 GPL U/mL (ref 0–14)
Anticardiolipin IgM: 14 MPL U/mL — ABNORMAL HIGH (ref 0–12)

## 2021-10-18 LAB — BETA-2-GLYCOPROTEIN I ABS, IGG/M/A
Beta-2 Glyco I IgG: <9 GPI IgG units (ref 0–20)
Beta-2-Glycoprotein I IgA: <9 GPI IgA units (ref 0–25)
Beta-2-Glycoprotein I IgM: <9 GPI IgM units (ref 0–32)

## 2021-10-22 ENCOUNTER — Telehealth: Payer: Self-pay

## 2021-10-22 NOTE — Telephone Encounter (Signed)
Pt advised with VU 

## 2021-10-22 NOTE — Telephone Encounter (Signed)
-----   Message from [name], PA-C sent at 10/22/2021  9:27 AM EDT ----- ?Please notify patient that lab findings do not indicate clotting disorder (antiphospholipid syndrome). Recommend to continue Eliqius as prescribed.  ?

## 2021-10-23 ENCOUNTER — Telehealth: Payer: Self-pay | Admitting: Pulmonary Disease

## 2021-10-23 NOTE — Telephone Encounter (Signed)
I received this message from Shannon Schultz in regards to the oxygen order that was put in "Pt has nocturnal 02, and that's what the new order is for,  ?Is anything else needed or something changed?" ?

## 2021-10-23 NOTE — Telephone Encounter (Signed)
Dr. Ellison, please advise on this. °

## 2021-10-23 NOTE — Telephone Encounter (Signed)
Patient had new PE. ONO was completed to determine oxygen requirement. After results returned, O2 was re-ordered to update DME ?

## 2021-10-24 NOTE — Telephone Encounter (Signed)
I will updated the DME company and let you know if theres anything else they need ? ?

## 2021-10-24 NOTE — Telephone Encounter (Signed)
They received and will update it. Nothing further needed at this time.  ?

## 2021-10-24 NOTE — Telephone Encounter (Signed)
Shannon Schultz,  ? ?I just wanted to give you the update from Dr Loanne Drilling: ? ?Patient had new PE. ONO was completed to determine oxygen requirement. After results returned, O2 was re-ordered to update DME ? ?Do you need anything else for the O2 order? ? ?Please advise  ?

## 2021-11-04 ENCOUNTER — Other Ambulatory Visit: Payer: Self-pay | Admitting: Family Medicine

## 2021-11-04 DIAGNOSIS — Z1231 Encounter for screening mammogram for malignant neoplasm of breast: Secondary | ICD-10-CM

## 2021-11-06 ENCOUNTER — Encounter: Payer: Self-pay | Admitting: Vascular Surgery

## 2021-11-06 ENCOUNTER — Ambulatory Visit: Payer: Medicare HMO | Admitting: Vascular Surgery

## 2021-11-06 VITALS — BP 136/75 | HR 71 | Temp 98.4°F | Resp 20 | Ht 63.0 in | Wt 227.0 lb

## 2021-11-06 DIAGNOSIS — I872 Venous insufficiency (chronic) (peripheral): Secondary | ICD-10-CM

## 2021-11-06 NOTE — Progress Notes (Signed)
? ?Patient ID: Shannon Schultz, female   DOB: 03-03-1946, 76 y.o.   MRN: [id number] ? ?Reason for Consult: Follow-up ?  ?Referred by [name], MD ? ?Subjective:  ?   ?HPI: ? ?Shannon Schultz is a 76 y.o. female previously for right lower extremity venous reflux with hyperpigmentation and dermatitis for over 10 years.  She has had swelling of the right greater than left lower extremity increasing over the past year.  Does not have any history of DVT and prior vein procedures.  She was recently diagnosed with PE although they did not find the source DVT.  She has been wearing thigh-high compression stockings as tolerable however they do fall down around her thighs she has been wearing knee-high's when needed.  She does not have any ulceration. ? ? ?Past Medical History:  ?Diagnosis Date  ? Arthritis   ? "hands" (05/15/2017)  ? Bladder cancer ([place]) 2018  ? Breast cancer, right ([place]) 1992  ? DCIS,bladder ca (just dx)  ? Colon polyp   ? Complication of anesthesia 1992  ? "local anesthesia" used was hard to awaken from-no problems since (05/15/2017)  ? COPD (chronic obstructive pulmonary disease) ([place])   ? Coronary artery disease   ? COVID-19 virus infection 05/05/2019  ? Diverticulosis   ? Elevated liver enzymes   ? fatty liver per ultrasound per pt  ? FHx: BRCA2 gene positive   ? sister with BRCA2 mutation (pt tested NEGATIVE)  ? HLD (hyperlipidemia)   ? HSV (herpes simplex virus) infection   ? on hip--on daily suppression  ? Hypertension   ? Hypothyroidism   ? took med 7 yrs after birth of 1st child  ? Impaired glucose tolerance   ? Migraine   ? On home oxygen therapy   ? "have it available but I'm not using it" (05/15/2017)  ? Osteopenia   ? S/P TAVR (transcatheter aortic valve replacement) 08/14/2020  ? s/p TAVR with a 26 mm Edwards S3U via the left subclavian approach by Dr. Roxy Manns and Dr. Angelena Form  ? Severe aortic stenosis   ? Sleep apnea   ? Sleeps with 2 L O2 @ night  ? Type 2 diabetes mellitus ([place])   ? Ventral  hernia   ? Vitamin D deficiency disease   ? ?Family History  ?Problem Relation Age of Onset  ? Heart disease Mother   ?     tachycardia  ? Heart disease Father   ? Diabetes Father   ? Hypertension Father   ? Asthma Sister   ? Allergies Sister   ? Hyperlipidemia Sister   ? Hashimoto's thyroiditis Sister   ? Fibromyalgia Sister   ? Asthma Sister   ? Allergies Sister   ? Hyperlipidemia Sister   ? Hashimoto's thyroiditis Sister   ? Fibromyalgia Sister   ? Breast cancer Sister 27  ?     BRCA2 positive; metastatic to bones at age 108, then spread to liver  ? Stomach cancer Maternal Grandmother   ?     deceased 59; unk. primary; possibly stomach  ? Tuberculosis Maternal Grandfather   ? Stroke Paternal Grandmother 69  ?     died of cerebral hemorrhage  ? Cancer Other   ?     female cancers, bone cancer  ? ?Past Surgical History:  ?Procedure Laterality Date  ? ABDOMINAL HERNIA REPAIR  05/15/2017  ? BREAST BIOPSY Right 1992  ? BREAST BIOPSY Left 2018  ? BREAST EXCISIONAL BIOPSY Left 2018  ?  ATYPICAL DUCTAL HYPERPLASIA INVOLVING A COMPLEX  ? BREAST LUMPECTOMY WITH RADIOACTIVE SEED LOCALIZATION Left 12/22/2016  ? Procedure: LEFT BREAST LUMPECTOMY WITH RADIOACTIVE SEED LOCALIZATION;  Surgeon: Jovita Kussmaul, MD;  Location: [facility];  Service: General;  Laterality: Left;  ? CARDIAC CATHETERIZATION    ? CATARACT EXTRACTION, BILATERAL Bilateral R 03/16/2019 L 03/30/2019  ? Dr. Kathlen Mody  ? COLONOSCOPY  2009, 08/2010, 12/2015  ? Dr. Collene Mares; "only 1 had any polyps" (05/15/2017)  ? CYSTOSCOPY  04/23/2017  ? CYSTOSCOPY W/ RETROGRADES Bilateral 01/12/2017  ? Procedure: CYSTOSCOPY WITH RETROGRADE PYELOGRAM/ EXAM UNDER ANESTHESIA;  Surgeon: Raynelle Bring, MD;  [facility];  Service: Urology;  Laterality: Bilateral;  ? EYE SURGERY    ? FEMUR IM NAIL Right 08/24/2019  ? Procedure: INTRAMEDULLARY (IM) NAIL FEMORAL;  Surgeon: Rod Can, MD;  [facility];  Service: Orthopedics;  Laterality: Right;  ? HERNIA REPAIR    ? INSERTION OF MESH N/A  05/15/2017  ? Procedure: INSERTION OF MESH;  Surgeon: Jovita Kussmaul, MD;  Location: [facility];  Service: General;  Laterality: N/A;  ? LAPAROSCOPIC CHOLECYSTECTOMY  2003  ? MASTECTOMY Right 1992  ? RIGHT/LEFT HEART CATH AND CORONARY ANGIOGRAPHY N/A 07/05/2020  ? Procedure: RIGHT/LEFT HEART CATH AND CORONARY ANGIOGRAPHY;  Surgeon: Burnell Blanks, MD;  Location: [facility];  Service: Cardiovascular;  Laterality: N/A;  ? TEE WITHOUT CARDIOVERSION N/A 08/14/2020  ? Procedure: TRANSESOPHAGEAL ECHOCARDIOGRAM (TEE);  Surgeon: Burnell Blanks, MD;  Location: [facility];  Service: Open Heart Surgery;  Laterality: N/A;  ? TRANSURETHRAL RESECTION OF BLADDER TUMOR WITH MITOMYCIN-C N/A 01/12/2017  ? Procedure: TRANSURETHRAL RESECTION OF BLADDER TUMOR WITH POSSIBLE POST OPERATIVE INSTILLATION OF MITOMYCIN-C;  Surgeon: Raynelle Bring, MD;  [facility];  Service: Urology;  Laterality: N/A;  ? TYMPANOSTOMY TUBE PLACEMENT Bilateral 1980s  ? UMBILICAL HERNIA REPAIR  2003  ? VENTRAL HERNIA REPAIR N/A 05/15/2017  ? Procedure: VENTRAL HERNIA REPAIR WITH MESH;  Surgeon: Jovita Kussmaul, MD;  Location: [facility];  Service: General;  Laterality: N/A;  ? ? ?Short Social History:  ?Social History  ? ?Tobacco Use  ? Smoking status: Former  ?  Packs/day: 1.00  ?  Years: 46.00  ?  Pack years: 46.00  ?  Types: Cigarettes  ?  Quit date: 05/31/2011  ?  Years since quitting: 10.4  ? Smokeless tobacco: Never  ?Substance Use Topics  ? Alcohol use: No  ? ? ?Allergies  ?Allergen Reactions  ? Contrast Media [Iodinated Contrast Media] Anaphylaxis, Shortness Of Breath and Other (See Comments)  ?  Could not breath  ? Iohexol Anaphylaxis, Shortness Of Breath and Other (See Comments)  ?  Immediately could not breathe  ? Lisinopril Anaphylaxis, Shortness Of Breath and Rash  ? Sulfa Antibiotics Anaphylaxis and Other (See Comments)  ?  Historical from mother, pt states that mother says she almost died from this drug  ? Latex Other (See Comments)  ?   Unless against on skin for a long time, blisters. Short term is okay.  ? Codeine Nausea Only, Anxiety and Other (See Comments)  ?  insomnia  ? Levofloxacin Other (See Comments)  ?  insomnia  ? Lipitor [Atorvastatin Calcium] Rash  ? Meloxicam Rash  ?  Broke out in a rash on her stomach,back, legs, and behind ears.  ? Nickel Other (See Comments)  ?  With earrings pt has soreness and drainage from piercing  ? Vicodin [Hydrocodone-Acetaminophen] Itching  ? ? ?Current Outpatient Medications  ?  Medication Sig Dispense Refill  ? acetaminophen (TYLENOL) 500 MG tablet Take 500-1,000 mg by mouth every 6 (six) hours as needed for moderate pain or headache.    ? albuterol (VENTOLIN HFA) 108 (90 Base) MCG/ACT inhaler Inhale 2 puffs into the lungs every 6 (six) hours as needed for wheezing or shortness of breath. 3 each 3  ? Alcohol Swabs (B-D SINGLE USE SWABS REGULAR) PADS Use twice a day when checking blood sugars 100 each 1  ? apixaban (ELIQUIS) 5 MG TABS tablet Take 1 tablet (5 mg total) by mouth 2 (two) times daily. Start after you finish starter pack 60 tablet 1  ? bisoprolol-hydrochlorothiazide (ZIAC) 10-6.25 MG tablet TAKE 1 TABLET EVERY DAY 90 tablet 1  ? CALCIUM ACETATE-MAGNESIUM CARB PO Take 2 tablets by mouth daily.    ? cetirizine (ZYRTEC) 10 MG tablet Take 10 mg by mouth daily.    ? clobetasol ointment (TEMOVATE) 8.25 % 1 application. once a week.    ? furosemide (LASIX) 20 MG tablet Take 1 tablet (20 mg total) by mouth daily. 90 tablet 3  ? glucose blood test strip 1 each by Other route daily. Use as instructed 100 each 2  ? guaiFENesin (MUCINEX) 600 MG 12 hr tablet Take 600 mg by mouth at bedtime.    ? Lancets MISC 1 each by Does not apply route daily. 100 each 2  ? metFORMIN (GLUCOPHAGE-XR) 500 MG 24 hr tablet Take 1 tablet (500 mg total) by mouth daily with breakfast. 1 tablet 0  ? montelukast (SINGULAIR) 10 MG tablet Take 1 tablet (10 mg total) by mouth at bedtime. 90 tablet 3  ? predniSONE (DELTASONE) 50 MG  tablet Prednisone 50 mg PO 13 hours, 7 hours, and 1 hour before contrast injection. 3 tablet 0  ? Semaglutide,0.25 or 0.5MG/DOS, (OZEMPIC, 0.25 OR 0.5 MG/DOSE,) 2 MG/1.5ML SOPN Inject 0.5 mg into the skin onc

## 2021-11-14 ENCOUNTER — Ambulatory Visit (INDEPENDENT_AMBULATORY_CARE_PROVIDER_SITE_OTHER): Payer: Medicare HMO | Admitting: Family Medicine

## 2021-11-14 ENCOUNTER — Other Ambulatory Visit: Payer: Self-pay

## 2021-11-14 ENCOUNTER — Encounter: Payer: Self-pay | Admitting: Family Medicine

## 2021-11-14 ENCOUNTER — Inpatient Hospital Stay: Payer: Medicare HMO | Attending: Physician Assistant | Admitting: Genetic Counselor

## 2021-11-14 ENCOUNTER — Inpatient Hospital Stay: Payer: Medicare HMO

## 2021-11-14 ENCOUNTER — Other Ambulatory Visit: Payer: Self-pay | Admitting: Genetic Counselor

## 2021-11-14 VITALS — BP 110/60 | HR 84 | Temp 98.0°F | Ht 63.0 in | Wt 229.0 lb

## 2021-11-14 DIAGNOSIS — R300 Dysuria: Secondary | ICD-10-CM | POA: Diagnosis not present

## 2021-11-14 DIAGNOSIS — Z8481 Family history of carrier of genetic disease: Secondary | ICD-10-CM | POA: Diagnosis not present

## 2021-11-14 DIAGNOSIS — R350 Frequency of micturition: Secondary | ICD-10-CM

## 2021-11-14 DIAGNOSIS — Z853 Personal history of malignant neoplasm of breast: Secondary | ICD-10-CM

## 2021-11-14 DIAGNOSIS — N3000 Acute cystitis without hematuria: Secondary | ICD-10-CM

## 2021-11-14 DIAGNOSIS — E118 Type 2 diabetes mellitus with unspecified complications: Secondary | ICD-10-CM

## 2021-11-14 DIAGNOSIS — Z86000 Personal history of in-situ neoplasm of breast: Secondary | ICD-10-CM

## 2021-11-14 DIAGNOSIS — Z8551 Personal history of malignant neoplasm of bladder: Secondary | ICD-10-CM

## 2021-11-14 LAB — POCT URINALYSIS DIP (PROADVANTAGE DEVICE)
Bilirubin, UA: NEGATIVE
Blood, UA: NEGATIVE
Glucose, UA: 100 mg/dL — AB
Ketones, POC UA: NEGATIVE mg/dL
Nitrite, UA: NEGATIVE
Protein Ur, POC: NEGATIVE mg/dL
Specific Gravity, Urine: 1.03
Urobilinogen, Ur: NEGATIVE
pH, UA: 6 (ref 5.0–8.0)

## 2021-11-14 MED ORDER — NITROFURANTOIN MONOHYD MACRO 100 MG PO CAPS: 100.0000 mg | ORAL_CAPSULE | Freq: Two times a day (BID) | ORAL | 0 refills | Status: DC

## 2021-11-14 NOTE — Progress Notes (Signed)
REFERRING PROVIDER: Cordelia Poche [place],  [place] [address]  PRIMARY PROVIDER:  [name], MD  PRIMARY REASON FOR VISIT:  Encounter Diagnoses  Name Primary?   Family history BRCA2 gene positive    Personal history of breast cancer      HISTORY OF PRESENT ILLNESS:   Shannon Schultz, a 76 y.o. female, was seen for a [place] cancer genetics consultation at the request of Dede Query, PA-C due to a personal history of cancer.  Shannon Schultz presents to clinic today to discuss the possibility of a hereditary predisposition to cancer, to discuss genetic testing, and to further clarify her future cancer risks, as well as potential cancer risks for family members.   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 In 2018, at the age of 64, Shannon Schultz was diagnosed with low grade papillary urothelial carcinoma s/p resection and chemotherapy.   In 2015, she had negative site-specific testing for a BRCA2 mutation detected in her sister (BRCA2 [phone]).  This was repeated twice.  She has not had multi-gene panel testing at this point in time.      RISK FACTORS:  Mammogram within the last year: yes Colonoscopy: normal colonoscopy in 2012 Hysterectomy: no Ovaries intact: yes.  Menarche was at age 12.  First live birth at age 13.  Menopausal status: postmenopausal.  OCP use for approximately  1  years.  HRT use: 0 years. Dermatology: yes; as needed  Past Medical History:  Diagnosis Date   Arthritis    "hands" (05/15/2017)   Bladder cancer ([facility]) 2018   Breast cancer, right ([facility]) 1992   DCIS,bladder ca (just dx)   Colon polyp    Complication of anesthesia 1992   "local anesthesia" used was hard to awaken from-no problems since (05/15/2017)   COPD (chronic obstructive pulmonary disease) (HCC)    Coronary artery disease    COVID-19 virus infection 05/05/2019   Diverticulosis     Elevated liver enzymes    fatty liver per ultrasound per pt   FHx: BRCA2 gene positive    sister with BRCA2 mutation (pt tested NEGATIVE)   HLD (hyperlipidemia)    HSV (herpes simplex virus) infection    on hip--on daily suppression   Hypertension    Hypothyroidism    took med 7 yrs after birth of 1st child   Impaired glucose tolerance    Migraine    On home oxygen therapy    "have it available but I'm not using it" (05/15/2017)   Osteopenia    S/P TAVR (transcatheter aortic valve replacement) 08/14/2020   s/p TAVR with a 26 mm Edwards S3U via the left subclavian approach by Dr. Roxy Manns and Dr. Angelena Form   Severe aortic stenosis    Sleep apnea    Sleeps with 2 L O2 @ night   Type 2 diabetes mellitus ([facility])    Ventral hernia    Vitamin D deficiency disease     Past Surgical History:  Procedure Laterality Date   ABDOMINAL HERNIA REPAIR  05/15/2017   BREAST BIOPSY Right 1992   BREAST BIOPSY Left 2018   BREAST EXCISIONAL BIOPSY Left 2018   ATYPICAL DUCTAL HYPERPLASIA INVOLVING A COMPLEX   BREAST LUMPECTOMY WITH RADIOACTIVE SEED LOCALIZATION Left 12/22/2016   Procedure: LEFT BREAST LUMPECTOMY WITH RADIOACTIVE SEED LOCALIZATION;  Surgeon: Jovita Kussmaul, MD;  Location: [facility];  Service: General;  Laterality: Left;  CARDIAC CATHETERIZATION     CATARACT EXTRACTION, BILATERAL Bilateral R 03/16/2019 L 03/30/2019   Dr. Kathlen Mody   COLONOSCOPY  2009, 08/2010, 12/2015   Dr. Collene Mares; "only 1 had any polyps" (05/15/2017)   CYSTOSCOPY  04/23/2017   CYSTOSCOPY W/ RETROGRADES Bilateral 01/12/2017   Procedure: CYSTOSCOPY WITH RETROGRADE PYELOGRAM/ EXAM UNDER ANESTHESIA;  Surgeon: Raynelle Bring, MD;  Location: [facility];  Service: Urology;  Laterality: Bilateral;   EYE SURGERY     FEMUR IM NAIL Right 08/24/2019   Procedure: INTRAMEDULLARY (IM) NAIL FEMORAL;  Surgeon: Rod Can, MD;  Location: [facility];  Service: Orthopedics;  Laterality: Right;   HERNIA REPAIR     INSERTION OF MESH N/A 05/15/2017    Procedure: INSERTION OF MESH;  Surgeon: Jovita Kussmaul, MD;  Location: [facility];  Service: General;  Laterality: N/A;   LAPAROSCOPIC CHOLECYSTECTOMY  2003   MASTECTOMY Right 1992   RIGHT/LEFT HEART CATH AND CORONARY ANGIOGRAPHY N/A 07/05/2020   Procedure: RIGHT/LEFT HEART CATH AND CORONARY ANGIOGRAPHY;  Surgeon: Burnell Blanks, MD;  Location: [facility];  Service: Cardiovascular;  Laterality: N/A;   TEE WITHOUT CARDIOVERSION N/A 08/14/2020   Procedure: TRANSESOPHAGEAL ECHOCARDIOGRAM (TEE);  Surgeon: Burnell Blanks, MD;  Location: [facility];  Service: Open Heart Surgery;  Laterality: N/A;   TRANSURETHRAL RESECTION OF BLADDER TUMOR WITH MITOMYCIN-C N/A 01/12/2017   Procedure: TRANSURETHRAL RESECTION OF BLADDER TUMOR WITH POSSIBLE POST OPERATIVE INSTILLATION OF MITOMYCIN-C;  Surgeon: Raynelle Bring, MD;  Location: [facility];  Service: Urology;  Laterality: N/A;   TYMPANOSTOMY TUBE PLACEMENT Bilateral 3532D   UMBILICAL HERNIA REPAIR  2003   VENTRAL HERNIA REPAIR N/A 05/15/2017   Procedure: VENTRAL HERNIA REPAIR WITH MESH;  Surgeon: Jovita Kussmaul, MD;  Location: [facility];  Service: General;  Laterality: N/A;    FAMILY HISTORY:  We obtained a detailed, 4-generation family history.  Significant diagnoses are listed below: Family History  Problem Relation Age of Onset   Skin cancer Sister        leg; dx after 24; surgery only   Breast cancer Sister 55       BRCA2 positive; metastatic in late 90s   Cancer Maternal Grandmother        deceased 27; unk. primary; possibly stomach   Cancer Other        unknown GYN cancers; MGM's sisters   Other Niece        BRCA2 positive; prophylatic mastectomy     Shannon Schultz's sister was found to have a BRCA2 mutation in 2015.  Shannon Schultz and her two other sisters had negative genetic testing for this family mutation. There is no reported Ashkenazi Jewish ancestry. There is no known consanguinity.  GENETIC COUNSELING ASSESSMENT: Shannon Schultz is a 76  y.o. female with a personal history of cancer which is somewhat suggestive of a hereditary cancer syndrome given her age of diagnosis for breast cancer. We, therefore, discussed and recommended the following at today's visit.   DISCUSSION: We discussed that 5 - 10% of cancer is hereditary.  Most cases of hereditary breast cancer are associated with mutations in BRCA1/2.  There are other genes that can be associated with hereditary breast cancer syndromes.  These include but are not limited to ATM, CHEK2, and PALB2.  We reviewed that previous genetic showed that she did not have the same BRCA2 mutation as her sister.  However, Shannon Schultz's genetic testing in 2015 only included the family BRCA2 variant and did not analyze other regions  of BRCA2 or other breast cancer genes.  Thus, it is not considered comprehensive. We discussed that updated testing is beneficial for several reasons including knowing how to follow individuals for their cancer risks and understanding if other family members could be at risk for cancer and allowing them to undergo genetic testing.   We reviewed the characteristics, features and inheritance patterns of hereditary cancer syndromes. We also discussed genetic testing, including the appropriate family members to test, the process of testing, insurance coverage and turn-around-time for results. We discussed the implications of a negative, positive, carrier and/or variant of uncertain significant result. We recommended Shannon Schultz pursue genetic testing for a panel that includes genes associated with breast cancer.   Based on Shannon Schultz's personal history of breast cancer before age 71, she meets medical criteria for genetic testing. Despite that she meets criteria, she may still have an out of pocket cost.   PLAN: Shannon Schultz did not wish to pursue genetic testing at today's visit.  She stated that she may wish to have genetic testing in the future if additional family history of  cancer developes. We understand this decision and remain available to coordinate genetic testing at any time in the future. We, therefore, recommend Shannon Schultz continue to follow the cancer screening guidelines given by her primary healthcare provider.  Lastly, we encouraged Shannon Schultz to remain in contact with cancer genetics annually so that we can continuously update the family history and inform her of any changes in cancer genetics and testing that may be of benefit for this family.   Ms. Cunliffe questions were answered to her satisfaction today. Our contact information was provided should additional questions or concerns arise. Thank you for the referral and allowing [place] to share in the care of your patient.   Shannon Schultz M. Joette Catching, [place], [facility] Genetic Counselor [email or web address] (P) [phone]  The patient was seen for a total of 40 minutes in face-to-face genetic counseling. The patient was seen alone.  Drs. Lindi Adie and/or Burr Medico were available to discuss this case as needed.    _______________________________________________________________________ For Office Staff:  Number of people involved in session: 1 Was an Intern/ student involved with case: no

## 2021-11-14 NOTE — Patient Instructions (Signed)
Stay well hydrated. Stop cranberry juice. In the future, you can try the cranberry extract tablets (along with increased hydration/water) at the earliest onset of any urinary symptoms.  This POSSIBLY can help prevent e.coli infections, but not all bladder infections. Avoiding the juice may help keep the sugars down (juice will raise it).

## 2021-11-14 NOTE — Progress Notes (Signed)
Chief Complaint  Patient presents with   Urinary Frequency    Burning with urination and frequency that started 3 days ago.    Urinary urgency, frequency and a discomfort with voiding started 3 days ago.  Feels similar to prior UTI's.  Voiding small amounts, frequently. Slight odor. She hasn't noted any improvement in symptoms since drinking cranberry juice this week (10 oz) Last urine culture 03/2021 E.coli, pan-sensitive  DM--114 fasting yesterday, 133 this morning.  Denies hypoglycemia   Saw vascular surgeons in February. She had f/u last week.  Plan is for laser ablation in the end of June once she finishes her course of Eliquis (due to PE). She couldn't tolerate thigh high compression stockings--had leg cramps, wasn't walking right, they wouldn't stay up.  Wears knee high compression most days (not today).    PMH, PSH, SH reviewed  Outpatient Encounter Medications as of 11/14/2021  Medication Sig Note   Alcohol Swabs (B-D SINGLE USE SWABS REGULAR) PADS Use twice a day when checking blood sugars    apixaban (ELIQUIS) 5 MG TABS tablet Take 1 tablet (5 mg total) by mouth 2 (two) times daily. Start after you finish starter pack    bisoprolol-hydrochlorothiazide (ZIAC) 10-6.25 MG tablet TAKE 1 TABLET EVERY DAY    CALCIUM ACETATE-MAGNESIUM CARB PO Take 2 tablets by mouth daily.    cetirizine (ZYRTEC) 10 MG tablet Take 10 mg by mouth daily.    glucose blood test strip 1 each by Other route daily. Use as instructed    guaiFENesin (MUCINEX) 600 MG 12 hr tablet Take 600 mg by mouth at bedtime.    Lancets MISC 1 each by Does not apply route daily.    metFORMIN (GLUCOPHAGE-XR) 500 MG 24 hr tablet Take 1 tablet (500 mg total) by mouth daily with breakfast.    montelukast (SINGULAIR) 10 MG tablet Take 1 tablet (10 mg total) by mouth at bedtime.    Semaglutide,0.25 or 0.'5MG'$ /DOS, (OZEMPIC, 0.25 OR 0.5 MG/DOSE,) 2 MG/1.5ML SOPN Inject 0.5 mg into the skin once a week. 09/13/2021: Take on Fridays    simvastatin (ZOCOR) 20 MG tablet TAKE 1 TABLET AT BEDTIME    umeclidinium-vilanterol (ANORO ELLIPTA) 62.5-25 MCG/ACT AEPB Inhale 1 puff into the lungs daily.    valACYclovir (VALTREX) 500 MG tablet Take 1 tablet (500 mg total) by mouth daily.    acetaminophen (TYLENOL) 500 MG tablet Take 500-1,000 mg by mouth every 6 (six) hours as needed for moderate pain or headache. (Patient not taking: Reported on 11/14/2021) 09/23/2021: prn   albuterol (VENTOLIN HFA) 108 (90 Base) MCG/ACT inhaler Inhale 2 puffs into the lungs every 6 (six) hours as needed for wheezing or shortness of breath. (Patient not taking: Reported on 11/14/2021) 09/23/2021: prn   clobetasol ointment (TEMOVATE) 5.17 % 1 application. once a week. 09/23/2021: Using once every 3 weeks   furosemide (LASIX) 20 MG tablet Take 1 tablet (20 mg total) by mouth daily. (Patient not taking: Reported on 11/14/2021) 09/23/2021: prn   triamcinolone cream (KENALOG) 0.1 % 1 application. daily as needed (rash or redness). (Patient not taking: Reported on 11/14/2021)    [DISCONTINUED] predniSONE (DELTASONE) 50 MG tablet Prednisone 50 mg PO 13 hours, 7 hours, and 1 hour before contrast injection.    [DISCONTINUED] tetrahydrozoline 0.05 % ophthalmic solution Place 1 drop into both eyes daily as needed (dry eyes). 09/23/2021: prn   No facility-administered encounter medications on file as of 11/14/2021.   Allergies  Allergen Reactions   Contrast Media [Iodinated Contrast  Media] Anaphylaxis, Shortness Of Breath and Other (See Comments)    Could not breath   Iohexol Anaphylaxis, Shortness Of Breath and Other (See Comments)    Immediately could not breathe   Lisinopril Anaphylaxis, Shortness Of Breath and Rash   Sulfa Antibiotics Anaphylaxis and Other (See Comments)    Historical from mother, pt states that mother says she almost died from this drug   Latex Other (See Comments)    Unless against on skin for a long time, blisters. Short term is okay.   Codeine Nausea  Only, Anxiety and Other (See Comments)    insomnia   Levofloxacin Other (See Comments)    insomnia   Lipitor [Atorvastatin Calcium] Rash   Meloxicam Rash    Broke out in a rash on her stomach,back, legs, and behind ears.   Nickel Other (See Comments)    With earrings pt has soreness and drainage from piercing   Vicodin [Hydrocodone-Acetaminophen] Itching   ROS:  no fever, chills, abdominal pain, flank pain, vaginal discharge, hematuria. No n/v/d. No bleeding, bruising, rashes.    PHYSICAL EXAM:  BP 110/60   Pulse 84   Temp 98 F (36.7 C) (Tympanic)   Ht '5\' 3"'$  (1.6 m)   Wt 229 lb (103.9 kg)   LMP  (LMP Unknown)   BMI 40.57 kg/m   Wt Readings from Last 3 Encounters:  11/14/21 229 lb (103.9 kg)  11/06/21 227 lb (103 kg)  10/16/21 229 lb 3.2 oz (104 kg)   Pleasant, well-appearing female, in no distress HEENT: conjunctiva and sclera are clear, EOMI Back: no CVA tenderness Abdomen: obese, soft, nontender Extremities: Trace to 1+ pitting edema pretibially R>L. No sores/lesions on LE's Psych: normal mood, affect, hygiene and urine Neuro: alert and oriented.  Cranial nerves intact. Normal gait (not using any assistance)   Urine dip: Small leuks, trace glucose, SG 1.030   ASSESSMENT/PLAN:  Acute cystitis without hematuria - Plan: Urine Culture, nitrofurantoin, macrocrystal-monohydrate, (MACROBID) 100 MG capsule  Burning with urination - Plan: POCT Urinalysis DIP (Proadvantage Device)  Urinary frequency - Plan: POCT Urinalysis DIP (Proadvantage Device)  Type 2 diabetes mellitus with complications (HCC) - Sugars don't sound too high (but noted in urine). Counseled re: avoiding juice.   Macrobid and keflex as options, due to allergies. Treated with macrobid this time.  Counseled re: avoiding cranberry juice due to her DM/glu. Can consider cranberry extract tablets (and increased water) with early symptoms in the future.

## 2021-11-15 ENCOUNTER — Encounter: Payer: Self-pay | Admitting: Genetic Counselor

## 2021-11-15 DIAGNOSIS — Z853 Personal history of malignant neoplasm of breast: Secondary | ICD-10-CM | POA: Insufficient documentation

## 2021-11-15 HISTORY — DX: Personal history of malignant neoplasm of breast: Z85.3

## 2021-11-15 LAB — URINE CULTURE

## 2021-11-27 ENCOUNTER — Other Ambulatory Visit: Payer: Self-pay | Admitting: *Deleted

## 2021-11-27 DIAGNOSIS — I872 Venous insufficiency (chronic) (peripheral): Secondary | ICD-10-CM

## 2021-11-29 ENCOUNTER — Ambulatory Visit (INDEPENDENT_AMBULATORY_CARE_PROVIDER_SITE_OTHER): Payer: Medicare HMO | Admitting: Family Medicine

## 2021-11-29 ENCOUNTER — Encounter: Payer: Self-pay | Admitting: Family Medicine

## 2021-11-29 VITALS — BP 118/72 | HR 71 | Temp 98.4°F | Wt 230.6 lb

## 2021-11-29 DIAGNOSIS — N3000 Acute cystitis without hematuria: Secondary | ICD-10-CM | POA: Diagnosis not present

## 2021-11-29 DIAGNOSIS — R300 Dysuria: Secondary | ICD-10-CM | POA: Diagnosis not present

## 2021-11-29 LAB — POCT URINALYSIS DIP (PROADVANTAGE DEVICE)
Bilirubin, UA: NEGATIVE
Blood, UA: NEGATIVE
Glucose, UA: NEGATIVE mg/dL
Ketones, POC UA: NEGATIVE mg/dL
Nitrite, UA: NEGATIVE
Protein Ur, POC: NEGATIVE mg/dL
Specific Gravity, Urine: 1.025
Urobilinogen, Ur: 0.2
pH, UA: 6 (ref 5.0–8.0)

## 2021-11-29 MED ORDER — DOXYCYCLINE HYCLATE 100 MG PO TABS: 100.0000 mg | ORAL_TABLET | Freq: Two times a day (BID) | ORAL | 0 refills | Status: DC

## 2021-11-29 NOTE — Progress Notes (Signed)
   Subjective:    Patient ID: Shannon Schultz, female    DOB: Jul 27, 1945, 76 y.o.   MRN: [id number]  HPI She is here for a recheck.  She is still having some urinary symptoms of slight dysuria and urgency.  She was recently treated with Macrodantin.  Urine culture was equivocal.  She is having no fever or chills.   Review of Systems     Objective:   Physical Exam  Alert and in no distress.  Urine microscopic did show multiple white blood cells.      Assessment & Plan:  Burning with urination - Plan: POCT Urinalysis DIP (Proadvantage Device) I will switch her to a different antibiotic.  She thinks that this 1 was used in the past with good success.  She will return here if her symptoms recur or do not go away completely.  Discussed possible repeat urine culture and/or referral to urology.

## 2021-12-09 ENCOUNTER — Other Ambulatory Visit: Payer: Self-pay

## 2021-12-09 ENCOUNTER — Other Ambulatory Visit: Payer: Medicare HMO

## 2021-12-09 DIAGNOSIS — E118 Type 2 diabetes mellitus with unspecified complications: Secondary | ICD-10-CM | POA: Diagnosis not present

## 2021-12-09 DIAGNOSIS — Z5181 Encounter for therapeutic drug level monitoring: Secondary | ICD-10-CM | POA: Diagnosis not present

## 2021-12-09 DIAGNOSIS — E785 Hyperlipidemia, unspecified: Secondary | ICD-10-CM | POA: Diagnosis not present

## 2021-12-09 DIAGNOSIS — E1169 Type 2 diabetes mellitus with other specified complication: Secondary | ICD-10-CM | POA: Diagnosis not present

## 2021-12-09 DIAGNOSIS — K7460 Unspecified cirrhosis of liver: Secondary | ICD-10-CM | POA: Diagnosis not present

## 2021-12-10 LAB — COMPREHENSIVE METABOLIC PANEL
ALT: 21 IU/L (ref 0–32)
AST: 27 IU/L (ref 0–40)
Albumin/Globulin Ratio: 1.8 (ref 1.2–2.2)
Albumin: 4 g/dL (ref 3.7–4.7)
Alkaline Phosphatase: 65 IU/L (ref 44–121)
BUN/Creatinine Ratio: 22 (ref 12–28)
BUN: 15 mg/dL (ref 8–27)
Bilirubin Total: 1.2 mg/dL (ref 0.0–1.2)
CO2: 24 mmol/L (ref 20–29)
Calcium: 9.3 mg/dL (ref 8.7–10.3)
Chloride: 102 mmol/L (ref 96–106)
Creatinine, Ser: 0.69 mg/dL (ref 0.57–1.00)
Globulin, Total: 2.2 g/dL (ref 1.5–4.5)
Glucose: 128 mg/dL — ABNORMAL HIGH (ref 70–99)
Potassium: 4.6 mmol/L (ref 3.5–5.2)
Sodium: 140 mmol/L (ref 134–144)
Total Protein: 6.2 g/dL (ref 6.0–8.5)
eGFR: 90 mL/min/{1.73_m2} (ref >59)

## 2021-12-10 LAB — HEMOGLOBIN A1C
Est. average glucose Bld gHb Est-mCnc: 134 mg/dL
Hgb A1c MFr Bld: 6.3 % — ABNORMAL HIGH (ref 4.8–5.6)

## 2021-12-10 LAB — MICROALBUMIN / CREATININE URINE RATIO
Creatinine, Urine: 39.4 mg/dL
Microalb/Creat Ratio: 15 mg/g creat (ref 0–29)
Microalbumin, Urine: 6.1 ug/mL

## 2021-12-10 LAB — LIPID PANEL
Chol/HDL Ratio: 2.2 ratio (ref 0.0–4.4)
Cholesterol, Total: 118 mg/dL (ref 100–199)
HDL: 54 mg/dL (ref >39)
LDL Chol Calc (NIH): 49 mg/dL (ref 0–99)
Triglycerides: 70 mg/dL (ref 0–149)
VLDL Cholesterol Cal: 15 mg/dL (ref 5–40)

## 2021-12-11 NOTE — Progress Notes (Signed)
Chief Complaint  Patient presents with   med check     Labs done no other concerns follow up on UTI still on abx and no symptoms today    Patient presents for follow up on Diabetes and other issues.  See below for labs done prior to visit.  Seen in May with sx of UTI.  Treated with macrobid. Culture result: Organism ID, Bacteria Comment   Comment: Greater than 2 organisms recovered, none predominant. Please submit  another sample if clinically indicated.  50,000-100,000 colony forming units per mL    She saw Dr. Redmond School 6/2 for recheck, with ongoing slight dysuria and urgency. He prescribed doxycycline. Today she reports she has 2 pills left; symptoms have completely resolved.  PE and pneumonia in 08/2021.  She completed ABX and is on Eliquis, to complete 3 month course tomorrow.  She is under the care of pulmonary. She had repeat CT angio last month: IMPRESSION: Resolved emboli in the segmental arteries. No evidence of acute pulmonary embolism. Resolved left lower lobe consolidation with residual scarring. No new airspace disease. Moderate centrilobular emphysema. Hepatic cirrhosis.  Splenomegaly.  Diabetes: At her physical in 04/2021 she was started on Ozempic--to improve control of sugars, and also help with weight loss.  Metformin dose was decreased to '500mg'$  once daily. She is tolerating 0.'5mg'$  without side effects. At her visit in 07/2021 we discussed the potential to increase Ozempic (and stop metformin), to further help with weight loss. She declined change at that time, wanting to hold off until after vein surgery (which was delayed, still hasn't had).  Blood sugars are running 120's-130's, sometimes lower.  Infrequently higher (if had a cookie the night before).   It was 128 this morning. Denies hypoglycemia, polydipsia and polyuria.  Last yearly eye exam was in 06/2021. Patient checks feet regularly without concerns.  Denies numbness, tingling other than intermittent numbness in  the 2nd and 3rd toes on the right foot (h/o fractures, didn't heal right, per pt). No skin lesions.  Hypertension: Hasn't been checking BP at home, had all been normal. Denies headaches, chest pain. Denies side effects of medications (on bisoprolol HCT). Compliant with medications.  Venous insufficiency and edema:  plan is for laser ablation in July (waited for Eliquis course to be completed.) She no longer takes lasix daily, just as needed.  She had been doing very well, until this morning.  Noted a lot of swelling this morning.  Hasn't taken lasix yet due to her appointment, but plans to. Her weight was up about 4#.  Did a lot of sitting yesterday without wearing the stockings, unsure if she had something salty. Wears knee high compression most days. She denies any skin lesions/wounds.  H/o aortic stenosis--she underwent TAVR 07/2020.  She was prescribed ABX for SBE prophylaxis by cardiologist. She denies syncope, angina, dyspnea.  She was noted to have mild nonobstructive CAD on cardiac cath.  She had echo in Feb and March of these year, stable.  COPD:  She is doing well on Anoro, only rarely needs albuterol. She is under the care of Dr. Loanne Drilling.   She has nocturnal hypoxemia secondary to OSA and uses nocturnal oxygen. She had declined CPAP titration, opting to work on weight loss and continue oxygen. OSA was felt to be mild.  She denies unrefreshed sleep or daytime somnolence.  Hyperlipidemia follow-up: Patient is reportedly following a low-fat, low cholesterol diet. Compliant with simvastatin and denies medication side effects.  She has known aortic atherosclerosis.  She also has known 3cm AAA.  Recommendation is for [place] f/u in 10/2022 (based on CT 10/2019).   Cirrhosis:  CT 10/2019 revealed hepatic cirrhosis and findings of portal venous hypertension. No evidence of ascites. Dr. Collene Mares is her GI. She has had normal PT/INR, albumin, LFT's.   H/o bladder cancer--lesion in bladder was noted  incidentally on CT 11/2016 prior to hernia surgery. Pathology showed low grade papillary urothelial CA, and she was treated with intravesicular chemo.  Last f/u cystoscopy was in 04/2021.   Had cataract surgery 2 years ago by Dr. Kathlen Mody.  She noticed dimming of her vision 4-5 months after her surgery.  It has gotten much worse. She saw another doctor (Dr. Kathlen Mody left), and had laser surgery on both eyes, and vision is even worse/dim.  She would like a referral to another ophthalmologist for evaluation.    PMH, PSH, SH reviewed  Outpatient Encounter Medications as of 12/12/2021  Medication Sig Note   Alcohol Swabs (B-D SINGLE USE SWABS REGULAR) PADS Use twice a day when checking blood sugars    apixaban (ELIQUIS) 5 MG TABS tablet Take 1 tablet (5 mg total) by mouth 2 (two) times daily. Start after you finish starter pack 12/12/2021: Tomorrow is last day of treatment   bisoprolol-hydrochlorothiazide (ZIAC) 10-6.25 MG tablet TAKE 1 TABLET EVERY DAY    cetirizine (ZYRTEC) 10 MG tablet Take 10 mg by mouth daily.    clobetasol ointment (TEMOVATE) 4.28 % 1 application. once a week. 09/23/2021: Using once every 3 weeks   doxycycline (VIBRA-TABS) 100 MG tablet Take 1 tablet (100 mg total) by mouth 2 (two) times daily. 12/12/2021: Two more dose tonight and tomorrow.     glucose blood test strip 1 each by Other route daily. Use as instructed    guaiFENesin (MUCINEX) 600 MG 12 hr tablet Take 600 mg by mouth at bedtime.    Lancets MISC 1 each by Does not apply route daily.    metFORMIN (GLUCOPHAGE-XR) 500 MG 24 hr tablet Take 1 tablet (500 mg total) by mouth daily with breakfast.    montelukast (SINGULAIR) 10 MG tablet Take 1 tablet (10 mg total) by mouth at bedtime.    Semaglutide,0.25 or 0.'5MG'$ /DOS, (OZEMPIC, 0.25 OR 0.5 MG/DOSE,) 2 MG/1.5ML SOPN Inject 0.5 mg into the skin once a week. 09/13/2021: Take on Fridays   umeclidinium-vilanterol (ANORO ELLIPTA) 62.5-25 MCG/ACT AEPB Inhale 1 puff into the lungs daily.     valACYclovir (VALTREX) 500 MG tablet Take 1 tablet (500 mg total) by mouth daily.    acetaminophen (TYLENOL) 500 MG tablet Take 500-1,000 mg by mouth every 6 (six) hours as needed for moderate pain or headache. (Patient not taking: Reported on 12/12/2021) 12/12/2021: PRN last used over one week ago   albuterol (VENTOLIN HFA) 108 (90 Base) MCG/ACT inhaler Inhale 2 puffs into the lungs every 6 (six) hours as needed for wheezing or shortness of breath. (Patient not taking: Reported on 11/14/2021) 12/12/2021: PRN last use over one month ago   CALCIUM ACETATE-MAGNESIUM CARB PO Take 2 tablets by mouth daily. (Patient not taking: Reported on 12/12/2021) 12/12/2021: Not taking since she has been on abx.    furosemide (LASIX) 20 MG tablet Take 1 tablet (20 mg total) by mouth daily. (Patient not taking: Reported on 12/12/2021) 12/12/2021: PRN last taken two weeks ago   simvastatin (ZOCOR) 20 MG tablet TAKE 1 TABLET AT BEDTIME    triamcinolone cream (KENALOG) 0.1 % 1 application. daily as needed (rash or redness). (Patient  not taking: Reported on 12/12/2021) 12/12/2021: Prn last used a week ago   No facility-administered encounter medications on file as of 12/12/2021.   Allergies  Allergen Reactions   Contrast Media [Iodinated Contrast Media] Anaphylaxis, Shortness Of Breath and Other (See Comments)    Could not breath   Iohexol Anaphylaxis, Shortness Of Breath and Other (See Comments)    Immediately could not breathe   Lisinopril Anaphylaxis, Shortness Of Breath and Rash   Sulfa Antibiotics Anaphylaxis and Other (See Comments)    Historical from mother, pt states that mother says she almost died from this drug   Latex Other (See Comments)    Unless against on skin for a long time, blisters. Short term is okay.   Codeine Nausea Only, Anxiety and Other (See Comments)    insomnia   Levofloxacin Other (See Comments)    insomnia   Lipitor [Atorvastatin Calcium] Rash   Meloxicam Rash    Broke out in a rash on her  stomach,back, legs, and behind ears.   Nickel Other (See Comments)    With earrings pt has soreness and drainage from piercing   Vicodin [Hydrocodone-Acetaminophen] Itching    ROS:  no fever, chills, URI symptoms, headaches, dizziness, chest pain.  Denies abdominal pain, flank pain, vaginal discharge, hematuria, dysuria. No n/v/d. No bleeding, bruising, rashes. Chronic edema in LE's, mainly on the R.  Some discomfort in R upper thigh, no calf pain. Breathing is at baseline Moods are good Decline in vision per HPI.      PHYSICAL EXAM:  BP 130/60   Pulse 76   Temp (!) 96.7 F (35.9 C)   Wt 232 lb 6.4 oz (105.4 kg)   LMP  (LMP Unknown)   SpO2 96%   BMI 41.17 kg/m   Wt Readings from Last 3 Encounters:  12/12/21 232 lb 6.4 oz (105.4 kg)  11/29/21 230 lb 9.6 oz (104.6 kg)  11/14/21 229 lb (103.9 kg)   Pleasant, well-appearing female, in no distress HEENT: conjunctiva and sclera are clear, EOMI. OP clear Neck: no lymphadenopathy, thyromegaly or mass Heart: regular rate and rhythm, 2/6 SEM at RUSB Lungs: clear, no wheezes, rales ronchi Back: no CVA tenderness Abdomen: obese, soft, nontender Extremities: wearing compression socks.  Trace edema at ankle on the left.  1+ pretibial edema (entire lower leg) on the right.  Calves nontender. Psych: normal mood, affect, hygiene and urine Neuro: alert and oriented.  Cranial nerves intact. Normal gait   Urine dip today--normal, SG 1.025  Lab Results  Component Value Date   HGBA1C 6.3 (H) 12/09/2021   Urine microalb/Cr ratio 15 Fasting glucose 128    Chemistry      Component Value Date/Time   NA 140 12/09/2021 0855   K 4.6 12/09/2021 0855   CL 102 12/09/2021 0855   CO2 24 12/09/2021 0855   BUN 15 12/09/2021 0855   CREATININE 0.69 12/09/2021 0855   CREATININE 0.75 10/16/2021 1223   CREATININE 0.75 03/05/2017 0736      Component Value Date/Time   CALCIUM 9.3 12/09/2021 0855   ALKPHOS 65 12/09/2021 0855   AST 27  12/09/2021 0855   AST 33 10/16/2021 1223   ALT 21 12/09/2021 0855   ALT 26 10/16/2021 1223   BILITOT 1.2 12/09/2021 0855   BILITOT 0.7 10/16/2021 1223     Lab Results  Component Value Date   CHOL 118 12/09/2021   HDL 54 12/09/2021   LDLCALC 49 12/09/2021   TRIG 70 12/09/2021  CHOLHDL 2.2 12/09/2021    ASSESSMENT/PLAN:  Type 2 diabetes mellitus with complications ([place]) - well controlled. prefers to stay on 0.'5mg'$  dose of ozempic, and metformin. Consider increasing ozempic for wt loss at f/u (declines)  Hypertension associated with diabetes ([place]) - controlled  Hyperlipidemia associated with type 2 diabetes mellitus ([place]) - lipids at goal, cont statin  Cirrhosis of liver without ascites, unspecified hepatic cirrhosis type ([place]) - no complications at this time. Encouraged wt loss  Aortic atherosclerosis ([facility]) - cont statin  Current use of anticoagulant therapy - completes 3 mos course of anticoag for PE tomorrow  Morbid obesity due to excess calories ([place]) - counseled re: diet, exercise, wt loss.  Declined increasing ozempic dose.   Venous insufficiency of right lower extremity - planning for venous surgery next month. Cont support hose, leg elevation, low Na diet. To take lasix today.  Chronic hypoxemic respiratory failure (HCC) - cont oxygen at night  Burning with urination - recently treated for UTI with doxy; urine sx have completely resolved, and urine dip normal today - Plan: POCT Urinalysis DIP (Proadvantage Device)  Poor vision - Plan: Ambulatory referral to Ophthalmology  Status post cataract extraction, unspecified laterality - Plan: Ambulatory referral to Ophthalmology  Pt states she doesn't need any meds refilled. (According to chart, looks like she should need bisoprolol, simvastatin.)  F/u as scheduled in 04/2022

## 2021-12-12 ENCOUNTER — Ambulatory Visit (INDEPENDENT_AMBULATORY_CARE_PROVIDER_SITE_OTHER): Payer: Medicare HMO | Admitting: Family Medicine

## 2021-12-12 ENCOUNTER — Encounter: Payer: Self-pay | Admitting: Family Medicine

## 2021-12-12 ENCOUNTER — Other Ambulatory Visit: Payer: Self-pay | Admitting: Family Medicine

## 2021-12-12 VITALS — BP 130/60 | HR 76 | Temp 96.7°F | Wt 232.4 lb

## 2021-12-12 DIAGNOSIS — E7800 Pure hypercholesterolemia, unspecified: Secondary | ICD-10-CM

## 2021-12-12 DIAGNOSIS — H547 Unspecified visual loss: Secondary | ICD-10-CM

## 2021-12-12 DIAGNOSIS — E1159 Type 2 diabetes mellitus with other circulatory complications: Secondary | ICD-10-CM

## 2021-12-12 DIAGNOSIS — Z7901 Long term (current) use of anticoagulants: Secondary | ICD-10-CM | POA: Diagnosis not present

## 2021-12-12 DIAGNOSIS — I152 Hypertension secondary to endocrine disorders: Secondary | ICD-10-CM

## 2021-12-12 DIAGNOSIS — E1169 Type 2 diabetes mellitus with other specified complication: Secondary | ICD-10-CM | POA: Diagnosis not present

## 2021-12-12 DIAGNOSIS — R300 Dysuria: Secondary | ICD-10-CM

## 2021-12-12 DIAGNOSIS — Z9849 Cataract extraction status, unspecified eye: Secondary | ICD-10-CM

## 2021-12-12 DIAGNOSIS — J9611 Chronic respiratory failure with hypoxia: Secondary | ICD-10-CM

## 2021-12-12 DIAGNOSIS — K7460 Unspecified cirrhosis of liver: Secondary | ICD-10-CM

## 2021-12-12 DIAGNOSIS — I872 Venous insufficiency (chronic) (peripheral): Secondary | ICD-10-CM

## 2021-12-12 DIAGNOSIS — E118 Type 2 diabetes mellitus with unspecified complications: Secondary | ICD-10-CM

## 2021-12-12 DIAGNOSIS — I700 Atherosclerosis of aorta: Secondary | ICD-10-CM | POA: Diagnosis not present

## 2021-12-12 DIAGNOSIS — E785 Hyperlipidemia, unspecified: Secondary | ICD-10-CM

## 2021-12-12 LAB — POCT URINALYSIS DIP (PROADVANTAGE DEVICE)
Bilirubin, UA: NEGATIVE
Blood, UA: NEGATIVE
Glucose, UA: NEGATIVE mg/dL
Ketones, POC UA: NEGATIVE mg/dL
Leukocytes, UA: NEGATIVE
Nitrite, UA: NEGATIVE
Protein Ur, POC: NEGATIVE mg/dL
Specific Gravity, Urine: 1.025
Urobilinogen, Ur: 0.2
pH, UA: 6 (ref 5.0–8.0)

## 2021-12-13 ENCOUNTER — Ambulatory Visit
Admission: RE | Admit: 2021-12-13 | Discharge: 2021-12-13 | Disposition: A | Discharge status: Home / Self Care | Payer: Medicare HMO | Source: Ambulatory Visit | Attending: Family Medicine | Admitting: Family Medicine

## 2021-12-13 DIAGNOSIS — Z1231 Encounter for screening mammogram for malignant neoplasm of breast: Secondary | ICD-10-CM

## 2021-12-19 DIAGNOSIS — Z4431 Encounter for fitting and adjustment of external right breast prosthesis: Secondary | ICD-10-CM | POA: Diagnosis not present

## 2021-12-19 DIAGNOSIS — C50111 Malignant neoplasm of central portion of right female breast: Secondary | ICD-10-CM | POA: Diagnosis not present

## 2021-12-23 ENCOUNTER — Telehealth: Payer: Self-pay | Admitting: Pharmacist

## 2021-12-23 NOTE — Chronic Care Management (AMB) (Signed)
Chronic Care Management Pharmacy Assistant   Name: Shannon Schultz  MRN: [id number] DOB: 21-Aug-1945  Reason for Encounter: Disease State   Conditions to be addressed/monitored: DMII/ COPD  Recent office visits:  12/12/21 [name], MD - Patient presented for Type 2 diabetes mellitus with complications and other concerns. No medication changes.  11/29/21 [name], MD - Patient presented for acute cystitis without hematuria and other concerns. Prescribed Doxycycline Hyclate 100 mg. Stopped Nitrofurantoin Monohyd Macro.   11/14/21 [name], MD - Patient presented for acute cystitis without hematuria and other concerns. Prescribed Nitrofurantoin Monohyd Macro 100 mg. Stopped Prednisone. Stopped Tetrahydrozoline HCL.   09/23/21 [name], MD - Patient presented for Bilateral pulmonary embolism and other concerns. Changed Apixaban. Stopped Amoxicillin. Stopped Azithromycin.   09/11/21 [name], MD - Patient presented  Via Video for Pleural effusion associated with pulmonary infection and other concerns. Prescribed Amoxicillin- Pot Clavulanate 875-125 mg. Prescribed Azithromycin 250 mg. Prescribed Prednisone 10 mg.  08/15/21 [name], MD - Patient presented for type 2 diabetes mellitus with complications and other concerns. Stopped Prednisone 20 mg.   Recent consult visits:  11/06/21 [name], MD (Vascular Surgery) - Patient presented for Venous insufficiency. No medication changes.  10/16/21 [name], PA-C ( Hematology and Oncology) - Patient presented for Acute pulmonary embolism without acute cor pulmonale unspecified pulmonary embolism type. No medication changes.  09/30/21 [name], MD (Pulmonary Disease) - Patient presented for Other acute pulmonary embolism without acute cor pulmonale and other concerns. Ordered CTA Chest. Stopped Flonase.   09/26/21 [employer] - Claims encounter for Idiopathic sleep related nonobstructive alveolar  hypoventilation.  09/17/21 [name], [place] encounter for Venous insufficiency. No other visit details available.  09/13/21 Patient presented to [facility] for CT Angio Chest PE W/CM &/OR WO  09/13/21 [name], [name], NP (Nurse Practitioner) - Patient presented for Hemoptysis and other concerns. Increased Prednisone.  09/02/21 [name], MD - Patient presented for Nocturnal hypoxemia and other concerns. Changed Albuterol Sulfate. Stopped Acetaminophen. Stopped Amoxicillin.   08/26/21 [name] ( Ophthalmology) - Claims encounter for other secondary cataract of left eye. No other visit details available.  08/01/21 [name] (Vascular and vein specialists) - Patient presented for Venous insufficiency. No medication changes.  07/31/21 [name], PA-C (Cardiology) - Patient presented for S/P TAVR and other concerns. No medication changes  07/25/21 [name] (Ophthalmology) - Claims encounter for Other secondary cataract in right eye. No other visit details available.  07/25/21 [name], [place] encounter for Venous insufficiency chronic and other concerns. No other visit details available.  07/25/21 [name] ( Dermatopathology) - Claims encounter for Unspecified contact dermatitis with unspecified cause. No other visit details available.  Hospital visits:  Medication Reconciliation was completed by comparing discharge summary, patient's EMR and Pharmacy list, and upon discussion with patient.  Patient [facility]  [facility]21 due to Pulmonary Embolism. Patient was present for 18 hours.  New?Medications Started at [facility] Discharge:?? -started  Apixaban Starter Pack ('10mg'$  and '5mg'$ ) (ELIQUIS STARTER PACK) apixaban Arne [place]) Start taking on: October 14, 2021  Medication Changes at Hospital Discharge: -Changed  none  Medications Discontinued at Hospital Discharge: -Stopped  aspirin EC  81 MG tablet predniSONE 10 MG (21) Tbpk tablet (STERAPRED UNI-PAK 21 TAB) predniSONE 50 MG tablet  Medications that remain the same after Hospital Discharge:??  -All other medications will  remain the same.    Medications: Outpatient Encounter Medications as of 12/23/2021  Medication Sig Note   acetaminophen (TYLENOL) 500 MG tablet Take 500-1,000 mg by mouth every 6 (six) hours as needed for moderate pain or headache. (Patient not taking: Reported on 12/12/2021) 12/12/2021: PRN last used over one week ago   albuterol (VENTOLIN HFA) 108 (90 Base) MCG/ACT inhaler Inhale 2 puffs into the lungs every 6 (six) hours as needed for wheezing or shortness of breath. (Patient not taking: Reported on 11/14/2021) 12/12/2021: PRN last use over one month ago   Alcohol Swabs (B-D SINGLE USE SWABS REGULAR) PADS Use twice a day when checking blood sugars    apixaban (ELIQUIS) 5 MG TABS tablet Take 1 tablet (5 mg total) by mouth 2 (two) times daily. Start after you finish starter pack 12/12/2021: Tomorrow is last day of treatment   bisoprolol-hydrochlorothiazide (ZIAC) 10-6.25 MG tablet TAKE 1 TABLET EVERY DAY    CALCIUM ACETATE-MAGNESIUM CARB PO Take 2 tablets by mouth daily. (Patient not taking: Reported on 12/12/2021) 12/12/2021: Not taking since she has been on abx.    cetirizine (ZYRTEC) 10 MG tablet Take 10 mg by mouth daily.    clobetasol ointment (TEMOVATE) 2.42 % 1 application. once a week. 09/23/2021: Using once every 3 weeks   doxycycline (VIBRA-TABS) 100 MG tablet Take 1 tablet (100 mg total) by mouth 2 (two) times daily. 12/12/2021: Two more dose tonight and tomorrow.     furosemide (LASIX) 20 MG tablet Take 1 tablet (20 mg total) by mouth daily. (Patient not taking: Reported on 12/12/2021) 12/12/2021: PRN last taken two weeks ago   glucose blood test strip 1 each by Other route daily. Use as instructed    guaiFENesin (MUCINEX) 600 MG 12 hr tablet Take 600 mg by mouth at bedtime.    Lancets MISC 1 each by Does  not apply route daily.    metFORMIN (GLUCOPHAGE-XR) 500 MG 24 hr tablet Take 1 tablet (500 mg total) by mouth daily with breakfast.    montelukast (SINGULAIR) 10 MG tablet Take 1 tablet (10 mg total) by mouth at bedtime.    Semaglutide,0.25 or 0.'5MG'$ /DOS, (OZEMPIC, 0.25 OR 0.5 MG/DOSE,) 2 MG/1.5ML SOPN Inject 0.5 mg into the skin once a week. 09/13/2021: Take on Fridays   simvastatin (ZOCOR) 20 MG tablet TAKE 1 TABLET AT BEDTIME    triamcinolone cream (KENALOG) 0.1 % 1 application. daily as needed (rash or redness). (Patient not taking: Reported on 12/12/2021) 12/12/2021: Prn last used a week ago   umeclidinium-vilanterol (ANORO ELLIPTA) 62.5-25 MCG/ACT AEPB Inhale 1 puff into the lungs daily.    valACYclovir (VALTREX) 500 MG tablet Take 1 tablet (500 mg total) by mouth daily.    No facility-administered encounter medications on file as of 12/23/2021.  Recent Relevant Labs: Lab Results  Component Value Date/Time   HGBA1C 6.3 (H) 12/09/2021 08:55 AM   HGBA1C 6.2 (A) 08/15/2021 11:57 AM   HGBA1C 6.9 (H) 05/06/2021 08:49 AM   MICROALBUR 2.6 08/25/2016 12:14 PM   MICROALBUR 0.3 07/19/2015 12:01 AM    Kidney Function Lab Results  Component Value Date/Time   CREATININE 0.69 12/09/2021 08:55 AM   CREATININE 0.75 10/16/2021 12:23 PM   CREATININE 0.64 09/23/2021 01:17 PM   CREATININE 0.75 03/05/2017 07:36 AM   CREATININE 0.78 02/26/2017 10:38 AM   GFR 60.99 09/13/2021 11:02 AM   GFRNONAA >60 10/16/2021 12:23 PM   GFRAA 97 07/02/2020 11:49 AM    Current antihyperglycemic regimen:  Metformin XR 500  mg 1 tablet three times daily  - appropriate, effective, safe, accessible (Pt using once daily in am) Ozempic 0.5 mg inject once weekly  - appropriate, effective, safe, accessible What recent interventions/DTPs have been made to improve glycemic control:  Patient reports she is using metformin only once daily in the am per provider. Have there been any recent hospitalizations or ED visits since last  visit with CPP? Yes Patient denies hypoglycemic symptoms, including None Patient denies hyperglycemic symptoms, including none How often are you checking your blood sugar? in the morning before eating or drinking What are your blood sugars ranging?  Fasting: 120 130 116 During the week, how often does your blood glucose drop below 70? Patient reports never, is mostly in the 20-30's   Adherence Review: Is the patient currently on a STATIN medication? Yes Is the patient currently on ACE/ARB medication? No Does the patient have >5 day gap between last estimated fill dates? No   Current COPD regimen: Albuterol nebulizer as needed (not using) Anoro Ellipta 62.5-25 mcg/inh 1 puff daily Albuterol HFA 2 puffs every 6 hours as needed    09/30/2021   10:45 AM 10/23/2020   10:45 AM 01/26/2017    2:58 PM 08/08/2016    2:56 PM  CAT Score  Total CAT Score '11 12 10 10   '$ Patient denies COPD symptoms, including Increased shortness of breath , Rescue medicine is not helping, Shortness of breath at rest, Symptoms worse with exercise, Symptoms worse at night, and Wheezing What recent interventions/DTPs have been made by any provider to improve breathing since last visit: Patient reports none Have you had exacerbation/flare-up since last visit? No What do you do when you are short of breath?  Rescue medicationPatient reports she has only had to use once recently because of her allergies  Respiratory Devices/Equipment Do you have a nebulizer? Yes Do you use a Peak Flow Meter? No Do you use a maintenance inhaler? Yes How often do you forget to use your daily inhaler? Patient reports never Do you use a rescue inhaler? Yes How often do you use your rescue inhaler?  prn Do you use a spacer with your inhaler? No  Adherence Review: Does the patient have >5 day gap between last estimated fill date for maintenance inhaler medications? No     Care Gaps: BP- 118/72 11/29/21 AWV- 11/22 CCM- Declined at this  time reports PCP follows up with her regularly Lab Results  Component Value Date   HGBA1C 6.3 (H) 12/09/2021    Star Rating Drugs: Metformin 500 mg - Last filled 03/21/21 with [facility] Verified as accurate (Patient has only been taking 1 daily) Ozempic 0.5 mg  - Last filled Patient Assistance  Simvastatin 20 mg - Last filled 12/12/21 [facility] Pharmacist Assistant [phone]

## 2021-12-30 DIAGNOSIS — Z9911 Dependence on respirator [ventilator] status: Secondary | ICD-10-CM | POA: Diagnosis not present

## 2021-12-30 DIAGNOSIS — H3530 Unspecified macular degeneration: Secondary | ICD-10-CM | POA: Diagnosis not present

## 2022-01-06 ENCOUNTER — Other Ambulatory Visit: Payer: Self-pay | Admitting: *Deleted

## 2022-01-06 MED ORDER — LORAZEPAM 1 MG PO TABS: ORAL_TABLET | ORAL | 0 refills | Status: DC

## 2022-01-08 ENCOUNTER — Ambulatory Visit: Payer: Medicare HMO | Admitting: Pulmonary Disease

## 2022-01-08 ENCOUNTER — Encounter: Payer: Self-pay | Admitting: Pulmonary Disease

## 2022-01-08 VITALS — BP 112/60 | HR 88 | Temp 98.0°F | Ht 62.0 in | Wt 229.2 lb

## 2022-01-08 DIAGNOSIS — G4734 Idiopathic sleep related nonobstructive alveolar hypoventilation: Secondary | ICD-10-CM | POA: Diagnosis not present

## 2022-01-08 DIAGNOSIS — I2699 Other pulmonary embolism without acute cor pulmonale: Secondary | ICD-10-CM

## 2022-01-08 DIAGNOSIS — J449 Chronic obstructive pulmonary disease, unspecified: Secondary | ICD-10-CM

## 2022-01-08 MED ORDER — APIXABAN 5 MG PO TABS: 5.0000 mg | ORAL_TABLET | Freq: Two times a day (BID) | ORAL | 2 refills | Status: DC

## 2022-01-08 MED ORDER — ANORO ELLIPTA 62.5-25 MCG/ACT IN AEPB: 1.0000 | INHALATION_SPRAY | Freq: Every day | RESPIRATORY_TRACT | 0 refills | Status: DC

## 2022-01-08 NOTE — Progress Notes (Signed)
Subjective:   PATIENT ID: Shannon Schultz GENDER: female DOB: [birth year], MRN: [id number]   HPI  Chief Complaint  Patient presents with   Follow-up    Patient feels like her breathing is good, she has no complaints   Shannon Schultz is a 76 year old female with allergic rhinitis moderate COPD, HTN, DM2, AS s/p TAVR 07/2020 who presents for follow-up.  Synopsis: Diagnosis of COPD well-controlled on Anoro. Last COPD flare requiring antibiotics 05/2021.  09/02/2021 Since her last visit she has been compliant with her Anoro and Singulair daily.  She was treated with azithromycin for COPD flare/acute bronchitis in December 2022.  Her last COPD exacerbation for this was 2021. She currently reports mild cough with nasal congestion. Trig[place] pollen [place] She is on zyrtec and Singulair. Otherwise she reports her symptoms are well-controlled. Denies wheezing. No limitations in activity. Compliant with oxygen at night however would want to discontinue this if she no longer needs it.  09/30/21 Since our last visit, she was diagnosed with PE. On the day after her CT chest lung screen, she reports abrupt onset of left sided chest pain. No fevers or cough preceding this. Was seen by PCP and treated with antibiotics. She then developed hemoptysis. After seeing Pulmonary NP CTA was ordered and multiple PE diagnosed however study was suboptimal. Started on Eliquis. Since then she reports improved left chest pain, resolved hemoptysis and improved cough though does complain of upper airway congestion/cough.  01/08/22 She reports she has not been on Eliquis for a month. She is scheduled for surgery with Dr. Donzetta Matters tomorrow for laser ablation. Denies shortness of breath, chest pain, cough, hemoptysis, shortness of breath or wheezing.  Social History: 46 pack years. Quit in 2012. During 2018, she lost her sister to breast cancer in early 2018, was diagnosed with breast cancer s/p  lumpectomy, had bladder cancer and underwent resection and intravesicular chemo, fell and broke her right shoulder, underwent ventral hernia repair with mesh insertion.   Patient Active Problem List   Diagnosis Date Noted   Personal history of breast cancer 11/15/2021   Pulmonary embolism ([place]) 09/14/2021   Hemoptysis 09/13/2021   Pleural effusion 17/40/8144   Diastolic dysfunction 81/85/6314   URI (upper respiratory infection) 09/13/2021   Nocturnal hypoxemia 11/12/2020   S/P TAVR (transcatheter aortic valve replacement) 08/14/2020   Severe aortic stenosis    Contrast media allergy 06/26/2020   AAA (abdominal aortic aneurysm) without rupture 06/26/2020   Upper airway cough syndrome 12/20/2019   Hip fracture ([place]) 08/25/2019   Venous insufficiency 09/10/2018   Sinusitis 06/25/2018   Ophthalmic migraine 03/16/2018   COPD, group B, by GOLD 2017 classification ([place]) 09/30/2017   Chronic hypoxemic respiratory failure ([place]) 09/29/2017   Ventral hernia without obstruction or gangrene 05/15/2017   Cirrhosis of liver without ascites ([place]) 03/11/2017   Urothelial cancer ([place]) 03/10/2017   Atherosclerosis of coronary artery 09/14/2016   Aortic atherosclerosis ([place]) 09/14/2016   Class 2 severe obesity due to excess calories with serious comorbidity and body mass index (BMI) of 39.0 to 39.9 in adult ([place]) 11/10/2015   Thrombocytopenia ([place]) 08/17/2015   Elevated troponin 08/17/2015   Diabetes mellitus type 2 in obese ([place]) 08/17/2015   Severe obesity (BMI >= 40) ([place]) 10/06/2014   Family history BRCA2 gene positive    HSV (herpes simplex virus) infection 06/07/2012   CAP (community acquired pneumonia) 06/06/2011   Pure hypercholesterolemia 11/13/2010   Essential hypertension, benign  11/13/2010    Outpatient Medications Prior to Visit  Medication Sig Dispense Refill   acetaminophen (TYLENOL) 500 MG tablet Take 500-1,000 mg by mouth every 6 (six) hours as needed for moderate pain or  headache.     albuterol (VENTOLIN HFA) 108 (90 Base) MCG/ACT inhaler Inhale 2 puffs into the lungs every 6 (six) hours as needed for wheezing or shortness of breath. 3 each 3   Alcohol Swabs (B-D SINGLE USE SWABS REGULAR) PADS Use twice a day when checking blood sugars 100 each 1   bisoprolol-hydrochlorothiazide (ZIAC) 10-6.25 MG tablet TAKE 1 TABLET EVERY DAY 90 tablet 1   CALCIUM ACETATE-MAGNESIUM CARB PO Take 2 tablets by mouth daily.     cetirizine (ZYRTEC) 10 MG tablet Take 10 mg by mouth daily.     clobetasol ointment (TEMOVATE) 6.94 % 1 application. once a week.     doxycycline (VIBRA-TABS) 100 MG tablet Take 1 tablet (100 mg total) by mouth 2 (two) times daily. 28 tablet 0   furosemide (LASIX) 20 MG tablet Take 1 tablet (20 mg total) by mouth daily. 90 tablet 3   glucose blood test strip 1 each by Other route daily. Use as instructed 100 each 2   guaiFENesin (MUCINEX) 600 MG 12 hr tablet Take 600 mg by mouth at bedtime.     Lancets MISC 1 each by Does not apply route daily. 100 each 2   LORazepam (ATIVAN) 1 MG tablet Take 1 tablet 30 minutes prior to leaving house on day of office surgery.  Bring second tablet with you to office. 2 tablet 0   metFORMIN (GLUCOPHAGE-XR) 500 MG 24 hr tablet Take 1 tablet (500 mg total) by mouth daily with breakfast. 1 tablet 0   montelukast (SINGULAIR) 10 MG tablet Take 1 tablet (10 mg total) by mouth at bedtime. 90 tablet 3   Semaglutide,0.25 or 0.5MG/DOS, (OZEMPIC, 0.25 OR 0.5 MG/DOSE,) 2 MG/1.5ML SOPN Inject 0.5 mg into the skin once a week. 6 mL 0   simvastatin (ZOCOR) 20 MG tablet TAKE 1 TABLET AT BEDTIME 90 tablet 0   triamcinolone cream (KENALOG) 0.1 % 1 application  daily as needed (rash or redness).     umeclidinium-vilanterol (ANORO ELLIPTA) 62.5-25 MCG/ACT AEPB Inhale 1 puff into the lungs daily. 180 each 1   valACYclovir (VALTREX) 500 MG tablet Take 1 tablet (500 mg total) by mouth daily. 90 tablet 3   apixaban (ELIQUIS) 5 MG TABS tablet Take 1  tablet (5 mg total) by mouth 2 (two) times daily. Start after you finish starter pack 60 tablet 1   No facility-administered medications prior to visit.    Review of Systems  Constitutional:  Negative for chills, diaphoresis, fever, malaise/fatigue and weight loss.  HENT:  Negative for congestion.   Respiratory:  Negative for cough, hemoptysis, sputum production, shortness of breath and wheezing.   Cardiovascular:  Negative for chest pain, palpitations and leg swelling.    Objective:   Vitals:   01/08/22 1042  BP: 112/60  Pulse: 88  Temp: 98 F (36.7 C)  TempSrc: Oral  SpO2: 94%  Weight: 229 lb 3.2 oz (104 kg)  Height: _0  (1.575 m)   SpO2: 94 % O2 Device: None (Room air)   Body mass index is 41.92 kg/m.  Physical Exam: General: Well-appearing, no acute distress HENT: [place], AT Eyes: EOMI, no scleral icterus Respiratory: Clear to auscultation bilaterally.  No crackles, wheezing or rales Cardiovascular: RRR, -M/R/G, no JVD Extremities:-Edema,-tenderness Neuro: AAO x4, CNII-XII grossly  intact Psych: Normal mood, normal affect  Data Reviewed:  Imaging Screening CT chest 09/10/16 -moderate emphysema Screening CT 09/16/17-moderate emphysema, scattered subcentimeter pulmonary nodules.  Fatty liver with findings of cirrhosis. Screening CT 11/29/18- interval development of RUL nodule measuring 6.1 mm CT Chest 03/03/19 - slightly decreased RUL nodule measuring 5.3 mm Screening CT chest 03/26/20 - centrilobular emphysema. Scattered tiny pulmonary nodules, stable CTA 07/13/20 Stable pulmonary nodules VQ scan 04/02/2021-no pulmonary embolism CTA 09/13/2021-suboptimal contrast however radiology read concerning for filling defects in bilateral pulmonary arteries including segmental and right middle and lower lobe, lingula and left lower lobe.  Left lower lobe consolidation concerning for pulmonary infarct/pneumonia.  Background emphysema CTA PE 10/14/21 - Resolved segmental emboli. Resolved  LLL consolidation with residual scarring. Moderate centrilobular emphysema. Cirrhosis. Splenomegaly  PFTs 08/28/11 FVC 2.71 [92%], FEV1 1.33 (62%), F/F 49, TLC 114%, DLCO 46% Moderate obstructive defect with moderate reduction in diffusion capacity. No bronchodilator response   11/08/15 FVC 2.05 [7%), FEV1 1.23 (55%), F/F 60 Moderate obstructive defect   09/24/16 FVC 2. (82%), FEV1 1.43 (62%), F/F 58 , TLC 99%, DLCO 54% Moderate obstruction with moderate diffusion defect. No bronchodilator response.   Labs A1AT 08/08/16- 162, PIMM  Sleep study Home sleep study 05/03/19 - AHI 8.8 Nadir SpO2 76% Overnight oximetry 09/24/21 - SpO2 <88% for 44 minutes and 32 seconds. Nadir SpO2 80%. Recommend to continue wearing 2L O2.  Assessment & Plan:  76 year old female former smoker with moderate COPD with emphysema, unprovoked PE on lifelong anticoagulation, allergic rhinitis, OSA not on CPAP, HTN, DM2, AS s/p TAVR 2022 who presents for follow-up. Hematology notes reviewed and recommend lifelong anticoagulation for unprovoked PE. Counseled patient on restarting Eliquis and to keep Hematology follow-up. COPD well-controlled.  Acute pulmonary embolism, unprovoked --RESTART eliquis 5 mg twice daily 24 hours after surgery --Hematology recommends lifelong anticoagulation with plan for maintenance therapy after 6 months  Moderate COPD GOLD B - last exacerbation 05/2021. Currently well-controlled --CONTINUE Anoro ONE puff ONCE a day. REFILL --CONTINUE Albuterol AS NEEDED for shortness of breath or wheezing  Allergic Rhinitis --CONTINUE Zyrtec --CONTINUE Singulair --STOP Flonase due to nose bleeds. Call office for atrovent nasal spray if congestion recurs   Nocturnal hypoxemic respiratory failure secondary to mild OSA  Discussed CPAP vs oral appliance vs weight loss. Prefers to continue oxygen. No symptoms of fatigue, shortness of breath, headaches. Overall asymptomatic. --CONTINUE 2L oxygen via nasal  cannula nightly     Health maintenance  Immunization History  Administered Date(s) Administered   Fluad Quad(high Dose 65+) 04/07/2019, 04/26/2020, 05/08/2021   Influenza Split 05/28/2011   Influenza, High Dose Seasonal PF 04/07/2013, 05/15/2014, 05/30/2015, 03/25/2016, 03/05/2017, 04/01/2018   Influenza, Seasonal, Injecte, Preservative Fre 06/07/2012   PFIZER Comirnaty(Gray Top)Covid-19 Tri-Sucrose Vaccine 10/25/2020   PFIZER(Purple Top)SARS-COV-2 Vaccination 08/20/2019, 09/13/2019, 04/14/2020   Pfizer Covid-19 Vaccine Bivalent Booster 77yr & up 05/08/2021   Pneumococcal Conjugate-13 07/06/2014   Pneumococcal Polysaccharide-23 12/15/2004, 05/28/2011, 05/27/2021   Tdap 02/23/2008, 06/15/2018   Zoster Recombinat (Shingrix) 12/02/2017, 02/06/2018   Zoster, Live 05/17/2010   Annual CT Lung -scheduled for 08/2021  No orders of the defined types were placed in this encounter.   Meds ordered this encounter  Medications   apixaban (ELIQUIS) 5 MG TABS tablet    Sig: Take 1 tablet (5 mg total) by mouth 2 (two) times daily.    Dispense:  60 tablet    Refill:  2   umeclidinium-vilanterol (ANORO ELLIPTA) 62.5-25 MCG/ACT AEPB    Sig:  Inhale 1 puff into the lungs daily.    Dispense:  60 each    Refill:  0    Order Specific Question:   Lot Number?    Answer:   [id number]    Order Specific Question:   Expiration Date?    Answer:   03/31/2023    Order Specific Question:   Manufacturer?    Answer:   [employer] [[id number]]    Order Specific Question:   Quantity    Answer:   2    Return in about 6 months (around 07/11/2022).  I have spent a total time of 32-minutes on the day of the appointment including chart review, data review, collecting history, coordinating care and discussing medical diagnosis and plan with the patient/family. Past medical history, allergies, medications were reviewed. Pertinent imaging, labs and tests included in this note have been reviewed and interpreted independently by  me.  Kieran Nachtigal Rodman Pickle, MD [facility] 01/08/2022 6:11 PM

## 2022-01-08 NOTE — Patient Instructions (Addendum)
Acute pulmonary embolism --RESTART eliquis 5 mg twice daily 24 hours after surgery  Moderate COPD GOLD B - last exacerbation 05/2021. Currently well-controlled --CONTINUE Anoro ONE puff ONCE a day --CONTINUE Albuterol AS NEEDED for shortness of breath or wheezing  Follow-up with me in 6 months

## 2022-01-09 ENCOUNTER — Encounter: Payer: Self-pay | Admitting: Vascular Surgery

## 2022-01-09 ENCOUNTER — Ambulatory Visit: Payer: Medicare HMO | Admitting: Vascular Surgery

## 2022-01-09 VITALS — BP 115/57 | HR 74 | Temp 98.2°F | Resp 16 | Ht 62.0 in | Wt 229.0 lb

## 2022-01-09 DIAGNOSIS — I872 Venous insufficiency (chronic) (peripheral): Secondary | ICD-10-CM

## 2022-01-09 HISTORY — PX: ENDOVENOUS ABLATION SAPHENOUS VEIN W/ LASER: SUR449

## 2022-01-09 NOTE — Progress Notes (Signed)
     Laser Ablation Procedure     Date 01-09-2022   Shannon Schultz  DOB:Jun 18, 1946  Consent signed: Yes      Surgeon: Servando Snare MD   Procedure: Laser Ablation: right Greater Saphenous Vein  BP (!) 115/57 (BP Location: Left Arm, Patient Position: Sitting, Cuff Size: Large)   Pulse 74   Temp 98.2 F (36.8 C) (Temporal)   Resp 16   Ht '5\' 2"'$  (1.575 m)   Wt 229 lb (103.9 kg)   LMP  (LMP Unknown)   SpO2 95%   BMI 41.88 kg/m   Tumescent Anesthesia: 300 cc 0.9% NaCl with 50 cc Lidocaine HCL 1%  and 15 cc 8.4% NaHCO3  Local Anesthesia: 11 cc Lidocaine HCL and NaHCO3 (ratio 2:1)  7 watts continuous mode     Total energy: 1335 Joules     Total time: 190 seconds  Treatment Length 35 cm   Laser Fiber Ref. # [id number]    Lot #  [id number]     Patient tolerated procedure well  Notes:  All staff members wore facial masks.  [name] took Ativan 1 mg on 01-09-2022 at 9:20 AM and at 10:48 AM.  [name] states her last dose of Eliquis was on 12-14-2021.    Description of Procedure:  After marking the course of the secondary varicosities, the patient was placed on the operating table in the supine position, and the right leg was prepped and draped in sterile fashion.   Local anesthetic was administered and under ultrasound guidance the saphenous vein was accessed with a micro needle and guide wire; then the mirco puncture sheath was placed.  A guide wire was inserted saphenofemoral junction , followed by a 5 french sheath.  The position of the sheath and then the laser fiber below the junction was confirmed using the ultrasound.  Tumescent anesthesia was administered along the course of the saphenous vein using ultrasound guidance. The patient was placed in Trendelenburg position and protective laser glasses were placed on patient and staff, and the laser was fired at 7 watts continuous mode for a total of 1335 joules.     Steri strip was applied to the IV insertion site  and ABD  pads and thigh high compression stockings were applied.  Ace wrap bandages were applied over the right thigh and at the top of the saphenofemoral junction. Blood loss was less than 15 cc.  Discharge instructions reviewed with patient and hardcopy of discharge instructions given to patient to take home. The patient ambulated out of the operating room having tolerated the procedure well.

## 2022-01-09 NOTE — Progress Notes (Signed)
Patient name: Shannon Schultz MRN: [id number] DOB: 11/21/1945 Sex: female  REASON FOR VISIT: Right greater saphenous vein ablation  HPI: Shannon Schultz is a 76 y.o. female with history of C4b venous disease with atrophie blanche.  She has been compliant with compression stockings and is now off of Eliquis after recent PE.  Plan is for right greater saphenous vein ablation today.  Current Outpatient Medications  Medication Sig Dispense Refill   acetaminophen (TYLENOL) 500 MG tablet Take 500-1,000 mg by mouth every 6 (six) hours as needed for moderate pain or headache.     albuterol (VENTOLIN HFA) 108 (90 Base) MCG/ACT inhaler Inhale 2 puffs into the lungs every 6 (six) hours as needed for wheezing or shortness of breath. 3 each 3   Alcohol Swabs (B-D SINGLE USE SWABS REGULAR) PADS Use twice a day when checking blood sugars 100 each 1   apixaban (ELIQUIS) 5 MG TABS tablet Take 1 tablet (5 mg total) by mouth 2 (two) times daily. 60 tablet 2   bisoprolol-hydrochlorothiazide (ZIAC) 10-6.25 MG tablet TAKE 1 TABLET EVERY DAY 90 tablet 1   CALCIUM ACETATE-MAGNESIUM CARB PO Take 2 tablets by mouth daily.     cetirizine (ZYRTEC) 10 MG tablet Take 10 mg by mouth daily.     clobetasol ointment (TEMOVATE) 7.82 % 1 application. once a week.     doxycycline (VIBRA-TABS) 100 MG tablet Take 1 tablet (100 mg total) by mouth 2 (two) times daily. 28 tablet 0   furosemide (LASIX) 20 MG tablet Take 1 tablet (20 mg total) by mouth daily. 90 tablet 3   glucose blood test strip 1 each by Other route daily. Use as instructed 100 each 2   guaiFENesin (MUCINEX) 600 MG 12 hr tablet Take 600 mg by mouth at bedtime.     Lancets MISC 1 each by Does not apply route daily. 100 each 2   metFORMIN (GLUCOPHAGE-XR) 500 MG 24 hr tablet Take 1 tablet (500 mg total) by mouth daily with breakfast. 1 tablet 0   montelukast (SINGULAIR) 10 MG tablet Take 1 tablet (10 mg total) by mouth at bedtime. 90 tablet 3   Semaglutide,0.25 or  0.'5MG'$ /DOS, (OZEMPIC, 0.25 OR 0.5 MG/DOSE,) 2 MG/1.5ML SOPN Inject 0.5 mg into the skin once a week. 6 mL 0   simvastatin (ZOCOR) 20 MG tablet TAKE 1 TABLET AT BEDTIME 90 tablet 0   triamcinolone cream (KENALOG) 0.1 % 1 application  daily as needed (rash or redness).     umeclidinium-vilanterol (ANORO ELLIPTA) 62.5-25 MCG/ACT AEPB Inhale 1 puff into the lungs daily. 180 each 1   umeclidinium-vilanterol (ANORO ELLIPTA) 62.5-25 MCG/ACT AEPB Inhale 1 puff into the lungs daily. 60 each 0   valACYclovir (VALTREX) 500 MG tablet Take 1 tablet (500 mg total) by mouth daily. 90 tablet 3   LORazepam (ATIVAN) 1 MG tablet Take 1 tablet 30 minutes prior to leaving house on day of office surgery.  Bring second tablet with you to office. (Patient not taking: Reported on 01/09/2022) 2 tablet 0   No current facility-administered medications for this visit.    PHYSICAL EXAM: Vitals:   01/09/22 1102  BP: (!) 115/57  Pulse: 74  Resp: 16  Temp: 98.2 F (36.8 C)  TempSrc: Temporal  SpO2: 95%  Weight: 229 lb (103.9 kg)  Height: '5\' 2"'$  (1.575 m)   HENT:     Head: Normocephalic.     Nose: Nose normal.  Eyes:     Pupils:  Pupils are equal, round, and reactive to light.  Cardiovascular:     Rate and Rhythm: Normal rate.     Pulses: Normal pulses.  Abdominal:     General: Abdomen is flat.     Palpations: Abdomen is soft.  Musculoskeletal:     Cervical back: Normal range of motion and neck supple.     Right lower leg: Edema present.  Skin:    General: Skin is warm and dry.     Capillary Refill: Capillary refill takes less than 2 seconds.     Comments: Skin changes consistent with C4 venous disease on the right.  Neurological:     General: No focal deficit present.     Mental Status: She is alert.   PROCEDURE: Endovenous laser ablation right greater saphenous vein totaling  35 cm    TECHNIQUE: The patient's great saphenous vein on the right was marked with her in the supine position.  She was then  prepped and draped in the right lower extremity in usual fashion a timeout was called and we all confirmed right lower extremity as the correct side.  The vein was then identified with ultrasound and the area was anesthetized with 1% lidocaine and this was cannulated just above the knee with a micropuncture needle followed by wire and a micropuncture sheath.  A J-wire was then placed 2.5 cm from the saphenofemoral junction which was easily visualized.  A 45cm laser sheath was then brought and placed just to the level of the J-wire 2.5 cm from the saphenofemoral junction.  We then anesthetized with tumescent anesthesia along the level of the expected laser path and then performed laser ablation for a total of 35 cm.  At completion the common femoral vein was patent and easily compressed and the greater saphenous vein did appear sclerotic throughout its course all the way up to the junction. A sterile compressive dressing was placed.  She tolerated the procedure well without immediate complication.       Servando Snare [facility] Servando Snare [facility]

## 2022-01-15 ENCOUNTER — Ambulatory Visit (HOSPITAL_COMMUNITY)
Admission: RE | Admit: 2022-01-15 | Discharge: 2022-01-15 | Disposition: A | Discharge status: Home / Self Care | Payer: Medicare HMO | Source: Ambulatory Visit | Attending: Vascular Surgery | Admitting: Vascular Surgery

## 2022-01-15 ENCOUNTER — Ambulatory Visit (INDEPENDENT_AMBULATORY_CARE_PROVIDER_SITE_OTHER): Payer: Medicare HMO | Admitting: Vascular Surgery

## 2022-01-15 ENCOUNTER — Telehealth: Payer: Self-pay | Admitting: *Deleted

## 2022-01-15 ENCOUNTER — Encounter: Payer: Self-pay | Admitting: Vascular Surgery

## 2022-01-15 VITALS — BP 164/80 | HR 85 | Temp 93.0°F | Resp 20 | Ht 62.0 in | Wt 229.0 lb

## 2022-01-15 DIAGNOSIS — I872 Venous insufficiency (chronic) (peripheral): Secondary | ICD-10-CM

## 2022-01-15 NOTE — Telephone Encounter (Signed)
Patient scheduled for 01/23/22.

## 2022-01-15 NOTE — Progress Notes (Signed)
Patient ID: Shannon Schultz, female   DOB: Nov 25, 1945, 76 y.o.   MRN: [id number]  Reason for Consult: No chief complaint on file.   Referred by [name], MD  Subjective:     HPI:  Shannon Schultz is a 76 y.o. female status post right greater saphenous vein ablation for C4 venous disease.  She does have some residual swelling at the cannulation site but has been compliant with her compression stockings bilaterally today not having very much pain at all.  She remains on anticoagulation for previous history of pulmonary embolus.  Past Medical History:  Diagnosis Date   Arthritis    "hands" (05/15/2017)   Bladder cancer ([place]) 2018   Breast cancer, right ([place]) 1992   DCIS,bladder ca (just dx)   Colon polyp    Complication of anesthesia 1992   "local anesthesia" used was hard to awaken from-no problems since (05/15/2017)   COPD (chronic obstructive pulmonary disease) ([place])    Coronary artery disease    COVID-19 virus infection 05/05/2019   Diverticulosis    Elevated liver enzymes    fatty liver per ultrasound per pt   FHx: BRCA2 gene positive    sister with BRCA2 mutation (pt tested NEGATIVE)   HLD (hyperlipidemia)    HSV (herpes simplex virus) infection    on hip--on daily suppression   Hypertension    Hypothyroidism    took med 7 yrs after birth of 1st child   Impaired glucose tolerance    Migraine    On home oxygen therapy    "have it available but I'm not using it" (05/15/2017)   Osteopenia    Personal history of breast cancer 11/15/2021   S/P TAVR (transcatheter aortic valve replacement) 08/14/2020   s/p TAVR with a 26 mm Edwards S3U via the left subclavian approach by Dr. Roxy Manns and Dr. Angelena Form   Severe aortic stenosis    Sleep apnea    Sleeps with 2 L O2 @ night   Type 2 diabetes mellitus ([place])    Ventral hernia    Vitamin D deficiency disease    Family History  Problem Relation Age of Onset   Heart disease Mother        tachycardia   Heart disease Father     Diabetes Father    Hypertension Father    Asthma Sister    Allergies Sister    Hyperlipidemia Sister    Hashimoto's thyroiditis Sister    Fibromyalgia Sister    Skin cancer Sister        leg; dx after 65; surgery only   Asthma Sister    Allergies Sister    Hyperlipidemia Sister    Hashimoto's thyroiditis Sister    Fibromyalgia Sister    Breast cancer Sister 69       BRCA2 positive; metastatic in late 17s   Cancer Maternal Grandmother        deceased 66; unk. primary; possibly stomach   Tuberculosis Maternal Grandfather    Stroke Paternal Grandmother 82       died of cerebral hemorrhage   Cancer Other        unknown GYN cancers; MGM's sisters   Other Niece        BRCA2 positive; prophylatic mastectomy   Past Surgical History:  Procedure Laterality Date   ABDOMINAL HERNIA REPAIR  05/15/2017   BREAST BIOPSY Right 1992   BREAST BIOPSY Left 2018   BREAST EXCISIONAL BIOPSY Left 2018   ATYPICAL DUCTAL HYPERPLASIA INVOLVING  A COMPLEX   BREAST LUMPECTOMY WITH RADIOACTIVE SEED LOCALIZATION Left 12/22/2016   Procedure: LEFT BREAST LUMPECTOMY WITH RADIOACTIVE SEED LOCALIZATION;  Surgeon: Jovita Kussmaul, MD;  Location: [facility];  Service: General;  Laterality: Left;   CARDIAC CATHETERIZATION     CATARACT EXTRACTION, BILATERAL Bilateral R 03/16/2019 L 03/30/2019   Dr. Kathlen Mody   COLONOSCOPY  2009, 08/2010, 12/2015   Dr. Collene Mares; "only 1 had any polyps" (05/15/2017)   CYSTOSCOPY  04/23/2017   CYSTOSCOPY W/ RETROGRADES Bilateral 01/12/2017   Procedure: CYSTOSCOPY WITH RETROGRADE PYELOGRAM/ EXAM UNDER ANESTHESIA;  Surgeon: Raynelle Bring, MD;  Location: [facility];  Service: Urology;  Laterality: Bilateral;   ENDOVENOUS ABLATION SAPHENOUS VEIN W/ LASER Right 01/09/2022   endovenous laser ablation right greater saphenous vein by Servando Snare MD   EYE SURGERY     FEMUR IM NAIL Right 08/24/2019   Procedure: INTRAMEDULLARY (IM) NAIL FEMORAL;  Surgeon: Rod Can, MD;  Location: [facility];  Service:  Orthopedics;  Laterality: Right;   HERNIA REPAIR     INSERTION OF MESH N/A 05/15/2017   Procedure: INSERTION OF MESH;  Surgeon: Jovita Kussmaul, MD;  Location: [facility];  Service: General;  Laterality: N/A;   LAPAROSCOPIC CHOLECYSTECTOMY  2003   MASTECTOMY Right 1992   RIGHT/LEFT HEART CATH AND CORONARY ANGIOGRAPHY N/A 07/05/2020   Procedure: RIGHT/LEFT HEART CATH AND CORONARY ANGIOGRAPHY;  Surgeon: Burnell Blanks, MD;  Location: [facility];  Service: Cardiovascular;  Laterality: N/A;   TEE WITHOUT CARDIOVERSION N/A 08/14/2020   Procedure: TRANSESOPHAGEAL ECHOCARDIOGRAM (TEE);  Surgeon: Burnell Blanks, MD;  Location: [facility];  Service: Open Heart Surgery;  Laterality: N/A;   TRANSURETHRAL RESECTION OF BLADDER TUMOR WITH MITOMYCIN-C N/A 01/12/2017   Procedure: TRANSURETHRAL RESECTION OF BLADDER TUMOR WITH POSSIBLE POST OPERATIVE INSTILLATION OF MITOMYCIN-C;  Surgeon: Raynelle Bring, MD;  Location[facility];  Service: Urology;  Laterality: N/A;   TYMPANOSTOMY TUBE PLACEMENT Bilateral 4034V   UMBILICAL HERNIA REPAIR  2003   VENTRAL HERNIA REPAIR N/A 05/15/2017   Procedure: VENTRAL HERNIA REPAIR WITH MESH;  Surgeon: Jovita Kussmaul, MD;  Location: [facility];  Service: General;  Laterality: N/A;    Short Social History:  Social History   Tobacco Use   Smoking status: Former    Packs/day: 1.00    Years: 46.00    Total pack years: 46.00    Types: Cigarettes    Quit date: 05/31/2011    Years since quitting: 10.6   Smokeless tobacco: Never  Substance Use Topics   Alcohol use: No    Allergies  Allergen Reactions   Contrast Media [Iodinated Contrast Media] Anaphylaxis, Shortness Of Breath and Other (See Comments)    Could not breath   Iohexol Anaphylaxis, Shortness Of Breath and Other (See Comments)    Immediately could not breathe   Lisinopril Anaphylaxis, Shortness Of Breath and Rash   Sulfa Antibiotics Anaphylaxis and Other (See Comments)    Historical from mother, pt  states that mother says she almost died from this drug   Latex Other (See Comments)    Unless against on skin for a long time, blisters. Short term is okay.   Codeine Nausea Only, Anxiety and Other (See Comments)    insomnia   Levofloxacin Other (See Comments)    insomnia   Lipitor [Atorvastatin Calcium] Rash   Meloxicam Rash    Broke out in a rash on her stomach,back, legs, and behind ears.   Nickel Other (See Comments)  With earrings pt has soreness and drainage from piercing   Vicodin [Hydrocodone-Acetaminophen] Itching    Current Outpatient Medications  Medication Sig Dispense Refill   acetaminophen (TYLENOL) 500 MG tablet Take 500-1,000 mg by mouth every 6 (six) hours as needed for moderate pain or headache.     albuterol (VENTOLIN HFA) 108 (90 Base) MCG/ACT inhaler Inhale 2 puffs into the lungs every 6 (six) hours as needed for wheezing or shortness of breath. 3 each 3   Alcohol Swabs (B-D SINGLE USE SWABS REGULAR) PADS Use twice a day when checking blood sugars 100 each 1   apixaban (ELIQUIS) 5 MG TABS tablet Take 1 tablet (5 mg total) by mouth 2 (two) times daily. (Patient not taking: Reported on 01/09/2022) 60 tablet 2   bisoprolol-hydrochlorothiazide (ZIAC) 10-6.25 MG tablet TAKE 1 TABLET EVERY DAY 90 tablet 1   CALCIUM ACETATE-MAGNESIUM CARB PO Take 2 tablets by mouth daily.     cetirizine (ZYRTEC) 10 MG tablet Take 10 mg by mouth daily.     clobetasol ointment (TEMOVATE) 5.18 % 1 application. once a week.     doxycycline (VIBRA-TABS) 100 MG tablet Take 1 tablet (100 mg total) by mouth 2 (two) times daily. 28 tablet 0   furosemide (LASIX) 20 MG tablet Take 1 tablet (20 mg total) by mouth daily. 90 tablet 3   glucose blood test strip 1 each by Other route daily. Use as instructed 100 each 2   guaiFENesin (MUCINEX) 600 MG 12 hr tablet Take 600 mg by mouth at bedtime.     Lancets MISC 1 each by Does not apply route daily. 100 each 2   LORazepam (ATIVAN) 1 MG tablet Take 1  tablet 30 minutes prior to leaving house on day of office surgery.  Bring second tablet with you to office. (Patient not taking: Reported on 01/09/2022) 2 tablet 0   metFORMIN (GLUCOPHAGE-XR) 500 MG 24 hr tablet Take 1 tablet (500 mg total) by mouth daily with breakfast. 1 tablet 0   montelukast (SINGULAIR) 10 MG tablet Take 1 tablet (10 mg total) by mouth at bedtime. 90 tablet 3   Semaglutide,0.25 or 0.5MG/DOS, (OZEMPIC, 0.25 OR 0.5 MG/DOSE,) 2 MG/1.5ML SOPN Inject 0.5 mg into the skin once a week. 6 mL 0   simvastatin (ZOCOR) 20 MG tablet TAKE 1 TABLET AT BEDTIME 90 tablet 0   triamcinolone cream (KENALOG) 0.1 % 1 application  daily as needed (rash or redness).     umeclidinium-vilanterol (ANORO ELLIPTA) 62.5-25 MCG/ACT AEPB Inhale 1 puff into the lungs daily. 180 each 1   umeclidinium-vilanterol (ANORO ELLIPTA) 62.5-25 MCG/ACT AEPB Inhale 1 puff into the lungs daily. 60 each 0   valACYclovir (VALTREX) 500 MG tablet Take 1 tablet (500 mg total) by mouth daily. 90 tablet 3   No current facility-administered medications for this visit.    Review of Systems  Constitutional:  Constitutional negative. HENT: HENT negative.  Eyes: Eyes negative.  Respiratory: Respiratory negative.  Cardiovascular: Positive for leg swelling.  GI: Gastrointestinal negative.  Skin: Skin negative.  Neurological: Neurological negative. Hematologic: Hematologic/lymphatic negative.  Psychiatric: Psychiatric negative.        Objective:  Objective   Vitals:   01/15/22 1033  BP: (!) 164/80  Pulse: 85  Resp: 20  Temp: (!) 93 F (33.9 C)  SpO2: 95%     Physical Exam HENT:     Head: Normocephalic.     Nose: Nose normal.  Eyes:     Pupils: Pupils are equal, round,  and reactive to light.  Cardiovascular:     Rate and Rhythm: Normal rate.     Pulses: Normal pulses.  Pulmonary:     Effort: Pulmonary effort is normal.  Abdominal:     General: Abdomen is flat.     Palpations: Abdomen is soft.  Skin:     Comments: Both legs have atrophie blanche  Neurological:     General: No focal deficit present.     Mental Status: She is alert.  Psychiatric:        Mood and Affect: Mood normal.        Thought Content: Thought content normal.     Data: RIGHT CompressibilityPhasicitySpontaneityPropertiesThrombus Aging  +------+---------------+---------+-----------+----------+--------------+  CFV   Full           Yes      Yes                                  +------+---------------+---------+-----------+----------+--------------+  FV MidFull           Yes      Yes                                  +------+---------------+---------+-----------+----------+--------------+  POP   Full           Yes      Yes                                  +------+---------------+---------+-----------+----------+--------------+  GSV   None                                                         +------+---------------+---------+-----------+----------+--------------+               Post Ablation Summary:  - No evidence of deep vein thrombosis from the right common femoral  through the popliteal veins.  - Successful ablation of the right Great Saphenous Vein from the proximal  thigh to knee.  - Distance from SFJ to ablated segment of great saphenous vein is 2.03 cm.      Assessment/Plan:    76 year old female with history of right greater saphenous vein ablation which is ablated up to 2 cm from the junction with no evidence of DVT.  She does have atrophie blanche on the left side as well and is wearing compression stockings.  I will see her back in 3 to 4 weeks with left lower extremity venous reflux testing to discuss need for saphenous vein ablation.  She will continue compression stockings bilaterally.     [name] MD [facility]

## 2022-01-15 NOTE — Telephone Encounter (Signed)
Patient called and stated that every time she gets anxious she "breaks out". She said that in the recent past you had offered for her take a medication for anxiety to help but she had declined-she would like to know if she could start this know and if she needs a visit or would you be able to call in?

## 2022-01-15 NOTE — Telephone Encounter (Signed)
I do not see any recent discussion about this. Would need to be a visit.

## 2022-01-16 ENCOUNTER — Ambulatory Visit (INDEPENDENT_AMBULATORY_CARE_PROVIDER_SITE_OTHER): Payer: Medicare HMO | Admitting: Family Medicine

## 2022-01-16 ENCOUNTER — Encounter: Payer: Self-pay | Admitting: Family Medicine

## 2022-01-16 VITALS — BP 130/80 | HR 79 | Wt 230.6 lb

## 2022-01-16 DIAGNOSIS — F411 Generalized anxiety disorder: Secondary | ICD-10-CM | POA: Diagnosis not present

## 2022-01-16 DIAGNOSIS — L309 Dermatitis, unspecified: Secondary | ICD-10-CM | POA: Diagnosis not present

## 2022-01-16 MED ORDER — ESCITALOPRAM OXALATE 10 MG PO TABS: 10.0000 mg | ORAL_TABLET | Freq: Every day | ORAL | 1 refills | Status: DC

## 2022-01-16 MED ORDER — ALPRAZOLAM 0.25 MG PO TABS: 0.2500 mg | ORAL_TABLET | Freq: Two times a day (BID) | ORAL | 0 refills | Status: DC | PRN

## 2022-01-16 NOTE — Progress Notes (Signed)
Chief Complaint  Patient presents with   Acute Visit    Anxiety and eczema concerns    Patient presents to discuss possible treatment for anxiety. She states she has always been an anxious person. Never felt like needed meds until recently.  She states that her eczema has been flaring and not resolving, triggered by anxiety. When she gets upset/anxious, her skin gets very itchy. Prefers trial of anxiety medication rather than the eczema medications (she has read about the side effects of meds for eczema on the commercials, and she doesn't want to take these). She sometimes feels overwhelmed/anxious regarding her health issues.  She became somewhat tearful when discussing her recent diagnosis of dry macular degeneration, with some worsening vision (things appear dimmer).  She recalls that as a child, she would get so excited she would get sick (IBS, with constipation) No GI problems related to stress as an adult.  Regarding her eczema, the person she had been seeing left the derm practice.  When she learned this, she started itching immediately. She did find another derm, has an appt next month (8/8 at [facility] in [place]). She also got very itchy when her husband snapped at her about something minor. She reports the eczema has been flaring since last August, worsens when stressed about things like surgeries. Doesn't want oral meds for eczema (commercial on TV, didn't like the SE) Steroids cream are temporary--not healing it. Using Eucerin regularly.   She recently underwent right greater saphenous vein ablation. Doctor was pleased with the results.  Plans to undergo left lower extremity venous reflux testing to discuss need for saphenous vein ablation.   She continues to wear compression stockings bilaterally (only wearing on the right today, due to the heat).  It was recommended to have lifelong anticoagulation for unprovoked PE. She denies any bleeding. She recently saw pulm, and  COPD is stable.   PMH, PSH, SH reviewed  Outpatient Encounter Medications as of 01/16/2022  Medication Sig Note   acetaminophen (TYLENOL) 500 MG tablet Take 500-1,000 mg by mouth every 6 (six) hours as needed for moderate pain or headache. 12/12/2021: PRN last used over one week ago   albuterol (VENTOLIN HFA) 108 (90 Base) MCG/ACT inhaler Inhale 2 puffs into the lungs every 6 (six) hours as needed for wheezing or shortness of breath. 12/12/2021: PRN last use over one month ago   Alcohol Swabs (B-D SINGLE USE SWABS REGULAR) PADS Use twice a day when checking blood sugars    ALPRAZolam (XANAX) 0.25 MG tablet Take 1 tablet (0.25 mg total) by mouth 2 (two) times daily as needed for anxiety.    apixaban (ELIQUIS) 5 MG TABS tablet Take 1 tablet (5 mg total) by mouth 2 (two) times daily.    bisoprolol-hydrochlorothiazide (ZIAC) 10-6.25 MG tablet TAKE 1 TABLET EVERY DAY    cetirizine (ZYRTEC) 10 MG tablet Take 10 mg by mouth daily.    clobetasol ointment (TEMOVATE) 5.03 % 1 application. once a week. 09/23/2021: Using once every 3 weeks   escitalopram (LEXAPRO) 10 MG tablet Take 1 tablet (10 mg total) by mouth daily. Start at 1/2 tablet once daily; increase after 1 week if/when tolerated    furosemide (LASIX) 20 MG tablet Take 1 tablet (20 mg total) by mouth daily. 12/12/2021: PRN last taken two weeks ago   glucose blood test strip 1 each by Other route daily. Use as instructed    guaiFENesin (MUCINEX) 600 MG 12 hr tablet Take 600 mg by mouth  at bedtime.    Lancets MISC 1 each by Does not apply route daily.    metFORMIN (GLUCOPHAGE-XR) 500 MG 24 hr tablet Take 1 tablet (500 mg total) by mouth daily with breakfast.    montelukast (SINGULAIR) 10 MG tablet Take 1 tablet (10 mg total) by mouth at bedtime.    Semaglutide,0.25 or 0.'5MG'$ /DOS, (OZEMPIC, 0.25 OR 0.5 MG/DOSE,) 2 MG/1.5ML SOPN Inject 0.5 mg into the skin once a week. 09/13/2021: Take on Fridays   simvastatin (ZOCOR) 20 MG tablet TAKE 1 TABLET AT BEDTIME     triamcinolone cream (KENALOG) 0.1 % 1 application  daily as needed (rash or redness). 12/12/2021: Prn last used a week ago   umeclidinium-vilanterol (ANORO ELLIPTA) 62.5-25 MCG/ACT AEPB Inhale 1 puff into the lungs daily.    umeclidinium-vilanterol (ANORO ELLIPTA) 62.5-25 MCG/ACT AEPB Inhale 1 puff into the lungs daily.    valACYclovir (VALTREX) 500 MG tablet Take 1 tablet (500 mg total) by mouth daily.    CALCIUM ACETATE-MAGNESIUM CARB PO Take 2 tablets by mouth daily. (Patient not taking: Reported on 01/16/2022)    No facility-administered encounter medications on file as of 01/16/2022.   NOT taking lexapro or alprazolam prior to today's visit  Allergies  Allergen Reactions   Contrast Media [Iodinated Contrast Media] Anaphylaxis, Shortness Of Breath and Other (See Comments)    Could not breath   Iohexol Anaphylaxis, Shortness Of Breath and Other (See Comments)    Immediately could not breathe   Lisinopril Anaphylaxis, Shortness Of Breath and Rash   Sulfa Antibiotics Anaphylaxis and Other (See Comments)    Historical from mother, pt states that mother says she almost died from this drug   Latex Other (See Comments)    Unless against on skin for a long time, blisters. Short term is okay.   Codeine Nausea Only, Anxiety and Other (See Comments)    insomnia   Levofloxacin Other (See Comments)    insomnia   Lipitor [Atorvastatin Calcium] Rash   Meloxicam Rash    Broke out in a rash on her stomach,back, legs, and behind ears.   Nickel Other (See Comments)    With earrings pt has soreness and drainage from piercing   Vicodin [Hydrocodone-Acetaminophen] Itching    ROS: no fever, chills, URI symptoms, chest pain. Breathing is at baseline, controlled. +eczema flaring and itchy skin, anxiety per HPI. Some worsening of vision related to macular degeneration.  No bleeding, bruising. Denies depression.   PHYSICAL EXAM:  BP 130/80   Pulse 79   Wt 230 lb 9.6 oz (104.6 kg)   LMP  (LMP  Unknown)   SpO2 97%   BMI 42.18 kg/m   Wt Readings from Last 3 Encounters:  01/16/22 230 lb 9.6 oz (104.6 kg)  01/15/22 229 lb (103.9 kg)  01/09/22 229 lb (103.9 kg)   Pleasant female, easily tearful in discussing stressors, anxiety. She exhibits full range of affect, intermittently tearful, but laughing and in good spirits as well. Normal eye contact, speech, grooming. She is alert and oriented, normal gai8t. HEENT: conjunctiva and sclera are clear, anicteric, EOMI Extremities: wearing compression stocking on R. Hyperpigmentation with some hypopigmented areas on LLE. Skin: Slightly dry across whole back (mid and lower). R flank--area is more raised, dry/flaky     01/16/2022    3:51 PM  GAD 7 : Generalized Anxiety Score  Nervous, Anxious, on Edge 1  Control/stop worrying 3  Worry too much - different things 3  Trouble relaxing 2  Restless 3  Easily annoyed or irritable 1  Afraid - awful might happen 0  Total GAD 7 Score 13  Anxiety Difficulty Not difficult at all       01/16/2022    3:53 PM 05/08/2021   10:07 AM 10/25/2020   11:39 AM 04/26/2020   10:10 AM 04/07/2019    2:02 PM  Depression screen PHQ 2/9  Decreased Interest 0 0 1 0 0  Down, Depressed, Hopeless 0 1 1 0 0  PHQ - 2 Score 0 1 2 0 0  Altered sleeping 0      Tired, decreased energy 0      Change in appetite 0      Feeling bad or failure about yourself  1      Trouble concentrating 0      Moving slowly or fidgety/restless 2      Suicidal thoughts 0      PHQ-9 Score 3      Difficult doing work/chores Not difficult at all        ASSESSMENT/PLAN:  Generalized anxiety disorder - Interested in meds--start lexapro.xanax sparingly only if incr in anxiety related to meds.  Risks/SE reviewed. Discussed counseling--talking to sister helps - Plan: escitalopram (LEXAPRO) 10 MG tablet, ALPRAZolam (XANAX) 0.25 MG tablet  Eczema, unspecified type - has appt with derm. Cont moisturizer, steroid creams BID prn. Pt feels  itching/eczema is triggered by stress/anxiety  Risks/SE, stress-reduction/counseling recs reviewed in detail.  F/u in 6 weeks, sooner prn.  I spent 40 minutes dedicated to the care of this patient, including pre-visit review of records, face to face time, post-visit ordering of testing and documentation.

## 2022-01-16 NOTE — Patient Instructions (Addendum)
Start escitalopram (lexapro) at 1/2 tablet once daily in the morning. Switch it to evening if it makes you sleepy. After 1 week, if you are tolerating it well, you can increase to the full pill.  If you need to stay on the 1/2 tablet for longer, that's okay.  You can cut it further (1/4 tablet) if you can't tolerate the half pill due to side effects (you would do this just for a few days, and bump the dose up to the 1/2 tablet once the side effects improved).  Use the alprazolam (xanax) if it causes significant increase in anxiety as it get gets into your system. Ultimately the lexapro will be treating your anxiety. Sometimes there can be a very temporarily increase in anxiety when it is first started. Do not drive or drink any alcohol when taking alprazolam.  I encourage you to work on relaxation techniques, "mindfulness", exercise or other calming activities (podcasts, "Calm" app), continue talking with your sister, vs pastor vs therapist).

## 2022-01-22 ENCOUNTER — Other Ambulatory Visit: Payer: Self-pay

## 2022-01-22 DIAGNOSIS — I872 Venous insufficiency (chronic) (peripheral): Secondary | ICD-10-CM

## 2022-01-23 ENCOUNTER — Institutional Professional Consult (permissible substitution): Payer: Medicare HMO | Admitting: Family Medicine

## 2022-02-03 ENCOUNTER — Other Ambulatory Visit: Payer: Self-pay

## 2022-02-03 ENCOUNTER — Telehealth: Payer: Self-pay | Admitting: Family Medicine

## 2022-02-03 DIAGNOSIS — I10 Essential (primary) hypertension: Secondary | ICD-10-CM

## 2022-02-03 MED ORDER — BISOPROLOL-HYDROCHLOROTHIAZIDE 10-6.25 MG PO TABS: 1.0000 | ORAL_TABLET | Freq: Every day | ORAL | 1 refills | Status: DC

## 2022-02-03 NOTE — Telephone Encounter (Signed)
Pt called and is requesting a refill for her ziac Please send to the  [place], [place]

## 2022-02-05 DIAGNOSIS — L308 Other specified dermatitis: Secondary | ICD-10-CM | POA: Diagnosis not present

## 2022-02-05 DIAGNOSIS — I872 Venous insufficiency (chronic) (peripheral): Secondary | ICD-10-CM | POA: Diagnosis not present

## 2022-02-06 ENCOUNTER — Other Ambulatory Visit: Payer: Medicare HMO | Admitting: Vascular Surgery

## 2022-02-25 NOTE — Progress Notes (Unsigned)
No chief complaint on file.   Patient presents for follow-up on anxiety.   At her 7/20 visit her GAD-7 score was 13. She had reported always being anxious, and that her eczema flares over the last year have been triggered by her anxiety. She preferred to try treating anxiety with medication (over taking eczema medications she sees on commercials). She reported sometimes feeling overwhelmed/anxious regarding her health issues, including a recent diagnosis of dry macular degeneration, with some worsening vision (things appear dimmer).  She was started on lexapro at her last visit, and given some alprazolam to use sparingly, for any temporary increase/worsening of anxiety.  UPDATE   Saw dermatologist 8/8 at [facility] in [place].   PMH, PSH, SH reviewed   ROS:   PHYSICAL EXAM:  LMP  (LMP Unknown)   Wt Readings from Last 3 Encounters:  01/16/22 230 lb 9.6 oz (104.6 kg)  01/15/22 229 lb (103.9 kg)  01/09/22 229 lb (103.9 kg)     ASSESSMENT/PLAN:  GAD-7   ?did she see new derm 8/8?

## 2022-02-26 ENCOUNTER — Encounter: Payer: Self-pay | Admitting: Vascular Surgery

## 2022-02-26 ENCOUNTER — Encounter: Payer: Self-pay | Admitting: Family Medicine

## 2022-02-26 ENCOUNTER — Ambulatory Visit: Payer: Medicare HMO | Admitting: Vascular Surgery

## 2022-02-26 ENCOUNTER — Ambulatory Visit (INDEPENDENT_AMBULATORY_CARE_PROVIDER_SITE_OTHER): Payer: Medicare HMO | Admitting: Family Medicine

## 2022-02-26 ENCOUNTER — Ambulatory Visit (HOSPITAL_COMMUNITY)
Admission: RE | Admit: 2022-02-26 | Discharge: 2022-02-26 | Disposition: A | Discharge status: Home / Self Care | Payer: Medicare HMO | Source: Ambulatory Visit | Attending: Vascular Surgery | Admitting: Vascular Surgery

## 2022-02-26 VITALS — BP 122/78 | HR 68 | Ht 62.0 in | Wt 231.8 lb

## 2022-02-26 VITALS — BP 167/80 | HR 71 | Temp 98.6°F | Resp 20 | Ht 62.0 in | Wt 230.0 lb

## 2022-02-26 DIAGNOSIS — I872 Venous insufficiency (chronic) (peripheral): Secondary | ICD-10-CM | POA: Diagnosis not present

## 2022-02-26 DIAGNOSIS — F411 Generalized anxiety disorder: Secondary | ICD-10-CM | POA: Diagnosis not present

## 2022-02-26 DIAGNOSIS — D692 Other nonthrombocytopenic purpura: Secondary | ICD-10-CM

## 2022-02-26 DIAGNOSIS — L309 Dermatitis, unspecified: Secondary | ICD-10-CM | POA: Diagnosis not present

## 2022-02-26 MED ORDER — ESCITALOPRAM OXALATE 10 MG PO TABS: 10.0000 mg | ORAL_TABLET | Freq: Every day | ORAL | 3 refills | Status: DC

## 2022-02-26 NOTE — Progress Notes (Signed)
Patient ID: Shannon Schultz, female   DOB: February 24, 1946, 76 y.o.   MRN: [id number]  Reason for Consult: No chief complaint on file.   Referred by [name], MD  Subjective:     HPI:  Shannon Schultz is a 76 y.o. female has undergone right greater saphenous vein ablation for C4b venous disease.  At last visit she was noted to have atrophie blanche in the left leg and now presents for evaluation of left lower extremity venous reflux.  Denies any previous history of reflux on the left.  Recently had a PE but now off anticoagulation.  Past Medical History:  Diagnosis Date   Arthritis    "hands" (05/15/2017)   Bladder cancer ([place]) 2018   Breast cancer, right ([place]) 1992   DCIS,bladder ca (just dx)   Colon polyp    Complication of anesthesia 1992   "local anesthesia" used was hard to awaken from-no problems since (05/15/2017)   COPD (chronic obstructive pulmonary disease) ([place])    Coronary artery disease    COVID-19 virus infection 05/05/2019   Diverticulosis    Elevated liver enzymes    fatty liver per ultrasound per pt   FHx: BRCA2 gene positive    sister with BRCA2 mutation (pt tested NEGATIVE)   HLD (hyperlipidemia)    HSV (herpes simplex virus) infection    on hip--on daily suppression   Hypertension    Hypothyroidism    took med 7 yrs after birth of 1st child   Impaired glucose tolerance    Migraine    On home oxygen therapy    "have it available but I'm not using it" (05/15/2017)   Osteopenia    Personal history of breast cancer 11/15/2021   S/P TAVR (transcatheter aortic valve replacement) 08/14/2020   s/p TAVR with a 26 mm Edwards S3U via the left subclavian approach by Dr. Roxy Manns and Dr. Angelena Form   Severe aortic stenosis    Sleep apnea    Sleeps with 2 L O2 @ night   Type 2 diabetes mellitus ([place])    Ventral hernia    Vitamin D deficiency disease    Family History  Problem Relation Age of Onset   Heart disease Mother        tachycardia   Heart disease Father     Diabetes Father    Hypertension Father    Asthma Sister    Allergies Sister    Hyperlipidemia Sister    Hashimoto's thyroiditis Sister    Fibromyalgia Sister    Skin cancer Sister        leg; dx after 28; surgery only   Asthma Sister    Allergies Sister    Hyperlipidemia Sister    Hashimoto's thyroiditis Sister    Fibromyalgia Sister    Breast cancer Sister 26       BRCA2 positive; metastatic in late 56s   Cancer Maternal Grandmother        deceased 59; unk. primary; possibly stomach   Tuberculosis Maternal Grandfather    Stroke Paternal Grandmother 87       died of cerebral hemorrhage   Cancer Other        unknown GYN cancers; MGM's sisters   Other Niece        BRCA2 positive; prophylatic mastectomy   Past Surgical History:  Procedure Laterality Date   ABDOMINAL HERNIA REPAIR  05/15/2017   BREAST BIOPSY Right 1992   BREAST BIOPSY Left 2018   BREAST EXCISIONAL BIOPSY Left 2018  ATYPICAL DUCTAL HYPERPLASIA INVOLVING A COMPLEX   BREAST LUMPECTOMY WITH RADIOACTIVE SEED LOCALIZATION Left 12/22/2016   Procedure: LEFT BREAST LUMPECTOMY WITH RADIOACTIVE SEED LOCALIZATION;  Surgeon: Jovita Kussmaul, MD;  Location: [facility];  Service: General;  Laterality: Left;   CARDIAC CATHETERIZATION     CATARACT EXTRACTION, BILATERAL Bilateral R 03/16/2019 L 03/30/2019   Dr. Kathlen Mody   COLONOSCOPY  2009, 08/2010, 12/2015   Dr. Collene Mares; "only 1 had any polyps" (05/15/2017)   CYSTOSCOPY  04/23/2017   CYSTOSCOPY W/ RETROGRADES Bilateral 01/12/2017   Procedure: CYSTOSCOPY WITH RETROGRADE PYELOGRAM/ EXAM UNDER ANESTHESIA;  Surgeon: Raynelle Bring, MD;  Location: [facility];  Service: Urology;  Laterality: Bilateral;   ENDOVENOUS ABLATION SAPHENOUS VEIN W/ LASER Right 01/09/2022   endovenous laser ablation right greater saphenous vein by Servando Snare MD   EYE SURGERY     FEMUR IM NAIL Right 08/24/2019   Procedure: INTRAMEDULLARY (IM) NAIL FEMORAL;  Surgeon: Rod Can, MD;  Location: [facility];  Service:  Orthopedics;  Laterality: Right;   HERNIA REPAIR     INSERTION OF MESH N/A 05/15/2017   Procedure: INSERTION OF MESH;  Surgeon: Jovita Kussmaul, MD;  Location: [facility];  Service: General;  Laterality: N/A;   LAPAROSCOPIC CHOLECYSTECTOMY  2003   MASTECTOMY Right 1992   RIGHT/LEFT HEART CATH AND CORONARY ANGIOGRAPHY N/A 07/05/2020   Procedure: RIGHT/LEFT HEART CATH AND CORONARY ANGIOGRAPHY;  Surgeon: Burnell Blanks, MD;  Location: [facility];  Service: Cardiovascular;  Laterality: N/A;   TEE WITHOUT CARDIOVERSION N/A 08/14/2020   Procedure: TRANSESOPHAGEAL ECHOCARDIOGRAM (TEE);  Surgeon: Burnell Blanks, MD;  Location: [facility];  Service: Open Heart Surgery;  Laterality: N/A;   TRANSURETHRAL RESECTION OF BLADDER TUMOR WITH MITOMYCIN-C N/A 01/12/2017   Procedure: TRANSURETHRAL RESECTION OF BLADDER TUMOR WITH POSSIBLE POST OPERATIVE INSTILLATION OF MITOMYCIN-C;  Surgeon: Raynelle Bring, MD;  Location: [facility];  Service: Urology;  Laterality: N/A;   TYMPANOSTOMY TUBE PLACEMENT Bilateral 9449Q   UMBILICAL HERNIA REPAIR  2003   VENTRAL HERNIA REPAIR N/A 05/15/2017   Procedure: VENTRAL HERNIA REPAIR WITH MESH;  Surgeon: Autumn Messing III, MD;  Location: [facility];  Service: General;  Laterality: N/A;    Short Social History:  Social History   Tobacco Use   Smoking status: Former    Packs/day: 1.00    Years: 46.00    Total pack years: 46.00    Types: Cigarettes    Quit date: 05/31/2011    Years since quitting: 10.7   Smokeless tobacco: Never  Substance Use Topics   Alcohol use: No    Allergies  Allergen Reactions   Contrast Media [Iodinated Contrast Media] Anaphylaxis, Shortness Of Breath and Other (See Comments)    Could not breath   Iohexol Anaphylaxis, Shortness Of Breath and Other (See Comments)    Immediately could not breathe   Lisinopril Anaphylaxis, Shortness Of Breath and Rash   Sulfa Antibiotics Anaphylaxis and Other (See Comments)    Historical from mother, pt  states that mother says she almost died from this drug   Latex Other (See Comments)    Unless against on skin for a long time, blisters. Short term is okay.   Codeine Nausea Only, Anxiety and Other (See Comments)    insomnia   Levofloxacin Other (See Comments)    insomnia   Lipitor [Atorvastatin Calcium] Rash   Meloxicam Rash    Broke out in a rash on her stomach,back, legs, and behind ears.   Nickel Other (See  Comments)    With earrings pt has soreness and drainage from piercing   Vicodin [Hydrocodone-Acetaminophen] Itching    Current Outpatient Medications  Medication Sig Dispense Refill   acetaminophen (TYLENOL) 500 MG tablet Take 500-1,000 mg by mouth every 6 (six) hours as needed for moderate pain or headache.     albuterol (VENTOLIN HFA) 108 (90 Base) MCG/ACT inhaler Inhale 2 puffs into the lungs every 6 (six) hours as needed for wheezing or shortness of breath. 3 each 3   Alcohol Swabs (B-D SINGLE USE SWABS REGULAR) PADS Use twice a day when checking blood sugars 100 each 1   ALPRAZolam (XANAX) 0.25 MG tablet Take 1 tablet (0.25 mg total) by mouth 2 (two) times daily as needed for anxiety. 20 tablet 0   apixaban (ELIQUIS) 5 MG TABS tablet Take 1 tablet (5 mg total) by mouth 2 (two) times daily. 60 tablet 2   bisoprolol-hydrochlorothiazide (ZIAC) 10-6.25 MG tablet Take 1 tablet by mouth daily. 90 tablet 1   CALCIUM ACETATE-MAGNESIUM CARB PO Take 2 tablets by mouth daily. (Patient not taking: Reported on 01/16/2022)     cetirizine (ZYRTEC) 10 MG tablet Take 10 mg by mouth daily.     clobetasol ointment (TEMOVATE) 3.78 % 1 application. once a week.     escitalopram (LEXAPRO) 10 MG tablet Take 1 tablet (10 mg total) by mouth daily. Start at 1/2 tablet once daily; increase after 1 week if/when tolerated 30 tablet 1   furosemide (LASIX) 20 MG tablet Take 1 tablet (20 mg total) by mouth daily. 90 tablet 3   glucose blood test strip 1 each by Other route daily. Use as instructed 100 each 2    guaiFENesin (MUCINEX) 600 MG 12 hr tablet Take 600 mg by mouth at bedtime.     Lancets MISC 1 each by Does not apply route daily. 100 each 2   metFORMIN (GLUCOPHAGE-XR) 500 MG 24 hr tablet Take 1 tablet (500 mg total) by mouth daily with breakfast. 1 tablet 0   montelukast (SINGULAIR) 10 MG tablet Take 1 tablet (10 mg total) by mouth at bedtime. 90 tablet 3   Semaglutide,0.25 or 0.5MG/DOS, (OZEMPIC, 0.25 OR 0.5 MG/DOSE,) 2 MG/1.5ML SOPN Inject 0.5 mg into the skin once a week. 6 mL 0   simvastatin (ZOCOR) 20 MG tablet TAKE 1 TABLET AT BEDTIME 90 tablet 0   triamcinolone cream (KENALOG) 0.1 % 1 application  daily as needed (rash or redness).     umeclidinium-vilanterol (ANORO ELLIPTA) 62.5-25 MCG/ACT AEPB Inhale 1 puff into the lungs daily. 180 each 1   umeclidinium-vilanterol (ANORO ELLIPTA) 62.5-25 MCG/ACT AEPB Inhale 1 puff into the lungs daily. 60 each 0   valACYclovir (VALTREX) 500 MG tablet Take 1 tablet (500 mg total) by mouth daily. 90 tablet 3   No current facility-administered medications for this visit.    Review of Systems  Constitutional:  Constitutional negative. HENT: HENT negative.  Eyes: Eyes negative.  Cardiovascular: Positive for leg swelling.  GI: Gastrointestinal negative.  Musculoskeletal: Positive for leg pain.  Neurological: Neurological negative. Hematologic: Hematologic/lymphatic negative.  Psychiatric: Psychiatric negative.        Objective:  Objective  Vitals:   02/26/22 1132  BP: (!) 167/80  Pulse: 71  Resp: 20  Temp: 98.6 F (37 C)  SpO2: 92%     Physical Exam HENT:     Head: Normocephalic.     Nose: Nose normal.  Eyes:     Pupils: Pupils are equal, round, and reactive  to light.  Pulmonary:     Effort: Pulmonary effort is normal.  Abdominal:     General: Abdomen is flat.     Palpations: Abdomen is soft.  Musculoskeletal:     Right lower leg: Edema present.     Left lower leg: Edema present.  Skin:    General: Skin is warm.      Capillary Refill: Capillary refill takes less than 2 seconds.     Comments: Improved appearing skin on the right lower extremity, left lower extremity medial ankle with atrophie blanche  Neurological:     Mental Status: She is alert.  Psychiatric:        Mood and Affect: Mood normal.        Behavior: Behavior normal.        Thought Content: Thought content normal.     Data: LEFT              Reflux NoRefluxReflux TimeDiameter cmsComments                                        Yes                                             +------------------+---------+------+-----------+------------+-------------  ----+  CFV                         yes   >1 second                                 +------------------+---------+------+-----------+------------+-------------  ----+  FV mid            no                                                        +------------------+---------+------+-----------+------------+-------------  ----+  Popliteal         no                                                        +------------------+---------+------+-----------+------------+-------------  ----+  GSV at [facility]                  yes    >500 ms      0.78                        +------------------+---------+------+-----------+------------+-------------  ----+  GSV prox thigh    no                            0.41                        +------------------+---------+------+-----------+------------+-------------  ----+  GSV mid thigh               yes    >  500 ms      0.44                        +------------------+---------+------+-----------+------------+-------------  ----+  GSV dist thigh    no                            0.52    large branch                                                                feeding the                                                                  posterior  thigh                                                             varicosities        +------------------+---------+------+-----------+------------+-------------  ----+  GSV at knee       no                            0.50                        +------------------+---------+------+-----------+------------+-------------  ----+  SSV Pop Fossa     no                            0.20                        +------------------+---------+------+-----------+------------+-------------  ----+  anterior accessoryno                            0.48                        +------------------+---------+------+-----------+------------+-------------  ----+           Summary:  Left:  - No evidence of deep vein thrombosis from the common femoral through the  popliteal veins.  - No evidence of superficial venous thrombosis.  - The common femoral vein is not competent.  - The great saphenous vein is not competent.  - The small saphenous vein is competent.  Of note: reflux at the [facility] and mid thigh GSV could only be demonstrated  with manuel compression of the abdomen; valsalva and distal augmentation  did not produce reflux.      Assessment/Plan:    76 year old female status post right greater saphenous vein ablation with concern for venous reflux on the left with atrophie blanche.  Fortunately no need for intervention of veins on the left and  will continue compression stockings told her that she should be okay for just knee-high compression stockings at this point and will follow-up with me on a as needed basis.     [name] MD [facility]

## 2022-02-26 NOTE — Patient Instructions (Signed)
Get the high dose flu shot in September. I encourage you to get the new RSV vaccine--you will need to get this from the pharmacy (covered by medicare part D). I also recommend getting the new/updated bivalent COVID booster when it becomes available in the Fall. (Listen to the news to see when it is available).  Vaccines should be given the same day, or separated by at least 2 weeks. See if you are able to get the flu and RSV vaccines from the pharmacy, and then it should be >2 weeks before the COVID booster is available.  Continue the escitalopram.  I'm so glad that your moods and skin are doing so much better.

## 2022-03-04 NOTE — Progress Notes (Unsigned)
Cardiology Office Note:    Date:  03/05/2022   ID:  Loribeth J Trumpower, DOB 03/03/1946, MRN [id number]  PCP:  Knapp, Eve, MD  Cardiologist:   Mihai Croitoru, MD Electrophysiologist:  None  Referring MD: Knapp, Eve, MD   No chief complaint on file.   History of Present Illness:    Shannon Schultz is a 76 y.o. female with a hx of aortic valve stenosis s/p TAVR (February 2022, 26 mm Edwards Sapien 3 Ultra, L subclavian artery approach), mild nonobstructive CAD (cath January 2022), hypertension, aortic atherosclerosis and 3 cm AAA (very tortuous abdominal aorta with atherosclerosis), severe obesity, type 2 DM and COPD/asthma (45 pack year smoking history, quit 2012).  Had unprovoked acute bilateral pulmonary embolism in March 2023 and is on Eliquis (plan lifelong prophylactic dose starting September 2023). Had right great saphenous vein laser ablation July 2023. Additional problems include OSA on nocturnal O2, not on CPAP; history of breast cancer and urinary bladder cancer, and imaging signs of steatohepatitis with cirrhosis.  Although her dyspnea has improved (can now go with walker to mailbox and back) it has not resolved completely (residual lung disease and obesity).  She was able to enjoy a day and a half at [place], walking up and down the hills without serious issues.  Right leg swelling has improved following the venous ablation procedure performed in July by Dr. Cain.  Still has mild edema towards the end of the day in both legs.  She denies orthopnea, PND, angina at rest or with activity, palpitations, dizziness or syncope.    Labs from June 2023 show good lipid and glycemic control parameters.  Past Medical History:  Diagnosis Date   Arthritis    "hands" (05/15/2017)   Bladder cancer (HCC) 2018   Breast cancer, right (HCC) 1992   DCIS,bladder ca (just dx)   Colon polyp    Complication of anesthesia 1992   "local anesthesia" used was hard to awaken from-no problems since (05/15/2017)    COPD (chronic obstructive pulmonary disease) (HCC)    Coronary artery disease    COVID-19 virus infection 05/05/2019   Diverticulosis    Elevated liver enzymes    fatty liver per ultrasound per pt   FHx: BRCA2 gene positive    sister with BRCA2 mutation (pt tested NEGATIVE)   HLD (hyperlipidemia)    HSV (herpes simplex virus) infection    on hip--on daily suppression   Hypertension    Hypothyroidism    took med 7 yrs after birth of 1st child   Impaired glucose tolerance    Migraine    On home oxygen therapy    "have it available but I'm not using it" (05/15/2017)   Osteopenia    Personal history of breast cancer 11/15/2021   S/P TAVR (transcatheter aortic valve replacement) 08/14/2020   s/p TAVR with a 26 mm Edwards S3U via the left subclavian approach by Dr. Owen and Dr. McAlhany   Severe aortic stenosis    Sleep apnea    Sleeps with 2 L O2 @ night   Type 2 diabetes mellitus (HCC)    Ventral hernia    Vitamin D deficiency disease     Past Surgical History:  Procedure Laterality Date   ABDOMINAL HERNIA REPAIR  05/15/2017   BREAST BIOPSY Right 1992   BREAST BIOPSY Left 2018   BREAST EXCISIONAL BIOPSY Left 2018   ATYPICAL DUCTAL HYPERPLASIA INVOLVING A COMPLEX   BREAST LUMPECTOMY WITH RADIOACTIVE SEED LOCALIZATION Left 12/22/2016     Procedure: LEFT BREAST LUMPECTOMY WITH RADIOACTIVE SEED LOCALIZATION;  Surgeon: Toth, Paul III, MD;  Location: [facility];  Service: General;  Laterality: Left;   CARDIAC CATHETERIZATION     CATARACT EXTRACTION, BILATERAL Bilateral R 03/16/2019 L 03/30/2019   Dr. Weaver   COLONOSCOPY  2009, 08/2010, 12/2015   Dr. Mann; "only 1 had any polyps" (05/15/2017)   CYSTOSCOPY  04/23/2017   CYSTOSCOPY W/ RETROGRADES Bilateral 01/12/2017   Procedure: CYSTOSCOPY WITH RETROGRADE PYELOGRAM/ EXAM UNDER ANESTHESIA;  Surgeon: Borden, Lester, MD;  Location: [facility];  Service: Urology;  Laterality: Bilateral;   ENDOVENOUS ABLATION SAPHENOUS VEIN W/ LASER Right  01/09/2022   endovenous laser ablation right greater saphenous vein by Brandon Cain MD   EYE SURGERY     FEMUR IM NAIL Right 08/24/2019   Procedure: INTRAMEDULLARY (IM) NAIL FEMORAL;  Surgeon: Swinteck, Brian, MD;  Location: [facility];  Service: Orthopedics;  Laterality: Right;   HERNIA REPAIR     INSERTION OF MESH N/A 05/15/2017   Procedure: INSERTION OF MESH;  Surgeon: Toth, Paul III, MD;  Location: [facility];  Service: General;  Laterality: N/A;   LAPAROSCOPIC CHOLECYSTECTOMY  2003   MASTECTOMY Right 1992   RIGHT/LEFT HEART CATH AND CORONARY ANGIOGRAPHY N/A 07/05/2020   Procedure: RIGHT/LEFT HEART CATH AND CORONARY ANGIOGRAPHY;  Surgeon: McAlhany, Christopher D, MD;  Location: [facility];  Service: Cardiovascular;  Laterality: N/A;   TEE WITHOUT CARDIOVERSION N/A 08/14/2020   Procedure: TRANSESOPHAGEAL ECHOCARDIOGRAM (TEE);  Surgeon: McAlhany, Christopher D, MD;  Location: [facility];  Service: Open Heart Surgery;  Laterality: N/A;   TRANSURETHRAL RESECTION OF BLADDER TUMOR WITH MITOMYCIN-C N/A 01/12/2017   Procedure: TRANSURETHRAL RESECTION OF BLADDER TUMOR WITH POSSIBLE POST OPERATIVE INSTILLATION OF MITOMYCIN-C;  Surgeon: Borden, Lester, MD;  Location: [facility];  Service: Urology;  Laterality: N/A;   TYMPANOSTOMY TUBE PLACEMENT Bilateral 1980s   UMBILICAL HERNIA REPAIR  2003   VENTRAL HERNIA REPAIR N/A 05/15/2017   Procedure: VENTRAL HERNIA REPAIR WITH MESH;  Surgeon: Toth, Paul III, MD;  Location: [facility];  Service: General;  Laterality: N/A;    Current Medications: Current Meds  Medication Sig   acetaminophen (TYLENOL) 500 MG tablet Take 500-1,000 mg by mouth every 6 (six) hours as needed for moderate pain or headache.   Alcohol Swabs (B-D SINGLE USE SWABS REGULAR) PADS Use twice a day when checking blood sugars   apixaban (ELIQUIS) 5 MG TABS tablet Take 1 tablet (5 mg total) by mouth 2 (two) times daily.   bisoprolol-hydrochlorothiazide (ZIAC) 10-6.25 MG tablet Take 1 tablet by mouth  daily.   cetirizine (ZYRTEC) 10 MG tablet Take 10 mg by mouth daily.   clobetasol ointment (TEMOVATE) 0.05 % 1 application. once a week.   escitalopram (LEXAPRO) 10 MG tablet Take 1 tablet (10 mg total) by mouth daily. Start at 1/2 tablet once daily; increase after 1 week if/when tolerated   glucose blood test strip 1 each by Other route daily. Use as instructed   guaiFENesin (MUCINEX) 600 MG 12 hr tablet Take 600 mg by mouth at bedtime.   Lancets MISC 1 each by Does not apply route daily.   metFORMIN (GLUCOPHAGE-XR) 500 MG 24 hr tablet Take 1 tablet (500 mg total) by mouth daily with breakfast.   montelukast (SINGULAIR) 10 MG tablet Take 1 tablet (10 mg total) by mouth at bedtime.   Semaglutide,0.25 or 0.5MG/DOS, (OZEMPIC, 0.25 OR 0.5 MG/DOSE,) 2 MG/1.5ML SOPN Inject 0.5 mg into the skin once a week.   simvastatin (  ZOCOR) 20 MG tablet TAKE 1 TABLET AT BEDTIME   triamcinolone cream (KENALOG) 0.1 % 1 application  daily as needed (rash or redness).   umeclidinium-vilanterol (ANORO ELLIPTA) 62.5-25 MCG/ACT AEPB Inhale 1 puff into the lungs daily.   valACYclovir (VALTREX) 500 MG tablet Take 1 tablet (500 mg total) by mouth daily.     Allergies:   Contrast media [iodinated contrast media], Iohexol, Lisinopril, Sulfa antibiotics, Latex, Codeine, Levofloxacin, Lipitor [atorvastatin calcium], Meloxicam, Nickel, and Vicodin [hydrocodone-acetaminophen]   Social History   Socioeconomic History   Marital status: Married    Spouse name: Not on file   Number of children: 3   Years of education: Not on file   Highest education level: Not on file  Occupational History   Occupation: Retired  Tobacco Use   Smoking status: Former    Packs/day: 1.00    Years: 46.00    Total pack years: 46.00    Types: Cigarettes    Quit date: 05/31/2011    Years since quitting: 10.7   Smokeless tobacco: Never  Vaping Use   Vaping Use: Never used  Substance and Sexual Activity   Alcohol use: No   Drug use: No    Sexual activity: Yes    Partners: Male    Birth control/protection: Post-menopausal  Other Topics Concern   Not on file  Social History Narrative   Lives with her husband.  Children all live in [place] nearby. No pets. 6 grandchildren.   Social Determinants of Health   Financial Resource Strain: Low Risk  (04/25/2021)   Overall Financial Resource Strain (CARDIA)    Difficulty of Paying Living Expenses: Not very hard  Food Insecurity: Not on file  Transportation Needs: No Transportation Needs (04/25/2021)   PRAPARE - Hydrologist (Medical): No    Lack of Transportation (Non-Medical): No  Physical Activity: Not on file  Stress: Not on file  Social Connections: Not on file     Family History: The patient's family history includes Allergies in her sister and sister; Asthma in her sister and sister; Breast cancer (age of onset: 46) in her sister; Cancer in her maternal grandmother and another family member; Diabetes in her father; Fibromyalgia in her sister and sister; Hashimoto's thyroiditis in her sister and sister; Heart disease in her father and mother; Hyperlipidemia in her sister and sister; Hypertension in her father; Other in her niece; Skin cancer in her sister; Stroke (age of onset: 13) in her paternal grandmother; Tuberculosis in her maternal grandfather.  ROS:   Please see the history of present illness.    All other systems are reviewed and are negative.   EKGs/Labs/Other Studies Reviewed:    The following studies were reviewed today:  CATH 07/05/2020 Mid Cx lesion is 30% stenosed. Mid LAD lesion is 20% stenosed.   Mild non-obstructive CAD Severe aortic stenosis by echo. Cath data with mean gradient of 34.6 mmHg, peak to peak gradient 44 mmHg).  ECHO 09/14/2021   1. Left ventricular ejection fraction, by estimation, is 55 to 60%. The  left ventricle has normal function. The left ventricle has no regional  wall motion abnormalities. There is  mild concentric left ventricular  hypertrophy. Left ventricular diastolic  parameters are consistent with Grade I diastolic dysfunction (impaired  relaxation).   2. Right ventricular systolic function is normal. The right ventricular  size is normal.   3. Left atrial size was mildly dilated.   4. The mitral valve is normal in  structure. No evidence of mitral valve  regurgitation.   5. The aortic valve has been repaired/replaced. Aortic valve  regurgitation is not visualized. There is a 23 mm Ultra, stented (TAVR)  valve present in the aortic position. Procedure Date: 08/14/2020. Echo  findings are consistent with normal structure and   function of the aortic valve prosthesis. Aortic valve mean gradient  measures 13.0 mmHg. Aortic valve Vmax measures 2.18 m/s. Aortic valve  acceleration time measures 87 msec.   Event monitor 10/03/2020  The baseline rhythm is normal sinus rhythm with normal circadian variation. Rare PACs and PVCs are seen. There are rare episodes of nonsustained supraventricular tachycardia and nonsustained ventricular tachycardia. There are no sustained arrhythmia, no atrial fibrillation and no bradycardia events. Patient symptoms are more commonly associated with VT and PVC events, but also with one SVT event.   Abnormal event monitor due to rare and brief nonsustained atrial tachycardia and ventricular tachycardia. Symptoms are most commonly attributable to nonsustained VT or periods of frequent isolated PVCs.    EKG:  EKG is ordered today.  ECG from 09/14/2021 is personally reviewed shows NSR and old atypical LBBB (QRS 140 ms).  Today's ECG shows very similar findings with normal sinus rhythm and atypical LBBB, QRS 134 ms, leftward axis.  Recent Labs: 04/02/2021: B Natriuretic Peptide 55.4 05/06/2021: TSH 2.170 09/13/2021: Pro B Natriuretic peptide (BNP) 170.0 09/23/2021: Magnesium 2.0 10/16/2021: Hemoglobin 13.4; Platelet Count 113 12/09/2021: ALT 21; BUN 15;  Creatinine, Ser 0.69; Potassium 4.6; Sodium 140  Recent Lipid Panel    Component Value Date/Time   CHOL 118 12/09/2021 0855   TRIG 70 12/09/2021 0855   HDL 54 12/09/2021 0855   CHOLHDL 2.2 12/09/2021 0855   CHOLHDL 2.2 08/18/2016 1346   VLDL 19 08/18/2016 1346   LDLCALC 49 12/09/2021 0855    Physical Exam:    VS:  BP 126/68 (BP Location: Left Arm, Patient Position: Sitting, Cuff Size: Large)   Pulse 86   Ht 5' 2" (1.575 m)   Wt 231 lb 3.2 oz (104.9 kg)   LMP  (LMP Unknown)   SpO2 93%   BMI 42.29 kg/m     Wt Readings from Last 3 Encounters:  03/05/22 231 lb 3.2 oz (104.9 kg)  02/26/22 231 lb 12.8 oz (105.1 kg)  02/26/22 230 lb (104.3 kg)      General: Alert, oriented x3, no distress, severely obese Head: no evidence of trauma, PERRL, EOMI, no exophtalmos or lid lag, no myxedema, no xanthelasma; normal ears, nose and oropharynx Neck: normal jugular venous pulsations and no hepatojugular reflux; brisk carotid pulses without delay.  There is a distinct right carotid bruit. Chest: clear to auscultation, no signs of consolidation by percussion or palpation, normal fremitus, symmetrical and full respiratory excursions Cardiovascular: normal position and quality of the apical impulse, regular rhythm, normal first and second heart sounds, very faint early peaking 1/6 aortic ejection murmur, no diastolic murmurs, rubs or gallops Abdomen: no tenderness or distention, no masses by palpation, no abnormal pulsatility or arterial bruits, normal bowel sounds, no hepatosplenomegaly Extremities: no clubbing, cyanosis or edema; 2+ radial, ulnar and brachial pulses bilaterally; 2+ right femoral, posterior tibial and dorsalis pedis pulses; 2+ left femoral, posterior tibial and dorsalis pedis pulses; no subclavian or femoral bruits Neurological: grossly nonfocal Psych: Normal mood and affect Severely obese    ASSESSMENT:    1. S/P TAVR (transcatheter aortic valve replacement)   2. Aortic  atherosclerosis (HCC)   3. Severe obesity (BMI >=   40) ([place])   4. Diabetes mellitus type 2 in obese ([place])   5. Hypercholesterolemia   6. Essential hypertension, benign   7. Chronic obstructive pulmonary disease, unspecified COPD type ([place])   8. Cirrhosis of liver without ascites, unspecified hepatic cirrhosis type (HCC)      PLAN:    In order of problems listed above:  AS: Improved functional status after successful TAVR.  Normal prosthetic valve function by recent echo.  Understands the need for endocarditis prophylaxis for dental procedures.  Off antiplatelet agents, on long-term anticoagulation for pulm embolism.  Periodic echo follow-up. Aortic atherosclerosis: Really not identified as an aneurysm by the most recent CT performed in January 2022.  She just had extensive tortuosity of the abdominal aorta with atherosclerosis.  No plan for routine imaging follow-up. Obesity: Unfortunately, after losing quite a bit of weight before the pandemic, she is back in morbidly obese range.  On Ozempic for her diabetes. DM: Good glycemic control.  If additional medications are necessary for glycemic control in the future, favor the use of SGLT2 inhibitors in addition to Ozempic, border cardiovascular benefit. HLP: All lipid parameters in excellent range. HTN: A well-controlled. COPD: Explains most of her residual dyspnea.  Reported prior FEV1 of 52%.   Imaging studies suggest cirrhosis: No known history of liver disease, normal albumin and prothrombin time on recent labs, no signs or symptoms of decompensated liver function or portal hypertension.  Suspect that she may have NASH.  Weight loss would be beneficial. Right carotid bruit: Previous carotid duplex ultrasound shows mild plaque without significant obstruction.   Medication Adjustments/Labs and Tests Ordered: Current medicines are reviewed at length with the patient today.  Concerns regarding medicines are outlined above.  Orders Placed This  Encounter  Procedures   EKG 12-Lead    No orders of the defined types were placed in this encounter.    Patient Instructions  Medication Instructions:  No changes *If you need a refill on your cardiac medications before your next appointment, please call your pharmacy*   Lab Work: None ordered If you have labs (blood work) drawn today and your tests are completely normal, you will receive your results only by: [place] (if you have MyChart) OR A paper copy in the mail If you have any lab test that is abnormal or we need to change your treatment, we will call you to review the results.   Testing/Procedures: None ordered   Follow-Up: At [facility], you and your health needs are our priority.  As part of our continuing mission to provide you with exceptional heart care, we have created designated Provider Care Teams.  These Care Teams include your primary Cardiologist (physician) and Advanced Practice Providers (APPs -  Physician Assistants and Nurse Practitioners) who all work together to provide you with the care you need, when you need it.  We recommend signing up for the patient portal called "MyChart".  Sign up information is provided on this After Visit Summary.  MyChart is used to connect with patients for Virtual Visits (Telemedicine).  Patients are able to view lab/test results, encounter notes, upcoming appointments, etc.  Non-urgent messages can be sent to your provider as well.   To learn more about what you can do with MyChart, go to [email or web address].    Your next appointment:   12 month(s)  The format for your next appointment:   In Person  Provider:   Sanda Klein, MD     Important Information About Sugar  Signed, Mihai Croitoru, MD  03/05/2022 9:47 AM    [facility] 

## 2022-03-05 ENCOUNTER — Encounter: Payer: Self-pay | Admitting: Internal Medicine

## 2022-03-05 ENCOUNTER — Ambulatory Visit: Payer: Medicare HMO | Attending: Cardiovascular Disease | Admitting: Cardiovascular Disease

## 2022-03-05 ENCOUNTER — Encounter: Payer: Self-pay | Admitting: Cardiovascular Disease

## 2022-03-05 VITALS — BP 126/68 | HR 86 | Ht 62.0 in | Wt 231.2 lb

## 2022-03-05 DIAGNOSIS — E7800 Pure hypercholesterolemia, unspecified: Secondary | ICD-10-CM | POA: Diagnosis not present

## 2022-03-05 DIAGNOSIS — K7460 Unspecified cirrhosis of liver: Secondary | ICD-10-CM

## 2022-03-05 DIAGNOSIS — E1169 Type 2 diabetes mellitus with other specified complication: Secondary | ICD-10-CM

## 2022-03-05 DIAGNOSIS — I700 Atherosclerosis of aorta: Secondary | ICD-10-CM | POA: Diagnosis not present

## 2022-03-05 DIAGNOSIS — Z952 Presence of prosthetic heart valve: Secondary | ICD-10-CM

## 2022-03-05 DIAGNOSIS — E669 Obesity, unspecified: Secondary | ICD-10-CM | POA: Diagnosis not present

## 2022-03-05 DIAGNOSIS — J449 Chronic obstructive pulmonary disease, unspecified: Secondary | ICD-10-CM | POA: Diagnosis not present

## 2022-03-05 DIAGNOSIS — I10 Essential (primary) hypertension: Secondary | ICD-10-CM | POA: Diagnosis not present

## 2022-03-05 NOTE — Patient Instructions (Signed)
Medication Instructions:  No changes *If you need a refill on your cardiac medications before your next appointment, please call your pharmacy*   Lab Work: None ordered If you have labs (blood work) drawn today and your tests are completely normal, you will receive your results only by: MyChart Message (if you have MyChart) OR A paper copy in the mail If you have any lab test that is abnormal or we need to change your treatment, we will call you to review the results.   Testing/Procedures: None ordered   Follow-Up: At [facility], you and your health needs are our priority.  As part of our continuing mission to provide you with exceptional heart care, we have created designated Provider Care Teams.  These Care Teams include your primary Cardiologist (physician) and Advanced Practice Providers (APPs -  Physician Assistants and Nurse Practitioners) who all work together to provide you with the care you need, when you need it.  We recommend signing up for the patient portal called "MyChart".  Sign up information is provided on this After Visit Summary.  MyChart is used to connect with patients for Virtual Visits (Telemedicine).  Patients are able to view lab/test results, encounter notes, upcoming appointments, etc.  Non-urgent messages can be sent to your provider as well.   To learn more about what you can do with MyChart, go to [email or web address].    Your next appointment:   12 month(s)  The format for your next appointment:   In Person  Provider:   Mihai Croitoru, MD      Important Information About Sugar       

## 2022-03-10 ENCOUNTER — Other Ambulatory Visit: Payer: Self-pay | Admitting: Family Medicine

## 2022-03-10 ENCOUNTER — Telehealth: Payer: Self-pay | Admitting: Pulmonary Disease

## 2022-03-10 DIAGNOSIS — E118 Type 2 diabetes mellitus with unspecified complications: Secondary | ICD-10-CM

## 2022-03-10 MED ORDER — ALBUTEROL SULFATE (2.5 MG/3ML) 0.083% IN NEBU: 2.5000 mg | INHALATION_SOLUTION | Freq: Four times a day (QID) | RESPIRATORY_TRACT | 12 refills | Status: AC | PRN

## 2022-03-10 NOTE — Telephone Encounter (Signed)
Patient requesting albuterol nebulizer solution sent to [facility]- states the pharmacy did not have record of the solution anymore.   Please call when script is called in, patient's number [phone].

## 2022-03-10 NOTE — Telephone Encounter (Signed)
Called and spoke with patient. Patient stated she needed a refill on her nebulizer solution. Patient verified her pharmacy. Rx has been sent to the patients pharmacy.   Nothing further needed.

## 2022-03-17 ENCOUNTER — Encounter: Payer: Self-pay | Admitting: Family Medicine

## 2022-03-17 ENCOUNTER — Ambulatory Visit (INDEPENDENT_AMBULATORY_CARE_PROVIDER_SITE_OTHER): Payer: Medicare HMO | Admitting: Family Medicine

## 2022-03-17 ENCOUNTER — Other Ambulatory Visit: Payer: Medicare HMO

## 2022-03-17 ENCOUNTER — Other Ambulatory Visit: Payer: Self-pay | Admitting: Family Medicine

## 2022-03-17 VITALS — BP 118/60 | HR 80 | Temp 98.5°F | Ht 62.0 in | Wt 230.4 lb

## 2022-03-17 DIAGNOSIS — J441 Chronic obstructive pulmonary disease with (acute) exacerbation: Secondary | ICD-10-CM | POA: Diagnosis not present

## 2022-03-17 DIAGNOSIS — J069 Acute upper respiratory infection, unspecified: Secondary | ICD-10-CM | POA: Diagnosis not present

## 2022-03-17 DIAGNOSIS — F411 Generalized anxiety disorder: Secondary | ICD-10-CM

## 2022-03-17 MED ORDER — PREDNISONE 20 MG PO TABS: 20.0000 mg | ORAL_TABLET | Freq: Two times a day (BID) | ORAL | 0 refills | Status: DC

## 2022-03-17 NOTE — Progress Notes (Unsigned)
Chief Complaint  Patient presents with   Cough    Deep cough, started Sat. No fever chills or body aches. Very fatigued. Did two home covid tests-one yesterday and one today, both negative.   2 nights ago (9/16) she started coughing.  Yesterday the cough got worse, today is even worse. She has some more trouble breathing. She used her nebulizer twice, and used inhaler last night. This helps some. Phlegm is clear in color.   Denies runny nose. Eyes feel a little "burny", no itching or crusting. Denies ear pain. Some sore throat after coughing.  Doesn't think there is PND. Cough is worse in the morning, sleeps fine.  Not coughing too much during the day. Burning in chest after coughing.  Slight headache at temples. Denies sinus pain.  No fevers (98.2 at home, slightly high for her).  Grandchildren were sick last weekend, were with him.  Lab Results  Component Value Date   HGBA1C 6.3 (H) 12/09/2021     BP 118/60   Pulse 80   Temp 98.5 F (36.9 C) (Tympanic)   Ht '5\' 2"'$  (1.575 m)   Wt 230 lb 6.4 oz (104.5 kg)   LMP  (LMP Unknown)   BMI 42.14 kg/m   Pulse ox 94%  Wt Readings from Last 3 Encounters:  03/17/22 230 lb 6.4 oz (104.5 kg)  03/05/22 231 lb 3.2 oz (104.9 kg)  02/26/22 231 lb 12.8 oz (105.1 kg)   Well-appearing female, speaking easily.  Only occasional cough. HEENT: conjunctiva and sclera are clear, noninjected. TM's and EAC's normal. Nasal mucosa is normal, no erythema or purulence.  Small amount of clear mucus on the left.  Sinuses nontender. OP is clear, no erythema. Nontender at temporalis muscles Neck: no lymphadenopathy or mass Heart: regular rate and rhythm with some ectopy noted Lungs: somewhat distant BS posteriorly.  No wheezes, rales.  Ronchi heard at one point cleared with cough--lungs all clear.  Skin: no rash, bruising on L arm (multiple)  Walking with walking--unable to get up on the exam table, legs felt weak.   Stay well hydrated. Increase  your mucinex to '1200mg'$  twice daily. Take Delsym syrup (dextromethorphan) twice daily if needed for coughing. If you aren't getting good results from the nebulizer or albuterol inhaler, then start the prednisone. If your mucus becomes discolored or you have a fever, please let us know and we can start an antibiotic (doxycycline).

## 2022-03-17 NOTE — Patient Instructions (Signed)
Stay well hydrated. Increase your mucinex to '1200mg'$  twice daily. Take Delsym syrup (dextromethorphan) twice daily if needed for coughing. If you aren't getting good results from the nebulizer or albuterol inhaler, then start the prednisone. If your mucus becomes discolored or you have a fever, please let us know and we can start an antibiotic (doxycycline).

## 2022-03-18 ENCOUNTER — Other Ambulatory Visit: Payer: Self-pay

## 2022-03-18 DIAGNOSIS — I2699 Other pulmonary embolism without acute cor pulmonale: Secondary | ICD-10-CM

## 2022-03-19 ENCOUNTER — Inpatient Hospital Stay: Payer: Medicare HMO | Attending: Physician Assistant

## 2022-03-19 ENCOUNTER — Other Ambulatory Visit: Payer: Self-pay

## 2022-03-19 ENCOUNTER — Inpatient Hospital Stay (HOSPITAL_BASED_OUTPATIENT_CLINIC_OR_DEPARTMENT_OTHER): Payer: Medicare HMO | Admitting: Physician Assistant

## 2022-03-19 VITALS — BP 139/59 | HR 82 | Temp 98.4°F | Wt 231.2 lb

## 2022-03-19 DIAGNOSIS — D696 Thrombocytopenia, unspecified: Secondary | ICD-10-CM | POA: Insufficient documentation

## 2022-03-19 DIAGNOSIS — Z87891 Personal history of nicotine dependence: Secondary | ICD-10-CM | POA: Diagnosis not present

## 2022-03-19 DIAGNOSIS — Z79899 Other long term (current) drug therapy: Secondary | ICD-10-CM | POA: Diagnosis not present

## 2022-03-19 DIAGNOSIS — I2699 Other pulmonary embolism without acute cor pulmonale: Secondary | ICD-10-CM | POA: Insufficient documentation

## 2022-03-19 DIAGNOSIS — Z86711 Personal history of pulmonary embolism: Secondary | ICD-10-CM | POA: Diagnosis not present

## 2022-03-19 DIAGNOSIS — D72819 Decreased white blood cell count, unspecified: Secondary | ICD-10-CM | POA: Insufficient documentation

## 2022-03-19 DIAGNOSIS — Z7901 Long term (current) use of anticoagulants: Secondary | ICD-10-CM | POA: Insufficient documentation

## 2022-03-19 LAB — CBC WITH DIFFERENTIAL (CANCER CENTER ONLY)
Abs Immature Granulocytes: 0.01 10*3/uL (ref 0.00–0.07)
Basophils Absolute: 0 10*3/uL (ref 0.0–0.1)
Basophils Relative: 1 %
Eosinophils Absolute: 0.1 10*3/uL (ref 0.0–0.5)
Eosinophils Relative: 2 %
HCT: 39.7 % (ref 36.0–46.0)
Hemoglobin: 12.9 g/dL (ref 12.0–15.0)
Immature Granulocytes: 0 %
Lymphocytes Relative: 22 %
Lymphs Abs: 0.7 10*3/uL (ref 0.7–4.0)
MCH: 29.7 pg (ref 26.0–34.0)
MCHC: 32.5 g/dL (ref 30.0–36.0)
MCV: 91.3 fL (ref 80.0–100.0)
Monocytes Absolute: 0.2 10*3/uL (ref 0.1–1.0)
Monocytes Relative: 8 %
Neutro Abs: 2.1 10*3/uL (ref 1.7–7.7)
Neutrophils Relative %: 67 %
Platelet Count: 101 10*3/uL — ABNORMAL LOW (ref 150–400)
RBC: 4.35 MIL/uL (ref 3.87–5.11)
RDW: 14.6 % (ref 11.5–15.5)
WBC Count: 3.1 10*3/uL — ABNORMAL LOW (ref 4.0–10.5)
nRBC: 0 % (ref 0.0–0.2)

## 2022-03-19 LAB — CMP (CANCER CENTER ONLY)
ALT: 13 U/L (ref 0–44)
AST: 24 U/L (ref 15–41)
Albumin: 3.9 g/dL (ref 3.5–5.0)
Alkaline Phosphatase: 55 U/L (ref 38–126)
Anion gap: 4 — ABNORMAL LOW (ref 5–15)
BUN: 11 mg/dL (ref 8–23)
CO2: 31 mmol/L (ref 22–32)
Calcium: 9 mg/dL (ref 8.9–10.3)
Chloride: 104 mmol/L (ref 98–111)
Creatinine: 0.66 mg/dL (ref 0.44–1.00)
GFR, Estimated: >60 mL/min (ref >60)
Glucose, Bld: 124 mg/dL — ABNORMAL HIGH (ref 70–99)
Potassium: 3.7 mmol/L (ref 3.5–5.1)
Sodium: 139 mmol/L (ref 135–145)
Total Bilirubin: 0.7 mg/dL (ref 0.3–1.2)
Total Protein: 6.3 g/dL — ABNORMAL LOW (ref 6.5–8.1)

## 2022-03-19 NOTE — Progress Notes (Signed)
[place] [phone]   [phone]  PROGRESS NOTE  Patient Care Team: [name], MD as PCP - General (Family Medicine) Sanda Klein, MD as PCP - Cardiology (Cardiology) [name], MD as Referring Physician (Family Medicine) Viona Gilmore, [facility] as Pharmacist (Pharmacist)  Hematological/Oncological History 1) 09/13/2021: Presented to pulmonology team due to cough with hemoptysis.  CTA chest revealed filling defects within bilateral pulmonary arteries that concerning for pulmonary emboli.  This includes a segmental right lower lobe, middle lobe and lingular pulmonary arteries as well as possibly within the left lower lobe and bilateral upper lobe segmental pulmonary arteries.  Patient was advised to go to the emergency room and she was started on heparin infusion.  Bilateral Doppler ultrasound of the lower extremities was negative for DVT.  She was discharged on Eliquis starter pack.  2)  10/14/2021: Repeat CTA chest: Resolved emboli in the segmental arteries.  No evidence of acute pulmonary embolism  3) 10/16/2021: Establish care with [facility]ology  4) 03/19/2022: Recommend to transition to maintenance dose of Eliquis 2.5 mg twice daily  CHIEF COMPLAINTS/PURPOSE OF CONSULTATION:  "Bilateral pulmonary emboli"  HISTORY OF PRESENTING ILLNESS:  Shannon Schultz 76 y.o. female returns for a follow-up for history of bilateral pulmonary emboli.  She was last seen on 10/16/2021 to establish care.  In the interim, she denies any changes to her health.  On exam today, Shannon Schultz reports her energy levels and appetite are stable.  She is able to ambulate with the use of a walker and completes her daily activities on her own.  She denies any weight loss.  She denies nausea, vomiting or abdominal pain.  Her bowel habits are unchanged without any recurrent episodes of diarrhea or constipation.  She does bruise easily, mainly in the upper extremities but denies any  signs of active bleeding.She has chronic lower extremity edema due to venous stasis. She denies fevers, chill, night sweats, shortness of breath, chest pain or cough. She has no other complaints. Rest of the 10 point ROS is below.   MEDICAL HISTORY:  Past Medical History:  Diagnosis Date   Arthritis    "hands" (05/15/2017)   Bladder cancer ([place]) 2018   Breast cancer, right ([place]) 1992   DCIS,bladder ca (just dx)   Colon polyp    Complication of anesthesia 1992   "local anesthesia" used was hard to awaken from-no problems since (05/15/2017)   COPD (chronic obstructive pulmonary disease) (HCC)    Coronary artery disease    COVID-19 virus infection 05/05/2019   Diverticulosis    Elevated liver enzymes    fatty liver per ultrasound per pt   FHx: BRCA2 gene positive    sister with BRCA2 mutation (pt tested NEGATIVE)   HLD (hyperlipidemia)    HSV (herpes simplex virus) infection    on hip--on daily suppression   Hypertension    Hypothyroidism    took med 7 yrs after birth of 1st child   Impaired glucose tolerance    Migraine    On home oxygen therapy    "have it available but I'm not using it" (05/15/2017)   Osteopenia    Personal history of breast cancer 11/15/2021   S/P TAVR (transcatheter aortic valve replacement) 08/14/2020   s/p TAVR with a 26 mm Edwards S3U via the left subclavian approach by Dr. Roxy Manns and Dr. Angelena Form   Severe aortic stenosis    Sleep apnea    Sleeps with 2 L O2 @ night  Type 2 diabetes mellitus (HCC)    Ventral hernia    Vitamin D deficiency disease     SURGICAL HISTORY: Past Surgical History:  Procedure Laterality Date   ABDOMINAL HERNIA REPAIR  05/15/2017   BREAST BIOPSY Right 1992   BREAST BIOPSY Left 2018   BREAST EXCISIONAL BIOPSY Left 2018   ATYPICAL DUCTAL HYPERPLASIA INVOLVING A COMPLEX   BREAST LUMPECTOMY WITH RADIOACTIVE SEED LOCALIZATION Left 12/22/2016   Procedure: LEFT BREAST LUMPECTOMY WITH RADIOACTIVE SEED LOCALIZATION;  Surgeon:  Jovita Kussmaul, MD;  Location: [employer];  Service: General;  Laterality: Left;   CARDIAC CATHETERIZATION     CATARACT EXTRACTION, BILATERAL Bilateral R 03/16/2019 L 03/30/2019   Dr. Kathlen Mody   COLONOSCOPY  2009, 08/2010, 12/2015   Dr. Collene Mares; "only 1 had any polyps" (05/15/2017)   CYSTOSCOPY  04/23/2017   CYSTOSCOPY W/ RETROGRADES Bilateral 01/12/2017   Procedure: CYSTOSCOPY WITH RETROGRADE PYELOGRAM/ EXAM UNDER ANESTHESIA;  Surgeon: Raynelle Bring, MD;  Location: [facility];  Service: Urology;  Laterality: Bilateral;   ENDOVENOUS ABLATION SAPHENOUS VEIN W/ LASER Right 01/09/2022   endovenous laser ablation right greater saphenous vein by Servando Snare MD   EYE SURGERY     FEMUR IM NAIL Right 08/24/2019   Procedure: INTRAMEDULLARY (IM) NAIL FEMORAL;  Surgeon: Rod Can, MD;  Location: [facility];  Service: Orthopedics;  Laterality: Right;   HERNIA REPAIR     INSERTION OF MESH N/A 05/15/2017   Procedure: INSERTION OF MESH;  Surgeon: Jovita Kussmaul, MD;  Location: [place];  Service: General;  Laterality: N/A;   LAPAROSCOPIC CHOLECYSTECTOMY  2003   MASTECTOMY Right 1992   RIGHT/LEFT HEART CATH AND CORONARY ANGIOGRAPHY N/A 07/05/2020   Procedure: RIGHT/LEFT HEART CATH AND CORONARY ANGIOGRAPHY;  Surgeon: Burnell Blanks, MD;  Location: [place] [facility];  Service: Cardiovascular;  Laterality: N/A;   TEE WITHOUT CARDIOVERSION N/A 08/14/2020   Procedure: TRANSESOPHAGEAL ECHOCARDIOGRAM (TEE);  Surgeon: Burnell Blanks, MD;  Location: [place];  Service: Open Heart Surgery;  Laterality: N/A;   TRANSURETHRAL RESECTION OF BLADDER TUMOR WITH MITOMYCIN-C N/A 01/12/2017   Procedure: TRANSURETHRAL RESECTION OF BLADDER TUMOR WITH POSSIBLE POST OPERATIVE INSTILLATION OF MITOMYCIN-C;  Surgeon: Raynelle Bring, MD;  Location: [facility];  Service: Urology;  Laterality: N/A;   TYMPANOSTOMY TUBE PLACEMENT Bilateral 2956O   UMBILICAL HERNIA REPAIR  2003   VENTRAL HERNIA REPAIR N/A 05/15/2017   Procedure: VENTRAL  HERNIA REPAIR WITH MESH;  Surgeon: Jovita Kussmaul, MD;  Location[facility];  Service: General;  Laterality: N/A;    SOCIAL HISTORY: Social History   Socioeconomic History   Marital status: Married    Spouse name: Not on file   Number of children: 3   Years of education: Not on file   Highest education level: Not on file  Occupational History   Occupation: Retired  Tobacco Use   Smoking status: Former    Packs/day: 1.00    Years: 46.00    Total pack years: 46.00    Types: Cigarettes    Quit date: 05/31/2011    Years since quitting: 10.8   Smokeless tobacco: Never  Vaping Use   Vaping Use: Never used  Substance and Sexual Activity   Alcohol use: No   Drug use: No   Sexual activity: Yes    Partners: Male    Birth control/protection: Post-menopausal  Other Topics Concern   Not on file  Social History Narrative   Lives with her husband.  Children all live in [place]  nearby. No pets. 6 grandchildren.   Social Determinants of Health   Financial Resource Strain: Low Risk  (04/25/2021)   Overall Financial Resource Strain (CARDIA)    Difficulty of Paying Living Expenses: Not very hard  Food Insecurity: Not on file  Transportation Needs: No Transportation Needs (04/25/2021)   PRAPARE - Hydrologist (Medical): No    Lack of Transportation (Non-Medical): No  Physical Activity: Not on file  Stress: Not on file  Social Connections: Not on file  Intimate Partner Violence: Not on file    FAMILY HISTORY: Family History  Problem Relation Age of Onset   Heart disease Mother        tachycardia   Heart disease Father    Diabetes Father    Hypertension Father    Asthma Sister    Allergies Sister    Hyperlipidemia Sister    Hashimoto's thyroiditis Sister    Fibromyalgia Sister    Skin cancer Sister        leg; dx after 36; surgery only   Asthma Sister    Allergies Sister    Hyperlipidemia Sister    Hashimoto's thyroiditis Sister    Fibromyalgia Sister     Breast cancer Sister 45       BRCA2 positive; metastatic in late 36s   Cancer Maternal Grandmother        deceased 44; unk. primary; possibly stomach   Tuberculosis Maternal Grandfather    Stroke Paternal Grandmother 1       died of cerebral hemorrhage   Cancer Other        unknown GYN cancers; MGM's sisters   Other Niece        BRCA2 positive; prophylatic mastectomy    ALLERGIES:  is allergic to contrast media [iodinated contrast media], iohexol, lisinopril, sulfa antibiotics, latex, codeine, levofloxacin, lipitor [atorvastatin calcium], meloxicam, nickel, and vicodin [hydrocodone-acetaminophen].  MEDICATIONS:  Current Outpatient Medications  Medication Sig Dispense Refill   acetaminophen (TYLENOL) 500 MG tablet Take 500-1,000 mg by mouth every 6 (six) hours as needed for moderate pain or headache.     albuterol (PROVENTIL) (2.5 MG/3ML) 0.083% nebulizer solution Take 3 mLs (2.5 mg total) by nebulization every 6 (six) hours as needed for wheezing or shortness of breath. 75 mL 12   albuterol (VENTOLIN HFA) 108 (90 Base) MCG/ACT inhaler Inhale 2 puffs into the lungs every 6 (six) hours as needed for wheezing or shortness of breath. 3 each 3   Alcohol Swabs (B-D SINGLE USE SWABS REGULAR) PADS Use twice a day when checking blood sugars 100 each 1   apixaban (ELIQUIS) 5 MG TABS tablet Take 1 tablet (5 mg total) by mouth 2 (two) times daily. 60 tablet 2   bisoprolol-hydrochlorothiazide (ZIAC) 10-6.25 MG tablet Take 1 tablet by mouth daily. 90 tablet 1   cetirizine (ZYRTEC) 10 MG tablet Take 10 mg by mouth daily.     clobetasol ointment (TEMOVATE) 8.87 % 1 application  once a week.     escitalopram (LEXAPRO) 10 MG tablet Take 1 tablet (10 mg total) by mouth daily. Start at 1/2 tablet once daily; increase after 1 week if/when tolerated 90 tablet 3   glucose blood test strip 1 each by Other route daily. Use as instructed 100 each 2   guaiFENesin (MUCINEX) 600 MG 12 hr tablet Take 600 mg by  mouth at bedtime.     Lancets MISC 1 each by Does not apply route daily. 100 each 2  metFORMIN (GLUCOPHAGE-XR) 500 MG 24 hr tablet TAKE 1 TABLET THREE TIMES DAILY 270 tablet 0   montelukast (SINGULAIR) 10 MG tablet Take 1 tablet (10 mg total) by mouth at bedtime. 90 tablet 3   Semaglutide,0.25 or 0.5MG/DOS, (OZEMPIC, 0.25 OR 0.5 MG/DOSE,) 2 MG/1.5ML SOPN Inject 0.5 mg into the skin once a week. 6 mL 0   simvastatin (ZOCOR) 20 MG tablet TAKE 1 TABLET AT BEDTIME 90 tablet 0   umeclidinium-vilanterol (ANORO ELLIPTA) 62.5-25 MCG/ACT AEPB Inhale 1 puff into the lungs daily. 60 each 0   valACYclovir (VALTREX) 500 MG tablet Take 1 tablet (500 mg total) by mouth daily. 90 tablet 3   ALPRAZolam (XANAX) 0.25 MG tablet Take 1 tablet (0.25 mg total) by mouth 2 (two) times daily as needed for anxiety. (Patient not taking: Reported on 03/05/2022) 20 tablet 0   furosemide (LASIX) 20 MG tablet Take 1 tablet (20 mg total) by mouth daily. (Patient not taking: Reported on 03/05/2022) 90 tablet 3   predniSONE (DELTASONE) 20 MG tablet Take 1 tablet (20 mg total) by mouth 2 (two) times daily with a meal. (Patient not taking: Reported on 03/19/2022) 10 tablet 0   triamcinolone cream (KENALOG) 0.1 % 1 application  daily as needed (rash or redness). (Patient not taking: Reported on 03/17/2022)     No current facility-administered medications for this visit.    REVIEW OF SYSTEMS:   Constitutional: ( - ) fevers, ( - )  chills , ( - ) night sweats Eyes: ( - ) blurriness of vision, ( - ) double vision, ( - ) watery eyes Ears, nose, mouth, throat, and face: ( - ) mucositis, ( - ) sore throat Respiratory: ( - ) cough, ( - ) dyspnea, ( - ) wheezes Cardiovascular: ( - ) palpitation, ( - ) chest discomfort, (+ ) lower extremity swelling Gastrointestinal:  ( - ) nausea, ( - ) heartburn, ( - ) change in bowel habits Skin: ( - ) abnormal skin rashes Lymphatics: ( - ) new lymphadenopathy, ( - ) easy bruising Neurological: ( - )  numbness, ( - ) tingling, ( - ) new weaknesses Behavioral/Psych: ( - ) mood change, ( - ) new changes  All other systems were reviewed with the patient and are negative.  PHYSICAL EXAMINATION: ECOG PERFORMANCE STATUS: 0 - Asymptomatic  Vitals:   03/19/22 1035  BP: (!) 139/59  Pulse: 82  Temp: 98.4 F (36.9 C)  SpO2: 91%   Filed Weights   03/19/22 1035  Weight: 231 lb 3.2 oz (104.9 kg)    GENERAL: well appearing female in NAD  SKIN: skin color, texture, turgor are normal, no rashes or significant lesions EYES: conjunctiva are pink and non-injected, sclera clear LUNGS: clear to auscultation and percussion with normal breathing effort HEART: regular rate & rhythm and no murmurs. Bilateral venous stasis erythema with edema.  Musculoskeletal: no cyanosis of digits and no clubbing  PSYCH: alert & oriented x 3, fluent speech NEURO: no focal motor/sensory deficits  LABORATORY DATA:  I have reviewed the data as listed    Latest Ref Rng & Units 03/19/2022   10:06 AM 10/16/2021   12:23 PM 09/23/2021    1:17 PM  CBC  WBC 4.0 - 10.5 K/uL 3.1  6.4  7.8   Hemoglobin 12.0 - 15.0 g/dL 12.9  13.4  13.4   Hematocrit 36.0 - 46.0 % 39.7  40.8  39.1   Platelets 150 - 400 K/uL 101  113  148  Latest Ref Rng & Units 03/19/2022   10:06 AM 12/09/2021    8:55 AM 10/16/2021   12:23 PM  CMP  Glucose 70 - 99 mg/dL 124  128  156   BUN 8 - 23 mg/dL '11  15  23   ' Creatinine 0.44 - 1.00 mg/dL 0.66  0.69  0.75   Sodium 135 - 145 mmol/L 139  140  142   Potassium 3.5 - 5.1 mmol/L 3.7  4.6  3.8   Chloride 98 - 111 mmol/L 104  102  105   CO2 22 - 32 mmol/L 31  24  32   Calcium 8.9 - 10.3 mg/dL 9.0  9.3  9.5   Total Protein 6.5 - 8.1 g/dL 6.3  6.2  6.6   Total Bilirubin 0.3 - 1.2 mg/dL 0.7  1.2  0.7   Alkaline Phos 38 - 126 U/L 55  65  54   AST 15 - 41 U/L 24  27  33   ALT 0 - 44 U/L '13  21  26     ' RADIOGRAPHIC STUDIES: I have personally reviewed the radiological images as listed and agreed  with the findings in the report. VAS [place] LOWER EXTREMITY VENOUS REFLUX  Result Date: 02/26/2022  Lower Venous Reflux Study Patient Name:  Shannon Schultz  Date of Exam:   02/26/2022 Medical Rec #: [id number]      Accession #:    [id number] Date of Birth: 25-Sep-1945     Patient Gender: F Patient Age:   43 years Exam Location:  [facility] Procedure:      VAS [place] LOWER EXTREMITY VENOUS REFLUX Referring Phys: Servando Snare --------------------------------------------------------------------------------  Indications: Varicosities of the left leg.  Performing Technologist: Ronal Fear RVS, RCS  Examination Guidelines: A complete evaluation includes B-mode imaging, spectral Doppler, color Doppler, and power Doppler as needed of all accessible portions of each vessel. Bilateral testing is considered an integral part of a complete examination. Limited examinations for reoccurring indications may be performed as noted. The reflux portion of the exam is performed with the patient in reverse Trendelenburg. Significant venous reflux is defined as >500 ms in the superficial venous system, and >1 second in the deep venous system.  +------------------+---------+------+-----------+------------+-----------------+ LEFT              Reflux NoRefluxReflux TimeDiameter cmsComments                                      Yes                                           +------------------+---------+------+-----------+------------+-----------------+ CFV                         yes   >1 second                               +------------------+---------+------+-----------+------------+-----------------+ FV mid            no                                                      +------------------+---------+------+-----------+------------+-----------------+  Popliteal         no                                                       +------------------+---------+------+-----------+------------+-----------------+ GSV at [facility]                  yes    >500 ms      0.78                      +------------------+---------+------+-----------+------------+-----------------+ GSV prox thigh    no                            0.41                      +------------------+---------+------+-----------+------------+-----------------+ GSV mid thigh               yes    >500 ms      0.44                      +------------------+---------+------+-----------+------------+-----------------+ GSV dist thigh    no                            0.52    large branch                                                              feeding the                                                               posterior thigh                                                           varicosities      +------------------+---------+------+-----------+------------+-----------------+ GSV at knee       no                            0.50                      +------------------+---------+------+-----------+------------+-----------------+ SSV Pop Fossa     no                            0.20                      +------------------+---------+------+-----------+------------+-----------------+ anterior accessoryno  0.48                      +------------------+---------+------+-----------+------------+-----------------+   Summary: Left: - No evidence of deep vein thrombosis from the common femoral through the popliteal veins. - No evidence of superficial venous thrombosis. - The common femoral vein is not competent. - The great saphenous vein is not competent. - The small saphenous vein is competent. Of note: reflux at the [facility] and mid thigh GSV could only be demonstrated with manuel compression of the abdomen; valsalva and distal augmentation did not produce reflux.  *See table(s) above for  measurements and observations. Electronically signed by Servando Snare MD on 02/26/2022 at 4:16:39 PM.    Final     ASSESSMENT & PLAN Shannon Schultz is a 76 y.o. female who presents for a follow up for history of bilateral pulmonary emboli diagnosed in March 2023.  #Unprovoked Bilateral Pulmonary Emboli --findings at this time are consistent with a unprovoked VTE --No evidence of antiphospholipid syndrome based on testing from 10/16/2021 --Currently on eliquis 33m BID for the last 6 months. Okay to transition to maintenance dose of 2.5 mg twice daily. I will send recommendations to Dr. ELoanne Drillingwho manages her prescription.  --patient denies any bleeding, bruising, or dark stools on this medication. It is well tolerated. No difficulties accessing/affording the medication --RTC in 6 months strict return precautions for overt signs of bleeding.   #Personal history of breast cancer and bladder cancer: --Patient underwent genetic testing in August 2015 for personal history and family history of breast cancer.   #Leukopenia/Thrombocytopenia:  --Likely secondary to hepatic cirrhosis and splenomegaly seen on CT imaging from 10/14/2021 --Monitor for now.   No orders of the defined types were placed in this encounter.   All questions were answered. The patient knows to call the clinic with any problems, questions or concerns.  I have spent a total of 30 minutes minutes of face-to-face and non-face-to-face time, preparing to see the patient,  performing a medically appropriate examination, counseling and educating the patient, documenting clinical information in the electronic health record, and care coordination.   IDede Query PA-C Department of Hematology/Oncology [facility]: [phone]

## 2022-03-20 ENCOUNTER — Encounter: Payer: Self-pay | Admitting: Family Medicine

## 2022-03-22 DIAGNOSIS — I454 Nonspecific intraventricular block: Secondary | ICD-10-CM | POA: Diagnosis not present

## 2022-03-22 DIAGNOSIS — R0902 Hypoxemia: Secondary | ICD-10-CM | POA: Diagnosis not present

## 2022-03-22 DIAGNOSIS — I10 Essential (primary) hypertension: Secondary | ICD-10-CM | POA: Diagnosis not present

## 2022-03-22 DIAGNOSIS — I491 Atrial premature depolarization: Secondary | ICD-10-CM | POA: Diagnosis not present

## 2022-03-23 ENCOUNTER — Encounter (HOSPITAL_COMMUNITY): Payer: Self-pay

## 2022-03-23 ENCOUNTER — Emergency Department (HOSPITAL_COMMUNITY): Payer: Medicare HMO

## 2022-03-23 ENCOUNTER — Other Ambulatory Visit: Payer: Self-pay

## 2022-03-23 ENCOUNTER — Inpatient Hospital Stay (HOSPITAL_COMMUNITY)
Admission: EM | Admit: 2022-03-23 | Discharge: 2022-03-25 | DRG: 190 | Disposition: A | Discharge status: Home / Self Care | Payer: Medicare HMO | Attending: Internal Medicine | Admitting: Internal Medicine

## 2022-03-23 DIAGNOSIS — Z8616 Personal history of COVID-19: Secondary | ICD-10-CM

## 2022-03-23 DIAGNOSIS — E7800 Pure hypercholesterolemia, unspecified: Secondary | ICD-10-CM | POA: Diagnosis present

## 2022-03-23 DIAGNOSIS — K7460 Unspecified cirrhosis of liver: Secondary | ICD-10-CM | POA: Diagnosis present

## 2022-03-23 DIAGNOSIS — Z803 Family history of malignant neoplasm of breast: Secondary | ICD-10-CM

## 2022-03-23 DIAGNOSIS — D6959 Other secondary thrombocytopenia: Secondary | ICD-10-CM | POA: Diagnosis not present

## 2022-03-23 DIAGNOSIS — I2510 Atherosclerotic heart disease of native coronary artery without angina pectoris: Secondary | ICD-10-CM | POA: Diagnosis present

## 2022-03-23 DIAGNOSIS — Z860100 Personal history of colon polyps, unspecified: Secondary | ICD-10-CM | POA: Insufficient documentation

## 2022-03-23 DIAGNOSIS — M8580 Other specified disorders of bone density and structure, unspecified site: Secondary | ICD-10-CM | POA: Diagnosis present

## 2022-03-23 DIAGNOSIS — D696 Thrombocytopenia, unspecified: Secondary | ICD-10-CM | POA: Diagnosis not present

## 2022-03-23 DIAGNOSIS — Z87891 Personal history of nicotine dependence: Secondary | ICD-10-CM | POA: Diagnosis not present

## 2022-03-23 DIAGNOSIS — E1169 Type 2 diabetes mellitus with other specified complication: Secondary | ICD-10-CM | POA: Diagnosis present

## 2022-03-23 DIAGNOSIS — Z823 Family history of stroke: Secondary | ICD-10-CM

## 2022-03-23 DIAGNOSIS — I4891 Unspecified atrial fibrillation: Secondary | ICD-10-CM | POA: Diagnosis present

## 2022-03-23 DIAGNOSIS — I700 Atherosclerosis of aorta: Secondary | ICD-10-CM | POA: Diagnosis present

## 2022-03-23 DIAGNOSIS — J441 Chronic obstructive pulmonary disease with (acute) exacerbation: Secondary | ICD-10-CM | POA: Diagnosis not present

## 2022-03-23 DIAGNOSIS — Z86711 Personal history of pulmonary embolism: Secondary | ICD-10-CM

## 2022-03-23 DIAGNOSIS — I2699 Other pulmonary embolism without acute cor pulmonale: Secondary | ICD-10-CM | POA: Diagnosis present

## 2022-03-23 DIAGNOSIS — Z91040 Latex allergy status: Secondary | ICD-10-CM

## 2022-03-23 DIAGNOSIS — Z882 Allergy status to sulfonamides status: Secondary | ICD-10-CM

## 2022-03-23 DIAGNOSIS — Z808 Family history of malignant neoplasm of other organs or systems: Secondary | ICD-10-CM

## 2022-03-23 DIAGNOSIS — Z7984 Long term (current) use of oral hypoglycemic drugs: Secondary | ICD-10-CM

## 2022-03-23 DIAGNOSIS — E119 Type 2 diabetes mellitus without complications: Secondary | ICD-10-CM | POA: Diagnosis present

## 2022-03-23 DIAGNOSIS — Z825 Family history of asthma and other chronic lower respiratory diseases: Secondary | ICD-10-CM

## 2022-03-23 DIAGNOSIS — Z9981 Dependence on supplemental oxygen: Secondary | ICD-10-CM | POA: Diagnosis not present

## 2022-03-23 DIAGNOSIS — K7581 Nonalcoholic steatohepatitis (NASH): Secondary | ICD-10-CM | POA: Diagnosis present

## 2022-03-23 DIAGNOSIS — I5189 Other ill-defined heart diseases: Secondary | ICD-10-CM

## 2022-03-23 DIAGNOSIS — Z8551 Personal history of malignant neoplasm of bladder: Secondary | ICD-10-CM

## 2022-03-23 DIAGNOSIS — U071 COVID-19: Secondary | ICD-10-CM | POA: Diagnosis not present

## 2022-03-23 DIAGNOSIS — Z881 Allergy status to other antibiotic agents status: Secondary | ICD-10-CM

## 2022-03-23 DIAGNOSIS — Z7985 Long-term (current) use of injectable non-insulin antidiabetic drugs: Secondary | ICD-10-CM

## 2022-03-23 DIAGNOSIS — Z79899 Other long term (current) drug therapy: Secondary | ICD-10-CM

## 2022-03-23 DIAGNOSIS — J439 Emphysema, unspecified: Principal | ICD-10-CM | POA: Diagnosis present

## 2022-03-23 DIAGNOSIS — Z8249 Family history of ischemic heart disease and other diseases of the circulatory system: Secondary | ICD-10-CM | POA: Diagnosis not present

## 2022-03-23 DIAGNOSIS — R059 Cough, unspecified: Secondary | ICD-10-CM | POA: Diagnosis not present

## 2022-03-23 DIAGNOSIS — Z8679 Personal history of other diseases of the circulatory system: Secondary | ICD-10-CM

## 2022-03-23 DIAGNOSIS — I472 Ventricular tachycardia, unspecified: Secondary | ICD-10-CM | POA: Diagnosis not present

## 2022-03-23 DIAGNOSIS — T17920A Food in respiratory tract, part unspecified causing asphyxiation, initial encounter: Secondary | ICD-10-CM | POA: Diagnosis not present

## 2022-03-23 DIAGNOSIS — Z6841 Body Mass Index (BMI) 40.0 and over, adult: Secondary | ICD-10-CM | POA: Diagnosis not present

## 2022-03-23 DIAGNOSIS — I10 Essential (primary) hypertension: Secondary | ICD-10-CM | POA: Diagnosis present

## 2022-03-23 DIAGNOSIS — I714 Abdominal aortic aneurysm, without rupture, unspecified: Secondary | ICD-10-CM | POA: Diagnosis present

## 2022-03-23 DIAGNOSIS — Z23 Encounter for immunization: Secondary | ICD-10-CM | POA: Diagnosis not present

## 2022-03-23 DIAGNOSIS — E039 Hypothyroidism, unspecified: Secondary | ICD-10-CM | POA: Diagnosis present

## 2022-03-23 DIAGNOSIS — Z952 Presence of prosthetic heart valve: Secondary | ICD-10-CM | POA: Diagnosis not present

## 2022-03-23 DIAGNOSIS — Z833 Family history of diabetes mellitus: Secondary | ICD-10-CM

## 2022-03-23 DIAGNOSIS — R042 Hemoptysis: Secondary | ICD-10-CM | POA: Diagnosis not present

## 2022-03-23 DIAGNOSIS — G4734 Idiopathic sleep related nonobstructive alveolar hypoventilation: Secondary | ICD-10-CM | POA: Diagnosis present

## 2022-03-23 DIAGNOSIS — Z853 Personal history of malignant neoplasm of breast: Secondary | ICD-10-CM

## 2022-03-23 DIAGNOSIS — K5730 Diverticulosis of large intestine without perforation or abscess without bleeding: Secondary | ICD-10-CM | POA: Insufficient documentation

## 2022-03-23 DIAGNOSIS — Z7901 Long term (current) use of anticoagulants: Secondary | ICD-10-CM

## 2022-03-23 DIAGNOSIS — Z20822 Contact with and (suspected) exposure to covid-19: Secondary | ICD-10-CM | POA: Diagnosis not present

## 2022-03-23 DIAGNOSIS — J9621 Acute and chronic respiratory failure with hypoxia: Secondary | ICD-10-CM | POA: Diagnosis not present

## 2022-03-23 DIAGNOSIS — G4733 Obstructive sleep apnea (adult) (pediatric): Secondary | ICD-10-CM | POA: Diagnosis present

## 2022-03-23 DIAGNOSIS — Z8349 Family history of other endocrine, nutritional and metabolic diseases: Secondary | ICD-10-CM

## 2022-03-23 DIAGNOSIS — R0602 Shortness of breath: Secondary | ICD-10-CM | POA: Diagnosis not present

## 2022-03-23 DIAGNOSIS — Z885 Allergy status to narcotic agent status: Secondary | ICD-10-CM

## 2022-03-23 DIAGNOSIS — Z91041 Radiographic dye allergy status: Secondary | ICD-10-CM

## 2022-03-23 DIAGNOSIS — E669 Obesity, unspecified: Secondary | ICD-10-CM | POA: Diagnosis present

## 2022-03-23 DIAGNOSIS — Z888 Allergy status to other drugs, medicaments and biological substances status: Secondary | ICD-10-CM

## 2022-03-23 HISTORY — DX: Nonalcoholic steatohepatitis (NASH): K75.81

## 2022-03-23 LAB — CBC WITH DIFFERENTIAL/PLATELET
Abs Immature Granulocytes: 0.03 10*3/uL (ref 0.00–0.07)
Basophils Absolute: 0 10*3/uL (ref 0.0–0.1)
Basophils Relative: 0 %
Eosinophils Absolute: 0 10*3/uL (ref 0.0–0.5)
Eosinophils Relative: 0 %
HCT: 43.2 % (ref 36.0–46.0)
Hemoglobin: 14 g/dL (ref 12.0–15.0)
Immature Granulocytes: 1 %
Lymphocytes Relative: 9 %
Lymphs Abs: 0.5 10*3/uL — ABNORMAL LOW (ref 0.7–4.0)
MCH: 29.7 pg (ref 26.0–34.0)
MCHC: 32.4 g/dL (ref 30.0–36.0)
MCV: 91.5 fL (ref 80.0–100.0)
Monocytes Absolute: 0.2 10*3/uL (ref 0.1–1.0)
Monocytes Relative: 3 %
Neutro Abs: 5.3 10*3/uL (ref 1.7–7.7)
Neutrophils Relative %: 87 %
Platelets: 133 10*3/uL — ABNORMAL LOW (ref 150–400)
RBC: 4.72 MIL/uL (ref 3.87–5.11)
RDW: 14.4 % (ref 11.5–15.5)
WBC: 6.1 10*3/uL (ref 4.0–10.5)
nRBC: 0 % (ref 0.0–0.2)

## 2022-03-23 LAB — BASIC METABOLIC PANEL
Anion gap: 9 (ref 5–15)
BUN: 18 mg/dL (ref 8–23)
CO2: 24 mmol/L (ref 22–32)
Calcium: 9.1 mg/dL (ref 8.9–10.3)
Chloride: 106 mmol/L (ref 98–111)
Creatinine, Ser: 0.63 mg/dL (ref 0.44–1.00)
GFR, Estimated: >60 mL/min (ref >60)
Glucose, Bld: 198 mg/dL — ABNORMAL HIGH (ref 70–99)
Potassium: 4 mmol/L (ref 3.5–5.1)
Sodium: 139 mmol/L (ref 135–145)

## 2022-03-23 LAB — GLUCOSE, CAPILLARY
Glucose-Capillary: 278 mg/dL — ABNORMAL HIGH (ref 70–99)
Glucose-Capillary: 242 mg/dL — ABNORMAL HIGH (ref 70–99)

## 2022-03-23 LAB — PHOSPHORUS: Phosphorus: 2.8 mg/dL (ref 2.5–4.6)

## 2022-03-23 LAB — RESP PANEL BY RT-PCR (FLU A&B, COVID) ARPGX2
Influenza A by PCR: NEGATIVE
Influenza B by PCR: NEGATIVE
SARS Coronavirus 2 by RT PCR: NEGATIVE

## 2022-03-23 LAB — MAGNESIUM: Magnesium: 1.9 mg/dL (ref 1.7–2.4)

## 2022-03-23 LAB — CBG MONITORING, ED: Glucose-Capillary: 196 mg/dL — ABNORMAL HIGH (ref 70–99)

## 2022-03-23 MED ORDER — MONTELUKAST SODIUM 10 MG PO TABS
10.0000 mg | ORAL_TABLET | Freq: Every day | ORAL | Status: DC
  Administered 2022-03-23 – 2022-03-24 (×2): 10 mg via ORAL
  Filled 2022-03-23 (×2): qty 1

## 2022-03-23 MED ORDER — ONDANSETRON HCL 4 MG/2ML IJ SOLN: 4.0000 mg | Freq: Four times a day (QID) | INTRAMUSCULAR | Status: DC | PRN

## 2022-03-23 MED ORDER — GUAIFENESIN ER 600 MG PO TB12
600.0000 mg | ORAL_TABLET | Freq: Two times a day (BID) | ORAL | Status: DC
  Administered 2022-03-23 – 2022-03-25 (×5): 600 mg via ORAL
  Filled 2022-03-23 (×5): qty 1

## 2022-03-23 MED ORDER — BISOPROLOL FUMARATE 5 MG PO TABS
10.0000 mg | ORAL_TABLET | Freq: Every day | ORAL | Status: DC
  Administered 2022-03-23 – 2022-03-25 (×3): 10 mg via ORAL
  Filled 2022-03-23 (×3): qty 2

## 2022-03-23 MED ORDER — ACETAMINOPHEN 650 MG RE SUPP: 650.0000 mg | Freq: Four times a day (QID) | RECTAL | Status: DC | PRN

## 2022-03-23 MED ORDER — DOXYCYCLINE HYCLATE 100 MG PO TABS
100.0000 mg | ORAL_TABLET | Freq: Two times a day (BID) | ORAL | Status: DC
  Administered 2022-03-23 – 2022-03-25 (×4): 100 mg via ORAL
  Filled 2022-03-23 (×4): qty 1

## 2022-03-23 MED ORDER — IOHEXOL 350 MG/ML SOLN
100.0000 mL | Freq: Once | INTRAVENOUS | Status: AC | PRN
  Administered 2022-03-23: 100 mL via INTRAVENOUS

## 2022-03-23 MED ORDER — METFORMIN HCL ER 500 MG PO TB24: 500.0000 mg | ORAL_TABLET | Freq: Every day | ORAL | Status: DC

## 2022-03-23 MED ORDER — VALACYCLOVIR HCL 500 MG PO TABS
500.0000 mg | ORAL_TABLET | Freq: Every day | ORAL | Status: DC
  Administered 2022-03-23 – 2022-03-25 (×3): 500 mg via ORAL
  Filled 2022-03-23 (×3): qty 1

## 2022-03-23 MED ORDER — APIXABAN 5 MG PO TABS
5.0000 mg | ORAL_TABLET | Freq: Two times a day (BID) | ORAL | Status: DC
  Administered 2022-03-23 – 2022-03-25 (×4): 5 mg via ORAL
  Filled 2022-03-23 (×4): qty 1

## 2022-03-23 MED ORDER — DIPHENHYDRAMINE HCL 50 MG/ML IJ SOLN
50.0000 mg | Freq: Once | INTRAMUSCULAR | Status: AC
Start: 2022-03-23 — End: 2022-03-23
  Administered 2022-03-23: 50 mg via INTRAVENOUS
  Filled 2022-03-23: qty 1

## 2022-03-23 MED ORDER — ALBUTEROL SULFATE (2.5 MG/3ML) 0.083% IN NEBU: 2.5000 mg | INHALATION_SOLUTION | Freq: Four times a day (QID) | RESPIRATORY_TRACT | Status: DC | PRN

## 2022-03-23 MED ORDER — PROCHLORPERAZINE EDISYLATE 10 MG/2ML IJ SOLN: 5.0000 mg | INTRAMUSCULAR | Status: DC | PRN

## 2022-03-23 MED ORDER — HYDROCHLOROTHIAZIDE 12.5 MG PO TABS
6.2500 mg | ORAL_TABLET | Freq: Every day | ORAL | Status: DC
  Administered 2022-03-23 – 2022-03-25 (×3): 6.25 mg via ORAL
  Filled 2022-03-23 (×3): qty 1

## 2022-03-23 MED ORDER — APIXABAN 5 MG PO TABS
5.0000 mg | ORAL_TABLET | Freq: Once | ORAL | Status: AC
  Administered 2022-03-23: 5 mg via ORAL
  Filled 2022-03-23: qty 1

## 2022-03-23 MED ORDER — BISOPROLOL-HYDROCHLOROTHIAZIDE 10-6.25 MG PO TABS: 1.0000 | ORAL_TABLET | Freq: Every day | ORAL | Status: DC

## 2022-03-23 MED ORDER — DIPHENHYDRAMINE HCL 25 MG PO CAPS: 50.0000 mg | ORAL_CAPSULE | Freq: Once | ORAL | Status: AC

## 2022-03-23 MED ORDER — SIMVASTATIN 20 MG PO TABS
20.0000 mg | ORAL_TABLET | Freq: Every day | ORAL | Status: DC
  Administered 2022-03-23 – 2022-03-24 (×2): 20 mg via ORAL
  Filled 2022-03-23 (×2): qty 1

## 2022-03-23 MED ORDER — ACETAMINOPHEN 325 MG PO TABS
650.0000 mg | ORAL_TABLET | Freq: Four times a day (QID) | ORAL | Status: DC | PRN
  Administered 2022-03-24: 650 mg via ORAL
  Filled 2022-03-23: qty 2

## 2022-03-23 MED ORDER — IPRATROPIUM-ALBUTEROL 0.5-2.5 (3) MG/3ML IN SOLN
6.0000 mL | Freq: Once | RESPIRATORY_TRACT | Status: AC
  Administered 2022-03-23: 6 mL via RESPIRATORY_TRACT
  Filled 2022-03-23: qty 6

## 2022-03-23 MED ORDER — INSULIN ASPART 100 UNIT/ML IJ SOLN
0.0000 [IU] | Freq: Three times a day (TID) | INTRAMUSCULAR | Status: DC
  Administered 2022-03-23: 3 [IU] via SUBCUTANEOUS
  Administered 2022-03-23 – 2022-03-24 (×2): 8 [IU] via SUBCUTANEOUS
  Administered 2022-03-24: 5 [IU] via SUBCUTANEOUS
  Administered 2022-03-25: 3 [IU] via SUBCUTANEOUS
  Filled 2022-03-23: qty 0.15

## 2022-03-23 MED ORDER — ONDANSETRON HCL 4 MG PO TABS: 4.0000 mg | ORAL_TABLET | Freq: Four times a day (QID) | ORAL | Status: DC | PRN

## 2022-03-23 MED ORDER — METHYLPREDNISOLONE SODIUM SUCC 40 MG IJ SOLR
40.0000 mg | Freq: Once | INTRAMUSCULAR | Status: AC
  Administered 2022-03-23: 40 mg via INTRAVENOUS
  Filled 2022-03-23: qty 1

## 2022-03-23 MED ORDER — ESCITALOPRAM OXALATE 10 MG PO TABS
10.0000 mg | ORAL_TABLET | Freq: Every day | ORAL | Status: DC
  Administered 2022-03-23 – 2022-03-25 (×3): 10 mg via ORAL
  Filled 2022-03-23 (×3): qty 1

## 2022-03-23 MED ORDER — DOXYCYCLINE HYCLATE 100 MG PO TABS
100.0000 mg | ORAL_TABLET | Freq: Once | ORAL | Status: AC
  Administered 2022-03-23: 100 mg via ORAL
  Filled 2022-03-23: qty 1

## 2022-03-23 MED ORDER — PREDNISONE 20 MG PO TABS
40.0000 mg | ORAL_TABLET | Freq: Every day | ORAL | Status: DC
  Administered 2022-03-24 – 2022-03-25 (×2): 40 mg via ORAL
  Filled 2022-03-23 (×2): qty 2

## 2022-03-23 MED ORDER — IPRATROPIUM-ALBUTEROL 0.5-2.5 (3) MG/3ML IN SOLN
3.0000 mL | Freq: Four times a day (QID) | RESPIRATORY_TRACT | Status: DC
  Administered 2022-03-23 – 2022-03-25 (×10): 3 mL via RESPIRATORY_TRACT
  Filled 2022-03-23 (×10): qty 3

## 2022-03-23 MED ORDER — METHYLPREDNISOLONE SODIUM SUCC 125 MG IJ SOLR
85.0000 mg | Freq: Once | INTRAMUSCULAR | Status: AC
  Administered 2022-03-23: 85 mg via INTRAVENOUS
  Filled 2022-03-23: qty 2

## 2022-03-23 MED ORDER — LORATADINE 10 MG PO TABS
10.0000 mg | ORAL_TABLET | Freq: Every day | ORAL | Status: DC
  Administered 2022-03-23 – 2022-03-25 (×3): 10 mg via ORAL
  Filled 2022-03-23 (×3): qty 1

## 2022-03-23 MED ORDER — IPRATROPIUM-ALBUTEROL 0.5-2.5 (3) MG/3ML IN SOLN
3.0000 mL | Freq: Once | RESPIRATORY_TRACT | Status: AC
  Administered 2022-03-23: 3 mL via RESPIRATORY_TRACT
  Filled 2022-03-23: qty 3

## 2022-03-23 NOTE — ED Notes (Signed)
ED Provider at bedside. 

## 2022-03-23 NOTE — H&P (Signed)
History and Physical    Patient: Shannon Schultz:[id number] DOB: March 14, 1946 DOA: 03/23/2022 DOS: the patient was seen and examined on 03/23/2022 PCP: [name], MD  Patient coming from: Home  Chief Complaint:  Chief Complaint  Patient presents with   Shortness of Breath   HPI: Shannon Schultz is a 76 y.o. female with medical history significant of osteoarthritis of the hands, bladder cancer, right breast cancer, colon polyps, COPD, CAD, COVID-19 virus infection, diverticulosis, liver cirrhosis due to fatty liver disease, hyperlipidemia, herpes simplex infection of the hip on daily suppression with valacyclovir, hypertension, hypothyroidism, type 2 diabetes, migraine headaches, osteopenia, severe aortic stenosis with history of TAVR, sleep apnea not on CPAP, ventral hernia with ventral hernia repair, vitamin D deficiency who has been suffering URI symptoms associated with increased dyspnea, fatigue, mild sore throat, wheezing and productive cough since about a week ago after her husband started with similar symptoms 1 or 2 days prior.  She saw her doctor earlier in the week.  She has been using nebulizer treatments twice a day.  Her doctor gave her a prescription of prednisone to fill out but she did not get better.  She is started using it on Thursday without significant relief yet.  Yesterday, while she was eating cornbread she started coughing and then fell that a piece of corn bread went down her airways causing her to cough and become very dyspneic.  She had to use her O2 oxygen at a much higher flow, did not improve and called the ambulance. She was placed on oxygen and felt better, then decided not to come to the emergency department.  However, she became dyspneic again and opted to come to the emergency department where she was found to be hypoxic and was transiently placed on higher flow of oxygen.  She stated she have 4 different episodes of hemoptysis (blood-tinged mucus x2 and 2 small clots)  while coughing.  No fever, chills or night sweats.  She has had mild rhinorrhea and sore throat.  No chest pain, palpitations, diaphoresis, PND, orthopnea or pitting edema of the lower extremities.  No abdominal pain, diarrhea, constipation, melena or hematochezia.  No flank pain, dysuria, frequency or hematuria.  No polyuria, polydipsia, polyphagia or blurred vision.  ED course: Initial vital signs were temperature 98.2 F, pulse 75, respirations 28, BP 195/89 mmHg and O2 sat 82% on 2 L via nasal cannula.  The patient received doxycycline 100 mg p.o. x1, methylprednisolone 125 mg IVP and DuoNeb and was premedicated with Benadryl 50 mg due to IV contrast allergy.  Lab work: Coronavirus and influenza PCR was negative.  CBC with a white count 6.1 with 87% neutrophils, hemoglobin 14.0 g/dL and platelets 133.  BMP with a glucose of 198 mg/deciliter, but otherwise normal.  Imaging: CTA chest with no pulmonary emboli.  There is calcified atherosclerotic change in the thoracic aorta, upper abdominal aorta and left coronary arteries.  There was aortic atherosclerosis, emphysema and cirrhosis.  Please see images and full radiology report for further details.   Review of Systems: As mentioned in the history of present illness. All other systems reviewed and are negative.  Past Medical History:  Diagnosis Date   Arthritis    "hands" (05/15/2017)   Bladder cancer ([place]) 2018   Breast cancer, right ([place]) 1992   DCIS,bladder ca (just dx)   Colon polyp    Complication of anesthesia 1992   "local anesthesia" used was hard to awaken from-no problems since (05/15/2017)  COPD (chronic obstructive pulmonary disease) (HCC)    Coronary artery disease    COVID-19 virus infection 05/05/2019   Diverticulosis    Elevated liver enzymes    fatty liver per ultrasound per pt   FHx: BRCA2 gene positive    sister with BRCA2 mutation (pt tested NEGATIVE)   HLD (hyperlipidemia)    HSV (herpes simplex virus) infection     on hip--on daily suppression   Hypertension    Hypothyroidism    took med 7 yrs after birth of 1st child   Impaired glucose tolerance    Migraine    On home oxygen therapy    "have it available but I'm not using it" (05/15/2017)   Osteopenia    Personal history of breast cancer 11/15/2021   S/P TAVR (transcatheter aortic valve replacement) 08/14/2020   s/p TAVR with a 26 mm Edwards S3U via the left subclavian approach by Dr. Roxy Manns and Dr. Angelena Form   Severe aortic stenosis    Sleep apnea    Sleeps with 2 L O2 @ night   Type 2 diabetes mellitus ([place])    Ventral hernia    Vitamin D deficiency disease    Past Surgical History:  Procedure Laterality Date   ABDOMINAL HERNIA REPAIR  05/15/2017   BREAST BIOPSY Right 1992   BREAST BIOPSY Left 2018   BREAST EXCISIONAL BIOPSY Left 2018   ATYPICAL DUCTAL HYPERPLASIA INVOLVING A COMPLEX   BREAST LUMPECTOMY WITH RADIOACTIVE SEED LOCALIZATION Left 12/22/2016   Procedure: LEFT BREAST LUMPECTOMY WITH RADIOACTIVE SEED LOCALIZATION;  Surgeon: Jovita Kussmaul, MD;  Location: [place];  Service: General;  Laterality: Left;   CARDIAC CATHETERIZATION     CATARACT EXTRACTION, BILATERAL Bilateral R 03/16/2019 L 03/30/2019   Dr. Kathlen Mody   COLONOSCOPY  2009, 08/2010, 12/2015   Dr. Collene Mares; "only 1 had any polyps" (05/15/2017)   CYSTOSCOPY  04/23/2017   CYSTOSCOPY W/ RETROGRADES Bilateral 01/12/2017   Procedure: CYSTOSCOPY WITH RETROGRADE PYELOGRAM/ EXAM UNDER ANESTHESIA;  Surgeon: Raynelle Bring, MD;  Location: [facility];  Service: Urology;  Laterality: Bilateral;   ENDOVENOUS ABLATION SAPHENOUS VEIN W/ LASER Right 01/09/2022   endovenous laser ablation right greater saphenous vein by Servando Snare MD   EYE SURGERY     FEMUR IM NAIL Right 08/24/2019   Procedure: INTRAMEDULLARY (IM) NAIL FEMORAL;  Surgeon: Rod Can, MD;  Location: [facility];  Service: Orthopedics;  Laterality: Right;   HERNIA REPAIR     INSERTION OF MESH N/A 05/15/2017   Procedure: INSERTION OF  MESH;  Surgeon: Jovita Kussmaul, MD;  Location: [place];  Service: General;  Laterality: N/A;   LAPAROSCOPIC CHOLECYSTECTOMY  2003   MASTECTOMY Right 1992   RIGHT/LEFT HEART CATH AND CORONARY ANGIOGRAPHY N/A 07/05/2020   Procedure: RIGHT/LEFT HEART CATH AND CORONARY ANGIOGRAPHY;  Surgeon: Burnell Blanks, MD;  Location: [facility];  Service: Cardiovascular;  Laterality: N/A;   TEE WITHOUT CARDIOVERSION N/A 08/14/2020   Procedure: TRANSESOPHAGEAL ECHOCARDIOGRAM (TEE);  Surgeon: Burnell Blanks, MD;  Location: [place];  Service: Open Heart Surgery;  Laterality: N/A;   TRANSURETHRAL RESECTION OF BLADDER TUMOR WITH MITOMYCIN-C N/A 01/12/2017   Procedure: TRANSURETHRAL RESECTION OF BLADDER TUMOR WITH POSSIBLE POST OPERATIVE INSTILLATION OF MITOMYCIN-C;  Surgeon: Raynelle Bring, MD;  Location: [facility];  Service: Urology;  Laterality: N/A;   TYMPANOSTOMY TUBE PLACEMENT Bilateral 2542H   UMBILICAL HERNIA REPAIR  2003   VENTRAL HERNIA REPAIR N/A 05/15/2017   Procedure: VENTRAL HERNIA REPAIR WITH MESH;  Surgeon:  Jovita Kussmaul, MD;  Location: [place];  Service: General;  Laterality: N/A;   Social History:  reports that she quit smoking about 10 years ago. Her smoking use included cigarettes. She has a 46.00 pack-year smoking history. She has never used smokeless tobacco. She reports that she does not drink alcohol and does not use drugs.  Allergies  Allergen Reactions   Contrast Media [Iodinated Contrast Media] Anaphylaxis, Shortness Of Breath and Other (See Comments)    Could not breath   Iohexol Anaphylaxis, Shortness Of Breath and Other (See Comments)    Immediately could not breathe   Lisinopril Anaphylaxis, Shortness Of Breath and Rash   Sulfa Antibiotics Anaphylaxis and Other (See Comments)    Historical from mother, pt states that mother says she almost died from this drug   Latex Other (See Comments)    Unless against on skin for a long time, blisters. Short term is okay.    Codeine Nausea Only, Anxiety and Other (See Comments)    insomnia   Levofloxacin Other (See Comments)    insomnia   Lipitor [Atorvastatin Calcium] Rash   Meloxicam Rash    Broke out in a rash on her stomach,back, legs, and behind ears.   Nickel Other (See Comments)    With earrings pt has soreness and drainage from piercing   Vicodin [Hydrocodone-Acetaminophen] Itching    Family History  Problem Relation Age of Onset   Heart disease Mother        tachycardia   Heart disease Father    Diabetes Father    Hypertension Father    Asthma Sister    Allergies Sister    Hyperlipidemia Sister    Hashimoto's thyroiditis Sister    Fibromyalgia Sister    Skin cancer Sister        leg; dx after 13; surgery only   Asthma Sister    Allergies Sister    Hyperlipidemia Sister    Hashimoto's thyroiditis Sister    Fibromyalgia Sister    Breast cancer Sister 60       BRCA2 positive; metastatic in late 37s   Cancer Maternal Grandmother        deceased 24; unk. primary; possibly stomach   Tuberculosis Maternal Grandfather    Stroke Paternal Grandmother 101       died of cerebral hemorrhage   Cancer Other        unknown GYN cancers; MGM's sisters   Other Niece        BRCA2 positive; prophylatic mastectomy    Prior to Admission medications   Medication Sig Start Date End Date Taking? Authorizing Provider  acetaminophen (TYLENOL) 500 MG tablet Take 500-1,000 mg by mouth every 6 (six) hours as needed for moderate pain or headache.   Yes [provider]  albuterol (PROVENTIL) (2.5 MG/3ML) 0.083% nebulizer solution Take 3 mLs (2.5 mg total) by nebulization every 6 (six) hours as needed for wheezing or shortness of breath. 03/10/22  Yes [name], MD  albuterol (VENTOLIN HFA) 108 (90 Base) MCG/ACT inhaler Inhale 2 puffs into the lungs every 6 (six) hours as needed for wheezing or shortness of breath. 07/10/21  Yes [name], MD  apixaban (ELIQUIS) 5 MG TABS tablet Take 1  tablet (5 mg total) by mouth 2 (two) times daily. 01/08/22  Yes [name], MD  bisoprolol-hydrochlorothiazide [facility]) 10-6.25 MG tablet Take 1 tablet by mouth daily. 02/03/22  Yes [name], MD  cetirizine (ZYRTEC) 10 MG tablet Take 10  mg by mouth daily.   Yes [provider]  clobetasol ointment (TEMOVATE) 1.60 % 1 application  every 14 (fourteen) days. 07/25/21  Yes [provider]  escitalopram (LEXAPRO) 10 MG tablet Take 1 tablet (10 mg total) by mouth daily. Start at 1/2 tablet once daily; increase after 1 week if/when tolerated Patient taking differently: Take 10 mg by mouth daily. 02/26/22  Yes [name], MD  guaiFENesin (MUCINEX) 600 MG 12 hr tablet Take 600 mg by mouth at bedtime.   Yes [provider]  metFORMIN (GLUCOPHAGE-XR) 500 MG 24 hr tablet TAKE 1 TABLET THREE TIMES DAILY Patient taking differently: Take 500 mg by mouth daily with breakfast. 03/10/22  Yes [name], MD  montelukast (SINGULAIR) 10 MG tablet Take 1 tablet (10 mg total) by mouth at bedtime. 09/02/21  Yes [name], MD  Semaglutide,0.25 or 0.5MG/DOS, (OZEMPIC, 0.25 OR 0.5 MG/DOSE,) 2 MG/1.5ML SOPN Inject 0.5 mg into the skin once a week. 08/27/21  Yes [name], MD  simvastatin (ZOCOR) 20 MG tablet TAKE 1 TABLET AT BEDTIME 12/12/21  Yes [name], MD  umeclidinium-vilanterol [facility] ELLIPTA) 62.5-25 MCG/ACT AEPB Inhale 1 puff into the lungs daily. 01/08/22  Yes [name], MD  valACYclovir (VALTREX) 500 MG tablet Take 1 tablet (500 mg total) by mouth daily. 08/15/21  Yes [name], MD  Alcohol Swabs (B-D SINGLE USE SWABS REGULAR) PADS Use twice a day when checking blood sugars 12/21/19   [name], MD  glucose blood test strip 1 each by Other route daily. Use as instructed 07/22/21   [name], MD  Lancets MISC 1 each by Does not apply route daily. 07/22/21   [name], MD    Physical Exam: Vitals:   03/23/22 0830 03/23/22 0900 03/23/22 0915 03/23/22 0930  BP: (!) 174/87  (!) 163/86  (!) 154/69  Pulse: 89 86 92 89  Resp: (!) _0 Temp:      TempSrc:      SpO2: 96% 97% (!) 86% 95%  Weight:      Height:       Physical Exam Vitals and nursing note reviewed.  Constitutional:      General: She is awake. She is not in acute distress.    Appearance: She is well-developed. She is obese. She is not diaphoretic.     Interventions: Nasal cannula in place.  HENT:     Head: Normocephalic.     Nose: No rhinorrhea.     Mouth/Throat:     Mouth: Mucous membranes are moist.     Pharynx: No oropharyngeal exudate.  Eyes:     General: No scleral icterus.    Pupils: Pupils are equal, round, and reactive to light.  Neck:     Vascular: No JVD.  Cardiovascular:     Rate and Rhythm: Normal rate and regular rhythm.     Heart sounds: S1 normal and S2 normal.  Pulmonary:     Effort: Pulmonary effort is normal.     Breath sounds: Examination of the right-lower field reveals decreased breath sounds. Examination of the left-lower field reveals decreased breath sounds. Decreased breath sounds and wheezing present. No rhonchi or rales.  Abdominal:     General: Bowel sounds are normal. There is no distension.     Palpations: Abdomen is soft.     Tenderness: There is no abdominal tenderness. There is no guarding.  Musculoskeletal:     Cervical back: Neck supple.  Right lower leg: No edema.     Left lower leg: No edema.  Skin:    General: Skin is warm and dry.     Findings: Bruising and ecchymosis present.     Comments: Small ecchymotic areas of extremities.  Venous stasis changes skin changes on lower pretibial area and feet.  Neurological:     General: No focal deficit present.     Mental Status: She is alert and oriented to person, place, and time.  Psychiatric:        Mood and Affect: Mood normal.        Behavior: Behavior normal. Behavior is cooperative.   Data Reviewed:  There are no new results to review at this time.  Echocardiogram 09/14/2021    1. Left ventricular ejection fraction, by estimation, is 55 to 60%. The  left ventricle has normal function. The left ventricle has no regional  wall motion abnormalities. There is mild concentric left ventricular  hypertrophy. Left ventricular diastolic  parameters are consistent with Grade I diastolic dysfunction (impaired  relaxation).   2. Right ventricular systolic function is normal. The right ventricular  size is normal.   3. Left atrial size was mildly dilated.   4. The mitral valve is normal in structure. No evidence of mitral valve  regurgitation.   5. The aortic valve has been repaired/replaced. Aortic valve  regurgitation is not visualized. There is a 23 mm Ultra, stented (TAVR)  valve present in the aortic position. Procedure Date: 08/14/2020. Echo  findings are consistent with normal structure and   function of the aortic valve prosthesis. Aortic valve mean gradient  measures 13.0 mmHg. Aortic valve Vmax measures 2.18 m/s. Aortic valve  acceleration time measures 87 msec.   Assessment and Plan: Principal Problem:   Acute on chronic respiratory failure with hypoxia (HCC) In the setting of:   COPD with acute exacerbation ([place]) Associated with:   Hemoptysis Hemoptysis has ceased since 0330 Observation/telemetry Continue supplemental oxygen. Methylprednisolone 125 mg IVP x1. Followed by prednisone 40 mg p.o. daily in a.m. Scheduled and as needed bronchodilators. Follow-up CBC and chemistry in the morning.  Continue doxycycline 100 mg p.o. daily.  Active Problems:   Pulmonary embolism ([place]) Has had close to 6 months of therapy. Continue apixaban 5 mg p.o. twice daily. Follow-up with primary care provider.    Diastolic dysfunction Just saw cardiology earlier this month. No signs of decompensation. Continue bisoprolol and hydrochlorothiazide. Furosemide as needed.    Diabetes mellitus type 2 in obese (HCC) Carbohydrate modified diet. Continue metformin 500 mg  p.o. daily. CBG monitoring with RI SS while on glucocorticoids.    Thrombocytopenia ([place]) In the setting of [place] cirrhosis. Monitor platelet count.    Pure hypercholesterolemia   Atherosclerosis of coronary artery   Aortic atherosclerosis (HCC) Continue simvastatin 20 mg p.o. daily. On beta-blocker and apixaban.    AAA (Abdominal aortic aneurysm) without rupture Just saw Dr. Donzetta Matters less than a month ago. Continue surveillance imaging and follow-up with Dr. Donzetta Matters.    Essential hypertension, benign Continue lisinopril 10 mg p.o. daily.   Continue HCTZ 6.25 mg p.o. daily. Monitor BP, HR, renal function electrolytes.    Severe obesity (BMI >= 40) (HCC) Continue lifestyle modifications. Follow-up with primary care provider.    Cirrhosis of liver without ascites (HCC) No signs of decompensation.    Nocturnal hypoxemia History of OSA as well. Does not use CPAP. Prefer to just continue nasal cannula oxygen.  Advance Care Planning:   Code Status: Full Code   Consults:   Family Communication:   Severity of Illness: The appropriate patient status for this patient is OBSERVATION. Observation status is judged to be reasonable and necessary in order to provide the required intensity of service to ensure the patient's safety. The patient's presenting symptoms, physical exam findings, and initial radiographic and laboratory data in the context of their medical condition is felt to place them at decreased risk for further clinical deterioration. Furthermore, it is anticipated that the patient will be medically stable for discharge from the hospital within 2 midnights of admission.   Author: Reubin Milan, MD 03/23/2022 9:58 AM  For on call review [email or web address].   This document was prepared using Dragon voice recognition software and may contain some unintended transcription errors.

## 2022-03-23 NOTE — ED Notes (Signed)
Called lab regarding BMP still in process. Lab states they are looking into it.

## 2022-03-23 NOTE — ED Provider Notes (Signed)
  Physical Exam  BP (!) 154/69   Pulse 89   Temp 97.8 F (36.6 C) (Oral)   Resp 16   Ht '5\' 2"'$  (1.575 m)   Wt 104.3 kg   LMP  (LMP Unknown)   SpO2 95%   BMI 42.07 kg/m   Physical Exam  Procedures  Procedures CRITICAL CARE Performed by: Elgie Congo   Total critical care time: 35 minutes  COPD exacerbation and small-volume hemoptysis requiring 3 DuoNebs and IV steroids and new oxygen requirement.  Critical care time was exclusive of separately billable procedures and treating other patients.  Critical care was necessary to treat or prevent imminent or life-threatening deterioration.  Critical care was time spent personally by me on the following activities: development of treatment plan with patient and/or surrogate as well as nursing, discussions with consultants, evaluation of patient's response to treatment, examination of patient, obtaining history from patient or surrogate, ordering and performing treatments and interventions, ordering and review of laboratory studies, ordering and review of radiographic studies, pulse oximetry and re-evaluation of patient's condition.  ED Course / MDM   Clinical Course as of 03/23/22 0947  Nancy Fetter Mar 23, 2022  0712 hypertension, diabetes, hyperlipidemia, atrial fibrillation anticoagulated on apixaban, COPD on home oxygen, abdominal aortic aneurysm, cirrhosis of the liver and comes in by ambulance because of hemoptysis.  Follow up CTA to rule out PE. Small volume hemoptysis.  Hemoglobin 14 and stable.  Platelets 133 stable for patient.  If negative, refer to pulmonology.  [VB]  0805 CTA negative for PE.  No evidence of pneumonia.  Evidence of emphysema. [VB]  [id number] Patient was on oxygen when I reassessed her.  She says she is not on oxygen during the day.    She is wheezing at the bases.  We will give DuoNebs and doxycycline for suspected COPD exacerbation as she endorses viral URI symptoms and increased cough since the beginning of the  week currently taking steroids since Thursday.  Will reassess after. [VB]  8832 During patient's observation period and giving DuoNebs off oxygen she was sitting and resting comfortably satting 82% on room air.  She has been restarted on 3 L nasal cannula.  I have ordered 85 mg of IV Solu-Medrol to round out to 125 mg IV Solu-Medrol for suspected COPD exacerbation.  No further hemoptysis in the ER.  Will page for admission for new hypoxia and COPD exacerbation and small-volume hemoptysis. [VB]  [phone] Discussed case with hospitalist will put in orders for admission. [VB]    Clinical Course User Index [VB] Elgie Congo, MD   Medical Decision Making Amount and/or Complexity of Data Reviewed Labs: ordered. Radiology: ordered.  Risk Prescription drug management. Decision regarding hospitalization.          Elgie Congo, MD 03/23/22 [phone]

## 2022-03-23 NOTE — ED Triage Notes (Signed)
BIB [facility] with c/o shortness of breath throughout the day. Choked on cornbread earlier today, refused EMS transport. Called back out because she was coughing up scant amount of blood with clots. SPO2 in low 90s. On 2-3L at home intermittently.

## 2022-03-23 NOTE — ED Provider Notes (Signed)
[facility] DEPT Provider Note   CSN: [id number] Arrival date & time: 03/23/22  0220     History  No chief complaint on file.   Shannon Schultz is a 76 y.o. female.  The history is provided by the patient and the EMS personnel.  She has history of hypertension, diabetes, hyperlipidemia, atrial fibrillation anticoagulated on apixaban, COPD on home oxygen, abdominal aortic aneurysm, cirrhosis of the liver and comes in by ambulance because of hemoptysis.  She states that she has had a nonproductive cough for about the last 5 days and has been on a course of prednisone for the last 3 days.  Tonight, she was eating and felt some cornbread got stuck in her throat and she could not breathe.  She was able to cough the cornbread up, but following that, she has had 4 separate episodes of hemoptysis.  On 2 occasions, it was blood mixed with mucus, and on 2 occasions there was a small clot.  She called for an ambulance and she was initially placed on oxygen and she felt better and decided not to come to the hospital.  However, she started getting more short of breath and decided to come in to be seen.  She was noted to be hypoxic on arrival to the emergency department and was placed on higher flow oxygen.  She denies fever, chills, sweats.  She denies any chest pain, heaviness, tightness, pressure.   Home Medications Prior to Admission medications   Medication Sig Start Date End Date Taking? Authorizing Provider  acetaminophen (TYLENOL) 500 MG tablet Take 500-1,000 mg by mouth every 6 (six) hours as needed for moderate pain or headache.    [provider]  albuterol (PROVENTIL) (2.5 MG/3ML) 0.083% nebulizer solution Take 3 mLs (2.5 mg total) by nebulization every 6 (six) hours as needed for wheezing or shortness of breath. 03/10/22   [name], MD  albuterol (VENTOLIN HFA) 108 (90 Base) MCG/ACT inhaler Inhale 2 puffs into the lungs every 6 (six) hours as needed  for wheezing or shortness of breath. 07/10/21   [name], MD  Alcohol Swabs (B-D SINGLE USE SWABS REGULAR) PADS Use twice a day when checking blood sugars 12/21/19   [name], MD  apixaban (ELIQUIS) 5 MG TABS tablet Take 1 tablet (5 mg total) by mouth 2 (two) times daily. 01/08/22   [name], MD  bisoprolol-hydrochlorothiazide [facility]) 10-6.25 MG tablet Take 1 tablet by mouth daily. 02/03/22   [name], MD  cetirizine (ZYRTEC) 10 MG tablet Take 10 mg by mouth daily.    [provider]  clobetasol ointment (TEMOVATE) 3.87 % 1 application  once a week. 07/25/21   [provider]  escitalopram (LEXAPRO) 10 MG tablet Take 1 tablet (10 mg total) by mouth daily. Start at 1/2 tablet once daily; increase after 1 week if/when tolerated 02/26/22   [name], MD  glucose blood test strip 1 each by Other route daily. Use as instructed 07/22/21   [name], MD  guaiFENesin (MUCINEX) 600 MG 12 hr tablet Take 600 mg by mouth at bedtime.    [provider]  Lancets MISC 1 each by Does not apply route daily. 07/22/21   [name], MD  metFORMIN (GLUCOPHAGE-XR) 500 MG 24 hr tablet TAKE 1 TABLET THREE TIMES DAILY 03/10/22   [name], MD  montelukast (SINGULAIR) 10 MG tablet Take 1 tablet (10 mg total) by mouth at bedtime. 09/02/21   [name],  MD  Semaglutide,0.25 or 0.'5MG'$ /DOS, (OZEMPIC, 0.25 OR 0.5 MG/DOSE,) 2 MG/1.5ML SOPN Inject 0.5 mg into the skin once a week. 08/27/21   [name], MD  simvastatin (ZOCOR) 20 MG tablet TAKE 1 TABLET AT BEDTIME 12/12/21   [name], MD  umeclidinium-vilanterol [facility] ELLIPTA) 62.5-25 MCG/ACT AEPB Inhale 1 puff into the lungs daily. 01/08/22   [name], MD  valACYclovir (VALTREX) 500 MG tablet Take 1 tablet (500 mg total) by mouth daily. 08/15/21   [name], MD      Allergies    Contrast media [iodinated contrast media], Iohexol, Lisinopril, Sulfa antibiotics, Latex, Codeine, Levofloxacin, Lipitor [atorvastatin calcium],  Meloxicam, Nickel, and Vicodin [hydrocodone-acetaminophen]    Review of Systems   Review of Systems  All other systems reviewed and are negative.   Physical Exam Updated Vital Signs LMP  (LMP Unknown)  Physical Exam Vitals and nursing note reviewed.   76 year old female, resting comfortably and in no acute distress. Vital signs are significant for elevated heart rate, respiratory rate, blood pressure. Oxygen saturation is 94%, which is normal. Head is normocephalic and atraumatic. PERRLA, EOMI. Oropharynx is clear. Neck is nontender and supple without adenopathy or JVD. Back is nontender and there is no CVA tenderness. Lungs are clear without rales, wheezes, or rhonchi. Chest is nontender. Heart has regular rate and rhythm without murmur. Abdomen is soft, flat, nontender. Extremities have no cyanosis or edema, full range of motion is present. Skin is warm and dry without rash. Neurologic: Mental status is normal, cranial nerves are intact, moves all extremities equally.  ED Results / Procedures / Treatments   Labs (all labs ordered are listed, but only abnormal results are displayed) Labs Reviewed  BASIC METABOLIC PANEL - Abnormal; Notable for the following components:      Result Value   Glucose, Bld 198 (*)    All other components within normal limits  CBC WITH DIFFERENTIAL/PLATELET - Abnormal; Notable for the following components:   Platelets 133 (*)    Lymphs Abs 0.5 (*)    All other components within normal limits    EKG EKG Interpretation  Date/Time:  Sunday March 23 2022 02:38:47 EDT Ventricular Rate:  100 PR Interval:  192 QRS Duration: 134 QT Interval:  367 QTC Calculation: 474 R Axis:   -2 Text Interpretation: Sinus tachycardia IVCD, consider atypical LBBB When compared with ECG of 09/14/2021, No significant change was found Confirmed by Delora Fuel ([id number]) on 03/23/2022 2:49:31 AM  Radiology No results found.  Procedures Procedures  Cardiac  monitor shows sinus tachycardia, per my interpretation.  Medications Ordered in ED Medications  methylPREDNISolone sodium succinate (SOLU-MEDROL) 40 mg/mL injection 40 mg (40 mg Intravenous Given 03/23/22 0255)  diphenhydrAMINE (BENADRYL) capsule 50 mg ( Oral See Alternative 03/23/22 0556)    Or  diphenhydrAMINE (BENADRYL) injection 50 mg (50 mg Intravenous Given 03/23/22 0556)  iohexol (OMNIPAQUE) 350 MG/ML injection 100 mL (100 mLs Intravenous Contrast Given 03/23/22 0717)    ED Course/ Medical Decision Making/ A&P Clinical Course as of 03/23/22 0753  Sun Mar 23, 2022  0712 hypertension, diabetes, hyperlipidemia, atrial fibrillation anticoagulated on apixaban, COPD on home oxygen, abdominal aortic aneurysm, cirrhosis of the liver and comes in by ambulance because of hemoptysis.  Follow up CTA to rule out PE. Small volume hemoptysis.  Hemoglobin 14 and stable.  Platelets 133 stable for patient.  If negative, refer to pulmonology.  [VB]    Clinical Course User Index [VB] Elgie Congo, MD  Medical Decision Making Amount and/or Complexity of Data Reviewed Labs: ordered. Radiology: ordered.  Risk Prescription drug management.   Episode of mild hemoptysis in patient on chronic anticoagulation.  Doubt pulmonary embolism in the setting of chronic anticoagulation, consider tracheal or esophageal injury, pneumonia.  I have reviewed and interpreted her electrocardiogram and my interpretation is left bundle branch block unchanged from prior.  I do not feel there is any worthwhile information to be gained by chest x-ray, I have ordered CT angiogram of the chest to look for breakthrough pulmonary embolism, or other pathology that may be leading to hemoptysis and dyspnea.  Old records are reviewed, and she had been admitted on 09/13/2021 for a pulmonary embolism.  I have reviewed and interpreted her laboratory tests, my interpretation is mild thrombocytopenia which is  not felt to be clinically significant.  CT angiogram is still pending.  Case is signed out to Dr. Nechama Guard.  Of note, she has had no further hemoptysis while in the emergency department.  Final Clinical Impression(s) / ED Diagnoses Final diagnoses:  Hemoptysis  Chronic anticoagulation  Thrombocytopenia [facility])    Rx / DC Orders ED Discharge Orders     None         Delora Fuel, MD 11/57/26 [phone]

## 2022-03-24 DIAGNOSIS — E039 Hypothyroidism, unspecified: Secondary | ICD-10-CM | POA: Diagnosis present

## 2022-03-24 DIAGNOSIS — I472 Ventricular tachycardia, unspecified: Secondary | ICD-10-CM | POA: Diagnosis not present

## 2022-03-24 DIAGNOSIS — Z952 Presence of prosthetic heart valve: Secondary | ICD-10-CM | POA: Diagnosis not present

## 2022-03-24 DIAGNOSIS — G4733 Obstructive sleep apnea (adult) (pediatric): Secondary | ICD-10-CM | POA: Diagnosis present

## 2022-03-24 DIAGNOSIS — J9621 Acute and chronic respiratory failure with hypoxia: Secondary | ICD-10-CM | POA: Diagnosis present

## 2022-03-24 DIAGNOSIS — Z79899 Other long term (current) drug therapy: Secondary | ICD-10-CM | POA: Diagnosis not present

## 2022-03-24 DIAGNOSIS — E119 Type 2 diabetes mellitus without complications: Secondary | ICD-10-CM | POA: Diagnosis present

## 2022-03-24 DIAGNOSIS — I700 Atherosclerosis of aorta: Secondary | ICD-10-CM | POA: Diagnosis present

## 2022-03-24 DIAGNOSIS — Z6841 Body Mass Index (BMI) 40.0 and over, adult: Secondary | ICD-10-CM | POA: Diagnosis not present

## 2022-03-24 DIAGNOSIS — K7581 Nonalcoholic steatohepatitis (NASH): Secondary | ICD-10-CM | POA: Diagnosis present

## 2022-03-24 DIAGNOSIS — Z87891 Personal history of nicotine dependence: Secondary | ICD-10-CM | POA: Diagnosis not present

## 2022-03-24 DIAGNOSIS — I4891 Unspecified atrial fibrillation: Secondary | ICD-10-CM | POA: Diagnosis present

## 2022-03-24 DIAGNOSIS — R042 Hemoptysis: Secondary | ICD-10-CM | POA: Diagnosis present

## 2022-03-24 DIAGNOSIS — J441 Chronic obstructive pulmonary disease with (acute) exacerbation: Secondary | ICD-10-CM | POA: Diagnosis present

## 2022-03-24 DIAGNOSIS — E7800 Pure hypercholesterolemia, unspecified: Secondary | ICD-10-CM | POA: Diagnosis present

## 2022-03-24 DIAGNOSIS — D6959 Other secondary thrombocytopenia: Secondary | ICD-10-CM | POA: Diagnosis present

## 2022-03-24 DIAGNOSIS — Z8249 Family history of ischemic heart disease and other diseases of the circulatory system: Secondary | ICD-10-CM | POA: Diagnosis not present

## 2022-03-24 DIAGNOSIS — Z20822 Contact with and (suspected) exposure to covid-19: Secondary | ICD-10-CM | POA: Diagnosis present

## 2022-03-24 DIAGNOSIS — Z9981 Dependence on supplemental oxygen: Secondary | ICD-10-CM | POA: Diagnosis not present

## 2022-03-24 DIAGNOSIS — J439 Emphysema, unspecified: Secondary | ICD-10-CM | POA: Diagnosis present

## 2022-03-24 DIAGNOSIS — Z8616 Personal history of COVID-19: Secondary | ICD-10-CM | POA: Diagnosis not present

## 2022-03-24 DIAGNOSIS — I10 Essential (primary) hypertension: Secondary | ICD-10-CM | POA: Diagnosis present

## 2022-03-24 DIAGNOSIS — Z23 Encounter for immunization: Secondary | ICD-10-CM | POA: Diagnosis present

## 2022-03-24 DIAGNOSIS — K7460 Unspecified cirrhosis of liver: Secondary | ICD-10-CM | POA: Diagnosis present

## 2022-03-24 DIAGNOSIS — I714 Abdominal aortic aneurysm, without rupture, unspecified: Secondary | ICD-10-CM | POA: Diagnosis present

## 2022-03-24 LAB — CBC
HCT: 44 % (ref 36.0–46.0)
Hemoglobin: 14 g/dL (ref 12.0–15.0)
MCH: 29.3 pg (ref 26.0–34.0)
MCHC: 31.8 g/dL (ref 30.0–36.0)
MCV: 92.1 fL (ref 80.0–100.0)
Platelets: 146 10*3/uL — ABNORMAL LOW (ref 150–400)
RBC: 4.78 MIL/uL (ref 3.87–5.11)
RDW: 14.6 % (ref 11.5–15.5)
WBC: 8 10*3/uL (ref 4.0–10.5)
nRBC: 0 % (ref 0.0–0.2)

## 2022-03-24 LAB — GLUCOSE, CAPILLARY
Glucose-Capillary: 109 mg/dL — ABNORMAL HIGH (ref 70–99)
Glucose-Capillary: 237 mg/dL — ABNORMAL HIGH (ref 70–99)
Glucose-Capillary: 299 mg/dL — ABNORMAL HIGH (ref 70–99)
Glucose-Capillary: 156 mg/dL — ABNORMAL HIGH (ref 70–99)

## 2022-03-24 MED ORDER — INFLUENZA VAC A&B SA ADJ QUAD 0.5 ML IM PRSY
0.5000 mL | PREFILLED_SYRINGE | INTRAMUSCULAR | Status: AC
  Administered 2022-03-25: 0.5 mL via INTRAMUSCULAR
  Filled 2022-03-24: qty 0.5

## 2022-03-24 NOTE — Inpatient Diabetes Management (Signed)
Inpatient Diabetes Program Recommendations  AACE/ADA: New Consensus Statement on Inpatient Glycemic Control (2015)  Target Ranges:  Prepandial:   less than 140 mg/dL      Peak postprandial:   less than 180 mg/dL (1-2 hours)      Critically ill patients:  140 - 180 mg/dL   Lab Results  Component Value Date   GLUCAP 109 (H) 03/24/2022   HGBA1C 6.3 (H) 12/09/2021    Review of Glycemic Control  Latest Reference Range & Units 03/23/22 11:52 03/23/22 16:50 03/23/22 22:24 03/24/22 07:18  Glucose-Capillary 70 - 99 mg/dL 196 (H) 278 (H) 242 (H) 109 (H)  (H): Data is abnormally high  Diabetes history: DM2 Outpatient Diabetes medications: Metformin XR 500 mg QAM, Ozempic 0.5 mg weekly Current orders for Inpatient glycemic control: Novolog 0-15 units TID, Metformin XR 500 mg QAM, Prednisone 40 mg QAM  Inpatient Diabetes Program Recommendations:    If postprandials remain elevated, might consider:  Novolog 3 units TID with meals if consumes at least 50% while receiving steroids.    Will continue to follow while inpatient.  Thank you, Reche Dixon, MSN, [place] Diabetes Coordinator Inpatient Diabetes Program [phone] (team pager from 8a-5p)

## 2022-03-24 NOTE — Progress Notes (Signed)
Mobility Specialist - Progress Note  SATURATION QUALIFICATIONS :   Patient Saturation on Room Air at Rest : 95%   Patient Saturation on Room Air while Ambulating 170f : 82%   Patient Saturation on 3L of O2 while Ambulating : 92%    03/24/22 1028  Mobility  HOB Elevated/Bed Position Self regulated  Activity Ambulated with assistance in hallway  Range of Motion/Exercises Active  Level of Assistance Standby assist, set-up cues, supervision of patient - no hands on  Assistive Device Front wheel walker  Distance Ambulated (ft) 250 ft  Activity Response Tolerated well  $Mobility charge 1 Mobility   Pt was found in bed and agreeable to ambulate. Stated being very shaky this morning and feeling weak. Pt at EOS returned to bed with all necessities in reach and RN in room.  BFerd HibbsMobility Specialist

## 2022-03-24 NOTE — Plan of Care (Signed)
  Problem: Activity: Goal: Ability to tolerate increased activity will improve Outcome: Progressing   Problem: Respiratory: Goal: Ability to maintain a clear airway will improve Outcome: Progressing Goal: Levels of oxygenation will improve Outcome: Progressing Goal: Ability to maintain adequate ventilation will improve Outcome: Progressing   Problem: Education: Goal: Knowledge of disease or condition will improve Outcome: Completed/Met Goal: Knowledge of the prescribed therapeutic regimen will improve Outcome: Completed/Met   Problem: Activity: Goal: Will verbalize the importance of balancing activity with adequate rest periods Outcome: Completed/Met

## 2022-03-24 NOTE — Progress Notes (Signed)
PROGRESS NOTE  Shannon Schultz  GYI:[id number] DOB: 1945-10-22 DOA: 03/23/2022 PCP: [name], MD   Brief Narrative:  Patient is a 76-year female with history of osteoarthritis, bladder cancer, right breast cancer, COPD,CAD, COVID-19 virus infection, diverticulosis, liver cirrhosis due to fatty liver disease, hyperlipidemia, herpes simplex infection of the hip on daily suppression with valacyclovir, hypertension, hypothyroidism, type 2 diabetes, migraine headaches, osteopenia, severe aortic stenosis with history of TAVR, sleep apnea not on CPAP, ventral hernia with ventral hernia repair, vitamin D deficiency who has been suffering URI symptoms associated with increased dyspnea, fatigue, mild sore throat, wheezing and productive cough since about a week ago after her husband started with similar symptoms 1 or 2 days prior.  She also complained of blood in the sputum while coughing.  On presentation ,she was hypoxic and transiently placed on high flow oxygen.  She was oxygen only at night at home.  Patient was started on antibiotics, steroids, nebulizer treatment.  COVID, influenza screen negative.  CTA chest did not show any pulmonary emboli or pneumonia.  Assessment & Plan:  Principal Problem:   Acute on chronic respiratory failure with hypoxia (HCC) Active Problems:   Pulmonary embolism (HCC)   Diabetes mellitus type 2 in obese (HCC)   Thrombocytopenia (HCC)   Pure hypercholesterolemia   Essential hypertension, benign   Severe obesity (BMI >= 40) (HCC)   COPD with acute exacerbation (HCC)   Atherosclerosis of coronary artery   Aortic atherosclerosis (HCC)   Cirrhosis of liver without ascites (HCC)   AAA (abdominal aortic aneurysm) without rupture   Nocturnal hypoxemia   Hemoptysis   Diastolic dysfunction   Acute on chronic hypoxic respiratory failure: In the setting of COPD exacerbation.  Uses 2 L at night only.  Currently on 2 L of oxygen per minute.  Continue steroids,  antibiotics  Hemoptysis: Likely from coughing.  Continue to monitor.  Hemoglobin stable, no concern for blood loss  Diabetes type 2: Takes metformin at home.  Continue sliding scale insulin  Diastolic dysfunction: Currently euvolemic.  PE: Continue eliquis  History of [place]: Currently stable  Thrombocytopenia: History of [place] cirrhosis  Hyperlipidemia: Currently on simvastatin  History of coronary artery  disease: Continue beta-blocker, statin  History of abdominal aortic aneurysm: Follows with vascular surgery  Hypertension: Continue lisinopril, hydrochlorothiazide  Severe obesity: BMI of 42  Nocturnal hypoxia, OSA: Does not use CPAP.  Uses 2 L at night         DVT prophylaxis: apixaban (ELIQUIS) tablet 5 mg     Code Status: Full Code  Family Communication: None at the bedside  Patient status:Obs  Patient is from : Home  Anticipated discharge to: Home  Estimated DC date: Tomorrow   Consultants: None  Procedures: None  Antimicrobials:  Anti-infectives (From admission, onward)    Start     Dose/Rate Route Frequency Ordered Stop   03/23/22 2200  doxycycline (VIBRA-TABS) tablet 100 mg        100 mg Oral Every 12 hours 03/23/22 0956 03/28/22 2159   03/23/22 1000  valACYclovir (VALTREX) tablet 500 mg        500 mg Oral Daily 03/23/22 0949     03/23/22 0845  doxycycline (VIBRA-TABS) tablet 100 mg        100 mg Oral  Once 03/23/22 3500 03/23/22 0837       Subjective: Patient seen and examined at the bedside today.  Hemodynamically stable.  On 2 L of oxygen during the day.  Does not  complain of any worsening shortness of breath or cough.  Feels much better.  Has some blood-tinged sputum.  Objective: Vitals:   03/23/22 1920 03/23/22 1952 03/24/22 0011 03/24/22 0422  BP:  130/69 133/65 (!) 140/71  Pulse:  99 92 71  Resp:  '19 17 20  '$ Temp:  98.1 F (36.7 C) 97.7 F (36.5 C) 97.7 F (36.5 C)  TempSrc:  Oral Oral Oral  SpO2: 91% 94% 94% 98%  Weight:       Height:       No intake or output data in the 24 hours ending 03/24/22 0806 Filed Weights   03/23/22 0230  Weight: 104.3 kg    Examination:  General exam: Overall comfortable, not in distress, pleasant  elderly female, obese HEENT: PERRL Respiratory system: Bilateral mild expiratory wheezing Cardiovascular system: S1 & S2 heard, RRR.  Gastrointestinal system: Abdomen is nondistended, soft and nontender. Central nervous system: Alert and oriented Extremities: No edema, no clubbing ,no cyanosis Skin: No rashes, no ulcers,no icterus     Data Reviewed: I have personally reviewed following labs and imaging studies  CBC: Recent Labs  Lab 03/19/22 1006 03/23/22 0244  WBC 3.1* 6.1  NEUTROABS 2.1 5.3  HGB 12.9 14.0  HCT 39.7 43.2  MCV 91.3 91.5  PLT 101* 779*   Basic Metabolic Panel: Recent Labs  Lab 03/19/22 1006 03/23/22 0244 03/23/22 1603  NA 139 139  --   K 3.7 4.0  --   CL 104 106  --   CO2 31 24  --   GLUCOSE 124* 198*  --   BUN 11 18  --   CREATININE 0.66 0.63  --   CALCIUM 9.0 9.1  --   MG  --   --  1.9  PHOS  --   --  2.8     Recent Results (from the past 240 hour(s))  Resp Panel by RT-PCR (Flu A&B, Covid) Anterior Nasal Swab     Status: None   Collection Time: 03/23/22  9:47 AM   Specimen: Anterior Nasal Swab  Result Value Ref Range Status   SARS Coronavirus 2 by RT PCR NEGATIVE NEGATIVE Final    Comment: (NOTE) SARS-CoV-2 target nucleic acids are NOT DETECTED.  The SARS-CoV-2 RNA is generally detectable in upper respiratory specimens during the acute phase of infection. The lowest concentration of SARS-CoV-2 viral copies this assay can detect is 138 copies/mL. A negative result does not preclude SARS-Cov-2 infection and should not be used as the sole basis for treatment or other patient management decisions. A negative result may occur with  improper specimen collection/handling, submission of specimen other than nasopharyngeal swab, presence  of viral mutation(s) within the areas targeted by this assay, and inadequate number of viral copies(<138 copies/mL). A negative result must be combined with clinical observations, patient history, and epidemiological information. The expected result is Negative.  Fact Sheet for Patients:  EntrepreneurPulse.com.au  Fact Sheet for Healthcare Providers:  IncredibleEmployment.be  This test is no t yet approved or cleared by the [place] FDA and  has been authorized for detection and/or diagnosis of SARS-CoV-2 by FDA under an Emergency Use Authorization (EUA). This EUA will remain  in effect (meaning this test can be used) for the duration of the COVID-19 declaration under Section 564(b)(1) of the Act, 21 U.S.C.section 360bbb-3(b)(1), unless the authorization is terminated  or revoked sooner.       Influenza A by PCR NEGATIVE NEGATIVE Final   Influenza B by PCR NEGATIVE  NEGATIVE Final    Comment: (NOTE) The Xpert Xpress SARS-CoV-2/FLU/RSV plus assay is intended as an aid in the diagnosis of influenza from Nasopharyngeal swab specimens and should not be used as a sole basis for treatment. Nasal washings and aspirates are unacceptable for Xpert Xpress SARS-CoV-2/FLU/RSV testing.  Fact Sheet for Patients: EntrepreneurPulse.com.au  Fact Sheet for Healthcare Providers: IncredibleEmployment.be  This test is not yet approved or cleared by the [place] FDA and has been authorized for detection and/or diagnosis of SARS-CoV-2 by FDA under an Emergency Use Authorization (EUA). This EUA will remain in effect (meaning this test can be used) for the duration of the COVID-19 declaration under Section 564(b)(1) of the Act, 21 U.S.C. section 360bbb-3(b)(1), unless the authorization is terminated or revoked.  Performed at [facility], [place] [address] [place], [place] [address]      Radiology  Studies: CT Angio Chest PE W and/or Wo Contrast  Result Date: 03/23/2022 CLINICAL DATA:  History of pulmonary emboli.  Shortness of breath. EXAM: CT ANGIOGRAPHY CHEST WITH CONTRAST TECHNIQUE: Multidetector CT imaging of the chest was performed using the standard protocol during bolus administration of intravenous contrast. Multiplanar CT image reconstructions and MIPs were obtained to evaluate the vascular anatomy. RADIATION DOSE REDUCTION: This exam was performed according to the departmental dose-optimization program which includes automated exposure control, adjustment of the mA and/or kV according to patient size and/or use of iterative reconstruction technique. CONTRAST:  177m OMNIPAQUE IOHEXOL 350 MG/ML SOLN COMPARISON:  CT pulmonary angiogram October 14, 2021 FINDINGS: Cardiovascular: The thoracic aorta is nonaneurysmal with calcified atherosclerotic change identified. No dissection. Left coronary artery disease. The heart is borderline to mildly enlarged. No pulmonary emboli identified. Mediastinum/Nodes: No enlarged mediastinal, hilar, or axillary lymph nodes. Thyroid gland, trachea, and esophagus demonstrate no significant findings. Lungs/Pleura: Central airways are normal. No pneumothorax. Emphysematous changes identified, particularly in the upper lobes. No pulmonary nodules, masses, or infiltrates. Upper Abdomen: Cirrhotic liver with a nodular contour. Torturous abdominal aorta without identified aneurysm or dissection. Musculoskeletal: No chest wall abnormality. No acute or significant osseous findings. Review of the MIP images confirms the above findings. IMPRESSION: 1. No pulmonary emboli. 2. Calcified atherosclerotic change in the thoracic aorta, upper abdominal aorta, and left coronary arteries. 3. Emphysema. 4. Cirrhosis. Aortic Atherosclerosis (ICD10-I70.0) and Emphysema (ICD10-J43.9). Electronically Signed   By: DDorise BullionIII M.D.   On: 03/23/2022 07:56    Scheduled Meds:  apixaban  5  mg Oral BID   bisoprolol  10 mg Oral Daily   And   hydrochlorothiazide  6.25 mg Oral Daily   doxycycline  100 mg Oral Q12H   escitalopram  10 mg Oral Daily   guaiFENesin  600 mg Oral BID   insulin aspart  0-15 Units Subcutaneous TID WC   ipratropium-albuterol  3 mL Nebulization QID   loratadine  10 mg Oral Daily   [START ON 03/26/2022] metFORMIN  500 mg Oral Q breakfast   montelukast  10 mg Oral QHS   predniSONE  40 mg Oral Q breakfast   simvastatin  20 mg Oral QHS   valACYclovir  500 mg Oral Daily   Continuous Infusions:   LOS: 0 days   AShelly Coss MD [facility] P9/25/2023, 8:06 AM

## 2022-03-24 NOTE — Plan of Care (Signed)
  Problem: Education: Goal: Knowledge of disease or condition will improve Outcome: Progressing Goal: Knowledge of the prescribed therapeutic regimen will improve Outcome: Progressing Goal: Individualized Educational Video(s) Outcome: Progressing   Problem: Activity: Goal: Ability to tolerate increased activity will improve Outcome: Progressing Goal: Will verbalize the importance of balancing activity with adequate rest periods Outcome: Progressing   Problem: Education: Goal: Ability to describe self-care measures that may prevent or decrease complications (Diabetes Survival Skills Education) will improve Outcome: Progressing Goal: Individualized Educational Video(s) Outcome: Progressing   Problem: Health Behavior/Discharge Planning: Goal: Ability to identify and utilize available resources and services will improve Outcome: Progressing Goal: Ability to manage health-related needs will improve Outcome: Progressing

## 2022-03-25 ENCOUNTER — Inpatient Hospital Stay (HOSPITAL_COMMUNITY): Payer: Medicare HMO

## 2022-03-25 ENCOUNTER — Telehealth: Payer: Self-pay | Admitting: Pulmonary Disease

## 2022-03-25 DIAGNOSIS — J9621 Acute and chronic respiratory failure with hypoxia: Secondary | ICD-10-CM | POA: Diagnosis not present

## 2022-03-25 LAB — BASIC METABOLIC PANEL
Anion gap: 9 (ref 5–15)
BUN: 26 mg/dL — ABNORMAL HIGH (ref 8–23)
CO2: 26 mmol/L (ref 22–32)
Calcium: 9.4 mg/dL (ref 8.9–10.3)
Chloride: 101 mmol/L (ref 98–111)
Creatinine, Ser: 0.69 mg/dL (ref 0.44–1.00)
GFR, Estimated: >60 mL/min (ref >60)
Glucose, Bld: 121 mg/dL — ABNORMAL HIGH (ref 70–99)
Potassium: 3.9 mmol/L (ref 3.5–5.1)
Sodium: 136 mmol/L (ref 135–145)

## 2022-03-25 LAB — GLUCOSE, CAPILLARY
Glucose-Capillary: 108 mg/dL — ABNORMAL HIGH (ref 70–99)
Glucose-Capillary: 160 mg/dL — ABNORMAL HIGH (ref 70–99)
Glucose-Capillary: 210 mg/dL — ABNORMAL HIGH (ref 70–99)

## 2022-03-25 LAB — MAGNESIUM: Magnesium: 2 mg/dL (ref 1.7–2.4)

## 2022-03-25 MED ORDER — PREDNISONE 20 MG PO TABS: 40.0000 mg | ORAL_TABLET | Freq: Every day | ORAL | 0 refills | Status: AC

## 2022-03-25 MED ORDER — DOXYCYCLINE HYCLATE 100 MG PO TABS: 100.0000 mg | ORAL_TABLET | Freq: Two times a day (BID) | ORAL | 0 refills | Status: AC

## 2022-03-25 MED ORDER — POTASSIUM CHLORIDE CRYS ER 20 MEQ PO TBCR
40.0000 meq | EXTENDED_RELEASE_TABLET | Freq: Once | ORAL | Status: AC
  Administered 2022-03-25: 40 meq via ORAL
  Filled 2022-03-25: qty 2

## 2022-03-25 MED ORDER — APIXABAN 5 MG PO TABS: 5.0000 mg | ORAL_TABLET | Freq: Two times a day (BID) | ORAL | 2 refills | Status: DC

## 2022-03-25 NOTE — TOC Transition Note (Addendum)
Transition of Care [facility]) - CM/SW Discharge Note   Patient Details  Name: Shannon Schultz MRN: [id number] Date of Birth: [birth year]  [facility] CM/SW Contact:  Dessa Phi, RN Phone Number: 03/25/2022, 1:34 PM   Clinical Narrative:Patient uses [employer] dme for home 02 only HS-will fax orders for home 02 continuous-they will deliver travel tank to rm priro d/c.Patient aware to wait until travel tank delivered to rm-she will discuss ongoing portable tanks to [employer] once @ home. No further CM needs.    -3p-TC [employer]-they are no longer in network with insurance- They will pick up their home 02 set up. [employer] rep Danielle-able to provide dme home 02 travel tank & deliver home 02 set up to home. Patient in agreement to plan.No further CM needs.    Final next level of care: Home/Self Care Barriers to Discharge: No Barriers Identified   Patient Goals and CMS Choice Patient states their goals for this hospitalization and ongoing recovery are::  (Home) CMS Medicare.gov Compare Post Acute Care list provided to:: Patient Choice offered to / list presented to : Patient  Discharge Placement                       Discharge Plan and Services   Discharge Planning Services: CM Consult Post Acute Care Choice: Durable Medical Equipment          DME Arranged: Oxygen DME Agency: [place] Date DME Agency Contacted: 03/25/22 Time DME Agency Contacted: [phone] Representative spoke with at DME Agency: Corey Skains            Social Determinants of Health ([place]) Interventions Housing Interventions: Intervention Not Indicated   Readmission Risk Interventions     No data to display

## 2022-03-25 NOTE — Progress Notes (Signed)
  Transition of Care [facility]) Screening Note   Patient Details  Name: Shannon Schultz Date of Birth: [birth year]   Transition of Care [facility]) CM/SW Contact:    Dessa Phi, RN Phone Number: 03/25/2022, 11:11 AM    Transition of Care Department [facility]) has reviewed patient and no TOC needs have been identified at this time. We will continue to monitor patient advancement through interdisciplinary progression rounds. If new patient transition needs arise, please place a TOC consult.

## 2022-03-25 NOTE — Telephone Encounter (Signed)
Left detailed message for pt that Elisquis rx was sent to pharmacy.

## 2022-03-25 NOTE — Progress Notes (Signed)
Patient had a 10 beat run of vtach , nonsustain, VSS. No c/o  chest pain. Made on call aware. No additional orders given. Will continue to monitor.

## 2022-03-25 NOTE — Progress Notes (Signed)
SATURATION QUALIFICATIONS: (This note is used to comply with regulatory documentation for home oxygen)   Patient Saturations on Room Air at Rest = 92%   Patient Saturations on Room Air while Ambulating = 81%   Patient Saturations on 2 Liters / 3 Liters of oxygen while Ambulating = 88% / 91%

## 2022-03-25 NOTE — Evaluation (Signed)
Physical Therapy Evaluation-1x Patient Details Name: Shannon Schultz MRN: [id number] DOB: [birth year] Today's Date: 03/25/2022   SATURATION QUALIFICATIONS: (This note is used to comply with regulatory documentation for home oxygen)  Patient Saturations on Room Air at Rest = 92%  Patient Saturations on Room Air while Ambulating = 81%  Patient Saturations on 2 Liters / 3 Liters of oxygen while Ambulating = 88% / 91%    History of Present Illness  76 yo female admitted with acute on chronic respiratory failure. Hx of COPD, OA, R hip sg, bladder ca, breast ca, COVID, CAD, liver cirrhosis, DM, hernia repair  Clinical Impression  On eval, pt is Mod Ind with mobility. She walked ~275 feet with a RW. Dyspnea 2/4 with activity. Pt currently requires O2 for mobility. No acute PT needs. Pt can continue to mobilize with nursing and/or mobility specialists. 1x eval       Recommendations for follow up therapy are one component of a multi-disciplinary discharge planning process, led by the attending physician.  Recommendations may be updated based on patient status, additional functional criteria and insurance authorization.  Follow Up Recommendations No PT follow up      Assistance Recommended at Discharge PRN  Patient can return home with the following       Equipment Recommendations None recommended by PT  Recommendations for Other Services       Functional Status Assessment Patient has had a recent decline in their functional status and demonstrates the ability to make significant improvements in function in a reasonable and predictable amount of time.     Precautions / Restrictions Precautions Precautions: Fall Precaution Comments: monitor O2 Restrictions Weight Bearing Restrictions: No      Mobility  Bed Mobility               General bed mobility comments: oob in recliner    Transfers Overall transfer level: Needs assistance Equipment used: Rolling walker (2  wheels) Transfers: Sit to/from Stand Sit to Stand: Modified independent (Device/Increase time)                Ambulation/Gait Ambulation/Gait assistance: Modified independent (Device/Increase time) Gait Distance (Feet): 275 Feet Assistive device: Rolling walker (2 wheels) Gait Pattern/deviations: Step-through pattern, Decreased stride length       General Gait Details: Mod Ind with RW  Stairs            Wheelchair Mobility    Modified Rankin (Stroke Patients Only)       Balance Overall balance assessment: Mild deficits observed, not formally tested                                           Pertinent Vitals/Pain Pain Assessment Pain Assessment: Faces Faces Pain Scale: Hurts a little bit Pain Location: R hip, knee Pain Descriptors / Indicators: Discomfort Pain Intervention(s): Monitored during session    Home Living Family/patient expects to be discharged to:: Private residence Living Arrangements: Spouse/significant other Available Help at Discharge: Family Type of Home: House Home Access: Stairs to enter   Technical brewer of Steps: 1 threshold   Home Layout: One level Home Equipment: Rollator (4 wheels);Rolling Walker (2 wheels)      Prior Function Prior Level of Function : Independent/Modified Independent             Mobility Comments: uses rollator since hip fx; wears O2 at night at  baseline       Hand Dominance        Extremity/Trunk Assessment   Upper Extremity Assessment Upper Extremity Assessment: Overall WFL for tasks assessed    Lower Extremity Assessment Lower Extremity Assessment: Generalized weakness    Cervical / Trunk Assessment Cervical / Trunk Assessment: Normal  Communication   Communication: No difficulties  Cognition Arousal/Alertness: Awake/alert Behavior During Therapy: WFL for tasks assessed/performed Overall Cognitive Status: Within Functional Limits for tasks assessed                                           General Comments      Exercises     Assessment/Plan    PT Assessment Patient does not need any further PT services (can mobilize with MS or nursing)  PT Problem List         PT Treatment Interventions      PT Goals (Current goals can be found in the Care Plan section)  Acute Rehab PT Goals Patient Stated Goal: home PT Goal Formulation: All assessment and education complete, DC therapy    Frequency       Co-evaluation               AM-PAC PT "6 Clicks" Mobility  Outcome Measure Help needed turning from your back to your side while in a flat bed without using bedrails?: None Help needed moving from lying on your back to sitting on the side of a flat bed without using bedrails?: None Help needed moving to and from a bed to a chair (including a wheelchair)?: None Help needed standing up from a chair using your arms (e.g., wheelchair or bedside chair)?: None Help needed to walk in hospital room?: None Help needed climbing 3-5 steps with a railing? : None 6 Click Score: 24    End of Session Equipment Utilized During Treatment: Oxygen Activity Tolerance: Patient tolerated treatment well Patient left: in chair;with call bell/phone within reach        Time: 0957-1016 PT Time Calculation (min) (ACUTE ONLY): 19 min   Charges:   PT Evaluation $PT Eval Low Complexity: [place], PT Acute Rehabilitation  Office: [phone] Pager: [phone]

## 2022-03-25 NOTE — Discharge Summary (Addendum)
Physician Discharge Summary  Shannon Schultz:[id number] DOB: 06/11/1946 DOA: 03/23/2022  PCP: [name], MD  Admit date: 03/23/2022 Discharge date: 03/25/2022  Admitted From: Home Disposition:  Home  Discharge Condition:Stable CODE STATUS:FULL Diet recommendation: Heart Healthy   Brief/Interim Summary:  Patient is a 76-year female with history of osteoarthritis, bladder cancer, right breast cancer, COPD,CAD, COVID-19 virus infection, diverticulosis, liver cirrhosis due to fatty liver disease, hyperlipidemia, herpes simplex infection of the hip on daily suppression with valacyclovir, hypertension, hypothyroidism, type 2 diabetes, migraine headaches, osteopenia, severe aortic stenosis with history of TAVR, sleep apnea not on CPAP, ventral hernia with ventral hernia repair, vitamin D deficiency who has been suffering URI symptoms associated with increased dyspnea, fatigue, mild sore throat, wheezing and productive cough since about a week ago after her husband started with similar symptoms 1 or 2 days prior.  She also complained of blood in the sputum while coughing.  On presentation ,she was hypoxic and transiently placed on high flow oxygen.  She was oxygen only at night at home.  Patient was started on antibiotics, steroids, nebulizer treatment.  COVID, influenza screen negative.  CTA chest did not show any pulmonary emboli or pneumonia.  Patient's respiratory status is better, she feels better.  She qualified for home oxygen for 2 L.  Medically stable for discharge.  Following problems were addressed during hospitalization:    Acute on chronic hypoxic respiratory failure: In the setting of COPD exacerbation.  Uses 2 L at night only.  Currently on 2 L of oxygen per minute.  Continue steroids, antibiotics on DC.  Qualified for home oxygen.   Hemoptysis: Likely from coughing.  Continue to monitor.  Hemoglobin stable, no concern for blood loss  Nonsustained V. tach: Observed early this morning.   Currently she is hemodynamically stable.  Normal sinus rhythm.  Potassium, magnesium optimal.  Given extra dose of potassium today.  QTc normal   Diabetes type 2: Takes metformin at home.    Diastolic dysfunction: Currently euvolemic.   PE: Continue eliquis   History of [place]: Currently stable   Thrombocytopenia: Due to [place] cirrhosis   Hyperlipidemia: Currently on simvastatin   History of coronary artery  disease: Continue beta-blocker, statin   History of abdominal aortic aneurysm: Follows with vascular surgery   Hypertension: Continue lisinopril, hydrochlorothiazide   Severe obesity: BMI of 42   Nocturnal hypoxia, OSA: Does not use CPAP.  Uses 2 L at night     Discharge Diagnoses:  Principal Problem:   Acute on chronic respiratory failure with hypoxia (HCC) Active Problems:   Pulmonary embolism (HCC)   Diabetes mellitus type 2 in obese (HCC)   Thrombocytopenia ([place])   Pure hypercholesterolemia   Essential hypertension, benign   Severe obesity (BMI >= 40) (HCC)   COPD with acute exacerbation (HCC)   Atherosclerosis of coronary artery   Aortic atherosclerosis (HCC)   Cirrhosis of liver without ascites (HCC)   AAA (abdominal aortic aneurysm) without rupture   Nocturnal hypoxemia   Hemoptysis   Diastolic dysfunction   COPD exacerbation ([place])    Discharge Instructions  Discharge Instructions     Diet - low sodium heart healthy   Complete by: As directed    Discharge instructions   Complete by: As directed    1)Please take prescribed medications as instructed 2)Follow up with your PCP in a week   Increase activity slowly   Complete by: As directed       Allergies as of 03/25/2022  Reactions   Contrast Media [iodinated Contrast Media] Anaphylaxis, Shortness Of Breath, Other (See Comments)   Could not breath   Iohexol Anaphylaxis, Shortness Of Breath, Other (See Comments)   Immediately could not breathe   Lisinopril Anaphylaxis, Shortness Of  Breath, Rash   Sulfa Antibiotics Anaphylaxis, Other (See Comments)   Historical from mother, pt states that mother says she almost died from this drug   Latex Other (See Comments)   Unless against on skin for a long time, blisters. Short term is okay.   Codeine Nausea Only, Anxiety, Other (See Comments)   insomnia   Levofloxacin Other (See Comments)   insomnia   Lipitor [atorvastatin Calcium] Rash   Meloxicam Rash   Broke out in a rash on her stomach,back, legs, and behind ears.   Nickel Other (See Comments)   With earrings pt has soreness and drainage from piercing   Vicodin [hydrocodone-acetaminophen] Itching        Medication List     TAKE these medications    acetaminophen 500 MG tablet Commonly known as: TYLENOL Take 500-1,000 mg by mouth every 6 (six) hours as needed for moderate pain or headache.   albuterol 108 (90 Base) MCG/ACT inhaler Commonly known as: VENTOLIN HFA Inhale 2 puffs into the lungs every 6 (six) hours as needed for wheezing or shortness of breath.   albuterol (2.5 MG/3ML) 0.083% nebulizer solution Commonly known as: PROVENTIL Take 3 mLs (2.5 mg total) by nebulization every 6 (six) hours as needed for wheezing or shortness of breath.   Anoro Ellipta 62.5-25 MCG/ACT Aepb Generic drug: umeclidinium-vilanterol Inhale 1 puff into the lungs daily.   apixaban 5 MG Tabs tablet Commonly known as: ELIQUIS Take 1 tablet (5 mg total) by mouth 2 (two) times daily.   B-D SINGLE USE SWABS REGULAR Pads Use twice a day when checking blood sugars   bisoprolol-hydrochlorothiazide 10-6.25 MG tablet Commonly known as: ZIAC Take 1 tablet by mouth daily.   cetirizine 10 MG tablet Commonly known as: ZYRTEC Take 10 mg by mouth daily.   clobetasol ointment 0.05 % Commonly known as: TEMOVATE 1 application  every 14 (fourteen) days.   doxycycline 100 MG tablet Commonly known as: VIBRA-TABS Take 1 tablet (100 mg total) by mouth every 12 (twelve) hours for 3  days.   escitalopram 10 MG tablet Commonly known as: Lexapro Take 1 tablet (10 mg total) by mouth daily. Start at 1/2 tablet once daily; increase after 1 week if/when tolerated What changed: additional instructions   glucose blood test strip 1 each by Other route daily. Use as instructed   guaiFENesin 600 MG 12 hr tablet Commonly known as: MUCINEX Take 600 mg by mouth at bedtime.   Lancets Misc 1 each by Does not apply route daily.   metFORMIN 500 MG 24 hr tablet Commonly known as: GLUCOPHAGE-XR TAKE 1 TABLET THREE TIMES DAILY What changed: when to take this   montelukast 10 MG tablet Commonly known as: SINGULAIR Take 1 tablet (10 mg total) by mouth at bedtime.   Ozempic (0.25 or 0.5 MG/DOSE) 2 MG/1.5ML Sopn Generic drug: Semaglutide(0.25 or 0.'5MG'$ /DOS) Inject 0.5 mg into the skin once a week.   predniSONE 20 MG tablet Commonly known as: DELTASONE Take 2 tablets (40 mg total) by mouth daily with breakfast for 3 days. Start taking on: March 26, 2022   simvastatin 20 MG tablet Commonly known as: ZOCOR TAKE 1 TABLET AT BEDTIME   valACYclovir 500 MG tablet Commonly known as: VALTREX Take 1  tablet (500 mg total) by mouth daily.               Durable Medical Equipment  (From admission, onward)           Start     Ordered   03/25/22 1055  For home use only DME oxygen  Once       Question Answer Comment  Length of Need Lifetime   Mode or (Route) Nasal cannula   Liters per Minute 2   Frequency Continuous (stationary and portable oxygen unit needed)   Oxygen delivery system Gas      03/25/22 1054            Follow-up Information     [name], MD. Schedule an appointment as soon as possible for a visit in 1 week(s).   Specialty: Family Medicine Contact information: [place] [address] [phone]                Allergies  Allergen Reactions   Contrast Media [Iodinated Contrast Media] Anaphylaxis,  Shortness Of Breath and Other (See Comments)    Could not breath   Iohexol Anaphylaxis, Shortness Of Breath and Other (See Comments)    Immediately could not breathe   Lisinopril Anaphylaxis, Shortness Of Breath and Rash   Sulfa Antibiotics Anaphylaxis and Other (See Comments)    Historical from mother, pt states that mother says she almost died from this drug   Latex Other (See Comments)    Unless against on skin for a long time, blisters. Short term is okay.   Codeine Nausea Only, Anxiety and Other (See Comments)    insomnia   Levofloxacin Other (See Comments)    insomnia   Lipitor [Atorvastatin Calcium] Rash   Meloxicam Rash    Broke out in a rash on her stomach,back, legs, and behind ears.   Nickel Other (See Comments)    With earrings pt has soreness and drainage from piercing   Vicodin [Hydrocodone-Acetaminophen] Itching    Consultations: None   Procedures/Studies: CT Angio Chest PE W and/or Wo Contrast  Result Date: 03/23/2022 CLINICAL DATA:  History of pulmonary emboli.  Shortness of breath. EXAM: CT ANGIOGRAPHY CHEST WITH CONTRAST TECHNIQUE: Multidetector CT imaging of the chest was performed using the standard protocol during bolus administration of intravenous contrast. Multiplanar CT image reconstructions and MIPs were obtained to evaluate the vascular anatomy. RADIATION DOSE REDUCTION: This exam was performed according to the departmental dose-optimization program which includes automated exposure control, adjustment of the mA and/or kV according to patient size and/or use of iterative reconstruction technique. CONTRAST:  149m OMNIPAQUE IOHEXOL 350 MG/ML SOLN COMPARISON:  CT pulmonary angiogram October 14, 2021 FINDINGS: Cardiovascular: The thoracic aorta is nonaneurysmal with calcified atherosclerotic change identified. No dissection. Left coronary artery disease. The heart is borderline to mildly enlarged. No pulmonary emboli identified. Mediastinum/Nodes: No enlarged  mediastinal, hilar, or axillary lymph nodes. Thyroid gland, trachea, and esophagus demonstrate no significant findings. Lungs/Pleura: Central airways are normal. No pneumothorax. Emphysematous changes identified, particularly in the upper lobes. No pulmonary nodules, masses, or infiltrates. Upper Abdomen: Cirrhotic liver with a nodular contour. Torturous abdominal aorta without identified aneurysm or dissection. Musculoskeletal: No chest wall abnormality. No acute or significant osseous findings. Review of the MIP images confirms the above findings. IMPRESSION: 1. No pulmonary emboli. 2. Calcified atherosclerotic change in the thoracic aorta, upper abdominal aorta, and left coronary arteries. 3. Emphysema. 4. Cirrhosis. Aortic Atherosclerosis (ICD10-I70.0) and Emphysema (ICD10-J43.9). Electronically Signed  By: Dorise Bullion III M.D.   On: 03/23/2022 07:56   VAS [place] LOWER EXTREMITY VENOUS REFLUX  Result Date: 02/26/2022  Lower Venous Reflux Study Patient Name:  Shannon Schultz  Date of Exam:   02/26/2022 Medical Rec #: [id number]      Accession #:    [id number] Date of Birth: [birth year]     Patient Gender: F Patient Age:   66 years Exam Location:  [facility] Procedure:      VAS [place] LOWER EXTREMITY VENOUS REFLUX Referring Phys: Servando Snare --------------------------------------------------------------------------------  Indications: Varicosities of the left leg.  Performing Technologist: Ronal Fear RVS, RCS  Examination Guidelines: A complete evaluation includes B-mode imaging, spectral Doppler, color Doppler, and power Doppler as needed of all accessible portions of each vessel. Bilateral testing is considered an integral part of a complete examination. Limited examinations for reoccurring indications may be performed as noted. The reflux portion of the exam is performed with the patient in reverse Trendelenburg. Significant venous reflux is defined as >500 ms in the superficial venous  system, and >1 second in the deep venous system.  +------------------+---------+------+-----------+------------+-----------------+ LEFT              Reflux NoRefluxReflux TimeDiameter cmsComments                                      Yes                                           +------------------+---------+------+-----------+------------+-----------------+ CFV                         yes   >1 second                               +------------------+---------+------+-----------+------------+-----------------+ FV mid            no                                                      +------------------+---------+------+-----------+------------+-----------------+ Popliteal         no                                                      +------------------+---------+------+-----------+------------+-----------------+ GSV at SFJ                  yes    >500 ms      0.78                      +------------------+---------+------+-----------+------------+-----------------+ GSV prox thigh    no                            0.41                      +------------------+---------+------+-----------+------------+-----------------+ GSV mid  thigh               yes    >500 ms      0.44                      +------------------+---------+------+-----------+------------+-----------------+ GSV dist thigh    no                            0.52    large branch                                                              feeding the                                                               posterior thigh                                                           varicosities      +------------------+---------+------+-----------+------------+-----------------+ GSV at knee       no                            0.50                      +------------------+---------+------+-----------+------------+-----------------+ SSV Pop Fossa     no                             0.20                      +------------------+---------+------+-----------+------------+-----------------+ anterior accessoryno                            0.48                      +------------------+---------+------+-----------+------------+-----------------+   Summary: Left: - No evidence of deep vein thrombosis from the common femoral through the popliteal veins. - No evidence of superficial venous thrombosis. - The common femoral vein is not competent. - The great saphenous vein is not competent. - The small saphenous vein is competent. Of note: reflux at the [facility] and mid thigh GSV could only be demonstrated with manuel compression of the abdomen; valsalva and distal augmentation did not produce reflux.  *See table(s) above for measurements and observations. Electronically signed by Servando Snare MD on 02/26/2022 at 4:16:39 PM.    Final       Subjective: Patient seen and examined at bedside today.  Hemodynamically stable for discharge.  I called the husband and discussed about discharge planning.  Discharge Exam: Vitals:   03/25/22 0900 03/25/22 0905  BP:    Pulse:  Resp:    Temp:    SpO2: (!) 79% 97%   Vitals:   03/25/22 0812 03/25/22 0857 03/25/22 0900 03/25/22 0905  BP:      Pulse:      Resp:      Temp:      TempSrc:      SpO2: 97% 95% (!) 79% 97%  Weight:      Height:        General: Pt is alert, awake, not in acute distress Cardiovascular: RRR, S1/S2 +, no rubs, no gallops Respiratory: CTA bilaterally, no wheezing, no rhonchi Abdominal: Soft, NT, ND, bowel sounds + Extremities: no edema, no cyanosis    The results of significant diagnostics from this hospitalization (including imaging, microbiology, ancillary and laboratory) are listed below for reference.     Microbiology: Recent Results (from the past 240 hour(s))  Resp Panel by RT-PCR (Flu A&B, Covid) Anterior Nasal Swab     Status: None   Collection Time: 03/23/22  9:47 AM   Specimen:  Anterior Nasal Swab  Result Value Ref Range Status   SARS Coronavirus 2 by RT PCR NEGATIVE NEGATIVE Final    Comment: (NOTE) SARS-CoV-2 target nucleic acids are NOT DETECTED.  The SARS-CoV-2 RNA is generally detectable in upper respiratory specimens during the acute phase of infection. The lowest concentration of SARS-CoV-2 viral copies this assay can detect is 138 copies/mL. A negative result does not preclude SARS-Cov-2 infection and should not be used as the sole basis for treatment or other patient management decisions. A negative result may occur with  improper specimen collection/handling, submission of specimen other than nasopharyngeal swab, presence of viral mutation(s) within the areas targeted by this assay, and inadequate number of viral copies(<138 copies/mL). A negative result must be combined with clinical observations, patient history, and epidemiological information. The expected result is Negative.  Fact Sheet for Patients:  EntrepreneurPulse.com.au  Fact Sheet for Healthcare Providers:  IncredibleEmployment.be  This test is no t yet approved or cleared by the [place] FDA and  has been authorized for detection and/or diagnosis of SARS-CoV-2 by FDA under an Emergency Use Authorization (EUA). This EUA will remain  in effect (meaning this test can be used) for the duration of the COVID-19 declaration under Section 564(b)(1) of the Act, 21 U.S.C.section 360bbb-3(b)(1), unless the authorization is terminated  or revoked sooner.       Influenza A by PCR NEGATIVE NEGATIVE Final   Influenza B by PCR NEGATIVE NEGATIVE Final    Comment: (NOTE) The Xpert Xpress SARS-CoV-2/FLU/RSV plus assay is intended as an aid in the diagnosis of influenza from Nasopharyngeal swab specimens and should not be used as a sole basis for treatment. Nasal washings and aspirates are unacceptable for Xpert Xpress SARS-CoV-2/FLU/RSV testing.  Fact  Sheet for Patients: EntrepreneurPulse.com.au  Fact Sheet for Healthcare Providers: IncredibleEmployment.be  This test is not yet approved or cleared by the [place] FDA and has been authorized for detection and/or diagnosis of SARS-CoV-2 by FDA under an Emergency Use Authorization (EUA). This EUA will remain in effect (meaning this test can be used) for the duration of the COVID-19 declaration under Section 564(b)(1) of the Act, 21 U.S.C. section 360bbb-3(b)(1), unless the authorization is terminated or revoked.  Performed at [facility], [place] [address] [place], [place] [address]      Labs: BNP (last 3 results) Recent Labs    04/02/21 0832  BNP 50.5   Basic Metabolic Panel: Recent Labs  Lab 03/19/22 1006  03/23/22 0244 03/23/22 1603 03/25/22 0816  NA 139 139  --  136  K 3.7 4.0  --  3.9  CL 104 106  --  101  CO2 31 24  --  26  GLUCOSE 124* 198*  --  121*  BUN 11 18  --  26*  CREATININE 0.66 0.63  --  0.69  CALCIUM 9.0 9.1  --  9.4  MG  --   --  1.9 2.0  PHOS  --   --  2.8  --    Liver Function Tests: Recent Labs  Lab 03/19/22 1006  AST 24  ALT 13  ALKPHOS 55  BILITOT 0.7  PROT 6.3*  ALBUMIN 3.9   No results for input(s): "LIPASE", "AMYLASE" in the last 168 hours. No results for input(s): "AMMONIA" in the last 168 hours. CBC: Recent Labs  Lab 03/19/22 1006 03/23/22 0244 03/24/22 1203  WBC 3.1* 6.1 8.0  NEUTROABS 2.1 5.3  --   HGB 12.9 14.0 14.0  HCT 39.7 43.2 44.0  MCV 91.3 91.5 92.1  PLT 101* 133* 146*   Cardiac Enzymes: No results for input(s): "CKTOTAL", "CKMB", "CKMBINDEX", "TROPONINI" in the last 168 hours. BNP: Invalid input(s): "POCBNP" CBG: Recent Labs  Lab 03/24/22 0718 03/24/22 1134 03/24/22 1638 03/24/22 2109 03/25/22 0726  GLUCAP 109* 237* 299* 156* 108*   D-Dimer No results for input(s): "DDIMER" in the last 72 hours. Hgb A1c No results for input(s): "HGBA1C"  in the last 72 hours. Lipid Profile No results for input(s): "CHOL", "HDL", "LDLCALC", "TRIG", "CHOLHDL", "LDLDIRECT" in the last 72 hours. Thyroid function studies No results for input(s): "TSH", "T4TOTAL", "T3FREE", "THYROIDAB" in the last 72 hours.  Invalid input(s): "FREET3" Anemia work up No results for input(s): "VITAMINB12", "FOLATE", "FERRITIN", "TIBC", "IRON", "RETICCTPCT" in the last 72 hours. Urinalysis    Component Value Date/Time   COLORURINE YELLOW 08/10/2020 1026   APPEARANCEUR CLEAR 08/10/2020 1026   LABSPEC 1.025 12/12/2021 1150   PHURINE 5.0 08/10/2020 1026   GLUCOSEU NEGATIVE 08/10/2020 1026   HGBUR NEGATIVE 08/10/2020 1026   BILIRUBINUR negative 12/12/2021 1150   BILIRUBINUR neg 10/08/2015 1355   KETONESUR negative 12/12/2021 1150   KETONESUR NEGATIVE 08/10/2020 1026   PROTEINUR negative 12/12/2021 1150   PROTEINUR NEGATIVE 08/10/2020 1026   UROBILINOGEN negative 10/08/2015 1355   UROBILINOGEN 1.0 06/06/2011 1733   NITRITE Negative 12/12/2021 1150   NITRITE NEGATIVE 08/10/2020 1026   LEUKOCYTESUR Negative 12/12/2021 1150   LEUKOCYTESUR NEGATIVE 08/10/2020 1026   Sepsis Labs Recent Labs  Lab 03/19/22 1006 03/23/22 0244 03/24/22 1203  WBC 3.1* 6.1 8.0   Microbiology Recent Results (from the past 240 hour(s))  Resp Panel by RT-PCR (Flu A&B, Covid) Anterior Nasal Swab     Status: None   Collection Time: 03/23/22  9:47 AM   Specimen: Anterior Nasal Swab  Result Value Ref Range Status   SARS Coronavirus 2 by RT PCR NEGATIVE NEGATIVE Final    Comment: (NOTE) SARS-CoV-2 target nucleic acids are NOT DETECTED.  The SARS-CoV-2 RNA is generally detectable in upper respiratory specimens during the acute phase of infection. The lowest concentration of SARS-CoV-2 viral copies this assay can detect is 138 copies/mL. A negative result does not preclude SARS-Cov-2 infection and should not be used as the sole basis for treatment or other patient management  decisions. A negative result may occur with  improper specimen collection/handling, submission of specimen other than nasopharyngeal swab, presence of viral mutation(s) within the areas targeted by this assay, and  inadequate number of viral copies(<138 copies/mL). A negative result must be combined with clinical observations, patient history, and epidemiological information. The expected result is Negative.  Fact Sheet for Patients:  EntrepreneurPulse.com.au  Fact Sheet for Healthcare Providers:  IncredibleEmployment.be  This test is no t yet approved or cleared by the [place] FDA and  has been authorized for detection and/or diagnosis of SARS-CoV-2 by FDA under an Emergency Use Authorization (EUA). This EUA will remain  in effect (meaning this test can be used) for the duration of the COVID-19 declaration under Section 564(b)(1) of the Act, 21 U.S.C.section 360bbb-3(b)(1), unless the authorization is terminated  or revoked sooner.       Influenza A by PCR NEGATIVE NEGATIVE Final   Influenza B by PCR NEGATIVE NEGATIVE Final    Comment: (NOTE) The Xpert Xpress SARS-CoV-2/FLU/RSV plus assay is intended as an aid in the diagnosis of influenza from Nasopharyngeal swab specimens and should not be used as a sole basis for treatment. Nasal washings and aspirates are unacceptable for Xpert Xpress SARS-CoV-2/FLU/RSV testing.  Fact Sheet for Patients: EntrepreneurPulse.com.au  Fact Sheet for Healthcare Providers: IncredibleEmployment.be  This test is not yet approved or cleared by the [place] FDA and has been authorized for detection and/or diagnosis of SARS-CoV-2 by FDA under an Emergency Use Authorization (EUA). This EUA will remain in effect (meaning this test can be used) for the duration of the COVID-19 declaration under Section 564(b)(1) of the Act, 21 U.S.C. section 360bbb-3(b)(1), unless the  authorization is terminated or revoked.  Performed at [facility], [place] [address] [place], [place] [address]     Please note: You were cared for by a hospitalist during your hospital stay. Once you are discharged, your primary care physician will handle any further medical issues. Please note that NO REFILLS for any discharge medications will be authorized once you are discharged, as it is imperative that you return to your primary care physician (or establish a relationship with a primary care physician if you do not have one) for your post hospital discharge needs so that they can reassess your need for medications and monitor your lab values.    Time coordinating discharge: 40 minutes  SIGNED:   Shelly Coss, MD  [facility] 03/25/2022, 10:56 AM Pager [phone]  If 7PM-7AM, please contact night-coverage [email or web address] Password [email or web address]

## 2022-03-25 NOTE — Inpatient Diabetes Management (Signed)
Inpatient Diabetes Program Recommendations  AACE/ADA: New Consensus Statement on Inpatient Glycemic Control (2015)  Target Ranges:  Prepandial:   less than 140 mg/dL      Peak postprandial:   less than 180 mg/dL (1-2 hours)      Critically ill patients:  140 - 180 mg/dL   Lab Results  Component Value Date   GLUCAP 108 (H) 03/25/2022   HGBA1C 6.3 (H) 12/09/2021    Review of Glycemic Control  Latest Reference Range & Units 03/24/22 07:18 03/24/22 11:34 03/24/22 16:38 03/24/22 21:09 03/25/22 07:26  Glucose-Capillary 70 - 99 mg/dL 109 (H) 237 (H) 299 (H) 156 (H) 108 (H)   Diabetes history: DM2 Outpatient Diabetes medications: Metformin XR 500 mg QAM, Ozempic 0.5 mg weekly Current orders for Inpatient glycemic control: Novolog 0-15 units TID, Metformin XR 500 mg QAM, Prednisone 40 mg QAM  Inpatient Diabetes Program Recommendations:    If postprandials remain elevated, might consider:  Novolog 3 units TID with meals if consumes at least 50% while receiving steroids.    Will continue to follow while inpatient.  Thanks,  Tama Headings RN, MSN, BC-ADM Inpatient Diabetes Coordinator Team Pager [phone] (8a-5p)

## 2022-03-26 ENCOUNTER — Telehealth: Payer: Self-pay

## 2022-03-26 NOTE — Telephone Encounter (Signed)
Transition Care Management Follow-up Telephone Call Date of discharge and from where: [facility] 03/25/22 How have you been since you were released from the hospital? [facility] Any questions or concerns? No  Items Reviewed: Did the pt receive and understand the discharge instructions provided? Yes  Medications obtained and verified? Yes  Other? No  Any new allergies since your discharge? No  Dietary orders reviewed? Yes Do you have support at home? Yes   Home Care and Equipment/Supplies: Were home health services ordered? no Were any new equipment or medical supplies  o ordered?  Yes Oxygen What is the name of the medical supply agency? [facility] Were you able to get the supplies/equipment? yes Do you have any questions related to the use of the equipment or supplies? No  Follow up appointments reviewed:  PCP Hospital f/u appt confirmed? Yes  Scheduled to see Dr. Tomi Bamberger on 04/02/22 @ 2:30. [facility] f/u appt confirmed? No  Are transportation arrangements needed? No  If their condition worsens, is the pt aware to call PCP or go to the Emergency Dept.? Yes Was the patient provided with contact information for the PCP's office or ED? Yes Was to pt encouraged to call back with questions or concerns? Yes

## 2022-03-27 ENCOUNTER — Telehealth: Payer: Self-pay

## 2022-03-27 NOTE — Telephone Encounter (Signed)
Veronica advised of the info [facility]

## 2022-03-27 NOTE — Telephone Encounter (Signed)
Vinnie Level RN called to advise pt has been discharged from the hospital and today she coughed up dark red blood and she advise this happened when she was the hospital. Since this am pt stated that the color has lightened up. Ppt has appt for next week . Please advise if you want to see her sooner. Pt will also need an order for portable o2 tank due to her not being able to use the regular tanks. [place]

## 2022-03-27 NOTE — Telephone Encounter (Signed)
Her counts were stable, no significant loss of blood. Doesn't need sooner visit as long as she doesn't continue to cough up bright red blood (and may need to go back to ER or her pulmonologist if ongoing hemoptysis). Ok for portable oxygen tank

## 2022-03-27 NOTE — Telephone Encounter (Signed)
Walking test will need to be done at her wed appointment. Once note is done order needs to be placed for longer portable o2 concentrator . Community messages will be sent to Darlina Guys and [employer] to complete order. [place]

## 2022-04-01 ENCOUNTER — Telehealth: Payer: Self-pay | Admitting: Licensed Clinical Social Worker

## 2022-04-01 NOTE — Patient Instructions (Signed)
Visit Information  Thank you for taking time to visit with me today. Please don't hesitate to contact me if I can be of assistance to you.   Following are the goals we discussed today:   Goals Addressed             This Visit's Progress    COMPLETED: Care Coordination Activities-No Follow Up Required       Care Coordination Interventions: Active listening / Reflection utilized  Emotional Support Provided Pt reports having a strong support system LCSW informed patient of care coordination services. Pt is not interested at this time and agreed to contact PCP, should needs arise  LCSW informed pt of vaccine availability at PCP office LCSW reviewed upcoming appts         If you are experiencing a Mental Health or [place] or need someone to talk to, please call the Suicide and Crisis Lifeline: 988 call 911   Patient verbalizes understanding of instructions and care plan provided today and agrees to view in [place]. Active MyChart status and patient understanding of how to access instructions and care plan via MyChart confirmed with patient.     No further follow up required:    Christa See, MSW, [email or web address] Phone [phone] 12:49 PM

## 2022-04-01 NOTE — Progress Notes (Unsigned)
No chief complaint on file.  Patient presents for hospital follow-up.  Seen by me 9/18 with cough x 2d, had been around her sick grandchildren, husband also sick.  She was advised to increase frequency of albuterol (MDI, nebs), and to start steroids if worsening. She started prednisone on 9/21.  She went to the ER on 9/24 due to hemoptysis.  She has h/o bilateral PE, and is on Eliquis.   She was hospitalized 9/24-9/26. She was treated with antibiotics, steroids, nebulizer treatments, oxygen.  Negative COVID and influenza tests.  CTA was negative for PE or pneumonia. She was discharged on oxygen, as her saturation dropped to 81% with ambulation on room air (previously was using it just at night, now needs during day). Her hemoglobin remained stable. Lab Results  Component Value Date   WBC 8.0 03/24/2022   HGB 14.0 03/24/2022   HCT 44.0 03/24/2022   MCV 92.1 03/24/2022   PLT 146 (L) 03/24/2022    She is followed by pulmonary, last saw Dr. Loanne Drilling in July. She has no f/u scheduled.  H/o bilateral pulmonary embolism. She is under care of hematology, who recommends lifelong anticoagulation. She last saw them 9/20, and after 6 mos of '5mg'$  BID eliquis, dose was reduced to 2.'5mg'$  BID. They reportedly were going to communicate this to Dr. Loanne Drilling, who prescribes her eliquis, but it appears that their office refilled the '5mg'$  dose #60 with 2 refills on 9/26.   PMH, PSH, SH reviewed    ROS: no fever, chills, headaches, dizziness, chest pain. URI symptoms resolved. Cough No further hemoptysis. No n/v/d, rash. No bleeding   PHYSICAL EXAM:  LMP  (LMP Unknown)   Wt Readings from Last 3 Encounters:  03/23/22 230 lb (104.3 kg)  03/19/22 231 lb 3.2 oz (104.9 kg)  03/17/22 230 lb 6.4 oz (104.5 kg)      ASSESSMENT/PLAN:   Confirm if she decreased eliquis dose (per onc, supposed to decrease to 2.'5mg'$  BID, but the following week her pulm RF the '5mg'$  dose)  COVID booster and RSV vaccines  discussed. She got flu shot on 9/26.  She has no f/u sched with Dr. Loanne Drilling. Needs to schedule f/u.

## 2022-04-01 NOTE — Patient Outreach (Signed)
  Care Coordination   Initial Visit Note   04/01/2022 Name: Shannon Schultz MRN: [id number] DOB: 16-Oct-1945  Shannon Schultz is a 76 y.o. year old female who sees [name], MD for primary care. I spoke with  [name] by phone today.  What matters to the patients health and wellness today?  Care Coordination    Goals Addressed             This Visit's Progress    COMPLETED: Care Coordination Activities-No Follow Up Required       Care Coordination Interventions: Active listening / Reflection utilized  Emotional Support Provided Pt reports having a strong support system LCSW informed patient of care coordination services. Pt is not interested at this time and agreed to contact PCP, should needs arise  LCSW informed pt of vaccine availability at PCP office LCSW reviewed upcoming appts         SDOH assessments and interventions completed:  No     Care Coordination Interventions Activated:  Yes  Care Coordination Interventions:  Yes, provided   Follow up plan: No further intervention required.   Encounter Outcome:  Pt. Refused   Christa See, MSW, [email or web address] Phone [phone] 12:49 PM

## 2022-04-02 ENCOUNTER — Encounter: Payer: Self-pay | Admitting: Physician Assistant

## 2022-04-02 ENCOUNTER — Encounter: Payer: Self-pay | Admitting: Family Medicine

## 2022-04-02 ENCOUNTER — Encounter: Payer: Self-pay | Admitting: *Deleted

## 2022-04-02 ENCOUNTER — Ambulatory Visit (INDEPENDENT_AMBULATORY_CARE_PROVIDER_SITE_OTHER): Payer: Medicare HMO | Admitting: Family Medicine

## 2022-04-02 VITALS — BP 126/60 | HR 80 | Ht 62.0 in | Wt 234.8 lb

## 2022-04-02 DIAGNOSIS — J441 Chronic obstructive pulmonary disease with (acute) exacerbation: Secondary | ICD-10-CM | POA: Diagnosis not present

## 2022-04-02 DIAGNOSIS — R0981 Nasal congestion: Secondary | ICD-10-CM | POA: Diagnosis not present

## 2022-04-02 DIAGNOSIS — J9611 Chronic respiratory failure with hypoxia: Secondary | ICD-10-CM | POA: Diagnosis not present

## 2022-04-02 DIAGNOSIS — Z7901 Long term (current) use of anticoagulants: Secondary | ICD-10-CM

## 2022-04-02 NOTE — Patient Instructions (Addendum)
Please cut the eliquis tablets in half--double check with a pharmacist that these are okay to cut.  Use a pill-cutter since these aren't scored. Your dose was recommended to be reduced to 2.'5mg'$  twice daily for maintenance (after 6 months on the full strength).  Please schedule a follow up with Dr. Loanne Drilling (pulmonary clinic).   They will address whether your medications need to be changed, and how long the oxygen is needed.  I recommend getting the RSV vaccine from the pharmacy. I also recommend getting the COVID vaccine. Vaccines should be separated from each other by about 2 weeks (your flu shot was given 9/26).  Increase the Mucinex to twice daily. Use nasal saline spray frequently to help with the dry nose. You may use some vaseline at the tip (inside) to help with crusting.  Avoid picking at the crusting.

## 2022-04-07 ENCOUNTER — Telehealth: Payer: Self-pay | Admitting: Family Medicine

## 2022-04-07 NOTE — Telephone Encounter (Signed)
PT ASSISTANCE OZEMPIC received, called pt and advised.

## 2022-04-08 ENCOUNTER — Ambulatory Visit: Payer: Medicare HMO | Admitting: Pulmonary Disease

## 2022-04-08 ENCOUNTER — Encounter: Payer: Self-pay | Admitting: Pulmonary Disease

## 2022-04-08 ENCOUNTER — Encounter: Payer: Self-pay | Admitting: Internal Medicine

## 2022-04-08 VITALS — BP 132/68 | HR 85 | Wt 230.4 lb

## 2022-04-08 DIAGNOSIS — J209 Acute bronchitis, unspecified: Secondary | ICD-10-CM | POA: Diagnosis not present

## 2022-04-08 DIAGNOSIS — J440 Chronic obstructive pulmonary disease with acute lower respiratory infection: Secondary | ICD-10-CM | POA: Diagnosis not present

## 2022-04-08 MED ORDER — TRELEGY ELLIPTA 100-62.5-25 MCG/ACT IN AEPB: 1.0000 | INHALATION_SPRAY | Freq: Every day | RESPIRATORY_TRACT | 0 refills | Status: DC

## 2022-04-08 MED ORDER — PREDNISONE 20 MG PO TABS: 20.0000 mg | ORAL_TABLET | Freq: Every day | ORAL | 0 refills | Status: AC

## 2022-04-08 MED ORDER — DOXYCYCLINE HYCLATE 100 MG PO TABS: 100.0000 mg | ORAL_TABLET | Freq: Two times a day (BID) | ORAL | 0 refills | Status: DC

## 2022-04-08 NOTE — Progress Notes (Signed)
'@Patient'  ID: Shannon Schultz, female    DOB: 21-Mar-1946, 76 y.o.   MRN: [id number]  Chief Complaint  Patient presents with   Hospitalization [facility] follow up. Pt states she still has congestion and coughing. Pt is currently on 2L of oxygen but states her oxygen will not stay up. Pt states the chest of chest is hurting and it started Saturday. Pt is on anoro and albuterol, but she states it is not helping her with her cough.      Referring provider: [name], MD  HPI:   76 y.o. woman with past history of moderate COPD based on PFTs 2018 and nocturnal hypoxemia whom are seen in hospital follow-up COPD exacerbation.  Discharge summary reviewed.  Most recent pulmonary note from Dr. Loanne Drilling reviewed.  Patient accompanied by husband.  They report onset of bronchitis-like symptoms a week to week and a half prior to hospitalization.  Increased cough, mucus production, some shortness of breath for the patient.  Unfortunately, in the midst of the symptoms she was eating vegetable soup and describes aspiration, coughing fit while swallowing soup.  This prompted worsening symptoms of worsening shortness of breath which led to ED visit.  In the ED chest x-ray on my review and interpretation is clear.  CTA PE protocol personally reviewed and interpreted as no PE, severe emphysema, otherwise clear lungs 03/19/2022.  She was placed on continuous oxygen.  Significant steroids and antibiotics.  She gradually improved.  However, she was discharged on 2 L nasal cannula continuously.  Since discharge she is doing okay.  Her oxygen saturation at rest remains in the mid 90s.  With exertion when she does not wear oxygen it drops to as low as the high 70s.  When she wears her oxygen with exertion her oxygen saturations usually in the mid 90s.  Over the weekend, last 3 to 4 days she has worsening congestion.  Nasal and sinus congestion.  With worsening productive cough.  And associated worsening shortness of  breath.  She reports good adherence to Anoro therapy.  Has used Trelegy in the past but is afraid of this given this is the "last option."   Questionaires / Pulmonary Flowsheets:   ACT:      No data to display          MMRC: mMRC Dyspnea Scale mMRC Score  10/04/2019  4:18 PM 1    Epworth:      No data to display          Tests:   FENO:  No results found for: "NITRICOXIDE"  PFT:    Latest Ref Rng & Units 09/24/2016   11:52 AM  PFT Results  FVC-Pre L 2.38   FVC-Predicted Pre % 79   FVC-Post L 2.45   FVC-Predicted Post % 82   Pre FEV1/FVC % % 55   Post FEV1/FCV % % 58   FEV1-Pre L 1.32   FEV1-Predicted Pre % 58   FEV1-Post L 1.43   DLCO uncorrected ml/min/mmHg 13.93   DLCO UNC% % 57   DLCO corrected ml/min/mmHg 13.25   DLCO COR %Predicted % 54   DLVA Predicted % 58   TLC L 5.00   TLC % Predicted % 99   RV % Predicted % 108   Personally reviewed and interpreted as moderate fixed obstruction, no significant bronchodilator response, normal limits, DLCO moderately reduced  WALK:     02/25/2018    2:50 PM 02/25/2018    2:49  PM 10/12/2017   10:14 AM 10/12/2017   10:13 AM 01/01/2017   12:49 PM 10/07/2016    4:16 PM 07/22/2016    4:23 PM  SIX MIN WALK  Supplimental Oxygen during Test? (L/min) Yes No Yes No     O2 Flow Rate 2 L/min  2 L/min      Type Continuous  Continuous      2 Minute Oxygen Saturation %     90 % 93 % 84 %  2 Minute HR     90 91 117  4 Minute Oxygen Saturation %     91 % 88 % 89 %  4 Minute HR     114 104 119  6 Minute Oxygen Saturation %     87 % 89 % 86 %  6 Minute HR     120 108 109    Imaging: Personally reviewed and as per EMR and discussion in this note DG CHEST PORT 1 VIEW  Result Date: 03/25/2022 CLINICAL DATA:  Shortness of breath EXAM: PORTABLE CHEST 1 VIEW COMPARISON:  09/13/2021 FINDINGS: Cardiac size is within normal limits. Lung fields are clear of any infiltrates or pulmonary edema. There is no pleural effusion or  pneumothorax. Surgical clips are seen in chest wall on both sides. IMPRESSION: No active disease. Electronically Signed   By: Elmer Picker M.D.   On: 03/25/2022 11:13   CT Angio Chest PE W and/or Wo Contrast  Result Date: 03/23/2022 CLINICAL DATA:  History of pulmonary emboli.  Shortness of breath. EXAM: CT ANGIOGRAPHY CHEST WITH CONTRAST TECHNIQUE: Multidetector CT imaging of the chest was performed using the standard protocol during bolus administration of intravenous contrast. Multiplanar CT image reconstructions and MIPs were obtained to evaluate the vascular anatomy. RADIATION DOSE REDUCTION: This exam was performed according to the departmental dose-optimization program which includes automated exposure control, adjustment of the mA and/or kV according to patient size and/or use of iterative reconstruction technique. CONTRAST:  168m OMNIPAQUE IOHEXOL 350 MG/ML SOLN COMPARISON:  CT pulmonary angiogram October 14, 2021 FINDINGS: Cardiovascular: The thoracic aorta is nonaneurysmal with calcified atherosclerotic change identified. No dissection. Left coronary artery disease. The heart is borderline to mildly enlarged. No pulmonary emboli identified. Mediastinum/Nodes: No enlarged mediastinal, hilar, or axillary lymph nodes. Thyroid gland, trachea, and esophagus demonstrate no significant findings. Lungs/Pleura: Central airways are normal. No pneumothorax. Emphysematous changes identified, particularly in the upper lobes. No pulmonary nodules, masses, or infiltrates. Upper Abdomen: Cirrhotic liver with a nodular contour. Torturous abdominal aorta without identified aneurysm or dissection. Musculoskeletal: No chest wall abnormality. No acute or significant osseous findings. Review of the MIP images confirms the above findings. IMPRESSION: 1. No pulmonary emboli. 2. Calcified atherosclerotic change in the thoracic aorta, upper abdominal aorta, and left coronary arteries. 3. Emphysema. 4. Cirrhosis. Aortic  Atherosclerosis (ICD10-I70.0) and Emphysema (ICD10-J43.9). Electronically Signed   By: DDorise BullionIII M.D.   On: 03/23/2022 07:56    Lab Results: Personally reviewed CBC    Component Value Date/Time   WBC 8.0 03/24/2022 1203   RBC 4.78 03/24/2022 1203   HGB 14.0 03/24/2022 1203   HGB 12.9 03/19/2022 1006   HGB 13.4 09/23/2021 1317   HCT 44.0 03/24/2022 1203   HCT 39.1 09/23/2021 1317   PLT 146 (L) 03/24/2022 1203   PLT 101 (L) 03/19/2022 1006   PLT 148 (L) 09/23/2021 1317   MCV 92.1 03/24/2022 1203   MCV 89 09/23/2021 1317   MCH 29.3 03/24/2022 1203   MCHC  31.8 03/24/2022 1203   RDW 14.6 03/24/2022 1203   RDW 13.4 09/23/2021 1317   LYMPHSABS 0.5 (L) 03/23/2022 0244   LYMPHSABS 1.0 09/23/2021 1317   MONOABS 0.2 03/23/2022 0244   EOSABS 0.0 03/23/2022 0244   EOSABS 0.1 09/23/2021 1317   BASOSABS 0.0 03/23/2022 0244   BASOSABS 0.1 09/23/2021 1317    BMET    Component Value Date/Time   NA 136 03/25/2022 0816   NA 140 12/09/2021 0855   K 3.9 03/25/2022 0816   CL 101 03/25/2022 0816   CO2 26 03/25/2022 0816   GLUCOSE 121 (H) 03/25/2022 0816   BUN 26 (H) 03/25/2022 0816   BUN 15 12/09/2021 0855   CREATININE 0.69 03/25/2022 0816   CREATININE 0.66 03/19/2022 1006   CREATININE 0.75 03/05/2017 0736   CALCIUM 9.4 03/25/2022 0816   GFRNONAA >60 03/25/2022 0816   GFRNONAA >60 03/19/2022 1006   GFRAA 97 07/02/2020 1149    BNP    Component Value Date/Time   BNP 55.4 04/02/2021 0832   BNP 35.1 08/27/2015 0001    ProBNP    Component Value Date/Time   PROBNP 170.0 (H) 09/13/2021 1102    Specialty Problems       Pulmonary Problems   CAP (community acquired pneumonia)   COPD with acute exacerbation (HCC)   Chronic hypoxemic respiratory failure (HCC)   COPD, group B, by GOLD 2017 classification (HCC)   Sinusitis   Upper airway cough syndrome   Nocturnal hypoxemia   Hemoptysis   Pleural effusion   URI (upper respiratory infection)   Acute on chronic  respiratory failure with hypoxia (HCC)   COPD exacerbation (HCC)    Allergies  Allergen Reactions   Contrast Media [Iodinated Contrast Media] Anaphylaxis, Shortness Of Breath and Other (See Comments)    Could not breath   Iohexol Anaphylaxis, Shortness Of Breath and Other (See Comments)    Immediately could not breathe   Lisinopril Anaphylaxis, Shortness Of Breath and Rash   Sulfa Antibiotics Anaphylaxis and Other (See Comments)    Historical from mother, pt states that mother says she almost died from this drug   Latex Other (See Comments)    Unless against on skin for a long time, blisters. Short term is okay.   Codeine Nausea Only, Anxiety and Other (See Comments)    insomnia   Levofloxacin Other (See Comments)    insomnia   Lipitor [Atorvastatin Calcium] Rash   Meloxicam Rash    Broke out in a rash on her stomach,back, legs, and behind ears.   Nickel Other (See Comments)    With earrings pt has soreness and drainage from piercing   Vicodin [Hydrocodone-Acetaminophen] Itching    Immunization History  Administered Date(s) Administered   Fluad Quad(high Dose 65+) 04/07/2019, 04/26/2020, 05/08/2021, 03/25/2022   Influenza Split 05/28/2011   Influenza, High Dose Seasonal PF 04/07/2013, 05/15/2014, 05/30/2015, 03/25/2016, 03/05/2017, 04/01/2018   Influenza, Seasonal, Injecte, Preservative Fre 06/07/2012   PFIZER Comirnaty(Gray Top)Covid-19 Tri-Sucrose Vaccine 10/25/2020   PFIZER(Purple Top)SARS-COV-2 Vaccination 08/20/2019, 09/13/2019, 04/14/2020   Pfizer Covid-19 Vaccine Bivalent Booster 35yr & up 05/08/2021   Pneumococcal Conjugate-13 07/06/2014   Pneumococcal Polysaccharide-23 12/15/2004, 05/28/2011, 05/27/2021   Tdap 02/23/2008, 06/15/2018   Zoster Recombinat (Shingrix) 12/02/2017, 02/06/2018   Zoster, Live 05/17/2010    Past Medical History:  Diagnosis Date   Arthritis    "hands" (05/15/2017)   [place]   Breast [place], [place]    DCIS,bladder ca (just dx)   Colon  polyp    Complication of anesthesia 1992   "local anesthesia" used was hard to awaken from-no problems since (05/15/2017)   COPD (chronic obstructive pulmonary disease) (HCC)    Coronary artery disease    COVID-19 virus infection 05/05/2019   Diverticulosis    Elevated liver enzymes    fatty liver per ultrasound per pt   FHx: BRCA2 gene positive    sister with BRCA2 mutation (pt tested NEGATIVE)   HLD (hyperlipidemia)    HSV (herpes simplex virus) infection    on hip--on daily suppression   Hypertension    Hypothyroidism    took med 7 yrs after birth of 1st child   Impaired glucose tolerance    Migraine    Nonalcoholic steatohepatitis ([facility]) 03/23/2022   On home oxygen therapy    "have it available but I'm not using it" (05/15/2017)   Osteopenia    Personal history of breast cancer 11/15/2021   S/P TAVR (transcatheter aortic valve replacement) 08/14/2020   s/p TAVR with a 26 mm Edwards S3U via the left subclavian approach by Dr. Roxy Manns and Dr. Angelena Form   Severe aortic stenosis    Severe obesity (BMI >= 40) ([place]) 10/06/2014   Sleep apnea    Sleeps with 2 L O2 @ night   Type 2 diabetes mellitus ([place])    Urothelial cancer ([place]) 03/10/2017   Incidental bladder mass noted on CT, papillary urothelial CA on pathology from excision 12/2016; post-op instillation of chemo. Due for f/u cystoscopy 03/2017   Venous insufficiency 09/10/2018   Ventral hernia    Ventral hernia without obstruction or gangrene 05/15/2017   Vitamin D deficiency disease     Tobacco History: Social History   Tobacco Use  Smoking Status Former   Packs/day: 1.00   Years: 46.00   Total pack years: 46.00   Types: Cigarettes   Quit date: 05/31/2011   Years since quitting: 10.8  Smokeless Tobacco Never   Counseling given: Not Answered   Continue to not smoke  Outpatient Encounter Medications as of 04/08/2022  Medication Sig   acetaminophen (TYLENOL) 500 MG tablet Take  500-1,000 mg by mouth every 6 (six) hours as needed for moderate pain or headache.   albuterol (PROVENTIL) (2.5 MG/3ML) 0.083% nebulizer solution Take 3 mLs (2.5 mg total) by nebulization every 6 (six) hours as needed for wheezing or shortness of breath.   albuterol (VENTOLIN HFA) 108 (90 Base) MCG/ACT inhaler Inhale 2 puffs into the lungs every 6 (six) hours as needed for wheezing or shortness of breath.   Alcohol Swabs (B-D SINGLE USE SWABS REGULAR) PADS Use twice a day when checking blood sugars   apixaban (ELIQUIS) 5 MG TABS tablet Take 1 tablet (5 mg total) by mouth 2 (two) times daily.   bisoprolol-hydrochlorothiazide (ZIAC) 10-6.25 MG tablet Take 1 tablet by mouth daily.   cetirizine (ZYRTEC) 10 MG tablet Take 10 mg by mouth daily.   clobetasol ointment (TEMOVATE) 2.35 % 1 application  every 14 (fourteen) days.   doxycycline (VIBRA-TABS) 100 MG tablet Take 1 tablet (100 mg total) by mouth 2 (two) times daily.   escitalopram (LEXAPRO) 10 MG tablet Take 1 tablet (10 mg total) by mouth daily. Start at 1/2 tablet once daily; increase after 1 week if/when tolerated (Patient taking differently: Take 10 mg by mouth daily.)   glucose blood test strip 1 each by Other route daily. Use as instructed   guaiFENesin (MUCINEX) 600 MG 12 hr tablet Take 600 mg by mouth at bedtime.  Lancets MISC 1 each by Does not apply route daily.   metFORMIN (GLUCOPHAGE-XR) 500 MG 24 hr tablet Take 500 mg by mouth daily with breakfast.   montelukast (SINGULAIR) 10 MG tablet Take 1 tablet (10 mg total) by mouth at bedtime.   predniSONE (DELTASONE) 20 MG tablet Take 1 tablet (20 mg total) by mouth daily with breakfast for 5 days.   Semaglutide,0.25 or 0.5MG/DOS, (OZEMPIC, 0.25 OR 0.5 MG/DOSE,) 2 MG/1.5ML SOPN Inject 0.5 mg into the skin once a week.   simvastatin (ZOCOR) 20 MG tablet TAKE 1 TABLET AT BEDTIME   umeclidinium-vilanterol (ANORO ELLIPTA) 62.5-25 MCG/ACT AEPB Inhale 1 puff into the lungs daily.   valACYclovir  (VALTREX) 500 MG tablet Take 1 tablet (500 mg total) by mouth daily.   No facility-administered encounter medications on file as of 04/08/2022.     Review of Systems  Review of Systems  No chest pain with exertion, no orthopnea or PND.  Comprehensive review of systems otherwise negative. Physical Exam  BP 132/68 (BP Location: Left Arm, Patient Position: Sitting, Cuff Size: Normal)   Pulse 85   Wt 230 lb 6.4 oz (104.5 kg)   LMP  (LMP Unknown)   SpO2 98%   BMI 42.14 kg/m   Wt Readings from Last 5 Encounters:  04/08/22 230 lb 6.4 oz (104.5 kg)  04/02/22 234 lb 12.8 oz (106.5 kg)  03/23/22 230 lb (104.3 kg)  03/19/22 231 lb 3.2 oz (104.9 kg)  03/17/22 230 lb 6.4 oz (104.5 kg)    BMI Readings from Last 5 Encounters:  04/08/22 42.14 kg/m  04/02/22 42.95 kg/m  03/23/22 42.07 kg/m  03/19/22 42.29 kg/m  03/17/22 42.14 kg/m     Physical Exam General: Sitting in chair, no acute distress Eyes: EOMI, no icterus Neck: Supple, no JVP Pulmonary: Clear, normal work of breathing, distant Cardiovascular: Warm, no edema MSK: No synovitis, no joint effusion Abdomen: Nondistended, bowel sounds present Neuro: Normal gait, no weakness Psych: Normal mood, full affect   Assessment & Plan:   COPD with recurrent exacerbation: COPD based on PFT 2018 with moderate fixed obstruction.  Recent hospitalization 02/2022 Description of aspiration event but preceding several days of bronchitis symptoms treated for COPD exacerbation.  CT chest clear.  Improved with steroids and antibiotics.  Over the last few days with worsening cough and dyspnea on exertion.  Chest is clear on exam.  Prednisone 20 mg daily for 5 days and doxycycline 100 mg twice daily for 7 days prescribed for exacerbation.  Inhaler therapy Anoro escalated to low-dose Trelegy today given hospitalization with exacerbation and recurrent exacerbation.  Manufacturing assistance paperwork  Acute on chronic hypoxemic respiratory  failure: Previously only wears at night.  Now requires with exertion.  She reports desaturations to the high 70s when not wearing it at home since discharge from the hospital.  Ambulated today and maintain adequate oxygen saturation with POC, new orders placed.  Suspect this will be permanent increase in oxygenation with recent COPD exacerbation, preceding nocturnal hypoxemia.   Return in about 3 months (around 07/09/2022).   Lanier Clam, MD 04/08/2022   This appointment required 65 minutes of patient care (this includes precharting, chart review, review of results, face-to-face care, etc.).

## 2022-04-08 NOTE — Patient Instructions (Addendum)
Nice to meet you  With the worsening cough for several days and a bit worse shortness of breath, I think we should treat you with prednisone 20 mg once a day for 5 days, take in the morning with doxycycline 100 mg twice a day for 7 days.  Your lungs sound clear without wheeze or sign of pneumonia which is good.  Given the recent hospitalization, I recommend increasing your inhaler from Anoro to Trelegy.  I have provided some samples of this.  Rinse your mouth out after use.  We will do paperwork today for manufacturing assistance for Trelegy  We will walk around the office today to see if you qualify for portable oxygen concentrator (POC).  Return to clinic in 3 months with Dr. Loanne Drilling

## 2022-04-09 ENCOUNTER — Telehealth: Payer: Self-pay | Admitting: Pulmonary Disease

## 2022-04-09 NOTE — Telephone Encounter (Signed)
Patient called to request a prescription for her Trelegy medication.  She stated that she got some samples at her last appt. But needs the prescription sent in to the [facility].  Please advise.

## 2022-04-09 NOTE — Telephone Encounter (Signed)
Called and spoke to patient about trelegy. I advised her to fill out the Trelegy paperwork to see if we can get her approved for patient assistance, and if she is approved her refills will come from the program. She verbalized understanding. She states she will bring paperwork by office. Nothing further needed

## 2022-04-10 ENCOUNTER — Telehealth: Payer: Self-pay | Admitting: Pulmonary Disease

## 2022-04-10 NOTE — Telephone Encounter (Signed)
Called and spoke with pt and answered questions about the pt assistance paperwork the best I could. Nothing further needed.

## 2022-04-10 NOTE — Telephone Encounter (Signed)
Called patient back and informed her that I did have her [place] paperwork that she dropped off to me. I told her I would figure out the Pt ID # before I fax off the application. She verbalized understanding. Nothing further needed

## 2022-04-11 ENCOUNTER — Other Ambulatory Visit: Payer: Self-pay

## 2022-04-11 MED ORDER — TRELEGY ELLIPTA 100-62.5-25 MCG/ACT IN AEPB: 1.0000 | INHALATION_SPRAY | Freq: Every day | RESPIRATORY_TRACT | 12 refills | Status: DC

## 2022-04-21 ENCOUNTER — Encounter: Payer: Self-pay | Admitting: Internal Medicine

## 2022-04-21 ENCOUNTER — Ambulatory Visit: Payer: Medicare HMO | Admitting: Internal Medicine

## 2022-04-21 VITALS — BP 140/74 | HR 74 | Temp 98.1°F | Ht 62.0 in | Wt 237.0 lb

## 2022-04-21 DIAGNOSIS — K219 Gastro-esophageal reflux disease without esophagitis: Secondary | ICD-10-CM

## 2022-04-21 DIAGNOSIS — J4489 Other specified chronic obstructive pulmonary disease: Secondary | ICD-10-CM | POA: Diagnosis not present

## 2022-04-21 DIAGNOSIS — J9611 Chronic respiratory failure with hypoxia: Secondary | ICD-10-CM | POA: Diagnosis not present

## 2022-04-21 DIAGNOSIS — J439 Emphysema, unspecified: Secondary | ICD-10-CM

## 2022-04-21 MED ORDER — OMEPRAZOLE 40 MG PO CPDR: 40.0000 mg | DELAYED_RELEASE_CAPSULE | Freq: Every day | ORAL | 5 refills | Status: DC

## 2022-04-21 NOTE — Progress Notes (Signed)
Shannon Schultz    [id number]    [birth year]  Primary Care [facility], Tera Helper, MD Date of Appointment: 04/21/2022 Established Patient Visit  Chief complaint:   Chief Complaint  Patient presents with   Acute Visit    Cough persistent.     HPI: Shannon Schultz is a 76 y.o. woman with history of moderate COPD with emphysema, unprovoked PE, OSA not on CPAP. Primary pulmonologist Dr. Loanne Drilling.    Interval Updates: Here for acute visit for cough.   Notes from Dr. Loanne Drilling reviewed. Saw Dr. Silas Flood ten days ago for acute visit - treated with steroids and antibiotics. No improvement in her symptoms. She continues to have cough which is nocturnal. She sleeps in recliner due to hip bursitis. Worse when she leans back.   She drinks coffee throughout the day. Denies overt heartburn. Feels hoarse voice, froggy throat. No sinus drainage.  Has some clear mucus with cough. . No blood. No worsening dyspnea.  Feeling frustrated that she is on oxygen ever since her aspiration event in September on vegetable soup.   I have reviewed the patient's family social and past medical history and updated as appropriate.   Past Medical History:  Diagnosis Date   Arthritis    "hands" (05/15/2017)   Bladder cancer ([place]) 2018   Breast cancer, right ([place]) 1992   DCIS,bladder ca (just dx)   Colon polyp    Complication of anesthesia 1992   "local anesthesia" used was hard to awaken from-no problems since (05/15/2017)   COPD (chronic obstructive pulmonary disease) (HCC)    Coronary artery disease    COVID-19 virus infection 05/05/2019   Diverticulosis    Elevated liver enzymes    fatty liver per ultrasound per pt   FHx: BRCA2 gene positive    sister with BRCA2 mutation (pt tested NEGATIVE)   HLD (hyperlipidemia)    HSV (herpes simplex virus) infection    on hip--on daily suppression   Hypertension    Hypothyroidism    took med 7 yrs after birth of 1st child   Impaired glucose tolerance     Migraine    Nonalcoholic steatohepatitis ([facility]) 03/23/2022   On home oxygen therapy    "have it available but I'm not using it" (05/15/2017)   Osteopenia    Personal history of breast cancer 11/15/2021   S/P TAVR (transcatheter aortic valve replacement) 08/14/2020   s/p TAVR with a 26 mm Edwards S3U via the left subclavian approach by Dr. Roxy Manns and Dr. Angelena Form   Severe aortic stenosis    Severe obesity (BMI >= 40) ([place]) 10/06/2014   Sleep apnea    Sleeps with 2 L O2 @ night   Type 2 diabetes mellitus ([place])    Urothelial cancer ([place]) 03/10/2017   Incidental bladder mass noted on CT, papillary urothelial CA on pathology from excision 12/2016; post-op instillation of chemo. Due for f/u cystoscopy 03/2017   Venous insufficiency 09/10/2018   Ventral hernia    Ventral hernia without obstruction or gangrene 05/15/2017   Vitamin D deficiency disease     Past Surgical History:  Procedure Laterality Date   ABDOMINAL HERNIA REPAIR  05/15/2017   BREAST BIOPSY Right 1992   BREAST BIOPSY Left 2018   BREAST EXCISIONAL BIOPSY Left 2018   ATYPICAL DUCTAL HYPERPLASIA INVOLVING A COMPLEX   BREAST LUMPECTOMY WITH RADIOACTIVE SEED LOCALIZATION Left 12/22/2016   Procedure: LEFT BREAST LUMPECTOMY WITH RADIOACTIVE SEED LOCALIZATION;  Surgeon: Jovita Kussmaul, MD;  Location: [facility];  Service: General;  Laterality: Left;   CARDIAC CATHETERIZATION     CATARACT EXTRACTION, BILATERAL Bilateral R 03/16/2019 L 03/30/2019   Dr. Kathlen Mody   COLONOSCOPY  2009, 08/2010, 12/2015   Dr. Collene Mares; "only 1 had any polyps" (05/15/2017)   CYSTOSCOPY  04/23/2017   CYSTOSCOPY W/ RETROGRADES Bilateral 01/12/2017   Procedure: CYSTOSCOPY WITH RETROGRADE PYELOGRAM/ EXAM UNDER ANESTHESIA;  Surgeon: Raynelle Bring, MD;  Location: [facility];  Service: Urology;  Laterality: Bilateral;   ENDOVENOUS ABLATION SAPHENOUS VEIN W/ LASER Right 01/09/2022   endovenous laser ablation right greater saphenous vein by Servando Snare MD   EYE SURGERY     FEMUR  IM NAIL Right 08/24/2019   Procedure: INTRAMEDULLARY (IM) NAIL FEMORAL;  Surgeon: Rod Can, MD;  Location: [facility];  Service: Orthopedics;  Laterality: Right;   HERNIA REPAIR     INSERTION OF MESH N/A 05/15/2017   Procedure: INSERTION OF MESH;  Surgeon: Jovita Kussmaul, MD;  Location: [facility];  Service: General;  Laterality: N/A;   LAPAROSCOPIC CHOLECYSTECTOMY  2003   MASTECTOMY Right 1992   RIGHT/LEFT HEART CATH AND CORONARY ANGIOGRAPHY N/A 07/05/2020   Procedure: RIGHT/LEFT HEART CATH AND CORONARY ANGIOGRAPHY;  Surgeon: Burnell Blanks, MD;  Location: [facility];  Service: Cardiovascular;  Laterality: N/A;   TEE WITHOUT CARDIOVERSION N/A 08/14/2020   Procedure: TRANSESOPHAGEAL ECHOCARDIOGRAM (TEE);  Surgeon: Burnell Blanks, MD;  Location: [facility];  Service: Open Heart Surgery;  Laterality: N/A;   TRANSURETHRAL RESECTION OF BLADDER TUMOR WITH MITOMYCIN-C N/A 01/12/2017   Procedure: TRANSURETHRAL RESECTION OF BLADDER TUMOR WITH POSSIBLE POST OPERATIVE INSTILLATION OF MITOMYCIN-C;  Surgeon: Raynelle Bring, MD;  Location: [facility];  Service: Urology;  Laterality: N/A;   TYMPANOSTOMY TUBE PLACEMENT Bilateral 3419F   UMBILICAL HERNIA REPAIR  2003   VENTRAL HERNIA REPAIR N/A 05/15/2017   Procedure: VENTRAL HERNIA REPAIR WITH MESH;  Surgeon: Jovita Kussmaul, MD;  Location: [facility];  Service: General;  Laterality: N/A;    Family History  Problem Relation Age of Onset   Heart disease Mother        tachycardia   Heart disease Father    Diabetes Father    Hypertension Father    Asthma Sister    Allergies Sister    Hyperlipidemia Sister    Hashimoto's thyroiditis Sister    Fibromyalgia Sister    Skin cancer Sister        leg; dx after 59; surgery only   Asthma Sister    Allergies Sister    Hyperlipidemia Sister    Hashimoto's thyroiditis Sister    Fibromyalgia Sister    Breast cancer Sister 69       BRCA2 positive; metastatic in late 82s   Cancer Maternal Grandmother         deceased 42; unk. primary; possibly stomach   Tuberculosis Maternal Grandfather    Stroke Paternal Grandmother 4       died of cerebral hemorrhage   Cancer Other        unknown GYN cancers; MGM's sisters   Other Niece        BRCA2 positive; prophylatic mastectomy    Social History   Occupational History   Occupation: Retired  Tobacco Use   Smoking status: Former    Packs/day: 1.00    Years: 46.00    Total pack years: 46.00    Types: Cigarettes    Quit date: 05/31/2011    Years since quitting: 54.8  Smokeless tobacco: Never  Vaping Use   Vaping Use: Never used  Substance and Sexual Activity   Alcohol use: No   Drug use: No   Sexual activity: Yes    Partners: Male    Birth control/protection: Post-menopausal    Physical Exam: Blood pressure (!) 140/74, pulse 74, temperature 98.1 F (36.7 C), temperature source Oral, height '5\' 2"'  (1.575 m), weight 237 lb (107.5 kg), SpO2 98 %.  Gen:      No acute distress ENT:  on oxygen, no nasal polyps, mucus membranes moist Lungs:    diminished, clear, no wheezes CV:         Regular rate and rhythm; no murmurs, rubs, or gallops.  No pedal edema   Data Reviewed: Imaging: I have personally reviewed the CT PE 02/2022- severe emphysema  PFTs:     Latest Ref Rng & Units 09/24/2016   11:52 AM  PFT Results  FVC-Pre L 2.38   FVC-Predicted Pre % 79   FVC-Post L 2.45   FVC-Predicted Post % 82   Pre FEV1/FVC % % 55   Post FEV1/FCV % % 58   FEV1-Pre L 1.32   FEV1-Predicted Pre % 58   FEV1-Post L 1.43   DLCO uncorrected ml/min/mmHg 13.93   DLCO UNC% % 57   DLCO corrected ml/min/mmHg 13.25   DLCO COR %Predicted % 54   DLVA Predicted % 58   TLC L 5.00   TLC % Predicted % 99   RV % Predicted % 108    I have personally reviewed the patient's PFTs and moderately severe COPD  Labs:  Immunization status: Immunization History  Administered Date(s) Administered   Fluad Quad(high Dose 65+) 04/07/2019, 04/26/2020,  05/08/2021, 03/25/2022   Influenza Split 05/28/2011   Influenza, High Dose Seasonal PF 04/07/2013, 05/15/2014, 05/30/2015, 03/25/2016, 03/05/2017, 04/01/2018   Influenza, Seasonal, Injecte, Preservative Fre 06/07/2012   PFIZER Comirnaty(Gray Top)Covid-19 Tri-Sucrose Vaccine 10/25/2020   PFIZER(Purple Top)SARS-COV-2 Vaccination 08/20/2019, 09/13/2019, 04/14/2020   Pfizer Covid-19 Vaccine Bivalent Booster 47yr & up 05/08/2021   Pneumococcal Conjugate-13 07/06/2014   Pneumococcal Polysaccharide-23 12/15/2004, 05/28/2011, 05/27/2021   Tdap 02/23/2008, 06/15/2018   Zoster Recombinat (Shingrix) 12/02/2017, 02/06/2018   Zoster, Live 05/17/2010    External Records Personally Reviewed: pulmonary, ed, hospital  Assessment:  Moderately Severe COPD FEV1 58% of predicted Chronic cough - likely GERD given no improvement with prednisone  Plan/Recommendations: Continue trelegy with prn albuterol Continue home oxygen Start once daily PPI for suspect GERD Continue home oxygen.   Return to Care: [facility]- [facility].   NLenice Llamas MD Pulmonary and [place]

## 2022-04-21 NOTE — Patient Instructions (Addendum)
Follow up with Dr. Loanne Drilling in January at [facility] - recall placed. We will call you to schedule this appointmetn.   Continue trelegy with prn albuterol (I have followed up on the forms - we filled them out the drug company will contact you once approved.) Continue home oxygen Start once daily omeprazole for suspect GERD. I think this is more likely the cause for your cough.  Continue home oxygen.   What is GERD? Gastroesophageal reflux disease (GERD) is gastroesophageal reflux diseasewhich occurs when the lower esophageal sphincter (LES) opens spontaneously, for varying periods of time, or does not close properly and stomach contents rise up into the esophagus. GER is also called acid reflux or acid regurgitation, because digestive juices--called acids--rise up with the food. The esophagus is the tube that carries food from the mouth to the stomach. The LES is a ring of muscle at the bottom of the esophagus that acts like a valve between the esophagus and stomach.  When acid reflux occurs, food or fluid can be tasted in the back of the mouth. When refluxed stomach acid touches the lining of the esophagus it may cause a burning sensation in the chest or throat called heartburn or acid indigestion. Occasional reflux is common. Persistent reflux that occurs more than twice a week is considered GERD, and it can eventually lead to more serious health problems. People of all ages can have GERD. Studies have shown that GERD may worsen or contribute to asthma, chronic cough, and pulmonary fibrosis.   What are the symptoms of GERD? The main symptom of GERD in adults is frequent heartburn, also called acid indigestion--burning-type pain in the lower part of the mid-chest, behind the breast bone, and in the mid-abdomen.  Not all reflux is acidic in nature, and many patients don't have heart burn at all. Sometimes it feels like a cough (either dry or with mucus), choking sensation, asthma, shortness of breath,  waking up at night, frequent throat clearing, or trouble swallowing.    What causes GERD? The reason some people develop GERD is still unclear. However, research shows that in people with GERD, the LES relaxes while the rest of the esophagus is working. Anatomical abnormalities such as a hiatal hernia may also contribute to GERD. A hiatal hernia occurs when the upper part of the stomach and the LES move above the diaphragm, the muscle wall that separates the stomach from the chest. Normally, the diaphragm helps the LES keep acid from rising up into the esophagus. When a hiatal hernia is present, acid reflux can occur more easily. A hiatal hernia can occur in people of any age and is most often a normal finding in otherwise healthy people over age 79. Most of the time, a hiatal hernia produces no symptoms.   Other factors that may contribute to GERD include - Obesity or recent weight gain - Pregnancy  - Smoking  - Diet - Certain medications  Common foods that can worsen reflux symptoms include: - carbonated beverages - artificial sweeteners - citrus fruits  - chocolate  - drinks with caffeine or alcohol  - fatty and fried foods  - garlic and onions  - mint flavorings  - spicy foods  - tomato-based foods, like spaghetti sauce, salsa, chili, and pizza   Lifestyle Changes If you smoke, stop.  Avoid foods and beverages that worsen symptoms (see above.) Lose weight if needed.  Eat small, frequent meals.  Wear loose-fitting clothes.  Avoid lying down for 3 hours after a  meal.  Raise the head of your bed 6 to 8 inches by securing wood blocks under the bedposts. Just using extra pillows will not help, but using a wedge-shaped pillow may be helpful.  Medications  H2 blockers, such as cimetidine (Tagamet HB), famotidine (Pepcid AC), nizatidine (Axid AR), and ranitidine (Zantac 75), decrease acid production. They are available in prescription strength and over-the-counter strength. These  drugs provide short-term relief and are effective for about half of those who have GERD symptoms.  Proton pump inhibitors include omeprazole (Prilosec, Zegerid), lansoprazole (Prevacid), pantoprazole (Protonix), rabeprazole (Aciphex), and esomeprazole (Nexium), which are available by prescription. Prilosec is also available in over-the-counter strength. Proton pump inhibitors are more effective than H2 blockers and can relieve symptoms and heal the esophageal lining in almost everyone who has GERD.  Because drugs work in different ways, combinations of medications may help control symptoms. People who get heartburn after eating may take both antacids and H2 blockers. The antacids work first to neutralize the acid in the stomach, and then the H2 blockers act on acid production. By the time the antacid stops working, the H2 blocker will have stopped acid production. Your health care provider is the best source of information about how to use medications for GERD.   Points to Remember 1. You can have GERD without having heartburn. Your symptoms could include a dry cough, asthma symptoms, or trouble swallowing.  2. Taking medications daily as prescribed is important in controlling you symptoms.  Sometimes it can take up to 8 weeks to fully achieve the effects of the medications prescribed.  3. Coughing related to GERD can be difficult to treat and is very frustrating!  However, it is important to stick with these medications and lifestyle modifications before pursuing more aggressive or invasive test and treatments.

## 2022-04-21 NOTE — Progress Notes (Signed)
f °

## 2022-05-07 DIAGNOSIS — Z8551 Personal history of malignant neoplasm of bladder: Secondary | ICD-10-CM | POA: Diagnosis not present

## 2022-05-08 ENCOUNTER — Telehealth: Payer: Self-pay | Admitting: Pharmacist

## 2022-05-08 NOTE — Progress Notes (Signed)
Pre filled Ozempic Renewal Application for patient for year 2024, to be given to her at upcoming appointment with PCP.  Patient Assistance: [employer] - 2024 in process  [place] Pharmacist Assistant [phone]

## 2022-05-13 DIAGNOSIS — J439 Emphysema, unspecified: Secondary | ICD-10-CM | POA: Insufficient documentation

## 2022-05-13 NOTE — Patient Instructions (Incomplete)
HEALTH MAINTENANCE RECOMMENDATIONS:  It is recommended that you get at least 30 minutes of aerobic exercise at least 5 days/week (for weight loss, you may need as much as 60-90 minutes). This can be any activity that gets your heart rate up. This can be divided in 10-15 minute intervals if needed, but try and build up your endurance at least once a week.  Weight bearing exercise is also recommended twice weekly.  Eat a healthy diet with lots of vegetables, fruits and fiber.  "Colorful" foods have a lot of vitamins (ie green vegetables, tomatoes, red peppers, etc).  Limit sweet tea, regular sodas and alcoholic beverages, all of which has a lot of calories and sugar.  Up to 1 alcoholic drink daily may be beneficial for women (unless trying to lose weight, watch sugars).  Drink a lot of water.  Calcium recommendations are 1200-1500 mg daily (1500 mg for postmenopausal women or women without ovaries), and vitamin D 1000 IU daily.  This should be obtained from diet and/or supplements (vitamins), and calcium should not be taken all at once, but in divided doses.  Monthly self breast exams and yearly mammograms for women over the age of 67 is recommended.  Sunscreen of at least SPF 30 should be used on all sun-exposed parts of the skin when outside between the hours of 10 am and 4 pm (not just when at beach or pool, but even with exercise, golf, tennis, and yard work!)  Use a sunscreen that says "broad spectrum" so it covers both UVA and UVB rays, and make sure to reapply every 1-2 hours.  Remember to change the batteries in your smoke detectors when changing your clock times in the spring and fall. Carbon monoxide detectors are recommended for your home.  Use your seat belt every time you are in a car, and please drive safely and not be distracted with cell phones and texting while driving.   Shannon Schultz , Thank you for taking time to come for your Medicare Wellness Visit. I appreciate your ongoing  commitment to your health goals. Please review the following plan we discussed and let me know if I can assist you in the future.   This is a list of the screening recommended for you and due dates:  Health Maintenance  Topic Date Due   Medicare Annual Wellness Visit  03/11/2018   COVID-19 Vaccine (6 - Pfizer risk series) 07/03/2021   Complete foot exam   05/08/2022   Hemoglobin A1C  06/10/2022   Eye exam for diabetics  07/05/2022   Yearly kidney health urinalysis for diabetes  12/10/2022   Colon Cancer Screening  01/23/2023   Screening for Lung Cancer  03/24/2023   Yearly kidney function blood test for diabetes  03/26/2023   Tetanus Vaccine  06/15/2028   Pneumonia Vaccine  Completed   Flu Shot  Completed   Hepatitis C Screening: USPSTF Recommendation to screen - Ages 18-79 yo.  Completed   Zoster (Shingles) Vaccine  Completed   HPV Vaccine  Aged Out   RSV vaccine is recommended.  You need to get this from the pharmacy. Consider getting the updated COVID booster.  We sent in a prescription for a new nasal spray (2 sprays into each nostril twice daily). Hopefully this helps with the runny nose, and postnasal drip which may be contributing to your cough (the residual cough that wasn't helped by taking prilosec).  Your neck pain seems muscular.  Try moist heat, stretches and massage.

## 2022-05-13 NOTE — Progress Notes (Signed)
Chief Complaint  Patient presents with   Medicare Wellness    Nonfasting AWV/CPE with pelvic. Right side of her neck is very sore and edge of her neck x 3 days. Does not want covid booster. Also started prilosec for ongoing cough-does not seem to be working.     Shannon Schultz is a 76 y.o. female who presents for annual physical exam, Medicare wellness visit and follow-up on chronic medical conditions.  See below for labs done prior to her visit.  She has the following concerns: R neck pain x 3 days. She is wondering if it is from the nasal cannula, but seems to be extending lower in the neck, and hurts to move her neck.  COPD: She was hospitalized in September with COPD exacerbation. She was discharged on home oxygen.  She had some continued congestion.  She followed up with pulmonary, who treated her with prednisone and antibiotics, but still had ongoing cough.  At visit in October, pulmonary felt that GERD likely contributing to cough.  She was advised to continue Trelegy, albuterol prn, home oxygen, and to start daily PPI. She is on 52m omeprazole. Cough is a little better, but still has some cough at night. She has a lot of runny nose during the day.  Doesn't cough much during the day. She is to follow-up with Dr. ELoanne Drillingin January.   H/o bilateral pulmonary embolism. She is under care of hematology, who recommends lifelong anticoagulation. She last saw them 9/20, and after 6 mos of 518mBID eliquis, dose was reduced to 2.95m9mID.  She had been refilled #90 of the 95mg57mse. She is taking 1/2 tablet twice daily.  History of breast cancer.  10/2021 met with genetics counselor, denied proceeding with testing. Mammogram is UTD, denies breast concerns.  Anxiety:  at her 7/20 visit her GAD-7 score was 13. She had reported always being anxious, and that her eczema flares over the last year have been triggered by her anxiety. She preferred to try treating anxiety with medication (over taking eczema  medications she sees on commercials). She reported sometimes feeling overwhelmed/anxious regarding her health issues, including diagnosis of dry macular degeneration, with some worsening vision (things appear dimmer). She was started on lexapro (and given some alprazolam to use sparingly, which she hasn't needed).  She noted significant improvement in her anxiety, with only mild, intermittent nausea. GAD-7 score at follow-up visit 02/26/22 was 1 (occasional irritability). Today she reports that her anxiety remains well controlled. Eczema is flaring.   Eczema:  This had improved some after starting treatment for anxiety. Eczema is currently flaring on her low back and L>R elbows, back of hands.  She is under the care of dermatologist.  Diabetes: At her physical in 04/2021 she was started on Ozempic--to improve control of sugars, and also help with weight loss.  Metformin dose was decreased to 500mg42me daily. She is tolerating 0.95mg w66mout side effects.  We had discussed the potential to increase Ozempic (and potentially stop metformin), to further help with weight loss. She declined change at that time, wanting to hold off until after vein surgery.  Last A1c was 6.3% in June 2023.   Blood sugars are running 120's-130's Denies hypoglycemia, polydipsia and polyuria.  Last yearly eye exam was in 06/2021. Patient checks feet regularly without concerns.  Denies numbness, tingling (other than intermittent numbness in the 2nd and 3rd toes on the right foot, related to h/o fractures, didn't heal right, per pt). No skin  lesions.   Hypertension: Reports compliance with bisoprolol HCT, and denies side effects. Denies headaches, chest pain or dizziness.  She has a slight headache today, but doesn't usually. BP's elsewhere 120's/60's-70's.  BP Readings from Last 3 Encounters:  05/14/22 124/72  04/21/22 (!) 140/74  04/08/22 132/68   Venous insufficiency and edema:   She had laser ablation on the RLE in July  (was told she didn't need surgery on the left). She continues to wear compression socks on both legs, most days. She no longer takes lasix daily, just as needed.   She denies any skin lesions/wounds.   H/o aortic stenosis--she underwent TAVR 07/2020.  She was prescribed ABX for SBE prophylaxis by cardiologist. She denies syncope, angina, dyspnea.  She was noted to have mild nonobstructive CAD on cardiac cath.  Last echo was 08/2021, last visit with cardiology 02/2022.    Hyperlipidemia follow-up: Patient is reportedly following a low-fat, low cholesterol diet. Compliant with simvastatin and denies medication side effects.  She has known aortic atherosclerosis.  She also has known 3cm AAA.  Recommendation is for [place] f/u in 10/2022 (based on CT 10/2019). Lipids were at goal on last check: Lab Results  Component Value Date   CHOL 118 12/09/2021   HDL 54 12/09/2021   LDLCALC 49 12/09/2021   TRIG 70 12/09/2021   CHOLHDL 2.2 12/09/2021    Cirrhosis:  CT 10/2019 revealed hepatic cirrhosis and findings of portal venous hypertension. No evidence of ascites. Dr. Collene Mares is her GI. She has had normal PT/INR, albumin, LFT's. Lab Results  Component Value Date   ALT 13 03/19/2022   AST 24 03/19/2022   ALKPHOS 55 03/19/2022   BILITOT 0.7 03/19/2022   H/o bladder cancer--lesion in bladder was noted incidentally on CT 11/2016 prior to hernia surgery. Pathology showed low grade papillary urothelial CA, and she was treated with intravesicular chemo.  She recently had yearly cystoscopy, which was clear.  She takes Valtrex preventatively for HSV flare on hip.  At one point, after she got her shingles vaccine, her flares decreased, and she changed to prn dosing.  She ultimately restarted taking it daily because she had outbreaks recur regularly.   Immunization History  Administered Date(s) Administered   Fluad Quad(high Dose 65+) 04/07/2019, 04/26/2020, 05/08/2021, 03/25/2022   Influenza Split 05/28/2011    Influenza, High Dose Seasonal PF 04/07/2013, 05/15/2014, 05/30/2015, 03/25/2016, 03/05/2017, 04/01/2018   Influenza, Seasonal, Injecte, Preservative Fre 06/07/2012   PFIZER Comirnaty(Gray Top)Covid-19 Tri-Sucrose Vaccine 10/25/2020   PFIZER(Purple Top)SARS-COV-2 Vaccination 08/20/2019, 09/13/2019, 04/14/2020   Pfizer Covid-19 Vaccine Bivalent Booster 82yr & up 05/08/2021   Pneumococcal Conjugate-13 07/06/2014   Pneumococcal Polysaccharide-23 12/15/2004, 05/28/2011, 05/27/2021   Tdap 02/23/2008, 06/15/2018   Zoster Recombinat (Shingrix) 12/02/2017, 02/06/2018   Zoster, Live 05/17/2010   Last Pap smear:  02/2017--normal, no high risk HPV detected Last mammogram: 11/2021 Last colonoscopy: 12/2015 with Dr. Mann--diverticulosis and internal hemorrhoids.  F/U recommend 7 years (12/2022) Last DEXA: 09/2021 T-2.0 at L forearm radius.  -1.8 at L fem neck Dentist: twice a year Ophtho: yearly (at least) Exercise: She walks 10-15 minutes/day around her house.  Has dumbbells at home, not using them.    Patient Care Team: KRita Ohara MD as PCP - General (Family Medicine) CSanda Klein MD as PCP - Cardiology (Cardiology) KRita Ohara MD as Referring Physician (Family Medicine) PViona Gilmore [facility] Centeras Pharmacist (Pharmacist) Dermatologist--Dr. CBridgett Larsson([place] Dentist--Dr. BFerne ReusEye GI--Dr. MCollene MaresHeme-onc: TLincoln Brigham PA-C (previously saw Dr. GLindi Adie  Surgeon: Dr. Marlou Starks Urologist: Dr. Alinda Money Pulmonary: Dr. [name] Ortho: Dr. Rhona Raider in the past, now has seen Dr. Lyla Glassing (hip). Dr. Stann Mainland (shoulder) ENT: Dr. Lucia Gaskins (retired)  Depression Screening: [place] [facility] Visit from [facility]/14/[facility]22 in [facility]  PHQ-2 Total Score [place] Office Visit from 05/14/2022 in [place]  PHQ-9 Total Score 0         05/14/2022    1:45 PM 02/26/2022    3:49 PM 01/16/2022    3:51 PM  GAD 7 : Generalized Anxiety Score   Nervous, Anxious, on Edge 0 0 1  Control/stop worrying 2 0 3  Worry too much - different things 0 0 3  Trouble relaxing 0 0 2  Restless 0 0 3  Easily annoyed or irritable 0 1 1  Afraid - awful might happen 0 0 0  Total GAD 7 Score _0 Anxiety Difficulty Not difficult at all Not difficult at all Not difficult at all    Falls screen:     05/14/2022    1:44 PM 02/26/2022    3:44 PM 11/14/2021   10:05 AM 05/08/2021   10:07 AM 10/25/2020   11:39 AM  [place] in the past year? 0 0 0 0 0  Number falls in past yr: 0 0 0 0 0  Injury with Fall? 0 0 0 0 0  Risk for fall due to : _1   Follow up _2      Functional Status Survey: Is the patient deaf or have difficulty hearing?: No Does the patient have difficulty seeing, even when wearing glasses/contacts?: Yes (macular degeneration) Does the patient have difficulty concentrating, remembering, or making decisions?: Yes (age related) Does the patient have difficulty walking or climbing stairs?: Yes Does the patient have difficulty dressing or bathing?: No Does the patient have difficulty doing errands alone such as visiting a doctor's office or shopping?: No  Mini-Cog Scoring: 5    End of Life Discussion:  Patient has a living will and medical power of attorney, scanned in chart.   PMH, PSH, SH and FH were reviewed and updated.  Allergies  Allergen Reactions   Contrast Media [Iodinated Contrast Media] Anaphylaxis, Shortness Of Breath and Other (See Comments)    Could not breath   Iohexol Anaphylaxis, Shortness Of Breath and Other (See Comments)    Immediately could not breathe   Lisinopril Anaphylaxis, Shortness Of Breath and Rash   Sulfa Antibiotics Anaphylaxis and Other (See Comments)    Historical from mother, pt states that  mother says she almost died from this drug   Latex Other (See Comments)    Unless against on skin for a long time, blisters. Short term is okay.   Codeine Nausea Only, Anxiety and Other (See Comments)    insomnia   Levofloxacin Other (See Comments)    insomnia   Lipitor [Atorvastatin Calcium] Rash   Meloxicam Rash    Broke out in a rash on her stomach,back, legs, and behind ears.   Nickel Other (See Comments)    With earrings pt has soreness and drainage from piercing   Vicodin [Hydrocodone-Acetaminophen] Itching    Allergies  Allergen Reactions   Contrast Media [Iodinated Contrast Media] Anaphylaxis, Shortness Of Breath and Other (See Comments)  Could not breath   Iohexol Anaphylaxis, Shortness Of Breath and Other (See Comments)    Immediately could not breathe   Lisinopril Anaphylaxis, Shortness Of Breath and Rash   Sulfa Antibiotics Anaphylaxis and Other (See Comments)    Historical from mother, pt states that mother says she almost died from this drug   Latex Other (See Comments)    Unless against on skin for a long time, blisters. Short term is okay.   Codeine Nausea Only, Anxiety and Other (See Comments)    insomnia   Levofloxacin Other (See Comments)    insomnia   Lipitor [Atorvastatin Calcium] Rash   Meloxicam Rash    Broke out in a rash on her stomach,back, legs, and behind ears.   Nickel Other (See Comments)    With earrings pt has soreness and drainage from piercing   Vicodin [Hydrocodone-Acetaminophen] Itching    ROS: The patient denies anorexia, fever, decreased hearing, ear pain, sore throat, breast concerns, chest pain, palpitations, dizziness, syncope, cough, swelling, nausea, vomiting, constipation, diarrhea, abdominal pain, melena, hematochezia, indigestion/heartburn, hematuria, incontinence, dysuria, vaginal bleeding, discharge, odor or itch, genital lesions, numbness, tingling, weakness, tremor, suspicious skin lesions, abnormal bleeding, or enlarged lymph  nodes.  Bilateral hip pain only when she lays on her side (bursitis). No hip pain with walking Allergies--has some chronic runny nose and intermittent ear popping. Denies nosebleeds (only when using nasal steroids). Some hand pain from arthritis, mostly in the thumbs Some tingling in right 2nd and 3rd toes, chronic/stable. R shoulder with limited ROM, tolerable. Only hurts at end of ROM Skin itching from eczema (back, arm).  Eczema is flaring per HPI Vision unchanged from last year (some issues related to macular degeneration). Breathing is stable. Much better than at discharge from hospital.  Doesn't need to use home oxygen as much as she used to. Moods are good, some chronic anxiety.    PHYSICAL EXAM:  BP 124/72   Pulse 80   Temp (!) 97 F (36.1 C) (Tympanic)   Ht 5' 2.5" (1.588 m)   Wt 226 lb 9.6 oz (102.8 kg)   LMP  (LMP Unknown)   BMI 40.79 kg/m   Wt Readings from Last 3 Encounters:  05/14/22 226 lb 9.6 oz (102.8 kg)  04/21/22 237 lb (107.5 kg)  04/08/22 230 lb 6.4 oz (104.5 kg)    General Appearance:   Alert, cooperative, no distress, appears stated age. Not wearing oxygen during visit (brought it with her though)  Head:   Normocephalic, without obvious abnormality, atraumatic    Eyes:   PERRL, conjunctiva/corneas clear, EOM's intact, fundi benign.   Ears:   Normal TM's and external ear canals    Nose:   Slight crusting noted at R nares; no sinus tenderness  Throat:   Normal mucosa  Neck:   Supple, no lymphadenopathy; thyroid: no enlargement/tenderness/ nodules; no carotid bruit or JVD. She has FROM of neck, but some pain at R lateral neck with certain movements, slightly tender at R SCM   Back:   Spine nontender, no curvature, ROM normal, no CVA tenderness    Lungs:   Clear to auscultation bilaterally without wheezes, rales or ronchi; respirations unlabored. Breath sounds are distant   Chest Wall:   No tenderness or mass. Absence of R breast, WHSS. Also WHSS L upper  chest  Heart:   Regular rate and rhythm, S1 and S2 normal, no rub or gallop. Murmur persists, softer than prior to surgery; loudest at RUSB and radiates to  carotids  Breast Exam:   No tenderness, masses, or nipple discharge or inversion of L breast. WHSS along superior aspect of areola and left upper chest. No axillary lymphadenopathy. Right breast is absent.    Abdomen:   Soft, non-tender, nondistended, normoactive bowel sounds, no masses, no hepatosplenomegaly. + abdominal obesity. WHSS.   Genitalia:   Normal external genitalia without lesions, mild atrophic changes. BUS and vagina normal; No cervical motion tenderness. No abnormal vaginal discharge. Uterus and adnexa not enlarged, nontender, no masses, although exam is limited by body habitus. Pap not performed    Rectal:   Normal sphincter tone, no mass. Heme negative stool  Extremities:   Trace edema R>L. Normal diabetic foot exam.   Pulses:   2+ and symmetric all extremities    Skin:   Skin turgor normal. Dry skin at lower back, legs, arms, flaky on feet. Purpura noted on R forearm. Venous stasis changes/hyperpigmentation bilateral lower legs, R>L. There is prominent superficial veins in feet giving bluish discoloration, L>R. Prominent vein right shin.  Normal sensation to monofilament. Large patch/plaque of dry, hyperkeratotic skin at L elbow (posterior, not at antecub fossa), small dry patch at dorsum L wrist. Low back appears fairly normal, just dry.  Lymph nodes:  Cervical, supraclavicular, inguinal and axillary nodes normal    Neurologic:   Normal strength, sensation and gait; reflexes 2+ and symmetric throughout                  Psych:  Normal mood, affect, hygiene and grooming    Lab Results  Component Value Date   HGBA1C 5.2 05/14/2022    ASSESSMENT/PLAN:  Annual physical exam  Medicare annual wellness visit, subsequent  Type 2 diabetes mellitus with complications ([place]) - well controlled. Cont ozempic and metformin (likely  could stop, but pt states she has plenty at home, prefers to continue) - Plan: HgB A1c  Hypertension associated with diabetes ([place]) - controlled  Hyperlipidemia associated with type 2 diabetes mellitus ([place]) - cont statin; at goal per last check  Cirrhosis of liver without ascites, unspecified hepatic cirrhosis type ([place]) - under care of GI. No e/o decreased function. Likely related to fatty liver--encouraged further wt loss  Aortic atherosclerosis (HCC) - cont statin  Senile purpura (HCC)  Eczema, unspecified type - pt under care of derm, no notes received. Location more c/w psoriasis than eczema. Skin is very dry--recs reviewed  Generalized anxiety disorder - controlled, cont SSRI  Chronic hypoxemic respiratory failure (HCC) - stable/improved. Under care of pulm.  Requiring less daytime oxygen over the last few weeks  COPD with chronic bronchitis and emphysema ([place]) - cont current meds and oxygen per pulm  Thrombocytopenia (HCC)  Allergic rhinitis, unspecified seasonality, unspecified trigger - suspect PND is contributing to her cough; didn't tolerate nasal steroids due to nosebleeds; will try astelin - Plan: azelastine (ASTELIN) 0.1 % nasal spray  Gastroesophageal reflux disease without esophagitis - suspected to be contributing to chronic cough. Some improvement in cough since adding PPI.  Now need to address PND/allergies  Neck pain on right side - muscular; shown stretches, rec heat, massage. f/u if persists/worsens  HSV (herpes simplex virus) infection - cont daily valtrex for prevention  Osteopenia of forearm, unspecified laterality - counseled re: calcium, vitamin D and weight-bearing exercise  Severe obesity (BMI >= 40) (HCC) - counseled re: diet, exercise, wt loss  No refills needed per pt (recently got ozempic and still has plenty of simva, per pt)  Had CBC and  c-met in September (through cancer center), plus hospital labs. Normal lipids and urine microalb in  June.   Discussed monthly self breast exams and yearly mammograms; at least 30 minutes of aerobic activity at least 5 days/week and weight-bearing exercise 2x/week; proper sunscreen use reviewed; healthy diet, including goals of calcium and vitamin D intake and alcohol recommendations (less than or equal to 1 drink/day) reviewed; regular seatbelt use; changing batteries in smoke detectors.  Immunization recommendations discussed--continue high dose flu shots yearly. Bivalent COVID booster recommended, declined.  Recommended RSV vaccine from pharmacy. Colonoscopy recommendations reviewed, UTD--f/u with Dr. Collene Mares due 12/2022.  Full Code, Full Care MOST form reviewed and updated  Total FTF time 65 min, with at least 20 add'l min in chart prep/review, documentation, with significant portion of this related to NON-wellness diagnoses.  Medicare Attestation I have personally reviewed: The patient's medical and social history Their use of alcohol, tobacco or illicit drugs Their current medications and supplements The patient's functional ability including ADLs,fall risks, home safety risks, cognitive, and hearing and visual impairment Diet and physical activities Evidence for depression or mood disorders  The patient's weight, height, BMI, and visual acuity have been recorded in the chart.  I have made referrals, counseling, and provided education to the patient based on review of the above and I have provided the patient with a written personalized care plan for preventive services.

## 2022-05-14 ENCOUNTER — Encounter: Payer: Self-pay | Admitting: Family Medicine

## 2022-05-14 ENCOUNTER — Ambulatory Visit (INDEPENDENT_AMBULATORY_CARE_PROVIDER_SITE_OTHER): Payer: Medicare HMO | Admitting: Family Medicine

## 2022-05-14 VITALS — BP 124/72 | HR 80 | Temp 97.0°F | Ht 62.5 in | Wt 226.6 lb

## 2022-05-14 DIAGNOSIS — J309 Allergic rhinitis, unspecified: Secondary | ICD-10-CM

## 2022-05-14 DIAGNOSIS — K7460 Unspecified cirrhosis of liver: Secondary | ICD-10-CM | POA: Diagnosis not present

## 2022-05-14 DIAGNOSIS — J9611 Chronic respiratory failure with hypoxia: Secondary | ICD-10-CM

## 2022-05-14 DIAGNOSIS — I700 Atherosclerosis of aorta: Secondary | ICD-10-CM

## 2022-05-14 DIAGNOSIS — F411 Generalized anxiety disorder: Secondary | ICD-10-CM

## 2022-05-14 DIAGNOSIS — E785 Hyperlipidemia, unspecified: Secondary | ICD-10-CM

## 2022-05-14 DIAGNOSIS — D692 Other nonthrombocytopenic purpura: Secondary | ICD-10-CM | POA: Diagnosis not present

## 2022-05-14 DIAGNOSIS — J439 Emphysema, unspecified: Secondary | ICD-10-CM

## 2022-05-14 DIAGNOSIS — Z0000 Encounter for general adult medical examination without abnormal findings: Secondary | ICD-10-CM | POA: Diagnosis not present

## 2022-05-14 DIAGNOSIS — I152 Hypertension secondary to endocrine disorders: Secondary | ICD-10-CM

## 2022-05-14 DIAGNOSIS — L309 Dermatitis, unspecified: Secondary | ICD-10-CM

## 2022-05-14 DIAGNOSIS — B009 Herpesviral infection, unspecified: Secondary | ICD-10-CM

## 2022-05-14 DIAGNOSIS — J4489 Other specified chronic obstructive pulmonary disease: Secondary | ICD-10-CM

## 2022-05-14 DIAGNOSIS — E118 Type 2 diabetes mellitus with unspecified complications: Secondary | ICD-10-CM

## 2022-05-14 DIAGNOSIS — D696 Thrombocytopenia, unspecified: Secondary | ICD-10-CM

## 2022-05-14 DIAGNOSIS — K219 Gastro-esophageal reflux disease without esophagitis: Secondary | ICD-10-CM

## 2022-05-14 DIAGNOSIS — M542 Cervicalgia: Secondary | ICD-10-CM

## 2022-05-14 DIAGNOSIS — E1159 Type 2 diabetes mellitus with other circulatory complications: Secondary | ICD-10-CM | POA: Diagnosis not present

## 2022-05-14 DIAGNOSIS — E1169 Type 2 diabetes mellitus with other specified complication: Secondary | ICD-10-CM | POA: Diagnosis not present

## 2022-05-14 DIAGNOSIS — M85839 Other specified disorders of bone density and structure, unspecified forearm: Secondary | ICD-10-CM

## 2022-05-14 LAB — POCT GLYCOSYLATED HEMOGLOBIN (HGB A1C): Hemoglobin A1C: 5.2 % (ref 4.0–5.6)

## 2022-05-14 MED ORDER — AZELASTINE HCL 0.1 % NA SOLN: 2.0000 | Freq: Two times a day (BID) | NASAL | 2 refills | Status: DC

## 2022-05-29 ENCOUNTER — Ambulatory Visit: Payer: Medicare HMO | Admitting: Cardiovascular Disease

## 2022-06-17 ENCOUNTER — Telehealth: Payer: Self-pay | Admitting: Family Medicine

## 2022-06-17 NOTE — Telephone Encounter (Signed)
Advise pt that flexeril can be very sedating.  I don't know what dose her husband has. I recommend '5mg'$  (she can cut the '10mg'$  tablet in half if that is what he has), every 8 hours as needed, and not to drive while taking. She can't take ibuprofen-type meds (NSAIDs) due to being on blood thinners. Heat, stretching and massage should also help.

## 2022-06-17 NOTE — Telephone Encounter (Signed)
Pt called and states "she does not need an appt', she states that she has pulled a muscle in her back and wants to know if is ok to take flexeril. I asked if she had flexeril at home and she advised it was her husbands. Pt was offered an appt twice and she declines stating she wanted a message sent back to Dr. Tomi Bamberger. Pt was advised that Dr. Tomi Bamberger is out of the office today. She still wanted a message to be sent.

## 2022-06-18 NOTE — Telephone Encounter (Signed)
Call pt and advised

## 2022-06-30 ENCOUNTER — Telehealth: Payer: Self-pay | Admitting: Family Medicine

## 2022-06-30 NOTE — Telephone Encounter (Signed)
PT ASSISTANCE OZEMPIC RENEWAL 2024 completed & approved

## 2022-07-01 ENCOUNTER — Other Ambulatory Visit: Payer: Self-pay | Admitting: Family Medicine

## 2022-07-01 DIAGNOSIS — E7800 Pure hypercholesterolemia, unspecified: Secondary | ICD-10-CM

## 2022-07-10 NOTE — Telephone Encounter (Signed)
Pt Assistance 2024 approved

## 2022-07-11 ENCOUNTER — Telehealth: Payer: Self-pay | Admitting: Pulmonary Disease

## 2022-07-11 MED ORDER — TRELEGY ELLIPTA 100-62.5-25 MCG/ACT IN AEPB: 1.0000 | INHALATION_SPRAY | Freq: Every day | RESPIRATORY_TRACT | 12 refills | Status: DC

## 2022-07-11 NOTE — Telephone Encounter (Signed)
Called and spoke to patient and she is needing refills on her trelegy. Sent refills in. Nothing further needed

## 2022-07-11 NOTE — Telephone Encounter (Signed)
Trelegy refill needed to be called in please.   Pharm: [facility] [phone] Cell

## 2022-07-16 ENCOUNTER — Ambulatory Visit (INDEPENDENT_AMBULATORY_CARE_PROVIDER_SITE_OTHER): Payer: Medicare HMO | Admitting: Pulmonary Disease

## 2022-07-16 ENCOUNTER — Encounter (HOSPITAL_BASED_OUTPATIENT_CLINIC_OR_DEPARTMENT_OTHER): Payer: Self-pay | Admitting: Pulmonary Disease

## 2022-07-16 VITALS — BP 142/60 | HR 73 | Ht 62.5 in | Wt 225.2 lb

## 2022-07-16 DIAGNOSIS — G4734 Idiopathic sleep related nonobstructive alveolar hypoventilation: Secondary | ICD-10-CM | POA: Diagnosis not present

## 2022-07-16 DIAGNOSIS — J449 Chronic obstructive pulmonary disease, unspecified: Secondary | ICD-10-CM

## 2022-07-16 NOTE — Patient Instructions (Addendum)
Moderate COPD GOLD B - last exacerbation 02/2022 --CONTINUE Trelegy ONE puff ONCE a day --CONTINUE Albuterol AS NEEDED for shortness of breath or wheezing  Nocturnal hypoxemic respiratory failure secondary to mild OSA  Discussed CPAP vs oral appliance vs weight loss. Prefers to continue oxygen.  --CONTINUE 2L oxygen via nasal cannula nightly  --Will order ONO at next visit   Follow-up with me in 6 months

## 2022-07-16 NOTE — Progress Notes (Signed)
Subjective:   PATIENT ID: Shannon Schultz GENDER: female DOB: 1945-12-09, MRN: [id number]   HPI  Chief Complaint  Patient presents with   Follow-up    No updates   Shannon Schultz is a 77 year old female with allergic rhinitis moderate COPD, HTN, DM2, AS s/p TAVR 07/2020 who presents for follow-up.  Synopsis: Diagnosis of COPD well-controlled on bronchodilators. Exacerbations annually including hospitalization 02/2022. Stepped up to Trelegy in 2023.  09/02/2021 Since her last visit she has been compliant with her Anoro and Singulair daily.  She was treated with azithromycin for COPD flare/acute bronchitis in December 2022.  Her last COPD exacerbation for this was 2021. She currently reports mild cough with nasal congestion. Triggered [place] pollen and [place] She is on zyrtec and Singulair. Otherwise she reports her symptoms are well-controlled. Denies wheezing. No limitations in activity. Compliant with oxygen at night however would want to discontinue this if she no longer needs it.  09/30/21 Since our last visit, she was diagnosed with PE. On the day after her CT chest lung screen, she reports abrupt onset of left sided chest pain. No fevers or cough preceding this. Was seen by PCP and treated with antibiotics. She then developed hemoptysis. After seeing Pulmonary NP CTA was ordered and multiple PE diagnosed however study was suboptimal. Started on Eliquis. Since then she reports improved left chest pain, resolved hemoptysis and improved cough though does complain of upper airway congestion/cough.  01/08/22 She reports she has not been on Eliquis for a month. She is scheduled for surgery with Dr. Donzetta Matters tomorrow for laser ablation. Denies shortness of breath, chest pain, cough, hemoptysis, shortness of breath or wheezing.  07/16/22 Since our last visit she has been treated for COPD exacerbation including hospitalization in September. Daytime oxygenation has improved. Wearing  nighttime. She is on Trelegy 100. Able to perform housework and able to shop at [facility]. Cough has resolved. Denies significant shortness of breath, cough or wheezing. Reflux has improved.  Social History: 46 pack years. Quit in 2012. During 2018, she lost her sister to breast cancer in early 2018, was diagnosed with breast cancer s/p lumpectomy, had bladder cancer and underwent resection and intravesicular chemo, fell and broke her right shoulder, underwent ventral hernia repair with mesh insertion.   Patient Active Problem List   Diagnosis Date Noted   COPD with chronic bronchitis and emphysema ([place]) 05/13/2022   COPD exacerbation ([place]) 03/24/2022   Acute on chronic respiratory failure with hypoxia ([place]) [birth year]76[birth year]1950   Nonalcoholic steatohepatitis ([place]) 03/23/2022   Personal history of colonic polyps 03/23/2022   Diverticulosis of colon 03/23/2022   History of pulmonary embolus (PE) 03/19/2022   Personal history of breast cancer 11/15/2021   Pulmonary embolism ([place]) 09/14/2021   Hemoptysis 09/13/2021   Pleural effusion 93/26/7124   Diastolic dysfunction 58/02/9832   URI (upper respiratory infection) 09/13/2021   Nocturnal hypoxemia 11/12/2020   S/P TAVR (transcatheter aortic valve replacement) 08/14/2020   Severe aortic stenosis    Contrast media allergy 06/26/2020   AAA (abdominal aortic aneurysm) without rupture 06/26/2020   Upper airway cough syndrome 12/20/2019   Hip fracture ([place]) 08/25/2019   Venous insufficiency 09/10/2018   Sinusitis 06/25/2018   Ophthalmic migraine 03/16/2018   COPD, group B, by GOLD 2017 classification ([place]) 09/30/2017   Chronic hypoxemic respiratory failure ([place]) 09/29/2017   Ventral hernia without obstruction or gangrene 05/15/2017   Cirrhosis of liver without ascites ([place]) 03/11/2017   Urothelial cancer (  [place]) 03/10/2017   Atherosclerosis of coronary artery 09/14/2016   Aortic atherosclerosis ([place]) 09/14/2016   Class 2 severe obesity due to excess  calories with serious comorbidity and body mass index (BMI) of 39.0 to 39.9 in adult ([place]) 11/10/2015   Thrombocytopenia ([place]) 08/17/2015   Elevated troponin 08/17/2015   Diabetes mellitus type 2 in obese ([place]) 08/17/2015   COPD with acute exacerbation ([place]) 08/16/2015   Severe obesity (BMI >= 40) ([place]) 10/06/2014   Family history BRCA2 gene positive    HSV (herpes simplex virus) infection 06/07/2012   CAP (community acquired pneumonia) 06/06/2011   Pure hypercholesterolemia 11/13/2010   Essential hypertension, benign 11/13/2010    Outpatient Medications Prior to Visit  Medication Sig Dispense Refill   acetaminophen (TYLENOL) 500 MG tablet Take 500-1,000 mg by mouth every 6 (six) hours as needed for moderate pain or headache.     albuterol (PROVENTIL) (2.5 MG/3ML) 0.083% nebulizer solution Take 3 mLs (2.5 mg total) by nebulization every 6 (six) hours as needed for wheezing or shortness of breath. 75 mL 12   albuterol (VENTOLIN HFA) 108 (90 Base) MCG/ACT inhaler Inhale 2 puffs into the lungs every 6 (six) hours as needed for wheezing or shortness of breath. 3 each 3   Alcohol Swabs (B-D SINGLE USE SWABS REGULAR) PADS Use twice a day when checking blood sugars 100 each 1   apixaban (ELIQUIS) 5 MG TABS tablet Take 1 tablet (5 mg total) by mouth 2 (two) times daily. 60 tablet 2   azelastine (ASTELIN) 0.1 % nasal spray Place 2 sprays into both nostrils 2 (two) times daily. Use in each nostril as directed 30 mL 2   bisoprolol-hydrochlorothiazide (ZIAC) 10-6.25 MG tablet Take 1 tablet by mouth daily. 90 tablet 1   cetirizine (ZYRTEC) 10 MG tablet Take 10 mg by mouth daily.     clobetasol ointment (TEMOVATE) 4.01 % 1 application  every 14 (fourteen) days.     escitalopram (LEXAPRO) 10 MG tablet Take 1 tablet (10 mg total) by mouth daily. Start at 1/2 tablet once daily; increase after 1 week if/when tolerated (Patient taking differently: Take 10 mg by mouth daily.) 90 tablet 3    Fluticasone-Umeclidin-Vilant (TRELEGY ELLIPTA) 100-62.5-25 MCG/ACT AEPB Inhale 1 puff into the lungs daily. 60 each 12   glucose blood test strip 1 each by Other route daily. Use as instructed 100 each 2   guaiFENesin (MUCINEX) 600 MG 12 hr tablet Take 600 mg by mouth at bedtime.     Lancets MISC 1 each by Does not apply route daily. 100 each 2   metFORMIN (GLUCOPHAGE-XR) 500 MG 24 hr tablet Take 500 mg by mouth daily with breakfast.     montelukast (SINGULAIR) 10 MG tablet Take 1 tablet (10 mg total) by mouth at bedtime. 90 tablet 3   omeprazole (PRILOSEC) 40 MG capsule Take 1 capsule (40 mg total) by mouth daily. 30 capsule 5   Semaglutide,0.25 or 0.'5MG'$ /DOS, (OZEMPIC, 0.25 OR 0.5 MG/DOSE,) 2 MG/1.5ML SOPN Inject 0.5 mg into the skin once a week. 6 mL 0   simvastatin (ZOCOR) 20 MG tablet TAKE 1 TABLET AT BEDTIME 90 tablet 1   valACYclovir (VALTREX) 500 MG tablet Take 1 tablet (500 mg total) by mouth daily. 90 tablet 3   No facility-administered medications prior to visit.    Review of Systems  Constitutional:  Negative for chills, diaphoresis, fever, malaise/fatigue and weight loss.  HENT:  Negative for congestion.   Respiratory:  Negative for cough, hemoptysis, sputum  production, shortness of breath and wheezing.   Cardiovascular:  Negative for chest pain, palpitations and leg swelling.    Objective:   Vitals:   07/16/22 1303  BP: (!) 142/60  Pulse: 73  SpO2: 93%  Weight: 225 lb 3.2 oz (102.2 kg)  Height: 5' 2.5" (1.588 m)   SpO2: 93 % O2 Device: None (Room air)   Body mass index is 40.53 kg/m.  Physical Exam: General: Well-appearing, no acute distress HENT: [place], AT Eyes: EOMI, no scleral icterus Respiratory: Diminished but clear to auscultation bilaterally.  No crackles, wheezing or rales Cardiovascular: RRR, -M/R/G, no JVD Extremities:-Edema,-tenderness Neuro: AAO x4, CNII-XII grossly intact Psych: Normal mood, normal affect  Data Reviewed:  Imaging Screening CT  chest 09/10/16 -moderate emphysema Screening CT 09/16/17-moderate emphysema, scattered subcentimeter pulmonary nodules.  Fatty liver with findings of cirrhosis. Screening CT 11/29/18- interval development of RUL nodule measuring 6.1 mm CT Chest 03/03/19 - slightly decreased RUL nodule measuring 5.3 mm Screening CT chest 03/26/20 - centrilobular emphysema. Scattered tiny pulmonary nodules, stable CTA 07/13/20 Stable pulmonary nodules VQ scan 04/02/2021-no pulmonary embolism CTA 09/13/2021-suboptimal contrast however radiology read concerning for filling defects in bilateral pulmonary arteries including segmental and right middle and lower lobe, lingula and left lower lobe.  Left lower lobe consolidation concerning for pulmonary infarct/pneumonia.  Background emphysema CTA PE 10/14/21 - Resolved segmental emboli. Resolved LLL consolidation with residual scarring. Moderate centrilobular emphysema. Cirrhosis. Splenomegaly 03/23/22 - No PE. Emphysema  PFTs 08/28/11 FVC 2.71 [92%], FEV1 1.33 (62%), F/F 49, TLC 114%, DLCO 46% Moderate obstructive defect with moderate reduction in diffusion capacity. No bronchodilator response   11/08/15 FVC 2.05 [7%), FEV1 1.23 (55%), F/F 60 Moderate obstructive defect   09/24/16 FVC 2. (82%), FEV1 1.43 (62%), F/F 58 , TLC 99%, DLCO 54% Moderate obstruction with moderate diffusion defect. No bronchodilator response.   Labs A1AT 08/08/16- 162, PIMM  Sleep study Home sleep study 05/03/19 - AHI 8.8 Nadir SpO2 76% Overnight oximetry 09/24/21 - SpO2 <88% for 44 minutes and 32 seconds. Nadir SpO2 80%. Recommend to continue wearing 2L O2.  Assessment & Plan:  77 year old female former smoker with COPD with emphysema, unprovoked PE, allergic rhinitis, OSA not on CPAP, AS s/p TAVR 20222 who presents for follow-up. Discussed clinical course and management of COPD including bronchodilator regimen and action plan for exacerbation.  Moderate COPD GOLD B - last exacerbation  02/2022 --CONTINUE Trelegy ONE puff ONCE a day. If symptoms stable 1 year, de-escalate --CONTINUE Albuterol AS NEEDED for shortness of breath or wheezing  Nocturnal hypoxemic respiratory failure secondary to mild OSA  Discussed CPAP vs oral appliance vs weight loss. Prefers to continue oxygen. No symptoms of fatigue, shortness of breath, headaches. Overall asymptomatic. --CONTINUE 2L oxygen via nasal cannula nightly  --Will order ONO at next visit   Acute pulmonary embolism, unprovoked --Hematology recommends lifelong anticoagulation on maintenance dosing  Allergic Rhinitis --CONTINUE Zyrtec --CONTINUE Singulair --STOP Flonase due to nose bleeds. Call office for atrovent nasal spray if congestion recurs   Health maintenance  Immunization History  Administered Date(s) Administered   Fluad Quad(high Dose 65+) 04/07/2019, 04/26/2020, 05/08/2021, 03/25/2022   Influenza Split 05/28/2011   Influenza, High Dose Seasonal PF 04/07/2013, 05/15/2014, 05/30/2015, 03/25/2016, 03/05/2017, 04/01/2018   Influenza, Seasonal, Injecte, Preservative Fre 06/07/2012   PFIZER Comirnaty(Gray Top)Covid-19 Tri-Sucrose Vaccine 10/25/2020   PFIZER(Purple Top)SARS-COV-2 Vaccination 08/20/2019, 09/13/2019, 04/14/2020   Pfizer Covid-19 Vaccine Bivalent Booster 32yr & up 05/08/2021   Pneumococcal Conjugate-13 07/06/2014   Pneumococcal Polysaccharide-23  12/15/2004, 05/28/2011, 05/27/2021   Tdap 02/23/2008, 06/15/2018   Zoster Recombinat (Shingrix) 12/02/2017, 02/06/2018   Zoster, Live 05/17/2010   Annual CT Lung -Last CTA 09/2021  No orders of the defined types were placed in this encounter.  No orders of the defined types were placed in this encounter.  Return in about 6 months (around 01/14/2023).  I have spent a total time of 30-minutes on the day of the appointment including chart review, data review, collecting history, coordinating care and discussing medical diagnosis and plan with the patient/family.  Past medical history, allergies, medications were reviewed. Pertinent imaging, labs and tests included in this note have been reviewed and interpreted independently by me.  Kalisi Bevill Rodman Pickle, MD [facility] 07/16/2022 1:30 PM

## 2022-07-28 NOTE — Telephone Encounter (Signed)
Ozempic received, called pt & informed

## 2022-08-01 ENCOUNTER — Other Ambulatory Visit: Payer: Self-pay | Admitting: Family Medicine

## 2022-08-01 DIAGNOSIS — I10 Essential (primary) hypertension: Secondary | ICD-10-CM

## 2022-08-11 ENCOUNTER — Other Ambulatory Visit: Payer: Self-pay | Admitting: Family Medicine

## 2022-08-11 DIAGNOSIS — B009 Herpesviral infection, unspecified: Secondary | ICD-10-CM

## 2022-08-12 NOTE — Telephone Encounter (Signed)
Is this okay to refill? 

## 2022-09-04 ENCOUNTER — Telehealth: Payer: Self-pay | Admitting: Hematology and Oncology

## 2022-09-04 NOTE — Telephone Encounter (Signed)
Patient called to reschedule 3/20 appointment due to appointment conflicts. Patient rescheduled and notified.

## 2022-09-10 ENCOUNTER — Ambulatory Visit (HOSPITAL_COMMUNITY)
Admission: RE | Admit: 2022-09-10 | Discharge: 2022-09-10 | Disposition: A | Discharge status: Home / Self Care | Payer: Medicare HMO | Source: Ambulatory Visit | Attending: Family Medicine | Admitting: Family Medicine

## 2022-09-10 DIAGNOSIS — Z87891 Personal history of nicotine dependence: Secondary | ICD-10-CM | POA: Diagnosis not present

## 2022-09-10 DIAGNOSIS — R911 Solitary pulmonary nodule: Secondary | ICD-10-CM | POA: Insufficient documentation

## 2022-09-10 DIAGNOSIS — K7460 Unspecified cirrhosis of liver: Secondary | ICD-10-CM | POA: Diagnosis not present

## 2022-09-10 DIAGNOSIS — Z122 Encounter for screening for malignant neoplasm of respiratory organs: Secondary | ICD-10-CM | POA: Diagnosis not present

## 2022-09-10 DIAGNOSIS — J432 Centrilobular emphysema: Secondary | ICD-10-CM | POA: Insufficient documentation

## 2022-09-10 DIAGNOSIS — I700 Atherosclerosis of aorta: Secondary | ICD-10-CM | POA: Diagnosis not present

## 2022-09-10 DIAGNOSIS — I2510 Atherosclerotic heart disease of native coronary artery without angina pectoris: Secondary | ICD-10-CM | POA: Diagnosis not present

## 2022-09-15 ENCOUNTER — Telehealth: Payer: Self-pay | Admitting: Acute Care

## 2022-09-15 NOTE — Telephone Encounter (Signed)
Received call report from [place] with [facility] on patient's LSCT done on 09/10/22. Sarah, please review the result/impression copied below:  IMPRESSION: 1. New right upper lobe pulmonary nodule categorized as Lung-RADS 4AS, suspicious. Follow up low-dose chest CT without contrast in 3 months (please use the following order, "CT CHEST LCS NODULE FOLLOW-UP W/O CM") is recommended. Alternatively, PET may be considered when there is a solid component 63mm or larger. 2. The "S" modifier above refers to potentially clinically significant non lung cancer related findings. Specifically, there is aortic atherosclerosis, in addition to left main and three-vessel coronary artery disease. Please note that although the presence of coronary artery calcium documents the presence of coronary artery disease, the severity of this disease and any potential stenosis cannot be assessed on this non-gated CT examination. Assessment for potential risk factor modification, dietary therapy or pharmacologic therapy may be warranted, if clinically indicated. 3. Mild diffuse bronchial wall thickening with mild to moderate centrilobular and paraseptal emphysema; imaging findings suggestive of underlying COPD. 4. Cirrhosis.  Please advise, thank you.   **Also routing to lung nodule pool**

## 2022-09-17 ENCOUNTER — Ambulatory Visit: Payer: Medicare HMO | Admitting: Hematology and Oncology

## 2022-09-17 ENCOUNTER — Other Ambulatory Visit: Payer: Medicare HMO

## 2022-09-18 ENCOUNTER — Other Ambulatory Visit: Payer: Self-pay | Admitting: *Deleted

## 2022-09-18 ENCOUNTER — Telehealth: Payer: Self-pay | Admitting: Acute Care

## 2022-09-18 DIAGNOSIS — R911 Solitary pulmonary nodule: Secondary | ICD-10-CM

## 2022-09-18 DIAGNOSIS — Z87891 Personal history of nicotine dependence: Secondary | ICD-10-CM

## 2022-09-18 NOTE — Telephone Encounter (Signed)
I have called the patient with the results of her low dose CT Chest. The scan was read as a Lung RADS 4 A : suspicious findings, either short term follow up in 3 months or alternatively  PET Scan evaluation may be considered when there is a solid component of  8 mm or larger.  There is a 7.4 mm new right upper lobe nodule near the apex. I had Dr. Valeta Harms review the nodule, and as it is below  size for PET avidity , plan is for a 3 month follow up, due Mid June 2024. I told her we will give her a call closer to the time to get this scheduled.She is in agreement with this plan.  Pt does state she has had allergies for several weeks. Not bad enough to flare her COPD or to need to see a doctor. ? If this could be a reactive response. She does have a history of breast cancer on the right . We need to watch this closely .She does not qualify for Nodify Lung blood work due to her cancer history.  Dr. Loanne Drilling and Dr. Tomi Bamberger , adding you both  to this to ensure you are aware of the results and plan for follow up. We have ordered the follow up scan and will make sure you are aware of the results and plan moving forward.  Do not hesitate to reach out if you have any questions or concerns.

## 2022-09-24 ENCOUNTER — Telehealth (HOSPITAL_BASED_OUTPATIENT_CLINIC_OR_DEPARTMENT_OTHER): Payer: Self-pay | Admitting: Pulmonary Disease

## 2022-09-24 NOTE — Telephone Encounter (Signed)
Pt states she called her pharmacy and have no record of refill for Eliquis and pt needs refill. Please advise and call pt with an update.   Pharmacy: [facility] through [place]: [phone] F: [phone]

## 2022-09-25 ENCOUNTER — Inpatient Hospital Stay: Payer: Medicare HMO | Attending: Hematology and Oncology

## 2022-09-25 ENCOUNTER — Other Ambulatory Visit: Payer: Self-pay | Admitting: Hematology and Oncology

## 2022-09-25 ENCOUNTER — Inpatient Hospital Stay (HOSPITAL_BASED_OUTPATIENT_CLINIC_OR_DEPARTMENT_OTHER): Payer: Medicare HMO | Admitting: Hematology and Oncology

## 2022-09-25 VITALS — BP 149/55 | HR 83 | Temp 97.7°F | Resp 16 | Wt 231.2 lb

## 2022-09-25 DIAGNOSIS — Z87891 Personal history of nicotine dependence: Secondary | ICD-10-CM | POA: Insufficient documentation

## 2022-09-25 DIAGNOSIS — D72819 Decreased white blood cell count, unspecified: Secondary | ICD-10-CM | POA: Insufficient documentation

## 2022-09-25 DIAGNOSIS — I2699 Other pulmonary embolism without acute cor pulmonale: Secondary | ICD-10-CM | POA: Diagnosis not present

## 2022-09-25 DIAGNOSIS — D696 Thrombocytopenia, unspecified: Secondary | ICD-10-CM | POA: Diagnosis not present

## 2022-09-25 DIAGNOSIS — Z853 Personal history of malignant neoplasm of breast: Secondary | ICD-10-CM | POA: Diagnosis not present

## 2022-09-25 DIAGNOSIS — Z86711 Personal history of pulmonary embolism: Secondary | ICD-10-CM

## 2022-09-25 DIAGNOSIS — Z79899 Other long term (current) drug therapy: Secondary | ICD-10-CM | POA: Insufficient documentation

## 2022-09-25 DIAGNOSIS — Z8481 Family history of carrier of genetic disease: Secondary | ICD-10-CM

## 2022-09-25 DIAGNOSIS — Z8551 Personal history of malignant neoplasm of bladder: Secondary | ICD-10-CM

## 2022-09-25 DIAGNOSIS — Z7901 Long term (current) use of anticoagulants: Secondary | ICD-10-CM | POA: Insufficient documentation

## 2022-09-25 LAB — CMP (CANCER CENTER ONLY)
ALT: 11 U/L (ref 0–44)
AST: 21 U/L (ref 15–41)
Albumin: 3.9 g/dL (ref 3.5–5.0)
Alkaline Phosphatase: 65 U/L (ref 38–126)
Anion gap: 5 (ref 5–15)
BUN: 13 mg/dL (ref 8–23)
CO2: 30 mmol/L (ref 22–32)
Calcium: 9.3 mg/dL (ref 8.9–10.3)
Chloride: 105 mmol/L (ref 98–111)
Creatinine: 0.64 mg/dL (ref 0.44–1.00)
GFR, Estimated: >60 mL/min (ref >60)
Glucose, Bld: 171 mg/dL — ABNORMAL HIGH (ref 70–99)
Potassium: 3.9 mmol/L (ref 3.5–5.1)
Sodium: 140 mmol/L (ref 135–145)
Total Bilirubin: 1.1 mg/dL (ref 0.3–1.2)
Total Protein: 6.8 g/dL (ref 6.5–8.1)

## 2022-09-25 LAB — CBC WITH DIFFERENTIAL (CANCER CENTER ONLY)
Abs Immature Granulocytes: 0.02 10*3/uL (ref 0.00–0.07)
Basophils Absolute: 0.1 10*3/uL (ref 0.0–0.1)
Basophils Relative: 1 %
Eosinophils Absolute: 0.1 10*3/uL (ref 0.0–0.5)
Eosinophils Relative: 2 %
HCT: 40.3 % (ref 36.0–46.0)
Hemoglobin: 13.1 g/dL (ref 12.0–15.0)
Immature Granulocytes: 0 %
Lymphocytes Relative: 15 %
Lymphs Abs: 0.7 10*3/uL (ref 0.7–4.0)
MCH: 29.3 pg (ref 26.0–34.0)
MCHC: 32.5 g/dL (ref 30.0–36.0)
MCV: 90.2 fL (ref 80.0–100.0)
Monocytes Absolute: 0.3 10*3/uL (ref 0.1–1.0)
Monocytes Relative: 6 %
Neutro Abs: 3.4 10*3/uL (ref 1.7–7.7)
Neutrophils Relative %: 76 %
Platelet Count: 122 10*3/uL — ABNORMAL LOW (ref 150–400)
RBC: 4.47 MIL/uL (ref 3.87–5.11)
RDW: 16 % — ABNORMAL HIGH (ref 11.5–15.5)
WBC Count: 4.6 10*3/uL (ref 4.0–10.5)
nRBC: 0 % (ref 0.0–0.2)

## 2022-09-25 MED ORDER — APIXABAN 2.5 MG PO TABS: 2.5000 mg | ORAL_TABLET | Freq: Two times a day (BID) | ORAL | 11 refills | Status: DC

## 2022-09-25 NOTE — Telephone Encounter (Signed)
Called and left voicemail for patient that I sent in her refills. Nothing further needed

## 2022-09-25 NOTE — Progress Notes (Signed)
[place] [phone]   [phone]  PROGRESS NOTE  Patient Care Team: [name], MD as PCP - General (Family Medicine) Croitoru, Dani Gobble, MD as PCP - Cardiology (Cardiology) [name], MD as Referring Physician (Family Medicine) Viona Gilmore, [facility] (Inactive) as Pharmacist (Pharmacist) Cordelia Poche as Physician Assistant (Hematology and Oncology) Juanita Craver, MD as Consulting Physician (Gastroenterology) Jovita Kussmaul, MD as Consulting Physician (General Surgery) Raynelle Bring, MD as Consulting Physician (Urology) Melrose Nakayama, MD as Consulting Physician (Orthopedic Surgery) [name], MD as Consulting Physician (Pulmonary Disease) Rod Can, MD as Consulting Physician (Orthopedic Surgery) Nicholes Stairs, MD as Consulting Physician (Orthopedic Surgery)  Hematological/Oncological History # Bilateral Pulmonary Emboli  1) 09/13/2021: Presented to pulmonology team due to cough with hemoptysis.  CTA chest revealed filling defects within bilateral pulmonary arteries that concerning for pulmonary emboli.  This includes a segmental right lower lobe, middle lobe and lingular pulmonary arteries as well as possibly within the left lower lobe and bilateral upper lobe segmental pulmonary arteries.  Patient was advised to go to the emergency room and she was started on heparin infusion.  Bilateral Doppler ultrasound of the lower extremities was negative for DVT.  She was discharged on Eliquis starter pack.  2)  10/14/2021: Repeat CTA chest: Resolved emboli in the segmental arteries.  No evidence of acute pulmonary embolism  3) 10/16/2021: Establish care with [facility] Hematology  4) 03/19/2022: Recommend to transition to maintenance dose of Eliquis 2.5 mg twice daily  HISTORY OF PRESENTING ILLNESS:  Shannon Schultz 77 y.o. female returns for a follow-up for history of bilateral pulmonary emboli.  She was last seen on 03/19/2022.  In  the interim, she denies any changes to her health.  On exam today, [name] reports she has been well overall interim since her last visit.  She had no major changes in her health.  She reports that she was prescribed a 5 mg twice daily of Eliquis but is been cutting them in half to do 2.5 mg daily.  She notes that she has not had any bleeding issues with this therapy.  She also reports that the cost of the medication has been fine.  She has had no chest pain or shortness of breath above her baseline.  She did desat initially upon presentation to our clinic but at rest when breathing she reached an oxygen level of 9 7%.  She does have oxygen in her car.  She notes overall Eliquis therapy as well tolerated. She has no other complaints. Rest of the 10 point ROS is below.   MEDICAL HISTORY:  Past Medical History:  Diagnosis Date   Arthritis    "hands" (05/15/2017)   Bladder cancer ([place]) 2018   Breast cancer, right ([place]) 1992   DCIS,bladder ca (just dx)   Colon polyp    Complication of anesthesia 1992   "local anesthesia" used was hard to awaken from-no problems since (05/15/2017)   COPD (chronic obstructive pulmonary disease) (HCC)    Coronary artery disease    COVID-19 virus infection 05/05/2019   Diverticulosis    Elevated liver enzymes    fatty liver per ultrasound per pt   FHx: BRCA2 gene positive    sister with BRCA2 mutation (pt tested NEGATIVE)   HLD (hyperlipidemia)    HSV (herpes simplex virus) infection    on hip--on daily suppression   Hypertension    Hypothyroidism    took med 7 yrs after birth  of 1st child   Impaired glucose tolerance    Migraine    Nonalcoholic steatohepatitis ([facility]) 03/23/2022   On home oxygen therapy    "have it available but I'm not using it" (05/15/2017)   Osteopenia    Personal history of breast cancer 11/15/2021   S/P TAVR (transcatheter aortic valve replacement) 08/14/2020   s/p TAVR with a 26 mm Edwards S3U via the left subclavian approach by  Dr. Roxy Manns and Dr. Angelena Form   Severe aortic stenosis    Severe obesity (BMI >= 40) ([place]) 10/06/2014   Sleep apnea    Sleeps with 2 L O2 @ night   Type 2 diabetes mellitus ([place])    Urothelial cancer ([place]) 03/10/2017   Incidental bladder mass noted on CT, papillary urothelial CA on pathology from excision 12/2016; post-op instillation of chemo. Due for f/u cystoscopy 03/2017   Venous insufficiency 09/10/2018   Ventral hernia    Ventral hernia without obstruction or gangrene 05/15/2017   Vitamin D deficiency disease     SURGICAL HISTORY: Past Surgical History:  Procedure Laterality Date   ABDOMINAL HERNIA REPAIR  05/15/2017   BREAST BIOPSY Right 1992   BREAST BIOPSY Left 2018   BREAST EXCISIONAL BIOPSY Left 2018   ATYPICAL DUCTAL HYPERPLASIA INVOLVING A COMPLEX   BREAST LUMPECTOMY WITH RADIOACTIVE SEED LOCALIZATION Left 12/22/2016   Procedure: LEFT BREAST LUMPECTOMY WITH RADIOACTIVE SEED LOCALIZATION;  Surgeon: Jovita Kussmaul, MD;  Location: [place];  Service: General;  Laterality: Left;   CARDIAC CATHETERIZATION     CATARACT EXTRACTION, BILATERAL Bilateral R 03/16/2019 L 03/30/2019   Dr. Kathlen Mody   COLONOSCOPY  2009, 08/2010, 12/2015   Dr. Collene Mares; "only 1 had any polyps" (05/15/2017)   CYSTOSCOPY  04/23/2017   CYSTOSCOPY W/ RETROGRADES Bilateral 01/12/2017   Procedure: CYSTOSCOPY WITH RETROGRADE PYELOGRAM/ EXAM UNDER ANESTHESIA;  Surgeon: Raynelle Bring, MD;  Location: [facility];  Service: Urology;  Laterality: Bilateral;   ENDOVENOUS ABLATION SAPHENOUS VEIN W/ LASER Right 01/09/2022   endovenous laser ablation right greater saphenous vein by Servando Snare MD   EYE SURGERY     FEMUR IM NAIL Right 08/24/2019   Procedure: INTRAMEDULLARY (IM) NAIL FEMORAL;  Surgeon: Rod Can, MD;  Location: [facility];  Service: Orthopedics;  Laterality: Right;   HERNIA REPAIR     INSERTION OF MESH N/A 05/15/2017   Procedure: INSERTION OF MESH;  Surgeon: Jovita Kussmaul, MD;  Location: [place];  Service: General;   Laterality: N/A;   LAPAROSCOPIC CHOLECYSTECTOMY  2003   MASTECTOMY Right 1992   RIGHT/LEFT HEART CATH AND CORONARY ANGIOGRAPHY N/A 07/05/2020   Procedure: RIGHT/LEFT HEART CATH AND CORONARY ANGIOGRAPHY;  Surgeon: Burnell Blanks, MD;  Location: [facility];  Service: Cardiovascular;  Laterality: N/A;   TEE WITHOUT CARDIOVERSION N/A 08/14/2020   Procedure: TRANSESOPHAGEAL ECHOCARDIOGRAM (TEE);  Surgeon: Burnell Blanks, MD;  Location: [place];  Service: Open Heart Surgery;  Laterality: N/A;   TRANSURETHRAL RESECTION OF BLADDER TUMOR WITH MITOMYCIN-C N/A 01/12/2017   Procedure: TRANSURETHRAL RESECTION OF BLADDER TUMOR WITH POSSIBLE POST OPERATIVE INSTILLATION OF MITOMYCIN-C;  Surgeon: Raynelle Bring, MD;  Location: [facility];  Service: Urology;  Laterality: N/A;   TYMPANOSTOMY TUBE PLACEMENT Bilateral [id number]   UMBILICAL HERNIA REPAIR  2003   VENTRAL HERNIA REPAIR N/A 05/15/2017   Procedure: VENTRAL HERNIA REPAIR WITH MESH;  Surgeon: Jovita Kussmaul, MD;  Location: [place];  Service: General;  Laterality: N/A;    SOCIAL HISTORY: Social History   Socioeconomic History  Marital status: Married    Spouse name: Not on file   Number of children: 3   Years of education: Not on file   Highest education level: Not on file  Occupational History   Occupation: Retired  Tobacco Use   Smoking status: Former    Packs/day: 1.00    Years: 46.00    Additional pack years: 0.00    Total pack years: 46.00    Types: Cigarettes    Quit date: 05/31/2011    Years since quitting: 11.3   Smokeless tobacco: Never  Vaping Use   Vaping Use: Never used  Substance and Sexual Activity   Alcohol use: No   Drug use: No   Sexual activity: Yes    Partners: Male    Birth control/protection: Post-menopausal  Other Topics Concern   Not on file  Social History Narrative   Lives with her husband.  Children all live in [place] nearby. No pets. 6 grandchildren.   Social Determinants of Health    Financial Resource Strain: Low Risk  (04/25/2021)   Overall Financial Resource Strain (CARDIA)    Difficulty of Paying Living Expenses: Not very hard  Food Insecurity: No Food Insecurity (03/23/2022)   Hunger Vital Sign    Worried About Running Out of Food in the Last Year: Never true    Ran Out of Food in the Last Year: Never true  Transportation Needs: No Transportation Needs (03/23/2022)   PRAPARE - Hydrologist (Medical): No    Lack of Transportation (Non-Medical): No  Physical Activity: Not on file  Stress: Not on file  Social Connections: Not on file  Intimate Partner Violence: Not At Risk (03/23/2022)   Humiliation, Afraid, Rape, and Kick questionnaire    Fear of Current or Ex-Partner: No    Emotionally Abused: No    Physically Abused: No    Sexually Abused: No    FAMILY HISTORY: Family History  Problem Relation Age of Onset   Heart disease Mother        tachycardia   Heart disease Father    Diabetes Father    Hypertension Father    Asthma Sister    Allergies Sister    Hyperlipidemia Sister    Hashimoto's thyroiditis Sister    Fibromyalgia Sister    Skin cancer Sister        leg; dx after 12; surgery only   Asthma Sister    Allergies Sister    Hyperlipidemia Sister    Hashimoto's thyroiditis Sister    Fibromyalgia Sister    Breast cancer Sister 4       BRCA2 positive; metastatic in late 21s   Cancer Maternal Grandmother        deceased 51; unk. primary; possibly stomach   Tuberculosis Maternal Grandfather    Stroke Paternal Grandmother 50       died of cerebral hemorrhage   Cancer Other        unknown GYN cancers; MGM's sisters   Other Niece        BRCA2 positive; prophylatic mastectomy    ALLERGIES:  is allergic to contrast media [iodinated contrast media], iohexol, lisinopril, sulfa antibiotics, latex, codeine, levofloxacin, lipitor [atorvastatin calcium], meloxicam, nickel, and vicodin  [hydrocodone-acetaminophen].  MEDICATIONS:  Current Outpatient Medications  Medication Sig Dispense Refill   acetaminophen (TYLENOL) 500 MG tablet Take 500-1,000 mg by mouth every 6 (six) hours as needed for moderate pain or headache.     albuterol (PROVENTIL) (2.5 MG/3ML) 0.083%  nebulizer solution Take 3 mLs (2.5 mg total) by nebulization every 6 (six) hours as needed for wheezing or shortness of breath. 75 mL 12   albuterol (VENTOLIN HFA) 108 (90 Base) MCG/ACT inhaler Inhale 2 puffs into the lungs every 6 (six) hours as needed for wheezing or shortness of breath. 3 each 3   Alcohol Swabs (B-D SINGLE USE SWABS REGULAR) PADS Use twice a day when checking blood sugars 100 each 1   apixaban (ELIQUIS) 2.5 MG TABS tablet Take 1 tablet (2.5 mg total) by mouth 2 (two) times daily. 60 tablet 11   azelastine (ASTELIN) 0.1 % nasal spray Place 2 sprays into both nostrils 2 (two) times daily. Use in each nostril as directed 30 mL 2   bisoprolol-hydrochlorothiazide (ZIAC) 10-6.25 MG tablet TAKE 1 TABLET EVERY DAY 90 tablet 0   cetirizine (ZYRTEC) 10 MG tablet Take 10 mg by mouth daily.     clobetasol ointment (TEMOVATE) [id number] % 1 application  every 14 (fourteen) days.     escitalopram (LEXAPRO) 10 MG tablet Take 1 tablet (10 mg total) by mouth daily. Start at 1/2 tablet once daily; increase after 1 week if/when tolerated (Patient taking differently: Take 10 mg by mouth daily.) 90 tablet 3   Fluticasone-Umeclidin-Vilant (TRELEGY ELLIPTA) 100-62.5-25 MCG/ACT AEPB Inhale 1 puff into the lungs daily. 60 each 12   glucose blood test strip 1 each by Other route daily. Use as instructed 100 each 2   guaiFENesin (MUCINEX) 600 MG 12 hr tablet Take 600 mg by mouth at bedtime.     metFORMIN (GLUCOPHAGE-XR) 500 MG 24 hr tablet Take 500 mg by mouth daily with breakfast.     montelukast (SINGULAIR) 10 MG tablet Take 1 tablet (10 mg total) by mouth at bedtime. 90 tablet 3   omeprazole (PRILOSEC) 40 MG capsule Take 1 capsule  (40 mg total) by mouth daily. 30 capsule 5   Semaglutide,0.25 or 0.5MG /DOS, (OZEMPIC, 0.25 OR 0.5 MG/DOSE,) 2 MG/1.5ML SOPN Inject 0.5 mg into the skin once a week. 6 mL 0   simvastatin (ZOCOR) 20 MG tablet TAKE 1 TABLET AT BEDTIME 90 tablet 1   TRUEplus Lancets 30G MISC TEST BLOOD SUGAR EVERY DAY 100 each 3   valACYclovir (VALTREX) 500 MG tablet TAKE 1 TABLET EVERY DAY 90 tablet 3   No current facility-administered medications for this visit.    REVIEW OF SYSTEMS:   Constitutional: ( - ) fevers, ( - )  chills , ( - ) night sweats Eyes: ( - ) blurriness of vision, ( - ) double vision, ( - ) watery eyes Ears, nose, mouth, throat, and face: ( - ) mucositis, ( - ) sore throat Respiratory: ( - ) cough, ( - ) dyspnea, ( - ) wheezes Cardiovascular: ( - ) palpitation, ( - ) chest discomfort, (+ ) lower extremity swelling Gastrointestinal:  ( - ) nausea, ( - ) heartburn, ( - ) change in bowel habits Skin: ( - ) abnormal skin rashes Lymphatics: ( - ) new lymphadenopathy, ( - ) easy bruising Neurological: ( - ) numbness, ( - ) tingling, ( - ) new weaknesses Behavioral/Psych: ( - ) mood change, ( - ) new changes  All other systems were reviewed with the patient and are negative.  PHYSICAL EXAMINATION: ECOG PERFORMANCE STATUS: 0 - Asymptomatic  Vitals:   09/25/22 1139  BP: (!) 149/55  Pulse: 83  Resp: 16  Temp: 97.7 F (36.5 C)  SpO2: 98%    Filed Weights  09/25/22 1139  Weight: 231 lb 3.2 oz (104.9 kg)     GENERAL: well appearing female in NAD  SKIN: skin color, texture, turgor are normal, no rashes or significant lesions EYES: conjunctiva are pink and non-injected, sclera clear LUNGS: clear to auscultation and percussion with normal breathing effort HEART: regular rate & rhythm and no murmurs. Bilateral venous stasis erythema with edema.  Musculoskeletal: no cyanosis of digits and no clubbing  PSYCH: alert & oriented x 3, fluent speech NEURO: no focal motor/sensory  deficits  LABORATORY DATA:  I have reviewed the data as listed    Latest Ref Rng & Units 09/25/2022   10:49 AM 03/24/2022   12:03 PM 03/23/2022    2:44 AM  CBC  WBC 4.0 - 10.5 K/uL 4.6  8.0  6.1   Hemoglobin 12.0 - 15.0 g/dL 13.1  14.0  14.0   Hematocrit 36.0 - 46.0 % 40.3  44.0  43.2   Platelets 150 - 400 K/uL 122  146  133        Latest Ref Rng & Units 09/25/2022   10:49 AM 03/25/2022    8:16 AM 03/23/2022    2:44 AM  CMP  Glucose 70 - 99 mg/dL 171  121  198   BUN 8 - 23 mg/dL 13  26  18    Creatinine 0.44 - 1.00 mg/dL 0.64  0.69  0.63   Sodium 135 - 145 mmol/L 140  136  139   Potassium 3.5 - 5.1 mmol/L 3.9  3.9  4.0   Chloride 98 - 111 mmol/L 105  101  106   CO2 22 - 32 mmol/L 30  26  24    Calcium 8.9 - 10.3 mg/dL 9.3  9.4  9.1   Total Protein 6.5 - 8.1 g/dL 6.8     Total Bilirubin 0.3 - 1.2 mg/dL 1.1     Alkaline Phos 38 - 126 U/L 65     AST 15 - 41 U/L 21     ALT 0 - 44 U/L 11       RADIOGRAPHIC STUDIES: I have personally reviewed the radiological images as listed and agreed with the findings in the report. CT CHEST LUNG CA SCREEN LOW DOSE W/O CM  Result Date: 09/14/2022 CLINICAL DATA:  77 year old female former smoker (quit in 2012) with 32 pack-year history of smoking. Lung cancer screening examination. EXAM: CT CHEST WITHOUT CONTRAST LOW-DOSE FOR LUNG CANCER SCREENING TECHNIQUE: Multidetector CT imaging of the chest was performed following the standard protocol without IV contrast. RADIATION DOSE REDUCTION: This exam was performed according to the departmental dose-optimization program which includes automated exposure control, adjustment of the mA and/or kV according to patient size and/or use of iterative reconstruction technique. COMPARISON:  Low-dose lung cancer screening chest CT 09/09/2021. Chest CTA 03/23/2022. FINDINGS: Cardiovascular: Heart size is normal. There is no significant pericardial fluid, thickening or pericardial calcification. There is aortic  atherosclerosis, as well as atherosclerosis of the great vessels of the mediastinum and the coronary arteries, including calcified atherosclerotic plaque in the left main, left anterior descending, left circumflex and right coronary arteries. Status post TAVR. Mediastinum/Nodes: No pathologically enlarged mediastinal or hilar lymph nodes. Please note that accurate exclusion of hilar adenopathy is limited on noncontrast CT scans. Esophagus is unremarkable in appearance. No axillary lymphadenopathy. Lungs/Pleura: Multiple pulmonary nodules are again noted, most concerning of which is a new nodule in the right upper lobe near the apex (axial image 57), with a volume derived mean diameter of  7.4 mm. No larger more suspicious appearing pulmonary nodules or masses are noted. No acute consolidative airspace disease. No pleural effusions. Mild diffuse bronchial wall thickening with mild to moderate centrilobular and paraseptal emphysema. Upper Abdomen: Aortic atherosclerosis. Liver has a shrunken appearance and nodular contour, indicative of advanced cirrhosis. Status post cholecystectomy. Musculoskeletal: Status post right modified radical mastectomy and right axillary lymph node dissection. There are no aggressive appearing lytic or blastic lesions noted in the visualized portions of the skeleton. IMPRESSION: 1. New right upper lobe pulmonary nodule categorized as Lung-RADS 4AS, suspicious. Follow up low-dose chest CT without contrast in 3 months (please use the following order, "CT CHEST LCS NODULE FOLLOW-UP W/O CM") is recommended. Alternatively, PET may be considered when there is a solid component 71mm or larger. 2. The "S" modifier above refers to potentially clinically significant non lung cancer related findings. Specifically, there is aortic atherosclerosis, in addition to left main and three-vessel coronary artery disease. Please note that although the presence of coronary artery calcium documents the presence of  coronary artery disease, the severity of this disease and any potential stenosis cannot be assessed on this non-gated CT examination. Assessment for potential risk factor modification, dietary therapy or pharmacologic therapy may be warranted, if clinically indicated. 3. Mild diffuse bronchial wall thickening with mild to moderate centrilobular and paraseptal emphysema; imaging findings suggestive of underlying COPD. 4. Cirrhosis. These results will be called to the ordering clinician or representative by the Radiologist Assistant, and communication documented in the PACS or [employer]. Aortic Atherosclerosis (ICD10-I70.0) and Emphysema (ICD10-J43.9). Electronically Signed   By: Vinnie Langton M.D.   On: 09/14/2022 11:37    ASSESSMENT & PLAN Shannon Schultz is a 77 y.o. female who presents for a follow up for history of bilateral pulmonary emboli diagnosed in March 2023.  #Unprovoked Bilateral Pulmonary Emboli --findings at this time are consistent with a unprovoked VTE --No evidence of antiphospholipid syndrome based on testing from 10/16/2021 --Currently on eliquis 2.5 mg twice daily. Will continue indefinitely.  --patient denies any bleeding, bruising, or dark stools on this medication. It is well tolerated. No difficulties accessing/affording the medication -- labs today show white blood cell 4.6, hemoglobin 13.1, MCV 90.2, and platelets of 122.  Additionally creatinine and liver function are within normal limits. --RTC in 6 months strict return precautions for overt signs of bleeding.   #Personal history of breast cancer and bladder cancer: --Patient underwent genetic testing in August 2015 for personal history and family history of breast cancer.  --currently in survivorship  #Leukopenia/Thrombocytopenia:  --Likely secondary to hepatic cirrhosis and splenomegaly seen on CT imaging from 10/14/2021 --Monitor for now.   No orders of the defined types were placed in this  encounter.   All questions were answered. The patient knows to call the clinic with any problems, questions or concerns.  I have spent a total of 25 minutes minutes of face-to-face and non-face-to-face time, preparing to see the patient,  performing a medically appropriate examination, counseling and educating the patient, documenting clinical information in the electronic health record, and care coordination.   Ledell Peoples, MD Department of Hematology/Oncology [place] [facility] Phone: [phone] Pager: [phone] Email: [email or web address]

## 2022-09-25 NOTE — Telephone Encounter (Signed)
Dr Loanne Drilling  Are you wanting patient to continue on Eliquis  Please advise

## 2022-09-25 NOTE — Telephone Encounter (Signed)
Per Hematology, patient will continue on maintenance dosing of 2.5 mg twice a day for lifelong anticoagulation for unprovoked PE.  Orders placed.  Staff please update patient on dosage change.

## 2022-09-26 ENCOUNTER — Telehealth: Payer: Self-pay | Admitting: Hematology and Oncology

## 2022-09-26 ENCOUNTER — Telehealth: Payer: Self-pay | Admitting: Pulmonary Disease

## 2022-09-26 MED ORDER — PREDNISONE 20 MG PO TABS: 40.0000 mg | ORAL_TABLET | Freq: Every day | ORAL | 0 refills | Status: AC

## 2022-09-26 MED ORDER — AZITHROMYCIN 250 MG PO TABS: ORAL_TABLET | ORAL | 0 refills | Status: DC

## 2022-09-26 NOTE — Telephone Encounter (Signed)
Advised patient prednisone and zpak have been sent to her pharmacy. Also advised to see an urgent care if symptoms dont improve through treatment. She verbalized understanding. NFN

## 2022-09-26 NOTE — Telephone Encounter (Signed)
PT can not get appt for Acute. No NP's avail. Would like something called in. Fears she has bronchitis.   PHARM: [facility] @ [place] [phone]

## 2022-09-26 NOTE — Telephone Encounter (Signed)
Spoke with patient, she has productive cough with clear phlegm, nasal congestion, slight wheezing, and sob Has used nebulizer and inhalers, which are not helping   Patient would like something sent in  Dr. Loanne Drilling please advise?  Pharmacy [facility]

## 2022-09-26 NOTE — Telephone Encounter (Signed)
Per 3/28 LOS reached out to patient to schedule; left voicemail.

## 2022-09-26 NOTE — Telephone Encounter (Signed)
Prednisone and azithromycin sent in for COPD exacerbation.  If symptoms persist over the weekend despite treatment please advise her to see urgent care Ok to also schedule follow-up if symptoms are mild and still persist. Ok to use blocked spots if needed

## 2022-10-01 ENCOUNTER — Telehealth: Payer: Self-pay | Admitting: Family Medicine

## 2022-10-01 NOTE — Telephone Encounter (Signed)
Pt ozempic samples have come in. I called pt she will pick up.

## 2022-10-09 ENCOUNTER — Telehealth: Payer: Self-pay

## 2022-10-09 NOTE — Telephone Encounter (Signed)
Pt receives her Ozempic through patient assistance at [employer]. Currently showing a care gap on medication because this will not show as a claim with the insurance company.  Pt is approved through 06/30/23.  [name] Clinical Pharmacist [phone]

## 2022-10-14 ENCOUNTER — Other Ambulatory Visit (HOSPITAL_BASED_OUTPATIENT_CLINIC_OR_DEPARTMENT_OTHER): Payer: Self-pay

## 2022-10-15 ENCOUNTER — Other Ambulatory Visit: Payer: Self-pay | Admitting: Family Medicine

## 2022-10-15 DIAGNOSIS — I10 Essential (primary) hypertension: Secondary | ICD-10-CM

## 2022-10-16 ENCOUNTER — Telehealth: Payer: Self-pay

## 2022-10-16 NOTE — Progress Notes (Signed)
Patient ID: Shannon Schultz, female   DOB: 20-Aug-1945, 77 y.o.   MRN: [id number]  Care Management & Coordination Services Pharmacy Team  Reason for Encounter: Hypertension  Contacted patient to discuss hypertension disease state. Spoke with patient on 10/16/2022     Current antihypertensive regimen:  Bisoprolol-HCTZ 10-6.25 mg 1 tablet daily   Patient verbally confirms she is taking the above medications as directed. Yes  How often are you checking your Blood Pressure? 1-2x per week  she checks her blood pressure in the afternoon after taking her medication.  Current home BP readings:  130/60 132/80 patient reports she remains around this range at home and no higher that 80 on her bottom number.   Wrist or arm cuff: Arm  Any readings above 180/100? No If yes any symptoms of hypertensive emergency? patient denies any symptoms of high blood pressure  What recent interventions/DTPs have been made by any provider to improve Blood Pressure control since last CPP Visit: Patient reports none  Any recent hospitalizations or ED visits since last visit with CPP? No  Patient in agreement with Pharm follow up in July  Adherence Review: Is the patient currently on ACE/ARB medication? No Does the patient have >5 day gap between last estimated fill dates? No    Star Rating Drugs:  Metformin 500 mg - Last filled 03/11/22 90 DS 1 fill remaining until 03/2023 (Verified) Ozempic - Pt assistance Simvastatin 20 mg - Last filled 09/29/22 90 DS at [facility]    Chart Updates: Recent office visits:  05/14/22 Joselyn Arrow, MD - Patient presented for Medicare Annual Wellness Exam. Prescribed Azelastine. Stopped Doxycycline. Stopped Anoro.  Recent consult visits:  09/25/22 Jaci Standard, MD - Patient presented for History of pulmonary embolus and other concerns. No medication changes.   07/16/22 Luciano Cutter, MD (Pulmonology) - Patient presented for Nocturnal hypoxemia and other concerns. No  medication changes.   05/07/22 Heloise Purpura (Urology) - Claims encounter for Personal history of malignant neoplasm of bladder. No other visit details available.   04/21/22 Charlott Holler, MD (Pulmonology) - Patient presented for COPD with chronic bronchitis and emphysema and other concerns. Prescribed Omeprazole.   Hospital visits:  None in previous 6 months  Medications: Outpatient Encounter Medications as of 10/16/2022  Medication Sig Note   acetaminophen (TYLENOL) 500 MG tablet Take 500-1,000 mg by mouth every 6 (six) hours as needed for moderate pain or headache. 05/14/2022: Took 2 this am   albuterol (PROVENTIL) (2.5 MG/3ML) 0.083% nebulizer solution Take 3 mLs (2.5 mg total) by nebulization every 6 (six) hours as needed for wheezing or shortness of breath. 04/02/2022: prn   albuterol (VENTOLIN HFA) 108 (90 Base) MCG/ACT inhaler Inhale 2 puffs into the lungs every 6 (six) hours as needed for wheezing or shortness of breath. 04/02/2022: prn   Alcohol Swabs (B-D SINGLE USE SWABS REGULAR) PADS Use twice a day when checking blood sugars    apixaban (ELIQUIS) 2.5 MG TABS tablet Take 1 tablet (2.5 mg total) by mouth 2 (two) times daily.    azelastine (ASTELIN) 0.1 % nasal spray Place 2 sprays into both nostrils 2 (two) times daily. Use in each nostril as directed    azithromycin (ZITHROMAX) 250 MG tablet Take two tablets on day 1, then one tablet daily on day 2-5.    bisoprolol-hydrochlorothiazide (ZIAC) 10-6.25 MG tablet TAKE 1 TABLET EVERY DAY    cetirizine (ZYRTEC) 10 MG tablet Take 10 mg by mouth daily.    clobetasol  ointment (TEMOVATE) 0.05 % 1 application  every 14 (fourteen) days. 05/14/2022: prn   escitalopram (LEXAPRO) 10 MG tablet Take 1 tablet (10 mg total) by mouth daily. Start at 1/2 tablet once daily; increase after 1 week if/when tolerated (Patient taking differently: Take 10 mg by mouth daily.) 04/02/2022: Taking 1 tablet daily   Fluticasone-Umeclidin-Vilant (TRELEGY ELLIPTA)  100-62.5-25 MCG/ACT AEPB Inhale 1 puff into the lungs daily.    glucose blood test strip 1 each by Other route daily. Use as instructed    guaiFENesin (MUCINEX) 600 MG 12 hr tablet Take 600 mg by mouth at bedtime.    metFORMIN (GLUCOPHAGE-XR) 500 MG 24 hr tablet Take 500 mg by mouth daily with breakfast.    montelukast (SINGULAIR) 10 MG tablet Take 1 tablet (10 mg total) by mouth at bedtime.    omeprazole (PRILOSEC) 40 MG capsule Take 1 capsule (40 mg total) by mouth daily.    Semaglutide,0.25 or 0.5MG /DOS, (OZEMPIC, 0.25 OR 0.5 MG/DOSE,) 2 MG/1.5ML SOPN Inject 0.5 mg into the skin once a week. 09/13/2021: Take on Fridays   simvastatin (ZOCOR) 20 MG tablet TAKE 1 TABLET AT BEDTIME    TRUEplus Lancets 30G MISC TEST BLOOD SUGAR EVERY DAY    valACYclovir (VALTREX) 500 MG tablet TAKE 1 TABLET EVERY DAY    No facility-administered encounter medications on file as of 10/16/2022.    Recent Office Vitals: BP Readings from Last 3 Encounters:  09/25/22 (!) 149/55  07/16/22 (!) 142/60  05/14/22 124/72   Pulse Readings from Last 3 Encounters:  09/25/22 83  07/16/22 73  05/14/22 80    Wt Readings from Last 3 Encounters:  09/25/22 231 lb 3.2 oz (104.9 kg)  07/16/22 225 lb 3.2 oz (102.2 kg)  05/14/22 226 lb 9.6 oz (102.8 kg)     Kidney Function Lab Results  Component Value Date/Time   CREATININE 0.64 09/25/2022 10:49 AM   CREATININE 0.69 03/25/2022 08:16 AM   CREATININE 0.63 03/23/2022 02:44 AM   CREATININE 0.66 03/19/2022 10:06 AM   CREATININE 0.75 03/05/2017 07:36 AM   CREATININE 0.78 02/26/2017 10:38 AM   GFR 60.99 09/13/2021 11:02 AM   GFRNONAA >60 09/25/2022 10:49 AM   GFRAA 97 07/02/2020 11:49 AM       Latest Ref Rng & Units 09/25/2022   10:49 AM 03/25/2022    8:16 AM 03/23/2022    2:44 AM  BMP  Glucose 70 - 99 mg/dL 161  096  045   BUN 8 - 23 mg/dL Creatinine 0.44 - 1.00 mg/dL 4.09  8.11  9.14   Sodium 135 - 145 mmol/L 140  136  139   Potassium 3.5 - 5.1 mmol/L  3.9  3.9  4.0   Chloride 98 - 111 mmol/L 105  101  106   CO2 22 - 32 mmol/L Calcium 8.9 - 10.3 mg/dL 9.3  9.4  9.1      Pamala Duffel CMA Clinical Pharmacist Assistant [phone]

## 2022-10-22 ENCOUNTER — Other Ambulatory Visit: Payer: Self-pay | Admitting: Pulmonary Disease

## 2022-10-28 ENCOUNTER — Other Ambulatory Visit: Payer: Self-pay

## 2022-10-28 ENCOUNTER — Telehealth: Payer: Self-pay | Admitting: Pulmonary Disease

## 2022-10-28 MED ORDER — MONTELUKAST SODIUM 10 MG PO TABS: 10.0000 mg | ORAL_TABLET | Freq: Every day | ORAL | 3 refills | Status: DC

## 2022-10-28 NOTE — Telephone Encounter (Signed)
Pt needs a refill sent in for montelukast (SINGULAIR) 10 MG tablet [[id number]]  Please send to [facility]

## 2022-10-28 NOTE — Telephone Encounter (Signed)
Refill of singular has been sent to pharmacy. Patient is aware. NFN

## 2022-11-19 ENCOUNTER — Encounter: Payer: Medicare HMO | Admitting: Family Medicine

## 2022-11-19 NOTE — Progress Notes (Signed)
Chief Complaint  Patient presents with   Medical Management of Chronic Issues    Fasting med check. No new concerns. Newly diagnosed with macular degeneration. Was a little blurry on the way over here but if she look down it is ok.    Patient presents for 6 month follow-up on chronic problems. She has no specific concerns today.  She has had some worsening of her vision. She has known macular degeneration (dry), and is on preservision. She no longer drives at night, but vision is okay during the day.  Had some blurriness today--improves if she tilts her head forwards. Denies problems in office, but noticed earlier in the day.  COPD: She was hospitalized in September 2023 with COPD exacerbation.  She last saw Dr. Everardo All in 06/2022. Daytime oxygenation improved, only using oxygen at night (pt preferred this over CPAP).  She continues to do well on Trelegy 100.  Denies significant shortness of breath, cough or wheezing.  Hasn't needed any albuterol since September. Reflux has improved, is controlled.  Occ symptoms if she eats certain foods, such as chili. She takes omeprazole 40mg --she couldn't recall if she takes it every day or not.  She later realized she takes it depending on what she eats.   H/o bilateral pulmonary embolism. She is under care of hematology, who recommends lifelong anticoagulation. Dose of eliquis was lowered 02/2022 to 2.5mg  BID. She denies bleeding, chest pain, shortness of breath. Bruising on forearms only.  Lung cancer screening:  Last CT 08/2022: IMPRESSION: 1. New right upper lobe pulmonary nodule categorized as Lung-RADS 4AS, suspicious. Follow up low-dose chest CT without contrast in 3 months (please use the following order, "CT CHEST LCS NODULE FOLLOW-UP W/O CM") is recommended. Alternatively, PET may be considered when there is a solid component 8mm or larger. 2. The "S" modifier above refers to potentially clinically significant non lung cancer related findings.  Specifically, there is aortic atherosclerosis, in addition to left main and three-vessel coronary artery disease. Please note that although the presence of coronary artery calcium documents the presence of coronary artery disease, the severity of this disease and any potential stenosis cannot be assessed on this non-gated CT examination. Assessment for potential risk factor modification, dietary therapy or pharmacologic therapy may be warranted, if clinically indicated. 3. Mild diffuse bronchial wall thickening with mild to moderate centrilobular and paraseptal emphysema; imaging findings suggestive of underlying COPD. 4. Cirrhosis. She is scheduled for f/u CT in June.  History of breast cancer.  10/2021 met with genetics counselor, declined proceeding with testing. Mammogram is UTD, due again in June, denies breast concerns.   Anxiety:  She has reported that her eczema flares are often triggered by her anxiety.  She previously reported sometimes feeling overwhelmed/anxious regarding her health issues (COPD, dry macular degeneration). She was started on lexapro in 12/2021 (and given some alprazolam to use sparingly, which she hasn't needed).  She noted significant improvement in her anxiety at f/u visits, and today she reports that her anxiety remains well controlled.      05/14/2022    1:45 PM 02/26/2022    3:49 PM 01/16/2022    3:51 PM  GAD 7 : Generalized Anxiety Score  Nervous, Anxious, on Edge 0 0 1  Control/stop worrying 2 0 3  Worry too much - different things 0 0 3  Trouble relaxing 0 0 2  Restless 0 0 3  Easily annoyed or irritable 0 1 1  Afraid - awful might happen 0 0 0  Total GAD 7 Score 2 1 13   Anxiety Difficulty Not difficult at all Not difficult at all Not difficult at all   Eczema:  This had improved some after starting treatment for anxiety, but still has a lot of flares.  Currently reports it flaring at her scalp, back, waist, elbows and legs.  Itchy, dry skin.  Using  some previously prescribed topical medications.  She used to see the dermatologist, hasn't gone back (wasn't happy). She is currently worried about her youngest son's health issues.  Diabetes: At her physical in 04/2021 she was started on Ozempic--to improve control of sugars, and also help with weight loss.  Metformin dose was decreased to 500mg  once daily. She is tolerating 0.5mg  without side effects.  We had discussed the potential to increase Ozempic (and potentially stop metformin), to further help with weight loss. She declined change at that time, wanting to hold off until after vein surgery (which she had). Last A1c was 5.2% in 04/2022. Blood sugars are running 120's-130's Denies hypoglycemia, polydipsia and polyuria.  Last yearly eye exam was in 06/2021 (that we have)--she has been seeing ophtho also for macular degeneration. She is confused--has been getting notices this year from her insurance that she is due for exam. Patient checks feet regularly without concerns.  Denies numbness, tingling (other than intermittent numbness in the 2nd and 3rd toes on the right foot, related to h/o fractures, didn't heal right, per pt). No skin lesions.   Hypertension: Reports compliance with bisoprolol HCT, and denies side effects. Allergic to lisinopril. Denies headaches, chest pain or dizziness.  Edema only in RLE occasionally. BP's elsewhere 120's-130/60's-70's.   BP Readings from Last 3 Encounters:  11/20/22 132/76  09/25/22 (!) 149/55  07/16/22 (!) 142/60     Venous insufficiency and edema:   She is s/p laser ablation on the RLE and was told she didn't need surgery on the left. She continues to wear compression socks on both legs, most days. She no longer takes lasix daily, just as needed.   She denies any skin lesions/wounds.    H/o aortic stenosis--she underwent TAVR 07/2020.  She was prescribed ABX for SBE prophylaxis by cardiologist. She denies syncope, angina, dyspnea (at baseline).  She  was noted to have mild nonobstructive CAD on cardiac cath.  Last echo was 08/2021, last visit with cardiology 02/2022 (Dr. Rubie Maid). Due again September.    Hyperlipidemia follow-up: Patient is reportedly following a low-fat, low cholesterol diet. Compliant with simvastatin and denies medication side effects.  She has known aortic atherosclerosis.   Lipids were at goal on last check:  Lab Results  Component Value Date   CHOL 118 12/09/2021   HDL 54 12/09/2021   LDLCALC 49 12/09/2021   TRIG 70 12/09/2021   CHOLHDL 2.2 12/09/2021      Cirrhosis:  CT 10/2019 revealed hepatic cirrhosis and findings of portal venous hypertension. No evidence of ascites. Dr. Loreta Ave is her GI, only sees her for colonoscopies. She has had normal PT/INR, albumin, LFT's.   H/o bladder cancer--lesion in bladder was noted incidentally on CT 11/2016 prior to hernia surgery. Pathology showed low grade papillary urothelial CA, and she was treated with intravesicular chemo.  She gets yearly cystoscopy, UTD per pt.   She takes Valtrex preventatively for HSV flare on hip.  At one point, after she got her shingles vaccine, her flares decreased, and she changed to prn dosing.  She ultimately restarted taking it daily because she had outbreaks recur regularly. Denies  any outbreaks.   PMH, PSH, SH reviewed  Outpatient Encounter Medications as of 11/20/2022  Medication Sig Note   Alcohol Swabs (B-D SINGLE USE SWABS REGULAR) PADS Use twice a day when checking blood sugars    apixaban (ELIQUIS) 2.5 MG TABS tablet Take 1 tablet (2.5 mg total) by mouth 2 (two) times daily. 11/20/2022: Did not yet take today   azelastine (ASTELIN) 0.1 % nasal spray Place 2 sprays into both nostrils 2 (two) times daily. Use in each nostril as directed 11/20/2022: Did not yet take today    bisoprolol-hydrochlorothiazide (ZIAC) 10-6.25 MG tablet TAKE 1 TABLET EVERY DAY    cetirizine (ZYRTEC) 10 MG tablet Take 10 mg by mouth daily. 11/20/2022: Did not yet  take today    escitalopram (LEXAPRO) 10 MG tablet Take 1 tablet (10 mg total) by mouth daily. Start at 1/2 tablet once daily; increase after 1 week if/when tolerated (Patient taking differently: Take 10 mg by mouth daily.) 11/20/2022: Did not yet take today    Fluticasone-Umeclidin-Vilant (TRELEGY ELLIPTA) 100-62.5-25 MCG/ACT AEPB Inhale 1 puff into the lungs daily. 11/20/2022: Did not yet take today    glucose blood test strip 1 each by Other route daily. Use as instructed    guaiFENesin (MUCINEX) 600 MG 12 hr tablet Take 600 mg by mouth at bedtime. 11/20/2022: Did not yet take today    metFORMIN (GLUCOPHAGE-XR) 500 MG 24 hr tablet Take 500 mg by mouth daily with breakfast. 11/20/2022: Did not yet take today    montelukast (SINGULAIR) 10 MG tablet Take 1 tablet (10 mg total) by mouth at bedtime. 11/20/2022: Did not yet take today    omeprazole (PRILOSEC) 40 MG capsule Take 1 capsule (40 mg total) by mouth daily. 11/20/2022: Did not yet take today    Semaglutide,0.25 or 0.5MG /DOS, (OZEMPIC, 0.25 OR 0.5 MG/DOSE,) 2 MG/1.5ML SOPN Inject 0.5 mg into the skin once a week. 09/13/2021: Take on Fridays   simvastatin (ZOCOR) 20 MG tablet TAKE 1 TABLET AT BEDTIME 11/20/2022: Did not yet take today    TRUEplus Lancets 30G MISC TEST BLOOD SUGAR EVERY DAY    valACYclovir (VALTREX) 500 MG tablet TAKE 1 TABLET EVERY DAY 11/20/2022: Did not yet take today    acetaminophen (TYLENOL) 500 MG tablet Take 500-1,000 mg by mouth every 6 (six) hours as needed for moderate pain or headache. (Patient not taking: Reported on 11/20/2022) 11/20/2022: As needed   albuterol (PROVENTIL) (2.5 MG/3ML) 0.083% nebulizer solution Take 3 mLs (2.5 mg total) by nebulization every 6 (six) hours as needed for wheezing or shortness of breath. (Patient not taking: Reported on 11/20/2022) 11/20/2022: As needed   albuterol (VENTOLIN HFA) 108 (90 Base) MCG/ACT inhaler Inhale 2 puffs into the lungs every 6 (six) hours as needed for wheezing or  shortness of breath. (Patient not taking: Reported on 11/20/2022) 11/20/2022: As needed   clobetasol ointment (TEMOVATE) 0.05 % 1 application  every 14 (fourteen) days. (Patient not taking: Reported on 11/20/2022) 11/20/2022: prn   [DISCONTINUED] azithromycin (ZITHROMAX) 250 MG tablet Take two tablets on day 1, then one tablet daily on day 2-5.    No facility-administered encounter medications on file as of 11/20/2022.   Allergies  Allergen Reactions   Contrast Media [Iodinated Contrast Media] Anaphylaxis, Shortness Of Breath and Other (See Comments)    Could not breath   Iohexol Anaphylaxis, Shortness Of Breath and Other (See Comments)    Immediately could not breathe   Lisinopril Anaphylaxis, Shortness Of Breath and Rash  Sulfa Antibiotics Anaphylaxis and Other (See Comments)    Historical from mother, pt states that mother says she almost died from this drug   Latex Other (See Comments)    Unless against on skin for a long time, blisters. Short term is okay.   Codeine Nausea Only, Anxiety and Other (See Comments)    insomnia   Levofloxacin Other (See Comments)    insomnia   Lipitor [Atorvastatin Calcium] Rash   Meloxicam Rash    Broke out in a rash on her stomach,back, legs, and behind ears.   Nickel Other (See Comments)    With earrings pt has soreness and drainage from piercing   Vicodin [Hydrocodone-Acetaminophen] Itching    ROS:  no fever, chills, URI symptoms, headaches, dizziness, chest pain.  Denies abdominal pain, flank pain, vaginal discharge, hematuria, dysuria. No n/v/d. No bleeding, rashes. Chronic edema in LE's has improved, s/p surgery on the right.  Occasional swelling, mild. Breathing is at baseline Moods are good, anxiety improved. Easy bruising on forearms (unchanged) Allergies are controlled with allertec See HPI   PHYSICAL EXAM:  BP 132/76   Pulse 68   Ht 5' 2.5" (1.588 m)   Wt 233 lb 9.6 oz (106 kg)   LMP  (LMP Unknown)   BMI 42.05 kg/m   Wt  Readings from Last 3 Encounters:  11/20/22 233 lb 9.6 oz (106 kg)  09/25/22 231 lb 3.2 oz (104.9 kg)  07/16/22 225 lb 3.2 oz (102.2 kg)   Pleasant, well-appearing female, in no distress HEENT: conjunctiva and sclera are clear, EOMI. OP clear Neck: no lymphadenopathy, thyromegaly or mass Heart: regular rate and rhythm, 2/6 SEM at RUSB Lungs: clear, no wheezes, rales ronchi. Decreased air movement throughout. Back: no CVA tenderness Abdomen: obese, soft, nontender Extremities: wearing compression socks.  Minimal edema on the left.  Trace  pretibial edema on the right.  Calves nontender. Psych: normal mood, affect, hygiene and urine Neuro: alert and oriented.  Cranial nerves intact. Normal gait  Skin:  large patch of eczema (pink, slightly hyperpigmented, thickened and dry) at the L elbow (posteriorly) Very dry skin on the back, but otherwise no rash at waist or back. No rash at gluteal cleft. Senile purpura on forearms  Lab Results  Component Value Date   HGBA1C 5.7 (A) 11/20/2022    ASSESSMENT/PLAN:  Type 2 diabetes mellitus with other specified complication, without long-term current use of insulin (HCC) Assessment & Plan: Controlled with low dose metformin and ozempic.  A1c slightly higher, but still controlled.  Discussed increasing ozempic dose for wt loss, pt hesitant. Cont current regimen  Orders: -     POCT glycosylated hemoglobin (Hb A1C) -     Microalbumin / creatinine urine ratio -     Glucose, random  Hypertension associated with diabetes (HCC)  Hyperlipidemia associated with type 2 diabetes mellitus (HCC) Assessment & Plan: Continue statin. LDL at goal  Orders: -     Lipid panel  Cirrhosis of liver without ascites, unspecified hepatic cirrhosis type [facility]) Assessment & Plan: Strongly encouraged weight loss, risks reviewed.  Recheck labs.  Due to see GI for colonoscopy soon, to discuss cirrhosis  Orders: -     PT and PTT  Aortic atherosclerosis  (HCC) Assessment & Plan: Continue statin   Generalized anxiety disorder Assessment & Plan: Improved. Continue lexapro at current dose   Essential hypertension, benign Assessment & Plan: BP's good at home. Cont bisoprolol HCT.  Allergic to lisinopril. Encouraged daily exercise, weight loss  Chronic hypoxemic respiratory failure (HCC) Assessment & Plan: Now only needing oxygen at night   COPD, group B, by GOLD 2017 classification [facility]) Assessment & Plan: Controlled on current regimen. Under care of pulm   History of pulmonary embolus (PE) Assessment & Plan: On eliquis without any bleeding. No change in pulmonary symptoms (COPD). Denies chest pain, SOB   Class 3 severe obesity due to excess calories with serious comorbidity and body mass index (BMI) of 40.0 to 44.9 in adult [facility]) Assessment & Plan: Discussed complications of obesity. Strongly encouraged MWM clinic, she will look into and think about. Consider increasing ozempic dose if not planning to go.   Gastroesophageal reflux disease without esophagitis Assessment & Plan: Discussed her PPI use. We discussed using lowest effective dose, using it daily rather than prn (as I suspect it may contribute to wheezing/breathing issues. This was started by pulm). She can discuss further with pulm, who rx'd the omeprazole.   Eczema, unspecified type Assessment & Plan: Pt states derm prev biopsied.  Unusual location for eczema (posterior elbow rather than antecubital). Since having ongoing flares, encouraged her to f/u with derm   Allergic rhinitis, unspecified seasonality, unspecified trigger Assessment & Plan: Controlled with cetirizine   HSV (herpes simplex virus) infection Assessment & Plan: No outbreaks on daily valtrex      Reminded to schedule DM eye exam. Declined COVID booster. Reminded to get RSV vaccine in the Fall from the pharmacy.   Schedule CPE/AWV for 6 mos  Discussed MWM

## 2022-11-20 ENCOUNTER — Other Ambulatory Visit: Payer: Self-pay | Admitting: Family Medicine

## 2022-11-20 ENCOUNTER — Ambulatory Visit (INDEPENDENT_AMBULATORY_CARE_PROVIDER_SITE_OTHER): Payer: Medicare HMO | Admitting: Family Medicine

## 2022-11-20 ENCOUNTER — Encounter: Payer: Self-pay | Admitting: Family Medicine

## 2022-11-20 VITALS — BP 132/76 | HR 68 | Ht 62.5 in | Wt 233.6 lb

## 2022-11-20 DIAGNOSIS — E785 Hyperlipidemia, unspecified: Secondary | ICD-10-CM | POA: Diagnosis not present

## 2022-11-20 DIAGNOSIS — L309 Dermatitis, unspecified: Secondary | ICD-10-CM

## 2022-11-20 DIAGNOSIS — Z1231 Encounter for screening mammogram for malignant neoplasm of breast: Secondary | ICD-10-CM

## 2022-11-20 DIAGNOSIS — F411 Generalized anxiety disorder: Secondary | ICD-10-CM

## 2022-11-20 DIAGNOSIS — I10 Essential (primary) hypertension: Secondary | ICD-10-CM | POA: Diagnosis not present

## 2022-11-20 DIAGNOSIS — J309 Allergic rhinitis, unspecified: Secondary | ICD-10-CM

## 2022-11-20 DIAGNOSIS — I700 Atherosclerosis of aorta: Secondary | ICD-10-CM

## 2022-11-20 DIAGNOSIS — E1159 Type 2 diabetes mellitus with other circulatory complications: Secondary | ICD-10-CM

## 2022-11-20 DIAGNOSIS — Z86711 Personal history of pulmonary embolism: Secondary | ICD-10-CM | POA: Diagnosis not present

## 2022-11-20 DIAGNOSIS — J449 Chronic obstructive pulmonary disease, unspecified: Secondary | ICD-10-CM

## 2022-11-20 DIAGNOSIS — K7460 Unspecified cirrhosis of liver: Secondary | ICD-10-CM

## 2022-11-20 DIAGNOSIS — B009 Herpesviral infection, unspecified: Secondary | ICD-10-CM

## 2022-11-20 DIAGNOSIS — J9611 Chronic respiratory failure with hypoxia: Secondary | ICD-10-CM

## 2022-11-20 DIAGNOSIS — K219 Gastro-esophageal reflux disease without esophagitis: Secondary | ICD-10-CM

## 2022-11-20 DIAGNOSIS — E1169 Type 2 diabetes mellitus with other specified complication: Secondary | ICD-10-CM | POA: Diagnosis not present

## 2022-11-20 LAB — POCT GLYCOSYLATED HEMOGLOBIN (HGB A1C): Hemoglobin A1C: 5.7 % — AB (ref 4.0–5.6)

## 2022-11-20 NOTE — Patient Instructions (Addendum)
Consider trying a lower dose of omeprazole (20mg ) if you are doing very well with avoiding triggering foods, and not having reflux or heartburn issues. I think staying on it (rather than using it just with certain foods) is a good idea since your breathing can go bad very quickly (and reflux can contribute to wheezing). Feel free to ask more details to your prescribing doctor (pulmonary) about the omeprazole fi you have questions.  Please schedule your yearly mammogram.  I encourage you to follow up with a dermatologist for your eczema/skin rashes.  I believe you are due for your diabetic eye exam.  Contact your eye doctor to verify (but if you are getting notices from your insurance, you are likely due).  You are due for 7 year follow-up colonoscopy in July with Dr. Loreta Ave.  Mention the cirrhosis (to follow on that as well)  Last imaging was 06/2020.  I encourage you to consider going to the [facility] through [facility].  They will work with you one on one regarding your diet and weight loss, including helping manage your medications.

## 2022-11-21 LAB — PT AND PTT
INR: 1.1 (ref 0.9–1.2)
Prothrombin Time: 11.9 s (ref 9.1–12.0)
aPTT: 32 s (ref 24–33)

## 2022-11-21 LAB — MICROALBUMIN / CREATININE URINE RATIO
Creatinine, Urine: 51.7 mg/dL
Microalb/Creat Ratio: 33 mg/g creat — ABNORMAL HIGH (ref 0–29)
Microalbumin, Urine: 16.9 ug/mL

## 2022-11-21 LAB — LIPID PANEL
Chol/HDL Ratio: 2.1 ratio (ref 0.0–4.4)
Cholesterol, Total: 113 mg/dL (ref 100–199)
HDL: 54 mg/dL (ref >39)
LDL Chol Calc (NIH): 45 mg/dL (ref 0–99)
Triglycerides: 67 mg/dL (ref 0–149)
VLDL Cholesterol Cal: 14 mg/dL (ref 5–40)

## 2022-11-21 LAB — GLUCOSE, RANDOM: Glucose: 114 mg/dL — ABNORMAL HIGH (ref 70–99)

## 2022-11-22 DIAGNOSIS — E1169 Type 2 diabetes mellitus with other specified complication: Secondary | ICD-10-CM | POA: Insufficient documentation

## 2022-11-22 DIAGNOSIS — K219 Gastro-esophageal reflux disease without esophagitis: Secondary | ICD-10-CM | POA: Insufficient documentation

## 2022-11-22 DIAGNOSIS — I152 Hypertension secondary to endocrine disorders: Secondary | ICD-10-CM | POA: Insufficient documentation

## 2022-11-22 DIAGNOSIS — L309 Dermatitis, unspecified: Secondary | ICD-10-CM | POA: Insufficient documentation

## 2022-11-22 DIAGNOSIS — F411 Generalized anxiety disorder: Secondary | ICD-10-CM | POA: Insufficient documentation

## 2022-11-22 NOTE — Assessment & Plan Note (Signed)
Controlled with cetirizine

## 2022-11-22 NOTE — Assessment & Plan Note (Signed)
Now only needing oxygen at night

## 2022-11-22 NOTE — Assessment & Plan Note (Signed)
Pt states derm prev biopsied.  Unusual location for eczema (posterior elbow rather than antecubital). Since having ongoing flares, encouraged her to f/u with derm

## 2022-11-22 NOTE — Assessment & Plan Note (Signed)
Continue statin LDL at goal

## 2022-11-22 NOTE — Assessment & Plan Note (Signed)
BP's good at home. Cont bisoprolol HCT.  Allergic to lisinopril. Encouraged daily exercise, weight loss

## 2022-11-22 NOTE — Assessment & Plan Note (Signed)
On eliquis without any bleeding. No change in pulmonary symptoms (COPD). Denies chest pain, SOB

## 2022-11-22 NOTE — Assessment & Plan Note (Signed)
Discussed complications of obesity. Strongly encouraged MWM clinic, she will look into and think about. Consider increasing ozempic dose if not planning to go.

## 2022-11-22 NOTE — Assessment & Plan Note (Signed)
Discussed her PPI use. We discussed using lowest effective dose, using it daily rather than prn (as I suspect it may contribute to wheezing/breathing issues. This was started by pulm). She can discuss further with pulm, who rx'd the omeprazole.

## 2022-11-22 NOTE — Assessment & Plan Note (Signed)
Strongly encouraged weight loss, risks reviewed.  Recheck labs.  Due to see GI for colonoscopy soon, to discuss cirrhosis

## 2022-11-22 NOTE — Assessment & Plan Note (Signed)
Controlled with low dose metformin and ozempic.  A1c slightly higher, but still controlled.  Discussed increasing ozempic dose for wt loss, pt hesitant. Cont current regimen

## 2022-11-22 NOTE — Assessment & Plan Note (Signed)
No outbreaks on daily valtrex

## 2022-11-22 NOTE — Assessment & Plan Note (Signed)
Improved. Continue lexapro at current dose

## 2022-11-22 NOTE — Assessment & Plan Note (Signed)
Controlled on current regimen. Under care of pulm

## 2022-11-22 NOTE — Assessment & Plan Note (Signed)
Continue statin. 

## 2022-12-01 DIAGNOSIS — H3530 Unspecified macular degeneration: Secondary | ICD-10-CM | POA: Diagnosis not present

## 2022-12-01 LAB — HM DIABETES EYE EXAM

## 2022-12-08 ENCOUNTER — Other Ambulatory Visit: Payer: Self-pay | Admitting: Physician Assistant

## 2022-12-10 ENCOUNTER — Telehealth: Payer: Self-pay | Admitting: Physician Assistant

## 2022-12-10 MED ORDER — AMOXICILLIN 500 MG PO CAPS: ORAL_CAPSULE | ORAL | 3 refills | Status: DC

## 2022-12-10 NOTE — Telephone Encounter (Signed)
Chart reviewed and medication refill was refused because amoxicillin was discontinued by another provider.  Chart indicates it was discontinued on 09/02/21 and marked as completed course.   I spoke with patient and confirmed she has been taking amoxicillin prior to dental appointments and is not taking any additional antibiotics at this time. Patient made aware refill will be sent to pharmacy.

## 2022-12-10 NOTE — Telephone Encounter (Signed)
  Pt c/o medication issue:  1. Name of Medication:  Amoxicillin    2. How are you currently taking this medication (dosage and times per day)? TAKE 4 CAPSULES BY MOUTH 1 HOUR BEFORE DENTAL PROCEDURE   3. Are you having a reaction (difficulty breathing--STAT)? No   4. What is your medication issue? Pt requesting refill for this medication, she received a call today, stating she need to speak with a nurse regarding her refill

## 2022-12-12 ENCOUNTER — Ambulatory Visit (HOSPITAL_COMMUNITY)
Admission: RE | Admit: 2022-12-12 | Discharge: 2022-12-12 | Disposition: A | Discharge status: Home / Self Care | Payer: Medicare HMO | Source: Ambulatory Visit | Attending: Acute Care | Admitting: Acute Care

## 2022-12-12 DIAGNOSIS — Z122 Encounter for screening for malignant neoplasm of respiratory organs: Secondary | ICD-10-CM | POA: Diagnosis not present

## 2022-12-12 DIAGNOSIS — R911 Solitary pulmonary nodule: Secondary | ICD-10-CM | POA: Insufficient documentation

## 2022-12-12 DIAGNOSIS — Z87891 Personal history of nicotine dependence: Secondary | ICD-10-CM

## 2022-12-12 DIAGNOSIS — R918 Other nonspecific abnormal finding of lung field: Secondary | ICD-10-CM | POA: Diagnosis not present

## 2022-12-13 ENCOUNTER — Other Ambulatory Visit: Payer: Self-pay | Admitting: Family Medicine

## 2022-12-13 DIAGNOSIS — E7800 Pure hypercholesterolemia, unspecified: Secondary | ICD-10-CM

## 2022-12-16 ENCOUNTER — Ambulatory Visit
Admission: RE | Admit: 2022-12-16 | Discharge: 2022-12-16 | Disposition: A | Discharge status: Home / Self Care | Payer: Medicare HMO | Source: Ambulatory Visit | Attending: Family Medicine | Admitting: Family Medicine

## 2022-12-16 DIAGNOSIS — Z1231 Encounter for screening mammogram for malignant neoplasm of breast: Secondary | ICD-10-CM | POA: Diagnosis not present

## 2022-12-25 ENCOUNTER — Other Ambulatory Visit: Payer: Self-pay

## 2022-12-25 ENCOUNTER — Telehealth: Payer: Self-pay | Admitting: Acute Care

## 2022-12-25 DIAGNOSIS — Z87891 Personal history of nicotine dependence: Secondary | ICD-10-CM

## 2022-12-25 DIAGNOSIS — R911 Solitary pulmonary nodule: Secondary | ICD-10-CM

## 2022-12-25 NOTE — Telephone Encounter (Signed)
Results/plan faxed to PCP.  Order placed for LDCT 3 months follow up for nodule

## 2022-12-25 NOTE — Telephone Encounter (Signed)
I have called the patient with the result of the low Dose CT follow up. This is a 3 month follow up for a LR 4A. Previously described solid right upper lobe nodule is decreased in size, however there is a new solid nodule of the right upper lobe measuring 7.6 mm. Radiology recommends a 3 month follow up.  She appears to have waxing and waning nodules. She states she has not been sick, and has not had a flare of her underlying lung disease. Plan will be for a 3 month follow up scan. If this scan is abnormal, we will have her follow up with Dr. Everardo All, her pulmonologist. D,D and E>> 3 month follow up due 03/2023. Please fax results to PCP. Thanks so much

## 2022-12-26 ENCOUNTER — Telehealth: Payer: Self-pay

## 2022-12-26 NOTE — Telephone Encounter (Signed)
PA has been APPROVED from 12/26/2022-06/30/2023

## 2022-12-26 NOTE — Telephone Encounter (Signed)
*  Pulm  PA request received for Eliquis 2.5MG  tablets  PA submitted to [facility] via CMM and is pending additional questions/determination  Key: [id number]

## 2022-12-31 ENCOUNTER — Other Ambulatory Visit (HOSPITAL_COMMUNITY): Payer: Self-pay

## 2022-12-31 ENCOUNTER — Telehealth: Payer: Self-pay | Admitting: Pulmonary Disease

## 2022-12-31 NOTE — Telephone Encounter (Signed)
Patient would like the nurse or doctor to call her regarding the cost of her medications, Eliquis and Trelegy.  She stated that she cannot afford these medications and would like to know if there Is an alternative that she can take that is less expensive.  Please advise.  CB# [phone]

## 2023-01-03 ENCOUNTER — Other Ambulatory Visit: Payer: Self-pay | Admitting: Family Medicine

## 2023-01-03 DIAGNOSIS — F411 Generalized anxiety disorder: Secondary | ICD-10-CM

## 2023-01-06 ENCOUNTER — Other Ambulatory Visit: Payer: Self-pay | Admitting: Family Medicine

## 2023-01-06 ENCOUNTER — Other Ambulatory Visit (HOSPITAL_COMMUNITY): Payer: Self-pay

## 2023-01-06 DIAGNOSIS — I10 Essential (primary) hypertension: Secondary | ICD-10-CM

## 2023-01-06 DIAGNOSIS — H353211 Exudative age-related macular degeneration, right eye, with active choroidal neovascularization: Secondary | ICD-10-CM | POA: Diagnosis not present

## 2023-01-06 DIAGNOSIS — E119 Type 2 diabetes mellitus without complications: Secondary | ICD-10-CM | POA: Diagnosis not present

## 2023-01-06 DIAGNOSIS — H353122 Nonexudative age-related macular degeneration, left eye, intermediate dry stage: Secondary | ICD-10-CM | POA: Diagnosis not present

## 2023-01-06 NOTE — Telephone Encounter (Signed)
Contact patient for the following:  1) Offer to pick up Trelegy samples if need (100 mcg strength)  2) Provide patient assistance paperwork for patient to fill out. She can also wait until next visit on 01/12/23 to fill out.

## 2023-01-06 NOTE — Telephone Encounter (Signed)
Assistance forms attached in patients media files for both medications for patient and provider to fill out and send.

## 2023-01-07 NOTE — Telephone Encounter (Signed)
Patient will wait for our next clinic visit to pick up Trelegy samples if available. She already has paperwork for assistance.  Will close encounter.

## 2023-01-11 ENCOUNTER — Other Ambulatory Visit: Payer: Self-pay | Admitting: Family Medicine

## 2023-01-12 ENCOUNTER — Ambulatory Visit (HOSPITAL_BASED_OUTPATIENT_CLINIC_OR_DEPARTMENT_OTHER): Payer: Medicare HMO | Admitting: Pulmonary Disease

## 2023-01-12 ENCOUNTER — Encounter (HOSPITAL_BASED_OUTPATIENT_CLINIC_OR_DEPARTMENT_OTHER): Payer: Self-pay | Admitting: Pulmonary Disease

## 2023-01-12 VITALS — BP 120/58 | HR 82 | Temp 97.8°F | Ht 62.0 in | Wt 224.2 lb

## 2023-01-12 DIAGNOSIS — J439 Emphysema, unspecified: Secondary | ICD-10-CM

## 2023-01-12 DIAGNOSIS — J4489 Other specified chronic obstructive pulmonary disease: Secondary | ICD-10-CM | POA: Diagnosis not present

## 2023-01-12 DIAGNOSIS — G4734 Idiopathic sleep related nonobstructive alveolar hypoventilation: Secondary | ICD-10-CM | POA: Diagnosis not present

## 2023-01-12 MED ORDER — TRELEGY ELLIPTA 100-62.5-25 MCG/ACT IN AEPB: 1.0000 | INHALATION_SPRAY | Freq: Every day | RESPIRATORY_TRACT | 12 refills | Status: DC

## 2023-01-12 NOTE — Patient Instructions (Addendum)
Moderate COPD GOLD B - last exacerbation 08/2022 --CONTINUE Trelegy ONE puff ONCE a day.  --CONTINUE Albuterol AS NEEDED for shortness of breath or wheezing  Nocturnal hypoxemic respiratory failure secondary to mild OSA  --CONTINUE 2L oxygen via nasal cannula nightly  --Plan for repeat walk or ONO on October follow-up  Right upper lobe lung nodule, new Prior nodule improving. With waxing and waning nodules, hopefully this is inflammatory/infectious and will improve with time --Scheduled for repeat CT Chest LCS Nodule 03/16/23

## 2023-01-12 NOTE — Progress Notes (Signed)
Subjective:   PATIENT ID: Shannon Schultz GENDER: female DOB: Apr 17, 1946, MRN: [id number]   HPI  Chief Complaint  Patient presents with   Follow-up    Pt stilll on o2 at night, pt states she is doing well.pt does need refills    [name] is a 77 year old female with allergic rhinitis moderate COPD, HTN, DM2, AS s/p TAVR 07/2020 who presents for follow-up.  Synopsis: Diagnosis of COPD well-controlled on bronchodilators. Exacerbations annually including hospitalization 02/2022. Stepped up to Trelegy in 2023.  09/02/2021 Since her last visit she has been compliant with her Anoro and Singulair daily.  She was treated with azithromycin for COPD flare/acute bronchitis in December 2022.  Her last COPD exacerbation for this was 2021. She currently reports mild cough with nasal congestion. Triggered [place] pollen [place] She is on zyrtec and Singulair. Otherwise she reports her symptoms are well-controlled. Denies wheezing. No limitations in activity. Compliant with oxygen at night however would want to discontinue this if she no longer needs it.  09/30/21 Since our last visit, she was diagnosed with PE. On the day after her CT chest lung screen, she reports abrupt onset of left sided chest pain. No fevers or cough preceding this. Was seen by PCP and treated with antibiotics. She then developed hemoptysis. After seeing Pulmonary NP CTA was ordered and multiple PE diagnosed however study was suboptimal. Started on Eliquis. Since then she reports improved left chest pain, resolved hemoptysis and improved cough though does complain of upper airway congestion/cough.  01/08/22 She reports she has not been on Eliquis for a month. She is scheduled for surgery with Dr. Randie Heinz tomorrow for laser ablation. Denies shortness of breath, chest pain, cough, hemoptysis, shortness of breath or wheezing.  07/16/22 Since our last visit she has been treated for COPD exacerbation including  hospitalization in September. Daytime oxygenation has improved. Wearing nighttime. She is on Trelegy 100. Able to perform housework and able to shop at [facility]. Cough has resolved. Denies significant shortness of breath, cough or wheezing. Reflux has improved.  01/12/23 Since our last visit her last exacerbation was treated as an outpatient in March. Doing well on oxygen at night. She is compliant with her Trelegy. Will have shortness of breath and wheezing with moderate exertion. Will use albuterol as needed. Could use it more often prophylactically.  Social History: 46 pack years. Quit in 2012. During 2018, she lost her sister to breast cancer in early 2018, was diagnosed with breast cancer s/p lumpectomy, had bladder cancer and underwent resection and intravesicular chemo, fell and broke her right shoulder, underwent ventral hernia repair with mesh insertion.   Patient Active Problem List   Diagnosis Date Noted   Type 2 diabetes mellitus with other specified complication (HCC) 11/22/2022   Hypertension associated with diabetes (HCC) 11/22/2022   Generalized anxiety disorder 11/22/2022   Eczema 11/22/2022   Gastroesophageal reflux disease without esophagitis 11/22/2022   COPD with chronic bronchitis and emphysema (HCC) 05/13/2022   COPD exacerbation (HCC) 03/24/2022   Acute on chronic respiratory failure with hypoxia (HCC) 03/23/2022   Nonalcoholic steatohepatitis (NASH) 03/23/2022   Personal history of colonic polyps 03/23/2022   Diverticulosis of colon 03/23/2022   History of pulmonary embolus (PE) 03/19/2022   Personal history of breast cancer 11/15/2021   Pulmonary embolism (HCC) 09/14/2021   Hemoptysis 09/13/2021   Pleural effusion 09/13/2021   Diastolic dysfunction 09/13/2021   URI (upper respiratory infection) 09/13/2021  Nocturnal hypoxemia 11/12/2020   S/P TAVR (transcatheter aortic valve replacement) 08/14/2020   Severe aortic stenosis    Contrast media allergy 06/26/2020    AAA (abdominal aortic aneurysm) without rupture 06/26/2020   Allergic rhinitis 04/27/2020   Upper airway cough syndrome 12/20/2019   Hip fracture (HCC) 08/25/2019   Venous insufficiency 09/10/2018   Sinusitis 06/25/2018   Ophthalmic migraine 03/16/2018   COPD, group B, by GOLD 2017 classification (HCC) 09/30/2017   Chronic hypoxemic respiratory failure (HCC) 09/29/2017   Ventral hernia without obstruction or gangrene 05/15/2017   Cirrhosis of liver without ascites (HCC) 03/11/2017   Urothelial cancer (HCC) 03/10/2017   Atherosclerosis of coronary artery 09/14/2016   Aortic atherosclerosis (HCC) 09/14/2016   Class 3 severe obesity due to excess calories with serious comorbidity and body mass index (BMI) of 40.0 to 44.9 in adult (HCC) 11/10/2015   Thrombocytopenia (HCC) 08/17/2015   Elevated troponin 08/17/2015   Hyperlipidemia associated with type 2 diabetes mellitus (HCC) 08/17/2015   COPD with acute exacerbation (HCC) 08/16/2015   Severe obesity (BMI >= 40) (HCC) 10/06/2014   Family history BRCA2 gene positive    HSV (herpes simplex virus) infection 06/07/2012   CAP (community acquired pneumonia) 06/06/2011   Pure hypercholesterolemia 11/13/2010   Essential hypertension, benign 11/13/2010    Outpatient Medications Prior to Visit  Medication Sig Dispense Refill   ACCU-CHEK AVIVA PLUS test strip TEST BLOOD SUGAR EVERY DAY 100 strip 3   acetaminophen (TYLENOL) 500 MG tablet Take 500-1,000 mg by mouth every 6 (six) hours as needed for moderate pain or headache.     albuterol (PROVENTIL) (2.5 MG/3ML) 0.083% nebulizer solution Take 3 mLs (2.5 mg total) by nebulization every 6 (six) hours as needed for wheezing or shortness of breath. 75 mL 12   albuterol (VENTOLIN HFA) 108 (90 Base) MCG/ACT inhaler Inhale 2 puffs into the lungs every 6 (six) hours as needed for wheezing or shortness of breath. 3 each 3   Alcohol Swabs (B-D SINGLE USE SWABS REGULAR) PADS Use twice a day when checking blood  sugars 100 each 1   apixaban (ELIQUIS) 2.5 MG TABS tablet Take 1 tablet (2.5 mg total) by mouth 2 (two) times daily. 60 tablet 11   azelastine (ASTELIN) 0.1 % nasal spray Place 2 sprays into both nostrils 2 (two) times daily. Use in each nostril as directed 30 mL 2   bisoprolol-hydrochlorothiazide (ZIAC) 10-6.25 MG tablet TAKE 1 TABLET EVERY DAY 90 tablet 1   cetirizine (ZYRTEC) 10 MG tablet Take 10 mg by mouth daily.     clobetasol ointment (TEMOVATE) 0.05 % 1 application  every 14 (fourteen) days.     escitalopram (LEXAPRO) 10 MG tablet Take 1 tablet (10 mg total) by mouth daily. 90 tablet 1   guaiFENesin (MUCINEX) 600 MG 12 hr tablet Take 600 mg by mouth at bedtime.     metFORMIN (GLUCOPHAGE-XR) 500 MG 24 hr tablet Take 1 tablet (500 mg total) by mouth daily with breakfast. 90 tablet 1   montelukast (SINGULAIR) 10 MG tablet Take 1 tablet (10 mg total) by mouth at bedtime. 90 tablet 3   omeprazole (PRILOSEC) 40 MG capsule Take 1 capsule (40 mg total) by mouth daily. 30 capsule 5   Semaglutide,0.25 or 0.5MG /DOS, (OZEMPIC, 0.25 OR 0.5 MG/DOSE,) 2 MG/1.5ML SOPN Inject 0.5 mg into the skin once a week. 6 mL 0   simvastatin (ZOCOR) 20 MG tablet TAKE 1 TABLET AT BEDTIME 90 tablet 1   TRUEplus Lancets 30G MISC TEST  BLOOD SUGAR EVERY DAY 100 each 3   valACYclovir (VALTREX) 500 MG tablet TAKE 1 TABLET EVERY DAY 90 tablet 3   Fluticasone-Umeclidin-Vilant (TRELEGY ELLIPTA) 100-62.5-25 MCG/ACT AEPB Inhale 1 puff into the lungs daily. 60 each 12   amoxicillin (AMOXIL) 500 MG capsule Take 4 capsules by mouth one hour prior to dental procedure (Patient not taking: Reported on 01/12/2023) 4 capsule 3   No facility-administered medications prior to visit.    Review of Systems  Constitutional:  Negative for chills, diaphoresis, fever, malaise/fatigue and weight loss.  HENT:  Negative for congestion.   Respiratory:  Positive for sputum production and wheezing. Negative for cough, hemoptysis and shortness of  breath.   Cardiovascular:  Negative for chest pain, palpitations and leg swelling.    Objective:   Vitals:   01/12/23 1050  BP: (!) 120/58  Pulse: 82  Temp: 97.8 F (36.6 C)  TempSrc: Oral  SpO2: 92%  Weight: 101.7 kg  Height: 5\' 2"  (1.575 m)    SpO2: 92 %   Body mass index is 41.01 kg/m.  Physical Exam: General: Well-appearing, no acute distress HENT: [place], AT Eyes: EOMI, no scleral icterus Respiratory: Clear to auscultation bilaterally.  No crackles, wheezing or rales Cardiovascular: RRR, -M/R/G, no JVD Extremities:-Edema,-tenderness Neuro: AAO x4, CNII-XII grossly intact Psych: Normal mood, normal affect  Data Reviewed:  Imaging CT Chest lung screen 09/10/16 -moderate emphysema CT Chest lung screen 09/16/17-moderate emphysema, scattered subcentimeter pulmonary nodules.  Fatty liver with findings of cirrhosis. CT Chest lung screen 11/29/18- interval development of RUL nodule measuring 6.1 mm CT Chest 03/03/19 - slightly decreased RUL nodule measuring 5.3 mm Screening CT chest 03/26/20 - centrilobular emphysema. Scattered tiny pulmonary nodules, stable CTA 07/13/20 Stable pulmonary nodules VQ scan 04/02/2021-no pulmonary embolism CTA 09/13/2021-suboptimal contrast however radiology read concerning for filling defects in bilateral pulmonary arteries including segmental and right middle and lower lobe, lingula and left lower lobe.  Left lower lobe consolidation concerning for pulmonary infarct/pneumonia.  Background emphysema CTA PE 10/14/21 - Resolved segmental emboli. Resolved LLL consolidation with residual scarring. Moderate centrilobular emphysema. Cirrhosis. Splenomegaly 03/23/22 - No PE. Emphysema CT Chest lung screen 09/10/22 - New right lung apex nodule measuring 7.3 mm, multiple pulmonary nodules CT Chest lung screen 12/12/22 - Moderate centrilobular emphysema. New RUL nodule 7.6 mm. Prior 7.4 right lung apex nodule decreased to 5.8 mm  PFTs 08/28/11 FVC 2.71 [92%], FEV1  1.33 (62%), F/F 49, TLC 114%, DLCO 46% Moderate obstructive defect with moderate reduction in diffusion capacity. No bronchodilator response   11/08/15 FVC 2.05 [7%), FEV1 1.23 (55%), F/F 60 Moderate obstructive defect   09/24/16 FVC 2. (82%), FEV1 1.43 (62%), F/F 58 , TLC 99%, DLCO 54% Moderate obstruction with moderate diffusion defect. No bronchodilator response.   Labs A1AT 08/08/16- 162, PIMM  Sleep study Home sleep study 05/03/19 - AHI 8.8 Nadir SpO2 76% Overnight oximetry 09/24/21 - SpO2 <88% for 44 minutes and 32 seconds. Nadir SpO2 80%. Recommend to continue wearing 2L O2.  Assessment & Plan:  77 year old female former smoker with COPD with emphysema, unprovoked PE on lifelong AC, allergic rhinitis, OSA not on CPAP, AS s/p TAVR 2022 who presents for follow-up. Well controlled on Trelegy but has had exacerbation in Sept 2023 and March 2024. Maintain current regiment without de-escalation. Discussed clinical course and management of COPD including bronchodilator regimen, preventive care including vaccinations and action plan for exacerbation.   Reviewed CT scan with patient and identified right upper lobe nodules, waxing  and waning.  Moderate COPD GOLD B - last exacerbation 08/2022 --CONTINUE Trelegy ONE puff ONCE a day.  --CONTINUE Albuterol AS NEEDED for shortness of breath or wheezing  Nocturnal hypoxemic respiratory failure secondary to mild OSA  Discussed CPAP vs oral appliance vs weight loss. Prefers to continue oxygen. No symptoms of fatigue, shortness of breath, headaches. Overall asymptomatic. --CONTINUE 2L oxygen via nasal cannula nightly  --Plan for repeat walk or ONO on October follow-up  Right upper lobe lung nodule, new Prior nodule improving. With waxing and waning nodules, hopefully this is inflammatory/infectious and will improve with time --Scheduled for repeat CT Chest LCS Nodule 03/16/23  Acute pulmonary embolism, unprovoked --Hematology recommends lifelong  anticoagulation on maintenance dosing --Eliquis 2.5 mg BID  Allergic Rhinitis --CONTINUE Zyrtec --CONTINUE Singulair --STOP Flonase due to nose bleeds. Call office for atrovent nasal spray if congestion recurs   Health maintenance  Immunization History  Administered Date(s) Administered   Fluad Quad(high Dose 65+) 04/07/2019, 04/26/2020, 05/08/2021, 03/25/2022   Influenza Split 05/28/2011   Influenza, High Dose Seasonal PF 04/07/2013, 05/15/2014, 05/30/2015, 03/25/2016, 03/05/2017, 04/01/2018   Influenza, Seasonal, Injecte, Preservative Fre 06/07/2012   PFIZER Comirnaty(Gray Top)Covid-19 Tri-Sucrose Vaccine 10/25/2020   PFIZER(Purple Top)SARS-COV-2 Vaccination 08/20/2019, 09/13/2019, 04/14/2020   Pfizer Covid-19 Vaccine Bivalent Booster 68yrs & up 05/08/2021   Pneumococcal Conjugate-13 07/06/2014   Pneumococcal Polysaccharide-23 12/15/2004, 05/28/2011, 05/27/2021   Rsv, Bivalent, Protein Subunit Rsvpref,pf Verdis Frederickson) 05/20/2022   Tdap 02/23/2008, 06/15/2018   Zoster Recombinant(Shingrix) 12/02/2017, 02/06/2018   Zoster, Live 05/17/2010   Annual CT Lung -Enrolled  No orders of the defined types were placed in this encounter.  Meds ordered this encounter  Medications   Fluticasone-Umeclidin-Vilant (TRELEGY ELLIPTA) 100-62.5-25 MCG/ACT AEPB    Sig: Inhale 1 puff into the lungs daily.    Dispense:  60 each    Refill:  12   Return in about 3 months (around 03/31/2023) for ambulatory O2 and ONO.  I have spent a total time of 32-minutes on the day of the appointment including chart review, data review, collecting history, coordinating care and discussing medical diagnosis and plan with the patient/family. Past medical history, allergies, medications were reviewed. Pertinent imaging, labs and tests included in this note have been reviewed and interpreted independently by me.  Allex Madia Mechele Collin, MD [facility] 01/12/2023 11:02 AM

## 2023-01-20 NOTE — Telephone Encounter (Signed)
Pt Assistance Ozempic received,  called pt & informed  

## 2023-02-11 ENCOUNTER — Encounter: Payer: Self-pay | Admitting: Physician Assistant

## 2023-03-03 DIAGNOSIS — L309 Dermatitis, unspecified: Secondary | ICD-10-CM | POA: Diagnosis not present

## 2023-03-03 DIAGNOSIS — I872 Venous insufficiency (chronic) (peripheral): Secondary | ICD-10-CM | POA: Diagnosis not present

## 2023-03-16 ENCOUNTER — Ambulatory Visit (HOSPITAL_COMMUNITY)
Admission: RE | Admit: 2023-03-16 | Discharge: 2023-03-16 | Disposition: A | Discharge status: Home / Self Care | Payer: Medicare HMO | Source: Ambulatory Visit | Attending: Acute Care | Admitting: Acute Care

## 2023-03-16 DIAGNOSIS — Z87891 Personal history of nicotine dependence: Secondary | ICD-10-CM | POA: Diagnosis not present

## 2023-03-16 DIAGNOSIS — J432 Centrilobular emphysema: Secondary | ICD-10-CM | POA: Diagnosis not present

## 2023-03-16 DIAGNOSIS — R911 Solitary pulmonary nodule: Secondary | ICD-10-CM | POA: Diagnosis not present

## 2023-03-27 ENCOUNTER — Inpatient Hospital Stay: Payer: Medicare HMO | Admitting: Hematology and Oncology

## 2023-03-27 ENCOUNTER — Inpatient Hospital Stay: Payer: Medicare HMO | Attending: Hematology and Oncology

## 2023-03-27 ENCOUNTER — Other Ambulatory Visit: Payer: Self-pay | Admitting: Hematology and Oncology

## 2023-03-27 VITALS — BP 137/64 | HR 85 | Temp 97.7°F | Resp 17 | Wt 231.1 lb

## 2023-03-27 DIAGNOSIS — Z86711 Personal history of pulmonary embolism: Secondary | ICD-10-CM | POA: Insufficient documentation

## 2023-03-27 DIAGNOSIS — Z79899 Other long term (current) drug therapy: Secondary | ICD-10-CM | POA: Insufficient documentation

## 2023-03-27 DIAGNOSIS — I2699 Other pulmonary embolism without acute cor pulmonale: Secondary | ICD-10-CM

## 2023-03-27 DIAGNOSIS — D696 Thrombocytopenia, unspecified: Secondary | ICD-10-CM | POA: Insufficient documentation

## 2023-03-27 DIAGNOSIS — Z87891 Personal history of nicotine dependence: Secondary | ICD-10-CM | POA: Insufficient documentation

## 2023-03-27 DIAGNOSIS — Z853 Personal history of malignant neoplasm of breast: Secondary | ICD-10-CM | POA: Insufficient documentation

## 2023-03-27 DIAGNOSIS — Z8551 Personal history of malignant neoplasm of bladder: Secondary | ICD-10-CM | POA: Insufficient documentation

## 2023-03-27 DIAGNOSIS — D72819 Decreased white blood cell count, unspecified: Secondary | ICD-10-CM | POA: Insufficient documentation

## 2023-03-27 LAB — CBC WITH DIFFERENTIAL (CANCER CENTER ONLY)
Abs Immature Granulocytes: 0.02 10*3/uL (ref 0.00–0.07)
Basophils Absolute: 0.1 10*3/uL (ref 0.0–0.1)
Basophils Relative: 1 %
Eosinophils Absolute: 0.1 10*3/uL (ref 0.0–0.5)
Eosinophils Relative: 2 %
HCT: 41.9 % (ref 36.0–46.0)
Hemoglobin: 13.8 g/dL (ref 12.0–15.0)
Immature Granulocytes: 0 %
Lymphocytes Relative: 17 %
Lymphs Abs: 0.9 10*3/uL (ref 0.7–4.0)
MCH: 30.2 pg (ref 26.0–34.0)
MCHC: 32.9 g/dL (ref 30.0–36.0)
MCV: 91.7 fL (ref 80.0–100.0)
Monocytes Absolute: 0.3 10*3/uL (ref 0.1–1.0)
Monocytes Relative: 6 %
Neutro Abs: 3.8 10*3/uL (ref 1.7–7.7)
Neutrophils Relative %: 74 %
Platelet Count: 122 10*3/uL — ABNORMAL LOW (ref 150–400)
RBC: 4.57 MIL/uL (ref 3.87–5.11)
RDW: 15.1 % (ref 11.5–15.5)
WBC Count: 5.2 10*3/uL (ref 4.0–10.5)
nRBC: 0 % (ref 0.0–0.2)

## 2023-03-27 LAB — CMP (CANCER CENTER ONLY)
ALT: 12 U/L (ref 0–44)
AST: 21 U/L (ref 15–41)
Albumin: 3.9 g/dL (ref 3.5–5.0)
Alkaline Phosphatase: 62 U/L (ref 38–126)
Anion gap: 5 (ref 5–15)
BUN: 13 mg/dL (ref 8–23)
CO2: 28 mmol/L (ref 22–32)
Calcium: 9.1 mg/dL (ref 8.9–10.3)
Chloride: 106 mmol/L (ref 98–111)
Creatinine: 0.7 mg/dL (ref 0.44–1.00)
GFR, Estimated: >60 mL/min (ref >60)
Glucose, Bld: 158 mg/dL — ABNORMAL HIGH (ref 70–99)
Potassium: 4 mmol/L (ref 3.5–5.1)
Sodium: 139 mmol/L (ref 135–145)
Total Bilirubin: 1.2 mg/dL (ref 0.3–1.2)
Total Protein: 6.9 g/dL (ref 6.5–8.1)

## 2023-03-27 NOTE — Progress Notes (Signed)
[facility] [phone]   [phone]  PROGRESS NOTE  Patient Care Team: Joselyn Arrow, MD as PCP - General (Family Medicine) Croitoru, Rachelle Hora, MD as PCP - Cardiology (Cardiology) Joselyn Arrow, MD as Referring Physician (Family Medicine) Verner Chol, [facility] (Inactive) as Pharmacist (Pharmacist) Raymondo Band as Physician Assistant (Hematology and Oncology) Charna Elizabeth, MD as Consulting Physician (Gastroenterology) Griselda Miner, MD as Consulting Physician (General Surgery) Heloise Purpura, MD as Consulting Physician (Urology) Marcene Corning, MD as Consulting Physician (Orthopedic Surgery) Luciano Cutter, MD as Consulting Physician (Pulmonary Disease) Samson Frederic, MD as Consulting Physician (Orthopedic Surgery) Yolonda Kida, MD as Consulting Physician (Orthopedic Surgery)  Hematological/Oncological History # Bilateral Pulmonary Emboli  1) 09/13/2021: Presented to pulmonology team due to cough with hemoptysis.  CTA chest revealed filling defects within bilateral pulmonary arteries that concerning for pulmonary emboli.  This includes a segmental right lower lobe, middle lobe and lingular pulmonary arteries as well as possibly within the left lower lobe and bilateral upper lobe segmental pulmonary arteries.  Patient was advised to go to the emergency room and she was started on heparin infusion.  Bilateral Doppler ultrasound of the lower extremities was negative for DVT.  She was discharged on Eliquis starter pack.  2)  10/14/2021: Repeat CTA chest: Resolved emboli in the segmental arteries.  No evidence of acute pulmonary embolism  3) 10/16/2021: Establish care with [facility]  4) 03/19/2022: Recommend to transition to maintenance dose of Eliquis 2.5 mg twice daily  HISTORY OF PRESENTING ILLNESS:  Shannon Schultz 77 y.o. female returns for a follow-up for history of bilateral pulmonary emboli.  She was last seen on 03/19/2022.  In  the interim, she denies any changes to her health.  On exam today, Ms. Flesch reports she has been well overall in the interim since her last visit.  She has had no major changes in her health.  She reports she continues taking Eliquis 2.5 mg twice daily.  She reports he is not having any bleeding but does have easy bruising, particular on her arms.  She reports that she had this issue prior to starting blood thinners.  She reports she has no signs or symptoms concerning for recurrent VTE such as leg pain, leg swelling, chest pain, or shortness of breath.  She reports that the cost of the medications approximately $500 per month at this time because she is in the "donut hole".  She is interested in patient assistance which we helped her with today. She has no other complaints. Rest of the 10 point ROS is below.   MEDICAL HISTORY:  Past Medical History:  Diagnosis Date   Arthritis    "hands" (05/15/2017)   Bladder cancer (HCC) 2018   Breast cancer, right (HCC) 1992   DCIS,bladder ca (just dx)   Colon polyp    Complication of anesthesia 1992   "local anesthesia" used was hard to awaken from-no problems since (05/15/2017)   COPD (chronic obstructive pulmonary disease) (HCC)    Coronary artery disease    COVID-19 virus infection 05/05/2019   Diverticulosis    Elevated liver enzymes    fatty liver per ultrasound per pt   FHx: BRCA2 gene positive    sister with BRCA2 mutation (pt tested NEGATIVE)   HLD (hyperlipidemia)    HSV (herpes simplex virus) infection    on hip--on daily suppression   Hypertension    Hypothyroidism    took med 7 yrs after birth of  1st child   Impaired glucose tolerance    Migraine    Nonalcoholic steatohepatitis (NASH) 03/23/2022   On home oxygen therapy    "have it available but I'm not using it" (05/15/2017)   Osteopenia    Personal history of breast cancer 11/15/2021   S/P TAVR (transcatheter aortic valve replacement) 08/14/2020   s/p TAVR with a 26 mm Edwards  S3U via the left subclavian approach by Dr. Cornelius Moras and Dr. Clifton James   Severe aortic stenosis    Severe obesity (BMI >= 40) (HCC) 10/06/2014   Sleep apnea    Sleeps with 2 L O2 @ night   Type 2 diabetes mellitus (HCC)    Urothelial cancer (HCC) 03/10/2017   Incidental bladder mass noted on CT, papillary urothelial CA on pathology from excision 12/2016; post-op instillation of chemo. Due for f/u cystoscopy 03/2017   Venous insufficiency 09/10/2018   Ventral hernia    Ventral hernia without obstruction or gangrene 05/15/2017   Vitamin D deficiency disease     SURGICAL HISTORY: Past Surgical History:  Procedure Laterality Date   ABDOMINAL HERNIA REPAIR  05/15/2017   BREAST BIOPSY Right 1992   BREAST BIOPSY Left 2018   BREAST EXCISIONAL BIOPSY Left 2018   ATYPICAL DUCTAL HYPERPLASIA INVOLVING A COMPLEX   BREAST LUMPECTOMY WITH RADIOACTIVE SEED LOCALIZATION Left 12/22/2016   Procedure: LEFT BREAST LUMPECTOMY WITH RADIOACTIVE SEED LOCALIZATION;  Surgeon: Griselda Miner, MD;  Location: [facility] OR;  Service: General;  Laterality: Left;   CARDIAC CATHETERIZATION     CATARACT EXTRACTION, BILATERAL Bilateral R 03/16/2019 L 03/30/2019   Dr. Alben Spittle   COLONOSCOPY  2009, 08/2010, 12/2015   Dr. Loreta Ave; "only 1 had any polyps" (05/15/2017)   CYSTOSCOPY  04/23/2017   CYSTOSCOPY W/ RETROGRADES Bilateral 01/12/2017   Procedure: CYSTOSCOPY WITH RETROGRADE PYELOGRAM/ EXAM UNDER ANESTHESIA;  Surgeon: Heloise Purpura, MD;  Location: [facility] ORS;  Service: Urology;  Laterality: Bilateral;   ENDOVENOUS ABLATION SAPHENOUS VEIN W/ LASER Right 01/09/2022   endovenous laser ablation right greater saphenous vein by Lemar Livings MD   EYE SURGERY     FEMUR IM NAIL Right 08/24/2019   Procedure: INTRAMEDULLARY (IM) NAIL FEMORAL;  Surgeon: Samson Frederic, MD;  Location: [facility] ORS;  Service: Orthopedics;  Laterality: Right;   HERNIA REPAIR     INSERTION OF MESH N/A 05/15/2017   Procedure: INSERTION OF MESH;  Surgeon: Griselda Miner, MD;   Location: [facility] OR;  Service: General;  Laterality: N/A;   LAPAROSCOPIC CHOLECYSTECTOMY  2003   MASTECTOMY Right 1992   RIGHT/LEFT HEART CATH AND CORONARY ANGIOGRAPHY N/A 07/05/2020   Procedure: RIGHT/LEFT HEART CATH AND CORONARY ANGIOGRAPHY;  Surgeon: Kathleene Hazel, MD;  Location: [facility] INVASIVE CV LAB;  Service: Cardiovascular;  Laterality: N/A;   TEE WITHOUT CARDIOVERSION N/A 08/14/2020   Procedure: TRANSESOPHAGEAL ECHOCARDIOGRAM (TEE);  Surgeon: Kathleene Hazel, MD;  Location: [facility] OR;  Service: Open Heart Surgery;  Laterality: N/A;   TRANSURETHRAL RESECTION OF BLADDER TUMOR WITH MITOMYCIN-C N/A 01/12/2017   Procedure: TRANSURETHRAL RESECTION OF BLADDER TUMOR WITH POSSIBLE POST OPERATIVE INSTILLATION OF MITOMYCIN-C;  Surgeon: Heloise Purpura, MD;  Location: [facility] ORS;  Service: Urology;  Laterality: N/A;   TYMPANOSTOMY TUBE PLACEMENT Bilateral 1980s   UMBILICAL HERNIA REPAIR  2003   VENTRAL HERNIA REPAIR N/A 05/15/2017   Procedure: VENTRAL HERNIA REPAIR WITH MESH;  Surgeon: Griselda Miner, MD;  Location: [facility] OR;  Service: General;  Laterality: N/A;    SOCIAL HISTORY: Social History   Socioeconomic History  Marital status: Married    Spouse name: Not on file   Number of children: 3   Years of education: Not on file   Highest education level: Not on file  Occupational History   Occupation: Retired  Tobacco Use   Smoking status: Former    Current packs/day: 0.00    Average packs/day: 1 pack/day for 46.0 years (46.0 ttl pk-yrs)    Types: Cigarettes    Start date: 05/30/1965    Quit date: 05/31/2011    Years since quitting: 11.8   Smokeless tobacco: Never  Vaping Use   Vaping status: Never Used  Substance and Sexual Activity   Alcohol use: No   Drug use: No   Sexual activity: Yes    Partners: Male    Birth control/protection: Post-menopausal  Other Topics Concern   Not on file  Social History Narrative   Lives with her husband.  Children all live in [place] nearby. No  pets. 6 grandchildren.   Social Determinants of Health   Financial Resource Strain: Low Risk  (11/20/2022)   Overall Financial Resource Strain (CARDIA)    Difficulty of Paying Living Expenses: Not very hard  Food Insecurity: No Food Insecurity (11/20/2022)   Hunger Vital Sign    Worried About Running Out of Food in the Last Year: Never true    Ran Out of Food in the Last Year: Never true  Transportation Needs: No Transportation Needs (11/20/2022)   PRAPARE - Administrator, Civil Service (Medical): No    Lack of Transportation (Non-Medical): No  Physical Activity: Not on file  Stress: No Stress Concern Present (11/20/2022)   [employer] of Occupational Health - Occupational Stress Questionnaire    Feeling of Stress : Only a little  Social Connections: Socially Integrated (11/20/2022)   Social Connection and Isolation Panel [NHANES]    Frequency of Communication with Friends and Family: Three times a week    Frequency of Social Gatherings with Friends and Family: Once a week    Attends Religious Services: More than 4 times per year    Active Member of [employer] or Organizations: No    Attends Engineer, structural: More than 4 times per year    Marital Status: Married  Catering manager Violence: Unknown (11/20/2022)   Humiliation, Afraid, Rape, and Kick questionnaire    Fear of Current or Ex-Partner: No    Emotionally Abused: No    Physically Abused: Not on file    Sexually Abused: Not on file    FAMILY HISTORY: Family History  Problem Relation Age of Onset   Heart disease Mother        tachycardia   Heart disease Father    Diabetes Father    Hypertension Father    Asthma Sister    Allergies Sister    Hyperlipidemia Sister    Hashimoto's thyroiditis Sister    Fibromyalgia Sister    Skin cancer Sister        leg; dx after 69; surgery only   Asthma Sister    Allergies Sister    Hyperlipidemia Sister    Hashimoto's thyroiditis Sister    Fibromyalgia  Sister    Breast cancer Sister 80       BRCA2 positive; metastatic in late 28s   Cancer Maternal Grandmother        deceased 57; unk. primary; possibly stomach   Tuberculosis Maternal Grandfather    Stroke Paternal Grandmother 51       died of  cerebral hemorrhage   Cancer Other        unknown GYN cancers; MGM's sisters   Other Niece        BRCA2 positive; prophylatic mastectomy    ALLERGIES:  is allergic to contrast media [iodinated contrast media], iohexol, lisinopril, sulfa antibiotics, latex, codeine, levofloxacin, lipitor [atorvastatin calcium], meloxicam, nickel, and vicodin [hydrocodone-acetaminophen].  MEDICATIONS:  Current Outpatient Medications  Medication Sig Dispense Refill   ACCU-CHEK AVIVA PLUS test strip TEST BLOOD SUGAR EVERY DAY 100 strip 3   acetaminophen (TYLENOL) 500 MG tablet Take 500-1,000 mg by mouth every 6 (six) hours as needed for moderate pain or headache.     albuterol (PROVENTIL) (2.5 MG/3ML) 0.083% nebulizer solution Take 3 mLs (2.5 mg total) by nebulization every 6 (six) hours as needed for wheezing or shortness of breath. 75 mL 12   albuterol (VENTOLIN HFA) 108 (90 Base) MCG/ACT inhaler Inhale 2 puffs into the lungs every 6 (six) hours as needed for wheezing or shortness of breath. 3 each 3   Alcohol Swabs (B-D SINGLE USE SWABS REGULAR) PADS Use twice a day when checking blood sugars 100 each 1   apixaban (ELIQUIS) 2.5 MG TABS tablet Take 1 tablet (2.5 mg total) by mouth 2 (two) times daily. 60 tablet 11   azelastine (ASTELIN) 0.1 % nasal spray Place 2 sprays into both nostrils 2 (two) times daily. Use in each nostril as directed 30 mL 2   bisoprolol-hydrochlorothiazide (ZIAC) 10-6.25 MG tablet TAKE 1 TABLET EVERY DAY 90 tablet 1   cetirizine (ZYRTEC) 10 MG tablet Take 10 mg by mouth daily.     clobetasol ointment (TEMOVATE) 0.05 % 1 application  every 14 (fourteen) days.     escitalopram (LEXAPRO) 10 MG tablet Take 1 tablet (10 mg total) by mouth daily. 90  tablet 1   Fluticasone-Umeclidin-Vilant (TRELEGY ELLIPTA) 100-62.5-25 MCG/ACT AEPB Inhale 1 puff into the lungs daily. 60 each 12   guaiFENesin (MUCINEX) 600 MG 12 hr tablet Take 600 mg by mouth at bedtime.     metFORMIN (GLUCOPHAGE-XR) 500 MG 24 hr tablet Take 1 tablet (500 mg total) by mouth daily with breakfast. 90 tablet 1   montelukast (SINGULAIR) 10 MG tablet Take 1 tablet (10 mg total) by mouth at bedtime. 90 tablet 3   omeprazole (PRILOSEC) 40 MG capsule Take 1 capsule (40 mg total) by mouth daily. 30 capsule 5   Semaglutide,0.25 or 0.5MG /DOS, (OZEMPIC, 0.25 OR 0.5 MG/DOSE,) 2 MG/1.5ML SOPN Inject 0.5 mg into the skin once a week. 6 mL 0   simvastatin (ZOCOR) 20 MG tablet TAKE 1 TABLET AT BEDTIME 90 tablet 1   TRUEplus Lancets 30G MISC TEST BLOOD SUGAR EVERY DAY 100 each 3   valACYclovir (VALTREX) 500 MG tablet TAKE 1 TABLET EVERY DAY 90 tablet 3   No current facility-administered medications for this visit.    REVIEW OF SYSTEMS:   Constitutional: ( - ) fevers, ( - )  chills , ( - ) night sweats Eyes: ( - ) blurriness of vision, ( - ) double vision, ( - ) watery eyes Ears, nose, mouth, throat, and face: ( - ) mucositis, ( - ) sore throat Respiratory: ( - ) cough, ( - ) dyspnea, ( - ) wheezes Cardiovascular: ( - ) palpitation, ( - ) chest discomfort, (+ ) lower extremity swelling Gastrointestinal:  ( - ) nausea, ( - ) heartburn, ( - ) change in bowel habits Skin: ( - ) abnormal skin rashes Lymphatics: ( - )  new lymphadenopathy, ( - ) easy bruising Neurological: ( - ) numbness, ( - ) tingling, ( - ) new weaknesses Behavioral/Psych: ( - ) mood change, ( - ) new changes  All other systems were reviewed with the patient and are negative.  PHYSICAL EXAMINATION: ECOG PERFORMANCE STATUS: 0 - Asymptomatic  Vitals:   03/27/23 1138  BP: 137/64  Pulse: 85  Resp: 17  Temp: 97.7 F (36.5 C)  SpO2: 95%   Filed Weights   03/27/23 1138  Weight: 231 lb 1.6 oz (104.8 kg)    GENERAL:  well appearing female in NAD  SKIN: skin color, texture, turgor are normal, no rashes or significant lesions EYES: conjunctiva are pink and non-injected, sclera clear LUNGS: clear to auscultation and percussion with normal breathing effort HEART: regular rate & rhythm and no murmurs. Bilateral venous stasis erythema with edema.  Musculoskeletal: no cyanosis of digits and no clubbing  PSYCH: alert & oriented x 3, fluent speech NEURO: no focal motor/sensory deficits  LABORATORY DATA:  I have reviewed the data as listed    Latest Ref Rng & Units 03/27/2023   11:12 AM 09/25/2022   10:49 AM 03/24/2022   12:03 PM  CBC  WBC 4.0 - 10.5 K/uL 5.2  4.6  8.0   Hemoglobin 12.0 - 15.0 g/dL 40.9  81.1  91.4   Hematocrit 36.0 - 46.0 % 41.9  40.3  44.0   Platelets 150 - 400 K/uL 122  122  146        Latest Ref Rng & Units 03/27/2023   11:12 AM 11/20/2022   12:15 PM 09/25/2022   10:49 AM  CMP  Glucose 70 - 99 mg/dL 782  956  213   BUN 8 - 23 mg/dL 13   13   Creatinine 0.86 - 1.00 mg/dL 5.78   4.69   Sodium 629 - 145 mmol/L 139   140   Potassium 3.5 - 5.1 mmol/L 4.0   3.9   Chloride 98 - 111 mmol/L 106   105   CO2 22 - 32 mmol/L 28   30   Calcium 8.9 - 10.3 mg/dL 9.1   9.3   Total Protein 6.5 - 8.1 g/dL 6.9   6.8   Total Bilirubin 0.3 - 1.2 mg/dL 1.2   1.1   Alkaline Phos 38 - 126 U/L 62   65   AST 15 - 41 U/L 21   21   ALT 0 - 44 U/L 12   11     RADIOGRAPHIC STUDIES: I have personally reviewed the radiological images as listed and agreed with the findings in the report. No results found.  ASSESSMENT & PLAN Shannon Schultz is a 77 y.o. female who presents for a follow up for history of bilateral pulmonary emboli diagnosed in March 2023.  #Unprovoked Bilateral Pulmonary Emboli --findings at this time are consistent with a unprovoked VTE --No evidence of antiphospholipid syndrome based on testing from 10/16/2021 --Currently on eliquis 2.5 mg twice daily. Will continue indefinitely.   --patient denies any bleeding, bruising, or dark stools on this medication. It is well tolerated.  --The cost of the medication is excessive, she reports 500 hours/month.  We will fill out patient assistance forms with her today in order to try to help. -- labs today show white blood cell 5.2, hemoglobin 13.8, MCV 91.7, and platelets of 122.  Additionally creatinine and liver function are within normal limits. --RTC in 6 months strict return precautions for overt signs  of bleeding.   #Personal history of breast cancer and bladder cancer: --Patient underwent genetic testing in August 2015 for personal history and family history of breast cancer.  --currently in survivorship  #Leukopenia/Thrombocytopenia:  --Likely secondary to hepatic cirrhosis and splenomegaly seen on CT imaging from 10/14/2021 --Monitor for now.   No orders of the defined types were placed in this encounter.   All questions were answered. The patient knows to call the clinic with any problems, questions or concerns.  I have spent a total of 25 minutes minutes of face-to-face and non-face-to-face time, preparing to see the patient,  performing a medically appropriate examination, counseling and educating the patient, documenting clinical information in the electronic health record, and care coordination.   Ulysees Barns, MD Department of Hematology/Oncology [facility] Phone: [phone] Pager: [phone] Email: [email or web address]

## 2023-03-30 ENCOUNTER — Telehealth: Payer: Self-pay | Admitting: Acute Care

## 2023-03-30 DIAGNOSIS — Z122 Encounter for screening for malignant neoplasm of respiratory organs: Secondary | ICD-10-CM

## 2023-03-30 DIAGNOSIS — Z87891 Personal history of nicotine dependence: Secondary | ICD-10-CM

## 2023-03-30 NOTE — Telephone Encounter (Signed)
Patient's 3 month follow CT results have resulted as LR2. Patients 11/2022 LDCT resulted as a 4A and needed a 3 month follow up scan. Advised pt of the results and the resolution of the concerning nodule from June. Also advised pt of the cirrhosis noted on the scan, pt already aware of this. Also informed patient of the potential of PAH noted on the scan. Patient has appt with Dr. Everardo All on 10/15. Advised pt to discuss this with JE at that appt. Results also sent to PCP with this information. Patient did not have any additional questions at the time of the call. 12 month annual scan order placed.

## 2023-04-14 ENCOUNTER — Encounter (HOSPITAL_BASED_OUTPATIENT_CLINIC_OR_DEPARTMENT_OTHER): Payer: Self-pay | Admitting: Pulmonary Disease

## 2023-04-14 ENCOUNTER — Ambulatory Visit (HOSPITAL_BASED_OUTPATIENT_CLINIC_OR_DEPARTMENT_OTHER): Payer: Medicare HMO | Admitting: Pulmonary Disease

## 2023-04-14 VITALS — BP 124/68 | HR 56 | Resp 18 | Ht 62.0 in | Wt 231.0 lb

## 2023-04-14 DIAGNOSIS — Z23 Encounter for immunization: Secondary | ICD-10-CM

## 2023-04-14 DIAGNOSIS — J439 Emphysema, unspecified: Secondary | ICD-10-CM

## 2023-04-14 DIAGNOSIS — J9611 Chronic respiratory failure with hypoxia: Secondary | ICD-10-CM | POA: Diagnosis not present

## 2023-04-14 DIAGNOSIS — J4489 Other specified chronic obstructive pulmonary disease: Secondary | ICD-10-CM

## 2023-04-14 NOTE — Patient Instructions (Signed)
Moderate COPD GOLD B - last exacerbation 08/2022 --CONTINUE Trelegy 100 ONE puff ONCE a day.  --CONTINUE Albuterol AS NEEDED for shortness of breath or wheezing  Nocturnal hypoxemic respiratory failure secondary to mild OSA  Discussed CPAP vs oral appliance vs weight loss. Prefers to continue oxygen. No symptoms of fatigue, shortness of breath, headaches. Overall asymptomatic. --CONTINUE 3L pulsed oxygen via nasal cannula with activity --CONTINUE 3L continuous oxygen nightly  --SpO2 >88%  Right upper lobe lung nodule - resolved --CT Lung screen as scheduled

## 2023-04-14 NOTE — Progress Notes (Signed)
Subjective:   PATIENT ID: Shannon Schultz GENDER: female DOB: [birth year], MRN: [id number]   HPI  Chief Complaint  Patient presents with   Follow-up    Breathing has been fine.    [name] is a 77 year old female with allergic rhinitis moderate COPD, HTN, DM2, AS s/p TAVR 07/2020 who presents for follow-up.  Synopsis: Diagnosis of COPD well-controlled on bronchodilators. Exacerbations annually including hospitalization 02/2022. Stepped up to Trelegy in 2023.  09/02/2021 Since her last visit she has been compliant with her Anoro and Singulair daily.  She was treated with azithromycin for COPD flare/acute bronchitis in December 2022.  Her last COPD exacerbation for this was 2021. She currently reports mild cough with nasal congestion. Triggered [place] pollen [place] She is on zyrtec and Singulair. Otherwise she reports her symptoms are well-controlled. Denies wheezing. No limitations in activity. Compliant with oxygen at night however would want to discontinue this if she no longer needs it.  09/30/21 Since our last visit, she was diagnosed with PE. On the day after her CT chest lung screen, she reports abrupt onset of left sided chest pain. No fevers or cough preceding this. Was seen by PCP and treated with antibiotics. She then developed hemoptysis. After seeing Pulmonary NP CTA was ordered and multiple PE diagnosed however study was suboptimal. Started on Eliquis. Since then she reports improved left chest pain, resolved hemoptysis and improved cough though does complain of upper airway congestion/cough.  01/08/22 She reports she has not been on Eliquis for a month. She is scheduled for surgery with Dr. Randie Heinz tomorrow for laser ablation. Denies shortness of breath, chest pain, cough, hemoptysis, shortness of breath or wheezing.  07/16/22 Since our last visit she has been treated for COPD exacerbation including hospitalization in September. Daytime oxygenation has  improved. Wearing nighttime. She is on Trelegy 100. Able to perform housework and able to shop at [facility]. Cough has resolved. Denies significant shortness of breath, cough or wheezing. Reflux has improved.  01/12/23 Since our last visit her last exacerbation was treated as an outpatient in March. Doing well on oxygen at night. She is compliant with her Trelegy. Will have shortness of breath and wheezing with moderate exertion. Will use albuterol as needed. Could use it more often prophylactically.  04/14/23 Since our last visit denies cough or wheezing. Shortness of breath with moderate exertion. Rarely uses albuterol. Compliant with Trelegy daily. Last exacerbation > 6 months. Not needing to use walker. Performs housework but no regular exercise.   Social History: 46 pack years. Quit in 2012. During 2018, she lost her sister to breast cancer in early 2018, was diagnosed with breast cancer s/p lumpectomy, had bladder cancer and underwent resection and intravesicular chemo, fell and broke her right shoulder, underwent ventral hernia repair with mesh insertion.   Patient Active Problem List   Diagnosis Date Noted   Type 2 diabetes mellitus with other specified complication (HCC) 11/22/2022   Hypertension associated with diabetes (HCC) 11/22/2022   Generalized anxiety disorder 11/22/2022   Eczema 11/22/2022   Gastroesophageal reflux disease without esophagitis 11/22/2022   COPD with chronic bronchitis and emphysema (HCC) 05/13/2022   COPD exacerbation (HCC) 03/24/2022   Acute on chronic respiratory failure with hypoxia (HCC) 03/23/2022   Nonalcoholic steatohepatitis (NASH) 03/23/2022   History of colonic polyps 03/23/2022   Diverticulosis of colon 03/23/2022   History of pulmonary embolus (PE) 03/19/2022   Personal history of breast cancer 11/15/2021  Pulmonary embolism (HCC) 09/14/2021   Hemoptysis 09/13/2021   Pleural effusion 09/13/2021   Diastolic dysfunction 09/13/2021   URI (upper  respiratory infection) 09/13/2021   Nocturnal hypoxemia 11/12/2020   S/P TAVR (transcatheter aortic valve replacement) 08/14/2020   Severe aortic stenosis    Contrast media allergy 06/26/2020   AAA (abdominal aortic aneurysm) without rupture 06/26/2020   Allergic rhinitis 04/27/2020   Upper airway cough syndrome 12/20/2019   Hip fracture (HCC) 08/25/2019   Venous insufficiency 09/10/2018   Sinusitis 06/25/2018   Ophthalmic migraine 03/16/2018   COPD, group B, by GOLD 2017 classification (HCC) 09/30/2017   Chronic hypoxemic respiratory failure (HCC) 09/29/2017   Ventral hernia without obstruction or gangrene 05/15/2017   Cirrhosis of liver without ascites (HCC) 03/11/2017   Urothelial cancer (HCC) 03/10/2017   Atherosclerosis of coronary artery 09/14/2016   Aortic atherosclerosis (HCC) 09/14/2016   Class 3 severe obesity due to excess calories with serious comorbidity and body mass index (BMI) of 40.0 to 44.9 in adult (HCC) 11/10/2015   Thrombocytopenia (HCC) 08/17/2015   Elevated troponin 08/17/2015   Hyperlipidemia associated with type 2 diabetes mellitus (HCC) 08/17/2015   COPD with acute exacerbation (HCC) 08/16/2015   Severe obesity (BMI >= 40) (HCC) 10/06/2014   Family history BRCA2 gene positive    HSV (herpes simplex virus) infection 06/07/2012   CAP (community acquired pneumonia) 06/06/2011   Pure hypercholesterolemia 11/13/2010   Essential hypertension, benign 11/13/2010    Outpatient Medications Prior to Visit  Medication Sig Dispense Refill   ACCU-CHEK AVIVA PLUS test strip TEST BLOOD SUGAR EVERY DAY 100 strip 3   acetaminophen (TYLENOL) 500 MG tablet Take 500-1,000 mg by mouth every 6 (six) hours as needed for moderate pain or headache.     albuterol (PROVENTIL) (2.5 MG/3ML) 0.083% nebulizer solution Take 3 mLs (2.5 mg total) by nebulization every 6 (six) hours as needed for wheezing or shortness of breath. 75 mL 12   albuterol (VENTOLIN HFA) 108 (90 Base) MCG/ACT  inhaler Inhale 2 puffs into the lungs every 6 (six) hours as needed for wheezing or shortness of breath. 3 each 3   Alcohol Swabs (B-D SINGLE USE SWABS REGULAR) PADS Use twice a day when checking blood sugars 100 each 1   apixaban (ELIQUIS) 2.5 MG TABS tablet Take 1 tablet (2.5 mg total) by mouth 2 (two) times daily. 60 tablet 11   azelastine (ASTELIN) 0.1 % nasal spray Place 2 sprays into both nostrils 2 (two) times daily. Use in each nostril as directed 30 mL 2   bisoprolol-hydrochlorothiazide (ZIAC) 10-6.25 MG tablet TAKE 1 TABLET EVERY DAY 90 tablet 1   cetirizine (ZYRTEC) 10 MG tablet Take 10 mg by mouth daily.     clobetasol ointment (TEMOVATE) 0.05 % 1 application  every 14 (fourteen) days.     escitalopram (LEXAPRO) 10 MG tablet Take 1 tablet (10 mg total) by mouth daily. 90 tablet 1   Fluticasone-Umeclidin-Vilant (TRELEGY ELLIPTA) 100-62.5-25 MCG/ACT AEPB Inhale 1 puff into the lungs daily. 60 each 12   guaiFENesin (MUCINEX) 600 MG 12 hr tablet Take 600 mg by mouth at bedtime.     metFORMIN (GLUCOPHAGE-XR) 500 MG 24 hr tablet Take 1 tablet (500 mg total) by mouth daily with breakfast. 90 tablet 1   montelukast (SINGULAIR) 10 MG tablet Take 1 tablet (10 mg total) by mouth at bedtime. 90 tablet 3   omeprazole (PRILOSEC) 40 MG capsule Take 1 capsule (40 mg total) by mouth daily. 30 capsule 5  Semaglutide,0.25 or 0.5MG /DOS, (OZEMPIC, 0.25 OR 0.5 MG/DOSE,) 2 MG/1.5ML SOPN Inject 0.5 mg into the skin once a week. 6 mL 0   simvastatin (ZOCOR) 20 MG tablet TAKE 1 TABLET AT BEDTIME 90 tablet 1   TRUEplus Lancets 30G MISC TEST BLOOD SUGAR EVERY DAY 100 each 3   valACYclovir (VALTREX) 500 MG tablet TAKE 1 TABLET EVERY DAY 90 tablet 3   No facility-administered medications prior to visit.    Review of Systems  Constitutional:  Negative for chills, diaphoresis, fever, malaise/fatigue and weight loss.  HENT:  Negative for congestion.   Respiratory:  Positive for shortness of breath. Negative for  cough, hemoptysis, sputum production and wheezing.   Cardiovascular:  Negative for chest pain, palpitations and leg swelling.    Objective:   Vitals:   04/14/23 1023  BP: 124/68  Pulse: (!) 56  Resp: 18  SpO2: 94%  Weight: 231 lb (104.8 kg)  Height: 5\' 2"  (1.575 m)     SpO2: 94 % FiO2 (%): 91 %   Body mass index is 42.25 kg/m.  Physical Exam: General: Well-appearing, no acute distress HENT: [place], AT Eyes: EOMI, no scleral icterus Respiratory: Diminished but clear to auscultation bilaterally.  No crackles, wheezing or rales Cardiovascular: RRR, -M/R/G, no JVD Extremities:-Edema,-tenderness Neuro: AAO x4, CNII-XII grossly intact Psych: Normal mood, normal affect   Data Reviewed:  Imaging CT Chest lung screen 09/10/16 -moderate emphysema CT Chest lung screen 09/16/17-moderate emphysema, scattered subcentimeter pulmonary nodules.  Fatty liver with findings of cirrhosis. CT Chest lung screen 11/29/18- interval development of RUL nodule measuring 6.1 mm CT Chest 03/03/19 - slightly decreased RUL nodule measuring 5.3 mm Screening CT chest 03/26/20 - centrilobular emphysema. Scattered tiny pulmonary nodules, stable CTA 07/13/20 Stable pulmonary nodules VQ scan 04/02/2021-no pulmonary embolism CTA 09/13/2021-suboptimal contrast however radiology read concerning for filling defects in bilateral pulmonary arteries including segmental and right middle and lower lobe, lingula and left lower lobe.  Left lower lobe consolidation concerning for pulmonary infarct/pneumonia.  Background emphysema CTA PE 10/14/21 - Resolved segmental emboli. Resolved LLL consolidation with residual scarring. Moderate centrilobular emphysema. Cirrhosis. Splenomegaly 03/23/22 - No PE. Emphysema CT Chest lung screen 09/10/22 - New right lung apex nodule measuring 7.3 mm, multiple pulmonary nodules CT Chest lung screen 12/12/22 - Moderate centrilobular emphysema. New RUL nodule 7.6 mm. Prior 7.4 right lung apex nodule  decreased to 5.8 mm CT Chest Lung Screen 03/16/23 - Resolved RUL nodule. Largest 5 mm. No nodules. Emphysema  PFTs 08/28/11 FVC 2.71 [92%], FEV1 1.33 (62%), F/F 49, TLC 114%, DLCO 46% Moderate obstructive defect with moderate reduction in diffusion capacity. No bronchodilator response   11/08/15 FVC 2.05 [7%), FEV1 1.23 (55%), F/F 60 Moderate obstructive defect   09/24/16 FVC 2. (82%), FEV1 1.43 (62%), F/F 58 , TLC 99%, DLCO 54% Moderate obstruction with moderate diffusion defect. No bronchodilator response.   Labs A1AT 08/08/16- 162, PIMM  Sleep study Home sleep study 05/03/19 - AHI 8.8 Nadir SpO2 76% Overnight oximetry 09/24/21 - SpO2 <88% for 44 minutes and 32 seconds. Nadir SpO2 80%. Recommend to continue wearing 2L O2.   [facility] - DME HOME OXYGEN   SATURATION QUALIFICATIONS: (This note is used to comply with regulatory documentation for home oxygen)   Patient Saturations on Room Air at Rest = 93%   Patient Saturations on Room Air while Ambulating = 84%   Patient Saturations on 3 Liters of oxygen while Ambulating = 91%  Qualified for oxygen  Assessment & Plan:  77  year old female former smoker with COPD with emphysema, unprovoked PE on lifelong AC, allergic rhinitis, OSA not on CPAP, AS s/p TAVR 2022 who presents for follow-up. Well controlled on Trelegy. Last exacerbation in Sept 2023 and March 2024, so no exacerbation in >6 months. Discussed clinical course and management of COPDincluding bronchodilator regimen, preventive care including vaccinations and action plan for exacerbation.  Moderate COPD GOLD B - last exacerbation 08/2022 - well controlled --CONTINUE Trelegy 100 ONE puff ONCE a day.  --CONTINUE Albuterol AS NEEDED for shortness of breath or wheezing  Nocturnal hypoxemic respiratory failure secondary to mild OSA - increased O2 requirement from 2>3L Discussed CPAP vs oral appliance vs weight loss. Prefers to continue oxygen. No symptoms of fatigue, shortness of  breath, headaches. Overall asymptomatic. --CONTINUE 3L pulsed oxygen via nasal cannula with activity --CONTINUE 3L continuous oxygen nightly  --SpO2 >88%  Right upper lobe lung nodule - resolved Waxing and waning pulmonary nodules --CT Lung screen as scheduled  Acute pulmonary embolism, unprovoked --Hematology recommends lifelong anticoagulation on maintenance dosing --Eliquis 2.5 mg BID  Allergic Rhinitis --CONTINUE Zyrtec --CONTINUE Singulair --STOP Flonase due to nose bleeds. Call office for atrovent nasal spray if congestion recurs   Health maintenance  Immunization History  Administered Date(s) Administered   Fluad Quad(high Dose 65+) 04/07/2019, 04/26/2020, 05/08/2021, 03/25/2022   Fluad Trivalent(High Dose 65+) 04/14/2023   Influenza Split 05/28/2011   Influenza, High Dose Seasonal PF 04/07/2013, 05/15/2014, 05/30/2015, 03/25/2016, 03/05/2017, 04/01/2018   Influenza, Seasonal, Injecte, Preservative Fre 06/07/2012   PFIZER Comirnaty(Gray Top)Covid-19 Tri-Sucrose Vaccine 10/25/2020   PFIZER(Purple Top)SARS-COV-2 Vaccination 08/20/2019, 09/13/2019, 04/14/2020   Pfizer Covid-19 Vaccine Bivalent Booster 39yrs & up 05/08/2021   Pneumococcal Conjugate-13 07/06/2014   Pneumococcal Polysaccharide-23 12/15/2004, 05/28/2011, 05/27/2021   Rsv, Bivalent, Protein Subunit Rsvpref,pf Verdis Frederickson) 05/20/2022   Tdap 02/23/2008, 06/15/2018   Zoster Recombinant(Shingrix) 12/02/2017, 02/06/2018   Zoster, Live 05/17/2010   Annual CT Lung -Enrolled  Orders Placed This Encounter  Procedures   Flu Vaccine Trivalent High Dose (Fluad)   No orders of the defined types were placed in this encounter.  Return in about 6 months (around 10/13/2023).  I have spent a total time of 30-minutes on the day of the appointment including chart review, data review, collecting history, coordinating care and discussing medical diagnosis and plan with the patient/family. Past medical history, allergies,  medications were reviewed. Pertinent imaging, labs and tests included in this note have been reviewed and interpreted independently by me  Kathrina Crosley Mechele Collin, MD [facility] 04/14/2023 11:22 AM

## 2023-04-27 NOTE — Progress Notes (Unsigned)
04/28/2023 Shannon Schultz [id number] 1945-10-19  Referring provider: Joselyn Arrow, MD Primary GI doctor: Dr. Adela Lank  ASSESSMENT AND PLAN:   Cirrhosis most likely secondary to NASH Unmonitored cirrhosis with thrombocytopenia. No history of alcohol use. No recent imaging. No previous EGD screening. Possible ascites on exam.  -Order CT abdomen and pelvis with contrast with patient's symptoms of abdominal pain, nausea, diarrhea for [facility] screening and evaluation. -Order labs including ammonia and INR. Calculate MELD -No evidence of Hg on exam -Patient is high risk for endoscopic evaluation pending CT and labs will consider setting patient up for EGD evaluation in the hospital due to oxygen dependence with or without colonoscopy.  Chronic Constipation-on Ozempic Chronic constipation with recent change in bowel habits since May 2024. Reports hard, pellet-like stools daily with occasional loose stools. No recent antibiotics. Rectal exam revealed hard stool in rectum. Positive fecal occult blood test, likely due to internal hemorrhoids. -Very suspicious for overflow fecal incontinence with exam -Start MiraLAX with fiber to improve bowel movements. -Order abdominal and pelvic CT scan with contrast to evaluate intestines and liver. Prednisone and Benadryl to be taken prior to procedure due to allergy to dye. -Order labs to evaluate for inflammation -Last colonoscopy 2017, will consider repeat colonoscopy pending labs and CT.  Gastroesophageal Reflux Disease (GERD) Poorly controlled GERD with infrequent use of omeprazole. Reports tasting previous meals and occasional nausea. -Encourage consistent use of omeprazole 40mg  daily. -Weight loss -Lifestyle changes given to the patient -Possible gastroparesis component, diet given  Pulmonary Embolism (PE) History of PE in March 2023. Currently on Eliquis 2.5mg  twice daily.  Chronic Obstructive Pulmonary Disease (COPD) with Sleep Apnea On 2  liters of oxygen at night. No CPAP. Reports occasional shortness of breath with exertion. -Continue current management.  Follow-up in 6-8 weeks to review results of imaging and labs. Depending on results, may need to schedule EGD and colonoscopy in hospital setting due to oxygen dependence.    Patient Care Team: Joselyn Arrow, MD as PCP - General (Family Medicine) Croitoru, Rachelle Hora, MD as PCP - Cardiology (Cardiology) Joselyn Arrow, MD as Referring Physician (Family Medicine) Verner Chol, [facility] (Inactive) as Pharmacist (Pharmacist) Raymondo Band as Physician Assistant (Hematology and Oncology) Charna Elizabeth, MD as Consulting Physician (Gastroenterology) Griselda Miner, MD as Consulting Physician (General Surgery) Heloise Purpura, MD as Consulting Physician (Urology) Marcene Corning, MD as Consulting Physician (Orthopedic Surgery) Luciano Cutter, MD as Consulting Physician (Pulmonary Disease) Samson Frederic, MD as Consulting Physician (Orthopedic Surgery) Yolonda Kida, MD as Consulting Physician (Orthopedic Surgery)  HISTORY OF PRESENT ILLNESS: 77 y.o. female with a past medical history of status post TAVR 2022, most recent echo 09/13/2021 shows normal structure of aortic valve prosthesis, EF 55 to 60%, COPD, aortic atherosclerosis, PE 09/13/2021 on Eliquis 2.5 mg twice daily, bladder cancer 2018, right breast cancer 1992, cirrhosis noted 08/2016 with thrombocytopenia, hypothyroidism, sleep apnea on 2 L oxygen at night and others listed below presents for evaluation of abdominal pain and swelling.   Patient previously followed by Dr. Loreta Ave.  Last office visit there was 2017 for diverticulosis, NASH, personal history of colon polyps, ventral hernia  01/23/2016 colonoscopy showed diverticula sigmoid colon, small internal hemorrhoids otherwise unremarkable recall 7 years. 03/27/2023 labs reviewed show no anemia no leukocytosis patient does have a chronic thrombocytopenia from 2023,  liver function unremarkable normal kidney 2023 CT chest showed cirrhotic liver with nodular contours  Discussed the use of AI scribe software for clinical note  transcription with the patient, who gave verbal consent to proceed.  The patient, with a history of cirrhosis, heart valve replacement, pulmonary embolism, and sleep apnea requiring nocturnal oxygen, presents with concerns of altered bowel habits. She reports a change in bowel movements since May, with a shift from watery diarrhea to small, hard, pellet-like stools. The patient describes the stools as being accompanied by significant gas and discomfort. She has been taking Gas-X nightly, which she reports has helped with the loose bowel movements.  The patient also reports occasional nausea, but denies vomiting. She has a history of acid reflux, which she reports is not well controlled, and she often forgets to take her prescribed omeprazole. She denies any recent weight loss, trouble swallowing, or dark/black stools. She does report some abdominal discomfort, which she describes as feeling better if she could vomit. She is on metformin and a weekly injectable, Ozempic, for diabetes management. She reports no adverse effects from these medications.   The patient has a history of cirrhosis, but reports no recent imaging or specialist follow-up for this condition. She denies any history of alcohol use. She also has a history of heart valve replacement and pulmonary embolism, for which she is on Eliquis. She uses oxygen at night for sleep apnea.  The patient also reports occasional shortness of breath with exertion, which she attributes to her COPD. She denies any recent changes in her breathing. She also reports occasional swelling in her right leg, but denies any abdominal swelling or distention. She has a history of eczema and reports no recent antibiotic use.      She  reports that she quit smoking about 11 years ago. Her smoking use included  cigarettes. She started smoking about 57 years ago. She has a 46 pack-year smoking history. She has never used smokeless tobacco. She reports that she does not drink alcohol and does not use drugs.  RELEVANT LABS AND IMAGING:  Results   DIAGNOSTIC Colonoscopy: Internal hemorrhoids, diverticula (2017) Echocardiography: Normal (2023) Rectal exam: Positive for blood, internal hemorrhoids, hard stool (04/28/2023)      CBC    Component Value Date/Time   WBC 5.2 03/27/2023 1112   WBC 8.0 03/24/2022 1203   RBC 4.57 03/27/2023 1112   HGB 13.8 03/27/2023 1112   HGB 13.4 09/23/2021 1317   HCT 41.9 03/27/2023 1112   HCT 39.1 09/23/2021 1317   PLT 122 (L) 03/27/2023 1112   PLT 148 (L) 09/23/2021 1317   MCV 91.7 03/27/2023 1112   MCV 89 09/23/2021 1317   MCH 30.2 03/27/2023 1112   MCHC 32.9 03/27/2023 1112   RDW 15.1 03/27/2023 1112   RDW 13.4 09/23/2021 1317   LYMPHSABS 0.9 03/27/2023 1112   LYMPHSABS 1.0 09/23/2021 1317   MONOABS 0.3 03/27/2023 1112   EOSABS 0.1 03/27/2023 1112   EOSABS 0.1 09/23/2021 1317   BASOSABS 0.1 03/27/2023 1112   BASOSABS 0.1 09/23/2021 1317   Recent Labs    09/25/22 1049 03/27/23 1112  HGB 13.1 13.8    CMP     Component Value Date/Time   NA 139 03/27/2023 1112   NA 140 12/09/2021 0855   K 4.0 03/27/2023 1112   CL 106 03/27/2023 1112   CO2 28 03/27/2023 1112   GLUCOSE 158 (H) 03/27/2023 1112   BUN 13 03/27/2023 1112   BUN 15 12/09/2021 0855   CREATININE 0.70 03/27/2023 1112   CREATININE 0.75 03/05/2017 0736   CALCIUM 9.1 03/27/2023 1112   PROT 6.9 03/27/2023 1112  PROT 6.2 12/09/2021 0855   ALBUMIN 3.9 03/27/2023 1112   ALBUMIN 4.0 12/09/2021 0855   AST 21 03/27/2023 1112   ALT 12 03/27/2023 1112   ALKPHOS 62 03/27/2023 1112   BILITOT 1.2 03/27/2023 1112   GFRNONAA >60 03/27/2023 1112   GFRAA 97 07/02/2020 1149      Latest Ref Rng & Units 03/27/2023   11:12 AM 09/25/2022   10:49 AM 03/19/2022   10:06 AM  Hepatic Function  Total  Protein 6.5 - 8.1 g/dL 6.9  6.8  6.3   Albumin 3.5 - 5.0 g/dL 3.9  3.9  3.9   AST 15 - 41 U/L 21  21  24    ALT 0 - 44 U/L 12  11  13    Alk Phosphatase 38 - 126 U/L 62  65  55   Total Bilirubin 0.3 - 1.2 mg/dL 1.2  1.1  0.7       Current Medications:   Current Outpatient Medications (Endocrine & Metabolic):    metFORMIN (GLUCOPHAGE-XR) 500 MG 24 hr tablet, Take 1 tablet (500 mg total) by mouth daily with breakfast.   Semaglutide,0.25 or 0.5MG /DOS, (OZEMPIC, 0.25 OR 0.5 MG/DOSE,) 2 MG/1.5ML SOPN, Inject 0.5 mg into the skin once a week.   predniSONE (DELTASONE) 50 MG tablet, Give 13 hours, 7 hours, and 1 hour prior to contrast dye injection. Notify pharmacy to schedule dose as appropriate based on procedure date/time.  Current Outpatient Medications (Cardiovascular):    bisoprolol-hydrochlorothiazide (ZIAC) 10-6.25 MG tablet, TAKE 1 TABLET EVERY DAY   simvastatin (ZOCOR) 20 MG tablet, TAKE 1 TABLET AT BEDTIME  Current Outpatient Medications (Respiratory):    albuterol (PROVENTIL) (2.5 MG/3ML) 0.083% nebulizer solution, Take 3 mLs (2.5 mg total) by nebulization every 6 (six) hours as needed for wheezing or shortness of breath.   albuterol (VENTOLIN HFA) 108 (90 Base) MCG/ACT inhaler, Inhale 2 puffs into the lungs every 6 (six) hours as needed for wheezing or shortness of breath.   azelastine (ASTELIN) 0.1 % nasal spray, Place 2 sprays into both nostrils 2 (two) times daily. Use in each nostril as directed   cetirizine (ZYRTEC) 10 MG tablet, Take 10 mg by mouth daily.   Fluticasone-Umeclidin-Vilant (TRELEGY ELLIPTA) 100-62.5-25 MCG/ACT AEPB, Inhale 1 puff into the lungs daily.   guaiFENesin (MUCINEX) 600 MG 12 hr tablet, Take 600 mg by mouth at bedtime.   montelukast (SINGULAIR) 10 MG tablet, Take 1 tablet (10 mg total) by mouth at bedtime.  Current Outpatient Medications (Analgesics):    acetaminophen (TYLENOL) 500 MG tablet, Take 500-1,000 mg by mouth every 6 (six) hours as needed for  moderate pain or headache.  Current Outpatient Medications (Hematological):    apixaban (ELIQUIS) 2.5 MG TABS tablet, Take 1 tablet (2.5 mg total) by mouth 2 (two) times daily.  Current Outpatient Medications (Other):    ACCU-CHEK AVIVA PLUS test strip, TEST BLOOD SUGAR EVERY DAY   Alcohol Swabs (B-D SINGLE USE SWABS REGULAR) PADS, Use twice a day when checking blood sugars   clobetasol ointment (TEMOVATE) 0.05 %, 1 application  every 14 (fourteen) days.   escitalopram (LEXAPRO) 10 MG tablet, Take 1 tablet (10 mg total) by mouth daily.   omeprazole (PRILOSEC) 40 MG capsule, Take 1 capsule (40 mg total) by mouth daily.   TRUEplus Lancets 30G MISC, TEST BLOOD SUGAR EVERY DAY   valACYclovir (VALTREX) 500 MG tablet, TAKE 1 TABLET EVERY DAY  Medical History:  Past Medical History:  Diagnosis Date   Arthritis    "  hands" (05/15/2017)   Bladder cancer (HCC) 2018   Breast cancer, right (HCC) 1992   DCIS,bladder ca (just dx)   Colon polyp    Complication of anesthesia 1992   "local anesthesia" used was hard to awaken from-no problems since (05/15/2017)   COPD (chronic obstructive pulmonary disease) (HCC)    Coronary artery disease    COVID-19 virus infection 05/05/2019   Diverticulosis    Elevated liver enzymes    fatty liver per ultrasound per pt   FHx: BRCA2 gene positive    sister with BRCA2 mutation (pt tested NEGATIVE)   HLD (hyperlipidemia)    HSV (herpes simplex virus) infection    on hip--on daily suppression   Hypertension    Hypothyroidism    took med 7 yrs after birth of 1st child   Impaired glucose tolerance    Migraine    Nonalcoholic steatohepatitis (NASH) 03/23/2022   On home oxygen therapy    "have it available but I'm not using it" (05/15/2017)   Osteopenia    Personal history of breast cancer 11/15/2021   S/P TAVR (transcatheter aortic valve replacement) 08/14/2020   s/p TAVR with a 26 mm Edwards S3U via the left subclavian approach by Dr. Cornelius Moras and Dr. Clifton James    Severe aortic stenosis    Severe obesity (BMI >= 40) (HCC) 10/06/2014   Sleep apnea    Sleeps with 2 L O2 @ night   Type 2 diabetes mellitus (HCC)    Urothelial cancer (HCC) 03/10/2017   Incidental bladder mass noted on CT, papillary urothelial CA on pathology from excision 12/2016; post-op instillation of chemo. Due for f/u cystoscopy 03/2017   Venous insufficiency 09/10/2018   Ventral hernia    Ventral hernia without obstruction or gangrene 05/15/2017   Vitamin D deficiency disease    Allergies:  Allergies  Allergen Reactions   Contrast Media [Iodinated Contrast Media] Anaphylaxis, Shortness Of Breath and Other (See Comments)    Could not breath   Iohexol Anaphylaxis, Shortness Of Breath and Other (See Comments)    Immediately could not breathe   Lisinopril Anaphylaxis, Shortness Of Breath and Rash   Sulfa Antibiotics Anaphylaxis and Other (See Comments)    Historical from mother, pt states that mother says she almost died from this drug   Latex Other (See Comments)    Unless against on skin for a long time, blisters. Short term is okay.   Codeine Nausea Only, Anxiety and Other (See Comments)    insomnia   Levofloxacin Other (See Comments)    insomnia   Lipitor [Atorvastatin Calcium] Rash   Meloxicam Rash    Broke out in a rash on her stomach,back, legs, and behind ears.   Nickel Other (See Comments)    With earrings pt has soreness and drainage from piercing   Vicodin [Hydrocodone-Acetaminophen] Itching     Surgical History:  She  has a past surgical history that includes Colonoscopy (2009, 08/2010, 12/2015); Breast lumpectomy with radioactive seed localization (Left, 12/22/2016); Transurethral resection of bladder tumor with mitomycin-c (N/A, 01/12/2017); Cystoscopy w/ retrogrades (Bilateral, 01/12/2017); Cystoscopy (04/23/2017); Laparoscopic cholecystectomy (2003); Hernia repair; Umbilical hernia repair (2003); Abdominal hernia repair (05/15/2017); Breast biopsy (Right, 1992);  Breast biopsy (Left, 2018); Tympanostomy tube placement (Bilateral, 1980s); Ventral hernia repair (N/A, 05/15/2017); Insertion of mesh (N/A, 05/15/2017); Breast excisional biopsy (Left, 2018); Mastectomy (Right, 1992); Cataract extraction, bilateral (Bilateral, R 03/16/2019 L 03/30/2019); Femur IM nail (Right, 08/24/2019); RIGHT/LEFT HEART CATH AND CORONARY ANGIOGRAPHY (N/A, 07/05/2020); Eye surgery; Cardiac catheterization; TEE  without cardioversion (N/A, 08/14/2020); and Endovenous ablation saphenous vein w/ laser (Right, 01/09/2022). Family History:  Her family history includes Allergies in her sister and sister; Asthma in her sister and sister; Breast cancer (age of onset: 57) in her sister; Cancer in her maternal grandmother and another family member; Diabetes in her father; Fibromyalgia in her sister and sister; Hashimoto's thyroiditis in her sister and sister; Heart disease in her father and mother; Hyperlipidemia in her sister and sister; Hypertension in her father; Other in her niece; Skin cancer in her sister; Stroke (age of onset: 63) in her paternal grandmother; Tuberculosis in her maternal grandfather.  REVIEW OF SYSTEMS  : All other systems reviewed and negative except where noted in the History of Present Illness.  PHYSICAL EXAM: BP 130/70 (BP Location: Left Arm, Patient Position: Sitting, Cuff Size: Large)   Pulse 75   Ht 5\' 2"  (1.575 m)   Wt 231 lb 6 oz (105 kg)   LMP  (LMP Unknown)   BMI 42.32 kg/m  General Appearance: Obese, chronically ill in no apparent distress. Head:   Normocephalic and atraumatic. Eyes:  sclerae anicteric,conjunctive pink  Respiratory: Diffuse decreased breath sounds without rales, rhonchi, wheezing. Cardio: RRR with systolic murmur peripheral pulses intact.  Abdomen: Soft,  Obese ,active bowel sounds. mild tenderness in the lower abdomen. Without guarding and Without rebound. No masses.  Possible shifting dullness no fluid wave very obese. Rectal: Normal  external rectal exam, decreased rectal tone, appreciated internal hemorrhoids, non-tender, no masses, large volume hard brown stool, hemoccult Positive Musculoskeletal: Full ROM, antalgic gait, able to get on table with some difficulty With edema. Skin: No jaundice dry and intact without significant lesions or rashes Neuro: Alert and  oriented x4;  No focal deficits.  No asterixis Psych:  Cooperative. Normal mood and affect.    Doree Albee, PA-C 3:37 PM

## 2023-04-28 ENCOUNTER — Ambulatory Visit: Payer: Medicare HMO | Admitting: Physician Assistant

## 2023-04-28 ENCOUNTER — Other Ambulatory Visit (INDEPENDENT_AMBULATORY_CARE_PROVIDER_SITE_OTHER): Payer: Medicare HMO

## 2023-04-28 ENCOUNTER — Encounter: Payer: Self-pay | Admitting: Physician Assistant

## 2023-04-28 VITALS — BP 130/70 | HR 75 | Ht 62.0 in | Wt 231.4 lb

## 2023-04-28 DIAGNOSIS — Z9981 Dependence on supplemental oxygen: Secondary | ICD-10-CM | POA: Diagnosis not present

## 2023-04-28 DIAGNOSIS — R1084 Generalized abdominal pain: Secondary | ICD-10-CM

## 2023-04-28 DIAGNOSIS — Z7901 Long term (current) use of anticoagulants: Secondary | ICD-10-CM

## 2023-04-28 DIAGNOSIS — Z91041 Radiographic dye allergy status: Secondary | ICD-10-CM | POA: Diagnosis not present

## 2023-04-28 DIAGNOSIS — K7460 Unspecified cirrhosis of liver: Secondary | ICD-10-CM | POA: Diagnosis not present

## 2023-04-28 DIAGNOSIS — D696 Thrombocytopenia, unspecified: Secondary | ICD-10-CM

## 2023-04-28 DIAGNOSIS — R197 Diarrhea, unspecified: Secondary | ICD-10-CM | POA: Diagnosis not present

## 2023-04-28 DIAGNOSIS — G4733 Obstructive sleep apnea (adult) (pediatric): Secondary | ICD-10-CM | POA: Diagnosis not present

## 2023-04-28 DIAGNOSIS — Z952 Presence of prosthetic heart valve: Secondary | ICD-10-CM

## 2023-04-28 DIAGNOSIS — J449 Chronic obstructive pulmonary disease, unspecified: Secondary | ICD-10-CM

## 2023-04-28 LAB — COMPREHENSIVE METABOLIC PANEL
ALT: 12 U/L (ref 0–35)
AST: 17 U/L (ref 0–37)
Albumin: 3.9 g/dL (ref 3.5–5.2)
Alkaline Phosphatase: 61 U/L (ref 39–117)
BUN: 16 mg/dL (ref 6–23)
CO2: 28 meq/L (ref 19–32)
Calcium: 9.1 mg/dL (ref 8.4–10.5)
Chloride: 104 meq/L (ref 96–112)
Creatinine, Ser: 0.64 mg/dL (ref 0.40–1.20)
GFR: 85.54 mL/min (ref >60.00)
Glucose, Bld: 100 mg/dL — ABNORMAL HIGH (ref 70–99)
Potassium: 3.7 meq/L (ref 3.5–5.1)
Sodium: 138 meq/L (ref 135–145)
Total Bilirubin: 1.1 mg/dL (ref 0.2–1.2)
Total Protein: 6.8 g/dL (ref 6.0–8.3)

## 2023-04-28 LAB — CBC WITH DIFFERENTIAL/PLATELET
Basophils Absolute: 0.1 10*3/uL (ref 0.0–0.1)
Basophils Relative: 1.2 % (ref 0.0–3.0)
Eosinophils Absolute: 0.1 10*3/uL (ref 0.0–0.7)
Eosinophils Relative: 1.8 % (ref 0.0–5.0)
HCT: 43.2 % (ref 36.0–46.0)
Hemoglobin: 13.7 g/dL (ref 12.0–15.0)
Lymphocytes Relative: 16.9 % (ref 12.0–46.0)
Lymphs Abs: 1 10*3/uL (ref 0.7–4.0)
MCHC: 31.8 g/dL (ref 30.0–36.0)
MCV: 91 fL (ref 78.0–100.0)
Monocytes Absolute: 0.4 10*3/uL (ref 0.1–1.0)
Monocytes Relative: 6.3 % (ref 3.0–12.0)
Neutro Abs: 4.2 10*3/uL (ref 1.4–7.7)
Neutrophils Relative %: 73.8 % (ref 43.0–77.0)
Platelets: 131 10*3/uL — ABNORMAL LOW (ref 150.0–400.0)
RBC: 4.75 Mil/uL (ref 3.87–5.11)
RDW: 16 % — ABNORMAL HIGH (ref 11.5–15.5)
WBC: 5.6 10*3/uL (ref 4.0–10.5)

## 2023-04-28 LAB — SEDIMENTATION RATE: Sed Rate: 14 mm/h (ref 0–30)

## 2023-04-28 LAB — TSH: TSH: 2.63 u[IU]/mL (ref 0.35–5.50)

## 2023-04-28 LAB — PROTIME-INR
INR: 1.4 {ratio} — ABNORMAL HIGH (ref 0.8–1.0)
Prothrombin Time: 14.7 s — ABNORMAL HIGH (ref 9.6–13.1)

## 2023-04-28 MED ORDER — PREDNISONE 50 MG PO TABS: ORAL_TABLET | ORAL | 0 refills | Status: DC

## 2023-04-28 NOTE — Patient Instructions (Signed)
Your provider has requested that you go to the basement level for lab work before leaving today. Press "B" on the elevator. The lab is located at the first door on the left as you exit the elevator. You have been scheduled for a CT scan of the abdomen and pelvis at [facility], 1st floor Radiology. You are scheduled on 10/30 at 5 pm. You should arrive at 4:30 pm to your appointment time for registration.  You may take any medications as prescribed with a small amount of water, if necessary. If you take any of the following medications: METFORMIN, GLUCOPHAGE, GLUCOVANCE, AVANDAMET, RIOMET, FORTAMET, ACTOPLUS MET, JANUMET, GLUMETZA or METAGLIP, you MAY be asked to HOLD this medication 48 hours AFTER the exam.   The purpose of you drinking the oral contrast is to aid in the visualization of your intestinal tract. The contrast solution may cause some diarrhea. Depending on your individual set of symptoms, you may also receive an intravenous injection of x-ray contrast/dye. Plan on being at [facility] for 45 minutes or longer, depending on the type of exam you are having performed.   If you have any questions regarding your exam or if you need to reschedule, you may call [facility] at [phone] between the hours of 8:00 am and 5:00 pm, Monday-Friday.    Prednisone 50 mg at 13 hours prior to procedure, 7 hours prior to procedure, and 1 hour prior to procedure.  Benadryl 50 mg 1 hour prior to procedure.    I believe your loose stools may be due to something called overflow diarrhea. If you predominantly have constipation, straining or incomplete bowel movements at times the only thing to get through the large amounts of stool is liquid. This can cause someone to feel like they are having diarrhea yet actually they have too much backup of stool. We can evaluate this with a rectal exam, and x-ray of your abdomen, or CT scan. If this is the case usually doing a combination of Benefiber  1-2 times daily with MiraLAX 17 g daily can be helpful.  At times there are medications that can also be helpful for this but generally I start with over-the-counter I also like somebody to get a squatty potty to help straighten out the colon whether having a bowel movement. Sometimes we will schedule for pelvic floor physical therapy if you are having incomplete bowel movements and failure pelvic floor is weak and contributing to the constipation.  Benefiber or Citracel is good for constipation/diarrhea/irritable bowel syndrome, it helps with weight loss and can help lower your bad cholesterol. Please do 1 TBSP in the morning in water, coffee, or tea up to twice a day. It can take up to a month before you can see a difference with your bowel movements. It is cheapest from [employer], [employer], [employer].   Miralax is an osmotic laxative.  It only brings more water into the stool.  This is safe to take daily.  Can take up to 17 gram of miralax twice a day.  Mix with juice or coffee.  Start 1 capful at night for 3-4 days and reassess your response in 3-4 days.  You can increase and decrease the dose based on your response.  Remember, it can take up to 3-4 days to take effect OR for the effects to wear off.   I often pair this with benefiber in the morning to help assure the stool is not too loose.   You should have  an imaging study every 6 months to monitor for the development of hepatocellular carcinoma (liver cancer). The risk is low, but, if liver cancer is diagnosed early, there are better treatment options. Will get AFP, INR, CBC, and CMET.  Will follow up in 6 months to check labs and evaluate.   I recommend a high-protein, primarily plant-based diet. Avoid red meat. Work to maintain a health weight. Weigh yourself daily- call if you have weight gain of greater than 5 lbs in a 1-2 days, leg swelling, or new swelling in your abdomen. Minimize salt intake- VERY important. Please do not consume  more than 2000 mg of sodium every day. Monitor your blood pressure at home.  Stay active. Weight-based exercise for 30 minutes at least 3 days a week is recommended. I recommend that you not drink any alcohol including beer, wine, liquor, and non-alcoholic beer.   You are at increased risk of osteopenia and osteoporosis. You should be screened for these metabolic bone diseases if you have not already had the testing performed.  Cirrhosis Cirrhosis is long-term (chronic) liver injury. The liver is the body's largest internal organ, and it performs many functions. It converts food into energy, removes toxic material from the blood, makes important proteins, and absorbs necessary vitamins from food. In cirrhosis, healthy liver cells are replaced by scar tissue. This prevents blood from flowing through the liver and makes it difficult for the liver to complete its functions. What are the causes? Common causes of this condition are hepatitis C and long-term alcohol abuse. Other causes include: Nonalcoholic fatty liver disease (NAFLD). This happens when fat is deposited in the liver by causes other than alcohol. Hepatitis B infection. Autoimmune hepatitis. In this condition, the body's defense system (immune system) mistakenly attacks the liver cells, causing inflammation. Diseases that cause blockage of ducts inside the liver. Inherited liver diseases, such as hemochromatosis. This is one of the most common inherited liver diseases. In this disease, deposits of iron collect in the liver and other organs. Reactions to certain long-term medicines, such as amiodarone, a heart medicine. Parasitic infections. These include schistosomiasis, which is caused by a flatworm. Long-term contact to certain toxins. These toxins include certain organic solvents, such as toluene and chloroform. What increases the risk? You are more likely to develop this condition if: You have certain types of viral  hepatitis. You abuse alcohol, especially if you are female. You are overweight. You use IV drugs and share needles. You have unprotected sex with someone who has viral hepatitis. What are the signs or symptoms? You may not have any signs and symptoms at first. Symptoms may not develop until the damage to your liver starts to get worse. Early symptoms may include: Weakness and tiredness (fatigue). Changes in sleep patterns or having trouble sleeping. Itchiness. Tenderness in the right-upper part of your abdomen. Weight loss and muscle loss. Nausea. Loss of appetite. Later symptoms may include: Fatigue or weakness that is getting worse. Yellow skin and eyes (jaundice). Buildup of fluid in the abdomen (ascites). You may notice that your clothes are tight around your waist. Weight gain and swelling of the feet and ankles (edema). Trouble breathing. Easy bruising and bleeding. Vomiting blood, or black or bloody stool. Mental confusion. How is this diagnosed? Your health care provider may suspect cirrhosis based on your symptoms and medical history, especially if you have other medical conditions or a history of alcohol abuse. Your health care provider will do a physical exam to feel your liver  and to check for signs of cirrhosis. Tests may include: Blood tests to check: For hepatitis B or C. Kidney function. Liver function. Imaging tests such as: MRI or CT scan to look for changes seen in advanced cirrhosis. Ultrasound to see if normal liver tissue is being replaced by scar tissue. A procedure in which a long needle is used to take a sample of liver tissue to be checked in a lab (biopsy). Liver biopsy can confirm the diagnosis of cirrhosis. How is this treated? Treatment for this condition depends on how damaged your liver is and what caused the damage. It may include treating the symptoms of cirrhosis, or treating the underlying causes to slow the damage. Treatment may include: Making  lifestyle changes, such as: Eating a healthy diet. You may need to work with your health care provider or a dietitian to develop an eating plan. Restricting salt intake. Maintaining a healthy weight. Not abusing drugs or alcohol. Taking medicines to: Treat liver infections or other infections. Control itching. Reduce fluid buildup. Reduce certain blood toxins. Reduce risk of bleeding from enlarged blood vessels in the stomach or esophagus (varices). Liver transplant. In this procedure, a liver from a donor is used to replace your diseased liver. This is done if cirrhosis has caused liver failure. Other treatments and procedures may be done depending on the problems that you get from cirrhosis. Common problems include liver-related kidney failure (hepatorenal syndrome). Follow these instructions at home:  Take medicines only as told by your health care provider. Do not use medicines that are toxic to your liver. Ask your health care provider before taking any new medicines, including over-the-counter medicines such as NSAIDs. Rest as needed. Eat a well-balanced diet. Limit your salt or water intake, if your health care provider asks you to do this. Do not drink alcohol. This is especially important if you routinely take acetaminophen. Keep all follow-up visits. This is important. Contact a health care provider if you: Have fatigue or weakness that is getting worse. Develop swelling of the hands, feet, or legs, or a buildup of fluid in the abdomen (ascites). Have a fever or chills. Develop loss of appetite. Have nausea or vomiting. Develop jaundice. Develop easy bruising or bleeding. Get help right away if you: Vomit bright red blood or a material that looks like coffee grounds. Have blood in your stools. Notice that your stools appear black and tarry. Become confused. Have chest pain or trouble breathing. These symptoms may represent a serious problem that is an emergency. Do not  wait to see if the symptoms will go away. Get medical help right away. Call your local emergency services (911 in the [place] Do not drive yourself to the hospital. Summary Cirrhosis is chronic liver injury. Common causes are hepatitis C and long-term alcohol abuse. Tests used to diagnose cirrhosis include blood tests, imaging tests, and liver biopsy. Treatment for this condition involves treating the underlying cause. Avoid alcohol, drugs, salt, and medicines that may damage your liver. Get help right away if you vomit bright red blood or a material that looks like coffee grounds. This information is not intended to replace advice given to you by your health care provider. Make sure you discuss any questions you have with your health care provider. Document Revised: 03/29/2020 Document Reviewed: 03/29/2020 Elsevier Patient Education  2022 [employer].

## 2023-04-28 NOTE — Progress Notes (Signed)
Agree with assessment and plan as outlined.  

## 2023-04-29 ENCOUNTER — Ambulatory Visit (HOSPITAL_COMMUNITY)
Admission: RE | Admit: 2023-04-29 | Discharge: 2023-04-29 | Disposition: A | Discharge status: Home / Self Care | Payer: Medicare HMO | Source: Ambulatory Visit | Attending: Physician Assistant | Admitting: Physician Assistant

## 2023-04-29 DIAGNOSIS — R1084 Generalized abdominal pain: Secondary | ICD-10-CM | POA: Insufficient documentation

## 2023-04-29 DIAGNOSIS — K5730 Diverticulosis of large intestine without perforation or abscess without bleeding: Secondary | ICD-10-CM | POA: Diagnosis not present

## 2023-04-29 DIAGNOSIS — R161 Splenomegaly, not elsewhere classified: Secondary | ICD-10-CM | POA: Diagnosis not present

## 2023-04-29 DIAGNOSIS — K7460 Unspecified cirrhosis of liver: Secondary | ICD-10-CM | POA: Diagnosis not present

## 2023-04-29 DIAGNOSIS — K766 Portal hypertension: Secondary | ICD-10-CM | POA: Diagnosis not present

## 2023-04-29 LAB — AMMONIA: Ammonia: 24 umol/L (ref <=72)

## 2023-04-29 MED ORDER — IOHEXOL 300 MG/ML  SOLN
100.0000 mL | Freq: Once | INTRAMUSCULAR | Status: AC | PRN
  Administered 2023-04-29: 100 mL via INTRAVENOUS

## 2023-04-30 LAB — AFP TUMOR MARKER: AFP-Tumor Marker: 3.1 ng/mL

## 2023-05-01 ENCOUNTER — Encounter: Payer: Self-pay | Admitting: *Deleted

## 2023-05-01 ENCOUNTER — Telehealth: Payer: Self-pay | Admitting: *Deleted

## 2023-05-01 ENCOUNTER — Other Ambulatory Visit: Payer: Self-pay | Admitting: *Deleted

## 2023-05-01 DIAGNOSIS — R195 Other fecal abnormalities: Secondary | ICD-10-CM

## 2023-05-01 MED ORDER — NA SULFATE-K SULFATE-MG SULF 17.5-3.13-1.6 GM/177ML PO SOLN: ORAL | 0 refills | Status: DC

## 2023-05-01 NOTE — Telephone Encounter (Signed)
   Shannon Schultz 1945-10-24 [id number]  Dear Dr Everardo All:  We have scheduled the above named patient for an endoscopy and colonoscopy procedure. Our records show that she is on anticoagulation therapy.  Please advise as to whether the patient may come off their therapy of Eliquis 2 days prior to their procedure which is scheduled for 07/20/23.  Please route your response to Vernia Buff, RN  Sincerely,    [facility]

## 2023-05-06 NOTE — Telephone Encounter (Signed)
Left message for patient to call back  

## 2023-05-06 NOTE — Telephone Encounter (Signed)
For patient's history of pulmonary embolism, she is currently on maintenance dosing of Eliquis. OK to hold eliquis two days (48 hours) prior to procedure and recommending resuming 24 hours after procedure is completed as long as she is not having any active issues with bleeding.

## 2023-05-06 NOTE — Telephone Encounter (Signed)
I have spoken with patient to advise that per Dr Everardo All, she may hold Eliquis 2 days prior to her upcoming procedure with Dr Adela Lank. Patient verbalizes understanding of this information.

## 2023-05-12 DIAGNOSIS — C678 Malignant neoplasm of overlapping sites of bladder: Secondary | ICD-10-CM | POA: Diagnosis not present

## 2023-05-12 DIAGNOSIS — R8289 Other abnormal findings on cytological and histological examination of urine: Secondary | ICD-10-CM | POA: Diagnosis not present

## 2023-05-12 DIAGNOSIS — R8271 Bacteriuria: Secondary | ICD-10-CM | POA: Diagnosis not present

## 2023-05-13 ENCOUNTER — Other Ambulatory Visit: Payer: Self-pay | Admitting: Internal Medicine

## 2023-05-23 ENCOUNTER — Other Ambulatory Visit: Payer: Self-pay | Admitting: Family Medicine

## 2023-06-01 ENCOUNTER — Other Ambulatory Visit: Payer: Self-pay | Admitting: Family Medicine

## 2023-06-01 DIAGNOSIS — E7800 Pure hypercholesterolemia, unspecified: Secondary | ICD-10-CM

## 2023-06-01 DIAGNOSIS — F411 Generalized anxiety disorder: Secondary | ICD-10-CM

## 2023-06-01 DIAGNOSIS — B009 Herpesviral infection, unspecified: Secondary | ICD-10-CM

## 2023-06-02 NOTE — Progress Notes (Unsigned)
No chief complaint on file.   Shannon Schultz is a 77 y.o. female who presents for annual physical exam, Medicare wellness visit and follow-up on chronic medical conditions.     COPD: She remains under the care of Dr. Everardo All, last seen in 03/2023. She hasn't had any recent exacerbations, last hospitalization was 02/2022.  She is compliant with daily use of Trelegy. Rarely needs albuterol. She has some shortness of breath with moderate exertion. Denies cough, wheezing. Exercise is limited.   She continues on oxygen, using only at night (she prefers this to using CPAP).    H/o bilateral pulmonary embolism. She is under care of hematology, who recommends lifelong anticoagulation. Dose of eliquis was lowered 02/2022 to 2.5mg  BID. She denies bleeding, chest pain, shortness of breath. Bruising on forearms only.   She is getting regular lung cancer screening with CT. In March, she was found to have a new RUL nodule.  This was repeated in June, and last CT was in 03/2023: IMPRESSION: 1. Lung-RADS 2, benign appearance or behavior. Continue annual screening with low-dose chest CT without contrast in 12 months. 2. Cirrhosis. 3. Aortic atherosclerosis (ICD10-I70.0). Coronary artery calcification. 4. Enlarged pulmonic trunk, indicative of pulmonary arterial hypertension. 5.  Emphysema (ICD10-J43.9).    Anxiety:  She was started on lexapro in 12/2021 when feeling very overwhelmed/anxious about her health issues (COPD, dry macular degeneration).  She has also felt that her eczema flares are triggered by anxiety.   She has noted significant improvement in her anxiety since taking the lexapro. Denies side effects. She continues to do well on this.    Eczema:  This had improved some after starting treatment for anxiety, but still has a lot of flares.  She was encouraged at her last visit to follow-up with dermatology.  Currently reports it flaring at her scalp, back, waist, elbows and legs.    ***UPDATE Itchy, dry skin.  Using some previously prescribed topical medications.  She used to see the dermatologist, hasn't gone back (wasn't happy).   Diabetes: At her physical in 04/2021 she was started on Ozempic--to improve control of sugars, and also help with weight loss.  Metformin dose was decreased to 500mg  once daily. She has continues on the 0.5mg  dose of Ozempic.  We previously discussed the potential to increase Ozempic (and potentially stop metformin), to further help with weight loss. She declined change at that time, wanting to hold off until after vein surgery (which she since had, but still declined change).  She does have constipation which may be related to the medication.  See below. We had also discussed referral to [facility] to assist with weight loss.  Last A1c was 5.7 in May 2024, up from 5.2% in 04/2022.  Blood sugars are running 120's-130's  Denies hypoglycemia, polydipsia and polyuria.  Last yearly eye exam was in 06/2021 (that we have). ***UPDATE Patient checks feet regularly without concerns.  Denies numbness, tingling (other than intermittent numbness in the 2nd and 3rd toes on the right foot, related to h/o fractures, didn't heal right, per pt). No skin lesions.    Hypertension: Reports compliance with bisoprolol HCT, and denies side effects. Allergic to lisinopril. Denies headaches, chest pain or dizziness.  Edema only in RLE occasionally. BP's elsewhere 120's-130/60's-70's.   BP Readings from Last 3 Encounters:  04/28/23 130/70  04/14/23 124/68  03/27/23 137/64    Venous insufficiency and edema:   She is s/p laser ablation on the RLE and was told she didn't need  surgery on the left. She continues to wear compression socks on both legs, most days. She no longer takes lasix daily, just as needed.   She denies any skin lesions/wounds.    H/o aortic stenosis--she underwent TAVR 07/2020.  She was prescribed ABX for SBE prophylaxis by cardiologist. She denies  syncope, angina, dyspnea (at baseline).  She was noted to have mild nonobstructive CAD on cardiac cath.  Last echo was 08/2021, last visit with cardiology 02/2022 (Dr. Rubie Maid).  Has appointment today.     Hyperlipidemia follow-up: Patient is reportedly following a low-fat, low cholesterol diet. Compliant with simvastatin and denies medication side effects.  She has known aortic atherosclerosis.   Lipids were at goal on last check:   Lab Results  Component Value Date   CHOL 113 11/20/2022   HDL 54 11/20/2022   LDLCALC 45 11/20/2022   TRIG 67 11/20/2022   CHOLHDL 2.1 11/20/2022     Cirrhosis:  CT 10/2019 revealed hepatic cirrhosis and findings of portal venous hypertension. No evidence of ascites. Dr. Loreta Ave is her GI, only sees her for colonoscopies. She has had normal PT/INR, albumin, LFT's. Findings of cirrhosis also noted on her lung cancer screens. She recently saw [facility]. CT abdomen/pelvis on 04/29/23 showed: IMPRESSION: 1. Distal colonic diverticulosis with no evidence of acute diverticulitis. 2. Cirrhosis, with portal venous hypertension manifested by splenomegaly and upper abdominal varices. 3.  Aortic Atherosclerosis (ICD10-I70.0).  Recent labs:  Lab Results  Component Value Date   WBC 5.6 04/28/2023   HGB 13.7 04/28/2023   HCT 43.2 04/28/2023   MCV 91.0 04/28/2023   PLT 131.0 (L) 04/28/2023   Lab Results  Component Value Date   INR 1.4 (H) 04/28/2023   INR 1.1 11/20/2022   INR 1.1 (H) 09/13/2021     Chemistry      Component Value Date/Time   NA 138 04/28/2023 1544   NA 140 12/09/2021 0855   K 3.7 04/28/2023 1544   CL 104 04/28/2023 1544   CO2 28 04/28/2023 1544   BUN 16 04/28/2023 1544   BUN 15 12/09/2021 0855   CREATININE 0.64 04/28/2023 1544   CREATININE 0.70 03/27/2023 1112   CREATININE 0.75 03/05/2017 0736      Component Value Date/Time   CALCIUM 9.1 04/28/2023 1544   ALKPHOS 61 04/28/2023 1544   AST 17 04/28/2023 1544   AST 21 03/27/2023  1112   ALT 12 04/28/2023 1544   ALT 12 03/27/2023 1112   BILITOT 1.1 04/28/2023 1544   BILITOT 1.2 03/27/2023 1112      Component Ref Range & Units 1 mo ago  Ammonia < OR = 72 umol/L 24   Component Ref Range & Units 1 mo ago  AFP-Tumor Marker ng/mL 3.1  Comment: Reference Range:   <6.1   Lab Results  Component Value Date   TSH 2.63 04/28/2023   Lab Results  Component Value Date   ESRSEDRATE 14 04/28/2023   At visit with GI, they also discussed her change in bowels--had been having some diarrhea, and constipation, as well as poorly controlled reflux.  She had heme + stools. They recommended daily Miralax, and daily omeprazole. She is scheduled for endoscopy/colonoscopy in the hospital 07/20/23.   H/o bladder cancer--lesion in bladder was noted incidentally on CT 11/2016 prior to hernia surgery. Pathology showed low grade papillary urothelial CA, and she was treated with intravesicular chemo.  She gets yearly cystoscopy, UTD per pt.   She takes Valtrex preventatively for HSV flare on  hip.  At one point, after she got her shingles vaccine, her flares decreased, and she changed to prn dosing.  She ultimately restarted taking it daily because she had outbreaks recur regularly. Denies any outbreaks.    Immunization History  Administered Date(s) Administered   Fluad Quad(high Dose 65+) 04/07/2019, 04/26/2020, 05/08/2021, 03/25/2022   Fluad Trivalent(High Dose 65+) 04/14/2023   Influenza Split 05/28/2011   Influenza, High Dose Seasonal PF 04/07/2013, 05/15/2014, 05/30/2015, 03/25/2016, 03/05/2017, 04/01/2018   Influenza, Seasonal, Injecte, Preservative Fre 06/07/2012   PFIZER Comirnaty(Gray Top)Covid-19 Tri-Sucrose Vaccine 10/25/2020   PFIZER(Purple Top)SARS-COV-2 Vaccination 08/20/2019, 09/13/2019, 04/14/2020   Pfizer Covid-19 Vaccine Bivalent Booster 53yrs & up 05/08/2021   Pneumococcal Conjugate-13 07/06/2014   Pneumococcal Polysaccharide-23 12/15/2004, 05/28/2011, 05/27/2021    Rsv, Bivalent, Protein Subunit Rsvpref,pf Verdis Frederickson) 05/20/2022   Tdap 02/23/2008, 06/15/2018   Zoster Recombinant(Shingrix) 12/02/2017, 02/06/2018   Zoster, Live 05/17/2010   Last Pap smear:  02/2017--normal, no high risk HPV detected Last mammogram: 11/2022 Last colonoscopy: 12/2015 with Dr. Mann--diverticulosis and internal hemorrhoids.  F/U recommend 7 years (12/2022). She is scheduled for colonoscopy and EGD 07/20/23 with Dr. Adela Lank Last DEXA: 09/2021 T-2.0 at L forearm radius.  -1.8 at L fem neck Dentist: twice a year Ophtho: yearly (at least) Exercise:  She walks 10-15 minutes/day around her house.  Has dumbbells at home, not using them.    Patient Care Team: Joselyn Arrow, MD as PCP - General (Family Medicine) Croitoru, Rachelle Hora, MD as PCP - Cardiology (Cardiology) Joselyn Arrow, MD as Referring Physician (Family Medicine) Verner Chol, [facility] (Inactive) as Pharmacist (Pharmacist) Raymondo Band as Physician Assistant (Hematology and Oncology) Charna Elizabeth, MD as Consulting Physician (Gastroenterology) Griselda Miner, MD as Consulting Physician (General Surgery) Heloise Purpura, MD as Consulting Physician (Urology) Marcene Corning, MD as Consulting Physician (Orthopedic Surgery) Luciano Cutter, MD as Consulting Physician (Pulmonary Disease) Samson Frederic, MD as Consulting Physician (Orthopedic Surgery) Yolonda Kida, MD as Consulting Physician (Orthopedic Surgery) Dermatologist--Dr. Imogene Burn [facility]) Dentist--Dr. Azucena Cecil Ophtho--[facility] Retinal specialist--Dr. Genia Del GI: Dr. Adela Lank  Depression Screening: Flowsheet Row Office Visit from 05/14/2022 in [facility]  PHQ-2 Total Score 0       Flowsheet Row Office Visit from 05/14/2022 in [facility]  PHQ-9 Total Score 0         05/14/2022    1:45 PM 02/26/2022    3:49 PM 01/16/2022    3:51 PM  GAD 7 : Generalized Anxiety Score  Nervous, Anxious, on Edge 0 0 1   Control/stop worrying 2 0 3  Worry too much - different things 0 0 3  Trouble relaxing 0 0 2  Restless 0 0 3  Easily annoyed or irritable 0 1 1  Afraid - awful might happen 0 0 0  Total GAD 7 Score 2 1 13   Anxiety Difficulty Not difficult at all Not difficult at all Not difficult at all    Falls screen:     05/14/2022    1:44 PM 02/26/2022    3:44 PM 11/14/2021   10:05 AM 05/08/2021   10:07 AM 10/25/2020   11:39 AM  Fall Risk   Falls in the past year? 0 0 0 0 0  Number falls in past yr: 0 0 0 0 0  Injury with Fall? 0 0 0 0 0  Risk for fall due to : No Fall Risks No Fall Risks No Fall Risks No Fall Risks No Fall Risks  Follow up Falls evaluation  completed Falls evaluation completed Falls evaluation completed Falls evaluation completed Falls evaluation completed     Functional Status Survey:         End of Life Discussion:  Patient has a living will and medical power of attorney, scanned in chart.   PMH, PSH, SH and FH were reviewed and updated.     ROS: The patient denies anorexia, fever, decreased hearing, ear pain, sore throat, breast concerns, chest pain, palpitations, dizziness, syncope, cough, swelling, nausea, vomiting, constipation, diarrhea, abdominal pain, melena, hematochezia, indigestion/heartburn, hematuria, incontinence, dysuria, vaginal bleeding, discharge, odor or itch, genital lesions, numbness, tingling, weakness, tremor, suspicious skin lesions, abnormal bleeding, or enlarged lymph nodes.  Bilateral hip pain only when she lays on her side (bursitis). No hip pain with walking Allergies--has some chronic runny nose and intermittent ear popping. Denies nosebleeds (only when using nasal steroids). Some hand pain from arthritis, mostly in the thumbs Some tingling in right 2nd and 3rd toes, chronic/stable. R shoulder with limited ROM, tolerable. Only hurts at end of ROM Skin itching from eczema (back, arm).  Eczema is flaring per HPI Vision unchanged from last  year (some issues related to macular degeneration, worse at night). Breathing is stable. Uses oxygen only at night.  Moods are good, some chronic anxiety.    PHYSICAL EXAM:  LMP  (LMP Unknown)   Wt Readings from Last 3 Encounters:  04/28/23 231 lb 6 oz (105 kg)  04/14/23 231 lb (104.8 kg)  03/27/23 231 lb 1.6 oz (104.8 kg)    General Appearance:   Alert, cooperative, no distress, appears stated age.   Head:   Normocephalic, without obvious abnormality, atraumatic    Eyes:   PERRL, conjunctiva/corneas clear, EOM's intact, fundi benign.   Ears:   Normal TM's and external ear canals    Nose:   No drainage or sinus tenderness  Throat:   Normal mucosa  Neck:   Supple, no lymphadenopathy; thyroid: no enlargement/tenderness/ nodules; no carotid bruit or JVD.   Back:   Spine nontender, no curvature, ROM normal, no CVA tenderness    Lungs:   Clear to auscultation bilaterally without wheezes, rales or ronchi; respirations unlabored. Breath sounds are distant   Chest Wall:   No tenderness or mass. Absence of R breast, WHSS. Also WHSS L upper chest  Heart:   Regular rate and rhythm, S1 and S2 normal, no rub or gallop. Murmur persists, softer than prior to surgery; loudest at RUSB and radiates to carotids  Breast Exam:   No tenderness, masses, or nipple discharge or inversion of L breast. WHSS along superior aspect of areola and left upper chest. No axillary lymphadenopathy. Right breast is absent.    Abdomen:   Soft, non-tender, nondistended, normoactive bowel sounds, no masses, no hepatosplenomegaly. + abdominal obesity. WHSS.   Genitalia:   Normal external genitalia without lesions, mild atrophic changes. BUS and vagina normal; No cervical motion tenderness. No abnormal vaginal discharge. Uterus and adnexa not enlarged, nontender, no masses, although exam is limited by body habitus. Pap not performed ***Defer??    Rectal:   Deferred, recently done by GI  Extremities:   Trace edema R>L. Normal  diabetic foot exam.   Pulses:   2+ and symmetric all extremities    Skin:   Skin turgor normal. Dry skin at lower back, legs, arms, flaky on feet. Purpura noted on R forearm. Venous stasis changes/hyperpigmentation bilateral lower legs, R>L. There is prominent superficial veins in feet giving bluish discoloration, L>R. Prominent vein  right shin.   Normal sensation to monofilament. Large patch/plaque of dry, hyperkeratotic skin at L elbow (posterior, not at antecub fossa), small dry patch at dorsum L wrist. Low back appears fairly normal, just dry. ***  Lymph nodes:  Cervical, supraclavicular, inguinal and axillary nodes normal    Neurologic:   Normal strength, sensation and gait; reflexes 2+ and symmetric throughout                  Psych:  Normal mood, affect, hygiene and grooming    ***murmur rusb to carotids UPDATE skin--pupura? Stasis changes, eczema  ***Defer pelvic/rectal  DIABETIC FOOT EXAM   ASSESSMENT/PLAN:  Gad-7, phq-9 A1c ??do we have new machine yet?  Can skip pelvic and rectal (unless having concerns--recently had rectal by G)  When was her last eye exam, please get--last DM eye exam abstracted was 06/2021. Saw Dr. Genia Del in June. ?about regular eye exam  Azelastine RF? Ozempic RF--willing to increase to 1mg  now??  Obesity/DM--titrate up ozempic??? [facility]???  Discussed monthly self breast exams and yearly mammograms; at least 30 minutes of aerobic activity at least 5 days/week and weight-bearing exercise 2x/week; proper sunscreen use reviewed; healthy diet, including goals of calcium and vitamin D intake and alcohol recommendations (less than or equal to 1 drink/day) reviewed; regular seatbelt use; changing batteries in smoke detectors.  Immunization recommendations discussed--continue high dose flu shots yearly. Bivalent COVID booster recommended, declined.  Recommended RSV vaccine from pharmacy. Colonoscopy recommendations reviewed, UTD--f/u with Dr. Loreta Ave due  12/2022.  Full Code, Full Care MOST form reviewed and updated  Total FTF time 65 min, with at least 20 add'l min in chart prep/review, documentation, with significant portion of this related to NON-wellness diagnoses.  Medicare Attestation I have personally reviewed: The patient's medical and social history Their use of alcohol, tobacco or illicit drugs Their current medications and supplements The patient's functional ability including ADLs,fall risks, home safety risks, cognitive, and hearing and visual impairment Diet and physical activities Evidence for depression or mood disorders  The patient's weight, height, BMI, and visual acuity have been recorded in the chart.  I have made referrals, counseling, and provided education to the patient based on review of the above and I have provided the patient with a written personalized care plan for preventive services.

## 2023-06-02 NOTE — Patient Instructions (Incomplete)
HEALTH MAINTENANCE RECOMMENDATIONS:  It is recommended that you get at least 30 minutes of aerobic exercise at least 5 days/week (for weight loss, you may need as much as 60-90 minutes). This can be any activity that gets your heart rate up. This can be divided in 10-15 minute intervals if needed, but try and build up your endurance at least once a week.  Weight bearing exercise is also recommended twice weekly.  Eat a healthy diet with lots of vegetables, fruits and fiber.  "Colorful" foods have a lot of vitamins (ie green vegetables, tomatoes, red peppers, etc).  Limit sweet tea, regular sodas and alcoholic beverages, all of which has a lot of calories and sugar.  Up to 1 alcoholic drink daily may be beneficial for women (unless trying to lose weight, watch sugars).  Drink a lot of water.  Calcium recommendations are 1200-1500 mg daily (1500 mg for postmenopausal women or women without ovaries), and vitamin D 1000 IU daily.  This should be obtained from diet and/or supplements (vitamins), and calcium should not be taken all at once, but in divided doses.  Monthly self breast exams and yearly mammograms for women over the age of 1 is recommended.  Sunscreen of at least SPF 30 should be used on all sun-exposed parts of the skin when outside between the hours of 10 am and 4 pm (not just when at beach or pool, but even with exercise, golf, tennis, and yard work!)  Use a sunscreen that says "broad spectrum" so it covers both UVA and UVB rays, and make sure to reapply every 1-2 hours.  Remember to change the batteries in your smoke detectors when changing your clock times in the spring and fall. Carbon monoxide detectors are recommended for your home.  Use your seat belt every time you are in a car, and please drive safely and not be distracted with cell phones and texting while driving.   Shannon Schultz , Thank you for taking time to come for your Medicare Wellness Visit. I appreciate your ongoing  commitment to your health goals. Please review the following plan we discussed and let me know if I can assist you in the future.   This is a list of the screening recommended for you and due dates:  Health Maintenance  Topic Date Due   Eye exam for diabetics  07/05/2022   Colon Cancer Screening  01/23/2023   Complete foot exam   05/15/2023   Hemoglobin A1C  05/23/2023   COVID-19 Vaccine (6 - 2023-24 season) 06/18/2023*   Yearly kidney health urinalysis for diabetes  11/20/2023   Screening for Lung Cancer  03/15/2024   Yearly kidney function blood test for diabetes  04/27/2024   Medicare Annual Wellness Visit  06/02/2024   DTaP/Tdap/Td vaccine (3 - Td or Tdap) 06/15/2028   Pneumonia Vaccine  Completed   Flu Shot  Completed   Hepatitis C Screening  Completed   Zoster (Shingles) Vaccine  Completed   HPV Vaccine  Aged Out  *Topic was postponed. The date shown is not the original due date.     We discussed trying some relaxation techniques when you feel stressed or overwhelmed.  If these aren't working, and you are still feeling very stressed and anxious, we can refill your alprazolam. It is to be used sparingly. Ideally, the lexapro should be treating the anxiety, but sometimes people need a little extra help when there are lots of stressors.  Increase the ozempic to 1mg  weekly. Try  and check your sugars either 2 hours after lunch or dinner, or at bedtime. Keep a lot of your sugars, with a separate column for the morning (fasting) sugars, and the evening sugars. Bring your list of sugars to your next visit.    Start miralax once daily to help with constipation.  If/when your bowels are too frequent or too loose, cut back the dose. Some people use 1/2 cap daily, others use a full cap 1-3x/week, you can adjust this based on the frequency of your bowel movements.  Be sure to get a lot of fiber in your diet.  Try and walk around the house twice daily, if possible (goal is 30  minutes/day). Try using the dumbbells (or get other weight-bearing exercise) at least 2x/week.

## 2023-06-03 ENCOUNTER — Ambulatory Visit (INDEPENDENT_AMBULATORY_CARE_PROVIDER_SITE_OTHER): Payer: Medicare HMO | Admitting: Family Medicine

## 2023-06-03 ENCOUNTER — Ambulatory Visit: Payer: Medicare HMO | Attending: Cardiovascular Disease | Admitting: Cardiovascular Disease

## 2023-06-03 ENCOUNTER — Encounter: Payer: Self-pay | Admitting: Family Medicine

## 2023-06-03 ENCOUNTER — Encounter: Payer: Self-pay | Admitting: Cardiovascular Disease

## 2023-06-03 VITALS — BP 124/70 | HR 72 | Ht 62.0 in | Wt 231.6 lb

## 2023-06-03 VITALS — BP 130/68 | HR 81 | Ht 62.0 in | Wt 231.6 lb

## 2023-06-03 DIAGNOSIS — Z23 Encounter for immunization: Secondary | ICD-10-CM

## 2023-06-03 DIAGNOSIS — I10 Essential (primary) hypertension: Secondary | ICD-10-CM | POA: Diagnosis not present

## 2023-06-03 DIAGNOSIS — J9611 Chronic respiratory failure with hypoxia: Secondary | ICD-10-CM

## 2023-06-03 DIAGNOSIS — Z0000 Encounter for general adult medical examination without abnormal findings: Secondary | ICD-10-CM | POA: Diagnosis not present

## 2023-06-03 DIAGNOSIS — K7460 Unspecified cirrhosis of liver: Secondary | ICD-10-CM

## 2023-06-03 DIAGNOSIS — J4489 Other specified chronic obstructive pulmonary disease: Secondary | ICD-10-CM

## 2023-06-03 DIAGNOSIS — Z6841 Body Mass Index (BMI) 40.0 and over, adult: Secondary | ICD-10-CM

## 2023-06-03 DIAGNOSIS — J439 Emphysema, unspecified: Secondary | ICD-10-CM

## 2023-06-03 DIAGNOSIS — E7800 Pure hypercholesterolemia, unspecified: Secondary | ICD-10-CM | POA: Diagnosis not present

## 2023-06-03 DIAGNOSIS — F411 Generalized anxiety disorder: Secondary | ICD-10-CM

## 2023-06-03 DIAGNOSIS — Z952 Presence of prosthetic heart valve: Secondary | ICD-10-CM

## 2023-06-03 DIAGNOSIS — E1159 Type 2 diabetes mellitus with other circulatory complications: Secondary | ICD-10-CM | POA: Diagnosis not present

## 2023-06-03 DIAGNOSIS — E66813 Obesity, class 3: Secondary | ICD-10-CM

## 2023-06-03 DIAGNOSIS — I700 Atherosclerosis of aorta: Secondary | ICD-10-CM

## 2023-06-03 DIAGNOSIS — E1169 Type 2 diabetes mellitus with other specified complication: Secondary | ICD-10-CM | POA: Diagnosis not present

## 2023-06-03 DIAGNOSIS — K5900 Constipation, unspecified: Secondary | ICD-10-CM

## 2023-06-03 DIAGNOSIS — B009 Herpesviral infection, unspecified: Secondary | ICD-10-CM

## 2023-06-03 DIAGNOSIS — Z86711 Personal history of pulmonary embolism: Secondary | ICD-10-CM

## 2023-06-03 LAB — HEMOGLOBIN A1C
Est. average glucose Bld gHb Est-mCnc: 120 mg/dL
Hgb A1c MFr Bld: 5.8 % — ABNORMAL HIGH (ref 4.8–5.6)

## 2023-06-03 MED ORDER — SIMVASTATIN 20 MG PO TABS: 20.0000 mg | ORAL_TABLET | Freq: Every day | ORAL | 1 refills | Status: DC

## 2023-06-03 NOTE — Progress Notes (Signed)
Cardiology Office Note:    Date:  06/03/2023   ID:  Shannon Schultz, DOB [birth year], MRN [id number]  PCP:  Joselyn Arrow, MD  Cardiologist:   Thurmon Fair, MD Electrophysiologist:  None  Referring MD: Joselyn Arrow, MD   Chief Complaint  Patient presents with   Cardiac Valve Problem    History of Present Illness:    Shannon Schultz is a 77 y.o. female with a hx of aortic valve stenosis s/p TAVR (February 2022, 26 mm Edwards Sapien 3 Ultra, L subclavian artery approach), mild nonobstructive CAD (cath January 2022), hypertension, aortic atherosclerosis and 3 cm AAA (very tortuous abdominal aorta with atherosclerosis), severe obesity, type 2 DM and COPD/asthma (45 pack year smoking history, quit 2012).  Had unprovoked acute bilateral pulmonary embolism in March 2023 and is on Eliquis (plan lifelong prophylactic dose starting September 2023). Had right great saphenous vein laser ablation July 2023. Additional problems include OSA on nocturnal O2, not on CPAP; history of breast cancer and urinary bladder cancer, and imaging signs of steatohepatitis with cirrhosis.  She has done well since her last appointment without any complaints of worsening exertional dyspnea (has chronic NYHA functional class II shortness of breath due to lung disease and obesity) or exertional chest pain or exertional dizziness/syncope.  She has occasional random episodes of dizziness at rest, consistently when she is sitting down, never when laying down or standing up.  Sometimes but not always these are triggered by turning her head.  He does not have significant lower extremity edema, orthopnea, PND, claudication or focal neurological complaints.  She has not had any falls or bleeding problems.  Has a scheduled colonoscopy in January  Has good metabolic control with a hemoglobin A1c of 5.7% and LDL of 45.  Past Medical History:  Diagnosis Date   Arthritis    "hands" (05/15/2017)   Bladder cancer (HCC) 2018   Breast cancer,  right (HCC) 1992   DCIS,bladder ca (just dx)   Colon polyp    Complication of anesthesia 1992   "local anesthesia" used was hard to awaken from-no problems since (05/15/2017)   COPD (chronic obstructive pulmonary disease) (HCC)    Coronary artery disease    COVID-19 virus infection 05/05/2019   Diverticulosis    Elevated liver enzymes    fatty liver per ultrasound per pt   FHx: BRCA2 gene positive    sister with BRCA2 mutation (pt tested NEGATIVE)   HLD (hyperlipidemia)    HSV (herpes simplex virus) infection    on hip--on daily suppression   Hypertension    Hypothyroidism    took med 7 yrs after birth of 1st child   Impaired glucose tolerance    Migraine    Nonalcoholic steatohepatitis (NASH) 03/23/2022   On home oxygen therapy    "have it available but I'm not using it" (05/15/2017)   Osteopenia    Personal history of breast cancer 11/15/2021   S/P TAVR (transcatheter aortic valve replacement) 08/14/2020   s/p TAVR with a 26 mm Edwards S3U via the left subclavian approach by Dr. Cornelius Moras and Dr. Clifton James   Severe aortic stenosis    Severe obesity (BMI >= 40) (HCC) 10/06/2014   Sleep apnea    Sleeps with 2 L O2 @ night   Type 2 diabetes mellitus (HCC)    Urothelial cancer (HCC) 03/10/2017   Incidental bladder mass noted on CT, papillary urothelial CA on pathology from excision 12/2016; post-op instillation of chemo. Due for f/u cystoscopy 03/2017  Venous insufficiency 09/10/2018   Ventral hernia    Ventral hernia without obstruction or gangrene 05/15/2017   Vitamin D deficiency disease     Past Surgical History:  Procedure Laterality Date   ABDOMINAL HERNIA REPAIR  05/15/2017   BREAST BIOPSY Right 1992   BREAST BIOPSY Left 2018   BREAST EXCISIONAL BIOPSY Left 2018   ATYPICAL DUCTAL HYPERPLASIA INVOLVING A COMPLEX   BREAST LUMPECTOMY WITH RADIOACTIVE SEED LOCALIZATION Left 12/22/2016   Procedure: LEFT BREAST LUMPECTOMY WITH RADIOACTIVE SEED LOCALIZATION;  Surgeon: Griselda Miner, MD;  Location: [facility] OR;  Service: General;  Laterality: Left;   CARDIAC CATHETERIZATION     CATARACT EXTRACTION, BILATERAL Bilateral R 03/16/2019 L 03/30/2019   Dr. Alben Spittle   COLONOSCOPY  2009, 08/2010, 12/2015   Dr. Loreta Ave; "only 1 had any polyps" (05/15/2017)   CYSTOSCOPY  04/23/2017   CYSTOSCOPY W/ RETROGRADES Bilateral 01/12/2017   Procedure: CYSTOSCOPY WITH RETROGRADE PYELOGRAM/ EXAM UNDER ANESTHESIA;  Surgeon: Heloise Purpura, MD;  Location: [facility] ORS;  Service: Urology;  Laterality: Bilateral;   ENDOVENOUS ABLATION SAPHENOUS VEIN W/ LASER Right 01/09/2022   endovenous laser ablation right greater saphenous vein by Lemar Livings MD   EYE SURGERY     FEMUR IM NAIL Right 08/24/2019   Procedure: INTRAMEDULLARY (IM) NAIL FEMORAL;  Surgeon: Samson Frederic, MD;  Location: [facility] ORS;  Service: Orthopedics;  Laterality: Right;   HERNIA REPAIR     INSERTION OF MESH N/A 05/15/2017   Procedure: INSERTION OF MESH;  Surgeon: Griselda Miner, MD;  Location: [facility] OR;  Service: General;  Laterality: N/A;   LAPAROSCOPIC CHOLECYSTECTOMY  2003   MASTECTOMY Right 1992   RIGHT/LEFT HEART CATH AND CORONARY ANGIOGRAPHY N/A 07/05/2020   Procedure: RIGHT/LEFT HEART CATH AND CORONARY ANGIOGRAPHY;  Surgeon: Kathleene Hazel, MD;  Location: [facility] INVASIVE CV LAB;  Service: Cardiovascular;  Laterality: N/A;   TEE WITHOUT CARDIOVERSION N/A 08/14/2020   Procedure: TRANSESOPHAGEAL ECHOCARDIOGRAM (TEE);  Surgeon: Kathleene Hazel, MD;  Location: [facility] OR;  Service: Open Heart Surgery;  Laterality: N/A;   TRANSURETHRAL RESECTION OF BLADDER TUMOR WITH MITOMYCIN-C N/A 01/12/2017   Procedure: TRANSURETHRAL RESECTION OF BLADDER TUMOR WITH POSSIBLE POST OPERATIVE INSTILLATION OF MITOMYCIN-C;  Surgeon: Heloise Purpura, MD;  Location: [facility] ORS;  Service: Urology;  Laterality: N/A;   TYMPANOSTOMY TUBE PLACEMENT Bilateral 1980s   UMBILICAL HERNIA REPAIR  2003   VENTRAL HERNIA REPAIR N/A 05/15/2017   Procedure: VENTRAL HERNIA REPAIR  WITH MESH;  Surgeon: Griselda Miner, MD;  Location: [facility] OR;  Service: General;  Laterality: N/A;    Current Medications: Current Meds  Medication Sig   ACCU-CHEK AVIVA PLUS test strip TEST BLOOD SUGAR EVERY DAY   Alcohol Swabs (B-D SINGLE USE SWABS REGULAR) PADS Use twice a day when checking blood sugars   apixaban (ELIQUIS) 2.5 MG TABS tablet Take 1 tablet (2.5 mg total) by mouth 2 (two) times daily.   azelastine (ASTELIN) 0.1 % nasal spray Place 2 sprays into both nostrils 2 (two) times daily. Use in each nostril as directed   bisoprolol-hydrochlorothiazide (ZIAC) 10-6.25 MG tablet TAKE 1 TABLET EVERY DAY   cetirizine (ZYRTEC) 10 MG tablet Take 10 mg by mouth daily.   clobetasol cream (TEMOVATE) 0.05 % Apply 1 Application topically 2 (two) times daily.   escitalopram (LEXAPRO) 10 MG tablet Take 1 tablet (10 mg total) by mouth daily.   Fluticasone-Umeclidin-Vilant (TRELEGY ELLIPTA) 100-62.5-25 MCG/ACT AEPB Inhale 1 puff into the lungs daily.   guaiFENesin (MUCINEX) 600 MG 12  hr tablet Take 600 mg by mouth at bedtime.   metFORMIN (GLUCOPHAGE-XR) 500 MG 24 hr tablet Take 1 tablet (500 mg total) by mouth daily with breakfast.   montelukast (SINGULAIR) 10 MG tablet Take 1 tablet (10 mg total) by mouth at bedtime.   omeprazole (PRILOSEC) 40 MG capsule Take 1 capsule (40 mg total) by mouth daily.   Semaglutide,0.25 or 0.5MG /DOS, (OZEMPIC, 0.25 OR 0.5 MG/DOSE,) 2 MG/1.5ML SOPN Inject 0.5 mg into the skin once a week.   simvastatin (ZOCOR) 20 MG tablet TAKE 1 TABLET AT BEDTIME   TRUEplus Lancets 30G MISC TEST BLOOD SUGAR EVERY DAY   valACYclovir (VALTREX) 500 MG tablet TAKE 1 TABLET EVERY DAY     Allergies:   Contrast media [iodinated contrast media], Iohexol, Lisinopril, Sulfa antibiotics, Latex, Codeine, Levofloxacin, Lipitor [atorvastatin calcium], Meloxicam, Nickel, and Vicodin [hydrocodone-acetaminophen]   Social History   Socioeconomic History   Marital status: Married    Spouse name:  Not on file   Number of children: 3   Years of education: Not on file   Highest education level: Not on file  Occupational History   Occupation: Retired  Tobacco Use   Smoking status: Former    Current packs/day: 0.00    Average packs/day: 1 pack/day for 46.0 years (46.0 ttl pk-yrs)    Types: Cigarettes    Start date: 05/30/1965    Quit date: 05/31/2011    Years since quitting: 12.0   Smokeless tobacco: Never  Vaping Use   Vaping status: Never Used  Substance and Sexual Activity   Alcohol use: No   Drug use: No   Sexual activity: Yes    Partners: Male    Birth control/protection: Post-menopausal  Other Topics Concern   Not on file  Social History Narrative   Lives with her husband.  Children all live in [place] nearby. No pets. 6 grandchildren.   Social Determinants of Health   Financial Resource Strain: Low Risk  (11/20/2022)   Overall Financial Resource Strain (CARDIA)    Difficulty of Paying Living Expenses: Not very hard  Food Insecurity: No Food Insecurity (11/20/2022)   Hunger Vital Sign    Worried About Running Out of Food in the Last Year: Never true    Ran Out of Food in the Last Year: Never true  Transportation Needs: No Transportation Needs (11/20/2022)   PRAPARE - Administrator, Civil Service (Medical): No    Lack of Transportation (Non-Medical): No  Physical Activity: Not on file  Stress: No Stress Concern Present (11/20/2022)   [employer] of Occupational Health - Occupational Stress Questionnaire    Feeling of Stress : Only a little  Social Connections: Socially Integrated (11/20/2022)   Social Connection and Isolation Panel [NHANES]    Frequency of Communication with Friends and Family: Three times a week    Frequency of Social Gatherings with Friends and Family: Once a week    Attends Religious Services: More than 4 times per year    Active Member of [employer] or Organizations: No    Attends Engineer, structural: More than 4 times per  year    Marital Status: Married     Family History: The patient's family history includes Allergies in her sister and sister; Asthma in her sister and sister; Breast cancer (age of onset: 48) in her sister; Cancer in her maternal grandmother and another family member; Diabetes in her father; Fibromyalgia in her sister and sister; Hashimoto's thyroiditis in her sister and  sister; Heart disease in her father and mother; Hyperlipidemia in her sister and sister; Hypertension in her father; Other in her niece; Skin cancer in her sister; Stroke (age of onset: 25) in her paternal grandmother; Tuberculosis in her maternal grandfather.  ROS:   Please see the history of present illness.    All other systems are reviewed and are negative.   EKGs/Labs/Other Studies Reviewed:    The following studies were reviewed today:  CATH 07/05/2020 Mid Cx lesion is 30% stenosed. Mid LAD lesion is 20% stenosed.   Mild non-obstructive CAD Severe aortic stenosis by echo. Cath data with mean gradient of 34.6 mmHg, peak to peak gradient 44 mmHg).  ECHO 09/14/2021   1. Left ventricular ejection fraction, by estimation, is 55 to 60%. The  left ventricle has normal function. The left ventricle has no regional  wall motion abnormalities. There is mild concentric left ventricular  hypertrophy. Left ventricular diastolic  parameters are consistent with Grade I diastolic dysfunction (impaired  relaxation).   2. Right ventricular systolic function is normal. The right ventricular  size is normal.   3. Left atrial size was mildly dilated.   4. The mitral valve is normal in structure. No evidence of mitral valve  regurgitation.   5. The aortic valve has been repaired/replaced. Aortic valve  regurgitation is not visualized. There is a 23 mm Ultra, stented (TAVR)  valve present in the aortic position. Procedure Date: 08/14/2020. Echo  findings are consistent with normal structure and   function of the aortic valve  prosthesis. Aortic valve mean gradient  measures 13.0 mmHg. Aortic valve Vmax measures 2.18 m/s. Aortic valve  acceleration time measures 87 msec.   Event monitor 10/03/2020  The baseline rhythm is normal sinus rhythm with normal circadian variation. Rare PACs and PVCs are seen. There are rare episodes of nonsustained supraventricular tachycardia and nonsustained ventricular tachycardia. There are no sustained arrhythmia, no atrial fibrillation and no bradycardia events. Patient symptoms are more commonly associated with VT and PVC events, but also with one SVT event.   Abnormal event monitor due to rare and brief nonsustained atrial tachycardia and ventricular tachycardia. Symptoms are most commonly attributable to nonsustained VT or periods of frequent isolated PVCs.    EKG:    EKG Interpretation Date/Time:  Wednesday June 03 2023 11:33:28 EST Ventricular Rate:  81 PR Interval:  190 QRS Duration:  144 QT Interval:  438 QTC Calculation: 508 R Axis:   -31  Text Interpretation: Normal sinus rhythm Left axis deviation Left bundle branch block When compared with ECG of 23-Mar-2022 02:38, No significant change since last tracing Confirmed by Shatisha Falter [phone]) on 06/03/2023 11:43:06 AM         Recent Labs: 04/28/2023: ALT 12; BUN 16; Creatinine, Ser 0.64; Hemoglobin 13.7; Platelets 131.0; Potassium 3.7; Sodium 138; TSH 2.63  Recent Lipid Panel    Component Value Date/Time   CHOL 113 11/20/2022 1215   TRIG 67 11/20/2022 1215   HDL 54 11/20/2022 1215   CHOLHDL 2.1 11/20/2022 1215   CHOLHDL 2.2 08/18/2016 1346   VLDL 19 08/18/2016 1346   LDLCALC 45 11/20/2022 1215    Physical Exam:    VS:  BP 130/68 (BP Location: Left Arm, Patient Position: Sitting, Cuff Size: Large)   Pulse 81   Ht 5\' 2"  (1.575 m)   Wt 231 lb 9.6 oz (105.1 kg)   LMP  (LMP Unknown)   SpO2 93%   BMI 42.36 kg/m     Wt  Readings from Last 3 Encounters:  06/03/23 231 lb 9.6 oz (105.1 kg)   04/28/23 231 lb 6 oz (105 kg)  04/14/23 231 lb (104.8 kg)      General: Alert, oriented x3, no distress, severely obese Head: no evidence of trauma, PERRL, EOMI, no exophtalmos or lid lag, no myxedema, no xanthelasma; normal ears, nose and oropharynx Neck: normal jugular venous pulsations and no hepatojugular reflux; brisk carotid pulses without delay.  There is a distinct right carotid bruit. Chest: clear to auscultation, no signs of consolidation by percussion or palpation, normal fremitus, symmetrical and full respiratory excursions Cardiovascular: normal position and quality of the apical impulse, regular rhythm, normal first and second heart sounds,  early peaking 1-2/6 aortic ejection murmur, no diastolic murmurs, rubs or gallops Abdomen: no tenderness or distention, no masses by palpation, no abnormal pulsatility or arterial bruits, normal bowel sounds, no hepatosplenomegaly Extremities: no clubbing, cyanosis or edema; 2+ radial, ulnar and brachial pulses bilaterally; 2+ right femoral, posterior tibial and dorsalis pedis pulses; 2+ left femoral, posterior tibial and dorsalis pedis pulses; no subclavian or femoral bruits Neurological: grossly nonfocal Psych: Normal mood and affect Severely obese    ASSESSMENT:    1. S/P TAVR (transcatheter aortic valve replacement)   2. Aortic atherosclerosis (HCC)   3. Morbid obesity, unspecified obesity type (HCC)   4. Type 2 diabetes mellitus with other specified complication, without long-term current use of insulin (HCC)   5. Hypercholesterolemia   6. Essential hypertension, benign      PLAN:    In order of problems listed above:  AS: Clinically no evidence of aortic valve prosthesis dysfunction.  Will repeat an echo next March.  Understands the need for endocarditis prophylaxis for dental procedures.  Off antiplatelet agents, on long-term anticoagulation for pulm embolism.   Aortic atherosclerosis: Aorta is normal in caliber, but with  extensive tortuosity of the abdominal aorta. Obesity: Remains morbidly obese range.  Does not have significant daytime hypersomnolence. DM: Very good glycemic control. HLP: All the parameters are in target range on statin. HTN: Well-controlled. COPD: Explains most of her residual dyspnea.  Reported prior FEV1 of 52%.   Imaging studies suggest cirrhosis: No known history of liver disease, normal albumin and prothrombin time on recent labs, no signs or symptoms of decompensated liver function or portal hypertension.  Suspect that she may have NASH.  Weight loss would be beneficial. Right carotid bruit: Previous carotid duplex ultrasound shows mild plaque without significant obstruction.   Medication Adjustments/Labs and Tests Ordered: Current medicines are reviewed at length with the patient today.  Concerns regarding medicines are outlined above.  Orders Placed This Encounter  Procedures   EKG 12-Lead   ECHOCARDIOGRAM COMPLETE    No orders of the defined types were placed in this encounter.    Patient Instructions  Medication Instructions:  No changes *If you need a refill on your cardiac medications before your next appointment, please call your pharmacy*  Testing/Procedures: Your physician has requested that you have an echocardiogram in February 2025. Echocardiography is a painless test that uses sound waves to create images of your heart. It provides your doctor with information about the size and shape of your heart and how well your heart's chambers and valves are working. This procedure takes approximately one hour. There are no restrictions for this procedure. Please do NOT wear cologne, perfume, aftershave, or lotions (deodorant is allowed). Please arrive 15 minutes prior to your appointment time.  Please note: We ask at that  you not bring children with you during ultrasound (echo/ vascular) testing. Due to room size and safety concerns, children are not allowed in the  ultrasound rooms during exams. Our front office staff cannot provide observation of children in our lobby area while testing is being conducted. An adult accompanying a patient to their appointment will only be allowed in the ultrasound room at the discretion of the ultrasound technician under special circumstances. We apologize for any inconvenience.    Follow-Up: At [facility], [place], you and your health needs are our priority.  As part of our continuing mission to provide you with exceptional heart care, we have created designated Provider Care Teams.  These Care Teams include your primary Cardiologist (physician) and Advanced Practice Providers (APPs -  Physician Assistants and Nurse Practitioners) who all work together to provide you with the care you need, when you need it.  We recommend signing up for the patient portal called "MyChart".  Sign up information is provided on this After Visit Summary.  MyChart is used to connect with patients for Virtual Visits (Telemedicine).  Patients are able to view lab/test results, encounter notes, upcoming appointments, etc.  Non-urgent messages can be sent to your provider as well.   To learn more about what you can do with MyChart, go to [email or web address].    Your next appointment:   1 year(s)  Provider:   Thurmon Fair, MD       Signed, Thurmon Fair, MD  06/03/2023 12:56 PM    [facility]

## 2023-06-03 NOTE — Patient Instructions (Signed)
Medication Instructions:  No changes *If you need a refill on your cardiac medications before your next appointment, please call your pharmacy*  Testing/Procedures: Your physician has requested that you have an echocardiogram in February 2025. Echocardiography is a painless test that uses sound waves to create images of your heart. It provides your doctor with information about the size and shape of your heart and how well your heart's chambers and valves are working. This procedure takes approximately one hour. There are no restrictions for this procedure. Please do NOT wear cologne, perfume, aftershave, or lotions (deodorant is allowed). Please arrive 15 minutes prior to your appointment time.  Please note: We ask at that you not bring children with you during ultrasound (echo/ vascular) testing. Due to room size and safety concerns, children are not allowed in the ultrasound rooms during exams. Our front office staff cannot provide observation of children in our lobby area while testing is being conducted. An adult accompanying a patient to their appointment will only be allowed in the ultrasound room at the discretion of the ultrasound technician under special circumstances. We apologize for any inconvenience.    Follow-Up: At [facility], you and your health needs are our priority.  As part of our continuing mission to provide you with exceptional heart care, we have created designated Provider Care Teams.  These Care Teams include your primary Cardiologist (physician) and Advanced Practice Providers (APPs -  Physician Assistants and Nurse Practitioners) who all work together to provide you with the care you need, when you need it.  We recommend signing up for the patient portal called "MyChart".  Sign up information is provided on this After Visit Summary.  MyChart is used to connect with patients for Virtual Visits (Telemedicine).  Patients are able to view lab/test results, encounter  notes, upcoming appointments, etc.  Non-urgent messages can be sent to your provider as well.   To learn more about what you can do with MyChart, go to [email or web address].    Your next appointment:   1 year(s)  Provider:   Thurmon Fair, MD

## 2023-06-04 ENCOUNTER — Encounter: Payer: Self-pay | Admitting: Family Medicine

## 2023-06-17 ENCOUNTER — Telehealth: Payer: Self-pay

## 2023-06-17 NOTE — Telephone Encounter (Signed)
Spoke with patient,patient gave consent to fill PAP online for [employer]( [place] )and [facility] provider portion 06/17/23.

## 2023-07-02 NOTE — Telephone Encounter (Signed)
 PT HAS BEEN APPROVE FOR OZEMPIC NOVO NORDISK FOR CALENDAR YEAR 2025

## 2023-07-03 ENCOUNTER — Ambulatory Visit: Payer: Medicare HMO | Admitting: Physician Assistant

## 2023-07-03 ENCOUNTER — Encounter: Payer: Self-pay | Admitting: Physician Assistant

## 2023-07-03 VITALS — BP 128/60 | HR 76 | Ht 62.0 in | Wt 226.1 lb

## 2023-07-03 DIAGNOSIS — K7460 Unspecified cirrhosis of liver: Secondary | ICD-10-CM | POA: Diagnosis not present

## 2023-07-03 DIAGNOSIS — R1084 Generalized abdominal pain: Secondary | ICD-10-CM

## 2023-07-03 DIAGNOSIS — K219 Gastro-esophageal reflux disease without esophagitis: Secondary | ICD-10-CM | POA: Diagnosis not present

## 2023-07-03 DIAGNOSIS — K5909 Other constipation: Secondary | ICD-10-CM | POA: Diagnosis not present

## 2023-07-03 DIAGNOSIS — R197 Diarrhea, unspecified: Secondary | ICD-10-CM

## 2023-07-03 DIAGNOSIS — G4733 Obstructive sleep apnea (adult) (pediatric): Secondary | ICD-10-CM

## 2023-07-03 DIAGNOSIS — Z7901 Long term (current) use of anticoagulants: Secondary | ICD-10-CM

## 2023-07-03 DIAGNOSIS — J449 Chronic obstructive pulmonary disease, unspecified: Secondary | ICD-10-CM | POA: Diagnosis not present

## 2023-07-03 DIAGNOSIS — D696 Thrombocytopenia, unspecified: Secondary | ICD-10-CM

## 2023-07-03 DIAGNOSIS — Z86718 Personal history of other venous thrombosis and embolism: Secondary | ICD-10-CM | POA: Diagnosis not present

## 2023-07-03 MED ORDER — OMEPRAZOLE 40 MG PO CPDR: 40.0000 mg | DELAYED_RELEASE_CAPSULE | Freq: Two times a day (BID) | ORAL | 0 refills | Status: DC

## 2023-07-03 NOTE — Patient Instructions (Addendum)
 Try to increase the omeprazole  to twice a day for at least 2-3 months, and then you can go back to once a day to help the night time stomach aches.   Miralax is an osmotic laxative.  It only brings more water into the stool.  This is safe to take daily.  Can take up to 17 gram of miralax twice a day.  Mix with juice or coffee.  Start 1 capful at night for 3-4 days and reassess your response in 3-4 days.  You can increase and decrease the dose based on your response.  Remember, it can take up to 3-4 days to take effect OR for the effects to wear off.   I often pair this with benefiber in the morning to help assure the stool is not too loose.    You should have an imaging study every 6 months to monitor for the development of hepatocellular carcinoma (liver cancer). The risk is low, but, if liver cancer is diagnosed early, there are better treatment options. Will get AFP, INR, CBC, and CMET.  Will follow up in 6 months to check labs and evaluate.   I recommend a high-protein, primarily plant-based diet.  Avoid red meat. Work to maintain a health weight. Weigh yourself daily- call if you have weight gain of greater than 5 lbs in a 1-2 days, leg swelling, or new swelling in your abdomen. Minimize salt intake- VERY important. Please do not consume more than 2000 mg of sodium every day. Monitor your blood pressure at home.  Stay active. Weight-based exercise for 30 minutes at least 3 days a week is recommended. I recommend that you not drink any alcohol  including beer, wine, liquor, and non-alcoholic beer.   You are at increased risk of osteopenia and osteoporosis. You should be screened for these metabolic bone diseases if you have not already had the testing performed.  Cirrhosis Cirrhosis is long-term (chronic) liver injury. The liver is the body's largest internal organ, and it performs many functions. It converts food into energy, removes toxic material from the blood, makes important  proteins, and absorbs necessary vitamins from food. In cirrhosis, healthy liver cells are replaced by scar tissue. This prevents blood from flowing through the liver and makes it difficult for the liver to complete its functions. What are the causes? Common causes of this condition are hepatitis C and long-term alcohol  abuse. Other causes include: Nonalcoholic fatty liver disease (NAFLD). This happens when fat is deposited in the liver by causes other than alcohol . Hepatitis B infection. Autoimmune hepatitis. In this condition, the body's defense system (immune system) mistakenly attacks the liver cells, causing inflammation. Diseases that cause blockage of ducts inside the liver. Inherited liver diseases, such as hemochromatosis. This is one of the most common inherited liver diseases. In this disease, deposits of iron collect in the liver and other organs. Reactions to certain long-term medicines, such as amiodarone, a heart medicine. Parasitic infections. These include schistosomiasis, which is caused by a flatworm. Long-term contact to certain toxins. These toxins include certain organic solvents, such as toluene and chloroform. What increases the risk? You are more likely to develop this condition if: You have certain types of viral hepatitis. You abuse alcohol , especially if you are female. You are overweight. You use IV drugs and share needles. You have unprotected sex with someone who has viral hepatitis. What are the signs or symptoms? You may not have any signs and symptoms at first. Symptoms may not develop until the  damage to your liver starts to get worse. Early symptoms may include: Weakness and tiredness (fatigue). Changes in sleep patterns or having trouble sleeping. Itchiness. Tenderness in the right-upper part of your abdomen. Weight loss and muscle loss. Nausea. Loss of appetite. Later symptoms may include: Fatigue or weakness that is getting worse. Yellow skin and  eyes (jaundice). Buildup of fluid in the abdomen (ascites). You may notice that your clothes are tight around your waist. Weight gain and swelling of the feet and ankles (edema). Trouble breathing. Easy bruising and bleeding. Vomiting blood, or black or bloody stool. Mental confusion. How is this diagnosed? Your health care provider may suspect cirrhosis based on your symptoms and medical history, especially if you have other medical conditions or a history of alcohol  abuse. Your health care provider will do a physical exam to feel your liver and to check for signs of cirrhosis. Tests may include: Blood tests to check: For hepatitis B or C. Kidney function. Liver function. Imaging tests such as: MRI or CT scan to look for changes seen in advanced cirrhosis. Ultrasound to see if normal liver tissue is being replaced by scar tissue. A procedure in which a long needle is used to take a sample of liver tissue to be checked in a lab (biopsy). Liver biopsy can confirm the diagnosis of cirrhosis. How is this treated? Treatment for this condition depends on how damaged your liver is and what caused the damage. It may include treating the symptoms of cirrhosis, or treating the underlying causes to slow the damage. Treatment may include: Making lifestyle changes, such as: Eating a healthy diet. You may need to work with your health care provider or a dietitian to develop an eating plan. Restricting salt intake. Maintaining a healthy weight. Not abusing drugs or alcohol . Taking medicines to: Treat liver infections or other infections. Control itching. Reduce fluid buildup. Reduce certain blood toxins. Reduce risk of bleeding from enlarged blood vessels in the stomach or esophagus (varices). Liver transplant. In this procedure, a liver from a donor is used to replace your diseased liver. This is done if cirrhosis has caused liver failure. Other treatments and procedures may be done depending on  the problems that you get from cirrhosis. Common problems include liver-related kidney failure (hepatorenal syndrome). Follow these instructions at home:  Take medicines only as told by your health care provider. Do not use medicines that are toxic to your liver. Ask your health care provider before taking any new medicines, including over-the-counter medicines such as NSAIDs. Rest as needed. Eat a well-balanced diet. Limit your salt or water intake, if your health care provider asks you to do this. Do not drink alcohol . This is especially important if you routinely take acetaminophen . Keep all follow-up visits. This is important. Contact a health care provider if you: Have fatigue or weakness that is getting worse. Develop swelling of the hands, feet, or legs, or a buildup of fluid in the abdomen (ascites). Have a fever or chills. Develop loss of appetite. Have nausea or vomiting. Develop jaundice. Develop easy bruising or bleeding. Get help right away if you: Vomit bright red blood or a material that looks like coffee grounds. Have blood in your stools. Notice that your stools appear black and tarry. Become confused. Have chest pain or trouble breathing. These symptoms may represent a serious problem that is an emergency. Do not wait to see if the symptoms will go away. Get medical help right away. Call your local emergency services (911  in the [place] Do not drive yourself to the hospital. Summary Cirrhosis is chronic liver injury. Common causes are hepatitis C and long-term alcohol  abuse. Tests used to diagnose cirrhosis include blood tests, imaging tests, and liver biopsy. Treatment for this condition involves treating the underlying cause. Avoid alcohol , drugs, salt, and medicines that may damage your liver. Get help right away if you vomit bright red blood or a material that looks like coffee grounds. This information is not intended to replace advice given to you by your health  care provider. Make sure you discuss any questions you have with your health care provider. Document Revised: 03/29/2020 Document Reviewed: 03/29/2020 Elsevier Patient Education  2022 [employer]  Gastroparesis Please do small frequent meals like 4-6 meals a day.  Eat and drink liquids at separate times.  Avoid high fiber foods, cook your vegetables, avoid high fat food.  Suggest spreading protein throughout the day (greek yogurt, glucerna, soft meat, milk, eggs) Choose soft foods that you can mash with a fork When you are more symptomatic, change to pureed foods foods and liquids.  Consider reading Living well with Gastroparesis by [facility] Gastroparesis is a condition in which food takes longer than normal to empty from the stomach. This condition is also known as delayed gastric emptying. It is usually a long-term (chronic) condition. There is no cure, but there are treatments and things that you can do at home to help relieve symptoms. Treating the underlying condition that causes gastroparesis can also help relieve symptoms What are the causes? In many cases, the cause of this condition is not known. Possible causes include: A hormone (endocrine) disorder, such as hypothyroidism or diabetes. A nervous system disease, such as Parkinson's disease or multiple sclerosis. Cancer, infection, or surgery that affects the stomach or vagus nerve. The vagus nerve runs from your chest, through your neck, and to the lower part of your brain. A connective tissue disorder, such as scleroderma. Certain medicines. What increases the risk? You are more likely to develop this condition if: You have certain disorders or diseases. These may include: An endocrine disorder. An eating disorder. Amyloidosis. Scleroderma. Parkinson's disease. Multiple sclerosis. Cancer or infection of the stomach or the vagus nerve. You have had surgery on your stomach or vagus nerve. You take certain  medicines. You are female. What are the signs or symptoms? Symptoms of this condition include: Feeling full after eating very little or a loss of appetite. Nausea, vomiting, or heartburn. Bloating of your abdomen. Inconsistent blood sugar (glucose) levels on blood tests. Unexplained weight loss. Acid from the stomach coming up into the esophagus (gastroesophageal reflux). Sudden tightening (spasm) of the stomach, which can be painful. Symptoms may come and go. Some people may not notice any symptoms. How is this diagnosed? This condition is diagnosed with tests, such as: Tests that check how long it takes food to move through the stomach and intestines. These tests include: Upper gastrointestinal (GI) series. For this test, you drink a liquid that shows up well on X-rays, and then X-rays are taken of your intestines. Gastric emptying scintigraphy. For this test, you eat food that contains a small amount of radioactive material, and then scans are taken. Wireless capsule GI monitoring system. For this test, you swallow a pill (capsule) that records information about how foods and fluid move through your stomach. Gastric manometry. For this test, a tube is passed down your throat and into your stomach to measure electrical and muscular activity. Endoscopy. For this  test, a long, thin tube with a camera and light on the end is passed down your throat and into your stomach to check for problems in your stomach lining. Ultrasound. This test uses sound waves to create images of the inside of your body. This can help rule out gallbladder disease or pancreatitis as a cause of your symptoms. How is this treated? There is no cure for this condition, but treatment and home care may relieve symptoms. Treatment may include: Treating the underlying cause. Managing your symptoms by making changes to your diet and exercise habits. Taking medicines to control nausea and vomiting and to stimulate stomach  muscles. Getting food through a feeding tube in the hospital. This may be done in severe cases. Having surgery to insert a device called a gastric electrical stimulator into your body. This device helps improve stomach emptying and control nausea and vomiting. Follow these instructions at home: Take over-the-counter and prescription medicines only as told by your health care provider. Follow instructions from your health care provider about eating or drinking restrictions. Your health care provider may recommend that you: Eat smaller meals more often. Eat low-fat foods. Eat low-fiber forms of high-fiber foods. For example, eat cooked vegetables instead of raw vegetables. Have only liquid foods instead of solid foods. Liquid foods are easier to digest. Drink enough fluid to keep your urine pale yellow. Exercise as often as told by your health care provider. Keep all follow-up visits. This is important. Contact a health care provider if you: Notice that your symptoms do not improve with treatment. Have new symptoms. Get help right away if you: Have severe pain in your abdomen that does not improve with treatment. Have nausea that is severe or does not go away. Vomit every time you drink fluids. Summary Gastroparesis is a long-term (chronic) condition in which food takes longer than normal to empty from the stomach. Symptoms include nausea, vomiting, heartburn, bloating of your abdomen, and loss of appetite. Eating smaller portions, low-fat foods, and low-fiber forms of high-fiber foods may help you manage your symptoms. Get help right away if you have severe pain in your abdomen. This information is not intended to replace advice given to you by your health care provider. Make sure you discuss any questions you have with your health care provider. Document Revised: 10/24/2019 Document Reviewed: 10/24/2019 Elsevier Patient Education  2021 [employer].   I appreciate the  opportunity to  care for you  Thank You   [facility]

## 2023-07-03 NOTE — H&P (View-Only) (Signed)
07/03/2023 TECLA MCCAGHREN [id number] 09-08-1945  Referring provider: Joselyn Arrow, MD Primary GI doctor: Dr. Adela Lank  ASSESSMENT AND PLAN:   Cirrhosis most likely secondary to MASH With PHTN, with thrombocytopenia.  No history of alcohol use.  HCC screening 04/29/2023 CT abdomen pelvis with contrast for abdominal pain and HCC screening showed diverticulosis without acute diverticulitis, cirrhosis with portal venous hypertension by splenomegaly and upper abdominal varices aortic atherosclerosis no liver lesions. No ascites aFP 3.1, INR 1.4, CBC platelets 131, normal AST, ALT, alk phos and total bilirubin.  BUN 16, creatinine 0.64 MELD 3.0: 11  Patient scheduled for EGD colonoscopy in hospital setting with Dr. Adela Lank Will need follow-up after that for continuing cirrhosis surveillance  Chronic Constipation-on Ozempic Chronic constipation with recent change in bowel habits since May 2024. Reports hard, pellet-like stools daily with occasional loose stools.  -Start MiraLAX with fiber to improve bowel movements. Repeating colon at the hospital with screening EGD  Gastroesophageal Reflux Disease (GERD) Poorly controlled GERD with infrequent use of omeprazole. Reports tasting previous meals and occasional nausea. She has upper AB pain, worse after dinner, states she will have nausea and does not want food  -omeprazole 40mg , will increase to twice a day -Weight loss -Lifestyle changes given to the patient -Possible gastroparesis component, diet given  Pulmonary Embolism (PE) History of PE in March 2023.  Currently on Eliquis 2.5mg  twice daily. We have permission to hold before the EGD/Colon  Chronic Obstructive Pulmonary Disease (COPD) with Sleep Apnea On 2 liters of oxygen at night. No CPAP. Reports occasional shortness of breath with exertion. -Continue current management.   Patient Care Team: Joselyn Arrow, MD as PCP - General (Family Medicine) Croitoru, Rachelle Hora, MD as PCP  - Cardiology (Cardiology) Joselyn Arrow, MD as Referring Physician (Family Medicine) Verner Chol, [facility] (Inactive) as Pharmacist (Pharmacist) Raymondo Band as Physician Assistant (Hematology and Oncology) Charna Elizabeth, MD as Consulting Physician (Gastroenterology) Griselda Miner, MD as Consulting Physician (General Surgery) Heloise Purpura, MD as Consulting Physician (Urology) Marcene Corning, MD as Consulting Physician (Orthopedic Surgery) Luciano Cutter, MD as Consulting Physician (Pulmonary Disease) Samson Frederic, MD as Consulting Physician (Orthopedic Surgery) Yolonda Kida, MD as Consulting Physician (Orthopedic Surgery)  HISTORY OF PRESENT ILLNESS: 78 y.o. female with a past medical history of status post TAVR 2022, most recent echo 09/13/2021 shows normal structure of aortic valve prosthesis, EF 55 to 60%, COPD, aortic atherosclerosis, PE 09/13/2021 on Eliquis 2.5 mg twice daily, bladder cancer 2018, right breast cancer 1992, cirrhosis noted 08/2016 with thrombocytopenia, hypothyroidism, sleep apnea on 2 L oxygen at night and others listed below presents for evaluation of abdominal pain and swelling.   Patient previously followed by Dr. Loreta Ave.  Last office visit there was 2017 for diverticulosis, NASH, personal history of colon polyps, ventral hernia  01/23/2016 colonoscopy showed diverticula sigmoid colon, small internal hemorrhoids otherwise unremarkable recall 7 years. 03/27/2023 labs reviewed show no anemia no leukocytosis patient does have a chronic thrombocytopenia from 2023, liver function unremarkable normal kidney 2023 CT chest showed cirrhotic liver with nodular contours  04/28/2023 office visit with myself for cirrhosis and change in bowel habits started on MiraLAX for potential overflow diarrhea due to Ozempic use scheduled for EGD and colonoscopy at the hospital 07/20/2023 [facility] screening 04/29/2023 CT abdomen pelvis with contrast for abdominal pain and HCC  screening showed diverticulosis without acute diverticulitis, cirrhosis with portal venous hypertension by splenomegaly and upper abdominal varices aortic atherosclerosis no liver lesions.  Labs from last visit aFP 3.1, INR 1.4, CBC platelets 131, normal AST, ALT, alk phos and total bilirubin.  BUN 16, creatinine 0.64 MELD 3.0: 11   She continues to have epigastric AB pain, worse after dinner, occ unable to eat though she is hungry. Can have nausea, no vomiting, Can have epigastric pain that wakes her in the night. She just recently started miralax for just last 3 days, still having constipation.  Denies confusion. Denies swelling in AB or legs.  No melena, no hematochezia.   She  reports that she quit smoking about 12 years ago. Her smoking use included cigarettes. She started smoking about 58 years ago. She has a 46 pack-year smoking history. She has never used smokeless tobacco. She reports that she does not drink alcohol and does not use drugs.  RELEVANT LABS AND IMAGING: CBC    Component Value Date/Time   WBC 5.6 04/28/2023 1544   RBC 4.75 04/28/2023 1544   HGB 13.7 04/28/2023 1544   HGB 13.8 03/27/2023 1112   HGB 13.4 09/23/2021 1317   HCT 43.2 04/28/2023 1544   HCT 39.1 09/23/2021 1317   PLT 131.0 (L) 04/28/2023 1544   PLT 122 (L) 03/27/2023 1112   PLT 148 (L) 09/23/2021 1317   MCV 91.0 04/28/2023 1544   MCV 89 09/23/2021 1317   MCH 30.2 03/27/2023 1112   MCHC 31.8 04/28/2023 1544   RDW 16.0 (H) 04/28/2023 1544   RDW 13.4 09/23/2021 1317   LYMPHSABS 1.0 04/28/2023 1544   LYMPHSABS 1.0 09/23/2021 1317   MONOABS 0.4 04/28/2023 1544   EOSABS 0.1 04/28/2023 1544   EOSABS 0.1 09/23/2021 1317   BASOSABS 0.1 04/28/2023 1544   BASOSABS 0.1 09/23/2021 1317   Recent Labs    09/25/22 1049 03/27/23 1112 04/28/23 1544  HGB 13.1 13.8 13.7    CMP     Component Value Date/Time   NA 138 04/28/2023 1544   NA 140 12/09/2021 0855   K 3.7 04/28/2023 1544   CL 104 04/28/2023 1544    CO2 28 04/28/2023 1544   GLUCOSE 100 (H) 04/28/2023 1544   BUN 16 04/28/2023 1544   BUN 15 12/09/2021 0855   CREATININE 0.64 04/28/2023 1544   CREATININE 0.70 03/27/2023 1112   CREATININE 0.75 03/05/2017 0736   CALCIUM 9.1 04/28/2023 1544   PROT 6.8 04/28/2023 1544   PROT 6.2 12/09/2021 0855   ALBUMIN 3.9 04/28/2023 1544   ALBUMIN 4.0 12/09/2021 0855   AST 17 04/28/2023 1544   AST 21 03/27/2023 1112   ALT 12 04/28/2023 1544   ALT 12 03/27/2023 1112   ALKPHOS 61 04/28/2023 1544   BILITOT 1.1 04/28/2023 1544   BILITOT 1.2 03/27/2023 1112   GFRNONAA >60 03/27/2023 1112   GFRAA 97 07/02/2020 1149      Latest Ref Rng & Units 04/28/2023    3:44 PM 03/27/2023   11:12 AM 09/25/2022   10:49 AM  Hepatic Function  Total Protein 6.0 - 8.3 g/dL 6.8  6.9  6.8   Albumin 3.5 - 5.2 g/dL 3.9  3.9  3.9   AST 0 - 37 U/L 17  21  21    ALT 0 - 35 U/L 12  12  11    Alk Phosphatase 39 - 117 U/L 61  62  65   Total Bilirubin 0.2 - 1.2 mg/dL 1.1  1.2  1.1       Latest Ref Rng & Units 04/28/2023    3:44 PM  Hepatitis C  AFP  ng/mL 3.1     Current Medications:   Current Outpatient Medications (Endocrine & Metabolic):    metFORMIN (GLUCOPHAGE-XR) 500 MG 24 hr tablet, Take 1 tablet (500 mg total) by mouth daily with breakfast.   Semaglutide,0.25 or 0.5MG /DOS, (OZEMPIC, 0.25 OR 0.5 MG/DOSE,) 2 MG/1.5ML SOPN, Inject 0.5 mg into the skin once a week.  Current Outpatient Medications (Cardiovascular):    bisoprolol-hydrochlorothiazide (ZIAC) 10-6.25 MG tablet, TAKE 1 TABLET EVERY DAY   simvastatin (ZOCOR) 20 MG tablet, Take 1 tablet (20 mg total) by mouth at bedtime.  Current Outpatient Medications (Respiratory):    albuterol (PROVENTIL) (2.5 MG/3ML) 0.083% nebulizer solution, Take 3 mLs (2.5 mg total) by nebulization every 6 (six) hours as needed for wheezing or shortness of breath.   albuterol (VENTOLIN HFA) 108 (90 Base) MCG/ACT inhaler, Inhale 2 puffs into the lungs every 6 (six) hours as needed  for wheezing or shortness of breath.   cetirizine (ZYRTEC) 10 MG tablet, Take 10 mg by mouth daily.   Fluticasone-Umeclidin-Vilant (TRELEGY ELLIPTA) 100-62.5-25 MCG/ACT AEPB, Inhale 1 puff into the lungs daily.   guaiFENesin (MUCINEX) 600 MG 12 hr tablet, Take 600 mg by mouth at bedtime.   montelukast (SINGULAIR) 10 MG tablet, Take 1 tablet (10 mg total) by mouth at bedtime.  Current Outpatient Medications (Analgesics):    acetaminophen (TYLENOL) 500 MG tablet, Take 500-1,000 mg by mouth every 6 (six) hours as needed for moderate pain (pain score 4-6) or headache.  Current Outpatient Medications (Hematological):    apixaban (ELIQUIS) 2.5 MG TABS tablet, Take 1 tablet (2.5 mg total) by mouth 2 (two) times daily.  Current Outpatient Medications (Other):    ACCU-CHEK AVIVA PLUS test strip, TEST BLOOD SUGAR EVERY DAY   Alcohol Swabs (B-D SINGLE USE SWABS REGULAR) PADS, Use twice a day when checking blood sugars   clobetasol cream (TEMOVATE) 0.05 %, Apply 1 Application topically 2 (two) times daily.   escitalopram (LEXAPRO) 10 MG tablet, Take 1 tablet (10 mg total) by mouth daily.   Multiple Vitamins-Minerals (PRESERVISION AREDS 2 PO), Take 2 each by mouth daily.   TRUEplus Lancets 30G MISC, TEST BLOOD SUGAR EVERY DAY   valACYclovir (VALTREX) 500 MG tablet, TAKE 1 TABLET EVERY DAY   amoxicillin (AMOXIL) 500 MG capsule, Take 1,000 mg by mouth 2 (two) times daily. (Patient not taking: Reported on 07/03/2023)   omeprazole (PRILOSEC) 40 MG capsule, Take 1 capsule (40 mg total) by mouth 2 (two) times daily.  Medical History:  Past Medical History:  Diagnosis Date   Arthritis    "hands" (05/15/2017)   Bladder cancer (HCC) 2018   Breast cancer, right (HCC) 1992   DCIS,bladder ca (just dx)   Colon polyp    Complication of anesthesia 1992   "local anesthesia" used was hard to awaken from-no problems since (05/15/2017)   COPD (chronic obstructive pulmonary disease) (HCC)    Coronary artery disease     COVID-19 virus infection 05/05/2019   Diverticulosis    Elevated liver enzymes    fatty liver per ultrasound per pt   FHx: BRCA2 gene positive    sister with BRCA2 mutation (pt tested NEGATIVE)   HLD (hyperlipidemia)    HSV (herpes simplex virus) infection    on hip--on daily suppression   Hypertension    Hypothyroidism    took med 7 yrs after birth of 1st child   Impaired glucose tolerance    Migraine    Nonalcoholic steatohepatitis (NASH) 03/23/2022   On home oxygen therapy    "  have it available but I'm not using it" (05/15/2017)   Osteopenia    Personal history of breast cancer 11/15/2021   S/P TAVR (transcatheter aortic valve replacement) 08/14/2020   s/p TAVR with a 26 mm Edwards S3U via the left subclavian approach by Dr. Cornelius Moras and Dr. Clifton James   Severe aortic stenosis    Severe obesity (BMI >= 40) (HCC) 10/06/2014   Sleep apnea    Sleeps with 2 L O2 @ night   Type 2 diabetes mellitus (HCC)    Urothelial cancer (HCC) 03/10/2017   Incidental bladder mass noted on CT, papillary urothelial CA on pathology from excision 12/2016; post-op instillation of chemo. Due for f/u cystoscopy 03/2017   Venous insufficiency 09/10/2018   Ventral hernia    Ventral hernia without obstruction or gangrene 05/15/2017   Vitamin D deficiency disease    Allergies:  Allergies  Allergen Reactions   Contrast Media [Iodinated Contrast Media] Anaphylaxis, Shortness Of Breath and Other (See Comments)    Could not breath   Iohexol Anaphylaxis, Shortness Of Breath and Other (See Comments)    Immediately could not breathe   Lisinopril Anaphylaxis, Shortness Of Breath and Rash   Sulfa Antibiotics Anaphylaxis and Other (See Comments)    Historical from mother, pt states that mother says she almost died from this drug   Latex Other (See Comments)    Unless against on skin for a long time, blisters. Short term is okay.   Codeine Nausea Only, Anxiety and Other (See Comments)    insomnia   Levofloxacin Other  (See Comments)    insomnia   Lipitor [Atorvastatin Calcium] Rash   Meloxicam Rash    Broke out in a rash on her stomach,back, legs, and behind ears.   Nickel Other (See Comments)    With earrings pt has soreness and drainage from piercing   Vicodin [Hydrocodone-Acetaminophen] Itching     Surgical History:  She  has a past surgical history that includes Colonoscopy (2009, 08/2010, 12/2015); Breast lumpectomy with radioactive seed localization (Left, 12/22/2016); Transurethral resection of bladder tumor with mitomycin-c (N/A, 01/12/2017); Cystoscopy w/ retrogrades (Bilateral, 01/12/2017); Cystoscopy (04/23/2017); Laparoscopic cholecystectomy (2003); Hernia repair; Umbilical hernia repair (2003); Abdominal hernia repair (05/15/2017); Breast biopsy (Right, 1992); Breast biopsy (Left, 2018); Tympanostomy tube placement (Bilateral, 1980s); Ventral hernia repair (N/A, 05/15/2017); Insertion of mesh (N/A, 05/15/2017); Breast excisional biopsy (Left, 2018); Mastectomy (Right, 1992); Cataract extraction, bilateral (Bilateral, R 03/16/2019 L 03/30/2019); Femur IM nail (Right, 08/24/2019); RIGHT/LEFT HEART CATH AND CORONARY ANGIOGRAPHY (N/A, 07/05/2020); Eye surgery; Cardiac catheterization; TEE without cardioversion (N/A, 08/14/2020); and Endovenous ablation saphenous vein w/ laser (Right, 01/09/2022). Family History:  Her family history includes ADD / ADHD in her son; Allergies in her sister and sister; Arthritis in her brother, sister, and sister; Asthma in her sister and sister; Breast cancer (age of onset: 45) in her sister; Cancer in her maternal grandmother and another family member; Depression in her son; Diabetes in her father; Fibromyalgia in her sister and sister; Hashimoto's thyroiditis in her sister and sister; Heart disease in her father and mother; Hyperlipidemia in her sister and sister; Hypertension in her father and sister; Kidney disease in her sister; Other in her niece; Skin cancer in her sister;  Stroke (age of onset: 1) in her paternal grandmother; Tuberculosis in her maternal grandfather; Varicose Veins in her sister.  REVIEW OF SYSTEMS  : All other systems reviewed and negative except where noted in the History of Present Illness.  PHYSICAL EXAM: BP 128/60 (BP  Location: Left Arm, Patient Position: Sitting, Cuff Size: Large)   Pulse 76   Ht 5\' 2"  (1.575 m)   Wt 226 lb 2 oz (102.6 kg)   LMP  (LMP Unknown)   BMI 41.36 kg/m   General Appearance: Obese, chronically ill in no apparent distress. Head:   Normocephalic and atraumatic. Eyes:  sclerae anicteric,conjunctive pink  Respiratory: Diffuse decreased breath sounds without rales, rhonchi, wheezing. Cardio: RRR with systolic murmur peripheral pulses intact.  Abdomen: Soft,  Obese ,active bowel sounds. mild tenderness in the lower abdomen. Without guarding and Without rebound. No masses.  No shifting dullness.  Musculoskeletal: Full ROM, antalgic gait, able to get on table with some difficulty With edema. Skin: No jaundice dry and intact without significant lesions or rashes Neuro: Alert and  oriented x4;  No focal deficits.  No asterixis Psych:  Cooperative. Normal mood and affect.    Doree Albee, PA-C 11:32 AM

## 2023-07-03 NOTE — Progress Notes (Signed)
 Agree with assessment and plan as outlined.

## 2023-07-03 NOTE — H&P (View-Only) (Signed)
Agree with assessment and plan as outlined.  

## 2023-07-03 NOTE — Progress Notes (Signed)
 07/03/2023 TECLA MCCAGHREN [id number] 09-08-1945  Referring provider: Joselyn Arrow, MD Primary GI doctor: Dr. Adela Lank  ASSESSMENT AND PLAN:   Cirrhosis most likely secondary to MASH With PHTN, with thrombocytopenia.  No history of alcohol use.  HCC screening 04/29/2023 CT abdomen pelvis with contrast for abdominal pain and HCC screening showed diverticulosis without acute diverticulitis, cirrhosis with portal venous hypertension by splenomegaly and upper abdominal varices aortic atherosclerosis no liver lesions. No ascites aFP 3.1, INR 1.4, CBC platelets 131, normal AST, ALT, alk phos and total bilirubin.  BUN 16, creatinine 0.64 MELD 3.0: 11  Patient scheduled for EGD colonoscopy in hospital setting with Dr. Adela Lank Will need follow-up after that for continuing cirrhosis surveillance  Chronic Constipation-on Ozempic Chronic constipation with recent change in bowel habits since May 2024. Reports hard, pellet-like stools daily with occasional loose stools.  -Start MiraLAX with fiber to improve bowel movements. Repeating colon at the hospital with screening EGD  Gastroesophageal Reflux Disease (GERD) Poorly controlled GERD with infrequent use of omeprazole. Reports tasting previous meals and occasional nausea. She has upper AB pain, worse after dinner, states she will have nausea and does not want food  -omeprazole 40mg , will increase to twice a day -Weight loss -Lifestyle changes given to the patient -Possible gastroparesis component, diet given  Pulmonary Embolism (PE) History of PE in March 2023.  Currently on Eliquis 2.5mg  twice daily. We have permission to hold before the EGD/Colon  Chronic Obstructive Pulmonary Disease (COPD) with Sleep Apnea On 2 liters of oxygen at night. No CPAP. Reports occasional shortness of breath with exertion. -Continue current management.   Patient Care Team: Joselyn Arrow, MD as PCP - General (Family Medicine) Croitoru, Rachelle Hora, MD as PCP  - Cardiology (Cardiology) Joselyn Arrow, MD as Referring Physician (Family Medicine) Verner Chol, [facility] (Inactive) as Pharmacist (Pharmacist) Raymondo Band as Physician Assistant (Hematology and Oncology) Charna Elizabeth, MD as Consulting Physician (Gastroenterology) Griselda Miner, MD as Consulting Physician (General Surgery) Heloise Purpura, MD as Consulting Physician (Urology) Marcene Corning, MD as Consulting Physician (Orthopedic Surgery) Luciano Cutter, MD as Consulting Physician (Pulmonary Disease) Samson Frederic, MD as Consulting Physician (Orthopedic Surgery) Yolonda Kida, MD as Consulting Physician (Orthopedic Surgery)  HISTORY OF PRESENT ILLNESS: 78 y.o. female with a past medical history of status post TAVR 2022, most recent echo 09/13/2021 shows normal structure of aortic valve prosthesis, EF 55 to 60%, COPD, aortic atherosclerosis, PE 09/13/2021 on Eliquis 2.5 mg twice daily, bladder cancer 2018, right breast cancer 1992, cirrhosis noted 08/2016 with thrombocytopenia, hypothyroidism, sleep apnea on 2 L oxygen at night and others listed below presents for evaluation of abdominal pain and swelling.   Patient previously followed by Dr. Loreta Ave.  Last office visit there was 2017 for diverticulosis, NASH, personal history of colon polyps, ventral hernia  01/23/2016 colonoscopy showed diverticula sigmoid colon, small internal hemorrhoids otherwise unremarkable recall 7 years. 03/27/2023 labs reviewed show no anemia no leukocytosis patient does have a chronic thrombocytopenia from 2023, liver function unremarkable normal kidney 2023 CT chest showed cirrhotic liver with nodular contours  04/28/2023 office visit with myself for cirrhosis and change in bowel habits started on MiraLAX for potential overflow diarrhea due to Ozempic use scheduled for EGD and colonoscopy at the hospital 07/20/2023 [facility] screening 04/29/2023 CT abdomen pelvis with contrast for abdominal pain and HCC  screening showed diverticulosis without acute diverticulitis, cirrhosis with portal venous hypertension by splenomegaly and upper abdominal varices aortic atherosclerosis no liver lesions.  Labs from last visit aFP 3.1, INR 1.4, CBC platelets 131, normal AST, ALT, alk phos and total bilirubin.  BUN 16, creatinine 0.64 MELD 3.0: 11   She continues to have epigastric AB pain, worse after dinner, occ unable to eat though she is hungry. Can have nausea, no vomiting, Can have epigastric pain that wakes her in the night. She just recently started miralax for just last 3 days, still having constipation.  Denies confusion. Denies swelling in AB or legs.  No melena, no hematochezia.   She  reports that she quit smoking about 12 years ago. Her smoking use included cigarettes. She started smoking about 58 years ago. She has a 46 pack-year smoking history. She has never used smokeless tobacco. She reports that she does not drink alcohol and does not use drugs.  RELEVANT LABS AND IMAGING: CBC    Component Value Date/Time   WBC 5.6 04/28/2023 1544   RBC 4.75 04/28/2023 1544   HGB 13.7 04/28/2023 1544   HGB 13.8 03/27/2023 1112   HGB 13.4 09/23/2021 1317   HCT 43.2 04/28/2023 1544   HCT 39.1 09/23/2021 1317   PLT 131.0 (L) 04/28/2023 1544   PLT 122 (L) 03/27/2023 1112   PLT 148 (L) 09/23/2021 1317   MCV 91.0 04/28/2023 1544   MCV 89 09/23/2021 1317   MCH 30.2 03/27/2023 1112   MCHC 31.8 04/28/2023 1544   RDW 16.0 (H) 04/28/2023 1544   RDW 13.4 09/23/2021 1317   LYMPHSABS 1.0 04/28/2023 1544   LYMPHSABS 1.0 09/23/2021 1317   MONOABS 0.4 04/28/2023 1544   EOSABS 0.1 04/28/2023 1544   EOSABS 0.1 09/23/2021 1317   BASOSABS 0.1 04/28/2023 1544   BASOSABS 0.1 09/23/2021 1317   Recent Labs    09/25/22 1049 03/27/23 1112 04/28/23 1544  HGB 13.1 13.8 13.7    CMP     Component Value Date/Time   NA 138 04/28/2023 1544   NA 140 12/09/2021 0855   K 3.7 04/28/2023 1544   CL 104 04/28/2023 1544    CO2 28 04/28/2023 1544   GLUCOSE 100 (H) 04/28/2023 1544   BUN 16 04/28/2023 1544   BUN 15 12/09/2021 0855   CREATININE 0.64 04/28/2023 1544   CREATININE 0.70 03/27/2023 1112   CREATININE 0.75 03/05/2017 0736   CALCIUM 9.1 04/28/2023 1544   PROT 6.8 04/28/2023 1544   PROT 6.2 12/09/2021 0855   ALBUMIN 3.9 04/28/2023 1544   ALBUMIN 4.0 12/09/2021 0855   AST 17 04/28/2023 1544   AST 21 03/27/2023 1112   ALT 12 04/28/2023 1544   ALT 12 03/27/2023 1112   ALKPHOS 61 04/28/2023 1544   BILITOT 1.1 04/28/2023 1544   BILITOT 1.2 03/27/2023 1112   GFRNONAA >60 03/27/2023 1112   GFRAA 97 07/02/2020 1149      Latest Ref Rng & Units 04/28/2023    3:44 PM 03/27/2023   11:12 AM 09/25/2022   10:49 AM  Hepatic Function  Total Protein 6.0 - 8.3 g/dL 6.8  6.9  6.8   Albumin 3.5 - 5.2 g/dL 3.9  3.9  3.9   AST 0 - 37 U/L 17  21  21    ALT 0 - 35 U/L 12  12  11    Alk Phosphatase 39 - 117 U/L 61  62  65   Total Bilirubin 0.2 - 1.2 mg/dL 1.1  1.2  1.1       Latest Ref Rng & Units 04/28/2023    3:44 PM  Hepatitis C  AFP  ng/mL 3.1     Current Medications:   Current Outpatient Medications (Endocrine & Metabolic):    metFORMIN (GLUCOPHAGE-XR) 500 MG 24 hr tablet, Take 1 tablet (500 mg total) by mouth daily with breakfast.   Semaglutide,0.25 or 0.5MG /DOS, (OZEMPIC, 0.25 OR 0.5 MG/DOSE,) 2 MG/1.5ML SOPN, Inject 0.5 mg into the skin once a week.  Current Outpatient Medications (Cardiovascular):    bisoprolol-hydrochlorothiazide (ZIAC) 10-6.25 MG tablet, TAKE 1 TABLET EVERY DAY   simvastatin (ZOCOR) 20 MG tablet, Take 1 tablet (20 mg total) by mouth at bedtime.  Current Outpatient Medications (Respiratory):    albuterol (PROVENTIL) (2.5 MG/3ML) 0.083% nebulizer solution, Take 3 mLs (2.5 mg total) by nebulization every 6 (six) hours as needed for wheezing or shortness of breath.   albuterol (VENTOLIN HFA) 108 (90 Base) MCG/ACT inhaler, Inhale 2 puffs into the lungs every 6 (six) hours as needed  for wheezing or shortness of breath.   cetirizine (ZYRTEC) 10 MG tablet, Take 10 mg by mouth daily.   Fluticasone-Umeclidin-Vilant (TRELEGY ELLIPTA) 100-62.5-25 MCG/ACT AEPB, Inhale 1 puff into the lungs daily.   guaiFENesin (MUCINEX) 600 MG 12 hr tablet, Take 600 mg by mouth at bedtime.   montelukast (SINGULAIR) 10 MG tablet, Take 1 tablet (10 mg total) by mouth at bedtime.  Current Outpatient Medications (Analgesics):    acetaminophen (TYLENOL) 500 MG tablet, Take 500-1,000 mg by mouth every 6 (six) hours as needed for moderate pain (pain score 4-6) or headache.  Current Outpatient Medications (Hematological):    apixaban (ELIQUIS) 2.5 MG TABS tablet, Take 1 tablet (2.5 mg total) by mouth 2 (two) times daily.  Current Outpatient Medications (Other):    ACCU-CHEK AVIVA PLUS test strip, TEST BLOOD SUGAR EVERY DAY   Alcohol Swabs (B-D SINGLE USE SWABS REGULAR) PADS, Use twice a day when checking blood sugars   clobetasol cream (TEMOVATE) 0.05 %, Apply 1 Application topically 2 (two) times daily.   escitalopram (LEXAPRO) 10 MG tablet, Take 1 tablet (10 mg total) by mouth daily.   Multiple Vitamins-Minerals (PRESERVISION AREDS 2 PO), Take 2 each by mouth daily.   TRUEplus Lancets 30G MISC, TEST BLOOD SUGAR EVERY DAY   valACYclovir (VALTREX) 500 MG tablet, TAKE 1 TABLET EVERY DAY   amoxicillin (AMOXIL) 500 MG capsule, Take 1,000 mg by mouth 2 (two) times daily. (Patient not taking: Reported on 07/03/2023)   omeprazole (PRILOSEC) 40 MG capsule, Take 1 capsule (40 mg total) by mouth 2 (two) times daily.  Medical History:  Past Medical History:  Diagnosis Date   Arthritis    "hands" (05/15/2017)   Bladder cancer (HCC) 2018   Breast cancer, right (HCC) 1992   DCIS,bladder ca (just dx)   Colon polyp    Complication of anesthesia 1992   "local anesthesia" used was hard to awaken from-no problems since (05/15/2017)   COPD (chronic obstructive pulmonary disease) (HCC)    Coronary artery disease     COVID-19 virus infection 05/05/2019   Diverticulosis    Elevated liver enzymes    fatty liver per ultrasound per pt   FHx: BRCA2 gene positive    sister with BRCA2 mutation (pt tested NEGATIVE)   HLD (hyperlipidemia)    HSV (herpes simplex virus) infection    on hip--on daily suppression   Hypertension    Hypothyroidism    took med 7 yrs after birth of 1st child   Impaired glucose tolerance    Migraine    Nonalcoholic steatohepatitis (NASH) 03/23/2022   On home oxygen therapy    "  have it available but I'm not using it" (05/15/2017)   Osteopenia    Personal history of breast cancer 11/15/2021   S/P TAVR (transcatheter aortic valve replacement) 08/14/2020   s/p TAVR with a 26 mm Edwards S3U via the left subclavian approach by Dr. Cornelius Moras and Dr. Clifton James   Severe aortic stenosis    Severe obesity (BMI >= 40) (HCC) 10/06/2014   Sleep apnea    Sleeps with 2 L O2 @ night   Type 2 diabetes mellitus (HCC)    Urothelial cancer (HCC) 03/10/2017   Incidental bladder mass noted on CT, papillary urothelial CA on pathology from excision 12/2016; post-op instillation of chemo. Due for f/u cystoscopy 03/2017   Venous insufficiency 09/10/2018   Ventral hernia    Ventral hernia without obstruction or gangrene 05/15/2017   Vitamin D deficiency disease    Allergies:  Allergies  Allergen Reactions   Contrast Media [Iodinated Contrast Media] Anaphylaxis, Shortness Of Breath and Other (See Comments)    Could not breath   Iohexol Anaphylaxis, Shortness Of Breath and Other (See Comments)    Immediately could not breathe   Lisinopril Anaphylaxis, Shortness Of Breath and Rash   Sulfa Antibiotics Anaphylaxis and Other (See Comments)    Historical from mother, pt states that mother says she almost died from this drug   Latex Other (See Comments)    Unless against on skin for a long time, blisters. Short term is okay.   Codeine Nausea Only, Anxiety and Other (See Comments)    insomnia   Levofloxacin Other  (See Comments)    insomnia   Lipitor [Atorvastatin Calcium] Rash   Meloxicam Rash    Broke out in a rash on her stomach,back, legs, and behind ears.   Nickel Other (See Comments)    With earrings pt has soreness and drainage from piercing   Vicodin [Hydrocodone-Acetaminophen] Itching     Surgical History:  She  has a past surgical history that includes Colonoscopy (2009, 08/2010, 12/2015); Breast lumpectomy with radioactive seed localization (Left, 12/22/2016); Transurethral resection of bladder tumor with mitomycin-c (N/A, 01/12/2017); Cystoscopy w/ retrogrades (Bilateral, 01/12/2017); Cystoscopy (04/23/2017); Laparoscopic cholecystectomy (2003); Hernia repair; Umbilical hernia repair (2003); Abdominal hernia repair (05/15/2017); Breast biopsy (Right, 1992); Breast biopsy (Left, 2018); Tympanostomy tube placement (Bilateral, 1980s); Ventral hernia repair (N/A, 05/15/2017); Insertion of mesh (N/A, 05/15/2017); Breast excisional biopsy (Left, 2018); Mastectomy (Right, 1992); Cataract extraction, bilateral (Bilateral, R 03/16/2019 L 03/30/2019); Femur IM nail (Right, 08/24/2019); RIGHT/LEFT HEART CATH AND CORONARY ANGIOGRAPHY (N/A, 07/05/2020); Eye surgery; Cardiac catheterization; TEE without cardioversion (N/A, 08/14/2020); and Endovenous ablation saphenous vein w/ laser (Right, 01/09/2022). Family History:  Her family history includes ADD / ADHD in her son; Allergies in her sister and sister; Arthritis in her brother, sister, and sister; Asthma in her sister and sister; Breast cancer (age of onset: 45) in her sister; Cancer in her maternal grandmother and another family member; Depression in her son; Diabetes in her father; Fibromyalgia in her sister and sister; Hashimoto's thyroiditis in her sister and sister; Heart disease in her father and mother; Hyperlipidemia in her sister and sister; Hypertension in her father and sister; Kidney disease in her sister; Other in her niece; Skin cancer in her sister;  Stroke (age of onset: 1) in her paternal grandmother; Tuberculosis in her maternal grandfather; Varicose Veins in her sister.  REVIEW OF SYSTEMS  : All other systems reviewed and negative except where noted in the History of Present Illness.  PHYSICAL EXAM: BP 128/60 (BP  Location: Left Arm, Patient Position: Sitting, Cuff Size: Large)   Pulse 76   Ht 5\' 2"  (1.575 m)   Wt 226 lb 2 oz (102.6 kg)   LMP  (LMP Unknown)   BMI 41.36 kg/m   General Appearance: Obese, chronically ill in no apparent distress. Head:   Normocephalic and atraumatic. Eyes:  sclerae anicteric,conjunctive pink  Respiratory: Diffuse decreased breath sounds without rales, rhonchi, wheezing. Cardio: RRR with systolic murmur peripheral pulses intact.  Abdomen: Soft,  Obese ,active bowel sounds. mild tenderness in the lower abdomen. Without guarding and Without rebound. No masses.  No shifting dullness.  Musculoskeletal: Full ROM, antalgic gait, able to get on table with some difficulty With edema. Skin: No jaundice dry and intact without significant lesions or rashes Neuro: Alert and  oriented x4;  No focal deficits.  No asterixis Psych:  Cooperative. Normal mood and affect.    Doree Albee, PA-C 11:32 AM

## 2023-07-08 NOTE — Progress Notes (Addendum)
 DATE:  07-09-2023  TIME CALLED:  1030                        Left Message: []  Yes    [x]   No, spoke to patient  RIGHT ARM IS RESTRICTED d/t breast cancer surgery.   PCP- Annabelle Fetters, MD  Cardiologist-  Jerel Balding, MD  Other:   EKG- 06-03-2023  Epic Echo- 09-14-2021  Epic Cath- 07-05-2020  Epic Stress- Lexiscan  03-22-2015  Epic ICD / PPM- none  Allergies: CONTRAST MEDIA, IOHEXOL , LATEX  Blood thinner-  Eliquis    Hold x 2 days  pt aware ASA-none GLP-1- Semiglutide inject on Friday  hold x 7days  Hgb A1c:  5.8 on 06-03-2023   Surgeon: S. Armbruster, MD  Date of Endoscopy: 07-20-2023 Time of Endoscopy: 0930    Arrival time: 0800       Patient aware  [x]   Yes    []  No Type of Procedure:    [x]  Colonoscopy w/ Propofol         [x]   Other:  with EGD   Special Needs: home O2 at night only, per [facility]  2L (was rx'd 3L but had nosebleeds and went back to 2L)  Need INTERPRETER:  [] Yes   [x]   No       LANGUAGE: _____      Name / phone Number of Designated Driver to Home: Patsye Sullivant    [phone]   Height  5'2      Weight: 226 lbs   102.513  kg  Did you receive instructions from the Surgeon's office?   [x] Yes      []  No      What is your NPO status?     [x]   NPO MN      []    Other:      Did you receive the Bowel Prep from your Surgeon's office? [x]   Yes     []   No   has bowel prep items already,    Do you understand this information?   [x]   Yes    []    No      PLEASE CALL:    Patient aware.  [x]  Yes  []   No PHARMACY (to reconcile your Medications and correct errors):  [phone] ADMITTING OFFICE: (To complete your registration, Insurance information):                     [phone] or [phone]    Medical / Surgical Hx: HTN, CAD, TAVR 08-14-2020, Adine), NASH- cirrhosis, hx PE, hx bladder and breast cancer, OSA- home O2, COPD, thrombocytopenia,   Medication on DOS:  Omeprazole  (Prilosec), escitalopram  (Lexapro ),  Tylenol  if needed and may use Albuterol  Inhaler  (Bring)   Sent for  Anesthesia Review: [x]   Yes   []   No

## 2023-07-09 ENCOUNTER — Encounter (HOSPITAL_COMMUNITY): Payer: Self-pay | Admitting: Gastroenterology

## 2023-07-15 DIAGNOSIS — H353122 Nonexudative age-related macular degeneration, left eye, intermediate dry stage: Secondary | ICD-10-CM | POA: Diagnosis not present

## 2023-07-18 ENCOUNTER — Other Ambulatory Visit (HOSPITAL_BASED_OUTPATIENT_CLINIC_OR_DEPARTMENT_OTHER): Payer: Self-pay | Admitting: Pulmonary Disease

## 2023-07-20 ENCOUNTER — Encounter (HOSPITAL_COMMUNITY)
Admission: RE | Disposition: A | Discharge status: Home / Self Care | Payer: Self-pay | Source: Ambulatory Visit | Attending: Gastroenterology

## 2023-07-20 ENCOUNTER — Encounter (HOSPITAL_COMMUNITY): Payer: Self-pay | Admitting: Gastroenterology

## 2023-07-20 ENCOUNTER — Ambulatory Visit (HOSPITAL_COMMUNITY): Payer: Medicare HMO | Admitting: Anesthesiology

## 2023-07-20 ENCOUNTER — Other Ambulatory Visit: Payer: Self-pay

## 2023-07-20 ENCOUNTER — Ambulatory Visit (HOSPITAL_COMMUNITY)
Admission: RE | Admit: 2023-07-20 | Discharge: 2023-07-20 | Disposition: A | Discharge status: Home / Self Care | Payer: Medicare HMO | Source: Ambulatory Visit | Attending: Gastroenterology | Admitting: Gastroenterology

## 2023-07-20 DIAGNOSIS — Z1211 Encounter for screening for malignant neoplasm of colon: Secondary | ICD-10-CM

## 2023-07-20 DIAGNOSIS — K635 Polyp of colon: Secondary | ICD-10-CM

## 2023-07-20 DIAGNOSIS — E119 Type 2 diabetes mellitus without complications: Secondary | ICD-10-CM | POA: Diagnosis not present

## 2023-07-20 DIAGNOSIS — D124 Benign neoplasm of descending colon: Secondary | ICD-10-CM

## 2023-07-20 DIAGNOSIS — D126 Benign neoplasm of colon, unspecified: Secondary | ICD-10-CM

## 2023-07-20 DIAGNOSIS — G709 Myoneural disorder, unspecified: Secondary | ICD-10-CM | POA: Insufficient documentation

## 2023-07-20 DIAGNOSIS — I2510 Atherosclerotic heart disease of native coronary artery without angina pectoris: Secondary | ICD-10-CM | POA: Insufficient documentation

## 2023-07-20 DIAGNOSIS — Z7984 Long term (current) use of oral hypoglycemic drugs: Secondary | ICD-10-CM | POA: Insufficient documentation

## 2023-07-20 DIAGNOSIS — Z8619 Personal history of other infectious and parasitic diseases: Secondary | ICD-10-CM | POA: Insufficient documentation

## 2023-07-20 DIAGNOSIS — K5909 Other constipation: Secondary | ICD-10-CM | POA: Diagnosis not present

## 2023-07-20 DIAGNOSIS — Z87891 Personal history of nicotine dependence: Secondary | ICD-10-CM | POA: Diagnosis not present

## 2023-07-20 DIAGNOSIS — K2950 Unspecified chronic gastritis without bleeding: Secondary | ICD-10-CM | POA: Diagnosis not present

## 2023-07-20 DIAGNOSIS — Z952 Presence of prosthetic heart valve: Secondary | ICD-10-CM | POA: Insufficient documentation

## 2023-07-20 DIAGNOSIS — I272 Pulmonary hypertension, unspecified: Secondary | ICD-10-CM | POA: Diagnosis not present

## 2023-07-20 DIAGNOSIS — D123 Benign neoplasm of transverse colon: Secondary | ICD-10-CM | POA: Diagnosis not present

## 2023-07-20 DIAGNOSIS — K5730 Diverticulosis of large intestine without perforation or abscess without bleeding: Secondary | ICD-10-CM

## 2023-07-20 DIAGNOSIS — K5520 Angiodysplasia of colon without hemorrhage: Secondary | ICD-10-CM

## 2023-07-20 DIAGNOSIS — Z9981 Dependence on supplemental oxygen: Secondary | ICD-10-CM | POA: Insufficient documentation

## 2023-07-20 DIAGNOSIS — Z833 Family history of diabetes mellitus: Secondary | ICD-10-CM | POA: Insufficient documentation

## 2023-07-20 DIAGNOSIS — K449 Diaphragmatic hernia without obstruction or gangrene: Secondary | ICD-10-CM | POA: Diagnosis not present

## 2023-07-20 DIAGNOSIS — I10 Essential (primary) hypertension: Secondary | ICD-10-CM | POA: Insufficient documentation

## 2023-07-20 DIAGNOSIS — K2900 Acute gastritis without bleeding: Secondary | ICD-10-CM | POA: Diagnosis not present

## 2023-07-20 DIAGNOSIS — Z86711 Personal history of pulmonary embolism: Secondary | ICD-10-CM | POA: Insufficient documentation

## 2023-07-20 DIAGNOSIS — D122 Benign neoplasm of ascending colon: Secondary | ICD-10-CM

## 2023-07-20 DIAGNOSIS — E039 Hypothyroidism, unspecified: Secondary | ICD-10-CM | POA: Insufficient documentation

## 2023-07-20 DIAGNOSIS — Z853 Personal history of malignant neoplasm of breast: Secondary | ICD-10-CM | POA: Insufficient documentation

## 2023-07-20 DIAGNOSIS — K648 Other hemorrhoids: Secondary | ICD-10-CM | POA: Diagnosis not present

## 2023-07-20 DIAGNOSIS — Z955 Presence of coronary angioplasty implant and graft: Secondary | ICD-10-CM | POA: Insufficient documentation

## 2023-07-20 DIAGNOSIS — D6959 Other secondary thrombocytopenia: Secondary | ICD-10-CM | POA: Diagnosis not present

## 2023-07-20 DIAGNOSIS — K259 Gastric ulcer, unspecified as acute or chronic, without hemorrhage or perforation: Secondary | ICD-10-CM

## 2023-07-20 DIAGNOSIS — K219 Gastro-esophageal reflux disease without esophagitis: Secondary | ICD-10-CM | POA: Diagnosis not present

## 2023-07-20 DIAGNOSIS — Z7985 Long-term (current) use of injectable non-insulin antidiabetic drugs: Secondary | ICD-10-CM | POA: Diagnosis not present

## 2023-07-20 DIAGNOSIS — I700 Atherosclerosis of aorta: Secondary | ICD-10-CM | POA: Insufficient documentation

## 2023-07-20 DIAGNOSIS — M797 Fibromyalgia: Secondary | ICD-10-CM | POA: Insufficient documentation

## 2023-07-20 DIAGNOSIS — F419 Anxiety disorder, unspecified: Secondary | ICD-10-CM | POA: Insufficient documentation

## 2023-07-20 DIAGNOSIS — R195 Other fecal abnormalities: Secondary | ICD-10-CM

## 2023-07-20 DIAGNOSIS — J449 Chronic obstructive pulmonary disease, unspecified: Secondary | ICD-10-CM | POA: Diagnosis not present

## 2023-07-20 DIAGNOSIS — Z8349 Family history of other endocrine, nutritional and metabolic diseases: Secondary | ICD-10-CM | POA: Insufficient documentation

## 2023-07-20 DIAGNOSIS — K3189 Other diseases of stomach and duodenum: Secondary | ICD-10-CM | POA: Diagnosis not present

## 2023-07-20 DIAGNOSIS — K7460 Unspecified cirrhosis of liver: Secondary | ICD-10-CM | POA: Diagnosis not present

## 2023-07-20 DIAGNOSIS — M1990 Unspecified osteoarthritis, unspecified site: Secondary | ICD-10-CM | POA: Insufficient documentation

## 2023-07-20 DIAGNOSIS — G4730 Sleep apnea, unspecified: Secondary | ICD-10-CM | POA: Insufficient documentation

## 2023-07-20 DIAGNOSIS — Z8261 Family history of arthritis: Secondary | ICD-10-CM | POA: Insufficient documentation

## 2023-07-20 DIAGNOSIS — Z8249 Family history of ischemic heart disease and other diseases of the circulatory system: Secondary | ICD-10-CM | POA: Insufficient documentation

## 2023-07-20 HISTORY — PX: POLYPECTOMY: SHX5525

## 2023-07-20 HISTORY — DX: Fibromyalgia: M79.7

## 2023-07-20 HISTORY — DX: Anxiety disorder, unspecified: F41.9

## 2023-07-20 HISTORY — PX: COLONOSCOPY WITH PROPOFOL: SHX5780

## 2023-07-20 HISTORY — PX: BIOPSY: SHX5522

## 2023-07-20 HISTORY — PX: ESOPHAGOGASTRODUODENOSCOPY (EGD) WITH PROPOFOL: SHX5813

## 2023-07-20 LAB — GLUCOSE, CAPILLARY
Glucose-Capillary: 105 mg/dL — ABNORMAL HIGH (ref 70–99)
Glucose-Capillary: 110 mg/dL — ABNORMAL HIGH (ref 70–99)

## 2023-07-20 SURGERY — COLONOSCOPY WITH PROPOFOL
Anesthesia: Monitor Anesthesia Care

## 2023-07-20 MED ORDER — GLYCOPYRROLATE 0.2 MG/ML IJ SOLN
INTRAMUSCULAR | Status: DC | PRN
  Administered 2023-07-20: .2 mg via INTRAVENOUS

## 2023-07-20 MED ORDER — LACTATED RINGERS IV SOLN: INTRAVENOUS | Status: DC | PRN

## 2023-07-20 MED ORDER — PROPOFOL 500 MG/50ML IV EMUL
INTRAVENOUS | Status: DC | PRN
  Administered 2023-07-20: 20 mg via INTRAVENOUS
  Administered 2023-07-20: 30 ug via INTRAVENOUS
  Administered 2023-07-20: 100 ug/kg/min via INTRAVENOUS

## 2023-07-20 MED ORDER — LIDOCAINE HCL (PF) 2 % IJ SOLN
INTRAMUSCULAR | Status: DC | PRN
  Administered 2023-07-20: 100 mg via INTRADERMAL

## 2023-07-20 MED ORDER — SODIUM CHLORIDE 0.9 % IV SOLN
INTRAVENOUS | Status: DC
Start: 2023-07-20 — End: 2023-07-20

## 2023-07-20 MED ORDER — PROPOFOL 1000 MG/100ML IV EMUL
INTRAVENOUS | Status: AC
  Filled 2023-07-20: qty 100

## 2023-07-20 MED ORDER — ESMOLOL HCL 100 MG/10ML IV SOLN
INTRAVENOUS | Status: DC | PRN
  Administered 2023-07-20: 10 mg via INTRAVENOUS

## 2023-07-20 SURGICAL SUPPLY — 24 items
BLOCK BITE 60FR ADLT L/F BLUE (MISCELLANEOUS) ×3 IMPLANT
ELECT REM PT RETURN 9FT ADLT (ELECTROSURGICAL)
ELECTRODE REM PT RTRN 9FT ADLT (ELECTROSURGICAL) IMPLANT
FCP BXJMBJMB 240X2.8X (CUTTING FORCEPS)
FLOOR PAD 36X40 (MISCELLANEOUS) ×2
FORCEP RJ3 GP 1.8X160 W-NEEDLE (CUTTING FORCEPS) IMPLANT
FORCEPS BIOP RAD 4 LRG CAP 4 (CUTTING FORCEPS) IMPLANT
FORCEPS BIOP RJ4 240 W/NDL (CUTTING FORCEPS)
FORCEPS BXJMBJMB 240X2.8X (CUTTING FORCEPS) IMPLANT
INJECTOR/SNARE I SNARE (MISCELLANEOUS) IMPLANT
LUBRICANT JELLY 4.5OZ STERILE (MISCELLANEOUS) IMPLANT
MANIFOLD NEPTUNE II (INSTRUMENTS) IMPLANT
NDL SCLEROTHERAPY 25GX240 (NEEDLE) IMPLANT
NEEDLE SCLEROTHERAPY 25GX240 (NEEDLE) IMPLANT
PAD FLOOR 36X40 (MISCELLANEOUS) ×3 IMPLANT
PROBE APC STR FIRE (PROBE) IMPLANT
PROBE INJECTION GOLD 7FR (MISCELLANEOUS) IMPLANT
SNARE ROTATE MED OVAL 20MM (MISCELLANEOUS) IMPLANT
SNARE SHORT THROW 13M SML OVAL (MISCELLANEOUS) IMPLANT
SYR 50ML LL SCALE MARK (SYRINGE) IMPLANT
TRAP SPECIMEN MUCOUS 40CC (MISCELLANEOUS) IMPLANT
TUBING ENDO SMARTCAP PENTAX (MISCELLANEOUS) ×6 IMPLANT
TUBING IRRIGATION ENDOGATOR (MISCELLANEOUS) ×3 IMPLANT
WATER STERILE IRR 1000ML POUR (IV SOLUTION) IMPLANT

## 2023-07-20 NOTE — Anesthesia Preprocedure Evaluation (Addendum)
Anesthesia Evaluation  Patient identified by MRN, date of birth, ID band Patient awake    Reviewed: Allergy & Precautions, NPO status , Patient's Chart, lab work & pertinent test results  Airway Mallampati: I  TM Distance: >3 FB Neck ROM: Full    Dental  (+) Teeth Intact, Dental Advisory Given   Pulmonary sleep apnea , COPD, former smoker   breath sounds clear to auscultation       Cardiovascular hypertension, + CAD  + Valvular Problems/Murmurs AS  Rhythm:Regular Rate:Normal  Echo:  1. Left ventricular ejection fraction, by estimation, is 55 to 60%. The  left ventricle has normal function. The left ventricle has no regional  wall motion abnormalities. There is mild concentric left ventricular  hypertrophy. Left ventricular diastolic  parameters are consistent with Grade I diastolic dysfunction (impaired  relaxation).   2. Right ventricular systolic function is normal. The right ventricular  size is normal.   3. Left atrial size was mildly dilated.   4. The mitral valve is normal in structure. No evidence of mitral valve  regurgitation.   5. The aortic valve has been repaired/replaced. Aortic valve  regurgitation is not visualized. There is a 23 mm Ultra, stented (TAVR)  valve present in the aortic position. Procedure Date: 08/14/2020. Echo  findings are consistent with normal structure and   function of the aortic valve prosthesis. Aortic valve mean gradient  measures 13.0 mmHg. Aortic valve Vmax measures 2.18 m/s. Aortic valve  acceleration time measures 87 msec.     Neuro/Psych  Headaches PSYCHIATRIC DISORDERS Anxiety      Neuromuscular disease    GI/Hepatic ,GERD  ,,(+) Hepatitis -  Endo/Other  diabetesHypothyroidism    Renal/GU negative Renal ROS     Musculoskeletal  (+) Arthritis ,  Fibromyalgia -  Abdominal   Peds  Hematology   Anesthesia Other Findings   Reproductive/Obstetrics                              Anesthesia Physical Anesthesia Plan  ASA: 3  Anesthesia Plan: MAC   Post-op Pain Management: Minimal or no pain anticipated   Induction: Intravenous  PONV Risk Score and Plan: 0  Airway Management Planned: Natural Airway  Additional Equipment: None  Intra-op Plan:   Post-operative Plan:   Informed Consent: I have reviewed the patients History and Physical, chart, labs and discussed the procedure including the risks, benefits and alternatives for the proposed anesthesia with the patient or authorized representative who has indicated his/her understanding and acceptance.       Plan Discussed with: CRNA  Anesthesia Plan Comments:        Anesthesia Quick Evaluation

## 2023-07-20 NOTE — Anesthesia Postprocedure Evaluation (Signed)
Anesthesia Post Note  Patient: Shannon Schultz  Procedure(s) Performed: COLONOSCOPY WITH PROPOFOL ESOPHAGOGASTRODUODENOSCOPY (EGD) WITH PROPOFOL BIOPSY POLYPECTOMY     Patient location during evaluation: PACU Anesthesia Type: MAC Level of consciousness: awake and alert Pain management: pain level controlled Vital Signs Assessment: post-procedure vital signs reviewed and stable Respiratory status: spontaneous breathing, nonlabored ventilation and respiratory function stable Cardiovascular status: blood pressure returned to baseline and stable Postop Assessment: no apparent nausea or vomiting Anesthetic complications: no   No notable events documented.  Last Vitals:  Vitals:   07/20/23 1050 07/20/23 1100  BP: (!) 172/66 (!) 170/54  Pulse: 93 90  Resp: 14 12  Temp:    SpO2: 94% 95%    Last Pain:  Vitals:   07/20/23 1100  TempSrc:   PainSc: 0-No pain                 Lowella Curb

## 2023-07-20 NOTE — Transfer of Care (Signed)
Immediate Anesthesia Transfer of Care Note  Patient: Shannon Schultz  Procedure(s) Performed: COLONOSCOPY WITH PROPOFOL ESOPHAGOGASTRODUODENOSCOPY (EGD) WITH PROPOFOL BIOPSY POLYPECTOMY  Patient Location: PACU  Anesthesia Type:MAC  Level of Consciousness: awake, alert , and oriented  Airway & Oxygen Therapy: Patient Spontanous Breathing and Patient connected to face mask oxygen  Post-op Assessment: Report given to RN and Post -op Vital signs reviewed and stable  Post vital signs: Reviewed and stable  Last Vitals:  Vitals Value Taken Time  BP 164/91 07/20/23 1030  Temp    Pulse 113 07/20/23 1033  Resp 22 07/20/23 1033  SpO2 95 % 07/20/23 1033  Vitals shown include unfiled device data.  Last Pain:  Vitals:   07/20/23 0855  TempSrc: Temporal  PainSc: 0-No pain      Patients Stated Pain Goal: 0 (07/20/23 0855)  Complications: No notable events documented.

## 2023-07-20 NOTE — Op Note (Signed)
[facility] Patient Name: Shannon Schultz Procedure Date: 07/20/2023 MRN: [id number] Attending MD: Willaim Rayas. Adela Lank , MD, [id number] Date of Birth: April 07, 1946 CSN: [id number] Age: 78 Admit Type: Outpatient Procedure:                Colonoscopy Indications:              Positive fecal immunochemical test Providers:                Viviann Spare P. Adela Lank, MD, Lorenza Evangelist, RN,                            Rozetta Nunnery, Technician Referring MD:              Medicines:                Monitored Anesthesia Care Complications:            No immediate complications. Estimated blood loss:                            Minimal. Estimated Blood Loss:     Estimated blood loss was minimal. Procedure:                Pre-Anesthesia Assessment:                           - Prior to the procedure, a History and Physical                            was performed, and patient medications and                            allergies were reviewed. The patient's tolerance of                            previous anesthesia was also reviewed. The risks                            and benefits of the procedure and the sedation                            options and risks were discussed with the patient.                            All questions were answered, and informed consent                            was obtained. Prior Anticoagulants: The patient has                            taken Eliquis (apixaban), last dose was 2 days                            prior to procedure. ASA Grade Assessment: III - A  patient with severe systemic disease. After                            reviewing the risks and benefits, the patient was                            deemed in satisfactory condition to undergo the                            procedure.                           After obtaining informed consent, the colonoscope                            was passed under direct vision.  Throughout the                            procedure, the patient's blood pressure, pulse, and                            oxygen saturations were monitored continuously. The                            [id number]-HQ190L ([id number] Olympus colonoscope was                            introduced through the anus and advanced to the the                            cecum, identified by appendiceal orifice and                            ileocecal valve. The colonoscopy was performed                            without difficulty. The patient tolerated the                            procedure well. The quality of the bowel                            preparation was adequate. The ileocecal valve,                            appendiceal orifice, and rectum were photographed. Scope In: 9:53:36 AM Scope Out: 10:24:19 AM Scope Withdrawal Time: 0 hours 24 minutes 24 seconds  Total Procedure Duration: 0 hours 30 minutes 43 seconds  Findings:      The perianal and digital rectal examinations were normal.      A 5 mm polyp was found in the ascending colon. The polyp was sessile.       The polyp was removed with a cold snare. Resection and retrieval were       complete.      A few small angiodysplastic lesions  were found in the sigmoid colon and       in the ascending colon.      A 4 mm polyp was found in the hepatic flexure. The polyp was sessile.       The polyp was removed with a cold snare. Resection and retrieval were       complete.      Six sessile polyps were found in the transverse colon. The polyps were 3       to 6 mm in size. These polyps were removed with a cold snare. Resection       and retrieval were complete.      A 5 mm polyp was found in the descending colon. The polyp was sessile.       The polyp was removed with a cold snare. Resection and retrieval were       complete.      A 3 mm polyp was found in the sigmoid colon. The polyp was sessile. The       polyp was removed with a cold snare.  Resection and retrieval were       complete.      Multiple small-mouthed diverticula were found in the left colon.      Internal hemorrhoids were found during retroflexion.      The exam was otherwise without abnormality. Of note, the left colon did       not retain air well, which prolonged the exam. Impression:               - One 5 mm polyp in the ascending colon, removed                            with a cold snare. Resected and retrieved.                           - A few colonic angiodysplastic lesions.                           - One 4 mm polyp at the hepatic flexure, removed                            with a cold snare. Resected and retrieved.                           - Six 3 to 6 mm polyps in the transverse colon,                            removed with a cold snare. Resected and retrieved.                           - One 5 mm polyp in the descending colon, removed                            with a cold snare. Resected and retrieved.                           - One 3 mm polyp in the sigmoid colon, removed with  a cold snare. Resected and retrieved.                           - Diverticulosis in the sigmoid colon.                           - Internal hemorrhoids.                           - The examination was otherwise normal. Moderate Sedation:      No moderate sedation, case performed with MAC Recommendation:           - Patient has a contact number available for                            emergencies. The signs and symptoms of potential                            delayed complications were discussed with the                            patient. Return to normal activities tomorrow.                            Written discharge instructions were provided to the                            patient.                           - Resume previous diet.                           - Continue present medications.                           - Resume Eliquis  tomorrow.                           - Await pathology results. Procedure Code(s):        --- Professional ---                           [phone], Colonoscopy, flexible; with removal of                            tumor(s), polyp(s), or other lesion(s) by snare                            technique Diagnosis Code(s):        --- Professional ---                           D12.2, Benign neoplasm of ascending colon                           D12.3, Benign neoplasm of transverse colon (hepatic  flexure or splenic flexure)                           D12.4, Benign neoplasm of descending colon                           D12.5, Benign neoplasm of sigmoid colon                           K55.20, Angiodysplasia of colon without hemorrhage                           K64.8, Other hemorrhoids                           R19.5, Other fecal abnormalities                           K57.30, Diverticulosis of large intestine without                            perforation or abscess without bleeding CPT copyright 2022 American Medical Association. All rights reserved. The codes documented in this report are preliminary and upon coder review may  be revised to meet current compliance requirements. Viviann Spare P. Jonice Cerra, MD 07/20/2023 10:35:26 AM This report has been signed electronically. Number of Addenda: 0

## 2023-07-20 NOTE — Interval H&P Note (Signed)
History and Physical Interval Note: No interval changes since the office visit.  Patient is here for EGD and colonoscopy. EGD to screen for varices given history of cirrhosis. Colonoscopy to evaluate (+) FOBT, last colonoscopy 12/2015. She has held her Eliquis for 2 days prior to the exam and Ozempic for > 1 week. I have discussed risks / benefits of the exam and anesthesia with her and she wishes to proceed. She denies complaints otherwise today and is feeling well. Further recommendations pending the results. All questions answered. Exam unchanged.   07/20/2023 9:11 AM  Shannon Schultz  has presented today for surgery, with the diagnosis of +FOBT.  The various methods of treatment have been discussed with the patient and family. After consideration of risks, benefits and other options for treatment, the patient has consented to  Procedure(s): COLONOSCOPY WITH PROPOFOL (N/A) ESOPHAGOGASTRODUODENOSCOPY (EGD) WITH PROPOFOL (N/A) as a surgical intervention.  The patient's history has been reviewed, patient examined, no change in status, stable for surgery.  I have reviewed the patient's chart and labs.  Questions were answered to the patient's satisfaction.     Viviann Spare P Shenia Alan

## 2023-07-20 NOTE — Discharge Instructions (Signed)
 YOU HAD AN ENDOSCOPIC PROCEDURE TODAY: Refer to the procedure report and other information in the discharge instructions given to you for any specific questions about what was found during the examination. If this information does not answer your questions, please call [place] office at [phone] to clarify.   YOU SHOULD EXPECT: Some feelings of bloating in the abdomen. Passage of more gas than usual. Walking can help get rid of the air that was put into your GI tract during the procedure and reduce the bloating. If you had a lower endoscopy (such as a colonoscopy or flexible sigmoidoscopy) you may notice spotting of blood in your stool or on the toilet paper. Some abdominal soreness may be present for a day or two, also.  DIET: Your first meal following the procedure should be a light meal and then it is ok to progress to your normal diet. A half-sandwich or bowl of soup is an example of a good first meal. Heavy or fried foods are harder to digest and may make you feel nauseous or bloated. Drink plenty of fluids but you should avoid alcoholic beverages for 24 hours. If you had a esophageal dilation, please see attached instructions for diet.    ACTIVITY: Your care partner should take you home directly after the procedure. You should plan to take it easy, moving slowly for the rest of the day. You can resume normal activity the day after the procedure however YOU SHOULD NOT DRIVE, use power tools, machinery or perform tasks that involve climbing or major physical exertion for 24 hours (because of the sedation medicines used during the test).   SYMPTOMS TO REPORT IMMEDIATELY: A gastroenterologist can be reached at any hour. Please call [phone]  for any of the following symptoms:  Following lower endoscopy (colonoscopy, flexible sigmoidoscopy) Excessive amounts of blood in the stool  Significant tenderness, worsening of abdominal pains  Swelling of the abdomen that is new, acute  Fever of 100 or  higher  Following upper endoscopy (EGD, EUS, ERCP, esophageal dilation) Vomiting of blood or coffee ground material  New, significant abdominal pain  New, significant chest pain or pain under the shoulder blades  Painful or persistently difficult swallowing  New shortness of breath  Black, tarry-looking or red, bloody stools  FOLLOW UP:  If any biopsies were taken you will be contacted by phone or by letter within the next 1-3 weeks. Call [phone]  if you have not heard about the biopsies in 3 weeks.  Please also call with any specific questions about appointments or follow up tests.YOU HAD AN ENDOSCOPIC PROCEDURE TODAY: Refer to the procedure report and other information in the discharge instructions given to you for any specific questions about what was found during the examination. If this information does not answer your questions, please call [place] office at [phone] to clarify.   YOU SHOULD EXPECT: Some feelings of bloating in the abdomen. Passage of more gas than usual. Walking can help get rid of the air that was put into your GI tract during the procedure and reduce the bloating. If you had a lower endoscopy (such as a colonoscopy or flexible sigmoidoscopy) you may notice spotting of blood in your stool or on the toilet paper. Some abdominal soreness may be present for a day or two, also.  DIET: Your first meal following the procedure should be a light meal and then it is ok to progress to your normal diet. A half-sandwich or bowl of soup is an example of a  good first meal. Heavy or fried foods are harder to digest and may make you feel nauseous or bloated. Drink plenty of fluids but you should avoid alcoholic beverages for 24 hours. If you had a esophageal dilation, please see attached instructions for diet.    ACTIVITY: Your care partner should take you home directly after the procedure. You should plan to take it easy, moving slowly for the rest of the day. You can resume  normal activity the day after the procedure however YOU SHOULD NOT DRIVE, use power tools, machinery or perform tasks that involve climbing or major physical exertion for 24 hours (because of the sedation medicines used during the test).   SYMPTOMS TO REPORT IMMEDIATELY: A gastroenterologist can be reached at any hour. Please call [phone]  for any of the following symptoms:  Following lower endoscopy (colonoscopy, flexible sigmoidoscopy) Excessive amounts of blood in the stool  Significant tenderness, worsening of abdominal pains  Swelling of the abdomen that is new, acute  Fever of 100 or higher  Following upper endoscopy (EGD, EUS, ERCP, esophageal dilation) Vomiting of blood or coffee ground material  New, significant abdominal pain  New, significant chest pain or pain under the shoulder blades  Painful or persistently difficult swallowing  New shortness of breath  Black, tarry-looking or red, bloody stools  FOLLOW UP:  If any biopsies were taken you will be contacted by phone or by letter within the next 1-3 weeks. Call [phone]  if you have not heard about the biopsies in 3 weeks.  Please also call with any specific questions about appointments or follow up tests.

## 2023-07-20 NOTE — Interval H&P Note (Addendum)
History and Physical Interval Note:History and Physical Interval Note: No interval changes since the office visit.   Patient is here for EGD and colonoscopy. EGD to screen for varices given history of cirrhosis. Colonoscopy to evaluate (+) FOBT, last colonoscopy 12/2015. She has held her Eliquis for 2 days prior to the exam and Ozempic for > 1 week. I have discussed risks / benefits of the exam and anesthesia with her and she wishes to proceed. She denies complaints otherwise today and is feeling well. Further recommendations pending the results. All questions answered. Exam unchanged.  Exam done at the hospital for anesthesia support given supplemental oxygen use.      07/20/2023 9:13 AM  Shannon Schultz  has presented today for surgery, with the diagnosis of +FOBT.  The various methods of treatment have been discussed with the patient and family. After consideration of risks, benefits and other options for treatment, the patient has consented to  Procedure(s): COLONOSCOPY WITH PROPOFOL (N/A) ESOPHAGOGASTRODUODENOSCOPY (EGD) WITH PROPOFOL (N/A) as a surgical intervention.  The patient's history has been reviewed, patient examined, no change in status, stable for surgery.  I have reviewed the patient's chart and labs.  Questions were answered to the patient's satisfaction.     Viviann Spare P Jaquelinne Glendening

## 2023-07-20 NOTE — Op Note (Signed)
[name] Procedure Date: 07/20/2023 MRN: [id number] Attending MD: Willaim Rayas. Adela Lank , MD, [id number] Date of Birth: 08-13-1945 CSN: [id number] Age: 78 Admit Type: Outpatient Procedure:                Upper GI endoscopy Indications:              Cirrhosis rule out esophageal varices. Also with                            history of GERD, was on omeprazole once daily, just                            recently increased to twice daily Providers:                Willaim Rayas. Adela Lank, MD, Lorenza Evangelist, RN,                            Rozetta Nunnery, Technician Referring MD:              Medicines:                Monitored Anesthesia Care Complications:            No immediate complications. Estimated blood loss:                            Minimal. Estimated Blood Loss:     Estimated blood loss was minimal. Procedure:                Pre-Anesthesia Assessment:                           - Prior to the procedure, a History and Physical                            was performed, and patient medications and                            allergies were reviewed. The patient's tolerance of                            previous anesthesia was also reviewed. The risks                            and benefits of the procedure and the sedation                            options and risks were discussed with the patient.                            All questions were answered, and informed consent                            was obtained. Prior Anticoagulants: The patient has  taken Eliquis (apixaban), last dose was 2 days                            prior to procedure. ASA Grade Assessment: III - A                            patient with severe systemic disease. After                            reviewing the risks and benefits, the patient was                            deemed in satisfactory condition to undergo the                             procedure.                           After obtaining informed consent, the endoscope was                            passed under direct vision. Throughout the                            procedure, the patient's blood pressure, pulse, and                            oxygen saturations were monitored continuously. The                            [id number]-[id number] ([id number] Olympus endoscope was introduced                            through the mouth, and advanced to the second part                            of duodenum. The upper GI endoscopy was                            accomplished without difficulty. The patient                            tolerated the procedure well. Scope In: Scope Out: Findings:      Esophagogastric landmarks were identified: the Z-line was found at 40       cm, the gastroesophageal junction was found at 40 cm and the upper       extent of the gastric folds was found at 41 cm from the incisors.      A 1 cm hiatal hernia was present.      There was a suspected isolated vascular bleb in the mid esophagus, there       did not appear to be any varices associated with this. No stigmata for       bleeding noted. The exam of the esophagus was otherwise normal.  Two non-bleeding superficial gastric ulcers with no stigmata of bleeding       were found in the gastric fundus (5mm) and in the gastric body (3mm).       The largest lesion was 5 mm in largest dimension. There was some nodular       mucosa around the smaller ulcer. Biopsies were taken from this area with       a cold forceps for histology.      The exam of the stomach was otherwise normal. No gastric varices      Biopsies were taken with a cold forceps for Helicobacter pylori testing.      The examined duodenum was normal. Impression:               - Esophagogastric landmarks identified.                           - 1 cm hiatal hernia.                           - Benign appearing vascular bleb in the mid                             esophagus without any obvious varices otherwise, no                            high risk stigmata for bleeding.                           - Normal esophagus otherwise                           - Non-bleeding gastric ulcers x 2 with no stigmata                            of bleeding, one with some surrounding nodular                            mucosa. Biopsied.                           - Normal stomach otherwise - no gastric varices.                            Biopsies taken to rule out H pylori                           - Normal examined duodenum. Moderate Sedation:      No moderate sedation, case performed with MAC Recommendation:           - Patient has a contact number available for                            emergencies. The signs and symptoms of potential                            delayed complications were discussed with the  patient. Return to normal activities tomorrow.                            Written discharge instructions were provided to the                            patient.                           - Resume previous diet.                           - Continue present medications.                           - Continue omeprazole 40mg  twice daily                           - Avoid all NSAIDs                           - Resume Eliquis tomorrow                           - Await pathology results.                           - Consideration for repeat EGD in a few months to                            confirm mucosal healing of ulcers Procedure Code(s):        --- Professional ---                           [phone], Esophagogastroduodenoscopy, flexible,                            transoral; with biopsy, single or multiple Diagnosis Code(s):        --- Professional ---                           K44.9, Diaphragmatic hernia without obstruction or                            gangrene                           K25.9, Gastric ulcer,  unspecified as acute or                            chronic, without hemorrhage or perforation                           K74.60, Unspecified cirrhosis of liver CPT copyright 2022 American Medical Association. All rights reserved. The codes documented in this report are preliminary and upon coder review may  be revised to meet current compliance requirements. Viviann Spare P. Adela Lank, MD 07/20/2023 10:38:51 AM This report  has been signed electronically. Number of Addenda: 0

## 2023-07-21 ENCOUNTER — Encounter: Payer: Self-pay | Admitting: Gastroenterology

## 2023-07-21 LAB — SURGICAL PATHOLOGY

## 2023-07-22 ENCOUNTER — Encounter (HOSPITAL_COMMUNITY): Payer: Self-pay | Admitting: Gastroenterology

## 2023-07-23 ENCOUNTER — Telehealth (HOSPITAL_BASED_OUTPATIENT_CLINIC_OR_DEPARTMENT_OTHER): Payer: Self-pay | Admitting: Pulmonary Disease

## 2023-07-23 ENCOUNTER — Other Ambulatory Visit (HOSPITAL_BASED_OUTPATIENT_CLINIC_OR_DEPARTMENT_OTHER): Payer: Self-pay

## 2023-07-23 MED ORDER — APIXABAN 2.5 MG PO TABS: 2.5000 mg | ORAL_TABLET | Freq: Two times a day (BID) | ORAL | 1 refills | Status: DC

## 2023-07-23 NOTE — Telephone Encounter (Signed)
Refill sent in

## 2023-07-23 NOTE — Telephone Encounter (Signed)
Patient states [facility] has sent requests for refills for patients Eliquis and have not received anything from [place]. Checked Dr. Henderson Cloud box as of 11:45 today and did not see anything for the patient.  Patient is concerned because she is almost out of this medication.  Please advise and let patient know when this has been completed

## 2023-07-28 ENCOUNTER — Telehealth: Payer: Self-pay

## 2023-07-28 ENCOUNTER — Other Ambulatory Visit (HOSPITAL_COMMUNITY): Payer: Self-pay

## 2023-07-28 NOTE — Telephone Encounter (Signed)
*  [facility]  Pharmacy Patient Advocate Encounter   Received notification from CoverMyMeds that prior authorization for Eliquis 2.5MG  tablets  is required/requested.   Insurance verification completed.   The patient is insured through [employer] .   Per test claim: PA required; PA submitted to above mentioned insurance via CoverMyMeds Key/confirmation #/EOC [id number] Status is pending

## 2023-07-30 ENCOUNTER — Other Ambulatory Visit (HOSPITAL_COMMUNITY): Payer: Self-pay

## 2023-07-30 NOTE — Telephone Encounter (Signed)
Pharmacy Patient Advocate Encounter  Received notification from [facility] that Prior Authorization for Eliquis 2.5mg  has been APPROVED from 07/28/2023 to 06/28/2024. Unable to obtain price due to refill too soon rejection, last fill date 07/29/2023 next available fill date04/14/2025

## 2023-07-31 ENCOUNTER — Other Ambulatory Visit: Payer: Self-pay | Admitting: Family Medicine

## 2023-07-31 NOTE — Telephone Encounter (Signed)
Pt has been APPROVED on [employer] for 2025.

## 2023-07-31 NOTE — Telephone Encounter (Signed)
When reviewing her chart for this refill I noticed her next appointment is scheduled with Dr. Susann Givens. I am assuming that was a mistake and she is supposed to f/u with you for a med check.

## 2023-08-04 DIAGNOSIS — J449 Chronic obstructive pulmonary disease, unspecified: Secondary | ICD-10-CM | POA: Diagnosis not present

## 2023-08-04 DIAGNOSIS — R0981 Nasal congestion: Secondary | ICD-10-CM | POA: Diagnosis not present

## 2023-08-04 DIAGNOSIS — R070 Pain in throat: Secondary | ICD-10-CM | POA: Diagnosis not present

## 2023-08-12 ENCOUNTER — Ambulatory Visit (HOSPITAL_COMMUNITY): Payer: Medicare HMO | Attending: Cardiovascular Disease

## 2023-08-12 DIAGNOSIS — Z952 Presence of prosthetic heart valve: Secondary | ICD-10-CM | POA: Diagnosis not present

## 2023-08-12 LAB — ECHOCARDIOGRAM COMPLETE
AR max vel: 0.85 cm2
AV Area VTI: 0.82 cm2
AV Area mean vel: 0.8 cm2
AV Mean grad: 17 mm[Hg]
AV Peak grad: 28.1 mm[Hg]
Ao pk vel: 2.65 m/s
Area-P 1/2: 4.68 cm2
S' Lateral: 3.8 cm

## 2023-08-13 ENCOUNTER — Encounter: Payer: Self-pay | Admitting: Cardiovascular Disease

## 2023-08-15 ENCOUNTER — Other Ambulatory Visit: Payer: Self-pay | Admitting: Family Medicine

## 2023-08-15 DIAGNOSIS — I10 Essential (primary) hypertension: Secondary | ICD-10-CM

## 2023-08-15 DIAGNOSIS — B009 Herpesviral infection, unspecified: Secondary | ICD-10-CM

## 2023-08-27 ENCOUNTER — Ambulatory Visit
Admission: RE | Admit: 2023-08-27 | Discharge: 2023-08-27 | Disposition: A | Discharge status: Home / Self Care | Payer: Medicare HMO | Source: Ambulatory Visit | Attending: Family Medicine | Admitting: Family Medicine

## 2023-08-27 ENCOUNTER — Encounter: Payer: Self-pay | Admitting: Family Medicine

## 2023-08-27 ENCOUNTER — Ambulatory Visit: Payer: Medicare HMO | Admitting: Family Medicine

## 2023-08-27 ENCOUNTER — Other Ambulatory Visit: Payer: Self-pay | Admitting: Family Medicine

## 2023-08-27 VITALS — BP 150/80 | Ht 62.0 in | Wt 221.0 lb

## 2023-08-27 DIAGNOSIS — R0781 Pleurodynia: Secondary | ICD-10-CM

## 2023-08-27 DIAGNOSIS — T148XXA Other injury of unspecified body region, initial encounter: Secondary | ICD-10-CM

## 2023-08-27 DIAGNOSIS — M25551 Pain in right hip: Secondary | ICD-10-CM | POA: Diagnosis not present

## 2023-08-27 DIAGNOSIS — Z23 Encounter for immunization: Secondary | ICD-10-CM | POA: Diagnosis not present

## 2023-08-27 NOTE — Progress Notes (Signed)
 Chief Complaint  Patient presents with   Shannon Schultz yesterday 4pm while taking out recycling. Fell on thin rug on concrete, caught fall with right hand, which went into rib area. Patient believes she may have broken a rib. No injury to head. Right hip pain as well, this is hip that she has had surgery on.    She tripped and fell (toe caught on an edge, going out the back door) yesterday afternoon. She landed on right elbow/hand, also hurting her right hip and R lower ribs. She had significant skin tears. These occurred under her long sleeve shirt (not a dirty wound). Husband cleaned with peroxide.  She is concerned about pain at the R lateral hip, where she previously had fracture and had surgery. Hurts to stand. Also has pain at the lower ribs on the right side. She denies any shortness of breath.  She is on blood thinners. Denies any significant bleeding. Denies any head injury.   PMH, PSH, SH reviewed  Outpatient Encounter Medications as of 08/27/2023  Medication Sig Note   acetaminophen (TYLENOL) 500 MG tablet Take 500-1,000 mg by mouth every 6 (six) hours as needed for moderate pain (pain score 4-6) or headache.    apixaban (ELIQUIS) 2.5 MG TABS tablet Take 1 tablet (2.5 mg total) by mouth 2 (two) times daily. 08/27/2023: Has not taken yet today   bisoprolol-hydrochlorothiazide (ZIAC) 10-6.25 MG tablet TAKE 1 TABLET EVERY DAY 08/27/2023: Has not taken yet today    cetirizine (ZYRTEC) 10 MG tablet Take 10 mg by mouth daily. 08/27/2023: Has not taken yet today    clobetasol cream (TEMOVATE) 0.05 % Apply 1 Application topically 2 (two) times daily as needed (eczema). 08/27/2023: Used a week ago   escitalopram (LEXAPRO) 10 MG tablet Take 1 tablet (10 mg total) by mouth daily. 08/27/2023: Has not taken yet today    guaiFENesin (MUCINEX) 600 MG 12 hr tablet Take 600 mg by mouth at bedtime. 08/27/2023: Has not taken yet today    hydrocortisone cream 1 % Apply 1 Application topically 2 (two)  times daily as needed for itching.    metFORMIN (GLUCOPHAGE-XR) 500 MG 24 hr tablet TAKE 1 TABLET EVERY DAY WITH BREAKFAST 08/27/2023: Has not taken yet today    montelukast (SINGULAIR) 10 MG tablet Take 1 tablet (10 mg total) by mouth at bedtime. 08/27/2023: Has not taken yet today    Multiple Vitamins-Minerals (PRESERVISION AREDS 2 PO) Take 1 each by mouth 2 (two) times daily. 08/27/2023: Has not taken yet today    omeprazole (PRILOSEC) 40 MG capsule Take 1 capsule (40 mg total) by mouth 2 (two) times daily. 08/27/2023: Has not taken yet today    Semaglutide, 1 MG/DOSE, (OZEMPIC, 1 MG/DOSE,) 4 MG/3ML SOPN Inject 1 mg into the skin every Friday.    simvastatin (ZOCOR) 20 MG tablet Take 1 tablet (20 mg total) by mouth at bedtime. 08/27/2023: Has not taken yet today    TRELEGY ELLIPTA 100-62.5-25 MCG/ACT AEPB INHALE 1 PUFF INTO THE LUNGS DAILY. 08/27/2023: Has not taken yet today    TRUEplus Lancets 30G MISC TEST BLOOD SUGAR EVERY DAY    valACYclovir (VALTREX) 500 MG tablet TAKE 1 TABLET EVERY DAY 08/27/2023: Has not taken yet today    ACCU-CHEK AVIVA PLUS test strip TEST BLOOD SUGAR EVERY DAY (Patient not taking: Reported on 08/27/2023) 08/27/2023: Last took 3:30am 1000mg )   albuterol (PROVENTIL) (2.5 MG/3ML) 0.083% nebulizer solution Take 3 mLs (2.5 mg total) by nebulization every 6 (  six) hours as needed for wheezing or shortness of breath. (Patient not taking: Reported on 08/27/2023) 08/27/2023: As needed   albuterol (VENTOLIN HFA) 108 (90 Base) MCG/ACT inhaler Inhale 2 puffs into the lungs every 6 (six) hours as needed for wheezing or shortness of breath. (Patient not taking: Reported on 08/27/2023) 08/27/2023: As needed   Alcohol Swabs (B-D SINGLE USE SWABS REGULAR) PADS Use twice a day when checking blood sugars (Patient not taking: Reported on 08/27/2023)    amoxicillin (AMOXIL) 500 MG capsule Take 2,000 mg by mouth See admin instructions. Take 2000 mg 1 hour prior to dental work (Patient not taking:  Reported on 08/27/2023) 08/27/2023: Before dental appointment, used Monday     triamcinolone cream (KENALOG) 0.1 % Apply 1 Application topically 2 (two) times daily as needed (eczema/dry skin). (Patient not taking: Reported on 08/27/2023) 08/27/2023: As needed   No facility-administered encounter medications on file as of 08/27/2023.   Allergies  Allergen Reactions   Contrast Media [Iodinated Contrast Media] Anaphylaxis, Shortness Of Breath and Other (See Comments)    Could not breath   Iohexol Anaphylaxis, Shortness Of Breath and Other (See Comments)    Immediately could not breathe   Lisinopril Anaphylaxis, Shortness Of Breath and Rash   Sulfa Antibiotics Anaphylaxis and Other (See Comments)    Historical from mother, pt states that mother says she almost died from this drug   Latex Other (See Comments)    Unless against on skin for a long time, blisters. Short term is okay.   Codeine Nausea Only, Anxiety and Other (See Comments)    insomnia   Levofloxacin Other (See Comments)    insomnia   Lipitor [Atorvastatin Calcium] Rash   Meloxicam Rash    Broke out in a rash on her stomach,back, legs, and behind ears.   Nickel Other (See Comments)    With earrings pt has soreness and drainage from piercing   Vicodin [Hydrocodone-Acetaminophen] Itching    ROS: No f/c, HA, dizziness, CP, SOB. No n/v/d. R hip pain, R side/rib pain, pain at elbow (d/t skin tears), per HPI.   PHYSICAL EXAM:  BP (!) 150/80   Ht 5\' 2"  (1.575 m)   Wt 221 lb (100.2 kg) Comment: per patient  LMP  (LMP Unknown)   BMI 40.42 kg/m   Wt Readings from Last 3 Encounters:  08/27/23 221 lb (100.2 kg)  07/20/23 226 lb (102.5 kg)  07/03/23 226 lb 2 oz (102.6 kg)   Pleasant female, in no distress. She is in moderate pain when standing. HEENT: conjunctiva and sclera are clear, EOMI Neck: no lymphadenopathy ro mass Heart: regular rate and rhythm Lungs: clear bilaterally Abdomen: soft, nontender Tender at R inferior  lower ribs. No bony stepoffs. Tender at lateral right hip, not at groin. No bruising in these areas Multiple large skin tears to skin at R elbow  Wound was cleansed with saline. Carefully flattened out skin tears to re-cover the denuded area as best as possible. A few small steri-strips were placed to try and keep the overlying skin in place. Antibiotic ointment and nonstick gauze was applied, wrapped with gauze.   ASSESSMENT/PLAN:  Multiple skin tears - wound was cleansed, dressed. Reviewed proper wound care, and s/sx of wound infection - Plan: Td : Tetanus/diphtheria >7yo Preservative  free  Acute right hip pain - to check XR to r/o fracture, related to fall yesterday - Plan: DG HIP UNILAT W OR W/O PELVIS 2-3 VIEWS RIGHT  Rib pain on right side -  pt requests x-ray to see if broken.  Area of pain is at inferior/floating ribs. no bruising seen. Tylenol and topical meds/ice as needed - Plan: CANCELED: DG Ribs Unilateral Right  Need for tetanus booster - Plan: Td : Tetanus/diphtheria >7yo Preservative  free  Go to [facility] imaging at [employer] for x-rays.  Wash the wound once or twice daily with soapy water (no peroxide). Wash gently, trying to leave the overlying skin in place. Don't worry if the strip curl up and come off. They may only stay on for a day or two. Continue to use antibacterial ointment and non-stick gauze. You may leave it open to air once it isn't weepy/wet, but can cover for protection, if desired.  If you develop increasing pain, swelling, redness, fever, discolored drainage or crusting, this could be an indication of infection, and needs to be re-evaluated.

## 2023-08-27 NOTE — Patient Instructions (Signed)
 Go to [facility] imaging at [employer] for x-rays.  Wash the wound once or twice daily with soapy water (no peroxide). Wash gently, trying to leave the overlying skin in place. Don't worry if the strip curl up and come off. They may only stay on for a day or two. Continue to use antibacterial ointment and non-stick gauze. You may leave it open to air once it isn't weepy/wet, but can cover for protection, if desired.  If you develop increasing pain, swelling, redness, fever, discolored drainage or crusting, this could be an indication of infection, and needs to be re-evaluated.

## 2023-08-27 NOTE — Telephone Encounter (Signed)
 Called patient and scheduled her for 11:00 today.

## 2023-09-02 NOTE — Progress Notes (Unsigned)
 No chief complaint on file.  Patient presents for f/u on chronic problems, specifically on diabetes.  She was seen last week with a fall, pain in R ribs, R hip, and skin tears to RUE.  Diabetes: At her physical in 04/2021 she was started on Ozempic--to improve control of sugars, and also help with weight loss.  Metformin dose was decreased to 500mg  once daily. At her physical in December she was taking 0.5mg  dose of Ozempic.  Dose was increased to 1mg  to further help with weight loss. A1c was 5.8% on the 0.5mg  and metformin 500 mg daily.   Blood sugars are running    She denies side effects to the 1mg  dose of ozempic. Denies hypoglycemia (sugars normal when she felt slightly shaky), polydipsia and polyuria.      Hypertension: Reports compliance with bisoprolol HCT, and denies side effects. Allergic to lisinopril. Denies headaches, chest pain or dizziness.  Edema only in RLE occasionally. BP elevated at last visit, was in pain after fall.   BP Readings from Last 3 Encounters:  08/27/23 (!) 150/80  07/20/23 (!) 170/54  07/03/23 128/60     H/o aortic stenosis--she underwent TAVR 07/2020.  She was prescribed ABX for SBE prophylaxis by cardiologist. She denies syncope, angina, dyspnea (at baseline).  She was noted to have mild nonobstructive CAD on cardiac cath.  Last echo was 08/2023 Plan is to check a limited echo for aortic stenosis/TAVR gradients in 6 months   Echo 08/2023: IMPRESSIONS   1. Left ventricular ejection fraction, by estimation, is 55 to 60%. The  left ventricle has normal function. The left ventricle has no regional  wall motion abnormalities. There is mild left ventricular hypertrophy.  Left ventricular diastolic parameters  are consistent with Grade I diastolic dysfunction (impaired relaxation).  The average left ventricular global longitudinal strain is -16.4 %. The  global longitudinal strain is indeterminate.   2. Right ventricular systolic function is normal. The  right ventricular  size is normal. There is normal pulmonary artery systolic pressure. The  estimated right ventricular systolic pressure is 23.2 mmHg.   3. Mild mitral valve regurgitation.   4. Possible PPM. AV max 2.65 m/s. DI 0.29. EOA index 0.41. AT <176ms.  Aortic valve regurgitation is not visualized. There is a 26 mm Sapien  prosthetic (TAVR) valve present in the aortic position. Procedure Date:  07/2020. Aortic valve area, by VTI  measures 0.82 cm. Aortic valve mean gradient measures 17.0 mmHg. Aortic  valve Vmax measures 2.65 m/s.   5. The inferior vena cava is normal in size with greater than 50%  respiratory variability, suggesting right atrial pressure of 3 mmHg.   Conclusion(s)/Recommendation(s): Increased TAVR gradient, possible PPM.        She had seen GI in January for cirrhosis, constipation and diarrhea, and poorly controlled reflux.  She had heme + stools. GI had recommended daily Miralax, and daily omeprazole. Diarrhea, abdominal pain and reflux improved (on daily omeprazole). She hadn't started miralax, was having hard pellets.  EGD 07/2023: Impression: - Esophagogastric landmarks identified. - 1 cm hiatal hernia. - Benign appearing vascular bleb in the mid esophagus without any obvious varices otherwise, no high risk stigmata for bleeding. - Normal esophagus otherwise - Non- bleeding gastric ulcers x 2 with no stigmata of bleeding, one with some surrounding nodular mucosa. Biopsied. - Normal stomach otherwise - no gastric varices. Biopsies taken to rule out H pylori - Normal examined duodenum.  Colonoscopy 07/2023: Impression: - One 5 mm polyp  in the ascending colon, removed with a cold snare. Resected and retrieved. - A few colonic angiodysplastic lesions. - One 4 mm polyp at the hepatic flexure, removed with a cold snare. Resected and retrieved. - Six 3 to 6 mm polyps in the transverse colon, removed with a cold snare. Resected and retrieved. - One 5 mm polyp in the  descending colon, removed with a cold snare. Resected and retrieved. - One 3 mm polyp in the sigmoid colon, removed with a cold snare. Resected and retrieved. - Diverticulosis in the sigmoid colon. - Internal hemorrhoids. - The examination was otherwise normal.  FINAL MICROSCOPIC DIAGNOSIS:  A. GASTRIC ULCER, BODY, BIOPSY:  - Gastric antral mucosa with focal nonspecific acute gastritis and  reactive changes  - Negative for dysplasia or malignancy   B. GASTRIC, BIOPSY:  - Gastric antral mucosa with nonspecific reactive gastropathy  - Gastric oxyntic mucosa with no specific histopathologic changes  - Helicobacter pylori-like organisms are not identified on routine HE  stain   C. COLON, ASCENDING, HEPATIC FLEXTURE, POLYPECTOMY:  - Tubular adenoma(s) without high-grade dysplasia or malignancy   D. COLON, TRANSVERSE, DESCENDING, SIGMOID, POLYPECTOMY:  - Tubular adenoma(s) without high-grade dysplasia or malignancy  - Hyperplastic polyp   It was recommended that she take omeprazole 40 mg BID, and f/u in 3-4 months, likely to have repeat endoscopy to ensure ulcers have healed. Advised normal rec for colonoscopy f/u based on multiple polyps would be 3 yr f/u, but based on age of 68 at that time, to discuss further then if needed.    COPD: She remains under the care of Dr. Everardo All, without any recent exacerbations.  She is compliant with daily use of Trelegy. Rarely needs albuterol. Denies cough, wheezing. Exercise is limited.   She continues on oxygen, using only at night (she prefers this to using CPAP).   H/o bilateral pulmonary embolism. Lifelong anticoagulation is recommended. Compliant with Eliquis. She denies bleeding, chest pain, or pain with breathing. PMH, PSH, SH reviewed   ROS: no f/c, URI symptoms, headaches, dizziness, CP.  R rib, R hip and R arm pain from recent fall. Some hand pain from arthritis, mostly in the thumbs R shoulder with limited ROM, mild pain (chronic) Skin  itching from eczema (back, arm, scalp). Breathing is stable. Uses oxygen only at night. Moods are good, some chronic anxiety    PHYSICAL EXAM:  LMP  (LMP Unknown)   Wt Readings from Last 3 Encounters:  08/27/23 221 lb (100.2 kg)  07/20/23 226 lb (102.5 kg)  07/03/23 226 lb 2 oz (102.6 kg)       ASSESSMENT/PLAN:   F/u 3 mos Schedule CPE for 9 mos

## 2023-09-03 ENCOUNTER — Ambulatory Visit (INDEPENDENT_AMBULATORY_CARE_PROVIDER_SITE_OTHER): Payer: Medicare HMO | Admitting: Family Medicine

## 2023-09-03 ENCOUNTER — Encounter: Payer: Self-pay | Admitting: Family Medicine

## 2023-09-03 VITALS — BP 132/72 | HR 72 | Ht 62.0 in | Wt 226.7 lb

## 2023-09-03 DIAGNOSIS — E66813 Obesity, class 3: Secondary | ICD-10-CM | POA: Diagnosis not present

## 2023-09-03 DIAGNOSIS — F411 Generalized anxiety disorder: Secondary | ICD-10-CM | POA: Diagnosis not present

## 2023-09-03 DIAGNOSIS — K449 Diaphragmatic hernia without obstruction or gangrene: Secondary | ICD-10-CM | POA: Diagnosis not present

## 2023-09-03 DIAGNOSIS — K259 Gastric ulcer, unspecified as acute or chronic, without hemorrhage or perforation: Secondary | ICD-10-CM | POA: Diagnosis not present

## 2023-09-03 DIAGNOSIS — J439 Emphysema, unspecified: Secondary | ICD-10-CM

## 2023-09-03 DIAGNOSIS — E1169 Type 2 diabetes mellitus with other specified complication: Secondary | ICD-10-CM | POA: Diagnosis not present

## 2023-09-03 DIAGNOSIS — Z86711 Personal history of pulmonary embolism: Secondary | ICD-10-CM | POA: Diagnosis not present

## 2023-09-03 DIAGNOSIS — Z6841 Body Mass Index (BMI) 40.0 and over, adult: Secondary | ICD-10-CM

## 2023-09-03 DIAGNOSIS — E1159 Type 2 diabetes mellitus with other circulatory complications: Secondary | ICD-10-CM | POA: Diagnosis not present

## 2023-09-03 DIAGNOSIS — K5900 Constipation, unspecified: Secondary | ICD-10-CM

## 2023-09-03 DIAGNOSIS — I152 Hypertension secondary to endocrine disorders: Secondary | ICD-10-CM

## 2023-09-03 DIAGNOSIS — J4489 Other specified chronic obstructive pulmonary disease: Secondary | ICD-10-CM

## 2023-09-03 LAB — POCT GLYCOSYLATED HEMOGLOBIN (HGB A1C): Hemoglobin A1C: 5.2 % — AB (ref 4.0–5.6)

## 2023-09-03 MED ORDER — ESCITALOPRAM OXALATE 10 MG PO TABS: 10.0000 mg | ORAL_TABLET | Freq: Every day | ORAL | 1 refills | Status: DC

## 2023-09-03 NOTE — Patient Instructions (Addendum)
 Continue 1mg  of ozempic--we will try and make sure that you get the 1 mg dose. Continue the metformin 1/day since you have so much at home. We might stop it at your next visit.  Follow-up orthopedist if your hip pain isn't improving.

## 2023-09-04 ENCOUNTER — Telehealth: Payer: Self-pay

## 2023-09-04 NOTE — Progress Notes (Signed)
   09/04/2023  Patient ID: Shannon Schultz, female   DOB: 01-13-1946, 78 y.o.   MRN: [id number]  [facility] and confirmed they have received the 1mg  order and are in the process of shipping. Will notify patient once shipment arrives.  [name], PharmD Clinical Pharmacist [phone]

## 2023-09-07 DIAGNOSIS — M25551 Pain in right hip: Secondary | ICD-10-CM | POA: Diagnosis not present

## 2023-09-10 DIAGNOSIS — M25551 Pain in right hip: Secondary | ICD-10-CM | POA: Diagnosis not present

## 2023-09-10 NOTE — Telephone Encounter (Signed)
 PAP Ozempic received,  called pt and informed

## 2023-09-14 ENCOUNTER — Other Ambulatory Visit: Payer: Self-pay | Admitting: Physician Assistant

## 2023-09-15 ENCOUNTER — Other Ambulatory Visit (HOSPITAL_COMMUNITY): Payer: Self-pay | Admitting: *Deleted

## 2023-09-15 DIAGNOSIS — Z952 Presence of prosthetic heart valve: Secondary | ICD-10-CM

## 2023-09-21 DIAGNOSIS — M1611 Unilateral primary osteoarthritis, right hip: Secondary | ICD-10-CM | POA: Diagnosis not present

## 2023-09-24 ENCOUNTER — Inpatient Hospital Stay: Payer: Medicare HMO | Attending: Hematology and Oncology

## 2023-09-24 ENCOUNTER — Other Ambulatory Visit: Payer: Self-pay | Admitting: Hematology and Oncology

## 2023-09-24 ENCOUNTER — Inpatient Hospital Stay: Payer: Medicare HMO | Admitting: Hematology and Oncology

## 2023-09-24 VITALS — BP 141/51 | HR 75 | Temp 97.4°F | Resp 15 | Wt 218.4 lb

## 2023-09-24 DIAGNOSIS — D696 Thrombocytopenia, unspecified: Secondary | ICD-10-CM | POA: Insufficient documentation

## 2023-09-24 DIAGNOSIS — Z86711 Personal history of pulmonary embolism: Secondary | ICD-10-CM

## 2023-09-24 DIAGNOSIS — Z853 Personal history of malignant neoplasm of breast: Secondary | ICD-10-CM | POA: Insufficient documentation

## 2023-09-24 DIAGNOSIS — Z79899 Other long term (current) drug therapy: Secondary | ICD-10-CM | POA: Insufficient documentation

## 2023-09-24 DIAGNOSIS — Z7901 Long term (current) use of anticoagulants: Secondary | ICD-10-CM | POA: Insufficient documentation

## 2023-09-24 DIAGNOSIS — I2699 Other pulmonary embolism without acute cor pulmonale: Secondary | ICD-10-CM

## 2023-09-24 DIAGNOSIS — Z8551 Personal history of malignant neoplasm of bladder: Secondary | ICD-10-CM | POA: Diagnosis not present

## 2023-09-24 DIAGNOSIS — Z87891 Personal history of nicotine dependence: Secondary | ICD-10-CM | POA: Insufficient documentation

## 2023-09-24 DIAGNOSIS — D72819 Decreased white blood cell count, unspecified: Secondary | ICD-10-CM | POA: Diagnosis not present

## 2023-09-24 DIAGNOSIS — Z803 Family history of malignant neoplasm of breast: Secondary | ICD-10-CM | POA: Insufficient documentation

## 2023-09-24 LAB — CBC WITH DIFFERENTIAL (CANCER CENTER ONLY)
Abs Immature Granulocytes: 0.01 10*3/uL (ref 0.00–0.07)
Basophils Absolute: 0.1 10*3/uL (ref 0.0–0.1)
Basophils Relative: 1 %
Eosinophils Absolute: 0.1 10*3/uL (ref 0.0–0.5)
Eosinophils Relative: 2 %
HCT: 41.2 % (ref 36.0–46.0)
Hemoglobin: 13.1 g/dL (ref 12.0–15.0)
Immature Granulocytes: 0 %
Lymphocytes Relative: 15 %
Lymphs Abs: 0.8 10*3/uL (ref 0.7–4.0)
MCH: 29.4 pg (ref 26.0–34.0)
MCHC: 31.8 g/dL (ref 30.0–36.0)
MCV: 92.4 fL (ref 80.0–100.0)
Monocytes Absolute: 0.3 10*3/uL (ref 0.1–1.0)
Monocytes Relative: 6 %
Neutro Abs: 4 10*3/uL (ref 1.7–7.7)
Neutrophils Relative %: 76 %
Platelet Count: 107 10*3/uL — ABNORMAL LOW (ref 150–400)
RBC: 4.46 MIL/uL (ref 3.87–5.11)
RDW: 15.4 % (ref 11.5–15.5)
WBC Count: 5.2 10*3/uL (ref 4.0–10.5)
nRBC: 0 % (ref 0.0–0.2)

## 2023-09-24 LAB — CMP (CANCER CENTER ONLY)
ALT: 22 U/L (ref 0–44)
AST: 22 U/L (ref 15–41)
Albumin: 3.8 g/dL (ref 3.5–5.0)
Alkaline Phosphatase: 92 U/L (ref 38–126)
Anion gap: 3 — ABNORMAL LOW (ref 5–15)
BUN: 19 mg/dL (ref 8–23)
CO2: 31 mmol/L (ref 22–32)
Calcium: 9.1 mg/dL (ref 8.9–10.3)
Chloride: 106 mmol/L (ref 98–111)
Creatinine: 0.62 mg/dL (ref 0.44–1.00)
GFR, Estimated: >60 mL/min (ref >60)
Glucose, Bld: 113 mg/dL — ABNORMAL HIGH (ref 70–99)
Potassium: 3.9 mmol/L (ref 3.5–5.1)
Sodium: 140 mmol/L (ref 135–145)
Total Bilirubin: 0.9 mg/dL (ref 0.0–1.2)
Total Protein: 6.4 g/dL — ABNORMAL LOW (ref 6.5–8.1)

## 2023-09-24 NOTE — Progress Notes (Signed)
 [facility] [phone]   [phone]  PROGRESS NOTE  Patient Care Team: Joselyn Arrow, MD as PCP - General (Family Medicine) Croitoru, Rachelle Hora, MD as PCP - Cardiology (Cardiology) Joselyn Arrow, MD as Referring Physician (Family Medicine) Verner Chol, [facility] (Inactive) as Pharmacist (Pharmacist) Raymondo Band as Physician Assistant (Hematology and Oncology) Charna Elizabeth, MD as Consulting Physician (Gastroenterology) Griselda Miner, MD as Consulting Physician (General Surgery) Heloise Purpura, MD as Consulting Physician (Urology) Marcene Corning, MD as Consulting Physician (Orthopedic Surgery) Luciano Cutter, MD as Consulting Physician (Pulmonary Disease) Samson Frederic, MD as Consulting Physician (Orthopedic Surgery) Yolonda Kida, MD as Consulting Physician (Orthopedic Surgery)  Hematological/Oncological History # Bilateral Pulmonary Emboli  1) 09/13/2021: Presented to pulmonology team due to cough with hemoptysis.  CTA chest revealed filling defects within bilateral pulmonary arteries that concerning for pulmonary emboli.  This includes a segmental right lower lobe, middle lobe and lingular pulmonary arteries as well as possibly within the left lower lobe and bilateral upper lobe segmental pulmonary arteries.  Patient was advised to go to the emergency room and she was started on heparin infusion.  Bilateral Doppler ultrasound of the lower extremities was negative for DVT.  She was discharged on Eliquis starter pack.  2)  10/14/2021: Repeat CTA chest: Resolved emboli in the segmental arteries.  No evidence of acute pulmonary embolism  3) 10/16/2021: Establish care with [facility]  4) 03/19/2022: Recommend to transition to maintenance dose of Eliquis 2.5 mg twice daily  HISTORY OF PRESENTING ILLNESS:  Shannon Schultz 78 y.o. female returns for a follow-up for history of bilateral pulmonary emboli.  She was last seen on 03/19/2022.  In  the interim, she denies any changes to her health.  On exam today, Ms. Rauh reports she did have a fall recently which she did not go to the emergency department.  She reports she stumbled over a step and injured her arm and her hip.  She reports she has not seen her orthopedic surgeon because it was the hip that had previously been placed.  She reports to have some bruising as a heavy bleeding where her arm was scraped.  Likely with some Steri-Strips she was able to control the bleeding and had no trouble with healing.  She reports otherwise she has been in good health with no new medications, hospitalizations, or ER visits.  She reports that she has had no bleeding elsewhere such as nosebleeds, gum bleeding, or dark stools.  She reports that and had no trouble with healing.  She reports otherwise she has been in good health with no new medications, hospitalizations, or ER visits.  She reports that she has had no bleeding elsewhere such as nosebleeds, gum bleeding, or dark stools.  She reports that medication is now much more reasonable cost, now $131 for 96-month supply.  She is no signs or symptoms concerning for recurrent VTE such as leg pain, leg swelling, chest pain, or shortness of breath.  She reports that she has been losing weight intentionally with diet, exercise and Ozempic.Marland Kitchen She has no other complaints. Rest of the 10 point ROS is below.   MEDICAL HISTORY:  Past Medical History:  Diagnosis Date   Anxiety    Arthritis    "hands" (05/15/2017)   Bladder cancer (HCC) 2018   Breast cancer, right (HCC) 1992   DCIS,bladder ca (just dx)   Colon polyp    Complication of anesthesia 1992   "local anesthesia" used  was hard to awaken from-no problems since (05/15/2017)   COPD (chronic obstructive pulmonary disease) (HCC)    Coronary artery disease    COVID-19 virus infection 05/05/2019   Diverticulosis    Elevated liver enzymes    fatty liver per ultrasound per pt   FHx: BRCA2 gene positive     sister with BRCA2 mutation (pt tested NEGATIVE)   Fibromyalgia    Heart murmur    faintly heard since TAVR 08-14-2020   HLD (hyperlipidemia)    HSV (herpes simplex virus) infection    on hip--on daily suppression   Hypertension    Hypothyroidism    took med 7 yrs after birth of 1st child   Impaired glucose tolerance    Migraine    Nonalcoholic steatohepatitis (NASH) 03/23/2022   On home oxygen therapy    "have it available but I'm not using it" (05/15/2017)   Osteopenia    Personal history of breast cancer 11/15/2021   S/P TAVR (transcatheter aortic valve replacement) 08/14/2020   s/p TAVR with a 26 mm Edwards S3U via the left subclavian approach by Dr. Cornelius Moras and Dr. Clifton James   Severe aortic stenosis    Severe obesity (BMI >= 40) (HCC) 10/06/2014   Sleep apnea    Sleeps with 2 L O2 @ night   Type 2 diabetes mellitus (HCC)    Urothelial cancer (HCC) 03/10/2017   Incidental bladder mass noted on CT, papillary urothelial CA on pathology from excision 12/2016; post-op instillation of chemo. Due for f/u cystoscopy 03/2017   Venous insufficiency 09/10/2018   Ventral hernia    Ventral hernia without obstruction or gangrene 05/15/2017   Vitamin D deficiency disease     SURGICAL HISTORY: Past Surgical History:  Procedure Laterality Date   ABDOMINAL HERNIA REPAIR  05/15/2017   BIOPSY  07/20/2023   Procedure: BIOPSY;  Surgeon: Benancio Deeds, MD;  Location: [facility];  Service: Gastroenterology;;   BREAST BIOPSY Right 1992   BREAST BIOPSY Left 2018   BREAST EXCISIONAL BIOPSY Left 2018   ATYPICAL DUCTAL HYPERPLASIA INVOLVING A COMPLEX   BREAST LUMPECTOMY WITH RADIOACTIVE SEED LOCALIZATION Left 12/22/2016   Procedure: LEFT BREAST LUMPECTOMY WITH RADIOACTIVE SEED LOCALIZATION;  Surgeon: Griselda Miner, MD;  Location: [facility];  Service: General;  Laterality: Left;   CARDIAC CATHETERIZATION     CATARACT EXTRACTION, BILATERAL Bilateral R 03/16/2019 L 03/30/2019   Dr. Alben Spittle    COLONOSCOPY  2009, 08/2010, 12/2015   Dr. Loreta Ave; "only 1 had any polyps" (05/15/2017)   COLONOSCOPY WITH PROPOFOL N/A 07/20/2023   Procedure: COLONOSCOPY WITH PROPOFOL;  Surgeon: Benancio Deeds, MD;  Location: [facility];  Service: Gastroenterology;  Laterality: N/A;   CYSTOSCOPY  04/23/2017   CYSTOSCOPY W/ RETROGRADES Bilateral 01/12/2017   Procedure: CYSTOSCOPY WITH RETROGRADE PYELOGRAM/ EXAM UNDER ANESTHESIA;  Surgeon: Heloise Purpura, MD;  Location: [facility];  Service: Urology;  Laterality: Bilateral;   ENDOVENOUS ABLATION SAPHENOUS VEIN W/ LASER Right 01/09/2022   endovenous laser ablation right greater saphenous vein by Lemar Livings MD   ESOPHAGOGASTRODUODENOSCOPY (EGD) WITH PROPOFOL N/A 07/20/2023   Procedure: ESOPHAGOGASTRODUODENOSCOPY (EGD) WITH PROPOFOL;  Surgeon: Benancio Deeds, MD;  Location: [facility];  Service: Gastroenterology;  Laterality: N/A;   EYE SURGERY Bilateral    Cataracts removed   FEMUR IM NAIL Right 08/24/2019   Procedure: INTRAMEDULLARY (IM) NAIL FEMORAL;  Surgeon: Samson Frederic, MD;  Location: [facility];  Service: Orthopedics;  Laterality: Right;   HERNIA REPAIR     INSERTION OF MESH  N/A 05/15/2017   Procedure: INSERTION OF MESH;  Surgeon: Griselda Miner, MD;  Location: [facility] OR;  Service: General;  Laterality: N/A;   LAPAROSCOPIC CHOLECYSTECTOMY  2003   MASTECTOMY Right 1992   POLYPECTOMY  07/20/2023   Procedure: POLYPECTOMY;  Surgeon: Benancio Deeds, MD;  Location: [facility];  Service: Gastroenterology;;   RIGHT/LEFT HEART CATH AND CORONARY ANGIOGRAPHY N/A 07/05/2020   Procedure: RIGHT/LEFT HEART CATH AND CORONARY ANGIOGRAPHY;  Surgeon: Kathleene Hazel, MD;  Location: [facility];  Service: Cardiovascular;  Laterality: N/A;   TEE WITHOUT CARDIOVERSION N/A 08/14/2020   Procedure: TRANSESOPHAGEAL ECHOCARDIOGRAM (TEE);  Surgeon: Kathleene Hazel, MD;  Location: [facility] OR;  Service: Open Heart Surgery;  Laterality: N/A;    TRANSURETHRAL RESECTION OF BLADDER TUMOR WITH MITOMYCIN-C N/A 01/12/2017   Procedure: TRANSURETHRAL RESECTION OF BLADDER TUMOR WITH POSSIBLE POST OPERATIVE INSTILLATION OF MITOMYCIN-C;  Surgeon: Heloise Purpura, MD;  Location: [facility];  Service: Urology;  Laterality: N/A;   TYMPANOSTOMY TUBE PLACEMENT Bilateral 1980s   UMBILICAL HERNIA REPAIR  2003   VENTRAL HERNIA REPAIR N/A 05/15/2017   Procedure: VENTRAL HERNIA REPAIR WITH MESH;  Surgeon: Griselda Miner, MD;  Location: [facility] OR;  Service: General;  Laterality: N/A;    SOCIAL HISTORY: Social History   Socioeconomic History   Marital status: Married    Spouse name: Not on file   Number of children: 3   Years of education: Not on file   Highest education level: Not on file  Occupational History   Occupation: Retired  Tobacco Use   Smoking status: Former    Current packs/day: 0.00    Average packs/day: 1 pack/day for 46.0 years (46.0 ttl pk-yrs)    Types: Cigarettes    Start date: 05/30/1965    Quit date: 05/31/2011    Years since quitting: 12.3   Smokeless tobacco: Never  Vaping Use   Vaping status: Never Used  Substance and Sexual Activity   Alcohol use: No   Drug use: No   Sexual activity: Yes    Partners: Male    Birth control/protection: Post-menopausal  Other Topics Concern   Not on file  Social History Narrative   Lives with her husband.  2 sons live in [place] nearby, 1 son moved to [place], [place] in 05/2023.   No pets. 6 grandchildren.         Updated 05/2023   Social Drivers of Health   Financial Resource Strain: Low Risk  (11/20/2022)   Overall Financial Resource Strain (CARDIA)    Difficulty of Paying Living Expenses: Not very hard  Food Insecurity: No Food Insecurity (11/20/2022)   Hunger Vital Sign    Worried About Running Out of Food in the Last Year: Never true    Ran Out of Food in the Last Year: Never true  Transportation Needs: No Transportation Needs (11/20/2022)   PRAPARE - Scientist, research (physical sciences) (Medical): No    Lack of Transportation (Non-Medical): No  Physical Activity: Not on file  Stress: No Stress Concern Present (11/20/2022)   [employer] of Occupational Health - Occupational Stress Questionnaire    Feeling of Stress : Only a little  Social Connections: Socially Integrated (11/20/2022)   Social Connection and Isolation Panel [NHANES]    Frequency of Communication with Friends and Family: Three times a week    Frequency of Social Gatherings with Friends and Family: Once a week    Attends Religious Services: More than 4 times per  year    Active Member of Clubs or Organizations: No    Attends Banker Meetings: More than 4 times per year    Marital Status: Married  Catering manager Violence: Unknown (11/20/2022)   Humiliation, Afraid, Rape, and Kick questionnaire    Fear of Current or Ex-Partner: No    Emotionally Abused: No    Physically Abused: Not on file    Sexually Abused: Not on file    FAMILY HISTORY: Family History  Problem Relation Age of Onset   Heart disease Mother        tachycardia   Heart disease Father    Diabetes Father    Hypertension Father    Asthma Sister    Allergies Sister    Hyperlipidemia Sister    Hashimoto's thyroiditis Sister    Fibromyalgia Sister    Skin cancer Sister        leg; dx after 30; surgery only   Arthritis Sister    Hypertension Sister    Kidney disease Sister    Varicose Veins Sister    Asthma Sister    Allergies Sister    Hyperlipidemia Sister    Hashimoto's thyroiditis Sister    Fibromyalgia Sister    Arthritis Sister    Breast cancer Sister 1       BRCA2 positive; metastatic in late 17s   Arthritis Brother    Cancer Maternal Grandmother        deceased 45; unk. primary; possibly stomach   Tuberculosis Maternal Grandfather    Stroke Paternal Grandmother 40       died of cerebral hemorrhage   Cancer Other        unknown GYN cancers; MGM's sisters   Other Niece        BRCA2  positive; prophylatic mastectomy   ADD / ADHD Son    Depression Son     ALLERGIES:  is allergic to contrast media [iodinated contrast media], iohexol, lisinopril, sulfa antibiotics, latex, codeine, levofloxacin, lipitor [atorvastatin calcium], meloxicam, nickel, and vicodin [hydrocodone-acetaminophen].  MEDICATIONS:  Current Outpatient Medications  Medication Sig Dispense Refill   ACCU-CHEK AVIVA PLUS test strip TEST BLOOD SUGAR EVERY DAY 100 strip 3   acetaminophen (TYLENOL) 500 MG tablet Take 500-1,000 mg by mouth every 6 (six) hours as needed for moderate pain (pain score 4-6) or headache.     albuterol (PROVENTIL) (2.5 MG/3ML) 0.083% nebulizer solution Take 3 mLs (2.5 mg total) by nebulization every 6 (six) hours as needed for wheezing or shortness of breath. (Patient not taking: Reported on 08/27/2023) 75 mL 12   albuterol (VENTOLIN HFA) 108 (90 Base) MCG/ACT inhaler Inhale 2 puffs into the lungs every 6 (six) hours as needed for wheezing or shortness of breath. (Patient not taking: Reported on 08/27/2023) 3 each 3   Alcohol Swabs (B-D SINGLE USE SWABS REGULAR) PADS Use twice a day when checking blood sugars 100 each 1   amoxicillin (AMOXIL) 500 MG capsule Take 2,000 mg by mouth See admin instructions. Take 2000 mg 1 hour prior to dental work (Patient not taking: Reported on 08/27/2023)     apixaban (ELIQUIS) 2.5 MG TABS tablet Take 1 tablet (2.5 mg total) by mouth 2 (two) times daily. 180 tablet 1   bisoprolol-hydrochlorothiazide (ZIAC) 10-6.25 MG tablet TAKE 1 TABLET EVERY DAY 90 tablet 0   cetirizine (ZYRTEC) 10 MG tablet Take 10 mg by mouth daily.     clobetasol cream (TEMOVATE) 0.05 % Apply 1 Application topically 2 (two) times daily  as needed (eczema).     escitalopram (LEXAPRO) 10 MG tablet Take 1 tablet (10 mg total) by mouth daily. 90 tablet 1   guaiFENesin (MUCINEX) 600 MG 12 hr tablet Take 600 mg by mouth at bedtime.     hydrocortisone cream 1 % Apply 1 Application topically 2  (two) times daily as needed for itching. (Patient not taking: Reported on 09/03/2023)     metFORMIN (GLUCOPHAGE-XR) 500 MG 24 hr tablet TAKE 1 TABLET EVERY DAY WITH BREAKFAST 90 tablet 0   montelukast (SINGULAIR) 10 MG tablet Take 1 tablet (10 mg total) by mouth at bedtime. 90 tablet 3   Multiple Vitamins-Minerals (PRESERVISION AREDS 2 PO) Take 1 each by mouth 2 (two) times daily.     omeprazole (PRILOSEC) 40 MG capsule TAKE 1 CAPSULE TWICE DAILY 180 capsule 3   Semaglutide, 1 MG/DOSE, (OZEMPIC, 1 MG/DOSE,) 4 MG/3ML SOPN Inject 1 mg into the skin every Friday.     simvastatin (ZOCOR) 20 MG tablet Take 1 tablet (20 mg total) by mouth at bedtime. 90 tablet 1   TRELEGY ELLIPTA 100-62.5-25 MCG/ACT AEPB INHALE 1 PUFF INTO THE LUNGS DAILY. 180 each 3   triamcinolone cream (KENALOG) 0.1 % Apply 1 Application topically 2 (two) times daily as needed (eczema/dry skin). (Patient not taking: Reported on 08/27/2023)     TRUEplus Lancets 30G MISC TEST BLOOD SUGAR EVERY DAY 100 each 3   valACYclovir (VALTREX) 500 MG tablet TAKE 1 TABLET EVERY DAY 90 tablet 0   No current facility-administered medications for this visit.    REVIEW OF SYSTEMS:   Constitutional: ( - ) fevers, ( - )  chills , ( - ) night sweats Eyes: ( - ) blurriness of vision, ( - ) double vision, ( - ) watery eyes Ears, nose, mouth, throat, and face: ( - ) mucositis, ( - ) sore throat Respiratory: ( - ) cough, ( - ) dyspnea, ( - ) wheezes Cardiovascular: ( - ) palpitation, ( - ) chest discomfort, (+ ) lower extremity swelling Gastrointestinal:  ( - ) nausea, ( - ) heartburn, ( - ) change in bowel habits Skin: ( - ) abnormal skin rashes Lymphatics: ( - ) new lymphadenopathy, ( - ) easy bruising Neurological: ( - ) numbness, ( - ) tingling, ( - ) new weaknesses Behavioral/Psych: ( - ) mood change, ( - ) new changes  All other systems were reviewed with the patient and are negative.  PHYSICAL EXAMINATION: ECOG PERFORMANCE STATUS: 0 -  Asymptomatic  There were no vitals filed for this visit.  There were no vitals filed for this visit.   GENERAL: well appearing female in NAD  SKIN: skin color, texture, turgor are normal, no rashes or significant lesions EYES: conjunctiva are pink and non-injected, sclera clear LUNGS: clear to auscultation and percussion with normal breathing effort HEART: regular rate & rhythm and no murmurs. Bilateral venous stasis erythema with edema.  Musculoskeletal: no cyanosis of digits and no clubbing  PSYCH: alert & oriented x 3, fluent speech NEURO: no focal motor/sensory deficits  LABORATORY DATA:  I have reviewed the data as listed    Latest Ref Rng & Units 04/28/2023    3:44 PM 03/27/2023   11:12 AM 09/25/2022   10:49 AM  CBC  WBC 4.0 - 10.5 K/uL 5.6  5.2  4.6   Hemoglobin 12.0 - 15.0 g/dL 16.1  09.6  04.5   Hematocrit 36.0 - 46.0 % 43.2  41.9  40.3   Platelets  150.0 - 400.0 K/uL 131.0  122  122        Latest Ref Rng & Units 04/28/2023    3:44 PM 03/27/2023   11:12 AM 11/20/2022   12:15 PM  CMP  Glucose 70 - 99 mg/dL 161  096  045   BUN 6 - 23 mg/dL 16  13    Creatinine 4.09 - 1.20 mg/dL 8.11  9.14    Sodium 782 - 145 mEq/L 138  139    Potassium 3.5 - 5.1 mEq/L 3.7  4.0    Chloride 96 - 112 mEq/L 104  106    CO2 19 - 32 mEq/L 28  28    Calcium 8.4 - 10.5 mg/dL 9.1  9.1    Total Protein 6.0 - 8.3 g/dL 6.8  6.9    Total Bilirubin 0.2 - 1.2 mg/dL 1.1  1.2    Alkaline Phos 39 - 117 U/L 61  62    AST 0 - 37 U/L 17  21    ALT 0 - 35 U/L 12  12      RADIOGRAPHIC STUDIES: I have personally reviewed the radiological images as listed and agreed with the findings in the report. DG Ribs Unilateral W/Chest Right Result Date: 08/27/2023 CLINICAL DATA:  Fall yesterday.  Right rib pain. EXAM: RIGHT RIBS AND CHEST - 3+ VIEW COMPARISON:  AP chest 03/25/2022, CT chest 03/16/2023, chest two views 09/13/2021 FINDINGS: Cardiac silhouette and mediastinal contours are unchanged and within  normal limits. Aortic valve prosthesis again noted.moderate atherosclerotic calcifications within the aortic arch. There is mild chronic bilateral interstitial thickening. Increased lucencies within the upper lungs consistent with the centrilobular emphysematous changes seen on prior CT. There is chronic blunting of the bilateral costophrenic angles, appearing similar to 09/13/2021. No definite acute pleural effusion is seen. No pneumothorax. Mild dextrocurvature of the mid to lower thoracic spine. Surgical clips again overlie the right chest wall status post mastectomy. No acute displaced right-sided rib fracture is seen. IMPRESSION: 1. No acute displaced right-sided rib fracture is seen. No pneumothorax. 2. Chronic blunting of the bilateral costophrenic angles, appearing similar to 09/13/2021. This may represent scarring. No definite acute pleural effusion is seen. 3. Mild chronic bilateral interstitial thickening. Electronically Signed   By: Neita Garnet M.D.   On: 08/27/2023 13:48   DG HIP UNILAT W OR W/O PELVIS 2-3 VIEWS RIGHT Result Date: 08/27/2023 CLINICAL DATA:  Fall 08/26/2023.  Right hip pain. EXAM: DG HIP (WITH OR WITHOUT PELVIS) 2-3V RIGHT COMPARISON:  CT abdomen and pelvis 04/29/2023 FINDINGS: There is diffuse decreased bone mineralization. Prior cephalomedullary nail fixation of the proximal right femur. Associated cortical irregularity of the superior aspect of the proximal right greater trochanter is unchanged from prior CT. No definitive new proximal femoral fracture, within the limitations of diffuse decreased bone mineralization. Mild bilateral sacroiliac subchondral sclerosis. Mild-to-moderate bilateral superomedial femoroacetabular joint space narrowing. Mild pubic symphysis. Dense atherosclerotic calcifications. IMPRESSION: Prior cephalomedullary nail fixation of remote fracture of the proximal right femur. No definitive new proximal femoral fracture, within the limitations of diffuse  decreased bone mineralization. Electronically Signed   By: Neita Garnet M.D.   On: 08/27/2023 13:43    ASSESSMENT & PLAN JAZZ BIDDY is a 78 y.o. female who presents for a follow up for history of bilateral pulmonary emboli diagnosed in March 2023.  #Unprovoked Bilateral Pulmonary Emboli --findings at this time are consistent with a unprovoked VTE --No evidence of antiphospholipid syndrome based on testing from 10/16/2021 --  Currently on eliquis 2.5 mg twice daily. Will continue indefinitely.  --patient denies any bleeding, bruising, or dark stools on this medication. It is well tolerated.  --The cost of the medication is now $131 for 10-month supply -- labs today show white blood cell 5.2, hemoglobin 13.1, MCV 92.4, platelets 107.  Additionally creatinine and liver function are within normal limits. --RTC in 6 months strict return precautions for overt signs of bleeding.   #Personal history of breast cancer and bladder cancer: --Patient underwent genetic testing in August 2015 for personal history and family history of breast cancer.  --currently in survivorship  #Leukopenia/Thrombocytopenia:  --Likely secondary to hepatic cirrhosis and splenomegaly seen on CT imaging from 10/14/2021 --Monitor for now.   No orders of the defined types were placed in this encounter.   All questions were answered. The patient knows to call the clinic with any problems, questions or concerns.  I have spent a total of 25 minutes minutes of face-to-face and non-face-to-face time, preparing to see the patient,  performing a medically appropriate examination, counseling and educating the patient, documenting clinical information in the electronic health record, and care coordination.   Ulysees Barns, MD Department of Hematology/Oncology [facility] at [facility] Phone: [phone] Pager: [phone] Email: [email or web address]

## 2023-09-30 DIAGNOSIS — S76311D Strain of muscle, fascia and tendon of the posterior muscle group at thigh level, right thigh, subsequent encounter: Secondary | ICD-10-CM | POA: Diagnosis not present

## 2023-10-02 DIAGNOSIS — S76311D Strain of muscle, fascia and tendon of the posterior muscle group at thigh level, right thigh, subsequent encounter: Secondary | ICD-10-CM | POA: Diagnosis not present

## 2023-10-07 DIAGNOSIS — M25551 Pain in right hip: Secondary | ICD-10-CM | POA: Diagnosis not present

## 2023-10-09 DIAGNOSIS — S76311D Strain of muscle, fascia and tendon of the posterior muscle group at thigh level, right thigh, subsequent encounter: Secondary | ICD-10-CM | POA: Diagnosis not present

## 2023-10-14 DIAGNOSIS — M25551 Pain in right hip: Secondary | ICD-10-CM | POA: Diagnosis not present

## 2023-10-16 DIAGNOSIS — M25551 Pain in right hip: Secondary | ICD-10-CM | POA: Diagnosis not present

## 2023-10-19 ENCOUNTER — Ambulatory Visit (HOSPITAL_BASED_OUTPATIENT_CLINIC_OR_DEPARTMENT_OTHER): Payer: Medicare HMO | Admitting: Pulmonary Disease

## 2023-10-19 ENCOUNTER — Encounter (HOSPITAL_BASED_OUTPATIENT_CLINIC_OR_DEPARTMENT_OTHER): Payer: Self-pay

## 2023-10-21 DIAGNOSIS — S76311D Strain of muscle, fascia and tendon of the posterior muscle group at thigh level, right thigh, subsequent encounter: Secondary | ICD-10-CM | POA: Diagnosis not present

## 2023-10-22 ENCOUNTER — Other Ambulatory Visit

## 2023-10-22 ENCOUNTER — Ambulatory Visit (INDEPENDENT_AMBULATORY_CARE_PROVIDER_SITE_OTHER): Payer: Medicare HMO | Admitting: Gastroenterology

## 2023-10-22 ENCOUNTER — Encounter: Payer: Self-pay | Admitting: Gastroenterology

## 2023-10-22 VITALS — BP 112/58 | HR 68 | Ht 62.0 in | Wt 218.0 lb

## 2023-10-22 DIAGNOSIS — K7460 Unspecified cirrhosis of liver: Secondary | ICD-10-CM

## 2023-10-22 DIAGNOSIS — Z79899 Other long term (current) drug therapy: Secondary | ICD-10-CM | POA: Diagnosis not present

## 2023-10-22 DIAGNOSIS — K259 Gastric ulcer, unspecified as acute or chronic, without hemorrhage or perforation: Secondary | ICD-10-CM | POA: Diagnosis not present

## 2023-10-22 NOTE — Progress Notes (Signed)
 HPI :  78 y/o female here for reassessment - history of cirrhosis, gastric ulcer. Also s/p TAVR 2022, COPD on oxygen  2 L at night, Eliquis  for PE dx 2023, history of bladder CA 2018 and R breast cancer 1992.   Cirrhosis history -diagnosed with this years ago, unclear chronicity.  Reportedly due to [facility].  I do not see any prior lab work done to rule out other chronic liver diseases historically other than a remote alpha one antitrypsin level which was negative.  Followed by Dr. Tova Fresh for years and transition to our office recently.  She has never had jaundice, variceal bleeding, ascites, or encephalopathy that we are aware of.  I recently performed an EGD at the hospital this past January.  She did not have any obvious varices on that exam (suspected proximal vascular bleb).  She did incidentally have a few gastric ulcers that were noted.  She denies NSAID use.  Was on omeprazole  once daily, we increased that to twice daily dosing which she is tolerating well.  She is never had any bleeding from this.  She has never had anemia from this.  Biopsies of the ulcer showed no precancerous changes.  Test negative for H. pylori.  She denies any postprandial symptoms.  She has rare reflux but typically is only dietary induced.  She did not have any Barrett's on EGD.  She reports stable cardiopulmonary status, on stable amounts of oxygen , states COPD is well-controlled and she has not had any flares of that.  She has tolerated anesthesia well in the past.  We discussed that she wanted to pursue a follow-up endoscopy or not.  Her grandmother had gastric cancer, she is worried about that.  We discussed in light of her medical problems if she wanted to proceed or not with another endoscopy    HCC screening 04/29/2023 CT abdomen pelvis with contrast for abdominal pain and HCC screening showed diverticulosis without acute diverticulitis, cirrhosis with portal venous hypertension by splenomegaly and upper abdominal  varices aortic atherosclerosis no liver lesions.  She  reports that she quit smoking about 12 years ago. Her smoking use included cigarettes. She started smoking about 58 years ago. She has a 46 pack-year smoking history. She has never used smokeless tobacco. She reports that she does not drink alcohol  and does not use drugs.     CT abdomen / pelvis 04/29/23: IMPRESSION: 1. Distal colonic diverticulosis with no evidence of acute diverticulitis. 2. Cirrhosis, with portal venous hypertension manifested by splenomegaly and upper abdominal varices. 3.  Aortic Atherosclerosis (ICD10-I70.0).    EGD 07/20/23: Esophagogastric landmarks were identified: the Z-line was found at 40 cm, the gastroesophageal junction was found at 40 cm and the upper extent of the gastric folds was found at 41 cm from the incisors. Findings: A 1 cm hiatal hernia was present. There was a suspected isolated vascular bleb in the mid esophagus, there did not appear to be any varices associated with this. No stigmata for bleeding noted. The exam of the esophagus was otherwise normal. Two non-bleeding superficial gastric ulcers with no stigmata of bleeding were found in the gastric fundus (5mm) and in the gastric body (3mm). The largest lesion was 5 mm in largest dimension. There was some nodular mucosa around the smaller ulcer. Biopsies were taken from this area with a cold forceps for histology. The exam of the stomach was otherwise normal. No gastric varices. Biopsies were taken with a cold forceps for Helicobacter pylori testing. The examined duodenum  was normal.  Colonoscopy 07/20/23: - One 5 mm polyp in the ascending colon, removed with a cold snare. Resected and retrieved. - A few colonic angiodysplastic lesions. - One 4 mm polyp at the hepatic flexure, removed with a cold snare. Resected and retrieved. - Six 3 to 6 mm polyps in the transverse colon, removed with a cold snare. Resected and retrieved. - One 5 mm polyp in the  descending colon, removed with a cold snare. Resected and retrieved. - One 3 mm polyp in the sigmoid colon, removed with a cold snare. Resected and retrieved. - Diverticulosis in the sigmoid colon. - Internal hemorrhoids. - The examination was otherwise normal.   FINAL MICROSCOPIC DIAGNOSIS:   A. GASTRIC ULCER, BODY, BIOPSY:  - Gastric antral mucosa with focal nonspecific acute gastritis and  reactive changes  - Negative for dysplasia or malignancy   B. GASTRIC, BIOPSY:  - Gastric antral mucosa with nonspecific reactive gastropathy  - Gastric oxyntic mucosa with no specific histopathologic changes  - Helicobacter pylori-like organisms are not identified on routine HE  stain   C. COLON, ASCENDING, HEPATIC FLEXTURE, POLYPECTOMY:  - Tubular adenoma(s) without high-grade dysplasia or malignancy   D. COLON, TRANSVERSE, DESCENDING, SIGMOID, POLYPECTOMY:  - Tubular adenoma(s) without high-grade dysplasia or malignancy  - Hyperplastic polyp      Echo 08/12/23: IMPRESSIONS     1. Left ventricular ejection fraction, by estimation, is 55 to 60%. The  left ventricle has normal function. The left ventricle has no regional  wall motion abnormalities. There is mild left ventricular hypertrophy.  Left ventricular diastolic parameters  are consistent with Grade I diastolic dysfunction (impaired relaxation).  The average left ventricular global longitudinal strain is -16.4 %. The  global longitudinal strain is indeterminate.   2. Right ventricular systolic function is normal. The right ventricular  size is normal. There is normal pulmonary artery systolic pressure. The  estimated right ventricular systolic pressure is 23.2 mmHg.   3. Mild mitral valve regurgitation.   4. Possible PPM. AV max 2.65 m/s. DI 0.29. EOA index 0.41. AT <157ms.  Aortic valve regurgitation is not visualized. There is a 26 mm Sapien  prosthetic (TAVR) valve present in the aortic position. Procedure Date:  07/2020.  Aortic valve area, by VTI  measures 0.82 cm. Aortic valve mean gradient measures 17.0 mmHg. Aortic  valve Vmax measures 2.65 m/s.   5. The inferior vena cava is normal in size with greater than 50%  respiratory variability, suggesting right atrial pressure of 3 mmHg.    Past Medical History:  Diagnosis Date   Anxiety    Arthritis    "hands" (05/15/2017)   Bladder cancer (HCC) 2018   Breast cancer, right (HCC) 1992   DCIS,bladder ca (just dx)   Cirrhosis (HCC)    Colon polyp    Complication of anesthesia 1992   "local anesthesia" used was hard to awaken from-no problems since (05/15/2017)   COPD (chronic obstructive pulmonary disease) (HCC)    Coronary artery disease    COVID-19 virus infection 05/05/2019   Diverticulosis    Elevated liver enzymes    fatty liver per ultrasound per pt   FHx: BRCA2 gene positive    sister with BRCA2 mutation (pt tested NEGATIVE)   Fibromyalgia    Gastric ulcer    Heart murmur    faintly heard since TAVR 08-14-2020   HLD (hyperlipidemia)    HSV (herpes simplex virus) infection    on hip--on daily suppression   Hypertension  Hypothyroidism    took med 7 yrs after birth of 1st child   Impaired glucose tolerance    Migraine    Nonalcoholic steatohepatitis (NASH) 03/23/2022   On home oxygen  therapy    "have it available but I'm not using it" (05/15/2017)   Osteopenia    Personal history of breast cancer 11/15/2021   S/P TAVR (transcatheter aortic valve replacement) 08/14/2020   s/p TAVR with a 26 mm Edwards S3U via the left subclavian approach by Dr. Alva Jewels and Dr. Abel Hoe   Severe aortic stenosis    Severe obesity (BMI >= 40) (HCC) 10/06/2014   Sleep apnea    Sleeps with 2 L O2 @ night   Type 2 diabetes mellitus (HCC)    Urothelial cancer (HCC) 03/10/2017   Incidental bladder mass noted on CT, papillary urothelial CA on pathology from excision 12/2016; post-op instillation of chemo. Due for f/u cystoscopy 03/2017   Venous insufficiency  09/10/2018   Ventral hernia    Ventral hernia without obstruction or gangrene 05/15/2017   Vitamin D  deficiency disease      Past Surgical History:  Procedure Laterality Date   ABDOMINAL HERNIA REPAIR  05/15/2017   BIOPSY  07/20/2023   Procedure: BIOPSY;  Surgeon: Ace Holder, MD;  Location: [facility];  Service: Gastroenterology;;   BREAST BIOPSY Right 1992   BREAST BIOPSY Left 2018   BREAST EXCISIONAL BIOPSY Left 2018   ATYPICAL DUCTAL HYPERPLASIA INVOLVING A COMPLEX   BREAST LUMPECTOMY WITH RADIOACTIVE SEED LOCALIZATION Left 12/22/2016   Procedure: LEFT BREAST LUMPECTOMY WITH RADIOACTIVE SEED LOCALIZATION;  Surgeon: Caralyn Chandler, MD;  Location: [facility] OR;  Service: General;  Laterality: Left;   CARDIAC CATHETERIZATION     CATARACT EXTRACTION, BILATERAL Bilateral R 03/16/2019 L 03/30/2019   Dr. Reyne Cave   COLONOSCOPY  2009, 08/2010, 12/2015   Dr. Tova Fresh; "only 1 had any polyps" (05/15/2017)   COLONOSCOPY WITH PROPOFOL  N/A 07/20/2023   Procedure: COLONOSCOPY WITH PROPOFOL ;  Surgeon: Ace Holder, MD;  Location: [facility];  Service: Gastroenterology;  Laterality: N/A;   CYSTOSCOPY  04/23/2017   CYSTOSCOPY W/ RETROGRADES Bilateral 01/12/2017   Procedure: CYSTOSCOPY WITH RETROGRADE PYELOGRAM/ EXAM UNDER ANESTHESIA;  Surgeon: Florencio Hunting, MD;  Location: [facility];  Service: Urology;  Laterality: Bilateral;   ENDOVENOUS ABLATION SAPHENOUS VEIN W/ LASER Right 01/09/2022   endovenous laser ablation right greater saphenous vein by Angela Kell MD   ESOPHAGOGASTRODUODENOSCOPY (EGD) WITH PROPOFOL  N/A 07/20/2023   Procedure: ESOPHAGOGASTRODUODENOSCOPY (EGD) WITH PROPOFOL ;  Surgeon: Ace Holder, MD;  Location: [facility];  Service: Gastroenterology;  Laterality: N/A;   EYE SURGERY Bilateral    Cataracts removed   FEMUR IM NAIL Right 08/24/2019   Procedure: INTRAMEDULLARY (IM) NAIL FEMORAL;  Surgeon: Adonica Hoose, MD;  Location: [facility];  Service: Orthopedics;   Laterality: Right;   HERNIA REPAIR     INSERTION OF MESH N/A 05/15/2017   Procedure: INSERTION OF MESH;  Surgeon: Caralyn Chandler, MD;  [facility] OR;  Service: General;  Laterality: N/A;   LAPAROSCOPIC CHOLECYSTECTOMY  2003   MASTECTOMY Right 1992   POLYPECTOMY  07/20/2023   Procedure: POLYPECTOMY;  Surgeon: Ace Holder, MD;  Location: [facility];  Service: Gastroenterology;;   RIGHT/LEFT HEART CATH AND CORONARY ANGIOGRAPHY N/A 07/05/2020   Procedure: RIGHT/LEFT HEART CATH AND CORONARY ANGIOGRAPHY;  Surgeon: Odie Benne, MD;  Location: [facility];  Service: Cardiovascular;  Laterality: N/A;   TEE WITHOUT CARDIOVERSION N/A 08/14/2020   Procedure: TRANSESOPHAGEAL  ECHOCARDIOGRAM (TEE);  Surgeon: Odie Benne, MD;  Location: [facility] OR;  Service: Open Heart Surgery;  Laterality: N/A;   TRANSURETHRAL RESECTION OF BLADDER TUMOR WITH MITOMYCIN -C N/A 01/12/2017   Procedure: TRANSURETHRAL RESECTION OF BLADDER TUMOR WITH POSSIBLE POST OPERATIVE INSTILLATION OF MITOMYCIN -C;  Surgeon: Florencio Hunting, MD;  Location: [facility];  Service: Urology;  Laterality: N/A;   TYMPANOSTOMY TUBE PLACEMENT Bilateral 1980s   UMBILICAL HERNIA REPAIR  2003   VENTRAL HERNIA REPAIR N/A 05/15/2017   Procedure: VENTRAL HERNIA REPAIR WITH MESH;  Surgeon: Caralyn Chandler, MD;  Location: [facility] OR;  Service: General;  Laterality: N/A;   Family History  Problem Relation Age of Onset   Heart disease Mother        tachycardia   Heart disease Father    Diabetes Father    Hypertension Father    Asthma Sister    Allergies Sister    Hyperlipidemia Sister    Hashimoto's thyroiditis Sister    Fibromyalgia Sister    Skin cancer Sister        leg; dx after 62; surgery only   Arthritis Sister    Hypertension Sister    Kidney disease Sister    Varicose Veins Sister    Asthma Sister    Allergies Sister    Hyperlipidemia Sister    Hashimoto's thyroiditis Sister    Fibromyalgia Sister    Arthritis  Sister    Breast cancer Sister 25       BRCA2 positive; metastatic in late 50s   Arthritis Brother    Stomach cancer Maternal Grandmother    Cancer Maternal Grandmother        deceased 21; unk. primary; possibly stomach   Tuberculosis Maternal Grandfather    Stroke Paternal Grandmother 50       died of cerebral hemorrhage   ADD / ADHD Son    Depression Son    Other Niece        BRCA2 positive; prophylatic mastectomy   Cancer Other        unknown GYN cancers; MGM's sisters   Colon cancer Neg Hx    Esophageal cancer Neg Hx    Pancreatic cancer Neg Hx    Social History   Tobacco Use   Smoking status: Former    Current packs/day: 0.00    Average packs/day: 1 pack/day for 46.0 years (46.0 ttl pk-yrs)    Types: Cigarettes    Start date: 05/30/1965    Quit date: 05/31/2011    Years since quitting: 12.4   Smokeless tobacco: Never  Vaping Use   Vaping status: Never Used  Substance Use Topics   Alcohol  use: No   Drug use: No   Current Outpatient Medications  Medication Sig Dispense Refill   acetaminophen  (TYLENOL ) 500 MG tablet Take 500-1,000 mg by mouth every 6 (six) hours as needed for moderate pain (pain score 4-6) or headache.     albuterol  (PROVENTIL ) (2.5 MG/3ML) 0.083% nebulizer solution Take 3 mLs (2.5 mg total) by nebulization every 6 (six) hours as needed for wheezing or shortness of breath. 75 mL 12   albuterol  (VENTOLIN  HFA) 108 (90 Base) MCG/ACT inhaler Inhale 2 puffs into the lungs every 6 (six) hours as needed for wheezing or shortness of breath. 3 each 3   Alcohol  Swabs  (B-D SINGLE USE SWABS  REGULAR) PADS Use twice a day when checking blood sugars 100 each 1   amoxicillin  (AMOXIL ) 500 MG capsule Take 2,000 mg by mouth See admin instructions. Take 2000  mg 1 hour prior to dental work     apixaban  (ELIQUIS ) 2.5 MG TABS tablet Take 1 tablet (2.5 mg total) by mouth 2 (two) times daily. 180 tablet 1   bisoprolol -hydrochlorothiazide  (ZIAC ) 10-6.25 MG tablet TAKE 1 TABLET  EVERY DAY 90 tablet 0   cetirizine (ZYRTEC) 10 MG tablet Take 10 mg by mouth daily.     clobetasol cream (TEMOVATE) 0.05 % Apply 1 Application topically 2 (two) times daily as needed (eczema).     escitalopram  (LEXAPRO ) 10 MG tablet Take 1 tablet (10 mg total) by mouth daily. 90 tablet 1   guaiFENesin  (MUCINEX ) 600 MG 12 hr tablet Take 600 mg by mouth at bedtime.     hydrocortisone cream 1 % Apply 1 Application topically 2 (two) times daily as needed for itching.     metFORMIN  (GLUCOPHAGE -XR) 500 MG 24 hr tablet TAKE 1 TABLET EVERY DAY WITH BREAKFAST 90 tablet 0   montelukast  (SINGULAIR ) 10 MG tablet Take 1 tablet (10 mg total) by mouth at bedtime. 90 tablet 3   Multiple Vitamins-Minerals (PRESERVISION AREDS 2 PO) Take 1 each by mouth 2 (two) times daily.     omeprazole  (PRILOSEC) 40 MG capsule TAKE 1 CAPSULE TWICE DAILY 180 capsule 3   Semaglutide , 1 MG/DOSE, (OZEMPIC , 1 MG/DOSE,) 4 MG/3ML SOPN Inject 1 mg into the skin every Friday.     simvastatin  (ZOCOR ) 20 MG tablet Take 1 tablet (20 mg total) by mouth at bedtime. 90 tablet 1   TRELEGY ELLIPTA  100-62.5-25 MCG/ACT AEPB INHALE 1 PUFF INTO THE LUNGS DAILY. 180 each 3   triamcinolone  cream (KENALOG ) 0.1 % Apply 1 Application topically 2 (two) times daily as needed (eczema/dry skin).     TRUEplus Lancets 30G MISC TEST BLOOD SUGAR EVERY DAY 100 each 3   valACYclovir  (VALTREX ) 500 MG tablet TAKE 1 TABLET EVERY DAY 90 tablet 0   ACCU-CHEK AVIVA PLUS test strip TEST BLOOD SUGAR EVERY DAY 100 strip 3   No current facility-administered medications for this visit.   Allergies  Allergen Reactions   Contrast Media [Iodinated Contrast Media] Anaphylaxis, Shortness Of Breath and Other (See Comments)    Could not breath   Iohexol  Anaphylaxis, Shortness Of Breath and Other (See Comments)    Immediately could not breathe   Lisinopril Anaphylaxis, Shortness Of Breath and Rash   Sulfa Antibiotics Anaphylaxis and Other (See Comments)    Historical from  mother, pt states that mother says she almost died from this drug   Latex Other (See Comments)    Unless against on skin for a long time, blisters. Short term is okay.   Codeine Nausea Only, Anxiety and Other (See Comments)    insomnia   Levofloxacin Other (See Comments)    insomnia   Lipitor [Atorvastatin Calcium ] Rash   Meloxicam Rash    Broke out in a rash on her stomach,back, legs, and behind ears.   Nickel Other (See Comments)    With earrings pt has soreness and drainage from piercing   Vicodin [Hydrocodone -Acetaminophen ] Itching     Review of Systems: All systems reviewed and negative except where noted in HPI.   MELD 3.0: 11 at 04/28/2023  3:44 PM MELD-Na: 11 at 04/28/2023  3:44 PM Calculated from: Serum Creatinine: 0.64 mg/dL (Using min of 1 mg/dL) at 08/65/7846  9:62 PM Serum Sodium: 138 mEq/L (Using max of 137 mEq/L) at 04/28/2023  3:44 PM Total Bilirubin: 1.1 mg/dL at 95/28/4132  4:40 PM Serum Albumin : 3.9 g/dL (Using max of  3.5 g/dL) at 16/03/9603  5:40 PM INR(ratio): 1.4 ratio at 04/28/2023  3:44 PM Age at listing (hypothetical): 76 years Sex: Female at 04/28/2023  3:44 PM   Lab Results  Component Value Date   NA 140 09/24/2023   CL 106 09/24/2023   K 3.9 09/24/2023   CO2 31 09/24/2023   BUN 19 09/24/2023   CREATININE 0.62 09/24/2023   GFRNONAA >60 09/24/2023   CALCIUM  9.1 09/24/2023   PHOS 2.8 03/23/2022   ALBUMIN  3.8 09/24/2023   GLUCOSE 113 (H) 09/24/2023    Lab Results  Component Value Date   ALT 22 09/24/2023   AST 22 09/24/2023   ALKPHOS 92 09/24/2023   BILITOT 0.9 09/24/2023    Lab Results  Component Value Date   WBC 5.2 09/24/2023   HGB 13.1 09/24/2023   HCT 41.2 09/24/2023   MCV 92.4 09/24/2023   PLT 107 (L) 09/24/2023    Lab Results  Component Value Date   INR 1.4 (H) 04/28/2023   INR 1.1 11/20/2022   INR 1.1 (H) 09/13/2021     Physical Exam: BP (!) 112/58   Pulse 68   Ht 5\' 2"  (1.575 m)   LMP  (LMP Unknown)   SpO2  95%   BMI 39.95 kg/m  Constitutional: Pleasant,well-developed, female in no acute distress. Skin: Skin is warm and dry. No rashes noted. Psychiatric: Normal mood and affect. Behavior is normal.   ASSESSMENT: 78 y.o. female here for assessment of the following  1. Cirrhosis of liver without ascites, unspecified hepatic cirrhosis type (HCC)   2. Gastric ulcer without hemorrhage or perforation, unspecified chronicity   3. Long-term current use of proton pump inhibitor therapy    We discussed her history of cirrhosis which is very likely due to [facility].  However, other than a remote alpha-1 antitrypsin level I do not see a serologic workup for cirrhosis remotely.  Possible she could have had that out of the system but recommend full serologic workup for this to ensure okay and check immunity to hepatitis A and B, she is willing to do labs today.  Also due for [facility] screening, we discussed the importance of that, we will check an AFP as well as a right upper quadrant ultrasound.  Discussed cirrhosis in general, she has been compensated since diagnosis which is reassuring.  She is stable in this regard.  Discussed risk for decompensation and HCC over time and needs to be monitored every 6 months.  No evidence of varices on recent EGD.  Incidentally during EGD she had some gastric ulcers.  Tested negative for H. pylori, denies NSAID use.  This was seen while on omeprazole  once daily.  We increased her omeprazole  to twice daily dosing.  We discussed given her medical problems, she is higher than average risk for anesthesia, if she wanted to pursue a repeat EGD.  She is a bit anxious about this in light of her grandmother's history of gastric cancer.  I reassured her that I think these ulcers are not precancerous given biopsy results, I am more concerned about her bleeding risk on anticoagulation if we are not sure that the ulcers have healed.  She has no anemia.  She tolerated anesthesia well in the past, COPD  well-controlled, she understands risks and benefits of anesthesia and wishes to proceed with repeat EGD at the hospital, will coordinate at [facility].  PLAN: - serologic workup for cirrhosis, AFP today - RUQ US  for HCC screening - no NSAIDs - continue  omeprazole  BID for now - counseled her on long term risks of chronic PPI use - schedule EGD at hospital, [facility]  Christi Coward, MD [facility] Gastroenterology

## 2023-10-22 NOTE — Patient Instructions (Signed)
 Your provider has requested that you go to the basement level for lab work before leaving today. Press "B" on the elevator. The lab is located at the first door on the left as you exit the elevator.  You have been scheduled for an abdominal ultrasound at [facility] Radiology (1st floor of hospital) on 10/23/23 at 10:30am. Please arrive 30 minutes prior to your appointment for registration. Make certain not to have anything to eat or drink 6 hours prior to your appointment. Should you need to reschedule your appointment, please contact radiology at [phone]. This test typically takes about 30 minutes to perform.  You have been scheduled for an endoscopy. Please follow written instructions given to you at your visit today.  If you use inhalers (even only as needed), please bring them with you on the day of your procedure.  If you take any of the following medications, they will need to be adjusted prior to your procedure:   DO NOT TAKE 7 DAYS PRIOR TO TEST- Trulicity (dulaglutide) Ozempic , Wegovy  (semaglutide ) Mounjaro (tirzepatide) Bydureon Bcise (exanatide extended release)  DO NOT TAKE 1 DAY PRIOR TO YOUR TEST Rybelsus  (semaglutide ) Adlyxin (lixisenatide) Victoza (liraglutide) Byetta (exanatide) ___________________________________________________________________________ _  X_   ORAL DIABETIC MEDICATION INSTRUCTIONS  The day before your procedure:  Take your diabetic pill as you do normally  The day of your procedure:  Do not take your diabetic pill   We will check your blood sugar levels during the admission process and again in Recovery before discharging you home  ______________________________________________________________________                    __ X __ GLP 1 AGONIST Weekly injectables                   (Bydureon Bcise, Ozempic , Wegovy , Trulicity, Zepbound, Mounjaro)    Hold for 7 days prior to the procedure    REMAIN  ON ELIQUIS  FOR THE PROCEDURE  If  your blood pressure at your visit was 140/90 or greater, please contact your primary care physician to follow up on this.  _______________________________________________________  If you are age 5 or older, your body mass index should be between 23-30. Your Body mass index is 39.95 kg/m. If this is out of the aforementioned range listed, please consider follow up with your Primary Care Provider.  If you are age 52 or younger, your body mass index should be between 19-25. Your Body mass index is 39.95 kg/m. If this is out of the aformentioned range listed, please consider follow up with your Primary Care Provider.   ________________________________________________________  The [place] [facility] providers would like to encourage you to use MYCHART to communicate with providers for non-urgent requests or questions.  Due to long hold times on the telephone, sending your provider a message by [facility] may be a faster and more efficient way to get a response.  Please allow 48 business hours for a response.  Please remember that this is for non-urgent requests.  _______________________________________________________

## 2023-10-23 ENCOUNTER — Ambulatory Visit (HOSPITAL_COMMUNITY)
Admission: RE | Admit: 2023-10-23 | Discharge: 2023-10-23 | Disposition: A | Discharge status: Home / Self Care | Source: Ambulatory Visit | Attending: Gastroenterology | Admitting: Gastroenterology

## 2023-10-23 ENCOUNTER — Encounter: Payer: Self-pay | Admitting: Gastroenterology

## 2023-10-23 DIAGNOSIS — Z9049 Acquired absence of other specified parts of digestive tract: Secondary | ICD-10-CM | POA: Diagnosis not present

## 2023-10-23 DIAGNOSIS — K7460 Unspecified cirrhosis of liver: Secondary | ICD-10-CM | POA: Insufficient documentation

## 2023-10-26 ENCOUNTER — Other Ambulatory Visit: Payer: Self-pay

## 2023-10-26 DIAGNOSIS — K7460 Unspecified cirrhosis of liver: Secondary | ICD-10-CM

## 2023-10-26 LAB — ANTI-SMOOTH MUSCLE ANTIBODY, IGG: Actin (Smooth Muscle) Antibody (IGG): <20 U (ref <20)

## 2023-10-26 LAB — MITOCHONDRIAL ANTIBODIES: Mitochondrial M2 Ab, IgG: <=20 U (ref <=20.0)

## 2023-10-26 LAB — HEPATITIS B SURFACE ANTIGEN: Hepatitis B Surface Ag: NONREACTIVE

## 2023-10-26 LAB — HEPATITIS B SURFACE ANTIBODY,QUALITATIVE: Hep B S Ab: NONREACTIVE

## 2023-10-26 LAB — ANA: Anti Nuclear Antibody (ANA): NEGATIVE

## 2023-10-26 LAB — AFP TUMOR MARKER: AFP-Tumor Marker: 2.9 ng/mL

## 2023-10-26 LAB — IGG: IgG (Immunoglobin G), Serum: 910 mg/dL (ref 600–1540)

## 2023-10-26 LAB — HEPATITIS C ANTIBODY: Hepatitis C Ab: NONREACTIVE

## 2023-10-27 ENCOUNTER — Other Ambulatory Visit: Payer: Self-pay

## 2023-10-27 ENCOUNTER — Other Ambulatory Visit

## 2023-10-27 DIAGNOSIS — K7460 Unspecified cirrhosis of liver: Secondary | ICD-10-CM

## 2023-10-27 NOTE — Progress Notes (Signed)
 Patient went to lab today, 4-29

## 2023-10-28 ENCOUNTER — Other Ambulatory Visit: Payer: Self-pay | Admitting: Family Medicine

## 2023-10-28 ENCOUNTER — Telehealth: Payer: Self-pay | Admitting: Gastroenterology

## 2023-10-28 ENCOUNTER — Other Ambulatory Visit: Payer: Self-pay

## 2023-10-28 ENCOUNTER — Encounter (HOSPITAL_BASED_OUTPATIENT_CLINIC_OR_DEPARTMENT_OTHER): Payer: Self-pay

## 2023-10-28 ENCOUNTER — Ambulatory Visit (HOSPITAL_BASED_OUTPATIENT_CLINIC_OR_DEPARTMENT_OTHER): Admitting: Pulmonary Disease

## 2023-10-28 DIAGNOSIS — E7800 Pure hypercholesterolemia, unspecified: Secondary | ICD-10-CM

## 2023-10-28 DIAGNOSIS — B009 Herpesviral infection, unspecified: Secondary | ICD-10-CM

## 2023-10-28 DIAGNOSIS — I10 Essential (primary) hypertension: Secondary | ICD-10-CM

## 2023-10-28 LAB — HEPATITIS A ANTIBODY, TOTAL: Hepatitis A AB,Total: REACTIVE — AB

## 2023-10-28 NOTE — Telephone Encounter (Signed)
 Patient called and stated that she is requesting a call back from Jan to go over her Lab results. Patient is requesting a call back. Please advise.

## 2023-10-28 NOTE — Telephone Encounter (Signed)
 See lab results.

## 2023-10-28 NOTE — Telephone Encounter (Signed)
 Is this okay to refill?

## 2023-11-02 ENCOUNTER — Ambulatory Visit (INDEPENDENT_AMBULATORY_CARE_PROVIDER_SITE_OTHER)

## 2023-11-02 DIAGNOSIS — Z23 Encounter for immunization: Secondary | ICD-10-CM | POA: Diagnosis not present

## 2023-11-02 DIAGNOSIS — M1611 Unilateral primary osteoarthritis, right hip: Secondary | ICD-10-CM | POA: Diagnosis not present

## 2023-11-03 ENCOUNTER — Other Ambulatory Visit: Payer: Self-pay | Admitting: Family Medicine

## 2023-11-03 DIAGNOSIS — Z1231 Encounter for screening mammogram for malignant neoplasm of breast: Secondary | ICD-10-CM

## 2023-11-06 ENCOUNTER — Other Ambulatory Visit (HOSPITAL_BASED_OUTPATIENT_CLINIC_OR_DEPARTMENT_OTHER): Payer: Self-pay

## 2023-11-06 DIAGNOSIS — J449 Chronic obstructive pulmonary disease, unspecified: Secondary | ICD-10-CM

## 2023-11-06 DIAGNOSIS — G4734 Idiopathic sleep related nonobstructive alveolar hypoventilation: Secondary | ICD-10-CM

## 2023-11-25 ENCOUNTER — Telehealth: Payer: Self-pay | Admitting: Gastroenterology

## 2023-11-25 NOTE — Telephone Encounter (Signed)
 Procedure:Endoscopy Procedure date: 12/03/23 Procedure location: [facility] Arrival Time: 9:30 am Spoke with the patient Y/N: Yes Any prep concerns? No  Has the patient obtained the prep from the pharmacy ? No prep needed Do you have a care partner and transportation: Yes Any additional concerns? No

## 2023-11-26 ENCOUNTER — Other Ambulatory Visit: Payer: Self-pay | Admitting: Pulmonary Disease

## 2023-11-27 ENCOUNTER — Encounter (HOSPITAL_COMMUNITY): Payer: Self-pay | Admitting: Gastroenterology

## 2023-11-27 NOTE — Progress Notes (Signed)
 Attempted to obtain medical history for pre op call via telephone, unable to reach at this time. HIPAA compliant voicemail message left requesting return call to pre surgical testing department.

## 2023-12-03 ENCOUNTER — Other Ambulatory Visit: Payer: Self-pay

## 2023-12-03 ENCOUNTER — Ambulatory Visit (HOSPITAL_COMMUNITY): Admitting: Anesthesiology

## 2023-12-03 ENCOUNTER — Encounter (HOSPITAL_COMMUNITY): Payer: Self-pay | Admitting: Gastroenterology

## 2023-12-03 ENCOUNTER — Ambulatory Visit (HOSPITAL_COMMUNITY)
Admission: RE | Admit: 2023-12-03 | Discharge: 2023-12-03 | Disposition: A | Discharge status: Home / Self Care | Source: Ambulatory Visit | Attending: Gastroenterology | Admitting: Gastroenterology

## 2023-12-03 ENCOUNTER — Encounter (HOSPITAL_COMMUNITY)
Admission: RE | Disposition: A | Discharge status: Home / Self Care | Payer: Self-pay | Source: Ambulatory Visit | Attending: Gastroenterology

## 2023-12-03 DIAGNOSIS — K449 Diaphragmatic hernia without obstruction or gangrene: Secondary | ICD-10-CM | POA: Diagnosis not present

## 2023-12-03 DIAGNOSIS — K7460 Unspecified cirrhosis of liver: Secondary | ICD-10-CM | POA: Insufficient documentation

## 2023-12-03 DIAGNOSIS — E119 Type 2 diabetes mellitus without complications: Secondary | ICD-10-CM | POA: Insufficient documentation

## 2023-12-03 DIAGNOSIS — Z87891 Personal history of nicotine dependence: Secondary | ICD-10-CM | POA: Diagnosis not present

## 2023-12-03 DIAGNOSIS — M797 Fibromyalgia: Secondary | ICD-10-CM | POA: Diagnosis not present

## 2023-12-03 DIAGNOSIS — K259 Gastric ulcer, unspecified as acute or chronic, without hemorrhage or perforation: Secondary | ICD-10-CM

## 2023-12-03 DIAGNOSIS — G4733 Obstructive sleep apnea (adult) (pediatric): Secondary | ICD-10-CM | POA: Insufficient documentation

## 2023-12-03 DIAGNOSIS — K229 Disease of esophagus, unspecified: Secondary | ICD-10-CM | POA: Diagnosis not present

## 2023-12-03 DIAGNOSIS — K2900 Acute gastritis without bleeding: Secondary | ICD-10-CM | POA: Diagnosis not present

## 2023-12-03 DIAGNOSIS — Z8711 Personal history of peptic ulcer disease: Secondary | ICD-10-CM | POA: Diagnosis not present

## 2023-12-03 DIAGNOSIS — F419 Anxiety disorder, unspecified: Secondary | ICD-10-CM | POA: Insufficient documentation

## 2023-12-03 DIAGNOSIS — I2510 Atherosclerotic heart disease of native coronary artery without angina pectoris: Secondary | ICD-10-CM | POA: Insufficient documentation

## 2023-12-03 DIAGNOSIS — Z09 Encounter for follow-up examination after completed treatment for conditions other than malignant neoplasm: Secondary | ICD-10-CM | POA: Diagnosis not present

## 2023-12-03 DIAGNOSIS — Z7985 Long-term (current) use of injectable non-insulin antidiabetic drugs: Secondary | ICD-10-CM | POA: Diagnosis not present

## 2023-12-03 DIAGNOSIS — K219 Gastro-esophageal reflux disease without esophagitis: Secondary | ICD-10-CM | POA: Insufficient documentation

## 2023-12-03 DIAGNOSIS — Z79899 Other long term (current) drug therapy: Secondary | ICD-10-CM | POA: Insufficient documentation

## 2023-12-03 DIAGNOSIS — Z9981 Dependence on supplemental oxygen: Secondary | ICD-10-CM | POA: Diagnosis not present

## 2023-12-03 DIAGNOSIS — K3189 Other diseases of stomach and duodenum: Secondary | ICD-10-CM

## 2023-12-03 DIAGNOSIS — Z7901 Long term (current) use of anticoagulants: Secondary | ICD-10-CM | POA: Diagnosis not present

## 2023-12-03 DIAGNOSIS — I8500 Esophageal varices without bleeding: Secondary | ICD-10-CM

## 2023-12-03 DIAGNOSIS — J449 Chronic obstructive pulmonary disease, unspecified: Secondary | ICD-10-CM | POA: Diagnosis not present

## 2023-12-03 DIAGNOSIS — K2289 Other specified disease of esophagus: Secondary | ICD-10-CM

## 2023-12-03 DIAGNOSIS — Z952 Presence of prosthetic heart valve: Secondary | ICD-10-CM | POA: Diagnosis not present

## 2023-12-03 DIAGNOSIS — I10 Essential (primary) hypertension: Secondary | ICD-10-CM | POA: Insufficient documentation

## 2023-12-03 DIAGNOSIS — Z86711 Personal history of pulmonary embolism: Secondary | ICD-10-CM | POA: Insufficient documentation

## 2023-12-03 HISTORY — PX: ESOPHAGOGASTRODUODENOSCOPY: SHX5428

## 2023-12-03 HISTORY — PX: BIOPSY OF SKIN SUBCUTANEOUS TISSUE AND/OR MUCOUS MEMBRANE: SHX6741

## 2023-12-03 LAB — GLUCOSE, CAPILLARY: Glucose-Capillary: 109 mg/dL — ABNORMAL HIGH (ref 70–99)

## 2023-12-03 SURGERY — EGD (ESOPHAGOGASTRODUODENOSCOPY)
Anesthesia: Monitor Anesthesia Care

## 2023-12-03 MED ORDER — LIDOCAINE 2% (20 MG/ML) 5 ML SYRINGE
INTRAMUSCULAR | Status: DC | PRN
  Administered 2023-12-03: 100 mg via INTRAVENOUS

## 2023-12-03 MED ORDER — PROPOFOL 10 MG/ML IV BOLUS
INTRAVENOUS | Status: DC | PRN
  Administered 2023-12-03: 30 mg via INTRAVENOUS
  Administered 2023-12-03 (×2): 40 mg via INTRAVENOUS
  Administered 2023-12-03: 80 mg via INTRAVENOUS
  Administered 2023-12-03: 50 mg via INTRAVENOUS
  Administered 2023-12-03 (×2): 40 mg via INTRAVENOUS
  Administered 2023-12-03: 30 mg via INTRAVENOUS

## 2023-12-03 MED ORDER — PROPOFOL 500 MG/50ML IV EMUL
INTRAVENOUS | Status: AC
  Filled 2023-12-03: qty 50

## 2023-12-03 MED ORDER — SODIUM CHLORIDE 0.9 % IV SOLN: INTRAVENOUS | Status: DC

## 2023-12-03 MED ORDER — SODIUM CHLORIDE 0.9% FLUSH: 3.0000 mL | Freq: Two times a day (BID) | INTRAVENOUS | Status: DC

## 2023-12-03 MED ORDER — FLUCONAZOLE 200 MG PO TABS: 200.0000 mg | ORAL_TABLET | Freq: Every day | ORAL | 0 refills | Status: DC

## 2023-12-03 MED ORDER — PHENYLEPHRINE 80 MCG/ML (10ML) SYRINGE FOR IV PUSH (FOR BLOOD PRESSURE SUPPORT)
PREFILLED_SYRINGE | INTRAVENOUS | Status: DC | PRN
  Administered 2023-12-03 (×3): 40 ug via INTRAVENOUS
  Administered 2023-12-03: 80 ug via INTRAVENOUS

## 2023-12-03 MED ORDER — SODIUM CHLORIDE 0.9% FLUSH: 3.0000 mL | INTRAVENOUS | Status: DC | PRN

## 2023-12-03 NOTE — Transfer of Care (Signed)
 Immediate Anesthesia Transfer of Care Note  Patient: Shannon Schultz  Procedure(s) Performed: EGD (ESOPHAGOGASTRODUODENOSCOPY) BIOPSY, SKIN, SUBCUTANEOUS TISSUE, OR MUCOUS MEMBRANE  Patient Location: PACU  Anesthesia Type:MAC  Level of Consciousness: awake, alert , and oriented  Airway & Oxygen  Therapy: Patient Spontanous Breathing and Patient connected to face mask oxygen   Post-op Assessment: Report given to RN and Post -op Vital signs reviewed and stable  Post vital signs: Reviewed and stable  Last Vitals:  Vitals Value Taken Time  BP    Temp    Pulse 65 12/03/23 1119  Resp 17 12/03/23 1119  SpO2 100 % 12/03/23 1119  Vitals shown include unfiled device data.  Last Pain:  Vitals:   12/03/23 1017  TempSrc: Temporal  PainSc: 0-No pain      Patients Stated Pain Goal: 0 (12/03/23 1017)  Complications: No notable events documented.

## 2023-12-03 NOTE — Discharge Instructions (Signed)

## 2023-12-03 NOTE — Op Note (Addendum)
 [facility] Patient Name: Shannon Schultz Procedure Date: 12/03/2023 MRN: [id number] Attending MD: Lendon Queen. General Kenner , MD, [id number] Date of Birth: 16-Jun-1946 CSN: [id number] Age: 78 Admit Type: Outpatient Procedure:                Upper GI endoscopy Indications:              Follow-up of gastric ulcer - noted during varices                            screening in January (fundus and gastric body) - no                            NSAID use, H pylori negative. Now on omeprazole                             twice daily. Providers:                Lendon Queen. General Kenner, MD, Suzann Ernst, RN, Joline Ned, Technician Referring MD:              Medicines:                Monitored Anesthesia Care Complications:            No immediate complications. Estimated blood loss:                            Minimal. Estimated Blood Loss:     Estimated blood loss was minimal. Procedure:                Pre-Anesthesia Assessment:                           - Prior to the procedure, a History and Physical                            was performed, and patient medications and                            allergies were reviewed. The patient's tolerance of                            previous anesthesia was also reviewed. The risks                            and benefits of the procedure and the sedation                            options and risks were discussed with the patient.                            All questions were answered, and informed consent  was obtained. Prior Anticoagulants: The patient has                            taken Eliquis  (apixaban ), last dose was 1 day prior                            to procedure. ASA Grade Assessment: III - A patient                            with severe systemic disease. After reviewing the                            risks and benefits, the patient was deemed in                             satisfactory condition to undergo the procedure.                           After obtaining informed consent, the endoscope was                            passed under direct vision. Throughout the                            procedure, the patient's blood pressure, pulse, and                            oxygen  saturations were monitored continuously. The                            [id number]-[id number] ([id number]) Olympus endoscope was introduced                            through the mouth, and advanced to the second part                            of duodenum. The upper GI endoscopy was                            accomplished without difficulty. The patient                            tolerated the procedure well. Scope In: Scope Out: Findings:      Esophagogastric landmarks were identified: the Z-line was found at 40       cm, the gastroesophageal junction was found at 40 cm and the upper       extent of the gastric folds was found at 42 cm from the incisors.      A 2 cm hiatal hernia was present.      Mid esophagus around 30cm from the incisors there was focal esophagel       varices (Grade II) vs. vascular bleb. I previously had thought this more       so a vascular bleb on the last exam given there  was no continuity       proximally or distally, however small column of varices were suspected       slightly distal and proximal to this, favor esophagel varix at this       time. No high risk stigmata for bleeding noted.      Patchy, white plaques were found in the lower third of the esophagus,       grossly consistent with esophagel candidiasis.      The exam of the esophagus was otherwise normal.      Mucosal changes were found in the gastric body. The previously noted       ulcer that had healed and some nodular mucosa with focal erosion is       noted in the mid gastric body. Biopsies were taken with a cold forceps       for histology. Mucosa was friable with oozing. There appeared to be        adherent white plaques more proximal to the nodular / erosion area, in a       few areas. Despite extensive lavage could not clear the underlying       mucosa - possible this represents an ulcer but seemed more so an       adherent plaque or perhaps related to retained pill coating? Peripheral       biopsies obtained in these areas.      The exam of the stomach was otherwise normal. Interval healing of the       fundus ulcer. No gastric varices.      The examined duodenum was normal. Impression:               - Esophagogastric landmarks identified.                           - 2 cm hiatal hernia.                           - Suspected mid esophagus varices vs. vascular                            bleb, favor varices given appearance today.                           - Esophageal plaques were found, consistent with                            candidiasis in the distal esophagus.                           - Mucosal changes in the gastric body as outlined -                            healing of previously noted ulcers, one area of                            residual nodular mucosa with erosion - biopsied,                            and another area of suspected adherent white  plaque, seems less likely ulcer. Biopsied.                           - No gastric varices.                           - Normal examined duodenum. Moderate Sedation:      No moderate sedation, case performed with MAC Recommendation:           - Patient has a contact number available for                            emergencies. The signs and symptoms of potential                            delayed complications were discussed with the                            patient. Return to normal activities tomorrow.                            Written discharge instructions were provided to the                            patient.                           - Resume previous diet.                           -  Continue present medications.                           - Continue omeprazole  twice daily for now given                            findings                           - Await pathology results.                           - Start fluconazole if no medication interactions /                            allergies for esophageal candidiasis                           - Will need to reduce Zocor  dose while on                            fluconazole (take every other day or once every 3                            days while on fluconazole)                           -  Will discuss with patient's primary care if she                            is a candidate for nonselective beta blockade                            (COPD?) to treat varices - appears she is already                            on bisoprolol .                           - Resume Eliquis  tomorrow given biopsies today and                            friable mucosa Procedure Code(s):        --- Professional ---                           [phone], Esophagogastroduodenoscopy, flexible,                            transoral; with biopsy, single or multiple Diagnosis Code(s):        --- Professional ---                           K44.9, Diaphragmatic hernia without obstruction or                            gangrene                           I85.00, Esophageal varices without bleeding                           K22.9, Disease of esophagus, unspecified                           K31.89, Other diseases of stomach and duodenum                           K25.9, Gastric ulcer, unspecified as acute or                            chronic, without hemorrhage or perforation CPT copyright 2022 [employer]. All rights reserved. The codes documented in this report are preliminary and upon coder review may  be revised to meet current compliance requirements. Landon Pinion P. Foye Haggart, MD 12/03/2023 11:31:55 AM This report has been signed  electronically. Number of Addenda: 0

## 2023-12-03 NOTE — Anesthesia Preprocedure Evaluation (Signed)
 Anesthesia Evaluation  Patient identified by MRN, date of birth, ID band Patient awake    Reviewed: Allergy & Precautions, NPO status , Patient's Chart, lab work & pertinent test results, reviewed documented beta blocker date and time   History of Anesthesia Complications (+) PROLONGED EMERGENCE and history of anesthetic complications  Airway Mallampati: II  TM Distance: >3 FB     Dental no notable dental hx.    Pulmonary sleep apnea , pneumonia, COPD, former smoker   breath sounds clear to auscultation       Cardiovascular hypertension, + CAD  + Valvular Problems/Murmurs (s/p TAVR with poss PPM) AS  Rhythm:Regular Rate:Normal  1. Left ventricular ejection fraction, by estimation, is 55 to 60%. The  left ventricle has normal function. The left ventricle has no regional  wall motion abnormalities. There is mild left ventricular hypertrophy.  Left ventricular diastolic parameters  are consistent with Grade I diastolic dysfunction (impaired relaxation).  The average left ventricular global longitudinal strain is -16.4 %. The  global longitudinal strain is indeterminate.   2. Right ventricular systolic function is normal. The right ventricular  size is normal. There is normal pulmonary artery systolic pressure. The  estimated right ventricular systolic pressure is 23.2 mmHg.   3. Mild mitral valve regurgitation.   4. Possible PPM. AV max 2.65 m/s. DI 0.29. EOA index 0.41. AT <189ms.  Aortic valve regurgitation is not visualized. There is a 26 mm Sapien  prosthetic (TAVR) valve present in the aortic position. Procedure Date:  07/2020. Aortic valve area, by VTI  measures 0.82 cm. Aortic valve mean gradient measures 17.0 mmHg. Aortic  valve Vmax measures 2.65 m/s.   5. The inferior vena cava is normal in size with greater than 50%  respiratory variability, suggesting right atrial pressure of 3 mmHg.     Neuro/Psych  Headaches, neg  Seizures PSYCHIATRIC DISORDERS Anxiety      Neuromuscular disease    GI/Hepatic PUD,GERD  ,,(+) Hepatitis -  Endo/Other  diabetes, Type 2Hypothyroidism    Renal/GU      Musculoskeletal  (+) Arthritis ,  Fibromyalgia -  Abdominal   Peds  Hematology   Anesthesia Other Findings   Reproductive/Obstetrics                             Anesthesia Physical Anesthesia Plan  ASA: 3  Anesthesia Plan: MAC   Post-op Pain Management:    Induction: Intravenous  PONV Risk Score and Plan: 2 and Ondansetron  and Propofol  infusion  Airway Management Planned: Natural Airway  Additional Equipment:   Intra-op Plan:   Post-operative Plan:   Informed Consent: I have reviewed the patients History and Physical, chart, labs and discussed the procedure including the risks, benefits and alternatives for the proposed anesthesia with the patient or authorized representative who has indicated his/her understanding and acceptance.     Dental advisory given  Plan Discussed with: CRNA  Anesthesia Plan Comments:        Anesthesia Quick Evaluation

## 2023-12-03 NOTE — Anesthesia Postprocedure Evaluation (Signed)
 Anesthesia Post Note  Patient: MIRAYA CUDNEY  Procedure(s) Performed: EGD (ESOPHAGOGASTRODUODENOSCOPY) BIOPSY, SKIN, SUBCUTANEOUS TISSUE, OR MUCOUS MEMBRANE     Patient location during evaluation: PACU Anesthesia Type: MAC Level of consciousness: awake and alert Pain management: pain level controlled Vital Signs Assessment: post-procedure vital signs reviewed and stable Respiratory status: spontaneous breathing, nonlabored ventilation, respiratory function stable and patient connected to nasal cannula oxygen  Cardiovascular status: stable and blood pressure returned to baseline Postop Assessment: no apparent nausea or vomiting Anesthetic complications: no   No notable events documented.  Last Vitals:  Vitals:   12/03/23 1017 12/03/23 1120  BP: (!) 150/54 134/65  Pulse:  65  Resp: 14 17  Temp: 36.8 C 36.6 C  SpO2: 93% 100%    Last Pain:  Vitals:   12/03/23 1120  TempSrc: Temporal  PainSc: 0-No pain                 Leslye Rast

## 2023-12-03 NOTE — H&P (Signed)
 [facility] History and Physical   Primary Care Physician:  Roosvelt Colla, MD   Reason for Procedure:   Gastric ulcers  Plan:    EGD     HPI: Shannon Schultz is a 78 y.o. female  here for  EGD to assess for healing of gastric ulcers noted during endoscopy 07/20/23. Now on omeprazole  40mg  twice daily. Exam done at the hospital for anesthesia support given history of supplemental oxygen  use when sleeping, OSA. She does have cirrhosis, and also on Eliquis  for remote PE. Eliquis  not taken this AM, last taken yesterday. No obvious varices on the last exam.   Otherwise feels well without any cardiopulmonary symptoms today, feels at baseline.   I have discussed risks / benefits of anesthesia and endoscopic procedure with Dwight Givens and they wish to proceed with the exams as outlined today.    Past Medical History:  Diagnosis Date   Anxiety    Arthritis    "hands" (05/15/2017)   Bladder cancer (HCC) 2018   Breast cancer, right (HCC) 1992   DCIS,bladder ca (just dx)   Cirrhosis (HCC)    Colon polyp    Complication of anesthesia 1992   "local anesthesia" used was hard to awaken from-no problems since (05/15/2017)   COPD (chronic obstructive pulmonary disease) (HCC)    Coronary artery disease    COVID-19 virus infection 05/05/2019   Diverticulosis    Elevated liver enzymes    fatty liver per ultrasound per pt   FHx: BRCA2 gene positive    sister with BRCA2 mutation (pt tested NEGATIVE)   Fibromyalgia    Gastric ulcer    Heart murmur    faintly heard since TAVR 08-14-2020   HLD (hyperlipidemia)    HSV (herpes simplex virus) infection    on hip--on daily suppression   Hypertension    Hypothyroidism    took med 7 yrs after birth of 1st child   Impaired glucose tolerance    Migraine    Nonalcoholic steatohepatitis (NASH) 03/23/2022   On home oxygen  therapy    "have it available but I'm not using it" (05/15/2017)   Osteopenia    Personal history of breast cancer  11/15/2021   S/P TAVR (transcatheter aortic valve replacement) 08/14/2020   s/p TAVR with a 26 mm Edwards S3U via the left subclavian approach by Dr. Alva Jewels and Dr. Abel Hoe   Severe aortic stenosis    Severe obesity (BMI >= 40) (HCC) 10/06/2014   Sleep apnea    Sleeps with 2 L O2 @ night   Type 2 diabetes mellitus (HCC)    Urothelial cancer (HCC) 03/10/2017   Incidental bladder mass noted on CT, papillary urothelial CA on pathology from excision 12/2016; post-op instillation of chemo. Due for f/u cystoscopy 03/2017   Venous insufficiency 09/10/2018   Ventral hernia    Ventral hernia without obstruction or gangrene 05/15/2017   Vitamin D  deficiency disease     Past Surgical History:  Procedure Laterality Date   ABDOMINAL HERNIA REPAIR  05/15/2017   BIOPSY  07/20/2023   Procedure: BIOPSY;  Surgeon: Ace Holder, MD;  Location: [facility];  Service: Gastroenterology;;   BREAST BIOPSY Right 1992   BREAST BIOPSY Left 2018   BREAST EXCISIONAL BIOPSY Left 2018   ATYPICAL DUCTAL HYPERPLASIA INVOLVING A COMPLEX   BREAST LUMPECTOMY WITH RADIOACTIVE SEED LOCALIZATION Left 12/22/2016   Procedure: LEFT BREAST LUMPECTOMY WITH RADIOACTIVE SEED LOCALIZATION;  Surgeon: Caralyn Chandler, MD;  Location: [facility] OR;  Service:  General;  Laterality: Left;   CARDIAC CATHETERIZATION     CATARACT EXTRACTION, BILATERAL Bilateral R 03/16/2019 L 03/30/2019   Dr. Reyne Cave   COLONOSCOPY  2009, 08/2010, 12/2015   Dr. Tova Fresh; "only 1 had any polyps" (05/15/2017)   COLONOSCOPY WITH PROPOFOL  N/A 07/20/2023   Procedure: COLONOSCOPY WITH PROPOFOL ;  Surgeon: Ace Holder, MD;  Location: [facility];  Service: Gastroenterology;  Laterality: N/A;   CYSTOSCOPY  04/23/2017   CYSTOSCOPY W/ RETROGRADES Bilateral 01/12/2017   Procedure: CYSTOSCOPY WITH RETROGRADE PYELOGRAM/ EXAM UNDER ANESTHESIA;  Surgeon: Florencio Hunting, MD;  Location: [facility];  Service: Urology;  Laterality: Bilateral;   ENDOVENOUS ABLATION SAPHENOUS VEIN  W/ LASER Right 01/09/2022   endovenous laser ablation right greater saphenous vein by Angela Kell MD   ESOPHAGOGASTRODUODENOSCOPY (EGD) WITH PROPOFOL  N/A 07/20/2023   Procedure: ESOPHAGOGASTRODUODENOSCOPY (EGD) WITH PROPOFOL ;  Surgeon: Ace Holder, MD;  Location: [facility];  Service: Gastroenterology;  Laterality: N/A;   EYE SURGERY Bilateral    Cataracts removed   FEMUR IM NAIL Right 08/24/2019   Procedure: INTRAMEDULLARY (IM) NAIL FEMORAL;  Surgeon: Adonica Hoose, MD;  Location: [facility];  Service: Orthopedics;  Laterality: Right;   HERNIA REPAIR     INSERTION OF MESH N/A 05/15/2017   Procedure: INSERTION OF MESH;  Surgeon: Caralyn Chandler, MD;  Location: [facility] OR;  Service: General;  Laterality: N/A;   LAPAROSCOPIC CHOLECYSTECTOMY  2003   MASTECTOMY Right 1992   POLYPECTOMY  07/20/2023   Procedure: POLYPECTOMY;  Surgeon: Ace Holder, MD;  Location: [facility];  Service: Gastroenterology;;   RIGHT/LEFT HEART CATH AND CORONARY ANGIOGRAPHY N/A 07/05/2020   Procedure: RIGHT/LEFT HEART CATH AND CORONARY ANGIOGRAPHY;  Surgeon: Odie Benne, MD;  Location: [facility];  Service: Cardiovascular;  Laterality: N/A;   TEE WITHOUT CARDIOVERSION N/A 08/14/2020   Procedure: TRANSESOPHAGEAL ECHOCARDIOGRAM (TEE);  Surgeon: Odie Benne, MD;  Location: [facility] OR;  Service: Open Heart Surgery;  Laterality: N/A;   TRANSURETHRAL RESECTION OF BLADDER TUMOR WITH MITOMYCIN -C N/A 01/12/2017   Procedure: TRANSURETHRAL RESECTION OF BLADDER TUMOR WITH POSSIBLE POST OPERATIVE INSTILLATION OF MITOMYCIN -C;  Surgeon: Florencio Hunting, MD;  Location: [facility];  Service: Urology;  Laterality: N/A;   TYMPANOSTOMY TUBE PLACEMENT Bilateral 1980s   UMBILICAL HERNIA REPAIR  2003   VENTRAL HERNIA REPAIR N/A 05/15/2017   Procedure: VENTRAL HERNIA REPAIR WITH MESH;  Surgeon: Caralyn Chandler, MD;  Location: [facility] OR;  Service: General;  Laterality: N/A;    Prior to Admission medications    Medication Sig Start Date End Date Taking? Authorizing Provider  ACCU-CHEK AVIVA PLUS test strip TEST BLOOD SUGAR EVERY DAY 12/15/22   Roosvelt Colla, MD  acetaminophen  (TYLENOL ) 500 MG tablet Take 500-1,000 mg by mouth every 6 (six) hours as needed for moderate pain (pain score 4-6) or headache.    [provider]  albuterol  (PROVENTIL ) (2.5 MG/3ML) 0.083% nebulizer solution Take 3 mLs (2.5 mg total) by nebulization every 6 (six) hours as needed for wheezing or shortness of breath. 03/10/22   Quillian Brunt, MD  albuterol  (VENTOLIN  HFA) 108 [phone] Base) MCG/ACT inhaler Inhale 2 puffs into the lungs every 6 (six) hours as needed for wheezing or shortness of breath. 07/10/21   Quillian Brunt, MD  Alcohol  Swabs  (B-D SINGLE USE SWABS  REGULAR) PADS Use twice a day when checking blood sugars 12/21/19   Roosvelt Colla, MD  amoxicillin  (AMOXIL ) 500 MG capsule Take 2,000 mg by mouth See admin instructions. Take 2000 mg 1  hour prior to dental work 07/02/23   [provider]  apixaban  (ELIQUIS ) 2.5 MG TABS tablet Take 1 tablet (2.5 mg total) by mouth 2 (two) times daily. 07/23/23   Quillian Brunt, MD  bisoprolol -hydrochlorothiazide  (ZIAC ) 10-6.25 MG tablet TAKE 1 TABLET EVERY DAY 10/28/23   Roosvelt Colla, MD  cetirizine (ZYRTEC) 10 MG tablet Take 10 mg by mouth daily.    [provider]  clobetasol cream (TEMOVATE) 0.05 % Apply 1 Application topically 2 (two) times daily as needed (eczema). 04/05/23   [provider]  escitalopram  (LEXAPRO ) 10 MG tablet Take 1 tablet (10 mg total) by mouth daily. 09/03/23   Roosvelt Colla, MD  guaiFENesin  (MUCINEX ) 600 MG 12 hr tablet Take 600 mg by mouth at bedtime.    [provider]  hydrocortisone cream 1 % Apply 1 Application topically 2 (two) times daily as needed for itching.    [provider]  metFORMIN  (GLUCOPHAGE -XR) 500 MG 24 hr tablet TAKE 1 TABLET EVERY DAY WITH BREAKFAST 07/31/23   Roosvelt Colla, MD  montelukast  (SINGULAIR ) 10 MG  tablet TAKE 1 TABLET AT BEDTIME 11/26/23   Quillian Brunt, MD  Multiple Vitamins-Minerals (PRESERVISION AREDS 2 PO) Take 1 each by mouth 2 (two) times daily.    [provider]  omeprazole  (PRILOSEC) 40 MG capsule TAKE 1 CAPSULE TWICE DAILY 09/14/23   Collier, Amanda R, PA-C  Semaglutide , 1 MG/DOSE, (OZEMPIC , 1 MG/DOSE,) 4 MG/3ML SOPN Inject 1 mg into the skin every Friday.    [provider]  simvastatin  (ZOCOR ) 20 MG tablet TAKE 1 TABLET AT BEDTIME 10/28/23   Roosvelt Colla, MD  TRELEGY ELLIPTA  100-62.5-25 MCG/ACT AEPB INHALE 1 PUFF INTO THE LUNGS DAILY. 07/20/23   Quillian Brunt, MD  triamcinolone  cream (KENALOG ) 0.1 % Apply 1 Application topically 2 (two) times daily as needed (eczema/dry skin).    [provider]  TRUEplus Lancets 30G MISC TEST BLOOD SUGAR EVERY DAY 05/25/23   Roosvelt Colla, MD  valACYclovir  (VALTREX ) 500 MG tablet TAKE 1 TABLET EVERY DAY 10/28/23   Roosvelt Colla, MD    No current facility-administered medications for this encounter.    Allergies as of 10/22/2023 - Review Complete 10/22/2023  Allergen Reaction Noted   Contrast media [iodinated contrast media] Anaphylaxis, Shortness Of Breath, and Other (See Comments) 06/06/2011   Iohexol  Anaphylaxis, Shortness Of Breath, and Other (See Comments) 06/25/2009   Lisinopril Anaphylaxis, Shortness Of Breath, and Rash 05/15/2014   Sulfa antibiotics Anaphylaxis and Other (See Comments) 11/11/2010   Latex Other (See Comments) 06/25/2011   Codeine Nausea Only, Anxiety, and Other (See Comments) 11/11/2010   Levofloxacin Other (See Comments) 11/11/2010   Lipitor [atorvastatin calcium ] Rash 11/11/2010   Meloxicam Rash 03/29/2021   Nickel Other (See Comments) 07/06/2020   Vicodin [hydrocodone -acetaminophen ] Itching 08/05/2017    Family History  Problem Relation Age of Onset   Heart disease Mother        tachycardia   Heart disease Father    Diabetes Father    Hypertension Father    Asthma Sister     Allergies Sister    Hyperlipidemia Sister    Hashimoto's thyroiditis Sister    Fibromyalgia Sister    Skin cancer Sister        leg; dx after 51; surgery only   Arthritis Sister    Hypertension Sister    Kidney disease Sister    Varicose Veins Sister    Asthma Sister    Allergies Sister  Hyperlipidemia Sister    Hashimoto's thyroiditis Sister    Fibromyalgia Sister    Arthritis Sister    Breast cancer Sister 69       BRCA2 positive; metastatic in late 50s   Arthritis Brother    Stomach cancer Maternal Grandmother    Cancer Maternal Grandmother        deceased 66; unk. primary; possibly stomach   Tuberculosis Maternal Grandfather    Stroke Paternal Grandmother 13       died of cerebral hemorrhage   ADD / ADHD Son    Depression Son    Other Niece        BRCA2 positive; prophylatic mastectomy   Cancer Other        unknown GYN cancers; MGM's sisters   Colon cancer Neg Hx    Esophageal cancer Neg Hx    Pancreatic cancer Neg Hx     Social History   Socioeconomic History   Marital status: Married    Spouse name: Not on file   Number of children: 3   Years of education: Not on file   Highest education level: Not on file  Occupational History   Occupation: Retired  Tobacco Use   Smoking status: Former    Current packs/day: 0.00    Average packs/day: 1 pack/day for 46.0 years (46.0 ttl pk-yrs)    Types: Cigarettes    Start date: 05/30/1965    Quit date: 05/31/2011    Years since quitting: 12.5   Smokeless tobacco: Never  Vaping Use   Vaping status: Never Used  Substance and Sexual Activity   Alcohol  use: No   Drug use: No   Sexual activity: Yes    Partners: Male    Birth control/protection: Post-menopausal  Other Topics Concern   Not on file  Social History Narrative   Lives with her husband.  2 sons live in [place] nearby, 1 son moved to [place], [place] in 05/2023.   No pets. 6 grandchildren.         Updated 05/2023   Social Drivers of Health   Financial  Resource Strain: Low Risk  (11/20/2022)   Overall Financial Resource Strain (CARDIA)    Difficulty of Paying Living Expenses: Not very hard  Food Insecurity: No Food Insecurity (11/20/2022)   Hunger Vital Sign    Worried About Running Out of Food in the Last Year: Never true    Ran Out of Food in the Last Year: Never true  Transportation Needs: No Transportation Needs (11/20/2022)   PRAPARE - Administrator, Civil Service (Medical): No    Lack of Transportation (Non-Medical): No  Physical Activity: Not on file  Stress: No Stress Concern Present (11/20/2022)   [employer] of Occupational Health - Occupational Stress Questionnaire    Feeling of Stress : Only a little  Social Connections: Socially Integrated (11/20/2022)   Social Connection and Isolation Panel [NHANES]    Frequency of Communication with Friends and Family: Three times a week    Frequency of Social Gatherings with Friends and Family: Once a week    Attends Religious Services: More than 4 times per year    Active Member of [employer] or Organizations: No    Attends Engineer, structural: More than 4 times per year    Marital Status: Married  Catering manager Violence: Unknown (11/20/2022)   Humiliation, Afraid, Rape, and Kick questionnaire    Fear of Current or Ex-Partner: No    Emotionally Abused: No  Physically Abused: Not on file    Sexually Abused: Not on file    Review of Systems: All other review of systems negative except as mentioned in the HPI.  Physical Exam: Vital signs LMP  (LMP Unknown)   General:   Alert,  Well-developed, pleasant and cooperative in NAD Lungs:  Clear throughout to auscultation.   Heart:  Regular rate and rhythm Abdomen:  Soft, nontender and nondistended.   Neuro/Psych:  Alert and cooperative. Normal mood and affect. A and O x 3  Christi Coward, MD [facility]

## 2023-12-04 ENCOUNTER — Ambulatory Visit

## 2023-12-04 DIAGNOSIS — Z23 Encounter for immunization: Secondary | ICD-10-CM

## 2023-12-07 ENCOUNTER — Encounter (HOSPITAL_COMMUNITY): Payer: Self-pay | Admitting: Gastroenterology

## 2023-12-07 ENCOUNTER — Ambulatory Visit: Payer: Self-pay | Admitting: Gastroenterology

## 2023-12-08 DIAGNOSIS — Z961 Presence of intraocular lens: Secondary | ICD-10-CM | POA: Diagnosis not present

## 2023-12-08 DIAGNOSIS — H5212 Myopia, left eye: Secondary | ICD-10-CM | POA: Diagnosis not present

## 2023-12-08 DIAGNOSIS — H52223 Regular astigmatism, bilateral: Secondary | ICD-10-CM | POA: Diagnosis not present

## 2023-12-08 DIAGNOSIS — H0014 Chalazion left upper eyelid: Secondary | ICD-10-CM | POA: Diagnosis not present

## 2023-12-08 DIAGNOSIS — H3530 Unspecified macular degeneration: Secondary | ICD-10-CM | POA: Diagnosis not present

## 2023-12-08 DIAGNOSIS — H5201 Hypermetropia, right eye: Secondary | ICD-10-CM | POA: Diagnosis not present

## 2023-12-08 DIAGNOSIS — H524 Presbyopia: Secondary | ICD-10-CM | POA: Diagnosis not present

## 2023-12-08 LAB — SURGICAL PATHOLOGY

## 2023-12-11 ENCOUNTER — Other Ambulatory Visit: Payer: Self-pay

## 2023-12-11 MED ORDER — CARVEDILOL 3.125 MG PO TABS: 3.1250 mg | ORAL_TABLET | Freq: Two times a day (BID) | ORAL | 3 refills | Status: DC

## 2023-12-11 MED ORDER — HYDROCHLOROTHIAZIDE 12.5 MG PO TABS: 12.5000 mg | ORAL_TABLET | Freq: Every day | ORAL | 3 refills | Status: AC
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
## 2023-12-16 ENCOUNTER — Encounter: Payer: Self-pay | Admitting: Family Medicine

## 2023-12-16 ENCOUNTER — Ambulatory Visit: Admitting: Family Medicine

## 2023-12-16 VITALS — BP 126/64 | HR 72 | Ht 62.0 in | Wt 212.2 lb

## 2023-12-16 DIAGNOSIS — J439 Emphysema, unspecified: Secondary | ICD-10-CM

## 2023-12-16 DIAGNOSIS — J4489 Other specified chronic obstructive pulmonary disease: Secondary | ICD-10-CM | POA: Diagnosis not present

## 2023-12-16 DIAGNOSIS — I152 Hypertension secondary to endocrine disorders: Secondary | ICD-10-CM

## 2023-12-16 DIAGNOSIS — I8500 Esophageal varices without bleeding: Secondary | ICD-10-CM

## 2023-12-16 DIAGNOSIS — Z5181 Encounter for therapeutic drug level monitoring: Secondary | ICD-10-CM

## 2023-12-16 DIAGNOSIS — I5032 Chronic diastolic (congestive) heart failure: Secondary | ICD-10-CM

## 2023-12-16 DIAGNOSIS — B3781 Candidal esophagitis: Secondary | ICD-10-CM

## 2023-12-16 DIAGNOSIS — K449 Diaphragmatic hernia without obstruction or gangrene: Secondary | ICD-10-CM

## 2023-12-16 DIAGNOSIS — E1169 Type 2 diabetes mellitus with other specified complication: Secondary | ICD-10-CM

## 2023-12-16 DIAGNOSIS — E1159 Type 2 diabetes mellitus with other circulatory complications: Secondary | ICD-10-CM

## 2023-12-16 DIAGNOSIS — E785 Hyperlipidemia, unspecified: Secondary | ICD-10-CM

## 2023-12-16 DIAGNOSIS — F411 Generalized anxiety disorder: Secondary | ICD-10-CM | POA: Diagnosis not present

## 2023-12-16 DIAGNOSIS — Z86711 Personal history of pulmonary embolism: Secondary | ICD-10-CM

## 2023-12-16 DIAGNOSIS — K259 Gastric ulcer, unspecified as acute or chronic, without hemorrhage or perforation: Secondary | ICD-10-CM | POA: Diagnosis not present

## 2023-12-16 LAB — POCT GLYCOSYLATED HEMOGLOBIN (HGB A1C): Hemoglobin A1C: 5.2 % (ref 4.0–5.6)

## 2023-12-16 NOTE — Patient Instructions (Addendum)
 Your sugars remain well controlled.  You are continuing to lose weight on the 1 mg dose of Ozempic .  Let's try stopping the metformin  entirely. Let us  know if your sugars go up a lot (ie if consistently over 130).  Options then would be to increase the ozempic  dose to 2 mg, vs restart metformin .  Follow up in 3 months if ongoing issues with blood sugars, rather than waiting until your physical.

## 2023-12-16 NOTE — Telephone Encounter (Signed)
 PAP Ozempic  received, pt here & picked up

## 2023-12-17 ENCOUNTER — Ambulatory Visit
Admission: RE | Admit: 2023-12-17 | Discharge: 2023-12-17 | Disposition: A | Discharge status: Home / Self Care | Source: Ambulatory Visit | Attending: Family Medicine | Admitting: Family Medicine

## 2023-12-17 ENCOUNTER — Ambulatory Visit: Payer: Self-pay | Admitting: Family Medicine

## 2023-12-17 DIAGNOSIS — Z1231 Encounter for screening mammogram for malignant neoplasm of breast: Secondary | ICD-10-CM

## 2023-12-17 LAB — LIPID PANEL
Chol/HDL Ratio: 2.7 ratio (ref 0.0–4.4)
Cholesterol, Total: 139 mg/dL (ref 100–199)
HDL: 51 mg/dL (ref >39)
LDL Chol Calc (NIH): 74 mg/dL (ref 0–99)
Triglycerides: 70 mg/dL (ref 0–149)
VLDL Cholesterol Cal: 14 mg/dL (ref 5–40)

## 2023-12-17 LAB — HEPATIC FUNCTION PANEL
ALT: 11 IU/L (ref 0–32)
AST: 25 IU/L (ref 0–40)
Albumin: 4 g/dL (ref 3.8–4.8)
Alkaline Phosphatase: 90 IU/L (ref 44–121)
Bilirubin Total: 1 mg/dL (ref 0.0–1.2)
Bilirubin, Direct: 0.38 mg/dL (ref 0.00–0.40)
Total Protein: 6.1 g/dL (ref 6.0–8.5)

## 2023-12-17 LAB — MICROALBUMIN / CREATININE URINE RATIO
Creatinine, Urine: 131.8 mg/dL
Microalb/Creat Ratio: 39 mg/g{creat} — ABNORMAL HIGH (ref 0–29)
Microalbumin, Urine: 51.1 ug/mL

## 2023-12-17 NOTE — Progress Notes (Signed)
   12/17/2023  Patient ID: Shannon Schultz, female   DOB: [birth year], 78 y.o.   MRN: [id number]  Pharmacy Quality Measure Review  This patient is appearing on a report for being at risk of failing the adherence measure for diabetes medications this calendar year.   Medication: Metformin  Last fill date: 08/01/23 for 90 day supply  Medication has been stopped. No further action needed at this time.  Carnell Christian, PharmD Clinical Pharmacist [phone]

## 2023-12-24 ENCOUNTER — Ambulatory Visit: Admitting: Family Medicine

## 2023-12-30 ENCOUNTER — Telehealth: Payer: Self-pay

## 2023-12-30 ENCOUNTER — Telehealth: Payer: Self-pay | Admitting: *Deleted

## 2023-12-30 NOTE — Telephone Encounter (Signed)
-----   Message from Nurse Rock DEL sent at 12/11/2023  9:08 AM EDT ----- Regarding: OV Pt needs appt with Dr Leigh in Sept or Oct

## 2023-12-30 NOTE — Telephone Encounter (Signed)
 Pt scheduled to see Dr Leigh 03/02/24@11am , appt letter mailed to pt.

## 2023-12-30 NOTE — Telephone Encounter (Signed)
 Left message for patient to call back

## 2023-12-30 NOTE — Telephone Encounter (Signed)
 Patient advised. Of previous note.

## 2023-12-30 NOTE — Telephone Encounter (Signed)
-----   Message from Elspeth SHAUNNA Naval sent at 12/29/2023  6:44 PM EDT ----- Regarding: FW: switching from bisoprolol  to carvedilol ? Can someone please check on this patient to see if she is tolerating her current dose of Coreg ? If so can increase to 6.25mg  BID. Thanks ----- Message ----- From: Francyne Headland, MD Sent: 12/28/2023   6:16 PM EDT To: Annabelle Fetters, MD; Elspeth SHAUNNA Naval, MD Subject: RE: switching from bisoprolol  to carvedilol ?   Sorry it took so long to get back to you, I was on vacation for a couple of weeks. I see you have already started the transition to carvedilol . Carvedilol  is a better BP medication than bisoprolol , but 3.125 mg twice daily will likely be insufficient, so low threshold to increase to 6.25 mg twice daily unless she has bronchospasm issues. Mihai ----- Message ----- From: Fetters Annabelle, MD Sent: 12/09/2023   5:04 PM EDT To: Headland Francyne, MD Subject: switching from bisoprolol  to carvedilol ?       Dr. JAYSON (or covering providers)--  Dr. Naval (GI) messaged me last week after doing EGD on our mutual patient and reported--  She has a history of ulcers and was relooking at those, but I am concerned she has developed some esophageal varices from her cirrhosis. I previously didn't think she had varices in January during her initial exam, but today's findings were more concerning for that.   I know she has COPD and on oxygen . Do you think she is a candidate for nonselective beta blocker such as nadolol or Coreg ? I see she is on bisoprolol  / hydrochlorothiazide , wondering if we could switch to Coreg ? That would help treat her varices. She is on Eliquis  and concerned about her bleeding risk if we don't treat. Thanks:   I reached out to pulmonary, and Dr. Jude said OK to trial low dose coreg  if warranted & monitor breathing status.  I'm reaching out to you, her cardiologist, as her current bisoprolol  dose is 10 mg. If they want us  to start low dose carvedilol , we  may need to make adjustments to her other BP meds (she is on combo with hydrochlorothiazide , and ? If lower dose will keep her BP adequately controlled).  I thought I'd reach out for your input as well, or let you make the change and f/u as indicated.  Thanks.  Please keep me (and Dr. Naval) in the loop with the plan.  Eve

## 2023-12-31 MED ORDER — CARVEDILOL 6.25 MG PO TABS: 6.2500 mg | ORAL_TABLET | Freq: Two times a day (BID) | ORAL | 0 refills | Status: DC

## 2023-12-31 NOTE — Telephone Encounter (Signed)
 Patient states that she has done well on carvedilol  3.125 mg twice daily with no side effects at all. Patient is advised that we would like her to increase carvedilol  dosage to 6.25 mg twice daily to maintain effective therapeutic range. Patient verbalizes understanding. Rx sent to [facility] for carvedilol  6.25 mg twice daily dosing.

## 2024-01-09 ENCOUNTER — Other Ambulatory Visit: Payer: Self-pay | Admitting: Family Medicine

## 2024-01-09 DIAGNOSIS — E7800 Pure hypercholesterolemia, unspecified: Secondary | ICD-10-CM

## 2024-01-27 ENCOUNTER — Telehealth: Payer: Self-pay | Admitting: Hematology and Oncology

## 2024-01-27 NOTE — Telephone Encounter (Signed)
 Varina re-scheduled due to her husband having surgery on 9/24. She will see Dr. Federico on 10/6.

## 2024-01-28 ENCOUNTER — Other Ambulatory Visit: Payer: Self-pay | Admitting: Family Medicine

## 2024-01-28 DIAGNOSIS — F411 Generalized anxiety disorder: Secondary | ICD-10-CM

## 2024-02-10 ENCOUNTER — Ambulatory Visit: Payer: Self-pay | Admitting: Cardiology

## 2024-02-10 ENCOUNTER — Ambulatory Visit (HOSPITAL_COMMUNITY)
Admission: RE | Admit: 2024-02-10 | Discharge: 2024-02-10 | Disposition: A | Discharge status: Home / Self Care | Source: Ambulatory Visit | Attending: Cardiology | Admitting: Cardiology

## 2024-02-10 DIAGNOSIS — Z952 Presence of prosthetic heart valve: Secondary | ICD-10-CM

## 2024-02-10 DIAGNOSIS — I350 Nonrheumatic aortic (valve) stenosis: Secondary | ICD-10-CM

## 2024-02-10 LAB — ECHOCARDIOGRAM LIMITED
AR max vel: 1.96 cm2
AV Area VTI: 1.85 cm2
AV Area mean vel: 1.98 cm2
AV Mean grad: 19.5 mmHg
AV Peak grad: 35.5 mmHg
Ao pk vel: 2.98 m/s
Area-P 1/2: 3.21 cm2
S' Lateral: 3.7 cm

## 2024-02-11 ENCOUNTER — Other Ambulatory Visit (HOSPITAL_BASED_OUTPATIENT_CLINIC_OR_DEPARTMENT_OTHER): Payer: Self-pay | Admitting: Pulmonary Disease

## 2024-02-16 DIAGNOSIS — S92354A Nondisplaced fracture of fifth metatarsal bone, right foot, initial encounter for closed fracture: Secondary | ICD-10-CM | POA: Diagnosis not present

## 2024-02-22 ENCOUNTER — Telehealth: Payer: Self-pay | Admitting: *Deleted

## 2024-02-22 NOTE — Telephone Encounter (Signed)
 Copied from CRM #[id number]. Topic: Clinical - Medication Question >> Feb 22, 2024  3:58 PM Myrick T wrote: Reason for CRM: patient called stated she broke her foot and has been taking Extra Strength Tylenol  500mg  for the pain but she realized it is not good for her liver as she has liver disease. She take 4 per day but want to know what else she can take that will not harm her liver.   Please advise.

## 2024-02-23 ENCOUNTER — Encounter: Payer: Self-pay | Admitting: Acute Care

## 2024-02-23 NOTE — Telephone Encounter (Signed)
 Patient advised.

## 2024-03-01 ENCOUNTER — Telehealth: Payer: Self-pay

## 2024-03-01 NOTE — Telephone Encounter (Signed)
 Received a reorder form from [employer] on Ozempic ,fill and faxed it to provider office to sign and date. Can be fax to [employer] or fax back to [phone].

## 2024-03-02 ENCOUNTER — Encounter: Payer: Self-pay | Admitting: Gastroenterology

## 2024-03-02 ENCOUNTER — Ambulatory Visit: Admitting: Gastroenterology

## 2024-03-02 VITALS — BP 118/72 | HR 74 | Ht 63.0 in | Wt 212.0 lb

## 2024-03-02 DIAGNOSIS — K7460 Unspecified cirrhosis of liver: Secondary | ICD-10-CM | POA: Diagnosis not present

## 2024-03-02 DIAGNOSIS — Z8711 Personal history of peptic ulcer disease: Secondary | ICD-10-CM

## 2024-03-02 DIAGNOSIS — I8500 Esophageal varices without bleeding: Secondary | ICD-10-CM

## 2024-03-02 DIAGNOSIS — Z860100 Personal history of colon polyps, unspecified: Secondary | ICD-10-CM | POA: Diagnosis not present

## 2024-03-02 NOTE — Patient Instructions (Signed)
 Your provider has requested that you go to the basement level for lab work before leaving today. Press B on the elevator. The lab is located at the first door on the left as you exit the elevator.  Due to recent changes in healthcare laws, you may see the results of your imaging and laboratory studies on MyChart before your provider has had a chance to review them.  We understand that in some cases there may be results that are confusing or concerning to you. Not all laboratory results come back in the same time frame and the provider may be waiting for multiple results in order to interpret others.  Please give us  48 hours in order for your provider to thoroughly review all the results before contacting the office for clarification of your results.    You have been scheduled for an abdominal ultrasound at [facility] (1st floor of hospital) on Wednesday, 04/20/24 at 10:30 am. Please arrive 15 minutes prior to your appointment for registration. Make certain not to have anything to eat or drink 6 hours prior to your appointment. Should you need to reschedule your appointment, please contact radiology at [phone]. This test typically takes about 30 minutes to perform.  Continue Coreg .  Continue Omeprazole .  Follow up in 6 months.  Thank you for entrusting me with your care and for choosing [facility], Dr. Elspeth Naval   _______________________________________________________  If your blood pressure at your visit was 140/90 or greater, please contact your primary care physician to follow up on this.  _______________________________________________________  If you are age 78 or older, your body mass index should be between 23-30. Your Body mass index is 37.55 kg/m. If this is out of the aforementioned range listed, please consider follow up with your Primary Care Provider.  If you are age 3 or younger, your body mass index should be between 19-25. Your Body mass  index is 37.55 kg/m. If this is out of the aformentioned range listed, please consider follow up with your Primary Care Provider.   ________________________________________________________  The [facility] providers would like to encourage you to use MYCHART to communicate with providers for non-urgent requests or questions.  Due to long hold times on the telephone, sending your provider a message by [address] [facility] may be a faster and more efficient way to get a response.  Please allow 48 business hours for a response.  Please remember that this is for non-urgent requests.  _______________________________________________________  [facility] is using a team-based approach to care.  Your team is made up of your doctor and two to three APPS. Our APPS (Nurse Practitioners and Physician Assistants) work with your physician to ensure care continuity for you. They are fully qualified to address your health concerns and develop a treatment plan. They communicate directly with your gastroenterologist to care for you. Seeing the Advanced Practice Practitioners on your physician's team can help you by facilitating care more promptly, often allowing for earlier appointments, access to diagnostic testing, procedures, and other specialty referrals.

## 2024-03-02 NOTE — Progress Notes (Signed)
 HPI :  78 y/o female here for reassessment - history of cirrhosis, gastric ulcer. Also s/p TAVR 2022, COPD on oxygen  2 L at night, Eliquis  for PE dx 2023, history of bladder CA 2018 and R breast cancer 1992.    Cirrhosis history - diagnosed with this years ago, unclear chronicity.  Reportedly due to [facility].  Had a serologic workup this past April which showed no other concerning findings as to cause.  Do not see that iron panel has been sent otherwise her workup has been complete.  No decompensations to date.  She had an EGD with me in June as a follow-up to show if she has had healing of interval ulcers.  She was thought to have a vascular blab in her upper esophagus back in January, on upon repeat evaluation I thought there was extension further distally to this and now suspect she probably has varices.  She does have COPD, I spoke with her PCP after the last endoscopy and started her on Coreg  6.125 mg twice daily.  She has been keeping her blood pressure log which I reviewed, her blood pressure is normally 120s over 80s.  Her heart rate has tolerated the Coreg , she denies any worsening of COPD, she continues to wear oxygen  at night with her sleep apnea.  Recall she is also on Eliquis  for a PE back in 2023.  She has mild thrombocytopenia.  So far doing well on the Coreg .  Recall she also had esophageal candidiasis treated with fluconazole .  She broke her foot recently, has been using mostly Tylenol  for pain, has not used NSAIDs.  Limits her Tylenol  use to 2 g/day.  She had right upper quadrant ultrasound for HCC screening and April, due for repeat ultrasound in October.  Otherwise on EGD her previously noted ulcers had healed.  She does have gastritis, biopsies negative for H. pylori.  She had some adherent plaques in her stomach I thought from perhaps coating from a pill etc. to cause that appearance. She reports that she does not drink alcohol  and does not use drugs.  She has remained on omeprazole   and tolerates it well.  No upper tract symptoms currently.       Endoscopic history: EGD 07/20/23: Esophagogastric landmarks were identified: the Z-line was found at 40 cm, the gastroesophageal junction was found at 40 cm and the upper extent of the gastric folds was found at 41 cm from the incisors. Findings: A 1 cm hiatal hernia was present. There was a suspected isolated vascular bleb in the mid esophagus, there did not appear to be any varices associated with this. No stigmata for bleeding noted. The exam of the esophagus was otherwise normal. Two non-bleeding superficial gastric ulcers with no stigmata of bleeding were found in the gastric fundus (5mm) and in the gastric body (3mm). The largest lesion was 5 mm in largest dimension. There was some nodular mucosa around the smaller ulcer. Biopsies were taken from this area with a cold forceps for histology. The exam of the stomach was otherwise normal. No gastric varices. Biopsies were taken with a cold forceps for Helicobacter pylori testing. The examined duodenum was normal.   Colonoscopy 07/20/23: - One 5 mm polyp in the ascending colon, removed with a cold snare. Resected and retrieved. - A few colonic angiodysplastic lesions. - One 4 mm polyp at the hepatic flexure, removed with a cold snare. Resected and retrieved. - Six 3 to 6 mm polyps in the transverse colon, removed with  a cold snare. Resected and retrieved. - One 5 mm polyp in the descending colon, removed with a cold snare. Resected and retrieved. - One 3 mm polyp in the sigmoid colon, removed with a cold snare. Resected and retrieved. - Diverticulosis in the sigmoid colon. - Internal hemorrhoids. - The examination was otherwise normal.     FINAL MICROSCOPIC DIAGNOSIS:   A. GASTRIC ULCER, BODY, BIOPSY:  - Gastric antral mucosa with focal nonspecific acute gastritis and  reactive changes  - Negative for dysplasia or malignancy   B. GASTRIC, BIOPSY:  - Gastric antral mucosa with  nonspecific reactive gastropathy  - Gastric oxyntic mucosa with no specific histopathologic changes  - Helicobacter pylori-like organisms are not identified on routine HE  stain   C. COLON, ASCENDING, HEPATIC FLEXTURE, POLYPECTOMY:  - Tubular adenoma(s) without high-grade dysplasia or malignancy   D. COLON, TRANSVERSE, DESCENDING, SIGMOID, POLYPECTOMY:  - Tubular adenoma(s) without high-grade dysplasia or malignancy  - Hyperplastic polyp        Echo 08/12/23: IMPRESSIONS   1. Left ventricular ejection fraction, by estimation, is 55 to 60%. The  left ventricle has normal function. The left ventricle has no regional  wall motion abnormalities. There is mild left ventricular hypertrophy.  Left ventricular diastolic parameters  are consistent with Grade I diastolic dysfunction (impaired relaxation).  The average left ventricular global longitudinal strain is -16.4 %. The  global longitudinal strain is indeterminate.   2. Right ventricular systolic function is normal. The right ventricular  size is normal. There is normal pulmonary artery systolic pressure. The  estimated right ventricular systolic pressure is 23.2 mmHg.   3. Mild mitral valve regurgitation.   4. Possible PPM. AV max 2.65 m/s. DI 0.29. EOA index 0.41. AT <152ms.  Aortic valve regurgitation is not visualized. There is a 26 mm Sapien  prosthetic (TAVR) valve present in the aortic position. Procedure Date:  07/2020. Aortic valve area, by VTI  measures 0.82 cm. Aortic valve mean gradient measures 17.0 mmHg. Aortic  valve Vmax measures 2.65 m/s.   5. The inferior vena cava is normal in size with greater than 50%  respiratory variability, suggesting right atrial pressure of 3 mmHg.   EGD 12/03/23: Esophagogastric landmarks were identified: the Z-line was found at 40 cm, the gastroesophageal junction was found at 40 cm and the upper extent of the gastric folds was found at 42 cm from the incisors. Findings: A 2 cm hiatal  hernia was present. Mid esophagus around 30cm from the incisors there was focal esophagel varices (Grade II) vs. vascular bleb. I previously had thought this more so a vascular bleb on the last exam given there was no continuity proximally or distally, however small column of varices were suspected slightly distal and proximal to this, favor esophagel varix at this time. No high risk stigmata for bleeding noted. Patchy, white plaques were found in the lower third of the esophagus, grossly consistent with esophagel candidiasis. The exam of the esophagus was otherwise normal.Mucosal changes were found in the gastric body. The previously noted ulcer that had healed and some nodular mucosa with focal erosion is noted in the mid gastric body. Biopsies were taken with a cold forceps for histology. Mucosa was friable with oozing. There appeared to be adherent white plaques more proximal to the nodular / erosion area, in a few areas. Despite extensive lavage could not clear the underlying mucosa - possible this represents an ulcer but seemed more so an adherent plaque or perhaps related to  retained pill coating? Peripheral biopsies obtained in these areas. The exam of the stomach was otherwise normal. Interval healing of the fundus ulcer. No gastric varices. The examined duodenum was normal.  - Patient has a contact number available for emergencies. The signs and symptoms of potential delayed complications were discussed with the patient. Return to normal activities tomorrow. Written discharge instructions were provided to the patient. - Resume previous diet. - Continue present medications. - Continue omeprazole  twice daily for now given findings - Await pathology results. - Start fluconazole  if no medication interactions / allergies for esophageal candidiasis - Will need to reduce Zocor  dose while on fluconazole  (take every other day or once every 3 days while on fluconazole ) - Will discuss with patient's primary care if  she is a candidate for nonselective beta blockade (COPD?) to treat varices - appears she is already on bisoprolol . - Resume Eliquis  tomorrow given biopsies today and friable mucosa  FINAL MICROSCOPIC DIAGNOSIS:   A. STOMACH, BODY, BIOPSY:  - Mildly edematous antral-type gastric mucosa with reactive foveolar  hyperplasia, compatible with area of healing ulceration.  - Negative for H. pylori, intestinal metaplasia, dysplasia, and  malignancy.   B. STOMACH, BODY, BIOPSY:  - Superficial sampling of mildly edematous gastric mucosa with reactive  foveolar hyperplasia.  - Fragment of poorly preserved oxyntic gastric mucosa with acute  gastritis; IHC for H. pylori will be reported in an addendum.  - Negative for intestinal metaplasia, dysplasia, and malignancy.   RUQ US  10/23/23: IMPRESSION: 1. No acute abnormality identified. 2. Cirrhotic morphology of liver.     Past Medical History:  Diagnosis Date   Anxiety    Arthritis    hands (05/15/2017)   Bladder cancer (HCC) 2018   Breast cancer, right (HCC) 1992   DCIS,bladder ca (just dx)   Cirrhosis (HCC)    Colon polyp    Complication of anesthesia 1992   local anesthesia used was hard to awaken from-no problems since (05/15/2017)   COPD (chronic obstructive pulmonary disease) (HCC)    Coronary artery disease    COVID-19 virus infection 05/05/2019   Diverticulosis    Elevated liver enzymes    fatty liver per ultrasound per pt   FHx: BRCA2 gene positive    sister with BRCA2 mutation (pt tested NEGATIVE)   Fibromyalgia    Gastric ulcer    Heart murmur    faintly heard since TAVR 08-14-2020   HLD (hyperlipidemia)    HSV (herpes simplex virus) infection    on hip--on daily suppression   Hypertension    Hypothyroidism    took med 7 yrs after birth of 1st child   Impaired glucose tolerance    Migraine    Nonalcoholic steatohepatitis (NASH) 03/23/2022   On home oxygen  therapy    have it available but I'm not using it  (05/15/2017)   Osteopenia    Personal history of breast cancer 11/15/2021   S/P TAVR (transcatheter aortic valve replacement) 08/14/2020   s/p TAVR with a 26 mm Edwards S3U via the left subclavian approach by Dr. Dusty and Dr. Verlin   Severe aortic stenosis    Severe obesity (BMI >= 40) (HCC) 10/06/2014   Sleep apnea    Sleeps with 2 L O2 @ night   Type 2 diabetes mellitus (HCC)    Urothelial cancer (HCC) 03/10/2017   Incidental bladder mass noted on CT, papillary urothelial CA on pathology from excision 12/2016; post-op instillation of chemo. Due for f/u cystoscopy 03/2017   Venous  insufficiency 09/10/2018   Ventral hernia    Ventral hernia without obstruction or gangrene 05/15/2017   Vitamin D  deficiency disease      Past Surgical History:  Procedure Laterality Date   ABDOMINAL HERNIA REPAIR  05/15/2017   BIOPSY  07/20/2023   Procedure: BIOPSY;  Surgeon: Leigh Elspeth SQUIBB, MD;  Location: [facility];  Service: Gastroenterology;;   BIOPSY OF SKIN SUBCUTANEOUS TISSUE AND/OR MUCOUS MEMBRANE  12/03/2023   Procedure: BIOPSY, SKIN, SUBCUTANEOUS TISSUE, OR MUCOUS MEMBRANE;  Surgeon: Leigh Elspeth SQUIBB, MD;  Location: [facility];  Service: Gastroenterology;;   BREAST BIOPSY Right 1992   BREAST BIOPSY Left 2018   BREAST EXCISIONAL BIOPSY Left 2018   ATYPICAL DUCTAL HYPERPLASIA INVOLVING A COMPLEX   BREAST LUMPECTOMY WITH RADIOACTIVE SEED LOCALIZATION Left 12/22/2016   Procedure: LEFT BREAST LUMPECTOMY WITH RADIOACTIVE SEED LOCALIZATION;  Surgeon: Curvin Deward MOULD, MD;  Location: [facility] OR;  Service: General;  Laterality: Left;   CARDIAC CATHETERIZATION     CATARACT EXTRACTION, BILATERAL Bilateral R 03/16/2019 L 03/30/2019   Dr. Lelon   COLONOSCOPY  2009, 08/2010, 12/2015   Dr. Kristie; only 1 had any polyps (05/15/2017)   COLONOSCOPY WITH PROPOFOL  N/A 07/20/2023   Procedure: COLONOSCOPY WITH PROPOFOL ;  Surgeon: Leigh Elspeth SQUIBB, MD;  Location: [facility];  Service: Gastroenterology;   Laterality: N/A;   CYSTOSCOPY  04/23/2017   CYSTOSCOPY W/ RETROGRADES Bilateral 01/12/2017   Procedure: CYSTOSCOPY WITH RETROGRADE PYELOGRAM/ EXAM UNDER ANESTHESIA;  Surgeon: Renda Glance, MD;  Location: [facility];  Service: Urology;  Laterality: Bilateral;   ENDOVENOUS ABLATION SAPHENOUS VEIN W/ LASER Right 01/09/2022   endovenous laser ablation right greater saphenous vein by Penne Colorado MD   ESOPHAGOGASTRODUODENOSCOPY N/A 12/03/2023   Procedure: EGD (ESOPHAGOGASTRODUODENOSCOPY);  Surgeon: Leigh Elspeth SQUIBB, MD;  Location: [facility];  Service: Gastroenterology;  Laterality: N/A;   ESOPHAGOGASTRODUODENOSCOPY (EGD) WITH PROPOFOL  N/A 07/20/2023   Procedure: ESOPHAGOGASTRODUODENOSCOPY (EGD) WITH PROPOFOL ;  Surgeon: Leigh Elspeth SQUIBB, MD;  Location: [facility];  Service: Gastroenterology;  Laterality: N/A;   EYE SURGERY Bilateral    Cataracts removed   FEMUR IM NAIL Right 08/24/2019   Procedure: INTRAMEDULLARY (IM) NAIL FEMORAL;  Surgeon: Fidel Rogue, MD;  Location: [facility];  Service: Orthopedics;  Laterality: Right;   HERNIA REPAIR     INSERTION OF MESH N/A 05/15/2017   Procedure: INSERTION OF MESH;  Surgeon: Curvin Deward MOULD, MD;  Location: [facility] OR;  Service: General;  Laterality: N/A;   LAPAROSCOPIC CHOLECYSTECTOMY  2003   MASTECTOMY Right 1992   POLYPECTOMY  07/20/2023   Procedure: POLYPECTOMY;  Surgeon: Leigh Elspeth SQUIBB, MD;  Location: [facility];  Service: Gastroenterology;;   RIGHT/LEFT HEART CATH AND CORONARY ANGIOGRAPHY N/A 07/05/2020   Procedure: RIGHT/LEFT HEART CATH AND CORONARY ANGIOGRAPHY;  Surgeon: Verlin Lonni BIRCH, MD;  Location: [facility];  Service: Cardiovascular;  Laterality: N/A;   TEE WITHOUT CARDIOVERSION N/A 08/14/2020   Procedure: TRANSESOPHAGEAL ECHOCARDIOGRAM (TEE);  Surgeon: Verlin Lonni BIRCH, MD;  Location: [facility] OR;  Service: Open Heart Surgery;  Laterality: N/A;   TRANSURETHRAL RESECTION OF BLADDER TUMOR WITH MITOMYCIN -C N/A  01/12/2017   Procedure: TRANSURETHRAL RESECTION OF BLADDER TUMOR WITH POSSIBLE POST OPERATIVE INSTILLATION OF MITOMYCIN -C;  Surgeon: Renda Glance, MD;  Location: [facility];  Service: Urology;  Laterality: N/A;   TYMPANOSTOMY TUBE PLACEMENT Bilateral 1980s   UMBILICAL HERNIA REPAIR  2003   VENTRAL HERNIA REPAIR N/A 05/15/2017   Procedure: VENTRAL HERNIA REPAIR WITH MESH;  Surgeon: Curvin Deward MOULD, MD;  Location: [facility]  OR;  Service: General;  Laterality: N/A;   Family History  Problem Relation Age of Onset   Heart disease Mother        tachycardia   Heart disease Father    Diabetes Father    Hypertension Father    Asthma Sister    Allergies Sister    Hyperlipidemia Sister    Hashimoto's thyroiditis Sister    Fibromyalgia Sister    Skin cancer Sister        leg; dx after 81; surgery only   Arthritis Sister    Hypertension Sister    Kidney disease Sister    Varicose Veins Sister    Asthma Sister    Allergies Sister    Hyperlipidemia Sister    Hashimoto's thyroiditis Sister    Fibromyalgia Sister    Arthritis Sister    Breast cancer Sister 33       BRCA2 positive; metastatic in late 53s   Arthritis Brother    Stomach cancer Maternal Grandmother    Cancer Maternal Grandmother        deceased 62; unk. primary; possibly stomach   Tuberculosis Maternal Grandfather    Stroke Paternal Grandmother 23       died of cerebral hemorrhage   ADD / ADHD Son    Depression Son    Other Niece        BRCA2 positive; prophylatic mastectomy   Cancer Other        unknown GYN cancers; MGM's sisters   Colon cancer Neg Hx    Esophageal cancer Neg Hx    Pancreatic cancer Neg Hx    Social History   Tobacco Use   Smoking status: Former    Current packs/day: 0.00    Average packs/day: 1 pack/day for 46.0 years (46.0 ttl pk-yrs)    Types: Cigarettes    Start date: 05/30/1965    Quit date: 05/31/2011    Years since quitting: 12.7   Smokeless tobacco: Never  Vaping Use   Vaping status: Never Used   Substance Use Topics   Alcohol  use: No   Drug use: No   Current Outpatient Medications  Medication Sig Dispense Refill   ACCU-CHEK AVIVA PLUS test strip TEST BLOOD SUGAR EVERY DAY 100 strip 3   acetaminophen  (TYLENOL ) 500 MG tablet Take 500-1,000 mg by mouth every 6 (six) hours as needed for moderate pain (pain score 4-6) or headache.     albuterol  (PROVENTIL ) (2.5 MG/3ML) 0.083% nebulizer solution Take 3 mLs (2.5 mg total) by nebulization every 6 (six) hours as needed for wheezing or shortness of breath. 75 mL 12   albuterol  (VENTOLIN  HFA) 108 (90 Base) MCG/ACT inhaler Inhale 2 puffs into the lungs every 6 (six) hours as needed for wheezing or shortness of breath. 3 each 3   Alcohol  Swabs  (B-D SINGLE USE SWABS  REGULAR) PADS Use twice a day when checking blood sugars 100 each 1   amoxicillin  (AMOXIL ) 500 MG capsule Take 2,000 mg by mouth See admin instructions. Take 2000 mg 1 hour prior to dental work     apixaban  (ELIQUIS ) 2.5 MG TABS tablet TAKE 1 TABLET(2.5 MG) BY MOUTH TWICE DAILY 60 tablet 0   carvedilol  (COREG ) 6.25 MG tablet Take 1 tablet (6.25 mg total) by mouth 2 (two) times daily with a meal. 180 tablet 0   cetirizine (ZYRTEC) 10 MG tablet Take 10 mg by mouth daily.     clobetasol cream (TEMOVATE) 0.05 % Apply 1 Application topically 2 (two) times daily as  needed (eczema).     escitalopram  (LEXAPRO ) 10 MG tablet TAKE 1 TABLET EVERY DAY 90 tablet 0   guaiFENesin  (MUCINEX ) 600 MG 12 hr tablet Take 600 mg by mouth at bedtime.     hydrochlorothiazide  (HYDRODIURIL ) 12.5 MG tablet Take 1 tablet (12.5 mg total) by mouth daily. 90 tablet 3   hydrocortisone cream 1 % Apply 1 Application topically 2 (two) times daily as needed for itching.     montelukast  (SINGULAIR ) 10 MG tablet TAKE 1 TABLET AT BEDTIME 90 tablet 3   Multiple Vitamins-Minerals (PRESERVISION AREDS 2 PO) Take 1 each by mouth 2 (two) times daily.     omeprazole  (PRILOSEC) 40 MG capsule TAKE 1 CAPSULE TWICE DAILY 180 capsule 3    Semaglutide , 1 MG/DOSE, (OZEMPIC , 1 MG/DOSE,) 4 MG/3ML SOPN Inject 1 mg into the skin every Friday.     simvastatin  (ZOCOR ) 20 MG tablet TAKE 1 TABLET AT BEDTIME 90 tablet 1   TRELEGY ELLIPTA  100-62.5-25 MCG/ACT AEPB INHALE 1 PUFF INTO THE LUNGS DAILY. 180 each 3   triamcinolone  cream (KENALOG ) 0.1 % Apply 1 Application topically 2 (two) times daily as needed (eczema/dry skin).     TRUEplus Lancets 30G MISC TEST BLOOD SUGAR EVERY DAY 100 each 3   valACYclovir  (VALTREX ) 500 MG tablet TAKE 1 TABLET EVERY DAY 90 tablet 2   No current facility-administered medications for this visit.   Allergies  Allergen Reactions   Contrast Media [Iodinated Contrast Media] Anaphylaxis, Shortness Of Breath and Other (See Comments)    Could not breath   Iohexol  Anaphylaxis, Shortness Of Breath and Other (See Comments)    Immediately could not breathe   Lisinopril Anaphylaxis, Shortness Of Breath and Rash   Sulfa Antibiotics Anaphylaxis and Other (See Comments)    Historical from mother, pt states that mother says she almost died from this drug   Latex Other (See Comments)    Unless against on skin for a long time, blisters. Short term is okay.   Codeine Nausea Only, Anxiety and Other (See Comments)    insomnia   Levofloxacin Other (See Comments)    insomnia   Lipitor [Atorvastatin Calcium ] Rash   Meloxicam Rash    Broke out in a rash on her stomach,back, legs, and behind ears.   Nickel Other (See Comments)    With earrings pt has soreness and drainage from piercing   Vicodin [Hydrocodone -Acetaminophen ] Itching     Review of Systems: All systems reviewed and negative except where noted in HPI.    ECHOCARDIOGRAM LIMITED Result Date: 02/10/2024    ECHOCARDIOGRAM LIMITED REPORT   Patient Name:   Shannon Schultz Date of Exam: 02/10/2024 Medical Rec #:  [id number]     Height:       62.0 in Accession #:    [id number]    Weight:       212.2 lb Date of Birth:  07-31-1945    BSA:          1.960 m Patient Age:     77 years      BP:           126/69 mmHg Patient Gender: F             HR:           81 bpm. Exam Location:  [facility] Procedure: Limited Echo, Limited Color Doppler and Cardiac Doppler (Both            Spectral and Color Flow Doppler were utilized during  procedure). Indications:    Aortic Valve Disorder I35.9; S/p TAVR  History:        Patient has prior history of Echocardiogram examinations, most                 recent 08/12/2023. CAD, COPD; Risk Factors:Dyslipidemia and                 Diabetes.                 Aortic Valve: 26 mm Edwards Sapien prosthetic, stented (TAVR)                 valve is present in the aortic position. Procedure Date:                 08/14/2020.  Sonographer:    Augustin Seals RDCS Referring Phys: [phone] MIHAI CROITORU IMPRESSIONS  1. Left ventricular ejection fraction, by estimation, is 55 to 60%. The left ventricle has normal function. The left ventricle has no regional wall motion abnormalities.  2. Right ventricular systolic function is normal. The right ventricular size is normal.  3. Mild mitral valve regurgitation.  4. The aortic valve has been repaired/replaced. Mild aortic valve stenosis. There is a 26 mm Edwards Sapien prosthetic (TAVR) valve present in the aortic position. Procedure Date: 08/14/2020. Aortic valve mean gradient measures 19.5 mmHg. Aortic valve Vmax measures 2.98 m/s. FINDINGS  Left Ventricle: Left ventricular ejection fraction, by estimation, is 55 to 60%. The left ventricle has normal function. The left ventricle has no regional wall motion abnormalities. There is no left ventricular hypertrophy. Right Ventricle: The right ventricular size is normal. No increase in right ventricular wall thickness. Right ventricular systolic function is normal. Pericardium: There is no evidence of pericardial effusion. Mitral Valve: Mild mitral annular calcification. Mild mitral valve regurgitation. Aortic Valve: Acceleration time 94ms, mild increase in gradients since  prior echo. DVI only 0.52. Initial Vmax 2.08, last 2.65. The aortic valve has been repaired/replaced. Mild aortic stenosis is present. Aortic valve mean gradient measures 19.5 mmHg. Aortic valve peak gradient measures 35.5 mmHg. Aortic valve area, by VTI measures 1.85 cm. There is a 26 mm Edwards Sapien prosthetic, stented (TAVR) valve present in the aortic position. Procedure Date: 08/14/2020. LEFT VENTRICLE PLAX 2D LVIDd:         5.20 cm   Diastology LVIDs:         3.70 cm   LV e' medial:   6.22 cm/s LV PW:         1.10 cm   LV E/e' medial: 14.6 LV IVS:        1.10 cm LVOT diam:     2.20 cm LV SV:         134 LV SV Index:   68 LVOT Area:     3.80 cm  LEFT ATRIUM         Index LA diam:    4.40 cm 2.24 cm/m  AORTIC VALVE AV Area (Vmax):    1.96 cm AV Area (Vmean):   1.98 cm AV Area (VTI):     1.85 cm AV Vmax:           298.00 cm/s AV Vmean:          205.500 cm/s AV VTI:            0.724 m AV Peak Grad:      35.5 mmHg AV Mean Grad:      19.5 mmHg LVOT Vmax:  154.00 cm/s LVOT Vmean:        107.000 cm/s LVOT VTI:          0.353 m LVOT/AV VTI ratio: 0.49  AORTA Ao Root diam: 2.60 cm MITRAL VALVE                TRICUSPID VALVE MV Area (PHT): 3.21 cm     TR Peak grad:   21.9 mmHg MV Decel Time: 236 msec     TR Vmax:        234.00 cm/s MV E velocity: 90.90 cm/s MV A velocity: 123.00 cm/s  SHUNTS MV E/A ratio:  0.74         Systemic VTI:  0.35 m                             Systemic Diam: 2.20 cm Shannon Schultz Electronically signed by Shannon Schultz Signature Date/Time: 02/10/2024/3:30:20 PM    Final    Lab Results  Component Value Date   WBC 5.2 09/24/2023   HGB 13.1 09/24/2023   HCT 41.2 09/24/2023   MCV 92.4 09/24/2023   PLT 107 (L) 09/24/2023    Lab Results  Component Value Date   NA 140 09/24/2023   CL 106 09/24/2023   K 3.9 09/24/2023   CO2 31 09/24/2023   BUN 19 09/24/2023   CREATININE 0.62 09/24/2023   GFRNONAA >60 09/24/2023   CALCIUM  9.1 09/24/2023   PHOS 2.8 03/23/2022    ALBUMIN  4.0 12/16/2023   GLUCOSE 113 (H) 09/24/2023    Lab Results  Component Value Date   ALT 11 12/16/2023   AST 25 12/16/2023   ALKPHOS 90 12/16/2023   BILITOT 1.0 12/16/2023    Lab Results  Component Value Date   INR 1.4 (H) 04/28/2023   INR 1.1 11/20/2022   INR 1.1 (H) 09/13/2021     Physical Exam: BP 118/72   Pulse 74   Ht 5' 3 (1.6 m)   Wt 212 lb (96.2 kg)   LMP  (LMP Unknown)   BMI 37.55 kg/m  Constitutional: Pleasant,well-developed, female in no acute distress. Neurological: Alert and oriented to person place and time.SABRA Psychiatric: Normal mood and affect. Behavior is normal.   ASSESSMENT: 78 y.o. female here for assessment of the following  1. Cirrhosis of liver without ascites, unspecified hepatic cirrhosis type (HCC)   2. Esophageal varices without bleeding, unspecified esophageal varices type (HCC)   3. History of gastric ulcer    Cirrhosis has generally been compensated over time.  She does have esophageal varices on cross-sectional imaging and on recent EGD.  I had discussed with her primary care starting Coreg , in light of her COPD wanted to make sure she was okay for this.  She is tolerating Coreg  currently, has had her BP meds altered and no hypotension, doing well.  We discussed the finding of varices, increased risk for bleeding over time with this especially in the setting of Eliquis .  She did not have any high risk varices on her exam.  We will continue Coreg  for now as long as she is tolerating this.  Hesitant to get more aggressive with dosing in light of her COPD and oxygen  use.  Will monitor for now.  Of note not checking INR routinely as we will be altered in the setting of Eliquis  use  Discussed cirrhosis, risks for decompensation and HCC over time.  She and her husband understand this.  Repeat ultrasound  in October for [facility] screening, due for labs in October as well.  Recommend she continue to avoid NSAIDs and can use low-dose Tylenol  for her  foot pain.  Ulcers had intervally healed but she does have some gastritis, suspect due to portal hypertension.  On Coreg  and will continue omeprazole  for now.  Follow-up in 6 months or sooner with issues   PLAN: - RUQ US  in October  - lab in October - CBC, LFTs, and AFP (INR not accurate due to Eliquis ) - will check iron studies to make sure okay (do not see this done previously during workup for chronic liver disease) - continue Coreg  - continue omeprazole  - follow up in 6 months or sooner with issues  Marcey Naval, MD [facility]

## 2024-03-03 ENCOUNTER — Telehealth: Payer: Self-pay

## 2024-03-03 NOTE — Progress Notes (Signed)
   03/03/2024  Patient ID: Shannon Schultz, female   DOB: 03/21/1946, 78 y.o.   MRN: [id number]  Received fax request from [employer] requesting refill approval for Ozempic .  Faxed in order for 4 month supply.  Jon VEAR Lindau, PharmD Clinical Pharmacist [phone]

## 2024-03-04 DIAGNOSIS — S92324D Nondisplaced fracture of second metatarsal bone, right foot, subsequent encounter for fracture with routine healing: Secondary | ICD-10-CM | POA: Diagnosis not present

## 2024-03-04 DIAGNOSIS — M79671 Pain in right foot: Secondary | ICD-10-CM | POA: Diagnosis not present

## 2024-03-05 ENCOUNTER — Other Ambulatory Visit: Payer: Self-pay | Admitting: Physician Assistant

## 2024-03-08 NOTE — Telephone Encounter (Signed)
 Pt of Dr. Francyne. Please advise on a refill of this RX.

## 2024-03-16 ENCOUNTER — Ambulatory Visit (HOSPITAL_BASED_OUTPATIENT_CLINIC_OR_DEPARTMENT_OTHER): Admitting: Pulmonary Disease

## 2024-03-16 ENCOUNTER — Encounter (HOSPITAL_BASED_OUTPATIENT_CLINIC_OR_DEPARTMENT_OTHER): Payer: Self-pay | Admitting: Pulmonary Disease

## 2024-03-16 ENCOUNTER — Other Ambulatory Visit: Payer: Self-pay | Admitting: Gastroenterology

## 2024-03-16 ENCOUNTER — Ambulatory Visit (HOSPITAL_COMMUNITY)

## 2024-03-16 VITALS — BP 142/81 | HR 90 | Ht 63.0 in | Wt 209.4 lb

## 2024-03-16 DIAGNOSIS — J9611 Chronic respiratory failure with hypoxia: Secondary | ICD-10-CM

## 2024-03-16 NOTE — Progress Notes (Signed)
 Subjective:   PATIENT ID: Shannon Schultz GENDER: female DOB: 23-Mar-1946, MRN: [id number]   HPI  Chief Complaint  Patient presents with   COPD    Pt states she is feeling good   Mrs. Robin Petrakis is a 78 year old female with allergic rhinitis moderate COPD, HTN, DM2, AS s/p TAVR 07/2020 who presents for follow-up.  Synopsis: Diagnosis of COPD well-controlled on bronchodilators. Exacerbations annually including hospitalization 02/2022. Stepped up to Trelegy in 2023.  09/02/2021 Since her last visit she has been compliant with her Anoro and Singulair  daily.  She was treated with azithromycin  for COPD flare/acute bronchitis in December 2022.  Her last COPD exacerbation for this was 2021. She currently reports mild cough with nasal congestion. Triggered [place] pollen [place] She is on zyrtec and Singulair . Otherwise she reports her symptoms are well-controlled. Denies wheezing. No limitations in activity. Compliant with oxygen  at night however would want to discontinue this if she no longer needs it.  09/30/21 Since our last visit, she was diagnosed with PE. On the day after her CT chest lung screen, she reports abrupt onset of left sided chest pain. No fevers or cough preceding this. Was seen by PCP and treated with antibiotics. She then developed hemoptysis. After seeing Pulmonary NP CTA was ordered and multiple PE diagnosed however study was suboptimal. Started on Eliquis . Since then she reports improved left chest pain, resolved hemoptysis and improved cough though does complain of upper airway congestion/cough.  01/08/22 She reports she has not been on Eliquis  for a month. She is scheduled for surgery with Dr. Sheree tomorrow for laser ablation. Denies shortness of breath, chest pain, cough, hemoptysis, shortness of breath or wheezing.  07/16/22 Since our last visit she has been treated for COPD exacerbation including hospitalization in September. Daytime oxygenation has improved.  Wearing nighttime. She is on Trelegy 100. Able to perform housework and able to shop at [employer]. Cough has resolved. Denies significant shortness of breath, cough or wheezing. Reflux has improved.  01/12/23 Since our last visit her last exacerbation was treated as an outpatient in March. Doing well on oxygen  at night. She is compliant with her Trelegy. Will have shortness of breath and wheezing with moderate exertion. Will use albuterol  as needed. Could use it more often prophylactically.  04/14/23 Since our last visit denies cough or wheezing. Shortness of breath with moderate exertion. Rarely uses albuterol . Compliant with Trelegy daily. Last exacerbation > 6 months. Not needing to use walker. Performs housework but no regular exercise.   03/16/24 Since our last visit she is overall doing well on Trelegy 100. Has not needed rescue at all. No exacerbations in the last 12 months. Limited mobility due to breaking right foot in the last 3 weeks. Using walker. She is compliant with oxygen .  Social History: 46 pack years. Quit in 2012. During 2018, she lost her sister to breast cancer in early 2018, was diagnosed with breast cancer s/p lumpectomy, had bladder cancer and underwent resection and intravesicular chemo, fell and broke her right shoulder, underwent ventral hernia repair with mesh insertion.   Patient Active Problem List   Diagnosis Date Noted   Mucosal abnormality of stomach 12/03/2023   Gastric ulcer without hemorrhage or perforation 07/20/2023   Positive fecal occult blood test 07/20/2023   Benign neoplasm of colon 07/20/2023   Type 2 diabetes mellitus with other specified complication (HCC) 11/22/2022   Hypertension associated with diabetes (HCC) 11/22/2022   Generalized  anxiety disorder 11/22/2022   Eczema 11/22/2022   Gastroesophageal reflux disease without esophagitis 11/22/2022   COPD with chronic bronchitis and emphysema (HCC) 05/13/2022   COPD exacerbation (HCC) 03/24/2022    Acute on chronic respiratory failure with hypoxia (HCC) 03/23/2022   Nonalcoholic steatohepatitis (NASH) 03/23/2022   History of colonic polyps 03/23/2022   Diverticulosis of colon 03/23/2022   History of pulmonary embolus (PE) 03/19/2022   Personal history of breast cancer 11/15/2021   Pulmonary embolism (HCC) 09/14/2021   Hemoptysis 09/13/2021   Pleural effusion 09/13/2021   Diastolic dysfunction 09/13/2021   URI (upper respiratory infection) 09/13/2021   Nocturnal hypoxemia 11/12/2020   S/P TAVR (transcatheter aortic valve replacement) 08/14/2020   Severe aortic stenosis    Contrast media allergy 06/26/2020   AAA (abdominal aortic aneurysm) without rupture 06/26/2020   Allergic rhinitis 04/27/2020   Upper airway cough syndrome 12/20/2019   Hip fracture (HCC) 08/25/2019   Venous insufficiency 09/10/2018   Sinusitis 06/25/2018   Ophthalmic migraine 03/16/2018   COPD, group B, by GOLD 2017 classification (HCC) 09/30/2017   Chronic hypoxemic respiratory failure (HCC) 09/29/2017   Ventral hernia without obstruction or gangrene 05/15/2017   Hepatic cirrhosis (HCC) 03/11/2017   Urothelial cancer (HCC) 03/10/2017   Atherosclerosis of coronary artery 09/14/2016   Aortic atherosclerosis (HCC) 09/14/2016   Class 3 severe obesity due to excess calories with serious comorbidity and body mass index (BMI) of 40.0 to 44.9 in adult 11/10/2015   Thrombocytopenia (HCC) 08/17/2015   Elevated troponin 08/17/2015   Hyperlipidemia associated with type 2 diabetes mellitus (HCC) 08/17/2015   COPD with acute exacerbation (HCC) 08/16/2015   Severe obesity (BMI >= 40) (HCC) 10/06/2014   Family history BRCA2 gene positive    HSV (herpes simplex virus) infection 06/07/2012   CAP (community acquired pneumonia) 06/06/2011   Pure hypercholesterolemia 11/13/2010   Essential hypertension, benign 11/13/2010    Outpatient Medications Prior to Visit  Medication Sig Dispense Refill   ACCU-CHEK AVIVA PLUS  test strip TEST BLOOD SUGAR EVERY DAY 100 strip 3   acetaminophen  (TYLENOL ) 500 MG tablet Take 500-1,000 mg by mouth every 6 (six) hours as needed for moderate pain (pain score 4-6) or headache.     albuterol  (PROVENTIL ) (2.5 MG/3ML) 0.083% nebulizer solution Take 3 mLs (2.5 mg total) by nebulization every 6 (six) hours as needed for wheezing or shortness of breath. 75 mL 12   albuterol  (VENTOLIN  HFA) 108 (90 Base) MCG/ACT inhaler Inhale 2 puffs into the lungs every 6 (six) hours as needed for wheezing or shortness of breath. 3 each 3   Alcohol  Swabs  (B-D SINGLE USE SWABS  REGULAR) PADS Use twice a day when checking blood sugars 100 each 1   amoxicillin  (AMOXIL ) 500 MG capsule TAKE 4 CAPSULES BY MOUTH 1 HOUR BEFORE DENTAL PROCEDURE 4 capsule 5   apixaban  (ELIQUIS ) 2.5 MG TABS tablet TAKE 1 TABLET(2.5 MG) BY MOUTH TWICE DAILY 60 tablet 0   carvedilol  (COREG ) 6.25 MG tablet Take 1 tablet (6.25 mg total) by mouth 2 (two) times daily with a meal. 180 tablet 0   cetirizine (ZYRTEC) 10 MG tablet Take 10 mg by mouth daily.     clobetasol cream (TEMOVATE) 0.05 % Apply 1 Application topically 2 (two) times daily as needed (eczema).     escitalopram  (LEXAPRO ) 10 MG tablet TAKE 1 TABLET EVERY DAY 90 tablet 0   guaiFENesin  (MUCINEX ) 600 MG 12 hr tablet Take 600 mg by mouth at bedtime.     hydrochlorothiazide  (HYDRODIURIL ) 12.5  MG tablet Take 1 tablet (12.5 mg total) by mouth daily. 90 tablet 3   hydrocortisone cream 1 % Apply 1 Application topically 2 (two) times daily as needed for itching.     montelukast  (SINGULAIR ) 10 MG tablet TAKE 1 TABLET AT BEDTIME 90 tablet 3   Multiple Vitamins-Minerals (PRESERVISION AREDS 2 PO) Take 1 each by mouth 2 (two) times daily.     omeprazole  (PRILOSEC) 40 MG capsule TAKE 1 CAPSULE TWICE DAILY 180 capsule 3   Semaglutide , 1 MG/DOSE, (OZEMPIC , 1 MG/DOSE,) 4 MG/3ML SOPN Inject 1 mg into the skin every Friday.     simvastatin  (ZOCOR ) 20 MG tablet TAKE 1 TABLET AT BEDTIME 90  tablet 1   TRELEGY ELLIPTA  100-62.5-25 MCG/ACT AEPB INHALE 1 PUFF INTO THE LUNGS DAILY. 180 each 3   triamcinolone  cream (KENALOG ) 0.1 % Apply 1 Application topically 2 (two) times daily as needed (eczema/dry skin).     TRUEplus Lancets 30G MISC TEST BLOOD SUGAR EVERY DAY 100 each 3   valACYclovir  (VALTREX ) 500 MG tablet TAKE 1 TABLET EVERY DAY 90 tablet 2   No facility-administered medications prior to visit.    Review of Systems  Constitutional:  Negative for chills, diaphoresis, fever, malaise/fatigue and weight loss.  HENT:  Negative for congestion.   Respiratory:  Negative for cough, hemoptysis, sputum production, shortness of breath and wheezing.   Cardiovascular:  Negative for chest pain, palpitations and leg swelling.    Objective:   Vitals:   03/16/24 1106  BP: (!) 142/81  Pulse: 90  SpO2: 95%  Weight: 209 lb 6.4 oz (95 kg)  Height: 5' 3 (1.6 m)   SpO2: 95 %   Body mass index is 37.09 kg/m.  Physical Exam: General: Well-appearing, no acute distress HENT: [place], AT Eyes: EOMI, no scleral icterus Respiratory: Clear to auscultation bilaterally.  No crackles, wheezing or rales Cardiovascular: RRR, -M/R/G, no JVD Extremities:-Edema,-tenderness Neuro: AAO x4, CNII-XII grossly intact Psych: Normal mood, normal affect  Data Reviewed:  Imaging CT Chest lung screen 09/10/16 -moderate emphysema CT Chest lung screen 09/16/17-moderate emphysema, scattered subcentimeter pulmonary nodules.  Fatty liver with findings of cirrhosis. CT Chest lung screen 11/29/18- interval development of RUL nodule measuring 6.1 mm CT Chest 03/03/19 - slightly decreased RUL nodule measuring 5.3 mm Screening CT chest 03/26/20 - centrilobular emphysema. Scattered tiny pulmonary nodules, stable CTA 07/13/20 Stable pulmonary nodules VQ scan 04/02/2021-no pulmonary embolism CTA 09/13/2021-suboptimal contrast however radiology read concerning for filling defects in bilateral pulmonary arteries including  segmental and right middle and lower lobe, lingula and left lower lobe.  Left lower lobe consolidation concerning for pulmonary infarct/pneumonia.  Background emphysema CTA PE 10/14/21 - Resolved segmental emboli. Resolved LLL consolidation with residual scarring. Moderate centrilobular emphysema. Cirrhosis. Splenomegaly 03/23/22 - No PE. Emphysema CT Chest lung screen 09/10/22 - New right lung apex nodule measuring 7.3 mm, multiple pulmonary nodules CT Chest lung screen 12/12/22 - Moderate centrilobular emphysema. New RUL nodule 7.6 mm. Prior 7.4 right lung apex nodule decreased to 5.8 mm CT Chest Lung Screen 03/16/23 - Resolved RUL nodule. Largest 5 mm. No nodules. Emphysema  PFTs 08/28/11 FVC 2.71 [92%], FEV1 1.33 (62%), F/F 49, TLC 114%, DLCO 46% Moderate obstructive defect with moderate reduction in diffusion capacity. No bronchodilator response   11/08/15 FVC 2.05 [7%), FEV1 1.23 (55%), F/F 60 Moderate obstructive defect   09/24/16 FVC 2. (82%), FEV1 1.43 (62%), F/F 58 , TLC 99%, DLCO 54% Moderate obstruction with moderate diffusion defect. No bronchodilator response.  Labs A1AT 08/08/16- 162, PIMM  Sleep study Home sleep study 05/03/19 - AHI 8.8 Nadir SpO2 76% Overnight oximetry 09/24/21 - SpO2 <88% for 44 minutes and 32 seconds. Nadir SpO2 80%. Recommend to continue wearing 2L O2.   Assessment & Plan:  78 year old female former smoker with COPD with emphysema, chronic hypoxemic respiratory, unprovoked PE, OSA not on CPAP, AS s/p TAVR 2022 who presents for follow-up. Well controlled on Trelegy. No exacerbations >12 months. Discussed clinical course and management of COPD including bronchodilator regimen, preventive care including vaccinations and action plan for exacerbation.  Moderate COPD GOLD B - well controlled --CONTINUE Trelegy 100 ONE puff ONCE a day.  --CONTINUE Albuterol  AS NEEDED for shortness of breath or wheezing  Nocturnal hypoxemic respiratory failure secondary to mild  OSA - increased O2 requirement from 2>3L Discussed CPAP vs oral appliance vs weight loss. Prefers to continue oxygen . No symptoms of fatigue, shortness of breath, headaches. Overall asymptomatic. --CONTINUE 3L pulsed oxygen  via nasal cannula with activity --CONTINUE 3L continuous oxygen  nightly  --SpO2 >88% --ORDER overnight oximetry on room air  Right upper lobe lung nodule - resolved Waxing and waning pulmonary nodules --CT Lung screen as scheduled  Acute pulmonary embolism, unprovoked --Hematology recommends lifelong anticoagulation on maintenance dosing --Eliquis  2.5 mg BID  Allergic Rhinitis --CONTINUE Zyrtec --CONTINUE Singulair  --STOP Flonase  due to nose bleeds. Call office for atrovent  nasal spray if congestion recurs   Health maintenance  Immunization History  Administered Date(s) Administered    sv, Bivalent, Protein Subunit Rsvpref,pf (Abrysvo) 05/20/2022   Fluad Quad(high Dose 65+) 04/07/2019, 04/26/2020, 05/08/2021, 03/25/2022   Fluad Trivalent(High Dose 65+) 04/14/2023   Hepb-cpg 11/02/2023, 12/04/2023   INFLUENZA, HIGH DOSE SEASONAL PF 04/07/2013, 05/15/2014, 05/30/2015, 03/25/2016, 03/05/2017, 04/01/2018   Influenza Split 05/28/2011   Influenza, Seasonal, Injecte, Preservative Fre 06/07/2012   PFIZER Comirnaty(Gray Top)Covid-19 Tri-Sucrose Vaccine 10/25/2020   PFIZER(Purple Top)SARS-COV-2 Vaccination 08/20/2019, 09/13/2019, 04/14/2020   PNEUMOCOCCAL CONJUGATE-20 06/03/2023   Pfizer Covid-19 Vaccine Bivalent Booster 68yrs & up 05/08/2021   Pneumococcal Conjugate-13 07/06/2014   Pneumococcal Polysaccharide-23 12/15/2004, 05/28/2011, 05/27/2021   Td 08/27/2023   Tdap 02/23/2008, 06/15/2018   Zoster Recombinant(Shingrix) 12/02/2017, 02/06/2018   Zoster, Live 05/17/2010   Annual CT Lung -Enrolled  Orders Placed This Encounter  Procedures   Pulse oximetry, overnight    On room air    Standing Status:   Future    Expiration Date:   03/16/2025   No orders of  the defined types were placed in this encounter.  Return in about 6 months (around 09/13/2024).  I have spent a total time of 30-minutes on the day of the appointment including chart review, data review, collecting history, coordinating care and discussing medical diagnosis and plan with the patient/family. Past medical history, allergies, medications were reviewed. Pertinent imaging, labs and tests included in this note have been reviewed and interpreted independently by me.  Ordell Prichett Slater Staff, MD [facility] 03/16/2024 1:08 PM

## 2024-03-16 NOTE — Patient Instructions (Addendum)
 Moderate COPD GOLD B - well controlled --CONTINUE Trelegy 100 ONE puff ONCE a day.   Nocturnal hypoxemic respiratory failure secondary to mild OSA  --CONTINUE 3L pulsed oxygen  via nasal cannula with activity --CONTINUE 3L continuous oxygen  nightly  --SpO2 >88% --ORDER overnight oximetry on room air

## 2024-03-18 ENCOUNTER — Ambulatory Visit (HOSPITAL_BASED_OUTPATIENT_CLINIC_OR_DEPARTMENT_OTHER)
Admission: RE | Admit: 2024-03-18 | Discharge: 2024-03-18 | Disposition: A | Discharge status: Home / Self Care | Source: Ambulatory Visit | Attending: Vascular Surgery | Admitting: Vascular Surgery

## 2024-03-18 ENCOUNTER — Other Ambulatory Visit (HOSPITAL_COMMUNITY): Payer: Self-pay | Admitting: Physician Assistant

## 2024-03-18 ENCOUNTER — Encounter (HOSPITAL_COMMUNITY): Payer: Self-pay

## 2024-03-18 ENCOUNTER — Ambulatory Visit (HOSPITAL_COMMUNITY)
Admission: RE | Admit: 2024-03-18 | Discharge: 2024-03-18 | Disposition: A | Discharge status: Home / Self Care | Source: Ambulatory Visit | Attending: Acute Care | Admitting: Acute Care

## 2024-03-18 DIAGNOSIS — M898X5 Other specified disorders of bone, thigh: Secondary | ICD-10-CM | POA: Diagnosis not present

## 2024-03-18 DIAGNOSIS — R600 Localized edema: Secondary | ICD-10-CM | POA: Diagnosis not present

## 2024-03-18 DIAGNOSIS — Z87891 Personal history of nicotine dependence: Secondary | ICD-10-CM | POA: Diagnosis not present

## 2024-03-18 DIAGNOSIS — M25561 Pain in right knee: Secondary | ICD-10-CM | POA: Diagnosis not present

## 2024-03-18 DIAGNOSIS — Z122 Encounter for screening for malignant neoplasm of respiratory organs: Secondary | ICD-10-CM | POA: Insufficient documentation

## 2024-03-23 ENCOUNTER — Ambulatory Visit: Admitting: Hematology and Oncology

## 2024-03-23 ENCOUNTER — Other Ambulatory Visit

## 2024-03-25 ENCOUNTER — Other Ambulatory Visit (HOSPITAL_BASED_OUTPATIENT_CLINIC_OR_DEPARTMENT_OTHER): Payer: Self-pay | Admitting: Pulmonary Disease

## 2024-03-25 DIAGNOSIS — M25561 Pain in right knee: Secondary | ICD-10-CM | POA: Diagnosis not present

## 2024-03-25 DIAGNOSIS — M79671 Pain in right foot: Secondary | ICD-10-CM | POA: Diagnosis not present

## 2024-03-28 ENCOUNTER — Telehealth (HOSPITAL_BASED_OUTPATIENT_CLINIC_OR_DEPARTMENT_OTHER): Payer: Self-pay

## 2024-03-28 NOTE — Telephone Encounter (Signed)
 Spoke with patient and advised that her lung screening scan results have not been finalized yet and we will contact her as soon as we receive results. Pt verbalized understanding.

## 2024-03-28 NOTE — Telephone Encounter (Signed)
 Copied from CRM #[id number]. Topic: Clinical - Lab/Test Results >> Mar 28, 2024  8:48 AM Isabell A wrote: Reason for CRM: Patient would like to discuss her lung cancer screening results.   Callback number: [phone] or [phone]

## 2024-03-29 ENCOUNTER — Ambulatory Visit (HOSPITAL_COMMUNITY)
Admission: RE | Admit: 2024-03-29 | Discharge: 2024-03-29 | Disposition: A | Discharge status: Home / Self Care | Source: Ambulatory Visit | Attending: Sports Medicine | Admitting: Sports Medicine

## 2024-03-29 ENCOUNTER — Other Ambulatory Visit (HOSPITAL_COMMUNITY): Payer: Self-pay | Admitting: Sports Medicine

## 2024-03-29 ENCOUNTER — Telehealth: Payer: Self-pay | Admitting: *Deleted

## 2024-03-29 ENCOUNTER — Encounter (HOSPITAL_COMMUNITY): Payer: Self-pay

## 2024-03-29 DIAGNOSIS — S83281A Other tear of lateral meniscus, current injury, right knee, initial encounter: Secondary | ICD-10-CM | POA: Diagnosis not present

## 2024-03-29 DIAGNOSIS — M25561 Pain in right knee: Secondary | ICD-10-CM | POA: Insufficient documentation

## 2024-03-29 DIAGNOSIS — M7121 Synovial cyst of popliteal space [Baker], right knee: Secondary | ICD-10-CM | POA: Diagnosis not present

## 2024-03-29 DIAGNOSIS — S83231A Complex tear of medial meniscus, current injury, right knee, initial encounter: Secondary | ICD-10-CM | POA: Diagnosis not present

## 2024-03-29 DIAGNOSIS — M1711 Unilateral primary osteoarthritis, right knee: Secondary | ICD-10-CM | POA: Diagnosis not present

## 2024-03-29 DIAGNOSIS — R911 Solitary pulmonary nodule: Secondary | ICD-10-CM

## 2024-03-29 DIAGNOSIS — Z87891 Personal history of nicotine dependence: Secondary | ICD-10-CM

## 2024-03-29 NOTE — Telephone Encounter (Signed)
 Spoke with pt and reviewed CT results. New smal lung nodule noted that we will look at again in 6 months with a repeat CT. Pt verbalized understanding. Results/ plans faxed to PCP. Order placed for 6 month nodule follow up CT.

## 2024-03-29 NOTE — Telephone Encounter (Signed)
 Call report from [facility]:  IMPRESSION: 1. Lung-RADS 3, probably benign findings. Short-term follow-up in 6 months is recommended with repeat low-dose chest CT without contrast (please use the following order, CT CHEST LCS NODULE FOLLOW-UP W/O CM). New 5.4 mm right upper lobe pulmonary nodule. 2. Cirrhosis. 3. Aortic Atherosclerosis (ICD10-I70.0) and Emphysema (ICD10-J43.9).

## 2024-03-31 ENCOUNTER — Other Ambulatory Visit: Payer: Self-pay | Admitting: Hematology and Oncology

## 2024-03-31 DIAGNOSIS — I2699 Other pulmonary embolism without acute cor pulmonale: Secondary | ICD-10-CM

## 2024-03-31 NOTE — Progress Notes (Unsigned)
 [facility] [phone]   [phone]  PROGRESS NOTE  Patient Care Team: Randol Dawes, MD as PCP - General (Family Medicine) Croitoru, Jerel, MD as PCP - Cardiology (Cardiology) Randol Dawes, MD as Referring Physician (Family Medicine) Liane Sharyne MATSU, [facility] (Inactive) as Pharmacist (Pharmacist) Neomi Johnston ONEIDA DEVONNA as Physician Assistant (Hematology and Oncology) Kristie Lamprey, MD as Consulting Physician (Gastroenterology) Curvin Deward MOULD, MD as Consulting Physician (General Surgery) Renda Glance, MD as Consulting Physician (Urology) Sheril Coy, MD as Consulting Physician (Orthopedic Surgery) Kassie Acquanetta Bradley, MD as Consulting Physician (Pulmonary Disease) Fidel Rogue, MD as Consulting Physician (Orthopedic Surgery) Sharl Selinda Dover, MD as Consulting Physician (Orthopedic Surgery)  Hematological/Oncological History # Bilateral Pulmonary Emboli  1) 09/13/2021: Presented to pulmonology team due to cough with hemoptysis.  CTA chest revealed filling defects within bilateral pulmonary arteries that concerning for pulmonary emboli.  This includes a segmental right lower lobe, middle lobe and lingular pulmonary arteries as well as possibly within the left lower lobe and bilateral upper lobe segmental pulmonary arteries.  Patient was advised to go to the emergency room and she was started on heparin  infusion.  Bilateral Doppler ultrasound of the lower extremities was negative for DVT.  She was discharged on Eliquis  starter pack.  2)  10/14/2021: Repeat CTA chest: Resolved emboli in the segmental arteries.  No evidence of acute pulmonary embolism  3) 10/16/2021: Establish care with [facility]ology  4) 03/19/2022: Recommend to transition to maintenance dose of Eliquis  2.5 mg twice daily  HISTORY OF PRESENTING ILLNESS:  Shannon Schultz 78 y.o. female returns for a follow-up for history of bilateral pulmonary emboli.  She was last seen on 09/24/2023.  In  the interim, she denies any changes to her health.  On exam today, Shannon Schultz reports she has a meniscal tear in her right knee and is currently working with the orthopedic doctor for evaluation for surgery.  She reports that she has had no further falls in the interim since our last visit.  She reports that she is not having any trouble with the Eliquis  other than a little bit of bleeding when she uses Q-tips in her ears but no heavy nosebleeds, gum bleeding, or blood in the urine/stool.  She does have some bruising.  She is not have any signs or symptoms concerning for recurrent clot such as leg pain, leg swelling, chest pain, or shortness of breath.  She reports that she is able to afford the medication well and it cost her $240 for the first 87-month supply and $0 a month thereafter.  She reports overall she feels well and has no additional questions concerns or complaints today.  A full 10 point ROS is otherwise negative.  MEDICAL HISTORY:  Past Medical History:  Diagnosis Date   Acute medial meniscus tear    Anxiety    Arthritis    hands (05/15/2017)   Baker's cyst    Bladder cancer (HCC) 2018   Breast cancer, right (HCC) 1992   DCIS,bladder ca (just dx)   Cirrhosis (HCC)    Colon polyp    Complication of anesthesia 1992   local anesthesia used was hard to awaken from-no problems since (05/15/2017)   COPD (chronic obstructive pulmonary disease) (HCC)    Coronary artery disease    COVID-19 virus infection 05/05/2019   Diverticulosis    Elevated liver enzymes    fatty liver per ultrasound per pt   FHx: BRCA2 gene positive    sister with  BRCA2 mutation (pt tested NEGATIVE)   Fibromyalgia    Gastric ulcer    Heart murmur    faintly heard since TAVR 08-14-2020   HLD (hyperlipidemia)    HSV (herpes simplex virus) infection    on hip--on daily suppression   Hypertension    Hypothyroidism    took med 7 yrs after birth of 1st child   Impaired glucose tolerance    Migraine     Nonalcoholic steatohepatitis (NASH) 03/23/2022   On home oxygen  therapy    have it available but I'm not using it (05/15/2017)   Osteopenia    Personal history of breast cancer 11/15/2021   S/P TAVR (transcatheter aortic valve replacement) 08/14/2020   s/p TAVR with a 26 mm Edwards S3U via the left subclavian approach by Dr. Dusty and Dr. Verlin   Severe aortic stenosis    Severe obesity (BMI >= 40) (HCC) 10/06/2014   Sleep apnea    Sleeps with 2 L O2 @ night   Type 2 diabetes mellitus (HCC)    Urothelial cancer (HCC) 03/10/2017   Incidental bladder mass noted on CT, papillary urothelial CA on pathology from excision 12/2016; post-op instillation of chemo. Due for f/u cystoscopy 03/2017   Venous insufficiency 09/10/2018   Ventral hernia    Ventral hernia without obstruction or gangrene 05/15/2017   Vitamin D  deficiency disease     SURGICAL HISTORY: Past Surgical History:  Procedure Laterality Date   ABDOMINAL HERNIA REPAIR  05/15/2017   BIOPSY  07/20/2023   Procedure: BIOPSY;  Surgeon: Leigh Elspeth SQUIBB, MD;  Location: [facility];  Service: Gastroenterology;;   BIOPSY OF SKIN SUBCUTANEOUS TISSUE AND/OR MUCOUS MEMBRANE  12/03/2023   Procedure: BIOPSY, SKIN, SUBCUTANEOUS TISSUE, OR MUCOUS MEMBRANE;  Surgeon: Leigh Elspeth SQUIBB, MD;  Location: [facility];  Service: Gastroenterology;;   BREAST BIOPSY Right 1992   BREAST BIOPSY Left 2018   BREAST EXCISIONAL BIOPSY Left 2018   ATYPICAL DUCTAL HYPERPLASIA INVOLVING A COMPLEX   BREAST LUMPECTOMY WITH RADIOACTIVE SEED LOCALIZATION Left 12/22/2016   Procedure: LEFT BREAST LUMPECTOMY WITH RADIOACTIVE SEED LOCALIZATION;  Surgeon: Curvin Deward MOULD, MD;  Location: [facility] OR;  Service: General;  Laterality: Left;   CARDIAC CATHETERIZATION     CATARACT EXTRACTION, BILATERAL Bilateral R 03/16/2019 L 03/30/2019   Dr. Lelon   COLONOSCOPY  2009, 08/2010, 12/2015   Dr. Kristie; only 1 had any polyps (05/15/2017)   COLONOSCOPY WITH PROPOFOL  N/A  07/20/2023   Procedure: COLONOSCOPY WITH PROPOFOL ;  Surgeon: Leigh Elspeth SQUIBB, MD;  Location: [facility];  Service: Gastroenterology;  Laterality: N/A;   CYSTOSCOPY  04/23/2017   CYSTOSCOPY W/ RETROGRADES Bilateral 01/12/2017   Procedure: CYSTOSCOPY WITH RETROGRADE PYELOGRAM/ EXAM UNDER ANESTHESIA;  Surgeon: Renda Glance, MD;  Location: [facility];  Service: Urology;  Laterality: Bilateral;   ENDOVENOUS ABLATION SAPHENOUS VEIN W/ LASER Right 01/09/2022   endovenous laser ablation right greater saphenous vein by Penne Colorado MD   ESOPHAGOGASTRODUODENOSCOPY N/A 12/03/2023   Procedure: EGD (ESOPHAGOGASTRODUODENOSCOPY);  Surgeon: Leigh Elspeth SQUIBB, MD;  Location: [facility];  Service: Gastroenterology;  Laterality: N/A;   ESOPHAGOGASTRODUODENOSCOPY (EGD) WITH PROPOFOL  N/A 07/20/2023   Procedure: ESOPHAGOGASTRODUODENOSCOPY (EGD) WITH PROPOFOL ;  Surgeon: Leigh Elspeth SQUIBB, MD;  Location: [facility];  Service: Gastroenterology;  Laterality: N/A;   EYE SURGERY Bilateral    Cataracts removed   FEMUR IM NAIL Right 08/24/2019   Procedure: INTRAMEDULLARY (IM) NAIL FEMORAL;  Surgeon: Fidel Rogue, MD;  Location: [facility];  Service: Orthopedics;  Laterality: Right;   HERNIA  REPAIR     INSERTION OF MESH N/A 05/15/2017   Procedure: INSERTION OF MESH;  Surgeon: Curvin Deward MOULD, MD;  Location[facility] OR;  Service: General;  Laterality: N/A;   LAPAROSCOPIC CHOLECYSTECTOMY  2003   MASTECTOMY Right 1992   POLYPECTOMY  07/20/2023   Procedure: POLYPECTOMY;  Surgeon: Leigh Elspeth SQUIBB, MD;  Location: [facility];  Service: Gastroenterology;;   RIGHT/LEFT HEART CATH AND CORONARY ANGIOGRAPHY N/A 07/05/2020   Procedure: RIGHT/LEFT HEART CATH AND CORONARY ANGIOGRAPHY;  Surgeon: Verlin Lonni BIRCH, MD;  Location: [facility];  Service: Cardiovascular;  Laterality: N/A;   TEE WITHOUT CARDIOVERSION N/A 08/14/2020   Procedure: TRANSESOPHAGEAL ECHOCARDIOGRAM (TEE);  Surgeon: Verlin Lonni BIRCH, MD;  Location: [facility] OR;  Service: Open Heart Surgery;  Laterality: N/A;   TRANSURETHRAL RESECTION OF BLADDER TUMOR WITH MITOMYCIN -C N/A 01/12/2017   Procedure: TRANSURETHRAL RESECTION OF BLADDER TUMOR WITH POSSIBLE POST OPERATIVE INSTILLATION OF MITOMYCIN -C;  Surgeon: Renda Glance, MD;  Location: [facility];  Service: Urology;  Laterality: N/A;   TYMPANOSTOMY TUBE PLACEMENT Bilateral 1980s   UMBILICAL HERNIA REPAIR  2003   VENTRAL HERNIA REPAIR N/A 05/15/2017   Procedure: VENTRAL HERNIA REPAIR WITH MESH;  Surgeon: Curvin Deward MOULD, MD;  Location: [facility];  Service: General;  Laterality: N/A;    SOCIAL HISTORY: Social History   Socioeconomic History   Marital status: Married    Spouse name: Elsie   Number of children: 3   Years of education: Not on file   Highest education level: Not on file  Occupational History   Occupation: Retired  Tobacco Use   Smoking status: Former    Current packs/day: 0.00    Average packs/day: 1 pack/day for 46.0 years (46.0 ttl pk-yrs)    Types: Cigarettes    Start date: 05/30/1965    Quit date: 05/31/2011    Years since quitting: 12.8   Smokeless tobacco: Never  Vaping Use   Vaping status: Never Used  Substance and Sexual Activity   Alcohol  use: No   Drug use: No   Sexual activity: Yes    Partners: Male    Birth control/protection: Post-menopausal  Other Topics Concern   Not on file  Social History Narrative   Lives with her husband.  2 sons live in [place] nearby, 1 son moved to [place], [place] in 05/2023.   No pets. 6 grandchildren.         Updated 05/2023   Social Drivers of Health   Financial Resource Strain: Low Risk  (11/20/2022)   Overall Financial Resource Strain (CARDIA)    Difficulty of Paying Living Expenses: Not very hard  Food Insecurity: No Food Insecurity (11/20/2022)   Hunger Vital Sign    Worried About Running Out of Food in the Last Year: Never true    Ran Out of Food in the Last Year: Never true  Transportation Needs: No  Transportation Needs (11/20/2022)   PRAPARE - Administrator, Civil Service (Medical): No    Lack of Transportation (Non-Medical): No  Physical Activity: Not on file  Stress: No Stress Concern Present (11/20/2022)   [employer] of Occupational Health - Occupational Stress Questionnaire    Feeling of Stress : Only a little  Social Connections: Socially Integrated (11/20/2022)   Social Connection and Isolation Panel    Frequency of Communication with Friends and Family: Three times a week    Frequency of Social Gatherings with Friends and Family: Once a week    Attends Religious Services:  More than 4 times per year    Active Member of Clubs or Organizations: No    Attends Banker Meetings: More than 4 times per year    Marital Status: Married  Catering manager Violence: Unknown (11/20/2022)   Humiliation, Afraid, Rape, and Kick questionnaire    Fear of Current or Ex-Partner: No    Emotionally Abused: No    Physically Abused: Not on file    Sexually Abused: Not on file    FAMILY HISTORY: Family History  Problem Relation Age of Onset   Heart disease Mother        tachycardia   Heart disease Father    Diabetes Father    Hypertension Father    Asthma Sister    Allergies Sister    Hyperlipidemia Sister    Hashimoto's thyroiditis Sister    Fibromyalgia Sister    Skin cancer Sister        leg; dx after 54; surgery only   Arthritis Sister    Hypertension Sister    Kidney disease Sister    Varicose Veins Sister    Asthma Sister    Allergies Sister    Hyperlipidemia Sister    Hashimoto's thyroiditis Sister    Fibromyalgia Sister    Arthritis Sister    Breast cancer Sister 32       BRCA2 positive; metastatic in late 74s   Arthritis Brother    Stomach cancer Maternal Grandmother    Cancer Maternal Grandmother        deceased 58; unk. primary; possibly stomach   Tuberculosis Maternal Grandfather    Stroke Paternal Grandmother 81       died of  cerebral hemorrhage   ADD / ADHD Son    Depression Son    Other Niece        BRCA2 positive; prophylatic mastectomy   Cancer Other        unknown GYN cancers; MGM's sisters   Colon cancer Neg Hx    Esophageal cancer Neg Hx    Pancreatic cancer Neg Hx     ALLERGIES:  is allergic to contrast media [iodinated contrast media], iohexol , lisinopril, sulfa antibiotics, latex, codeine, levofloxacin, lipitor [atorvastatin calcium ], meloxicam, nickel, and vicodin [hydrocodone -acetaminophen ].  MEDICATIONS:  Current Outpatient Medications  Medication Sig Dispense Refill   ACCU-CHEK AVIVA PLUS test strip TEST BLOOD SUGAR EVERY DAY 100 strip 3   acetaminophen  (TYLENOL ) 500 MG tablet Take 500-1,000 mg by mouth every 6 (six) hours as needed for moderate pain (pain score 4-6) or headache.     albuterol  (PROVENTIL ) (2.5 MG/3ML) 0.083% nebulizer solution Take 3 mLs (2.5 mg total) by nebulization every 6 (six) hours as needed for wheezing or shortness of breath. 75 mL 12   albuterol  (VENTOLIN  HFA) 108 (90 Base) MCG/ACT inhaler Inhale 2 puffs into the lungs every 6 (six) hours as needed for wheezing or shortness of breath. 3 each 3   Alcohol  Swabs  (B-D SINGLE USE SWABS  REGULAR) PADS Use twice a day when checking blood sugars 100 each 1   amoxicillin  (AMOXIL ) 500 MG capsule TAKE 4 CAPSULES BY MOUTH 1 HOUR BEFORE DENTAL PROCEDURE 4 capsule 5   apixaban  (ELIQUIS ) 2.5 MG TABS tablet Take 1 tablet (2.5 mg total) by mouth 2 (two) times daily. 180 tablet 3   carvedilol  (COREG ) 6.25 MG tablet TAKE 1 TABLET TWICE DAILY WITH MEALS 180 tablet 1   cetirizine (ZYRTEC) 10 MG tablet Take 10 mg by mouth daily.     clobetasol cream (  TEMOVATE) 0.05 % Apply 1 Application topically 2 (two) times daily as needed (eczema).     escitalopram  (LEXAPRO ) 10 MG tablet TAKE 1 TABLET EVERY DAY 90 tablet 0   guaiFENesin  (MUCINEX ) 600 MG 12 hr tablet Take 600 mg by mouth at bedtime.     hydrochlorothiazide  (HYDRODIURIL ) 12.5 MG tablet Take  1 tablet (12.5 mg total) by mouth daily. 90 tablet 3   hydrocortisone cream 1 % Apply 1 Application topically 2 (two) times daily as needed for itching.     Lancets Ultra Thin 30G MISC TEST BLOOD SUGAR EVERY DAY 100 each 3   montelukast  (SINGULAIR ) 10 MG tablet TAKE 1 TABLET AT BEDTIME 90 tablet 3   Multiple Vitamins-Minerals (PRESERVISION AREDS 2 PO) Take 1 each by mouth 2 (two) times daily.     neomycin -polymyxin-hydrocortisone (CORTISPORIN) 3.5-10000-1 OTIC suspension Place 4 drops into the left ear 3 (three) times daily. 10 mL 0   omeprazole  (PRILOSEC) 40 MG capsule TAKE 1 CAPSULE TWICE DAILY 180 capsule 3   Semaglutide , 1 MG/DOSE, (OZEMPIC , 1 MG/DOSE,) 4 MG/3ML SOPN Inject 1 mg into the skin every Friday.     simvastatin  (ZOCOR ) 20 MG tablet TAKE 1 TABLET AT BEDTIME 90 tablet 1   TRELEGY ELLIPTA  100-62.5-25 MCG/ACT AEPB INHALE 1 PUFF INTO THE LUNGS DAILY. 180 each 3   triamcinolone  cream (KENALOG ) 0.1 % Apply 1 Application topically 2 (two) times daily as needed (eczema/dry skin).     valACYclovir  (VALTREX ) 500 MG tablet TAKE 1 TABLET EVERY DAY 90 tablet 2   No current facility-administered medications for this visit.    REVIEW OF SYSTEMS:   Constitutional: ( - ) fevers, ( - )  chills , ( - ) night sweats Eyes: ( - ) blurriness of vision, ( - ) double vision, ( - ) watery eyes Ears, nose, mouth, throat, and face: ( - ) mucositis, ( - ) sore throat Respiratory: ( - ) cough, ( - ) dyspnea, ( - ) wheezes Cardiovascular: ( - ) palpitation, ( - ) chest discomfort, (+ ) lower extremity swelling Gastrointestinal:  ( - ) nausea, ( - ) heartburn, ( - ) change in bowel habits Skin: ( - ) abnormal skin rashes Lymphatics: ( - ) new lymphadenopathy, ( - ) easy bruising Neurological: ( - ) numbness, ( - ) tingling, ( - ) new weaknesses Behavioral/Psych: ( - ) mood change, ( - ) new changes  All other systems were reviewed with the patient and are negative.  PHYSICAL EXAMINATION: ECOG PERFORMANCE  STATUS: 0 - Asymptomatic  Vitals:   04/04/24 1430  BP: (!) 160/70  Pulse: 94  Resp: 16  Temp: 98 F (36.7 C)  SpO2: 93%    Filed Weights   04/04/24 1430  Weight: 205 lb 8 oz (93.2 kg)     GENERAL: well appearing female in NAD  SKIN: skin color, texture, turgor are normal, no rashes or significant lesions EYES: conjunctiva are pink and non-injected, sclera clear LUNGS: clear to auscultation and percussion with normal breathing effort HEART: regular rate & rhythm and no murmurs. Bilateral venous stasis erythema with edema.  Musculoskeletal: no cyanosis of digits and no clubbing  PSYCH: alert & oriented x 3, fluent speech NEURO: no focal motor/sensory deficits  LABORATORY DATA:  I have reviewed the data as listed    Latest Ref Rng & Units 04/04/2024    2:03 PM 09/24/2023   11:20 AM 04/28/2023    3:44 PM  CBC  WBC 4.0 -  10.5 K/uL 5.6  5.2  5.6   Hemoglobin 12.0 - 15.0 g/dL 87.9  86.8  86.2   Hematocrit 36.0 - 46.0 % 36.6  41.2  43.2   Platelets 150 - 400 K/uL 117  107  131.0        Latest Ref Rng & Units 04/04/2024    2:03 PM 12/16/2023   12:20 PM 09/24/2023   11:20 AM  CMP  Glucose 70 - 99 mg/dL 856   886   BUN 8 - 23 mg/dL 21   19   Creatinine 9.55 - 1.00 mg/dL 9.36   9.37   Sodium 864 - 145 mmol/L 142   140   Potassium 3.5 - 5.1 mmol/L 3.8   3.9   Chloride 98 - 111 mmol/L 108   106   CO2 22 - 32 mmol/L 27   31   Calcium  8.9 - 10.3 mg/dL 9.5   9.1   Total Protein 6.5 - 8.1 g/dL 6.6  6.1  6.4   Total Bilirubin 0.0 - 1.2 mg/dL 1.4  1.0  0.9   Alkaline Phos 38 - 126 U/L 64  90  92   AST 15 - 41 U/L 20  25  22    ALT 0 - 44 U/L 15  11  22      RADIOGRAPHIC STUDIES: I have personally reviewed the radiological images as listed and agreed with the findings in the report. MR KNEE RIGHT WO CONTRAST Result Date: 03/30/2024 CLINICAL DATA:  Right knee pain EXAM: MRI OF THE RIGHT KNEE WITHOUT CONTRAST TECHNIQUE: Multiplanar, multisequence MR imaging of the knee was  performed. No intravenous contrast was administered. COMPARISON:  None Available. FINDINGS: MENISCI Medial meniscus: Complex tears of the medial meniscal body and posterior horn. There is a flap of meniscal tissue displaced along the anterior margin of the posterior horn (series 10, image 22). Lateral meniscus: Irregular tear at the lateral meniscal posterior horn extending into the root attachment site. LIGAMENTS Cruciates: Intact ACL and PCL. Collaterals: Intact MCL. Lateral collateral ligament complex intact. CARTILAGE Patellofemoral: Moderate chondral thinning, most pronounced along the lateral trochlea and lateral patellar facet. Medial: Mild-moderate chondral thinning and irregularity along the weight-bearing surfaces of the medial compartment including a 2.5 mm near full-thickness chondral defect of the medial femoral condyle (series 8, image 22). Lateral: Chondral thinning with partial-thickness defect of the central weight-bearing lateral femoral condyle measuring up to 6 mm (series 8, image 19). MISCELLANEOUS Joint: Small knee joint effusion. No fluid-fluid level. Fat pads within normal limits. Popliteal Fossa:  Small Baker's cyst. Intact popliteus tendon. Extensor Mechanism:  Intact quadriceps and patellar tendons. Bones: Intense patchy bone marrow edema within the distal femur, notably in the distal metaphyseal region and medial femoral condyle as well as within the proximal tibial metaphysis and in the region of the tibial eminence. Marrow edema is also present in the region of the fibular neck. Suspect developing fibular neck fracture (series 7, image 14). Developing trabecular fractures in the distal femur and proximal tibia are suspected although not well defined at this time. No suspicious bone lesion. Other: Generalized soft tissue edema.  No fluid collections. IMPRESSION: 1. Intense patchy bone marrow edema within the distal femur, proximal tibia, and fibular neck. This is nonspecific and may be  related to stress and/or osteopenia. Suspect developing fibular neck fracture. Developing trabecular fractures in the distal femur and proximal tibia are suspected although not well defined at this time. 2. Complex tears of the medial and lateral  menisci. 3. Mild-to-moderate tricompartmental osteoarthritis. 4. Small knee joint effusion and small Baker's cyst. Electronically Signed   By: Mabel Converse D.O.   On: 03/30/2024 09:09   CT CHEST LUNG CA SCREEN LOW DOSE W/O CM Result Date: 03/29/2024 CLINICAL DATA:  78 year old female with 32 pack-year history of smoking. Lung cancer screening. EXAM: CT CHEST WITHOUT CONTRAST LOW-DOSE FOR LUNG CANCER SCREENING TECHNIQUE: Multidetector CT imaging of the chest was performed following the standard protocol without IV contrast. RADIATION DOSE REDUCTION: This exam was performed according to the departmental dose-optimization program which includes automated exposure control, adjustment of the mA and/or kV according to patient size and/or use of iterative reconstruction technique. COMPARISON:  03/16/2023 FINDINGS: Cardiovascular: The heart size is normal. No substantial pericardial effusion. Coronary artery calcification is evident. Moderate atherosclerotic calcification is noted in the wall of the thoracic aorta. Status post TAVR. Mediastinum/Nodes: No mediastinal lymphadenopathy. No evidence for gross hilar lymphadenopathy although assessment is limited by the lack of intravenous contrast on the current study. The esophagus has normal imaging features. There is no axillary lymphadenopathy. Lungs/Pleura: Centrilobular and paraseptal emphysema evident. Scattered tiny pulmonary nodules identified previously are stable in the interval. There is a new suprahilar right upper lobe pulmonary nodule measuring 5.4 mm on image 120. No focal airspace consolidation. No pleural effusion. Upper Abdomen: Nodular liver contour compatible with cirrhosis. Musculoskeletal: No worrisome lytic  or sclerotic osseous abnormality. Mild compression deformity noted at L3. IMPRESSION: 1. Lung-RADS 3, probably benign findings. Short-term follow-up in 6 months is recommended with repeat low-dose chest CT without contrast (please use the following order, CT CHEST LCS NODULE FOLLOW-UP W/O CM). New 5.4 mm right upper lobe pulmonary nodule. 2. Cirrhosis. 3. Aortic Atherosclerosis (ICD10-I70.0) and Emphysema (ICD10-J43.9). These results will be called to the ordering clinician or representative by the Radiologist Assistant, and communication documented in the PACS or [employer]. Electronically Signed   By: Camellia Candle M.D.   On: 03/29/2024 05:19   VAS US  LOWER EXTREMITY VENOUS (DVT) Result Date: 03/18/2024  Lower Venous DVT Study Patient Name:  Shannon Schultz  Date of Exam:   03/18/2024 Medical Rec #: [id number]      Accession #:    [id number] Date of Birth: June 15, 1946     Patient Gender: F Patient Age:   14 years Exam Location:  [address] Procedure:      VAS US  LOWER EXTREMITY VENOUS (DVT) Referring Phys: DICKEY SPRAGUE --------------------------------------------------------------------------------  Indications: Pain, and Edema right lower extremity.  Risk Factors: History of PE Surgery Right Greater Saphenous vein ablation 01/09/2022. Anticoagulation: Eliquis . Performing Technologist: Devere Dark RVT  Examination Guidelines: A complete evaluation includes B-mode imaging, spectral Doppler, color Doppler, and power Doppler as needed of all accessible portions of each vessel. Bilateral testing is considered an integral part of a complete examination. Limited examinations for reoccurring indications may be performed as noted. The reflux portion of the exam is performed with the patient in reverse Trendelenburg.  +---------+---------------+---------+-----------+----------+--------------+ RIGHT    CompressibilityPhasicitySpontaneityPropertiesThrombus Aging  +---------+---------------+---------+-----------+----------+--------------+ CFV      Full           Yes      Yes                                 +---------+---------------+---------+-----------+----------+--------------+ SFJ      Full           Yes      Yes                                 +---------+---------------+---------+-----------+----------+--------------+  FV Prox  Full           Yes      Yes                                 +---------+---------------+---------+-----------+----------+--------------+ FV Mid   Full           Yes      No                                  +---------+---------------+---------+-----------+----------+--------------+ FV DistalFull           Yes      Yes                                 +---------+---------------+---------+-----------+----------+--------------+ PFV      Full           Yes      Yes                                 +---------+---------------+---------+-----------+----------+--------------+ POP      Full           Yes      Yes                                 +---------+---------------+---------+-----------+----------+--------------+ PTV      Full           Yes      Yes                                 +---------+---------------+---------+-----------+----------+--------------+ PERO     Full           Yes      Yes                                 +---------+---------------+---------+-----------+----------+--------------+ Gastroc  Full           Yes      Yes                                 +---------+---------------+---------+-----------+----------+--------------+ GSV      ablated        -        -                                   +---------+---------------+---------+-----------+----------+--------------+ SSV      Full           Yes      Yes                                 +---------+---------------+---------+-----------+----------+--------------+    +----+---------------+---------+-----------+----------+--------------+ LEFTCompressibilityPhasicitySpontaneityPropertiesThrombus Aging +----+---------------+---------+-----------+----------+--------------+ CFV Full           Yes      Yes                                 +----+---------------+---------+-----------+----------+--------------+  Findings reported to Sarah at 1500.  Summary: RIGHT: - There is no evidence of deep vein thrombosis in the lower extremity. - There is no evidence of superficial venous thrombosis (right GSV ablation). - There is no evidence of deep vein thrombosis proximal to the inguinal ligament or in the common femoral vein.   *See table(s) above for measurements and observations. Electronically signed by Fonda Rim on 03/18/2024 at 3:32:30 PM.    Final     ASSESSMENT & PLAN Shannon Schultz is a 78 y.o. female who presents for a follow up for history of bilateral pulmonary emboli diagnosed in March 2023.  #Unprovoked Bilateral Pulmonary Emboli --findings at this time are consistent with a unprovoked VTE --No evidence of antiphospholipid syndrome based on testing from 10/16/2021 --Currently on eliquis  2.5 mg twice daily. Will continue indefinitely.  --patient denies any bleeding, bruising, or dark stools on this medication. It is well tolerated.  --The cost of the medication is now $0 for 64-month supply after $240 for the first 3 month supply of the year.  -- labs today show white blood cell 5.6, Hgb 12.0, MCV 85.9, Plt 117.  Additionally creatinine and liver function are within normal limits. --RTC in 12 months strict return precautions for overt signs of bleeding. (Yearly per patient request)   #Personal history of breast cancer and bladder cancer: --Patient underwent genetic testing in August 2015 for personal history and family history of breast cancer.  --currently in survivorship  #Leukopenia/Thrombocytopenia:  --Likely secondary to hepatic cirrhosis and  splenomegaly seen on CT imaging from 10/14/2021 --Monitor for now.   No orders of the defined types were placed in this encounter.   All questions were answered. The patient knows to call the clinic with any problems, questions or concerns.  I have spent a total of 25 minutes minutes of face-to-face and non-face-to-face time, preparing to see the patient,  performing a medically appropriate examination, counseling and educating the patient, documenting clinical information in the electronic health record, and care coordination.   Norleen IVAR Kidney, MD Department of Hematology/Oncology [facility] Phone: [phone] Pager: [phone] Email: [email or web address]

## 2024-04-01 ENCOUNTER — Other Ambulatory Visit: Payer: Self-pay | Admitting: Family Medicine

## 2024-04-02 ENCOUNTER — Encounter (HOSPITAL_COMMUNITY): Payer: Self-pay

## 2024-04-02 ENCOUNTER — Ambulatory Visit (HOSPITAL_COMMUNITY): Admission: EM | Admit: 2024-04-02 | Discharge: 2024-04-02 | Disposition: A | Discharge status: Home / Self Care

## 2024-04-02 DIAGNOSIS — S00432A Contusion of left ear, initial encounter: Secondary | ICD-10-CM | POA: Diagnosis not present

## 2024-04-02 HISTORY — DX: Other tear of medial meniscus, current injury, unspecified knee, initial encounter: S83.249A

## 2024-04-02 HISTORY — DX: Synovial cyst of popliteal space (Baker), unspecified knee: M71.20

## 2024-04-02 MED ORDER — NEOMYCIN-POLYMYXIN-HC 3.5-10000-1 OT SUSP: 4.0000 [drp] | Freq: Three times a day (TID) | OTIC | 0 refills | Status: DC

## 2024-04-02 NOTE — ED Triage Notes (Addendum)
 Patient c/o left ear pain and popping and cracking in the right ear since early this AM.  Patient has been using a heating pad for pain.

## 2024-04-02 NOTE — ED Provider Notes (Signed)
 [facility]    CSN: [id number] Arrival date & time: 04/02/24  1542      History   Chief Complaint Chief Complaint  Patient presents with   Otalgia    HPI Shannon Schultz is a 78 y.o. female.   Patient presents today due to sudden onset of left ear pain and reduced hearing since this morning.  Patient denies recent swimming, recent travel, otorrhea, recent head trauma, or dizziness.  Patient states that she has been taking Tylenol  for symptoms without relief of pain.   Otalgia   Past Medical History:  Diagnosis Date   Acute medial meniscus tear    Anxiety    Arthritis    hands (05/15/2017)   Baker's cyst    Bladder cancer (HCC) 2018   Breast cancer, right (HCC) 1992   DCIS,bladder ca (just dx)   Cirrhosis (HCC)    Colon polyp    Complication of anesthesia 1992   local anesthesia used was hard to awaken from-no problems since (05/15/2017)   COPD (chronic obstructive pulmonary disease) (HCC)    Coronary artery disease    COVID-19 virus infection 05/05/2019   Diverticulosis    Elevated liver enzymes    fatty liver per ultrasound per pt   FHx: BRCA2 gene positive    sister with BRCA2 mutation (pt tested NEGATIVE)   Fibromyalgia    Gastric ulcer    Heart murmur    faintly heard since TAVR 08-14-2020   HLD (hyperlipidemia)    HSV (herpes simplex virus) infection    on hip--on daily suppression   Hypertension    Hypothyroidism    took med 7 yrs after birth of 1st child   Impaired glucose tolerance    Migraine    Nonalcoholic steatohepatitis (NASH) 03/23/2022   On home oxygen  therapy    have it available but I'm not using it (05/15/2017)   Osteopenia    Personal history of breast cancer 11/15/2021   S/P TAVR (transcatheter aortic valve replacement) 08/14/2020   s/p TAVR with a 26 mm Edwards S3U via the left subclavian approach by Dr. Dusty and Dr. Verlin   Severe aortic stenosis    Severe obesity (BMI >= 40) (HCC) 10/06/2014   Sleep apnea     Sleeps with 2 L O2 @ night   Type 2 diabetes mellitus (HCC)    Urothelial cancer (HCC) 03/10/2017   Incidental bladder mass noted on CT, papillary urothelial CA on pathology from excision 12/2016; post-op instillation of chemo. Due for f/u cystoscopy 03/2017   Venous insufficiency 09/10/2018   Ventral hernia    Ventral hernia without obstruction or gangrene 05/15/2017   Vitamin D  deficiency disease     Patient Active Problem List   Diagnosis Date Noted   Mucosal abnormality of stomach 12/03/2023   Gastric ulcer without hemorrhage or perforation 07/20/2023   Positive fecal occult blood test 07/20/2023   Benign neoplasm of colon 07/20/2023   Type 2 diabetes mellitus with other specified complication (HCC) 11/22/2022   Hypertension associated with diabetes (HCC) 11/22/2022   Generalized anxiety disorder 11/22/2022   Eczema 11/22/2022   Gastroesophageal reflux disease without esophagitis 11/22/2022   COPD with chronic bronchitis and emphysema (HCC) 05/13/2022   COPD exacerbation (HCC) 03/24/2022   Acute on chronic respiratory failure with hypoxia (HCC) 03/23/2022   Nonalcoholic steatohepatitis (NASH) 03/23/2022   History of colonic polyps 03/23/2022   Diverticulosis of colon 03/23/2022   History of pulmonary embolus (PE) 03/19/2022   Personal history  of breast cancer 11/15/2021   Pulmonary embolism (HCC) 09/14/2021   Hemoptysis 09/13/2021   Pleural effusion 09/13/2021   Diastolic dysfunction 09/13/2021   URI (upper respiratory infection) 09/13/2021   Nocturnal hypoxemia 11/12/2020   S/P TAVR (transcatheter aortic valve replacement) 08/14/2020   Severe aortic stenosis    Contrast media allergy 06/26/2020   AAA (abdominal aortic aneurysm) without rupture 06/26/2020   Allergic rhinitis 04/27/2020   Upper airway cough syndrome 12/20/2019   Hip fracture (HCC) 08/25/2019   Venous insufficiency 09/10/2018   Sinusitis 06/25/2018   Ophthalmic migraine 03/16/2018   COPD, group B, by  GOLD 2017 classification (HCC) 09/30/2017   Chronic hypoxemic respiratory failure (HCC) 09/29/2017   Ventral hernia without obstruction or gangrene 05/15/2017   Hepatic cirrhosis (HCC) 03/11/2017   Urothelial cancer (HCC) 03/10/2017   Atherosclerosis of coronary artery 09/14/2016   Aortic atherosclerosis 09/14/2016   Class 3 severe obesity due to excess calories with serious comorbidity and body mass index (BMI) of 40.0 to 44.9 in adult (HCC) 11/10/2015   Thrombocytopenia 08/17/2015   Elevated troponin 08/17/2015   Hyperlipidemia associated with type 2 diabetes mellitus (HCC) 08/17/2015   COPD with acute exacerbation (HCC) 08/16/2015   Severe obesity (BMI >= 40) (HCC) 10/06/2014   Family history BRCA2 gene positive    HSV (herpes simplex virus) infection 06/07/2012   CAP (community acquired pneumonia) 06/06/2011   Pure hypercholesterolemia 11/13/2010   Essential hypertension, benign 11/13/2010    Past Surgical History:  Procedure Laterality Date   ABDOMINAL HERNIA REPAIR  05/15/2017   BIOPSY  07/20/2023   Procedure: BIOPSY;  Surgeon: Leigh Elspeth SQUIBB, MD;  Location: [facility];  Service: Gastroenterology;;   BIOPSY OF SKIN SUBCUTANEOUS TISSUE AND/OR MUCOUS MEMBRANE  12/03/2023   Procedure: BIOPSY, SKIN, SUBCUTANEOUS TISSUE, OR MUCOUS MEMBRANE;  Surgeon: Leigh Elspeth SQUIBB, MD;  Location: [facility];  Service: Gastroenterology;;   BREAST BIOPSY Right 1992   BREAST BIOPSY Left 2018   BREAST EXCISIONAL BIOPSY Left 2018   ATYPICAL DUCTAL HYPERPLASIA INVOLVING A COMPLEX   BREAST LUMPECTOMY WITH RADIOACTIVE SEED LOCALIZATION Left 12/22/2016   Procedure: LEFT BREAST LUMPECTOMY WITH RADIOACTIVE SEED LOCALIZATION;  Surgeon: Curvin Deward MOULD, MD;  Location: [facility] OR;  Service: General;  Laterality: Left;   CARDIAC CATHETERIZATION     CATARACT EXTRACTION, BILATERAL Bilateral R 03/16/2019 L 03/30/2019   Dr. Lelon   COLONOSCOPY  2009, 08/2010, 12/2015   Dr. Kristie; only 1 had any polyps  (05/15/2017)   COLONOSCOPY WITH PROPOFOL  N/A 07/20/2023   Procedure: COLONOSCOPY WITH PROPOFOL ;  Surgeon: Leigh Elspeth SQUIBB, MD;  Location: [facility];  Service: Gastroenterology;  Laterality: N/A;   CYSTOSCOPY  04/23/2017   CYSTOSCOPY W/ RETROGRADES Bilateral 01/12/2017   Procedure: CYSTOSCOPY WITH RETROGRADE PYELOGRAM/ EXAM UNDER ANESTHESIA;  Surgeon: Renda Glance, MD;  Location: [facility];  Service: Urology;  Laterality: Bilateral;   ENDOVENOUS ABLATION SAPHENOUS VEIN W/ LASER Right 01/09/2022   endovenous laser ablation right greater saphenous vein by Penne Colorado MD   ESOPHAGOGASTRODUODENOSCOPY N/A 12/03/2023   Procedure: EGD (ESOPHAGOGASTRODUODENOSCOPY);  Surgeon: Leigh Elspeth SQUIBB, MD;  Location: [facility];  Service: Gastroenterology;  Laterality: N/A;   ESOPHAGOGASTRODUODENOSCOPY (EGD) WITH PROPOFOL  N/A 07/20/2023   Procedure: ESOPHAGOGASTRODUODENOSCOPY (EGD) WITH PROPOFOL ;  Surgeon: Leigh Elspeth SQUIBB, MD;  Location: [facility];  Service: Gastroenterology;  Laterality: N/A;   EYE SURGERY Bilateral    Cataracts removed   FEMUR IM NAIL Right 08/24/2019   Procedure: INTRAMEDULLARY (IM) NAIL FEMORAL;  Surgeon: Fidel Rogue, MD;  Location:  [facility];  Service: Orthopedics;  Laterality: Right;   HERNIA REPAIR     INSERTION OF MESH N/A 05/15/2017   Procedure: INSERTION OF MESH;  Surgeon: Curvin Deward MOULD, MD;  Location: [facility] OR;  Service: General;  Laterality: N/A;   LAPAROSCOPIC CHOLECYSTECTOMY  2003   MASTECTOMY Right 1992   POLYPECTOMY  07/20/2023   Procedure: POLYPECTOMY;  Surgeon: Leigh Elspeth SQUIBB, MD;  Location: [facility];  Service: Gastroenterology;;   RIGHT/LEFT HEART CATH AND CORONARY ANGIOGRAPHY N/A 07/05/2020   Procedure: RIGHT/LEFT HEART CATH AND CORONARY ANGIOGRAPHY;  Surgeon: Verlin Lonni BIRCH, MD;  [facility];  Service: Cardiovascular;  Laterality: N/A;   TEE WITHOUT CARDIOVERSION N/A 08/14/2020   Procedure: TRANSESOPHAGEAL  ECHOCARDIOGRAM (TEE);  Surgeon: Verlin Lonni BIRCH, MD;  Location: [facility] OR;  Service: Open Heart Surgery;  Laterality: N/A;   TRANSURETHRAL RESECTION OF BLADDER TUMOR WITH MITOMYCIN -C N/A 01/12/2017   Procedure: TRANSURETHRAL RESECTION OF BLADDER TUMOR WITH POSSIBLE POST OPERATIVE INSTILLATION OF MITOMYCIN -C;  Surgeon: Renda Glance, MD;  Location: [facility];  Service: Urology;  Laterality: N/A;   TYMPANOSTOMY TUBE PLACEMENT Bilateral 1980s   UMBILICAL HERNIA REPAIR  2003   VENTRAL HERNIA REPAIR N/A 05/15/2017   Procedure: VENTRAL HERNIA REPAIR WITH MESH;  Surgeon: Curvin Deward MOULD, MD;  Location: [facility] OR;  Service: General;  Laterality: N/A;    OB History     Gravida  3   Para  3   Term      Preterm      AB      Living  3      SAB      IAB      Ectopic      Multiple      Live Births               Home Medications    Prior to Admission medications   Medication Sig Start Date End Date Taking? Authorizing Provider  ACCU-CHEK AVIVA PLUS test strip TEST BLOOD SUGAR EVERY DAY 12/15/22   Randol Dawes, MD  acetaminophen  (TYLENOL ) 500 MG tablet Take 500-1,000 mg by mouth every 6 (six) hours as needed for moderate pain (pain score 4-6) or headache.    [provider]  albuterol  (PROVENTIL ) (2.5 MG/3ML) 0.083% nebulizer solution Take 3 mLs (2.5 mg total) by nebulization every 6 (six) hours as needed for wheezing or shortness of breath. 03/10/22   Kassie Acquanetta Bradley, MD  albuterol  (VENTOLIN  HFA) 108 (90 Base) MCG/ACT inhaler Inhale 2 puffs into the lungs every 6 (six) hours as needed for wheezing or shortness of breath. 07/10/21   Kassie Acquanetta Bradley, MD  Alcohol  Swabs  (B-D SINGLE USE SWABS  REGULAR) PADS Use twice a day when checking blood sugars 12/21/19   Randol Dawes, MD  amoxicillin  (AMOXIL ) 500 MG capsule TAKE 4 CAPSULES BY MOUTH 1 HOUR BEFORE DENTAL PROCEDURE 03/08/24   Sebastian Lamarr SAUNDERS, PA-C  apixaban  (ELIQUIS ) 2.5 MG TABS tablet TAKE 1 TABLET TWICE DAILY 03/25/24   Kassie Acquanetta Bradley, MD  carvedilol  (COREG ) 6.25 MG tablet TAKE 1 TABLET TWICE DAILY WITH MEALS 03/17/24   Armbruster, Elspeth SQUIBB, MD  cetirizine (ZYRTEC) 10 MG tablet Take 10 mg by mouth daily.    [provider]  clobetasol cream (TEMOVATE) 0.05 % Apply 1 Application topically 2 (two) times daily as needed (eczema). 04/05/23   [provider]  escitalopram  (LEXAPRO ) 10 MG tablet TAKE 1 TABLET EVERY DAY 01/28/24   Randol Dawes, MD  guaiFENesin  (MUCINEX )  600 MG 12 hr tablet Take 600 mg by mouth at bedtime.    [provider]  hydrochlorothiazide  (HYDRODIURIL ) 12.5 MG tablet Take 1 tablet (12.5 mg total) by mouth daily. 12/11/23   Armbruster, Elspeth SQUIBB, MD  hydrocortisone cream 1 % Apply 1 Application topically 2 (two) times daily as needed for itching.    [provider]  Lancets Ultra Thin 30G MISC TEST BLOOD SUGAR EVERY DAY 04/01/24   Randol Dawes, MD  montelukast  (SINGULAIR ) 10 MG tablet TAKE 1 TABLET AT BEDTIME 11/26/23   Kassie Acquanetta Bradley, MD  Multiple Vitamins-Minerals (PRESERVISION AREDS 2 PO) Take 1 each by mouth 2 (two) times daily.    [provider]  neomycin -polymyxin-hydrocortisone (CORTISPORIN) 3.5-10000-1 OTIC suspension Place 4 drops into the left ear 3 (three) times daily. 04/02/24   Andra Corean BROCKS, PA-C  omeprazole  (PRILOSEC) 40 MG capsule TAKE 1 CAPSULE TWICE DAILY 09/14/23   Collier, Amanda R, PA-C  Semaglutide , 1 MG/DOSE, (OZEMPIC , 1 MG/DOSE,) 4 MG/3ML SOPN Inject 1 mg into the skin every Friday.    [provider]  simvastatin  (ZOCOR ) 20 MG tablet TAKE 1 TABLET AT BEDTIME 01/11/24   Randol Dawes, MD  TRELEGY ELLIPTA  100-62.5-25 MCG/ACT AEPB INHALE 1 PUFF INTO THE LUNGS DAILY. 07/20/23   Kassie Acquanetta Bradley, MD  triamcinolone  cream (KENALOG ) 0.1 % Apply 1 Application topically 2 (two) times daily as needed (eczema/dry skin).    [provider]  valACYclovir  (VALTREX ) 500 MG tablet TAKE 1 TABLET EVERY DAY 10/28/23   Randol Dawes, MD    Family  History Family History  Problem Relation Age of Onset   Heart disease Mother        tachycardia   Heart disease Father    Diabetes Father    Hypertension Father    Asthma Sister    Allergies Sister    Hyperlipidemia Sister    Hashimoto's thyroiditis Sister    Fibromyalgia Sister    Skin cancer Sister        leg; dx after 59; surgery only   Arthritis Sister    Hypertension Sister    Kidney disease Sister    Varicose Veins Sister    Asthma Sister    Allergies Sister    Hyperlipidemia Sister    Hashimoto's thyroiditis Sister    Fibromyalgia Sister    Arthritis Sister    Breast cancer Sister 53       BRCA2 positive; metastatic in late 74s   Arthritis Brother    Stomach cancer Maternal Grandmother    Cancer Maternal Grandmother        deceased 33; unk. primary; possibly stomach   Tuberculosis Maternal Grandfather    Stroke Paternal Grandmother 60       died of cerebral hemorrhage   ADD / ADHD Son    Depression Son    Other Niece        BRCA2 positive; prophylatic mastectomy   Cancer Other        unknown GYN cancers; MGM's sisters   Colon cancer Neg Hx    Esophageal cancer Neg Hx    Pancreatic cancer Neg Hx     Social History Social History   Tobacco Use   Smoking status: Former    Current packs/day: 0.00    Average packs/day: 1 pack/day for 46.0 years (46.0 ttl pk-yrs)    Types: Cigarettes    Start date: 05/30/1965    Quit date: 05/31/2011    Years since quitting: 12.8   Smokeless  tobacco: Never  Vaping Use   Vaping status: Never Used  Substance Use Topics   Alcohol  use: No   Drug use: No     Allergies   Contrast media [iodinated contrast media], Iohexol , Lisinopril, Sulfa antibiotics, Latex, Codeine, Levofloxacin, Lipitor [atorvastatin calcium ], Meloxicam, Nickel, and Vicodin [hydrocodone -acetaminophen ]   Review of Systems Review of Systems  HENT:  Positive for ear pain.      Physical Exam Triage Vital Signs ED Triage Vitals [04/02/24 1606]   Encounter Vitals Group     BP 136/77     Girls Systolic BP Percentile      Girls Diastolic BP Percentile      Boys Systolic BP Percentile      Boys Diastolic BP Percentile      Pulse Rate 94     Resp 16     Temp 98.5 F (36.9 C)     Temp Source Oral     SpO2 93 %     Weight      Height      Head Circumference      Peak Flow      Pain Score 10     Pain Loc      Pain Education      Exclude from Growth Chart    No data found.  Updated Vital Signs BP 136/77 (BP Location: Left Arm)   Pulse 94   Temp 98.5 F (36.9 C) (Oral)   Resp 16   LMP  (LMP Unknown)   SpO2 93%   Visual Acuity Right Eye Distance:   Left Eye Distance:   Bilateral Distance:    Right Eye Near:   Left Eye Near:    Bilateral Near:     Physical Exam Vitals and nursing note reviewed.  Constitutional:      General: She is not in acute distress.    Appearance: Normal appearance. She is not ill-appearing, toxic-appearing or diaphoretic.  HENT:     Ears:     Comments: Small hematoma noted of superior aspect of left ear canal, left TM appears normal Eyes:     General: No scleral icterus. Cardiovascular:     Rate and Rhythm: Normal rate and regular rhythm.     Heart sounds: Normal heart sounds.  Pulmonary:     Effort: Pulmonary effort is normal. No respiratory distress.     Breath sounds: Normal breath sounds. No wheezing or rhonchi.  Skin:    General: Skin is warm.  Neurological:     Mental Status: She is alert and oriented to person, place, and time.  Psychiatric:        Mood and Affect: Mood normal.        Behavior: Behavior normal.      UC Treatments / Results  Labs (all labs ordered are listed, but only abnormal results are displayed) Labs Reviewed - No data to display  EKG   Radiology No results found.  Procedures Procedures (including critical care time)  Medications Ordered in UC Medications - No data to display  Initial Impression / Assessment and Plan / UC Course  I  have reviewed the triage vital signs and the nursing notes.  Pertinent labs & imaging results that were available during my care of the patient were reviewed by me and considered in my medical decision making (see chart for details).     Hematoma of left ear canal-prescribed steroid containing eardrop in hopes of helping with your pain, referred patient to ENT for further  evaluation. Final Clinical Impressions(s) / UC Diagnoses   Final diagnoses:  Hematoma of ear, left, initial encounter   Discharge Instructions   None    ED Prescriptions     Medication Sig Dispense Auth. Provider   neomycin -polymyxin-hydrocortisone (CORTISPORIN) 3.5-10000-1 OTIC suspension  (Status: Discontinued) Place 4 drops into the left ear 3 (three) times daily. 10 mL Andra Krabbe C, PA-C   neomycin -polymyxin-hydrocortisone (CORTISPORIN) 3.5-10000-1 OTIC suspension Place 4 drops into the left ear 3 (three) times daily. 10 mL Andra Krabbe BROCKS, PA-C      PDMP not reviewed this encounter.   Andra Krabbe BROCKS, PA-C 04/02/24 1719

## 2024-04-04 ENCOUNTER — Inpatient Hospital Stay: Admitting: Hematology and Oncology

## 2024-04-04 ENCOUNTER — Inpatient Hospital Stay: Attending: Hematology and Oncology

## 2024-04-04 VITALS — BP 160/70 | HR 94 | Temp 98.0°F | Resp 16 | Wt 205.5 lb

## 2024-04-04 DIAGNOSIS — Z800 Family history of malignant neoplasm of digestive organs: Secondary | ICD-10-CM | POA: Diagnosis not present

## 2024-04-04 DIAGNOSIS — Z86711 Personal history of pulmonary embolism: Secondary | ICD-10-CM

## 2024-04-04 DIAGNOSIS — D72819 Decreased white blood cell count, unspecified: Secondary | ICD-10-CM | POA: Insufficient documentation

## 2024-04-04 DIAGNOSIS — D696 Thrombocytopenia, unspecified: Secondary | ICD-10-CM | POA: Diagnosis not present

## 2024-04-04 DIAGNOSIS — Z8551 Personal history of malignant neoplasm of bladder: Secondary | ICD-10-CM | POA: Diagnosis not present

## 2024-04-04 DIAGNOSIS — Z87891 Personal history of nicotine dependence: Secondary | ICD-10-CM | POA: Diagnosis not present

## 2024-04-04 DIAGNOSIS — Z853 Personal history of malignant neoplasm of breast: Secondary | ICD-10-CM | POA: Insufficient documentation

## 2024-04-04 DIAGNOSIS — Z808 Family history of malignant neoplasm of other organs or systems: Secondary | ICD-10-CM | POA: Insufficient documentation

## 2024-04-04 DIAGNOSIS — H73891 Other specified disorders of tympanic membrane, right ear: Secondary | ICD-10-CM | POA: Diagnosis not present

## 2024-04-04 DIAGNOSIS — I2699 Other pulmonary embolism without acute cor pulmonale: Secondary | ICD-10-CM | POA: Diagnosis not present

## 2024-04-04 DIAGNOSIS — Z86718 Personal history of other venous thrombosis and embolism: Secondary | ICD-10-CM | POA: Diagnosis not present

## 2024-04-04 DIAGNOSIS — Z79899 Other long term (current) drug therapy: Secondary | ICD-10-CM | POA: Diagnosis not present

## 2024-04-04 DIAGNOSIS — Z7901 Long term (current) use of anticoagulants: Secondary | ICD-10-CM | POA: Insufficient documentation

## 2024-04-04 DIAGNOSIS — Z809 Family history of malignant neoplasm, unspecified: Secondary | ICD-10-CM | POA: Diagnosis not present

## 2024-04-04 DIAGNOSIS — Z803 Family history of malignant neoplasm of breast: Secondary | ICD-10-CM | POA: Diagnosis not present

## 2024-04-04 DIAGNOSIS — H6993 Unspecified Eustachian tube disorder, bilateral: Secondary | ICD-10-CM | POA: Diagnosis not present

## 2024-04-04 DIAGNOSIS — H9222 Otorrhagia, left ear: Secondary | ICD-10-CM | POA: Diagnosis not present

## 2024-04-04 LAB — CBC WITH DIFFERENTIAL (CANCER CENTER ONLY)
Abs Immature Granulocytes: 0.01 K/uL (ref 0.00–0.07)
Basophils Absolute: 0.1 K/uL (ref 0.0–0.1)
Basophils Relative: 1 %
Eosinophils Absolute: 0.1 K/uL (ref 0.0–0.5)
Eosinophils Relative: 1 %
HCT: 36.6 % (ref 36.0–46.0)
Hemoglobin: 12 g/dL (ref 12.0–15.0)
Immature Granulocytes: 0 %
Lymphocytes Relative: 17 %
Lymphs Abs: 1 K/uL (ref 0.7–4.0)
MCH: 28.2 pg (ref 26.0–34.0)
MCHC: 32.8 g/dL (ref 30.0–36.0)
MCV: 85.9 fL (ref 80.0–100.0)
Monocytes Absolute: 0.5 K/uL (ref 0.1–1.0)
Monocytes Relative: 9 %
Neutro Abs: 4 K/uL (ref 1.7–7.7)
Neutrophils Relative %: 72 %
Platelet Count: 117 K/uL — ABNORMAL LOW (ref 150–400)
RBC: 4.26 MIL/uL (ref 3.87–5.11)
RDW: 15 % (ref 11.5–15.5)
WBC Count: 5.6 K/uL (ref 4.0–10.5)
nRBC: 0 % (ref 0.0–0.2)

## 2024-04-04 LAB — CMP (CANCER CENTER ONLY)
ALT: 15 U/L (ref 0–44)
AST: 20 U/L (ref 15–41)
Albumin: 3.8 g/dL (ref 3.5–5.0)
Alkaline Phosphatase: 64 U/L (ref 38–126)
Anion gap: 7 (ref 5–15)
BUN: 21 mg/dL (ref 8–23)
CO2: 27 mmol/L (ref 22–32)
Calcium: 9.5 mg/dL (ref 8.9–10.3)
Chloride: 108 mmol/L (ref 98–111)
Creatinine: 0.63 mg/dL (ref 0.44–1.00)
GFR, Estimated: >60 mL/min (ref >60)
Glucose, Bld: 143 mg/dL — ABNORMAL HIGH (ref 70–99)
Potassium: 3.8 mmol/L (ref 3.5–5.1)
Sodium: 142 mmol/L (ref 135–145)
Total Bilirubin: 1.4 mg/dL — ABNORMAL HIGH (ref 0.0–1.2)
Total Protein: 6.6 g/dL (ref 6.5–8.1)

## 2024-04-04 MED ORDER — APIXABAN 2.5 MG PO TABS: 2.5000 mg | ORAL_TABLET | Freq: Two times a day (BID) | ORAL | 3 refills | Status: AC

## 2024-04-04 NOTE — Progress Notes (Signed)
 Otolaryngology Clinic Note  HPI:    New Patient (Pt presents for hematoma in left ear noticed on Saturday)    Shannon Schultz is a 78 y.o. female who presents as a new patient for evaluation and treatment of  left ear popping and cracking.  She has been experiencing intermittent popping and cracking sounds in her left ear for several days, with similar but less severe symptoms in her right ear. On 04/02/2024, she woke up with significant pain in her left ear, prompting a visit to urgent care that afternoon. At urgent care, she was diagnosed with a hematoma in her ear, despite no history of trauma or falls. She uses Q-tips for ear cleaning once a week but does not insert them deeply. She reports no clicking or popping sounds when opening or closing her mouth. She has a history of ear tubes due to crooked eustachian tubes. She is currently on blood thinners. She was prescribed Cortisporin drops, which she started using on 04/02/2024 and continued through the night. After each application, she noticed blood on the cotton swab.   Medical History[1]  Surgical History[2]  No family history of bleeding disorders, wound healing problems or difficulty with anesthesia.   Social History   Socioeconomic History  . Marital status: Married    Spouse name: Not on file  . Number of children: Not on file  . Years of education: Not on file  . Highest education level: Not on file  Occupational History  . Not on file  Tobacco Use  . Smoking status: Former    Types: Cigarettes  . Smokeless tobacco: Never  Substance and Sexual Activity  . Alcohol  use: Never  . Drug use: Not on file  . Sexual activity: Not on file  Other Topics Concern  . Not on file  Social History Narrative  . Not on file   Social Drivers of Health   Food Insecurity: No Food Insecurity (11/20/2022)   Received from [facility]   Food vital sign   . Within the past 12 months, you worried that your food would run out before you  got money to buy more: Never true   . Within the past 12 months, the food you bought just didn't last and you didn't have money to get more: Never true  Transportation Needs: No Transportation Needs (11/20/2022)   Received from [facility] - Transportation   . Lack of Transportation (Medical): No   . Lack of Transportation (Non-Medical): No  Safety: Unknown (11/20/2022)   Received from [facility]   Safety   . Within the last year, have you been afraid of your partner or ex-partner?: No   . Within the last year, have you been humiliated or emotionally abused in other ways by your partner or ex-partner?: No   . Physically Abused: Not on file   . Sexually Abused: Not on file  Living Situation: Not on file    Current Medications[3]  A complete ROS was performed with pertinent positives/negatives noted in the HPI. The remainder of the ROS are negative.    Physical Exam:    BP 128/76   Pulse 80   Temp 97.3 F (36.3 C) (Temporal)   Ht 1.6 m (5' 3)   Wt 93.1 kg (205 lb 4.8 oz)   BMI 36.37 kg/m    General: Well developed, well nourished. No acute distress.   Head/Face: Normocephalic, atraumatic. No scars or lesions. No sinus tenderness. Facial nerve intact and equal  bilaterally.   Eyes: Pupils are equal, round and reactive to light. Conjunctiva and lids are normal. Normal extraocular mobility.   Ears:   Right: Pinna and external meatus normal, normal ear canal skin and caliber without excessive cerumen or drainage. Tympanic membrane intact without effusion or infection.  Significant retraction pocket noted.   Left: Pinna and external meatus normal, dried blood with canal edema noted, with palpable fluctuance consistent with hematoma formation, likely secondary to canal trauma.  No evidence of acute infection, no purulence. Tympanic membrane intact without effusion or infection.   Nose: No gross deformity or lesions. No purulent discharge.   Respiratory: No stridor or  distress.  Extremities: No edema or cyanosis. Warm and well-perfused.  Neurologic: CN II-XII intact. Alert and oriented to self, place and time.  Normal reflexes and motor skills, balance and coordination. Moving all extremities without gross abnormality.  Psychiatric:  No unusual anxiety or evidence of depression. Appropriate affect.    Independent Review of Additional Tests or Records:  None  Procedures:   Binocular Microscopy  Date/Time: 04/04/2024 10:45 AM  Performed by: Gerard Jenkins Shope, DO Authorized by: Gerard Jenkins Shope, DO   Consent:    Consent obtained:  Verbal   Consent given by:  Patient Procedure details:    Indications: examination of tympanic membrane     Scope location: bilateral   Ear Cerumen Removal  Date/Time: 04/04/2024 10:45 AM  Performed by: Gerard Jenkins Shope, DO Authorized by: Gerard Jenkins Shope, DO   Procedure Details:  Impacted cerumen: No  Cerumen removal location: left Procedure type: (suction)  Post Procedure Details:  Procedure findings: ear canal clear Patient tolerance of procedure: patient tolerated the procedure without difficulty Comments: CERUMEN REMOVAL The risks and benefits of this procedure have been thoroughly discussed with the patient/parent.  The most commons risks outlined included but were not limited to: injury of the ear canal or tympanic membrane.  The patient/parent was further informed that there are other less common risks.  The patient/parent  was given the opportunity to ask questions and all such questions were answered to the patient/parent's satisfaction.  Patient/parent  acknowledged the risks and has agreed to proceed.  Procedure: Patient was laid supine and using the microscope, cerumen and blood was removed from left  external auditory canal(s) using suction and various instrumentation including wax currettes and suctions.  Tolerance: excellent Unplanned interventions:  none Unplanned events:  no  complications       Impression & Plans:  [name] is a 78 y.o. female chronically anticoagulated on Eliquis  presenting for evaluation of possible hematoma in the left ear.  Examination today including otomicroscopy demonstrates evidence of canal trauma, with small hematoma of the external auditory canal, no evidence of infection.  Tympanic membrane appears healthy.  Recommend cessation of Cortisporin eardrops, new prescription for Ciprodex drops sent to patient's pharmacy.  Instructions for use were reviewed with patient.  Significant retraction pocket was noted in the right ear, patient reports longstanding history of eustachian tube dysfunction requiring multiple sets of ear tubes.  Patient will return in approximately 1 month for recheck, with audiogram to be performed at that time.  Further recommendations pending response to treatment and results of additional testing.  Meghan Jenkins Shope, DO Otolaryngology         [1] History reviewed. No pertinent past medical history. [2] History reviewed. No pertinent surgical history. [3]  Current Outpatient Medications:  .  carvediloL  (COREG ) 6.25 mg tablet, , Disp: , Rfl:  .  clobetasoL (TEMOVATE) 0.05 % cream, , Disp: , Rfl:  .  Eliquis  2.5 mg tab, , Disp: , Rfl:  .  escitalopram  (LEXAPRO ) 10 mg tablet, , Disp: , Rfl:  .  hydroCHLOROthiazide  (HYDRODIURIL ) 12.5 mg tablet, , Disp: , Rfl:  .  montelukast  (SINGULAIR ) 10 mg tablet, , Disp: , Rfl:  .  omeprazole  (PriLOSEC) 40 mg DR capsule, , Disp: , Rfl:  .  simvastatin  (ZOCOR ) 20 mg tablet, , Disp: , Rfl:  .  Trelegy Ellipta  100-62.5-25 mcg inhaler, , Disp: , Rfl:  .  TRUEplus Lancets 30 gauge misc, , Disp: , Rfl:  .  valACYclovir  (VALTREX ) 500 mg tablet, , Disp: , Rfl:  .  ofloxacin (FLOXIN) 0.3 % otic solution, Administer 4 drops into left ear 2 (two) times a day for 3 days., Disp: 10 mL, Rfl: 0

## 2024-04-10 ENCOUNTER — Other Ambulatory Visit: Payer: Self-pay | Admitting: Family Medicine

## 2024-04-10 DIAGNOSIS — F411 Generalized anxiety disorder: Secondary | ICD-10-CM

## 2024-04-11 ENCOUNTER — Telehealth: Payer: Self-pay

## 2024-04-11 ENCOUNTER — Telehealth (HOSPITAL_BASED_OUTPATIENT_CLINIC_OR_DEPARTMENT_OTHER): Payer: Self-pay

## 2024-04-11 NOTE — Telephone Encounter (Signed)
 Filled and faxed provider portion [employer] Ozempic  to provider office to sign and date can be fax to [employer] or back to [phone]

## 2024-04-11 NOTE — Telephone Encounter (Signed)
 Spoke with Avelina at [employer] and they will get this sent out to pt urgently. Pt notified    Copied from CRM [id number]. Topic: Clinical - Request for Lab/Test Order >> Apr 11, 2024  8:34 AM Ismael A wrote: Reason for CRM: patient states she had oxygen  test done in office on 03/16/24 and was told she needed to have a home oxygen  test done next. She states the doctor ordered her to have it done by October or they would cancel her oxygen , but she has not received the kit yet. Please follow up ph# [phone]

## 2024-04-11 NOTE — Progress Notes (Signed)
   04/11/2024  Patient ID: Shannon Schultz, female   DOB: 1945-07-21, 78 y.o.   MRN: [id number]  Patient called in to discuss the ending of the Ozempic  program for medicare patients. Confirmed this is ending and to continue medication through 2025. Discussed that we can send an rx for 2026 at start of year to see if affordable through insurance.  Patient expressed understanding. Does need a new refill to finish out the year.  Jon VEAR Lindau, PharmD Clinical Pharmacist [phone]

## 2024-04-12 DIAGNOSIS — G4730 Sleep apnea, unspecified: Secondary | ICD-10-CM | POA: Diagnosis not present

## 2024-04-12 DIAGNOSIS — R0902 Hypoxemia: Secondary | ICD-10-CM | POA: Diagnosis not present

## 2024-04-12 NOTE — Telephone Encounter (Signed)
 Received provider portion [employer] Ozempic, faxed to [employer] today.

## 2024-04-13 DIAGNOSIS — M1711 Unilateral primary osteoarthritis, right knee: Secondary | ICD-10-CM | POA: Diagnosis not present

## 2024-04-13 DIAGNOSIS — M25561 Pain in right knee: Secondary | ICD-10-CM | POA: Diagnosis not present

## 2024-04-14 ENCOUNTER — Encounter: Payer: Self-pay | Admitting: Pulmonary Disease

## 2024-04-14 ENCOUNTER — Ambulatory Visit: Payer: Self-pay | Admitting: Pulmonary Disease

## 2024-04-14 DIAGNOSIS — G4734 Idiopathic sleep related nonobstructive alveolar hypoventilation: Secondary | ICD-10-CM

## 2024-04-14 NOTE — Progress Notes (Signed)
 Order placed

## 2024-04-14 NOTE — Progress Notes (Signed)
 Overnight Oximetry Results Date: 04/12/24 SpO2 <88% 0 hour 5 min 52 sec.  [place] SpO2 86%  Borderline qualified for oxygen   Assessment/Plan Borderline Nocturnal Hypoxemia --Discussed with patient and ok with discontinuing oxygen  --DISCONTINUE oxygen 

## 2024-04-20 ENCOUNTER — Ambulatory Visit: Payer: Self-pay | Admitting: Gastroenterology

## 2024-04-20 ENCOUNTER — Ambulatory Visit (HOSPITAL_COMMUNITY)
Admission: RE | Admit: 2024-04-20 | Discharge: 2024-04-20 | Disposition: A | Discharge status: Home / Self Care | Source: Ambulatory Visit | Attending: Gastroenterology | Admitting: Gastroenterology

## 2024-04-20 DIAGNOSIS — K7689 Other specified diseases of liver: Secondary | ICD-10-CM | POA: Diagnosis not present

## 2024-04-20 DIAGNOSIS — I8500 Esophageal varices without bleeding: Secondary | ICD-10-CM | POA: Insufficient documentation

## 2024-04-20 DIAGNOSIS — Z8711 Personal history of peptic ulcer disease: Secondary | ICD-10-CM | POA: Diagnosis not present

## 2024-04-20 DIAGNOSIS — K259 Gastric ulcer, unspecified as acute or chronic, without hemorrhage or perforation: Secondary | ICD-10-CM | POA: Diagnosis not present

## 2024-04-20 DIAGNOSIS — Z9049 Acquired absence of other specified parts of digestive tract: Secondary | ICD-10-CM | POA: Diagnosis not present

## 2024-04-20 DIAGNOSIS — K7460 Unspecified cirrhosis of liver: Secondary | ICD-10-CM | POA: Insufficient documentation

## 2024-04-28 NOTE — Telephone Encounter (Signed)
 Pt informed PAP Ozempic  received

## 2024-05-11 ENCOUNTER — Encounter: Payer: Self-pay | Admitting: Family Medicine

## 2024-05-11 ENCOUNTER — Other Ambulatory Visit (HOSPITAL_BASED_OUTPATIENT_CLINIC_OR_DEPARTMENT_OTHER): Payer: Self-pay | Admitting: Pulmonary Disease

## 2024-05-11 DIAGNOSIS — H90A32 Mixed conductive and sensorineural hearing loss, unilateral, left ear with restricted hearing on the contralateral side: Secondary | ICD-10-CM | POA: Diagnosis not present

## 2024-05-11 DIAGNOSIS — H6592 Unspecified nonsuppurative otitis media, left ear: Secondary | ICD-10-CM | POA: Diagnosis not present

## 2024-05-11 DIAGNOSIS — H6993 Unspecified Eustachian tube disorder, bilateral: Secondary | ICD-10-CM | POA: Diagnosis not present

## 2024-05-11 DIAGNOSIS — H6992 Unspecified Eustachian tube disorder, left ear: Secondary | ICD-10-CM | POA: Diagnosis not present

## 2024-05-11 DIAGNOSIS — H73891 Other specified disorders of tympanic membrane, right ear: Secondary | ICD-10-CM | POA: Diagnosis not present

## 2024-05-11 HISTORY — PX: MYRINGOTOMY WITH TUBE PLACEMENT: SHX5663

## 2024-05-13 DIAGNOSIS — M25551 Pain in right hip: Secondary | ICD-10-CM | POA: Diagnosis not present

## 2024-05-13 DIAGNOSIS — M25561 Pain in right knee: Secondary | ICD-10-CM | POA: Diagnosis not present

## 2024-05-13 DIAGNOSIS — M25552 Pain in left hip: Secondary | ICD-10-CM | POA: Diagnosis not present

## 2024-06-06 ENCOUNTER — Other Ambulatory Visit: Payer: Self-pay | Admitting: Family Medicine

## 2024-06-06 DIAGNOSIS — E7800 Pure hypercholesterolemia, unspecified: Secondary | ICD-10-CM

## 2024-06-06 DIAGNOSIS — B009 Herpesviral infection, unspecified: Secondary | ICD-10-CM

## 2024-06-07 ENCOUNTER — Encounter: Payer: Self-pay | Admitting: Family Medicine

## 2024-06-07 NOTE — Patient Instructions (Incomplete)
 HEALTH MAINTENANCE RECOMMENDATIONS:  It is recommended that you get at least 30 minutes of aerobic exercise at least 5 days/week (for weight loss, you may need as much as 60-90 minutes). This can be any activity that gets your heart rate up. This can be divided in 10-15 minute intervals if needed, but try and build up your endurance at least once a week.  Weight bearing exercise is also recommended twice weekly.  Eat a healthy diet with lots of vegetables, fruits and fiber.  Colorful foods have a lot of vitamins (ie green vegetables, tomatoes, red peppers, etc).  Limit sweet tea, regular sodas and alcoholic beverages, all of which has a lot of calories and sugar.  Up to 1 alcoholic drink daily may be beneficial for women (unless trying to lose weight, watch sugars).  Drink a lot of water.  Calcium  recommendations are 1200-1500 mg daily (1500 mg for postmenopausal women or women without ovaries), and vitamin D  1000 IU daily.  This should be obtained from diet and/or supplements (vitamins), and calcium  should not be taken all at once, but in divided doses.  Monthly self breast exams and yearly mammograms for women over the age of 9 is recommended.  Sunscreen of at least SPF 30 should be used on all sun-exposed parts of the skin when outside between the hours of 10 am and 4 pm (not just when at beach or pool, but even with exercise, golf, tennis, and yard work!)  Use a sunscreen that says broad spectrum so it covers both UVA and UVB rays, and make sure to reapply every 1-2 hours.  Remember to change the batteries in your smoke detectors when changing your clock times in the spring and fall. Carbon monoxide detectors are recommended for your home.  Use your seat belt every time you are in a car, and please drive safely and not be distracted with cell phones and texting while driving.   Shannon Schultz , Thank you for taking time to come for your Medicare Wellness Visit. I appreciate your ongoing  commitment to your health goals. Please review the following plan we discussed and let me know if I can assist you in the future.   This is a list of the screening recommended for you and due dates:  Health Maintenance  Topic Date Due   Flu Shot  01/29/2024   COVID-19 Vaccine (6 - 2025-26 season) 02/29/2024   Medicare Annual Wellness Visit  06/02/2024   Complete foot exam   06/02/2024   Hemoglobin A1C  06/16/2024   Eye exam for diabetics  12/07/2024   Yearly kidney health urinalysis for diabetes  12/15/2024   Breast Cancer Screening  12/16/2024   Screening for Lung Cancer  03/18/2025   Yearly kidney function blood test for diabetes  04/04/2025   Colon Cancer Screening  07/19/2030   DTaP/Tdap/Td vaccine (4 - Td or Tdap) 08/26/2033   Pneumococcal Vaccine for age over 52  Completed   Hepatitis C Screening  Completed   Zoster (Shingles) Vaccine  Completed   Meningitis B Vaccine  Aged Out   Hepatitis B Vaccine  Discontinued   You are due for a bone density test, to follow up on thinning of the bones. It is important to get adequate calcium  and vitamin D  daily, as well as regular resistance training/weight bearing exercise. This is important to help protect your bones, but also to keep from losing muscle mass while on the Ozempic . It is important that you get adequate protein  intake daily as well.

## 2024-06-07 NOTE — Progress Notes (Unsigned)
 No chief complaint on file.   Shannon Schultz is a 78 y.o. female who presents for annual physical exam, Medicare wellness visit (see separate note) and follow-up on chronic medical conditions.     She continues to see Dr. Arnaldo. She fractured her right foot (5th metatarsal) in August, saw him for f/u in September, and in late September she had severe knee pain. She was treated with prednisone  and pain meds (hydrocodone ; pt unable to take NSAIDs due to being anticoagulated). Pain has significantly improved.   MRI R knee 03/29/24: IMPRESSION: 1. Intense patchy bone marrow edema within the distal femur, proximal tibia, and fibular neck. This is nonspecific and may be related to stress and/or osteopenia. Suspect developing fibular neck fracture. Developing trabecular fractures in the distal femur and proximal tibia are suspected although not well defined at this time. 2. Complex tears of the medial and lateral menisci. 3. Mild-to-moderate tricompartmental osteoarthritis. 4. Small knee joint effusion and small Baker's cyst.  She has chronic issues with trochanteric bursitis. She reported discomfort was tolerable, declined treatment (PT and injections were discussed).  She saw ENT in October for hematoma in L ear.  She was seen a month later for f/u, with decreased hearing in R. She has h/o chronic eustachian tube dysfunction with many ear tubes in her lifetime.  She had  L myringotomy tube placed 05/11/24 by Dr. Llewellyn.  Diabetes: She continues on ozempic  1 mg.  Metformin  ER 500 mg daily was stopped at her last visit in June.  Last A1c was 5.2 in 11/2023.   We had kept the Ozempic  dose at 1 mg due to good weight loss (we had discussed potential to titrate up to 2mg ). She has continued to lose weight.  She denies side effects (rare nausea; no vomiting or constipation).   She continues to eat smaller portions.   Blood sugars are running ***  Denies hypoglycemia, polydipsia and polyuria.   Last diabetic eye exam was 11/2022. *** Patient checks feet regularly without concerns.  Denies numbness, tingling (other than intermittent numbness in the 2nd and 3rd toes on the right foot, related to h/o fractures, didn't heal right, per pt). No skin lesions.  Microalbuminuria has been noted over the last year. She has h/o rash/SOB with lisinopril in the past.  Component Ref Range & Units (hover) 5 mo ago 1 yr ago 2 yr ago 3 yr ago 4 yr ago 5 yr ago 6 yr ago  Creatinine, Urine 131.8 51.7 39.4 83.9 103.4 66.6 82.4  Microalbumin, Urine 51.1 16.9 6.1 9.9 14.2 9.0 13.0  Microalb/Creat Ratio 39 High  33 High  CM 15 CM 12 CM 14 CM 14 CM 15.8 R, CM    Hypertension: Bisoprolol  was changed to carvedilol  (low dose) and hydrochlorothiazide  (per recommendation from Dr. Leigh due to esophageal varices).   BP's have been running ***   Denies headaches, chest pain or dizziness.  Denies any edema.   BP Readings from Last 3 Encounters:  04/04/24 (!) 160/70  04/02/24 136/77  03/16/24 (!) 142/81     H/o aortic stenosis--she underwent TAVR 07/2020.  She denies syncope, angina, dyspnea (at baseline).  She was noted to have mild nonobstructive CAD on cardiac cath.  Last echo was 01/2024.  Ejection fraction of 55 to 60%. The TAVR valve has mild stenosis with a mean gradient of 19.5 mmHg.  This was a little higher than it was 2 years ago but only just slightly higher than it was in February.  Dr. JAYSON recommended repeat echo in 6 months.    H/o gastric ulcer. She underwent repeat EGD in June 2025. This showed healing ulcers, 2cm HH. Also showed esophageal varices and candidiasis (which was treated with fluconazole ). She continues to take omeprazole  twice daily. She denies abdominal pain, hematochezia, melena. Denies dysphagia, sore throat, heartburn, chest pain.  Cirrhosis:  CT 10/2019 revealed hepatic cirrhosis and findings of portal venous hypertension. No evidence of ascites. She previously saw Dr. Kristie,  now under the care of Dr. Leigh.  CT abdomen/pelvis on 04/29/23 showed: IMPRESSION: 1. Distal colonic diverticulosis with no evidence of acute diverticulitis. 2. Cirrhosis, with portal venous hypertension manifested by splenomegaly and upper abdominal varices. 3.  Aortic Atherosclerosis (ICD10-I70.0).  She had liver US  in October 2025, showing stable changes of cirrhosis.  F/u US  to be done in April (6 mos).    Hyperlipidemia follow-up: Patient is reportedly following a low-fat, low cholesterol diet. Compliant with simvastatin  and denies medication side effects.  She has known aortic atherosclerosis.   Lipids were above goal on last check, likely related to having taken lower dose of the simvastatin  while taking fluconazole .   Component Ref Range & Units (hover) 5 mo ago 1 yr ago 2 yr ago 3 yr ago 4 yr ago 5 yr ago 6 yr ago  Cholesterol, Total 139 113 118 130 116 111 108  Triglycerides 70 67 70 86 101 99 96  HDL 51 54 54 54 54 46 51  VLDL Cholesterol Cal 14 14 15 17 19 20 19   LDL Chol Calc (NIH) 74 45 49 59 43    Chol/HDL Ratio 2.7 2.1 CM 2.2 CM 2.4 CM 2.1 CM 2.4 CM 2.1 CM    COPD: She remains under the care of Dr. Kassie, without any recent exacerbations.  She is compliant with daily use of Trelegy. Rarely needs albuterol . Denies cough, wheezing. Exercise is limited.   She had been using oxygen  at night (preferred over using CPAP).  She underwent an overnight oximetry study in 03/2024, which showed borderline need for oxygen  at night. Was advised by Dr. Kassie that she could stop the oxygen .    She is getting regular lung cancer screening with CT. Last CT was in 02/2024, new nodule noted. 6 month f/u CT recommended. IMPRESSION: 1. Lung-RADS 3, probably benign findings. Short-term follow-up in 6 months is recommended with repeat low-dose chest CT without contrast (please use the following order, CT CHEST LCS NODULE FOLLOW-UP W/O CM). New 5.4 mm right upper lobe pulmonary  nodule. 2. Cirrhosis. 3. Aortic Atherosclerosis (ICD10-I70.0) and Emphysema (ICD10-J43.9).    H/o bilateral pulmonary embolisms, unprovoked. Lifelong anticoagulation is recommended. Compliant with Eliquis . She denies bleeding, chest pain, or pain with breathing. She sees Dr. Federico yearly, last in October.   Anxiety:  She was started on lexapro  in 12/2021 when feeling very overwhelmed/anxious about her health issues (COPD, dry macular degeneration).  She has been doing very well on the lexapro .  She reports that her moods are good, not feeling anxious.  Hasn't taken alprazolam .    Eczema: Under the care of [facility], using clobetasol cream and Eucerin cream.  She still has flares--improving on her stomach, still has at lower back, L elbow. ***UPDATE She has some on her scalp, not too bothersome now (itches more when hair is dirty).   Venous insufficiency and edema:   She is s/p laser ablation on the RLE and was told she didn't need surgery on the left. She continues  to wear compression socks on both legs, most days. She no longer takes lasix  daily, just as needed.   She denies any skin lesions/wounds or weeping.    H/o bladder cancer--lesion in bladder was noted incidentally on CT 11/2016 prior to hernia surgery. Pathology showed low grade papillary urothelial CA, and she was treated with intravesicular chemo.  She gets yearly cystoscopy. Last urology visit was 05/2023 with Dr. Lawton.  Osteoporosis/osteopenia:  h/o hip fracture. Last DEXA: 09/2021 T-2.0 at L forearm radius.  -1.8 at L fem neck.     Component Ref Range & Units (hover) 4 yr ago 13 yr ago  Vit D, 25-Hydroxy 34.27 69 R, CM     She takes Valtrex  preventatively for HSV flare on hip.  At one point, after she got her shingles vaccine, her flares decreased, and she changed to prn dosing.  She ultimately restarted taking it daily because she had outbreaks recur regularly. Denies any outbreaks.    Immunization History   Administered Date(s) Administered    sv, Bivalent, Protein Subunit Rsvpref,pf (Abrysvo) 05/20/2022   Fluad Quad(high Dose 65+) 04/07/2019, 04/26/2020, 05/08/2021, 03/25/2022   Fluad Trivalent(High Dose 65+) 04/14/2023   Hepb-cpg 11/02/2023, 12/04/2023   INFLUENZA, HIGH DOSE SEASONAL PF 04/07/2013, 05/15/2014, 05/30/2015, 03/25/2016, 03/05/2017, 04/01/2018   Influenza Split 05/28/2011   Influenza, Seasonal, Injecte, Preservative Fre 06/07/2012   PFIZER Comirnaty(Gray Top)Covid-19 Tri-Sucrose Vaccine 10/25/2020   PFIZER(Purple Top)SARS-COV-2 Vaccination 08/20/2019, 09/13/2019, 04/14/2020   PNEUMOCOCCAL CONJUGATE-20 06/03/2023   Pfizer Covid-19 Vaccine Bivalent Booster 17yrs & up 05/08/2021   Pneumococcal Conjugate-13 07/06/2014   Pneumococcal Polysaccharide-23 12/15/2004, 05/28/2011, 05/27/2021   Td 08/27/2023   Tdap 02/23/2008, 06/15/2018   Zoster Recombinant(Shingrix) 12/02/2017, 02/06/2018   Zoster, Live 05/17/2010   Last Pap smear:  02/2017--normal, no high risk HPV detected Last mammogram: 11/2023 Last colonoscopy: 07/20/23 with Dr. Leigh, many adenomatous polyps.  Normally would do 3 yr f/u, poss not d/t age, will discuss at that time. Also noted to have diverticulosis and internal hemorrhoids. Last DEXA: 09/2021 T-2.0 at L forearm radius.  -1.8 at L fem neck Dentist: twice a year Ophtho: yearly  Exercise:   Limited for a few months related to foot fracture, knee pain.   She walks 10-15 minutes/day around her house.  Has dumbbells at home, not using them.   End of Life Discussion:  Patient has a living will and medical power of attorney, scanned in chart.   PMH, PSH, SH and FH were reviewed and updated.     ROS: The patient denies anorexia, fever, decreased hearing, ear pain, sore throat, breast concerns, chest pain, palpitations, dizziness, syncope, cough, swelling, nausea, vomiting, constipation, diarrhea, abdominal pain, melena, hematochezia, indigestion/heartburn,  hematuria, incontinence, dysuria, vaginal bleeding, discharge, odor or itch, genital lesions, numbness, tingling, weakness, tremor, suspicious skin lesions, abnormal bleeding, or enlarged lymph nodes.  Bilateral hip pain only when she lays on her side (bursitis). Sleeps comfortably in recliner. No hip pain with walking Allergies--not flaring currently. No nosebleeds since stopping flonase  *** Some hand pain from arthritis, mostly in the thumbs Some tingling in right 2nd and 3rd toes, chronic/stable. R shoulder with limited ROM, tolerable. Only hurts at end of ROM. Unchanged. Skin itching from eczema (back, arm, scalp). *** Vision unchanged from last year (some issues related to macular degeneration, worse at night). Breathing is stable.  Moods are good, some chronic anxiety, controlled R knee pain R foot pain?    PHYSICAL EXAM:  LMP  (LMP Unknown)   Wt Readings  from Last 3 Encounters:  04/04/24 205 lb 8 oz (93.2 kg)  03/16/24 209 lb 6.4 oz (95 kg)  03/02/24 212 lb (96.2 kg)  04/2022 Wt 226# 9.6 oz  General Appearance:   Alert, cooperative, no distress, appears stated age.   Head:   Normocephalic, without obvious abnormality, atraumatic    Eyes:   PERRL, conjunctiva/corneas clear, EOM's intact, fundi benign.   Ears:   Normal TM's and external ear canals  ***  Nose:   No drainage or sinus tenderness  Throat:   Normal mucosa  Neck:   Supple, no lymphadenopathy; thyroid : no enlargement/tenderness/ nodules; no carotid bruit or JVD.   Back:   Spine nontender, no curvature, ROM normal, no CVA tenderness    Lungs:   Clear to auscultation bilaterally without wheezes, rales or ronchi; respirations unlabored. Breath sounds are distant   Chest Wall:   No tenderness or mass. Absence of R breast, WHSS. Also WHSS L upper chest  Heart:   Regular rate and rhythm, S1 and S2 normal, no rub or gallop. Murmur persists, softer than prior to surgery; loudest at RUSB and radiates to carotids  Breast  Exam:   No tenderness, masses, or nipple discharge or inversion of L breast. WHSS along superior aspect of areola and left upper chest. No axillary lymphadenopathy. Right breast is absent.    Abdomen:   Soft, non-tender, nondistended, normoactive bowel sounds, no masses, no hepatosplenomegaly. + abdominal obesity. WHSS.   Genitalia:   Deferred  Rectal:   Deferred  Extremities:   Trace edema on right. Normal diabetic foot exam, normal sensation to monofilament.  ***  Pulses:   2+ and symmetric all extremities    Skin:   Skin turgor normal. Purpura at L forearm, hands. Venous stasis changes/hyperpigmentation bilateral lower legs, R>L, mild.  There is prominent superficial veins in feet giving bluish discoloration, L>R.  Lymph nodes:  Cervical, supraclavicular, inguinal and axillary nodes normal    Neurologic:   Normal strength, sensation and gait; reflexes 2+ and symmetric throughout                  Psych:  Normal mood, affect, hygiene and grooming   ***DIABETIC FOOT EXAM ***UPDATE_-tube in left ear? UPDATE if any edema ?pelvic ***UPDATE SKIN   ASSESSMENT/PLAN:  Update care teams Dentist--Dr. Ann Ophtho--[facility] specialist--Dr. Regenia GI: Dr. Leigh Derm: Ivin Domino  Previously declined pelvic.  If she isn't having problems, I'm okay with that.  Flu, COVID Did she have eye exam since 11/2022? If so, please get--looks like it was completed 11/2023, not a care gap.  Do we have record? Not abstracted. Has she seen urologist yet this year? (Yearly cystoscopy, last 05/2023). If so, please get records.  Is she taking any vitamin D , Ca, or any MVI other than the AREDs?  Needs DEXA ordered  GAD-7 and PHQ-9 (on treatment)  A1c INR TSH Lipids Urine microalb  She has future orders for labs for Dr. Linton idea when he wanted those done. See if patient knows. Also, looks like they did the iron studies as active rather than future orders.  If she was  supposed to get them done now, we can re-order all of them and I can cc the results to GI. If she wasn't supposed to get those done yet, then she probably needs to have them reorder the iron studies as future.  Microalbuminuria-- importance of BP control discussed.  Allergic to lisinopril.  ? Safety of ARB vs  using Farxiga if persists. DIABETIC FOOT EXAM***  Discussed monthly self breast exams and yearly mammograms; at least 30 minutes of aerobic activity at least 5 days/week and weight-bearing exercise 2x/week; proper sunscreen use reviewed; healthy diet, including goals of calcium  and vitamin D  intake and alcohol  recommendations (less than or equal to 1 drink/day) reviewed; regular seatbelt use; changing batteries in smoke detectors.  Immunization recommendations discussed--continue high dose flu shots yearly. *** COVID booster recommended, declined. *** Colonoscopy recommendations reviewed, UTD DEXA due   Full Code, Full Care MOST form reviewed and updated

## 2024-06-07 NOTE — Progress Notes (Deleted)
 No chief complaint on file.    Subjective:   Shannon Schultz is a 78 y.o. female who presents for a Medicare Annual Wellness Visit.  Visit info / Clinical Intake: Living arrangements:: (Patient-Rptd) lives with spouse/significant other Patient's Overall Health Status Rating: (!) (Patient-Rptd) fair Typical amount of pain: (Patient-Rptd) some Does pain affect daily life?: (Patient-Rptd) no  Dietary Habits and Nutritional Risks How many meals a day?: (Patient-Rptd) 3 Eats fruit and vegetables daily?: (!) (Patient-Rptd) no Most meals are obtained by: (Patient-Rptd) preparing own meals  Functional Status Activities of Daily Living (to include ambulation/medication): (Patient-Rptd) Independent Ambulation: (Patient-Rptd) Independent with device- listed below Medication Administration: (Patient-Rptd) Independent Home Management (perform basic housework or laundry): (Patient-Rptd) Needs assistance (comment) Manage your own finances?: (Patient-Rptd) yes Primary transportation is: (Patient-Rptd) driving  Fall Screening Falls in the past year?: (Patient-Rptd) 1 Number of falls in past year: (Patient-Rptd) 1 Was there an injury with Fall?: (Patient-Rptd) 1 Fall Risk Category Calculator: (Patient-Rptd) 3 Patient Fall Risk Level: (Patient-Rptd) High Fall Risk  Fall Risk Patient at Risk for Falls Due to: History of fall(s); No Fall Risks  Home and Transportation Safety: All rugs have non-skid backing?: (Patient-Rptd) yes All stairs or steps have railings?: (Patient-Rptd) N/A, no stairs Grab bars in the bathtub or shower?: (Patient-Rptd) yes Have non-skid surface in bathtub or shower?: (Patient-Rptd) yes Good home lighting?: (Patient-Rptd) yes Regular seat belt use?: (Patient-Rptd) yes Hospital stays in the last year:: (Patient-Rptd) no  Cognitive Assessment Difficulty concentrating, remembering, or making decisions? : (Patient-Rptd) no  Advance Directives (For Healthcare) Does  Patient Have a Medical Advance Directive?: Yes Type of Advance Directive: Healthcare Power of [place]; Living will Copy of Healthcare Power of Attorney in Chart?: Yes - validated most recent copy scanned in chart (See row information)    Allergies (verified) Contrast media [iodinated contrast media], Iohexol , Lisinopril, Sulfa antibiotics, Latex, Codeine, Levofloxacin, Lipitor [atorvastatin calcium ], Meloxicam, Nickel, and Vicodin [hydrocodone -acetaminophen ]   Current Medications (verified) Outpatient Encounter Medications as of 06/08/2024  Medication Sig   ACCU-CHEK AVIVA PLUS test strip TEST BLOOD SUGAR EVERY DAY   acetaminophen  (TYLENOL ) 500 MG tablet Take 500-1,000 mg by mouth every 6 (six) hours as needed for moderate pain (pain score 4-6) or headache.   albuterol  (PROVENTIL ) (2.5 MG/3ML) 0.083% nebulizer solution Take 3 mLs (2.5 mg total) by nebulization every 6 (six) hours as needed for wheezing or shortness of breath.   albuterol  (VENTOLIN  HFA) 108 (90 Base) MCG/ACT inhaler Inhale 2 puffs into the lungs every 6 (six) hours as needed for wheezing or shortness of breath.   Alcohol  Swabs  (B-D SINGLE USE SWABS  REGULAR) PADS Use twice a day when checking blood sugars   amoxicillin  (AMOXIL ) 500 MG capsule TAKE 4 CAPSULES BY MOUTH 1 HOUR BEFORE DENTAL PROCEDURE   apixaban  (ELIQUIS ) 2.5 MG TABS tablet Take 1 tablet (2.5 mg total) by mouth 2 (two) times daily.   carvedilol  (COREG ) 6.25 MG tablet TAKE 1 TABLET TWICE DAILY WITH MEALS   cetirizine (ZYRTEC) 10 MG tablet Take 10 mg by mouth daily.   clobetasol cream (TEMOVATE) 0.05 % Apply 1 Application topically 2 (two) times daily as needed (eczema).   escitalopram  (LEXAPRO ) 10 MG tablet TAKE 1 TABLET EVERY DAY   guaiFENesin  (MUCINEX ) 600 MG 12 hr tablet Take 600 mg by mouth at bedtime.   hydrochlorothiazide  (HYDRODIURIL ) 12.5 MG tablet Take 1 tablet (12.5 mg total) by mouth daily.   hydrocortisone cream 1 % Apply 1 Application topically 2  (two) times  daily as needed for itching.   Lancets Ultra Thin 30G MISC TEST BLOOD SUGAR EVERY DAY   montelukast  (SINGULAIR ) 10 MG tablet TAKE 1 TABLET AT BEDTIME   Multiple Vitamins-Minerals (PRESERVISION AREDS 2 PO) Take 1 each by mouth 2 (two) times daily.   neomycin -polymyxin-hydrocortisone (CORTISPORIN) 3.5-10000-1 OTIC suspension Place 4 drops into the left ear 3 (three) times daily.   omeprazole  (PRILOSEC) 40 MG capsule TAKE 1 CAPSULE TWICE DAILY   Semaglutide , 1 MG/DOSE, (OZEMPIC , 1 MG/DOSE,) 4 MG/3ML SOPN Inject 1 mg into the skin every Friday.   simvastatin  (ZOCOR ) 20 MG tablet TAKE 1 TABLET AT BEDTIME   TRELEGY ELLIPTA  100-62.5-25 MCG/ACT AEPB INHALE 1 PUFF INTO THE LUNGS DAILY.   triamcinolone  cream (KENALOG ) 0.1 % Apply 1 Application topically 2 (two) times daily as needed (eczema/dry skin).   valACYclovir  (VALTREX ) 500 MG tablet TAKE 1 TABLET EVERY DAY   No facility-administered encounter medications on file as of 06/08/2024.    History: Past Medical History:  Diagnosis Date   Acute medial meniscus tear    Anxiety    Arthritis    hands (05/15/2017)   Baker's cyst    Bladder cancer (HCC) 2018   Breast cancer, right (HCC) 1992   DCIS,bladder ca (just dx)   Cirrhosis (HCC)    Colon polyp    Complication of anesthesia 1992   local anesthesia used was hard to awaken from-no problems since (05/15/2017)   COPD (chronic obstructive pulmonary disease) (HCC)    Coronary artery disease    COVID-19 virus infection 05/05/2019   Diverticulosis    Elevated liver enzymes    fatty liver per ultrasound per pt   FHx: BRCA2 gene positive    sister with BRCA2 mutation (pt tested NEGATIVE)   Fibromyalgia    Gastric ulcer    Heart murmur    faintly heard since TAVR 08-14-2020   HLD (hyperlipidemia)    HSV (herpes simplex virus) infection    on hip--on daily suppression   Hypertension    Hypothyroidism    took med 7 yrs after birth of 1st child   Impaired glucose tolerance     Migraine    Nonalcoholic steatohepatitis (NASH) 03/23/2022   On home oxygen  therapy    have it available but I'm not using it (05/15/2017)   Osteopenia    Personal history of breast cancer 11/15/2021   S/P TAVR (transcatheter aortic valve replacement) 08/14/2020   s/p TAVR with a 26 mm Edwards S3U via the left subclavian approach by Dr. Dusty and Dr. Verlin   Severe aortic stenosis    Severe obesity (BMI >= 40) (HCC) 10/06/2014   Sleep apnea    Sleeps with 2 L O2 @ night   Type 2 diabetes mellitus (HCC)    Urothelial cancer (HCC) 03/10/2017   Incidental bladder mass noted on CT, papillary urothelial CA on pathology from excision 12/2016; post-op instillation of chemo. Due for f/u cystoscopy 03/2017   Venous insufficiency 09/10/2018   Ventral hernia    Ventral hernia without obstruction or gangrene 05/15/2017   Vitamin D  deficiency disease    Past Surgical History:  Procedure Laterality Date   ABDOMINAL HERNIA REPAIR  05/15/2017   BIOPSY  07/20/2023   Procedure: BIOPSY;  Surgeon: Leigh Elspeth SQUIBB, MD;  Location: [facility];  Service: Gastroenterology;;   BIOPSY OF SKIN SUBCUTANEOUS TISSUE AND/OR MUCOUS MEMBRANE  12/03/2023   Procedure: BIOPSY, SKIN, SUBCUTANEOUS TISSUE, OR MUCOUS MEMBRANE;  Surgeon: Leigh Elspeth SQUIBB, MD;  Location: [facility];  Service: Gastroenterology;;   BREAST BIOPSY Right 1992   BREAST BIOPSY Left 2018   BREAST EXCISIONAL BIOPSY Left 2018   ATYPICAL DUCTAL HYPERPLASIA INVOLVING A COMPLEX   BREAST LUMPECTOMY WITH RADIOACTIVE SEED LOCALIZATION Left 12/22/2016   Procedure: LEFT BREAST LUMPECTOMY WITH RADIOACTIVE SEED LOCALIZATION;  Surgeon: Curvin Deward MOULD, MD;  Location: [facility] OR;  Service: General;  Laterality: Left;   CARDIAC CATHETERIZATION     CATARACT EXTRACTION, BILATERAL Bilateral R 03/16/2019 L 03/30/2019   Dr. Lelon   COLONOSCOPY  2009, 08/2010, 12/2015   Dr. Kristie; only 1 had any polyps (05/15/2017)   COLONOSCOPY WITH PROPOFOL  N/A 07/20/2023    Procedure: COLONOSCOPY WITH PROPOFOL ;  Surgeon: Leigh Elspeth SQUIBB, MD;  Location: [facility];  Service: Gastroenterology;  Laterality: N/A;   CYSTOSCOPY  04/23/2017   CYSTOSCOPY W/ RETROGRADES Bilateral 01/12/2017   Procedure: CYSTOSCOPY WITH RETROGRADE PYELOGRAM/ EXAM UNDER ANESTHESIA;  Surgeon: Renda Glance, MD;  Location: [facility];  Service: Urology;  Laterality: Bilateral;   ENDOVENOUS ABLATION SAPHENOUS VEIN W/ LASER Right 01/09/2022   endovenous laser ablation right greater saphenous vein by Penne Colorado MD   ESOPHAGOGASTRODUODENOSCOPY N/A 12/03/2023   Procedure: EGD (ESOPHAGOGASTRODUODENOSCOPY);  Surgeon: Leigh Elspeth SQUIBB, MD;  Location: [facility];  Service: Gastroenterology;  Laterality: N/A;   ESOPHAGOGASTRODUODENOSCOPY (EGD) WITH PROPOFOL  N/A 07/20/2023   Procedure: ESOPHAGOGASTRODUODENOSCOPY (EGD) WITH PROPOFOL ;  Surgeon: Leigh Elspeth SQUIBB, MD;  Location: [facility];  Service: Gastroenterology;  Laterality: N/A;   EYE SURGERY Bilateral    Cataracts removed   FEMUR IM NAIL Right 08/24/2019   Procedure: INTRAMEDULLARY (IM) NAIL FEMORAL;  Surgeon: Fidel Rogue, MD;  Location: [facility];  Service: Orthopedics;  Laterality: Right;   HERNIA REPAIR     INSERTION OF MESH N/A 05/15/2017   Procedure: INSERTION OF MESH;  Surgeon: Curvin Deward MOULD, MD;  Location: [facility] OR;  Service: General;  Laterality: N/A;   LAPAROSCOPIC CHOLECYSTECTOMY  2003   MASTECTOMY Right 1992   MYRINGOTOMY WITH TUBE PLACEMENT Left 05/11/2024   Dr. Llewellyn   POLYPECTOMY  07/20/2023   Procedure: POLYPECTOMY;  Surgeon: Leigh Elspeth SQUIBB, MD;  Location: [facility];  Service: Gastroenterology;;   RIGHT/LEFT HEART CATH AND CORONARY ANGIOGRAPHY N/A 07/05/2020   Procedure: RIGHT/LEFT HEART CATH AND CORONARY ANGIOGRAPHY;  Surgeon: Verlin Lonni BIRCH, MD;  Location: [facility];  Service: Cardiovascular;  Laterality: N/A;   TEE WITHOUT CARDIOVERSION N/A 08/14/2020   Procedure:  TRANSESOPHAGEAL ECHOCARDIOGRAM (TEE);  Surgeon: Verlin Lonni BIRCH, MD;  Location: [facility] OR;  Service: Open Heart Surgery;  Laterality: N/A;   TRANSURETHRAL RESECTION OF BLADDER TUMOR WITH MITOMYCIN -C N/A 01/12/2017   Procedure: TRANSURETHRAL RESECTION OF BLADDER TUMOR WITH POSSIBLE POST OPERATIVE INSTILLATION OF MITOMYCIN -C;  Surgeon: Renda Glance, MD;  Location: [facility];  Service: Urology;  Laterality: N/A;   TYMPANOSTOMY TUBE PLACEMENT Bilateral 1980s   UMBILICAL HERNIA REPAIR  2003   VENTRAL HERNIA REPAIR N/A 05/15/2017   Procedure: VENTRAL HERNIA REPAIR WITH MESH;  Surgeon: Curvin Deward MOULD, MD;  Location[facility] OR;  Service: General;  Laterality: N/A;   Family History  Problem Relation Age of Onset   Heart disease Mother        tachycardia   Heart disease Father    Diabetes Father    Hypertension Father    Asthma Sister    Allergies Sister    Hyperlipidemia Sister    Hashimoto's thyroiditis Sister    Fibromyalgia Sister    Skin cancer Sister  leg; dx after 50; surgery only   Arthritis Sister    Hypertension Sister    Kidney disease Sister    Varicose Veins Sister    Asthma Sister    Allergies Sister    Hyperlipidemia Sister    Hashimoto's thyroiditis Sister    Fibromyalgia Sister    Arthritis Sister    Breast cancer Sister 42       BRCA2 positive; metastatic in late 56s   Arthritis Brother    Stomach cancer Maternal Grandmother    Cancer Maternal Grandmother        deceased 65; unk. primary; possibly stomach   Tuberculosis Maternal Grandfather    Stroke Paternal Grandmother 34       died of cerebral hemorrhage   ADD / ADHD Son    Depression Son    Other Niece        BRCA2 positive; prophylatic mastectomy   Cancer Other        unknown GYN cancers; MGM's sisters   Colon cancer Neg Hx    Esophageal cancer Neg Hx    Pancreatic cancer Neg Hx    Social History   Occupational History   Occupation: Retired  Tobacco Use   Smoking status: Former    Current  packs/day: 0.00    Average packs/day: 1 pack/day for 46.0 years (46.0 ttl pk-yrs)    Types: Cigarettes    Start date: 05/30/1965    Quit date: 05/31/2011    Years since quitting: 13.0   Smokeless tobacco: Never  Vaping Use   Vaping status: Never Used  Substance and Sexual Activity   Alcohol  use: No   Drug use: No   Sexual activity: Yes    Partners: Male    Birth control/protection: Post-menopausal   Tobacco Counseling Counseling given: Not Answered  SDOH Screenings   Food Insecurity: No Food Insecurity (06/04/2024)  Housing: Low Risk  (06/04/2024)  Transportation Needs: No Transportation Needs (06/04/2024)  Utilities: Not At Risk (11/20/2022)  Depression (PHQ2-9): Low Risk  (06/03/2023)  Financial Resource Strain: Low Risk  (06/04/2024)  Physical Activity: Inactive (06/04/2024)  Social Connections: Socially Integrated (06/04/2024)  Stress: No Stress Concern Present (06/04/2024)  Tobacco Use: Medium Risk (05/11/2024)   Received from [facility]   See flowsheets for full screening details  Depression Screen     Goals Addressed   None          Objective:    There were no vitals filed for this visit. There is no height or weight on file to calculate BMI.  Hearing/Vision screen No results found. Immunizations and Health Maintenance Health Maintenance  Topic Date Due   Influenza Vaccine  01/29/2024   COVID-19 Vaccine (6 - 2025-26 season) 02/29/2024   Medicare Annual Wellness (AWV)  06/02/2024   FOOT EXAM  06/02/2024   HEMOGLOBIN A1C  06/16/2024   OPHTHALMOLOGY EXAM  12/07/2024   Diabetic kidney evaluation - Urine ACR  12/15/2024   Mammogram  12/16/2024   Lung Cancer Screening  03/18/2025   Diabetic kidney evaluation - eGFR measurement  04/04/2025   Colonoscopy  07/19/2030   DTaP/Tdap/Td (4 - Td or Tdap) 08/26/2033   Pneumococcal Vaccine: 50+ Years  Completed   Hepatitis C Screening  Completed   Zoster Vaccines- Shingrix  Completed   Meningococcal B Vaccine  Aged  Out   Hepatitis B Vaccines 19-59 Average Risk  Discontinued        Assessment/Plan:  This is a routine wellness examination for [place].  Patient  Care Team: Randol Dawes, MD as PCP - General (Family Medicine) Croitoru, Jerel, MD as PCP - Cardiology (Cardiology) Randol Dawes, MD as Referring Physician (Family Medicine) Liane Sharyne MATSU, [facility] (Inactive) as Pharmacist (Pharmacist) Neomi Johnston ONEIDA DEVONNA as Physician Assistant (Hematology and Oncology) Kristie Lamprey, MD as Consulting Physician (Gastroenterology) Curvin Deward MOULD, MD as Consulting Physician (General Surgery) Renda Glance, MD as Consulting Physician (Urology) Sheril Coy, MD as Consulting Physician (Orthopedic Surgery) Kassie Acquanetta Bradley, MD as Consulting Physician (Pulmonary Disease) Fidel Rogue, MD as Consulting Physician (Orthopedic Surgery) Sharl Selinda Dover, MD as Consulting Physician (Orthopedic Surgery)  I have personally reviewed and noted the following in the patient's chart:   Medical and social history Use of alcohol , tobacco or illicit drugs  Current medications and supplements including opioid prescriptions. Functional ability and status Nutritional status Physical activity Advanced directives List of other physicians Hospitalizations, surgeries, and ER visits in previous 12 months Vitals Screenings to include cognitive, depression, and falls Referrals and appointments  No orders of the defined types were placed in this encounter.  In addition, I have reviewed and discussed with patient certain preventive protocols, quality metrics, and best practice recommendations. A written personalized care plan for preventive services as well as general preventive health recommendations were provided to patient.   Lucienne JULIANNA Fuse, RMA   06/07/2024   No follow-ups on file.  After Visit Summary: (In Person-Printed) AVS printed and given to the patient  Nurse Notes: none

## 2024-06-07 NOTE — Progress Notes (Unsigned)
 No chief complaint on file.    Subjective:   Shannon Schultz is a 78 y.o. female who presents for a Medicare Annual Wellness Visit.  Visit info / Clinical Intake: Living arrangements:: (Patient-Rptd) lives with spouse/significant other Patient's Overall Health Status Rating: (!) (Patient-Rptd) fair Typical amount of pain: (Patient-Rptd) some Does pain affect daily life?: (Patient-Rptd) no  Dietary Habits and Nutritional Risks How many meals a day?: (Patient-Rptd) 3 Eats fruit and vegetables daily?: (!) (Patient-Rptd) no Most meals are obtained by: (Patient-Rptd) preparing own meals  Functional Status Activities of Daily Living (to include ambulation/medication): (Patient-Rptd) Independent Ambulation: (Patient-Rptd) Independent with device- listed below Medication Administration: (Patient-Rptd) Independent Home Management (perform basic housework or laundry): (Patient-Rptd) Needs assistance (comment) Manage your own finances?: (Patient-Rptd) yes Primary transportation is: (Patient-Rptd) driving  Fall Screening Falls in the past year?: (Patient-Rptd) 1 Number of falls in past year: (Patient-Rptd) 1 Was there an injury with Fall?: (Patient-Rptd) 1 Fall Risk Category Calculator: (Patient-Rptd) 3 Patient Fall Risk Level: (Patient-Rptd) High Fall Risk  Fall Risk Patient at Risk for Falls Due to: History of fall(s); No Fall Risks  Home and Transportation Safety: All rugs have non-skid backing?: (Patient-Rptd) yes All stairs or steps have railings?: (Patient-Rptd) N/A, no stairs Grab bars in the bathtub or shower?: (Patient-Rptd) yes Have non-skid surface in bathtub or shower?: (Patient-Rptd) yes Good home lighting?: (Patient-Rptd) yes Regular seat belt use?: (Patient-Rptd) yes Hospital stays in the last year:: (Patient-Rptd) no  Cognitive Assessment Difficulty concentrating, remembering, or making decisions? : (Patient-Rptd) no  Advance Directives (For Healthcare) Does  Patient Have a Medical Advance Directive?: Yes Type of Advance Directive: Healthcare Power of [place]; Living will Copy of Healthcare Power of Attorney in Chart?: Yes - validated most recent copy scanned in chart (See row information)    Allergies (verified) Contrast media [iodinated contrast media], Iohexol , Lisinopril, Sulfa antibiotics, Latex, Codeine, Levofloxacin, Lipitor [atorvastatin calcium ], Meloxicam, Nickel, and Vicodin [hydrocodone -acetaminophen ]   Current Medications (verified) Outpatient Encounter Medications as of 06/08/2024  Medication Sig   ACCU-CHEK AVIVA PLUS test strip TEST BLOOD SUGAR EVERY DAY   acetaminophen  (TYLENOL ) 500 MG tablet Take 500-1,000 mg by mouth every 6 (six) hours as needed for moderate pain (pain score 4-6) or headache.   albuterol  (PROVENTIL ) (2.5 MG/3ML) 0.083% nebulizer solution Take 3 mLs (2.5 mg total) by nebulization every 6 (six) hours as needed for wheezing or shortness of breath.   albuterol  (VENTOLIN  HFA) 108 (90 Base) MCG/ACT inhaler Inhale 2 puffs into the lungs every 6 (six) hours as needed for wheezing or shortness of breath.   Alcohol  Swabs  (B-D SINGLE USE SWABS  REGULAR) PADS Use twice a day when checking blood sugars   amoxicillin  (AMOXIL ) 500 MG capsule TAKE 4 CAPSULES BY MOUTH 1 HOUR BEFORE DENTAL PROCEDURE   apixaban  (ELIQUIS ) 2.5 MG TABS tablet Take 1 tablet (2.5 mg total) by mouth 2 (two) times daily.   carvedilol  (COREG ) 6.25 MG tablet TAKE 1 TABLET TWICE DAILY WITH MEALS   cetirizine (ZYRTEC) 10 MG tablet Take 10 mg by mouth daily.   clobetasol cream (TEMOVATE) 0.05 % Apply 1 Application topically 2 (two) times daily as needed (eczema).   escitalopram  (LEXAPRO ) 10 MG tablet TAKE 1 TABLET EVERY DAY   guaiFENesin  (MUCINEX ) 600 MG 12 hr tablet Take 600 mg by mouth at bedtime.   hydrochlorothiazide  (HYDRODIURIL ) 12.5 MG tablet Take 1 tablet (12.5 mg total) by mouth daily.   hydrocortisone cream 1 % Apply 1 Application topically 2  (two) times  daily as needed for itching.   Lancets Ultra Thin 30G MISC TEST BLOOD SUGAR EVERY DAY   montelukast  (SINGULAIR ) 10 MG tablet TAKE 1 TABLET AT BEDTIME   Multiple Vitamins-Minerals (PRESERVISION AREDS 2 PO) Take 1 each by mouth 2 (two) times daily.   neomycin -polymyxin-hydrocortisone (CORTISPORIN) 3.5-10000-1 OTIC suspension Place 4 drops into the left ear 3 (three) times daily.   omeprazole  (PRILOSEC) 40 MG capsule TAKE 1 CAPSULE TWICE DAILY   Semaglutide , 1 MG/DOSE, (OZEMPIC , 1 MG/DOSE,) 4 MG/3ML SOPN Inject 1 mg into the skin every Friday.   simvastatin  (ZOCOR ) 20 MG tablet TAKE 1 TABLET AT BEDTIME   TRELEGY ELLIPTA  100-62.5-25 MCG/ACT AEPB INHALE 1 PUFF INTO THE LUNGS DAILY.   triamcinolone  cream (KENALOG ) 0.1 % Apply 1 Application topically 2 (two) times daily as needed (eczema/dry skin).   valACYclovir  (VALTREX ) 500 MG tablet TAKE 1 TABLET EVERY DAY   No facility-administered encounter medications on file as of 06/08/2024.    History: Past Medical History:  Diagnosis Date   Acute medial meniscus tear    Anxiety    Arthritis    hands (05/15/2017)   Baker's cyst    Bladder cancer (HCC) 2018   Breast cancer, right (HCC) 1992   DCIS,bladder ca (just dx)   Cirrhosis (HCC)    Colon polyp    Complication of anesthesia 1992   local anesthesia used was hard to awaken from-no problems since (05/15/2017)   COPD (chronic obstructive pulmonary disease) (HCC)    Coronary artery disease    COVID-19 virus infection 05/05/2019   Diverticulosis    Elevated liver enzymes    fatty liver per ultrasound per pt   FHx: BRCA2 gene positive    sister with BRCA2 mutation (pt tested NEGATIVE)   Fibromyalgia    Gastric ulcer    Heart murmur    faintly heard since TAVR 08-14-2020   HLD (hyperlipidemia)    HSV (herpes simplex virus) infection    on hip--on daily suppression   Hypertension    Hypothyroidism    took med 7 yrs after birth of 1st child   Impaired glucose tolerance     Migraine    Nonalcoholic steatohepatitis (NASH) 03/23/2022   On home oxygen  therapy    have it available but I'm not using it (05/15/2017)   Osteopenia    Personal history of breast cancer 11/15/2021   S/P TAVR (transcatheter aortic valve replacement) 08/14/2020   s/p TAVR with a 26 mm Edwards S3U via the left subclavian approach by Dr. Dusty and Dr. Verlin   Severe aortic stenosis    Severe obesity (BMI >= 40) (HCC) 10/06/2014   Sleep apnea    Sleeps with 2 L O2 @ night   Type 2 diabetes mellitus (HCC)    Urothelial cancer (HCC) 03/10/2017   Incidental bladder mass noted on CT, papillary urothelial CA on pathology from excision 12/2016; post-op instillation of chemo. Due for f/u cystoscopy 03/2017   Venous insufficiency 09/10/2018   Ventral hernia    Ventral hernia without obstruction or gangrene 05/15/2017   Vitamin D  deficiency disease    Past Surgical History:  Procedure Laterality Date   ABDOMINAL HERNIA REPAIR  05/15/2017   BIOPSY  07/20/2023   Procedure: BIOPSY;  Surgeon: Leigh Elspeth SQUIBB, MD;  Location: [facility];  Service: Gastroenterology;;   BIOPSY OF SKIN SUBCUTANEOUS TISSUE AND/OR MUCOUS MEMBRANE  12/03/2023   Procedure: BIOPSY, SKIN, SUBCUTANEOUS TISSUE, OR MUCOUS MEMBRANE;  Surgeon: Leigh Elspeth SQUIBB, MD;  Location: [facility];  Service: Gastroenterology;;   BREAST BIOPSY Right 1992   BREAST BIOPSY Left 2018   BREAST EXCISIONAL BIOPSY Left 2018   ATYPICAL DUCTAL HYPERPLASIA INVOLVING A COMPLEX   BREAST LUMPECTOMY WITH RADIOACTIVE SEED LOCALIZATION Left 12/22/2016   Procedure: LEFT BREAST LUMPECTOMY WITH RADIOACTIVE SEED LOCALIZATION;  Surgeon: Curvin Deward MOULD, MD;  Location: [facility] OR;  Service: General;  Laterality: Left;   CARDIAC CATHETERIZATION     CATARACT EXTRACTION, BILATERAL Bilateral R 03/16/2019 L 03/30/2019   Dr. Lelon   COLONOSCOPY  2009, 08/2010, 12/2015   Dr. Kristie; only 1 had any polyps (05/15/2017)   COLONOSCOPY WITH PROPOFOL  N/A 07/20/2023    Procedure: COLONOSCOPY WITH PROPOFOL ;  Surgeon: Leigh Elspeth SQUIBB, MD;  Location: [facility];  Service: Gastroenterology;  Laterality: N/A;   CYSTOSCOPY  04/23/2017   CYSTOSCOPY W/ RETROGRADES Bilateral 01/12/2017   Procedure: CYSTOSCOPY WITH RETROGRADE PYELOGRAM/ EXAM UNDER ANESTHESIA;  Surgeon: Renda Glance, MD;  Location: [facility];  Service: Urology;  Laterality: Bilateral;   ENDOVENOUS ABLATION SAPHENOUS VEIN W/ LASER Right 01/09/2022   endovenous laser ablation right greater saphenous vein by Penne Colorado MD   ESOPHAGOGASTRODUODENOSCOPY N/A 12/03/2023   Procedure: EGD (ESOPHAGOGASTRODUODENOSCOPY);  Surgeon: Leigh Elspeth SQUIBB, MD;  Location: [facility];  Service: Gastroenterology;  Laterality: N/A;   ESOPHAGOGASTRODUODENOSCOPY (EGD) WITH PROPOFOL  N/A 07/20/2023   Procedure: ESOPHAGOGASTRODUODENOSCOPY (EGD) WITH PROPOFOL ;  Surgeon: Leigh Elspeth SQUIBB, MD;  Location: [facility];  Service: Gastroenterology;  Laterality: N/A;   EYE SURGERY Bilateral    Cataracts removed   FEMUR IM NAIL Right 08/24/2019   Procedure: INTRAMEDULLARY (IM) NAIL FEMORAL;  Surgeon: Fidel Rogue, MD;  Location: [facility];  Service: Orthopedics;  Laterality: Right;   HERNIA REPAIR     INSERTION OF MESH N/A 05/15/2017   Procedure: INSERTION OF MESH;  Surgeon: Curvin Deward MOULD, MD;  Location: [facility] OR;  Service: General;  Laterality: N/A;   LAPAROSCOPIC CHOLECYSTECTOMY  2003   MASTECTOMY Right 1992   MYRINGOTOMY WITH TUBE PLACEMENT Left 05/11/2024   Dr. Llewellyn   POLYPECTOMY  07/20/2023   Procedure: POLYPECTOMY;  Surgeon: Leigh Elspeth SQUIBB, MD;  Location: [facility];  Service: Gastroenterology;;   RIGHT/LEFT HEART CATH AND CORONARY ANGIOGRAPHY N/A 07/05/2020   Procedure: RIGHT/LEFT HEART CATH AND CORONARY ANGIOGRAPHY;  Surgeon: Verlin Lonni BIRCH, MD;  Location: [facility];  Service: Cardiovascular;  Laterality: N/A;   TEE WITHOUT CARDIOVERSION N/A 08/14/2020   Procedure:  TRANSESOPHAGEAL ECHOCARDIOGRAM (TEE);  Surgeon: Verlin Lonni BIRCH, MD;  [facility] OR;  Service: Open Heart Surgery;  Laterality: N/A;   TRANSURETHRAL RESECTION OF BLADDER TUMOR WITH MITOMYCIN -C N/A 01/12/2017   Procedure: TRANSURETHRAL RESECTION OF BLADDER TUMOR WITH POSSIBLE POST OPERATIVE INSTILLATION OF MITOMYCIN -C;  Surgeon: Renda Glance, MD;  Location: [facility];  Service: Urology;  Laterality: N/A;   TYMPANOSTOMY TUBE PLACEMENT Bilateral 1980s   UMBILICAL HERNIA REPAIR  2003   VENTRAL HERNIA REPAIR N/A 05/15/2017   Procedure: VENTRAL HERNIA REPAIR WITH MESH;  Surgeon: Curvin Deward MOULD, MD;  Location: [facility] OR;  Service: General;  Laterality: N/A;   Family History  Problem Relation Age of Onset   Heart disease Mother        tachycardia   Heart disease Father    Diabetes Father    Hypertension Father    Asthma Sister    Allergies Sister    Hyperlipidemia Sister    Hashimoto's thyroiditis Sister    Fibromyalgia Sister    Skin cancer Sister  leg; dx after 50; surgery only   Arthritis Sister    Hypertension Sister    Kidney disease Sister    Varicose Veins Sister    Asthma Sister    Allergies Sister    Hyperlipidemia Sister    Hashimoto's thyroiditis Sister    Fibromyalgia Sister    Arthritis Sister    Breast cancer Sister 30       BRCA2 positive; metastatic in late 41s   Arthritis Brother    Stomach cancer Maternal Grandmother    Cancer Maternal Grandmother        deceased 54; unk. primary; possibly stomach   Tuberculosis Maternal Grandfather    Stroke Paternal Grandmother 79       died of cerebral hemorrhage   ADD / ADHD Son    Depression Son    Other Niece        BRCA2 positive; prophylatic mastectomy   Cancer Other        unknown GYN cancers; MGM's sisters   Colon cancer Neg Hx    Esophageal cancer Neg Hx    Pancreatic cancer Neg Hx    Social History   Occupational History   Occupation: Retired  Tobacco Use   Smoking status: Former    Current  packs/day: 0.00    Average packs/day: 1 pack/day for 46.0 years (46.0 ttl pk-yrs)    Types: Cigarettes    Start date: 05/30/1965    Quit date: 05/31/2011    Years since quitting: 13.0   Smokeless tobacco: Never  Vaping Use   Vaping status: Never Used  Substance and Sexual Activity   Alcohol  use: No   Drug use: No   Sexual activity: Yes    Partners: Male    Birth control/protection: Post-menopausal   Tobacco Counseling Counseling given: Not Answered  SDOH Screenings   Food Insecurity: No Food Insecurity (06/04/2024)  Housing: Low Risk  (06/04/2024)  Transportation Needs: No Transportation Needs (06/04/2024)  Utilities: Not At Risk (11/20/2022)  Depression (PHQ2-9): Low Risk  (06/03/2023)  Financial Resource Strain: Low Risk  (06/04/2024)  Physical Activity: Inactive (06/04/2024)  Social Connections: Socially Integrated (06/04/2024)  Stress: No Stress Concern Present (06/04/2024)  Tobacco Use: Medium Risk (05/11/2024)   Received from [facility]   See flowsheets for full screening details  Depression Screen     Goals Addressed   None          Objective:    There were no vitals filed for this visit. There is no height or weight on file to calculate BMI.  Hearing/Vision screen No results found. Immunizations and Health Maintenance Health Maintenance  Topic Date Due   Influenza Vaccine  01/29/2024   COVID-19 Vaccine (6 - 2025-26 season) 02/29/2024   Medicare Annual Wellness (AWV)  06/02/2024   FOOT EXAM  06/02/2024   HEMOGLOBIN A1C  06/16/2024   OPHTHALMOLOGY EXAM  12/07/2024   Diabetic kidney evaluation - Urine ACR  12/15/2024   Mammogram  12/16/2024   Lung Cancer Screening  03/18/2025   Diabetic kidney evaluation - eGFR measurement  04/04/2025   Colonoscopy  07/19/2030   DTaP/Tdap/Td (4 - Td or Tdap) 08/26/2033   Pneumococcal Vaccine: 50+ Years  Completed   Hepatitis C Screening  Completed   Zoster Vaccines- Shingrix  Completed   Meningococcal B Vaccine  Aged  Out   Hepatitis B Vaccines 19-59 Average Risk  Discontinued        Assessment/Plan:  This is a routine wellness examination for [place].  Patient  Care Team: Randol Dawes, MD as PCP - General (Family Medicine) Croitoru, Jerel, MD as PCP - Cardiology (Cardiology) Randol Dawes, MD as Referring Physician (Family Medicine) Liane Sharyne MATSU, [facility] (Inactive) as Pharmacist (Pharmacist) Neomi Johnston ONEIDA DEVONNA as Physician Assistant (Hematology and Oncology) Kristie Lamprey, MD as Consulting Physician (Gastroenterology) Curvin Deward MOULD, MD as Consulting Physician (General Surgery) Renda Glance, MD as Consulting Physician (Urology) Sheril Coy, MD as Consulting Physician (Orthopedic Surgery) Kassie Acquanetta Bradley, MD as Consulting Physician (Pulmonary Disease) Fidel Rogue, MD as Consulting Physician (Orthopedic Surgery) Sharl Selinda Dover, MD as Consulting Physician (Orthopedic Surgery) Federico Norleen ONEIDA MADISON, MD as Consulting Physician (Hematology and Oncology) Latanya, Corean MATSU, MD as Referring Physician Arnaldo Juliene RAMAN, MD as Attending Physician (Family Medicine) Armbruster, Elspeth SQUIBB, MD as Consulting Physician (Gastroenterology) Llewellyn Gerard LABOR, DO as Consulting Physician (Otolaryngology)  I have personally reviewed and noted the following in the patient's chart:   Medical and social history Use of alcohol , tobacco or illicit drugs  Current medications and supplements including opioid prescriptions. Functional ability and status Nutritional status Physical activity Advanced directives List of other physicians Hospitalizations, surgeries, and ER visits in previous 12 months Vitals Screenings to include cognitive, depression, and falls Referrals and appointments  No orders of the defined types were placed in this encounter.  In addition, I have reviewed and discussed with patient certain preventive protocols, quality metrics, and best practice recommendations. A written personalized  care plan for preventive services as well as general preventive health recommendations were provided to patient.   Lucienne JULIANNA Fuse, RMA   06/07/2024   No follow-ups on file.  After Visit Summary: (In Person-Printed) AVS printed and given to the patient  Nurse Notes: none

## 2024-06-08 ENCOUNTER — Encounter: Payer: Self-pay | Admitting: Family Medicine

## 2024-06-08 ENCOUNTER — Ambulatory Visit (INDEPENDENT_AMBULATORY_CARE_PROVIDER_SITE_OTHER): Payer: Self-pay | Admitting: Family Medicine

## 2024-06-08 VITALS — BP 122/60 | HR 68 | Ht 62.0 in | Wt 214.0 lb

## 2024-06-08 DIAGNOSIS — E66813 Obesity, class 3: Secondary | ICD-10-CM

## 2024-06-08 DIAGNOSIS — E1159 Type 2 diabetes mellitus with other circulatory complications: Secondary | ICD-10-CM | POA: Diagnosis not present

## 2024-06-08 DIAGNOSIS — M810 Age-related osteoporosis without current pathological fracture: Secondary | ICD-10-CM

## 2024-06-08 DIAGNOSIS — J9611 Chronic respiratory failure with hypoxia: Secondary | ICD-10-CM

## 2024-06-08 DIAGNOSIS — E785 Hyperlipidemia, unspecified: Secondary | ICD-10-CM

## 2024-06-08 DIAGNOSIS — K7460 Unspecified cirrhosis of liver: Secondary | ICD-10-CM

## 2024-06-08 DIAGNOSIS — E1169 Type 2 diabetes mellitus with other specified complication: Secondary | ICD-10-CM | POA: Diagnosis not present

## 2024-06-08 DIAGNOSIS — J439 Emphysema, unspecified: Secondary | ICD-10-CM

## 2024-06-08 DIAGNOSIS — Z0000 Encounter for general adult medical examination without abnormal findings: Secondary | ICD-10-CM

## 2024-06-08 DIAGNOSIS — I152 Hypertension secondary to endocrine disorders: Secondary | ICD-10-CM

## 2024-06-08 DIAGNOSIS — Z23 Encounter for immunization: Secondary | ICD-10-CM

## 2024-06-08 DIAGNOSIS — Z6841 Body Mass Index (BMI) 40.0 and over, adult: Secondary | ICD-10-CM | POA: Diagnosis not present

## 2024-06-08 DIAGNOSIS — Z86711 Personal history of pulmonary embolism: Secondary | ICD-10-CM

## 2024-06-08 DIAGNOSIS — I8500 Esophageal varices without bleeding: Secondary | ICD-10-CM | POA: Diagnosis not present

## 2024-06-08 DIAGNOSIS — F411 Generalized anxiety disorder: Secondary | ICD-10-CM

## 2024-06-08 DIAGNOSIS — B009 Herpesviral infection, unspecified: Secondary | ICD-10-CM

## 2024-06-08 DIAGNOSIS — R809 Proteinuria, unspecified: Secondary | ICD-10-CM

## 2024-06-08 DIAGNOSIS — Z7985 Long-term (current) use of injectable non-insulin antidiabetic drugs: Secondary | ICD-10-CM

## 2024-06-08 LAB — POCT GLYCOSYLATED HEMOGLOBIN (HGB A1C): Hemoglobin A1C: 5.4 % (ref 4.0–5.6)

## 2024-06-08 MED ORDER — ESCITALOPRAM OXALATE 5 MG PO TABS: 5.0000 mg | ORAL_TABLET | Freq: Every day | ORAL | 0 refills | Status: AC

## 2024-06-08 MED ORDER — ALENDRONATE SODIUM 70 MG PO TABS: 70.0000 mg | ORAL_TABLET | ORAL | 0 refills | Status: DC

## 2024-06-09 LAB — LIPID PANEL
Chol/HDL Ratio: 2.3 ratio (ref 0.0–4.4)
Cholesterol, Total: 125 mg/dL (ref 100–199)
HDL: 54 mg/dL (ref >39)
LDL Chol Calc (NIH): 58 mg/dL (ref 0–99)
Triglycerides: 63 mg/dL (ref 0–149)
VLDL Cholesterol Cal: 13 mg/dL (ref 5–40)

## 2024-06-09 LAB — TSH: TSH: 2.12 u[IU]/mL (ref 0.450–4.500)

## 2024-06-09 LAB — MICROALBUMIN / CREATININE URINE RATIO
Creatinine, Urine: 62.7 mg/dL
Microalb/Creat Ratio: 42 mg/g{creat} — ABNORMAL HIGH (ref 0–29)
Microalbumin, Urine: 26.6 ug/mL

## 2024-06-09 LAB — PROTIME-INR
INR: 1.1 (ref 0.9–1.2)
Prothrombin Time: 11.7 s (ref 9.1–12.0)

## 2024-06-10 ENCOUNTER — Ambulatory Visit: Payer: Self-pay | Admitting: Family Medicine

## 2024-06-10 DIAGNOSIS — E1129 Type 2 diabetes mellitus with other diabetic kidney complication: Secondary | ICD-10-CM

## 2024-06-10 MED ORDER — DAPAGLIFLOZIN PROPANEDIOL 10 MG PO TABS: 10.0000 mg | ORAL_TABLET | Freq: Every day | ORAL | 3 refills | Status: AC

## 2024-06-20 ENCOUNTER — Telehealth: Payer: Self-pay | Admitting: *Deleted

## 2024-06-20 NOTE — Telephone Encounter (Signed)
 Copied from CRM #[id number]. Topic: General - Other >> Jun 20, 2024  4:24 PM Leonette P wrote: Reason for CRM: Patient called saying she needs for her MWV to be reported to Medicare.  She said they haven't received it and she wlll not get her 40.00 reported if not reported by the end of the year  Is this something that you send to Medicare.

## 2024-06-24 ENCOUNTER — Other Ambulatory Visit: Payer: Self-pay | Admitting: Family Medicine

## 2024-06-24 DIAGNOSIS — F411 Generalized anxiety disorder: Secondary | ICD-10-CM

## 2024-06-27 ENCOUNTER — Telehealth: Payer: Self-pay | Admitting: Family Medicine

## 2024-06-27 NOTE — Telephone Encounter (Signed)
There is no note attached

## 2024-07-02 ENCOUNTER — Other Ambulatory Visit: Payer: Self-pay | Admitting: Physician Assistant

## 2024-07-05 DIAGNOSIS — M810 Age-related osteoporosis without current pathological fracture: Secondary | ICD-10-CM

## 2024-07-07 ENCOUNTER — Other Ambulatory Visit: Payer: Self-pay | Admitting: Family Medicine

## 2024-07-07 MED ORDER — OZEMPIC (1 MG/DOSE) 4 MG/3ML ~~LOC~~ SOPN: 1.0000 mg | PEN_INJECTOR | SUBCUTANEOUS | 0 refills | Status: AC

## 2024-07-07 NOTE — Telephone Encounter (Signed)
 Copied from CRM [id number]. Topic: Clinical - Medication Refill >> Jul 07, 2024  8:56 AM Victoria A wrote: Medication: Semaglutide , 1 MG/DOSE, (OZEMPIC , 1 MG/DOSE,) 4 MG/3ML SOPN  Has the patient contacted their pharmacy? No (Agent: If no, request that the patient contact the pharmacy for the refill. If patient does not wish to contact the pharmacy document the reason why and proceed with request.) (Agent: If yes, when and what did the pharmacy advise?)  This is the patient's preferred pharmacy:   [facility] - [place], [place] - [address] [place] [address] Phone: [phone] Fax: [phone]  Is this the correct pharmacy for this prescription? Yes If no, delete pharmacy and type the correct one.   Has the prescription been filled recently? No  Is the patient out of the medication? Yes  Has the patient been seen for an appointment in the last year OR does the patient have an upcoming appointment? Yes  Can we respond through MyChart? Yes  Agent: Please be advised that Rx refills may take up to 3 business days. We ask that you follow-up with your pharmacy.

## 2024-07-11 ENCOUNTER — Other Ambulatory Visit (HOSPITAL_COMMUNITY): Payer: Self-pay | Admitting: Family Medicine

## 2024-07-11 ENCOUNTER — Ambulatory Visit: Admitting: Family Medicine

## 2024-07-11 ENCOUNTER — Telehealth (HOSPITAL_COMMUNITY): Payer: Self-pay | Admitting: Pharmacy Technician

## 2024-07-11 ENCOUNTER — Encounter: Payer: Self-pay | Admitting: Family Medicine

## 2024-07-11 VITALS — BP 130/80 | HR 72 | Temp 97.5°F | Ht 62.0 in | Wt 208.6 lb

## 2024-07-11 DIAGNOSIS — R809 Proteinuria, unspecified: Secondary | ICD-10-CM | POA: Diagnosis not present

## 2024-07-11 DIAGNOSIS — M810 Age-related osteoporosis without current pathological fracture: Secondary | ICD-10-CM

## 2024-07-11 DIAGNOSIS — E1129 Type 2 diabetes mellitus with other diabetic kidney complication: Secondary | ICD-10-CM

## 2024-07-11 DIAGNOSIS — N898 Other specified noninflammatory disorders of vagina: Secondary | ICD-10-CM | POA: Diagnosis not present

## 2024-07-11 DIAGNOSIS — R350 Frequency of micturition: Secondary | ICD-10-CM

## 2024-07-11 LAB — POCT URINALYSIS DIP (PROADVANTAGE DEVICE)
Bilirubin, UA: NEGATIVE
Glucose, UA: >=1000 mg/dL — AB
Ketones, POC UA: NEGATIVE mg/dL
Leukocytes, UA: NEGATIVE
Nitrite, UA: NEGATIVE
Protein Ur, POC: NEGATIVE mg/dL
Specific Gravity, Urine: 1.01
Urobilinogen, Ur: 1
pH, UA: 6

## 2024-07-11 NOTE — Patient Instructions (Addendum)
 The plan was only to try the alendronate  for a month, to ensure that you tolerated it prior to switching to the once yearly infusion. Since you had a headache, it is possible that you will have headaches with the infusion.  Not sure how long it may last, but you don't need another treatment for a year.  Your vaginal exam was normal--no abnormal discharge was noted. Your urine showed a little blood, but no infection (could be from irritation). You did have slight redness externally. The farxiga  puts you at potential risk for yeast infections. I recommend using an over-the-counter yeast medication such as Monistat. You can use this externally, where you note the itching, and use just a little internally if you have internal itching, or increasing discharge.  I hope this takes care of your symptoms. If you develop ongoing issues or more side effects, please let us  know. Otherwise, I would recommend continuing with the farxiga , which helps your kidneys (and was started due to increased protein in the urine on your last check).

## 2024-07-11 NOTE — Progress Notes (Signed)
Placed order for reclast

## 2024-07-11 NOTE — Addendum Note (Signed)
 Addended by: VICCI HUSBAND A on: 07/11/2024 02:29 PM   Modules accepted: Orders

## 2024-07-11 NOTE — Telephone Encounter (Signed)
 Auth Submission: NO AUTH NEEDED Site of care: [facility] Payer: [employer] MEDICARE Medication & CPT/J Code(s) submitted: Reclast (Zolendronic acid) [id number] Diagnosis Code: M81.0 Route of submission (phone, fax, portal):  Phone # Fax # Auth type: Buy/Bill HB Units/visits requested: 5mg  x 1 dose, every 12 months Reference number:  Approval from: 07/11/2024 to 10/09/24    Dagoberto Armour, CPhT [facility] Phone: [phone] 07/11/2024

## 2024-07-11 NOTE — Progress Notes (Signed)
 Chief Complaint  Patient presents with   Vaginal Itching    Vaginal itching and discharge, unsure of color (no odor) x 3 days. Frequent urination is only symptom also 3 days.    She notes some external itching and vaginal discharge for about 3 days. Discharge is clear, not thick or white. She denies dysuria, frequency, hematuria.  Sporadic abdominal discomfort, none currently.  She is on ozempic  and farxiga . The farxiga  was started in December due to increasing microalbuminuria.  She expresses concern about the Farxiga  based on what she READ about the side effects.  Hasn't actually experienced any until the last few days.  Lab Results  Component Value Date   HGBA1C 5.4 06/08/2024    She reports she stopped taking alendronate --it caused a headache every time she took it.  PMH, PSH, SH reviewed  Outpatient Encounter Medications as of 07/11/2024  Medication Sig Note   ACCU-CHEK AVIVA PLUS test strip TEST BLOOD SUGAR EVERY DAY    acetaminophen  (TYLENOL ) 500 MG tablet Take 500-1,000 mg by mouth every 6 (six) hours as needed for moderate pain (pain score 4-6) or headache.    albuterol  (PROVENTIL ) (2.5 MG/3ML) 0.083% nebulizer solution Take 3 mLs (2.5 mg total) by nebulization every 6 (six) hours as needed for wheezing or shortness of breath. 09/03/2023: As needed   albuterol  (VENTOLIN  HFA) 108 (90 Base) MCG/ACT inhaler Inhale 2 puffs into the lungs every 6 (six) hours as needed for wheezing or shortness of breath. 09/03/2023: As needed   Alcohol  Swabs  (B-D SINGLE USE SWABS  REGULAR) PADS Use twice a day when checking blood sugars    amoxicillin  (AMOXIL ) 500 MG capsule TAKE 4 CAPSULES BY MOUTH 1 HOUR BEFORE DENTAL PROCEDURE    apixaban  (ELIQUIS ) 2.5 MG TABS tablet Take 1 tablet (2.5 mg total) by mouth 2 (two) times daily.    carvedilol  (COREG ) 6.25 MG tablet TAKE 1 TABLET TWICE DAILY WITH MEALS    cetirizine (ZYRTEC) 10 MG tablet Take 10 mg by mouth daily.    clobetasol cream (TEMOVATE) 0.05 %  Apply 1 Application topically 2 (two) times daily as needed (eczema). 07/11/2024: As needed   dapagliflozin  propanediol (FARXIGA ) 10 MG TABS tablet Take 1 tablet (10 mg total) by mouth daily.    escitalopram  (LEXAPRO ) 5 MG tablet Take 1 tablet (5 mg total) by mouth daily. You can cut back to every other day and taper off as discussed, if doing well    guaiFENesin  (MUCINEX ) 600 MG 12 hr tablet Take 600 mg by mouth at bedtime.    hydrochlorothiazide  (HYDRODIURIL ) 12.5 MG tablet Take 1 tablet (12.5 mg total) by mouth daily.    hydrocortisone cream 1 % Apply 1 Application topically 2 (two) times daily as needed for itching. 06/08/2024: As needed   Lancets Ultra Thin 30G MISC TEST BLOOD SUGAR EVERY DAY 07/11/2024: As needed   montelukast  (SINGULAIR ) 10 MG tablet TAKE 1 TABLET AT BEDTIME    Multiple Vitamins-Minerals (PRESERVISION AREDS 2 PO) Take 1 each by mouth 2 (two) times daily.    omeprazole  (PRILOSEC) 40 MG capsule TAKE 1 CAPSULE TWICE DAILY    Semaglutide , 1 MG/DOSE, (OZEMPIC , 1 MG/DOSE,) 4 MG/3ML SOPN Inject 1 mg into the skin every Friday.    simvastatin  (ZOCOR ) 20 MG tablet TAKE 1 TABLET AT BEDTIME    TRELEGY ELLIPTA  100-62.5-25 MCG/ACT AEPB INHALE 1 PUFF INTO THE LUNGS DAILY.    triamcinolone  cream (KENALOG ) 0.1 % Apply 1 Application topically 2 (two) times daily as needed (eczema/dry skin). 07/11/2024:  As needed   valACYclovir  (VALTREX ) 500 MG tablet TAKE 1 TABLET EVERY DAY    alendronate  (FOSAMAX ) 70 MG tablet TAKE 1 TABLET BY MOUTH EVERY 7 DAYS WITH FULL GLASS OF WATER ON EMPTY STOMACH. DO NOT RECLINE OR EAT FOR 30 MINUTES (Patient not taking: Reported on 07/11/2024)    No facility-administered encounter medications on file as of 07/11/2024.   Allergies  Allergen Reactions   Contrast Media [Iodinated Contrast Media] Anaphylaxis, Shortness Of Breath and Other (See Comments)    Could not breath   Iohexol  Anaphylaxis, Shortness Of Breath and Other (See Comments)    Immediately could not  breathe   Lisinopril Anaphylaxis, Shortness Of Breath and Rash   Sulfa Antibiotics Anaphylaxis and Other (See Comments)    Historical from mother, pt states that mother says she almost died from this drug   Latex Other (See Comments)    Unless against on skin for a long time, blisters. Short term is okay.   Codeine Nausea Only, Anxiety and Other (See Comments)    insomnia   Levofloxacin Other (See Comments)    insomnia   Lipitor [Atorvastatin Calcium ] Rash   Meloxicam Rash    Broke out in a rash on her stomach,back, legs, and behind ears.   Nickel Other (See Comments)    With earrings pt has soreness and drainage from piercing   Vicodin [Hydrocodone -Acetaminophen ] Itching    ROS: no f/c, URI symptoms, n/v/d. Vaginal discharge per HPI. No urinary complaints. No rashes. No CP, SOB or other concerns. Back pain when laying flat    PHYSICAL EXAM:  BP 130/80   Pulse 72   Temp (!) 97.5 F (36.4 C) (Tympanic)   Ht 5' 2 (1.575 m)   Wt 208 lb 9.6 oz (94.6 kg)   LMP  (LMP Unknown)   BMI 38.15 kg/m   Wt Readings from Last 3 Encounters:  07/11/24 208 lb 9.6 oz (94.6 kg)  06/08/24 214 lb (97.1 kg)  04/04/24 205 lb 8 oz (93.2 kg)   Pleasant, elderly, obese female in no distress Heart: regular rate and rhythm, +murmur Lungs: clear bilaterally Back: no CVA tenderness Abdomen:  soft, protuberant, nontender GU: some mild erythema diffusely, with some white material in the crevices.  Skin is intact, no specific rash or lesions.  Speculum exam--there is just a very small amount of thin, slightly white/clear discharge, no odor.  Atrophic changes are noted. Psych: normal mood, affect, hygiene and grooming Neuro: alert and oriented.  Grossly normal strength and CN.  Ambulates with walker.  Urine dip: +glu, mod blood, negative nitrite and leuks   ASSESSMENT/PLAN:  Vaginal itching - patient on farxiga , at risk for yeast infection.  Exam not c/w vaginitis (no internal discharge), poss  mild external yeast infection. Treat topically (OTC)  Urinary frequency - no e/o infection - Plan: POCT Urinalysis DIP (Proadvantage Device)  Osteoporosis without current pathological fracture, unspecified osteoporosis type - some HA from alendronate , otherwise tolerated will. Willing to try Reclast infusion (as per original plan)  Controlled type 2 diabetes mellitus with microalbuminuria (HCC) - farxiga  was recently added due to microalbuminuria. If she has persistent issues, can lower dose or stop (not needed for DM control), continue now  Proceed with IV reclast infusion Tolerated alendronate  except for headaches. She is aware of the potential for headaches after the infusion. She said they were annoying, not terrible, didn't require any medication.

## 2024-07-19 ENCOUNTER — Ambulatory Visit (HOSPITAL_COMMUNITY)
Admission: RE | Admit: 2024-07-19 | Discharge: 2024-07-19 | Disposition: A | Discharge status: Home / Self Care | Source: Ambulatory Visit | Attending: Family Medicine | Admitting: Family Medicine

## 2024-07-19 VITALS — BP 141/58 | HR 71 | Temp 97.7°F | Resp 17

## 2024-07-19 DIAGNOSIS — M810 Age-related osteoporosis without current pathological fracture: Secondary | ICD-10-CM | POA: Diagnosis present

## 2024-07-19 MED ORDER — DIPHENHYDRAMINE HCL 25 MG PO CAPS
ORAL_CAPSULE | ORAL | Status: AC
  Filled 2024-07-19: qty 1

## 2024-07-19 MED ORDER — ACETAMINOPHEN 325 MG PO TABS
ORAL_TABLET | ORAL | Status: AC
  Filled 2024-07-19: qty 2

## 2024-07-19 MED ORDER — ACETAMINOPHEN 325 MG PO TABS
650.0000 mg | ORAL_TABLET | Freq: Once | ORAL | Status: AC
  Administered 2024-07-19: 650 mg via ORAL

## 2024-07-19 MED ORDER — ZOLEDRONIC ACID 5 MG/100ML IV SOLN
INTRAVENOUS | Status: AC
  Filled 2024-07-19: qty 100

## 2024-07-19 MED ORDER — DIPHENHYDRAMINE HCL 25 MG PO CAPS
25.0000 mg | ORAL_CAPSULE | Freq: Once | ORAL | Status: AC
  Administered 2024-07-19: 25 mg via ORAL

## 2024-07-19 MED ORDER — SODIUM CHLORIDE 0.9 % IV SOLN: INTRAVENOUS | Status: DC

## 2024-07-19 MED ORDER — ZOLEDRONIC ACID 5 MG/100ML IV SOLN
5.0000 mg | Freq: Once | INTRAVENOUS | Status: AC
  Administered 2024-07-19: 5 mg via INTRAVENOUS

## 2024-07-21 ENCOUNTER — Telehealth: Payer: Self-pay

## 2024-07-21 NOTE — Progress Notes (Addendum)
" ° °  07/21/2024  Patient ID: Shannon Schultz, female   DOB: February 25, 1946, 79 y.o.   MRN: [id number]  Attempted to contact patient to ensure she was getting Ozempic  with no issues at pharmacy since the Nov[employer] PAP program ended. She reports she received fill with no issues at this time.   Jon VEAR Lindau, PharmD Clinical Pharmacist [phone]  "

## 2024-07-25 NOTE — Telephone Encounter (Signed)
 PAP ozempic  ended & pt is getting from pharmacy

## 2024-07-28 ENCOUNTER — Encounter: Payer: Self-pay | Admitting: Acute Care

## 2024-08-15 ENCOUNTER — Ambulatory Visit (HOSPITAL_COMMUNITY)

## 2024-08-30 ENCOUNTER — Ambulatory Visit: Admitting: Nurse Practitioner

## 2024-09-15 ENCOUNTER — Encounter (HOSPITAL_COMMUNITY)

## 2024-12-07 ENCOUNTER — Ambulatory Visit: Admitting: Family Medicine

## 2025-04-06 ENCOUNTER — Other Ambulatory Visit

## 2025-04-06 ENCOUNTER — Ambulatory Visit: Admitting: Hematology and Oncology

## 2025-06-14 ENCOUNTER — Encounter: Admitting: Family Medicine
